# Patient Record
Sex: Male | Born: 1935
Health system: Southern US, Community
[De-identification: ages and names within clinical notes are randomized; demographics above are authoritative.]

## PROBLEM LIST (undated history)

## (undated) DIAGNOSIS — Z5189 Encounter for other specified aftercare: Secondary | ICD-10-CM

## (undated) DIAGNOSIS — K5790 Diverticulosis of intestine, part unspecified, without perforation or abscess without bleeding: Secondary | ICD-10-CM

## (undated) DIAGNOSIS — M1990 Unspecified osteoarthritis, unspecified site: Secondary | ICD-10-CM

## (undated) DIAGNOSIS — I10 Essential (primary) hypertension: Secondary | ICD-10-CM

## (undated) DIAGNOSIS — G20 Parkinson's disease: Secondary | ICD-10-CM

## (undated) DIAGNOSIS — K819 Cholecystitis, unspecified: Secondary | ICD-10-CM

## (undated) DIAGNOSIS — R600 Localized edema: Secondary | ICD-10-CM

## (undated) DIAGNOSIS — I2510 Atherosclerotic heart disease of native coronary artery without angina pectoris: Secondary | ICD-10-CM

## (undated) DIAGNOSIS — K319 Disease of stomach and duodenum, unspecified: Secondary | ICD-10-CM

## (undated) DIAGNOSIS — K219 Gastro-esophageal reflux disease without esophagitis: Secondary | ICD-10-CM

## (undated) DIAGNOSIS — R011 Cardiac murmur, unspecified: Secondary | ICD-10-CM

## (undated) DIAGNOSIS — T4145XA Adverse effect of unspecified anesthetic, initial encounter: Secondary | ICD-10-CM

## (undated) DIAGNOSIS — G709 Myoneural disorder, unspecified: Secondary | ICD-10-CM

## (undated) DIAGNOSIS — Z9861 Coronary angioplasty status: Secondary | ICD-10-CM

## (undated) DIAGNOSIS — R079 Chest pain, unspecified: Secondary | ICD-10-CM

## (undated) DIAGNOSIS — E785 Hyperlipidemia, unspecified: Secondary | ICD-10-CM

## (undated) DIAGNOSIS — K922 Gastrointestinal hemorrhage, unspecified: Secondary | ICD-10-CM

## (undated) DIAGNOSIS — K635 Polyp of colon: Secondary | ICD-10-CM

## (undated) DIAGNOSIS — Z9289 Personal history of other medical treatment: Secondary | ICD-10-CM

## (undated) DIAGNOSIS — D649 Anemia, unspecified: Secondary | ICD-10-CM

## (undated) DIAGNOSIS — IMO0001 Reserved for inherently not codable concepts without codable children: Secondary | ICD-10-CM

## (undated) DIAGNOSIS — N200 Calculus of kidney: Secondary | ICD-10-CM

## (undated) DIAGNOSIS — T8859XA Other complications of anesthesia, initial encounter: Secondary | ICD-10-CM

## (undated) DIAGNOSIS — R55 Syncope and collapse: Secondary | ICD-10-CM

## (undated) DIAGNOSIS — I219 Acute myocardial infarction, unspecified: Secondary | ICD-10-CM

## (undated) DIAGNOSIS — F419 Anxiety disorder, unspecified: Secondary | ICD-10-CM

## (undated) DIAGNOSIS — D126 Benign neoplasm of colon, unspecified: Secondary | ICD-10-CM

## (undated) DIAGNOSIS — G8929 Other chronic pain: Secondary | ICD-10-CM

## (undated) DIAGNOSIS — Z8619 Personal history of other infectious and parasitic diseases: Secondary | ICD-10-CM

## (undated) DIAGNOSIS — M549 Dorsalgia, unspecified: Secondary | ICD-10-CM

## (undated) DIAGNOSIS — R197 Diarrhea, unspecified: Secondary | ICD-10-CM

## (undated) DIAGNOSIS — G20C Parkinsonism, unspecified: Secondary | ICD-10-CM

## (undated) DIAGNOSIS — N189 Chronic kidney disease, unspecified: Secondary | ICD-10-CM

## (undated) HISTORY — DX: Cholecystitis, unspecified: K81.9

## (undated) HISTORY — DX: Unspecified osteoarthritis, unspecified site: M19.90

## (undated) HISTORY — DX: Acute myocardial infarction, unspecified: I21.9

## (undated) HISTORY — PX: COLON SURGERY: SHX602

## (undated) HISTORY — DX: Localized edema: R60.0

## (undated) HISTORY — DX: Personal history of other medical treatment: Z92.89

## (undated) HISTORY — DX: Hyperlipidemia, unspecified: E78.5

## (undated) HISTORY — DX: Parkinsonism, unspecified: G20.C

## (undated) HISTORY — DX: Benign neoplasm of colon, unspecified: D12.6

## (undated) HISTORY — DX: Parkinson's disease: G20

## (undated) HISTORY — DX: Personal history of other infectious and parasitic diseases: Z86.19

## (undated) HISTORY — DX: Diarrhea, unspecified: R19.7

## (undated) HISTORY — DX: Chest pain, unspecified: R07.9

## (undated) HISTORY — DX: Polyp of colon: K63.5

## (undated) HISTORY — DX: Chronic kidney disease, unspecified: N18.9

## (undated) HISTORY — DX: Calculus of kidney: N20.0

## (undated) HISTORY — DX: Disease of stomach and duodenum, unspecified: K31.9

## (undated) HISTORY — DX: Essential (primary) hypertension: I10

## (undated) HISTORY — DX: Diverticulosis of intestine, part unspecified, without perforation or abscess without bleeding: K57.90

---
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
## 1995-02-12 HISTORY — PX: BACK SURGERY: SHX140

## 1995-08-12 HISTORY — PX: POSTERIOR FUSION LUMBAR SPINE: SUR632

## 2000-05-13 DIAGNOSIS — C4492 Squamous cell carcinoma of skin, unspecified: Secondary | ICD-10-CM

## 2000-05-13 HISTORY — DX: Squamous cell carcinoma of skin, unspecified: C44.92

## 2002-09-20 ENCOUNTER — Inpatient Hospital Stay (HOSPITAL_COMMUNITY): Admission: EM | Admit: 2002-09-20 | Discharge: 2002-09-23 | Payer: Self-pay | Admitting: Internal Medicine

## 2002-09-20 ENCOUNTER — Encounter: Payer: Self-pay | Admitting: Internal Medicine

## 2002-10-15 ENCOUNTER — Encounter (INDEPENDENT_AMBULATORY_CARE_PROVIDER_SITE_OTHER): Payer: Self-pay | Admitting: *Deleted

## 2003-06-21 DIAGNOSIS — D229 Melanocytic nevi, unspecified: Secondary | ICD-10-CM

## 2003-06-21 HISTORY — DX: Melanocytic nevi, unspecified: D22.9

## 2003-09-05 ENCOUNTER — Ambulatory Visit (HOSPITAL_COMMUNITY): Admission: RE | Admit: 2003-09-05 | Discharge: 2003-09-05 | Payer: Self-pay | Admitting: Internal Medicine

## 2004-10-01 ENCOUNTER — Encounter: Payer: Self-pay | Admitting: Emergency Medicine

## 2004-10-02 ENCOUNTER — Encounter (INDEPENDENT_AMBULATORY_CARE_PROVIDER_SITE_OTHER): Payer: Self-pay | Admitting: Cardiology

## 2004-10-02 ENCOUNTER — Inpatient Hospital Stay (HOSPITAL_COMMUNITY): Admission: AD | Admit: 2004-10-02 | Discharge: 2004-10-03 | Payer: Self-pay | Admitting: *Deleted

## 2004-10-07 ENCOUNTER — Emergency Department (HOSPITAL_COMMUNITY): Admission: EM | Admit: 2004-10-07 | Discharge: 2004-10-07 | Payer: Self-pay | Admitting: Emergency Medicine

## 2004-11-14 ENCOUNTER — Inpatient Hospital Stay (HOSPITAL_COMMUNITY): Admission: EM | Admit: 2004-11-14 | Discharge: 2004-11-15 | Payer: Self-pay | Admitting: Emergency Medicine

## 2005-12-03 ENCOUNTER — Ambulatory Visit (HOSPITAL_COMMUNITY): Admission: RE | Admit: 2005-12-03 | Discharge: 2005-12-03 | Payer: Self-pay | Admitting: Ophthalmology

## 2005-12-05 ENCOUNTER — Inpatient Hospital Stay (HOSPITAL_COMMUNITY): Admission: EM | Admit: 2005-12-05 | Discharge: 2005-12-06 | Payer: Self-pay | Admitting: Emergency Medicine

## 2008-01-28 ENCOUNTER — Emergency Department (HOSPITAL_COMMUNITY): Admission: EM | Admit: 2008-01-28 | Discharge: 2008-01-28 | Payer: Self-pay | Admitting: Emergency Medicine

## 2008-10-31 ENCOUNTER — Ambulatory Visit: Payer: Self-pay | Admitting: Gastroenterology

## 2008-10-31 DIAGNOSIS — K5731 Diverticulosis of large intestine without perforation or abscess with bleeding: Secondary | ICD-10-CM

## 2008-10-31 DIAGNOSIS — Z860100 Personal history of colon polyps, unspecified: Secondary | ICD-10-CM | POA: Insufficient documentation

## 2008-10-31 DIAGNOSIS — K5730 Diverticulosis of large intestine without perforation or abscess without bleeding: Secondary | ICD-10-CM | POA: Insufficient documentation

## 2008-11-04 ENCOUNTER — Telehealth: Payer: Self-pay | Admitting: Gastroenterology

## 2008-11-04 ENCOUNTER — Encounter: Payer: Self-pay | Admitting: Gastroenterology

## 2008-11-04 ENCOUNTER — Ambulatory Visit: Payer: Self-pay | Admitting: Gastroenterology

## 2008-11-04 LAB — CONVERTED CEMR LAB: Fecal Occult Bld: NEGATIVE

## 2008-11-07 ENCOUNTER — Encounter: Payer: Self-pay | Admitting: Gastroenterology

## 2008-11-08 ENCOUNTER — Encounter: Payer: Self-pay | Admitting: Gastroenterology

## 2008-11-10 LAB — HM COLONOSCOPY

## 2009-01-05 ENCOUNTER — Inpatient Hospital Stay (HOSPITAL_COMMUNITY): Admission: EM | Admit: 2009-01-05 | Discharge: 2009-01-08 | Payer: Self-pay | Admitting: Emergency Medicine

## 2009-01-05 ENCOUNTER — Ambulatory Visit: Payer: Self-pay | Admitting: Internal Medicine

## 2009-01-10 ENCOUNTER — Ambulatory Visit: Payer: Self-pay | Admitting: Physician Assistant

## 2009-01-10 ENCOUNTER — Encounter: Payer: Self-pay | Admitting: Gastroenterology

## 2009-01-10 LAB — CONVERTED CEMR LAB
Basophils Relative: 0.9 % (ref 0.0–3.0)
Eosinophils Absolute: 0.3 10*3/uL (ref 0.0–0.7)
Hemoglobin: 9.2 g/dL — ABNORMAL LOW (ref 13.0–17.0)
Lymphocytes Relative: 13.1 % (ref 12.0–46.0)
MCHC: 33.9 g/dL (ref 30.0–36.0)
Monocytes Relative: 5.9 % (ref 3.0–12.0)
Neutro Abs: 7.6 10*3/uL (ref 1.4–7.7)
Neutrophils Relative %: 76.7 % (ref 43.0–77.0)
RBC: 2.98 M/uL — ABNORMAL LOW (ref 4.22–5.81)
WBC: 9.9 10*3/uL (ref 4.5–10.5)

## 2009-02-06 ENCOUNTER — Ambulatory Visit: Payer: Self-pay | Admitting: Gastroenterology

## 2009-02-07 LAB — CONVERTED CEMR LAB
Basophils Relative: 1 % (ref 0.0–3.0)
HCT: 36.7 % — ABNORMAL LOW (ref 39.0–52.0)
Hemoglobin: 12.3 g/dL — ABNORMAL LOW (ref 13.0–17.0)
Lymphocytes Relative: 11.9 % — ABNORMAL LOW (ref 12.0–46.0)
Lymphs Abs: 1.1 10*3/uL (ref 0.7–4.0)
MCHC: 33.6 g/dL (ref 30.0–36.0)
Monocytes Relative: 6.9 % (ref 3.0–12.0)
Neutro Abs: 7.4 10*3/uL (ref 1.4–7.7)
RBC: 4.08 M/uL — ABNORMAL LOW (ref 4.22–5.81)

## 2009-02-11 DIAGNOSIS — Z9861 Coronary angioplasty status: Secondary | ICD-10-CM

## 2009-02-11 HISTORY — DX: Coronary angioplasty status: Z98.61

## 2009-06-07 ENCOUNTER — Ambulatory Visit: Payer: Self-pay | Admitting: Internal Medicine

## 2009-06-07 ENCOUNTER — Inpatient Hospital Stay (HOSPITAL_COMMUNITY): Admission: AD | Admit: 2009-06-07 | Discharge: 2009-06-08 | Payer: Self-pay | Admitting: Gastroenterology

## 2009-06-07 ENCOUNTER — Telehealth: Payer: Self-pay | Admitting: Gastroenterology

## 2009-06-07 DIAGNOSIS — K625 Hemorrhage of anus and rectum: Secondary | ICD-10-CM | POA: Insufficient documentation

## 2009-06-07 DIAGNOSIS — I10 Essential (primary) hypertension: Secondary | ICD-10-CM | POA: Insufficient documentation

## 2009-07-06 ENCOUNTER — Ambulatory Visit: Payer: Self-pay | Admitting: Gastroenterology

## 2009-07-12 DIAGNOSIS — I219 Acute myocardial infarction, unspecified: Secondary | ICD-10-CM

## 2009-07-12 HISTORY — DX: Acute myocardial infarction, unspecified: I21.9

## 2009-08-09 ENCOUNTER — Inpatient Hospital Stay (HOSPITAL_COMMUNITY): Admission: EM | Admit: 2009-08-09 | Discharge: 2009-08-12 | Payer: Self-pay | Admitting: Emergency Medicine

## 2009-08-09 HISTORY — PX: CARDIAC CATHETERIZATION: SHX172

## 2009-08-19 ENCOUNTER — Ambulatory Visit: Payer: Self-pay | Admitting: Gastroenterology

## 2009-08-19 ENCOUNTER — Inpatient Hospital Stay (HOSPITAL_COMMUNITY): Admission: EM | Admit: 2009-08-19 | Discharge: 2009-08-20 | Payer: Self-pay | Admitting: Emergency Medicine

## 2009-08-21 ENCOUNTER — Inpatient Hospital Stay (HOSPITAL_COMMUNITY): Admission: EM | Admit: 2009-08-21 | Discharge: 2009-08-26 | Payer: Self-pay | Admitting: Emergency Medicine

## 2009-08-26 ENCOUNTER — Encounter (INDEPENDENT_AMBULATORY_CARE_PROVIDER_SITE_OTHER): Payer: Self-pay | Admitting: *Deleted

## 2009-08-28 ENCOUNTER — Encounter: Payer: Self-pay | Admitting: Gastroenterology

## 2009-08-28 ENCOUNTER — Telehealth: Payer: Self-pay | Admitting: Physician Assistant

## 2009-08-30 ENCOUNTER — Encounter: Admission: RE | Admit: 2009-08-30 | Discharge: 2009-08-30 | Payer: Self-pay | Admitting: Internal Medicine

## 2009-09-05 ENCOUNTER — Telehealth: Payer: Self-pay | Admitting: Gastroenterology

## 2009-09-14 ENCOUNTER — Encounter (HOSPITAL_COMMUNITY): Admission: RE | Admit: 2009-09-14 | Discharge: 2009-11-10 | Payer: Self-pay | Admitting: Cardiology

## 2009-10-26 ENCOUNTER — Ambulatory Visit: Payer: Self-pay | Admitting: Gastroenterology

## 2009-10-26 DIAGNOSIS — I2510 Atherosclerotic heart disease of native coronary artery without angina pectoris: Secondary | ICD-10-CM

## 2009-10-30 ENCOUNTER — Encounter: Payer: Self-pay | Admitting: Gastroenterology

## 2009-11-11 ENCOUNTER — Encounter (HOSPITAL_COMMUNITY)
Admission: RE | Admit: 2009-11-11 | Discharge: 2010-01-19 | Payer: Self-pay | Source: Home / Self Care | Attending: Cardiology | Admitting: Cardiology

## 2009-11-27 ENCOUNTER — Emergency Department (HOSPITAL_COMMUNITY): Admission: EM | Admit: 2009-11-27 | Discharge: 2009-11-27 | Payer: Self-pay | Admitting: Emergency Medicine

## 2009-12-04 ENCOUNTER — Inpatient Hospital Stay (HOSPITAL_COMMUNITY): Admission: EM | Admit: 2009-12-04 | Discharge: 2009-12-07 | Payer: Self-pay | Admitting: Emergency Medicine

## 2009-12-22 ENCOUNTER — Encounter: Payer: Self-pay | Admitting: Gastroenterology

## 2010-01-15 ENCOUNTER — Encounter: Payer: Self-pay | Admitting: Gastroenterology

## 2010-01-19 ENCOUNTER — Encounter
Admission: RE | Admit: 2010-01-19 | Discharge: 2010-01-19 | Payer: Self-pay | Source: Home / Self Care | Attending: General Surgery | Admitting: General Surgery

## 2010-01-24 ENCOUNTER — Inpatient Hospital Stay (HOSPITAL_COMMUNITY)
Admission: RE | Admit: 2010-01-24 | Discharge: 2010-01-31 | Payer: Self-pay | Source: Home / Self Care | Attending: General Surgery | Admitting: General Surgery

## 2010-01-24 ENCOUNTER — Encounter (INDEPENDENT_AMBULATORY_CARE_PROVIDER_SITE_OTHER): Payer: Self-pay | Admitting: General Surgery

## 2010-01-24 HISTORY — PX: SUBTOTAL COLECTOMY: SHX855

## 2010-02-09 ENCOUNTER — Encounter: Payer: Self-pay | Admitting: Gastroenterology

## 2010-03-09 ENCOUNTER — Encounter
Admission: RE | Admit: 2010-03-09 | Discharge: 2010-03-09 | Payer: Self-pay | Source: Home / Self Care | Attending: Cardiology | Admitting: Cardiology

## 2010-03-12 ENCOUNTER — Encounter: Payer: Self-pay | Admitting: Gastroenterology

## 2010-03-13 NOTE — Letter (Signed)
Summary: Out of Work  [facility]  [address] [place], [place] [address]   Phone: [phone]  Fax: [phone]    August 28, 2009   Employee:  RISHARD GOTTSCH Sp[employer]fore    To Whom It May Concern:   For Medical reasons, please excuse the above named employee from work for the following dates:  First admission to hospital: 08-19-09 and 08-20-09 Second admission to hospital: 08-21-09  thru and including 08-26-09.  Mr. Hovey may return to work on 09-04-2009.       If you need additional information, please feel free to contact our office.         Sincerely,       Dr. Herbie Baltimore Kaplan/ Amy Esterwood PA-C

## 2010-03-13 NOTE — Letter (Signed)
Summary: Gary [name]   Imported By: Bubba Hales 01/01/2010 08:32:45  _____________________________________________________________________  External Attachment:    Type:   Image     Comment:   External Document

## 2010-03-13 NOTE — Letter (Signed)
Summary: Results Letter  [facility]  [place], [place] [address]   Phone: [phone]  Fax: [phone]        November 07, 2008 MRN: [id number]    [place], [place]  [address]    Dear Mr. GRIFFO,  Your hemeoccult cards testing for microscopic bleeding were negative.  No further GI workup is required at this time.   Please continue with the recommendations that we previously discussed. Should you have any further questions or concerns, feel free to contact me.    Sincerely,  Sandy Salaam. Deatra Ina, M.D., [facility]         Sincerely,  Inda Castle MD  This letter has been electronically signed by your physician.  Appended Document: Results Letter letter mailed

## 2010-03-13 NOTE — Assessment & Plan Note (Signed)
Summary: hosp. f/u--ch.   History of Present Illness Visit Type: Follow-up Visit Primary GI MD: Erskine Emery MD [facility] Primary Provider: Levin Erp, MD Requesting Provider: n/a Chief Complaint: Hosp f/u. Pt denies any GI complaints History of Present Illness:   Mr. Diberardino has returned following hospitalization for limited lower GI bleed.  Although he has diffuse diverticulosis it was not certain whether he bled from a diverticulum since he appeared to have limited very distal rectal bleeding.  He currently is feeling well and is without GI complaints.  With daily Metamucil he is having regular bowel movements.   GI Review of Systems      Denies abdominal pain, acid reflux, belching, bloating, chest pain, dysphagia with liquids, dysphagia with solids, heartburn, loss of appetite, nausea, vomiting, vomiting blood, weight loss, and  weight gain.        Denies anal fissure, black tarry stools, change in bowel habit, constipation, diarrhea, diverticulosis, fecal incontinence, heme positive stool, hemorrhoids, irritable bowel syndrome, jaundice, light color stool, liver problems, rectal bleeding, and  rectal pain.    Current Medications (verified): 1)  Doxazosin Mesylate 8 Mg Tabs (Doxazosin Mesylate) .... Once Daily 2)  Fexofenadine Hcl 180 Mg Tabs (Fexofenadine Hcl) .... Once Daily 3)  Lisinopril-Hydrochlorothiazide 10-12.5 Mg Tabs (Lisinopril-Hydrochlorothiazide) .... Once Daily 4)  Flonase 50 Mcg/act Susp (Fluticasone Propionate) .... 2 Puffs Daily 5)  Gabapentin 400 Mg Caps (Gabapentin) .... Three Times A Day 6)  Tramadol-Acetaminophen 37.5-325 Mg Tabs (Tramadol-Acetaminophen) .... Take 2 Tablets Three Times A Day 7)  Tylenol Extra Strength 500 Mg Tabs (Acetaminophen) .... As Needed 8)  Androgel 25 Mg/2.5gm Gel (Testosterone) .... Once Daily 9)  Metamucil 30.9 % Powd (Psyllium) .... Two Times A Day  Allergies (verified): 1)  ! Naprosyn  Past History:  Past Medical  History: Reviewed history from 06/07/2009 and no changes required. Arthritis Adenomatous Colon Polyps-LAST COLON 9/10 Diverticulosis-DIFFUSE,WITH RECURRENT DIVERTICULAR BLEEDING Hypertension Kidney Stones Urinary Tract Infection  Past Surgical History: Reviewed history from 10/31/2008 and no changes required. Back Surgery Eye Surgery(corrective @ age 50)  Family History: Reviewed history from 10/31/2008 and no changes required. No FH of Colon Cancer: Family History of Pancreatic Cancer:Mother  Social History: Reviewed history from 06/07/2009 and no changes required. 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  Alcohol Use - no Daily Caffeine Use Illicit Drug Use - no  Review of Systems       The patient complains of allergy/sinus.  The patient denies anemia, anxiety-new, arthritis/joint pain, back pain, blood in urine, breast changes/lumps, change in vision, confusion, cough, coughing up blood, depression-new, fainting, fatigue, fever, headaches-new, hearing problems, heart murmur, heart rhythm changes, itching, muscle pains/cramps, night sweats, nosebleeds, shortness of breath, skin rash, sleeping problems, sore throat, swelling of feet/legs, swollen lymph glands, thirst - excessive, urination - excessive, urination changes/pain, urine leakage, vision changes, and voice change.    Vital Signs:  Patient profile:   77 year old male Height:      69 inches Weight:      205 pounds BMI:     30.38 BSA:     2.09 Pulse rate:   84 / minute Pulse rhythm:   regular BP sitting:   132 / 64  (left arm) Cuff size:   regular  Vitals Entered By: Hope Pigeon CMA (Jul 06, 2009 10:11 AM)   Impression & Recommendations:  Problem # 1:  RECTAL BLEEDING (ICD-569.3) He may have had a very limited diverticular bleed.  Alternatively, this  may have been  due to hemorrhoids.  He  does have a history of diverticular bleeding in November, 2010 and 2004.  Recommendations #1 continue  fiber supplementation #2 elective subtotal colectomy has been discussed as a possibility if he has documented recurrent diverticula bleeding.  This point therapy will be expectant  Patient Instructions: 1)  Copy sent to : Christean Grief Green,MD 2)  The medication list was reviewed and reconciled.  All changed / newly prescribed medications were explained.  A complete medication list was provided to the patient / caregiver.

## 2010-03-13 NOTE — Assessment & Plan Note (Signed)
Summary: F/U--CH.   History of Present Illness Visit Type: Follow-up Visit Primary GI MD: Erskine Emery MD [facility] Primary Provider: Levin Erp, MD Requesting Provider: n/a Chief Complaint: F/u for diverticular bleed.  Pt denies any GI complaints  History of Present Illness:   Mr. Blount has returned for followup of his lower GI bleed.  His last admission in July for acute diverticular bleedingbleeding  Iin July was complicated by a myocardial infarction.  He has severe diverticular disease in the sigmoid and descending colon.  Since discharge he has felt well and had no further GI events.  He is undergoing cardiac rehab.   He has no specific complaints.   GI Review of Systems      Denies abdominal pain, acid reflux, belching, bloating, chest pain, dysphagia with liquids, dysphagia with solids, heartburn, loss of appetite, nausea, vomiting, vomiting blood, weight loss, and  weight gain.      Reports diverticulosis.     Denies anal fissure, black tarry stools, change in bowel habit, constipation, diarrhea, fecal incontinence, heme positive stool, hemorrhoids, irritable bowel syndrome, jaundice, light color stool, liver problems, rectal bleeding, and  rectal pain.    Current Medications (verified): 1)  Doxazosin Mesylate 8 Mg Tabs (Doxazosin Mesylate) .... Once Daily 2)  Fexofenadine Hcl 180 Mg Tabs (Fexofenadine Hcl) .... Once Daily 3)  Lisinopril-Hydrochlorothiazide 10-12.5 Mg Tabs (Lisinopril-Hydrochlorothiazide) .... Once Daily 4)  Flonase 50 Mcg/act Susp (Fluticasone Propionate) .... 2 Puffs Daily 5)  Gabapentin 400 Mg Caps (Gabapentin) .... Three Times A Day 6)  Tramadol-Acetaminophen 37.5-325 Mg Tabs (Tramadol-Acetaminophen) .... Take 2 Tablets Three Times A Day 7)  Tylenol Extra Strength 500 Mg Tabs (Acetaminophen) .... As Needed 8)  Benefiber  Powd (Wheat Dextrin) .... Once Daily May Use Two Times A Day 9)  Ferrous Sulfate 325 (65 Fe) Mg Tabs (Ferrous Sulfate) .... One Tablet By  Mouth Once Daily 10)  Lorazepam 0.5 Mg Tabs (Lorazepam) .... Two Times A Day As Needed 11)  Hydrocodone-Acetaminophen 5-325 Mg Tabs (Hydrocodone-Acetaminophen) .... Every 6 Hours As Needed 12)  Simvastatin 40 Mg Tabs (Simvastatin) .... One Tablet By Mouth Once Daily 13)  Nitrostat 0.4 Mg Subl (Nitroglycerin) .... As Needed  Allergies (verified): 1)  ! Naprosyn 2)  ! Sulfa 3)  ! * Plavix  Past History:  Past Medical History: Arthritis Adenomatous Colon Polyps-LAST COLON 9/10 Diverticulosis-DIFFUSE,WITH RECURRENT DIVERTICULAR BLEEDING Hypertension Kidney Stones Urinary Tract Infection Myocardial Infarction--June 2011  Chronic Kidney Disease  Dyslipedemia   Past Surgical History: Reviewed history from 10/31/2008 and no changes required. Back Surgery Eye Surgery(corrective @ age 21)  Family History: Reviewed history from 10/31/2008 and no changes required. No FH of Colon Cancer: Family History of Pancreatic Cancer:Mother  Social History: Reviewed history from 06/07/2009 and no changes required. 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  Alcohol Use - no Daily Caffeine Use Illicit Drug Use - no  Review of Systems       The patient complains of fatigue.  The patient denies allergy/sinus, anemia, anxiety-new, arthritis/joint pain, back pain, blood in urine, breast changes/lumps, change in vision, confusion, cough, coughing up blood, depression-new, fainting, fever, headaches-new, hearing problems, heart murmur, heart rhythm changes, itching, muscle pains/cramps, night sweats, nosebleeds, shortness of breath, skin rash, sleeping problems, sore throat, swelling of feet/legs, swollen lymph glands, thirst - excessive, urination - excessive, urination changes/pain, urine leakage, vision changes, and voice change.         All other systems were reviewed and were negative   Vital Signs:  Patient profile:   77 year old male Height:      69 inches Weight:      206  pounds BMI:     30.53 BSA:     2.09 Pulse rate:   68 / minute Pulse rhythm:   regular BP sitting:   124 / 68  (left arm) Cuff size:   regular  Vitals Entered By: Hope Pigeon CMA (October 26, 2009 8:49 AM)   Impression & Recommendations:  Problem # 1:  Hx of DIVERTICULOSIS, COLON, WITH HEMORRHAGE (ICD-562.12)  He has had 3 diverticular bleeds in the last 6 months.  Elective left subtotal colectomy has been discussed, deferred because of acute MI in July, 2011.  Recommendations #1 referred to general surgery, Dr. Fanny Skates, for consideration of left segmental colectomy next 2-3 months  Orders: [facility] ([facility])  Problem # 2:  CAD (ICD-414.00) Status post MI in July, 2011.  He has had stable cardiac function since that time  Problem # 3:  PERSONAL HISTORY OF COLONIC POLYPS (ICD-V12.72) Last colonoscopy 2010 demonstrating an adenomatous polyp.  Then follow up colonoscopy in 2015.  Patient Instructions: 1)  Copy sent to : Levin Erp, MD, Fanny Skates, MD 2)  Your appointment with Dr Dalbert Batman is scheduled on 10/30/2009 at 2:30pm 3)  The medication list was reviewed and reconciled.  All changed / newly prescribed medications were explained.  A complete medication list was provided to the patient / caregiver.

## 2010-03-13 NOTE — Progress Notes (Signed)
Summary: Condition Update  Phone Note Call from Patient Call back at [phone]   Caller: Patient Call For: Dr. Deatra Ina Reason for Call: Talk to Nurse Summary of Call: pt. went to his PCP last week and his hemoglobin was 11.5.Marland KitchenMarland KitchenMarland Kitchenwould like to discuss Initial call taken by: Webb Laws,  September 05, 2009 10:48 AM  Follow-up for Phone Call        Pt. wants to update Dr.Kaplan on his most recent lab.  Pt. feels well and is back to work.  No c/o.  Pt. wants to know how often he should get his labs checked? Follow-up by: Vivia Ewing LPN,  July 26, 624THL [id number] AM  Additional Follow-up for Phone Call Additional follow up Details #1::        He does not need it routinely in the absence of overt bleeding Additional Follow-up by: Inda Castle MD,  September 05, 2009 1:55 PM    Additional Follow-up for Phone Call Additional follow up Details #2::    Above MD orders reviewed with patient. Pt. instructed to call back as needed.  Follow-up by: Vivia Ewing LPN,  July 26, 624THL [id number] PM

## 2010-03-13 NOTE — Consult Note (Signed)
Summary: [facility]   Imported By: Bubba Hales 11/08/2009 11:55:48  _____________________________________________________________________  External Attachment:    Type:   Image     Comment:   External Document

## 2010-03-13 NOTE — Progress Notes (Signed)
Summary: Note to return to work  Phone Note Call from Patient Call back at [employer] [phone]   Call For: Erin Fulling Summary of Call: Tells me that when Ainslee Sou discharged him from the hospital last week, she promised to leave a note for him saying he can go back to work. Hospital never gave to him and he is wondering how he can go about obtaining it? Initial call taken by: Irwin Brakeman [facility],  August 28, 2009 11:16 AM  Follow-up for Phone Call        Spoke to pt and apologized from [facility] PA that she did not get him his work note advising when he could return back to work.  I advised the letter will be at our front desk for him.  He said he would come in tomorrow the 19th. He said Airam Runions and Dr. Deatra Ina took wonderful care of him in the hospital.  Follow-up by: Sharol Roussel,  August 28, 2009 3:56 PM

## 2010-03-13 NOTE — Initial Assessments (Signed)
Summary: ? DIVERTICULITIS FLARE           (DR.KAPLAN PT.)    DEBORAH   History of Present Illness Visit Type: Follow-up Visit Primary GI MD: Erskine Emery MD [facility] Primary Provider: Levin Erp, MD Requesting Provider: n/a Chief Complaint: diverticular disease flare starting History of Present Illness:   PLEASANT 76 Y.O MALE KNOWN TO DR. KAPLAN WITH HX OF RECURRENT DIVERTICULAR BLEEDING AND DIFFUSE DIVERTICULOSIS,AS WELL AS ADENOMATOUS POLYPS. HE WAS LAST HOSPITALIZED IN DEC 2010 WITH A BLEED AND DID REQUIRE TRANSFUSIONS. COLONOSCOPY WAS DONE IN 9/10 WITH ONE SMALL POLYP , DIFFUSE DIVERTICULOSIS-MORE SEVERE IN THE LEFT COLON. PT COMES IN TODAY AS AN ACUTE ADD-ON C/O" FLARE".HE IS NOT HAVING ANY ABDOMINAL PAIN. HE STARTED AT 11 AM TODAY WITH A GROSSLY BLOODY STOOL. HE HAD A SECOND EPISODE ABOUT ONE PM.HE FEELS A LITTLE WEAK,AND UNSETTLED IN HIS ABDOMEN. NO ASA OR THINNERS.   GI Review of Systems      Denies abdominal pain, acid reflux, belching, bloating, chest pain, dysphagia with liquids, dysphagia with solids, heartburn, loss of appetite, nausea, vomiting, vomiting blood, and  weight loss.      Reports change in bowel habits, diverticulosis, and  rectal bleeding.     Denies anal fissure, black tarry stools, constipation, diarrhea, fecal incontinence, heme positive stool, hemorrhoids, irritable bowel syndrome, jaundice, light color stool, liver problems, and  rectal pain.    Current Medications (verified): 1)  Doxazosin Mesylate 8 Mg Tabs (Doxazosin Mesylate) .... Once Daily 2)  Fexofenadine Hcl 180 Mg Tabs (Fexofenadine Hcl) .... Once Daily 3)  Lisinopril-Hydrochlorothiazide 10-12.5 Mg Tabs (Lisinopril-Hydrochlorothiazide) .... Once Daily 4)  Flonase 50 Mcg/act Susp (Fluticasone Propionate) .... 2 Puffs Daily 5)  Gabapentin 400 Mg Caps (Gabapentin) .... Three Times A Day 6)  Tramadol-Acetaminophen 37.5-325 Mg Tabs (Tramadol-Acetaminophen) .... Take 2 Tablets Three Times A Day 7)   Tylenol Extra Strength 500 Mg Tabs (Acetaminophen) .... As Needed 8)  Androgel 25 Mg/2.5gm Gel (Testosterone) .... Once Daily  Allergies (verified): 1)  ! Naprosyn  Past History:  Past Medical History: Arthritis Adenomatous Colon Polyps-LAST COLON 9/10 Diverticulosis-DIFFUSE,WITH RECURRENT DIVERTICULAR BLEEDING Hypertension Kidney Stones Urinary Tract Infection  Past Surgical History: Reviewed history from 10/31/2008 and no changes required. Back Surgery Eye Surgery(corrective @ age 35)  Family History: Reviewed history from 10/31/2008 and no changes required. No FH of Colon Cancer: Family History of Pancreatic Cancer:Mother  Social History: Reviewed history from 10/31/2008 and no changes required. 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  Alcohol Use - no Daily Caffeine Use Illicit Drug Use - no  Review of Systems       The patient complains of arthritis/joint pain, back pain, and fatigue.  The patient denies allergy/sinus, anemia, anxiety-new, blood in urine, breast changes/lumps, change in vision, confusion, cough, coughing up blood, depression-new, fainting, fever, headaches-new, hearing problems, heart murmur, heart rhythm changes, itching, menstrual pain, muscle pains/cramps, night sweats, nosebleeds, pregnancy symptoms, shortness of breath, skin rash, sleeping problems, sore throat, swelling of feet/legs, swollen lymph glands, thirst - excessive , urination - excessive , urination changes/pain, urine leakage, vision changes, and voice change.         ROS OTHERWISE AS IN HPI  Vital Signs:  Patient profile:   77 year old male Height:      69 inches Weight:      205.50 pounds BMI:     30.46 Pulse rate:   80 / minute Pulse rhythm:   regular BP sitting:   126 /  64  (left arm) Cuff size:   regular  Vitals Entered By: June McMurray [place] Deborra Medina) (June 07, 2009 3:28 PM)  Physical Exam  General:  Well developed, well nourished, no acute  distress. Head:  Normocephalic and atraumatic. Eyes:  PERRLA, no icterus. Lungs:  Clear throughout to auscultation. Heart:  Regular rate and rhythm; no murmurs, rubs,  or bruits. Abdomen:  SOFT, NONTENDER, NO MASS OR HSM,BS +ACTIVE Rectal:  GROSS RED BLOOD IN RECTAL VAULT Extremities:  No clubbing, cyanosis, edema or deformities noted. Neurologic:  Alert and  oriented x4;  grossly normal neurologically. Psych:  Alert and cooperative. Normal mood and affect.   Impression & Recommendations:  Problem # 1:  RECTAL BLEEDING (ICD-569.3) Assessment Deteriorated 76 Y.O MALE WITH HX OF RECURRENT DIVERTICULAR BLEEDING WITH ONSET OF RECTAL BLEEDING TODAY-CONSISTENT WITH DIVERTICULAR BLEED,HEMODYNAMICALLY STABLE AT PRESENT. DIFFUSE DIVERTICULOSIS  ADMIT TO [place] FOR CAREFUL OBSERVATION,SERIAL CBC'C,TRANSFUSIONS AS INDICATED,BOWEL REST  MAY NEED TO CONSIDER SUBTOTAL COLECTOMY  Problem # 2:  HYPERTENSION (ICD-401.9) Assessment: Comment Only  Problem # 3:  PERSONAL HISTORY OF COLONIC POLYPS (ICD-V12.72) Assessment: Comment Only ADENOMATOUS

## 2010-03-13 NOTE — Progress Notes (Signed)
Summary: Diverticulitis flare  Phone Note Call from Patient Call back at Home Phone [phone]   Caller: Patient Summary of Call: Pt complains of rectal bleeding that started today, diarrhea today, and some light abdominal pain. He states that he can tell is going into a diverticulitis flare and would like to be seen or treated for his flare.  Initial call taken by: Bernita Buffy CMA Deborra Medina),  June 07, 2009 2:41 PM  Follow-up for Phone Call        Pt. advised to come directly to the office to see Nicoletta Ba [facility] at 3:30pm. Follow-up by: Vivia Ewing LPN,  April 27, 624THL 2:52 PM

## 2010-03-15 NOTE — Letter (Signed)
Summary: [facility] By: Rise Patience 01/24/2010 16:07:33  _____________________________________________________________________  External Attachment:    Type:   Image     Comment:   External Document

## 2010-03-15 NOTE — Letter (Signed)
Summary: [facility]   Imported By: Phillis Knack 01/29/2010 14:09:25  _____________________________________________________________________  External Attachment:    Type:   Image     Comment:   External Document

## 2010-03-15 NOTE — Letter (Signed)
Summary: Fanny Skates MD  Fanny Skates MD   Imported By: Bubba Hales 02/27/2010 07:30:07  _____________________________________________________________________  External Attachment:    Type:   Image     Comment:   External Document

## 2010-03-21 ENCOUNTER — Other Ambulatory Visit: Payer: Self-pay | Admitting: Dermatology

## 2010-04-04 NOTE — Letter (Signed)
Summary: [facility]   Imported By: Rise Patience 03/29/2010 10:52:35  _____________________________________________________________________  External Attachment:    Type:   Image     Comment:   External Document

## 2010-04-23 LAB — CROSSMATCH: Unit division: 0

## 2010-04-23 LAB — CBC
HCT: 29.9 % — ABNORMAL LOW (ref 39.0–52.0)
HCT: 33.5 % — ABNORMAL LOW (ref 39.0–52.0)
Hemoglobin: 13.4 g/dL (ref 13.0–17.0)
MCH: 28.9 pg (ref 26.0–34.0)
MCH: 29.9 pg (ref 26.0–34.0)
MCH: 29.4 pg (ref 26.0–34.0)
MCHC: 31.8 g/dL (ref 30.0–36.0)
MCHC: 33.4 g/dL (ref 30.0–36.0)
MCV: 89.3 fL (ref 78.0–100.0)
MCV: 90.7 fL (ref 78.0–100.0)
Platelets: 155 10*3/uL (ref 150–400)
Platelets: 164 10*3/uL (ref 150–400)
RBC: 4.56 MIL/uL (ref 4.22–5.81)
RDW: 13.3 % (ref 11.5–15.5)
RDW: 13.9 % (ref 11.5–15.5)
RDW: 13.9 % (ref 11.5–15.5)
RDW: 14.1 % (ref 11.5–15.5)
WBC: 10.2 10*3/uL (ref 4.0–10.5)
WBC: 7.3 10*3/uL (ref 4.0–10.5)

## 2010-04-23 LAB — COMPREHENSIVE METABOLIC PANEL
ALT: 15 U/L (ref 0–53)
AST: 23 U/L (ref 0–37)
Alkaline Phosphatase: 26 U/L — ABNORMAL LOW (ref 39–117)
CO2: 26 mEq/L (ref 19–32)
Chloride: 103 mEq/L (ref 96–112)
Creatinine, Ser: 1.49 mg/dL (ref 0.4–1.5)
GFR calc Af Amer: 56 mL/min — ABNORMAL LOW (ref >60)
GFR calc non Af Amer: 46 mL/min — ABNORMAL LOW (ref >60)
Sodium: 134 mEq/L — ABNORMAL LOW (ref 135–145)
Total Bilirubin: 0.8 mg/dL (ref 0.3–1.2)

## 2010-04-23 LAB — URINALYSIS, ROUTINE W REFLEX MICROSCOPIC
Bilirubin Urine: NEGATIVE
Glucose, UA: NEGATIVE mg/dL
Ketones, ur: NEGATIVE mg/dL
pH: 6.5 (ref 5.0–8.0)

## 2010-04-23 LAB — BASIC METABOLIC PANEL
BUN: 15 mg/dL (ref 6–23)
BUN: 8 mg/dL (ref 6–23)
Chloride: 103 mEq/L (ref 96–112)
Creatinine, Ser: 1.38 mg/dL (ref 0.4–1.5)
GFR calc Af Amer: >60 mL/min (ref >60)
GFR calc non Af Amer: >60 mL/min (ref >60)
Glucose, Bld: 159 mg/dL — ABNORMAL HIGH (ref 70–99)
Potassium: 4.4 mEq/L (ref 3.5–5.1)
Potassium: 4.4 mEq/L (ref 3.5–5.1)
Sodium: 131 mEq/L — ABNORMAL LOW (ref 135–145)

## 2010-04-23 LAB — DIFFERENTIAL
Basophils Absolute: 0 10*3/uL (ref 0.0–0.1)
Eosinophils Absolute: 0.2 10*3/uL (ref 0.0–0.7)
Eosinophils Relative: 3 % (ref 0–5)

## 2010-04-23 LAB — SURGICAL PCR SCREEN: Staphylococcus aureus: POSITIVE — AB

## 2010-04-23 LAB — PROTIME-INR: INR: 1.08 (ref 0.00–1.49)

## 2010-04-25 LAB — CBC
HCT: 27.3 % — ABNORMAL LOW (ref 39.0–52.0)
HCT: 28.2 % — ABNORMAL LOW (ref 39.0–52.0)
HCT: 30.6 % — ABNORMAL LOW (ref 39.0–52.0)
HCT: 32.1 % — ABNORMAL LOW (ref 39.0–52.0)
HCT: 32.6 % — ABNORMAL LOW (ref 39.0–52.0)
HCT: 40.2 % (ref 39.0–52.0)
HCT: 43.4 % (ref 39.0–52.0)
Hemoglobin: 10.5 g/dL — ABNORMAL LOW (ref 13.0–17.0)
Hemoglobin: 12.9 g/dL — ABNORMAL LOW (ref 13.0–17.0)
Hemoglobin: 14.3 g/dL (ref 13.0–17.0)
Hemoglobin: 9.2 g/dL — ABNORMAL LOW (ref 13.0–17.0)
Hemoglobin: 9.4 g/dL — ABNORMAL LOW (ref 13.0–17.0)
Hemoglobin: 9.9 g/dL — ABNORMAL LOW (ref 13.0–17.0)
Hemoglobin: 9.9 g/dL — ABNORMAL LOW (ref 13.0–17.0)
MCH: 27.7 pg (ref 26.0–34.0)
MCH: 28.4 pg (ref 26.0–34.0)
MCH: 28.8 pg (ref 26.0–34.0)
MCH: 28.9 pg (ref 26.0–34.0)
MCHC: 32.1 g/dL (ref 30.0–36.0)
MCHC: 32.7 g/dL (ref 30.0–36.0)
MCHC: 32.9 g/dL (ref 30.0–36.0)
MCHC: 33.4 g/dL (ref 30.0–36.0)
MCV: 86.4 fL (ref 78.0–100.0)
MCV: 86.5 fL (ref 78.0–100.0)
MCV: 87.7 fL (ref 78.0–100.0)
MCV: 87.9 fL (ref 78.0–100.0)
Platelets: 133 10*3/uL — ABNORMAL LOW (ref 150–400)
Platelets: 136 10*3/uL — ABNORMAL LOW (ref 150–400)
Platelets: 144 10*3/uL — ABNORMAL LOW (ref 150–400)
Platelets: 165 10*3/uL (ref 150–400)
Platelets: 174 10*3/uL (ref 150–400)
RBC: 3.16 MIL/uL — ABNORMAL LOW (ref 4.22–5.81)
RBC: 3.43 MIL/uL — ABNORMAL LOW (ref 4.22–5.81)
RBC: 3.49 MIL/uL — ABNORMAL LOW (ref 4.22–5.81)
RBC: 3.59 MIL/uL — ABNORMAL LOW (ref 4.22–5.81)
RDW: 13.8 % (ref 11.5–15.5)
RDW: 14 % (ref 11.5–15.5)
RDW: 14 % (ref 11.5–15.5)
RDW: 14.2 % (ref 11.5–15.5)
RDW: 14.2 % (ref 11.5–15.5)
WBC: 4.9 10*3/uL (ref 4.0–10.5)
WBC: 5.9 10*3/uL (ref 4.0–10.5)
WBC: 6.4 10*3/uL (ref 4.0–10.5)
WBC: 6.5 10*3/uL (ref 4.0–10.5)
WBC: 6.8 10*3/uL (ref 4.0–10.5)
WBC: 7 10*3/uL (ref 4.0–10.5)
WBC: 7.2 10*3/uL (ref 4.0–10.5)
WBC: 7.4 10*3/uL (ref 4.0–10.5)

## 2010-04-25 LAB — TYPE AND SCREEN: ABO/RH(D): A POS

## 2010-04-25 LAB — COMPREHENSIVE METABOLIC PANEL
ALT: 15 U/L (ref 0–53)
BUN: 26 mg/dL — ABNORMAL HIGH (ref 6–23)
CO2: 27 mEq/L (ref 19–32)
CO2: 32 mEq/L (ref 19–32)
Calcium: 9.5 mg/dL (ref 8.4–10.5)
Calcium: 9.6 mg/dL (ref 8.4–10.5)
Chloride: 102 mEq/L (ref 96–112)
Creatinine, Ser: 1.52 mg/dL — ABNORMAL HIGH (ref 0.4–1.5)
Creatinine, Ser: 1.62 mg/dL — ABNORMAL HIGH (ref 0.4–1.5)
GFR calc non Af Amer: 42 mL/min — ABNORMAL LOW (ref >60)
GFR calc non Af Amer: 45 mL/min — ABNORMAL LOW (ref >60)
Glucose, Bld: 130 mg/dL — ABNORMAL HIGH (ref 70–99)
Glucose, Bld: 99 mg/dL (ref 70–99)
Sodium: 139 mEq/L (ref 135–145)
Total Bilirubin: 0.6 mg/dL (ref 0.3–1.2)
Total Protein: 6.6 g/dL (ref 6.0–8.3)

## 2010-04-25 LAB — BASIC METABOLIC PANEL
BUN: 28 mg/dL — ABNORMAL HIGH (ref 6–23)
CO2: 27 mEq/L (ref 19–32)
Calcium: 8.9 mg/dL (ref 8.4–10.5)
Chloride: 105 mEq/L (ref 96–112)
Creatinine, Ser: 1.54 mg/dL — ABNORMAL HIGH (ref 0.4–1.5)
GFR calc Af Amer: 54 mL/min — ABNORMAL LOW (ref >60)
GFR calc non Af Amer: 47 mL/min — ABNORMAL LOW (ref >60)
Glucose, Bld: 111 mg/dL — ABNORMAL HIGH (ref 70–99)
Potassium: 4 mEq/L (ref 3.5–5.1)
Potassium: 4.3 mEq/L (ref 3.5–5.1)
Sodium: 139 mEq/L (ref 135–145)

## 2010-04-25 LAB — DIFFERENTIAL
Basophils Absolute: 0 10*3/uL (ref 0.0–0.1)
Basophils Absolute: 0 10*3/uL (ref 0.0–0.1)
Basophils Relative: 0 % (ref 0–1)
Eosinophils Absolute: 0.1 10*3/uL (ref 0.0–0.7)
Eosinophils Absolute: 0.2 10*3/uL (ref 0.0–0.7)
Eosinophils Relative: 4 % (ref 0–5)
Lymphocytes Relative: 19 % (ref 12–46)
Lymphs Abs: 1 10*3/uL (ref 0.7–4.0)
Lymphs Abs: 1 10*3/uL (ref 0.7–4.0)
Monocytes Relative: 8 % (ref 3–12)
Neutro Abs: 3.3 10*3/uL (ref 1.7–7.7)
Neutro Abs: 5.4 10*3/uL (ref 1.7–7.7)
Neutrophils Relative %: 77 % (ref 43–77)
Neutrophils Relative %: 78 % — ABNORMAL HIGH (ref 43–77)

## 2010-04-25 LAB — PROTIME-INR
INR: 1.02 (ref 0.00–1.49)
Prothrombin Time: 13.6 seconds (ref 11.6–15.2)

## 2010-04-25 LAB — POCT CARDIAC MARKERS: Troponin i, poc: <0.05 ng/mL (ref 0.00–0.09)

## 2010-04-25 LAB — LIPASE, BLOOD: Lipase: 44 U/L (ref 11–59)

## 2010-04-28 LAB — CROSSMATCH
ABO/RH(D): A POS
Antibody Screen: NEGATIVE

## 2010-04-28 LAB — HEMOGLOBIN AND HEMATOCRIT, BLOOD: HCT: 29.4 % — ABNORMAL LOW (ref 39.0–52.0)

## 2010-04-28 LAB — URINE CULTURE
Colony Count: NO GROWTH
Culture: NO GROWTH

## 2010-04-28 LAB — URINALYSIS, ROUTINE W REFLEX MICROSCOPIC
Bilirubin Urine: NEGATIVE
Glucose, UA: NEGATIVE mg/dL
Hgb urine dipstick: NEGATIVE
Ketones, ur: NEGATIVE mg/dL
pH: 6.5 (ref 5.0–8.0)

## 2010-04-28 LAB — CBC
HCT: 31.9 % — ABNORMAL LOW (ref 39.0–52.0)
MCHC: 33.5 g/dL (ref 30.0–36.0)
RDW: 15.3 % (ref 11.5–15.5)

## 2010-04-29 LAB — BASIC METABOLIC PANEL
BUN: 15 mg/dL (ref 6–23)
BUN: 16 mg/dL (ref 6–23)
BUN: 16 mg/dL (ref 6–23)
BUN: 22 mg/dL (ref 6–23)
BUN: 12 mg/dL (ref 6–23)
CO2: 26 mEq/L (ref 19–32)
CO2: 26 mEq/L (ref 19–32)
CO2: 26 mEq/L (ref 19–32)
CO2: 28 mEq/L (ref 19–32)
Calcium: 8.1 mg/dL — ABNORMAL LOW (ref 8.4–10.5)
Calcium: 8.2 mg/dL — ABNORMAL LOW (ref 8.4–10.5)
Calcium: 8.9 mg/dL (ref 8.4–10.5)
Chloride: 111 mEq/L (ref 96–112)
Chloride: 103 mEq/L (ref 96–112)
Chloride: 99 mEq/L (ref 96–112)
Chloride: 103 mEq/L (ref 96–112)
Creatinine, Ser: 1.3 mg/dL (ref 0.4–1.5)
Creatinine, Ser: 1.34 mg/dL (ref 0.4–1.5)
Creatinine, Ser: 1.58 mg/dL — ABNORMAL HIGH (ref 0.4–1.5)
Creatinine, Ser: 1.16 mg/dL (ref 0.4–1.5)
Creatinine, Ser: 1.44 mg/dL (ref 0.4–1.5)
GFR calc Af Amer: >60 mL/min (ref >60)
GFR calc Af Amer: >60 mL/min (ref >60)
GFR calc Af Amer: >60 mL/min (ref >60)
GFR calc Af Amer: 58 mL/min — ABNORMAL LOW (ref >60)
GFR calc non Af Amer: 52 mL/min — ABNORMAL LOW (ref >60)
GFR calc non Af Amer: 53 mL/min — ABNORMAL LOW (ref >60)
GFR calc non Af Amer: 54 mL/min — ABNORMAL LOW (ref >60)
GFR calc non Af Amer: 55 mL/min — ABNORMAL LOW (ref >60)
GFR calc non Af Amer: >60 mL/min (ref >60)
Glucose, Bld: 102 mg/dL — ABNORMAL HIGH (ref 70–99)
Glucose, Bld: 103 mg/dL — ABNORMAL HIGH (ref 70–99)
Glucose, Bld: 110 mg/dL — ABNORMAL HIGH (ref 70–99)
Glucose, Bld: 114 mg/dL — ABNORMAL HIGH (ref 70–99)
Glucose, Bld: 121 mg/dL — ABNORMAL HIGH (ref 70–99)
Glucose, Bld: 121 mg/dL — ABNORMAL HIGH (ref 70–99)
Glucose, Bld: 155 mg/dL — ABNORMAL HIGH (ref 70–99)
Potassium: 4 mEq/L (ref 3.5–5.1)
Potassium: 4.1 mEq/L (ref 3.5–5.1)
Potassium: 4.2 mEq/L (ref 3.5–5.1)
Potassium: 4.3 mEq/L (ref 3.5–5.1)
Potassium: 4.4 mEq/L (ref 3.5–5.1)
Potassium: 4.6 mEq/L (ref 3.5–5.1)
Potassium: 4.7 mEq/L (ref 3.5–5.1)
Sodium: 138 mEq/L (ref 135–145)
Sodium: 139 mEq/L (ref 135–145)
Sodium: 140 mEq/L (ref 135–145)
Sodium: 141 mEq/L (ref 135–145)
Sodium: 137 mEq/L (ref 135–145)
Sodium: 138 mEq/L (ref 135–145)

## 2010-04-29 LAB — CBC
HCT: 25.1 % — ABNORMAL LOW (ref 39.0–52.0)
HCT: 25.8 % — ABNORMAL LOW (ref 39.0–52.0)
HCT: 27.8 % — ABNORMAL LOW (ref 39.0–52.0)
HCT: 28.4 % — ABNORMAL LOW (ref 39.0–52.0)
HCT: 29.6 % — ABNORMAL LOW (ref 39.0–52.0)
HCT: 30.6 % — ABNORMAL LOW (ref 39.0–52.0)
HCT: 31.3 % — ABNORMAL LOW (ref 39.0–52.0)
HCT: 32.6 % — ABNORMAL LOW (ref 39.0–52.0)
HCT: 33.5 % — ABNORMAL LOW (ref 39.0–52.0)
HCT: 36.5 % — ABNORMAL LOW (ref 39.0–52.0)
HCT: 39.2 % (ref 39.0–52.0)
HCT: 39.6 % (ref 39.0–52.0)
HCT: 39.7 % (ref 39.0–52.0)
Hemoglobin: 10.1 g/dL — ABNORMAL LOW (ref 13.0–17.0)
Hemoglobin: 10.4 g/dL — ABNORMAL LOW (ref 13.0–17.0)
Hemoglobin: 10.6 g/dL — ABNORMAL LOW (ref 13.0–17.0)
Hemoglobin: 11.1 g/dL — ABNORMAL LOW (ref 13.0–17.0)
Hemoglobin: 11.3 g/dL — ABNORMAL LOW (ref 13.0–17.0)
Hemoglobin: 8.8 g/dL — ABNORMAL LOW (ref 13.0–17.0)
Hemoglobin: 9.5 g/dL — ABNORMAL LOW (ref 13.0–17.0)
Hemoglobin: 9.7 g/dL — ABNORMAL LOW (ref 13.0–17.0)
Hemoglobin: 12.3 g/dL — ABNORMAL LOW (ref 13.0–17.0)
Hemoglobin: 12.9 g/dL — ABNORMAL LOW (ref 13.0–17.0)
Hemoglobin: 13.3 g/dL (ref 13.0–17.0)
MCH: 29.3 pg (ref 26.0–34.0)
MCH: 29.4 pg (ref 26.0–34.0)
MCH: 29.4 pg (ref 26.0–34.0)
MCH: 29.6 pg (ref 26.0–34.0)
MCH: 29.6 pg (ref 26.0–34.0)
MCH: 28.6 pg (ref 26.0–34.0)
MCH: 29.2 pg (ref 26.0–34.0)
MCH: 29.3 pg (ref 26.0–34.0)
MCHC: 33.9 g/dL (ref 30.0–36.0)
MCHC: 34 g/dL (ref 30.0–36.0)
MCHC: 34 g/dL (ref 30.0–36.0)
MCHC: 34 g/dL (ref 30.0–36.0)
MCHC: 34 g/dL (ref 30.0–36.0)
MCHC: 32.9 g/dL (ref 30.0–36.0)
MCHC: 33.5 g/dL (ref 30.0–36.0)
MCHC: 33.7 g/dL (ref 30.0–36.0)
MCV: 86 fL (ref 78.0–100.0)
MCV: 86.5 fL (ref 78.0–100.0)
MCV: 86.9 fL (ref 78.0–100.0)
MCV: 86.9 fL (ref 78.0–100.0)
MCV: 87.2 fL (ref 78.0–100.0)
MCV: 87.2 fL (ref 78.0–100.0)
MCV: 87.2 fL (ref 78.0–100.0)
MCV: 87.3 fL (ref 78.0–100.0)
MCV: 87.4 fL (ref 78.0–100.0)
MCV: 88.1 fL (ref 78.0–100.0)
MCV: 87.1 fL (ref 78.0–100.0)
Platelets: 241 10*3/uL (ref 150–400)
Platelets: 266 10*3/uL (ref 150–400)
Platelets: 285 10*3/uL (ref 150–400)
Platelets: 341 10*3/uL (ref 150–400)
Platelets: 189 10*3/uL (ref 150–400)
Platelets: 247 10*3/uL (ref 150–400)
Platelets: 189 10*3/uL (ref 150–400)
Platelets: 200 10*3/uL (ref 150–400)
RBC: 2.97 MIL/uL — ABNORMAL LOW (ref 4.22–5.81)
RBC: 3.22 MIL/uL — ABNORMAL LOW (ref 4.22–5.81)
RBC: 3.41 MIL/uL — ABNORMAL LOW (ref 4.22–5.81)
RBC: 3.56 MIL/uL — ABNORMAL LOW (ref 4.22–5.81)
RBC: 3.59 MIL/uL — ABNORMAL LOW (ref 4.22–5.81)
RBC: 3.74 MIL/uL — ABNORMAL LOW (ref 4.22–5.81)
RBC: 3.87 MIL/uL — ABNORMAL LOW (ref 4.22–5.81)
RBC: 4.48 MIL/uL (ref 4.22–5.81)
RBC: 4.5 MIL/uL (ref 4.22–5.81)
RBC: 4.74 MIL/uL (ref 4.22–5.81)
RDW: 14.3 % (ref 11.5–15.5)
RDW: 14.5 % (ref 11.5–15.5)
RDW: 14.6 % (ref 11.5–15.5)
RDW: 14.7 % (ref 11.5–15.5)
RDW: 14.8 % (ref 11.5–15.5)
RDW: 14.5 % (ref 11.5–15.5)
RDW: 14.7 % (ref 11.5–15.5)
RDW: 15.1 % (ref 11.5–15.5)
RDW: 15.4 % (ref 11.5–15.5)
RDW: 15.3 % (ref 11.5–15.5)
RDW: 15.7 % — ABNORMAL HIGH (ref 11.5–15.5)
WBC: 10.4 10*3/uL (ref 4.0–10.5)
WBC: 10.6 10*3/uL — ABNORMAL HIGH (ref 4.0–10.5)
WBC: 11 10*3/uL — ABNORMAL HIGH (ref 4.0–10.5)
WBC: 11 10*3/uL — ABNORMAL HIGH (ref 4.0–10.5)
WBC: 11.4 10*3/uL — ABNORMAL HIGH (ref 4.0–10.5)
WBC: 11.8 10*3/uL — ABNORMAL HIGH (ref 4.0–10.5)
WBC: 12 10*3/uL — ABNORMAL HIGH (ref 4.0–10.5)
WBC: 16.9 10*3/uL — ABNORMAL HIGH (ref 4.0–10.5)
WBC: 11.4 10*3/uL — ABNORMAL HIGH (ref 4.0–10.5)
WBC: 12.8 10*3/uL — ABNORMAL HIGH (ref 4.0–10.5)
WBC: 11.4 10*3/uL — ABNORMAL HIGH (ref 4.0–10.5)

## 2010-04-29 LAB — PROTIME-INR: INR: 0.99 (ref 0.00–1.49)

## 2010-04-29 LAB — COMPREHENSIVE METABOLIC PANEL
ALT: 20 U/L (ref 0–53)
Alkaline Phosphatase: 26 U/L — ABNORMAL LOW (ref 39–117)
CO2: 26 mEq/L (ref 19–32)
GFR calc non Af Amer: 48 mL/min — ABNORMAL LOW (ref >60)
Glucose, Bld: 118 mg/dL — ABNORMAL HIGH (ref 70–99)
Potassium: 5.8 mEq/L — ABNORMAL HIGH (ref 3.5–5.1)
Sodium: 133 mEq/L — ABNORMAL LOW (ref 135–145)
Total Bilirubin: 1 mg/dL (ref 0.3–1.2)

## 2010-04-29 LAB — CARDIAC PANEL(CRET KIN+CKTOT+MB+TROPI)
CK, MB: 6.2 ng/mL (ref 0.3–4.0)
CK, MB: 107.4 ng/mL (ref 0.3–4.0)
CK, MB: 36.7 ng/mL (ref 0.3–4.0)
CK, MB: 67.2 ng/mL (ref 0.3–4.0)
CK, MB: 98 ng/mL (ref 0.3–4.0)
Relative Index: 6.2 — ABNORMAL HIGH (ref 0.0–2.5)
Relative Index: 5.4 — ABNORMAL HIGH (ref 0.0–2.5)
Relative Index: 9.3 — ABNORMAL HIGH (ref 0.0–2.5)
Total CK: 100 U/L (ref 7–232)
Total CK: 1059 U/L — ABNORMAL HIGH (ref 7–232)
Total CK: 1109 U/L — ABNORMAL HIGH (ref 7–232)
Total CK: 804 U/L — ABNORMAL HIGH (ref 7–232)
Total CK: 936 U/L — ABNORMAL HIGH (ref 7–232)
Troponin I: 0.39 ng/mL — ABNORMAL HIGH (ref 0.00–0.06)
Troponin I: 12.14 ng/mL (ref 0.00–0.06)
Troponin I: 12.51 ng/mL (ref 0.00–0.06)
Troponin I: 15.04 ng/mL (ref 0.00–0.06)

## 2010-04-29 LAB — MRSA PCR SCREENING
MRSA by PCR: NEGATIVE
MRSA by PCR: NEGATIVE

## 2010-04-29 LAB — DIFFERENTIAL
Basophils Absolute: 0.1 10*3/uL (ref 0.0–0.1)
Basophils Absolute: 0 10*3/uL (ref 0.0–0.1)
Basophils Relative: 1 % (ref 0–1)
Basophils Relative: 0 % (ref 0–1)
Eosinophils Absolute: 0.3 10*3/uL (ref 0.0–0.7)
Eosinophils Relative: 3 % (ref 0–5)
Eosinophils Relative: 3 % (ref 0–5)
Lymphocytes Relative: 8 % — ABNORMAL LOW (ref 12–46)
Monocytes Absolute: 0.6 10*3/uL (ref 0.1–1.0)
Monocytes Absolute: 0.9 10*3/uL (ref 0.1–1.0)
Monocytes Absolute: 0.5 10*3/uL (ref 0.1–1.0)
Monocytes Relative: 5 % (ref 3–12)
Monocytes Relative: 8 % (ref 3–12)
Neutro Abs: 9.7 10*3/uL — ABNORMAL HIGH (ref 1.7–7.7)
Neutro Abs: 8.2 10*3/uL — ABNORMAL HIGH (ref 1.7–7.7)

## 2010-04-29 LAB — CROSSMATCH: Antibody Screen: NEGATIVE

## 2010-04-29 LAB — POCT CARDIAC MARKERS
CKMB, poc: 2.8 ng/mL (ref 1.0–8.0)
Myoglobin, poc: 139 ng/mL (ref 12–200)
Troponin i, poc: 0.09 ng/mL (ref 0.00–0.09)

## 2010-04-29 LAB — PREPARE PLATELETS

## 2010-04-29 LAB — TYPE AND SCREEN: Antibody Screen: NEGATIVE

## 2010-04-29 LAB — RAPID URINE DRUG SCREEN, HOSP PERFORMED
Amphetamines: NOT DETECTED
Barbiturates: NOT DETECTED
Benzodiazepines: NOT DETECTED

## 2010-04-29 LAB — POCT I-STAT, CHEM 8
Chloride: 105 mEq/L (ref 96–112)
Creatinine, Ser: 1.8 mg/dL — ABNORMAL HIGH (ref 0.4–1.5)
Hemoglobin: 13.9 g/dL (ref 13.0–17.0)
Potassium: 5.2 mEq/L — ABNORMAL HIGH (ref 3.5–5.1)
Sodium: 135 mEq/L (ref 135–145)

## 2010-04-29 LAB — LIPID PANEL
Total CHOL/HDL Ratio: 4.6 RATIO
Triglycerides: 114 mg/dL (ref <150)
VLDL: 23 mg/dL (ref 0–40)

## 2010-04-29 LAB — TROPONIN I: Troponin I: 0.36 ng/mL — ABNORMAL HIGH (ref 0.00–0.06)

## 2010-04-29 LAB — HEMOGLOBIN AND HEMATOCRIT, BLOOD
HCT: 26.5 % — ABNORMAL LOW (ref 39.0–52.0)
HCT: 38.6 % — ABNORMAL LOW (ref 39.0–52.0)
Hemoglobin: 9.1 g/dL — ABNORMAL LOW (ref 13.0–17.0)

## 2010-04-29 LAB — URINALYSIS, ROUTINE W REFLEX MICROSCOPIC
Glucose, UA: NEGATIVE mg/dL
Hgb urine dipstick: NEGATIVE
Protein, ur: NEGATIVE mg/dL

## 2010-04-29 LAB — HEPARIN LEVEL (UNFRACTIONATED): Heparin Unfractionated: 0.3 IU/mL (ref 0.30–0.70)

## 2010-04-29 LAB — APTT
aPTT: 22 seconds — ABNORMAL LOW (ref 24–37)
aPTT: 25 seconds (ref 24–37)

## 2010-04-29 LAB — HEPATIC FUNCTION PANEL
AST: 61 U/L — ABNORMAL HIGH (ref 0–37)
Alkaline Phosphatase: 27 U/L — ABNORMAL LOW (ref 39–117)
Bilirubin, Direct: <0.1 mg/dL (ref 0.0–0.3)
Total Bilirubin: 0.5 mg/dL (ref 0.3–1.2)

## 2010-04-29 LAB — HEMOGLOBIN A1C: Mean Plasma Glucose: 131 mg/dL — ABNORMAL HIGH (ref <117)

## 2010-04-29 LAB — CK TOTAL AND CKMB (NOT AT ARMC)
CK, MB: 6.9 ng/mL (ref 0.3–4.0)
Total CK: 85 U/L (ref 7–232)
Total CK: 261 U/L — ABNORMAL HIGH (ref 7–232)

## 2010-04-29 LAB — MAGNESIUM
Magnesium: 2.2 mg/dL (ref 1.5–2.5)
Magnesium: 2 mg/dL (ref 1.5–2.5)

## 2010-04-29 LAB — HEMOCCULT GUIAC POC 1CARD (OFFICE): Fecal Occult Bld: POSITIVE

## 2010-05-01 LAB — HEMOGLOBIN AND HEMATOCRIT, BLOOD
HCT: 38.5 % — ABNORMAL LOW (ref 39.0–52.0)
HCT: 38.5 % — ABNORMAL LOW (ref 39.0–52.0)
Hemoglobin: 12.5 g/dL — ABNORMAL LOW (ref 13.0–17.0)
Hemoglobin: 12.5 g/dL — ABNORMAL LOW (ref 13.0–17.0)

## 2010-05-01 LAB — CROSSMATCH: Antibody Screen: NEGATIVE

## 2010-05-01 LAB — BASIC METABOLIC PANEL
BUN: 18 mg/dL (ref 6–23)
CO2: 26 mEq/L (ref 19–32)
Calcium: 8.5 mg/dL (ref 8.4–10.5)
GFR calc non Af Amer: 53 mL/min — ABNORMAL LOW (ref >60)
Glucose, Bld: 154 mg/dL — ABNORMAL HIGH (ref 70–99)

## 2010-05-01 LAB — DIFFERENTIAL
Basophils Relative: 0 % (ref 0–1)
Eosinophils Absolute: 0.2 10*3/uL (ref 0.0–0.7)
Eosinophils Relative: 2 % (ref 0–5)
Monocytes Relative: 6 % (ref 3–12)
Neutrophils Relative %: 79 % — ABNORMAL HIGH (ref 43–77)

## 2010-05-01 LAB — CBC
HCT: 37.9 % — ABNORMAL LOW (ref 39.0–52.0)
MCHC: 32.4 g/dL (ref 30.0–36.0)
MCV: 82.8 fL (ref 78.0–100.0)
Platelets: 179 10*3/uL (ref 150–400)
RBC: 4.58 MIL/uL (ref 4.22–5.81)

## 2010-05-16 LAB — HEMOGLOBIN AND HEMATOCRIT, BLOOD
HCT: 25.1 % — ABNORMAL LOW (ref 39.0–52.0)
HCT: 26.6 % — ABNORMAL LOW (ref 39.0–52.0)
HCT: 28.3 % — ABNORMAL LOW (ref 39.0–52.0)
HCT: 30 % — ABNORMAL LOW (ref 39.0–52.0)
HCT: 30 % — ABNORMAL LOW (ref 39.0–52.0)
HCT: 34.3 % — ABNORMAL LOW (ref 39.0–52.0)
Hemoglobin: 10.1 g/dL — ABNORMAL LOW (ref 13.0–17.0)
Hemoglobin: 10.4 g/dL — ABNORMAL LOW (ref 13.0–17.0)
Hemoglobin: 11.6 g/dL — ABNORMAL LOW (ref 13.0–17.0)
Hemoglobin: 9 g/dL — ABNORMAL LOW (ref 13.0–17.0)
Hemoglobin: 9.7 g/dL — ABNORMAL LOW (ref 13.0–17.0)

## 2010-05-16 LAB — CBC
HCT: 27.1 % — ABNORMAL LOW (ref 39.0–52.0)
Hemoglobin: 12.7 g/dL — ABNORMAL LOW (ref 13.0–17.0)
Hemoglobin: 9.1 g/dL — ABNORMAL LOW (ref 13.0–17.0)
MCHC: 34.1 g/dL (ref 30.0–36.0)
MCHC: 34.2 g/dL (ref 30.0–36.0)
MCV: 88.6 fL (ref 78.0–100.0)
MCV: 88.7 fL (ref 78.0–100.0)
Platelets: 224 10*3/uL (ref 150–400)
RBC: 4.22 MIL/uL (ref 4.22–5.81)
RDW: 15.1 % (ref 11.5–15.5)
WBC: 10.4 10*3/uL (ref 4.0–10.5)
WBC: 12.1 10*3/uL — ABNORMAL HIGH (ref 4.0–10.5)

## 2010-05-16 LAB — CROSSMATCH: ABO/RH(D): A POS

## 2010-05-16 LAB — BASIC METABOLIC PANEL
BUN: 19 mg/dL (ref 6–23)
CO2: 25 mEq/L (ref 19–32)
Calcium: 8.4 mg/dL (ref 8.4–10.5)
Calcium: 8.8 mg/dL (ref 8.4–10.5)
Chloride: 106 mEq/L (ref 96–112)
Chloride: 108 mEq/L (ref 96–112)
Creatinine, Ser: 1.1 mg/dL (ref 0.4–1.5)
Creatinine, Ser: 1.28 mg/dL (ref 0.4–1.5)
GFR calc Af Amer: >60 mL/min (ref >60)
GFR calc Af Amer: >60 mL/min (ref >60)
GFR calc non Af Amer: 55 mL/min — ABNORMAL LOW (ref >60)
GFR calc non Af Amer: 56 mL/min — ABNORMAL LOW (ref >60)
Glucose, Bld: 118 mg/dL — ABNORMAL HIGH (ref 70–99)
Glucose, Bld: 127 mg/dL — ABNORMAL HIGH (ref 70–99)
Potassium: 4.1 mEq/L (ref 3.5–5.1)
Sodium: 134 mEq/L — ABNORMAL LOW (ref 135–145)
Sodium: 139 mEq/L (ref 135–145)

## 2010-05-16 LAB — PROTIME-INR: Prothrombin Time: 14.3 seconds (ref 11.6–15.2)

## 2010-05-16 LAB — ABO/RH: ABO/RH(D): A POS

## 2010-06-13 ENCOUNTER — Telehealth: Payer: Self-pay | Admitting: Gastroenterology

## 2010-06-13 NOTE — Telephone Encounter (Signed)
Patient notified of Dr. Kelby Fam recommendation

## 2010-06-13 NOTE — Telephone Encounter (Signed)
Patient wants to know if he can take Ibuprofen since he had a subtotal colectomy in December. States he has arthritis and would like to take Ibuprofen. Please, advsie

## 2010-06-13 NOTE — Telephone Encounter (Signed)
Ok for NSAIDS

## 2010-06-18 ENCOUNTER — Encounter (INDEPENDENT_AMBULATORY_CARE_PROVIDER_SITE_OTHER): Payer: Self-pay | Admitting: General Surgery

## 2010-06-27 ENCOUNTER — Ambulatory Visit
Admission: RE | Admit: 2010-06-27 | Discharge: 2010-06-27 | Disposition: A | Discharge status: Home / Self Care | Payer: Medicare Other | Source: Ambulatory Visit | Attending: Cardiology | Admitting: Cardiology

## 2010-06-27 ENCOUNTER — Other Ambulatory Visit: Payer: Self-pay | Admitting: Cardiology

## 2010-06-27 DIAGNOSIS — R0602 Shortness of breath: Secondary | ICD-10-CM

## 2010-06-29 NOTE — Consult Note (Signed)
NAMEPETR, HOLTE NO.:  [id number]   MEDICAL RECORD NO.:  [id number]          PATIENT TYPE:  INP   LOCATION:  [id number]                         FACILITY:  [facility]   PHYSICIAN:  Freeman Caldron. Pauline Aus, M.D.   DATE OF BIRTH:  09-26-1932   DATE OF CONSULTATION:  DATE OF DISCHARGE:                                   CONSULTATION   REFERRING PHYSICIAN:  This is an inpatient of Dr. Reynold Bowen and Dr. Marye Round.  He  is an outpatient primary care patient of Dr. Zada Girt.   I appreciate the opportunity to participate in the care of the very nice 4-  year-old Gary [name], currently on the inpatient service of Dr. Marye Round,  whose primary physician is Dr. Zada Girt, by providing consultative services,  at Dr. Anette Guarneri request, in regard to the patient's chest pain.   He says he was watching television and had a feeling of possible tightness  in his left shoulder, but was more concerned because he had a series of  short sharp pains over his left pectoralis area.  This occurred off and on  for some period of time, and he eventually became concerned and came to the  emergency room.  These pains have since stopped.  He was not aware of any  fasciculation, diaphoresis, dyspnea, or palpitation.  He did not try rubbing  or pressing on the area where the discomfort was in order to see whether  that made a difference.  He has had short sharp pains over his left masseter  muscle for some time, not a problem which is associated with chewing or  other activity, and says that the discomfort over his left pectoralis area  was very similar in quality and duration.  That pain also is not related to  chewing or any other physical exercise.   It is of note that he works out regularly at [employer] in [place].  He  says he works out about a half hour three times a week.  He uses a bicycle  rather than the treadmill.  He has noticed it over the last few years.  He  develops pain in both hips when he walks  more than a few blocks and really  cannot walk much longer distances than that.  He had no difficulty with any  chest discomfort during his exercise sessions last week.   PAST MEDICAL HISTORY:  His past medical history includes long-term treatment  for hypertension and BPH.  He has not had prostate surgery.  He has had a  number of kidney stones, none of which has required surgery or lithotripsy.  He has had a lumbar fusion.  He had GI bleeding in 2004 and diverticulosis,  treated nonoperatively.  He also was treated with surgery for crossed eyes  at the age of 55.   CURRENT MEDICATIONS:  1.  Cardura 4 mg daily.  2.  Lisinopril 10 mg daily.  3.  Allegra 90 mg daily.   FAMILY HISTORY:  His father died at 31 of complications after a fall.  His  mother died  at age of cancer of the pancreas.  He has one son and one  daughter, and one granddaughter who is a Financial trader major at [employer].   SOCIAL HISTORY:  He and his wife have been married for 49 years.  He lives  at home with her.  He uses no alcohol, tobacco, or drugs.  He does drink  about two cups of coffee a day and occasionally tea.  He is a retired Database administrator.  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   REVIEW OF SYSTEMS:  CONSTITUTIONAL:  He denies fever, chills, or sweats.  He  wears glasses for reading.  He is mildly deaf is bothered by tinnitus.  He  is followed by Dr. Idelle Crouch at [facility] for ear problems.  CARDIOVASCULAR: :  See the history of present illness.  He denies dyspnea on  exertion in addition to the others.  RESPIRATORY:  No cough or wheezing.  He  had never smoked.  GI:  He has diverticulitis with __________ recently.  GU:  He takes Cardura regularly for urinary obstruction.  He says that, if he  misses his Cardura, he has considerable difficulty starting his urine  stream.  MUSCULOSKELETAL:  See the discussion above.  SKIN and BREAST:  No  rash or  nodule.  NEUROLOGIC:  No fainting or syncope.  PSYCHIATRIC:  No  depression or hallucination.  ENDOCRINE:  No known diabetes or thyroid  disease.  HEME and LYMPH:  No swelling in the neck, axilla, or groin.  ALLERGIC/LYMPHATIC:  He has an intolerance for Naprosyn.  He has no other  drug allergy.   PHYSICAL EXAMINATION:  VITAL SIGNS:  Blood pressure 130/70, pulse 70 and  regular, respirations 18 and unlabored, temperature afebrile.  GENERAL:  He is a well-developed, well-nourished man who looks fully his  stated age of 77 but in good health.  He is oriented to person, place, and  time.  His mood and affect are pleasant.  HEENT:  His conjunctivae reveal no xanthelasma, icterus, or arcus senilis.  He has many of his own teeth which are in fair repair.  The oral mucosa  reveals no pallor or cyanosis.  The tongue is well papillated.  NECK:  His neck is supple and symmetrical.  The trachea is midline and  mobile.  There is no palpable thyromegaly or cervical node.  No carotid  bruit or JVP.  RESPIRATORY:  His respiratory effort is normal at rest.  LUNGS:  His lungs are clear to auscultation and percussion.  BACK:  His back is straight and nontender.  There is no kyphosis or  scoliosis.  His gait is not tested but is discussed above.  Muscle strength  and tone are normal for his age.  CARDIOVASCULAR:  Bicardiac apical impulse is cryptic.  The left border of  cardiac dullness is within the left anterior axillary line.  His heart  rhythm is regular and the rate is normal.  There is no gallop, click, or  murmur.  His digits and nails reveal no clubbing or cyanosis.  SKIN:  Skin and subcutaneous tissue reveal no stasis dermatitis or ulcer.  ABDOMEN:  Mildly protuberant and nontender.  There is no palpable enlarged  liver or spleen.  The abdominal aorta is without bruit.  The femoral  arteries are without bruits.  EXTREMITIES:  The pedal pulses may be asymmetrical.  He has a 2+ left posterior  tibial pulse and  a barely palpable or difficult to locate.  Right  dorsalis pedis pulses are both palpable.  There is no ankle edema.   LABORATORY DATA:  Laboratory data includes an EKG done in the emergency room  at [facility].  This was within normal limits.  An echocardiogram done  today is briefly reviewed and demonstrates normal left ventricular size and  contractility, with no apparent area of focal wall motion abnormality, and  with normal wall thickness.  He has had three sets of CK with MB and  troponin I results, all of which are normal and has also had a myoglobin  test which was normal.  His total cholesterol was 180, HDL 34, LDL 111.   IMPRESSION:  This is a very nice 77 year old man who had a fairly brief  period of atypical chest pain, and who maintains a pretty good level of  exercise, has had no test results suggestive of myocardial ischemia.  I  think there is no need to keep him in the hospital for further evaluation.   As I have explained to him and his wife, given that he is 42 years old, well  nourished, and lives in [place], he has a nontrivial probability of having  some degree of coronary artery disease.  It is reasonable to do further  screening tests, and we will arrange for him to have a Persantine Cardiolite  test as an outpatient.  Given the history of difficulty walking described in  detail above, a treadmill test would probably not be adequate.   I am rather suspicious that the difficulty he describes with walking is due  to spinal stenosis, particularly given its symmetrical distribution.  This  may deserve further follow-up by his primary physician.   I appreciate the opportunity to participate in Gary [name] care.  I will  contact the hospital team this morning and suggest that he can be  discharged, and my office will contact him later for outpatient testing.           ______________________________  Freeman Caldron. Pauline Aus, M.D.     DDG/MEDQ  D:   10/03/2004  T:  10/03/2004  Job:  [id number]

## 2010-06-29 NOTE — H&P (Signed)
Gary [name], Gary [name]                         ACCOUNT NO.:  [id number]   MEDICAL RECORD NO.:  [id number]                   PATIENT TYPE:  INP   LOCATION:  [id number]                                 FACILITY:  [facility]   PHYSICIAN:  Lance Muss, M.D.                DATE OF BIRTH:  27-May-1932   DATE OF ADMISSION:  09/20/2002  DATE OF DISCHARGE:                                HISTORY & PHYSICAL   PROBLEM LIST:  1. Gastrointestinal bleeding.  The patient has a history of having had a     colonoscopy in April 2004 for the workup of some abdominal discomfort     which had completely resolved shortly after the colonoscopy.  A     hyperplastic polyp was found.  This was done in [place].  He had felt     weak and nauseated and saw some blood, first noting it on September 15, 2002.     It was mild.  It was not associated with nausea, vomiting, or pain, and     by September 16, 2002, he was somewhat better.  Two or three days prior to     this admission, however, he started to have maroon-colored stools with     clots.  Again, there has been no nausea, vomiting, or pain or fever.     Because of this, the patient was seen in the office on the day of     admission.  Abdominal exam was unremarkable, but rectal exam showed a     moderate amount of maroon-colored stools with clots, and he was admitted     to [facility].  2. High blood pressure.  3. Kidney stones.  4. Decreased hearing.  5. History of abnormal chest x-ray dating back to 1997.  He had a left upper     lobe nodule that had been unchanged.  This was re-looked at in February     1998.  Another recheck in May of 1999 showed no change, and as far as I     could tell, the patient has not had any further followup.  6. History of actinic keratosis.  7. Lumbar HNP.  The patient has had a three level lumbar fusion with plates     and screws in 1997.  8. Benign prostatic hypertrophy on Cardura.   PAST HISTORY:  Back surgery as noted  above.  Surgery to correct strabismus  OD in 1942.   FAMILY HISTORY:  Father is alive at age 39 and lives in assisted living in  [place].  Mother died at age 90 of cancer of the pancreas.   SOCIAL HISTORY:  Nonsmoker.  No alcohol use.  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   ALLERGIES:  None known.   OTHER PHYSICIANS:  1. Dr. Jonathon Bellows at [facility] for GI problems.  2. Dr. Idelle Crouch at [facility]  for hearing problems.  3. Dr. Amalia Hailey, urologist.  4. Dr. Damaris Hippo at [facility] in [place].  5. Dr. Myrtie Hawk.   MEDICATIONS:  1. Allegra 180 mg one-half tablet a day.  2. Flonase two puffs q.a.m.  3. Cardura 4 mg daily.  4. Dyazide one tablet daily.  5. Lisinopril (the patient had been out of this medicine when I first saw     him for his first history and physical on August 12, 2002).  6. Viagra 100 mg as needed.   PHYSICAL EXAMINATION:  HEENT:  Marked decreased vision OD (the patient said  it is only 10% of normal).  NECK:  Normal.  Carotids 2+ without bruit.  Thyroid was negative.  CHEST:  Clear to auscultation and percussion.  CARDIOVASCULAR:  Regular rhythm, normal, without murmur.  ABDOMEN:  Soft, flat, and nontender.  Bowel sounds are normal.  There is no  organomegaly or abnormal masses.  There are no abdominal bruits.  GU:  Negative.  RECTAL:  Revealed a moderate amount of maroon stool with blood and clots.  The prostate was 3+ enlarged with some asymmetry with the left lobe being  larger than the right lobe.  Both were smooth and free of nodules.  BACK:  A long surgical scar, but otherwise unremarkable.  SKIN AND JOINT SURVEY, NEUROLOGICAL, DISTAL CIRCULATION:  Unremarkable.   ASSESSMENT:  Lower gastrointestinal bleed.   PLAN:  See orders.  Admit for IV fluids, stabilizing, and ultimately  colonoscopy.                                               Lance Muss, M.D.    Romie Minus  D:  09/21/2002  T:  09/21/2002  Job:   [id number]   cc:   Sandy Salaam. Deatra Ina, M.D. [facility]

## 2010-06-29 NOTE — Op Note (Signed)
Gary [name], Gary [name]               ACCOUNT NO.:  [id number]   MEDICAL RECORD NO.:  [id number]          PATIENT TYPE:  AMB   LOCATION:  SDS                          FACILITY:  [facility]   PHYSICIAN:  Robert L. Katy Fitch, M.D.  DATE OF BIRTH:  1932/04/16   DATE OF PROCEDURE:  12/03/2005  DATE OF DISCHARGE:                                 OPERATIVE REPORT   INDICATIONS AND JUSTIFICATIONS FOR PROCEDURE:  [name] has been seen in  my office periodically since 2004 and has had moderate dermatochalasis with  some nuclear cataracts.  Most recently on November 27, 2005, he was  complaining that the dermatochalasis was worse and it caused particular  visual symptoms on the left.  Indeed, examination does show that the skin  droops over the lashes and comes almost to the pupil on the left and there  is significant dermatochalasis on the right also.  Visual field testing with  the skin taped compared to when it was not taped shows an enormous loss of  about 30 or 40 degrees in the left field.  There is some loss on the right  also, although this is more difficult to determine because the right eye is  somewhat amblyopic.  The patient is count fingers at 4 feet in the right eye  and 20/25 on the left and the pressure is 18 in the right and 17 in the  left.  The pupil's motility, conjunctiva, cornea, anterior chamber and  fundus exam unremarkable except for the presence of cataract.  He does have  some macular erosion.  He felt he wanted to have upper lid blepharoplasties  in order to improve his vision. This was discussed and he understands that  his eyebrow is also somewhat low, but it is felt that by removing some of  the skin he will have improved visual symptoms.  Medically he should be  stable for this.   JUSTIFICATION FOR PERFORMING THE PROCEDURE IN AN OUTPATIENT SETTING:  Routine.   JUSTIFICATION FOR OVERNIGHT STAY:  None.   PREOPERATIVE DIAGNOSIS:  Severe dermatochalasis with visual  impairment.   POSTOPERATIVE DIAGNOSIS:  Severe dermatochalasis with visual impairment.   OPERATION PERFORMED:  Upper eyelid blepharoplasty.   SURGEON:  Robert L. Katy Fitch, M.D.   ANESTHESIA:  1% Xylocaine with epinephrine.   PROCEDURE:  The patient arrived in the minor surgery room at [facility] and was prepped and draped in the routine fashion.  Xylocaine 1%  with epinephrine was given.  Skin to be excised was carefully demarcated and  then excised along with some underlying subcutaneous and fatty tissue.  Bleeding was controlled with pressure.  Each wound was closed with a running  6-0 nylon suture and cold compresses were applied.  The patient left the  operating room having done nicely.   FOLLOWUP CARE:  The patient will be seen in my office in 6 days to have his  sutures removed.  He is to keep his head elevated today and use cold  compresses.  He is to start warm compresses tomorrow.  He is to use  Polysporin ointment on each wound once or twice daily.           ______________________________  Jaymes Graff Katy Fitch, M.D.     RLG/MEDQ  D:  12/03/2005  T:  12/04/2005  Job:  [id number]

## 2010-06-29 NOTE — Discharge Summary (Signed)
NAMEPHILIP, [name] NO.:  [id number]   MEDICAL RECORD NO.:  [id number]          PATIENT TYPE:  INP   LOCATION:  5511                         FACILITY:  [facility]   PHYSICIAN:  Jacquelynn Cree, M.D.   DATE OF BIRTH:  02-Jan-1933   DATE OF ADMISSION:  12/04/2005  DATE OF DISCHARGE:  12/06/2005                                 DISCHARGE SUMMARY   PRIMARY CARE PHYSICIAN:  Dr. Nyoka Cowden.   DISCHARGE DIAGNOSES:  1. Acute diverticular bleed.  2. Mild acute blood loss anemia.  3. Hypertension.  4. Benign prostatic hypertrophy.  5. Allergic rhinitis.  6. Recent blepharoplasty with ongoing ecchymotic areas to the periorbital      areas.   DISCHARGE MEDICATIONS:  1. Cipro 500 mg b.i.d. x1 week.  2. Flagyl 500 mg t.i.d. x1 week.  3. Neurontin 600 mg t.i.d.  4. Ultram 50 mg b.i.d.  5. Cardura 4 mg b.i.d.  6. Allegra 180 mg daily.  7. Flonase 2 puffs daily.   Note, the patient was instructed to hold his lisinopril secondary to soft  blood pressures until he follows up with his primary care physician.   PROCEDURES AND DIAGNOSTIC STUDIES:  None.   DISCHARGE LABORATORY VALUES:  CBC was stable with a white blood cell count  of 5.4, a hemoglobin of 12, hematocrit 34.1, platelets 187,000.  Sodium was  138, potassium 4.2, chloride 106, bicarb 24, BUN 13, creatinine 1.2, glucose  139.   BRIEF ADMISSION HISTORY OF PRESENT ILLNESS:  The patient is a 77 year old  male with known diverticulosis who presented to the emergency department  with complaints of rectal bleeding.  He described his stools as being black  in color.  He did not have any associated abdominal pain, nausea, or  vomiting.  On initial evaluation, he was not found to have any elevation of  his white blood cell count or fever.  He was admitted for evaluation and  further observation of his hemodynamic status.   HOSPITAL COURSE:  Problem 1.  ACUTE DIVERTICULAR BLEED:  The patient had an  episode of melanotic stools  during this hospitalization, but these resolved.  His hemoglobin has remained stable with serial evaluation.  He has remained  hemodynamically stable.  The patient wishes to go home and was instructed  that he should return to the emergency department for any evidence of  dizziness, tachycardia, or further black or bloody stools.   Problem 2.  HYPERTENSION:  The patient's hypertension  was controlled.  His  lisinopril was held during this hospitalization and due to the fact that he  has had ongoing soft blood pressures, we will continue to hold this  medication at discharge.  He should follow up with his primary care  physician and have his blood pressure checked before resuming this  medication.   Problem 3.  BENIGN PROSTATIC HYPERTROPHY:  The patient did continue his  usual dose of Cardura.   DISPOSITION:  The patient is stable for discharge home.  He understands when  to return to the emergency department if needed.  He is instructed to follow  up with his primary care physician early next week.      Jacquelynn Cree, M.D.  Electronically Signed     CR/MEDQ  D:  12/06/2005  T:  12/09/2005  Job:  [id number]   cc:   Dr. Nyoka Cowden

## 2010-06-29 NOTE — H&P (Signed)
NAMETSERING, MARDIROSSIAN NO.:  [id number]   MEDICAL RECORD NO.:  [id number]          PATIENT TYPE:  INP   LOCATION:  5511                         FACILITY:  [facility]   PHYSICIAN:  Jana Hakim, M.D. DATE OF BIRTH:  05/23/1932   DATE OF ADMISSION:  12/05/2005  DATE OF DISCHARGE:                                HISTORY & PHYSICAL   CHIEF COMPLAINT:  Rectal bleeding.   HISTORY OF PRESENT ILLNESS:  This is a 77 year old male presenting to the  emergency department secondary to an episode of rectal bleeding.  He denies  having any abdominal pain, nausea or vomiting.  Denies having any diarrhea.  He does report having a history of diverticulosis.   PAST MEDICAL HISTORY:  1. Allergic rhinitis.  2. Hypertension.  3. Benign prostatic hypertrophy.  4. Postherpetic neuralgia with pain of the left shoulder and upper      extremities.   MEDICATIONS:  Include:  1. Allegra 180 mg one p.o. every day.  2. Cardura 4 mg one p.o. every day.  3. Lisinopril 20 mg one p.o. every day.  4. Neurontin; dose needs to be verified.  Patient reports 600 mg one p.o.      t.i.d.  5. Ultracet one p.o. t.i.d.   ALLERGIES:  PATIENT HAS ALLERGIES TO NAPROSYN WHICH HE REPORTS CAUSES  FLUSHING.   SOCIAL HISTORY:  Nonsmoker, nondrinker.  Patient is married.   FAMILY HISTORY:  Negative coronary artery disease, negative hypertension,  negative diabetes, and negative cancer history.   PAST SURGICAL HISTORY:  1. Status post left blepharoplasty bilaterally.  2. Status post lumbar fusion L3-L4.   PHYSICAL EXAMINATION FINDINGS:  GENERAL:   A 77 year old obese, well-  developed male in no discomfort or distress.  VITAL SIGNS:  Temperature 97.9, blood pressure 125/71, heart rate 69,  respirations 18, O2 saturation 96%.  HEENT:  Normocephalic, atraumatic.  Positive erythematous and edematous  upper eyelids bilaterally.  Pupils are equally round and reactive to light.  There is no scleral icterus  and no scleral erythema.  Oropharynx is clear.  Dentition fair.  NECK:  Supple.  Full range of motion.  No thyromegaly, adenopathy or jugular  venous distension.  CARDIOVASCULAR:  Regular rate and rhythm.  LUNGS:  Clear to auscultation bilaterally.  ABDOMEN:  Positive bowel sounds.  Soft, nontender, nondistended.  EXTREMITIES:  __________.  NEUROLOGICAL EXAMINATION:  Alert and oriented x3.  Cranial nerves are  intact.  There are no sensory or motor function deficits.   LABORATORY STUDIES:  White blood cell count 8.0, hemoglobin 12.5, hematocrit  36.6, platelets 188, MCV 89, PT 12.8, INR 1.0, PTT 27.   ASSESSMENT:  A 77 year old male with a history of diverticulosis presenting  with rectal bleeding.   ADMITTING DIAGNOSES:  1. Rectal bleeding.  2. Diverticulosis.  3. Anemia, normocytic, but probably has a mixed picture with an iron      deficiency anemia.  4. Hypertension.  5. Benign prostatic hypertrophy.  6. Postherpetic neuralgia with chronic pain.   PLAN:  Patient will be admitted, placed on a clear liquid diet.  IV fluids  will be ordered for hydration, patient will also be placed on IV antibiotic  therapy of Cipro and metronidazole.  Patient will be on GI prophylaxis;  however, DVT prophylaxis has been withheld at this time secondary to his  bleeding episode.      Jana Hakim, M.D.  Electronically Signed     HJ/MEDQ  D:  12/05/2005  T:  12/05/2005  Job:  [id number]   cc:   Lance Muss, M.D.

## 2010-06-29 NOTE — Discharge Summary (Signed)
Gary [name], Gary [name]                         ACCOUNT NO.:  [id number]   MEDICAL RECORD NO.:  [id number]                   PATIENT TYPE:  INP   LOCATION:  [id number]                                 FACILITY:  [facility]   PHYSICIAN:  Lance Muss, M.D.                DATE OF BIRTH:  06-12-1932   DATE OF ADMISSION:  09/20/2002  DATE OF DISCHARGE:  09/23/2002                                 DISCHARGE SUMMARY   PROBLEM:  Lower gastrointestinal bleed.  This 77 year old patient had dark  blood coming from his stool one day on September 14, 2002 and then had no other  symptoms until three days prior to admission when he developed achiness,  myalgias, felt weak, and saw dark red stools.  He had no nausea, vomiting,  fever, or abdominal pain.  He had had a colonoscopy at [place] [facility] as a work-up for abdominal pain in April of 2004 and  a small hyperplastic polyp was found, but no other abnormalities.   HOSPITAL COURSE:  The patient was admitted on September 20, 2002 with a history  of lower GI bleed.  Physical examination on admission showed maroon colored  stools, mostly just clots.  The abdomen was unremarkable and was nontender  otherwise.  On August 9 the patient had no new symptoms.  Hemoglobin was  13.9.  CMET was normal.  EKG was normal and the patient was followed  carefully.  On August 10 the patient had had continued small amount of blood  from the stool but it was much less.  Vital signs were stable.  Abdominal  examination was unremarkable.  Hemoglobin had taken a slight drop to 12.3  from 13.9.  The patient was seen by Sandy Salaam. Deatra Ina, M.D. [facility] in  consultation and it was felt that just careful observation was appropriate.  On August 11 the patient had no GI symptoms.  He had developed mild dysuria  and now relates that he had had it for four or five days.  He has had a  history of prostatitis in the past.  Hemoglobin was stable at 12.8 and the  patient was  started on Cipro.  On August 12 the patient had had a bowel  movement.  No new problems had developed and it was felt that the patient  was ready for discharge.  A lung nodule had been seen on chest x-ray but  this was unchanged from reports of his chest x-ray from [place] and no follow-  up was needed.   DISCHARGE DIAGNOSES:  1. Lower gastrointestinal bleed, probably diverticulosis with bleed.  2. Prostatitis.   CONDITION ON DISCHARGE:  Improved.   CONSULTS:  Sandy Salaam. Deatra Ina, M.D. [facility]   COMPLICATIONS:  None.   PROCEDURE:  None.   DISCHARGE MEDICATIONS:  1. Allegra 180 mg daily.  2. Flonase as needed.  3. Cardura as usual.  4. Zestoretic as usual.   PAIN MANAGEMENT:  Tylenol.   ACTIVITY:  Usual.   DIET:  Low fiber.   FOLLOWUP:  The patient was to be seen by Lance Muss, M.D. in two weeks  or call sooner if more bleeding is noted.  A CBC would be rechecked at that  time.  No follow-up with Sandy Salaam. Deatra Ina, M.D. [facility] was recommended.                                               Lance Muss, M.D.    Romie Minus  D:  10/15/2002  T:  10/15/2002  Job:  [id number]

## 2010-06-29 NOTE — Discharge Summary (Signed)
Gary [name], Gary [name] NO.:  [id number]   MEDICAL RECORD NO.:  [id number]          PATIENT TYPE:  INP   LOCATION:  [id number]                         FACILITY:  [facility]   PHYSICIAN:  Edythe Lynn, M.D.       DATE OF BIRTH:  [birth year]   DATE OF ADMISSION:  10/02/2004  DATE OF DISCHARGE:  10/03/2004                                 DISCHARGE SUMMARY   DISCHARGE DIAGNOSES:  1.  Chest pain, suspect musculoskeletal.  2.  Hypertension.  3.  Metabolic syndrome.  4.  Benign prostatic hypertrophy.  5.  Gastroesophageal reflux disease.  6.  History of nephrolithiasis.  7.  Diverticulosis with history of lower gastrointestinal bleed.  8.  History of lumbar fusion surgery.   DISCHARGE MEDICATIONS:  1.  Aspirin 81 mg by mouth daily.  2.  Cardura 4 mg by mouth daily.  3.  Lisinopril 20 mg by mouth daily.  4.  Prilosec OTC 20 mg by mouth daily.  5.  Flaxseed Oil 1 capsule by mouth daily.  6.  __________ 150 mg p.o. q.6 hours p.r.n. for pain.  7.  Allegra 90 mg by mouth daily.   CONDITION ON DISCHARGE:  Patient was discharged in good condition symptom  free.  He was instructed to followup with Dr. Concepcion Elk, from cardiology,  for outpatient Cardiolite stress test, and also to followup with Dr. Zada Girt, his primary care physician.   PROCEDURES:  No procedures were obtained.   CONSULTATIONS:  We have consulted Dr. Concepcion Elk from [facility].   BRIEF ADMITTING HISTORY AND PHYSICAL:  Gary [name] is a 77 year old man with  hypotension and BPH, history of nephrolithiasis, who came to the emergency  room for episodes of a couple of seconds sharp upper left chest pain.  No  diaphoresis, no shortness of breath.  He was found on admission to have  stable vital signs, normal cardiac enzymes and normal EKG.  He was admitted  for rule out MI and 23 hours obs.  For admitting H&P, please see the  dictated H&P done by Dr. Jerilynn Som on October 01, 2004.   HOSPITAL COURSE:   Chest pain.  Gary [name] was admitted to the telemetry  unit.  He was placed on 24 hour monitoring.  He was started on Nitropaste,  beta-blockers and his aspirin and Ace inhibitors were continued through the  hospitalization.  He was asymptomatic throughout the hospital stay without  any recurrent events.  Cardiac enzymes were negative x3.  A fasting lipid  panel was obtained which showed mild triglyceride elevation to 174 and LDL  of 101 and HDL of 34.  Dr. Pauline Aus saw Gary [name] in consultations and he  suggested an outpatient Cardiolite  procedure.  Gary [name] for that.  Also, at time of discharge Gary [name] was instructed to take a daily  aspirin, to report back to the emergency room if he has any chest pain and  to return to Dr. Zada Girt for followup.  ______________________________  Edythe Lynn, M.D.     SL/MEDQ  D:  10/03/2004  T:  10/03/2004  Job:  [id number]   cc:   Freeman Caldron. Pauline Aus, M.D.  [address] [place]  [place] [address]  Fax: [phone]   Lance Muss, M.D.  [address] [place]  [place] [address]  Fax: [phone]

## 2010-06-29 NOTE — H&P (Signed)
Gary [name], APPLEBY NO.:  [id number]   MEDICAL RECORD NO.:  [id number]           PATIENT TYPE:   LOCATION:                                 FACILITY:   PHYSICIAN:  Edythe Lynn, M.D.       DATE OF BIRTH:  [birth year]   DATE OF ADMISSION:  11/14/2004  DATE OF DISCHARGE:                                HISTORY & PHYSICAL   PRIMARY CARE PHYSICIAN:  Dr. Zada Girt.   CHIEF COMPLAINT:  Passing bright red blood per rectum.   HISTORY OF PRESENT ILLNESS:  Gary [name] is a 77 year old man with  hypertension, benign prostatic hyperplasia, diverticulosis, history of lower  GI bleed.  Had an episode of passing bright red blood per rectum tonight.  He came to the emergency room and then he passed another large bloody bowel  movement in the emergency room.  Patient denies any nausea, vomiting, or  hematemesis.   PAST MEDICAL HISTORY:  1.  Hypertension.  2.  Benign prostatic hyperplasia.  3.  Zoster herpes with postherpetic neuralgia in September 2006.  4.  Status post lumbar fusion.  5.  Seasonal allergies.  6.  History of nephrolithiasis.  7.  Diverticulosis.  Colonoscopies done at [facility].   SOCIAL HISTORY:  Patient is married.  He lives with his wife.  He does not  drink alcohol.  Does not use tobacco or does not use any drugs.  He has two  daughters.   FAMILY HISTORY:  Noncontributory.   ALLERGIES:  Patient is allergic to NAPROSYN and maybe TAPE.   REVIEW OF SYSTEMS:  As per HPI.  The 12 other systems are negative.   PHYSICAL EXAMINATION:  VITAL SIGNS:  Upon admission temperature 98, blood  pressure 153, pulse 89, respirations 20, saturating 94% on room air.  GENERAL:  Patient is mildly obese, in no acute distress, lying in bed and  sleeping.  HEENT:  Head is normocephalic, atraumatic.  Throat is clear.  Eyes have  pupils equal and round, react to light and accommodation.  Extraocular  movement was intact.  LUNGS:  Clear to auscultation bilaterally  without wheezes, rhonchi, or  crackles.  HEART:  Regular rate and rhythm without murmurs, rubs, or gallops.  NECK:  Without JVD.  SKIN:  Warm and moist.  There are no old appreciable rashes.  ABDOMEN:  Soft, nontender, nondistended.  Bowel sounds are present.  EXTREMITIES:  No edema.  NEUROLOGIC:  Nonfocal.  PSYCHIATRIC:  He has good memory, insight, affect, and orientation.   LABORATORIES:  Sodium 134, potassium 3.8, chloride 101, bicarb 26, BUN 21,  creatinine 1.1, glucose 165.  PT 13.4, INR 1, PTT 27.  White blood cell  count 8000, hemoglobin 10.9, hematocrit 32.5, platelet count 352.   ASSESSMENT/PLAN:  1.  Lower gastrointestinal bleed most likely secondary to diverticular      bleed.  Bring the patient to stepdown unit, closely monitor his CBC.      Transfuse 2 units of packed red blood cells because his baseline      hemoglobin is around 13.5, so he  already dropped 3 points.  If he      continues to bleed we will obtain a GI consultation for possible      colonoscopy.  2.  Postherpetic neuralgia.  Will continue with Neurontin.  3.  Hypertension.  We are going to hold the ACE inhibitor given the acute      bleeding and will continue Cardura which also helps with the benign      prostatic hyperplasia.  4.  Deep venous thrombosis prophylaxis will be done with PAS hose.  5.  Gastrointestinal prophylaxis with Protonix.      Edythe Lynn, M.D.  Electronically Signed     SL/MEDQ  D:  11/14/2004  T:  11/14/2004  Job:  [id number]   cc:   Lance Muss, M.D.  Fax: [phone]

## 2010-06-29 NOTE — Discharge Summary (Signed)
Gary [name], Gary [name] NO.:  [id number]   MEDICAL RECORD NO.:  [id number]          PATIENT TYPE:  INP   LOCATION:  2006                         FACILITY:  [facility]   PHYSICIAN:  Jacquelynn Cree, M.D.   DATE OF BIRTH:  1932-07-31   DATE OF ADMISSION:  11/13/2004  DATE OF DISCHARGE:  11/15/2004                                 DISCHARGE SUMMARY   PRIMARY CARE PHYSICIAN:  Lance Muss, M.D.   DISCHARGE DIAGNOSES:  1.  Diverticular gastrointestinal bleed.  2.  Anemia secondary to #1.  3.  Hypertension.  4.  Benign prostate hypertrophy.  5.  Herpes zoster with postherpetic neuralgia.  6.  Seasonal allergies.   DISCHARGE MEDICATIONS:  1.  Cardura 4 mg daily.  2.  Allegra 90 mg daily.  3.  Lisinopril 10 mg daily.  4.  Flonase daily.  5.  Neurontin 400 mg t.i.d.   CONSULTATIONS:  None.   PROCEDURES AND DIAGNOSTIC STUDIES:  None.   DISCHARGE LABORATORY VALUES:  Hemoglobin was 12.4, hematocrit 36.3, white  blood cell count 7.5, platelets 204.  Sodium was 138, potassium 4.0,  chloride 105, bicarb 26, BUN 11, creatinine 1.0, glucose 98.   BRIEF ADMISSION HISTORY OF PRESENT ILLNESS:  The patient is a 77 year old  male with a history of diverticulosis, who was admitted from the ER after  having an episode of bright red blood per rectum.  He was admitted for  further evaluation and observation.   HOSPITAL COURSE BY PROBLEM:  Problem 1.  LOWER GASTROINTESTINAL BLEED, LIKELY SECONDARY TO RECURRENT  DIVERTICULAR SOURCE:  The patient was admitted and remained Hemodynamically  stable over the course of his hospitalization.  His hemoglobin appropriately  rose to 12.6 after 2 units of packed red blood cells.  His hemoglobin was  stable for 24 hours and at the time of discharge is 12.4.  The patient  understands that he will return to the emergency department for any further  episodes of dizziness or bright red blood per rectum.   Problem 2.  HYPERTENSION:  The patient's  blood pressure remained well-  controlled throughout the course of his hospitalization.   Problem 3.  HERPES ZOSTER WITH POSTHERPETIC NEURALGIA:  The patient remained  stable on his normal home dose of Neurontin.  He has a follow-up appointment  with Dr. Nyoka Cowden in the morning.   Problem 4.  BENIGN PROSTATIC HYPERTROPHY:  The patient's urine output was  good during his course of hospitalization.   DISPOSITION:  The patient will be discharged home and have follow-up  appointment scheduled with Dr. Nyoka Cowden in the morning.  He has been  hemodynamically stable for 24 hours.  He understands that he should follow  up with his primary care physician and when to return to the hospital.           ______________________________  Jacquelynn Cree, M.D.     CR/MEDQ  D:  11/15/2004  T:  11/15/2004  Job:  [id number]

## 2010-06-29 NOTE — H&P (Signed)
NAMECOLEBY, BORGHESE NO.:  [id number]   MEDICAL RECORD NO.:  [id number]          PATIENT TYPE:  EMS   LOCATION:  ED                           FACILITY:  [facility]   PHYSICIAN:  Felicita Gage, MD     DATE OF BIRTH:  04-02-1932   DATE OF ADMISSION:  10/01/2004  DATE OF DISCHARGE:                                HISTORY & PHYSICAL   CHIEF COMPLAINT:  Chest pain x3-4 episodes today.   HISTORY OF PRESENT ILLNESS:  Mr. Freni is a very pleasant 77 year old  Caucasian gentleman looking younger than his stated age of 76, who was in  his usual state of health this evening after dinner when he suddenly felt  some sharp left chest pain in his left sternal border area, nonradiating.  This lasted a few minutes and resolved but he had 3-4 episodes and hence  came to the emergency room for evaluation.  The patient has not had any  history of coronary artery disease, however he does have a history of  hypertension and BPH.  The patient states that he has not had any cardiac or  noncardiac chest pain prior to this episode.  No shortness of breath,  diaphoresis associated with these symptoms.  No radiation, however the  patient does say that he has had a nagging pain at his left TMJ and he  thinks that in the last couple of days that pain has been worsened.  He also  has a pain in his left shoulder which he thinks is secondary to the way he  used his pillow, however he is not sure if this chest pain is related to the  shoulder pain as well.  The patient has no other complaints.  No nausea,  vomiting, diarrhea or any other systemic complaints at the present time.   PAST MEDICAL HISTORY:  1.  Hypertension.  2.  BPH.  3.  Nephrolithiasis.  4.  Lumbar fusion.  5.  Diverticulosis.   MEDICATIONS ON ADMISSION:  1.  Cardura 4 mg p.o. daily.  2.  Lisinopril 10 mg p.o. daily.  3.  Allegra 90 mg p.o. daily.   FAMILY HISTORY:  Noncontributory for coronary artery disease, diabetes  mellitus or hypertension.   SOCIAL HISTORY:  The patient lives with his wife.  No alcohol, tobacco or  drug use.   ALLERGIES:  The patient is allergic to Naprosyn.   REVIEW OF SYSTEMS:  Greater than 10 systems reviewed and other than HPI,  essentially negative.   PHYSICAL EXAMINATION:  Blood pressure 145/71, pulse 69, respirations 16,  temperature 98.4.  GENERAL:  Very pleasant, elderly Caucasian gentleman lying in bed in no  acute distress.  Alert and oriented x3.  Cooperative with the exam.  HEENT:  Normocephalic, atraumatic.  PERRL.  Sclerae anicteric.  Mucous  membranes moist.  Oropharynx nonerythematous.  NECK:  Supple.  No LAD, no JVD.  CARDIOVASCULAR:  S1 plus S2, regular rate and rhythm.  No murmurs, rubs or  gallops.  ABDOMEN:  Soft, positive bowel sounds. No tenderness, no masses.  LUNGS:  Clear to auscultation  bilaterally.  No wheezes, no rales.  EXTREMITIES:  No cyanosis, clubbing or edema.  NEUROLOGIC:  Nonfocal.   Chest x-ray:  Mild cardiomegaly.  Bibasilar atelectasis with elevation of  the right hemidiaphragm.  EKG:  Normal sinus rhythm.  No ischemic changes.   WBC 7.8, hemoglobin 13.2, hematocrit 37.6, platelets 253.  Sodium 134,  potassium 3.7, chloride 98, carbon dioxide 26, glucose 127, BUN 21,  creatinine 1.5.  Calcium 9.1.  Total protein 6.0, albumin 3.8, AST 24, ALT  18, alkaline phosphatase 31, total bilirubin 0.6.  Point of care cardiac  enzymes x2 negative.   ASSESSMENT/PLAN:  A 77 year old Caucasian gentleman presenting with chest  pain which could be unstable angina.  Rule out acute coronary syndrome.   Problem 1.  Chest pain.  We will get three sets of cardiac enzymes, start  patient on his home dose of lisinopril and add a low dose beta blocker.  We  will also add some Nitro paste.  At the time of exam, the patient is pain-  free and in light of his history of GI bleeding in 2004, we have held the  Lovenox for the present time, however he will be  given Lovenox at DVT  prophylaxis dose of 40 mg subcu daily.  We will check his lipid profile and  a TSH.  Patient may need a stress test in light of his history of  hypertension and his age at 46.  He states that he had a stress test done by  Dr. Nyoka Cowden last year.  We will obtain results of these tests and determine  whether he needs another stress test.   Problem 2. Hypertension.  We will resume his home dose of lisinopril, a low  dose of beta blocker has also been added.  His blood pressure on admission  was 145/71.   Problem 3.  Pain management.  We will have him on morphine p.r.n. chest pain  and Tylenol as well.   DISPOSITION:  After completion of workup, the patient will be discharged  home.      Felicita Gage, MD  Electronically Signed     GDK/MEDQ  D:  10/01/2004  T:  10/02/2004  Job:  [id number]   cc:   Lance Muss, M.D.  [address] [place]   [id number]  Fax: [phone]

## 2010-08-02 ENCOUNTER — Telehealth: Payer: Self-pay | Admitting: Gastroenterology

## 2010-08-02 NOTE — Telephone Encounter (Signed)
Pt c/o liquid stools, he has tried immodium but it did not help. Has also tried dietary changes. Pt requesting to be seen. Appt made for pt to see Tye Savoy NP 08/07/10@3pm . Pt aware of appt date and time.

## 2010-08-06 ENCOUNTER — Telehealth: Payer: Self-pay | Admitting: Nurse Practitioner

## 2010-08-06 NOTE — Telephone Encounter (Signed)
Pt has an appt to see Tye Savoy NP tomorrow. Pt is c/o abdominal pain and some loose stools. States the pain is above his belly button in the center and he rates the pain at a 7 or 8, he states it is constant. Pt requesting something for the pain. Dr. Deatra Ina please advise.

## 2010-08-06 NOTE — Telephone Encounter (Signed)
Ultram 50-100mg  q6h prn #15

## 2010-08-06 NOTE — Telephone Encounter (Signed)
Pt called back and states that his pain has stopped and to disregard his message, he will keep his appt with the NP tomorrow.

## 2010-08-07 ENCOUNTER — Ambulatory Visit (INDEPENDENT_AMBULATORY_CARE_PROVIDER_SITE_OTHER): Payer: Medicare Other | Admitting: Nurse Practitioner

## 2010-08-07 ENCOUNTER — Encounter: Payer: Self-pay | Admitting: Nurse Practitioner

## 2010-08-07 DIAGNOSIS — R1013 Epigastric pain: Secondary | ICD-10-CM

## 2010-08-07 DIAGNOSIS — R140 Abdominal distension (gaseous): Secondary | ICD-10-CM

## 2010-08-07 DIAGNOSIS — R141 Gas pain: Secondary | ICD-10-CM

## 2010-08-07 DIAGNOSIS — R195 Other fecal abnormalities: Secondary | ICD-10-CM

## 2010-08-07 MED ORDER — OMEPRAZOLE 20 MG PO CPDR: 20.0000 mg | DELAYED_RELEASE_CAPSULE | Freq: Every day | ORAL | Status: DC

## 2010-08-07 MED ORDER — METRONIDAZOLE 500 MG PO TABS: ORAL_TABLET | ORAL | Status: DC

## 2010-08-07 NOTE — Patient Instructions (Addendum)
We have given you samples of Prilosec OTC, :Take 1 capsule 30 min before breakfast. We sent a prescription for Flagyl ( Metronidazol). To [facility].  Make an appointment with Dr. Deatra Ina for 4 weeks out.

## 2010-08-07 NOTE — Assessment & Plan Note (Addendum)
Multiple upper gastrointestinal complaints including occasional epigastric pain (severe episode yesterday), intermittent am nausea, abdominal bloating, excessive flatus. His symptoms could be multi-factorial (functional bowel, gallbladder disease, small bowel bacterial overgrowth, gastritis, PUD,  Etc...). Recommended a trial of PPI,  trial of Xifaxan (or Flagyl depending on cost). Will obtain ultrasound of the gallbladder, basic laboratory studies. Patient will return to see Dr Deatra Ina for reassessment in approximately 4 weeks.

## 2010-08-07 NOTE — Progress Notes (Signed)
Gary [name] [id number] 1932/09/17   HISTORY OR PRESENT ILLNESS : Patient is a 77 year old male who is s/p subtotal colectomy with ileoproctostomy Dec. 201 for pancolonic diverticulosis and recurrent GI bleeding on Plavix and ASA. Gary [name] comes in today for evaluation of abdominal pain. After eating grapes yesterday patient developed acute non-radiating epigastric pain. The pain was constant for 6 hours, was 8/10 on pain scale. Patient took Tylenol, pain eventually subsided. He slept well last night, no pain today. No associated nausea or SOB. Patient has had similar pain in the past but certainly not to this degree. He has chronic back pain which seems to be be worse lately.Occasionally takes Ibuprofen. Patient also gives a one week history of loose stools which actually began to solidify today. He complains of frequent bloating, excessive flatus. He has occasional heartburn, maybe once a week. Tums help, diet coke does too.    Current Medications, Allergies, Past Medical History, Past Surgical History, Family History and Social History were reviewed in [employer] record.   PHYSICAL EXAMINATION : General: Well developed white male in no acute distress Head: Normocephalic and atraumatic Eyes:  sclerae anicteric,conjunctive pink. Ears: Normal auditory acuity Mouth: No deformity or lesions Neck: Supple, no masses.  Lungs: Clear throughout to auscultation Heart: Regular rate and rhythm; no murmurs heard Abdomen: Soft, nondistended, nontender. No masses or hepatomegaly noted. Normal bowel sounds Rectal: Not done Musculoskeletal: Symmetrical with no gross deformities  Skin: No lesions on visible extremities Extremities: No edema or deformities noted Neurological: Alert oriented x 4, grossly nonfocal Cervical Nodes:  No significant cervical adenopathy Psychological:  Alert and cooperative. Normal mood and affect  ASSESSMENT AND PLAN :

## 2010-08-08 ENCOUNTER — Encounter: Payer: Self-pay | Admitting: Nurse Practitioner

## 2010-08-09 NOTE — Progress Notes (Signed)
Agree with initial assessment and plan 

## 2010-08-13 ENCOUNTER — Other Ambulatory Visit (HOSPITAL_COMMUNITY): Payer: Medicare Other

## 2010-08-16 ENCOUNTER — Ambulatory Visit (HOSPITAL_COMMUNITY)
Admission: RE | Admit: 2010-08-16 | Discharge: 2010-08-16 | Disposition: A | Discharge status: Home / Self Care | Payer: Medicare Other | Source: Ambulatory Visit | Attending: Nurse Practitioner | Admitting: Nurse Practitioner

## 2010-08-16 ENCOUNTER — Telehealth: Payer: Self-pay | Admitting: *Deleted

## 2010-08-16 DIAGNOSIS — R142 Eructation: Secondary | ICD-10-CM | POA: Insufficient documentation

## 2010-08-16 DIAGNOSIS — R195 Other fecal abnormalities: Secondary | ICD-10-CM

## 2010-08-16 DIAGNOSIS — R1013 Epigastric pain: Secondary | ICD-10-CM | POA: Insufficient documentation

## 2010-08-16 DIAGNOSIS — R140 Abdominal distension (gaseous): Secondary | ICD-10-CM

## 2010-08-16 DIAGNOSIS — R141 Gas pain: Secondary | ICD-10-CM | POA: Insufficient documentation

## 2010-08-16 DIAGNOSIS — K838 Other specified diseases of biliary tract: Secondary | ICD-10-CM | POA: Insufficient documentation

## 2010-08-16 NOTE — Telephone Encounter (Signed)
Patient given results. He is doing better last few days. Scheduled patient to see Dr. Deatra Ina on 09/11/10 at 3:00 PM

## 2010-08-16 NOTE — Telephone Encounter (Signed)
Left a message for patient to call me. 

## 2010-08-16 NOTE — Telephone Encounter (Signed)
Message copied by Hulan Saas on Thu Aug 16, 2010 11:05 AM ------      Message from: Willia Craze      Created: Thu Aug 16, 2010 10:59 AM       Rollene Fare, please see how patient feels and let him know that ultrasound was okay except for biliary sludge which probably has nothing to do with his pain. He should already have an office follow up. Thanks

## 2010-09-11 ENCOUNTER — Encounter: Payer: Self-pay | Admitting: Gastroenterology

## 2010-09-11 ENCOUNTER — Ambulatory Visit (INDEPENDENT_AMBULATORY_CARE_PROVIDER_SITE_OTHER): Payer: Medicare Other | Admitting: Gastroenterology

## 2010-09-11 DIAGNOSIS — R195 Other fecal abnormalities: Secondary | ICD-10-CM

## 2010-09-11 DIAGNOSIS — R1013 Epigastric pain: Secondary | ICD-10-CM

## 2010-09-11 NOTE — Progress Notes (Signed)
History of Present Illness:  Gary [name] has returned for followup of his abdominal pain and diarrhea. Symptoms have subsided.  He has occasional loose stools that varies with his diet. He received a course of antibiotics including Flagyl.   Review of Systems: Pertinent positive and negative review of systems were noted in the above HPI section. All other review of systems were otherwise negative.    Current Medications, Allergies, Past Medical History, Past Surgical History, Family History and Social History were reviewed in [facility] record  Vital signs were reviewed in today's medical record. Physical Exam: General: Well developed , well nourished, no acute distress

## 2010-09-11 NOTE — Assessment & Plan Note (Signed)
No specific etiology although he may have had a mild gastritis. Symptoms have resolved.

## 2010-09-11 NOTE — Patient Instructions (Signed)
Follow up as needed

## 2010-09-11 NOTE — Assessment & Plan Note (Signed)
Status post subtotal colectomy. Patient was instructed to use Imodium as needed.

## 2010-09-26 ENCOUNTER — Other Ambulatory Visit: Payer: Self-pay | Admitting: Family Medicine

## 2010-09-26 DIAGNOSIS — M545 Low back pain: Secondary | ICD-10-CM

## 2010-10-05 ENCOUNTER — Other Ambulatory Visit: Payer: Medicare Other

## 2010-10-11 MED ORDER — DIAZEPAM 2 MG PO TABS: 5.0000 mg | ORAL_TABLET | Freq: Once | ORAL | Status: DC

## 2010-10-11 MED ORDER — DIAZEPAM 2 MG PO TABS
5.0000 mg | ORAL_TABLET | Freq: Once | ORAL | Status: AC
  Administered 2010-10-12: 5 mg via ORAL

## 2010-10-12 ENCOUNTER — Ambulatory Visit
Admission: RE | Admit: 2010-10-12 | Discharge: 2010-10-12 | Disposition: A | Discharge status: Home / Self Care | Payer: Medicare Other | Source: Ambulatory Visit | Attending: Family Medicine | Admitting: Family Medicine

## 2010-10-12 VITALS — BP 104/35 | HR 60

## 2010-10-12 DIAGNOSIS — M545 Low back pain: Secondary | ICD-10-CM

## 2010-10-12 MED ORDER — IOHEXOL 180 MG/ML  SOLN
17.0000 mL | Freq: Once | INTRAMUSCULAR | Status: AC | PRN
  Administered 2010-10-12: 17 mL via INTRAVENOUS

## 2010-10-12 NOTE — Progress Notes (Signed)
To CT.  Repositioned for comfort just before transport.  jkl

## 2010-11-16 LAB — COMPREHENSIVE METABOLIC PANEL
Albumin: 4.4 g/dL (ref 3.5–5.2)
Alkaline Phosphatase: 32 U/L — ABNORMAL LOW (ref 39–117)
BUN: 23 mg/dL (ref 6–23)
CO2: 26 mEq/L (ref 19–32)
Chloride: 99 mEq/L (ref 96–112)
Creatinine, Ser: 1.46 mg/dL (ref 0.4–1.5)
GFR calc non Af Amer: 47 mL/min — ABNORMAL LOW (ref >60)
Glucose, Bld: 110 mg/dL — ABNORMAL HIGH (ref 70–99)
Potassium: 4.4 mEq/L (ref 3.5–5.1)
Total Bilirubin: 0.8 mg/dL (ref 0.3–1.2)

## 2010-11-16 LAB — POCT CARDIAC MARKERS
Myoglobin, poc: 140 ng/mL (ref 12–200)
Troponin i, poc: <0.05 ng/mL (ref 0.00–0.09)

## 2010-11-16 LAB — CBC
HCT: 44.6 % (ref 39.0–52.0)
Hemoglobin: 14.8 g/dL (ref 13.0–17.0)
MCV: 90.2 fL (ref 78.0–100.0)
RBC: 4.94 MIL/uL (ref 4.22–5.81)
WBC: 7.8 10*3/uL (ref 4.0–10.5)

## 2010-11-16 LAB — DIFFERENTIAL
Basophils Absolute: 0 10*3/uL (ref 0.0–0.1)
Basophils Relative: 0 % (ref 0–1)
Monocytes Absolute: 0.5 10*3/uL (ref 0.1–1.0)
Neutro Abs: 6.3 10*3/uL (ref 1.7–7.7)
Neutrophils Relative %: 81 % — ABNORMAL HIGH (ref 43–77)

## 2010-11-16 LAB — URINALYSIS, ROUTINE W REFLEX MICROSCOPIC
Bilirubin Urine: NEGATIVE
Glucose, UA: NEGATIVE mg/dL
Ketones, ur: NEGATIVE mg/dL
pH: 6 (ref 5.0–8.0)

## 2010-11-16 LAB — B-NATRIURETIC PEPTIDE (CONVERTED LAB): Pro B Natriuretic peptide (BNP): <30 pg/mL (ref 0.0–100.0)

## 2010-11-16 LAB — POCT I-STAT, CHEM 8
Calcium, Ion: 1.01 mmol/L — ABNORMAL LOW (ref 1.12–1.32)
Chloride: 104 mEq/L (ref 96–112)
HCT: 46 % (ref 39.0–52.0)
Sodium: 135 mEq/L (ref 135–145)

## 2011-01-20 ENCOUNTER — Other Ambulatory Visit: Payer: Self-pay

## 2011-01-20 ENCOUNTER — Emergency Department (HOSPITAL_COMMUNITY): Payer: Medicare Other

## 2011-01-20 ENCOUNTER — Emergency Department (HOSPITAL_COMMUNITY)
Admission: EM | Admit: 2011-01-20 | Discharge: 2011-01-20 | Disposition: A | Discharge status: Home / Self Care | Payer: Medicare Other | Source: Home / Self Care | Attending: Emergency Medicine | Admitting: Emergency Medicine

## 2011-01-20 ENCOUNTER — Encounter (HOSPITAL_COMMUNITY): Payer: Self-pay | Admitting: Emergency Medicine

## 2011-01-20 DIAGNOSIS — I129 Hypertensive chronic kidney disease with stage 1 through stage 4 chronic kidney disease, or unspecified chronic kidney disease: Secondary | ICD-10-CM | POA: Insufficient documentation

## 2011-01-20 DIAGNOSIS — R509 Fever, unspecified: Secondary | ICD-10-CM | POA: Insufficient documentation

## 2011-01-20 DIAGNOSIS — N189 Chronic kidney disease, unspecified: Secondary | ICD-10-CM | POA: Insufficient documentation

## 2011-01-20 DIAGNOSIS — R197 Diarrhea, unspecified: Secondary | ICD-10-CM

## 2011-01-20 DIAGNOSIS — K567 Ileus, unspecified: Secondary | ICD-10-CM

## 2011-01-20 DIAGNOSIS — R109 Unspecified abdominal pain: Secondary | ICD-10-CM | POA: Insufficient documentation

## 2011-01-20 DIAGNOSIS — E785 Hyperlipidemia, unspecified: Secondary | ICD-10-CM | POA: Insufficient documentation

## 2011-01-20 DIAGNOSIS — R51 Headache: Secondary | ICD-10-CM | POA: Insufficient documentation

## 2011-01-20 DIAGNOSIS — I252 Old myocardial infarction: Secondary | ICD-10-CM | POA: Insufficient documentation

## 2011-01-20 DIAGNOSIS — K560 Paralytic ileus: Secondary | ICD-10-CM | POA: Insufficient documentation

## 2011-01-20 DIAGNOSIS — R112 Nausea with vomiting, unspecified: Secondary | ICD-10-CM | POA: Insufficient documentation

## 2011-01-20 DIAGNOSIS — Z87442 Personal history of urinary calculi: Secondary | ICD-10-CM | POA: Insufficient documentation

## 2011-01-20 LAB — COMPREHENSIVE METABOLIC PANEL
Albumin: 3.9 g/dL (ref 3.5–5.2)
BUN: 35 mg/dL — ABNORMAL HIGH (ref 6–23)
Creatinine, Ser: 1.48 mg/dL — ABNORMAL HIGH (ref 0.50–1.35)
Total Bilirubin: 0.5 mg/dL (ref 0.3–1.2)
Total Protein: 6.9 g/dL (ref 6.0–8.3)

## 2011-01-20 LAB — DIFFERENTIAL
Basophils Relative: 0 % (ref 0–1)
Eosinophils Absolute: 0 10*3/uL (ref 0.0–0.7)
Eosinophils Relative: 0 % (ref 0–5)
Monocytes Absolute: 0.7 10*3/uL (ref 0.1–1.0)
Monocytes Relative: 8 % (ref 3–12)

## 2011-01-20 LAB — CBC
HCT: 45.9 % (ref 39.0–52.0)
Hemoglobin: 15.5 g/dL (ref 13.0–17.0)
MCH: 30.5 pg (ref 26.0–34.0)
MCHC: 33.8 g/dL (ref 30.0–36.0)

## 2011-01-20 MED ORDER — ONDANSETRON HCL 4 MG/2ML IJ SOLN: 4.0000 mg | Freq: Once | INTRAMUSCULAR | Status: DC

## 2011-01-20 MED ORDER — MORPHINE SULFATE 2 MG/ML IJ SOLN
INTRAMUSCULAR | Status: AC
  Administered 2011-01-20: 4 mg via INTRAVENOUS
  Filled 2011-01-20: qty 2

## 2011-01-20 MED ORDER — SODIUM CHLORIDE 0.9 % IV BOLUS (SEPSIS)
1000.0000 mL | Freq: Once | INTRAVENOUS | Status: AC
  Administered 2011-01-20: 1000 mL via INTRAVENOUS

## 2011-01-20 MED ORDER — ONDANSETRON HCL 4 MG/2ML IJ SOLN
4.0000 mg | Freq: Once | INTRAMUSCULAR | Status: AC
  Administered 2011-01-20: 4 mg via INTRAVENOUS
  Filled 2011-01-20: qty 2

## 2011-01-20 MED ORDER — MORPHINE SULFATE 4 MG/ML IJ SOLN: 4.0000 mg | Freq: Once | INTRAMUSCULAR | Status: DC

## 2011-01-20 MED ORDER — ONDANSETRON 4 MG PO TBDP: 4.0000 mg | ORAL_TABLET | Freq: Three times a day (TID) | ORAL | Status: AC | PRN

## 2011-01-20 MED ORDER — IOHEXOL 300 MG/ML  SOLN
100.0000 mL | Freq: Once | INTRAMUSCULAR | Status: AC | PRN
  Administered 2011-01-20: 100 mL via INTRAVENOUS

## 2011-01-20 MED ORDER — ONDANSETRON HCL 4 MG/2ML IJ SOLN
INTRAMUSCULAR | Status: AC
  Administered 2011-01-20: 4 mg
  Filled 2011-01-20: qty 2

## 2011-01-20 NOTE — ED Provider Notes (Signed)
Medical screening examination/treatment/procedure(s) were performed by non-physician practitioner and as supervising physician I was immediately available for consultation/collaboration.   Trisha Mangle, MD 01/20/11 [phone]

## 2011-01-20 NOTE — ED Provider Notes (Signed)
History     CSN: [id number] Arrival date & time: 01/20/2011  1:29 AM   First MD Initiated Contact with Patient 01/20/11 0154      Chief Complaint  Patient presents with  . Nausea  . Emesis    Patient is a 77 y.o. male presenting with vomiting.  Emesis  Associated symptoms include abdominal pain, chills, diarrhea, a fever and headaches. Pertinent negatives include no cough.    History provided by the patient. Patient presents with complaints of acute onset of nausea vomiting and diarrhea began Friday night and all day Saturday. Patient reports not be able to keep down any food or fluids. Also complaining of sharp cramping abdominal pains. Patient has history of severe diverticulosis with colon resection in 2011. Symptoms are worse with eating or drinking.  Pt denies any alleviating factors.  Patient reports feeling some subjective fevers and chills. Patient denies any hematemesis, melena, rectal bleeding.  Pt does not report any known sick contacts.      Past Medical History  Diagnosis Date  . Heart attack 2011  . Previous back surgery 1997  . Hypertension   . Arthritis   . Hemorrhoids   . Chronic kidney disease   . Kidney stones   . Dyslipidemia   . Diverticulosis   . Colon polyps     Past Surgical History  Procedure Date  . Eye surgery 1942  . Back surgery 1997  . Colon surgery     COLONOSCOPY  . Subtotal colectomy     2011    Family History  Problem Relation Age of Onset  . Heart disease Father   . Colon cancer Neg Hx   . Pancreatic cancer Mother     History  Substance Use Topics  . Smoking status: Never Smoker   . Smokeless tobacco: Not on file  . Alcohol Use: No      Review of Systems  Constitutional: Positive for fever, chills, appetite change and fatigue.  HENT: Negative for congestion and rhinorrhea.   Respiratory: Negative for cough, chest tightness and shortness of breath.   Cardiovascular: Negative for chest pain.  Gastrointestinal:  Positive for nausea, vomiting, abdominal pain and diarrhea. Negative for constipation and blood in stool.  Neurological: Positive for light-headedness and headaches.  All other systems reviewed and are negative.    Allergies  Clopidogrel bisulfate; Sulfonamide derivatives; and Naproxen  Home Medications   Current Outpatient Rx  Name Route Sig Dispense Refill  . ACETAMINOPHEN 500 MG PO TABS Oral Take 500 mg by mouth every 6 (six) hours as needed.      . ASPIRIN 81 MG PO TABS Oral Take 81 mg by mouth daily.      Marland Kitchen CITALOPRAM HYDROBROMIDE 20 MG PO TABS Oral Take 20 mg by mouth daily.      Marland Kitchen DOXAZOSIN MESYLATE PO Oral Take 8 mg by mouth daily.      Marland Kitchen FEXOFENADINE HCL 180 MG PO TABS Oral Take 180 mg by mouth daily.      Marland Kitchen FLUTICASONE PROPIONATE 50 MCG/ACT NA SUSP Nasal 2 sprays by Nasal route daily.      Marland Kitchen GABAPENTIN 400 MG PO CAPS Oral Take 400 mg by mouth 3 (three) times daily.      Marland Kitchen HYDROCODONE-ACETAMINOPHEN 5-325 MG PO TABS Oral Take 1 tablet by mouth every 6 (six) hours as needed.      Marland Kitchen LISINOPRIL 10 MG PO TABS Oral Take 10 mg by mouth daily.      Marland Kitchen  NEBIVOLOL HCL 5 MG PO TABS Oral Take 5 mg by mouth daily.      Marland Kitchen OMEPRAZOLE 20 MG PO CPDR Oral Take 1 capsule (20 mg total) by mouth daily. 30 capsule 1    Take 1 capsule 30 min before breakfast. Lot# [id number] ...  . SIMVASTATIN 40 MG PO TABS Oral Take 40 mg by mouth at bedtime.      . TESTOSTERONE 50 MG/5GM TD GEL Transdermal Place 5 g onto the skin daily.      . TRAMADOL-ACETAMINOPHEN 37.5-325 MG PO TABS Oral Take 1 tablet by mouth every 6 (six) hours as needed.      Marland Kitchen NITROGLYCERIN 0.4 MG SL SUBL Sublingual Place 0.4 mg under the tongue every 5 (five) minutes as needed.        BP 137/67  Pulse 78  Temp(Src) 99.1 F (37.3 C) (Oral)  Resp 18  SpO2 95%  Physical Exam  Nursing note and vitals reviewed. Constitutional: He is oriented to person, place, and time. He appears well-developed and well-nourished. No distress.  HENT:  Head:  Normocephalic.  Mouth/Throat: Oropharynx is clear and moist.  Neck: Normal range of motion. Neck supple.  Cardiovascular: Normal rate, regular rhythm and normal heart sounds.   Abdominal: Soft. He exhibits distension. He exhibits no mass. There is tenderness. There is no rebound and no guarding.  Musculoskeletal: He exhibits no edema and no tenderness.  Neurological: He is alert and oriented to person, place, and time.  Skin: Skin is warm.  Psychiatric: He has a normal mood and affect. His behavior is normal.    ED Course  Procedures (including critical care time)  Labs Reviewed  DIFFERENTIAL - Abnormal; Notable for the following:    Neutrophils Relative 85 (*)    Neutro Abs 7.9 (*)    Lymphocytes Relative 7 (*)    Lymphs Abs 0.6 (*)    All other components within normal limits  COMPREHENSIVE METABOLIC PANEL - Abnormal; Notable for the following:    Glucose, Bld 143 (*)    BUN 35 (*)    Creatinine, Ser 1.48 (*)    Alkaline Phosphatase 31 (*)    GFR calc non Af Amer 44 (*)    GFR calc Af Amer 51 (*)    All other components within normal limits  CBC   Results for orders placed during the hospital encounter of 01/20/11  CBC      Component Value Range   WBC 9.3  4.0 - 10.5 (K/uL)   RBC 5.09  4.22 - 5.81 (MIL/uL)   Hemoglobin 15.5  13.0 - 17.0 (g/dL)   HCT 45.9  39.0 - 52.0 (%)   MCV 90.2  78.0 - 100.0 (fL)   MCH 30.5  26.0 - 34.0 (pg)   MCHC 33.8  30.0 - 36.0 (g/dL)   RDW 13.4  11.5 - 15.5 (%)   Platelets 163  150 - 400 (K/uL)  DIFFERENTIAL      Component Value Range   Neutrophils Relative 85 (*) 43 - 77 (%)   Neutro Abs 7.9 (*) 1.7 - 7.7 (K/uL)   Lymphocytes Relative 7 (*) 12 - 46 (%)   Lymphs Abs 0.6 (*) 0.7 - 4.0 (K/uL)   Monocytes Relative 8  3 - 12 (%)   Monocytes Absolute 0.7  0.1 - 1.0 (K/uL)   Eosinophils Relative 0  0 - 5 (%)   Eosinophils Absolute 0.0  0.0 - 0.7 (K/uL)   Basophils Relative 0  0 - 1 (%)  Basophils Absolute 0.0  0.0 - 0.1 (K/uL)    COMPREHENSIVE METABOLIC PANEL      Component Value Range   Sodium 138  135 - 145 (mEq/L)   Potassium 3.7  3.5 - 5.1 (mEq/L)   Chloride 98  96 - 112 (mEq/L)   CO2 29  19 - 32 (mEq/L)   Glucose, Bld 143 (*) 70 - 99 (mg/dL)   BUN 35 (*) 6 - 23 (mg/dL)   Creatinine, Ser 1.48 (*) 0.50 - 1.35 (mg/dL)   Calcium 9.1  8.4 - 10.5 (mg/dL)   Total Protein 6.9  6.0 - 8.3 (g/dL)   Albumin 3.9  3.5 - 5.2 (g/dL)   AST 30  0 - 37 (U/L)   ALT 19  0 - 53 (U/L)   Alkaline Phosphatase 31 (*) 39 - 117 (U/L)   Total Bilirubin 0.5  0.3 - 1.2 (mg/dL)   GFR calc non Af Amer 44 (*) >90 (mL/min)   GFR calc Af Amer 51 (*) >90 (mL/min)      Ct Abdomen Pelvis W Contrast  01/20/2011  *RADIOLOGY REPORT*  Clinical Data: Abdominal pain, nausea, vomiting, diarrhea.  History of colectomy.  CT ABDOMEN AND PELVIS WITH CONTRAST  Technique:  Multidetector CT imaging of the abdomen and pelvis was performed following the standard protocol during bolus administration of intravenous contrast.  Contrast: 158mL OMNIPAQUE IOHEXOL 300 MG/ML IV SOLN  Comparison: 01/19/2010  Findings: Infiltration or atelectasis in the right lung base with dependent atelectasis in the left lung base.  Calcified granulomas in the spleen.  The liver, gallbladder, pancreas, adrenal glands, kidneys, and retroperitoneal lymph nodes are unremarkable. Vascular calcifications in the aorta without aneurysm.  Mild thickening of the wall of the distal esophagus possibly due to under distension or inflammatory esophagitis.  No free fluid or free air in the abdomen.  Prominent visceral adipose tissue.  Postoperative changes consistent with some total colectomy with ileal sigmoid anastomoses.  The  distal small bowel are mildly distended and filled with fluid and gas.  There is no wall thickening.  The anastomosis appears patent.  Changes appear to extend to the rectum, suggesting an ileus rather than obstruction. No obstructing lesion is visualized.  Pelvis:   Calcifications in the prostate gland.  No bladder wall thickening.  No free or loculated pelvic fluid collections.  No pelvic lymphadenopathy.  Postoperative changes and degenerative changes in the lumbar spine.  IMPRESSION: Postoperative changes with some total colectomy and ileal sigmoid anastomoses.  Mild distension with fluid filled small bowel loops extending to the anus.  Although anal obstruction is not excluded, changes more likely represent ileus.  No significant wall thickening.  Original Report Authenticated By: Neale Burly, M.D.     1. Nausea vomiting and diarrhea   2. Ileus       MDM  2:00 AM patient seen and evaluated. Patient in no acute distress.   Pt discussed with Attending Physician.  Will give IV bolus for slight renal insufficiency.  Pt already having improvement of pain.  Plan to d/c home with clear liquid diet and bowel rest.  Pt to slowly increase diet and follow up with PCP on Monday.    Date: 01/20/2011  Rate: 75  Rhythm: normal sinus rhythm  QRS Axis: normal  Intervals: normal  ST/T Wave abnormalities: normal  Conduction Disutrbances:none  Narrative Interpretation:   Old EKG Reviewed: unchangedFrom 01/26/2010    Martie Lee, PA 01/20/11 1733

## 2011-01-20 NOTE — ED Notes (Addendum)
Per EMS: pt reports 24 hours of nausea vomiting and diarrhea; #18 in left AC and received 4mg  of Zofran IVP, 243ml of NS

## 2011-01-21 ENCOUNTER — Emergency Department (HOSPITAL_COMMUNITY)
Admission: EM | Admit: 2011-01-21 | Discharge: 2011-01-21 | Disposition: A | Discharge status: Home / Self Care | Payer: Medicare Other | Source: Home / Self Care | Attending: Emergency Medicine | Admitting: Emergency Medicine

## 2011-01-21 ENCOUNTER — Encounter (HOSPITAL_COMMUNITY): Payer: Self-pay

## 2011-01-21 DIAGNOSIS — R197 Diarrhea, unspecified: Secondary | ICD-10-CM

## 2011-01-21 MED ORDER — SODIUM CHLORIDE 0.9 % IV BOLUS (SEPSIS)
1000.0000 mL | Freq: Once | INTRAVENOUS | Status: AC
  Administered 2011-01-21: 1000 mL via INTRAVENOUS

## 2011-01-21 NOTE — ED Notes (Signed)
Pt had a bowel movement, diarrhea noted, pt cleaned up. Wife at bedside and vomited, Assisted wife cleaning up as well. Pt repositioned, second bolus bag hung. No change in status noted. Will continue to monitor.

## 2011-01-21 NOTE — ED Notes (Signed)
[id number] Expected date:01/21/11<BR> Expected time: 3:32 AM<BR> Means of arrival:Ambulance<BR> Comments:<BR> Male N/V

## 2011-01-21 NOTE — ED Notes (Addendum)
Pt had another bowel movement with diarrhea, pt cleaned again. MD at bedside reevaluating patient.

## 2011-01-21 NOTE — ED Notes (Signed)
Daughter number: [phone]

## 2011-01-21 NOTE — ED Notes (Signed)
Pt here yest for n/v/d.  Diarrhea remains and pt unable to control at home.  Pt c/o pain in upper abdomen.  Wife also here with same.

## 2011-01-21 NOTE — ED Provider Notes (Signed)
History     CSN: [id number] Arrival date & time: 01/21/2011  4:06 AM   First MD Initiated Contact with Patient 01/21/11 [phone]      Chief Complaint  Patient presents with  . GI Problem    (Consider location/radiation/quality/duration/timing/severity/associated sxs/prior treatment) HPI This is a 77 year old white male with a history of subtotal colectomy. He was seen here 3 evenings ago for nausea, vomiting and diarrhea. He also had abdominal cramping along with this. He had a workup then included a CT scan that showed a mild ileus but no other acute pathology. His anastomoses appeared intact. He was discharged home after hydration. He returns due to severe diarrhea. He states his been going hourly since returning home, passing large amounts of liquid stool. He no longer is having nausea or vomiting. He has some mild mid abdominal pain remaining. Past Medical History  Diagnosis Date  . Heart attack 2011  . Previous back surgery 1997  . Hypertension   . Arthritis   . Hemorrhoids   . Chronic kidney disease   . Kidney stones   . Dyslipidemia   . Diverticulosis   . Colon polyps     Past Surgical History  Procedure Date  . Eye surgery 1942  . Back surgery 1997  . Colon surgery     COLONOSCOPY  . Subtotal colectomy     2011    Family History  Problem Relation Age of Onset  . Heart disease Father   . Colon cancer Neg Hx   . Pancreatic cancer Mother     History  Substance Use Topics  . Smoking status: Never Smoker   . Smokeless tobacco: Not on file  . Alcohol Use: No      Review of Systems  All other systems reviewed and are negative.    Allergies  Clopidogrel bisulfate; Sulfonamide derivatives; and Naproxen  Home Medications   Current Outpatient Rx  Name Route Sig Dispense Refill  . ACETAMINOPHEN 500 MG PO TABS Oral Take 500 mg by mouth every 6 (six) hours as needed.      . ASPIRIN 81 MG PO TABS Oral Take 81 mg by mouth daily.      Marland Kitchen CITALOPRAM  HYDROBROMIDE 20 MG PO TABS Oral Take 20 mg by mouth daily.      Marland Kitchen DOXAZOSIN MESYLATE PO Oral Take 8 mg by mouth daily.      Marland Kitchen FEXOFENADINE HCL 180 MG PO TABS Oral Take 180 mg by mouth daily.      Marland Kitchen FLUTICASONE PROPIONATE 50 MCG/ACT NA SUSP Nasal 2 sprays by Nasal route daily.      Marland Kitchen GABAPENTIN 400 MG PO CAPS Oral Take 400 mg by mouth 3 (three) times daily.      Marland Kitchen HYDROCODONE-ACETAMINOPHEN 5-325 MG PO TABS Oral Take 1 tablet by mouth every 6 (six) hours as needed.      Marland Kitchen LISINOPRIL 10 MG PO TABS Oral Take 10 mg by mouth daily.      . NEBIVOLOL HCL 5 MG PO TABS Oral Take 5 mg by mouth daily.      Marland Kitchen OMEPRAZOLE 20 MG PO CPDR Oral Take 1 capsule (20 mg total) by mouth daily. 30 capsule 1    Take 1 capsule 30 min before breakfast. Lot# [id number] ...  . ONDANSETRON 4 MG PO TBDP Oral Take 1 tablet (4 mg total) by mouth every 8 (eight) hours as needed for nausea. 20 tablet 0  . SIMVASTATIN 40 MG PO TABS Oral Take 40  mg by mouth at bedtime.      . TESTOSTERONE 50 MG/5GM TD GEL Transdermal Place 5 g onto the skin daily.      . TRAMADOL-ACETAMINOPHEN 37.5-325 MG PO TABS Oral Take 1 tablet by mouth every 6 (six) hours as needed.      Marland Kitchen NITROGLYCERIN 0.4 MG SL SUBL Sublingual Place 0.4 mg under the tongue every 5 (five) minutes as needed.        BP 135/65  Pulse 79  Temp(Src) 98.2 F (36.8 C) (Oral)  Resp 18  SpO2 96%  Physical Exam General: Well-developed, well-nourished male in no acute distress; appearance consistent with age of record HENT: normocephalic, atraumatic Eyes: pupils equal round and reactive to light; extraocular muscles intact; arcus senilis bilaterally Neck: supple Heart: regular rate and rhythm Lungs: clear to auscultation bilaterally Abdomen: soft; mild epigastric tenderness; nondistended; bowel sounds present Extremities: No deformity; full range of motion; pulses normal Neurologic: Awake, alert and oriented; motor function intact in all extremities and symmetric; no facial  droop Skin: Warm and dry Psychiatric: Normal mood and affect    ED Course  Procedures (including critical care time)    MDM  7:54 AM IV fluid bolus infusing. Patient will be given a total of 2 L and then reassessed by Dr. Vanita Panda who will make disposition.         Wynetta Fines, MD 01/21/11 [phone]

## 2011-01-21 NOTE — ED Provider Notes (Signed)
9:37 AM Patient notes that he is feeling better, though he continues to have mild diarrhea.  He requests discharge and this will be accommodated.  Oswaldo Done, MD 01/21/11 [phone]

## 2011-01-22 ENCOUNTER — Emergency Department (HOSPITAL_COMMUNITY): Payer: Medicare Other

## 2011-01-22 ENCOUNTER — Encounter (HOSPITAL_COMMUNITY): Payer: Self-pay | Admitting: Emergency Medicine

## 2011-01-22 ENCOUNTER — Other Ambulatory Visit: Payer: Self-pay

## 2011-01-22 ENCOUNTER — Inpatient Hospital Stay (HOSPITAL_COMMUNITY)
Admission: EM | Admit: 2011-01-22 | Discharge: 2011-02-04 | DRG: 444 | Disposition: A | Discharge status: Home / Self Care | Payer: Medicare Other | Source: Ambulatory Visit | Attending: Internal Medicine | Admitting: Internal Medicine

## 2011-01-22 DIAGNOSIS — R141 Gas pain: Secondary | ICD-10-CM

## 2011-01-22 DIAGNOSIS — R140 Abdominal distension (gaseous): Secondary | ICD-10-CM

## 2011-01-22 DIAGNOSIS — I129 Hypertensive chronic kidney disease with stage 1 through stage 4 chronic kidney disease, or unspecified chronic kidney disease: Secondary | ICD-10-CM | POA: Diagnosis present

## 2011-01-22 DIAGNOSIS — Z6829 Body mass index (BMI) 29.0-29.9, adult: Secondary | ICD-10-CM

## 2011-01-22 DIAGNOSIS — K219 Gastro-esophageal reflux disease without esophagitis: Secondary | ICD-10-CM | POA: Diagnosis present

## 2011-01-22 DIAGNOSIS — I2510 Atherosclerotic heart disease of native coronary artery without angina pectoris: Secondary | ICD-10-CM | POA: Diagnosis present

## 2011-01-22 DIAGNOSIS — N400 Enlarged prostate without lower urinary tract symptoms: Secondary | ICD-10-CM | POA: Diagnosis present

## 2011-01-22 DIAGNOSIS — I480 Paroxysmal atrial fibrillation: Secondary | ICD-10-CM | POA: Diagnosis not present

## 2011-01-22 DIAGNOSIS — R195 Other fecal abnormalities: Secondary | ICD-10-CM

## 2011-01-22 DIAGNOSIS — F29 Unspecified psychosis not due to a substance or known physiological condition: Secondary | ICD-10-CM | POA: Diagnosis not present

## 2011-01-22 DIAGNOSIS — R5381 Other malaise: Secondary | ICD-10-CM | POA: Diagnosis not present

## 2011-01-22 DIAGNOSIS — I4729 Other ventricular tachycardia: Secondary | ICD-10-CM | POA: Diagnosis not present

## 2011-01-22 DIAGNOSIS — I10 Essential (primary) hypertension: Secondary | ICD-10-CM | POA: Diagnosis present

## 2011-01-22 DIAGNOSIS — K560 Paralytic ileus: Secondary | ICD-10-CM | POA: Diagnosis present

## 2011-01-22 DIAGNOSIS — I472 Ventricular tachycardia, unspecified: Secondary | ICD-10-CM | POA: Diagnosis present

## 2011-01-22 DIAGNOSIS — Z9861 Coronary angioplasty status: Secondary | ICD-10-CM

## 2011-01-22 DIAGNOSIS — R142 Eructation: Secondary | ICD-10-CM

## 2011-01-22 DIAGNOSIS — I252 Old myocardial infarction: Secondary | ICD-10-CM

## 2011-01-22 DIAGNOSIS — A419 Sepsis, unspecified organism: Secondary | ICD-10-CM | POA: Diagnosis present

## 2011-01-22 DIAGNOSIS — M549 Dorsalgia, unspecified: Secondary | ICD-10-CM | POA: Diagnosis present

## 2011-01-22 DIAGNOSIS — E876 Hypokalemia: Secondary | ICD-10-CM | POA: Diagnosis not present

## 2011-01-22 DIAGNOSIS — J9819 Other pulmonary collapse: Secondary | ICD-10-CM | POA: Diagnosis present

## 2011-01-22 DIAGNOSIS — J189 Pneumonia, unspecified organism: Secondary | ICD-10-CM | POA: Diagnosis present

## 2011-01-22 DIAGNOSIS — K810 Acute cholecystitis: Principal | ICD-10-CM | POA: Diagnosis present

## 2011-01-22 DIAGNOSIS — A4159 Other Gram-negative sepsis: Secondary | ICD-10-CM | POA: Diagnosis present

## 2011-01-22 DIAGNOSIS — Z87442 Personal history of urinary calculi: Secondary | ICD-10-CM

## 2011-01-22 DIAGNOSIS — N179 Acute kidney failure, unspecified: Secondary | ICD-10-CM | POA: Diagnosis not present

## 2011-01-22 DIAGNOSIS — K5730 Diverticulosis of large intestine without perforation or abscess without bleeding: Secondary | ICD-10-CM

## 2011-01-22 DIAGNOSIS — E871 Hypo-osmolality and hyponatremia: Secondary | ICD-10-CM | POA: Diagnosis not present

## 2011-01-22 DIAGNOSIS — K812 Acute cholecystitis with chronic cholecystitis: Secondary | ICD-10-CM | POA: Diagnosis present

## 2011-01-22 DIAGNOSIS — K625 Hemorrhage of anus and rectum: Secondary | ICD-10-CM

## 2011-01-22 DIAGNOSIS — R32 Unspecified urinary incontinence: Secondary | ICD-10-CM | POA: Diagnosis not present

## 2011-01-22 DIAGNOSIS — N189 Chronic kidney disease, unspecified: Secondary | ICD-10-CM | POA: Diagnosis present

## 2011-01-22 DIAGNOSIS — R109 Unspecified abdominal pain: Secondary | ICD-10-CM

## 2011-01-22 DIAGNOSIS — E785 Hyperlipidemia, unspecified: Secondary | ICD-10-CM | POA: Diagnosis present

## 2011-01-22 DIAGNOSIS — K56609 Unspecified intestinal obstruction, unspecified as to partial versus complete obstruction: Secondary | ICD-10-CM | POA: Diagnosis present

## 2011-01-22 DIAGNOSIS — Z860100 Personal history of colon polyps, unspecified: Secondary | ICD-10-CM

## 2011-01-22 DIAGNOSIS — I4891 Unspecified atrial fibrillation: Secondary | ICD-10-CM | POA: Diagnosis not present

## 2011-01-22 DIAGNOSIS — Z882 Allergy status to sulfonamides status: Secondary | ICD-10-CM

## 2011-01-22 DIAGNOSIS — Z79899 Other long term (current) drug therapy: Secondary | ICD-10-CM

## 2011-01-22 DIAGNOSIS — R197 Diarrhea, unspecified: Secondary | ICD-10-CM | POA: Diagnosis present

## 2011-01-22 DIAGNOSIS — I4892 Unspecified atrial flutter: Secondary | ICD-10-CM

## 2011-01-22 DIAGNOSIS — F411 Generalized anxiety disorder: Secondary | ICD-10-CM | POA: Diagnosis present

## 2011-01-22 DIAGNOSIS — Z7982 Long term (current) use of aspirin: Secondary | ICD-10-CM

## 2011-01-22 DIAGNOSIS — Z888 Allergy status to other drugs, medicaments and biological substances status: Secondary | ICD-10-CM

## 2011-01-22 DIAGNOSIS — G8929 Other chronic pain: Secondary | ICD-10-CM | POA: Diagnosis present

## 2011-01-22 DIAGNOSIS — M129 Arthropathy, unspecified: Secondary | ICD-10-CM | POA: Diagnosis present

## 2011-01-22 DIAGNOSIS — K5731 Diverticulosis of large intestine without perforation or abscess with bleeding: Secondary | ICD-10-CM

## 2011-01-22 DIAGNOSIS — R143 Flatulence: Secondary | ICD-10-CM

## 2011-01-22 DIAGNOSIS — D72829 Elevated white blood cell count, unspecified: Secondary | ICD-10-CM | POA: Diagnosis not present

## 2011-01-22 DIAGNOSIS — J9600 Acute respiratory failure, unspecified whether with hypoxia or hypercapnia: Secondary | ICD-10-CM | POA: Diagnosis not present

## 2011-01-22 HISTORY — DX: Gastro-esophageal reflux disease without esophagitis: K21.9

## 2011-01-22 HISTORY — DX: Encounter for other specified aftercare: Z51.89

## 2011-01-22 HISTORY — DX: Reserved for inherently not codable concepts without codable children: IMO0001

## 2011-01-22 HISTORY — DX: Other chronic pain: G89.29

## 2011-01-22 HISTORY — DX: Anemia, unspecified: D64.9

## 2011-01-22 HISTORY — DX: Dorsalgia, unspecified: M54.9

## 2011-01-22 HISTORY — DX: Anxiety disorder, unspecified: F41.9

## 2011-01-22 HISTORY — DX: Gastrointestinal hemorrhage, unspecified: K92.2

## 2011-01-22 LAB — CBC
HCT: 45 % (ref 39.0–52.0)
HCT: 44.6 % (ref 39.0–52.0)
Hemoglobin: 15.4 g/dL (ref 13.0–17.0)
Hemoglobin: 15.3 g/dL (ref 13.0–17.0)
MCH: 30.7 pg (ref 26.0–34.0)
MCHC: 34.3 g/dL (ref 30.0–36.0)
MCV: 90 fL (ref 78.0–100.0)
RBC: 4.98 MIL/uL (ref 4.22–5.81)
RDW: 13.2 % (ref 11.5–15.5)
WBC: 8.8 10*3/uL (ref 4.0–10.5)

## 2011-01-22 LAB — COMPREHENSIVE METABOLIC PANEL
ALT: 18 U/L (ref 0–53)
BUN: 38 mg/dL — ABNORMAL HIGH (ref 6–23)
CO2: 20 mEq/L (ref 19–32)
Calcium: 8.5 mg/dL (ref 8.4–10.5)
Creatinine, Ser: 1.17 mg/dL (ref 0.50–1.35)
GFR calc Af Amer: 68 mL/min — ABNORMAL LOW (ref >90)
GFR calc non Af Amer: 59 mL/min — ABNORMAL LOW (ref >90)
Glucose, Bld: 124 mg/dL — ABNORMAL HIGH (ref 70–99)
Sodium: 137 mEq/L (ref 135–145)

## 2011-01-22 LAB — DIFFERENTIAL
Basophils Absolute: 0 10*3/uL (ref 0.0–0.1)
Eosinophils Relative: 1 % (ref 0–5)
Lymphocytes Relative: 9 % — ABNORMAL LOW (ref 12–46)
Monocytes Absolute: 0.7 10*3/uL (ref 0.1–1.0)
Monocytes Relative: 7 % (ref 3–12)

## 2011-01-22 LAB — CREATININE, SERUM: GFR calc non Af Amer: 55 mL/min — ABNORMAL LOW (ref >90)

## 2011-01-22 MED ORDER — SIMVASTATIN 40 MG PO TABS
40.0000 mg | ORAL_TABLET | Freq: Every day | ORAL | Status: DC
  Administered 2011-01-22 – 2011-01-23 (×2): 40 mg via ORAL
  Filled 2011-01-22 (×3): qty 1

## 2011-01-22 MED ORDER — ALUM & MAG HYDROXIDE-SIMETH 200-200-20 MG/5ML PO SUSP: 30.0000 mL | Freq: Four times a day (QID) | ORAL | Status: DC | PRN

## 2011-01-22 MED ORDER — CITALOPRAM HYDROBROMIDE 20 MG PO TABS
20.0000 mg | ORAL_TABLET | Freq: Every day | ORAL | Status: DC
  Administered 2011-01-22 – 2011-01-24 (×3): 20 mg via ORAL
  Filled 2011-01-22 (×3): qty 1

## 2011-01-22 MED ORDER — HYDROMORPHONE HCL PF 1 MG/ML IJ SOLN
1.0000 mg | Freq: Once | INTRAMUSCULAR | Status: AC
  Administered 2011-01-22: 1 mg via INTRAVENOUS
  Filled 2011-01-22: qty 1

## 2011-01-22 MED ORDER — SODIUM CHLORIDE 0.9 % IV SOLN
Freq: Once | INTRAVENOUS | Status: AC
  Administered 2011-01-22: 10:00:00 via INTRAVENOUS

## 2011-01-22 MED ORDER — PROMETHAZINE HCL 12.5 MG PO TABS
12.5000 mg | ORAL_TABLET | Freq: Four times a day (QID) | ORAL | Status: DC | PRN
  Filled 2011-01-22: qty 1

## 2011-01-22 MED ORDER — PANTOPRAZOLE SODIUM 40 MG IV SOLR
40.0000 mg | INTRAVENOUS | Status: DC
  Administered 2011-01-22 – 2011-01-23 (×2): 40 mg via INTRAVENOUS
  Filled 2011-01-22 (×3): qty 40

## 2011-01-22 MED ORDER — MORPHINE SULFATE 4 MG/ML IJ SOLN
INTRAMUSCULAR | Status: AC
  Administered 2011-01-22: 4 mg via INTRAVENOUS
  Filled 2011-01-22: qty 1

## 2011-01-22 MED ORDER — FLUTICASONE PROPIONATE 50 MCG/ACT NA SUSP
2.0000 | Freq: Every day | NASAL | Status: DC
  Administered 2011-01-22 – 2011-02-04 (×14): 2 via NASAL
  Filled 2011-01-22 (×2): qty 16

## 2011-01-22 MED ORDER — ACETAMINOPHEN 325 MG PO TABS
650.0000 mg | ORAL_TABLET | ORAL | Status: DC | PRN
  Administered 2011-01-22 – 2011-01-24 (×4): 650 mg via ORAL
  Filled 2011-01-22 (×4): qty 2

## 2011-01-22 MED ORDER — GABAPENTIN 400 MG PO CAPS
400.0000 mg | ORAL_CAPSULE | Freq: Three times a day (TID) | ORAL | Status: DC
  Administered 2011-01-22 – 2011-01-24 (×6): 400 mg via ORAL
  Filled 2011-01-22 (×7): qty 1

## 2011-01-22 MED ORDER — ASPIRIN EC 81 MG PO TBEC
81.0000 mg | DELAYED_RELEASE_TABLET | Freq: Every day | ORAL | Status: DC
  Administered 2011-01-22 – 2011-01-24 (×3): 81 mg via ORAL
  Filled 2011-01-22 (×3): qty 1

## 2011-01-22 MED ORDER — MORPHINE SULFATE 4 MG/ML IJ SOLN
4.0000 mg | Freq: Once | INTRAMUSCULAR | Status: AC
  Administered 2011-01-22: 4 mg via INTRAVENOUS
  Filled 2011-01-22: qty 1

## 2011-01-22 MED ORDER — MORPHINE SULFATE 2 MG/ML IJ SOLN
2.0000 mg | INTRAMUSCULAR | Status: DC | PRN
  Administered 2011-01-22 – 2011-01-24 (×5): 2 mg via INTRAVENOUS
  Filled 2011-01-22 (×5): qty 1

## 2011-01-22 MED ORDER — NEBIVOLOL HCL 5 MG PO TABS
5.0000 mg | ORAL_TABLET | Freq: Every day | ORAL | Status: DC
  Administered 2011-01-22 – 2011-01-23 (×2): 5 mg via ORAL
  Filled 2011-01-22 (×3): qty 1

## 2011-01-22 MED ORDER — ZOLPIDEM TARTRATE 5 MG PO TABS
5.0000 mg | ORAL_TABLET | Freq: Every evening | ORAL | Status: DC | PRN
  Administered 2011-01-22: 5 mg via ORAL
  Filled 2011-01-22: qty 1

## 2011-01-22 MED ORDER — ALBUTEROL SULFATE (5 MG/ML) 0.5% IN NEBU
2.5000 mg | INHALATION_SOLUTION | RESPIRATORY_TRACT | Status: DC | PRN
  Administered 2011-01-24 – 2011-01-26 (×2): 2.5 mg via RESPIRATORY_TRACT
  Filled 2011-01-22 (×2): qty 0.5

## 2011-01-22 MED ORDER — ONDANSETRON HCL 4 MG/2ML IJ SOLN
4.0000 mg | Freq: Once | INTRAMUSCULAR | Status: AC
  Administered 2011-01-22: 4 mg via INTRAVENOUS
  Filled 2011-01-22: qty 2

## 2011-01-22 MED ORDER — HYDROCODONE-ACETAMINOPHEN 5-325 MG PO TABS
1.0000 | ORAL_TABLET | ORAL | Status: DC | PRN
  Administered 2011-01-23 (×3): 2 via ORAL
  Administered 2011-01-23: 1 via ORAL
  Filled 2011-01-22 (×3): qty 2
  Filled 2011-01-22: qty 1

## 2011-01-22 MED ORDER — LISINOPRIL 10 MG PO TABS
10.0000 mg | ORAL_TABLET | Freq: Every day | ORAL | Status: DC
  Administered 2011-01-22 – 2011-01-23 (×2): 10 mg via ORAL
  Filled 2011-01-22 (×3): qty 1

## 2011-01-22 MED ORDER — SODIUM CHLORIDE 0.9 % IV SOLN
INTRAVENOUS | Status: DC
  Administered 2011-01-22 – 2011-01-25 (×5): via INTRAVENOUS

## 2011-01-22 MED ORDER — ENOXAPARIN SODIUM 40 MG/0.4ML ~~LOC~~ SOLN
40.0000 mg | SUBCUTANEOUS | Status: DC
  Administered 2011-01-22 – 2011-02-03 (×13): 40 mg via SUBCUTANEOUS
  Filled 2011-01-22 (×16): qty 0.4

## 2011-01-22 MED ORDER — ASPIRIN 81 MG PO TABS: 81.0000 mg | ORAL_TABLET | Freq: Every day | ORAL | Status: DC

## 2011-01-22 MED ORDER — PROMETHAZINE HCL 25 MG/ML IJ SOLN
12.5000 mg | Freq: Four times a day (QID) | INTRAMUSCULAR | Status: DC | PRN
  Filled 2011-01-22: qty 1

## 2011-01-22 NOTE — ED Provider Notes (Addendum)
History     CSN: [id number] Arrival date & time: 01/22/2011  8:22 AM   First MD Initiated Contact with Patient 01/22/11 [phone]      Chief Complaint  Patient presents with  . Abdominal Pain    (Consider location/radiation/quality/duration/timing/severity/associated sxs/prior treatment) Patient is a 77 y.o. male presenting with abdominal pain. The history is provided by the patient (The patient states that his pain in his abdomen became very severe today. He was seen 3 days ago in the hospital and had a CT scan for nausea vomiting diarrhea and was discharged. Patient states that he still has the nausea and vomiting and loose stools bu).  Abdominal Pain The primary symptoms of the illness include abdominal pain, nausea, vomiting and diarrhea. The primary symptoms of the illness do not include fatigue, shortness of breath or dysuria. The current episode started more than 2 days ago. The onset of the illness was gradual.  The abdominal pain began more than 2 days ago. The abdominal pain has been unchanged since its onset. Pain Location: epigastric. The abdominal pain does not radiate. The severity of the abdominal pain is 7/10. The abdominal pain is relieved by vomiting. The abdominal pain is exacerbated by vomiting.  Associated with: nothing. Symptoms associated with the illness do not include chills, anorexia, constipation, hematuria, frequency or back pain. Significant associated medical issues do not include PUD, GERD, inflammatory bowel disease or diabetes.    Past Medical History  Diagnosis Date  . Heart attack 2011  . Previous back surgery 1997  . Hypertension   . Arthritis   . Hemorrhoids   . Chronic kidney disease   . Kidney stones   . Dyslipidemia   . Diverticulosis   . Colon polyps     Past Surgical History  Procedure Date  . Eye surgery 1942  . Back surgery 1997  . Colon surgery     COLONOSCOPY  . Subtotal colectomy     2011    Family History  Problem Relation Age  of Onset  . Heart disease Father   . Colon cancer Neg Hx   . Pancreatic cancer Mother     History  Substance Use Topics  . Smoking status: Never Smoker   . Smokeless tobacco: Not on file  . Alcohol Use: No      Review of Systems  Constitutional: Negative for chills and fatigue.  HENT: Negative for congestion, sinus pressure and ear discharge.   Eyes: Negative for discharge.  Respiratory: Negative for cough and shortness of breath.   Cardiovascular: Negative for chest pain.  Gastrointestinal: Positive for nausea, vomiting, abdominal pain and diarrhea. Negative for constipation and anorexia.  Genitourinary: Negative for dysuria, frequency and hematuria.  Musculoskeletal: Negative for back pain.  Skin: Negative for rash.  Neurological: Negative for seizures and headaches.  Hematological: Negative.   Psychiatric/Behavioral: Negative for hallucinations.    Allergies  Clopidogrel bisulfate; Sulfonamide derivatives; and Naproxen  Home Medications   Current Outpatient Rx  Name Route Sig Dispense Refill  . ACETAMINOPHEN 500 MG PO TABS Oral Take 500 mg by mouth every 6 (six) hours as needed.      . ASPIRIN 81 MG PO TABS Oral Take 81 mg by mouth daily.      Marland Kitchen CITALOPRAM HYDROBROMIDE 20 MG PO TABS Oral Take 20 mg by mouth daily.      Marland Kitchen DOXAZOSIN MESYLATE PO Oral Take 8 mg by mouth daily.      Marland Kitchen FEXOFENADINE HCL 180 MG PO TABS  Oral Take 180 mg by mouth daily.      Marland Kitchen FLUTICASONE PROPIONATE 50 MCG/ACT NA SUSP Nasal 2 sprays by Nasal route daily.      Marland Kitchen GABAPENTIN 400 MG PO CAPS Oral Take 400 mg by mouth 3 (three) times daily.      Marland Kitchen HYDROCODONE-ACETAMINOPHEN 5-325 MG PO TABS Oral Take 1 tablet by mouth every 6 (six) hours as needed. For pain    . LISINOPRIL 10 MG PO TABS Oral Take 10 mg by mouth daily.      . NEBIVOLOL HCL 5 MG PO TABS Oral Take 5 mg by mouth daily.      Marland Kitchen OMEPRAZOLE 20 MG PO CPDR Oral Take 1 capsule (20 mg total) by mouth daily. 30 capsule 1    Take 1 capsule 30 min  before breakfast. Lot# [id number] ...  . ONDANSETRON 4 MG PO TBDP Oral Take 1 tablet (4 mg total) by mouth every 8 (eight) hours as needed for nausea. 20 tablet 0  . SIMVASTATIN 40 MG PO TABS Oral Take 40 mg by mouth at bedtime.      . TESTOSTERONE 50 MG/5GM TD GEL Transdermal Place 5 g onto the skin daily.      . TRAMADOL-ACETAMINOPHEN 37.5-325 MG PO TABS Oral Take 1 tablet by mouth every 6 (six) hours as needed.      Marland Kitchen NITROGLYCERIN 0.4 MG SL SUBL Sublingual Place 0.4 mg under the tongue every 5 (five) minutes as needed.        BP 127/52  Pulse 64  Temp(Src) 98.4 F (36.9 C) (Oral)  Resp 14  SpO2 97%  Physical Exam  Constitutional: He is oriented to person, place, and time. He appears well-developed.  HENT:  Head: Normocephalic and atraumatic.  Eyes: Conjunctivae and EOM are normal. No scleral icterus.  Neck: Neck supple. No thyromegaly present.  Cardiovascular: Normal rate and regular rhythm.  Exam reveals no gallop and no friction rub.   No murmur heard. Pulmonary/Chest: No stridor. He has no wheezes. He has no rales. He exhibits no tenderness.  Abdominal: He exhibits no distension. There is tenderness. There is no rebound.       Tender midepigastric  Musculoskeletal: Normal range of motion. He exhibits no edema.  Lymphadenopathy:    He has no cervical adenopathy.  Neurological: He is oriented to person, place, and time. Coordination normal.  Skin: No rash noted. No erythema.  Psychiatric: He has a normal mood and affect. His behavior is normal.    ED Course  Procedures (including critical care time)  Labs Reviewed  COMPREHENSIVE METABOLIC PANEL - Abnormal; Notable for the following:    Glucose, Bld 124 (*)    BUN 38 (*)    Alkaline Phosphatase 33 (*)    GFR calc non Af Amer 59 (*)    GFR calc Af Amer 68 (*)    All other components within normal limits  DIFFERENTIAL - Abnormal; Notable for the following:    Neutrophils Relative 83 (*)    Lymphocytes Relative 9 (*)     All other components within normal limits  LIPASE, BLOOD  CBC   Dg Abd Acute W/chest  01/22/2011  *RADIOLOGY REPORT*  Clinical Data: Epigastric pain and distention.  Nausea vomiting and diarrhea.  Weakness.  Fever.  Chills.  ACUTE ABDOMEN SERIES (ABDOMEN 2 VIEW & CHEST 1 VIEW)  Comparison: 01/20/2011 CT abdomen.  Most recent plain film chest of 06/27/2010.  Findings: Frontal view of the chest demonstrates midline trachea. Mild  cardiomegaly, accentuated by low lung volumes.  Moderate right hemidiaphragm elevation is not significantly changed. No pleural effusion or pneumothorax.  A left upper lobe calcified granuloma. Mild vascular crowding and infrahilar atelectasis on the right.  Abominal films demonstrate no free intraperitoneal air on upright positioning.  Air fluid levels within the mid small bowel loops. Mottled lucency about the right abdomen is favored to be gas within nearly completely fluid filled bowel loops.  Small bowel dilatation at 4.0 cm in the right upper abdomen. Paucity of distal gas.  Lumbosacral spine fixation.  IMPRESSION: Findings suspicious for small bowel obstruction. Consider CT.  Original Report Authenticated By: Areta Haber, M.D.     1. Abdominal pain       MDM  abd pain possible sbo     Surgery saw pt and stated pt can be admitted by hospitalist   Maudry Diego, MD 01/22/11 [place], MD 01/22/11 1346

## 2011-01-22 NOTE — ED Notes (Signed)
Assumed care of pt.  No distress noted.  Pt reports having pain and requesting his pain medication.  Pt updated that pain medication is every 4 hours, given at 3pm.  Pt calm, cooperative.  Skin warm, dry and intact.  Neuro intact.

## 2011-01-22 NOTE — H&P (Signed)
PATIENT DETAILS Name: Gary [name] Age: 77 y.o. Sex: male Date of Birth: 1932/11/30 Admit Date: 01/22/2011 PW:[id number], Gary Bachelor, MD, MD   CHIEF COMPLAINT:  Abdominal pain since last Friday   HPI: Patient is a 77 year old white male with a past medical history of coronary artery disease status post PTCA, hypertension, dyslipidemia, prior history of a diverticular bleed requiring a colectomy comes in with abdominal pain since last Friday. The patient is mostly in the upper abdomen. There is no radiation of the pain. Pain has been associated with nausea but no vomiting. Patient has had numerous episodes of diarrhea as well. Patient presented to the emergency room a few days ago and was rehydrated, a CT scan of his abdomen was done and was sent home after supportive care. After being discharged from the emergency room patient continued to have worsening abdominal pain and diarrhea, hence she presented to [facility] for further evaluation. A abdominal x-ray done today shows suspicion of a small bowel obstruction. Surgery has evaluated the patient, given the patient's multiple medical issues the hospitalist service has not been asked to admit this patient for further evaluation and treatment. Dressing the patient has had no further episodes of diarrhea or nausea since 4 AM this morning. During my evaluation, his abdominal pain had significantly improved. He denied any chest pain or shortness of breath. He denied any blood in the stools.   ALLERGIES:   Allergies  Allergen Reactions  . Clopidogrel Bisulfate Other (See Comments)    H/O diverticulitis; prone to rectal bleeding.  Plavix complicated this.  jkl  . Sulfonamide Derivatives Hives  . Naproxen Rash    PAST MEDICAL HISTORY: Past Medical History  Diagnosis Date  . Previous back surgery 1997  . Hypertension   . Hemorrhoids   . Chronic kidney disease   . Kidney stones   . Dyslipidemia   . Diverticulosis   . Colon polyps   .  Angina   . Heart attack 07/2009  . Blood transfusion   . Anemia   . GI bleeding after 07/2009    "triggered by Plavix & ASA; from my diverticulosis"  . GERD (gastroesophageal reflux disease)   . Headache     occasionally  . Anxiety   . Arthritis     "hips and lower back"  . Chronic back pain greater than 3 months duration     PAST SURGICAL HISTORY: Past Surgical History  Procedure Date  . Eye surgery 1942  . Back surgery 1997  . Colon surgery     COLONOSCOPY  . Subtotal colectomy 01/24/2010    MEDICATIONS AT HOME: Prior to Admission medications   Medication Sig Start Date End Date Taking? Authorizing Provider  acetaminophen (TYLENOL) 500 MG tablet Take 500 mg by mouth every 6 (six) hours as needed.     Yes Historical Provider, MD  aspirin 81 MG tablet Take 81 mg by mouth daily.     Yes Historical Provider, MD  citalopram (CELEXA) 20 MG tablet Take 20 mg by mouth daily.     Yes Historical Provider, MD  DOXAZOSIN MESYLATE PO Take 8 mg by mouth daily.     Yes Historical Provider, MD  fexofenadine (ALLEGRA) 180 MG tablet Take 180 mg by mouth daily.     Yes Historical Provider, MD  fluticasone (FLONASE) 50 MCG/ACT nasal spray 2 sprays by Nasal route daily.     Yes Historical Provider, MD  gabapentin (NEURONTIN) 400 MG capsule Take 400 mg by mouth 3 (  three) times daily.     Yes Historical Provider, MD  HYDROcodone-acetaminophen (NORCO) 5-325 MG per tablet Take 1 tablet by mouth every 6 (six) hours as needed. For pain   Yes Historical Provider, MD  lisinopril (PRINIVIL,ZESTRIL) 10 MG tablet Take 10 mg by mouth daily.     Yes Historical Provider, MD  nebivolol (BYSTOLIC) 5 MG tablet Take 5 mg by mouth daily.     Yes Historical Provider, MD  omeprazole (PRILOSEC) 20 MG capsule Take 1 capsule (20 mg total) by mouth daily. 08/07/10 08/07/11 Yes Willia Craze, NP  ondansetron (ZOFRAN ODT) 4 MG disintegrating tablet Take 1 tablet (4 mg total) by mouth every 8 (eight) hours as needed for  nausea. 01/20/11 01/27/11 Yes Ruthell Rummage Dammen, PA  simvastatin (ZOCOR) 40 MG tablet Take 40 mg by mouth at bedtime.     Yes Historical Provider, MD  testosterone (ANDROGEL) 50 MG/5GM GEL Place 5 g onto the skin daily.     Yes Historical Provider, MD  traMADol-acetaminophen (ULTRACET) 37.5-325 MG per tablet Take 1 tablet by mouth every 6 (six) hours as needed.     Yes Historical Provider, MD  nitroGLYCERIN (NITROSTAT) 0.4 MG SL tablet Place 0.4 mg under the tongue every 5 (five) minutes as needed.      Historical Provider, MD    FAMILY HISTORY: Family History  Problem Relation Age of Onset  . Heart disease Father   . Colon cancer Neg Hx   . Pancreatic cancer Mother     SOCIAL HISTORY:  reports that he has never smoked. He has never used smokeless tobacco. He reports that he drinks alcohol. He reports that he does not use illicit drugs.  REVIEW OF SYSTEMS:  Constitutional:   No  weight loss, night sweats,  Fevers, chills, fatigue.  HEENT:    No headaches, Difficulty swallowing,Tooth/dental problems,Sore throat,  No sneezing, itching, ear ache, nasal congestion, post nasal drip,   Cardio-vascular: No chest pain,  Orthopnea, PND, swelling in lower extremities, anasarca, dizziness, palpitations  GI:  No heartburn, indigestion,, vomiting, , loss of appetite  Resp: No shortness of breath with exertion or at rest.  No excess mucus, no productive cough, No non-productive cough,  No coughing up of blood.No change in color of mucus.No wheezing.No chest wall deformity  Skin:  no rash or lesions.  GU:  no dysuria, change in color of urine, no urgency or frequency.  No flank pain.  Musculoskeletal: No joint pain or swelling.  No decreased range of motion.  No back pain.  Psych: No change in mood or affect. No depression or anxiety.  No memory loss.   PHYSICAL EXAM: Blood pressure 135/65, pulse 63, temperature 97.7 F (36.5 C), temperature source Oral, resp. rate 18, SpO2  100.00%.  General appearance :Awake, alert, not in any distress. Speech Clear. Not toxic Looking HEENT: Atraumatic and Normocephalic, pupils equally reactive to light and accomodation Neck: supple, no JVD. No cervical lymphadenopathy.  Chest:Good air entry bilaterally, no added sounds  CVS: S1 S2 regular, no murmurs.  Abdomen: Bowel sounds present, minimally tender in the upper abdominal area,  and mildly distended with no gaurding, rigidity or rebound. Extremities: B/L Lower Ext shows no edema, both legs are warm to touch, with  dorsalis pedis pulses palpable. Neurology: Awake alert, and oriented X 3, CN II-XII intact, Non focal, Deep Tendon Reflex-2+ all over, plantar's downgoing B/L, sensory exam is grossly intact.  Skin:No Rash Wounds:N/A  LABS ON ADMISSION:   Basename 01/22/11  0849 01/20/11 0240  NA 137 138  K 4.0 3.7  CL 105 98  CO2 20 29  GLUCOSE 124* 143*  BUN 38* 35*  CREATININE 1.17 1.48*  CALCIUM 8.5 9.1  MG -- --  PHOS -- --    Basename 01/22/11 0849 01/20/11 0240  AST 31 30  ALT 18 19  ALKPHOS 33* 31*  BILITOT 0.5 0.5  PROT 6.4 6.9  ALBUMIN 3.6 3.9    Basename 01/22/11 0849  LIPASE 25  AMYLASE --    Basename 01/22/11 1033 01/20/11 0240  WBC 8.8 9.3  NEUTROABS 7.3 7.9*  HGB 15.4 15.5  HCT 45.0 45.9  MCV 90.0 90.2  PLT 159 163   No results found for this basename: CKTOTAL:3,CKMB:3,CKMBINDEX:3,TROPONINI:3 in the last 72 hours No results found for this basename: DDIMER:2 in the last 72 hours No components found with this basename: POCBNP:3   RADIOLOGIC STUDIES ON ADMISSION: Ct Abdomen Pelvis W Contrast  01/20/2011  *RADIOLOGY REPORT*  Clinical Data: Abdominal pain, nausea, vomiting, diarrhea.  History of colectomy.  CT ABDOMEN AND PELVIS WITH CONTRAST  Technique:  Multidetector CT imaging of the abdomen and pelvis was performed following the standard protocol during bolus administration of intravenous contrast.  Contrast: 125mL OMNIPAQUE IOHEXOL 300  MG/ML IV SOLN  Comparison: 01/19/2010  Findings: Infiltration or atelectasis in the right lung base with dependent atelectasis in the left lung base.  Calcified granulomas in the spleen.  The liver, gallbladder, pancreas, adrenal glands, kidneys, and retroperitoneal lymph nodes are unremarkable. Vascular calcifications in the aorta without aneurysm.  Mild thickening of the wall of the distal esophagus possibly due to under distension or inflammatory esophagitis.  No free fluid or free air in the abdomen.  Prominent visceral adipose tissue.  Postoperative changes consistent with some total colectomy with ileal sigmoid anastomoses.  The  distal small bowel are mildly distended and filled with fluid and gas.  There is no wall thickening.  The anastomosis appears patent.  Changes appear to extend to the rectum, suggesting an ileus rather than obstruction. No obstructing lesion is visualized.  Pelvis:  Calcifications in the prostate gland.  No bladder wall thickening.  No free or loculated pelvic fluid collections.  No pelvic lymphadenopathy.  Postoperative changes and degenerative changes in the lumbar spine.  IMPRESSION: Postoperative changes with some total colectomy and ileal sigmoid anastomoses.  Mild distension with fluid filled small bowel loops extending to the anus.  Although anal obstruction is not excluded, changes more likely represent ileus.  No significant wall thickening.  Original Report Authenticated By: Neale Burly, M.D.   Dg Abd Acute W/chest  01/22/2011  *RADIOLOGY REPORT*  Clinical Data: Epigastric pain and distention.  Nausea vomiting and diarrhea.  Weakness.  Fever.  Chills.  ACUTE ABDOMEN SERIES (ABDOMEN 2 VIEW & CHEST 1 VIEW)  Comparison: 01/20/2011 CT abdomen.  Most recent plain film chest of 06/27/2010.  Findings: Frontal view of the chest demonstrates midline trachea. Mild cardiomegaly, accentuated by low lung volumes.  Moderate right hemidiaphragm elevation is not significantly  changed. No pleural effusion or pneumothorax.  A left upper lobe calcified granuloma. Mild vascular crowding and infrahilar atelectasis on the right.  Abominal films demonstrate no free intraperitoneal air on upright positioning.  Air fluid levels within the mid small bowel loops. Mottled lucency about the right abdomen is favored to be gas within nearly completely fluid filled bowel loops.  Small bowel dilatation at 4.0 cm in the right upper abdomen. Paucity of distal gas.  Lumbosacral spine fixation.  IMPRESSION: Findings suspicious for small bowel obstruction. Consider CT.  Original Report Authenticated By: Areta Haber, M.D.    ASSESSMENT AND PLAN: Present on Admission:  .Ileus vs gastroenteritis: -We'll admit the patient to a regular medical surgical unit. -We'll keep the patient n.p.o. and will be gently hydrated. -He is symptomatically better, we will not insert a NG tube for now. However if he were to have vomiting, NG tube was then reinserted. -We will reevaluate him in the next 24 hours to see if there is improvement with supportive care and bowel rest.  .CAD -Since there is no evidence of any hematochezia, we will continue him on aspirin given his history of coronary artery disease.   Marland KitchenHYPERTENSION -We'll continue him on his regular antihypertensive medications.   .Dyslipidemia -His statin will also be continued.  Further plan will depend as patient's clinical course evolves and further radiologic and laboratory data become available. Patient will be monitored closely.  DVT Prophylaxis: With subcutaneous Lovenox at prophylactic dosing.  Code Status: Full code  Total time spent for admission equals 45 minutes.  Oren Binet 01/22/2011, 5:31 PM

## 2011-01-22 NOTE — Consult Note (Signed)
Reason for Consult: Possible SBO Referring Physician: Dr. Eddie Dibbles  ER physician  Gary [name] is an 77 y.o. male.  HPI: Gary [name] is a 77 year old gentleman who developed nausea vomiting and diarrhea started on 01/18/2011. He had persistent nausea, vomiting, and diarrhea through 01/20/2011. He was seen at the ER [facility] 01/20/2011. Bedtime was dehydrated with mild renal insufficiency a creatinine of 1.48 BUN of 35. CT scan was consistent with total colectomy and ileosigmoid anastomosis and a probable ileus. He was hydrated and sent home. He reports ongoing nausea vomiting and diarrhea up through 4 AM this morning. He's also developed abdominal pain and some mild distention. Because his ongoing symptoms and abdominal discomfort he presented to the ER at [facility] today. Labs shows his electrolytes are normal BUN is elevated at 38 creatinine is 1.17 glucose is 124. White count is 8.8 hemoglobin is 15 hematocrit is 45, platelets 159,000. Film today shows no free air there is some fluid levels in the small bowel with some distention up to 4 cm. Patient continues to complain of abdominal discomfort, better with analgesic. He's had no nausea vomiting or diarrhea since 4 AM this morning. He denies any fever or chills at this time. We were asked to see in consultation for small bowel obstruction  Past Medical History  Diagnosis Date  . Heart attack 2011  . Previous back surgery 1997  . Hypertension   . Arthritis   . Hemorrhoids   . Chronic kidney disease   . Kidney stones   . Dyslipidemia   . Diverticulosis   . Colon polyps     Past Surgical History  Procedure Date  . Eye surgery 1942  . Back surgery 1997  . Colon surgery     COLONOSCOPY  . Subtotal colectomy     2011    Family History  Problem Relation Age of Onset  . Heart disease Father   . Colon cancer Neg Hx   . Pancreatic cancer Mother     Social History:  reports that he has never smoked. He does not have  any smokeless tobacco history on file. He reports that he does not drink alcohol or use illicit drugs.  Allergies:  Allergies  Allergen Reactions  . Clopidogrel Bisulfate Other (See Comments)    H/O diverticulitis; prone to rectal bleeding.  Plavix complicated this.  jkl  . Sulfonamide Derivatives Hives  . Naproxen Rash    Medications:  Prior to Admission:  (Not in a hospital admission) Scheduled:   . sodium chloride   Intravenous Once  .  HYDROmorphone (DILAUDID) injection  1 mg Intravenous Once  .  morphine injection  4 mg Intravenous Once  . morphine      . ondansetron  4 mg Intravenous Once   Continuous:   Results for orders placed during the hospital encounter of 01/22/11 (from the past 48 hour(s))  COMPREHENSIVE METABOLIC PANEL     Status: Abnormal   Collection Time   01/22/11  8:49 AM      Component Value Range Comment   Sodium 137  135 - 145 (mEq/L)    Potassium 4.0  3.5 - 5.1 (mEq/L)    Chloride 105  96 - 112 (mEq/L)    CO2 20  19 - 32 (mEq/L)    Glucose, Bld 124 (*) 70 - 99 (mg/dL)    BUN 38 (*) 6 - 23 (mg/dL)    Creatinine, Ser 1.17  0.50 - 1.35 (mg/dL)  Calcium 8.5  8.4 - 10.5 (mg/dL)    Total Protein 6.4  6.0 - 8.3 (g/dL)    Albumin 3.6  3.5 - 5.2 (g/dL)    AST 31  0 - 37 (U/L) HEMOLYSIS AT THIS LEVEL MAY AFFECT RESULT   ALT 18  0 - 53 (U/L)    Alkaline Phosphatase 33 (*) 39 - 117 (U/L)    Total Bilirubin 0.5  0.3 - 1.2 (mg/dL)    GFR calc non Af Amer 59 (*) >90 (mL/min)    GFR calc Af Amer 68 (*) >90 (mL/min)   LIPASE, BLOOD     Status: Normal   Collection Time   01/22/11  8:49 AM      Component Value Range Comment   Lipase 25  11 - 59 (U/L)   CBC     Status: Normal   Collection Time   01/22/11 10:33 AM      Component Value Range Comment   WBC 8.8  4.0 - 10.5 (K/uL)    RBC 5.00  4.22 - 5.81 (MIL/uL)    Hemoglobin 15.4  13.0 - 17.0 (g/dL)    HCT 45.0  39.0 - 52.0 (%)    MCV 90.0  78.0 - 100.0 (fL)    MCH 30.8  26.0 - 34.0 (pg)    MCHC 34.2   30.0 - 36.0 (g/dL)    RDW 13.2  11.5 - 15.5 (%)    Platelets 159  150 - 400 (K/uL)   DIFFERENTIAL     Status: Abnormal   Collection Time   01/22/11 10:33 AM      Component Value Range Comment   Neutrophils Relative 83 (*) 43 - 77 (%)    Neutro Abs 7.3  1.7 - 7.7 (K/uL)    Lymphocytes Relative 9 (*) 12 - 46 (%)    Lymphs Abs 0.8  0.7 - 4.0 (K/uL)    Monocytes Relative 7  3 - 12 (%)    Monocytes Absolute 0.7  0.1 - 1.0 (K/uL)    Eosinophils Relative 1  0 - 5 (%)    Eosinophils Absolute 0.0  0.0 - 0.7 (K/uL)    Basophils Relative 0  0 - 1 (%)    Basophils Absolute 0.0  0.0 - 0.1 (K/uL)     Dg Abd Acute W/chest  01/22/2011  *RADIOLOGY REPORT*  Clinical Data: Epigastric pain and distention.  Nausea vomiting and diarrhea.  Weakness.  Fever.  Chills.  ACUTE ABDOMEN SERIES (ABDOMEN 2 VIEW & CHEST 1 VIEW)  Comparison: 01/20/2011 CT abdomen.  Most recent plain film chest of 06/27/2010.  Findings: Frontal view of the chest demonstrates midline trachea. Mild cardiomegaly, accentuated by low lung volumes.  Moderate right hemidiaphragm elevation is not significantly changed. No pleural effusion or pneumothorax.  A left upper lobe calcified granuloma. Mild vascular crowding and infrahilar atelectasis on the right.  Abominal films demonstrate no free intraperitoneal air on upright positioning.  Air fluid levels within the mid small bowel loops. Mottled lucency about the right abdomen is favored to be gas within nearly completely fluid filled bowel loops.  Small bowel dilatation at 4.0 cm in the right upper abdomen. Paucity of distal gas.  Lumbosacral spine fixation.  IMPRESSION: Findings suspicious for small bowel obstruction. Consider CT.  Original Report Authenticated By: Areta Haber, M.D.    Review of Systems  Constitutional: Negative for fever, chills and weight loss.  HENT: Negative.   Eyes: Negative.   Respiratory: Negative.   Cardiovascular: Positive  for claudication (some hip pain with  ambulation). Negative for chest pain, palpitations and orthopnea.  Gastrointestinal: Positive for heartburn (occasional), nausea, vomiting, abdominal pain and diarrhea. Negative for constipation.  Genitourinary: Negative.   Musculoskeletal: Negative.   Skin: Negative.   Neurological: Negative.        Chronic L arm and shoulder pain from herpes.  Endo/Heme/Allergies: Negative.   Psychiatric/Behavioral: Negative.    Blood pressure 127/52, pulse 64, temperature 98.4 F (36.9 C), temperature source Oral, resp. rate 14, SpO2 97.00%. Physical Exam  Constitutional: He appears well-developed and well-nourished. No distress.  HENT:  Head: Normocephalic and atraumatic.  Eyes: Conjunctivae and EOM are normal. Pupils are equal, round, and reactive to light.  Neck: Normal range of motion. Neck supple. No JVD present. No tracheal deviation present. No thyromegaly present.  Cardiovascular: Normal rate, regular rhythm and intact distal pulses.  Exam reveals no gallop and no friction rub.   Murmur (I-II/VI SEM base) heard. Respiratory: Effort normal and breath sounds normal. No respiratory distress. He has no wheezes. He has no rales. He exhibits no tenderness.  GI: Soft. Bowel sounds are normal. He exhibits distension. He exhibits no mass. There is tenderness. There is no rebound and no guarding.  Musculoskeletal: Normal range of motion. He exhibits no edema and no tenderness.  Lymphadenopathy:    He has no cervical adenopathy.  Skin: Skin is warm and dry. He is not diaphoretic.  Psychiatric: He has a normal mood and affect. His behavior is normal. Judgment and thought content normal.    Assessment/Plan: 1. Gastroenteritis versus ileus. Ongoing diarrhea. 2. History of MI 6 2011. Dr. Aldona Bar, cardiologist. 3. 3. Recurrent lower GI bleed with pancolonic diverticulosis. Status post some total colectomy with ileoproctostomy 01/24/2010 Dr. Fanny Skates. 4. Hypertension 5. History of  dyslipidemia. 6. Postherpetic neuralgia with chronic pain on the left. Plan: Films and studies have been seen and examined by Dr. Donnie Mesa. His opinion this is not a small bowel obstruction, most likely a gastroenteritis. He recommends medical admission with hydration, pain control, and further workup as needed. [facility] physician assistant For Dr. Donnie Mesa. Gary [name],Gary [name] 01/22/2011, 12:59 PM   Abdomen - mildly distended, tender in epigastrium   If patient continues to have vomiting, would place an NG tube to intermittent low wall suction.  Repeat x-rays and labs in AM.  At this time of year, there are a large number of patients with GI viral illnesses that have films suspicious for PSBO that actually have no surgical problems.  Considering his surgical history, we will follow along with you, but this problem should resolve with bowel rest and IV hydration.     Imogene Burn. Georgette Dover, MD, [facility] Surgery  01/22/2011 1:25 PM

## 2011-01-22 NOTE — ED Notes (Signed)
Patients ekg done and given to dr. Roderic Palau

## 2011-01-22 NOTE — ED Notes (Signed)
Pt transport arrived for radiology. Pt refused at this time stating "I'm too sick". Patient transport asked to return in 10 minutes after pain medication takes effect.

## 2011-01-22 NOTE — ED Notes (Signed)
Spoke with pt's daughter Genevieve Vizzini  [phone]) on phone, updated on plan of care.  Pt requesting ice chips, given per admission orders.

## 2011-01-22 NOTE — ED Notes (Signed)
Per EMS, patient c/o n/v/d. Abd pain 4/10. Wife has been sick with similar c/o.  Pt was at [facility] ED yesterday and 4 days ago with similar c/o.

## 2011-01-23 ENCOUNTER — Inpatient Hospital Stay (HOSPITAL_COMMUNITY): Payer: Medicare Other

## 2011-01-23 LAB — URINALYSIS, ROUTINE W REFLEX MICROSCOPIC
Glucose, UA: 100 mg/dL — AB
Leukocytes, UA: NEGATIVE
Specific Gravity, Urine: 1.027 (ref 1.005–1.030)
Urobilinogen, UA: 0.2 mg/dL (ref 0.0–1.0)

## 2011-01-23 LAB — URINE MICROSCOPIC-ADD ON

## 2011-01-23 MED ORDER — POTASSIUM CHLORIDE CRYS ER 20 MEQ PO TBCR
40.0000 meq | EXTENDED_RELEASE_TABLET | Freq: Once | ORAL | Status: AC
  Administered 2011-01-23: 40 meq via ORAL

## 2011-01-23 NOTE — Progress Notes (Signed)
Subjective: Says he feels better, no nausea, vomiting, or diarrhea since yesterday early AM before admission. No SOB.  Objective: Vital signs in last 24 hours: Temp:  [97.7 F (36.5 C)-101.2 F (38.4 C)] 99.1 F (37.3 C) (12/12 0600) Pulse Rate:  [63-86] 80  (12/12 0600) Resp:  [14-18] 18  (12/12 0600) BP: (120-150)/(48-65) 125/61 mmHg (12/12 0600) SpO2:  [94 %-100 %] 94 % (12/12 0600) Last BM Date: 01/22/11  Intake/Output from previous day: 12/11 0701 - 12/12 0700 In: 988 [P.O.:120; I.V.:868] Out: 575 [Urine:575] Intake/Output this shift:    General appearance: alert, cooperative and no distress GI: Abd still distended, sl tender,but not having the ongoing pain he described to me in ER lyesterday. He has had some flatus, but not much.  Has not walked outside of room to BR. +BS.  Lab Results:   [facility] 01/22/11 1847 01/22/11 1033  WBC 11.9* 8.8  HGB 15.3 15.4  HCT 44.6 45.0  PLT 178 159    BMET  Basename 01/22/11 1847 01/22/11 0849  NA -- 137  K -- 4.0  CL -- 105  CO2 -- 20  GLUCOSE -- 124*  BUN -- 38*  CREATININE 1.24 1.17  CALCIUM -- 8.5   PT/INR No results found for this basename: LABPROT:2,INR:2 in the last 72 hours   Studies/Results: Dg Chest 2 View  01/23/2011  *RADIOLOGY REPORT*  Clinical Data: Fever and upper abdominal pain  CHEST - 2 VIEW  Comparison: 06/27/2010  Findings: Shallow inspiration.  Infiltration or atelectasis in both lung bases, greater on the right.  This is new since the previous study may represent pneumonia.  Borderline heart size and pulmonary vascularity.  Calcified granulomas in the upper lungs.  The upper abdomen demonstrates bowel interposition in front of the liver and under the right hemidiaphragm.  Suggestion of mild gaseous distension of small bowel loops with air-fluid levels.  Small bowel obstruction should be considered.  Abdominal films likely would be useful in further evaluation.  IMPRESSION: Infiltration or  atelectasis in both lung bases, greater on the right, suggesting pneumonia.  Right upper quadrant suggest possible small bowel dilatation and air-fluid levels.  Obstruction should be excluded.  Original Report Authenticated By: Neale Burly, M.D.   Dg Abd Acute W/chest  01/22/2011  *RADIOLOGY REPORT*  Clinical Data: Epigastric pain and distention.  Nausea vomiting and diarrhea.  Weakness.  Fever.  Chills.  ACUTE ABDOMEN SERIES (ABDOMEN 2 VIEW & CHEST 1 VIEW)  Comparison: 01/20/2011 CT abdomen.  Most recent plain film chest of 06/27/2010.  Findings: Frontal view of the chest demonstrates midline trachea. Mild cardiomegaly, accentuated by low lung volumes.  Moderate right hemidiaphragm elevation is not significantly changed. No pleural effusion or pneumothorax.  A left upper lobe calcified granuloma. Mild vascular crowding and infrahilar atelectasis on the right.  Abominal films demonstrate no free intraperitoneal air on upright positioning.  Air fluid levels within the mid small bowel loops. Mottled lucency about the right abdomen is favored to be gas within nearly completely fluid filled bowel loops.  Small bowel dilatation at 4.0 cm in the right upper abdomen. Paucity of distal gas.  Lumbosacral spine fixation.  IMPRESSION: Findings suspicious for small bowel obstruction. Consider CT.  Original Report Authenticated By: Areta Haber, M.D.    Anti-infectives: Anti-infectives    None     Current Facility-Administered Medications  Medication Dose Route Frequency Provider Last Rate Last Dose  . 0.9 %  sodium chloride infusion   Intravenous Once Elwyn Lade  Roderic Palau, MD 125 mL/hr at 01/22/11 [id number]    . 0.9 %  sodium chloride infusion   Intravenous Continuous Shanker Ghimire 75 mL/hr at 01/22/11 2331    . acetaminophen (TYLENOL) tablet 650 mg  650 mg Oral Q4H PRN Anastassia Doutova   650 mg at 01/22/11 2304  . albuterol (PROVENTIL) (5 MG/ML) 0.5% nebulizer solution 2.5 mg  2.5 mg Nebulization Q2H PRN  Shanker Ghimire      . alum & mag hydroxide-simeth (MAALOX/MYLANTA) 200-200-20 MG/5ML suspension 30 mL  30 mL Oral Q6H PRN Shanker Ghimire      . aspirin EC tablet 81 mg  81 mg Oral Daily Shanker Ghimire   81 mg at 01/22/11 2304  . citalopram (CELEXA) tablet 20 mg  20 mg Oral Daily Shanker Ghimire   20 mg at 01/22/11 2307  . enoxaparin (LOVENOX) injection 40 mg  40 mg Subcutaneous Q24H Shanker Ghimire   40 mg at 01/22/11 2309  . fluticasone (FLONASE) 50 MCG/ACT nasal spray 2 spray  2 spray Each Nare Daily Shanker Ghimire   2 spray at 01/22/11 2309  . gabapentin (NEURONTIN) capsule 400 mg  400 mg Oral TID Shanker Ghimire   400 mg at 01/22/11 2304  . HYDROcodone-acetaminophen (NORCO) 5-325 MG per tablet 1-2 tablet  1-2 tablet Oral Q4H PRN Shanker Ghimire   1 tablet at 01/23/11 0650  . HYDROmorphone (DILAUDID) injection 1 mg  1 mg Intravenous Once Maudry Diego, MD   1 mg at 01/22/11 1202  . HYDROmorphone (DILAUDID) injection 1 mg  1 mg Intravenous Once Maudry Diego, MD   1 mg at 01/22/11 1500  . lisinopril (PRINIVIL,ZESTRIL) tablet 10 mg  10 mg Oral Daily Shanker Ghimire   10 mg at 01/22/11 2308  . morphine 2 MG/ML injection 2 mg  2 mg Intravenous Q4H PRN Shanker Ghimire   2 mg at 01/22/11 2322  . morphine 4 MG/ML injection 4 mg  4 mg Intravenous Once Maudry Diego, MD   4 mg at 01/22/11 0939  . morphine 4 MG/ML injection        4 mg at 01/22/11 1106  . nebivolol (BYSTOLIC) tablet 5 mg  5 mg Oral Daily Shanker Ghimire   5 mg at 01/22/11 2307  . ondansetron (ZOFRAN) injection 4 mg  4 mg Intravenous Once Maudry Diego, MD   4 mg at 01/22/11 0939  . pantoprazole (PROTONIX) injection 40 mg  40 mg Intravenous Q24H Shanker Ghimire   40 mg at 01/22/11 2312  . promethazine (PHENERGAN) tablet 12.5 mg  12.5 mg Oral Q6H PRN Shanker Ghimire       Or  . promethazine (PHENERGAN) injection 12.5 mg  12.5 mg Intravenous Q6H PRN Shanker Ghimire      . simvastatin (ZOCOR) tablet 40 mg  40 mg Oral QHS Shanker  Ghimire   40 mg at 01/22/11 2305  . zolpidem (AMBIEN) tablet 5 mg  5 mg Oral QHS PRN Shanker Ghimire   5 mg at 01/22/11 2308  . DISCONTD: aspirin tablet 81 mg  81 mg Oral Daily Shanker Ghimire        Assessment/Plan Gastroenteritis/ileus, vs SBO; S/P total colectomy,ileosigmoid anastomosis 01-24-10  Increasing WBC, and fever.  @view  xray showing ongoing SBO distension, and Bilat atelectasis. Patient Active Problem List  Diagnoses  . HYPERTENSION  . CAD  . DIVERTICULOSIS OF COLON  . DIVERTICULOSIS, COLON, WITH HEMORRHAGE  . RECTAL BLEEDING  . PERSONAL HISTORY OF COLONIC POLYPS  . Abdominal bloating  .  Loose stools  . Ileus  . Dyslipidemia  Plan:  Continue to follow, if he does not improve soon we may need to repeat CT. LOS: 1 day    Lynlee Stratton 01/23/2011

## 2011-01-23 NOTE — Progress Notes (Signed)
Utilization review completed. Gary [name] 01/23/2011

## 2011-01-23 NOTE — Progress Notes (Signed)
Minimal tenderness. No nausea, vomiting, or diarrhea today. Minimal flatus.  Ileus vs. SBO Will continue to follow Continue bowel rest. Repeat films in AM  Imogene Burn. Georgette Dover, MD, [facility] Surgery  01/23/2011 2:49 PM

## 2011-01-23 NOTE — Progress Notes (Signed)
   CARE MANAGEMENT NOTE 01/23/2011  Patient:  Gary [name], Gary [name]   Account Number:  [id number]  Date Initiated:  01/23/2011  Documentation initiated by:  Tomi Bamberger  Subjective/Objective Assessment:   dx ileus s/p colectomy  admit- lives with spouse     Action/Plan:   surgery following   Anticipated DC Date:  01/27/2011   Anticipated DC Plan:  [place]  CM consult      Choice offered to / List presented to:             Status of service:  In process, will continue to follow Medicare Important Message given?   (If response is "NO", the following Medicare IM given date fields will be blank) Date Medicare IM given:   Date Additional Medicare IM given:    Discharge Disposition:    Per UR Regulation:    Comments:  01/23/11 14:23 Tomi Bamberger RN, BSN [phone] patient lives with spouse, s/p colectomy now with sbo, ileus, surgery is following.  NCM will continue to follow for dc needs.

## 2011-01-23 NOTE — Progress Notes (Signed)
DAILY PROGRESS NOTE                              [facility]  SUBJECTIVE: Denies nausea/vomiting/diarrhea. Bowel movements since yesterday still passes gas.  OBJECTIVE: BP 125/61  Pulse 80  Temp(Src) 99.1 F (37.3 C) (Oral)  Resp 18  SpO2 94%  Intake/Output Summary (Last 24 hours) at 01/23/11 1055 Last data filed at 01/23/11 0900  Gross per 24 hour  Intake    988 ml  Output    575 ml  Net    413 ml                      Weight change:  Physical Exam: General: Alert and awake oriented x3 not in any acute distress. HEENT: anicteric sclera, pupils equal reactive to light and accommodation CVS: S1-S2 heard, no murmur rubs or gallops Chest: clear to auscultation bilaterally, no wheezing rales or rhonchi Abdomen:  normal bowel sounds, soft, nontender, nondistended, no organomegaly Neuro: Cranial nerves II-XII intact, no focal neurological deficits Extremities: no cyanosis, no clubbing or edema noted bilaterally   Lab Results:  Basename 01/22/11 1847 01/22/11 0849  NA -- 137  K -- 4.0  CL -- 105  CO2 -- 20  GLUCOSE -- 124*  BUN -- 38*  CREATININE 1.24 1.17  CALCIUM -- 8.5  MG -- --  PHOS -- --    Basename 01/22/11 0849  AST 31  ALT 18  ALKPHOS 33*  BILITOT 0.5  PROT 6.4  ALBUMIN 3.6    Basename 01/22/11 0849  LIPASE 25  AMYLASE --    Basename 01/22/11 1847 01/22/11 1033  WBC 11.9* 8.8  NEUTROABS -- 7.3  HGB 15.3 15.4  HCT 44.6 45.0  MCV 89.6 90.0  PLT 178 159   Studies/Results: Dg Chest 2 View IMPRESSION: Infiltration or atelectasis in both lung bases, greater on the right, suggesting pneumonia.  Right upper quadrant suggest possible small bowel dilatation and air-fluid levels.  Obstruction should be excluded.    Ct Abdomen Pelvis W Contrast 01/20/2011  *RADIOLOGY REPORT*  IMPRESSION: Postoperative changes with some total colectomy and ileal sigmoid anastomoses.  Mild distension with fluid filled small bowel loops  extending to the anus.  Although anal obstruction is not excluded, changes more likely represent ileus.  No significant wall thickening.    Dg Abd Acute W/chest 01/22/2011  *RADIOLOGY REPORT*  IMPRESSION: Findings suspicious for small bowel obstruction. Consider CT.  Medications: Scheduled Meds:   . aspirin EC  81 mg Oral Daily  . citalopram  20 mg Oral Daily  . enoxaparin  40 mg Subcutaneous Q24H  . fluticasone  2 spray Each Nare Daily  . gabapentin  400 mg Oral TID  .  HYDROmorphone (DILAUDID) injection  1 mg Intravenous Once  .  HYDROmorphone (DILAUDID) injection  1 mg Intravenous Once  . lisinopril  10 mg Oral Daily  . morphine      . nebivolol  5 mg Oral Daily  . pantoprazole (PROTONIX) IV  40 mg Intravenous Q24H  . simvastatin  40 mg Oral QHS  . DISCONTD: aspirin  81 mg Oral Daily   Continuous Infusions:   . sodium chloride 75 mL/hr at 01/22/11 2331   PRN Meds:.acetaminophen, albuterol, alum & mag hydroxide-simeth, HYDROcodone-acetaminophen, morphine, promethazine, promethazine, zolpidem  ASSESSMENT & PLAN: Principal Problem:  *Ileus Active Problems:  HYPERTENSION  CAD  Dyslipidemia  1. N/V/D:  Ileus versus gastroenteritis. Patient of spouse had the same symptoms which might suggest it is gastroenteritis. But the CT scan findings suggesting ileus. Patient is status post subtotal colectomy and he is already have a loose stool for the past year. Patient denies any nausea vomiting or any bowel movements since yesterday he still passes gas. General surgery is following.  2. Hypertension: We will continue home medications. Patient is n.p.o. except for medications and ice chips.   3. Dyslipidemia: Stable no changes will be done to his medication.  4. Status post subtotal colectomy: Patient did have history of extensive diverticulosis, polyposis and GI bleed. No bleeding since the subtotal colectomy patient admitted for loose stools since then.   LOS: 1 day   Sindhu Nguyen  A 01/23/2011, 10:55 AM

## 2011-01-24 ENCOUNTER — Other Ambulatory Visit: Payer: Self-pay

## 2011-01-24 ENCOUNTER — Inpatient Hospital Stay (HOSPITAL_COMMUNITY): Payer: Medicare Other

## 2011-01-24 LAB — GLUCOSE, CAPILLARY: Glucose-Capillary: 105 mg/dL — ABNORMAL HIGH (ref 70–99)

## 2011-01-24 LAB — CBC
Hemoglobin: 13.5 g/dL (ref 13.0–17.0)
MCH: 30.1 pg (ref 26.0–34.0)
MCHC: 33.2 g/dL (ref 30.0–36.0)
RDW: 13.9 % (ref 11.5–15.5)

## 2011-01-24 LAB — URINE MICROSCOPIC-ADD ON

## 2011-01-24 LAB — BASIC METABOLIC PANEL
Calcium: 8.5 mg/dL (ref 8.4–10.5)
GFR calc Af Amer: 38 mL/min — ABNORMAL LOW (ref >90)
GFR calc non Af Amer: 33 mL/min — ABNORMAL LOW (ref >90)
Potassium: 3.9 mEq/L (ref 3.5–5.1)
Sodium: 141 mEq/L (ref 135–145)

## 2011-01-24 LAB — DIFFERENTIAL
Basophils Absolute: 0 10*3/uL (ref 0.0–0.1)
Basophils Relative: 0 % (ref 0–1)
Eosinophils Absolute: 0 10*3/uL (ref 0.0–0.7)
Monocytes Relative: 4 % (ref 3–12)
Neutro Abs: 13.6 10*3/uL — ABNORMAL HIGH (ref 1.7–7.7)
Neutrophils Relative %: 92 % — ABNORMAL HIGH (ref 43–77)

## 2011-01-24 LAB — URINE CULTURE: Culture: NO GROWTH

## 2011-01-24 LAB — URINALYSIS, ROUTINE W REFLEX MICROSCOPIC
Glucose, UA: NEGATIVE mg/dL
Leukocytes, UA: NEGATIVE
Specific Gravity, Urine: 1.027 (ref 1.005–1.030)
pH: 6 (ref 5.0–8.0)

## 2011-01-24 LAB — INFLUENZA PANEL BY PCR (TYPE A & B): Influenza A By PCR: NEGATIVE

## 2011-01-24 MED ORDER — HYDROMORPHONE HCL PF 1 MG/ML IJ SOLN
1.0000 mg | INTRAMUSCULAR | Status: DC | PRN
  Administered 2011-01-24 – 2011-01-26 (×9): 1 mg via INTRAVENOUS
  Filled 2011-01-24 (×9): qty 1

## 2011-01-24 MED ORDER — AZITHROMYCIN 500 MG IV SOLR
500.0000 mg | INTRAVENOUS | Status: DC
  Filled 2011-01-24: qty 500

## 2011-01-24 MED ORDER — HYDRALAZINE HCL 20 MG/ML IJ SOLN
5.0000 mg | Freq: Four times a day (QID) | INTRAMUSCULAR | Status: DC | PRN
  Filled 2011-01-24: qty 0.25

## 2011-01-24 MED ORDER — LORAZEPAM 2 MG/ML IJ SOLN
1.0000 mg | Freq: Four times a day (QID) | INTRAMUSCULAR | Status: DC | PRN
  Administered 2011-01-25: 1 mg via INTRAVENOUS
  Filled 2011-01-24: qty 1

## 2011-01-24 MED ORDER — WHITE PETROLATUM GEL
Status: AC
  Administered 2011-01-24: 15:00:00
  Filled 2011-01-24: qty 5

## 2011-01-24 MED ORDER — POTASSIUM CHLORIDE CRYS ER 20 MEQ PO TBCR
40.0000 meq | EXTENDED_RELEASE_TABLET | Freq: Once | ORAL | Status: AC
  Administered 2011-01-24: 40 meq via ORAL
  Filled 2011-01-24: qty 2

## 2011-01-24 MED ORDER — LORAZEPAM 1 MG PO TABS
1.0000 mg | ORAL_TABLET | Freq: Four times a day (QID) | ORAL | Status: DC | PRN
  Administered 2011-01-24: 1 mg via ORAL
  Filled 2011-01-24: qty 1

## 2011-01-24 MED ORDER — CEFTRIAXONE SODIUM 1 G IJ SOLR
1.0000 g | INTRAMUSCULAR | Status: DC
  Filled 2011-01-24: qty 10

## 2011-01-24 MED ORDER — PANTOPRAZOLE SODIUM 40 MG IV SOLR
40.0000 mg | Freq: Two times a day (BID) | INTRAVENOUS | Status: DC
  Administered 2011-01-24 – 2011-02-03 (×21): 40 mg via INTRAVENOUS
  Filled 2011-01-24 (×24): qty 40

## 2011-01-24 NOTE — Progress Notes (Signed)
Called to evaluate patient for rigors and low O2 sats. On arrival, patient was awake and responsive with upper extremity shaking. Patient c/o headache. Noted upper airway wheezing with a slight posterior wheeze. Rectal temp of 100.9 was noted. Patient was repositioned for comfort, given Tylenol and Morphine for pain. Patient symptoms resolved after about 10 minutes. Patient was also given a Albuterol HHN. No further interventions required at this time. Unit RN to notify on-call MD of situation.

## 2011-01-24 NOTE — Progress Notes (Signed)
   Patient has some worsening in his SBO, at least radiologically.  NGT placed by general surgery and ILWS suction started.  All PO medications discontinued.  Dusten Ellinwood A  01/24/2011, 5:33 PM

## 2011-01-24 NOTE — Progress Notes (Signed)
Flatus, but still feels quite distended.  X-ray appears to have worsening SB dilatation. Abd - distended, epigastric tenderness.  Will place NG tube - ILWS Repeat x-rays May require repeat CT vs exploratory laparotomy if no improvement.  Imogene Burn. Georgette Dover, MD, [facility] Surgery  01/24/2011 12:52 PM

## 2011-01-24 NOTE — Progress Notes (Signed)
Called by nurse secondary to incident. Pt asked for blanket then began to shiver. Was wheezing, so rapid response called. MSO4 2mg  and Tylenol given. Neb tx given. T 101.9.  Bld cxs from 12/11 are pending. Pt seen immediately after incident and was better. No CP, n/v/d, SOB, syncope. Lungs CTA. RRR. Orders: CXR, EKG, Flu a/b, BMP, CBC with diff, Ua C&S, prolactin. Vs changed to q2hrs. IVF increased to 100 cc/hr secondary to mild decreased BP. Pt remembers entire incident. T back down to 43.  Tylene Fantasia, NP [facility]

## 2011-01-24 NOTE — Progress Notes (Signed)
DAILY PROGRESS NOTE                              [facility]  SUBJECTIVE: No BMs. He still has passes some gass. Had low grade fever of 101.3. No evidence of infiltrate in the CXR. I will check Flu and UA. Now patient is sitting on the bed getting sponge bath, denies any complaints  OBJECTIVE: BP 95/54  Pulse 74  Temp(Src) 99.1 F (37.3 C) (Oral)  Resp 20  SpO2 96%  Intake/Output Summary (Last 24 hours) at 01/24/11 [id number] Last data filed at 01/24/11 0300  Gross per 24 hour  Intake     60 ml  Output    350 ml  Net   -290 ml                      Weight change:  Physical Exam: General: Alert and awake oriented x3 not in any acute distress. HEENT: anicteric sclera, pupils equal reactive to light and accommodation CVS: S1-S2 heard, no murmur rubs or gallops Chest: clear to auscultation bilaterally, no wheezing rales or rhonchi Abdomen:  normal bowel sounds, soft, nontender, nondistended, no organomegaly Neuro: Cranial nerves II-XII intact, no focal neurological deficits Extremities: no cyanosis, no clubbing or edema noted bilaterally   Lab Results:  Basename 01/24/11 0638 01/22/11 1847 01/22/11 0849  NA 141 -- 137  K 3.9 -- 4.0  CL 109 -- 105  CO2 21 -- 20  GLUCOSE 90 -- 124*  BUN 51* -- 38*  CREATININE 1.88* 1.24 --  CALCIUM 8.5 -- 8.5  MG -- -- --  PHOS -- -- --    Basename 01/22/11 0849  AST 31  ALT 18  ALKPHOS 33*  BILITOT 0.5  PROT 6.4  ALBUMIN 3.6    Basename 01/22/11 0849  LIPASE 25  AMYLASE --    Basename 01/24/11 0638 01/22/11 1847 01/22/11 1033  WBC 14.7* 11.9* --  NEUTROABS 13.6* -- 7.3  HGB 13.5 15.3 --  HCT 40.7 44.6 --  MCV 90.8 89.6 --  PLT 151 178 --   Studies/Results: Dg Chest 2 View IMPRESSION: Infiltration or atelectasis in both lung bases, greater on the right, suggesting pneumonia.  Right upper quadrant suggest possible small bowel dilatation and air-fluid levels.  Obstruction should be excluded.     Ct Abdomen Pelvis W Contrast 01/20/2011  *RADIOLOGY REPORT*  IMPRESSION: Postoperative changes with some total colectomy and ileal sigmoid anastomoses.  Mild distension with fluid filled small bowel loops extending to the anus.  Although anal obstruction is not excluded, changes more likely represent ileus.  No significant wall thickening.    Dg Abd Acute W/chest 01/22/2011  *RADIOLOGY REPORT*  IMPRESSION: Findings suspicious for small bowel obstruction. Consider CT.  Medications: Scheduled Meds:    . aspirin EC  81 mg Oral Daily  . azithromycin  500 mg Intravenous Q24H  . cefTRIAXone (ROCEPHIN)  IV  1 g Intravenous Q24H  . citalopram  20 mg Oral Daily  . enoxaparin  40 mg Subcutaneous Q24H  . fluticasone  2 spray Each Nare Daily  . gabapentin  400 mg Oral TID  . lisinopril  10 mg Oral Daily  . nebivolol  5 mg Oral Daily  . pantoprazole (PROTONIX) IV  40 mg Intravenous Q24H  . potassium chloride  40 mEq Oral Once  . simvastatin  40 mg Oral QHS   Continuous Infusions:    .  sodium chloride 100 mL/hr at 01/24/11 0500   PRN Meds:.acetaminophen, albuterol, alum & mag hydroxide-simeth, HYDROcodone-acetaminophen, morphine, promethazine, promethazine, zolpidem  ASSESSMENT & PLAN: Principal Problem:  *Ileus Active Problems:  HYPERTENSION  CAD  Dyslipidemia  1. N/V/D: Ileus in the abdominal imaging. General Surgery is following. Ambulate the patient, and keep potassium more than 4. Per Gen Surg note, might try clears if nausea resolved.  2. Hypertension: We will continue home medications. Patient is n.p.o. except for medications and ice chips.   3. Dyslipidemia: Stable no changes will be done to his medication.  4. Status post subtotal colectomy: Patient did have history of extensive diverticulosis, polyposis and GI bleed. No bleeding since the subtotal colectomy patient admitted for loose stools since then.  5. Low grade Fever: check his urine and and Flu PCR. Defer ABx use for  now.   LOS: 2 days   Gary [name] A 01/24/2011, 9:37 AM

## 2011-01-24 NOTE — Progress Notes (Signed)
Subjective: Had episode of acute SOB overnight and fever.  Better this am.  Has had increased flatus last night  And felt like he needed to have a BM.  Mild nausea and abdominal pain this am.  Objective: Vital signs in last 24 hours: Temp:  [98.1 F (36.7 C)-101.3 F (38.5 C)] 99.1 F (37.3 C) (12/13 0800) Pulse Rate:  [74-87] 74  (12/13 0800) Resp:  [18-24] 20  (12/13 0800) BP: (90-113)/(54-61) 95/54 mmHg (12/13 0800) SpO2:  [90 %-97 %] 96 % (12/13 0800) Last BM Date: 01/22/11  Intake/Output this shift:    Physical Exam: BP 95/54  Pulse 74  Temp(Src) 99.1 F (37.3 C) (Oral)  Resp 20  SpO2 96% Gen:  In bed, nad Abd:  Mild distention, mild diffuse tenderness.  BS++.  Labs: CBC  Basename 01/24/11 0638 01/22/11 1847  WBC 14.7* 11.9*  HGB 13.5 15.3  HCT 40.7 44.6  PLT 151 178   BMET  Basename 01/24/11 0638 01/22/11 1847 01/22/11 0849  NA 141 -- 137  K 3.9 -- 4.0  CL 109 -- 105  CO2 21 -- 20  GLUCOSE 90 -- 124*  BUN 51* -- 38*  CREATININE 1.88* 1.24 --  CALCIUM 8.5 -- 8.5   LFT  Basename 01/22/11 0849  PROT 6.4  ALBUMIN 3.6  AST 31  ALT 18  ALKPHOS 33*  BILITOT 0.5  BILIDIR --  IBILI --  LIPASE 25    Studies/Results: Dg Chest 2 View  01/23/2011  *RADIOLOGY REPORT*  Clinical Data: Fever and upper abdominal pain  CHEST - 2 VIEW  Comparison: 06/27/2010  Findings: Shallow inspiration.  Infiltration or atelectasis in both lung bases, greater on the right.  This is new since the previous study may represent pneumonia.  Borderline heart size and pulmonary vascularity.  Calcified granulomas in the upper lungs.  The upper abdomen demonstrates bowel interposition in front of the liver and under the right hemidiaphragm.  Suggestion of mild gaseous distension of small bowel loops with air-fluid levels.  Small bowel obstruction should be considered.  Abdominal films likely would be useful in further evaluation.  IMPRESSION: Infiltration or atelectasis in both lung  bases, greater on the right, suggesting pneumonia.  Right upper quadrant suggest possible small bowel dilatation and air-fluid levels.  Obstruction should be excluded.  Original Report Authenticated By: Neale Burly, M.D.   Dg Chest Port 1 View  01/24/2011  *RADIOLOGY REPORT*  Clinical Data: Shortness of breath and wheezing.  Chills.  PORTABLE CHEST - 1 VIEW  Comparison: 01/22/2011  Findings: Shallow inspiration with elevation of the right hemidiaphragm.  Infiltration or atelectasis again demonstrated in both lung bases, similar to previous study.  No blunting of costophrenic angles.  No pneumothorax.  Gas filled dilated bowel loops again demonstrated in the right upper quadrant.  Small bowel obstruction is not excluded.  IMPRESSION: Stable appearance of the chest since the previous study. Infiltration or atelectasis again demonstrated in the lung bases. Gaseous distension of small bowel loops in the right upper quadrant.  Original Report Authenticated By: Neale Burly, M.D.   Dg Abd Acute W/chest  01/22/2011  *RADIOLOGY REPORT*  Clinical Data: Epigastric pain and distention.  Nausea vomiting and diarrhea.  Weakness.  Fever.  Chills.  ACUTE ABDOMEN SERIES (ABDOMEN 2 VIEW & CHEST 1 VIEW)  Comparison: 01/20/2011 CT abdomen.  Most recent plain film chest of 06/27/2010.  Findings: Frontal view of the chest demonstrates midline trachea. Mild cardiomegaly, accentuated by low lung volumes.  Moderate right hemidiaphragm elevation is not significantly changed. No pleural effusion or pneumothorax.  A left upper lobe calcified granuloma. Mild vascular crowding and infrahilar atelectasis on the right.  Abominal films demonstrate no free intraperitoneal air on upright positioning.  Air fluid levels within the mid small bowel loops. Mottled lucency about the right abdomen is favored to be gas within nearly completely fluid filled bowel loops.  Small bowel dilatation at 4.0 cm in the right upper abdomen.  Paucity of distal gas.  Lumbosacral spine fixation.  IMPRESSION: Findings suspicious for small bowel obstruction. Consider CT.  Original Report Authenticated By: Areta Haber, M.D.    Assessment: Principal Problem:  *Ileus Active Problems:  HYPERTENSION  CAD  Dyslipidemia     Plan: -Febrile overnight and WBC increased, ?infiltrate vs. Atelectasis on x-ray, consider tx for CAP.  Do not think acute abdominal process contributing to fever/WBC.   -Still with mild nausea, will continue bowel rest for now.  Once nausea resolved, start clears.  LOS: 2 days    Gary [name] 01/24/2011

## 2011-01-24 NOTE — Progress Notes (Signed)
CNA called nurse to room.  When nurse arrived to room pt.  Was having rigors and increased shaking.  His neck area and face where redden.  I also could hear the patient wheezing.  VS- temp- 100.9 rectal, HR-120- BP- 164/114 while having rigors.  Pt. Was given Morphine 2mg  via IV and Tylenol 650mg .  Rapid Response team called.  02 at 4L per [place] applied.  Respiratory called and Albuterol breathing treatment given per respiratory.   02 sats 87-90 % on o2 @ 4L per [place].    After all interventions pt. Returned to resting comfortably.  BP-140/68  Spo2- 99 % on 02 at 3L per [place].  PA K. Baltazar Najjar was called and informed of above information.  She came to access pt. And new orders where given.  I will continue to follow progress of pt.  Dewaine Oats, RN

## 2011-01-25 ENCOUNTER — Inpatient Hospital Stay (HOSPITAL_COMMUNITY): Payer: Medicare Other

## 2011-01-25 DIAGNOSIS — K56609 Unspecified intestinal obstruction, unspecified as to partial versus complete obstruction: Secondary | ICD-10-CM | POA: Diagnosis present

## 2011-01-25 DIAGNOSIS — K810 Acute cholecystitis: Secondary | ICD-10-CM

## 2011-01-25 LAB — URINE CULTURE
Colony Count: NO GROWTH
Culture  Setup Time: 201212131611
Culture: NO GROWTH

## 2011-01-25 LAB — CBC
MCH: 30.1 pg (ref 26.0–34.0)
MCHC: 33 g/dL (ref 30.0–36.0)
MCV: 91.2 fL (ref 78.0–100.0)
Platelets: 183 10*3/uL (ref 150–400)

## 2011-01-25 LAB — HEPATIC FUNCTION PANEL
Alkaline Phosphatase: 61 U/L (ref 39–117)
Bilirubin, Direct: 0.6 mg/dL — ABNORMAL HIGH (ref 0.0–0.3)
Indirect Bilirubin: 0 mg/dL — ABNORMAL LOW (ref 0.3–0.9)
Total Protein: 6.2 g/dL (ref 6.0–8.3)

## 2011-01-25 LAB — BASIC METABOLIC PANEL
BUN: 51 mg/dL — ABNORMAL HIGH (ref 6–23)
Creatinine, Ser: 1.52 mg/dL — ABNORMAL HIGH (ref 0.50–1.35)
GFR calc Af Amer: 50 mL/min — ABNORMAL LOW (ref >90)
GFR calc non Af Amer: 43 mL/min — ABNORMAL LOW (ref >90)
Glucose, Bld: 143 mg/dL — ABNORMAL HIGH (ref 70–99)

## 2011-01-25 LAB — LIPASE, BLOOD: Lipase: 10 U/L — ABNORMAL LOW (ref 11–59)

## 2011-01-25 MED ORDER — IOHEXOL 300 MG/ML  SOLN
70.0000 mL | Freq: Once | INTRAMUSCULAR | Status: AC | PRN
  Administered 2011-01-25: 70 mL via INTRAVENOUS

## 2011-01-25 MED ORDER — IOHEXOL 300 MG/ML  SOLN: 20.0000 mL | INTRAMUSCULAR | Status: AC

## 2011-01-25 MED ORDER — SODIUM CHLORIDE 0.9 % IV SOLN
1.0000 g | INTRAVENOUS | Status: DC
  Administered 2011-01-25: 1 g via INTRAVENOUS
  Filled 2011-01-25 (×2): qty 1

## 2011-01-25 NOTE — Progress Notes (Signed)
DAILY PROGRESS NOTE                              [facility]  SUBJECTIVE: Patient spiking low-grade fevers of 100.4, he has negative UA chest x-ray blood culture still negative. CT scan to be repeated to rule out intra-abdominal infections. Patient was confused and incontinent this morning this is might be secondary to his fever/illness or secondary to medications.  OBJECTIVE: BP 128/73  Pulse 103  Temp(Src) 100.4 F (38 C) (Axillary)  Resp 30  Ht 5' 8.5" (1.74 m)  Wt 90.719 kg (200 lb)  BMI 29.97 kg/m2  SpO2 96%  Intake/Output Summary (Last 24 hours) at 01/25/11 1317 Last data filed at 01/25/11 1100  Gross per 24 hour  Intake    866 ml  Output    600 ml  Net    266 ml                      Weight change:  Physical Exam: General: Alert and awake oriented x3 not in any acute distress. HEENT: anicteric sclera, pupils equal reactive to light and accommodation CVS: S1-S2 heard, no murmur rubs or gallops Chest: clear to auscultation bilaterally, no wheezing rales or rhonchi Abdomen:  normal bowel sounds, soft, nontender, nondistended, no organomegaly Neuro: Cranial nerves II-XII intact, no focal neurological deficits Extremities: no cyanosis, no clubbing or edema noted bilaterally   Lab Results:  [facility] 01/25/11 0645 01/24/11 0638  NA 143 141  K 4.5 3.9  CL 111 109  CO2 17* 21  GLUCOSE 143* 90  BUN 51* 51*  CREATININE 1.52* 1.88*  CALCIUM 8.9 8.5  MG -- --  PHOS -- --    Basename 01/25/11 0645  AST 27  ALT 20  ALKPHOS 61  BILITOT 0.6  PROT 6.2  ALBUMIN 2.5*    Basename 01/25/11 0645  LIPASE 10*  AMYLASE --    Basename 01/25/11 0645 01/24/11 0638  WBC 5.9 14.7*  NEUTROABS -- 13.6*  HGB 15.1 13.5  HCT 45.8 40.7  MCV 91.2 90.8  PLT 183 151   Studies/Results: Dg Chest 2 View IMPRESSION: Infiltration or atelectasis in both lung bases, greater on the right, suggesting pneumonia.  Right upper quadrant suggest possible  small bowel dilatation and air-fluid levels.  Obstruction should be excluded.    Ct Abdomen Pelvis W Contrast 01/20/2011  *RADIOLOGY REPORT*  IMPRESSION: Postoperative changes with some total colectomy and ileal sigmoid anastomoses.  Mild distension with fluid filled small bowel loops extending to the anus.  Although anal obstruction is not excluded, changes more likely represent ileus.  No significant wall thickening.    Dg Abd Acute W/chest 01/22/2011  *RADIOLOGY REPORT*  IMPRESSION: Findings suspicious for small bowel obstruction. Consider CT.  Medications: Scheduled Meds:    . enoxaparin  40 mg Subcutaneous Q24H  . ertapenem  1 g Intravenous Q24H  . fluticasone  2 spray Each Nare Daily  . pantoprazole (PROTONIX) IV  40 mg Intravenous Q12H  . white petrolatum      . DISCONTD: aspirin EC  81 mg Oral Daily  . DISCONTD: citalopram  20 mg Oral Daily  . DISCONTD: gabapentin  400 mg Oral TID  . DISCONTD: lisinopril  10 mg Oral Daily  . DISCONTD: nebivolol  5 mg Oral Daily  . DISCONTD: pantoprazole (PROTONIX) IV  40 mg Intravenous Q24H  . DISCONTD: simvastatin  40 mg Oral  QHS   Continuous Infusions:    . sodium chloride 125 mL/hr at 01/25/11 0958   PRN Meds:.albuterol, hydrALAZINE, HYDROmorphone (DILAUDID) injection, LORazepam, promethazine, DISCONTD: acetaminophen, DISCONTD: alum & mag hydroxide-simeth, DISCONTD: LORazepam, DISCONTD: promethazine, DISCONTD: zolpidem  ASSESSMENT & PLAN: Principal Problem:  *Ileus Active Problems:  HYPERTENSION  CAD  Dyslipidemia  1. SBO versus ileus: General Surgery is following. Clinically patient is doing worse. He was confused and incontinent this morning he had a spike of low-grade fever. Surgery repeating CT scan to rule out intra-abdominal infections. Continue n.p.o.  2. Low grade Fever: His blood culture showed no growth to date. His influenza PCR, urinalysis and chest x-ray all negative. Patient started empirically on Invanz and CT of  abdomen and pelvis will be repeated. I will transfer him to telemetry bed for close monitoring  3. Hypertension: We will continue home medications. Patient is n.p.o. except for medications and ice chips.   4. Dyslipidemia: Stable no changes will be done to his medication.  5. Status post subtotal colectomy: Patient did have history of extensive diverticulosis, polyposis and GI bleed. No bleeding since the subtotal colectomy patient admitted for loose stools since then.   LOS: 3 days   Xcaret Morad A 01/25/2011, 1:17 PM

## 2011-01-25 NOTE — Progress Notes (Signed)
Subjective: He is febrile, confused, incontinent, and abdomen is still distended and tender.  He's pulled out the NG several times, there is 511ml in cannister now.      Objective: Vital signs in last 24 hours: Temp:  [98.2 F (36.8 C)-101.9 F (38.8 C)] 99.3 F (37.4 C) (12/14 0627) Pulse Rate:  [75-101] 101  (12/14 0627) Resp:  [18-42] 42  (12/14 0627) BP: (93-154)/(56-86) 154/86 mmHg (12/14 0627) SpO2:  [93 %-97 %] 94 % (12/14 0627) Weight:  [90.719 kg (200 lb)] 200 lb (90.719 kg) (12/13 1219) Last BM Date: 01/22/11  Intake/Output this shift:    Physical Exam: BP 154/86  Pulse 101  Temp(Src) 99.3 F (37.4 C) (Oral)  Resp 42  Ht 5' 8.5" (1.74 m)  Wt 90.719 kg (200 lb)  BMI 29.97 kg/m2  SpO2 94% Temp now:100.7 Gen: Confused , he needs instruction to hold the Thermometer in place. Abd: Still distended, tender, with hyperactive bowel sounds. Labs: CBC  Basename 01/24/11 0638 01/22/11 1847  WBC 14.7* 11.9*  HGB 13.5 15.3  HCT 40.7 44.6  PLT 151 178   BMET  Basename 01/25/11 0645 01/24/11 0638  NA 143 141  K 4.5 3.9  CL 111 109  CO2 17* 21  GLUCOSE 143* 90  BUN 51* 51*  CREATININE 1.52* 1.88*  CALCIUM 8.9 8.5   LFT No results found for this basename: PROT,ALBUMIN,AST,ALT,ALKPHOS,BILITOT,BILIDIR,IBILI,LIPASE in the last 72 hours  Studies/Results: Dg Chest Port 1 View  01/24/2011  *RADIOLOGY REPORT*  Clinical Data: Shortness of breath and wheezing.  Chills.  PORTABLE CHEST - 1 VIEW  Comparison: 01/22/2011  Findings: Shallow inspiration with elevation of the right hemidiaphragm.  Infiltration or atelectasis again demonstrated in both lung bases, similar to previous study.  No blunting of costophrenic angles.  No pneumothorax.  Gas filled dilated bowel loops again demonstrated in the right upper quadrant.  Small bowel obstruction is not excluded.  IMPRESSION: Stable appearance of the chest since the previous study. Infiltration or atelectasis again  demonstrated in the lung bases. Gaseous distension of small bowel loops in the right upper quadrant.  Original Report Authenticated By: Neale Burly, M.D.   Dg Abd Acute W/chest  01/25/2011  *RADIOLOGY REPORT*  Clinical Data: Abdominal distention.  Prior total colectomy.  ACUTE ABDOMEN SERIES (ABDOMEN 2 VIEW & CHEST 1 VIEW)  Comparison: 01/24/2011  Findings: The very low lung volumes are present bilaterally. Nasogastric tube tip projects over the stomach body.  There is likely atelectasis in both lower lobes.  The left side down lateral decubitus view of the abdomen demonstrates air fluid levels and scattered loops of dilated small bowel.  On supine imaging, the small bowel measures up to 6.2 cm in diameter, increased from previous.  No significant colonic stool or gas is observed.  IMPRESSION:  1.  Increasing distention of small bowel loops.  Although small bowel obstruction is favored based on the radiographic appearance, ileus is not totally excluded.  Original Report Authenticated By: Carron Curie, M.D.   Dg Abd Acute W/chest  01/24/2011  *RADIOLOGY REPORT*  Clinical Data: Abdominal pain and fever.  Follow up ileus versus small bowel obstruction.  ACUTE ABDOMEN SERIES (ABDOMEN 2 VIEW & CHEST 1 VIEW)  Comparison: 01/22/2011  Findings: Heart size appears moderately enlarged.  There is no pleural effusion or pulmonary edema.  Lung volumes are low.  Asymmetric elevation of the right hemidiaphragm noted.  No airspace consolidation.  Abnormal small bowel dilatation is again noted.  The small  bowel loops measure up to 5 cm.  This is increased from 4 cm previously.  Prior posterior hardware fixation is identified within the lower lumbar spine.  IMPRESSION:  1.  Progression of small bowel dilatation.  Original Report Authenticated By: Angelita Ingles, M.D.    Assessment: IMP:  1.Ileus vs obstruction 2.fever 3.Renal insuffiencey 4. Confusion 5.incontinent  Plan: I have ask nurse to cal  Medicine, I have ordered a CT/ and will talk with DR. Tsuei. Increase IV and add Invanz.                         LOS: 3 days    Jaymie Misch 01/25/2011

## 2011-01-25 NOTE — Progress Notes (Signed)
CT shows right lower lobe pneumonia as well as probable acute cholecystitis.  Will probably need cholecystectomy this admission.  On antibiotics.  Continue NG tube.    Imogene Burn. Georgette Dover, MD, [facility]  01/25/2011 5:14 PM

## 2011-01-26 ENCOUNTER — Inpatient Hospital Stay (HOSPITAL_COMMUNITY): Payer: Medicare Other

## 2011-01-26 ENCOUNTER — Other Ambulatory Visit: Payer: Self-pay

## 2011-01-26 ENCOUNTER — Encounter (HOSPITAL_COMMUNITY)
Admission: EM | Disposition: A | Discharge status: Home / Self Care | Payer: Self-pay | Source: Ambulatory Visit | Attending: Internal Medicine

## 2011-01-26 DIAGNOSIS — J9600 Acute respiratory failure, unspecified whether with hypoxia or hypercapnia: Secondary | ICD-10-CM

## 2011-01-26 DIAGNOSIS — K812 Acute cholecystitis with chronic cholecystitis: Secondary | ICD-10-CM | POA: Diagnosis present

## 2011-01-26 DIAGNOSIS — A419 Sepsis, unspecified organism: Secondary | ICD-10-CM

## 2011-01-26 DIAGNOSIS — K819 Cholecystitis, unspecified: Secondary | ICD-10-CM

## 2011-01-26 LAB — CARDIAC PANEL(CRET KIN+CKTOT+MB+TROPI)
CK, MB: 6.3 ng/mL (ref 0.3–4.0)
Relative Index: 1.4 (ref 0.0–2.5)
Relative Index: 1.4 (ref 0.0–2.5)
Total CK: 466 U/L — ABNORMAL HIGH (ref 7–232)
Troponin I: <0.3 ng/mL (ref <0.30)
Troponin I: <0.3 ng/mL (ref <0.30)
Troponin I: <0.3 ng/mL (ref <0.30)

## 2011-01-26 LAB — POCT I-STAT 3, ART BLOOD GAS (G3+)
O2 Saturation: 97 %
TCO2: 17 mmol/L (ref 0–100)
pCO2 arterial: 28.7 mmHg — ABNORMAL LOW (ref 35.0–45.0)
pO2, Arterial: 102 mmHg — ABNORMAL HIGH (ref 80.0–100.0)

## 2011-01-26 LAB — PROTIME-INR: INR: 1.45 (ref 0.00–1.49)

## 2011-01-26 LAB — BASIC METABOLIC PANEL
BUN: 50 mg/dL — ABNORMAL HIGH (ref 6–23)
Chloride: 118 mEq/L — ABNORMAL HIGH (ref 96–112)
Creatinine, Ser: 1.43 mg/dL — ABNORMAL HIGH (ref 0.50–1.35)
GFR calc non Af Amer: 46 mL/min — ABNORMAL LOW (ref >90)
Glucose, Bld: 160 mg/dL — ABNORMAL HIGH (ref 70–99)
Potassium: 3.7 mEq/L (ref 3.5–5.1)

## 2011-01-26 LAB — GLUCOSE, CAPILLARY: Glucose-Capillary: 151 mg/dL — ABNORMAL HIGH (ref 70–99)

## 2011-01-26 SURGERY — LAPAROSCOPIC CHOLECYSTECTOMY WITH INTRAOPERATIVE CHOLANGIOGRAM
Anesthesia: General

## 2011-01-26 MED ORDER — DILTIAZEM HCL 100 MG IV SOLR
5.0000 mg/h | INTRAVENOUS | Status: DC
  Filled 2011-01-26: qty 100

## 2011-01-26 MED ORDER — PIPERACILLIN-TAZOBACTAM 3.375 G IVPB
3.3750 g | Freq: Three times a day (TID) | INTRAVENOUS | Status: DC
  Administered 2011-01-26 – 2011-01-30 (×12): 3.375 g via INTRAVENOUS
  Filled 2011-01-26 (×18): qty 50

## 2011-01-26 MED ORDER — ACETAMINOPHEN 650 MG RE SUPP
650.0000 mg | Freq: Four times a day (QID) | RECTAL | Status: DC | PRN
  Administered 2011-01-26 – 2011-02-04 (×2): 650 mg via RECTAL
  Filled 2011-01-26 (×3): qty 1

## 2011-01-26 MED ORDER — SODIUM CHLORIDE 0.9 % IV SOLN
INTRAVENOUS | Status: DC
  Administered 2011-01-26: 12:00:00 via INTRAVENOUS

## 2011-01-26 MED ORDER — FLUCONAZOLE IN SODIUM CHLORIDE 400-0.9 MG/200ML-% IV SOLN
800.0000 mg | Freq: Once | INTRAVENOUS | Status: AC
  Administered 2011-01-26: 800 mg via INTRAVENOUS
  Filled 2011-01-26: qty 400

## 2011-01-26 MED ORDER — DILTIAZEM LOAD VIA INFUSION
20.0000 mg | Freq: Once | INTRAVENOUS | Status: DC
  Filled 2011-01-26: qty 20

## 2011-01-26 MED ORDER — AMIODARONE IV BOLUS ONLY 150 MG/100ML: 150.0000 mg | Freq: Once | INTRAVENOUS | Status: DC

## 2011-01-26 MED ORDER — HYDROMORPHONE HCL PF 1 MG/ML IJ SOLN
0.5000 mg | INTRAMUSCULAR | Status: DC | PRN
  Administered 2011-01-26 – 2011-01-29 (×11): 0.5 mg via INTRAVENOUS
  Filled 2011-01-26 (×11): qty 1

## 2011-01-26 MED ORDER — DEXTROSE 5 % IV SOLN
30.0000 mg/h | INTRAVENOUS | Status: AC
  Administered 2011-01-26 (×2): 30 mg/h via INTRAVENOUS
  Administered 2011-01-26 (×2): 60 mg/h via INTRAVENOUS
  Administered 2011-01-27: 30 mg/h via INTRAVENOUS
  Filled 2011-01-26 (×4): qty 9

## 2011-01-26 MED ORDER — METOPROLOL TARTRATE 1 MG/ML IV SOLN
5.0000 mg | Freq: Once | INTRAVENOUS | Status: AC
  Administered 2011-01-26: 5 mg via INTRAVENOUS
  Filled 2011-01-26: qty 5

## 2011-01-26 MED ORDER — FLUCONAZOLE IN SODIUM CHLORIDE 400-0.9 MG/200ML-% IV SOLN
400.0000 mg | INTRAVENOUS | Status: DC
  Administered 2011-01-27: 400 mg via INTRAVENOUS
  Filled 2011-01-26 (×2): qty 200

## 2011-01-26 MED ORDER — METOPROLOL TARTRATE 1 MG/ML IV SOLN
5.0000 mg | Freq: Once | INTRAVENOUS | Status: AC
  Administered 2011-01-26: 5 mg via INTRAVENOUS

## 2011-01-26 MED ORDER — SODIUM CHLORIDE 0.9 % IJ SOLN
INTRAMUSCULAR | Status: AC
  Administered 2011-01-26: 17:00:00
  Filled 2011-01-26: qty 10

## 2011-01-26 MED ORDER — PIPERACILLIN-TAZOBACTAM 4.5 G IVPB: 4.5000 g | Freq: Four times a day (QID) | INTRAVENOUS | Status: DC

## 2011-01-26 MED ORDER — VANCOMYCIN HCL 1000 MG IV SOLR
750.0000 mg | Freq: Two times a day (BID) | INTRAVENOUS | Status: DC
  Administered 2011-01-26 – 2011-01-28 (×4): 750 mg via INTRAVENOUS
  Filled 2011-01-26 (×6): qty 750

## 2011-01-26 MED ORDER — FENTANYL CITRATE 0.05 MG/ML IJ SOLN
INTRAMUSCULAR | Status: AC | PRN
  Administered 2011-01-26: 25 ug via INTRAVENOUS
  Administered 2011-01-26 (×2): 50 ug via INTRAVENOUS

## 2011-01-26 MED ORDER — DIPHENHYDRAMINE HCL 25 MG PO CAPS: 25.0000 mg | ORAL_CAPSULE | Freq: Once | ORAL | Status: DC

## 2011-01-26 MED ORDER — FLUCONAZOLE IN SODIUM CHLORIDE 400-0.9 MG/200ML-% IV SOLN: 800.0000 mg | INTRAVENOUS | Status: DC

## 2011-01-26 MED ORDER — DEXTROSE 5 % IV SOLN
60.0000 mg/h | INTRAVENOUS | Status: AC
  Administered 2011-01-26: 60 mg/h via INTRAVENOUS
  Filled 2011-01-26: qty 9

## 2011-01-26 MED ORDER — FENTANYL CITRATE 0.05 MG/ML IJ SOLN
INTRAMUSCULAR | Status: AC
  Filled 2011-01-26: qty 4

## 2011-01-26 MED ORDER — VANCOMYCIN HCL IN DEXTROSE 1-5 GM/200ML-% IV SOLN: 1000.0000 mg | Freq: Two times a day (BID) | INTRAVENOUS | Status: DC

## 2011-01-26 MED ORDER — DIPHENHYDRAMINE HCL 50 MG/ML IJ SOLN
25.0000 mg | Freq: Once | INTRAMUSCULAR | Status: AC
  Administered 2011-01-26: 25 mg via INTRAVENOUS
  Filled 2011-01-26: qty 1

## 2011-01-26 MED ORDER — METOPROLOL TARTRATE 1 MG/ML IV SOLN
INTRAVENOUS | Status: AC
  Filled 2011-01-26: qty 5

## 2011-01-26 MED ORDER — AMIODARONE LOAD VIA INFUSION
150.0000 mg | Freq: Once | INTRAVENOUS | Status: AC
  Administered 2011-01-26: 150 mg via INTRAVENOUS
  Filled 2011-01-26: qty 151.2

## 2011-01-26 MED ORDER — MIDAZOLAM HCL 5 MG/5ML IJ SOLN
INTRAMUSCULAR | Status: AC | PRN
  Administered 2011-01-26 (×2): 1 mg via INTRAVENOUS
  Administered 2011-01-26: 0.5 mg via INTRAVENOUS

## 2011-01-26 MED ORDER — VANCOMYCIN HCL 1000 MG IV SOLR
1500.0000 mg | Freq: Once | INTRAVENOUS | Status: AC
  Administered 2011-01-26: 1500 mg via INTRAVENOUS
  Filled 2011-01-26 (×2): qty 1500

## 2011-01-26 MED ORDER — MIDAZOLAM HCL 2 MG/2ML IJ SOLN
INTRAMUSCULAR | Status: AC
  Filled 2011-01-26: qty 4

## 2011-01-26 NOTE — H&P (View-Only) (Signed)
Subjective: Patient moved to step down with increasing SOB, tachy, increased abdominal pain  Objective: Vital signs in last 24 hours: Temp:  [97.5 F (36.4 C)-101.7 F (38.7 C)] 100 F (37.8 C) (12/15 0825) Pulse Rate:  [67-180] 107  (12/15 0800) Resp:  [25-32] 31  (12/15 0800) BP: (99-161)/(66-89) 145/89 mmHg (12/15 0800) SpO2:  [93 %-97 %] 97 % (12/15 0800) Weight:  [196 lb 3.4 oz (89 kg)-196 lb 6.9 oz (89.1 kg)] 196 lb 6.9 oz (89.1 kg) (12/15 0545) Last BM Date: 01/22/11  Intake/Output from previous day: 12/14 0701 - 12/15 0700 In: 1574.6 [I.V.:1399.6; NG/GT:175] Out: 1585 [Urine:835; Emesis/NG output:750] Intake/Output this shift:    General appearance: moderate distress GI: abnormal findings:  distended and guarding very tender in RUQ  Lab Results:   Basename 01/25/11 0645 01/24/11 0638  WBC 5.9 14.7*  HGB 15.1 13.5  HCT 45.8 40.7  PLT 183 151   BMET  Basename 01/25/11 0645 01/24/11 0638  NA 143 141  K 4.5 3.9  CL 111 109  CO2 17* 21  GLUCOSE 143* 90  BUN 51* 51*  CREATININE 1.52* 1.88*  CALCIUM 8.9 8.5   PT/INR No results found for this basename: LABPROT:2,INR:2 in the last 72 hours ABG No results found for this basename: PHART:2,PCO2:2,PO2:2,HCO3:2 in the last 72 hours  Studies/Results: Ct Abdomen Pelvis W Contrast  01/25/2011  *RADIOLOGY REPORT*  Clinical Data: Abdominal distention, history of prior total colectomy, evaluate for small bowel obstruction  CT ABDOMEN AND PELVIS WITH CONTRAST  Technique:  Multidetector CT imaging of the abdomen and pelvis was performed following the standard protocol during bolus administration of intravenous contrast.  Contrast: 55mL OMNIPAQUE IOHEXOL 300 MG/ML IV SOLN  Comparison: Abdomen films from 01/25/2011  Findings: There is atelectasis of the right lower lobe with probable small effusion.  This may represent pneumonia or central endobronchial lesion.  Coronary artery calcifications are noted as well and there is  cardiomegaly present.  There is a small amount of perihepatic fluid present.  The liver has a normal appearance with no focal abnormality.  However the gallbladder is abnormal being distended and very thick-walled. Although no definite gallstones are seen, acute cholecystitis is the primary consideration.  The pancreas appears fatty replaced. The adrenal glands and spleen are unremarkable.  The stomach is filled with oral contrast and is unremarkable.  The kidneys enhance with no calculus or mass and no hydronephrosis is seen.  The abdominal aorta is normal in caliber with moderate atheromatous change present.  As noted on plain film there is some gaseous distention of small bowel in this patient with a history of colectomy.  The small bowel appears slightly prominent to the point of anastomosis in the mid abdomen with the remaining segment of rectosigmoid colon.  This small bowel distention may reflect ileus versus partial small bowel obstruction with stricture at the anastomotic site.  There is fluid within the remaining segment of colon.  Hardware for fusion of the lower lumbar spine is noted.  IMPRESSION:  1.  Dilated gallbladder with thickened gallbladder wall.  No definite gallstones.  These findings are highly suspicious for acute cholecystitis.  Correlate clinically. 2.  Right lower lobe atelectasis and possible pneumonia with small right effusion. 3.  Perihepatic fluid. 4.  Small bowel distention may reflect ileus or partial small bowel obstruction to the point of anastomosis with the remaining segment of colon in the pelvis.  Original Report Authenticated By: Joretta Bachelor, M.D.   Dg Abd Acute  W/chest  01/25/2011  *RADIOLOGY REPORT*  Clinical Data: Abdominal distention.  Prior total colectomy.  ACUTE ABDOMEN SERIES (ABDOMEN 2 VIEW & CHEST 1 VIEW)  Comparison: 01/24/2011  Findings: The very low lung volumes are present bilaterally. Nasogastric tube tip projects over the stomach body.  There is likely  atelectasis in both lower lobes.  The left side down lateral decubitus view of the abdomen demonstrates air fluid levels and scattered loops of dilated small bowel.  On supine imaging, the small bowel measures up to 6.2 cm in diameter, increased from previous.  No significant colonic stool or gas is observed.  IMPRESSION:  1.  Increasing distention of small bowel loops.  Although small bowel obstruction is favored based on the radiographic appearance, ileus is not totally excluded.  Original Report Authenticated By: Carron Curie, M.D.    Anti-infectives: Anti-infectives     Start     Dose/Rate Route Frequency Ordered Stop   01/26/11 1200   piperacillin-tazobactam (ZOSYN) IVPB 4.5 g        4.5 g 200 mL/hr over 30 Minutes Intravenous 4 times per day 01/26/11 0850     01/26/11 0900   fluconazole (DIFLUCAN) IVPB 800 mg        800 mg 400 mL/hr over 60 Minutes Intravenous Every 24 hours 01/26/11 0850     01/26/11 0900   vancomycin (VANCOCIN) IVPB 1000 mg/200 mL premix     Comments: STAT      1,000 mg 200 mL/hr over 60 Minutes Intravenous Every 12 hours 01/26/11 0850     01/25/11 1030   ertapenem (INVANZ) 1 g in sodium chloride 0.9 % 50 mL IVPB  Status:  Discontinued        1 g 100 mL/hr over 30 Minutes Intravenous Every 24 hours 01/25/11 0952 01/26/11 0850   01/24/11 1000   azithromycin (ZITHROMAX) 500 mg in dextrose 5 % 250 mL IVPB  Status:  Discontinued        500 mg 250 mL/hr over 60 Minutes Intravenous Every 24 hours 01/24/11 0840 01/24/11 0945   01/24/11 0930   cefTRIAXone (ROCEPHIN) 1 g in dextrose 5 % 50 mL IVPB  Status:  Discontinued        1 g 100 mL/hr over 30 Minutes Intravenous Every 24 hours 01/24/11 0840 01/24/11 0945          Assessment/Plan:  Discussed with CCM.  Given condition, will have IR perform percutaneous cholecystostomy.  If fails, will need cholecystectomy.  LOS: 4 days    Lisel Siegrist A 01/26/2011

## 2011-01-26 NOTE — Procedures (Signed)
Central Venous Catheter Insertion Procedure Note HAYLEY DESHAIES [id number] Dec 26, 1932  Procedure: Insertion of Central Venous Catheter Indications: Drug and/or fluid administration  Procedure Details Consent: Risks of procedure as well as the alternatives and risks of each were explained to the (patient/caregiver).  Consent for procedure obtained. Time Out: Verified patient identification, verified procedure, site/side was marked, verified correct patient position, special equipment/implants available, medications/allergies/relevent history reviewed, required imaging and test results available.  Performed  Maximum sterile technique was used including antiseptics, cap, gloves, gown, hand hygiene, mask and sheet. Skin prep: Chlorhexidine; local anesthetic administered A antimicrobial bonded/coated triple lumen catheter was placed in the left internal jugular vein using the Seldinger technique. Ultrasound guidance used.yes Catheter placed to 20 cm. Blood aspirated via all 3 ports and then flushed x 3. Line sutured x 2 and dressing applied.  Evaluation Blood flow good Complications: No apparent complications Patient did tolerate procedure well. Chest X-ray ordered to verify placement.  CXR: pending.  Richardson Landry Fernado Brigante ACNP Maryanna Shape PCCM Pager [phone] till 3 pm If no answer page [phone] 01/26/2011, 9:51 AM

## 2011-01-26 NOTE — Interval H&P Note (Cosign Needed)
History and Physical Interval Note:  01/26/2011 11:40 AM  Gary [name]  Is scheduled for percutaneous cholecystostomy today with the diagnosis of Acute Cholecystitis  The various methods of treatment have been discussed with the patient and family. After consideration of risks, benefits and other options for treatment, the patient has consented to the above procedure.  .  The patients' history has been reviewed, patient examined, no change in status, stable for the above procedure..  I have reviewed the patients' chart and labs.  Questions were answered to the patient's satisfaction.   Past Medical History  Diagnosis Date  . Previous back surgery 1997  . Hypertension   . Hemorrhoids   . Chronic kidney disease   . Kidney stones   . Dyslipidemia   . Diverticulosis   . Colon polyps   . Angina   . Heart attack 07/2009  . Blood transfusion   . Anemia   . GI bleeding after 07/2009    "triggered by Plavix & ASA; from my diverticulosis"  . GERD (gastroesophageal reflux disease)   . Headache     occasionally  . Anxiety   . Arthritis     "hips and lower back"  . Chronic back pain greater than 3 months duration    Results for orders placed during the hospital encounter of 01/22/11  COMPREHENSIVE METABOLIC PANEL      Component Value Range   Sodium 137  135 - 145 (mEq/L)   Potassium 4.0  3.5 - 5.1 (mEq/L)   Chloride 105  96 - 112 (mEq/L)   CO2 20  19 - 32 (mEq/L)   Glucose, Bld 124 (*) 70 - 99 (mg/dL)   BUN 38 (*) 6 - 23 (mg/dL)   Creatinine, Ser 1.17  0.50 - 1.35 (mg/dL)   Calcium 8.5  8.4 - 10.5 (mg/dL)   Total Protein 6.4  6.0 - 8.3 (g/dL)   Albumin 3.6  3.5 - 5.2 (g/dL)   AST 31  0 - 37 (U/L)   ALT 18  0 - 53 (U/L)   Alkaline Phosphatase 33 (*) 39 - 117 (U/L)   Total Bilirubin 0.5  0.3 - 1.2 (mg/dL)   GFR calc non Af Amer 59 (*) >90 (mL/min)   GFR calc Af Amer 68 (*) >90 (mL/min)  LIPASE, BLOOD      Component Value Range   Lipase 25  11 - 59 (U/L)  CBC      Component  Value Range   WBC 8.8  4.0 - 10.5 (K/uL)   RBC 5.00  4.22 - 5.81 (MIL/uL)   Hemoglobin 15.4  13.0 - 17.0 (g/dL)   HCT 45.0  39.0 - 52.0 (%)   MCV 90.0  78.0 - 100.0 (fL)   MCH 30.8  26.0 - 34.0 (pg)   MCHC 34.2  30.0 - 36.0 (g/dL)   RDW 13.2  11.5 - 15.5 (%)   Platelets 159  150 - 400 (K/uL)  DIFFERENTIAL      Component Value Range   Neutrophils Relative 83 (*) 43 - 77 (%)   Neutro Abs 7.3  1.7 - 7.7 (K/uL)   Lymphocytes Relative 9 (*) 12 - 46 (%)   Lymphs Abs 0.8  0.7 - 4.0 (K/uL)   Monocytes Relative 7  3 - 12 (%)   Monocytes Absolute 0.7  0.1 - 1.0 (K/uL)   Eosinophils Relative 1  0 - 5 (%)   Eosinophils Absolute 0.0  0.0 - 0.7 (K/uL)   Basophils Relative 0  0 - 1 (%)   Basophils Absolute 0.0  0.0 - 0.1 (K/uL)  CBC      Component Value Range   WBC 11.9 (*) 4.0 - 10.5 (K/uL)   RBC 4.98  4.22 - 5.81 (MIL/uL)   Hemoglobin 15.3  13.0 - 17.0 (g/dL)   HCT 44.6  39.0 - 52.0 (%)   MCV 89.6  78.0 - 100.0 (fL)   MCH 30.7  26.0 - 34.0 (pg)   MCHC 34.3  30.0 - 36.0 (g/dL)   RDW 13.3  11.5 - 15.5 (%)   Platelets 178  150 - 400 (K/uL)  CREATININE, SERUM      Component Value Range   Creatinine, Ser 1.24  0.50 - 1.35 (mg/dL)   GFR calc non Af Amer 55 (*) >90 (mL/min)   GFR calc Af Amer 63 (*) >90 (mL/min)  URINALYSIS, ROUTINE W REFLEX MICROSCOPIC      Component Value Range   Color, Urine AMBER (*) YELLOW    APPearance CLEAR  CLEAR    Specific Gravity, Urine 1.027  1.005 - 1.030    pH 5.5  5.0 - 8.0    Glucose, UA 100 (*) NEGATIVE (mg/dL)   Hgb urine dipstick SMALL (*) NEGATIVE    Bilirubin Urine SMALL (*) NEGATIVE    Ketones, ur 40 (*) NEGATIVE (mg/dL)   Protein, ur 30 (*) NEGATIVE (mg/dL)   Urobilinogen, UA 0.2  0.0 - 1.0 (mg/dL)   Nitrite NEGATIVE  NEGATIVE    Leukocytes, UA NEGATIVE  NEGATIVE   URINE CULTURE      Component Value Range   Specimen Description URINE, RANDOM     Special Requests NONE     Setup Time [id number]     Colony Count NO GROWTH     Culture NO  GROWTH     Report Status 01/24/2011 FINAL    CULTURE, BLOOD (ROUTINE X 2)      Component Value Range   Specimen Description BLOOD RIGHT ARM     Special Requests BOTTLES DRAWN AEROBIC AND ANAEROBIC 10CC     Setup Time [id number]     Culture       Value:        BLOOD CULTURE RECEIVED NO GROWTH TO DATE CULTURE WILL BE HELD FOR 5 DAYS BEFORE ISSUING A FINAL NEGATIVE REPORT   Report Status PENDING    CULTURE, BLOOD (ROUTINE X 2)      Component Value Range   Specimen Description BLOOD RIGHT HAND     Special Requests BOTTLES DRAWN AEROBIC AND ANAEROBIC 10CC     Setup Time [id number]     Culture       Value:        BLOOD CULTURE RECEIVED NO GROWTH TO DATE CULTURE WILL BE HELD FOR 5 DAYS BEFORE ISSUING A FINAL NEGATIVE REPORT   Report Status PENDING    URINE MICROSCOPIC-ADD ON      Component Value Range   Squamous Epithelial / LPF RARE  RARE    WBC, UA 0-2  <3 (WBC/hpf)   RBC / HPF 0-2  <3 (RBC/hpf)   Bacteria, UA RARE  RARE    Casts GRANULAR CAST (*) NEGATIVE    Urine-Other MUCOUS PRESENT    BASIC METABOLIC PANEL      Component Value Range   Sodium 141  135 - 145 (mEq/L)   Potassium 3.9  3.5 - 5.1 (mEq/L)   Chloride 109  96 - 112 (mEq/L)   CO2 21  19 - 32 (  mEq/L)   Glucose, Bld 90  70 - 99 (mg/dL)   BUN 51 (*) 6 - 23 (mg/dL)   Creatinine, Ser 1.88 (*) 0.50 - 1.35 (mg/dL)   Calcium 8.5  8.4 - 10.5 (mg/dL)   GFR calc non Af Amer 33 (*) >90 (mL/min)   GFR calc Af Amer 38 (*) >90 (mL/min)  GLUCOSE, CAPILLARY      Component Value Range   Glucose-Capillary 105 (*) 70 - 99 (mg/dL)   Comment 1 Notify RN     Comment 2 Documented in Chart    CBC      Component Value Range   WBC 14.7 (*) 4.0 - 10.5 (K/uL)   RBC 4.48  4.22 - 5.81 (MIL/uL)   Hemoglobin 13.5  13.0 - 17.0 (g/dL)   HCT 40.7  39.0 - 52.0 (%)   MCV 90.8  78.0 - 100.0 (fL)   MCH 30.1  26.0 - 34.0 (pg)   MCHC 33.2  30.0 - 36.0 (g/dL)   RDW 13.9  11.5 - 15.5 (%)   Platelets 151  150 - 400 (K/uL)  DIFFERENTIAL       Component Value Range   Neutrophils Relative 92 (*) 43 - 77 (%)   Neutro Abs 13.6 (*) 1.7 - 7.7 (K/uL)   Lymphocytes Relative 3 (*) 12 - 46 (%)   Lymphs Abs 0.5 (*) 0.7 - 4.0 (K/uL)   Monocytes Relative 4  3 - 12 (%)   Monocytes Absolute 0.6  0.1 - 1.0 (K/uL)   Eosinophils Relative 0  0 - 5 (%)   Eosinophils Absolute 0.0  0.0 - 0.7 (K/uL)   Basophils Relative 0  0 - 1 (%)   Basophils Absolute 0.0  0.0 - 0.1 (K/uL)  URINE CULTURE      Component Value Range   Specimen Description URINE, CLEAN CATCH     Special Requests NONE     Setup Time [id number]     Colony Count NO GROWTH     Culture NO GROWTH     Report Status 01/25/2011 FINAL    PROLACTIN      Component Value Range   Prolactin 7.5  2.1 - 17.1 (ng/mL)  INFLUENZA PANEL BY PCR      Component Value Range   Influenza A By PCR NEGATIVE  NEGATIVE    Influenza B By PCR NEGATIVE  NEGATIVE    H1N1 flu by pcr NOT DETECTED  NOT DETECTED   URINALYSIS, ROUTINE W REFLEX MICROSCOPIC      Component Value Range   Color, Urine AMBER (*) YELLOW    APPearance CLEAR  CLEAR    Specific Gravity, Urine 1.027  1.005 - 1.030    pH 6.0  5.0 - 8.0    Glucose, UA NEGATIVE  NEGATIVE (mg/dL)   Hgb urine dipstick MODERATE (*) NEGATIVE    Bilirubin Urine MODERATE (*) NEGATIVE    Ketones, ur 40 (*) NEGATIVE (mg/dL)   Protein, ur 100 (*) NEGATIVE (mg/dL)   Urobilinogen, UA 2.0 (*) 0.0 - 1.0 (mg/dL)   Nitrite NEGATIVE  NEGATIVE    Leukocytes, UA NEGATIVE  NEGATIVE   URINE MICROSCOPIC-ADD ON      Component Value Range   Squamous Epithelial / LPF RARE  RARE    WBC, UA 0-2  <3 (WBC/hpf)   RBC / HPF 3-6  <3 (RBC/hpf)   Bacteria, UA RARE  RARE    Casts GRANULAR CAST (*) NEGATIVE    Urine-Other MUCOUS PRESENT    BASIC METABOLIC  PANEL      Component Value Range   Sodium 143  135 - 145 (mEq/L)   Potassium 4.5  3.5 - 5.1 (mEq/L)   Chloride 111  96 - 112 (mEq/L)   CO2 17 (*) 19 - 32 (mEq/L)   Glucose, Bld 143 (*) 70 - 99 (mg/dL)   BUN 51 (*) 6 - 23  (mg/dL)   Creatinine, Ser 1.52 (*) 0.50 - 1.35 (mg/dL)   Calcium 8.9  8.4 - 10.5 (mg/dL)   GFR calc non Af Amer 43 (*) >90 (mL/min)   GFR calc Af Amer 50 (*) >90 (mL/min)  GLUCOSE, CAPILLARY      Component Value Range   Glucose-Capillary 134 (*) 70 - 99 (mg/dL)  CBC      Component Value Range   WBC 5.9  4.0 - 10.5 (K/uL)   RBC 5.02  4.22 - 5.81 (MIL/uL)   Hemoglobin 15.1  13.0 - 17.0 (g/dL)   HCT 45.8  39.0 - 52.0 (%)   MCV 91.2  78.0 - 100.0 (fL)   MCH 30.1  26.0 - 34.0 (pg)   MCHC 33.0  30.0 - 36.0 (g/dL)   RDW 14.3  11.5 - 15.5 (%)   Platelets 183  150 - 400 (K/uL)  HEPATIC FUNCTION PANEL      Component Value Range   Total Protein 6.2  6.0 - 8.3 (g/dL)   Albumin 2.5 (*) 3.5 - 5.2 (g/dL)   AST 27  0 - 37 (U/L)   ALT 20  0 - 53 (U/L)   Alkaline Phosphatase 61  39 - 117 (U/L)   Total Bilirubin 0.6  0.3 - 1.2 (mg/dL)   Bilirubin, Direct 0.6 (*) 0.0 - 0.3 (mg/dL)   Indirect Bilirubin 0.0 (*) 0.3 - 0.9 (mg/dL)  LIPASE, BLOOD      Component Value Range   Lipase 10 (*) 11 - 59 (U/L)  CARDIAC PANEL(CRET KIN+CKTOT+MB+TROPI)      Component Value Range   Total CK 484 (*) 7 - 232 (U/L)   CK, MB PENDING  0.3 - 4.0 (ng/mL)   Troponin I <0.30  <0.30 (ng/mL)   Relative Index PENDING  0.0 - 2.5   MRSA PCR SCREENING      Component Value Range   MRSA by PCR NEGATIVE  NEGATIVE   GLUCOSE, CAPILLARY      Component Value Range   Glucose-Capillary 151 (*) 70 - 99 (mg/dL)  BASIC METABOLIC PANEL      Component Value Range   Sodium 147 (*) 135 - 145 (mEq/L)   Potassium 3.7  3.5 - 5.1 (mEq/L)   Chloride 118 (*) 96 - 112 (mEq/L)   CO2 19  19 - 32 (mEq/L)   Glucose, Bld 160 (*) 70 - 99 (mg/dL)   BUN 50 (*) 6 - 23 (mg/dL)   Creatinine, Ser 1.43 (*) 0.50 - 1.35 (mg/dL)   Calcium 9.0  8.4 - 10.5 (mg/dL)   GFR calc non Af Amer 46 (*) >90 (mL/min)   GFR calc Af Amer 53 (*) >90 (mL/min)  LACTIC ACID, PLASMA      Component Value Range   Lactic Acid, Venous 1.3  0.5 - 2.2 (mmol/L)  POCT I-STAT  3, BLOOD GAS (G3+)      Component Value Range   pH, Arterial 7.355  7.350 - 7.450    pCO2 arterial 28.7 (*) 35.0 - 45.0 (mmHg)   pO2, Arterial 102.0 (*) 80.0 - 100.0 (mmHg)   Bicarbonate 15.9 (*) 20.0 - 24.0 (  mEq/L)   TCO2 17  0 - 100 (mmol/L)   O2 Saturation 97.0     Acid-base deficit 8.0 (*) 0.0 - 2.0 (mmol/L)   Patient temperature 100.0 F     Collection site RADIAL, ALLEN'S TEST ACCEPTABLE     Sample type ARTERIAL    APTT      Component Value Range   aPTT 30  24 - 37 (seconds)  PROTIME-INR      Component Value Range   Prothrombin Time 17.9 (*) 11.6 - 15.2 (seconds)   INR 1.45  0.00 - 1.49   GLUCOSE, CAPILLARY      Component Value Range   Glucose-Capillary 134 (*) 70 - 99 (mg/dL)      ALLRED,D Lennette Bihari

## 2011-01-26 NOTE — Consult Note (Signed)
Name: JAMONE MEDDINGS MRN: [id number] DOB: Jun 22, 1932    LOS: 4  PCCM ADMISSION NOTE  History of Present Illness: 77 yo male who presented on 01/26/11 with CC: abd pain. Extensive pmh that includes cad, divticular bleed, colectomy. Ct abd with GB thickening and on 12/15 he became confused , tachycardic, and was Tx to sdu. PCCM asked to evaluate and assist inhis care. Mild lethargy but awake.  Remains with amio drip tachy afib rvr.  abdo pain increased. No vomiting  Lines / Drains: 12/15 lt i j cvl>> 12/15 planned GB drain STAT>>  Cultures: 12/13 uc>>neg 12/11 bc x 2>> 12/15 gallbaldder>>>  Antibiotics: 12/15 diflucan>> 12/15 vanc>> 12/15 zoysn>>  Tests / Events: 12/14 ct abd: . Dilated gallbladder with thickened gallbladder wall. No definite gallstones. These findings are highly suspicious for acute cholecystitis. Correlate clinically. 2. Right lower lobe atelectasis and possible pneumonia with small right effusion. 3. Perihepatic fluid. 4. Small bowel distention may reflect ileus or partial small bowel obstruction to the point of anastomosis with the remaining segment of colon in the pelvis.     Past Medical History  Diagnosis Date  . Previous back surgery 1997  . Hypertension   . Hemorrhoids   . Chronic kidney disease   . Kidney stones   . Dyslipidemia   . Diverticulosis   . Colon polyps   . Angina   . Heart attack 07/2009  . Blood transfusion   . Anemia   . GI bleeding after 07/2009    "triggered by Plavix & ASA; from my diverticulosis"  . GERD (gastroesophageal reflux disease)   . Headache     occasionally  . Anxiety   . Arthritis     "hips and lower back"  . Chronic back pain greater than 3 months duration    Past Surgical History  Procedure Date  . Eye surgery 1942  . Back surgery 1997  . Colon surgery     COLONOSCOPY  . Subtotal colectomy 01/24/2010   Prior to Admission medications   Medication Sig Start Date End Date Taking? Authorizing  Provider  acetaminophen (TYLENOL) 500 MG tablet Take 500 mg by mouth every 6 (six) hours as needed.     Yes Historical Provider, MD  aspirin 81 MG tablet Take 81 mg by mouth daily.     Yes Historical Provider, MD  citalopram (CELEXA) 20 MG tablet Take 20 mg by mouth daily.     Yes Historical Provider, MD  DOXAZOSIN MESYLATE PO Take 8 mg by mouth daily.     Yes Historical Provider, MD  fexofenadine (ALLEGRA) 180 MG tablet Take 180 mg by mouth daily.     Yes Historical Provider, MD  fluticasone (FLONASE) 50 MCG/ACT nasal spray 2 sprays by Nasal route daily.     Yes Historical Provider, MD  gabapentin (NEURONTIN) 400 MG capsule Take 400 mg by mouth 3 (three) times daily.     Yes Historical Provider, MD  HYDROcodone-acetaminophen (NORCO) 5-325 MG per tablet Take 1 tablet by mouth every 6 (six) hours as needed. For pain   Yes Historical Provider, MD  lisinopril (PRINIVIL,ZESTRIL) 10 MG tablet Take 10 mg by mouth daily.     Yes Historical Provider, MD  nebivolol (BYSTOLIC) 5 MG tablet Take 5 mg by mouth daily.     Yes Historical Provider, MD  omeprazole (PRILOSEC) 20 MG capsule Take 1 capsule (20 mg total) by mouth daily. 08/07/10 08/07/11 Yes Willia Craze, NP  ondansetron (ZOFRAN ODT) 4 MG disintegrating  tablet Take 1 tablet (4 mg total) by mouth every 8 (eight) hours as needed for nausea. 01/20/11 01/27/11 Yes Ruthell Rummage Dammen, PA  simvastatin (ZOCOR) 40 MG tablet Take 40 mg by mouth at bedtime.     Yes Historical Provider, MD  testosterone (ANDROGEL) 50 MG/5GM GEL Place 5 g onto the skin daily.     Yes Historical Provider, MD  traMADol-acetaminophen (ULTRACET) 37.5-325 MG per tablet Take 1 tablet by mouth every 6 (six) hours as needed.     Yes Historical Provider, MD  nitroGLYCERIN (NITROSTAT) 0.4 MG SL tablet Place 0.4 mg under the tongue every 5 (five) minutes as needed.      Historical Provider, MD   Allergies Allergies  Allergen Reactions  . Clopidogrel Bisulfate Other (See Comments)    H/O  diverticulitis; prone to rectal bleeding.  Plavix complicated this.  jkl  . Sulfonamide Derivatives Hives  . Naproxen Rash    Family History Family History  Problem Relation Age of Onset  . Heart disease Father   . Colon cancer Neg Hx   . Pancreatic cancer Mother     Social History  reports that he has never smoked. He has never used smokeless tobacco. He reports that he drinks alcohol. He reports that he does not use illicit drugs.  Review Of Systems  11 points review of systems is negative with an exception of listed in HPI.  Vital Signs: Temp:  [97.5 F (36.4 C)-101.7 F (38.7 C)] 100 F (37.8 C) (12/15 0825) Pulse Rate:  [67-180] 107  (12/15 0800) Resp:  [25-32] 31  (12/15 0800) BP: (99-161)/(66-89) 145/89 mmHg (12/15 0800) SpO2:  [93 %-97 %] 97 % (12/15 0800) Weight:  [196 lb 3.4 oz (89 kg)-196 lb 6.9 oz (89.1 kg)] 196 lb 6.9 oz (89.1 kg) (12/15 0545) I/O last 3 completed shifts: In: 1574.6 [I.V.:1399.6; NG/GT:175] Out: 2085 [Urine:1335; Emesis/NG output:750]  Physical Examination: General:  na Neuro:  Lethargic but orientated   HEENT:  No jvd Neck:  Lt i j cvl   Cardiovascular:  hsir ir Lungs:  cta Abdomen:  Tender diffuse, bs hypo , no r/g Musculoskeletal:  warm Skin:  Rash on trunck  Ventilator settings:  na Labs and Imaging:   CBC    Component Value Date/Time   WBC 5.9 01/25/2011 0645   RBC 5.02 01/25/2011 0645   HGB 15.1 01/25/2011 0645   HCT 45.8 01/25/2011 0645   PLT 183 01/25/2011 0645   MCV 91.2 01/25/2011 0645   MCH 30.1 01/25/2011 0645   MCHC 33.0 01/25/2011 0645   RDW 14.3 01/25/2011 0645   LYMPHSABS 0.5* 01/24/2011 0638   MONOABS 0.6 01/24/2011 0638   EOSABS 0.0 01/24/2011 0638   BASOSABS 0.0 01/24/2011 0638    BMET    Component Value Date/Time   NA 143 01/25/2011 0645   K 4.5 01/25/2011 0645   CL 111 01/25/2011 0645   CO2 17* 01/25/2011 0645   GLUCOSE 143* 01/25/2011 0645   BUN 51* 01/25/2011 0645   CREATININE 1.52*  01/25/2011 0645   CALCIUM 8.9 01/25/2011 0645   GFRNONAA 43* 01/25/2011 0645   GFRAA 50* 01/25/2011 0645      Assessment and Plan: Sever sepsis / afib rvr -amio as noted, bolus, give volume, assess cvp -place cvl for fluid management -rebolus amio 150 mg x 1 12/15 0930 Lactic acid STAT, if greater 4 will start EGDT regardless of BP May need addition cardizem for rate control  GB cholecystitis for perq drain 12/15  in IR. May need OTT/MVS -places drain STAT, surgery  has contacted See ID Npo lft ppi  HXof CAD: -tx to ICU See severe sepsis  Cholecystitis Add STAT diflucan ( thickened bgallbladder) Source control stat drain Appreciate surgery recs Add stat vanc, zosyn Dc invanze BC Drain fluid send  Mild resp failure Appears toxic, alert abg reviewed May require intubation Follow closely post drain for sirs response  Best practices / Disposition: -->ICU status under PCCM -->full code -->Heparin for DVT Px, if coags wnl -->Protonix for GI  Lavon Paganini. Titus Mould, MD, [place] Pgr: [facility]  I have fully examined this patient and agree with above findings.         Richardson Landry Minor ACNP Maryanna Shape PCCM Pager [phone] till 3 pm If no answer page [phone] 01/26/2011, 9:15 AM

## 2011-01-26 NOTE — Progress Notes (Signed)
Subjective: Patient moved to step down with increasing SOB, tachy, increased abdominal pain  Objective: Vital signs in last 24 hours: Temp:  [97.5 F (36.4 C)-101.7 F (38.7 C)] 100 F (37.8 C) (12/15 0825) Pulse Rate:  [67-180] 107  (12/15 0800) Resp:  [25-32] 31  (12/15 0800) BP: (99-161)/(66-89) 145/89 mmHg (12/15 0800) SpO2:  [93 %-97 %] 97 % (12/15 0800) Weight:  [196 lb 3.4 oz (89 kg)-196 lb 6.9 oz (89.1 kg)] 196 lb 6.9 oz (89.1 kg) (12/15 0545) Last BM Date: 01/22/11  Intake/Output from previous day: 12/14 0701 - 12/15 0700 In: 1574.6 [I.V.:1399.6; NG/GT:175] Out: 1585 [Urine:835; Emesis/NG output:750] Intake/Output this shift:    General appearance: moderate distress GI: abnormal findings:  distended and guarding very tender in RUQ  Lab Results:   Basename 01/25/11 0645 01/24/11 0638  WBC 5.9 14.7*  HGB 15.1 13.5  HCT 45.8 40.7  PLT 183 151   BMET  Basename 01/25/11 0645 01/24/11 0638  NA 143 141  K 4.5 3.9  CL 111 109  CO2 17* 21  GLUCOSE 143* 90  BUN 51* 51*  CREATININE 1.52* 1.88*  CALCIUM 8.9 8.5   PT/INR No results found for this basename: LABPROT:2,INR:2 in the last 72 hours ABG No results found for this basename: PHART:2,PCO2:2,PO2:2,HCO3:2 in the last 72 hours  Studies/Results: Ct Abdomen Pelvis W Contrast  01/25/2011  *RADIOLOGY REPORT*  Clinical Data: Abdominal distention, history of prior total colectomy, evaluate for small bowel obstruction  CT ABDOMEN AND PELVIS WITH CONTRAST  Technique:  Multidetector CT imaging of the abdomen and pelvis was performed following the standard protocol during bolus administration of intravenous contrast.  Contrast: 41mL OMNIPAQUE IOHEXOL 300 MG/ML IV SOLN  Comparison: Abdomen films from 01/25/2011  Findings: There is atelectasis of the right lower lobe with probable small effusion.  This may represent pneumonia or central endobronchial lesion.  Coronary artery calcifications are noted as well and there is  cardiomegaly present.  There is a small amount of perihepatic fluid present.  The liver has a normal appearance with no focal abnormality.  However the gallbladder is abnormal being distended and very thick-walled. Although no definite gallstones are seen, acute cholecystitis is the primary consideration.  The pancreas appears fatty replaced. The adrenal glands and spleen are unremarkable.  The stomach is filled with oral contrast and is unremarkable.  The kidneys enhance with no calculus or mass and no hydronephrosis is seen.  The abdominal aorta is normal in caliber with moderate atheromatous change present.  As noted on plain film there is some gaseous distention of small bowel in this patient with a history of colectomy.  The small bowel appears slightly prominent to the point of anastomosis in the mid abdomen with the remaining segment of rectosigmoid colon.  This small bowel distention may reflect ileus versus partial small bowel obstruction with stricture at the anastomotic site.  There is fluid within the remaining segment of colon.  Hardware for fusion of the lower lumbar spine is noted.  IMPRESSION:  1.  Dilated gallbladder with thickened gallbladder wall.  No definite gallstones.  These findings are highly suspicious for acute cholecystitis.  Correlate clinically. 2.  Right lower lobe atelectasis and possible pneumonia with small right effusion. 3.  Perihepatic fluid. 4.  Small bowel distention may reflect ileus or partial small bowel obstruction to the point of anastomosis with the remaining segment of colon in the pelvis.  Original Report Authenticated By: Joretta Bachelor, M.D.   Dg Abd Acute  W/chest  01/25/2011  *RADIOLOGY REPORT*  Clinical Data: Abdominal distention.  Prior total colectomy.  ACUTE ABDOMEN SERIES (ABDOMEN 2 VIEW & CHEST 1 VIEW)  Comparison: 01/24/2011  Findings: The very low lung volumes are present bilaterally. Nasogastric tube tip projects over the stomach body.  There is likely  atelectasis in both lower lobes.  The left side down lateral decubitus view of the abdomen demonstrates air fluid levels and scattered loops of dilated small bowel.  On supine imaging, the small bowel measures up to 6.2 cm in diameter, increased from previous.  No significant colonic stool or gas is observed.  IMPRESSION:  1.  Increasing distention of small bowel loops.  Although small bowel obstruction is favored based on the radiographic appearance, ileus is not totally excluded.  Original Report Authenticated By: Carron Curie, M.D.    Anti-infectives: Anti-infectives     Start     Dose/Rate Route Frequency Ordered Stop   01/26/11 1200   piperacillin-tazobactam (ZOSYN) IVPB 4.5 g        4.5 g 200 mL/hr over 30 Minutes Intravenous 4 times per day 01/26/11 0850     01/26/11 0900   fluconazole (DIFLUCAN) IVPB 800 mg        800 mg 400 mL/hr over 60 Minutes Intravenous Every 24 hours 01/26/11 0850     01/26/11 0900   vancomycin (VANCOCIN) IVPB 1000 mg/200 mL premix     Comments: STAT      1,000 mg 200 mL/hr over 60 Minutes Intravenous Every 12 hours 01/26/11 0850     01/25/11 1030   ertapenem (INVANZ) 1 g in sodium chloride 0.9 % 50 mL IVPB  Status:  Discontinued        1 g 100 mL/hr over 30 Minutes Intravenous Every 24 hours 01/25/11 0952 01/26/11 0850   01/24/11 1000   azithromycin (ZITHROMAX) 500 mg in dextrose 5 % 250 mL IVPB  Status:  Discontinued        500 mg 250 mL/hr over 60 Minutes Intravenous Every 24 hours 01/24/11 0840 01/24/11 0945   01/24/11 0930   cefTRIAXone (ROCEPHIN) 1 g in dextrose 5 % 50 mL IVPB  Status:  Discontinued        1 g 100 mL/hr over 30 Minutes Intravenous Every 24 hours 01/24/11 0840 01/24/11 0945          Assessment/Plan:  Discussed with CCM.  Given condition, will have IR perform percutaneous cholecystostomy.  If fails, will need cholecystectomy.  LOS: 4 days    Ejay Lashley A 01/26/2011

## 2011-01-26 NOTE — Progress Notes (Signed)
DAILY PROGRESS NOTE                              [facility]AL [facility]  SUBJECTIVE:  Patient transferred to step down overnight. Overall patient got much worse. Patient now is tachycardic with heart rate around 150, breathing in the 30s all the time. Blood pressure still stable. CT scan showing acute cholecystitis and right-sided atelectasis versus infiltrates  OBJECTIVE: BP 143/73  Pulse 67  Temp(Src) 98.4 F (36.9 C) (Oral)  Resp 27  Ht 5\' 8"  (1.727 m)  Wt 89.1 kg (196 lb 6.9 oz)  BMI 29.87 kg/m2  SpO2 97%  Intake/Output Summary (Last 24 hours) at 01/26/11 0803 Last data filed at 01/26/11 0700  Gross per 24 hour  Intake 1574.6 ml  Output   1585 ml  Net  -10.4 ml                      Weight change: -1.719 kg (-3 lb 12.7 oz) Physical Exam: General: Alert and awake oriented x3 mild to moderate distress. HEENT: anicteric sclera, pupils equal reactive to light and accommodation CVS: S1-S2 heard, no murmur rubs or gallops Chest: clear to auscultation bilaterally, no wheezing rales or rhonchi Abdomen:  Sluggish bowel sounds, distended abdomen and moderate to severe tenderness Neuro: Cranial nerves II-XII intact, no focal neurological deficits Extremities: no cyanosis, no clubbing or edema noted bilaterally   Lab Results:  [facility] 01/25/11 0645 01/24/11 0638  NA 143 141  K 4.5 3.9  CL 111 109  CO2 17* 21  GLUCOSE 143* 90  BUN 51* 51*  CREATININE 1.52* 1.88*  CALCIUM 8.9 8.5  MG -- --  PHOS -- --    Basename 01/25/11 0645  AST 27  ALT 20  ALKPHOS 61  BILITOT 0.6  PROT 6.2  ALBUMIN 2.5*    Basename 01/25/11 0645  LIPASE 10*  AMYLASE --    Basename 01/25/11 0645 01/24/11 0638  WBC 5.9 14.7*  NEUTROABS -- 13.6*  HGB 15.1 13.5  HCT 45.8 40.7  MCV 91.2 90.8  PLT 183 151   Studies/Results: Ct Abdomen Pelvis W Contrast 01/25/2011  *RADIOLOGY REPORT*   IMPRESSION:   1.  Dilated gallbladder with thickened gallbladder wall.  No  definite gallstones.  These findings are highly suspicious for acute cholecystitis.  Correlate clinically.  2.  Right lower lobe atelectasis and possible pneumonia with small right effusion.  3.  Perihepatic fluid.  4.  Small bowel distention may reflect ileus or partial small bowel obstruction to the point of anastomosis with the remaining segment of colon in the pelvis.    Dg Chest 2 View IMPRESSION: Infiltration or atelectasis in both lung bases, greater on the right, suggesting pneumonia.  Right upper quadrant suggest possible small bowel dilatation and air-fluid levels.  Obstruction should be excluded.    Ct Abdomen Pelvis W Contrast 01/20/2011  *RADIOLOGY REPORT*  IMPRESSION: Postoperative changes with some total colectomy and ileal sigmoid anastomoses.  Mild distension with fluid filled small bowel loops extending to the anus.  Although anal obstruction is not excluded, changes more likely represent ileus.  No significant wall thickening.    Dg Abd Acute W/chest 01/22/2011  *RADIOLOGY REPORT*  IMPRESSION: Findings suspicious for small bowel obstruction. Consider CT.  Medications: Scheduled Meds:    . amiodarone  150 mg Intravenous Once  . diphenhydrAMINE  25 mg Intravenous Once  . enoxaparin  40  mg Subcutaneous Q24H  . ertapenem  1 g Intravenous Q24H  . fluticasone  2 spray Each Nare Daily  . iohexol  20 mL Oral Q1 Hr x 2  . metoprolol      . metoprolol  5 mg Intravenous Once  . metoprolol  5 mg Intravenous Once  . pantoprazole (PROTONIX) IV  40 mg Intravenous Q12H  . DISCONTD: diltiazem  20 mg Intravenous Once  . DISCONTD: diphenhydrAMINE  25 mg Oral Once   Continuous Infusions:    . amiodarone (CORDARONE) infusion 60 mg/hr (01/26/11 0700)   And  . amiodarone (CORDARONE) infusion    . DISCONTD: sodium chloride 125 mL/hr at 01/25/11 1400  . DISCONTD: diltiazem (CARDIZEM) infusion     PRN Meds:.albuterol, hydrALAZINE, HYDROmorphone (DILAUDID) injection, iohexol,  promethazine, DISCONTD: LORazepam  ASSESSMENT & PLAN: Principal Problem:  *SBO (small bowel obstruction) Active Problems:  HYPERTENSION  CAD  Ileus  Dyslipidemia  1. Acute cholecystitis, likely a calculus: CT scan showing perihepatic fluid and thickening of the gallbladder consistent with cholecystitis. General surgery on board probably patient will need cholecystectomy at some point. Patient got in moderate distress, with tachycardia and increased work of breathing. Patient might probably getting septic. Patient on Colbert Ewing now, I called PCCM to take over.  2. SBO versus ileus: General Surgery is following. Clinically patient is doing worse. Has NG tube continue n.p.o. Gen. surgery also following.  3. Low grade Fever: Patient probably septic. He started on Invanz. No fever since yesterday. Patient in the ICU because of moderate distress and is getting worse. PCCM to take over, I spoke with [place].  3. Hypertension: Off of his medication. Because of NG tube no oral medicines. Blood pressure still holding up.  4. Dyslipidemia: Stable no changes will be done to his medication.     LOS: 4 days   Gary [name] A 01/26/2011, 8:03 AM

## 2011-01-26 NOTE — Progress Notes (Signed)
ANTIBIOTIC CONSULT NOTE - INITIAL  Pharmacy Consult for Zosyn, Vanc and Diflucan Indication: Empiric for fever & r/o intra-abdominal infection  Allergies  Allergen Reactions  . Clopidogrel Bisulfate Other (See Comments)    H/O diverticulitis; prone to rectal bleeding.  Plavix complicated this.  jkl  . Sulfonamide Derivatives Hives  . Naproxen Rash    Patient Measurements: Height: 5\' 8"  (172.7 cm) Weight: 196 lb 6.9 oz (89.1 kg) IBW/kg (Calculated) : 68.4   Vital Signs: Temp: 100 F (37.8 C) (12/15 0825) Temp src: Oral (12/15 0825) BP: 145/89 mmHg (12/15 0800) Pulse Rate: 107  (12/15 0800) Intake/Output from previous day: 12/14 0701 - 12/15 0700 In: 1574.6 [I.V.:1399.6; NG/GT:175] Out: 1585 [Urine:835; Emesis/NG output:750] Intake/Output from this shift:    Labs:  [facility] 01/25/11 0645 01/24/11 0638  WBC 5.9 14.7*  HGB 15.1 13.5  PLT 183 151  LABCREA -- --  CREATININE 1.52* 1.88*   Estimated Creatinine Clearance: 44.9 ml/min (by C-G formula based on Cr of 1.52). No results found for this basename: VANCOTROUGH:2,VANCOPEAK:2,VANCORANDOM:2,GENTTROUGH:2,GENTPEAK:2,GENTRANDOM:2,TOBRATROUGH:2,TOBRAPEAK:2,TOBRARND:2,AMIKACINPEAK:2,AMIKACINTROU:2,AMIKACIN:2, in the last 72 hours   Microbiology: Recent Results (from the past 720 hour(s))  CULTURE, BLOOD (ROUTINE X 2)     Status: Normal (Preliminary result)   Collection Time   01/22/11 10:07 PM      Component Value Range Status Comment   Specimen Description BLOOD RIGHT ARM   Final    Special Requests BOTTLES DRAWN AEROBIC AND ANAEROBIC 10CC   Final    Setup Time [id number]   Final    Culture     Final    Value:        BLOOD CULTURE RECEIVED NO GROWTH TO DATE CULTURE WILL BE HELD FOR 5 DAYS BEFORE ISSUING A FINAL NEGATIVE REPORT   Report Status PENDING   Incomplete   CULTURE, BLOOD (ROUTINE X 2)     Status: Normal (Preliminary result)   Collection Time   01/22/11 10:15 PM      Component Value Range Status Comment   Specimen Description BLOOD RIGHT HAND   Final    Special Requests BOTTLES DRAWN AEROBIC AND ANAEROBIC 10CC   Final    Setup Time [id number]   Final    Culture     Final    Value:        BLOOD CULTURE RECEIVED NO GROWTH TO DATE CULTURE WILL BE HELD FOR 5 DAYS BEFORE ISSUING A FINAL NEGATIVE REPORT   Report Status PENDING   Incomplete   URINE CULTURE     Status: Normal   Collection Time   01/23/11  7:00 AM      Component Value Range Status Comment   Specimen Description URINE, RANDOM   Final    Special Requests NONE   Final    Setup Time [id number]   Final    Colony Count NO GROWTH   Final    Culture NO GROWTH   Final    Report Status 01/24/2011 FINAL   Final   URINE CULTURE     Status: Normal   Collection Time   01/24/11  3:13 PM      Component Value Range Status Comment   Specimen Description URINE, CLEAN CATCH   Final    Special Requests NONE   Final    Setup Time [id number]   Final    Colony Count NO GROWTH   Final    Culture NO GROWTH   Final    Report Status 01/25/2011 FINAL   Final   MRSA PCR  SCREENING     Status: Normal   Collection Time   01/26/11  5:35 AM      Component Value Range Status Comment   MRSA by PCR NEGATIVE  NEGATIVE  Final     Medical History: Past Medical History  Diagnosis Date  . Previous back surgery 1997  . Hypertension   . Hemorrhoids   . Chronic kidney disease   . Kidney stones   . Dyslipidemia   . Diverticulosis   . Colon polyps   . Angina   . Heart attack 07/2009  . Blood transfusion   . Anemia   . GI bleeding after 07/2009    "triggered by Plavix & ASA; from my diverticulosis"  . GERD (gastroesophageal reflux disease)   . Headache     occasionally  . Anxiety   . Arthritis     "hips and lower back"  . Chronic back pain greater than 3 months duration     Medications:  Anti-infectives     Start     Dose/Rate Route Frequency Ordered Stop   01/26/11 1200  piperacillin-tazobactam (ZOSYN) IVPB 4.5 g       4.5 g 200 mL/hr  over 30 Minutes Intravenous 4 times per day 01/26/11 0850     01/26/11 0900   fluconazole (DIFLUCAN) IVPB 800 mg        800 mg 400 mL/hr over 60 Minutes Intravenous Every 24 hours 01/26/11 0850     01/26/11 0900   vancomycin (VANCOCIN) IVPB 1000 mg/200 mL premix     Comments: STAT      1,000 mg 200 mL/hr over 60 Minutes Intravenous Every 12 hours 01/26/11 0850     01/25/11 1030   ertapenem (INVANZ) 1 g in sodium chloride 0.9 % 50 mL IVPB  Status:  Discontinued        1 g 100 mL/hr over 30 Minutes Intravenous Every 24 hours 01/25/11 0952 01/26/11 0850   01/24/11 1000   azithromycin (ZITHROMAX) 500 mg in dextrose 5 % 250 mL IVPB  Status:  Discontinued        500 mg 250 mL/hr over 60 Minutes Intravenous Every 24 hours 01/24/11 0840 01/24/11 0945   01/24/11 0930   cefTRIAXone (ROCEPHIN) 1 g in dextrose 5 % 50 mL IVPB  Status:  Discontinued        1 g 100 mL/hr over 30 Minutes Intravenous Every 24 hours 01/24/11 0840 01/24/11 0945         Assessment: 77 year old male to start Vancomycin, Zosyn, and Diflucan per pharmacy for rule out intra-abdominal infection. WBC decreased at 5.9, Tm 101.7, MRSA PCR negative. Blood cultures are pending. CT Abd-suspicious for acute cholecystitis. No BMET today-estCrCl~34ml/min. UOP~0.4cc/kg/hr recorded.   Goal of Therapy:  Vancomycin trough level 15-20 mcg/ml  Plan:  1. Change Zosyn 3.375g IV q8h-start now. 2. Change Vancomycin 1500mg  IV x1, then Vancomycin 750mg  IV q12h.  3. Diflucan 800mg  IV x1, then 400mg  IV daily. 4. Will follow-up renal function closely and adjust antibiotics as needed. 5. Will follow-up culture results.   Brain Hilts 01/26/2011,9:14 AM

## 2011-01-26 NOTE — Procedures (Signed)
Successful CT guided placement of a 10.2 Fr cholecystomy tube.  No immediate post procedural complications.

## 2011-01-27 ENCOUNTER — Inpatient Hospital Stay (HOSPITAL_COMMUNITY): Payer: Medicare Other

## 2011-01-27 DIAGNOSIS — A419 Sepsis, unspecified organism: Secondary | ICD-10-CM

## 2011-01-27 DIAGNOSIS — J9600 Acute respiratory failure, unspecified whether with hypoxia or hypercapnia: Secondary | ICD-10-CM

## 2011-01-27 DIAGNOSIS — K819 Cholecystitis, unspecified: Secondary | ICD-10-CM

## 2011-01-27 LAB — COMPREHENSIVE METABOLIC PANEL
ALT: 19 U/L (ref 0–53)
ALT: 18 U/L (ref 0–53)
AST: 32 U/L (ref 0–37)
Alkaline Phosphatase: 52 U/L (ref 39–117)
Alkaline Phosphatase: 56 U/L (ref 39–117)
BUN: 51 mg/dL — ABNORMAL HIGH (ref 6–23)
CO2: 19 mEq/L (ref 19–32)
CO2: 22 mEq/L (ref 19–32)
Calcium: 8.7 mg/dL (ref 8.4–10.5)
Chloride: 122 mEq/L — ABNORMAL HIGH (ref 96–112)
GFR calc Af Amer: 50 mL/min — ABNORMAL LOW (ref >90)
GFR calc non Af Amer: 45 mL/min — ABNORMAL LOW (ref >90)
GFR calc non Af Amer: 43 mL/min — ABNORMAL LOW (ref >90)
Glucose, Bld: 138 mg/dL — ABNORMAL HIGH (ref 70–99)
Glucose, Bld: 121 mg/dL — ABNORMAL HIGH (ref 70–99)
Potassium: 3.5 mEq/L (ref 3.5–5.1)
Potassium: 3.4 mEq/L — ABNORMAL LOW (ref 3.5–5.1)
Sodium: 149 mEq/L — ABNORMAL HIGH (ref 135–145)
Sodium: 150 mEq/L — ABNORMAL HIGH (ref 135–145)
Total Bilirubin: 0.7 mg/dL (ref 0.3–1.2)
Total Protein: 5.4 g/dL — ABNORMAL LOW (ref 6.0–8.3)

## 2011-01-27 LAB — CBC
Hemoglobin: 13 g/dL (ref 13.0–17.0)
MCH: 30.1 pg (ref 26.0–34.0)
Platelets: 200 10*3/uL (ref 150–400)
RBC: 4.32 MIL/uL (ref 4.22–5.81)

## 2011-01-27 LAB — DIFFERENTIAL
Eosinophils Absolute: 0.1 10*3/uL (ref 0.0–0.7)
Eosinophils Relative: 1 % (ref 0–5)
Lymphocytes Relative: 4 % — ABNORMAL LOW (ref 12–46)
Lymphs Abs: 0.5 10*3/uL — ABNORMAL LOW (ref 0.7–4.0)
Monocytes Absolute: 0.7 10*3/uL (ref 0.1–1.0)

## 2011-01-27 MED ORDER — AMIODARONE HCL 200 MG PO TABS
200.0000 mg | ORAL_TABLET | Freq: Every day | ORAL | Status: DC
  Administered 2011-01-27: 200 mg via ORAL
  Filled 2011-01-27 (×2): qty 1

## 2011-01-27 MED ORDER — POTASSIUM CHLORIDE 10 MEQ/50ML IV SOLN
10.0000 meq | INTRAVENOUS | Status: AC
  Administered 2011-01-27 (×2): 10 meq via INTRAVENOUS
  Filled 2011-01-27 (×2): qty 50

## 2011-01-27 MED ORDER — SODIUM CHLORIDE 0.45 % IV SOLN
INTRAVENOUS | Status: DC
  Administered 2011-01-27 – 2011-01-28 (×2): via INTRAVENOUS

## 2011-01-27 MED ORDER — SODIUM CHLORIDE 0.9 % IJ SOLN
INTRAMUSCULAR | Status: AC
  Administered 2011-01-27: 5 mL
  Filled 2011-01-27: qty 10

## 2011-01-27 NOTE — Progress Notes (Signed)
Converting amiodarone to PO

## 2011-01-27 NOTE — Progress Notes (Signed)
Patient ID: Gary [name], male   DOB: 1932-03-04, 77 y.o.   MRN: [id number]    Subjective: Pt doing slightly better since transfer to ICU.  Perc chole drain placed.    Objective: Vital signs in last 24 hours: Temp:  [97.5 F (36.4 C)-101.7 F (38.7 C)] 99.5 F (37.5 C) (12/16 0800) Pulse Rate:  [70-141] 70  (12/16 0800) Resp:  [19-35] 22  (12/16 0800) BP: (108-170)/(55-98) 145/67 mmHg (12/16 0800) SpO2:  [93 %-98 %] 97 % (12/16 0800) Weight:  [198 lb 6.6 oz (90 kg)] 198 lb 6.6 oz (90 kg) (12/16 0500) Last BM Date: 01/26/11  Intake/Output from previous day: 12/15 0701 - 12/16 0700 In: 2086.4 [I.V.:808.9; NG/GT:90; IV Piggyback:1187.5] Out: 2565 [Urine:1735; Emesis/NG output:300; Drains:180; Stool:350] Intake/Output this shift: Total I/O In: 39.2 [I.V.:26.7; IV Piggyback:12.5] Out: 150 [Urine:150]  Exam: Alert, mild distress Lungs, clear to ausculation CV:  Reg rate and rhythm Abd:  Soft, mild tenderness in RUQ Drain non bilious at this time.    Lab Results:   [facility] 01/27/11 0455 01/25/11 0645  WBC 13.0* 5.9  HGB 13.0 15.1  HCT 38.1* 45.8  PLT 200 183   BMET  Basename 01/27/11 0455 01/26/11 0858  NA 149* 147*  K 3.5 3.7  CL 121* 118*  CO2 19 19  GLUCOSE 138* 160*  BUN 53* 50*  CREATININE 1.45* 1.43*  CALCIUM 8.7 9.0   PT/INR  Basename 01/27/11 0455 01/26/11 0858  LABPROT 18.9* 17.9*  INR 1.55* 1.45   ABG  Basename 01/26/11 0854  PHART 7.355  HCO3 15.9*    Studies/Results: Ct Guided Abscess Drain  01/26/2011  *RADIOLOGY REPORT*  Indication: Acute cholecystitis, poor surgical candidate  CT GUIDED CHOLECYSTOMY TUBE PLACEMENT  Comparison: CT of the abdomen and pelvis - 01/25/2011  Medications: Conscious sedation was achieved intravenous Fentanyl and Versed.  The patient is admitted to the hospital and currently on intravenous antibiotics.  Total Moderate Sedation time: 40 minutes  Contrast: None  Complications: None immediate  Technique / Findings:   Informed written consent was obtained from the patient's white after a discussion of the risks, benefits and alternatives to treatment.  Review of the preprocedural CT demonstrates multiple loops of interposed small bowel nearly surrounding the gallbladder fossa.  As such, the decision was made to perform the procedure with CT guidance.  The patient was placed supine, slightly left lateral decubitus on the CT gantry and a pre procedural CT was performed demonstrating a moderate amount of perihepatic fluid within distended, thick-walled gallbladder.  The procedure was planned.   A timeout was performed prior to the initiation of the procedure.  The right lateral upper abdomen prepped and draped in the usual sterile fashion.   The overlying soft tissues were anesthetized with 1% lidocaine with epinephrine. Appropriate transhepatic trajectory was planned with a 22 gauge spinal needle.  Ultimately, the gallbladder fossa was accessed with an Accustick needle with a combination of intermittent CT fluoroscopic guidance and ultimately intermittent CT images.  An 0.018 wire was coiled within the gallbladder fossa and appropriate positioning was confirmed with a limited CT.  The track was dilated with the Accustick set, ultimately allowing placement of a 10.2 [place] all-purpose drainage catheter into the gallbladder over a short Amplatz stiff wire.  A postprocedural scan was performed demonstrating appropriate positioning within the gallbladder and was negative for complication.  The catheter was connected to a drainage bag, secured in place with suture and a dressing was placed. Approximately 20 ml  of bile was aspirated and sent to the lab.  The patient tolerated the procedure well without immediate postprocedural complication.  Impression:  Successful CT guided placement of a 10.2 [place] all purpose drain into the gallbladder.  Samples were sent to the laboratory as requested by the ordering clinical team.  Original Report  Authenticated By: Rachel Moulds, M.D.   Dg Chest Port 1 View  01/27/2011  *RADIOLOGY REPORT*  Clinical Data: Productive cough, evaluate for pneumonia  PORTABLE CHEST - 1 VIEW  Comparison: 01/26/2011; 01/24/2011; 01/22/2011; 11/27/2009  Findings: Unchanged enlarged cardiac silhouette and mediastinal contours.  Stable position of support apparatus.  There is persistent elevation of the right hemidiaphragm with blunting of the right costophrenic angles and right basilar heterogeneous opacities.  Minimal left basilar heterogeneous opacities.  No pneumothorax.  Cholecystostomy tube overlies the right upper abdominal quadrant.  Unchanged bones.  IMPRESSION: 1.  Persistent elevation of the right hemidiaphragm with small right-sided effusion and right basilar opacities, possibly atelectasis though underlying infection is not excluded. 2. Minimal left basilar heterogeneous opacities, possibly atelectasis  Original Report Authenticated By: Rachel Moulds, M.D.    Anti-infectives: Anti-infectives     Start     Dose/Rate Route Frequency Ordered Stop   01/27/11 1200   fluconazole (DIFLUCAN) IVPB 400 mg        400 mg 200 mL/hr over 60 Minutes Intravenous Every 24 hours 01/26/11 0940     01/26/11 2200   vancomycin (VANCOCIN) 750 mg in sodium chloride 0.9 % 150 mL IVPB        750 mg 150 mL/hr over 60 Minutes Intravenous Every 12 hours 01/26/11 0934     01/26/11 1200   piperacillin-tazobactam (ZOSYN) IVPB 4.5 g  Status:  Discontinued        4.5 g 200 mL/hr over 30 Minutes Intravenous 4 times per day 01/26/11 0850 01/26/11 0929   01/26/11 1100   fluconazole (DIFLUCAN) IVPB 800 mg        800 mg 400 mL/hr over 60 Minutes Intravenous  Once 01/26/11 0938 01/26/11 1200   01/26/11 1000   vancomycin (VANCOCIN) 1,500 mg in sodium chloride 0.9 % 500 mL IVPB        1,500 mg 250 mL/hr over 120 Minutes Intravenous  Once 01/26/11 0933 01/26/11 1521   01/26/11 0930   piperacillin-tazobactam (ZOSYN) IVPB 3.375 g          3.375 g 12.5 mL/hr over 240 Minutes Intravenous 3 times per day 01/26/11 0929     01/26/11 0900   fluconazole (DIFLUCAN) IVPB 800 mg  Status:  Discontinued        800 mg 400 mL/hr over 60 Minutes Intravenous Every 24 hours 01/26/11 0850 01/26/11 0935   01/26/11 0900   vancomycin (VANCOCIN) IVPB 1000 mg/200 mL premix  Status:  Discontinued     Comments: STAT      1,000 mg 200 mL/hr over 60 Minutes Intravenous Every 12 hours 01/26/11 0850 01/26/11 0932   01/25/11 1030   ertapenem (INVANZ) 1 g in sodium chloride 0.9 % 50 mL IVPB  Status:  Discontinued        1 g 100 mL/hr over 30 Minutes Intravenous Every 24 hours 01/25/11 0952 01/26/11 0850   01/24/11 1000   azithromycin (ZITHROMAX) 500 mg in dextrose 5 % 250 mL IVPB  Status:  Discontinued        500 mg 250 mL/hr over 60 Minutes Intravenous Every 24 hours 01/24/11 0840 01/24/11 0945  01/24/11 0930   cefTRIAXone (ROCEPHIN) 1 g in dextrose 5 % 50 mL IVPB  Status:  Discontinued        1 g 100 mL/hr over 30 Minutes Intravenous Every 24 hours 01/24/11 0840 01/24/11 0945          Assessment/Plan:  Perc chole tube placed.  Doing a little better.     LOS: 5 days    [facility] 01/27/2011

## 2011-01-27 NOTE — Progress Notes (Signed)
Follow up - Critical Care Medicine Note  Patient Details:    Gary [name] is an 77 y.o. male with PMH of CAD, diveritcular bleed, and colectomy.  CT of abdomen with gallbladder thickening and on 12/15 he became confused, tachycardic, and was transfer to SDU.  PCCM aked to evaluate and assist in his care.   Lines, Airways, Drains: 12/15 Left IJ CVL >>> 12/15 Biliary Drainage Tube >>> 12/15 NG/OG tube >>> 12/15 Rectal pouch >>> 12/15 Foley Catheter >>>  Anti-infectives:  12/16 Fluconazole >>> 12/15 Vancomycin >>> 12/15 Zosyn >>>  Other Medications/IVF: 12/15 Amiodarone >>> 12/15 NS @20mL /hr >>>  Microbiology: 12/11 Blood Culture x2 >>> No growth 12/13 Urine Culture >>> Negative 12/15 MRSA Screening >>> Negative 12/15 Abscess Culture >>> Pending  Best Practice/Protocols:  PE/DVT: Lovenox GI:  Protonix  Events/Test: 12/14 CT abdomen with dilated gallbladder with thickened wall.  No definite gallstone.  Suggestive of acute cholecysitis  RLL atelectasis and possible pneumonia with small right effusion  Perihepatic fluid  Small bowel distention may reflect ileus or partial SBO to the point of anatomosis with remaining segment of colon  in pelvis 12/15 Biliary tube placement for drainage and acute cholecystitis 12/15 Transfer to PCCM for further care and Left IJ CVP placement, severe sepsis  Studies:  Dg Chest 2 View 01/23/2011  *RADIOLOGY REPORT*  Clinical Data: Fever and upper abdominal pain  CHEST - 2 VIEW  Comparison: 06/27/2010  Findings: Shallow inspiration.  Infiltration or atelectasis in both lung bases, greater on the right.  This is new since the previous study may represent pneumonia.  Borderline heart size and pulmonary vascularity.  Calcified granulomas in the upper lungs.  The upper abdomen demonstrates bowel interposition in front of the liver and under the right hemidiaphragm.  Suggestion of mild gaseous distension of small bowel loops with air-fluid levels.   Small bowel obstruction should be considered.  Abdominal films likely would be useful in further evaluation.  IMPRESSION: Infiltration or atelectasis in both lung bases, greater on the right, suggesting pneumonia.  Right upper quadrant suggest possible small bowel dilatation and air-fluid levels.  Obstruction should be excluded.  Original Report Authenticated By: Neale Burly, M.D.   Ct Guided Abscess Drain 01/26/2011  *RADIOLOGY REPORT*  Indication: Acute cholecystitis, poor surgical candidate  CT GUIDED CHOLECYSTOMY TUBE PLACEMENT  Comparison: CT of the abdomen and pelvis - 01/25/2011  Medications: Conscious sedation was achieved intravenous Fentanyl and Versed.  The patient is admitted to the hospital and currently on intravenous antibiotics.  Total Moderate Sedation time: 40 minutes  Contrast: None  Complications: None immediate  Technique / Findings:  Informed written consent was obtained from the patient's white after a discussion of the risks, benefits and alternatives to treatment.  Review of the preprocedural CT demonstrates multiple loops of interposed small bowel nearly surrounding the gallbladder fossa.  As such, the decision was made to perform the procedure with CT guidance.  The patient was placed supine, slightly left lateral decubitus on the CT gantry and a pre procedural CT was performed demonstrating a moderate amount of perihepatic fluid within distended, thick-walled gallbladder.  The procedure was planned.   A timeout was performed prior to the initiation of the procedure.  The right lateral upper abdomen prepped and draped in the usual sterile fashion.   The overlying soft tissues were anesthetized with 1% lidocaine with epinephrine. Appropriate transhepatic trajectory was planned with a 22 gauge spinal needle.  Ultimately, the gallbladder fossa was accessed with an  Accustick needle with a combination of intermittent CT fluoroscopic guidance and ultimately intermittent CT images.  An  0.018 wire was coiled within the gallbladder fossa and appropriate positioning was confirmed with a limited CT.  The track was dilated with the Accustick set, ultimately allowing placement of a 10.2 [place] all-purpose drainage catheter into the gallbladder over a short Amplatz stiff wire.  A postprocedural scan was performed demonstrating appropriate positioning within the gallbladder and was negative for complication.  The catheter was connected to a drainage bag, secured in place with suture and a dressing was placed. Approximately 20 ml of bile was aspirated and sent to the lab.  The patient tolerated the procedure well without immediate postprocedural complication.  Impression:  Successful CT guided placement of a 10.2 [place] all purpose drain into the gallbladder.  Samples were sent to the laboratory as requested by the ordering clinical team.  Original Report Authenticated By: Rachel Moulds, M.D.   Ct Abdomen Pelvis W Contrast 01/25/2011  *RADIOLOGY REPORT*  Clinical Data: Abdominal distention, history of prior total colectomy, evaluate for small bowel obstruction  CT ABDOMEN AND PELVIS WITH CONTRAST  Technique:  Multidetector CT imaging of the abdomen and pelvis was performed following the standard protocol during bolus administration of intravenous contrast.  Contrast: 68mL OMNIPAQUE IOHEXOL 300 MG/ML IV SOLN  Comparison: Abdomen films from 01/25/2011  Findings: There is atelectasis of the right lower lobe with probable small effusion.  This may represent pneumonia or central endobronchial lesion.  Coronary artery calcifications are noted as well and there is cardiomegaly present.  There is a small amount of perihepatic fluid present.  The liver has a normal appearance with no focal abnormality.  However the gallbladder is abnormal being distended and very thick-walled. Although no definite gallstones are seen, acute cholecystitis is the primary consideration.  The pancreas appears fatty replaced. The  adrenal glands and spleen are unremarkable.  The stomach is filled with oral contrast and is unremarkable.  The kidneys enhance with no calculus or mass and no hydronephrosis is seen.  The abdominal aorta is normal in caliber with moderate atheromatous change present.  As noted on plain film there is some gaseous distention of small bowel in this patient with a history of colectomy.  The small bowel appears slightly prominent to the point of anastomosis in the mid abdomen with the remaining segment of rectosigmoid colon.  This small bowel distention may reflect ileus versus partial small bowel obstruction with stricture at the anastomotic site.  There is fluid within the remaining segment of colon.  Hardware for fusion of the lower lumbar spine is noted.  IMPRESSION:  1.  Dilated gallbladder with thickened gallbladder wall.  No definite gallstones.  These findings are highly suspicious for acute cholecystitis.  Correlate clinically. 2.  Right lower lobe atelectasis and possible pneumonia with small right effusion. 3.  Perihepatic fluid. 4.  Small bowel distention may reflect ileus or partial small bowel obstruction to the point of anastomosis with the remaining segment of colon in the pelvis.  Original Report Authenticated By: Joretta Bachelor, M.D.   Ct Abdomen Pelvis W Contrast 01/20/2011  *RADIOLOGY REPORT*  Clinical Data: Abdominal pain, nausea, vomiting, diarrhea.  History of colectomy.  CT ABDOMEN AND PELVIS WITH CONTRAST  Technique:  Multidetector CT imaging of the abdomen and pelvis was performed following the standard protocol during bolus administration of intravenous contrast.  Contrast: 161mL OMNIPAQUE IOHEXOL 300 MG/ML IV SOLN  Comparison: 01/19/2010  Findings: Infiltration or  atelectasis in the right lung base with dependent atelectasis in the left lung base.  Calcified granulomas in the spleen.  The liver, gallbladder, pancreas, adrenal glands, kidneys, and retroperitoneal lymph nodes are unremarkable.  Vascular calcifications in the aorta without aneurysm.  Mild thickening of the wall of the distal esophagus possibly due to under distension or inflammatory esophagitis.  No free fluid or free air in the abdomen.  Prominent visceral adipose tissue.  Postoperative changes consistent with some total colectomy with ileal sigmoid anastomoses.  The  distal small bowel are mildly distended and filled with fluid and gas.  There is no wall thickening.  The anastomosis appears patent.  Changes appear to extend to the rectum, suggesting an ileus rather than obstruction. No obstructing lesion is visualized.  Pelvis:  Calcifications in the prostate gland.  No bladder wall thickening.  No free or loculated pelvic fluid collections.  No pelvic lymphadenopathy.  Postoperative changes and degenerative changes in the lumbar spine.  IMPRESSION: Postoperative changes with some total colectomy and ileal sigmoid anastomoses.  Mild distension with fluid filled small bowel loops extending to the anus.  Although anal obstruction is not excluded, changes more likely represent ileus.  No significant wall thickening.  Original Report Authenticated By: Neale Burly, M.D.   Dg Chest Port 1 View 01/27/2011  *RADIOLOGY REPORT*  Clinical Data: Productive cough, evaluate for pneumonia  PORTABLE CHEST - 1 VIEW  Comparison: 01/26/2011; 01/24/2011; 01/22/2011; 11/27/2009  Findings: Unchanged enlarged cardiac silhouette and mediastinal contours.  Stable position of support apparatus.  There is persistent elevation of the right hemidiaphragm with blunting of the right costophrenic angles and right basilar heterogeneous opacities.  Minimal left basilar heterogeneous opacities.  No pneumothorax.  Cholecystostomy tube overlies the right upper abdominal quadrant.  Unchanged bones.  IMPRESSION: 1.  Persistent elevation of the right hemidiaphragm with small right-sided effusion and right basilar opacities, possibly atelectasis though underlying  infection is not excluded. 2. Minimal left basilar heterogeneous opacities, possibly atelectasis  Original Report Authenticated By: Rachel Moulds, M.D.   Dg Chest Port 1 View 01/26/2011  *RADIOLOGY REPORT*  Clinical Data: Line placement.  PORTABLE CHEST - 1 VIEW  Comparison: 01/24/2011  Findings: Nasogastric tube is in place with tip overlying the level of the stomach.  Left IJ central line tip overlies the level of the superior vena cava.  The film is very shallow lung inflation.  There is elevation of the right hemidiaphragm as before.  There is no evidence for pneumothorax.  Heart is enlarged.  There is perihilar bronchitic change.  Pulmonary vascular congestion is present.  There is bibasilar atelectasis.  IMPRESSION:  1.  Interval placement of nasogastric tube and left IJ central line. 2.  No evidence for postprocedure pneumothorax.  Original Report Authenticated By: Glenice Bow, M.D.   Dg Chest Port 1 View 01/24/2011  *RADIOLOGY REPORT*  Clinical Data: Shortness of breath and wheezing.  Chills.  PORTABLE CHEST - 1 VIEW  Comparison: 01/22/2011  Findings: Shallow inspiration with elevation of the right hemidiaphragm.  Infiltration or atelectasis again demonstrated in both lung bases, similar to previous study.  No blunting of costophrenic angles.  No pneumothorax.  Gas filled dilated bowel loops again demonstrated in the right upper quadrant.  Small bowel obstruction is not excluded.  IMPRESSION: Stable appearance of the chest since the previous study. Infiltration or atelectasis again demonstrated in the lung bases. Gaseous distension of small bowel loops in the right upper quadrant.  Original Report Authenticated  By: Neale Burly, M.D.   Dg Abd Acute W/chest 01/25/2011  *RADIOLOGY REPORT*  Clinical Data: Abdominal distention.  Prior total colectomy.  ACUTE ABDOMEN SERIES (ABDOMEN 2 VIEW & CHEST 1 VIEW)  Comparison: 01/24/2011  Findings: The very low lung volumes are present bilaterally.  Nasogastric tube tip projects over the stomach body.  There is likely atelectasis in both lower lobes.  The left side down lateral decubitus view of the abdomen demonstrates air fluid levels and scattered loops of dilated small bowel.  On supine imaging, the small bowel measures up to 6.2 cm in diameter, increased from previous.  No significant colonic stool or gas is observed.  IMPRESSION:  1.  Increasing distention of small bowel loops.  Although small bowel obstruction is favored based on the radiographic appearance, ileus is not totally excluded.  Original Report Authenticated By: Carron Curie, M.D.   Dg Abd Acute W/chest 01/24/2011  *RADIOLOGY REPORT*  Clinical Data: Abdominal pain and fever.  Follow up ileus versus small bowel obstruction.  ACUTE ABDOMEN SERIES (ABDOMEN 2 VIEW & CHEST 1 VIEW)  Comparison: 01/22/2011  Findings: Heart size appears moderately enlarged.  There is no pleural effusion or pulmonary edema.  Lung volumes are low.  Asymmetric elevation of the right hemidiaphragm noted.  No airspace consolidation.  Abnormal small bowel dilatation is again noted.  The small bowel loops measure up to 5 cm.  This is increased from 4 cm previously.  Prior posterior hardware fixation is identified within the lower lumbar spine.  IMPRESSION:  1.  Progression of small bowel dilatation.  Original Report Authenticated By: Angelita Ingles, M.D.   Dg Abd Acute W/chest 01/22/2011  *RADIOLOGY REPORT*  Clinical Data: Epigastric pain and distention.  Nausea vomiting and diarrhea.  Weakness.  Fever.  Chills.  ACUTE ABDOMEN SERIES (ABDOMEN 2 VIEW & CHEST 1 VIEW)  Comparison: 01/20/2011 CT abdomen.  Most recent plain film chest of 06/27/2010.  Findings: Frontal view of the chest demonstrates midline trachea. Mild cardiomegaly, accentuated by low lung volumes.  Moderate right hemidiaphragm elevation is not significantly changed. No pleural effusion or pneumothorax.  A left upper lobe calcified granuloma.  Mild vascular crowding and infrahilar atelectasis on the right.  Abominal films demonstrate no free intraperitoneal air on upright positioning.  Air fluid levels within the mid small bowel loops. Mottled lucency about the right abdomen is favored to be gas within nearly completely fluid filled bowel loops.  Small bowel dilatation at 4.0 cm in the right upper abdomen. Paucity of distal gas.  Lumbosacral spine fixation.  IMPRESSION: Findings suspicious for small bowel obstruction. Consider CT.  Original Report Authenticated By: Areta Haber, M.D.    Subjective:     No acute event over night.  He states that his mouth his dry and want something to keep it moist and he has been eating ice chips.  He states that he want to pee but I advised him that a Foley is in place and he don't have to worry.  He states that he has some pain due to surgery.  He denies HA, SOB, CP, sweating.  Objective:  Vital signs for last 24 hours: Temp:  [99 F (37.2 C)-101.7 F (38.7 C)] 100.2 F (37.9 C) (12/16 1400) Pulse Rate:  [65-97] 66  (12/16 1400) Resp:  [19-32] 21  (12/16 1400) BP: (129-170)/(59-98) 151/79 mmHg (12/16 1400) SpO2:  [93 %-98 %] 97 % (12/16 1400) Weight:  [90 kg (198 lb 6.6 oz)] 198 lb 6.6  oz (90 kg) (12/16 0500)  Hemodynamic parameters for last 24 hours: CVP:  [5 mmHg-12 mmHg] 5 mmHg  Intake/Output from previous day: 12/15 0701 - 12/16 0700 In: 2086.4 [I.V.:808.9; NG/GT:90; IV Piggyback:1187.5] Out: 2565 [Urine:1735; Emesis/NG output:300; Drains:180; Stool:350]  Intake/Output this shift: Total I/O In: 584.4 [I.V.:186.9; IV Piggyback:397.5] Out: 1500 [Urine:600; Emesis/NG output:300; Stool:600]  Vent settings for last 24 hours: Patient is not on ventilator supports  Physical Exam:  General: Well developed male in pain lying in bed HEENT: NCAT, no JVD, no LAD, no discharge, EOMI, Lungs:  CTAB anteriorly, no adventitious sounds CVS:  RRR, s1 and s2 heard, no  murmur/rub/gallop Abdomen: Bowel sound not appreciated, NG tube suction heard   Distended stomach, tender to palpation, and guard Extremities: Intact, (+) pulses and equal, no cyanosis, no edema Neuro:  AAOx3, in mild distress due to pain   Assessment/Plan:  Severe Sepsis/Afib with RVR Resolve, now hemodynamically stable and no distress except for pain No longer on pressure support and slightly hypertensive at this point Lactic Acid 1.3, Procalcitonin 6.67 Continue current empiric abx and antifungal agents  Post Drainage Placement for Acute Cholecystitis Biliary tube drainage still have bloody discharge Nasal gastric tube still drain bile color liquid Fever spike with Tmax 101.7 Currently on Vancomycin, Zosyn, and Fluconazole for broad coverage Check CBC in AM to follow  Mild Respiratory Failure Last ABG is good, patients is on [place] and not oxygen dependent at baseline No improvement on lung opacity on CXR today and slight negative balance yesterday Rt base atx, bowel loops contribution Add IS Upright position pcxr  Hypernatremia and Hypokalemia Will replace Potassium  Check BMET and Magnesium level in AM to follow Change to 1/2 NS to avoid NON AG   consider transfer   LOS: 5 days   Additional comments:  Lavon Paganini. Titus Mould, MD, [place] Pgr: [place], DONG 01/27/2011

## 2011-01-27 NOTE — Progress Notes (Signed)
Aromatherapy employed for pt's C/O abdominal pain. After education done, verbal consent obtained from pt for aromatherapy. Five drops lavender essential oil added to pt's bath basin, as well as to his lotion. 0.5 mg Dilaudid given IV and heat pack applied to mid upper abdomen. Pt says that he is more relaxed, and is resting quietly. Will continue to monitor.

## 2011-01-27 NOTE — Progress Notes (Addendum)
Subjective: Patient doing fairly well; c/o some buttocks discomfort; no increased abd pain, NG in place  Objective: Vital signs in last 24 hours: Temp:  [99 F (37.2 C)-101.7 F (38.7 C)] 100 F (37.8 C) (12/16 1100) Pulse Rate:  [65-97] 65  (12/16 1100) Resp:  [19-35] 20  (12/16 1100) BP: (108-170)/(55-98) 136/63 mmHg (12/16 1100) SpO2:  [93 %-98 %] 98 % (12/16 1100) Weight:  [198 lb 6.6 oz (90 kg)] 198 lb 6.6 oz (90 kg) (12/16 0500) Last BM Date: 01/27/11  Intake/Output from previous day: 12/15 0701 - 12/16 0700 In: 2086.4 [I.V.:808.9; NG/GT:90; IV Piggyback:1187.5] Out: 2565 [Urine:1735; Emesis/NG output:300; Drains:180; Stool:350] Intake/Output this shift: Total I/O In: 265.1 [I.V.:80.1; IV Piggyback:185] Out: 275 [Urine:275]  GB drain intact , output about 180 cc's today of blood-tinged bile, cx's pend  Lab Results:   [facility] 01/27/11 0455 01/25/11 0645  WBC 13.0* 5.9  HGB 13.0 15.1  HCT 38.1* 45.8  PLT 200 183   BMET  Basename 01/27/11 0455 01/26/11 0858  NA 149* 147*  K 3.5 3.7  CL 121* 118*  CO2 19 19  GLUCOSE 138* 160*  BUN 53* 50*  CREATININE 1.45* 1.43*  CALCIUM 8.7 9.0   PT/INR  Basename 01/27/11 0455 01/26/11 0858  LABPROT 18.9* 17.9*  INR 1.55* 1.45   ABG  Basename 01/26/11 0854  PHART 7.355  HCO3 15.9*   Results for orders placed during the hospital encounter of 01/22/11  CULTURE, BLOOD (ROUTINE X 2)     Status: Normal (Preliminary result)   Collection Time   01/22/11 10:07 PM      Component Value Range Status Comment   Specimen Description BLOOD RIGHT ARM   Final    Special Requests BOTTLES DRAWN AEROBIC AND ANAEROBIC 10CC   Final    Setup Time [id number]   Final    Culture     Final    Value:        BLOOD CULTURE RECEIVED NO GROWTH TO DATE CULTURE WILL BE HELD FOR 5 DAYS BEFORE ISSUING A FINAL NEGATIVE REPORT   Report Status PENDING   Incomplete   CULTURE, BLOOD (ROUTINE X 2)     Status: Normal (Preliminary result)   Collection Time   01/22/11 10:15 PM      Component Value Range Status Comment   Specimen Description BLOOD RIGHT HAND   Final    Special Requests BOTTLES DRAWN AEROBIC AND ANAEROBIC 10CC   Final    Setup Time [id number]   Final    Culture     Final    Value:        BLOOD CULTURE RECEIVED NO GROWTH TO DATE CULTURE WILL BE HELD FOR 5 DAYS BEFORE ISSUING A FINAL NEGATIVE REPORT   Report Status PENDING   Incomplete   URINE CULTURE     Status: Normal   Collection Time   01/23/11  7:00 AM      Component Value Range Status Comment   Specimen Description URINE, RANDOM   Final    Special Requests NONE   Final    Setup Time [id number]   Final    Colony Count NO GROWTH   Final    Culture NO GROWTH   Final    Report Status 01/24/2011 FINAL   Final   URINE CULTURE     Status: Normal   Collection Time   01/24/11  3:13 PM      Component Value Range Status Comment   Specimen Description URINE, CLEAN  CATCH   Final    Special Requests NONE   Final    Setup Time [id number]   Final    Colony Count NO GROWTH   Final    Culture NO GROWTH   Final    Report Status 01/25/2011 FINAL   Final   MRSA PCR SCREENING     Status: Normal   Collection Time   01/26/11  5:35 AM      Component Value Range Status Comment   MRSA by PCR NEGATIVE  NEGATIVE  Final   CULTURE, BLOOD (ROUTINE X 2)     Status: Normal (Preliminary result)   Collection Time   01/26/11  9:00 AM      Component Value Range Status Comment   Specimen Description BLOOD   Final    Special Requests     Final    Value: BOTTLES DRAWN AEROBIC AND ANAEROBIC 5CC FROM TRIPLE LUMEN CATH LT IJ   Setup Time [id number]   Final    Culture     Final    Value:        BLOOD CULTURE RECEIVED NO GROWTH TO DATE CULTURE WILL BE HELD FOR 5 DAYS BEFORE ISSUING A FINAL NEGATIVE REPORT   Report Status PENDING   Incomplete   CULTURE, BLOOD (ROUTINE X 2)     Status: Normal (Preliminary result)   Collection Time   01/26/11 10:00 AM      Component Value Range  Status Comment   Specimen Description BLOOD LEFT ARM   Final    Special Requests BOTTLES DRAWN AEROBIC AND ANAEROBIC 5CC   Final    Setup Time [id number]   Final    Culture     Final    Value:        BLOOD CULTURE RECEIVED NO GROWTH TO DATE CULTURE WILL BE HELD FOR 5 DAYS BEFORE ISSUING A FINAL NEGATIVE REPORT   Report Status PENDING   Incomplete   CULTURE, ROUTINE-ABSCESS     Status: Normal (Preliminary result)   Collection Time   01/26/11  3:00 PM      Component Value Range Status Comment   Specimen Description ABSCESS GALL BLADDER   Final    Special Requests PATIENT ON FOLLOWING VANCOMYCIN ZOSYN   Final    Gram Stain PENDING   Incomplete    Culture Culture reincubated for better growth   Final    Report Status PENDING   Incomplete   CULTURE, ROUTINE-ABSCESS     Status: Normal (Preliminary result)   Collection Time   01/26/11  8:37 PM      Component Value Range Status Comment   Specimen Description ABSCESS GALL BLADDER FLUID   Final    Special Requests NONE   Final    Gram Stain PENDING   Incomplete    Culture Culture reincubated for better growth   Final    Report Status PENDING   Incomplete      Studies/Results: Ct Guided Abscess Drain  01/26/2011  *RADIOLOGY REPORT*  Indication: Acute cholecystitis, poor surgical candidate  CT GUIDED CHOLECYSTOMY TUBE PLACEMENT  Comparison: CT of the abdomen and pelvis - 01/25/2011  Medications: Conscious sedation was achieved intravenous Fentanyl and Versed.  The patient is admitted to the hospital and currently on intravenous antibiotics.  Total Moderate Sedation time: 40 minutes  Contrast: None  Complications: None immediate  Technique / Findings:  Informed written consent was obtained from the patient's white after a discussion of the risks, benefits and alternatives to treatment.  Review of the preprocedural CT demonstrates multiple loops of interposed small bowel nearly surrounding the gallbladder fossa.  As such, the decision was made to  perform the procedure with CT guidance.  The patient was placed supine, slightly left lateral decubitus on the CT gantry and a pre procedural CT was performed demonstrating a moderate amount of perihepatic fluid within distended, thick-walled gallbladder.  The procedure was planned.   A timeout was performed prior to the initiation of the procedure.  The right lateral upper abdomen prepped and draped in the usual sterile fashion.   The overlying soft tissues were anesthetized with 1% lidocaine with epinephrine. Appropriate transhepatic trajectory was planned with a 22 gauge spinal needle.  Ultimately, the gallbladder fossa was accessed with an Accustick needle with a combination of intermittent CT fluoroscopic guidance and ultimately intermittent CT images.  An 0.018 wire was coiled within the gallbladder fossa and appropriate positioning was confirmed with a limited CT.  The track was dilated with the Accustick set, ultimately allowing placement of a 10.2 [place] all-purpose drainage catheter into the gallbladder over a short Amplatz stiff wire.  A postprocedural scan was performed demonstrating appropriate positioning within the gallbladder and was negative for complication.  The catheter was connected to a drainage bag, secured in place with suture and a dressing was placed. Approximately 20 ml of bile was aspirated and sent to the lab.  The patient tolerated the procedure well without immediate postprocedural complication.  Impression:  Successful CT guided placement of a 10.2 [place] all purpose drain into the gallbladder.  Samples were sent to the laboratory as requested by the ordering clinical team.  Original Report Authenticated By: Rachel Moulds, M.D.   Ct Abdomen Pelvis W Contrast  01/25/2011  *RADIOLOGY REPORT*  Clinical Data: Abdominal distention, history of prior total colectomy, evaluate for small bowel obstruction  CT ABDOMEN AND PELVIS WITH CONTRAST  Technique:  Multidetector CT imaging of the  abdomen and pelvis was performed following the standard protocol during bolus administration of intravenous contrast.  Contrast: 39mL OMNIPAQUE IOHEXOL 300 MG/ML IV SOLN  Comparison: Abdomen films from 01/25/2011  Findings: There is atelectasis of the right lower lobe with probable small effusion.  This may represent pneumonia or central endobronchial lesion.  Coronary artery calcifications are noted as well and there is cardiomegaly present.  There is a small amount of perihepatic fluid present.  The liver has a normal appearance with no focal abnormality.  However the gallbladder is abnormal being distended and very thick-walled. Although no definite gallstones are seen, acute cholecystitis is the primary consideration.  The pancreas appears fatty replaced. The adrenal glands and spleen are unremarkable.  The stomach is filled with oral contrast and is unremarkable.  The kidneys enhance with no calculus or mass and no hydronephrosis is seen.  The abdominal aorta is normal in caliber with moderate atheromatous change present.  As noted on plain film there is some gaseous distention of small bowel in this patient with a history of colectomy.  The small bowel appears slightly prominent to the point of anastomosis in the mid abdomen with the remaining segment of rectosigmoid colon.  This small bowel distention may reflect ileus versus partial small bowel obstruction with stricture at the anastomotic site.  There is fluid within the remaining segment of colon.  Hardware for fusion of the lower lumbar spine is noted.  IMPRESSION:  1.  Dilated gallbladder with thickened gallbladder wall.  No definite gallstones.  These findings are highly  suspicious for acute cholecystitis.  Correlate clinically. 2.  Right lower lobe atelectasis and possible pneumonia with small right effusion. 3.  Perihepatic fluid. 4.  Small bowel distention may reflect ileus or partial small bowel obstruction to the point of anastomosis with the  remaining segment of colon in the pelvis.  Original Report Authenticated By: Joretta Bachelor, M.D.   Dg Chest Port 1 View  01/27/2011  *RADIOLOGY REPORT*  Clinical Data: Productive cough, evaluate for pneumonia  PORTABLE CHEST - 1 VIEW  Comparison: 01/26/2011; 01/24/2011; 01/22/2011; 11/27/2009  Findings: Unchanged enlarged cardiac silhouette and mediastinal contours.  Stable position of support apparatus.  There is persistent elevation of the right hemidiaphragm with blunting of the right costophrenic angles and right basilar heterogeneous opacities.  Minimal left basilar heterogeneous opacities.  No pneumothorax.  Cholecystostomy tube overlies the right upper abdominal quadrant.  Unchanged bones.  IMPRESSION: 1.  Persistent elevation of the right hemidiaphragm with small right-sided effusion and right basilar opacities, possibly atelectasis though underlying infection is not excluded. 2. Minimal left basilar heterogeneous opacities, possibly atelectasis  Original Report Authenticated By: Rachel Moulds, M.D.   Dg Chest Port 1 View  01/26/2011  *RADIOLOGY REPORT*  Clinical Data: Line placement.  PORTABLE CHEST - 1 VIEW  Comparison: 01/24/2011  Findings: Nasogastric tube is in place with tip overlying the level of the stomach.  Left IJ central line tip overlies the level of the superior vena cava.  The film is very shallow lung inflation.  There is elevation of the right hemidiaphragm as before.  There is no evidence for pneumothorax.  Heart is enlarged.  There is perihilar bronchitic change.  Pulmonary vascular congestion is present.  There is bibasilar atelectasis.  IMPRESSION:  1.  Interval placement of nasogastric tube and left IJ central line. 2.  No evidence for postprocedure pneumothorax.  Original Report Authenticated By: Glenice Bow, M.D.    Anti-infectives: Anti-infectives     Start     Dose/Rate Route Frequency Ordered Stop   01/27/11 1200   fluconazole (DIFLUCAN) IVPB 400 mg         400 mg 200 mL/hr over 60 Minutes Intravenous Every 24 hours 01/26/11 0940     01/26/11 2200   vancomycin (VANCOCIN) 750 mg in sodium chloride 0.9 % 150 mL IVPB        750 mg 150 mL/hr over 60 Minutes Intravenous Every 12 hours 01/26/11 0934     01/26/11 1200   piperacillin-tazobactam (ZOSYN) IVPB 4.5 g  Status:  Discontinued        4.5 g 200 mL/hr over 30 Minutes Intravenous 4 times per day 01/26/11 0850 01/26/11 0929   01/26/11 1100   fluconazole (DIFLUCAN) IVPB 800 mg        800 mg 400 mL/hr over 60 Minutes Intravenous  Once 01/26/11 0938 01/26/11 1200   01/26/11 1000   vancomycin (VANCOCIN) 1,500 mg in sodium chloride 0.9 % 500 mL IVPB        1,500 mg 250 mL/hr over 120 Minutes Intravenous  Once 01/26/11 0933 01/26/11 1521   01/26/11 0930  piperacillin-tazobactam (ZOSYN) IVPB 3.375 g       3.375 g 12.5 mL/hr over 240 Minutes Intravenous 3 times per day 01/26/11 0929     01/26/11 0900   fluconazole (DIFLUCAN) IVPB 800 mg  Status:  Discontinued        800 mg 400 mL/hr over 60 Minutes Intravenous Every 24 hours 01/26/11 0850 01/26/11 0935   01/26/11  0900   vancomycin (VANCOCIN) IVPB 1000 mg/200 mL premix  Status:  Discontinued     Comments: STAT      1,000 mg 200 mL/hr over 60 Minutes Intravenous Every 12 hours 01/26/11 0850 01/26/11 0932   01/25/11 1030   ertapenem (INVANZ) 1 g in sodium chloride 0.9 % 50 mL IVPB  Status:  Discontinued        1 g 100 mL/hr over 30 Minutes Intravenous Every 24 hours 01/25/11 0952 01/26/11 0850   01/24/11 1000   azithromycin (ZITHROMAX) 500 mg in dextrose 5 % 250 mL IVPB  Status:  Discontinued        500 mg 250 mL/hr over 60 Minutes Intravenous Every 24 hours 01/24/11 0840 01/24/11 0945   01/24/11 0930   cefTRIAXone (ROCEPHIN) 1 g in dextrose 5 % 50 mL IVPB  Status:  Discontinued        1 g 100 mL/hr over 30 Minutes Intravenous Every 24 hours 01/24/11 0840 01/24/11 0945          Assessment/Plan: S/p percutaneous cholecystostomy 12/16;  cont current treatment, check cx's , drain NS flushes; drain will need to remain in place at least 4-6 weeks unless cholecystectomy done in interim.    LOS: 5 days    ALLRED,D [facility] 01/27/2011

## 2011-01-28 ENCOUNTER — Inpatient Hospital Stay (HOSPITAL_COMMUNITY): Payer: Medicare Other

## 2011-01-28 LAB — COMPREHENSIVE METABOLIC PANEL
ALT: 19 U/L (ref 0–53)
Albumin: 1.9 g/dL — ABNORMAL LOW (ref 3.5–5.2)
Alkaline Phosphatase: 61 U/L (ref 39–117)
BUN: 55 mg/dL — ABNORMAL HIGH (ref 6–23)
Calcium: 8.5 mg/dL (ref 8.4–10.5)
Chloride: 120 mEq/L — ABNORMAL HIGH (ref 96–112)
Creatinine, Ser: 1.56 mg/dL — ABNORMAL HIGH (ref 0.50–1.35)
GFR calc Af Amer: 48 mL/min — ABNORMAL LOW (ref >90)
GFR calc non Af Amer: 41 mL/min — ABNORMAL LOW (ref >90)
Total Protein: 5.1 g/dL — ABNORMAL LOW (ref 6.0–8.3)

## 2011-01-28 LAB — DIFFERENTIAL
Basophils Absolute: 0.1 10*3/uL (ref 0.0–0.1)
Basophils Relative: 0 % (ref 0–1)
Eosinophils Absolute: 0.2 10*3/uL (ref 0.0–0.7)
Monocytes Absolute: 0.8 10*3/uL (ref 0.1–1.0)
Neutro Abs: 15 10*3/uL — ABNORMAL HIGH (ref 1.7–7.7)
Neutrophils Relative %: 90 % — ABNORMAL HIGH (ref 43–77)

## 2011-01-28 LAB — GLUCOSE, CAPILLARY
Glucose-Capillary: 109 mg/dL — ABNORMAL HIGH (ref 70–99)
Glucose-Capillary: 110 mg/dL — ABNORMAL HIGH (ref 70–99)

## 2011-01-28 LAB — CBC
HCT: 38.3 % — ABNORMAL LOW (ref 39.0–52.0)
MCH: 29.7 pg (ref 26.0–34.0)
MCHC: 33.4 g/dL (ref 30.0–36.0)
RDW: 14.8 % (ref 11.5–15.5)

## 2011-01-28 LAB — TSH: TSH: 0.958 u[IU]/mL (ref 0.350–4.500)

## 2011-01-28 MED ORDER — POTASSIUM CHLORIDE 10 MEQ/50ML IV SOLN
10.0000 meq | INTRAVENOUS | Status: AC
  Administered 2011-01-28 (×2): 10 meq via INTRAVENOUS
  Filled 2011-01-28: qty 100

## 2011-01-28 MED ORDER — DEXTROSE 5 % IV SOLN
INTRAVENOUS | Status: DC
  Administered 2011-01-28 – 2011-02-01 (×3): via INTRAVENOUS

## 2011-01-28 MED ORDER — FUROSEMIDE 10 MG/ML IJ SOLN
40.0000 mg | Freq: Two times a day (BID) | INTRAMUSCULAR | Status: DC
  Administered 2011-01-28 – 2011-01-29 (×3): 40 mg via INTRAVENOUS
  Filled 2011-01-28 (×4): qty 4

## 2011-01-28 NOTE — Progress Notes (Signed)
Subjective: No complaints.  Objective: Vital signs in last 24 hours: Temp:  [99.7 F (37.6 C)-100.6 F (38.1 C)] 99.9 F (37.7 C) (12/17 1000) Pulse Rate:  [48-75] 54  (12/17 1000) Resp:  [19-31] 21  (12/17 1000) BP: (135-162)/(53-95) 147/65 mmHg (12/17 1000) SpO2:  [95 %-99 %] 99 % (12/17 1000) Weight:  [195 lb 15.8 oz (88.9 kg)] 195 lb 15.8 oz (88.9 kg) (12/17 0500) Last BM Date: 01/28/11  Intake/Output from previous day: 12/16 0701 - 12/17 0700 In: 2130.6 [I.V.:1520.6; IV Piggyback:610] Out: 3000 [Urine:1750; Emesis/NG output:600; Drains:50; Stool:600] Intake/Output this shift: Total I/O In: 205 [I.V.:150; NG/GT:30; IV Piggyback:25] Out: 425 [Urine:200; Emesis/NG output:225]  GI: soft, nontender. drain putting out bilious fluid  Lab Results:   [facility] 01/28/11 0442 01/27/11 0455  WBC 16.6* 13.0*  HGB 12.8* 13.0  HCT 38.3* 38.1*  PLT 188 200   BMET  Basename 01/28/11 0442 01/27/11 1400  NA 149* 150*  K 3.5 3.4*  CL 120* 122*  CO2 21 22  GLUCOSE 117* 121*  BUN 55* 51*  CREATININE 1.56* 1.50*  CALCIUM 8.5 8.4   PT/INR  Basename 01/27/11 0455 01/26/11 0858  LABPROT 18.9* 17.9*  INR 1.55* 1.45   ABG  Basename 01/26/11 0854  PHART 7.355  HCO3 15.9*    Studies/Results: Ct Guided Abscess Drain  01/26/2011  *RADIOLOGY REPORT*  Indication: Acute cholecystitis, poor surgical candidate  CT GUIDED CHOLECYSTOMY TUBE PLACEMENT  Comparison: CT of the abdomen and pelvis - 01/25/2011  Medications: Conscious sedation was achieved intravenous Fentanyl and Versed.  The patient is admitted to the hospital and currently on intravenous antibiotics.  Total Moderate Sedation time: 40 minutes  Contrast: None  Complications: None immediate  Technique / Findings:  Informed written consent was obtained from the patient's white after a discussion of the risks, benefits and alternatives to treatment.  Review of the preprocedural CT demonstrates multiple loops of interposed  small bowel nearly surrounding the gallbladder fossa.  As such, the decision was made to perform the procedure with CT guidance.  The patient was placed supine, slightly left lateral decubitus on the CT gantry and a pre procedural CT was performed demonstrating a moderate amount of perihepatic fluid within distended, thick-walled gallbladder.  The procedure was planned.   A timeout was performed prior to the initiation of the procedure.  The right lateral upper abdomen prepped and draped in the usual sterile fashion.   The overlying soft tissues were anesthetized with 1% lidocaine with epinephrine. Appropriate transhepatic trajectory was planned with a 22 gauge spinal needle.  Ultimately, the gallbladder fossa was accessed with an Accustick needle with a combination of intermittent CT fluoroscopic guidance and ultimately intermittent CT images.  An 0.018 wire was coiled within the gallbladder fossa and appropriate positioning was confirmed with a limited CT.  The track was dilated with the Accustick set, ultimately allowing placement of a 10.2 [place] all-purpose drainage catheter into the gallbladder over a short Amplatz stiff wire.  A postprocedural scan was performed demonstrating appropriate positioning within the gallbladder and was negative for complication.  The catheter was connected to a drainage bag, secured in place with suture and a dressing was placed. Approximately 20 ml of bile was aspirated and sent to the lab.  The patient tolerated the procedure well without immediate postprocedural complication.  Impression:  Successful CT guided placement of a 10.2 [place] all purpose drain into the gallbladder.  Samples were sent to the laboratory as requested by the ordering clinical team.  Original Report Authenticated By: Rachel Moulds, M.D.   Dg Chest Port 1 View  01/28/2011  *RADIOLOGY REPORT*  Clinical Data: Check endotracheal tube position.  PORTABLE CHEST - 1 VIEW  Comparison: 01/27/2011  Findings:  Cardiomediastinal silhouette is stable.  There is worsening in aeration with mild congestion/edema.  Persistent small right pleural effusion with right basilar atelectasis or infiltrate.  Drainage catheter in the right upper quadrant of the abdomen is stable.  NG tube in place.  No endotracheal tube is identified.  Stable left IJ central line position  IMPRESSION:  There is worsening in aeration with mild congestion/edema. Persistent small right pleural effusion with right basilar atelectasis or infiltrate.  Drainage catheter in the right upper quadrant of the abdomen is stable.  NG tube in place.  No endotracheal tube is identified.  Original Report Authenticated By: Lahoma Crocker, M.D.   Dg Chest Port 1 View  01/27/2011  *RADIOLOGY REPORT*  Clinical Data: Productive cough, evaluate for pneumonia  PORTABLE CHEST - 1 VIEW  Comparison: 01/26/2011; 01/24/2011; 01/22/2011; 11/27/2009  Findings: Unchanged enlarged cardiac silhouette and mediastinal contours.  Stable position of support apparatus.  There is persistent elevation of the right hemidiaphragm with blunting of the right costophrenic angles and right basilar heterogeneous opacities.  Minimal left basilar heterogeneous opacities.  No pneumothorax.  Cholecystostomy tube overlies the right upper abdominal quadrant.  Unchanged bones.  IMPRESSION: 1.  Persistent elevation of the right hemidiaphragm with small right-sided effusion and right basilar opacities, possibly atelectasis though underlying infection is not excluded. 2. Minimal left basilar heterogeneous opacities, possibly atelectasis  Original Report Authenticated By: Rachel Moulds, M.D.    Anti-infectives: Anti-infectives     Start     Dose/Rate Route Frequency Ordered Stop   01/27/11 1200   fluconazole (DIFLUCAN) IVPB 400 mg        400 mg 200 mL/hr over 60 Minutes Intravenous Every 24 hours 01/26/11 0940     01/26/11 2200   vancomycin (VANCOCIN) 750 mg in sodium chloride 0.9 % 150 mL IVPB         750 mg 150 mL/hr over 60 Minutes Intravenous Every 12 hours 01/26/11 0934     01/26/11 1200   piperacillin-tazobactam (ZOSYN) IVPB 4.5 g  Status:  Discontinued        4.5 g 200 mL/hr over 30 Minutes Intravenous 4 times per day 01/26/11 0850 01/26/11 0929   01/26/11 1100   fluconazole (DIFLUCAN) IVPB 800 mg        800 mg 400 mL/hr over 60 Minutes Intravenous  Once 01/26/11 0938 01/26/11 1200   01/26/11 1000   vancomycin (VANCOCIN) 1,500 mg in sodium chloride 0.9 % 500 mL IVPB        1,500 mg 250 mL/hr over 120 Minutes Intravenous  Once 01/26/11 0933 01/26/11 1521   01/26/11 0930   piperacillin-tazobactam (ZOSYN) IVPB 3.375 g        3.375 g 12.5 mL/hr over 240 Minutes Intravenous 3 times per day 01/26/11 0929     01/26/11 0900   fluconazole (DIFLUCAN) IVPB 800 mg  Status:  Discontinued        800 mg 400 mL/hr over 60 Minutes Intravenous Every 24 hours 01/26/11 0850 01/26/11 0935   01/26/11 0900   vancomycin (VANCOCIN) IVPB 1000 mg/200 mL premix  Status:  Discontinued     Comments: STAT      1,000 mg 200 mL/hr over 60 Minutes Intravenous Every 12 hours 01/26/11 0850 01/26/11 0932  01/25/11 1030   ertapenem (INVANZ) 1 g in sodium chloride 0.9 % 50 mL IVPB  Status:  Discontinued        1 g 100 mL/hr over 30 Minutes Intravenous Every 24 hours 01/25/11 0952 01/26/11 0850   01/24/11 1000   azithromycin (ZITHROMAX) 500 mg in dextrose 5 % 250 mL IVPB  Status:  Discontinued        500 mg 250 mL/hr over 60 Minutes Intravenous Every 24 hours 01/24/11 0840 01/24/11 0945   01/24/11 0930   cefTRIAXone (ROCEPHIN) 1 g in dextrose 5 % 50 mL IVPB  Status:  Discontinued        1 g 100 mL/hr over 30 Minutes Intravenous Every 24 hours 01/24/11 0840 01/24/11 0945          Assessment/Plan: s/p Procedure(s): LAPAROSCOPIC CHOLECYSTECTOMY WITH INTRAOPERATIVE CHOLANGIOGRAM continue ng and bowel rest. Continue perc drain Continue abx  LOS: 6 days    TOTH III,Danyetta Gillham S 01/28/2011

## 2011-01-28 NOTE — Progress Notes (Signed)
Follow up - Critical Care Medicine Note  Patient Details:    Gary [name] is an 77 y.o. male with PMH of CAD, diveritcular bleed, and colectomy.  CT of abdomen with gallbladder thickening and on 12/15 he became confused, tachycardic, and was transfer to SDU.  PCCM aked to evaluate and assist in his care.   Lines, Airways, Drains: 12/15 Left IJ CVL >>> 12/15 Biliary Drainage Tube >>> 12/15 NG/OG tube >>> 12/15 Rectal pouch >>> 12/15 Foley Catheter >>>  Anti-infectives:  12/15 Fluconazole >>>12/17 12/15 Vancomycin >>>12/17 12/15 Zosyn >>>  Other Medications/IVF: 12/15 Amiodarone >>>12/17 brady  Microbiology: 12/11 Blood Culture x2 >>> No growth 12/13 Urine Culture >>> Negative 12/15 MRSA Screening >>> Negative 12/15 Abscess Culture >>> Pending  Best Practice/Protocols:  PE/DVT: Lovenox GI:  Protonix  Events/Test: 12/14 CT abdomen with dilated gallbladder with thickened wall.  No definite gallstone.  Suggestive of acute cholecysitis  RLL atelectasis and possible pneumonia with small right effusion  Perihepatic fluid  Small bowel distention may reflect ileus or partial SBO to the point of anatomosis with remaining segment of colon  in pelvis 12/15 Biliary tube placement for drainage and acute cholecystitis 12/15 Transfer to PCCM for further care and Left IJ CVP placement, severe sepsis  RADIOGRAPHIC DATA:  01/28/11 PORTABLE CHEST - 1 VIEW Comparison: 01/27/2011 Findings: Cardiomediastinal silhouette is stable. There is worsening in aeration with mild congestion/edema. Persistent small right pleural effusion with right basilar atelectasis or  infiltrate. Drainage catheter in the right upper quadrant of the abdomen is stable. NG tube in place. No endotracheal tube is  identified. Stable left IJ central line position IMPRESSION: There is worsening in aeration with mild congestion/edema. Persistent small right pleural effusion with right basilar atelectasis or infiltrate.  Drainage catheter in the right upper quadrant of the abdomen is stable. NG tube in place. No endotracheal tube is identified.  Dg Chest 2 View 01/23/2011  *RADIOLOGY REPORT*  Clinical Data: Fever and upper abdominal pain  CHEST - 2 VIEW  Comparison: 06/27/2010  Findings: Shallow inspiration.  Infiltration or atelectasis in both lung bases, greater on the right.  This is new since the previous study may represent pneumonia.  Borderline heart size and pulmonary vascularity.  Calcified granulomas in the upper lungs.  The upper abdomen demonstrates bowel interposition in front of the liver and under the right hemidiaphragm.  Suggestion of mild gaseous distension of small bowel loops with air-fluid levels.  Small bowel obstruction should be considered.  Abdominal films likely would be useful in further evaluation.  IMPRESSION: Infiltration or atelectasis in both lung bases, greater on the right, suggesting pneumonia.  Right upper quadrant suggest possible small bowel dilatation and air-fluid levels.  Obstruction should be excluded.  Original Report Authenticated By: Neale Burly, M.D.   Ct Guided Abscess Drain 01/26/2011  *RADIOLOGY REPORT*  Indication: Acute cholecystitis, poor surgical candidate  CT GUIDED CHOLECYSTOMY TUBE PLACEMENT  Comparison: CT of the abdomen and pelvis - 01/25/2011  Medications: Conscious sedation was achieved intravenous Fentanyl and Versed.  The patient is admitted to the hospital and currently on intravenous antibiotics.  Total Moderate Sedation time: 40 minutes  Contrast: None  Complications: None immediate  Technique / Findings:  Informed written consent was obtained from the patient's white after a discussion of the risks, benefits and alternatives to treatment.  Review of the preprocedural CT demonstrates multiple loops of interposed small bowel nearly surrounding the gallbladder fossa.  As such, the decision was made to perform the  procedure with CT guidance.  The patient was  placed supine, slightly left lateral decubitus on the CT gantry and a pre procedural CT was performed demonstrating a moderate amount of perihepatic fluid within distended, thick-walled gallbladder.  The procedure was planned.   A timeout was performed prior to the initiation of the procedure.  The right lateral upper abdomen prepped and draped in the usual sterile fashion.   The overlying soft tissues were anesthetized with 1% lidocaine with epinephrine. Appropriate transhepatic trajectory was planned with a 22 gauge spinal needle.  Ultimately, the gallbladder fossa was accessed with an Accustick needle with a combination of intermittent CT fluoroscopic guidance and ultimately intermittent CT images.  An 0.018 wire was coiled within the gallbladder fossa and appropriate positioning was confirmed with a limited CT.  The track was dilated with the Accustick set, ultimately allowing placement of a 10.2 [place] all-purpose drainage catheter into the gallbladder over a short Amplatz stiff wire.  A postprocedural scan was performed demonstrating appropriate positioning within the gallbladder and was negative for complication.  The catheter was connected to a drainage bag, secured in place with suture and a dressing was placed. Approximately 20 ml of bile was aspirated and sent to the lab.  The patient tolerated the procedure well without immediate postprocedural complication.  Impression:  Successful CT guided placement of a 10.2 [place] all purpose drain into the gallbladder.  Samples were sent to the laboratory as requested by the ordering clinical team.  Original Report Authenticated By: Rachel Moulds, M.D.   Ct Abdomen Pelvis W Contrast 01/25/2011  *RADIOLOGY REPORT*  Clinical Data: Abdominal distention, history of prior total colectomy, evaluate for small bowel obstruction  CT ABDOMEN AND PELVIS WITH CONTRAST  Technique:  Multidetector CT imaging of the abdomen and pelvis was performed following the standard  protocol during bolus administration of intravenous contrast.  Contrast: 35mL OMNIPAQUE IOHEXOL 300 MG/ML IV SOLN  Comparison: Abdomen films from 01/25/2011  Findings: There is atelectasis of the right lower lobe with probable small effusion.  This may represent pneumonia or central endobronchial lesion.  Coronary artery calcifications are noted as well and there is cardiomegaly present.  There is a small amount of perihepatic fluid present.  The liver has a normal appearance with no focal abnormality.  However the gallbladder is abnormal being distended and very thick-walled. Although no definite gallstones are seen, acute cholecystitis is the primary consideration.  The pancreas appears fatty replaced. The adrenal glands and spleen are unremarkable.  The stomach is filled with oral contrast and is unremarkable.  The kidneys enhance with no calculus or mass and no hydronephrosis is seen.  The abdominal aorta is normal in caliber with moderate atheromatous change present.  As noted on plain film there is some gaseous distention of small bowel in this patient with a history of colectomy.  The small bowel appears slightly prominent to the point of anastomosis in the mid abdomen with the remaining segment of rectosigmoid colon.  This small bowel distention may reflect ileus versus partial small bowel obstruction with stricture at the anastomotic site.  There is fluid within the remaining segment of colon.  Hardware for fusion of the lower lumbar spine is noted.  IMPRESSION:  1.  Dilated gallbladder with thickened gallbladder wall.  No definite gallstones.  These findings are highly suspicious for acute cholecystitis.  Correlate clinically. 2.  Right lower lobe atelectasis and possible pneumonia with small right effusion. 3.  Perihepatic fluid. 4.  Small bowel  distention may reflect ileus or partial small bowel obstruction to the point of anastomosis with the remaining segment of colon in the pelvis.  Original Report  Authenticated By: Joretta Bachelor, M.D.   Ct Abdomen Pelvis W Contrast 01/20/2011  *RADIOLOGY REPORT*  Clinical Data: Abdominal pain, nausea, vomiting, diarrhea.  History of colectomy.  CT ABDOMEN AND PELVIS WITH CONTRAST  Technique:  Multidetector CT imaging of the abdomen and pelvis was performed following the standard protocol during bolus administration of intravenous contrast.  Contrast: 159mL OMNIPAQUE IOHEXOL 300 MG/ML IV SOLN  Comparison: 01/19/2010  Findings: Infiltration or atelectasis in the right lung base with dependent atelectasis in the left lung base.  Calcified granulomas in the spleen.  The liver, gallbladder, pancreas, adrenal glands, kidneys, and retroperitoneal lymph nodes are unremarkable. Vascular calcifications in the aorta without aneurysm.  Mild thickening of the wall of the distal esophagus possibly due to under distension or inflammatory esophagitis.  No free fluid or free air in the abdomen.  Prominent visceral adipose tissue.  Postoperative changes consistent with some total colectomy with ileal sigmoid anastomoses.  The  distal small bowel are mildly distended and filled with fluid and gas.  There is no wall thickening.  The anastomosis appears patent.  Changes appear to extend to the rectum, suggesting an ileus rather than obstruction. No obstructing lesion is visualized.  Pelvis:  Calcifications in the prostate gland.  No bladder wall thickening.  No free or loculated pelvic fluid collections.  No pelvic lymphadenopathy.  Postoperative changes and degenerative changes in the lumbar spine.  IMPRESSION: Postoperative changes with some total colectomy and ileal sigmoid anastomoses.  Mild distension with fluid filled small bowel loops extending to the anus.  Although anal obstruction is not excluded, changes more likely represent ileus.  No significant wall thickening.  Original Report Authenticated By: Neale Burly, M.D.   Dg Chest Port 1 View 01/27/2011  *RADIOLOGY REPORT*   Clinical Data: Productive cough, evaluate for pneumonia  PORTABLE CHEST - 1 VIEW  Comparison: 01/26/2011; 01/24/2011; 01/22/2011; 11/27/2009  Findings: Unchanged enlarged cardiac silhouette and mediastinal contours.  Stable position of support apparatus.  There is persistent elevation of the right hemidiaphragm with blunting of the right costophrenic angles and right basilar heterogeneous opacities.  Minimal left basilar heterogeneous opacities.  No pneumothorax.  Cholecystostomy tube overlies the right upper abdominal quadrant.  Unchanged bones.  IMPRESSION: 1.  Persistent elevation of the right hemidiaphragm with small right-sided effusion and right basilar opacities, possibly atelectasis though underlying infection is not excluded. 2. Minimal left basilar heterogeneous opacities, possibly atelectasis  Original Report Authenticated By: Rachel Moulds, M.D.   Dg Chest Port 1 View 01/26/2011  *RADIOLOGY REPORT*  Clinical Data: Line placement.  PORTABLE CHEST - 1 VIEW  Comparison: 01/24/2011  Findings: Nasogastric tube is in place with tip overlying the level of the stomach.  Left IJ central line tip overlies the level of the superior vena cava.  The film is very shallow lung inflation.  There is elevation of the right hemidiaphragm as before.  There is no evidence for pneumothorax.  Heart is enlarged.  There is perihilar bronchitic change.  Pulmonary vascular congestion is present.  There is bibasilar atelectasis.  IMPRESSION:  1.  Interval placement of nasogastric tube and left IJ central line. 2.  No evidence for postprocedure pneumothorax.  Original Report Authenticated By: Glenice Bow, M.D.   Dg Chest Port 1 View 01/24/2011  *RADIOLOGY REPORT*  Clinical Data: Shortness of breath  and wheezing.  Chills.  PORTABLE CHEST - 1 VIEW  Comparison: 01/22/2011  Findings: Shallow inspiration with elevation of the right hemidiaphragm.  Infiltration or atelectasis again demonstrated in both lung bases, similar  to previous study.  No blunting of costophrenic angles.  No pneumothorax.  Gas filled dilated bowel loops again demonstrated in the right upper quadrant.  Small bowel obstruction is not excluded.  IMPRESSION: Stable appearance of the chest since the previous study. Infiltration or atelectasis again demonstrated in the lung bases. Gaseous distension of small bowel loops in the right upper quadrant.  Original Report Authenticated By: Neale Burly, M.D.   Dg Abd Acute W/chest 01/25/2011  *RADIOLOGY REPORT*  Clinical Data: Abdominal distention.  Prior total colectomy.  ACUTE ABDOMEN SERIES (ABDOMEN 2 VIEW & CHEST 1 VIEW)  Comparison: 01/24/2011  Findings: The very low lung volumes are present bilaterally. Nasogastric tube tip projects over the stomach body.  There is likely atelectasis in both lower lobes.  The left side down lateral decubitus view of the abdomen demonstrates air fluid levels and scattered loops of dilated small bowel.  On supine imaging, the small bowel measures up to 6.2 cm in diameter, increased from previous.  No significant colonic stool or gas is observed.  IMPRESSION:  1.  Increasing distention of small bowel loops.  Although small bowel obstruction is favored based on the radiographic appearance, ileus is not totally excluded.  Original Report Authenticated By: Carron Curie, M.D.   Dg Abd Acute W/chest 01/24/2011  *RADIOLOGY REPORT*  Clinical Data: Abdominal pain and fever.  Follow up ileus versus small bowel obstruction.  ACUTE ABDOMEN SERIES (ABDOMEN 2 VIEW & CHEST 1 VIEW)  Comparison: 01/22/2011  Findings: Heart size appears moderately enlarged.  There is no pleural effusion or pulmonary edema.  Lung volumes are low.  Asymmetric elevation of the right hemidiaphragm noted.  No airspace consolidation.  Abnormal small bowel dilatation is again noted.  The small bowel loops measure up to 5 cm.  This is increased from 4 cm previously.  Prior posterior hardware fixation is  identified within the lower lumbar spine.  IMPRESSION:  1.  Progression of small bowel dilatation.  Original Report Authenticated By: Angelita Ingles, M.D.   Dg Abd Acute W/chest 01/22/2011  *RADIOLOGY REPORT*  Clinical Data: Epigastric pain and distention.  Nausea vomiting and diarrhea.  Weakness.  Fever.  Chills.  ACUTE ABDOMEN SERIES (ABDOMEN 2 VIEW & CHEST 1 VIEW)  Comparison: 01/20/2011 CT abdomen.  Most recent plain film chest of 06/27/2010.  Findings: Frontal view of the chest demonstrates midline trachea. Mild cardiomegaly, accentuated by low lung volumes.  Moderate right hemidiaphragm elevation is not significantly changed. No pleural effusion or pneumothorax.  A left upper lobe calcified granuloma. Mild vascular crowding and infrahilar atelectasis on the right.  Abominal films demonstrate no free intraperitoneal air on upright positioning.  Air fluid levels within the mid small bowel loops. Mottled lucency about the right abdomen is favored to be gas within nearly completely fluid filled bowel loops.  Small bowel dilatation at 4.0 cm in the right upper abdomen. Paucity of distal gas.  Lumbosacral spine fixation.  IMPRESSION: Findings suspicious for small bowel obstruction. Consider CT.  Original Report Authenticated By: Areta Haber, M.D.    Subjective:     Per nursing, Amiodarone drips was discontinued because the patient HR drop into the 50s and order for PO was initiated for first dose today.  Currently, PO Amiodarone dose is due but his  HR is in the 40s to 50s so holding it until attending round on patient this AM.  We still waiting for a bed in step down to move him.  He complaints of persist pain and genital discomfort.  His NG and Biliary tube still drain billous color liquid.  Objective:  Vital signs for last 24 hours: Temp:  [99.7 F (37.6 C)-100.6 F (38.1 C)] 99.9 F (37.7 C) (12/17 0900) Pulse Rate:  [48-75] 52  (12/17 0900) Resp:  [19-31] 24  (12/17 0900) BP:  (135-162)/(53-95) 141/68 mmHg (12/17 0900) SpO2:  [95 %-99 %] 97 % (12/17 0900) Weight:  [88.9 kg (195 lb 15.8 oz)] 195 lb 15.8 oz (88.9 kg) (12/17 0500)  Hemodynamic parameters for last 24 hours: CVP:  [3 mmHg-10 mmHg] 6 mmHg  Intake/Output from previous day: 12/16 0701 - 12/17 0700 In: 2130.6 [I.V.:1520.6; IV Piggyback:610] Out: 3000 [Urine:1750; Emesis/NG output:600; Drains:50; Stool:600]  Intake/Output this shift: Total I/O In: 205 [I.V.:150; NG/GT:30; IV Piggyback:25] Out: 425 [Urine:200; Emesis/NG output:225]  Vent settings for last 24 hours: Patient is not on ventilator supports  Physical Exam:  General: Well developed male in mild pain lying in bed but not in any acute distress HEENT: NCAT, no JVD, no LAD, no discharge, EOMI, Lungs:  CTAB anteriorly, no adventitious sounds CVS:  RRR, s1 and s2 heard, no murmur/rub/gallop Abdomen: Bowel sound not appreciated   Distended stomach, tender to palpation, and guard - increase from yesterday Extremities: Intact, (+) pulses and equal, no cyanosis, no edema Neuro:  AAOx3, in mild discomfort due to pain   Assessment/Plan:  Severe Sepsis/Afib with RVR Resolve, BP now maintain and on no pressor support HR drops into the 40s and 50s with Amiodarone on board, Amiodarone drip change to PO now.discontinued Questionable Amiodarone needs at this point, patient has no prior history of A. Fib, this is transient due to sepsis? Echo, tsh Lactic Acid 1.3, Procalcitonin 6.67 Continue current empiric abx and antifungal agents This was related to sepsis and least less 30 hours  Post Drainage Placement for Acute Cholecystitis Biliary tube drainage now drain billous with mix blood discharge Nasal gastric tube still drain bile color liquid but has less residues Afebrile for the last 24 hours with Tmax 99.9 WBC at 16.6 today, jump from 13.0 yesterday  Currently on Vancomycin, Zosyn, and Fluconazole for broad coverage, will narrow to zosyn,  remains culture neg Will continue to follow trend and culture and sensitivity  Mild Respiratory Failure On [place] and NOT oxygen dependent at baseline Lung opacity worsen on CXR this AM with elevated WBC, new HCAP vs. A. Fib vs. Bowel Loops Maintain IS, upright position Check CXR to follow in AM Maintain neg balance  Hypernatremia and Hypokalemia Hypokalemia resolved and magnesium level is 2.2 (WNL) Will continue to monitor and replace as needed Maintain IVF with now d5w, goal again neg 500 cc with free ater and lasix Chem in am   TRANSFER ORDERS IN PLACE, AWAITING BED, change to tele Dc line if able   LOS: 6 days   Additional comments:  Lavon Paganini. Titus Mould, MD, [place] Pgr: [place], DONG 01/28/2011

## 2011-01-28 NOTE — Progress Notes (Signed)
  Echocardiogram 2D Echocardiogram has been performed.  Gary [name] [name] 01/28/2011, 3:46 PM

## 2011-01-28 NOTE — Progress Notes (Signed)
Subjective: Patient resting quietly  Objective: Vital signs in last 24 hours: Temp:  [99.7 F (37.6 C)-100.6 F (38.1 C)] 99.9 F (37.7 C) (12/17 1000) Pulse Rate:  [48-75] 54  (12/17 1000) Resp:  [19-31] 21  (12/17 1000) BP: (135-162)/(53-95) 147/65 mmHg (12/17 1000) SpO2:  [95 %-99 %] 99 % (12/17 1000) Weight:  [195 lb 15.8 oz (88.9 kg)] 195 lb 15.8 oz (88.9 kg) (12/17 0500) Last BM Date: 01/28/11  Intake/Output from previous day: 12/16 0701 - 12/17 0700 In: 2130.6 [I.V.:1520.6; IV Piggyback:610] Out: 3000 [Urine:1750; Emesis/NG output:600; Drains:50; Stool:600] Intake/Output this shift: Total I/O In: 430 [I.V.:225; NG/GT:30; IV Piggyback:175] Out: 525 [Urine:300; Emesis/NG output:225]  Biliary drain intact, about 75 cc's light brown bile in bag; cx's pend.  Lab Results:   [facility] 01/28/11 0442 01/27/11 0455  WBC 16.6* 13.0*  HGB 12.8* 13.0  HCT 38.3* 38.1*  PLT 188 200   BMET  Basename 01/28/11 0442 01/27/11 1400  NA 149* 150*  K 3.5 3.4*  CL 120* 122*  CO2 21 22  GLUCOSE 117* 121*  BUN 55* 51*  CREATININE 1.56* 1.50*  CALCIUM 8.5 8.4   PT/INR  Basename 01/27/11 0455 01/26/11 0858  LABPROT 18.9* 17.9*  INR 1.55* 1.45   ABG  Basename 01/26/11 0854  PHART 7.355  HCO3 15.9*   Results for orders placed during the hospital encounter of 01/22/11  CULTURE, BLOOD (ROUTINE X 2)     Status: Normal (Preliminary result)   Collection Time   01/22/11 10:07 PM      Component Value Range Status Comment   Specimen Description BLOOD RIGHT ARM   Final    Special Requests BOTTLES DRAWN AEROBIC AND ANAEROBIC 10CC   Final    Setup Time [id number]   Final    Culture     Final    Value:        BLOOD CULTURE RECEIVED NO GROWTH TO DATE CULTURE WILL BE HELD FOR 5 DAYS BEFORE ISSUING A FINAL NEGATIVE REPORT   Report Status PENDING   Incomplete   CULTURE, BLOOD (ROUTINE X 2)     Status: Normal (Preliminary result)   Collection Time   01/22/11 10:15 PM   Component Value Range Status Comment   Specimen Description BLOOD RIGHT HAND   Final    Special Requests BOTTLES DRAWN AEROBIC AND ANAEROBIC 10CC   Final    Setup Time [id number]   Final    Culture     Final    Value:        BLOOD CULTURE RECEIVED NO GROWTH TO DATE CULTURE WILL BE HELD FOR 5 DAYS BEFORE ISSUING A FINAL NEGATIVE REPORT   Report Status PENDING   Incomplete   URINE CULTURE     Status: Normal   Collection Time   01/23/11  7:00 AM      Component Value Range Status Comment   Specimen Description URINE, RANDOM   Final    Special Requests NONE   Final    Setup Time [id number]   Final    Colony Count NO GROWTH   Final    Culture NO GROWTH   Final    Report Status 01/24/2011 FINAL   Final   URINE CULTURE     Status: Normal   Collection Time   01/24/11  3:13 PM      Component Value Range Status Comment   Specimen Description URINE, CLEAN CATCH   Final    Special Requests NONE   Final  Setup Time [id number]   Final    Colony Count NO GROWTH   Final    Culture NO GROWTH   Final    Report Status 01/25/2011 FINAL   Final   MRSA PCR SCREENING     Status: Normal   Collection Time   01/26/11  5:35 AM      Component Value Range Status Comment   MRSA by PCR NEGATIVE  NEGATIVE  Final   CULTURE, BLOOD (ROUTINE X 2)     Status: Normal (Preliminary result)   Collection Time   01/26/11  9:00 AM      Component Value Range Status Comment   Specimen Description BLOOD   Final    Special Requests     Final    Value: BOTTLES DRAWN AEROBIC AND ANAEROBIC 5CC FROM TRIPLE LUMEN CATH LT IJ   Setup Time [id number]   Final    Culture     Final    Value:        BLOOD CULTURE RECEIVED NO GROWTH TO DATE CULTURE WILL BE HELD FOR 5 DAYS BEFORE ISSUING A FINAL NEGATIVE REPORT   Report Status PENDING   Incomplete   CULTURE, BLOOD (ROUTINE X 2)     Status: Normal (Preliminary result)   Collection Time   01/26/11 10:00 AM      Component Value Range Status Comment   Specimen Description  BLOOD LEFT ARM   Final    Special Requests BOTTLES DRAWN AEROBIC AND ANAEROBIC 5CC   Final    Setup Time [id number]   Final    Culture     Final    Value:        BLOOD CULTURE RECEIVED NO GROWTH TO DATE CULTURE WILL BE HELD FOR 5 DAYS BEFORE ISSUING A FINAL NEGATIVE REPORT   Report Status PENDING   Incomplete   CULTURE, ROUTINE-ABSCESS     Status: Normal (Preliminary result)   Collection Time   01/26/11  3:00 PM      Component Value Range Status Comment   Specimen Description ABSCESS GALL BLADDER   Final    Special Requests PATIENT ON FOLLOWING VANCOMYCIN ZOSYN   Final    Gram Stain PENDING   Incomplete    Culture Culture reincubated for better growth   Final    Report Status PENDING   Incomplete   CULTURE, ROUTINE-ABSCESS     Status: Normal (Preliminary result)   Collection Time   01/26/11  8:37 PM      Component Value Range Status Comment   Specimen Description ABSCESS GALL BLADDER FLUID   Final    Special Requests NONE   Final    Gram Stain     Final    Value: ABUNDANT WBC PRESENT, PREDOMINANTLY PMN     NO SQUAMOUS EPITHELIAL CELLS SEEN     ABUNDANT GRAM VARIABLE ROD   Culture Culture reincubated for better growth   Final    Report Status PENDING   Incomplete      Studies/Results: Ct Guided Abscess Drain  01/26/2011  *RADIOLOGY REPORT*  Indication: Acute cholecystitis, poor surgical candidate  CT GUIDED CHOLECYSTOMY TUBE PLACEMENT  Comparison: CT of the abdomen and pelvis - 01/25/2011  Medications: Conscious sedation was achieved intravenous Fentanyl and Versed.  The patient is admitted to the hospital and currently on intravenous antibiotics.  Total Moderate Sedation time: 40 minutes  Contrast: None  Complications: None immediate  Technique / Findings:  Informed written consent was obtained from the patient's white after  a discussion of the risks, benefits and alternatives to treatment.  Review of the preprocedural CT demonstrates multiple loops of interposed small bowel nearly  surrounding the gallbladder fossa.  As such, the decision was made to perform the procedure with CT guidance.  The patient was placed supine, slightly left lateral decubitus on the CT gantry and a pre procedural CT was performed demonstrating a moderate amount of perihepatic fluid within distended, thick-walled gallbladder.  The procedure was planned.   A timeout was performed prior to the initiation of the procedure.  The right lateral upper abdomen prepped and draped in the usual sterile fashion.   The overlying soft tissues were anesthetized with 1% lidocaine with epinephrine. Appropriate transhepatic trajectory was planned with a 22 gauge spinal needle.  Ultimately, the gallbladder fossa was accessed with an Accustick needle with a combination of intermittent CT fluoroscopic guidance and ultimately intermittent CT images.  An 0.018 wire was coiled within the gallbladder fossa and appropriate positioning was confirmed with a limited CT.  The track was dilated with the Accustick set, ultimately allowing placement of a 10.2 [place] all-purpose drainage catheter into the gallbladder over a short Amplatz stiff wire.  A postprocedural scan was performed demonstrating appropriate positioning within the gallbladder and was negative for complication.  The catheter was connected to a drainage bag, secured in place with suture and a dressing was placed. Approximately 20 ml of bile was aspirated and sent to the lab.  The patient tolerated the procedure well without immediate postprocedural complication.  Impression:  Successful CT guided placement of a 10.2 [place] all purpose drain into the gallbladder.  Samples were sent to the laboratory as requested by the ordering clinical team.  Original Report Authenticated By: Rachel Moulds, M.D.   Dg Chest Port 1 View  01/28/2011  *RADIOLOGY REPORT*  Clinical Data: Check endotracheal tube position.  PORTABLE CHEST - 1 VIEW  Comparison: 01/27/2011  Findings: Cardiomediastinal  silhouette is stable.  There is worsening in aeration with mild congestion/edema.  Persistent small right pleural effusion with right basilar atelectasis or infiltrate.  Drainage catheter in the right upper quadrant of the abdomen is stable.  NG tube in place.  No endotracheal tube is identified.  Stable left IJ central line position  IMPRESSION:  There is worsening in aeration with mild congestion/edema. Persistent small right pleural effusion with right basilar atelectasis or infiltrate.  Drainage catheter in the right upper quadrant of the abdomen is stable.  NG tube in place.  No endotracheal tube is identified.  Original Report Authenticated By: Lahoma Crocker, M.D.   Dg Chest Port 1 View  01/27/2011  *RADIOLOGY REPORT*  Clinical Data: Productive cough, evaluate for pneumonia  PORTABLE CHEST - 1 VIEW  Comparison: 01/26/2011; 01/24/2011; 01/22/2011; 11/27/2009  Findings: Unchanged enlarged cardiac silhouette and mediastinal contours.  Stable position of support apparatus.  There is persistent elevation of the right hemidiaphragm with blunting of the right costophrenic angles and right basilar heterogeneous opacities.  Minimal left basilar heterogeneous opacities.  No pneumothorax.  Cholecystostomy tube overlies the right upper abdominal quadrant.  Unchanged bones.  IMPRESSION: 1.  Persistent elevation of the right hemidiaphragm with small right-sided effusion and right basilar opacities, possibly atelectasis though underlying infection is not excluded. 2. Minimal left basilar heterogeneous opacities, possibly atelectasis  Original Report Authenticated By: Rachel Moulds, M.D.    Anti-infectives: Anti-infectives     Start     Dose/Rate Route Frequency Ordered Stop   01/27/11 1200  fluconazole (DIFLUCAN) IVPB 400 mg        400 mg 200 mL/hr over 60 Minutes Intravenous Every 24 hours 01/26/11 0940     01/26/11 2200   vancomycin (VANCOCIN) 750 mg in sodium chloride 0.9 % 150 mL IVPB        750 mg 150  mL/hr over 60 Minutes Intravenous Every 12 hours 01/26/11 0934     01/26/11 1200   piperacillin-tazobactam (ZOSYN) IVPB 4.5 g  Status:  Discontinued        4.5 g 200 mL/hr over 30 Minutes Intravenous 4 times per day 01/26/11 0850 01/26/11 0929   01/26/11 1100   fluconazole (DIFLUCAN) IVPB 800 mg        800 mg 400 mL/hr over 60 Minutes Intravenous  Once 01/26/11 0938 01/26/11 1200   01/26/11 1000   vancomycin (VANCOCIN) 1,500 mg in sodium chloride 0.9 % 500 mL IVPB        1,500 mg 250 mL/hr over 120 Minutes Intravenous  Once 01/26/11 0933 01/26/11 1521   01/26/11 0930   piperacillin-tazobactam (ZOSYN) IVPB 3.375 g        3.375 g 12.5 mL/hr over 240 Minutes Intravenous 3 times per day 01/26/11 0929     01/26/11 0900   fluconazole (DIFLUCAN) IVPB 800 mg  Status:  Discontinued        800 mg 400 mL/hr over 60 Minutes Intravenous Every 24 hours 01/26/11 0850 01/26/11 0935   01/26/11 0900   vancomycin (VANCOCIN) IVPB 1000 mg/200 mL premix  Status:  Discontinued     Comments: STAT      1,000 mg 200 mL/hr over 60 Minutes Intravenous Every 12 hours 01/26/11 0850 01/26/11 0932   01/25/11 1030   ertapenem (INVANZ) 1 g in sodium chloride 0.9 % 50 mL IVPB  Status:  Discontinued        1 g 100 mL/hr over 30 Minutes Intravenous Every 24 hours 01/25/11 0952 01/26/11 0850   01/24/11 1000   azithromycin (ZITHROMAX) 500 mg in dextrose 5 % 250 mL IVPB  Status:  Discontinued        500 mg 250 mL/hr over 60 Minutes Intravenous Every 24 hours 01/24/11 0840 01/24/11 0945   01/24/11 0930   cefTRIAXone (ROCEPHIN) 1 g in dextrose 5 % 50 mL IVPB  Status:  Discontinued        1 g 100 mL/hr over 30 Minutes Intravenous Every 24 hours 01/24/11 0840 01/24/11 0945          Assessment/Plan: S/p percutaneous cholecystostomy 12/15; check final cx's; cont current treatment.    LOS: 6 days    ALLRED,D [facility] 01/28/2011

## 2011-01-29 ENCOUNTER — Inpatient Hospital Stay (HOSPITAL_COMMUNITY): Payer: Medicare Other

## 2011-01-29 DIAGNOSIS — A419 Sepsis, unspecified organism: Secondary | ICD-10-CM

## 2011-01-29 DIAGNOSIS — K819 Cholecystitis, unspecified: Secondary | ICD-10-CM

## 2011-01-29 DIAGNOSIS — J9600 Acute respiratory failure, unspecified whether with hypoxia or hypercapnia: Secondary | ICD-10-CM

## 2011-01-29 LAB — CBC
MCH: 29.7 pg (ref 26.0–34.0)
MCHC: 33.6 g/dL (ref 30.0–36.0)
MCV: 88.4 fL (ref 78.0–100.0)
Platelets: 241 10*3/uL (ref 150–400)
RDW: 14.8 % (ref 11.5–15.5)

## 2011-01-29 LAB — COMPREHENSIVE METABOLIC PANEL
AST: 24 U/L (ref 0–37)
Albumin: 2.3 g/dL — ABNORMAL LOW (ref 3.5–5.2)
Alkaline Phosphatase: 66 U/L (ref 39–117)
Chloride: 108 mEq/L (ref 96–112)
Potassium: 3.2 mEq/L — ABNORMAL LOW (ref 3.5–5.1)
Total Bilirubin: 0.8 mg/dL (ref 0.3–1.2)

## 2011-01-29 LAB — CULTURE, BLOOD (ROUTINE X 2): Culture  Setup Time: 201212120404

## 2011-01-29 LAB — DIFFERENTIAL
Basophils Absolute: 0 10*3/uL (ref 0.0–0.1)
Eosinophils Absolute: 0.3 10*3/uL (ref 0.0–0.7)
Eosinophils Relative: 2 % (ref 0–5)

## 2011-01-29 MED ORDER — POTASSIUM CHLORIDE 10 MEQ/100ML IV SOLN
10.0000 meq | INTRAVENOUS | Status: AC
  Administered 2011-01-29 (×2): 10 meq via INTRAVENOUS
  Filled 2011-01-29 (×6): qty 100

## 2011-01-29 MED ORDER — METOPROLOL TARTRATE 1 MG/ML IV SOLN
5.0000 mg | INTRAVENOUS | Status: DC
  Administered 2011-01-29 – 2011-02-01 (×16): 5 mg via INTRAVENOUS
  Filled 2011-01-29 (×21): qty 5

## 2011-01-29 MED ORDER — METOPROLOL TARTRATE 1 MG/ML IV SOLN
5.0000 mg | Freq: Four times a day (QID) | INTRAVENOUS | Status: DC | PRN
  Administered 2011-01-29: 5 mg via INTRAVENOUS
  Filled 2011-01-29: qty 5

## 2011-01-29 MED ORDER — FENTANYL CITRATE 0.05 MG/ML IJ SOLN
25.0000 ug | INTRAMUSCULAR | Status: DC | PRN
  Administered 2011-01-29 – 2011-02-01 (×10): 25 ug via INTRAVENOUS
  Administered 2011-02-01: 50 ug via INTRAVENOUS
  Administered 2011-02-01 (×3): 25 ug via INTRAVENOUS
  Administered 2011-02-02 – 2011-02-04 (×4): 50 ug via INTRAVENOUS
  Filled 2011-01-29 (×21): qty 2

## 2011-01-29 MED ORDER — POTASSIUM CHLORIDE 10 MEQ/100ML IV SOLN
10.0000 meq | INTRAVENOUS | Status: AC
  Administered 2011-01-29 (×2): 10 meq via INTRAVENOUS
  Filled 2011-01-29 (×2): qty 100

## 2011-01-29 NOTE — Progress Notes (Signed)
Physical Therapy Evaluation Patient Details Name: Gary [name] MRN: [id number] DOB: [birth year] Today's Date: 01/29/2011  Problem List:  Patient Active Problem List  Diagnoses  . HYPERTENSION  . CAD  . DIVERTICULOSIS OF COLON  . DIVERTICULOSIS, COLON, WITH HEMORRHAGE  . RECTAL BLEEDING  . PERSONAL HISTORY OF COLONIC POLYPS  . Abdominal bloating  . Loose stools  . Ileus  . Dyslipidemia  . SBO (small bowel obstruction)  . Cholecystitis, acute    Past Medical History:  Past Medical History  Diagnosis Date  . Previous back surgery 1997  . Hypertension   . Hemorrhoids   . Chronic kidney disease   . Kidney stones   . Dyslipidemia   . Diverticulosis   . Colon polyps   . Angina   . Heart attack 07/2009  . Blood transfusion   . Anemia   . GI bleeding after 07/2009    "triggered by Plavix & ASA; from my diverticulosis"  . GERD (gastroesophageal reflux disease)   . Headache     occasionally  . Anxiety   . Arthritis     "hips and lower back"  . Chronic back pain greater than 3 months duration    Past Surgical History:  Past Surgical History  Procedure Date  . Eye surgery 1942  . Back surgery 1997  . Colon surgery     COLONOSCOPY  . Subtotal colectomy 01/24/2010    PT Assessment/Plan/Recommendation PT Assessment Clinical Impression Statement: Pt is a 77 y/o male admitted for small bowel obstruction.  Pt limited due to overall fatigue and strength.  Pt will benefit from acute PT services to improve overall mobility, endurance and strength to prepare for safe d/c to next venue. PT Recommendation/Assessment: Patient will need skilled PT in the acute care venue PT Problem List: Decreased strength;Decreased activity tolerance;Decreased balance;Decreased mobility;Decreased coordination;Decreased knowledge of use of DME PT Therapy Diagnosis : Difficulty walking;Generalized weakness PT Plan PT Frequency: Min 3X/week PT Treatment/Interventions: Gait training;DME  instruction;Stair training;Functional mobility training;Therapeutic activities;Therapeutic exercise;Balance training;Patient/family education PT Recommendation Recommendations for Other Services: OT consult Follow Up Recommendations: Inpatient Rehab Equipment Recommended: Rolling walker with 5" wheels;3 in 1 bedside comode PT Goals  Acute Rehab PT Goals PT Goal Formulation: With patient Time For Goal Achievement: 2 weeks Pt will go Supine/Side to Sit: with supervision PT Goal: Supine/Side to Sit - Progress: Progressing toward goal Pt will go Sit to Supine/Side: with supervision PT Goal: Sit to Supine/Side - Progress: Progressing toward goal Pt will go Sit to Stand: with supervision PT Goal: Sit to Stand - Progress: Progressing toward goal Pt will go Stand to Sit: with supervision PT Goal: Stand to Sit - Progress: Progressing toward goal Pt will Transfer Bed to Chair/Chair to Bed: with supervision PT Transfer Goal: Bed to Chair/Chair to Bed - Progress: Progressing toward goal Pt will Ambulate: >150 feet;with rolling walker;with supervision PT Goal: Ambulate - Progress: Progressing toward goal Pt will Go Up / Down Stairs: 1-2 stairs PT Goal: Up/Down Stairs - Progress: Progressing toward goal  PT Evaluation Precautions/Restrictions  Precautions Precautions: Fall Restrictions Weight Bearing Restrictions: No Prior Functioning  Home Living Lives With: Spouse Receives Help From: Family Type of Home: House Home Layout: One level Home Access: Stairs to enter Entrance Stairs-Rails: None Entrance Stairs-Number of Steps: 1 Bathroom Shower/Tub: Chiropodist: Standard Bathroom Accessibility: Yes How Accessible: Accessible via walker Home Adaptive Equipment: None Prior Function Level of Independence: Independent with gait;Independent with transfers;Independent with basic ADLs Able to Take Stairs?:  Yes Driving: Yes Vocation: Part time  employment Cognition Cognition Arousal/Alertness: Awake/alert Overall Cognitive Status: Appears within functional limits for tasks assessed Orientation Level: Oriented X4 Sensation/Coordination   Extremity Assessment RLE Assessment RLE Assessment: Within Functional Limits LLE Assessment LLE Assessment: Within Functional Limits Mobility (including Balance) Bed Mobility Bed Mobility: Yes Supine to Sit: 4: Min assist;HOB elevated (Comment degrees) (30 degrees) Supine to Sit Details (indicate cue type and reason): (A) with LE OOB and trunk OOB.  Cues for technique and safety. Transfers Transfers: Yes Sit to Stand: 4: Min assist;From elevated surface;With armrests;From bed Sit to Stand Details (indicate cue type and reason): (A) to initiate transfer with cues for hand and LE placement.   Stand to Sit: 4: Min assist;To chair/3-in-1;With armrests Stand to Sit Details: (A) to slowly descend to recliner with tactile cues for hand placement. Ambulation/Gait Ambulation/Gait: Yes Ambulation/Gait Assistance: 4: Min assist Ambulation/Gait Assistance Details (indicate cue type and reason): (A) to maintain balance.  Pt able to side step 5' with RW.  VCs for LE placement and manual (A) with RW. Ambulation Distance (Feet): 5 Feet Assistive device: Rolling walker Gait Pattern: Decreased step length - right;Decreased step length - left;Shuffle Stairs: No  Balance Balance Assessed: Yes Static Sitting Balance Static Sitting - Balance Support: No upper extremity supported Static Sitting - Level of Assistance: 5: Stand by assistance Static Sitting - Comment/# of Minutes: Supervision for safety to sit EOB for 5 minutes to prepare for transfers Exercise    End of Session PT - End of Session Equipment Utilized During Treatment: Gait belt Activity Tolerance: Patient limited by fatigue Patient left: in chair;with call bell in reach Nurse Communication: Mobility status for transfers General Behavior  During Session: [facility] for tasks performed Cognition: [facility] for tasks performed  Kharisma Glasner 01/29/2011, 1:35 PM [phone]

## 2011-01-29 NOTE — Progress Notes (Signed)
Pt may benefit from OT consult and rehab consult.  Will continue to follow.  Thanks!!! [place], [place] DPT  [phone]

## 2011-01-29 NOTE — Progress Notes (Signed)
Pt heart rate elevated in the 150's while working with PT to transfer from bed to the chair.  Pt heart rate returned to baseline once sitting.  Pt reports being able to tell his heart was beating faster.  MD has been made aware. Gae Gallop RN

## 2011-01-29 NOTE — Progress Notes (Signed)
Subjective: Chole drain placed 12/15 Draining well Pt feels some better  Objective: Vital signs in last 24 hours: Temp:  [97.6 F (36.4 C)-99.7 F (37.6 C)] 97.9 F (36.6 C) (12/18 0515) Pulse Rate:  [47-62] 58  (12/18 0515) Resp:  [18-26] 20  (12/18 0515) BP: (138-155)/(56-76) 146/75 mmHg (12/18 0515) SpO2:  [93 %-99 %] 95 % (12/18 0515) Weight:  [189 lb 6 oz (85.9 kg)-198 lb 3.1 oz (89.9 kg)] 189 lb 6 oz (85.9 kg) (12/18 0515) Last BM Date: 01/28/11  Intake/Output from previous day: 12/17 0701 - 12/18 0700 In: 1541.3 [I.V.:1078.3; NG/GT:30; IV Piggyback:433] Out: [id number] [Urine:4600; Emesis/NG output:575; Drains:100; Stool:350] Intake/Output this shift:    PE:  Drain intact Output 100 cc 12/17-Bile 30cc in bag now Site clean and dry; NT  WBC remains high at 17   Lab Results:   [facility] 01/29/11 0550 01/28/11 0442  WBC 17.0* 16.6*  HGB 14.6 12.8*  HCT 43.4 38.3*  PLT 241 188   BMET  Basename 01/29/11 0550 01/28/11 0442  NA 146* 149*  K 3.2* 3.5  CL 108 120*  CO2 23 21  GLUCOSE 137* 117*  BUN 54* 55*  CREATININE 1.72* 1.56*  CALCIUM 8.6 8.5   PT/INR  Basename 01/27/11 0455  LABPROT 18.9*  INR 1.55*   ABG No results found for this basename: PHART:2,PCO2:2,PO2:2,HCO3:2 in the last 72 hours  Studies/Results: Dg Chest Port 1 View  01/29/2011  *RADIOLOGY REPORT*  Clinical Data: History of weakness and hypertension.  History of removal of endotracheal tube.  PORTABLE CHEST - 1 VIEW  Comparison: 01/28/2011.  Findings: Tip of left internal jugular venous catheter terminates in the superior vena cava.  No pneumothorax is evident.  Tip of enteric tube terminates in the proximal stomach area.  There is stable moderate enlargement of the cardiac silhouette.  There is elevation of the right hemidiaphragmatic margin with minimal right basilar atelectasis.  There is interval decrease in the amount of atelectasis in the right base since the prior examination.  There  is improved aeration of the left lung.  There is interval decrease in the vascular congestion pattern.  No pleural effusion is evident.  IMPRESSION: Venous catheter and enteric tube are in place.  Stable moderate enlargement of the cardiac silhouette is seen.  Improved aeration in the right lung base with residual atelectasis.  Improved aeration in the left lung with decrease in vascular congestion pattern.  No pleural effusion or new lesion is evident.  Original Report Authenticated By: Delane Ginger, M.D.   Dg Chest Port 1 View  01/28/2011  *RADIOLOGY REPORT*  Clinical Data: Check endotracheal tube position.  PORTABLE CHEST - 1 VIEW  Comparison: 01/27/2011  Findings: Cardiomediastinal silhouette is stable.  There is worsening in aeration with mild congestion/edema.  Persistent small right pleural effusion with right basilar atelectasis or infiltrate.  Drainage catheter in the right upper quadrant of the abdomen is stable.  NG tube in place.  No endotracheal tube is identified.  Stable left IJ central line position  IMPRESSION:  There is worsening in aeration with mild congestion/edema. Persistent small right pleural effusion with right basilar atelectasis or infiltrate.  Drainage catheter in the right upper quadrant of the abdomen is stable.  NG tube in place.  No endotracheal tube is identified.  Original Report Authenticated By: Lahoma Crocker, M.D.    Anti-infectives: Anti-infectives     Start     Dose/Rate Route Frequency Ordered Stop   01/27/11 1200   fluconazole (DIFLUCAN)  IVPB 400 mg  Status:  Discontinued        400 mg 200 mL/hr over 60 Minutes Intravenous Every 24 hours 01/26/11 0940 01/28/11 1234   01/26/11 2200   vancomycin (VANCOCIN) 750 mg in sodium chloride 0.9 % 150 mL IVPB  Status:  Discontinued        750 mg 150 mL/hr over 60 Minutes Intravenous Every 12 hours 01/26/11 0934 01/28/11 1234   01/26/11 1200   piperacillin-tazobactam (ZOSYN) IVPB 4.5 g  Status:  Discontinued        4.5  g 200 mL/hr over 30 Minutes Intravenous 4 times per day 01/26/11 0850 01/26/11 0929   01/26/11 1100   fluconazole (DIFLUCAN) IVPB 800 mg        800 mg 400 mL/hr over 60 Minutes Intravenous  Once 01/26/11 0938 01/26/11 1200   01/26/11 1000   vancomycin (VANCOCIN) 1,500 mg in sodium chloride 0.9 % 500 mL IVPB        1,500 mg 250 mL/hr over 120 Minutes Intravenous  Once 01/26/11 0933 01/26/11 1521   01/26/11 0930   piperacillin-tazobactam (ZOSYN) IVPB 3.375 g        3.375 g 12.5 mL/hr over 240 Minutes Intravenous 3 times per day 01/26/11 0929     01/26/11 0900   fluconazole (DIFLUCAN) IVPB 800 mg  Status:  Discontinued        800 mg 400 mL/hr over 60 Minutes Intravenous Every 24 hours 01/26/11 0850 01/26/11 0935   01/26/11 0900   vancomycin (VANCOCIN) IVPB 1000 mg/200 mL premix  Status:  Discontinued     Comments: STAT      1,000 mg 200 mL/hr over 60 Minutes Intravenous Every 12 hours 01/26/11 0850 01/26/11 0932   01/25/11 1030   ertapenem (INVANZ) 1 g in sodium chloride 0.9 % 50 mL IVPB  Status:  Discontinued        1 g 100 mL/hr over 30 Minutes Intravenous Every 24 hours 01/25/11 0952 01/26/11 0850   01/24/11 1000   azithromycin (ZITHROMAX) 500 mg in dextrose 5 % 250 mL IVPB  Status:  Discontinued        500 mg 250 mL/hr over 60 Minutes Intravenous Every 24 hours 01/24/11 0840 01/24/11 0945   01/24/11 0930   cefTRIAXone (ROCEPHIN) 1 g in dextrose 5 % 50 mL IVPB  Status:  Discontinued        1 g 100 mL/hr over 30 Minutes Intravenous Every 24 hours 01/24/11 0840 01/24/11 0945          Assessment/Plan: s/p Procedure(s): LAPAROSCOPIC CHOLECYSTECTOMY WITH INTRAOPERATIVE CHOLANGIOGRAM  Chole drain placed 12/15 will follow Need to stay in for 6-8 weeks   Athena Baltz A 01/29/2011

## 2011-01-29 NOTE — Progress Notes (Signed)
Pt's HR 199 .  C/O feeling weak.  141/100, p154.  Dr. Earnest Conroy informed.  Instructed will notify Dr. Chauncey Cruel. Minor.  Will cont. To monitor.  Pt's primary nurse made aware.

## 2011-01-29 NOTE — Progress Notes (Signed)
Follow up - Critical Care Medicine Note  Patient Details:    Gary [name] is an 77 y.o. male with PMH of CAD, diveritcular bleed, and colectomy.  CT of abdomen with gallbladder thickening and on 12/15 he became confused, tachycardic, and was transfer to SDU.  Perc chole drain placed by IR. Course complicated by AF / RVR requiring amio - now stopped due to bradycardia  Lines, Airways, Drains: 12/15 Left IJ CVL >>> 12/15 Biliary Drainage Tube >>> 12/15 NG/OG tube >>>out 12/15 Rectal pouch >>> 12/15 Foley Catheter >>>out  Anti-infectives:  12/15 Fluconazole >>>12/17 12/15 Vancomycin >>>12/17 12/15 Zosyn >>>  Other Medications/IVF: 12/15 Amiodarone >>>12/17 brady  Microbiology: 12/11 Blood Culture x2 >>> No growth 12/13 Urine Culture >>> Negative 12/15 MRSA Screening >>> Negative 12/15 Abscess Culture >>> G neg rods >>  Best Practice/Protocols:  PE/DVT: Lovenox GI:  Protonix  Events/Test: 12/14 CT abdomen with dilated gallbladder with thickened wall.  No definite gallstone.  Suggestive of acute cholecysitis  RLL atelectasis and possible pneumonia with small right effusion Perihepatic fluid Small bowel distention may reflect ileus or partial SBO to the point of anatomosis with remaining segment of colon in pelvis 12/15 Biliary tube placement for drainage and acute cholecystitis 12/15 Transfer to PCCM for further care and Left IJ CVP placement, severe sepsis  RADIOGRAPHIC DATA: 12/18 c x r: IMPRESSION: Venous catheter and enteric tube are in place. Stable moderate enlargement of the cardiac silhouette is seen. Improved aeration in the right lung base with residual atelectasis. Improved aeration in the left lung with decrease in vascular congestion pattern. No pleural effusion or new lesion is evident.   HR jumps to 150s & sustained on transfer from bed to chair, PT note appreciated  Objective:  Vital signs for last 24 hours: Temp:  [97.6 F (36.4 C)-98.7 F (37.1 C)] 97.9  F (36.6 C) (12/18 0515) Pulse Rate:  [51-64] 64  (12/18 1158) Resp:  [18-26] 20  (12/18 0515) BP: (138-155)/(56-76) 140/60 mmHg (12/18 1158) SpO2:  [93 %-99 %] 95 % (12/18 0515) Weight:  [189 lb 6 oz (85.9 kg)-198 lb 3.1 oz (89.9 kg)] 189 lb 6 oz (85.9 kg) (12/18 0515)     Intake/Output from previous day: 12/17 0701 - 12/18 0700 In: 1541.3 [I.V.:1078.3; NG/GT:30; IV Piggyback:433] Out: [id number] [Urine:4600; Emesis/NG output:575; Drains:100; Stool:350]    Physical Exam:  General: Well developed male in mild pain lying in bed but not in any acute distress HEENT: NCAT, no JVD, no LAD, no discharge, EOMI, Lungs:  CTAB anteriorly, no adventitious sounds CVS:  RRR, s1 and s2 heard, no murmur/rub/gallop Abdomen: Bowel sound not appreciated   Distended stomach, tender to palpation, and guard - increase from yesterday Extremities: Intact, (+) pulses and equal, no cyanosis, no edema Neuro:  AAOx3, in mild discomfort due to pain   Assessment/Plan:  Severe Sepsis Resolved Lactic Acid 1.3, Procalcitonin 6.67 Continue current empiric zosyn >> follow G neg rods in bile cx, WC slow rise   Afib with RVR  Amiodarone discontinued, Use low dose lopressor prn Doubt Amiodarone needs at this point, patient has no prior history of A. Fib,  transient due to sepsis? Echo>>nml LV fn, mild increased RVSP, tsh nml  Post Drainage Placement for Acute Cholecystitis. Per ccs Biliary tube drainage now drain billous with mix blood discharge Nasal gastric tube to suction    Mild Respiratory Failure On [place] and NOT oxygen dependent at baseline bibasal atx Maintain IS, upright position  Hypernatremia and Hypokalemia BMET  Component Value Date/Time   NA 146* 01/29/2011 0550   K 3.2* 01/29/2011 0550   CL 108 01/29/2011 0550   CO2 23 01/29/2011 0550   GLUCOSE 137* 01/29/2011 0550   BUN 54* 01/29/2011 0550   CREATININE 1.72* 01/29/2011 0550   CALCIUM 8.6 01/29/2011 0550   GFRNONAA 37* 01/29/2011 0550    GFRAA 43* 01/29/2011 0550     Hypokalemia replete and magnesium level is 2.2 (WNL) Will continue to monitor and replace as needed Maintain IVF with now d5w, goal equal balance, dc lasix   Pain  - dc dilaudid, use fentanyl instead  12/18 plan to transfer to triad in am 12/19 , triad aware    LOS: 7 days   Richardson Landry Minor ACNP Maryanna Shape PCCM Pager [phone] till 3 pm If no answer page [phone]  [facility] V.  01/29/2011, 1:41 PM

## 2011-01-29 NOTE — Progress Notes (Signed)
Subjective: Feels ok. Pain decreased.  Objective: Vital signs in last 24 hours: Temp:  [97.6 F (36.4 C)-99.7 F (37.6 C)] 97.9 F (36.6 C) (12/18 0515) Pulse Rate:  [47-62] 58  (12/18 0515) Resp:  [18-26] 20  (12/18 0515) BP: (138-155)/(56-76) 146/75 mmHg (12/18 0515) SpO2:  [93 %-99 %] 95 % (12/18 0515) Weight:  [85.9 kg (189 lb 6 oz)-89.9 kg (198 lb 3.1 oz)] 189 lb 6 oz (85.9 kg) (12/18 0515) Last BM Date: 01/28/11  Intake/Output this shift:    Physical Exam: BP 146/75  Pulse 58  Temp(Src) 97.9 F (36.6 C) (Oral)  Resp 20  Ht 5\' 8"  (1.727 m)  Wt 85.9 kg (189 lb 6 oz)  BMI 28.79 kg/m2  SpO2 95% Abdomen: soft, ND, mildly tender RUQ, no mass Drain intact, bilious output  Labs: CBC  Basename 01/29/11 0550 01/28/11 0442  WBC 17.0* 16.6*  HGB 14.6 12.8*  HCT 43.4 38.3*  PLT 241 188   BMET  Basename 01/29/11 0550 01/28/11 0442  NA 146* 149*  K 3.2* 3.5  CL 108 120*  CO2 23 21  GLUCOSE 137* 117*  BUN 54* 55*  CREATININE 1.72* 1.56*  CALCIUM 8.6 8.5   LFT  Basename 01/29/11 0550  PROT 5.9*  ALBUMIN 2.3*  AST 24  ALT 20  ALKPHOS 66  BILITOT 0.8  BILIDIR --  IBILI --  LIPASE --   PT/INR  Basename 01/27/11 0455  LABPROT 18.9*  INR 1.55*   ABG No results found for this basename: PHART:2,PCO2:2,PO2:2,HCO3:2 in the last 72 hours  Studies/Results: Dg Chest Port 1 View  01/29/2011  *RADIOLOGY REPORT*  Clinical Data: History of weakness and hypertension.  History of removal of endotracheal tube.  PORTABLE CHEST - 1 VIEW  Comparison: 01/28/2011.  Findings: Tip of left internal jugular venous catheter terminates in the superior vena cava.  No pneumothorax is evident.  Tip of enteric tube terminates in the proximal stomach area.  There is stable moderate enlargement of the cardiac silhouette.  There is elevation of the right hemidiaphragmatic margin with minimal right basilar atelectasis.  There is interval decrease in the amount of atelectasis in the  right base since the prior examination.  There is improved aeration of the left lung.  There is interval decrease in the vascular congestion pattern.  No pleural effusion is evident.  IMPRESSION: Venous catheter and enteric tube are in place.  Stable moderate enlargement of the cardiac silhouette is seen.  Improved aeration in the right lung base with residual atelectasis.  Improved aeration in the left lung with decrease in vascular congestion pattern.  No pleural effusion or new lesion is evident.  Original Report Authenticated By: Delane Ginger, M.D.   Dg Chest Port 1 View  01/28/2011  *RADIOLOGY REPORT*  Clinical Data: Check endotracheal tube position.  PORTABLE CHEST - 1 VIEW  Comparison: 01/27/2011  Findings: Cardiomediastinal silhouette is stable.  There is worsening in aeration with mild congestion/edema.  Persistent small right pleural effusion with right basilar atelectasis or infiltrate.  Drainage catheter in the right upper quadrant of the abdomen is stable.  NG tube in place.  No endotracheal tube is identified.  Stable left IJ central line position  IMPRESSION:  There is worsening in aeration with mild congestion/edema. Persistent small right pleural effusion with right basilar atelectasis or infiltrate.  Drainage catheter in the right upper quadrant of the abdomen is stable.  NG tube in place.  No endotracheal tube is identified.  Original Report Authenticated  By: Julien Girt POP, M.D.    Assessment: Principal Problem:  *SBO (small bowel obstruction) Active Problems:  HYPERTENSION  CAD  Ileus  Dyslipidemia  Cholecystitis, acute   Plan: COntinue NGT, bowel rest Perc chole drain. Abx, follow WBC  LOS: 7 days    Gary [name] 01/29/2011

## 2011-01-29 NOTE — Progress Notes (Signed)
Pt heart rate sustaining in the 120-130's consistently, peaking into the 150-160's at times.  Pt reports being able to feel heart beating fast.  PRN order of Metoprolol given IV.  Pt heart rate currently in the 80-low 100's.  MD has been made aware. Gae Gallop RN

## 2011-01-30 ENCOUNTER — Inpatient Hospital Stay (HOSPITAL_COMMUNITY): Payer: Medicare Other

## 2011-01-30 DIAGNOSIS — R5381 Other malaise: Secondary | ICD-10-CM

## 2011-01-30 LAB — CULTURE, ROUTINE-ABSCESS

## 2011-01-30 LAB — BASIC METABOLIC PANEL
Calcium: 8.7 mg/dL (ref 8.4–10.5)
GFR calc non Af Amer: 40 mL/min — ABNORMAL LOW (ref >90)
Glucose, Bld: 133 mg/dL — ABNORMAL HIGH (ref 70–99)
Sodium: 144 mEq/L (ref 135–145)

## 2011-01-30 LAB — CBC
Hemoglobin: 15.5 g/dL (ref 13.0–17.0)
MCH: 30.5 pg (ref 26.0–34.0)
MCHC: 34.8 g/dL (ref 30.0–36.0)
Platelets: 265 10*3/uL (ref 150–400)

## 2011-01-30 LAB — PHOSPHORUS: Phosphorus: 3.4 mg/dL (ref 2.3–4.6)

## 2011-01-30 MED ORDER — DEXTROSE 5 % IV SOLN
1.0000 g | INTRAVENOUS | Status: DC
  Administered 2011-01-30 – 2011-02-03 (×5): 1 g via INTRAVENOUS
  Filled 2011-01-30 (×5): qty 10

## 2011-01-30 MED ORDER — POTASSIUM CHLORIDE 10 MEQ/50ML IV SOLN
10.0000 meq | INTRAVENOUS | Status: AC
  Administered 2011-01-30 (×3): 10 meq via INTRAVENOUS
  Filled 2011-01-30 (×3): qty 50

## 2011-01-30 NOTE — Progress Notes (Signed)
Follow up - Critical Care Medicine Note  Patient Details:    Gary [name] is an 77 y.o. male with PMH of CAD, diveritcular bleed, and colectomy.  CT of abdomen with gallbladder thickening and on 12/15 he became confused, tachycardic, and was transfer to SDU.  Perc chole drain placed by IR. Course complicated by AF / RVR requiring amio - now stopped due to bradycardia  Lines, Airways, Drains: 12/15 Left IJ CVL >>> 12/15 Biliary Drainage Tube >>> 12/15 NG/OG tube >>>out 12/15 Rectal pouch >>> 12/15 Foley Catheter >>>out  Anti-infectives:  12/15 Fluconazole >>>12/17 12/15 Vancomycin >>>12/17 12/15 Zosyn >>>12/19 12/ 19 ceftx >>  Other Medications/IVF: 12/15 Amiodarone >>>12/17 brady  Microbiology: 12/11 Blood Culture x2 >>> No growth 12/13 Urine Culture >>> Negative 12/15 MRSA Screening >>> Negative 12/15 Abscess Culture >>> G neg rods >>klebs  S to cipro/ ceftx/ cefazolin  Best Practice/Protocols:  PE/DVT: Lovenox GI:  Protonix  Events/Test: 12/14 CT abdomen with dilated gallbladder with thickened wall.  No definite gallstone.  Suggestive of acute cholecysitis  RLL atelectasis and possible pneumonia with small right effusion Perihepatic fluid Small bowel distention may reflect ileus or partial SBO to the point of anatomosis with remaining segment of colon in pelvis 12/15 Biliary tube placement for drainage and acute cholecystitis 12/15 Transfer to PCCM for further care and Left IJ CVP placement, severe sepsis 12/18 >>AF/ RVR requiring pushes of lopressor, breakthrough pain  RADIOGRAPHIC DATA: 12/18 c x r: IMPRESSION: Venous catheter and enteric tube are in place. Stable moderate enlargement of the cardiac silhouette is seen. Improved aeration in the right lung base with residual atelectasis. Improved aeration in the left lung with decrease in vascular congestion pattern. No pleural effusion or new lesion is evident.   HR jumps to 150s & sustained on transfer from bed to  chair, rehab note appreciated  Objective:  Vital signs for last 24 hours: Temp:  [97.4 F (36.3 C)-98.9 F (37.2 C)] 98 F (36.7 C) (12/19 0650) Pulse Rate:  [58-66] 58  (12/19 0650) Resp:  [18-20] 20  (12/19 0650) BP: (132-157)/(60-86) 157/86 mmHg (12/19 0650) SpO2:  [92 %-96 %] 92 % (12/19 0650) Weight:  [80.3 kg (177 lb 0.5 oz)] 177 lb 0.5 oz (80.3 kg) (12/19 0702)     Intake/Output from previous day: 12/18 0701 - 12/19 0700 In: 1400 [P.O.:960; I.V.:440] Out: 3475 [Urine:2800; Emesis/NG output:450; Drains:50; Stool:175]    Physical Exam:  General: Well developed male in mild pain lying in bed but not in any acute distress HEENT: NCAT, no JVD, no LAD, no discharge, EOMI, Lungs:  CTAB anteriorly, no adventitious sounds CVS:  RRR, s1 and s2 heard, no murmur/rub/gallop Abdomen: Bowel sound not appreciated   Distended stomach, tender to palpation, and guard - increase from yesterday Extremities: Intact, (+) pulses and equal, no cyanosis, no edema Neuro:  AAOx3, in mild discomfort due to pain   Assessment/Plan:  Severe Sepsis Resolved Lactic Acid 1.3, Procalcitonin 6.67 Dc  Zosyn, use ceftriaxone for klebsiella  Afib with RVR  Amiodarone discontinued, Use  lopressor 5mg  q 4h (with hold paramters) >> good response on tele Doubt Amiodarone needs at this point, patient has no prior history of A. Fib,  transient due to sepsis? Echo>>nml LV fn, mild increased RVSP, tsh nml  Post Drainage Placement for Acute Cholecystitis. Per ccs Biliary tube drainage now drain billous with mix blood discharge Nasal gastric tube to suction Resume diet & advance, if OK with surgery    Mild Respiratory  Failure On [place] and NOT oxygen dependent at baseline bibasal atx improved on CXR Maintain IS, upright position  Hypernatremia and Hypokalemia BMET    Component Value Date/Time   NA 144 01/30/2011 0535   K 3.1* 01/30/2011 0535   CL 107 01/30/2011 0535   CO2 25 01/30/2011 0535    GLUCOSE 133* 01/30/2011 0535   BUN 53* 01/30/2011 0535   CREATININE 1.60* 01/30/2011 0535   CALCIUM 8.7 01/30/2011 0535   GFRNONAA 40* 01/30/2011 0535   GFRAA 47* 01/30/2011 0535     Hypokalemia replete and magnesium level is 2.2 (WNL) Will continue to monitor and replace as needed Maintain IVF with now d5w, goal equal balance, dc lasix   Pain  - dc dilaudid, use fentanyl instead  12/18 plan to transfer to triad in am 12/19 , triad aware    LOS: 8 days   PCCM to sign off  Precious Segall V.  01/30/2011, 8:22 AM

## 2011-01-30 NOTE — Progress Notes (Signed)
Occupational Therapy Evaluation Patient Details Name: Gary [name] MRN: [id number] DOB: March 20, 1932 Today's Date: 01/30/2011  Problem List:  Patient Active Problem List  Diagnoses  . HYPERTENSION  . CAD  . DIVERTICULOSIS OF COLON  . DIVERTICULOSIS, COLON, WITH HEMORRHAGE  . RECTAL BLEEDING  . PERSONAL HISTORY OF COLONIC POLYPS  . Abdominal bloating  . Loose stools  . Ileus  . Dyslipidemia  . SBO (small bowel obstruction)  . Cholecystitis, acute    Past Medical History:  Past Medical History  Diagnosis Date  . Previous back surgery 1997  . Hypertension   . Hemorrhoids   . Chronic kidney disease   . Kidney stones   . Dyslipidemia   . Diverticulosis   . Colon polyps   . Angina   . Heart attack 07/2009  . Blood transfusion   . Anemia   . GI bleeding after 07/2009    "triggered by Plavix & ASA; from my diverticulosis"  . GERD (gastroesophageal reflux disease)   . Headache     occasionally  . Anxiety   . Arthritis     "hips and lower back"  . Chronic back pain greater than 3 months duration    Past Surgical History:  Past Surgical History  Procedure Date  . Eye surgery 1942  . Back surgery 1997  . Colon surgery     COLONOSCOPY  . Subtotal colectomy 01/24/2010    OT Assessment/Plan/Recommendation OT Assessment Clinical Impression Statement: Pt. will benefit from OT to increase functional independence with ADLs and get pt. to supervision level at D/C OT Recommendation/Assessment: Patient will need skilled OT in the acute care venue OT Problem List: Decreased strength;Decreased activity tolerance;Impaired balance (sitting and/or standing);Decreased safety awareness;Decreased knowledge of use of DME or AE;Decreased knowledge of precautions;Cardiopulmonary status limiting activity Barriers to Discharge: None OT Therapy Diagnosis : Generalized weakness OT Plan OT Frequency: Min 2X/week OT Treatment/Interventions: Self-care/ADL training;Energy  conservation;Therapeutic activities;Patient/family education;Balance training OT Recommendation Recommendations for Other Services: Rehab consult Follow Up Recommendations: Inpatient Rehab Equipment Recommended: Rolling walker with 5" wheels;3 in 1 bedside comode Individuals Consulted Consulted and Agree with Results and Recommendations: Patient;Family member/caregiver Family Member Consulted: Wife OT Goals Acute Rehab OT Goals OT Goal Formulation: With patient/family Time For Goal Achievement: 2 weeks ADL Goals Pt Will Perform Grooming: with set-up;with supervision;Standing at sink ADL Goal: Grooming - Progress: Progressing toward goals Pt Will Perform Lower Body Bathing: with set-up;Sit to stand from bed (with AE) ADL Goal: Lower Body Bathing - Progress: Not met Pt Will Perform Lower Body Dressing: with set-up;Unsupported;Sit to stand from bed;with adaptive equipment ADL Goal: Lower Body Dressing - Progress: Progressing toward goals Pt Will Transfer to Toilet: with supervision;3-in-1;Stand pivot transfer ADL Goal: Toilet Transfer - Progress: Progressing toward goals Pt Will Perform Toileting - Hygiene: with set-up;Leaning right and/or left on 3-in-1/toilet ADL Goal: Toileting - Hygiene - Progress: Progressing toward goals Additional ADL Goal #1: Pt. will recall 2 energy conservation techniques to use with ADLs ADL Goal: Additional Goal #1 - Progress: Progressing toward goals  OT Evaluation Precautions/Restrictions  Precautions Precautions: Fall Restrictions Weight Bearing Restrictions: No Prior Functioning Home Living Lives With: Spouse Receives Help From: Family Type of Home: House Home Layout: One level Home Access: Stairs to enter Entrance Stairs-Rails: None Entrance Stairs-Number of Steps: 1 Bathroom Shower/Tub: Chiropodist: Standard Bathroom Accessibility: Yes How Accessible: Accessible via walker Home Adaptive Equipment: None Prior  Function Level of Independence: Independent with gait;Independent with transfers;Independent with basic ADLs Able  to Take Stairs?: Yes Driving: Yes Vocation: Part time employment ADL ADL Eating/Feeding: Performed;Set up Where Assessed - Eating/Feeding: Chair Grooming: Performed;Wash/dry hands;Wash/dry face;Set up Where Assessed - Grooming: Unsupported;Sitting, bed Upper Body Bathing: Simulated;Chest;Right arm;Left arm;Abdomen;Minimal assistance Where Assessed - Upper Body Bathing: Sitting, bed Lower Body Bathing: Simulated;Maximal assistance Where Assessed - Lower Body Bathing: Sit to stand from bed Upper Body Dressing: Performed;Minimal assistance Upper Body Dressing Details (indicate cue type and reason): With donning gown Where Assessed - Upper Body Dressing: Sitting, bed Lower Body Dressing: Performed;+1 Total assistance Lower Body Dressing Details (indicate cue type and reason): With donning bilateral socks due to pt. unable to bend forward due to abdominal discomfort Where Assessed - Lower Body Dressing: Sit to stand from bed Toilet Transfer: Simulated;Moderate assistance Toilet Transfer Details (indicate cue type and reason): Mod verbal cues for hand placement on RW and bed to facilitate transfer Toilet Transfer Method: Stand pivot Toilet Transfer Equipment: Other (comment) (straight back chair) Toileting - Clothing Manipulation: Simulated;Moderate assistance Toileting - Clothing Manipulation Details (indicate cue type and reason): With moving gown Where Assessed - Toileting Clothing Manipulation: Standing Toileting - Hygiene: Performed;+1 Total assistance Toileting - Hygiene Details (indicate cue type and reason): Pt. incontinent of bowels and provided hygiene assist due to unable to twist to reach backside because of bilateral UE dependency on RW Where Assessed - Toileting Hygiene: Standing Tub/Shower Transfer: Not assessed Tub/Shower Transfer Method: Not assessed Equipment  Used: Rolling walker ADL Comments: Pt. with decreased activity tolerance, however very motivated for OOB activity. Pt. educated on D/C plan for increased rehab prior to d/c home. Pt. will benefit from further education on AE for LB ADLs due to pt. unable to reach LB. Pt. activity limited due to NG tube with wall suction and pt. with increased pain in abdomen. Vision/Perception  Vision - History Baseline Vision: Wears glasses all the time Patient Visual Report: No change from baseline Vision - Assessment Eye Alignment: Within Functional Limits Vision Assessment: Vision not tested Cognition Cognition Arousal/Alertness: Awake/alert Overall Cognitive Status: Appears within functional limits for tasks assessed Orientation Level: Oriented X4 Sensation/Coordination   Extremity Assessment RUE Assessment RUE Assessment: Within Functional Limits LUE Assessment LUE Assessment: Within Functional Limits Mobility  Bed Mobility Bed Mobility: Yes Supine to Sit: 4: Min assist;HOB elevated (Comment degrees) Supine to Sit Details (indicate cue type and reason): Mod verbal cues and assist for bilateral LE off EOB Transfers Transfers: Yes Sit to Stand: 3: Mod assist;With upper extremity assist;From bed (from regular surface bed) Sit to Stand Details (indicate cue type and reason): Mod verbal cues for hand placement and use of RW Exercises   End of Session OT - End of Session Equipment Utilized During Treatment: Gait belt Activity Tolerance: Patient tolerated treatment well Patient left: in chair;with call bell in reach;with family/visitor present Nurse Communication: Mobility status for transfers General Behavior During Session: [facility] for tasks performed Cognition: [facility] for tasks performed   Kingsley Herandez, OTR/L Pager [phone] 01/30/2011, 9:50 AM

## 2011-01-30 NOTE — Progress Notes (Signed)
WBC continueing to rise. Would check u/a, c diff

## 2011-01-30 NOTE — Consult Note (Signed)
Physical Medicine and Rehabilitation Consult Reason for Consult: Acute cholecystitis with deconditioning Referring Phsyician: Critical care medicine Gary [name] is an 77 y.o. male.   HPI: 77 year old right handed white male with history of coronary artery disease with PTCA, prior history of diverticular bleed requiring a colectomy. Admitted December 11 with increased abdominal pain x4 days with associated nausea but no vomiting and numerous episodes of diarrhea. Abdominal x-ray showed suspicion of small bowel obstruction. CT of the abdomen showed dilated gallbladder with thickened gallbladder wall no definite gallstones suspicious for acute cholecystitis. Underwent placement of percutaneous cholecystostomy drain December 15 by interventional radiology. Hospital course complicated by acute renal failure with creatinine 1.88 as well as atrial fibrillation. Placed on amiodarone since discontinued due to bradycardia and change to low-dose Lopressor as needed as patient had no documented history of atrial fibrillation in the past. Subcutaneous Lovenox was initiated for deep vein thrombosis prophylaxis. Patient remains on him. Zosyn for questionable sepsis. A nasogastric tube had remained in place for deep suction as biliary tube was now draining some bilious and mixed blood. Physical therapy evaluation completed on December 18 as patient was ambulating 5 feet with minimal assistance and a rolling walker with limited endurance. Physical medicine and rehabilitation was consulted at the request of physical therapy to consider inpatient rehabilitation services Review of Systems  Constitutional: Positive for malaise/fatigue. Negative for fever.  Eyes: Negative for blurred vision.  Respiratory: Negative for cough and shortness of breath.   Cardiovascular: Negative for chest pain.  Gastrointestinal: Positive for diarrhea. Negative for nausea and vomiting.       Rectal tube  Genitourinary: Negative for dysuria.    Musculoskeletal: Negative.   Skin: Negative.   Neurological: Negative for dizziness and headaches.  Psychiatric/Behavioral: Negative.    Past Medical History  Diagnosis Date  . Previous back surgery 1997  . Hypertension   . Hemorrhoids   . Chronic kidney disease   . Kidney stones   . Dyslipidemia   . Diverticulosis   . Colon polyps   . Angina   . Heart attack 07/2009  . Blood transfusion   . Anemia   . GI bleeding after 07/2009    "triggered by Plavix & ASA; from my diverticulosis"  . GERD (gastroesophageal reflux disease)   . Headache     occasionally  . Anxiety   . Arthritis     "hips and lower back"  . Chronic back pain greater than 3 months duration    Past Surgical History  Procedure Date  . Eye surgery 1942  . Back surgery 1997  . Colon surgery     COLONOSCOPY  . Subtotal colectomy 01/24/2010   Family History  Problem Relation Age of Onset  . Heart disease Father   . Colon cancer Neg Hx   . Pancreatic cancer Mother    Social History:  reports that he has never smoked. He has never used smokeless tobacco. He reports that he drinks alcohol. He reports that he does not use illicit drugs. Allergies:  Allergies  Allergen Reactions  . Clopidogrel Bisulfate Other (See Comments)    H/O diverticulitis; prone to rectal bleeding.  Plavix complicated this.  jkl  . Sulfonamide Derivatives Hives  . Naproxen Rash   Medications Prior to Admission  Medication Dose Route Frequency Provider Last Rate Last Dose  . 0.9 %  sodium chloride infusion   Intravenous Once Maudry Diego, MD 125 mL/hr at 01/22/11 [phone]    . acetaminophen (TYLENOL) suppository 650  mg  650 mg Rectal Q6H PRN Grace Bushy Minor, NP   650 mg at 01/26/11 1200  . albuterol (PROVENTIL) (5 MG/ML) 0.5% nebulizer solution 2.5 mg  2.5 mg Nebulization Q2H PRN Shanker Ghimire   2.5 mg at 01/26/11 1642  . amiodarone (CORDARONE) 1.8 mg/mL load via infusion 150 mg  150 mg Intravenous Once Mary A. Lynch 333 mL/hr at  01/26/11 0538 150 mg at 01/26/11 0538   And  . amiodarone (CORDARONE) 450 mg in dextrose 5 % 250 mL infusion  60 mg/hr Intravenous Continuous Mary A. Lynch 33.3 mL/hr at 01/26/11 0700 60 mg/hr at 01/26/11 0700   And  . amiodarone (CORDARONE) 450 mg in dextrose 5 % 250 mL infusion  30 mg/hr Intravenous Continuous Thomas J Bice 16.7 mL/hr at 01/27/11 1002 30 mg/hr at 01/27/11 1002  . dextrose 5 % solution   Intravenous Continuous Lavon Paganini. Feinstein 40 mL/hr at 01/30/11 0200    . diphenhydrAMINE (BENADRYL) injection 25 mg  25 mg Intravenous Once Mary A. Lynch   25 mg at 01/26/11 0508  . enoxaparin (LOVENOX) injection 40 mg  40 mg Subcutaneous Q24H Shanker Ghimire   40 mg at 01/29/11 2135  . fentaNYL (SUBLIMAZE) 0.05 MG/ML injection           . fentaNYL (SUBLIMAZE) injection 25-50 mcg  25-50 mcg Intravenous Q3H PRN Rakesh V. Elsworth Soho, MD   25 mcg at 01/30/11 0335  . fentaNYL (SUBLIMAZE) injection   Intravenous PRN Sandi Mariscal   25 mcg at 01/26/11 1515  . fluconazole (DIFLUCAN) IVPB 800 mg  800 mg Intravenous Once [employer], PHARMD   800 mg at 01/26/11 1100  . fluticasone (FLONASE) 50 MCG/ACT nasal spray 2 spray  2 spray Each Nare Daily Shanker Ghimire   2 spray at 01/29/11 1200  . hydrALAZINE (APRESOLINE) injection 5 mg  5 mg Intravenous Q6H PRN Mutaz A Elmahi      . HYDROmorphone (DILAUDID) injection 1 mg  1 mg Intravenous Once Maudry Diego, MD   1 mg at 01/22/11 1202  . HYDROmorphone (DILAUDID) injection 1 mg  1 mg Intravenous Once Maudry Diego, MD   1 mg at 01/22/11 1500  . iohexol (OMNIPAQUE) 300 MG/ML solution 20 mL  20 mL Oral Q1 Hr x 2 Medication Radiologist      . iohexol (OMNIPAQUE) 300 MG/ML solution 70 mL  70 mL Intravenous Once PRN Medication Radiologist   70 mL at 01/25/11 1413  . metoprolol (LOPRESSOR) 1 MG/ML injection           . metoprolol (LOPRESSOR) injection 5 mg  5 mg Intravenous Once Mary A. Lynch   5 mg at 01/26/11 0400  . metoprolol (LOPRESSOR) injection 5 mg  5  mg Intravenous Once Mary A. Lynch   5 mg at 01/26/11 0647  . metoprolol (LOPRESSOR) injection 5 mg  5 mg Intravenous Q4H Asencion Noble, MD   5 mg at 01/30/11 0148  . midazolam (VERSED) 2 MG/2ML injection           . midazolam (VERSED) 5 MG/5ML injection   Intravenous PRN John Watts   0.5 mg at 01/26/11 1515  . morphine 4 MG/ML injection 4 mg  4 mg Intravenous Once Maudry Diego, MD   4 mg at 01/22/11 0939  . morphine 4 MG/ML injection        4 mg at 01/22/11 1106  . ondansetron (ZOFRAN) injection 4 mg  4 mg Intravenous Once Maudry Diego,  MD   4 mg at 01/22/11 0939  . pantoprazole (PROTONIX) injection 40 mg  40 mg Intravenous Q12H Mutaz A Elmahi   40 mg at 01/29/11 2136  . piperacillin-tazobactam (ZOSYN) IVPB 3.375 g  3.375 g Intravenous Q8H Gary J. Feinstein   3.375 g at 01/29/11 2136  . potassium chloride 10 mEq in 100 mL IVPB  10 mEq Intravenous Q1 Hr x 4 William S Minor, NP   10 mEq at 01/29/11 1802  . potassium chloride 10 mEq in 100 mL IVPB  10 mEq Intravenous Q1 Hr x 2 Forrest Moron, NP   10 mEq at 01/29/11 2242  . potassium chloride 10 mEq in 50 mL *CENTRAL LINE* IVPB  10 mEq Intravenous Q1 Hr x 2 Lavon Paganini. Feinstein   10 mEq at 01/27/11 1802  . potassium chloride 10 mEq in 50 mL *CENTRAL LINE* IVPB  10 mEq Intravenous Q1 Hr x 2 Lavon Paganini. Feinstein   10 mEq at 01/28/11 1500  . potassium chloride SA (K-DUR,KLOR-CON) CR tablet 40 mEq  40 mEq Oral Once Mutaz A Elmahi   40 mEq at 01/23/11 1830  . potassium chloride SA (K-DUR,KLOR-CON) CR tablet 40 mEq  40 mEq Oral Once Mutaz A Elmahi   40 mEq at 01/24/11 1002  . promethazine (PHENERGAN) injection 12.5 mg  12.5 mg Intravenous Q6H PRN Shanker Ghimire      . sodium chloride 0.9 % bolus 1,000 mL  1,000 mL Intravenous Once Karen Chafe Molpus, MD   1,000 mL at 01/21/11 0615  . sodium chloride 0.9 % bolus 1,000 mL  1,000 mL Intravenous Once Karen Chafe Molpus, MD   1,000 mL at 01/21/11 0823  . sodium chloride 0.9 % injection           . sodium chloride  0.9 % injection        5 mL at 01/27/11 0600  . vancomycin (VANCOCIN) 1,500 mg in sodium chloride 0.9 % 500 mL IVPB  1,500 mg Intravenous Once [employer], PHARMD   1,500 mg at 01/26/11 1321  . white petrolatum (VASELINE) gel           . DISCONTD: 0.45 % sodium chloride infusion   Intravenous Continuous Lavon Paganini. Feinstein 75 mL/hr at 01/28/11 1031    . DISCONTD: 0.9 %  sodium chloride infusion   Intravenous Continuous Earnstine Regal, PA 125 mL/hr at 01/25/11 1400    . DISCONTD: 0.9 %  sodium chloride infusion   Intravenous Continuous Grace Bushy Minor, NP 20 mL/hr at 01/26/11 1830    . DISCONTD: acetaminophen (TYLENOL) tablet 650 mg  650 mg Oral Q4H PRN Anastassia Doutova   650 mg at 01/24/11 0821  . DISCONTD: alum & mag hydroxide-simeth (MAALOX/MYLANTA) 200-200-20 MG/5ML suspension 30 mL  30 mL Oral Q6H PRN Shanker Ghimire      . DISCONTD: amiodarone (CORDARONE,NEXTERONE) IV bolus only 150 mg/100 mL  150 mg Intravenous Once Grace Bushy Minor, NP      . DISCONTD: amiodarone (PACERONE) tablet 200 mg  200 mg Oral Daily Gwen Her Bice   200 mg at 01/27/11 2300  . DISCONTD: aspirin EC tablet 81 mg  81 mg Oral Daily Shanker Ghimire   81 mg at 01/24/11 1001  . DISCONTD: aspirin tablet 81 mg  81 mg Oral Daily Shanker Ghimire      . DISCONTD: azithromycin (ZITHROMAX) 500 mg in dextrose 5 % 250 mL IVPB  500 mg Intravenous Q24H Mutaz A Elmahi      .  DISCONTD: cefTRIAXone (ROCEPHIN) 1 g in dextrose 5 % 50 mL IVPB  1 g Intravenous Q24H Mutaz A Elmahi      . DISCONTD: citalopram (CELEXA) tablet 20 mg  20 mg Oral Daily Shanker Ghimire   20 mg at 01/24/11 1001  . DISCONTD: diltiazem (CARDIZEM) 1 mg/mL load via infusion 20 mg  20 mg Intravenous Once Mary A. Lynch      . DISCONTD: diltiazem (CARDIZEM) 100 mg in dextrose 5 % 100 mL infusion  5-15 mg/hr Intravenous Continuous Mary A. Lynch      . DISCONTD: diphenhydrAMINE (BENADRYL) capsule 25 mg  25 mg Oral Once Mary A. Lynch      . DISCONTD: ertapenem  (INVANZ) 1 g in sodium chloride 0.9 % 50 mL IVPB  1 g Intravenous Q24H Earnstine Regal, PA   1 g at 01/25/11 1042  . DISCONTD: fluconazole (DIFLUCAN) IVPB 400 mg  400 mg Intravenous Q24H Charmian Muff [place], PHARMD   400 mg at 01/27/11 1208  . DISCONTD: fluconazole (DIFLUCAN) IVPB 800 mg  800 mg Intravenous Q24H Lavon Paganini. Titus Mould      . DISCONTD: furosemide (LASIX) injection 40 mg  40 mg Intravenous Q12H Gary J. Feinstein   40 mg at 01/29/11 1200  . DISCONTD: gabapentin (NEURONTIN) capsule 400 mg  400 mg Oral TID Shanker Ghimire   400 mg at 01/24/11 1625  . DISCONTD: HYDROcodone-acetaminophen (NORCO) 5-325 MG per tablet 1-2 tablet  1-2 tablet Oral Q4H PRN Shanker Ghimire   2 tablet at 01/23/11 2307  . DISCONTD: HYDROmorphone (DILAUDID) injection 0.5 mg  0.5 mg Intravenous Q3H PRN Lavon Paganini. Feinstein   0.5 mg at 01/29/11 1315  . DISCONTD: HYDROmorphone (DILAUDID) injection 1 mg  1 mg Intravenous Q3H PRN Imogene Burn. Tsuei, MD   1 mg at 01/26/11 0249  . DISCONTD: lisinopril (PRINIVIL,ZESTRIL) tablet 10 mg  10 mg Oral Daily Shanker Ghimire   10 mg at 01/23/11 0943  . DISCONTD: LORazepam (ATIVAN) injection 1 mg  1 mg Intravenous Q6H PRN Mutaz A Elmahi   1 mg at 01/25/11 0452  . DISCONTD: LORazepam (ATIVAN) tablet 1-2 mg  1-2 mg Oral Q6H PRN Mutaz A Elmahi   1 mg at 01/24/11 1002  . DISCONTD: metoprolol (LOPRESSOR) injection 5 mg  5 mg Intravenous Q6H PRN Rakesh V. Elsworth Soho, MD   5 mg at 01/29/11 1647  . DISCONTD: morphine 2 MG/ML injection 2 mg  2 mg Intravenous Q4H PRN Shanker Ghimire   2 mg at 01/24/11 1219  . DISCONTD: nebivolol (BYSTOLIC) tablet 5 mg  5 mg Oral Daily Shanker Ghimire   5 mg at 01/23/11 0943  . DISCONTD: pantoprazole (PROTONIX) injection 40 mg  40 mg Intravenous Q24H Shanker Ghimire   40 mg at 01/23/11 2227  . DISCONTD: piperacillin-tazobactam (ZOSYN) IVPB 4.5 g  4.5 g Intravenous Q6H Gary J. Titus Mould      . DISCONTD: promethazine (PHENERGAN) tablet 12.5 mg  12.5 mg Oral Q6H PRN  Shanker Ghimire      . DISCONTD: simvastatin (ZOCOR) tablet 40 mg  40 mg Oral QHS Shanker Ghimire   40 mg at 01/23/11 2227  . DISCONTD: vancomycin (VANCOCIN) 750 mg in sodium chloride 0.9 % 150 mL IVPB  750 mg Intravenous Q12H Charmian Muff [place], PHARMD   750 mg at 01/28/11 1000  . DISCONTD: vancomycin (VANCOCIN) IVPB 1000 mg/200 mL premix  1,000 mg Intravenous Q12H Lavon Paganini. Titus Mould      . DISCONTD: zolpidem (AMBIEN) tablet 5 mg  5  mg Oral QHS PRN Shanker Ghimire   5 mg at 01/22/11 2308   Medications Prior to Admission  Medication Sig Dispense Refill  . acetaminophen (TYLENOL) 500 MG tablet Take 500 mg by mouth every 6 (six) hours as needed.        Marland Kitchen aspirin 81 MG tablet Take 81 mg by mouth daily.        . citalopram (CELEXA) 20 MG tablet Take 20 mg by mouth daily.        Marland Kitchen DOXAZOSIN MESYLATE PO Take 8 mg by mouth daily.        . fexofenadine (ALLEGRA) 180 MG tablet Take 180 mg by mouth daily.        . fluticasone (FLONASE) 50 MCG/ACT nasal spray 2 sprays by Nasal route daily.        Marland Kitchen gabapentin (NEURONTIN) 400 MG capsule Take 400 mg by mouth 3 (three) times daily.        Marland Kitchen HYDROcodone-acetaminophen (NORCO) 5-325 MG per tablet Take 1 tablet by mouth every 6 (six) hours as needed. For pain      . lisinopril (PRINIVIL,ZESTRIL) 10 MG tablet Take 10 mg by mouth daily.        . nebivolol (BYSTOLIC) 5 MG tablet Take 5 mg by mouth daily.        Marland Kitchen omeprazole (PRILOSEC) 20 MG capsule Take 1 capsule (20 mg total) by mouth daily.  30 capsule  1  . ondansetron (ZOFRAN ODT) 4 MG disintegrating tablet Take 1 tablet (4 mg total) by mouth every 8 (eight) hours as needed for nausea.  20 tablet  0  . simvastatin (ZOCOR) 40 MG tablet Take 40 mg by mouth at bedtime.        Marland Kitchen testosterone (ANDROGEL) 50 MG/5GM GEL Place 5 g onto the skin daily.        . traMADol-acetaminophen (ULTRACET) 37.5-325 MG per tablet Take 1 tablet by mouth every 6 (six) hours as needed.        . nitroGLYCERIN (NITROSTAT) 0.4 MG SL  tablet Place 0.4 mg under the tongue every 5 (five) minutes as needed.          Home: Home Living Lives With: Spouse Receives Help From: Family Type of Home: House Home Layout: One level Home Access: Stairs to enter Entrance Stairs-Rails: None Entrance Stairs-Number of Steps: 1 Bathroom Shower/Tub: Optometrist: Yes How Accessible: Accessible via walker Home Adaptive Equipment: None  Functional History: Prior Function Level of Independence: Independent with gait;Independent with transfers;Independent with basic ADLs Able to Take Stairs?: Yes Driving: Yes Vocation: Part time employment Functional Status:  Mobility: Bed Mobility Bed Mobility: Yes Supine to Sit: 4: Min assist;HOB elevated (Comment degrees) (30 degrees) Supine to Sit Details (indicate cue type and reason): (A) with LE OOB and trunk OOB.  Cues for technique and safety. Transfers Transfers: Yes Sit to Stand: 4: Min assist;From elevated surface;With armrests;From bed Sit to Stand Details (indicate cue type and reason): (A) to initiate transfer with cues for hand and LE placement.   Stand to Sit: 4: Min assist;To chair/3-in-1;With armrests Stand to Sit Details: (A) to slowly descend to recliner with tactile cues for hand placement. Ambulation/Gait Ambulation/Gait: Yes Ambulation/Gait Assistance: 4: Min assist Ambulation/Gait Assistance Details (indicate cue type and reason): (A) to maintain balance.  Pt able to side step 5' with RW.  VCs for LE placement and manual (A) with RW. Ambulation Distance (Feet): 5 Feet Assistive device: Rolling walker Gait Pattern:  Decreased step length - right;Decreased step length - left;Shuffle Stairs: No    ADL:    Cognition: Cognition Arousal/Alertness: Awake/alert Orientation Level: Oriented X4 Cognition Arousal/Alertness: Awake/alert Overall Cognitive Status: Appears within functional limits for tasks  assessed Orientation Level: Oriented X4  Blood pressure 143/84, pulse 66, temperature 97.4 F (36.3 C), temperature source Oral, resp. rate 18, height 5\' 8"  (1.727 m), weight 85.9 kg (189 lb 6 oz), SpO2 94.00%. Physical Exam  Constitutional: He is oriented to person, place, and time. He appears well-developed.  HENT:  Head: Normocephalic.  Neck: Normal range of motion. Neck supple. No thyromegaly present.  Cardiovascular: Regular rhythm.   Pulmonary/Chest: Effort normal. He has no wheezes.  Abdominal: He exhibits distension. There is no tenderness. There is no guarding.  Musculoskeletal: He exhibits no edema.  Neurological: He is alert and oriented to person, place, and time.  Skin: Skin is warm and dry.  Psychiatric: He has a normal mood and affect.  NG tube draining broth-pt is having his first meal with suction on and RN at bedside Motor 4/5 in BUE and BLE Sensation normal to lt touch in BUE and BLE GU-condom cath Rectal tube draining watery stool  Results for orders placed during the hospital encounter of 01/22/11 (from the past 24 hour(s))  COMPREHENSIVE METABOLIC PANEL     Status: Abnormal   Collection Time   01/29/11  5:50 AM      Component Value Range   Sodium 146 (*) 135 - 145 (mEq/L)   Potassium 3.2 (*) 3.5 - 5.1 (mEq/L)   Chloride 108  96 - 112 (mEq/L)   CO2 23  19 - 32 (mEq/L)   Glucose, Bld 137 (*) 70 - 99 (mg/dL)   BUN 54 (*) 6 - 23 (mg/dL)   Creatinine, Ser 1.72 (*) 0.50 - 1.35 (mg/dL)   Calcium 8.6  8.4 - 10.5 (mg/dL)   Total Protein 5.9 (*) 6.0 - 8.3 (g/dL)   Albumin 2.3 (*) 3.5 - 5.2 (g/dL)   AST 24  0 - 37 (U/L)   ALT 20  0 - 53 (U/L)   Alkaline Phosphatase 66  39 - 117 (U/L)   Total Bilirubin 0.8  0.3 - 1.2 (mg/dL)   GFR calc non Af Amer 37 (*) >90 (mL/min)   GFR calc Af Amer 43 (*) >90 (mL/min)  CBC     Status: Abnormal   Collection Time   01/29/11  5:50 AM      Component Value Range   WBC 17.0 (*) 4.0 - 10.5 (K/uL)   RBC 4.91  4.22 - 5.81  (MIL/uL)   Hemoglobin 14.6  13.0 - 17.0 (g/dL)   HCT 43.4  39.0 - 52.0 (%)   MCV 88.4  78.0 - 100.0 (fL)   MCH 29.7  26.0 - 34.0 (pg)   MCHC 33.6  30.0 - 36.0 (g/dL)   RDW 14.8  11.5 - 15.5 (%)   Platelets 241  150 - 400 (K/uL)  DIFFERENTIAL     Status: Abnormal   Collection Time   01/29/11  5:50 AM      Component Value Range   Neutrophils Relative 89 (*) 43 - 77 (%)   Neutro Abs 15.1 (*) 1.7 - 7.7 (K/uL)   Lymphocytes Relative 5 (*) 12 - 46 (%)   Lymphs Abs 0.9  0.7 - 4.0 (K/uL)   Monocytes Relative 4  3 - 12 (%)   Monocytes Absolute 0.7  0.1 - 1.0 (K/uL)   Eosinophils Relative 2  0 - 5 (%)   Eosinophils Absolute 0.3  0.0 - 0.7 (K/uL)   Basophils Relative 0  0 - 1 (%)   Basophils Absolute 0.0  0.0 - 0.1 (K/uL)  GLUCOSE, CAPILLARY     Status: Abnormal   Collection Time   01/29/11  6:27 AM      Component Value Range   Glucose-Capillary 138 (*) 70 - 99 (mg/dL)   Dg Chest Port 1 View  01/29/2011  *RADIOLOGY REPORT*  Clinical Data: History of weakness and hypertension.  History of removal of endotracheal tube.  PORTABLE CHEST - 1 VIEW  Comparison: 01/28/2011.  Findings: Tip of left internal jugular venous catheter terminates in the superior vena cava.  No pneumothorax is evident.  Tip of enteric tube terminates in the proximal stomach area.  There is stable moderate enlargement of the cardiac silhouette.  There is elevation of the right hemidiaphragmatic margin with minimal right basilar atelectasis.  There is interval decrease in the amount of atelectasis in the right base since the prior examination.  There is improved aeration of the left lung.  There is interval decrease in the vascular congestion pattern.  No pleural effusion is evident.  IMPRESSION: Venous catheter and enteric tube are in place.  Stable moderate enlargement of the cardiac silhouette is seen.  Improved aeration in the right lung base with residual atelectasis.  Improved aeration in the left lung with decrease in  vascular congestion pattern.  No pleural effusion or new lesion is evident.  Original Report Authenticated By: Delane Ginger, M.D.   Dg Chest Port 1 View  01/28/2011  *RADIOLOGY REPORT*  Clinical Data: Check endotracheal tube position.  PORTABLE CHEST - 1 VIEW  Comparison: 01/27/2011  Findings: Cardiomediastinal silhouette is stable.  There is worsening in aeration with mild congestion/edema.  Persistent small right pleural effusion with right basilar atelectasis or infiltrate.  Drainage catheter in the right upper quadrant of the abdomen is stable.  NG tube in place.  No endotracheal tube is identified.  Stable left IJ central line position  IMPRESSION:  There is worsening in aeration with mild congestion/edema. Persistent small right pleural effusion with right basilar atelectasis or infiltrate.  Drainage catheter in the right upper quadrant of the abdomen is stable.  NG tube in place.  No endotracheal tube is identified.  Original Report Authenticated By: Lahoma Crocker, M.D.    Assessment/Plan: Diagnosis: Deconditioning due to cholelithiasis and partial small bowel obstruction 1. Does the need for close, 24 hr/day medical supervision in concert with the patient's rehab needs make it unreasonable for this patient to be served in a less intensive setting? Yes 2. Co-Morbidities requiring supervision/potential complications: New onset atrial fibrillation, acute renal failure, urinary and bowel incontinence 3. Due to bladder management, bowel management, safety, skin/wound care, disease management and medication administration, does the patient require 24 hr/day rehab nursing? Yes 4. Does the patient require coordinated care of a physician, rehab nurse, PT (1-2 hrs/day, 5 days/week) and OT (1-2 hrs/day, 5 days/week) to address physical and functional deficits in the context of the above medical diagnosis(es)? Yes Addressing deficits in the following areas: balance, bathing, dressing, endurance, grooming,  locomotion, strength, toileting and transferring 5. Can the patient actively participate in an intensive therapy program of at least 3 hrs of therapy per day at least 5 days per week? Yes 6. The potential for patient to make measurable gains while on inpatient rehab is excellent 7. Anticipated functional outcomes upon discharge from inpatients are modified independent  mobility PT, modified independent ADLs OT, not applicable SLP 8. Estimated rehab length of stay to reach the above functional goals is: 1-2 weeks 9. Does the patient have adequate social supports to accommodate these discharge functional goals? Yes 10. Anticipated D/C setting: Home 11. Anticipated post D/C treatments: [place] therapy 12. Overall Rehab/Functional Prognosis: excellent  RECOMMENDATIONS: This patient's condition is appropriate for continued rehabilitative care in the following setting: CIR Patient has agreed to participate in recommended program. Yes Note that insurance prior authorization may be required for reimbursement for recommended care.  Comment: Needs to have nasogastric tube discontinued prior to CIR admission   Gary [name],Gary J. 01/30/2011

## 2011-01-30 NOTE — Progress Notes (Signed)
PCCM transfer  Gary [name] is an 77 y.o. male with PMH of CAD, hypertension, dyslipidemia diveritcular bleed, and colectomy was admitted on 01/22/2011 for abdominal pain. Initially he was assessed as having gastroenteritis versus ileus versus small bowel obstruction. Surgery is following. CT of abdomen with gallbladder thickening and on 12/15 he became confused, tachycardic, and was transfer to SDU. Perc chole drain placed by IR. Course complicated by AF / RVR requiring amio - now stopped due to bradycardia currently on scheduled IV metoprolol for this.      Subjective: Chart reviewed. Patient got some IV fentanyl and hour and a half ago and this somnolent but arousable. He says is fine and denies complaints. He has some chronic low back pain.  Objective: Blood pressure 145/89, pulse 58, temperature 98 F (36.7 C), temperature source Oral, resp. rate 20, height 5\' 8"  (1.727 m), weight 80.3 kg (177 lb 0.5 oz), SpO2 92.00%.  Intake/Output Summary (Last 24 hours) at 01/30/11 1200 Last data filed at 01/30/11 0836  Gross per 24 hour  Intake   1400 ml  Output   3475 ml  Net  -2075 ml   General exam: Lying supine in bed and in no obvious distress. Has an NG tube and condom catheter. Respiratory system: Clear anteriorly and slightly decreased breath sounds in the bases. Rest of the lung fields appear clear. No increased work of breathing. Cardiovascular system: Telemetry shows mostly sinus rhythm with PACs and 80. He had one short run of nonsustained supraventricular tachycardia. First and second heart sounds heard, regular. No JVD or murmurs. Abdomen nondistended, minimal diffuse tenderness but no rigidity guarding or rebound. Bowel sounds are normally heard. Central system: Somnolent but arousable and oriented to person and place. No focal deficits.  Lab Results: Basic Metabolic Panel:  Basename 01/30/11 0535 01/29/11 0550  NA 144 146*  K 3.1* 3.2*  CL 107 108  CO2 25 23  GLUCOSE  133* 137*  BUN 53* 54*  CREATININE 1.60* 1.72*  CALCIUM 8.7 8.6  MG 2.2 --  PHOS 3.4 --   Liver Function Tests:  Basename 01/29/11 0550 01/28/11 0442  AST 24 27  ALT 20 19  ALKPHOS 66 61  BILITOT 0.8 0.8  PROT 5.9* 5.1*  ALBUMIN 2.3* 1.9*   No results found for this basename: LIPASE:2,AMYLASE:2 in the last 72 hours No results found for this basename: AMMONIA:2 in the last 72 hours CBC:  Basename 01/30/11 0535 01/29/11 0550 01/28/11 0442  WBC 18.7* 17.0* --  NEUTROABS -- 15.1* 15.0*  HGB 15.5 14.6 --  HCT 44.5 43.4 --  MCV 87.4 88.4 --  PLT 265 241 --   CBG:  Basename 01/30/11 0637 01/29/11 0627 01/28/11 1748 01/28/11 1231 01/28/11 0845 01/27/11 2033  GLUCAP 140* 138* 110* 109* 119* 120*   Thyroid Function Tests:  Basename 01/28/11 1300  TSH 0.958  T4TOTAL --  FREET4 --  T3FREE --  THYROIDAB --   Urine Drug Screen: Drugs of Abuse     Component Value Date/Time   LABOPIA NONE DETECTED 08/09/2009 0521   COCAINSCRNUR NONE DETECTED 08/09/2009 0521   LABBENZ NONE DETECTED 08/09/2009 0521   AMPHETMU NONE DETECTED 08/09/2009 0521   THCU NONE DETECTED 08/09/2009 0521   LABBARB  Value: NONE DETECTED        DRUG SCREEN FOR MEDICAL PURPOSES ONLY.  IF CONFIRMATION IS NEEDED FOR ANY PURPOSE, NOTIFY LAB WITHIN 5 DAYS.        LOWEST DETECTABLE LIMITS FOR URINE DRUG SCREEN  Drug Class       Cutoff (ng/mL) Amphetamine      1000 Barbiturate      200 Benzodiazepine   [id number] Tricyclics       [id number] Opiates          300 Cocaine          300 THC              50 08/09/2009 0521     Micro Results: Recent Results (from the past 240 hour(s))  CULTURE, BLOOD (ROUTINE X 2)     Status: Normal   Collection Time   01/22/11 10:07 PM      Component Value Range Status Comment   Specimen Description BLOOD RIGHT ARM   Final    Special Requests BOTTLES DRAWN AEROBIC AND ANAEROBIC 10CC   Final    Setup Time [id number]   Final    Culture NO GROWTH 5 DAYS   Final    Report Status 01/29/2011 FINAL   Final    CULTURE, BLOOD (ROUTINE X 2)     Status: Normal   Collection Time   01/22/11 10:15 PM      Component Value Range Status Comment   Specimen Description BLOOD RIGHT HAND   Final    Special Requests BOTTLES DRAWN AEROBIC AND ANAEROBIC 10CC   Final    Setup Time [id number]   Final    Culture NO GROWTH 5 DAYS   Final    Report Status 01/29/2011 FINAL   Final   URINE CULTURE     Status: Normal   Collection Time   01/23/11  7:00 AM      Component Value Range Status Comment   Specimen Description URINE, RANDOM   Final    Special Requests NONE   Final    Setup Time [id number]   Final    Colony Count NO GROWTH   Final    Culture NO GROWTH   Final    Report Status 01/24/2011 FINAL   Final   URINE CULTURE     Status: Normal   Collection Time   01/24/11  3:13 PM      Component Value Range Status Comment   Specimen Description URINE, CLEAN CATCH   Final    Special Requests NONE   Final    Setup Time [id number]   Final    Colony Count NO GROWTH   Final    Culture NO GROWTH   Final    Report Status 01/25/2011 FINAL   Final   MRSA PCR SCREENING     Status: Normal   Collection Time   01/26/11  5:35 AM      Component Value Range Status Comment   MRSA by PCR NEGATIVE  NEGATIVE  Final   CULTURE, BLOOD (ROUTINE X 2)     Status: Normal (Preliminary result)   Collection Time   01/26/11  9:00 AM      Component Value Range Status Comment   Specimen Description BLOOD   Final    Special Requests     Final    Value: BOTTLES DRAWN AEROBIC AND ANAEROBIC 5CC FROM TRIPLE LUMEN CATH LT IJ   Setup Time [id number]   Final    Culture     Final    Value:        BLOOD CULTURE RECEIVED NO GROWTH TO DATE CULTURE WILL BE HELD FOR 5 DAYS BEFORE ISSUING A FINAL NEGATIVE REPORT   Report Status PENDING   Incomplete  CULTURE, BLOOD (ROUTINE X 2)     Status: Normal (Preliminary result)   Collection Time   01/26/11 10:00 AM      Component Value Range Status Comment   Specimen Description BLOOD LEFT ARM    Final    Special Requests BOTTLES DRAWN AEROBIC AND ANAEROBIC 5CC   Final    Setup Time [id number]   Final    Culture     Final    Value:        BLOOD CULTURE RECEIVED NO GROWTH TO DATE CULTURE WILL BE HELD FOR 5 DAYS BEFORE ISSUING A FINAL NEGATIVE REPORT   Report Status PENDING   Incomplete   CULTURE, ROUTINE-ABSCESS     Status: Normal (Preliminary result)   Collection Time   01/26/11  3:00 PM      Component Value Range Status Comment   Specimen Description ABSCESS GALL BLADDER   Final    Special Requests PATIENT ON FOLLOWING VANCOMYCIN ZOSYN   Final    Gram Stain     Final    Value: NO WBC SEEN     NO SQUAMOUS EPITHELIAL CELLS SEEN     FEW GRAM NEGATIVE RODS   Culture MULTIPLE ORGANISMS PRESENT, NONE PREDOMINANT   Final    Report Status PENDING   Incomplete   CULTURE, ROUTINE-ABSCESS     Status: Normal   Collection Time   01/26/11  8:37 PM      Component Value Range Status Comment   Specimen Description ABSCESS GALL BLADDER FLUID   Final    Special Requests NONE   Final    Gram Stain     Final    Value: ABUNDANT WBC PRESENT, PREDOMINANTLY PMN     NO SQUAMOUS EPITHELIAL CELLS SEEN     ABUNDANT GRAM VARIABLE ROD   Culture ABUNDANT KLEBSIELLA SPECIES   Final    Report Status 01/30/2011 FINAL   Final    Organism ID, Bacteria KLEBSIELLA SPECIES   Final     Studies/Results: Dg Chest 2 View  01/23/2011  *RADIOLOGY REPORT*  Clinical Data: Fever and upper abdominal pain  CHEST - 2 VIEW  Comparison: 06/27/2010  Findings: Shallow inspiration.  Infiltration or atelectasis in both lung bases, greater on the right.  This is new since the previous study may represent pneumonia.  Borderline heart size and pulmonary vascularity.  Calcified granulomas in the upper lungs.  The upper abdomen demonstrates bowel interposition in front of the liver and under the right hemidiaphragm.  Suggestion of mild gaseous distension of small bowel loops with air-fluid levels.  Small bowel obstruction should be  considered.  Abdominal films likely would be useful in further evaluation.  IMPRESSION: Infiltration or atelectasis in both lung bases, greater on the right, suggesting pneumonia.  Right upper quadrant suggest possible small bowel dilatation and air-fluid levels.  Obstruction should be excluded.  Original Report Authenticated By: Neale Burly, M.D.   Ct Guided Abscess Drain  01/26/2011  *RADIOLOGY REPORT*  Indication: Acute cholecystitis, poor surgical candidate  CT GUIDED CHOLECYSTOMY TUBE PLACEMENT  Comparison: CT of the abdomen and pelvis - 01/25/2011  Medications: Conscious sedation was achieved intravenous Fentanyl and Versed.  The patient is admitted to the hospital and currently on intravenous antibiotics.  Total Moderate Sedation time: 40 minutes  Contrast: None  Complications: None immediate  Technique / Findings:  Informed written consent was obtained from the patient's white after a discussion of the risks, benefits and alternatives to treatment.  Review of the preprocedural  CT demonstrates multiple loops of interposed small bowel nearly surrounding the gallbladder fossa.  As such, the decision was made to perform the procedure with CT guidance.  The patient was placed supine, slightly left lateral decubitus on the CT gantry and a pre procedural CT was performed demonstrating a moderate amount of perihepatic fluid within distended, thick-walled gallbladder.  The procedure was planned.   A timeout was performed prior to the initiation of the procedure.  The right lateral upper abdomen prepped and draped in the usual sterile fashion.   The overlying soft tissues were anesthetized with 1% lidocaine with epinephrine. Appropriate transhepatic trajectory was planned with a 22 gauge spinal needle.  Ultimately, the gallbladder fossa was accessed with an Accustick needle with a combination of intermittent CT fluoroscopic guidance and ultimately intermittent CT images.  An 0.018 wire was coiled within the  gallbladder fossa and appropriate positioning was confirmed with a limited CT.  The track was dilated with the Accustick set, ultimately allowing placement of a 10.2 [place] all-purpose drainage catheter into the gallbladder over a short Amplatz stiff wire.  A postprocedural scan was performed demonstrating appropriate positioning within the gallbladder and was negative for complication.  The catheter was connected to a drainage bag, secured in place with suture and a dressing was placed. Approximately 20 ml of bile was aspirated and sent to the lab.  The patient tolerated the procedure well without immediate postprocedural complication.  Impression:  Successful CT guided placement of a 10.2 [place] all purpose drain into the gallbladder.  Samples were sent to the laboratory as requested by the ordering clinical team.  Original Report Authenticated By: Rachel Moulds, M.D.   Ct Abdomen Pelvis W Contrast  01/25/2011  *RADIOLOGY REPORT*  Clinical Data: Abdominal distention, history of prior total colectomy, evaluate for small bowel obstruction  CT ABDOMEN AND PELVIS WITH CONTRAST  Technique:  Multidetector CT imaging of the abdomen and pelvis was performed following the standard protocol during bolus administration of intravenous contrast.  Contrast: 41mL OMNIPAQUE IOHEXOL 300 MG/ML IV SOLN  Comparison: Abdomen films from 01/25/2011  Findings: There is atelectasis of the right lower lobe with probable small effusion.  This may represent pneumonia or central endobronchial lesion.  Coronary artery calcifications are noted as well and there is cardiomegaly present.  There is a small amount of perihepatic fluid present.  The liver has a normal appearance with no focal abnormality.  However the gallbladder is abnormal being distended and very thick-walled. Although no definite gallstones are seen, acute cholecystitis is the primary consideration.  The pancreas appears fatty replaced. The adrenal glands and spleen are  unremarkable.  The stomach is filled with oral contrast and is unremarkable.  The kidneys enhance with no calculus or mass and no hydronephrosis is seen.  The abdominal aorta is normal in caliber with moderate atheromatous change present.  As noted on plain film there is some gaseous distention of small bowel in this patient with a history of colectomy.  The small bowel appears slightly prominent to the point of anastomosis in the mid abdomen with the remaining segment of rectosigmoid colon.  This small bowel distention may reflect ileus versus partial small bowel obstruction with stricture at the anastomotic site.  There is fluid within the remaining segment of colon.  Hardware for fusion of the lower lumbar spine is noted.  IMPRESSION:  1.  Dilated gallbladder with thickened gallbladder wall.  No definite gallstones.  These findings are highly suspicious for acute cholecystitis.  Correlate clinically. 2.  Right lower lobe atelectasis and possible pneumonia with small right effusion. 3.  Perihepatic fluid. 4.  Small bowel distention may reflect ileus or partial small bowel obstruction to the point of anastomosis with the remaining segment of colon in the pelvis.  Original Report Authenticated By: Joretta Bachelor, M.D.   Ct Abdomen Pelvis W Contrast  01/20/2011  *RADIOLOGY REPORT*  Clinical Data: Abdominal pain, nausea, vomiting, diarrhea.  History of colectomy.  CT ABDOMEN AND PELVIS WITH CONTRAST  Technique:  Multidetector CT imaging of the abdomen and pelvis was performed following the standard protocol during bolus administration of intravenous contrast.  Contrast: 14mL OMNIPAQUE IOHEXOL 300 MG/ML IV SOLN  Comparison: 01/19/2010  Findings: Infiltration or atelectasis in the right lung base with dependent atelectasis in the left lung base.  Calcified granulomas in the spleen.  The liver, gallbladder, pancreas, adrenal glands, kidneys, and retroperitoneal lymph nodes are unremarkable. Vascular calcifications in  the aorta without aneurysm.  Mild thickening of the wall of the distal esophagus possibly due to under distension or inflammatory esophagitis.  No free fluid or free air in the abdomen.  Prominent visceral adipose tissue.  Postoperative changes consistent with some total colectomy with ileal sigmoid anastomoses.  The  distal small bowel are mildly distended and filled with fluid and gas.  There is no wall thickening.  The anastomosis appears patent.  Changes appear to extend to the rectum, suggesting an ileus rather than obstruction. No obstructing lesion is visualized.  Pelvis:  Calcifications in the prostate gland.  No bladder wall thickening.  No free or loculated pelvic fluid collections.  No pelvic lymphadenopathy.  Postoperative changes and degenerative changes in the lumbar spine.  IMPRESSION: Postoperative changes with some total colectomy and ileal sigmoid anastomoses.  Mild distension with fluid filled small bowel loops extending to the anus.  Although anal obstruction is not excluded, changes more likely represent ileus.  No significant wall thickening.  Original Report Authenticated By: Neale Burly, M.D.   Dg Chest Port 1 View  01/29/2011  *RADIOLOGY REPORT*  Clinical Data: History of weakness and hypertension.  History of removal of endotracheal tube.  PORTABLE CHEST - 1 VIEW  Comparison: 01/28/2011.  Findings: Tip of left internal jugular venous catheter terminates in the superior vena cava.  No pneumothorax is evident.  Tip of enteric tube terminates in the proximal stomach area.  There is stable moderate enlargement of the cardiac silhouette.  There is elevation of the right hemidiaphragmatic margin with minimal right basilar atelectasis.  There is interval decrease in the amount of atelectasis in the right base since the prior examination.  There is improved aeration of the left lung.  There is interval decrease in the vascular congestion pattern.  No pleural effusion is evident.   IMPRESSION: Venous catheter and enteric tube are in place.  Stable moderate enlargement of the cardiac silhouette is seen.  Improved aeration in the right lung base with residual atelectasis.  Improved aeration in the left lung with decrease in vascular congestion pattern.  No pleural effusion or new lesion is evident.  Original Report Authenticated By: Delane Ginger, M.D.   Dg Chest Port 1 View  01/28/2011  *RADIOLOGY REPORT*  Clinical Data: Check endotracheal tube position.  PORTABLE CHEST - 1 VIEW  Comparison: 01/27/2011  Findings: Cardiomediastinal silhouette is stable.  There is worsening in aeration with mild congestion/edema.  Persistent small right pleural effusion with right basilar atelectasis or infiltrate.  Drainage catheter in the right  upper quadrant of the abdomen is stable.  NG tube in place.  No endotracheal tube is identified.  Stable left IJ central line position  IMPRESSION:  There is worsening in aeration with mild congestion/edema. Persistent small right pleural effusion with right basilar atelectasis or infiltrate.  Drainage catheter in the right upper quadrant of the abdomen is stable.  NG tube in place.  No endotracheal tube is identified.  Original Report Authenticated By: Lahoma Crocker, M.D.   Dg Chest Port 1 View  01/27/2011  *RADIOLOGY REPORT*  Clinical Data: Productive cough, evaluate for pneumonia  PORTABLE CHEST - 1 VIEW  Comparison: 01/26/2011; 01/24/2011; 01/22/2011; 11/27/2009  Findings: Unchanged enlarged cardiac silhouette and mediastinal contours.  Stable position of support apparatus.  There is persistent elevation of the right hemidiaphragm with blunting of the right costophrenic angles and right basilar heterogeneous opacities.  Minimal left basilar heterogeneous opacities.  No pneumothorax.  Cholecystostomy tube overlies the right upper abdominal quadrant.  Unchanged bones.  IMPRESSION: 1.  Persistent elevation of the right hemidiaphragm with small right-sided effusion and  right basilar opacities, possibly atelectasis though underlying infection is not excluded. 2. Minimal left basilar heterogeneous opacities, possibly atelectasis  Original Report Authenticated By: Rachel Moulds, M.D.   Dg Chest Port 1 View  01/26/2011  *RADIOLOGY REPORT*  Clinical Data: Line placement.  PORTABLE CHEST - 1 VIEW  Comparison: 01/24/2011  Findings: Nasogastric tube is in place with tip overlying the level of the stomach.  Left IJ central line tip overlies the level of the superior vena cava.  The film is very shallow lung inflation.  There is elevation of the right hemidiaphragm as before.  There is no evidence for pneumothorax.  Heart is enlarged.  There is perihilar bronchitic change.  Pulmonary vascular congestion is present.  There is bibasilar atelectasis.  IMPRESSION:  1.  Interval placement of nasogastric tube and left IJ central line. 2.  No evidence for postprocedure pneumothorax.  Original Report Authenticated By: Glenice Bow, M.D.   Dg Chest Port 1 View  01/24/2011  *RADIOLOGY REPORT*  Clinical Data: Shortness of breath and wheezing.  Chills.  PORTABLE CHEST - 1 VIEW  Comparison: 01/22/2011  Findings: Shallow inspiration with elevation of the right hemidiaphragm.  Infiltration or atelectasis again demonstrated in both lung bases, similar to previous study.  No blunting of costophrenic angles.  No pneumothorax.  Gas filled dilated bowel loops again demonstrated in the right upper quadrant.  Small bowel obstruction is not excluded.  IMPRESSION: Stable appearance of the chest since the previous study. Infiltration or atelectasis again demonstrated in the lung bases. Gaseous distension of small bowel loops in the right upper quadrant.  Original Report Authenticated By: Neale Burly, M.D.   Dg Abd Acute W/chest  01/25/2011  *RADIOLOGY REPORT*  Clinical Data: Abdominal distention.  Prior total colectomy.  ACUTE ABDOMEN SERIES (ABDOMEN 2 VIEW & CHEST 1 VIEW)  Comparison:  01/24/2011  Findings: The very low lung volumes are present bilaterally. Nasogastric tube tip projects over the stomach body.  There is likely atelectasis in both lower lobes.  The left side down lateral decubitus view of the abdomen demonstrates air fluid levels and scattered loops of dilated small bowel.  On supine imaging, the small bowel measures up to 6.2 cm in diameter, increased from previous.  No significant colonic stool or gas is observed.  IMPRESSION:  1.  Increasing distention of small bowel loops.  Although small bowel obstruction is favored based on the  radiographic appearance, ileus is not totally excluded.  Original Report Authenticated By: Carron Curie, M.D.   Dg Abd Acute W/chest  01/24/2011  *RADIOLOGY REPORT*  Clinical Data: Abdominal pain and fever.  Follow up ileus versus small bowel obstruction.  ACUTE ABDOMEN SERIES (ABDOMEN 2 VIEW & CHEST 1 VIEW)  Comparison: 01/22/2011  Findings: Heart size appears moderately enlarged.  There is no pleural effusion or pulmonary edema.  Lung volumes are low.  Asymmetric elevation of the right hemidiaphragm noted.  No airspace consolidation.  Abnormal small bowel dilatation is again noted.  The small bowel loops measure up to 5 cm.  This is increased from 4 cm previously.  Prior posterior hardware fixation is identified within the lower lumbar spine.  IMPRESSION:  1.  Progression of small bowel dilatation.  Original Report Authenticated By: Angelita Ingles, M.D.   Dg Abd Acute W/chest  01/22/2011  *RADIOLOGY REPORT*  Clinical Data: Epigastric pain and distention.  Nausea vomiting and diarrhea.  Weakness.  Fever.  Chills.  ACUTE ABDOMEN SERIES (ABDOMEN 2 VIEW & CHEST 1 VIEW)  Comparison: 01/20/2011 CT abdomen.  Most recent plain film chest of 06/27/2010.  Findings: Frontal view of the chest demonstrates midline trachea. Mild cardiomegaly, accentuated by low lung volumes.  Moderate right hemidiaphragm elevation is not significantly changed. No  pleural effusion or pneumothorax.  A left upper lobe calcified granuloma. Mild vascular crowding and infrahilar atelectasis on the right.  Abominal films demonstrate no free intraperitoneal air on upright positioning.  Air fluid levels within the mid small bowel loops. Mottled lucency about the right abdomen is favored to be gas within nearly completely fluid filled bowel loops.  Small bowel dilatation at 4.0 cm in the right upper abdomen. Paucity of distal gas.  Lumbosacral spine fixation.  IMPRESSION: Findings suspicious for small bowel obstruction. Consider CT.  Original Report Authenticated By: Areta Haber, M.D.    Medications: Scheduled Meds:   . cefTRIAXone (ROCEPHIN)  IV  1 g Intravenous Q24H  . enoxaparin  40 mg Subcutaneous Q24H  . fluticasone  2 spray Each Nare Daily  . metoprolol  5 mg Intravenous Q4H  . pantoprazole (PROTONIX) IV  40 mg Intravenous Q12H  . potassium chloride  10 mEq Intravenous Q1 Hr x 4  . potassium chloride  10 mEq Intravenous Q1 Hr x 2  . potassium chloride  10 mEq Intravenous Q1 Hr x 3  . DISCONTD: furosemide  40 mg Intravenous Q12H  . DISCONTD: piperacillin-tazobactam (ZOSYN)  IV  3.375 g Intravenous Q8H   Continuous Infusions:   . dextrose 40 mL/hr at 01/30/11 0600   PRN Meds:.acetaminophen, albuterol, fentaNYL, hydrALAZINE, promethazine, DISCONTD:  HYDROmorphone (DILAUDID) injection, DISCONTD: metoprolol  Assessment/Plan: 1. Severe sepsis/Klebsiella be a sepsis: Status post biliary drain. Continue ceftriaxone. 2. Small bowel obstruction versus ileus: Management per surgery. 3. Hypokalemia: Replete and follow BMP 4. A. fib with rapid ventricular rate: Scheduled metoprolol. Possibly transient from sepsis. Echo showed normal left ventricle function. 5. Mild respiratory failure: Per pulmonary M.D. 6. Acute on chronic kidney disease. Baseline creatinine probably in the range of 1.3-1.4. Monitor daily BMPs.    HONGALGI,ANAND 01/30/2011, 12:00 PM

## 2011-01-30 NOTE — Progress Notes (Signed)
  Subjective: Pt feels ok. Trying to drink tray of clear liquids despite bilious output coming from NG. Some BMs through flexiseal. Pain control ok.  Objective: Vital signs in last 24 hours: Temp:  [97.4 F (36.3 C)-98.9 F (37.2 C)] 98 F (36.7 C) (12/19 0650) Pulse Rate:  [58-66] 58  (12/19 0650) Resp:  [18-20] 20  (12/19 0650) BP: (132-157)/(60-89) 145/89 mmHg (12/19 0950) SpO2:  [92 %-96 %] 92 % (12/19 0650) Weight:  [80.3 kg (177 lb 0.5 oz)] 177 lb 0.5 oz (80.3 kg) (12/19 0702) Last BM Date: 01/29/11  Intake/Output this shift: Total I/O In: 240 [P.O.:240] Out: -   Physical Exam: BP 145/89  Pulse 58  Temp(Src) 98 F (36.7 C) (Oral)  Resp 20  Ht 5\' 8"  (1.727 m)  Wt 80.3 kg (177 lb 0.5 oz)  BMI 26.92 kg/m2  SpO2 92% Abdomen: soft, mildly tender low abdomen. RUQ soft, NT Drain intact, bilious output.  Labs: CBC  Basename 01/30/11 0535 01/29/11 0550  WBC 18.7* 17.0*  HGB 15.5 14.6  HCT 44.5 43.4  PLT 265 241   BMET  Basename 01/30/11 0535 01/29/11 0550  NA 144 146*  K 3.1* 3.2*  CL 107 108  CO2 25 23  GLUCOSE 133* 137*  BUN 53* 54*  CREATININE 1.60* 1.72*  CALCIUM 8.7 8.6   LFT  Basename 01/29/11 0550  PROT 5.9*  ALBUMIN 2.3*  AST 24  ALT 20  ALKPHOS 66  BILITOT 0.8  BILIDIR --  IBILI --  LIPASE --   PT/INR No results found for this basename: LABPROT:2,INR:2 in the last 72 hours ABG No results found for this basename: PHART:2,PCO2:2,PO2:2,HCO3:2 in the last 72 hours  Studies/Results: Dg Chest Port 1 View  01/29/2011  *RADIOLOGY REPORT*  Clinical Data: History of weakness and hypertension.  History of removal of endotracheal tube.  PORTABLE CHEST - 1 VIEW  Comparison: 01/28/2011.  Findings: Tip of left internal jugular venous catheter terminates in the superior vena cava.  No pneumothorax is evident.  Tip of enteric tube terminates in the proximal stomach area.  There is stable moderate enlargement of the cardiac silhouette.  There is  elevation of the right hemidiaphragmatic margin with minimal right basilar atelectasis.  There is interval decrease in the amount of atelectasis in the right base since the prior examination.  There is improved aeration of the left lung.  There is interval decrease in the vascular congestion pattern.  No pleural effusion is evident.  IMPRESSION: Venous catheter and enteric tube are in place.  Stable moderate enlargement of the cardiac silhouette is seen.  Improved aeration in the right lung base with residual atelectasis.  Improved aeration in the left lung with decrease in vascular congestion pattern.  No pleural effusion or new lesion is evident.  Original Report Authenticated By: Delane Ginger, M.D.    Assessment: Principal Problem:  *SBO (small bowel obstruction) Active Problems:  HYPERTENSION  CAD  Ileus  Dyslipidemia  Cholecystitis, acute s/p perc chole drain placement  Plan: Concern for residual ileus/PSBO given continued bilious output from NG. He is having bowel movement through flexiseal though. Will check abd x-ray series and trial clamp NG Back diet down to sips only.  LOS: 8 days    Federico Flake 01/30/2011

## 2011-01-31 ENCOUNTER — Other Ambulatory Visit: Payer: Self-pay

## 2011-01-31 ENCOUNTER — Inpatient Hospital Stay (HOSPITAL_COMMUNITY): Payer: Medicare Other

## 2011-01-31 DIAGNOSIS — I472 Ventricular tachycardia: Secondary | ICD-10-CM | POA: Diagnosis not present

## 2011-01-31 DIAGNOSIS — I4729 Other ventricular tachycardia: Secondary | ICD-10-CM | POA: Diagnosis not present

## 2011-01-31 DIAGNOSIS — I480 Paroxysmal atrial fibrillation: Secondary | ICD-10-CM | POA: Diagnosis not present

## 2011-01-31 LAB — URINE MICROSCOPIC-ADD ON

## 2011-01-31 LAB — DIFFERENTIAL
Basophils Absolute: 0 10*3/uL (ref 0.0–0.1)
Basophils Relative: 0 % (ref 0–1)
Lymphocytes Relative: 6 % — ABNORMAL LOW (ref 12–46)
Monocytes Relative: 7 % (ref 3–12)
Neutro Abs: 14.1 10*3/uL — ABNORMAL HIGH (ref 1.7–7.7)
Neutrophils Relative %: 85 % — ABNORMAL HIGH (ref 43–77)

## 2011-01-31 LAB — CBC
HCT: 44.8 % (ref 39.0–52.0)
Hemoglobin: 15.8 g/dL (ref 13.0–17.0)
RBC: 5.1 MIL/uL (ref 4.22–5.81)
WBC: 16.6 10*3/uL — ABNORMAL HIGH (ref 4.0–10.5)

## 2011-01-31 LAB — URINALYSIS, ROUTINE W REFLEX MICROSCOPIC
Glucose, UA: NEGATIVE mg/dL
Ketones, ur: NEGATIVE mg/dL
Leukocytes, UA: NEGATIVE
Specific Gravity, Urine: 1.027 (ref 1.005–1.030)
pH: 5.5 (ref 5.0–8.0)

## 2011-01-31 LAB — CULTURE, ROUTINE-ABSCESS

## 2011-01-31 LAB — TROPONIN I: Troponin I: <0.3 ng/mL (ref <0.30)

## 2011-01-31 LAB — GLUCOSE, CAPILLARY

## 2011-01-31 LAB — BASIC METABOLIC PANEL
Chloride: 106 mEq/L (ref 96–112)
GFR calc Af Amer: 63 mL/min — ABNORMAL LOW (ref >90)
GFR calc non Af Amer: 55 mL/min — ABNORMAL LOW (ref >90)
Potassium: 3.7 mEq/L (ref 3.5–5.1)
Sodium: 139 mEq/L (ref 135–145)

## 2011-01-31 LAB — CLOSTRIDIUM DIFFICILE BY PCR: Toxigenic C. Difficile by PCR: NEGATIVE

## 2011-01-31 MED ORDER — LORAZEPAM 0.5 MG PO TABS
0.5000 mg | ORAL_TABLET | Freq: Every evening | ORAL | Status: DC | PRN
  Administered 2011-01-31 – 2011-02-03 (×3): 0.5 mg via ORAL
  Filled 2011-01-31 (×3): qty 1

## 2011-01-31 MED ORDER — POTASSIUM CHLORIDE 10 MEQ/100ML IV SOLN
10.0000 meq | INTRAVENOUS | Status: AC
  Administered 2011-01-31 (×2): 10 meq via INTRAVENOUS
  Filled 2011-01-31 (×2): qty 100

## 2011-01-31 NOTE — Progress Notes (Addendum)
PCCM transfer  Gary [name] is an 77 y.o. male with PMH of CAD, hypertension, dyslipidemia diveritcular bleed, and colectomy was admitted on 01/22/2011 for abdominal pain. Initially he was assessed as having gastroenteritis versus ileus versus small bowel obstruction. Surgery is following. CT of abdomen with gallbladder thickening and on 12/15 he became confused, tachycardic, and was transfer to SDU. Perc chole drain placed by IR. Course complicated by AF / RVR requiring amio - now stopped due to bradycardia currently on scheduled IV metoprolol for this.      Subjective: Complains of NG tube discomfort and would like it out and wishes that he could drink some water.  Objective: Blood pressure 173/90, pulse 59, temperature 97.8 F (36.6 C), temperature source Oral, resp. rate 20, height 5\' 8"  (1.727 m), weight 80.287 kg (177 lb), SpO2 98.00%.  Intake/Output Summary (Last 24 hours) at 01/31/11 1008 Last data filed at 01/31/11 0956  Gross per 24 hour  Intake    480 ml  Output    850 ml  Net   -370 ml   General exam: Lying supine in bed and in no obvious distress. Has an NG tube and condom catheter. Respiratory system: Clear . No increased work of breathing. Cardiovascular system: Telemetry shows mostly sinus bradycardia in the high 50s to sinus rhythm in the 70s. First and second heart sounds heard, regular. No JVD or murmurs. Abdomen nondistended, nontender, soft and normal bowel sounds heard.  Central system: Alert and oriented. No focal deficits.  Lab Results: Basic Metabolic Panel:  Basename 01/31/11 0804 01/30/11 0535  NA 139 144  K 3.7 3.1*  CL 106 107  CO2 21 25  GLUCOSE 146* 133*  BUN 47* 53*  CREATININE 1.24 1.60*  CALCIUM 8.3* 8.7  MG -- 2.2  PHOS -- 3.4   Liver Function Tests:  Basename 01/29/11 0550  AST 24  ALT 20  ALKPHOS 66  BILITOT 0.8  PROT 5.9*  ALBUMIN 2.3*   No results found for this basename: LIPASE:2,AMYLASE:2 in the last 72 hours No results  found for this basename: AMMONIA:2 in the last 72 hours CBC:  Basename 01/31/11 0804 01/30/11 0535 01/29/11 0550  WBC 16.6* 18.7* --  NEUTROABS 14.1* -- 15.1*  HGB 15.8 15.5 --  HCT 44.8 44.5 --  MCV 87.8 87.4 --  PLT 261 265 --   CBG:  Basename 01/31/11 0556 01/30/11 0637 01/29/11 0627 01/28/11 1748 01/28/11 1231  GLUCAP 161* 140* 138* 110* 109*   Thyroid Function Tests:  Basename 01/28/11 1300  TSH 0.958  T4TOTAL --  FREET4 --  T3FREE --  THYROIDAB --   Urine Drug Screen: Drugs of Abuse     Component Value Date/Time   LABOPIA NONE DETECTED 08/09/2009 0521   COCAINSCRNUR NONE DETECTED 08/09/2009 0521   LABBENZ NONE DETECTED 08/09/2009 0521   AMPHETMU NONE DETECTED 08/09/2009 0521   THCU NONE DETECTED 08/09/2009 0521   LABBARB  Value: NONE DETECTED        DRUG SCREEN FOR MEDICAL PURPOSES ONLY.  IF CONFIRMATION IS NEEDED FOR ANY PURPOSE, NOTIFY LAB WITHIN 5 DAYS.        LOWEST DETECTABLE LIMITS FOR URINE DRUG SCREEN Drug Class       Cutoff (ng/mL) Amphetamine      1000 Barbiturate      200 Benzodiazepine   [id number] Tricyclics       [id number] Opiates          300 Cocaine  300 THC              50 08/09/2009 0521     Micro Results: Recent Results (from the past 240 hour(s))  CULTURE, BLOOD (ROUTINE X 2)     Status: Normal   Collection Time   01/22/11 10:07 PM      Component Value Range Status Comment   Specimen Description BLOOD RIGHT ARM   Final    Special Requests BOTTLES DRAWN AEROBIC AND ANAEROBIC 10CC   Final    Setup Time [id number]   Final    Culture NO GROWTH 5 DAYS   Final    Report Status 01/29/2011 FINAL   Final   CULTURE, BLOOD (ROUTINE X 2)     Status: Normal   Collection Time   01/22/11 10:15 PM      Component Value Range Status Comment   Specimen Description BLOOD RIGHT HAND   Final    Special Requests BOTTLES DRAWN AEROBIC AND ANAEROBIC 10CC   Final    Setup Time [id number]   Final    Culture NO GROWTH 5 DAYS   Final    Report Status 01/29/2011 FINAL    Final   URINE CULTURE     Status: Normal   Collection Time   01/23/11  7:00 AM      Component Value Range Status Comment   Specimen Description URINE, RANDOM   Final    Special Requests NONE   Final    Setup Time [id number]   Final    Colony Count NO GROWTH   Final    Culture NO GROWTH   Final    Report Status 01/24/2011 FINAL   Final   URINE CULTURE     Status: Normal   Collection Time   01/24/11  3:13 PM      Component Value Range Status Comment   Specimen Description URINE, CLEAN CATCH   Final    Special Requests NONE   Final    Setup Time [id number]   Final    Colony Count NO GROWTH   Final    Culture NO GROWTH   Final    Report Status 01/25/2011 FINAL   Final   MRSA PCR SCREENING     Status: Normal   Collection Time   01/26/11  5:35 AM      Component Value Range Status Comment   MRSA by PCR NEGATIVE  NEGATIVE  Final   CULTURE, BLOOD (ROUTINE X 2)     Status: Normal (Preliminary result)   Collection Time   01/26/11  9:00 AM      Component Value Range Status Comment   Specimen Description BLOOD   Final    Special Requests     Final    Value: BOTTLES DRAWN AEROBIC AND ANAEROBIC 5CC FROM TRIPLE LUMEN CATH LT IJ   Setup Time [id number]   Final    Culture     Final    Value:        BLOOD CULTURE RECEIVED NO GROWTH TO DATE CULTURE WILL BE HELD FOR 5 DAYS BEFORE ISSUING A FINAL NEGATIVE REPORT   Report Status PENDING   Incomplete   CULTURE, BLOOD (ROUTINE X 2)     Status: Normal (Preliminary result)   Collection Time   01/26/11 10:00 AM      Component Value Range Status Comment   Specimen Description BLOOD LEFT ARM   Final    Special Requests BOTTLES DRAWN AEROBIC AND ANAEROBIC 5CC   Final  Setup Time [id number]   Final    Culture     Final    Value:        BLOOD CULTURE RECEIVED NO GROWTH TO DATE CULTURE WILL BE HELD FOR 5 DAYS BEFORE ISSUING A FINAL NEGATIVE REPORT   Report Status PENDING   Incomplete   CULTURE, ROUTINE-ABSCESS     Status: Normal (Preliminary  result)   Collection Time   01/26/11  3:00 PM      Component Value Range Status Comment   Specimen Description ABSCESS GALL BLADDER   Final    Special Requests PATIENT ON FOLLOWING VANCOMYCIN ZOSYN   Final    Gram Stain     Final    Value: NO WBC SEEN     NO SQUAMOUS EPITHELIAL CELLS SEEN     FEW GRAM NEGATIVE RODS   Culture MULTIPLE ORGANISMS PRESENT, NONE PREDOMINANT   Final    Report Status PENDING   Incomplete   CULTURE, ROUTINE-ABSCESS     Status: Normal   Collection Time   01/26/11  8:37 PM      Component Value Range Status Comment   Specimen Description ABSCESS GALL BLADDER FLUID   Final    Special Requests NONE   Final    Gram Stain     Final    Value: ABUNDANT WBC PRESENT, PREDOMINANTLY PMN     NO SQUAMOUS EPITHELIAL CELLS SEEN     ABUNDANT GRAM VARIABLE ROD   Culture ABUNDANT KLEBSIELLA SPECIES   Final    Report Status 01/30/2011 FINAL   Final    Organism ID, Bacteria KLEBSIELLA SPECIES   Final   CLOSTRIDIUM DIFFICILE BY PCR     Status: Normal   Collection Time   01/31/11  5:36 AM      Component Value Range Status Comment   C difficile by pcr NEGATIVE  NEGATIVE  Final       Medications: Scheduled Meds:    . cefTRIAXone (ROCEPHIN)  IV  1 g Intravenous Q24H  . enoxaparin  40 mg Subcutaneous Q24H  . fluticasone  2 spray Each Nare Daily  . metoprolol  5 mg Intravenous Q4H  . pantoprazole (PROTONIX) IV  40 mg Intravenous Q12H  . potassium chloride  10 mEq Intravenous Q1 Hr x 3   Continuous Infusions:    . dextrose 40 mL/hr at 01/31/11 0800   PRN Meds:.acetaminophen, albuterol, fentaNYL, hydrALAZINE, promethazine  Assessment/Plan: 1. Severe sepsis/Klebsiella biliary sepsis: Status post biliary drain. Continue ceftriaxone. White cell count is better. Repeat urinalysis not suggestive of urinary tract infection and C. difficile PCR negative 2. Small bowel obstruction versus ileus: Management per surgery. They're planning to followup repeat abdominal x-ray and  see if the NG tube can come out today. Patient has a flexiseal 3. Hypokalemia: Repleted 4. A. fib with rapid ventricular rate: Scheduled metoprolol. Possibly transient from sepsis. Echo showed normal left ventricle function. Stable. Currently in sinus rhythm 5. Mild respiratory failure: Per pulmonary M.D. resolved 6. Acute on chronic kidney disease. Baseline creatinine probably in the range of 1.3-1.4. Improving.   Addendum: Patient had a run of 25 beat NSVT @ approx 245 pm which was asymtomatic and vitals were stable. Resolved spontaneously. Mg 2.2, K 3.7, recent echo OK. [place] consulted. Maintain K >4. EKG shows no acute findings. QTC 467 msecs.   Penelope Fittro 01/31/2011, 10:08 AM

## 2011-01-31 NOTE — Progress Notes (Signed)
  Subjective: Pt ok. Denies much pain. Pt changed rooms and was never re-hooked to LIWS, but tolerating ok without N/V Continues to have loose stool via Flexiseal C-Diff negative  Objective: Vital signs in last 24 hours: Temp:  [97.8 F (36.6 C)-98.6 F (37 C)] 97.8 F (36.6 C) (12/20 0533) Pulse Rate:  [59-76] 59  (12/20 0533) Resp:  [20] 20  (12/20 0533) BP: (145-173)/(76-90) 173/90 mmHg (12/20 0533) SpO2:  [98 %] 98 % (12/20 0533) Weight:  [80.287 kg (177 lb)] 177 lb (80.287 kg) (12/20 0533) Last BM Date: 01/30/11  Intake/Output this shift:    Physical Exam: BP 173/90  Pulse 59  Temp(Src) 97.8 F (36.6 C) (Oral)  Resp 20  Ht 5\' 8"  (1.727 m)  Wt 80.287 kg (177 lb)  BMI 26.91 kg/m2  SpO2 98% Abdomen: soft, NT, ND Few BS  Labs: CBC  Basename 01/31/11 0804 01/30/11 0535  WBC 16.6* 18.7*  HGB 15.8 15.5  HCT 44.8 44.5  PLT 261 265   BMET  Basename 01/30/11 0535 01/29/11 0550  NA 144 146*  K 3.1* 3.2*  CL 107 108  CO2 25 23  GLUCOSE 133* 137*  BUN 53* 54*  CREATININE 1.60* 1.72*  CALCIUM 8.7 8.6   LFT  Basename 01/29/11 0550  PROT 5.9*  ALBUMIN 2.3*  AST 24  ALT 20  ALKPHOS 66  BILITOT 0.8  BILIDIR --  IBILI --  LIPASE --   PT/INR No results found for this basename: LABPROT:2,INR:2 in the last 72 hours ABG No results found for this basename: PHART:2,PCO2:2,PO2:2,HCO3:2 in the last 72 hours  Studies/Results: Dg Abd Acute W/chest  01/30/2011  *RADIOLOGY REPORT*  Clinical Data: Leukocytosis and ileus. Previous colon resection.  ACUTE ABDOMEN SERIES (ABDOMEN 2 VIEW & CHEST 1 VIEW)  Comparison: CT 01/25/2011 and abdominal series 01/25/2011  Findings: Chest radiograph demonstrates a left jugular central venous catheter tip in the upper SVC region.  Elevation of the right hemidiaphragm.  Nasogastric tube in the stomach fundus region.  There is a cholecystostomy tube in the right upper quadrant of the abdomen.  Decubitus image demonstrates small bowel  gas throughout the abdomen.  No evidence for free air. The degree of small bowel gaseous distention has decreased since 01/25/2011. Postsurgical changes in the lower lumbar spine.  Question a small nodule in the lateral left lung that roughly measures 5 mm.  This most likely represents overlying shadows.  IMPRESSION: Dilated loops of small bowel throughout the abdomen. The degree of distention has decreased since 01/25/2011.  Low lung volumes.  Question a nodular density in the left lung probably representing overlying shadows.  Recommend attention this area on followup imaging.  Original Report Authenticated By: Markus Daft, M.D.    Assessment: Principal Problem:  *SBO (small bowel obstruction) Active Problems:  HYPERTENSION  CAD  Ileus  Dyslipidemia  Cholecystitis, acute   Procedure(s): LAPAROSCOPIC CHOLECYSTECTOMY WITH INTRAOPERATIVE CHOLANGIOGRAM  Plan: WBC down. X-ray yesterday suggested ileus pattern, still some bilious output from NG Rehooked to suction and 100cc bilious output immediately removed, then nothing. Will check KUB today and if not much more comes from NG and x-ray looks better, then can remove NG.  LOS: 9 days    Federico Flake 01/31/2011

## 2011-01-31 NOTE — Consults (Signed)
Reason for Consult: NSVT  Requesting Physician: Dr Algis Liming  HPI: This is a 77 y.o. male with a past medical history significant for coronary disease. The patient had a circumflex MI treated medically June 2011. He was put on aspirin and Plavix but had recurrent diverticular bleeding. Ultimately it was decided to wait 6 months and then perform a elective colectomy. This was done in December of 2011. Patient tolerated this procedure well. He has done well from a cardiac standpoint, he saw Dr. little one month ago. He was admitted December 9th 2012 with abdominal pain. Ultimately he required a biliary drain for biliary sepsis. He has been n.p.o. In the postop period he developed paroxysmal atrial fibrillation which was treated with amiodarone, he has converted to sinus rhythm. He is still n.p.o. but getting IV beta blockers Q4 hours. He was transferred to telemetry. Today on telemetry he had a run of 25 beats of nonsustained ventricular tachycardia. He was asymptomatic with this. We were asked to see him in consult. He denies any anginal symptoms or unusual dyspnea. Echocardiogram done this admission showed normal LV function with grade 1 diastolic dysfunction. His K+, Mg++, QTc are all within normal limits.  PMHx:  Past Medical History  Diagnosis Date  . Previous back surgery 1997  . Hypertension   . Hemorrhoids   . Chronic kidney disease   . Kidney stones   . Dyslipidemia   . Diverticulosis   . Colon polyps   . Angina   . Heart attack 07/2009  . Blood transfusion   . Anemia   . GI bleeding after 07/2009    "triggered by Plavix & ASA; from my diverticulosis"  . GERD (gastroesophageal reflux disease)   . Headache     occasionally  . Anxiety   . Arthritis     "hips and lower back"  . Chronic back pain greater than 3 months duration    Past Surgical History  Procedure Date  . Eye surgery 1942  . Back surgery 1997  . Colon surgery     COLONOSCOPY  . Subtotal colectomy 01/24/2010     FAMHx: Family History  Problem Relation Age of Onset  . Heart disease Father   . Colon cancer Neg Hx   . Pancreatic cancer Mother     SOCHx:  reports that he has never smoked. He has never used smokeless tobacco. He reports that he drinks alcohol. He reports that he does not use illicit drugs.  ALLERGIES: Allergies  Allergen Reactions  . Clopidogrel Bisulfate Other (See Comments)    H/O diverticulitis; prone to rectal bleeding.  Plavix complicated this.  jkl  . Sulfonamide Derivatives Hives  . Naproxen Rash    ROS: Pertinent items are noted in HPI.  HOME MEDICATIONS: Prescriptions prior to admission  Medication Sig Dispense Refill  . acetaminophen (TYLENOL) 500 MG tablet Take 500 mg by mouth every 6 (six) hours as needed.        Marland Kitchen aspirin 81 MG tablet Take 81 mg by mouth daily.        . citalopram (CELEXA) 20 MG tablet Take 20 mg by mouth daily.        Marland Kitchen DOXAZOSIN MESYLATE PO Take 8 mg by mouth daily.        . fexofenadine (ALLEGRA) 180 MG tablet Take 180 mg by mouth daily.        . fluticasone (FLONASE) 50 MCG/ACT nasal spray 2 sprays by Nasal route daily.        Marland Kitchen  gabapentin (NEURONTIN) 400 MG capsule Take 400 mg by mouth 3 (three) times daily.        Marland Kitchen HYDROcodone-acetaminophen (NORCO) 5-325 MG per tablet Take 1 tablet by mouth every 6 (six) hours as needed. For pain      . lisinopril (PRINIVIL,ZESTRIL) 10 MG tablet Take 10 mg by mouth daily.        . nebivolol (BYSTOLIC) 5 MG tablet Take 5 mg by mouth daily.        Marland Kitchen omeprazole (PRILOSEC) 20 MG capsule Take 1 capsule (20 mg total) by mouth daily.  30 capsule  1  . ondansetron (ZOFRAN ODT) 4 MG disintegrating tablet Take 1 tablet (4 mg total) by mouth every 8 (eight) hours as needed for nausea.  20 tablet  0  . simvastatin (ZOCOR) 40 MG tablet Take 40 mg by mouth at bedtime.        Marland Kitchen testosterone (ANDROGEL) 50 MG/5GM GEL Place 5 g onto the skin daily.        . traMADol-acetaminophen (ULTRACET) 37.5-325 MG per tablet  Take 1 tablet by mouth every 6 (six) hours as needed.        . nitroGLYCERIN (NITROSTAT) 0.4 MG SL tablet Place 0.4 mg under the tongue every 5 (five) minutes as needed.          HOSPITAL MEDICATIONS: I have reviewed the patient's current medications.  VITALS: Blood pressure 155/88, pulse 54, temperature 97.8 F (36.6 C), temperature source Oral, resp. rate 20, height 5\' 8"  (1.727 m), weight 80.287 kg (177 lb), SpO2 97.00%.  PHYSICAL EXAM: General appearance: alert, cooperative and no distress Neck: no adenopathy, no carotid bruit, no JVD, supple, symmetrical, trachea midline and thyroid not enlarged, symmetric, no tenderness/mass/nodules Lungs: clear to auscultation bilaterally Heart: regular rate and rhythm Abdomen: not distended, BS diminnished Extremities: no edema Neurologic: Grossly normal  LABS: Results for orders placed during the hospital encounter of 01/22/11 (from the past 48 hour(s))  CBC     Status: Abnormal   Collection Time   01/30/11  5:35 AM      Component Value Range Comment   WBC 18.7 (*) 4.0 - 10.5 (K/uL)    RBC 5.09  4.22 - 5.81 (MIL/uL)    Hemoglobin 15.5  13.0 - 17.0 (g/dL)    HCT 44.5  39.0 - 52.0 (%)    MCV 87.4  78.0 - 100.0 (fL)    MCH 30.5  26.0 - 34.0 (pg)    MCHC 34.8  30.0 - 36.0 (g/dL)    RDW 14.4  11.5 - 15.5 (%)    Platelets 265  150 - 400 (K/uL)   BASIC METABOLIC PANEL     Status: Abnormal   Collection Time   01/30/11  5:35 AM      Component Value Range Comment   Sodium 144  135 - 145 (mEq/L)    Potassium 3.1 (*) 3.5 - 5.1 (mEq/L)    Chloride 107  96 - 112 (mEq/L)    CO2 25  19 - 32 (mEq/L)    Glucose, Bld 133 (*) 70 - 99 (mg/dL)    BUN 53 (*) 6 - 23 (mg/dL)    Creatinine, Ser 1.60 (*) 0.50 - 1.35 (mg/dL)    Calcium 8.7  8.4 - 10.5 (mg/dL)    GFR calc non Af Amer 40 (*) >90 (mL/min)    GFR calc Af Amer 47 (*) >90 (mL/min)   MAGNESIUM     Status: Normal   Collection Time  01/30/11  5:35 AM      Component Value Range Comment    Magnesium 2.2  1.5 - 2.5 (mg/dL)   PHOSPHORUS     Status: Normal   Collection Time   01/30/11  5:35 AM      Component Value Range Comment   Phosphorus 3.4  2.3 - 4.6 (mg/dL)   GLUCOSE, CAPILLARY     Status: Abnormal   Collection Time   01/30/11  6:37 AM      Component Value Range Comment   Glucose-Capillary 140 (*) 70 - 99 (mg/dL)    Comment 1 Notify RN     CLOSTRIDIUM DIFFICILE BY PCR     Status: Normal   Collection Time   01/31/11  5:36 AM      Component Value Range Comment   C difficile by pcr NEGATIVE  NEGATIVE    URINALYSIS, ROUTINE W REFLEX MICROSCOPIC     Status: Abnormal   Collection Time   01/31/11  5:39 AM      Component Value Range Comment   Color, Urine YELLOW  YELLOW     APPearance CLEAR  CLEAR     Specific Gravity, Urine 1.027  1.005 - 1.030     pH 5.5  5.0 - 8.0     Glucose, UA NEGATIVE  NEGATIVE (mg/dL)    Hgb urine dipstick MODERATE (*) NEGATIVE     Bilirubin Urine NEGATIVE  NEGATIVE     Ketones, ur NEGATIVE  NEGATIVE (mg/dL)    Protein, ur NEGATIVE  NEGATIVE (mg/dL)    Urobilinogen, UA 0.2  0.0 - 1.0 (mg/dL)    Nitrite NEGATIVE  NEGATIVE     Leukocytes, UA NEGATIVE  NEGATIVE    URINE MICROSCOPIC-ADD ON     Status: Abnormal   Collection Time   01/31/11  5:39 AM      Component Value Range Comment   WBC, UA 0-2  <3 (WBC/hpf)    RBC / HPF 3-6  <3 (RBC/hpf)    Bacteria, UA RARE  RARE     Crystals URIC ACID CRYSTALS (*) NEGATIVE     Urine-Other MUCOUS PRESENT     GLUCOSE, CAPILLARY     Status: Abnormal   Collection Time   01/31/11  5:56 AM      Component Value Range Comment   Glucose-Capillary 161 (*) 70 - 99 (mg/dL)    Comment 1 Documented in Chart      Comment 2 Notify RN     BASIC METABOLIC PANEL     Status: Abnormal   Collection Time   01/31/11  8:04 AM      Component Value Range Comment   Sodium 139  135 - 145 (mEq/L)    Potassium 3.7  3.5 - 5.1 (mEq/L)    Chloride 106  96 - 112 (mEq/L)    CO2 21  19 - 32 (mEq/L)    Glucose, Bld 146 (*) 70 -  99 (mg/dL)    BUN 47 (*) 6 - 23 (mg/dL)    Creatinine, Ser 1.24  0.50 - 1.35 (mg/dL)    Calcium 8.3 (*) 8.4 - 10.5 (mg/dL)    GFR calc non Af Amer 55 (*) >90 (mL/min)    GFR calc Af Amer 63 (*) >90 (mL/min)   CBC     Status: Abnormal   Collection Time   01/31/11  8:04 AM      Component Value Range Comment   WBC 16.6 (*) 4.0 - 10.5 (K/uL)    RBC  5.10  4.22 - 5.81 (MIL/uL)    Hemoglobin 15.8  13.0 - 17.0 (g/dL)    HCT 44.8  39.0 - 52.0 (%)    MCV 87.8  78.0 - 100.0 (fL)    MCH 31.0  26.0 - 34.0 (pg)    MCHC 35.3  30.0 - 36.0 (g/dL)    RDW 14.0  11.5 - 15.5 (%)    Platelets 261  150 - 400 (K/uL)   DIFFERENTIAL     Status: Abnormal   Collection Time   01/31/11  8:04 AM      Component Value Range Comment   Neutrophils Relative 85 (*) 43 - 77 (%)    Lymphocytes Relative 6 (*) 12 - 46 (%)    Monocytes Relative 7  3 - 12 (%)    Eosinophils Relative 2  0 - 5 (%)    Basophils Relative 0  0 - 1 (%)    Neutro Abs 14.1 (*) 1.7 - 7.7 (K/uL)    Lymphs Abs 1.0  0.7 - 4.0 (K/uL)    Monocytes Absolute 1.2 (*) 0.1 - 1.0 (K/uL)    Eosinophils Absolute 0.3  0.0 - 0.7 (K/uL)    Basophils Absolute 0.0  0.0 - 0.1 (K/uL)    RBC Morphology POLYCHROMASIA PRESENT      WBC Morphology        Value: MODERATE LEFT SHIFT (>5% METAS AND MYELOS,OCC PRO NOTED)   Smear Review LARGE PLATELETS PRESENT      Echo 01/28/11: LV cavity size was normal. Systolic function - vigorous.EF 65% to 70%. Wall motion was normal; grade 1 diastolic dysfunction). - Aortic valve: Trivial regurgitation. - Mitral valve: Calcified annulus. Mild regurgitation. - Left atrium: mildly to moderately dilated. - Right atrium:  mildly dilated. Estimated Pulmonary Systolic pressure - moderatelyincreased.  IMPRESSION:  Principal Problem:  *Cholecystitis, acute, s/p biliary drain this adm  Active Problems:  NSVT (nonsustained ventricular tachycardia), asymptomatic  CAD, CFX MI Rx'd medically 6/11 - LCx occluded with R-L collaterals, Med  Rx.  SBO (small bowel obstruction), post op, still NPO  PAF (paroxysmal atrial fibrillation), in setting of sepsis this adm Rx'd with Amio, NSR now  HYPERTENSION  DIVERTICULOSIS, COLON, WITH HEMORRHAGE, surg 12/11   Dyslipidemia   RECOMMENDATION: Continue IV Lopressor 5mg  Q 4hrs, keep K+ close to 4.0. MD to see. Check 12 lead and cycle cardiac enzymes.  Time Spent Directly with Patient: 35 minutes  KILROY,LUKE K 01/31/2011, 3:57 PM    I have seen and examined the patient along with Kerin Ransom, PA.  I have reviewed the chart, notes and new data.  I agree with Luke's note.  Key new complaints: No further complaints from cardiac standpoint. Did not feel any dizziness lightheadedness wooziness. He has been feeling generally weak with sitting up but has not felt palpitations or      syncope/presyncope.     Prior to his hospitalization denies any dizziness, lightheadedness, wooziness, syncope/presyncope. No palpitations. Key examination changes: Heart is regular rate with a normal S1-S2 did not hear murmurs or gallops. Key new findings / data: Creatinine actually seems to be improving 1.24. Potassium is 3.7, magnesium yesterday was 2.2.  PLAN: I agree with continued IV Lopressor. The patient is asymptomatic with a normal EF. No evidence of a large scar. Next  He does have ischemic coronary disease with occluded circumflex fed by right to left collaterals. I agree with cycle cardiac enzymes are resulted abnormal. Continue to monitor on telemetry. We'll continue to continue  supportive therapy throughout this current biliary sepsis episode. May considered outpatient stress testing to evaluate for further ischemia once his current medical issues are completely resolved.  Agree with keeping get a K+ greater than 4.0 and the Mg greater than 2.0.  We'll continue to follow please notify us of any additional abnormalities.  Leonie Man, M.D., M.S. [facility] [address] [place], [place]  [address]  [phone]  01/31/2011 4:33 PM

## 2011-01-31 NOTE — Progress Notes (Signed)
Physical Therapy Treatment Patient Details Name: Gary [name] MRN: [id number] DOB: Aug 05, 1932 Today's Date: 01/31/2011  PT Assessment/Plan  PT - Assessment/Plan Comments on Treatment Session: Pt able to ambulate at total of 15 feet this treatment session.  However continues to be limited due to fatigue.  Pt stated " I never been this fatigue in my life."  Pt willing work but needs extra time to complete task.  Continue to recommend inpatient rehab for pt to improve overall strength, endurance and overall mobility. PT Plan: Discharge plan remains appropriate PT Frequency: Min 3X/week Follow Up Recommendations: Inpatient Rehab Equipment Recommended: Rolling walker with 5" wheels;3 in 1 bedside comode PT Goals  Acute Rehab PT Goals PT Goal Formulation: With patient Time For Goal Achievement: 2 weeks Pt will go Supine/Side to Sit: with supervision PT Goal: Supine/Side to Sit - Progress: Progressing toward goal Pt will go Sit to Supine/Side: with supervision PT Goal: Sit to Supine/Side - Progress: Progressing toward goal Pt will go Sit to Stand: with supervision PT Goal: Sit to Stand - Progress: Progressing toward goal Pt will go Stand to Sit: with supervision PT Goal: Stand to Sit - Progress: Progressing toward goal Pt will Transfer Bed to Chair/Chair to Bed: with supervision PT Transfer Goal: Bed to Chair/Chair to Bed - Progress: Progressing toward goal Pt will Ambulate: >150 feet;with rolling walker;with supervision PT Goal: Ambulate - Progress: Progressing toward goal  PT Treatment Precautions/Restrictions  Precautions Precautions: Fall Restrictions Weight Bearing Restrictions: No Mobility (including Balance) Bed Mobility Bed Mobility: Yes Rolling Right: 4: Min assist;With rail Rolling Right Details (indicate cue type and reason): (A) to initiate transfer with cues for hand placement Rolling Left: 4: Min assist Rolling Left Details (indicate cue type and reason): (A) to  complete roll and cues for hand placement.  Pt needed total (A) to clean after flexiseal leak. Supine to Sit: 4: Min assist;HOB elevated (Comment degrees) Supine to Sit Details (indicate cue type and reason): (A) to elevate trunk OOB and max VCs for technique Transfers Transfers: Yes Sit to Stand: 3: Mod assist;With upper extremity assist;From bed Sit to Stand Details (indicate cue type and reason): (A) to initiate transfer with cues for hand and LE placement. Stand to Sit: 4: Min assist;To chair/3-in-1;With armrests Stand to Sit Details: (A) to slowly descend to recliner Ambulation/Gait Ambulation/Gait: Yes Ambulation/Gait Assistance: 4: Min assist Ambulation/Gait Assistance Details (indicate cue type and reason): (A) with RW management and maintain balance Ambulation Distance (Feet): 15 Feet (5 feet then seated rest break then able to ambulate 10 feet ) Assistive device: Rolling walker Gait Pattern: Decreased step length - right;Decreased step length - left;Shuffle  Balance Balance Assessed: Yes Static Sitting Balance Static Sitting - Balance Support: No upper extremity supported Static Sitting - Level of Assistance: 5: Stand by assistance Exercise    End of Session PT - End of Session Equipment Utilized During Treatment: Gait belt Activity Tolerance: Patient limited by fatigue Patient left: in chair;with call bell in reach Nurse Communication: Mobility status for transfers General Behavior During Session: [facility] for tasks performed Cognition: Impaired Cognitive Impairment: Pt slow to process at times and difficulty with problem solving  Elenor Wildes 01/31/2011, 2:34 PM [phone]

## 2011-01-31 NOTE — Progress Notes (Signed)
I met with patient and his wife at bedside. I await progress with removal of NGT and advancement of diet. Will follow daily to assist in determining if an inpatient acute rehabilitaton stay is needed prior to discharge home. Please call me with any questions. Pager [phone]

## 2011-01-31 NOTE — Progress Notes (Signed)
Subjective: Chole drain placed 12/15 Feels some better  Objective: Vital signs in last 24 hours: Temp:  [97.8 F (36.6 C)-98.6 F (37 C)] 97.8 F (36.6 C) (12/20 0533) Pulse Rate:  [59-76] 59  (12/20 0533) Resp:  [20] 20  (12/20 0533) BP: (157-173)/(76-90) 173/90 mmHg (12/20 0533) SpO2:  [98 %] 98 % (12/20 0533) Weight:  [177 lb (80.287 kg)] 177 lb (80.287 kg) (12/20 0533) Last BM Date: 01/30/11  Intake/Output from previous day: 12/19 0701 - 12/20 0700 In: 720 [P.O.:720] Out: 925 [Urine:525; Emesis/NG output:100; Drains:100; Stool:200] Intake/Output this shift: Total I/O In: -  Out: 125 [Urine:125]  PE:  Site of drain clean and dry; NT Drain intact Output good; bile with pus Wbc 16.6  Lab Results:   Basename 01/31/11 0804 01/30/11 0535  WBC 16.6* 18.7*  HGB 15.8 15.5  HCT 44.8 44.5  PLT 261 265   BMET  Basename 01/31/11 0804 01/30/11 0535  NA 139 144  K 3.7 3.1*  CL 106 107  CO2 21 25  GLUCOSE 146* 133*  BUN 47* 53*  CREATININE 1.24 1.60*  CALCIUM 8.3* 8.7   PT/INR No results found for this basename: LABPROT:2,INR:2 in the last 72 hours ABG No results found for this basename: PHART:2,PCO2:2,PO2:2,HCO3:2 in the last 72 hours  Studies/Results: Dg Abd Acute W/chest  01/30/2011  *RADIOLOGY REPORT*  Clinical Data: Leukocytosis and ileus. Previous colon resection.  ACUTE ABDOMEN SERIES (ABDOMEN 2 VIEW & CHEST 1 VIEW)  Comparison: CT 01/25/2011 and abdominal series 01/25/2011  Findings: Chest radiograph demonstrates a left jugular central venous catheter tip in the upper SVC region.  Elevation of the right hemidiaphragm.  Nasogastric tube in the stomach fundus region.  There is a cholecystostomy tube in the right upper quadrant of the abdomen.  Decubitus image demonstrates small bowel gas throughout the abdomen.  No evidence for free air. The degree of small bowel gaseous distention has decreased since 01/25/2011. Postsurgical changes in the lower lumbar spine.   Question a small nodule in the lateral left lung that roughly measures 5 mm.  This most likely represents overlying shadows.  IMPRESSION: Dilated loops of small bowel throughout the abdomen. The degree of distention has decreased since 01/25/2011.  Low lung volumes.  Question a nodular density in the left lung probably representing overlying shadows.  Recommend attention this area on followup imaging.  Original Report Authenticated By: Markus Daft, M.D.    Anti-infectives: Anti-infectives     Start     Dose/Rate Route Frequency Ordered Stop   01/30/11 0900   cefTRIAXone (ROCEPHIN) 1 g in dextrose 5 % 50 mL IVPB        1 g 100 mL/hr over 30 Minutes Intravenous Every 24 hours 01/30/11 0828     01/27/11 1200   fluconazole (DIFLUCAN) IVPB 400 mg  Status:  Discontinued        400 mg 200 mL/hr over 60 Minutes Intravenous Every 24 hours 01/26/11 0940 01/28/11 1234   01/26/11 2200   vancomycin (VANCOCIN) 750 mg in sodium chloride 0.9 % 150 mL IVPB  Status:  Discontinued        750 mg 150 mL/hr over 60 Minutes Intravenous Every 12 hours 01/26/11 0934 01/28/11 1234   01/26/11 1200   piperacillin-tazobactam (ZOSYN) IVPB 4.5 g  Status:  Discontinued        4.5 g 200 mL/hr over 30 Minutes Intravenous 4 times per day 01/26/11 0850 01/26/11 0929   01/26/11 1100   fluconazole (DIFLUCAN) IVPB 800  mg        800 mg 400 mL/hr over 60 Minutes Intravenous  Once 01/26/11 0938 01/26/11 1200   01/26/11 1000   vancomycin (VANCOCIN) 1,500 mg in sodium chloride 0.9 % 500 mL IVPB        1,500 mg 250 mL/hr over 120 Minutes Intravenous  Once 01/26/11 0933 01/26/11 1521   01/26/11 0930   piperacillin-tazobactam (ZOSYN) IVPB 3.375 g  Status:  Discontinued        3.375 g 12.5 mL/hr over 240 Minutes Intravenous 3 times per day 01/26/11 0929 01/30/11 0828   01/26/11 0900   fluconazole (DIFLUCAN) IVPB 800 mg  Status:  Discontinued        800 mg 400 mL/hr over 60 Minutes Intravenous Every 24 hours 01/26/11 0850  01/26/11 0935   01/26/11 0900   vancomycin (VANCOCIN) IVPB 1000 mg/200 mL premix  Status:  Discontinued     Comments: STAT      1,000 mg 200 mL/hr over 60 Minutes Intravenous Every 12 hours 01/26/11 0850 01/26/11 0932   01/25/11 1030   ertapenem (INVANZ) 1 g in sodium chloride 0.9 % 50 mL IVPB  Status:  Discontinued        1 g 100 mL/hr over 30 Minutes Intravenous Every 24 hours 01/25/11 0952 01/26/11 0850   01/24/11 1000   azithromycin (ZITHROMAX) 500 mg in dextrose 5 % 250 mL IVPB  Status:  Discontinued        500 mg 250 mL/hr over 60 Minutes Intravenous Every 24 hours 01/24/11 0840 01/24/11 0945   01/24/11 0930   cefTRIAXone (ROCEPHIN) 1 g in dextrose 5 % 50 mL IVPB  Status:  Discontinued        1 g 100 mL/hr over 30 Minutes Intravenous Every 24 hours 01/24/11 0840 01/24/11 0945          Assessment/Plan: s/p Procedure(s): LAPAROSCOPIC CHOLECYSTECTOMY WITH INTRAOPERATIVE CHOLANGIOGRAM  Cholecystostomy tube drain placed 12/15 Pt better Wbc still high; afeb Output is bile with pus Drain needs to stay in for 6-8 weeks unless goes to surgery Call us if need [place]   Breyson Kelm A 01/31/2011

## 2011-02-01 DIAGNOSIS — N400 Enlarged prostate without lower urinary tract symptoms: Secondary | ICD-10-CM | POA: Diagnosis present

## 2011-02-01 DIAGNOSIS — E876 Hypokalemia: Secondary | ICD-10-CM | POA: Diagnosis not present

## 2011-02-01 LAB — BASIC METABOLIC PANEL
CO2: 22 mEq/L (ref 19–32)
Chloride: 105 mEq/L (ref 96–112)
Creatinine, Ser: 1.15 mg/dL (ref 0.50–1.35)
GFR calc Af Amer: 69 mL/min — ABNORMAL LOW (ref >90)
Potassium: 3.3 mEq/L — ABNORMAL LOW (ref 3.5–5.1)

## 2011-02-01 LAB — CBC
MCV: 87.4 fL (ref 78.0–100.0)
Platelets: 261 10*3/uL (ref 150–400)
RBC: 5.22 MIL/uL (ref 4.22–5.81)
RDW: 13.9 % (ref 11.5–15.5)
WBC: 18.3 10*3/uL — ABNORMAL HIGH (ref 4.0–10.5)

## 2011-02-01 LAB — DIFFERENTIAL
Basophils Relative: 0 % (ref 0–1)
Eosinophils Relative: 2 % (ref 0–5)
Lymphocytes Relative: 6 % — ABNORMAL LOW (ref 12–46)
Monocytes Relative: 5 % (ref 3–12)
Neutrophils Relative %: 87 % — ABNORMAL HIGH (ref 43–77)

## 2011-02-01 LAB — CULTURE, BLOOD (ROUTINE X 2)
Culture  Setup Time: 201212151345
Culture  Setup Time: 201212151345
Culture: NO GROWTH
Culture: NO GROWTH

## 2011-02-01 LAB — TROPONIN I: Troponin I: <0.3 ng/mL (ref <0.30)

## 2011-02-01 MED ORDER — POTASSIUM CHLORIDE 10 MEQ/100ML IV SOLN
10.0000 meq | INTRAVENOUS | Status: DC
Start: 2011-02-01 — End: 2011-02-01
  Administered 2011-02-01 (×2): 10 meq via INTRAVENOUS
  Filled 2011-02-01 (×3): qty 100

## 2011-02-01 MED ORDER — POTASSIUM CHLORIDE 10 MEQ/100ML IV SOLN: 10.0000 meq | INTRAVENOUS | Status: DC

## 2011-02-01 MED ORDER — POTASSIUM CHLORIDE 10 MEQ/100ML IV SOLN
10.0000 meq | INTRAVENOUS | Status: AC
  Administered 2011-02-01 – 2011-02-02 (×2): 10 meq via INTRAVENOUS
  Filled 2011-02-01: qty 100

## 2011-02-01 MED ORDER — POTASSIUM CHLORIDE 10 MEQ/100ML IV SOLN
10.0000 meq | INTRAVENOUS | Status: AC
  Administered 2011-02-01: 10 meq via INTRAVENOUS
  Filled 2011-02-01: qty 100

## 2011-02-01 MED ORDER — TAMSULOSIN HCL 0.4 MG PO CAPS
0.4000 mg | ORAL_CAPSULE | Freq: Every day | ORAL | Status: DC
  Administered 2011-02-01 – 2011-02-03 (×4): 0.4 mg via ORAL
  Filled 2011-02-01 (×4): qty 1

## 2011-02-01 MED ORDER — METRONIDAZOLE IN NACL 5-0.79 MG/ML-% IV SOLN
500.0000 mg | Freq: Three times a day (TID) | INTRAVENOUS | Status: DC
  Administered 2011-02-01 – 2011-02-03 (×6): 500 mg via INTRAVENOUS
  Filled 2011-02-01 (×9): qty 100

## 2011-02-01 MED ORDER — OXYCODONE HCL 5 MG PO TABS
5.0000 mg | ORAL_TABLET | ORAL | Status: DC | PRN
Start: 2011-02-01 — End: 2011-02-04
  Administered 2011-02-02 – 2011-02-04 (×4): 5 mg via ORAL
  Filled 2011-02-01 (×5): qty 1

## 2011-02-01 MED ORDER — DOXAZOSIN MESYLATE 1 MG PO TABS
1.0000 mg | ORAL_TABLET | Freq: Every day | ORAL | Status: DC
  Administered 2011-02-01 – 2011-02-04 (×4): 1 mg via ORAL
  Filled 2011-02-01 (×4): qty 1

## 2011-02-01 MED ORDER — POTASSIUM CHLORIDE 10 MEQ/100ML IV SOLN
10.0000 meq | INTRAVENOUS | Status: AC
  Administered 2011-02-01 (×3): 10 meq via INTRAVENOUS
  Filled 2011-02-01 (×3): qty 100

## 2011-02-01 NOTE — Progress Notes (Signed)
Subjective:  No chest pain or SOB. Some difficulty voiding.  Objective:  Vital Signs in the last 24 hours: Temp:  [97.5 F (36.4 C)-97.9 F (36.6 C)] 97.5 F (36.4 C) (12/21 0354) Pulse Rate:  [53-62] 56  (12/21 0354) Resp:  [20] 20  (12/21 0354) BP: (138-155)/(74-88) 138/74 mmHg (12/21 0354) SpO2:  [96 %-97 %] 96 % (12/21 0354) Weight:  [79.833 kg (176 lb)] 176 lb (79.833 kg) (12/21 0354)  Intake/Output from previous day:  Intake/Output Summary (Last 24 hours) at 02/01/11 0756 Last data filed at 02/01/11 0700  Gross per 24 hour  Intake   1640 ml  Output   1175 ml  Net    465 ml    Physical Exam: General appearance: alert, cooperative and no distress Lungs: clear to auscultation bilaterally Heart: regular rate and rhythm Abdomen: not distended, positive bowel sounds   Rate: 80  Rhythm: normal sinus rhythm  Lab Results:  Basename 02/01/11 0109 01/31/11 0804  WBC 18.3* 16.6*  HGB 16.0 15.8  PLT 261 261    Basename 02/01/11 0109 01/31/11 0804  NA 137 139  K 3.3* 3.7  CL 105 106  CO2 22 21  GLUCOSE 113* 146*  BUN 38* 47*  CREATININE 1.15 1.24    Basename 02/01/11 0109 01/31/11 1639  TROPONINI <0.30 <0.30   Hepatic Function Panel No results found for this basename: PROT,ALBUMIN,AST,ALT,ALKPHOS,BILITOT,BILIDIR,IBILI in the last 72 hours No results found for this basename: CHOL in the last 72 hours No results found for this basename: INR in the last 72 hours  Imaging:   Cardiac Studies:  Assessment/Plan:   Principal Problem:  *Cholecystitis, acute, s/p biliary drain this adm  Active Problems:  NSVT (nonsustained ventricular tachycardia), 25bts  CAD, CFX MI Rx'd medically 6/11  SBO (small bowel obstruction), post op  PAF (paroxysmal atrial fibrillation), in setting of sepsis this adm Rx'd with Amio  Hypokalemia, 3.3  HYPERTENSION  DIVERTICULOSIS, COLON, WITH HEMORRHAGE, surg 12/11   Dyslipidemia  BPH (benign prostatic  hyperplasia)  Plan-Troponin negative x2. Replace K+, consider resuming Cardura for HTN and BPH when he can take po's. He might also beneffit from Flomax or Proscar. Will follow.    Kerin Ransom PA-C 02/01/2011, 7:56 AM

## 2011-02-01 NOTE — Progress Notes (Signed)
Pt. Seen and examined. Agree with the NP/PA-C note as written.  Cardiac enzymes negative. No chest pain or dyspnea. Issues revolve around infection. Agree with aggressive K and Mg supplementation - goal to keep K > 4.5 and Mg > 2.0.  Agree with b-blocker. Would be appropriate for outpatient myoview once infection has resolved in our office.   Will sign-off. Available as needed.  Pixie Casino, MD Attending Cardiologist [facility]

## 2011-02-01 NOTE — Progress Notes (Signed)
Patient ID: Gary [name], male   DOB: 11/29/1932, 77 y.o.   MRN: [id number]     Subjective: Patient seen.Complain about poor stream of urine.Denies any dysuria.  Objective: Blood pressure 138/74, pulse 56, temperature 97.5 F (36.4 C), temperature source Oral, resp. rate 20, height 5\' 8"  (1.727 m), weight 79.833 kg (176 lb), SpO2 96.00%.  Intake/Output Summary (Last 24 hours) at 02/01/11 1137 Last data filed at 02/01/11 0936  Gross per 24 hour  Intake   1880 ml  Output   1325 ml  Net    555 ml   General exam: chronically ill looking,no distress,pallor Neck-no jvd Chest-clear cvs-s1 and s2 Abd-soft,in-dwelling biliary drain and flexseal,slight generalised tenderness,bowel sounds hypoactive Ext-no pedal edema Neuro-non focal Skin-no ecchymoses   Lab Results: Basic Metabolic Panel:  Basename 02/01/11 0109 01/31/11 0804 01/30/11 0535  NA 137 139 --  K 3.3* 3.7 --  CL 105 106 --  CO2 22 21 --  GLUCOSE 113* 146* --  BUN 38* 47* --  CREATININE 1.15 1.24 --  CALCIUM 8.2* 8.3* --  MG -- -- 2.2  PHOS -- -- 3.4   Liver Function Tests: No results found for this basename: AST:2,ALT:2,ALKPHOS:2,BILITOT:2,PROT:2,ALBUMIN:2 in the last 72 hours No results found for this basename: LIPASE:2,AMYLASE:2 in the last 72 hours No results found for this basename: AMMONIA:2 in the last 72 hours CBC:  Basename 02/01/11 0109 01/31/11 0804  WBC 18.3* 16.6*  NEUTROABS 15.9* 14.1*  HGB 16.0 15.8  HCT 45.6 44.8  MCV 87.4 87.8  PLT 261 261   CBG:  Basename 02/01/11 0632 01/31/11 0556 01/30/11 0637  GLUCAP 84 161* 140*   Thyroid Function Tests: No results found for this basename: TSH,T4TOTAL,FREET4,T3FREE,THYROIDAB in the last 72 hours Urine Drug Screen: Drugs of Abuse     Component Value Date/Time   LABOPIA NONE DETECTED 08/09/2009 0521   COCAINSCRNUR NONE DETECTED 08/09/2009 0521   LABBENZ NONE DETECTED 08/09/2009 0521   AMPHETMU NONE DETECTED 08/09/2009 0521   THCU NONE DETECTED  08/09/2009 0521   LABBARB  Value: NONE DETECTED        DRUG SCREEN FOR MEDICAL PURPOSES ONLY.  IF CONFIRMATION IS NEEDED FOR ANY PURPOSE, NOTIFY LAB WITHIN 5 DAYS.        LOWEST DETECTABLE LIMITS FOR URINE DRUG SCREEN Drug Class       Cutoff (ng/mL) Amphetamine      1000 Barbiturate      200 Benzodiazepine   [id number] Tricyclics       [id number] Opiates          300 Cocaine          300 THC              50 08/09/2009 0521     Micro Results: Recent Results (from the past 240 hour(s))  CULTURE, BLOOD (ROUTINE X 2)     Status: Normal   Collection Time   01/22/11 10:07 PM      Component Value Range Status Comment   Specimen Description BLOOD RIGHT ARM   Final    Special Requests BOTTLES DRAWN AEROBIC AND ANAEROBIC 10CC   Final    Setup Time [id number]   Final    Culture NO GROWTH 5 DAYS   Final    Report Status 01/29/2011 FINAL   Final   CULTURE, BLOOD (ROUTINE X 2)     Status: Normal   Collection Time   01/22/11 10:15 PM      Component Value Range Status Comment  Specimen Description BLOOD RIGHT HAND   Final    Special Requests BOTTLES DRAWN AEROBIC AND ANAEROBIC 10CC   Final    Setup Time [id number]   Final    Culture NO GROWTH 5 DAYS   Final    Report Status 01/29/2011 FINAL   Final   URINE CULTURE     Status: Normal   Collection Time   01/23/11  7:00 AM      Component Value Range Status Comment   Specimen Description URINE, RANDOM   Final    Special Requests NONE   Final    Setup Time [id number]   Final    Colony Count NO GROWTH   Final    Culture NO GROWTH   Final    Report Status 01/24/2011 FINAL   Final   URINE CULTURE     Status: Normal   Collection Time   01/24/11  3:13 PM      Component Value Range Status Comment   Specimen Description URINE, CLEAN CATCH   Final    Special Requests NONE   Final    Setup Time [id number]   Final    Colony Count NO GROWTH   Final    Culture NO GROWTH   Final    Report Status 01/25/2011 FINAL   Final   MRSA PCR SCREENING     Status: Normal    Collection Time   01/26/11  5:35 AM      Component Value Range Status Comment   MRSA by PCR NEGATIVE  NEGATIVE  Final   CULTURE, BLOOD (ROUTINE X 2)     Status: Normal   Collection Time   01/26/11  9:00 AM      Component Value Range Status Comment   Specimen Description BLOOD   Final    Special Requests     Final    Value: BOTTLES DRAWN AEROBIC AND ANAEROBIC 5CC FROM TRIPLE LUMEN CATH LT IJ   Setup Time [id number]   Final    Culture NO GROWTH 5 DAYS   Final    Report Status 02/01/2011 FINAL   Final   CULTURE, BLOOD (ROUTINE X 2)     Status: Normal   Collection Time   01/26/11 10:00 AM      Component Value Range Status Comment   Specimen Description BLOOD LEFT ARM   Final    Special Requests BOTTLES DRAWN AEROBIC AND ANAEROBIC 5CC   Final    Setup Time [id number]   Final    Culture NO GROWTH 5 DAYS   Final    Report Status 02/01/2011 FINAL   Final   CULTURE, ROUTINE-ABSCESS     Status: Normal   Collection Time   01/26/11  3:00 PM      Component Value Range Status Comment   Specimen Description ABSCESS GALL BLADDER   Final    Special Requests PATIENT ON FOLLOWING VANCOMYCIN ZOSYN   Final    Gram Stain     Final    Value: NO WBC SEEN     NO SQUAMOUS EPITHELIAL CELLS SEEN     FEW GRAM NEGATIVE RODS   Culture FEW CLOSTRIDIUM PERFRINGENS   Final    Report Status 01/31/2011 FINAL   Final   CULTURE, ROUTINE-ABSCESS     Status: Normal   Collection Time   01/26/11  8:37 PM      Component Value Range Status Comment   Specimen Description ABSCESS GALL BLADDER FLUID   Final    Special  Requests NONE   Final    Gram Stain     Final    Value: ABUNDANT WBC PRESENT, PREDOMINANTLY PMN     NO SQUAMOUS EPITHELIAL CELLS SEEN     ABUNDANT GRAM VARIABLE ROD   Culture ABUNDANT KLEBSIELLA SPECIES   Final    Report Status 01/30/2011 FINAL   Final    Organism ID, Bacteria KLEBSIELLA SPECIES   Final   CLOSTRIDIUM DIFFICILE BY PCR     Status: Normal   Collection Time   01/31/11  5:36 AM       Component Value Range Status Comment   C difficile by pcr NEGATIVE  NEGATIVE  Final       Medications: Scheduled Meds:    . cefTRIAXone (ROCEPHIN)  IV  1 g Intravenous Q24H  . enoxaparin  40 mg Subcutaneous Q24H  . fluticasone  2 spray Each Nare Daily  . metoprolol  5 mg Intravenous Q4H  . pantoprazole (PROTONIX) IV  40 mg Intravenous Q12H  . potassium chloride  10 mEq Intravenous Q1 Hr x 2  . potassium chloride  10 mEq Intravenous Q1 Hr x 3   Continuous Infusions:    . dextrose 40 mL/hr at 01/31/11 2041   PRN Meds:.acetaminophen, albuterol, fentaNYL, hydrALAZINE, LORazepam, promethazine  Assessment/Plan: 1. Severe sepsis/Klebsiella biliary sepsis: Status post biliary drain. Continue ceftriaxone.Will add flagyl to regimen 2. Small bowel obstruction versus ileus: Management per surgery.  Patient has a flexiseal in situ and started on clear liquids 3. Hypokalemia: k-low.will give k-ryder 4. A. fib with rapid ventricular rate:Rate now low.Mainy metabolic 5. Mild respiratory Distress-resolved 6. Acute on chronic kidney disease. Bun/cr trending down 7. BPH-will start flomax and cardura 8. Deconditioning-physical therapy.   Jolanda Mccann 02/01/2011, 11:37 AM

## 2011-02-01 NOTE — Progress Notes (Signed)
WBC rising. Not sure rocephin is best choice. Would consider switching to something like Cipro/Flagyl that would cover GI bugs better

## 2011-02-01 NOTE — Progress Notes (Signed)
Occupational Therapy Treatment Patient Details Name: QUENTON SHINALL MRN: [id number] DOB: 1932-06-26 Today's Date: 02/01/2011  OT Assessment/Plan OT Assessment/Plan Comments on Treatment Session: Pt. very motivated for OOB ADLs. Pt. with increased activity tolerance, however pt. not up to baseline level of endurance. OT Plan: Discharge plan remains appropriate OT Frequency: Min 2X/week Follow Up Recommendations: Inpatient Rehab Equipment Recommended: Rolling walker with 5" wheels;3 in 1 bedside comode OT Goals Acute Rehab OT Goals OT Goal Formulation: With patient/family Time For Goal Achievement: 2 weeks ADL Goals Pt Will Perform Grooming: with set-up;with supervision;Standing at sink ADL Goal: Grooming - Progress: Progressing toward goals Pt Will Perform Lower Body Bathing: with set-up;Sit to stand from bed ADL Goal: Lower Body Bathing - Progress: Not addressed Pt Will Perform Lower Body Dressing: with set-up;Unsupported;Sit to stand from bed;with adaptive equipment ADL Goal: Lower Body Dressing - Progress: Not addressed Pt Will Transfer to Toilet: with supervision;3-in-1;Stand pivot transfer ADL Goal: Toilet Transfer - Progress: Progressing toward goals Pt Will Perform Toileting - Hygiene: with set-up;Leaning right and/or left on 3-in-1/toilet ADL Goal: Toileting - Hygiene - Progress: Progressing toward goals Additional ADL Goal #1: Pt. will recall 2 energy conservation techniques to use with ADLs ADL Goal: Additional Goal #1 - Progress: Progressing toward goals  OT Treatment Precautions/Restrictions  Precautions Precautions: Fall Restrictions Weight Bearing Restrictions: No   ADL ADL Toilet Transfer: Simulated;Moderate assistance Toilet Transfer Details (indicate cue type and reason): Mod verbal cues for hand placement on RW and bed to facilitate transfer Toilet Transfer Method: Stand pivot Toilet Transfer Equipment: Other (comment) (recliner) Toileting - Clothing  Manipulation: Performed;Maximal assistance Toileting - Clothing Manipulation Details (indicate cue type and reason): With moving gown Where Assessed - Toileting Clothing Manipulation: Standing Toileting - Hygiene: Performed;Maximal assistance Toileting - Hygiene Details (indicate cue type and reason): Pt. incontinent of bowels and provided hygiene assist due to unable to twist to reach backside because of bilateral UE dependency on RW Where Assessed - Toileting Hygiene: Standing Tub/Shower Transfer: Not assessed Tub/Shower Transfer Method: Not assessed Equipment Used: Rolling walker ADL Comments: Pt. very motivated for oob activity today. Pt. with need for use of urinal and completed standing EOB for ~72mins while MD then present talking to pt. Pt. then completed ~4' to chair with RW and mod verbal cues for use of RW. Pt. will continue to benefit from energy conservation techniques and LB ADL education. Pt's wife present and pt. educated on need for increased rehab prior to D/C home to increase activity tolerance.  Mobility  Bed Mobility Bed Mobility: Yes Rolling Right: 4: Min assist;With rail Rolling Right Details (indicate cue type and reason): Min verbal cues to initiate hand placement for roll Supine to Sit: 4: Min assist;HOB elevated (Comment degrees) Transfers Transfers: Yes Sit to Stand: 4: Min assist;From elevated surface;From bed;With upper extremity assist Sit to Stand Details (indicate cue type and reason): Manual facilitation to initiate anterior weightshift to complete. Exercises    End of Session OT - End of Session Equipment Utilized During Treatment: Gait belt Activity Tolerance: Patient tolerated treatment well Patient left: in chair;with call bell in reach;with family/visitor present Nurse Communication: Mobility status for transfers General Behavior During Session: [facility] for tasks performed Cognition: [facility] for tasks performed Cognitive Impairment: Pt slow to process at  times and difficulty with problem solving  Karel Mowers,OTR/L Pager [phone]  02/01/2011, 2:40 PM

## 2011-02-01 NOTE — Progress Notes (Signed)
Patient to begin full liquid diet today. I will follow up Monday to consider him for inpatient acute rehab pending his progress with therapy if it is needed. Please call me on pager [phone] with questions.

## 2011-02-01 NOTE — Progress Notes (Signed)
Subjective: Pt feeling better without NG. No N/V. Continued output from flexiseal. Pt hungry, wants to try to eat.  Objective: Vital signs in last 24 hours: Temp:  [97.5 F (36.4 C)-97.9 F (36.6 C)] 97.5 F (36.4 C) (12/21 0354) Pulse Rate:  [53-62] 56  (12/21 0354) Resp:  [20] 20  (12/21 0354) BP: (138-155)/(74-88) 138/74 mmHg (12/21 0354) SpO2:  [96 %-97 %] 96 % (12/21 0354) Weight:  [79.833 kg (176 lb)] 176 lb (79.833 kg) (12/21 0354) Last BM Date: 02/01/11  Intake/Output this shift: Total I/O In: 240 [P.O.:240] Out: 75 [Drains:75]  Physical Exam: BP 138/74  Pulse 56  Temp(Src) 97.5 F (36.4 C) (Oral)  Resp 20  Ht 5\' 8"  (1.727 m)  Wt 79.833 kg (176 lb)  BMI 26.76 kg/m2  SpO2 96% Abdomen: soft, ND, NT Few BS. Chole drain intact with strandy biliuos output.  Labs: CBC  Basename 02/01/11 0109 01/31/11 0804  WBC 18.3* 16.6*  HGB 16.0 15.8  HCT 45.6 44.8  PLT 261 261   BMET  Basename 02/01/11 0109 01/31/11 0804  NA 137 139  K 3.3* 3.7  CL 105 106  CO2 22 21  GLUCOSE 113* 146*  BUN 38* 47*  CREATININE 1.15 1.24  CALCIUM 8.2* 8.3*   LFT No results found for this basename: PROT,ALBUMIN,AST,ALT,ALKPHOS,BILITOT,BILIDIR,IBILI,LIPASE in the last 72 hours PT/INR No results found for this basename: LABPROT:2,INR:2 in the last 72 hours ABG No results found for this basename: PHART:2,PCO2:2,PO2:2,HCO3:2 in the last 72 hours  Studies/Results: Dg Abd 1 View  01/31/2011  *RADIOLOGY REPORT*  Clinical Data: 77 year old male with possible ileus.  NG tube.  ABDOMEN - 1 VIEW  Comparison: 01/30/2011 and earlier.  Findings: Lower lumbar and sacral spine hardware re-identified and stable.  Mildly decreased bowel gas.  No dilated loops.  Bowel wall thickening in the left upper quadrant and right abdomen.  Enteric tube not identified on this image. Stable visualized osseous structures.  IMPRESSION: Bowel wall thickening in the right abdomen and left upper quadrant. No  dilated bowel loops are evident.  Original Report Authenticated By: Randall An, M.D.   Dg Abd Acute W/chest  01/30/2011  *RADIOLOGY REPORT*  Clinical Data: Leukocytosis and ileus. Previous colon resection.  ACUTE ABDOMEN SERIES (ABDOMEN 2 VIEW & CHEST 1 VIEW)  Comparison: CT 01/25/2011 and abdominal series 01/25/2011  Findings: Chest radiograph demonstrates a left jugular central venous catheter tip in the upper SVC region.  Elevation of the right hemidiaphragm.  Nasogastric tube in the stomach fundus region.  There is a cholecystostomy tube in the right upper quadrant of the abdomen.  Decubitus image demonstrates small bowel gas throughout the abdomen.  No evidence for free air. The degree of small bowel gaseous distention has decreased since 01/25/2011. Postsurgical changes in the lower lumbar spine.  Question a small nodule in the lateral left lung that roughly measures 5 mm.  This most likely represents overlying shadows.  IMPRESSION: Dilated loops of small bowel throughout the abdomen. The degree of distention has decreased since 01/25/2011.  Low lung volumes.  Question a nodular density in the left lung probably representing overlying shadows.  Recommend attention this area on followup imaging.  Original Report Authenticated By: Markus Daft, M.D.    Assessment: Principal Problem:  *Cholecystitis, acute, s/p biliary drain this adm Active Problems:  HYPERTENSION  CAD, CFX MI Rx'd medically 6/11  DIVERTICULOSIS, COLON, WITH HEMORRHAGE, surg 12/11   Dyslipidemia  SBO (small bowel obstruction), post op  NSVT (nonsustained ventricular tachycardia)  PAF (paroxysmal atrial fibrillation), in setting of sepsis this adm Rx'd with Amio  BPH (benign prostatic hyperplasia)  Hypokalemia   Procedure(s): LAPAROSCOPIC CHOLECYSTECTOMY WITH INTRAOPERATIVE CHOLANGIOGRAM  Plan: WIll increase diet some. Hopefully as tolerated to regular.  LOS: 10 days    Federico Flake 02/01/2011

## 2011-02-02 DIAGNOSIS — E871 Hypo-osmolality and hyponatremia: Secondary | ICD-10-CM | POA: Diagnosis not present

## 2011-02-02 DIAGNOSIS — D72829 Elevated white blood cell count, unspecified: Secondary | ICD-10-CM | POA: Diagnosis not present

## 2011-02-02 LAB — GLUCOSE, CAPILLARY: Glucose-Capillary: 150 mg/dL — ABNORMAL HIGH (ref 70–99)

## 2011-02-02 LAB — COMPREHENSIVE METABOLIC PANEL
AST: 24 U/L (ref 0–37)
Albumin: 2.2 g/dL — ABNORMAL LOW (ref 3.5–5.2)
Calcium: 8.2 mg/dL — ABNORMAL LOW (ref 8.4–10.5)
Creatinine, Ser: 1.16 mg/dL (ref 0.50–1.35)

## 2011-02-02 LAB — CBC
MCH: 29.7 pg (ref 26.0–34.0)
MCV: 87.2 fL (ref 78.0–100.0)
Platelets: 261 10*3/uL (ref 150–400)
RDW: 13.8 % (ref 11.5–15.5)
WBC: 16.3 10*3/uL — ABNORMAL HIGH (ref 4.0–10.5)

## 2011-02-02 LAB — MAGNESIUM: Magnesium: 2.1 mg/dL (ref 1.5–2.5)

## 2011-02-02 MED ORDER — POTASSIUM CHLORIDE CRYS ER 20 MEQ PO TBCR
40.0000 meq | EXTENDED_RELEASE_TABLET | Freq: Two times a day (BID) | ORAL | Status: AC
  Administered 2011-02-02 (×2): 40 meq via ORAL
  Filled 2011-02-02: qty 2

## 2011-02-02 MED ORDER — SODIUM CHLORIDE 0.9 % IV SOLN
INTRAVENOUS | Status: DC
  Administered 2011-02-02 – 2011-02-03 (×2): via INTRAVENOUS

## 2011-02-02 MED ORDER — NEBIVOLOL HCL 2.5 MG PO TABS
2.5000 mg | ORAL_TABLET | Freq: Every day | ORAL | Status: DC
  Administered 2011-02-02 – 2011-02-04 (×3): 2.5 mg via ORAL
  Filled 2011-02-02 (×3): qty 1

## 2011-02-02 NOTE — Significant Event (Signed)
Pt c/o severe pain to rectum Dr Aileen Fass called order recived to d/c rectal tube. Rectal tube d/c as per ordered pt stated feels immediate relief from rectal pain.

## 2011-02-02 NOTE — Progress Notes (Signed)
  Subjective: Pt feeling better, flexiseal out. Tol fulls diet.  Objective: Vital signs in last 24 hours: Temp:  [97.9 F (36.6 C)-98.8 F (37.1 C)] 97.9 F (36.6 C) (12/22 0457) Pulse Rate:  [64-70] 70  (12/22 0457) Resp:  [18] 18  (12/22 0457) BP: (134-194)/(47-76) 134/66 mmHg (12/22 0457) SpO2:  [94 %-96 %] 94 % (12/22 0457) Weight:  [182 lb 15.7 oz (83 kg)] 182 lb 15.7 oz (83 kg) (12/22 0457) Last BM Date: 02/01/11  Intake/Output this shift: Total I/O In: 360 [P.O.:360] Out: -   Physical Exam: BP 134/66  Pulse 70  Temp(Src) 97.9 F (36.6 C) (Oral)  Resp 18  Ht 5\' 8"  (1.727 m)  Wt 182 lb 15.7 oz (83 kg)  BMI 27.82 kg/m2  SpO2 94% ABdomen: soft, ND, NT  Labs: CBC  Basename 02/02/11 0600 02/01/11 0109  WBC 16.3* 18.3*  HGB 14.8 16.0  HCT 43.5 45.6  PLT 261 261   BMET  Basename 02/02/11 0600 02/01/11 0109  NA 133* 137  K 3.8 3.3*  CL 103 105  CO2 20 22  GLUCOSE 167* 113*  BUN 25* 38*  CREATININE 1.16 1.15  CALCIUM 8.2* 8.2*   LFT  Basename 02/02/11 0600  PROT 5.6*  ALBUMIN 2.2*  AST 24  ALT 22  ALKPHOS 69  BILITOT 0.5  BILIDIR --  IBILI --  LIPASE --   PT/INR No results found for this basename: LABPROT:2,INR:2 in the last 72 hours ABG No results found for this basename: PHART:2,PCO2:2,PO2:2,HCO3:2 in the last 72 hours  Studies/Results: No results found.  Assessment: Principal Problem:  *Cholecystitis, acute, s/p biliary drain this adm Active Problems:  HYPERTENSION  CAD, CFX MI Rx'd medically 6/11  DIVERTICULOSIS, COLON, WITH HEMORRHAGE, surg 12/11   Dyslipidemia  SBO (small bowel obstruction), post op  NSVT (nonsustained ventricular tachycardia)  PAF (paroxysmal atrial fibrillation), in setting of sepsis this adm Rx'd with Amio  BPH (benign prostatic hyperplasia)  Hypokalemia   Procedure(s): LAPAROSCOPIC CHOLECYSTECTOMY WITH INTRAOPERATIVE CHOLANGIOGRAM  Plan: Advance to reg diet. WBC down some Reminder of pt hx of  subtotal colectomy, expect him to continue to have more frequent and loose stools. COntinue perc chole drain, will need to see Dr. Ninfa Linden after DC  LOS: 11 days    Dollye Glasser J 02/02/2011

## 2011-02-02 NOTE — Progress Notes (Signed)
Subjective: Patient relates feeling much better has no new complaints at this time. He still has some soreness at the site where they put the drain. Objective: Filed Vitals:   02/01/11 1304 02/01/11 2147 02/02/11 0303 02/02/11 0457  BP: 141/76 194/47  134/66  Pulse: 64 66  70  Temp: 98.8 F (37.1 C) 98.4 F (36.9 C)  97.9 F (36.6 C)  TempSrc: Oral     Resp: 18 18  18   Height:      Weight:   83 kg (182 lb 15.7 oz) 83 kg (182 lb 15.7 oz)  SpO2: 95% 96%  94%   Weight change: 3.167 kg (6 lb 15.7 oz)  Intake/Output Summary (Last 24 hours) at 02/02/11 1042 Last data filed at 02/02/11 0851  Gross per 24 hour  Intake   1620 ml  Output   1500 ml  Net    120 ml    General: Alert, awake, oriented x3, in no acute distress.  HEENT: No bruits, no goiter.  Heart: Regular rate and rhythm, without murmurs, rubs, gallops.  Lungs: Good air movement and clear to auscultation.  Abdomen: Positive bowel sounds soft mild right upper quadrant tenderness no rebound or guarding drain in place no erythema or drainage. Neuro: Grossly intact, nonfocal.   Lab Results:  Basename 02/02/11 0600 02/01/11 0109  NA 133* 137  K 3.8 3.3*  CL 103 105  CO2 20 22  GLUCOSE 167* 113*  BUN 25* 38*  CREATININE 1.16 1.15  CALCIUM 8.2* 8.2*  MG -- --  PHOS -- --    Basename 02/02/11 0600  AST 24  ALT 22  ALKPHOS 69  BILITOT 0.5  PROT 5.6*  ALBUMIN 2.2*    Basename 02/02/11 0600 02/01/11 0109 01/31/11 0804  WBC 16.3* 18.3* --  NEUTROABS -- 15.9* 14.1*  HGB 14.8 16.0 --  HCT 43.5 45.6 --  MCV 87.2 87.4 --  PLT 261 261 --    Basename 02/01/11 0109 01/31/11 1639  CKTOTAL -- --  CKMB -- --  CKMBINDEX -- --  TROPONINI <0.30 <0.30    Micro Results: Recent Results (from the past 240 hour(s))  URINE CULTURE     Status: Normal   Collection Time   01/24/11  3:13 PM      Component Value Range Status Comment   Specimen Description URINE, CLEAN CATCH   Final    Special Requests NONE   Final    Setup Time [id number]   Final    Colony Count NO GROWTH   Final    Culture NO GROWTH   Final    Report Status 01/25/2011 FINAL   Final   MRSA PCR SCREENING     Status: Normal   Collection Time   01/26/11  5:35 AM      Component Value Range Status Comment   MRSA by PCR NEGATIVE  NEGATIVE  Final   CULTURE, BLOOD (ROUTINE X 2)     Status: Normal   Collection Time   01/26/11  9:00 AM      Component Value Range Status Comment   Specimen Description BLOOD   Final    Special Requests     Final    Value: BOTTLES DRAWN AEROBIC AND ANAEROBIC 5CC FROM TRIPLE LUMEN CATH LT IJ   Setup Time [id number]   Final    Culture NO GROWTH 5 DAYS   Final    Report Status 02/01/2011 FINAL   Final   CULTURE, BLOOD (ROUTINE X 2)  Status: Normal   Collection Time   01/26/11 10:00 AM      Component Value Range Status Comment   Specimen Description BLOOD LEFT ARM   Final    Special Requests BOTTLES DRAWN AEROBIC AND ANAEROBIC 5CC   Final    Setup Time [id number]   Final    Culture NO GROWTH 5 DAYS   Final    Report Status 02/01/2011 FINAL   Final   CULTURE, ROUTINE-ABSCESS     Status: Normal   Collection Time   01/26/11  3:00 PM      Component Value Range Status Comment   Specimen Description ABSCESS GALL BLADDER   Final    Special Requests PATIENT ON FOLLOWING VANCOMYCIN ZOSYN   Final    Gram Stain     Final    Value: NO WBC SEEN     NO SQUAMOUS EPITHELIAL CELLS SEEN     FEW GRAM NEGATIVE RODS   Culture FEW CLOSTRIDIUM PERFRINGENS   Final    Report Status 01/31/2011 FINAL   Final   CULTURE, ROUTINE-ABSCESS     Status: Normal   Collection Time   01/26/11  8:37 PM      Component Value Range Status Comment   Specimen Description ABSCESS GALL BLADDER FLUID   Final    Special Requests NONE   Final    Gram Stain     Final    Value: ABUNDANT WBC PRESENT, PREDOMINANTLY PMN     NO SQUAMOUS EPITHELIAL CELLS SEEN     ABUNDANT GRAM VARIABLE ROD   Culture ABUNDANT KLEBSIELLA SPECIES   Final     Report Status 01/30/2011 FINAL   Final    Organism ID, Bacteria KLEBSIELLA SPECIES   Final   CLOSTRIDIUM DIFFICILE BY PCR     Status: Normal   Collection Time   01/31/11  5:36 AM      Component Value Range Status Comment   C difficile by pcr NEGATIVE  NEGATIVE  Final     Studies/Results: No results found.  Medications: I have reviewed the patient's current medications.   Principal Problem:  *Cholecystitis, acute, s/p biliary drain this adm Active Problems:  HYPERTENSION  CAD, CFX MI Rx'd medically 6/11  DIVERTICULOSIS, COLON, WITH HEMORRHAGE, surg 12/11   Dyslipidemia  SBO (small bowel obstruction), post op  NSVT (nonsustained ventricular tachycardia)  PAF (paroxysmal atrial fibrillation), in setting of sepsis this adm Rx'd with Amio  BPH (benign prostatic hyperplasia)  Hypokalemia    Assessment and plan: Gary [name] is an 77 y.o. male with PMH of CAD, hypertension, dyslipidemia diveritcular bleed, and colectomy was admitted on 01/22/2011 for abdominal pain. Initially he was assessed as having gastroenteritis versus ileus versus small bowel obstruction. Surgery is following. CT of abdomen with gallbladder thickening and on 12/15 he became confused, tachycardic, and was transfer to SDU. Perc chole drain placed by IR. Fluid Cultures grew Klebsiella & CLOSTRIDIUM PERFRINGENS  and  Course complicated by AF / RVR requiring amio - now stopped due to bradycardia currently on scheduled IV metoprolol for this.  -Continue Rocephin and Flagyl, DC Foley continue to monitor in telemetry vitals have been stable. Advance diet as tolerated. Question to surgery and can restart aspirin. We'll continue start nibivolol and his heart rate is trending up. Agree with advancing diet.  -Patient has a total colectomy he has 5-6 bowel movements a day. I removed the flexiseal.    -Small bowel obstruction resolved, patient having as he would describe normal output  of the stool.  -Acute on chronic renal  failure resolved, creatinine at baseline.  LOS: 11 days   Charlynne Cousins M.D. Pager: [phone] [facility] 02/02/2011, 10:42 AM

## 2011-02-03 LAB — BASIC METABOLIC PANEL
BUN: 24 mg/dL — ABNORMAL HIGH (ref 6–23)
CO2: 19 mEq/L (ref 19–32)
Calcium: 8.9 mg/dL (ref 8.4–10.5)
Creatinine, Ser: 1.42 mg/dL — ABNORMAL HIGH (ref 0.50–1.35)
Glucose, Bld: 129 mg/dL — ABNORMAL HIGH (ref 70–99)
Sodium: 133 mEq/L — ABNORMAL LOW (ref 135–145)

## 2011-02-03 LAB — GLUCOSE, CAPILLARY: Glucose-Capillary: 147 mg/dL — ABNORMAL HIGH (ref 70–99)

## 2011-02-03 MED ORDER — PANTOPRAZOLE SODIUM 40 MG PO TBEC
40.0000 mg | DELAYED_RELEASE_TABLET | Freq: Two times a day (BID) | ORAL | Status: DC
  Administered 2011-02-04: 40 mg via ORAL
  Filled 2011-02-03: qty 1

## 2011-02-03 MED ORDER — METRONIDAZOLE 500 MG PO TABS
500.0000 mg | ORAL_TABLET | Freq: Three times a day (TID) | ORAL | Status: DC
  Administered 2011-02-03 – 2011-02-04 (×3): 500 mg via ORAL
  Filled 2011-02-03 (×6): qty 1

## 2011-02-03 MED ORDER — CIPROFLOXACIN HCL 500 MG PO TABS
500.0000 mg | ORAL_TABLET | Freq: Two times a day (BID) | ORAL | Status: DC
  Administered 2011-02-03 – 2011-02-04 (×3): 500 mg via ORAL
  Filled 2011-02-03 (×6): qty 1

## 2011-02-03 NOTE — Procedures (Signed)
Tolerated well [place]

## 2011-02-03 NOTE — Progress Notes (Signed)
  Subjective: Looks better, feels better, still having loose BMs, but is now eating solids diet. Denies much pain  Objective: Vital signs in last 24 hours: Temp:  [97.4 F (36.3 C)-98.4 F (36.9 C)] 98.3 F (36.8 C) (12/23 0630) Pulse Rate:  [72-85] 85  (12/23 0630) Resp:  [18-92] 20  (12/23 0630) BP: (140-144)/(62-88) 140/67 mmHg (12/23 0630) SpO2:  [95 %-98 %] 98 % (12/23 0630) Weight:  [180 lb 9.6 oz (81.92 kg)] 180 lb 9.6 oz (81.92 kg) (12/23 0641) Last BM Date: 02/03/11  Intake/Output this shift:    Physical Exam: BP 140/67  Pulse 85  Temp(Src) 98.3 F (36.8 C) (Oral)  Resp 20  Ht 5\' 8"  (1.727 m)  Wt 180 lb 9.6 oz (81.92 kg)  BMI 27.46 kg/m2  SpO2 98% Abdomen: no distention, chole drain intact, mix of bile and purulence within. Otherwise soft, NT  Labs: CBC  Basename 02/02/11 0600 02/01/11 0109  WBC 16.3* 18.3*  HGB 14.8 16.0  HCT 43.5 45.6  PLT 261 261   BMET  Basename 02/03/11 0646 02/02/11 0600  NA 133* 133*  K 4.5 3.8  CL 103 103  CO2 19 20  GLUCOSE 129* 167*  BUN 24* 25*  CREATININE 1.42* 1.16  CALCIUM 8.9 8.2*   LFT  Basename 02/02/11 0600  PROT 5.6*  ALBUMIN 2.2*  AST 24  ALT 22  ALKPHOS 69  BILITOT 0.5  BILIDIR --  IBILI --  LIPASE --   PT/INR No results found for this basename: LABPROT:2,INR:2 in the last 72 hours ABG No results found for this basename: PHART:2,PCO2:2,PO2:2,HCO3:2 in the last 72 hours  Studies/Results: No results found.  Assessment: Principal Problem:  *Cholecystitis, acute, s/p biliary drain this adm Active Problems:  HYPERTENSION  CAD, CFX MI Rx'd medically 6/11  Dyslipidemia  BPH (benign prostatic hyperplasia)  Hyponatremia   Plan: Continue encourage po intake. Bedside commode as BMs coming short notice. Needs to follow up with Dr. Dalbert Batman for gb as that is who operated on him in the past.  LOS: 12 days    Gary [name] J 02/03/2011

## 2011-02-03 NOTE — Plan of Care (Signed)
Problem: Phase II Progression Outcomes Goal: Progress activity as tolerated unless otherwise ordered Outcome: Progressing Patient oob to chair with assist. Tolerated fair.

## 2011-02-03 NOTE — Progress Notes (Signed)
Subjective: Patient relates feeling much better has no new complaints. Tolerating diet  Objective: Filed Vitals:   02/02/11 1400 02/02/11 2132 02/03/11 0630 02/03/11 0641  BP: 144/88 141/62 140/67   Pulse: 72 81 85   Temp: 97.4 F (36.3 C) 98.4 F (36.9 C) 98.3 F (36.8 C)   TempSrc: Oral Oral Oral   Resp: 92 18 20   Height:      Weight:    81.92 kg (180 lb 9.6 oz)  SpO2: 95% 97% 98%    Weight change: -1.08 kg (-2 lb 6.1 oz)  Intake/Output Summary (Last 24 hours) at 02/03/11 1117 Last data filed at 02/03/11 [id number]  Gross per 24 hour  Intake 4522.33 ml  Output   1127 ml  Net 3395.33 ml    General: Alert, awake, oriented x3, in no acute distress.  HEENT: No bruits, no goiter.  Heart: Regular rate and rhythm, without murmurs, rubs, gallops.  Lungs: Good air movement and clear to auscultation.  Abdomen: Positive bowel sounds soft mild right upper quadrant tenderness no rebound or guarding drain in place no erythema or drainage. Neuro: Grossly intact, nonfocal.   Lab Results:  Basename 02/03/11 0646 02/02/11 1213 02/02/11 0600  NA 133* -- 133*  K 4.5 -- 3.8  CL 103 -- 103  CO2 19 -- 20  GLUCOSE 129* -- 167*  BUN 24* -- 25*  CREATININE 1.42* -- 1.16  CALCIUM 8.9 -- 8.2*  MG -- 2.1 --  PHOS -- -- --    Basename 02/02/11 0600  AST 24  ALT 22  ALKPHOS 69  BILITOT 0.5  PROT 5.6*  ALBUMIN 2.2*    Basename 02/02/11 0600 02/01/11 0109  WBC 16.3* 18.3*  NEUTROABS -- 15.9*  HGB 14.8 16.0  HCT 43.5 45.6  MCV 87.2 87.4  PLT 261 261    Basename 02/01/11 0109 01/31/11 1639  CKTOTAL -- --  CKMB -- --  CKMBINDEX -- --  TROPONINI <0.30 <0.30    Micro Results: Recent Results (from the past 240 hour(s))  URINE CULTURE     Status: Normal   Collection Time   01/24/11  3:13 PM      Component Value Range Status Comment   Specimen Description URINE, CLEAN CATCH   Final    Special Requests NONE   Final    Setup Time [id number]   Final    Colony Count NO GROWTH    Final    Culture NO GROWTH   Final    Report Status 01/25/2011 FINAL   Final   MRSA PCR SCREENING     Status: Normal   Collection Time   01/26/11  5:35 AM      Component Value Range Status Comment   MRSA by PCR NEGATIVE  NEGATIVE  Final   CULTURE, BLOOD (ROUTINE X 2)     Status: Normal   Collection Time   01/26/11  9:00 AM      Component Value Range Status Comment   Specimen Description BLOOD   Final    Special Requests     Final    Value: BOTTLES DRAWN AEROBIC AND ANAEROBIC 5CC FROM TRIPLE LUMEN CATH LT IJ   Setup Time [id number]   Final    Culture NO GROWTH 5 DAYS   Final    Report Status 02/01/2011 FINAL   Final   CULTURE, BLOOD (ROUTINE X 2)     Status: Normal   Collection Time   01/26/11 10:00 AM  Component Value Range Status Comment   Specimen Description BLOOD LEFT ARM   Final    Special Requests BOTTLES DRAWN AEROBIC AND ANAEROBIC 5CC   Final    Setup Time [id number]   Final    Culture NO GROWTH 5 DAYS   Final    Report Status 02/01/2011 FINAL   Final   CULTURE, ROUTINE-ABSCESS     Status: Normal   Collection Time   01/26/11  3:00 PM      Component Value Range Status Comment   Specimen Description ABSCESS GALL BLADDER   Final    Special Requests PATIENT ON FOLLOWING VANCOMYCIN ZOSYN   Final    Gram Stain     Final    Value: NO WBC SEEN     NO SQUAMOUS EPITHELIAL CELLS SEEN     FEW GRAM NEGATIVE RODS   Culture FEW CLOSTRIDIUM PERFRINGENS   Final    Report Status 01/31/2011 FINAL   Final   CULTURE, ROUTINE-ABSCESS     Status: Normal   Collection Time   01/26/11  8:37 PM      Component Value Range Status Comment   Specimen Description ABSCESS GALL BLADDER FLUID   Final    Special Requests NONE   Final    Gram Stain     Final    Value: ABUNDANT WBC PRESENT, PREDOMINANTLY PMN     NO SQUAMOUS EPITHELIAL CELLS SEEN     ABUNDANT GRAM VARIABLE ROD   Culture ABUNDANT KLEBSIELLA SPECIES   Final    Report Status 01/30/2011 FINAL   Final    Organism ID, Bacteria  KLEBSIELLA SPECIES   Final   CLOSTRIDIUM DIFFICILE BY PCR     Status: Normal   Collection Time   01/31/11  5:36 AM      Component Value Range Status Comment   C difficile by pcr NEGATIVE  NEGATIVE  Final     Studies/Results: No results found.  Medications: I have reviewed the patient's current medications.   Principal Problem:  *Cholecystitis, acute, s/p biliary drain this adm Active Problems:  HYPERTENSION  CAD, CFX MI Rx'd medically 6/11  Dyslipidemia  BPH (benign prostatic hyperplasia)  Hyponatremia    Assessment and plan: Gary [name] is an 77 y.o. male with PMH of CAD, hypertension, dyslipidemia diveritcular bleed, and colectomy was admitted on 01/22/2011 for abdominal pain. Initially he was assessed as having gastroenteritis versus ileus versus small bowel obstruction. Surgery is following. CT of abdomen with gallbladder thickening and on 12/15 he became confused, tachycardic, and was transfer to SDU. Perc chole drain placed by IR. Fluid Cultures grew Klebsiella & CLOSTRIDIUM PERFRINGENS  and  Course complicated by AF / RVR requiring amio - now stopped due to bradycardia currently on scheduled IV metoprolol for this.  -Change antibiotics to Cipro and Flagyl   -Question to surgery and can restart aspirin? Heart rate controlled.  -Patient has a total colectomy he has 5-6 bowel movements a day. Hyponatremia probably secondary to diarrhea which is chronic secondary to total colectomy.  -Small bowel obstruction resolved.  -Acute on chronic renal failure resolved, creatinine at baseline.   LOS: 12 days   Charlynne Cousins M.D. Pager: [phone] [facility] 02/03/2011, 11:17 AM

## 2011-02-04 MED ORDER — OXYCODONE HCL 5 MG PO TABS: 5.0000 mg | ORAL_TABLET | ORAL | Status: AC | PRN

## 2011-02-04 MED ORDER — METRONIDAZOLE 500 MG PO TABS: 500.0000 mg | ORAL_TABLET | Freq: Three times a day (TID) | ORAL | Status: AC

## 2011-02-04 MED ORDER — TAMSULOSIN HCL 0.4 MG PO CAPS: 0.4000 mg | ORAL_CAPSULE | Freq: Every day | ORAL | Status: DC

## 2011-02-04 MED ORDER — CIPROFLOXACIN HCL 500 MG PO TABS: 500.0000 mg | ORAL_TABLET | Freq: Two times a day (BID) | ORAL | Status: AC

## 2011-02-04 MED ORDER — OMEPRAZOLE 20 MG PO CPDR: 40.0000 mg | DELAYED_RELEASE_CAPSULE | Freq: Every day | ORAL | Status: DC

## 2011-02-04 MED ORDER — ASPIRIN 81 MG PO TBEC: 81.0000 mg | DELAYED_RELEASE_TABLET | Freq: Every day | ORAL | Status: DC

## 2011-02-04 NOTE — Progress Notes (Signed)
Pt's tele d/c, pt's IV d/c. Patient and family verbalizes understanding of discharge information and medications. Pt is going home with home health.____________________________________________________________D. Owens Shark RN

## 2011-02-04 NOTE — Progress Notes (Signed)
02/04/2011 [facility], [place] Case Management Note [id number]    CARE MANAGEMENT NOTE 02/04/2011  Patient:  Gary [name], Gary [name]   Account Number:  [id number]  Date Initiated:  01/23/2011  Documentation initiated by:  Tomi Bamberger  Subjective/Objective Assessment:   dx ileus s/p colectomy  admit- lives with spouse     Action/Plan:   surgery following   Anticipated DC Date:  02/01/2011   Anticipated DC Plan:  [place]  CM consult      [facility] Choice  HOME HEALTH   Choice offered to / List presented to:  C-1 Patient        [place] arranged  HH-1 RN  [place].   Status of service:  Completed, signed off Medicare Important Message given?   (If response is "NO", the following Medicare IM given date fields will be blank) Date Medicare IM given:   Date Additional Medicare IM given:    Discharge Disposition:  Gary [name]  Per UR Regulation:  Reviewed for med. necessity/level of care/duration of stay  Comments:  02/04/11- 1130-J.Triston Skare,RN,BSN [id number]      Patient discharged today before RN NCM could speak to patient regarding choice of agencies and home health services. Phoned patient and spoke with patient and spouse regarding choice of agencies. [facility]. Lelan Pons, RN with [facility] notified of choice for services. Patient called back and notified of [place] set up and phone number. No further needs identified.  01-28-11 12:35pmSarah Brown,RNBSN - 54 [id number] UR Compelted - post op AFib. ?? sepsis  01/23/11 14:23 Tomi Bamberger RN, BSN [phone] patient lives with spouse, s/p colectomy now with sbo, ileus, surgery is following.  NCM will continue to follow for dc needs.

## 2011-02-04 NOTE — Progress Notes (Signed)
02/04/2011 [place] [place] Case Management Note [place]   Agencies that are Medicare-Certified and are affiliated with The [place]  Telephone Number Address  [place] has ownership interest in this company; however, you are under no obligation to use this agency. [phone] or  [place], [place] [address]   Agencies that are Medicare-Certified and are not affiliated with [facility] [place] Telephone Number Address  [facility] [phone] Fax [phone] [address], [place], [place]  [address]  [facility] [phone] or [phone] Fax [phone] [id number] [place], [place] [address]  [facility] [phone] Fax [phone] [place], [place] [address]  [place] [phone] Fax [phone] [place], [place]  [address]  [facility] [phone] Fax [phone] [address], [place], [place] [address]  [facility] [place], [place] [address]  [facility] [phone]  [address], [place] [address]  [facility] [phone] or [phone] Fax [phone] [place] [address], [place] [address] [place], [place]  [address]  [facility] [phone] Fax [phone] [place], [place]  [address]  [place] [facility]  [phone] Fax [phone] [address] [place], [place] [address]               Agencies that are not Medicare-Certified and are not affiliated with The [place] Telephone Number Address  [place] or [phone] Fax [phone] [address] [place]  [address]  [facility] [phone] Fax [phone] [address] [place], [place]  [address]  [facility]  [phone] Fax [phone] [address] [place], [place] [address]  [facility] [phone] Fax [phone] [address] [place], [place] [address]  [facility] [phone] or [phone] Fax [phone] [address], [place], [place]  [address]  [facility] [place] [phone] or [phone] Fax [phone] [address] [place], [place]  [address]  [facility] [phone] Fax [phone] [id number] [place], [place]  [address]  [facility] [phone] [address] [place], [place]  [address]  [place] [phone] Fax [phone] [place], [place]  [address]  [place]. [phone] Fax [phone] [address] [place], [place]  [address]  [facility] [phone] Fax [phone] [address] [place], [place] [address]  [facility] [phone] Fax [phone] [address] [place], [place]  [address]   Patient discharged to home before home health services could be  arranged. Called patient and spouse and spoke with them concerning choice of agencies. [facility]. Lelan Pons, RN will be notified.

## 2011-02-04 NOTE — Progress Notes (Signed)
Patient ID: Gary [name], male   DOB: 07-03-1932, 77 y.o.   MRN: [id number]    Subjective: No complaints.  Tolerating po.  Remains afebrile  Objective: Vital signs in last 24 hours: Temp:  [97.5 F (36.4 C)-98.1 F (36.7 C)] 97.8 F (36.6 C) (12/24 0337) Pulse Rate:  [67-78] 67  (12/24 0337) Resp:  [18-20] 18  (12/24 0337) BP: (116-140)/(56-68) 118/59 mmHg (12/24 0337) SpO2:  [96 %-99 %] 96 % (12/24 0337) Weight:  [181 lb (82.101 kg)] 181 lb (82.101 kg) (12/24 0441) Last BM Date: 02/04/11  Intake/Output this shift:    Physical Exam: BP 118/59  Pulse 67  Temp(Src) 97.8 F (36.6 C) (Oral)  Resp 18  Ht 5\' 8"  (1.727 m)  Wt 181 lb (82.101 kg)  BMI 27.52 kg/m2  SpO2 96% Drain bilous Abdomen non tender  Labs: CBC  Basename 02/02/11 0600  WBC 16.3*  HGB 14.8  HCT 43.5  PLT 261   BMET  Basename 02/03/11 0646 02/02/11 0600  NA 133* 133*  K 4.5 3.8  CL 103 103  CO2 19 20  GLUCOSE 129* 167*  BUN 24* 25*  CREATININE 1.42* 1.16  CALCIUM 8.9 8.2*   LFT  Basename 02/02/11 0600  PROT 5.6*  ALBUMIN 2.2*  AST 24  ALT 22  ALKPHOS 69  BILITOT 0.5  BILIDIR --  IBILI --  LIPASE --   PT/INR No results found for this basename: LABPROT:2,INR:2 in the last 72 hours ABG No results found for this basename: PHART:2,PCO2:2,PO2:2,HCO3:2 in the last 72 hours  Studies/Results: No results found.  Assessment: Principal Problem:  *Cholecystitis, acute, s/p biliary drain this adm Active Problems:  HYPERTENSION  CAD, CFX MI Rx'd medically 6/11  Dyslipidemia  BPH (benign prostatic hyperplasia)  Hyponatremia   Procedure(s): LAPAROSCOPIC CHOLECYSTECTOMY WITH INTRAOPERATIVE CHOLANGIOGRAM  Plan: Ok for d/c from surgical standpoint  LOS: 13 days    Anaiah Mcmannis A 02/04/2011

## 2011-02-04 NOTE — Progress Notes (Signed)
I met with patient and his wife at bedside. They report him ambulating frequently with supervision to the bathroom and plans to discharge home with therapy at home today. Please call me with any questions. Pager [phone].

## 2011-02-04 NOTE — Progress Notes (Signed)
Physical Therapy Cancellation  Patient Details Name: Gary [name] MRN: [id number] DOB: 23-Oct-1932 Today's Date: 02/04/2011   Pt refused PT services today secondary just returned to bed and d/c home today.  Spoke with family and have no further questions for PT.  Discussed getting RW for home however pt and family agreed to wait for HHPT to decide if RW needed at home due to pt now walking in room without RW.  RN stated pt has been up in moving in room with supervision.  As for stairs family stated pt's son will assist pt into home.  Again asked if pt would like to get up with PT one last time to make sure RW is not needed at d/c.  Pt politely refused.  If pt does not d/c today will continue to follow.  Thanks!!!  Shaylie Eklund 02/04/2011, 10:22 AM [id number]

## 2011-02-04 NOTE — Discharge Summary (Signed)
Admit date: 01/22/2011 Discharge date: 02/04/2011  Primary Care Physician:  [name], MD, MD   Discharge Diagnoses:   Active Hospital Problems  Diagnoses Date Noted   . Cholecystitis, acute, s/p biliary drain this adm 01/26/2011   . Hyponatremia 02/02/2011   . BPH (benign prostatic hyperplasia) 02/01/2011   . Dyslipidemia 01/22/2011   . CAD, CFX MI Rx'd medically 6/11 10/26/2009   . HYPERTENSION 06/07/2009     Resolved Hospital Problems  Diagnoses Date Noted Date Resolved  . Leukocytosis 02/02/2011 02/02/2011  . Hypokalemia 02/01/2011 02/02/2011  . NSVT (nonsustained ventricular tachycardia) 01/31/2011 02/02/2011  . PAF (paroxysmal atrial fibrillation), in setting of sepsis this adm Rx'd with Amio 01/31/2011 02/02/2011  . SBO (small bowel obstruction), post op 01/25/2011 02/02/2011     DISCHARGE MEDICATION: Current Discharge Medication List    START taking these medications   Details  ciprofloxacin (CIPRO) 500 MG tablet Take 1 tablet (500 mg total) by mouth 2 (two) times daily. Qty: 20 tablet, Refills: 0    metroNIDAZOLE (FLAGYL) 500 MG tablet Take 1 tablet (500 mg total) by mouth every 8 (eight) hours. Qty: 30 tablet, Refills: 0    oxyCODONE (OXY IR/ROXICODONE) 5 MG immediate release tablet Take 1 tablet (5 mg total) by mouth every 4 (four) hours as needed. Qty: 30 tablet, Refills: 0   Associated Diagnoses: Abdominal pain    Tamsulosin HCl (FLOMAX) 0.4 MG CAPS Take 1 capsule (0.4 mg total) by mouth daily after supper. Qty: 30 capsule, Refills: 0      CONTINUE these medications which have CHANGED   Details  omeprazole (PRILOSEC) 20 MG capsule Take 2 capsules (40 mg total) by mouth daily. Qty: 30 capsule, Refills: 1      CONTINUE these medications which have NOT CHANGED   Details  acetaminophen (TYLENOL) 500 MG tablet Take 500 mg by mouth every 6 (six) hours as needed.      citalopram (CELEXA) 20 MG tablet Take 20 mg by mouth daily.      DOXAZOSIN  MESYLATE PO Take 8 mg by mouth daily.      fexofenadine (ALLEGRA) 180 MG tablet Take 180 mg by mouth daily.      fluticasone (FLONASE) 50 MCG/ACT nasal spray 2 sprays by Nasal route daily.      gabapentin (NEURONTIN) 400 MG capsule Take 400 mg by mouth 3 (three) times daily.      HYDROcodone-acetaminophen (NORCO) 5-325 MG per tablet Take 1 tablet by mouth every 6 (six) hours as needed. For pain    lisinopril (PRINIVIL,ZESTRIL) 10 MG tablet Take 10 mg by mouth daily.      nebivolol (BYSTOLIC) 5 MG tablet Take 5 mg by mouth daily.      simvastatin (ZOCOR) 40 MG tablet Take 40 mg by mouth at bedtime.      testosterone (ANDROGEL) 50 MG/5GM GEL Place 5 g onto the skin daily.      traMADol-acetaminophen (ULTRACET) 37.5-325 MG per tablet Take 1 tablet by mouth every 6 (six) hours as needed.      nitroGLYCERIN (NITROSTAT) 0.4 MG SL tablet Place 0.4 mg under the tongue every 5 (five) minutes as needed.        STOP taking these medications     ondansetron (ZOFRAN ODT) 4 MG disintegrating tablet      aspirin 81 MG tablet            Consults: Treatment Team:  [facility]   SIGNIFICANT DIAGNOSTIC STUDIES:  Dg Chest 2 View  01/23/2011  *RADIOLOGY REPORT*  Clinical Data: Fever and upper abdominal pain  CHEST - 2 VIEW  Comparison: 06/27/2010  Findings: Shallow inspiration.  Infiltration or atelectasis in both lung bases, greater on the right.  This is new since the previous study may represent pneumonia.  Borderline heart size and pulmonary vascularity.  Calcified granulomas in the upper lungs.  The upper abdomen demonstrates bowel interposition in front of the liver and under the right hemidiaphragm.  Suggestion of mild gaseous distension of small bowel loops with air-fluid levels.  Small bowel obstruction should be considered.  Abdominal films likely would be useful in further evaluation.  IMPRESSION: Infiltration or atelectasis in both lung bases, greater on the right, suggesting pneumonia.   Right upper quadrant suggest possible small bowel dilatation and air-fluid levels.  Obstruction should be excluded.  Original Report Authenticated By: Neale Burly, M.D.   Dg Abd 1 View  01/31/2011  *RADIOLOGY REPORT*  Clinical Data: 77 year old male with possible ileus.  NG tube.  ABDOMEN - 1 VIEW  Comparison: 01/30/2011 and earlier.  Findings: Lower lumbar and sacral spine hardware re-identified and stable.  Mildly decreased bowel gas.  No dilated loops.  Bowel wall thickening in the left upper quadrant and right abdomen.  Enteric tube not identified on this image. Stable visualized osseous structures.  IMPRESSION: Bowel wall thickening in the right abdomen and left upper quadrant. No dilated bowel loops are evident.  Original Report Authenticated By: Randall An, M.D.   Ct Guided Abscess Drain  01/26/2011  *RADIOLOGY REPORT*  Indication: Acute cholecystitis, poor surgical candidate  CT GUIDED CHOLECYSTOMY TUBE PLACEMENT  Comparison: CT of the abdomen and pelvis - 01/25/2011  Medications: Conscious sedation was achieved intravenous Fentanyl and Versed.  The patient is admitted to the hospital and currently on intravenous antibiotics.  Total Moderate Sedation time: 40 minutes  Contrast: None  Complications: None immediate  Technique / Findings:  Informed written consent was obtained from the patient's white after a discussion of the risks, benefits and alternatives to treatment.  Review of the preprocedural CT demonstrates multiple loops of interposed small bowel nearly surrounding the gallbladder fossa.  As such, the decision was made to perform the procedure with CT guidance.  The patient was placed supine, slightly left lateral decubitus on the CT gantry and a pre procedural CT was performed demonstrating a moderate amount of perihepatic fluid within distended, thick-walled gallbladder.  The procedure was planned.   A timeout was performed prior to the initiation of the procedure.  The right  lateral upper abdomen prepped and draped in the usual sterile fashion.   The overlying soft tissues were anesthetized with 1% lidocaine with epinephrine. Appropriate transhepatic trajectory was planned with a 22 gauge spinal needle.  Ultimately, the gallbladder fossa was accessed with an Accustick needle with a combination of intermittent CT fluoroscopic guidance and ultimately intermittent CT images.  An 0.018 wire was coiled within the gallbladder fossa and appropriate positioning was confirmed with a limited CT.  The track was dilated with the Accustick set, ultimately allowing placement of a 10.2 [place] all-purpose drainage catheter into the gallbladder over a short Amplatz stiff wire.  A postprocedural scan was performed demonstrating appropriate positioning within the gallbladder and was negative for complication.  The catheter was connected to a drainage bag, secured in place with suture and a dressing was placed. Approximately 20 ml of bile was aspirated and sent to the lab.  The patient tolerated the procedure well without immediate postprocedural  complication.  Impression:  Successful CT guided placement of a 10.2 [place] all purpose drain into the gallbladder.  Samples were sent to the laboratory as requested by the ordering clinical team.  Original Report Authenticated By: Rachel Moulds, M.D.   Ct Abdomen Pelvis W Contrast  01/25/2011  *RADIOLOGY REPORT*  Clinical Data: Abdominal distention, history of prior total colectomy, evaluate for small bowel obstruction  CT ABDOMEN AND PELVIS WITH CONTRAST  Technique:  Multidetector CT imaging of the abdomen and pelvis was performed following the standard protocol during bolus administration of intravenous contrast.  Contrast: 53mL OMNIPAQUE IOHEXOL 300 MG/ML IV SOLN  Comparison: Abdomen films from 01/25/2011  Findings: There is atelectasis of the right lower lobe with probable small effusion.  This may represent pneumonia or central endobronchial lesion.   Coronary artery calcifications are noted as well and there is cardiomegaly present.  There is a small amount of perihepatic fluid present.  The liver has a normal appearance with no focal abnormality.  However the gallbladder is abnormal being distended and very thick-walled. Although no definite gallstones are seen, acute cholecystitis is the primary consideration.  The pancreas appears fatty replaced. The adrenal glands and spleen are unremarkable.  The stomach is filled with oral contrast and is unremarkable.  The kidneys enhance with no calculus or mass and no hydronephrosis is seen.  The abdominal aorta is normal in caliber with moderate atheromatous change present.  As noted on plain film there is some gaseous distention of small bowel in this patient with a history of colectomy.  The small bowel appears slightly prominent to the point of anastomosis in the mid abdomen with the remaining segment of rectosigmoid colon.  This small bowel distention may reflect ileus versus partial small bowel obstruction with stricture at the anastomotic site.  There is fluid within the remaining segment of colon.  Hardware for fusion of the lower lumbar spine is noted.  IMPRESSION:  1.  Dilated gallbladder with thickened gallbladder wall.  No definite gallstones.  These findings are highly suspicious for acute cholecystitis.  Correlate clinically. 2.  Right lower lobe atelectasis and possible pneumonia with small right effusion. 3.  Perihepatic fluid. 4.  Small bowel distention may reflect ileus or partial small bowel obstruction to the point of anastomosis with the remaining segment of colon in the pelvis.  Original Report Authenticated By: Joretta Bachelor, M.D.   Ct Abdomen Pelvis W Contrast  01/20/2011  *RADIOLOGY REPORT*  Clinical Data: Abdominal pain, nausea, vomiting, diarrhea.  History of colectomy.  CT ABDOMEN AND PELVIS WITH CONTRAST  Technique:  Multidetector CT imaging of the abdomen and pelvis was performed  following the standard protocol during bolus administration of intravenous contrast.  Contrast: 150mL OMNIPAQUE IOHEXOL 300 MG/ML IV SOLN  Comparison: 01/19/2010  Findings: Infiltration or atelectasis in the right lung base with dependent atelectasis in the left lung base.  Calcified granulomas in the spleen.  The liver, gallbladder, pancreas, adrenal glands, kidneys, and retroperitoneal lymph nodes are unremarkable. Vascular calcifications in the aorta without aneurysm.  Mild thickening of the wall of the distal esophagus possibly due to under distension or inflammatory esophagitis.  No free fluid or free air in the abdomen.  Prominent visceral adipose tissue.  Postoperative changes consistent with some total colectomy with ileal sigmoid anastomoses.  The  distal small bowel are mildly distended and filled with fluid and gas.  There is no wall thickening.  The anastomosis appears patent.  Changes appear to extend to  the rectum, suggesting an ileus rather than obstruction. No obstructing lesion is visualized.  Pelvis:  Calcifications in the prostate gland.  No bladder wall thickening.  No free or loculated pelvic fluid collections.  No pelvic lymphadenopathy.  Postoperative changes and degenerative changes in the lumbar spine.  IMPRESSION: Postoperative changes with some total colectomy and ileal sigmoid anastomoses.  Mild distension with fluid filled small bowel loops extending to the anus.  Although anal obstruction is not excluded, changes more likely represent ileus.  No significant wall thickening.  Original Report Authenticated By: Neale Burly, M.D.   Dg Chest Port 1 View  01/29/2011  *RADIOLOGY REPORT*  Clinical Data: History of weakness and hypertension.  History of removal of endotracheal tube.  PORTABLE CHEST - 1 VIEW  Comparison: 01/28/2011.  Findings: Tip of left internal jugular venous catheter terminates in the superior vena cava.  No pneumothorax is evident.  Tip of enteric tube terminates  in the proximal stomach area.  There is stable moderate enlargement of the cardiac silhouette.  There is elevation of the right hemidiaphragmatic margin with minimal right basilar atelectasis.  There is interval decrease in the amount of atelectasis in the right base since the prior examination.  There is improved aeration of the left lung.  There is interval decrease in the vascular congestion pattern.  No pleural effusion is evident.  IMPRESSION: Venous catheter and enteric tube are in place.  Stable moderate enlargement of the cardiac silhouette is seen.  Improved aeration in the right lung base with residual atelectasis.  Improved aeration in the left lung with decrease in vascular congestion pattern.  No pleural effusion or new lesion is evident.  Original Report Authenticated By: Delane Ginger, M.D.   Dg Chest Port 1 View  01/28/2011  *RADIOLOGY REPORT*  Clinical Data: Check endotracheal tube position.  PORTABLE CHEST - 1 VIEW  Comparison: 01/27/2011  Findings: Cardiomediastinal silhouette is stable.  There is worsening in aeration with mild congestion/edema.  Persistent small right pleural effusion with right basilar atelectasis or infiltrate.  Drainage catheter in the right upper quadrant of the abdomen is stable.  NG tube in place.  No endotracheal tube is identified.  Stable left IJ central line position  IMPRESSION:  There is worsening in aeration with mild congestion/edema. Persistent small right pleural effusion with right basilar atelectasis or infiltrate.  Drainage catheter in the right upper quadrant of the abdomen is stable.  NG tube in place.  No endotracheal tube is identified.  Original Report Authenticated By: Lahoma Crocker, M.D.   Dg Chest Port 1 View  01/27/2011  *RADIOLOGY REPORT*  Clinical Data: Productive cough, evaluate for pneumonia  PORTABLE CHEST - 1 VIEW  Comparison: 01/26/2011; 01/24/2011; 01/22/2011; 11/27/2009  Findings: Unchanged enlarged cardiac silhouette and mediastinal  contours.  Stable position of support apparatus.  There is persistent elevation of the right hemidiaphragm with blunting of the right costophrenic angles and right basilar heterogeneous opacities.  Minimal left basilar heterogeneous opacities.  No pneumothorax.  Cholecystostomy tube overlies the right upper abdominal quadrant.  Unchanged bones.  IMPRESSION: 1.  Persistent elevation of the right hemidiaphragm with small right-sided effusion and right basilar opacities, possibly atelectasis though underlying infection is not excluded. 2. Minimal left basilar heterogeneous opacities, possibly atelectasis  Original Report Authenticated By: Rachel Moulds, M.D.   Dg Chest Port 1 View  01/26/2011  *RADIOLOGY REPORT*  Clinical Data: Line placement.  PORTABLE CHEST - 1 VIEW  Comparison: 01/24/2011  Findings: Nasogastric tube is  in place with tip overlying the level of the stomach.  Left IJ central line tip overlies the level of the superior vena cava.  The film is very shallow lung inflation.  There is elevation of the right hemidiaphragm as before.  There is no evidence for pneumothorax.  Heart is enlarged.  There is perihilar bronchitic change.  Pulmonary vascular congestion is present.  There is bibasilar atelectasis.  IMPRESSION:  1.  Interval placement of nasogastric tube and left IJ central line. 2.  No evidence for postprocedure pneumothorax.  Original Report Authenticated By: Glenice Bow, M.D.   Dg Chest Port 1 View  01/24/2011  *RADIOLOGY REPORT*  Clinical Data: Shortness of breath and wheezing.  Chills.  PORTABLE CHEST - 1 VIEW  Comparison: 01/22/2011  Findings: Shallow inspiration with elevation of the right hemidiaphragm.  Infiltration or atelectasis again demonstrated in both lung bases, similar to previous study.  No blunting of costophrenic angles.  No pneumothorax.  Gas filled dilated bowel loops again demonstrated in the right upper quadrant.  Small bowel obstruction is not excluded.   IMPRESSION: Stable appearance of the chest since the previous study. Infiltration or atelectasis again demonstrated in the lung bases. Gaseous distension of small bowel loops in the right upper quadrant.  Original Report Authenticated By: Neale Burly, M.D.   Dg Abd Acute W/chest  01/30/2011  *RADIOLOGY REPORT*  Clinical Data: Leukocytosis and ileus. Previous colon resection.  ACUTE ABDOMEN SERIES (ABDOMEN 2 VIEW & CHEST 1 VIEW)  Comparison: CT 01/25/2011 and abdominal series 01/25/2011  Findings: Chest radiograph demonstrates a left jugular central venous catheter tip in the upper SVC region.  Elevation of the right hemidiaphragm.  Nasogastric tube in the stomach fundus region.  There is a cholecystostomy tube in the right upper quadrant of the abdomen.  Decubitus image demonstrates small bowel gas throughout the abdomen.  No evidence for free air. The degree of small bowel gaseous distention has decreased since 01/25/2011. Postsurgical changes in the lower lumbar spine.  Question a small nodule in the lateral left lung that roughly measures 5 mm.  This most likely represents overlying shadows.  IMPRESSION: Dilated loops of small bowel throughout the abdomen. The degree of distention has decreased since 01/25/2011.  Low lung volumes.  Question a nodular density in the left lung probably representing overlying shadows.  Recommend attention this area on followup imaging.  Original Report Authenticated By: Markus Daft, M.D.   Dg Abd Acute W/chest  01/25/2011  *RADIOLOGY REPORT*  Clinical Data: Abdominal distention.  Prior total colectomy.  ACUTE ABDOMEN SERIES (ABDOMEN 2 VIEW & CHEST 1 VIEW)  Comparison: 01/24/2011  Findings: The very low lung volumes are present bilaterally. Nasogastric tube tip projects over the stomach body.  There is likely atelectasis in both lower lobes.  The left side down lateral decubitus view of the abdomen demonstrates air fluid levels and scattered loops of dilated small bowel.   On supine imaging, the small bowel measures up to 6.2 cm in diameter, increased from previous.  No significant colonic stool or gas is observed.  IMPRESSION:  1.  Increasing distention of small bowel loops.  Although small bowel obstruction is favored based on the radiographic appearance, ileus is not totally excluded.  Original Report Authenticated By: Carron Curie, M.D.   Dg Abd Acute W/chest  01/24/2011  *RADIOLOGY REPORT*  Clinical Data: Abdominal pain and fever.  Follow up ileus versus small bowel obstruction.  ACUTE ABDOMEN SERIES (ABDOMEN 2 VIEW & CHEST 1  VIEW)  Comparison: 01/22/2011  Findings: Heart size appears moderately enlarged.  There is no pleural effusion or pulmonary edema.  Lung volumes are low.  Asymmetric elevation of the right hemidiaphragm noted.  No airspace consolidation.  Abnormal small bowel dilatation is again noted.  The small bowel loops measure up to 5 cm.  This is increased from 4 cm previously.  Prior posterior hardware fixation is identified within the lower lumbar spine.  IMPRESSION:  1.  Progression of small bowel dilatation.  Original Report Authenticated By: Angelita Ingles, M.D.   Dg Abd Acute W/chest  01/22/2011  *RADIOLOGY REPORT*  Clinical Data: Epigastric pain and distention.  Nausea vomiting and diarrhea.  Weakness.  Fever.  Chills.  ACUTE ABDOMEN SERIES (ABDOMEN 2 VIEW & CHEST 1 VIEW)  Comparison: 01/20/2011 CT abdomen.  Most recent plain film chest of 06/27/2010.  Findings: Frontal view of the chest demonstrates midline trachea. Mild cardiomegaly, accentuated by low lung volumes.  Moderate right hemidiaphragm elevation is not significantly changed. No pleural effusion or pneumothorax.  A left upper lobe calcified granuloma. Mild vascular crowding and infrahilar atelectasis on the right.  Abominal films demonstrate no free intraperitoneal air on upright positioning.  Air fluid levels within the mid small bowel loops. Mottled lucency about the right  abdomen is favored to be gas within nearly completely fluid filled bowel loops.  Small bowel dilatation at 4.0 cm in the right upper abdomen. Paucity of distal gas.  Lumbosacral spine fixation.  IMPRESSION: Findings suspicious for small bowel obstruction. Consider CT.  Original Report Authenticated By: Areta Haber, M.D.    Recent Results (from the past 240 hour(s))  MRSA PCR SCREENING     Status: Normal   Collection Time   01/26/11  5:35 AM      Component Value Range Status Comment   MRSA by PCR NEGATIVE  NEGATIVE  Final   CULTURE, BLOOD (ROUTINE X 2)     Status: Normal   Collection Time   01/26/11  9:00 AM      Component Value Range Status Comment   Specimen Description BLOOD   Final    Special Requests     Final    Value: BOTTLES DRAWN AEROBIC AND ANAEROBIC 5CC FROM TRIPLE LUMEN CATH LT IJ   Setup Time [id number]   Final    Culture NO GROWTH 5 DAYS   Final    Report Status 02/01/2011 FINAL   Final   CULTURE, BLOOD (ROUTINE X 2)     Status: Normal   Collection Time   01/26/11 10:00 AM      Component Value Range Status Comment   Specimen Description BLOOD LEFT ARM   Final    Special Requests BOTTLES DRAWN AEROBIC AND ANAEROBIC 5CC   Final    Setup Time [id number]   Final    Culture NO GROWTH 5 DAYS   Final    Report Status 02/01/2011 FINAL   Final   CULTURE, ROUTINE-ABSCESS     Status: Normal   Collection Time   01/26/11  3:00 PM      Component Value Range Status Comment   Specimen Description ABSCESS GALL BLADDER   Final    Special Requests PATIENT ON FOLLOWING VANCOMYCIN ZOSYN   Final    Gram Stain     Final    Value: NO WBC SEEN     NO SQUAMOUS EPITHELIAL CELLS SEEN     FEW GRAM NEGATIVE RODS   Culture FEW CLOSTRIDIUM PERFRINGENS  Final    Report Status 01/31/2011 FINAL   Final   CULTURE, ROUTINE-ABSCESS     Status: Normal   Collection Time   01/26/11  8:37 PM      Component Value Range Status Comment   Specimen Description ABSCESS GALL BLADDER FLUID   Final     Special Requests NONE   Final    Gram Stain     Final    Value: ABUNDANT WBC PRESENT, PREDOMINANTLY PMN     NO SQUAMOUS EPITHELIAL CELLS SEEN     ABUNDANT GRAM VARIABLE ROD   Culture ABUNDANT KLEBSIELLA SPECIES   Final    Report Status 01/30/2011 FINAL   Final    Organism ID, Bacteria KLEBSIELLA SPECIES   Final   CLOSTRIDIUM DIFFICILE BY PCR     Status: Normal   Collection Time   01/31/11  5:36 AM      Component Value Range Status Comment   C difficile by pcr NEGATIVE  NEGATIVE  Final     BRIEF ADMITTING H & P: Patient is a 77 year old white male with a past medical history of coronary artery disease status post PTCA, hypertension, dyslipidemia, prior history of a diverticular bleed requiring a colectomy comes in with abdominal pain since last Friday. The patient is mostly in the upper abdomen. There is no radiation of the pain. Pain has been associated with nausea but no vomiting. Patient has had numerous episodes of diarrhea as well. Patient presented to the emergency room a few days ago and was rehydrated, a CT scan of his abdomen was done and was sent home after supportive care. After being discharged from the emergency room patient continued to have worsening abdominal pain and diarrhea, hence she presented to [facility] for further evaluation. A abdominal x-ray done today shows suspicion of a small bowel obstruction. Surgery has evaluated the patient, given the patient's multiple medical issues the hospitalist service has not been asked to admit this patient for further evaluation and treatment. Dressing the patient has had no further episodes of diarrhea or nausea since 4 AM this morning. During my evaluation, his abdominal pain had significantly improved. He denied any chest pain or shortness of breath. He denied any blood in the stools.   Active Hospital Problems  Diagnoses Date Noted   . Cholecystitis, acute, s/p biliary drain this adm: He was admitted initially thinking it  was gastroenteritis. A CT scan of the abdomen was done that showed gallbladder thickening. Due to to his cardiac instability he was transferred to the step down unit. Surgery was consulted they recommended to put a drain between his multiple comorbidities, cultures were done and this will cut CL and posterior perfringens. His antibiotics were changed from Rocephin and Flagyl to Cipro and Flagyl as Klebsiella is sensitive too. He tolerated this well. His diet was advanced. Nursing will follow as an outpatient for drain management. He will follow up with Dr. Dalbert Batman as an outpatient . 01/26/2011   . Hyponatremia: This probably secondary to his ongoing diarrhea. As he has a total colectomy. This has remained stable.  02/02/2011   . BPH (benign prostatic hyperplasia), Due to borderline low pressure this oxygen was stopped. He was started and Flomax he tolerated this well.  02/01/2011   . Dyslipidemia No changes were made  01/22/2011   . CAD, CFX MI Rx'd medically 6/11 . He would continue aspirin. No changes were made.  10/26/2009   . HYPERTENSION: Very well controlled this resolved.  06/07/2009  Resolved Hospital Problems  Diagnoses Date Noted Date Resolved  . Leukocytosis: This probably secondary to acute cholecystitis and resolved.  02/02/2011 02/02/2011  . NSVT (nonsustained ventricular tachycardia): Cardiology was consulted, he was started on IV, this got controlled and he was changed to nevibolol. And as tolerated this he will discharge and as her for home.  01/31/2011 02/02/2011  . SBO (small bowel obstruction), post op: He was given bowel rest this resolved the  01/25/2011 02/02/2011     Disposition and Follow-up:   Discharge Orders    Future Orders Please Complete By Expires   Diet - low sodium heart healthy      Increase activity slowly        Follow-up Information    Follow up with Adin Hector, MD. Make an appointment in 4 weeks.   Contact information:   [employer], [place], [place] [phone]       Follow up with GREEN, Keenan Bachelor, MD in 1 week. (As needed)    Contact information:   [address], [place] [phone]           DISCHARGE EXAM:  General: Alert, awake, oriented x3, in no acute distress.  HEENT: No bruits, no goiter.  Heart: Regular rate and rhythm, without murmurs, rubs, gallops.  Lungs: Good air movement and clear to auscultation.  Abdomen: Positive bowel sounds soft mild right upper quadrant tenderness no rebound or guarding drain in place no erythema or drainage.  Neuro: Grossly intact, nonfocal.   Blood pressure 118/59, pulse 67, temperature 97.8 F (36.6 C), temperature source Oral, resp. rate 18, height 5\' 8"  (1.727 m), weight 82.101 kg (181 lb), SpO2 96.00%.   Basename 02/03/11 0646 02/02/11 1213 02/02/11 0600  NA 133* -- 133*  K 4.5 -- 3.8  CL 103 -- 103  CO2 19 -- 20  GLUCOSE 129* -- 167*  BUN 24* -- 25*  CREATININE 1.42* -- 1.16  CALCIUM 8.9 -- 8.2*  MG -- 2.1 --  PHOS -- -- --    Basename 02/02/11 0600  AST 24  ALT 22  ALKPHOS 69  BILITOT 0.5  PROT 5.6*  ALBUMIN 2.2*     Basename 02/02/11 0600  WBC 16.3*  NEUTROABS --  HGB 14.8  HCT 43.5  MCV 87.2  PLT 261    Signed: Charlynne Cousins M.D. 02/04/2011, 9:00 AM

## 2011-02-13 ENCOUNTER — Encounter (INDEPENDENT_AMBULATORY_CARE_PROVIDER_SITE_OTHER): Payer: Medicare Other | Admitting: General Surgery

## 2011-02-20 ENCOUNTER — Telehealth (INDEPENDENT_AMBULATORY_CARE_PROVIDER_SITE_OTHER): Payer: Self-pay

## 2011-02-20 NOTE — Telephone Encounter (Signed)
[name] at [facility] called to let MD know they will send continuation order for weekly drain care. Pt has upcoming appt with Dr Dalbert Batman.

## 2011-03-04 ENCOUNTER — Ambulatory Visit (INDEPENDENT_AMBULATORY_CARE_PROVIDER_SITE_OTHER): Payer: Medicare Other | Admitting: General Surgery

## 2011-03-04 ENCOUNTER — Encounter (INDEPENDENT_AMBULATORY_CARE_PROVIDER_SITE_OTHER): Payer: Self-pay | Admitting: General Surgery

## 2011-03-04 ENCOUNTER — Encounter (INDEPENDENT_AMBULATORY_CARE_PROVIDER_SITE_OTHER): Payer: Self-pay

## 2011-03-04 VITALS — BP 124/56 | HR 58 | Temp 98.8°F | Ht 68.5 in | Wt 188.4 lb

## 2011-03-04 DIAGNOSIS — K819 Cholecystitis, unspecified: Secondary | ICD-10-CM

## 2011-03-04 DIAGNOSIS — I2510 Atherosclerotic heart disease of native coronary artery without angina pectoris: Secondary | ICD-10-CM

## 2011-03-04 DIAGNOSIS — I252 Old myocardial infarction: Secondary | ICD-10-CM

## 2011-03-04 NOTE — Patient Instructions (Signed)
You had a severe case of acute cholecystitis. Fortunately you are doing better now. We are going to leave the gallbladder drain in for the next week or 2.  We will schedule you for an x-ray study of the drain next week.  We are going to draw blood work today  I advise you to see Dr. Aldona Bar in the next week to 10 days for a preop cardiac clearance.  You will return to see me sometime in early February. Hopefully we will find that we can go ahead with a cholecystectomy sometime in mid to late February.

## 2011-03-04 NOTE — Progress Notes (Signed)
Patient ID: Gary [name], male   DOB: 07/22/1932, 78 y.o.   MRN: [id number]  Chief Complaint  Patient presents with  . Pre-op Exam    eval gallbladder    HPI Gary [name] is a 78 y.o. male. Gary [name] was hospitalized on January 22, 2011 through February 04, 2011. He had nausea, vomiting, diarrhea and abdominal pain. The diagnosis was not immediately clear but ultimately a CT scan showed severe acute cholecystitis. Because of cardiovascular instability a decision was made to proceed with percutaneous cholecystostomy. He did recover. He was seen by Dr. Georgette Dover., Dr. Rise Patience, Dr. Marlou Starks, and Dr. Ninfa Linden during this hospitalization.  He has a significant past surgical history of a subtotal colectomy by me on January 24, 2010 because of persistent lower GI bleeding from diverticulosis. He has recovered from that surgery and he keeps his diarrhea under control by watching what he eats.  Since his discharge from the hospital recently he is feeling better. He has no more nausea or vomiting. He has no more pain. No more fever. He has completed his course of antibiotics. The only anticoagulant he takes is aspirin.  His primary care physician is Dr. Conni Slipper and  his outpatient cardiologist is Dr. Aldona Bar.  He still has a drain going in through his right posterior flank. It is draining thin greenish watery fluid.  He says it drains drain between 25 and 50 cc per day.HPI  Past Medical History  Diagnosis Date  . Previous back surgery 1997  . Hypertension   . Hemorrhoids   . Chronic kidney disease   . Kidney stones   . Dyslipidemia   . Diverticulosis   . Colon polyps   . Angina   . Heart attack 07/2009  . Blood transfusion   . Anemia   . GI bleeding after 07/2009    "triggered by Plavix & ASA; from my diverticulosis"  . GERD (gastroesophageal reflux disease)   . Headache     occasionally  . Anxiety   . Arthritis     "hips and lower back"  . Chronic back pain greater than 3 months  duration   . Heart attack   . Diarrhea   . Arthritis pain     Past Surgical History  Procedure Date  . Eye surgery 1942  . Back surgery 1997  . Colon surgery     COLONOSCOPY  . Subtotal colectomy 01/24/2010    Family History  Problem Relation Age of Onset  . Heart disease Father   . Colon cancer Neg Hx   . Pancreatic cancer Mother   . Cancer Mother     pancreatic    Social History History  Substance Use Topics  . Smoking status: Never Smoker   . Smokeless tobacco: Never Used  . Alcohol Use: No    Allergies  Allergen Reactions  . Clopidogrel Bisulfate Other (See Comments)    H/O diverticulitis; prone to rectal bleeding.  Plavix complicated this.  jkl  . Sulfonamide Derivatives Hives  . Naproxen Rash    Current Outpatient Prescriptions  Medication Sig Dispense Refill  . acetaminophen (TYLENOL) 500 MG tablet Take 500 mg by mouth every 6 (six) hours as needed.        Marland Kitchen aspirin (ASPIRIN EC) 81 MG EC tablet Take 1 tablet (81 mg total) by mouth daily. Swallow whole.  30 tablet  12  . citalopram (CELEXA) 20 MG tablet Take 20 mg by mouth daily.        Marland Kitchen  DOXAZOSIN MESYLATE PO Take 8 mg by mouth daily.        . fexofenadine (ALLEGRA) 180 MG tablet Take 180 mg by mouth daily.        . fluticasone (FLONASE) 50 MCG/ACT nasal spray 2 sprays by Nasal route daily.        Marland Kitchen gabapentin (NEURONTIN) 400 MG capsule Take 400 mg by mouth 3 (three) times daily.        Marland Kitchen lisinopril (PRINIVIL,ZESTRIL) 10 MG tablet Take 10 mg by mouth daily.        . nebivolol (BYSTOLIC) 5 MG tablet Take 5 mg by mouth daily.        . nitroGLYCERIN (NITROSTAT) 0.4 MG SL tablet Place 0.4 mg under the tongue every 5 (five) minutes as needed.        Marland Kitchen omeprazole (PRILOSEC) 20 MG capsule Take 2 capsules (40 mg total) by mouth daily.  30 capsule  1  . simvastatin (ZOCOR) 40 MG tablet Take 40 mg by mouth at bedtime.        . Tamsulosin HCl (FLOMAX) 0.4 MG CAPS Take 1 capsule (0.4 mg total) by mouth daily after  supper.  30 capsule  0  . testosterone (ANDROGEL) 50 MG/5GM GEL Place 5 g onto the skin daily.        . traMADol-acetaminophen (ULTRACET) 37.5-325 MG per tablet Take 1 tablet by mouth every 6 (six) hours as needed.          Review of Systems Review of Systems  Constitutional: Negative for fever, chills and unexpected weight change.  HENT: Negative for hearing loss, congestion, sore throat, trouble swallowing and voice change.   Eyes: Negative for visual disturbance.  Respiratory: Negative for cough and wheezing.   Cardiovascular: Negative for chest pain, palpitations and leg swelling.  Gastrointestinal: Positive for diarrhea. Negative for nausea, vomiting, abdominal pain, constipation, blood in stool, abdominal distention, anal bleeding and rectal pain.  Genitourinary: Negative for hematuria and difficulty urinating.  Musculoskeletal: Negative for arthralgias.  Skin: Negative for rash and wound.  Neurological: Negative for seizures, syncope, weakness and headaches.  Hematological: Negative for adenopathy. Does not bruise/bleed easily.  Psychiatric/Behavioral: Negative for confusion.    Blood pressure 124/56, pulse 58, temperature 98.8 F (37.1 C), temperature source Temporal, height 5' 8.5" (1.74 m), weight 188 lb 6.4 oz (85.458 kg), SpO2 97.00%.  Physical Exam Physical Exam  Constitutional: He is oriented to person, place, and time. He appears well-developed and well-nourished. No distress.  HENT:  Head: Normocephalic.  Nose: Nose normal.  Mouth/Throat: No oropharyngeal exudate.  Eyes: Conjunctivae and EOM are normal. Pupils are equal, round, and reactive to light. Right eye exhibits no discharge. Left eye exhibits no discharge. No scleral icterus.  Neck: Normal range of motion. Neck supple. No JVD present. No tracheal deviation present. No thyromegaly present.  Cardiovascular: Normal rate, regular rhythm, normal heart sounds and intact distal pulses.   No murmur  heard. Pulmonary/Chest: Effort normal and breath sounds normal. No stridor. No respiratory distress. He has no wheezes. He has no rales. He exhibits no tenderness.  Abdominal: Soft. Bowel sounds are normal. He exhibits no distension and no mass. There is no tenderness. There is no rebound and no guarding.    Musculoskeletal: Normal range of motion. He exhibits no edema and no tenderness.  Lymphadenopathy:    He has no cervical adenopathy.  Neurological: He is alert and oriented to person, place, and time. He has normal reflexes. Coordination normal.  Skin: Skin  is warm and dry. No rash noted. He is not diaphoretic. No erythema. No pallor.  Psychiatric: He has a normal mood and affect. His behavior is normal. Judgment and thought content normal.    Data Reviewed I reviewed all of his recent hospital records. I revealed all of my old office notes.  Assessment    Severe acute cholecystitis, recently hospitalized and managed with percutaneous cholecystostomy tube.  He has recovered from this and is questioning how long he will have to have the drain and is questioning when he will need to have a cholecystectomy.  Status post subtotal colectomy December, 2011 for lower GI bleed from diverticulosis  Coronary disease, status post MI in June 2011, treated medically  Hypertension  Benign prostatic hypertrophy  History of neuralgia requiring narcotic and gabapentin, postherpetic  Status post lumbar fusion    Plan    We will leave the drain in for now, since that it is less than a month since he was hospitalized.  We will draw CBC and complete metabolic panel today.  I'll refer him back to Dr.Al. Little for cardiac clearance for cholecystectomy, which may have to be done open  We will schedule him for a study of the cholecystostomy drain to see if the gallbladder and cystic duct are patent.  Return to see me in about 3 weeks to see how he is doing. Hopefully we will find that he is  stable enough to undergo cholecystectomy in mid to late bed where it.  We might be able to do this laparoscopically, gout, but certainly he is at high risk for conversion to open procedure.   Edsel Petrin. Dalbert Batman, M.D., [facility] General and Minimally invasive Surgery Breast and Colorectal Surgery Office:   [phone] Pager:   [phone]      03/04/2011, 3:23 PM

## 2011-03-05 LAB — COMPREHENSIVE METABOLIC PANEL
AST: 18 U/L (ref 0–37)
Albumin: 4.2 g/dL (ref 3.5–5.2)
BUN: 21 mg/dL (ref 6–23)
CO2: 28 mEq/L (ref 19–32)
Calcium: 8.9 mg/dL (ref 8.4–10.5)
Chloride: 103 mEq/L (ref 96–112)
Creat: 1.63 mg/dL — ABNORMAL HIGH (ref 0.50–1.35)
Glucose, Bld: 98 mg/dL (ref 70–99)
Potassium: 5.1 mEq/L (ref 3.5–5.3)

## 2011-03-05 LAB — CBC WITH DIFFERENTIAL/PLATELET
Basophils Absolute: 0 10*3/uL (ref 0.0–0.1)
Eosinophils Relative: 2 % (ref 0–5)
HCT: 37.5 % — ABNORMAL LOW (ref 39.0–52.0)
Hemoglobin: 12 g/dL — ABNORMAL LOW (ref 13.0–17.0)
Lymphocytes Relative: 14 % (ref 12–46)
MCV: 91 fL (ref 78.0–100.0)
Monocytes Absolute: 0.5 10*3/uL (ref 0.1–1.0)
Monocytes Relative: 8 % (ref 3–12)
Neutro Abs: 5 10*3/uL (ref 1.7–7.7)
RDW: 15.4 % (ref 11.5–15.5)
WBC: 6.7 10*3/uL (ref 4.0–10.5)

## 2011-03-07 ENCOUNTER — Ambulatory Visit (HOSPITAL_COMMUNITY)
Admission: RE | Admit: 2011-03-07 | Discharge: 2011-03-07 | Disposition: A | Discharge status: Home / Self Care | Payer: Medicare Other | Source: Ambulatory Visit | Attending: General Surgery | Admitting: General Surgery

## 2011-03-07 ENCOUNTER — Telehealth (INDEPENDENT_AMBULATORY_CARE_PROVIDER_SITE_OTHER): Payer: Self-pay

## 2011-03-07 DIAGNOSIS — Z434 Encounter for attention to other artificial openings of digestive tract: Secondary | ICD-10-CM | POA: Insufficient documentation

## 2011-03-07 DIAGNOSIS — K820 Obstruction of gallbladder: Secondary | ICD-10-CM | POA: Insufficient documentation

## 2011-03-07 DIAGNOSIS — K819 Cholecystitis, unspecified: Secondary | ICD-10-CM

## 2011-03-07 MED ORDER — IOHEXOL 300 MG/ML  SOLN: 10.0000 mL | Freq: Once | INTRAMUSCULAR | Status: AC | PRN

## 2011-03-07 NOTE — Procedures (Signed)
Tube is well positioned in gallbladder.  No drainage out of the gallbladder.  The cystic duct is obstructed.

## 2011-03-07 NOTE — Telephone Encounter (Signed)
Per Dr Darrel Hoover request pt advised that drain will need to stay in place until surgery. Pt states he understands.

## 2011-03-16 ENCOUNTER — Telehealth (INDEPENDENT_AMBULATORY_CARE_PROVIDER_SITE_OTHER): Payer: Self-pay | Admitting: General Surgery

## 2011-03-16 NOTE — Telephone Encounter (Signed)
Called about small amount of drainage around cholecystostomy tube.  Feels okay, and no abdominal pain, no fevers.  I recommended coming in to ER for evaluation if he is concerned.  Although from his description, it sounds as things are okay.

## 2011-03-18 ENCOUNTER — Ambulatory Visit (INDEPENDENT_AMBULATORY_CARE_PROVIDER_SITE_OTHER): Payer: Medicare Other | Admitting: General Surgery

## 2011-03-18 VITALS — BP 112/70 | HR 59 | Temp 98.6°F | Resp 16 | Ht 68.5 in | Wt 190.8 lb

## 2011-03-18 DIAGNOSIS — Z8719 Personal history of other diseases of the digestive system: Secondary | ICD-10-CM

## 2011-03-18 NOTE — Progress Notes (Signed)
Subjective:     Patient ID: Gary [name], male   DOB: 1932/05/23, 78 y.o.   MRN: [id number]  HPI This patient presents today for evaluation of his percutaneous cholecystostomy tube which was placed a few weeks ago while admitted for severe episode of cholecystitis. He has cardiac issues in and is awaiting cardiac clearance later today from his cardiologist. He was having decreased drainage from the tube and was concerned about some tenderness from the tube insertion site. He denies abdominal pain or fevers or chills and has been tolerating diet.  Review of Systems     Objective:   Physical Exam No acute distress and nontoxic-appearing  His abdomen is soft nontender on exam. He is drain site is erythematous and irritated but the sutures are placed in the tube does not appear to have withdrawn. There is no evidence of cellulitis or infection I think that the local erythema is reaction to the tube. He has a slight amount of thin green fluid in the drain.   Assessment:     History of acute cholecystitis I think his cholecystostomy tube looks appropriate. He does not have any abdominal discomfort or fevers. I think that if this tube was having a problem I think he would be more symptomatic. I did not really feel the need to do another to check today because he's clinically doing well. He is scheduled to see his cardiologist today and he is scheduled to followup with Dr. Dalbert Batman on Thursday to discuss definitive cholecystectomy.I think that the tenderness at the insertion site is local skin reaction from the sutures and the tube again I do not see any palpitation.    Plan:     Followup Thursday with Dr. Dalbert Batman at his regular scheduled appointment

## 2011-03-19 ENCOUNTER — Encounter (INDEPENDENT_AMBULATORY_CARE_PROVIDER_SITE_OTHER): Payer: Medicare Other | Admitting: General Surgery

## 2011-03-21 ENCOUNTER — Encounter (INDEPENDENT_AMBULATORY_CARE_PROVIDER_SITE_OTHER): Payer: Self-pay | Admitting: General Surgery

## 2011-03-21 ENCOUNTER — Ambulatory Visit (INDEPENDENT_AMBULATORY_CARE_PROVIDER_SITE_OTHER): Payer: Medicare Other | Admitting: General Surgery

## 2011-03-21 ENCOUNTER — Telehealth (INDEPENDENT_AMBULATORY_CARE_PROVIDER_SITE_OTHER): Payer: Self-pay

## 2011-03-21 VITALS — BP 112/66 | HR 62 | Temp 97.8°F | Resp 18 | Ht 68.5 in | Wt 191.0 lb

## 2011-03-21 DIAGNOSIS — K8010 Calculus of gallbladder with chronic cholecystitis without obstruction: Secondary | ICD-10-CM

## 2011-03-21 NOTE — Progress Notes (Signed)
Patient ID: [name], male   DOB: [birth year], 78 y.o.   MRN: [id number]  Chief Complaint  Patient presents with  . New Evaluation    Cholecystitis    HPI Gary [name] is a 78 y.o. male.  HPI  Gary [name] is a 78 y.o. male. Gary [name] was hospitalized on January 22, 2011 through February 04, 2011. He had nausea, vomiting, diarrhea and abdominal pain. The diagnosis was not immediately clear but ultimately a CT scan showed severe acute cholecystitis. Because of cardiovascular instability a decision was made to proceed with percutaneous cholecystostomy. He did recover. He was seen by Dr. Georgette Dover., Dr. Rise Patience, Dr. Marlou Starks, and Dr. Ninfa Linden during this hospitalization.   He has a significant past surgical history of a subtotal colectomy by me on January 24, 2010 because of persistent lower GI bleeding from diverticulosis. He has recovered from that surgery and he keeps his diarrhea under control by watching what he eats.   Since his discharge from the hospital recently he is feeling better. He has no more nausea or vomiting. He has no more pain. No more fever. He has completed his course of antibiotics. The only anticoagulant he takes is aspirin.   His primary care physician is Dr. Conni Slipper.  He has seen Dr. Aldona Bar recently, and has been cleared for surgery to remove his gallbladder with moderate cardiovascular risk.  He still has a drain going in through his right posterior flank. The drain was recently studied by interventional radiology. The drain is well positioned but the cystic duct is obstructed. The drainage has gone down to almost nothing now but he has no other symptoms and there is no drainage around the drain to the skin. He continues to eat reasonably well. He states he has gained about 6 pounds since his hospitalization, although he lost 20 pounds as a result of his illness in December.     He is ready to proceed with cholecystectomy.  HPI  Past Medical History    Diagnosis Date  . Previous back surgery 1997  . Hypertension   . Hemorrhoids   . Chronic kidney disease   . Kidney stones   . Dyslipidemia   . Diverticulosis   . Colon polyps   . Angina   . Heart attack 07/2009  . Blood transfusion   . Anemia   . GI bleeding after 07/2009    "triggered by Plavix & ASA; from my diverticulosis"  . GERD (gastroesophageal reflux disease)   . Headache     occasionally  . Anxiety   . Arthritis     "hips and lower back"  . Chronic back pain greater than 3 months duration   . Heart attack   . Diarrhea   . Arthritis pain   . Cholecystitis     Past Surgical History  Procedure Date  . Eye surgery 1942  . Back surgery 1997  . Colon surgery     COLONOSCOPY  . Subtotal colectomy 01/24/2010    Family History  Problem Relation Age of Onset  . Heart disease Father   . Colon cancer Neg Hx   . Pancreatic cancer Mother   . Cancer Mother     pancreatic    Social History History  Substance Use Topics  . Smoking status: Never Smoker   . Smokeless tobacco: Never Used  . Alcohol Use: No    Allergies  Allergen Reactions  . Clopidogrel Bisulfate Other (See Comments)    H/O  diverticulitis; prone to rectal bleeding.  Plavix complicated this.  jkl  . Sulfonamide Derivatives Hives  . Naproxen Rash    Current Outpatient Prescriptions  Medication Sig Dispense Refill  . furosemide (LASIX) 20 MG tablet Take 20 mg by mouth daily.      Marland Kitchen acetaminophen (TYLENOL) 500 MG tablet Take 500 mg by mouth every 6 (six) hours as needed.        Marland Kitchen aspirin (ASPIRIN EC) 81 MG EC tablet Take 1 tablet (81 mg total) by mouth daily. Swallow whole.  30 tablet  12  . citalopram (CELEXA) 20 MG tablet Take 20 mg by mouth daily.        Marland Kitchen DOXAZOSIN MESYLATE PO Take 8 mg by mouth daily.        . fexofenadine (ALLEGRA) 180 MG tablet Take 180 mg by mouth daily.        . fluticasone (FLONASE) 50 MCG/ACT nasal spray 2 sprays by Nasal route daily.        Marland Kitchen gabapentin (NEURONTIN)  400 MG capsule Take 400 mg by mouth 3 (three) times daily.        Marland Kitchen lisinopril (PRINIVIL,ZESTRIL) 10 MG tablet Take 10 mg by mouth daily.        . nebivolol (BYSTOLIC) 5 MG tablet Take 5 mg by mouth daily.        . nitroGLYCERIN (NITROSTAT) 0.4 MG SL tablet Place 0.4 mg under the tongue every 5 (five) minutes as needed.        Marland Kitchen omeprazole (PRILOSEC) 20 MG capsule Take 2 capsules (40 mg total) by mouth daily.  30 capsule  1  . simvastatin (ZOCOR) 40 MG tablet Take 40 mg by mouth at bedtime.        . Tamsulosin HCl (FLOMAX) 0.4 MG CAPS Take 1 capsule (0.4 mg total) by mouth daily after supper.  30 capsule  0  . testosterone (ANDROGEL) 50 MG/5GM GEL Place 5 g onto the skin daily.        . traMADol-acetaminophen (ULTRACET) 37.5-325 MG per tablet Take 1 tablet by mouth every 6 (six) hours as needed.          Review of Systems Review of Systems  Constitutional: Negative for fever, chills and unexpected weight change.  HENT: Negative for hearing loss, congestion, sore throat, trouble swallowing and voice change.   Eyes: Negative for visual disturbance.  Respiratory: Negative for cough and wheezing.   Cardiovascular: Negative for chest pain, palpitations and leg swelling.  Gastrointestinal: Negative for nausea, vomiting, abdominal pain, diarrhea, constipation, blood in stool, abdominal distention, anal bleeding and rectal pain.  Genitourinary: Negative for hematuria and difficulty urinating.  Musculoskeletal: Negative for arthralgias.  Skin: Negative for rash and wound.  Neurological: Negative for seizures, syncope, weakness and headaches.  Hematological: Negative for adenopathy. Does not bruise/bleed easily.  Psychiatric/Behavioral: Negative for confusion.    Blood pressure 112/66, pulse 62, temperature 97.8 F (36.6 C), temperature source Temporal, resp. rate 18, height 5' 8.5" (1.74 m), weight 191 lb (86.637 kg).  Physical Exam Physical Exam  Constitutional: He is oriented to person,  place, and time. He appears well-developed and well-nourished. No distress.  HENT:  Head: Normocephalic.  Nose: Nose normal.  Mouth/Throat: No oropharyngeal exudate.  Eyes: Conjunctivae and EOM are normal. Pupils are equal, round, and reactive to light. Right eye exhibits no discharge. Left eye exhibits no discharge. No scleral icterus.  Neck: Normal range of motion. Neck supple. No JVD present. No tracheal deviation present. No  thyromegaly present.  Cardiovascular: Normal rate, regular rhythm, normal heart sounds and intact distal pulses.   No murmur heard. Pulmonary/Chest: Effort normal and breath sounds normal. No stridor. No respiratory distress. He has no wheezes. He has no rales. He exhibits no tenderness.  Abdominal: Soft. Bowel sounds are normal. He exhibits no distension and no mass. There is no tenderness. There is no rebound and no guarding.       Abdomen is soft and nontender. Well-healed midline scar starting several centimeters below the xiphoid and going down to the suprapubic area. Drain site right flank is clean and dry and there is minimal drainage in the bag.  Musculoskeletal: Normal range of motion. He exhibits no edema and no tenderness.  Lymphadenopathy:    He has no cervical adenopathy.  Neurological: He is alert and oriented to person, place, and time. He has normal reflexes. Coordination normal.  Skin: Skin is warm and dry. No rash noted. He is not diaphoretic. No erythema. No pallor.  Psychiatric: He has a normal mood and affect. His behavior is normal. Judgment and thought content normal.    Data Reviewed I have reviewed his cholecystostomy tube x-ray study. I reviewed Dr. Dorris Fetch cardiac clearance. I reviewed the records from his hospitalization 2 months ago.  Assessment    Acute and chronic cholecystitis, status post percutaneous cholecystostomy tube 01/26/2011.  History subtotal colectomy for ongoing lower GI bleeding from diverticulitis, 01/24/2010.    no  apparent postop complications. Nutritional status good.  Coronary disease, status post myocardial infarction, status post cardiac catheter, stable without symptoms  Chronic kidney disease  Hypertension.   Plan    Scheduled for laparoscopic cholecystectomy with cholangiogram, possible open.  Discontinue aspirin 3 days preop  I told him that there was increased with that we would convert him to an open operation because of adhesions. I told him that we would remove his right flank drain at the time of surgery but he may or may not require a subhepatic drain at the time of surgery.  I discussed the risks and indications and details of surgery with him. Complications have been outlined, including but not limited to bleeding, infection, conversion to open laparotomy, bile leak, injury to adjacent organs such as the main bile duct or intestine with major  reconstructive surgery, wound healing problems, cardiac pulmonary and thromboembolic problems. He understands his issues. His questions were answered. He agrees with the plan.       Edsel Petrin. Dalbert Batman, M.D., [facility] General and Minimally invasive Surgery Breast and Colorectal Surgery Office:   [phone] Pager:   [phone]  03/21/2011, 11:30 AM

## 2011-03-21 NOTE — Telephone Encounter (Signed)
Clearance note here and to MR to scan.

## 2011-03-21 NOTE — Patient Instructions (Signed)
You  will be  scheduled for surgery. We will schedule you for a laparoscopic cholecystectomy with cholangiogram. There is an increased risk that we may have to convert this to an open operation because of your previous surgery and adhesions.  Laparoscopic Cholecystectomy Laparoscopic cholecystectomy is surgery to remove the gallbladder. The gallbladder is located slightly to the right of center in the abdomen, behind the liver. It is a concentrating and storage sac for the bile produced in the liver. Bile aids in the digestion and absorption of fats. Gallbladder disease (cholecystitis) is an inflammation of your gallbladder. This condition is usually caused by a buildup of gallstones (cholelithiasis) in your gallbladder. Gallstones can block the flow of bile, resulting in inflammation and pain. In severe cases, emergency surgery may be required. When emergency surgery is not required, you will have time to prepare for the procedure. Laparoscopic surgery is an alternative to open surgery. Laparoscopic surgery usually has a shorter recovery time. Your common bile duct may also need to be examined and explored. Your caregiver will discuss this with you if he or she feels this should be done. If stones are found in the common bile duct, they may be removed. LET YOUR CAREGIVER KNOW ABOUT:  Allergies to food or medicine.   Medicines taken, including vitamins, herbs, eyedrops, over-the-counter medicines, and creams.   Use of steroids (by mouth or creams).   Previous problems with anesthetics or numbing medicines.   History of bleeding problems or blood clots.   Previous surgery.   Other health problems, including diabetes and kidney problems.   Possibility of pregnancy, if this applies.  RISKS AND COMPLICATIONS All surgery is associated with risks. Some problems that may occur following this procedure include:  Infection.   Damage to the common bile duct, nerves, arteries, veins, or other  internal organs such as the stomach or intestines.   Bleeding.   A stone may remain in the common bile duct.  BEFORE THE PROCEDURE  Do not take aspirin for 3 days prior to surgery or blood thinners for 1 week prior to surgery.   Do not eat or drink anything after midnight the night before surgery.   Let your caregiver know if you develop a cold or other infectious problem prior to surgery.   You should be present 60 minutes before the procedure or as directed.  PROCEDURE  You will be given medicine that makes you sleep (general anesthetic). When you are asleep, your surgeon will make several small cuts (incisions) in your abdomen. One of these incisions is used to insert a small, lighted scope (laparoscope) into the abdomen. The laparoscope helps the surgeon see into your abdomen. Carbon dioxide gas will be pumped into your abdomen. The gas allows more room for the surgeon to perform your surgery. Other operating instruments are inserted through the other incisions. Laparoscopic procedures may not be appropriate when:  There is major scarring from previous surgery.   The gallbladder is extremely inflamed.   There are bleeding disorders or unexpected cirrhosis of the liver.   A pregnancy is near term.   Other conditions make the laparoscopic procedure impossible.  If your surgeon feels it is not safe to continue with a laparoscopic procedure, he or she will perform an open abdominal procedure. In this case, the surgeon will make an incision to open the abdomen. This gives the surgeon a larger view and field to work within. This may allow the surgeon to perform procedures that sometimes cannot be  performed with a laparoscope alone. Open surgery has a longer recovery time. AFTER THE PROCEDURE  You will be taken to the recovery area where a nurse will watch and check your progress.   You may be allowed to go home the same day.   Do not resume physical activities until directed by your  caregiver.   You may resume a normal diet and activities as directed.  Document Released: 01/28/2005 Document Revised: 10/10/2010 Document Reviewed: 07/13/2010 [facility] Patient Information 2012 [place].

## 2011-03-28 ENCOUNTER — Other Ambulatory Visit: Payer: Self-pay

## 2011-03-28 ENCOUNTER — Inpatient Hospital Stay (HOSPITAL_COMMUNITY)
Admission: EM | Admit: 2011-03-28 | Discharge: 2011-03-30 | DRG: 445 | Disposition: A | Discharge status: Home / Self Care | Payer: Medicare Other | Source: Ambulatory Visit | Attending: Internal Medicine | Admitting: Internal Medicine

## 2011-03-28 ENCOUNTER — Emergency Department (HOSPITAL_COMMUNITY): Payer: Medicare Other

## 2011-03-28 ENCOUNTER — Encounter (HOSPITAL_COMMUNITY): Payer: Self-pay

## 2011-03-28 DIAGNOSIS — N183 Chronic kidney disease, stage 3 unspecified: Secondary | ICD-10-CM | POA: Diagnosis present

## 2011-03-28 DIAGNOSIS — D72829 Elevated white blood cell count, unspecified: Secondary | ICD-10-CM | POA: Diagnosis present

## 2011-03-28 DIAGNOSIS — I2089 Other forms of angina pectoris: Secondary | ICD-10-CM | POA: Diagnosis present

## 2011-03-28 DIAGNOSIS — I129 Hypertensive chronic kidney disease with stage 1 through stage 4 chronic kidney disease, or unspecified chronic kidney disease: Secondary | ICD-10-CM | POA: Diagnosis present

## 2011-03-28 DIAGNOSIS — I208 Other forms of angina pectoris: Secondary | ICD-10-CM | POA: Diagnosis present

## 2011-03-28 DIAGNOSIS — Z860100 Personal history of colon polyps, unspecified: Secondary | ICD-10-CM

## 2011-03-28 DIAGNOSIS — G8929 Other chronic pain: Secondary | ICD-10-CM | POA: Diagnosis present

## 2011-03-28 DIAGNOSIS — K810 Acute cholecystitis: Principal | ICD-10-CM | POA: Diagnosis present

## 2011-03-28 DIAGNOSIS — R079 Chest pain, unspecified: Secondary | ICD-10-CM | POA: Diagnosis present

## 2011-03-28 DIAGNOSIS — I959 Hypotension, unspecified: Secondary | ICD-10-CM | POA: Diagnosis present

## 2011-03-28 DIAGNOSIS — I2510 Atherosclerotic heart disease of native coronary artery without angina pectoris: Secondary | ICD-10-CM | POA: Diagnosis present

## 2011-03-28 DIAGNOSIS — E860 Dehydration: Secondary | ICD-10-CM | POA: Diagnosis present

## 2011-03-28 DIAGNOSIS — M129 Arthropathy, unspecified: Secondary | ICD-10-CM | POA: Diagnosis present

## 2011-03-28 DIAGNOSIS — Z6825 Body mass index (BMI) 25.0-25.9, adult: Secondary | ICD-10-CM

## 2011-03-28 DIAGNOSIS — N400 Enlarged prostate without lower urinary tract symptoms: Secondary | ICD-10-CM | POA: Diagnosis present

## 2011-03-28 DIAGNOSIS — K812 Acute cholecystitis with chronic cholecystitis: Secondary | ICD-10-CM | POA: Diagnosis present

## 2011-03-28 DIAGNOSIS — R0789 Other chest pain: Secondary | ICD-10-CM | POA: Diagnosis present

## 2011-03-28 DIAGNOSIS — Z888 Allergy status to other drugs, medicaments and biological substances status: Secondary | ICD-10-CM

## 2011-03-28 DIAGNOSIS — R509 Fever, unspecified: Secondary | ICD-10-CM | POA: Diagnosis present

## 2011-03-28 DIAGNOSIS — E785 Hyperlipidemia, unspecified: Secondary | ICD-10-CM | POA: Diagnosis present

## 2011-03-28 DIAGNOSIS — I252 Old myocardial infarction: Secondary | ICD-10-CM

## 2011-03-28 DIAGNOSIS — I10 Essential (primary) hypertension: Secondary | ICD-10-CM | POA: Diagnosis present

## 2011-03-28 DIAGNOSIS — K219 Gastro-esophageal reflux disease without esophagitis: Secondary | ICD-10-CM | POA: Diagnosis present

## 2011-03-28 DIAGNOSIS — Z7982 Long term (current) use of aspirin: Secondary | ICD-10-CM

## 2011-03-28 DIAGNOSIS — K8309 Other cholangitis: Secondary | ICD-10-CM | POA: Diagnosis present

## 2011-03-28 DIAGNOSIS — Z79899 Other long term (current) drug therapy: Secondary | ICD-10-CM

## 2011-03-28 DIAGNOSIS — F411 Generalized anxiety disorder: Secondary | ICD-10-CM | POA: Diagnosis present

## 2011-03-28 DIAGNOSIS — Z87442 Personal history of urinary calculi: Secondary | ICD-10-CM

## 2011-03-28 LAB — URINALYSIS, ROUTINE W REFLEX MICROSCOPIC
Bilirubin Urine: NEGATIVE
Glucose, UA: NEGATIVE mg/dL
Ketones, ur: NEGATIVE mg/dL
pH: 5 (ref 5.0–8.0)

## 2011-03-28 LAB — POCT I-STAT TROPONIN I: Troponin i, poc: 0.01 ng/mL (ref 0.00–0.08)

## 2011-03-28 LAB — CBC
HCT: 34.6 % — ABNORMAL LOW (ref 39.0–52.0)
Hemoglobin: 11.3 g/dL — ABNORMAL LOW (ref 13.0–17.0)
MCV: 91.1 fL (ref 78.0–100.0)
RBC: 3.8 MIL/uL — ABNORMAL LOW (ref 4.22–5.81)
RDW: 14.6 % (ref 11.5–15.5)
WBC: 11.6 10*3/uL — ABNORMAL HIGH (ref 4.0–10.5)

## 2011-03-28 LAB — COMPREHENSIVE METABOLIC PANEL
Albumin: 3.8 g/dL (ref 3.5–5.2)
Alkaline Phosphatase: 32 U/L — ABNORMAL LOW (ref 39–117)
BUN: 24 mg/dL — ABNORMAL HIGH (ref 6–23)
CO2: 27 mEq/L (ref 19–32)
Chloride: 102 mEq/L (ref 96–112)
GFR calc non Af Amer: 36 mL/min — ABNORMAL LOW (ref >90)
Potassium: 4.9 mEq/L (ref 3.5–5.1)
Total Bilirubin: 0.4 mg/dL (ref 0.3–1.2)

## 2011-03-28 LAB — LIPASE, BLOOD: Lipase: 31 U/L (ref 11–59)

## 2011-03-28 MED ORDER — ONDANSETRON HCL 4 MG/2ML IJ SOLN
4.0000 mg | Freq: Once | INTRAMUSCULAR | Status: AC
  Administered 2011-03-28: 4 mg via INTRAVENOUS
  Filled 2011-03-28: qty 2

## 2011-03-28 MED ORDER — DOXAZOSIN MESYLATE 8 MG PO TABS
8.0000 mg | ORAL_TABLET | Freq: Every day | ORAL | Status: DC
  Filled 2011-03-28: qty 1

## 2011-03-28 MED ORDER — LISINOPRIL 10 MG PO TABS
10.0000 mg | ORAL_TABLET | Freq: Every day | ORAL | Status: DC
  Filled 2011-03-28: qty 1

## 2011-03-28 MED ORDER — GABAPENTIN 400 MG PO CAPS
400.0000 mg | ORAL_CAPSULE | Freq: Three times a day (TID) | ORAL | Status: DC
  Administered 2011-03-29 – 2011-03-30 (×4): 400 mg via ORAL
  Filled 2011-03-28 (×6): qty 1

## 2011-03-28 MED ORDER — ACETAMINOPHEN 325 MG PO TABS
ORAL_TABLET | ORAL | Status: AC
  Filled 2011-03-28: qty 3

## 2011-03-28 MED ORDER — SIMVASTATIN 40 MG PO TABS
40.0000 mg | ORAL_TABLET | Freq: Every day | ORAL | Status: DC
  Administered 2011-03-29: 40 mg via ORAL
  Filled 2011-03-28 (×2): qty 1

## 2011-03-28 MED ORDER — PIPERACILLIN-TAZOBACTAM 3.375 G IVPB 30 MIN
3.3750 g | Freq: Three times a day (TID) | INTRAVENOUS | Status: DC
  Administered 2011-03-29: 3.375 g via INTRAVENOUS
  Filled 2011-03-28 (×4): qty 50

## 2011-03-28 MED ORDER — ACETAMINOPHEN 500 MG PO TABS
500.0000 mg | ORAL_TABLET | Freq: Four times a day (QID) | ORAL | Status: DC | PRN
  Administered 2011-03-30: 500 mg via ORAL
  Filled 2011-03-28: qty 1

## 2011-03-28 MED ORDER — FLUTICASONE PROPIONATE 50 MCG/ACT NA SUSP
2.0000 | Freq: Every day | NASAL | Status: DC
  Administered 2011-03-29 – 2011-03-30 (×2): 2 via NASAL
  Filled 2011-03-28: qty 16

## 2011-03-28 MED ORDER — NEBIVOLOL HCL 5 MG PO TABS
5.0000 mg | ORAL_TABLET | Freq: Every day | ORAL | Status: DC
  Administered 2011-03-29: 5 mg via ORAL
  Filled 2011-03-28: qty 1

## 2011-03-28 MED ORDER — ENOXAPARIN SODIUM 30 MG/0.3ML ~~LOC~~ SOLN
30.0000 mg | SUBCUTANEOUS | Status: DC
  Administered 2011-03-29: 30 mg via SUBCUTANEOUS
  Filled 2011-03-28: qty 0.3

## 2011-03-28 MED ORDER — CITALOPRAM HYDROBROMIDE 20 MG PO TABS
20.0000 mg | ORAL_TABLET | Freq: Every day | ORAL | Status: DC
  Administered 2011-03-29 – 2011-03-30 (×2): 20 mg via ORAL
  Filled 2011-03-28 (×2): qty 1

## 2011-03-28 MED ORDER — PIPERACILLIN-TAZOBACTAM 3.375 G IVPB
3.3750 g | Freq: Once | INTRAVENOUS | Status: AC
  Administered 2011-03-29: 3.375 g via INTRAVENOUS

## 2011-03-28 MED ORDER — MORPHINE SULFATE 2 MG/ML IJ SOLN: 2.0000 mg | INTRAMUSCULAR | Status: DC | PRN

## 2011-03-28 MED ORDER — SODIUM CHLORIDE 0.9 % IV BOLUS (SEPSIS)
500.0000 mL | Freq: Once | INTRAVENOUS | Status: AC
  Administered 2011-03-28: 500 mL via INTRAVENOUS

## 2011-03-28 MED ORDER — SODIUM CHLORIDE 0.9 % IJ SOLN: 3.0000 mL | Freq: Two times a day (BID) | INTRAMUSCULAR | Status: DC

## 2011-03-28 MED ORDER — ASPIRIN 81 MG PO TBEC
81.0000 mg | DELAYED_RELEASE_TABLET | Freq: Every day | ORAL | Status: DC
  Administered 2011-03-29 – 2011-03-30 (×2): 81 mg via ORAL
  Filled 2011-03-28 (×2): qty 1

## 2011-03-28 MED ORDER — MORPHINE SULFATE 4 MG/ML IJ SOLN
4.0000 mg | Freq: Once | INTRAMUSCULAR | Status: AC
  Administered 2011-03-28: 4 mg via INTRAVENOUS
  Filled 2011-03-28: qty 1

## 2011-03-28 MED ORDER — SODIUM CHLORIDE 0.9 % IV BOLUS (SEPSIS)
1000.0000 mL | Freq: Once | INTRAVENOUS | Status: AC
  Administered 2011-03-28: 1000 mL via INTRAVENOUS

## 2011-03-28 MED ORDER — SODIUM CHLORIDE 0.9 % IV SOLN
250.0000 mL | INTRAVENOUS | Status: DC | PRN
  Administered 2011-03-29: 250 mL via INTRAVENOUS

## 2011-03-28 MED ORDER — LORATADINE 10 MG PO TABS
10.0000 mg | ORAL_TABLET | Freq: Every day | ORAL | Status: DC
  Administered 2011-03-29 – 2011-03-30 (×2): 10 mg via ORAL
  Filled 2011-03-28 (×2): qty 1

## 2011-03-28 MED ORDER — VANCOMYCIN HCL IN DEXTROSE 1-5 GM/200ML-% IV SOLN
1000.0000 mg | INTRAVENOUS | Status: DC
  Filled 2011-03-28: qty 200

## 2011-03-28 MED ORDER — NITROGLYCERIN 0.4 MG SL SUBL
0.4000 mg | SUBLINGUAL_TABLET | SUBLINGUAL | Status: AC | PRN
  Administered 2011-03-28 (×3): 0.4 mg via SUBLINGUAL
  Filled 2011-03-28: qty 75

## 2011-03-28 MED ORDER — PANTOPRAZOLE SODIUM 40 MG PO TBEC
40.0000 mg | DELAYED_RELEASE_TABLET | Freq: Every day | ORAL | Status: DC
  Administered 2011-03-29: 40 mg via ORAL
  Filled 2011-03-28: qty 1

## 2011-03-28 MED ORDER — SODIUM CHLORIDE 0.9 % IV SOLN
1500.0000 mg | INTRAVENOUS | Status: AC
  Administered 2011-03-28: 1500 mg via INTRAVENOUS
  Filled 2011-03-28: qty 1500

## 2011-03-28 MED ORDER — TAMSULOSIN HCL 0.4 MG PO CAPS
0.4000 mg | ORAL_CAPSULE | Freq: Every day | ORAL | Status: DC
  Administered 2011-03-29: 0.4 mg via ORAL
  Filled 2011-03-28 (×2): qty 1

## 2011-03-28 MED ORDER — ACETAMINOPHEN 500 MG PO TABS
1000.0000 mg | ORAL_TABLET | Freq: Four times a day (QID) | ORAL | Status: DC | PRN
  Administered 2011-03-28: 975 mg via ORAL
  Filled 2011-03-28: qty 2

## 2011-03-28 MED ORDER — SODIUM CHLORIDE 0.9 % IJ SOLN
3.0000 mL | Freq: Two times a day (BID) | INTRAMUSCULAR | Status: DC
  Administered 2011-03-29 (×2): 3 mL via INTRAVENOUS

## 2011-03-28 MED ORDER — FUROSEMIDE 20 MG PO TABS
20.0000 mg | ORAL_TABLET | Freq: Every day | ORAL | Status: DC
  Administered 2011-03-29: 20 mg via ORAL
  Filled 2011-03-28: qty 1

## 2011-03-28 MED ORDER — SODIUM CHLORIDE 0.9 % IJ SOLN: 3.0000 mL | INTRAMUSCULAR | Status: DC | PRN

## 2011-03-28 NOTE — Consult Note (Signed)
ANTIBIOTIC CONSULT NOTE - INITIAL  Pharmacy Consult for Vancomycin/Zosyn Indication: Fevers  Allergies  Allergen Reactions  . Clopidogrel Bisulfate Other (See Comments)    H/O diverticulitis; prone to rectal bleeding.  Plavix complicated this.  jkl  . Sulfonamide Derivatives Hives  . Naproxen Rash    Patient Measurements: Height: 5\' 8"  (172.7 cm) Weight: 170 lb (77.111 kg) IBW/kg (Calculated) : 68.4   Vital Signs: Temp: 101.9 F (38.8 C) (02/14 2247) Temp src: Oral (02/14 2247) BP: 109/50 mmHg (02/14 2247) Pulse Rate: 84  (02/14 2247)  Labs:  [facility] 03/28/11 1839  WBC 11.6*  HGB 11.3*  PLT 168  LABCREA --  CREATININE 1.75*   Estimated Creatinine Clearance: 34.7 ml/min (by C-G formula based on Cr of 1.75).  Microbiology: No results found for this or any previous visit (from the past 720 hour(s)).  Medical History: Past Medical History  Diagnosis Date  . Previous back surgery 1997  . Hypertension   . Hemorrhoids   . Chronic kidney disease   . Kidney stones   . Dyslipidemia   . Diverticulosis   . Colon polyps   . Angina   . Heart attack 07/2009  . Blood transfusion   . Anemia   . GI bleeding after 07/2009    "triggered by Plavix & ASA; from my diverticulosis"  . GERD (gastroesophageal reflux disease)   . Headache     occasionally  . Anxiety   . Arthritis     "hips and lower back"  . Chronic back pain greater than 3 months duration   . Heart attack   . Diarrhea   . Arthritis pain   . Cholecystitis    Assessment: 78yom to begin empiric Vancomycin/Zosyn for fevers. Renal insufficiency noted.  Goal of Therapy:  Vancomycin trough level 15-20 mcg/ml  Plan:  1) Vancomycin 1500mg  x 1 then 1000mg  q24 2) Zosyn 3.375g q8 3) Follow renal function, cultures, levels as necessary  Deboraha Sprang 03/28/2011,10:56 PM

## 2011-03-28 NOTE — ED Notes (Signed)
PCXR done

## 2011-03-28 NOTE — ED Notes (Signed)
Up to restroom without event; denies any c/o cp at this time; however, reports feels "gassy"

## 2011-03-28 NOTE — ED Notes (Signed)
MD aware of continued cp - 7/10 - despite NTG x3 SL; orders given

## 2011-03-28 NOTE — ED Provider Notes (Signed)
History     CSN: [id number]  Arrival date & time 03/28/11  1738   First MD Initiated Contact with Patient 03/28/11 1806      Chief Complaint  Patient presents with  . Chest Pain    substernal cp - constant - nonrad. - no associated symptoms - onset at approx 1445 today; received ASA 324 mg and NTG x5 SL PTA - pt reports some relief with meds     (Consider location/radiation/quality/duration/timing/severity/associated sxs/prior treatment) HPI Hx provided by the pt.  78 y.o. M with h/o CAD presenting with CP.  Pain started gradually at ~2:30pm and is centrally located without radiation; described as a tightness.  Mildly worse with movement and min improved after 2 of his nitro SL and 3 given by EMS.  Severity 10/10 initially but now 7/10.  No associated nausea, SOB, or diaphoresis; no fever or cough.  Pt with biliary drain but no known complications, abd pain, or N/V.  Sx's mildly similar to prior.  Pt has not been seen recently for these complaints.  Pt is a pt of [employer].   Past Medical History  Diagnosis Date  . Previous back surgery 1997  . Hypertension   . Hemorrhoids   . Chronic kidney disease   . Kidney stones   . Dyslipidemia   . Diverticulosis   . Colon polyps   . Angina   . Heart attack 07/2009  . Blood transfusion   . Anemia   . GI bleeding after 07/2009    "triggered by Plavix & ASA; from my diverticulosis"  . GERD (gastroesophageal reflux disease)   . Headache     occasionally  . Anxiety   . Arthritis     "hips and lower back"  . Chronic back pain greater than 3 months duration   . Heart attack   . Diarrhea   . Arthritis pain   . Cholecystitis     Past Surgical History  Procedure Date  . Eye surgery 1942  . Back surgery 1997  . Colon surgery     COLONOSCOPY  . Subtotal colectomy 01/24/2010    Family History  Problem Relation Age of Onset  . Heart disease Father   . Colon cancer Neg Hx   . Pancreatic cancer Mother   . Cancer Mother      pancreatic    History  Substance Use Topics  . Smoking status: Never Smoker   . Smokeless tobacco: Never Used  . Alcohol Use: No      Review of Systems  Constitutional: Negative for fever and chills.  HENT: Negative for nosebleeds, congestion and sore throat.   Eyes: Negative for pain and visual disturbance.  Respiratory: Negative for cough and shortness of breath.   Cardiovascular: Positive for chest pain. Negative for palpitations.  Gastrointestinal: Negative for nausea, vomiting, abdominal pain, diarrhea and constipation.  Genitourinary: Negative for dysuria and hematuria.  Musculoskeletal: Negative for back pain and gait problem.  Skin: Negative for rash and wound.  Neurological: Negative for dizziness and headaches.  Psychiatric/Behavioral: Negative for confusion and agitation.  All other systems reviewed and are negative.    Allergies  Clopidogrel bisulfate; Sulfonamide derivatives; and Naproxen  Home Medications   Current Outpatient Rx  Name Route Sig Dispense Refill  . ACETAMINOPHEN 500 MG PO TABS Oral Take 500 mg by mouth every 6 (six) hours as needed. For pain.    . ASPIRIN 81 MG PO TBEC Oral Take 81 mg by mouth daily. Swallow whole.    Marland Kitchen  CITALOPRAM HYDROBROMIDE 20 MG PO TABS Oral Take 20 mg by mouth daily.      Marland Kitchen DOXAZOSIN MESYLATE 8 MG PO TABS Oral Take 8 mg by mouth at bedtime.    Marland Kitchen FEXOFENADINE HCL 180 MG PO TABS Oral Take 180 mg by mouth daily.      Marland Kitchen FLUTICASONE PROPIONATE 50 MCG/ACT NA SUSP Nasal 2 sprays by Nasal route daily.      . FUROSEMIDE 20 MG PO TABS Oral Take 20 mg by mouth daily.    Marland Kitchen GABAPENTIN 400 MG PO CAPS Oral Take 400 mg by mouth 3 (three) times daily.      Marland Kitchen LISINOPRIL 10 MG PO TABS Oral Take 10 mg by mouth daily.      . NEBIVOLOL HCL 5 MG PO TABS Oral Take 5 mg by mouth daily.      Marland Kitchen NITROGLYCERIN 0.4 MG SL SUBL Sublingual Place 0.4 mg under the tongue every 5 (five) minutes as needed. For chest pain.    Marland Kitchen OMEPRAZOLE 20 MG PO CPDR  Oral Take 40 mg by mouth daily.    Marland Kitchen SIMVASTATIN 40 MG PO TABS Oral Take 40 mg by mouth at bedtime.      . TAMSULOSIN HCL 0.4 MG PO CAPS Oral Take 0.4 mg by mouth daily after supper.    . TESTOSTERONE 50 MG/5GM TD GEL Transdermal Place 5 g onto the skin daily.     . TRAMADOL-ACETAMINOPHEN 37.5-325 MG PO TABS Oral Take 1 tablet by mouth every 6 (six) hours as needed. For pain.      BP 116/53  Pulse 76  Temp(Src) 99.1 F (37.3 C) (Oral)  Resp 20  Ht 5\' 8"  (1.727 m)  Wt 170 lb (77.111 kg)  BMI 25.85 kg/m2  SpO2 100%  Physical Exam  Nursing note and vitals reviewed. Constitutional: He is oriented to person, place, and time. No distress.       Mildly obese, wife at bedside  HENT:  Head: Normocephalic and atraumatic.  Right Ear: External ear normal.  Left Ear: External ear normal.  Nose: Nose normal.  Mouth/Throat: Oropharynx is clear and moist.  Eyes: Conjunctivae and EOM are normal. Pupils are equal, round, and reactive to light.  Neck: Normal range of motion. Neck supple.  Cardiovascular: Normal rate, regular rhythm and intact distal pulses.   Murmur (faint systolic heard around the PMI/laterally) heard. Pulmonary/Chest: Effort normal and breath sounds normal. No respiratory distress. He has no wheezes.  Abdominal: Soft. Bowel sounds are normal. There is no tenderness.       Biliary drain in Rt superior flank without local Sn's of infection/cellulitis/drainage.  Musculoskeletal: Normal range of motion. He exhibits edema (trace).  Neurological: He is alert and oriented to person, place, and time.  Skin: Skin is warm and dry. He is not diaphoretic.  Psychiatric: He has a normal mood and affect. Judgment normal.    ED Course  Procedures (including critical care time)  Date: 03/28/2011  Rate: 74  Rhythm: normal sinus rhythm  QRS Axis: normal  Intervals: normal  ST/T Wave abnormalities: normal  Conduction Disutrbances: none  Narrative Interpretation:   Old EKG Reviewed:  No  significant changes noted     Labs Reviewed  CBC - Abnormal; Notable for the following:    WBC 11.6 (*)    RBC 3.80 (*)    Hemoglobin 11.3 (*)    HCT 34.6 (*)    All other components within normal limits  COMPREHENSIVE METABOLIC PANEL - Abnormal;  Notable for the following:    Glucose, Bld 133 (*)    BUN 24 (*)    Creatinine, Ser 1.75 (*)    Alkaline Phosphatase 32 (*)    GFR calc non Af Amer 36 (*)    GFR calc Af Amer 42 (*)    All other components within normal limits  LIPASE, BLOOD  TROPONIN I  POCT I-STAT TROPONIN I  POCT I-STAT TROPONIN I  URINE CULTURE  URINALYSIS, ROUTINE W REFLEX MICROSCOPIC  CULTURE, BLOOD (ROUTINE X 2)  CULTURE, BLOOD (ROUTINE X 2)   Dg Chest Portable 1 View  03/28/2011  *RADIOLOGY REPORT*  Clinical Data: Substernal chest pain, history MI, hypertension  PORTABLE CHEST - 1 VIEW  Comparison: Portable exam 1840 hours compared to 01/29/2011  Findings: Low lung volumes. Enlargement of cardiac silhouette. Mediastinal contours and pulmonary vascularity normal. Bibasilar atelectasis. Lungs otherwise clear. No pleural effusion or pneumothorax. Bowel interposition between liver and diaphragm. Pigtail drainage catheter right upper quadrant, by prior CT a percutaneous cholecystostomy drain.  IMPRESSION: Low lung volumes with bibasilar atelectasis. Enlargement of cardiac silhouette.  Original Report Authenticated By: Burnetta Sabin, M.D.     1. Chest pain       MDM  78 y.o. M with h/o CAD on ASA presenting with central CP min improved after nitro.  C/f ACS/UA.  Doubt PNA, PE, dissection, or PTX.  EKG without acute ischemia and CXR without acute path.  Labs with min elevated creat from prior but NL LFTs and lipase.  Pain min changed after nitro SL but improved after morphine; zofran given 2/2 resultant nausea.    Trop neg.  [place] hospitalist to admit for [facility] cards.  10:31 PM:  Pt now noted to have a fever to 101.4.  Admitting team notified.  No Sn of  PNA on CXR.  UA, urine Cx, blood Cx, bolus, vanc, and zosyn ordered.  GB a possible source; d/w admitting attending; will not get abd imaging at this time.   Pt admitted without status change.       Chana Bode, MD 03/29/11 [phone]

## 2011-03-28 NOTE — H&P (Signed)
Gary [name] is an 78 y.o. male.   Chief Complaint: Chest Pain  HPI: 78 yo with history of CAD S/P Cath in 6/11 with no stenting. On medical management only. Patient here with chest pain for the past one week on and off. It got worse today at 2 pm. He has had up to 7 NTG sl before getting some relief. Associated diaphoresis and radiation down his left arm. No Nausea or vomiting. Pain was 6/10 at its height and occurred at rest. Now chest pain free. No EKG changes and enzymes negative. While in the ED, Patient developed fever of 101.6. Has a biliary drain in place since December after having acute Cholecystitis. Being followed by [facility]. Chest X ray is negative.Has Leucocytosis also.  Past Medical History  Diagnosis Date  . Previous back surgery 1997  . Hypertension   . Hemorrhoids   . Chronic kidney disease   . Kidney stones   . Dyslipidemia   . Diverticulosis   . Colon polyps   . Angina   . Heart attack 07/2009  . Blood transfusion   . Anemia   . GI bleeding after 07/2009    "triggered by Plavix & ASA; from my diverticulosis"  . GERD (gastroesophageal reflux disease)   . Headache     occasionally  . Anxiety   . Arthritis     "hips and lower back"  . Chronic back pain greater than 3 months duration   . Heart attack   . Diarrhea   . Arthritis pain   . Cholecystitis     Past Surgical History  Procedure Date  . Eye surgery 1942  . Back surgery 1997  . Colon surgery     COLONOSCOPY  . Subtotal colectomy 01/24/2010    Family History  Problem Relation Age of Onset  . Heart disease Father   . Colon cancer Neg Hx   . Pancreatic cancer Mother   . Cancer Mother     pancreatic   Social History:  reports that he has never smoked. He has never used smokeless tobacco. He reports that he does not drink alcohol or use illicit drugs.  Allergies:  Allergies  Allergen Reactions  . Clopidogrel Bisulfate Other (See Comments)    H/O diverticulitis; prone to rectal bleeding.  Plavix  complicated this.  jkl  . Sulfonamide Derivatives Hives  . Naproxen Rash    Medications Prior to Admission  Medication Dose Route Frequency Provider Last Rate Last Dose  . morphine 4 MG/ML injection 4 mg  4 mg Intravenous Once Chana Bode, MD   4 mg at 03/28/11 1859  . morphine 4 MG/ML injection 4 mg  4 mg Intravenous Once Chana Bode, MD   4 mg at 03/28/11 1922  . nitroGLYCERIN (NITROSTAT) SL tablet 0.4 mg  0.4 mg Sublingual Q5 min PRN Chana Bode, MD   0.4 mg at 03/28/11 1851  . ondansetron (ZOFRAN) injection 4 mg  4 mg Intravenous Once Chana Bode, MD   4 mg at 03/28/11 2124  . piperacillin-tazobactam (ZOSYN) IVPB 3.375 g  3.375 g Intravenous Once Amy Coker, MD      . sodium chloride 0.9 % bolus 1,000 mL  1,000 mL Intravenous Once Chana Bode, MD   1,000 mL at 03/28/11 2247  . sodium chloride 0.9 % bolus 500 mL  500 mL Intravenous Once Chana Bode, MD   500 mL at 03/28/11 1859  . vancomycin (VANCOCIN) 1,500 mg in sodium chloride 0.9 % 500 mL IVPB  1,500 mg Intravenous To Minor Chana Bode, MD       Medications Prior to Admission  Medication Sig Dispense Refill  . acetaminophen (TYLENOL) 500 MG tablet Take 500 mg by mouth every 6 (six) hours as needed. For pain.      Marland Kitchen aspirin 81 MG EC tablet Take 81 mg by mouth daily. Swallow whole.      . citalopram (CELEXA) 20 MG tablet Take 20 mg by mouth daily.        . fexofenadine (ALLEGRA) 180 MG tablet Take 180 mg by mouth daily.        . fluticasone (FLONASE) 50 MCG/ACT nasal spray 2 sprays by Nasal route daily.        . furosemide (LASIX) 20 MG tablet Take 20 mg by mouth daily.      Marland Kitchen gabapentin (NEURONTIN) 400 MG capsule Take 400 mg by mouth 3 (three) times daily.        Marland Kitchen lisinopril (PRINIVIL,ZESTRIL) 10 MG tablet Take 10 mg by mouth daily.        . nebivolol (BYSTOLIC) 5 MG tablet Take 5 mg by mouth daily.        . nitroGLYCERIN (NITROSTAT) 0.4 MG SL tablet Place 0.4 mg under the tongue every 5 (five) minutes as needed. For chest pain.      Marland Kitchen omeprazole  (PRILOSEC) 20 MG capsule Take 40 mg by mouth daily.      . simvastatin (ZOCOR) 40 MG tablet Take 40 mg by mouth at bedtime.        . Tamsulosin HCl (FLOMAX) 0.4 MG CAPS Take 0.4 mg by mouth daily after supper.      . testosterone (ANDROGEL) 50 MG/5GM GEL Place 5 g onto the skin daily.       . traMADol-acetaminophen (ULTRACET) 37.5-325 MG per tablet Take 1 tablet by mouth every 6 (six) hours as needed. For pain.        Results for orders placed during the hospital encounter of 03/28/11 (from the past 48 hour(s))  CBC     Status: Abnormal   Collection Time   03/28/11  6:39 PM      Component Value Range Comment   WBC 11.6 (*) 4.0 - 10.5 (K/uL)    RBC 3.80 (*) 4.22 - 5.81 (MIL/uL)    Hemoglobin 11.3 (*) 13.0 - 17.0 (g/dL)    HCT 34.6 (*) 39.0 - 52.0 (%)    MCV 91.1  78.0 - 100.0 (fL)    MCH 29.7  26.0 - 34.0 (pg)    MCHC 32.7  30.0 - 36.0 (g/dL)    RDW 14.6  11.5 - 15.5 (%)    Platelets 168  150 - 400 (K/uL)   COMPREHENSIVE METABOLIC PANEL     Status: Abnormal   Collection Time   03/28/11  6:39 PM      Component Value Range Comment   Sodium 138  135 - 145 (mEq/L)    Potassium 4.9  3.5 - 5.1 (mEq/L)    Chloride 102  96 - 112 (mEq/L)    CO2 27  19 - 32 (mEq/L)    Glucose, Bld 133 (*) 70 - 99 (mg/dL)    BUN 24 (*) 6 - 23 (mg/dL)    Creatinine, Ser 1.75 (*) 0.50 - 1.35 (mg/dL)    Calcium 9.5  8.4 - 10.5 (mg/dL)    Total Protein 6.4  6.0 - 8.3 (g/dL)    Albumin 3.8  3.5 - 5.2 (g/dL)    AST  15  0 - 37 (U/L)    ALT 10  0 - 53 (U/L)    Alkaline Phosphatase 32 (*) 39 - 117 (U/L)    Total Bilirubin 0.4  0.3 - 1.2 (mg/dL)    GFR calc non Af Amer 36 (*) >90 (mL/min)    GFR calc Af Amer 42 (*) >90 (mL/min)   LIPASE, BLOOD     Status: Normal   Collection Time   03/28/11  6:39 PM      Component Value Range Comment   Lipase 31  11 - 59 (U/L)   TROPONIN I     Status: Normal   Collection Time   03/28/11  8:13 PM      Component Value Range Comment   Troponin I <0.30  <0.30 (ng/mL)   POCT  I-STAT TROPONIN I     Status: Normal   Collection Time   03/28/11  8:24 PM      Component Value Range Comment   Troponin i, poc 0.02  0.00 - 0.08 (ng/mL)    Comment 3            POCT I-STAT TROPONIN I     Status: Normal   Collection Time   03/28/11  9:44 PM      Component Value Range Comment   Troponin i, poc 0.01  0.00 - 0.08 (ng/mL)    Comment 3             Dg Chest Portable 1 View  03/28/2011  *RADIOLOGY REPORT*  Clinical Data: Substernal chest pain, history MI, hypertension  PORTABLE CHEST - 1 VIEW  Comparison: Portable exam 1840 hours compared to 01/29/2011  Findings: Low lung volumes. Enlargement of cardiac silhouette. Mediastinal contours and pulmonary vascularity normal. Bibasilar atelectasis. Lungs otherwise clear. No pleural effusion or pneumothorax. Bowel interposition between liver and diaphragm. Pigtail drainage catheter right upper quadrant, by prior CT a percutaneous cholecystostomy drain.  IMPRESSION: Low lung volumes with bibasilar atelectasis. Enlargement of cardiac silhouette.  Original Report Authenticated By: Burnetta Sabin, M.D.    Review of Systems  Constitutional: Positive for fever and malaise/fatigue.  Respiratory: Positive for shortness of breath.   Cardiovascular: Positive for chest pain. Negative for palpitations, leg swelling and PND.  All other systems reviewed and are negative.    Blood pressure 109/50, pulse 84, temperature 101.9 F (38.8 C), temperature source Oral, resp. rate 20, height 5\' 8"  (1.727 m), weight 77.111 kg (170 lb), SpO2 97.00%. Physical Exam  Constitutional: He is oriented to person, place, and time. He appears well-developed and well-nourished.  HENT:  Head: Normocephalic and atraumatic.  Right Ear: External ear normal.  Left Ear: External ear normal.  Mouth/Throat: Oropharynx is clear and moist.  Eyes: Conjunctivae and EOM are normal. Pupils are equal, round, and reactive to light.  Neck: Normal range of motion. Neck supple.    Cardiovascular: Normal rate, regular rhythm, normal heart sounds and intact distal pulses.   Respiratory: Effort normal and breath sounds normal.  GI: Soft. Bowel sounds are normal.  Musculoskeletal: Normal range of motion.  Neurological: He is alert and oriented to person, place, and time. He has normal reflexes.  Skin: Skin is warm and dry.  Psychiatric: He has a normal mood and affect. His behavior is normal. Judgment and thought content normal.     Assessment/Plan 1. Angina: patient has known CAD with last Cath in 2011. On medical management only. His symptoms are consistent with stable angina. Will admit to R/O MI,  Consult [facility] in AM for possible repeat Cath or a Lexa scan. Aspirin, NTG, Lovenox and Morphine. 2. Fever, Leucocytosis: CXR is negative. Will check urinalysis, Blood culture and culture the biliary drain. More than likely source is his biliary drain. If so, will get surgical consult for reevaluation. Empiric Vancomycin and zosyn. 3. CKDIII: Follow renal  Function 4. HTN: Blood pressure controlled.  Gary [name],LAWAL 03/28/2011, 10:48 PM

## 2011-03-28 NOTE — ED Notes (Signed)
[id number] Ready

## 2011-03-28 NOTE — ED Notes (Signed)
Pt now reports abd pain as well as continued chest pain; states abd pain similar to his usual GB pain; states only minimal relief of chest pain with NTG and Morphine; MD aware of dame - orders given

## 2011-03-28 NOTE — ED Notes (Signed)
States pain now 5/10 - resting comfortably on stretcher with family at bedside

## 2011-03-28 NOTE — ED Notes (Signed)
Pt appears anxious - states this is secondary to his pain

## 2011-03-28 NOTE — ED Provider Notes (Signed)
I saw and evaluated the patient, reviewed the resident's note and I agree with the findings and plan. 78 year old male, with known coronary artery disease.  He presents to the emergency department with central chest pressure, and shortness of breath.  Today.  He denies nausea, vomiting, fevers, chills, cough, lower extremity swelling.  His pain is worse.  If he takes a deep inspiration.  We will perform cardiac enzymes, and a chest x-ray, for further evaluation and then discuss with his cardiologist, for further management.  His physical examination is normal.  Gary Picker, MD 03/28/11 2009

## 2011-03-29 ENCOUNTER — Encounter (HOSPITAL_COMMUNITY): Payer: Self-pay | Admitting: General Practice

## 2011-03-29 DIAGNOSIS — R079 Chest pain, unspecified: Secondary | ICD-10-CM

## 2011-03-29 DIAGNOSIS — I517 Cardiomegaly: Secondary | ICD-10-CM

## 2011-03-29 HISTORY — DX: Chest pain, unspecified: R07.9

## 2011-03-29 LAB — URINE CULTURE
Colony Count: NO GROWTH
Culture  Setup Time: 201302150112

## 2011-03-29 LAB — CARDIAC PANEL(CRET KIN+CKTOT+MB+TROPI)
CK, MB: 2.6 ng/mL (ref 0.3–4.0)
CK, MB: 4.9 ng/mL — ABNORMAL HIGH (ref 0.3–4.0)
Relative Index: 1.5 (ref 0.0–2.5)
Relative Index: INVALID (ref 0.0–2.5)
Total CK: 354 U/L — ABNORMAL HIGH (ref 7–232)
Total CK: 81 U/L (ref 7–232)
Troponin I: <0.3 ng/mL (ref <0.30)
Troponin I: <0.3 ng/mL (ref <0.30)

## 2011-03-29 LAB — CBC
MCH: 28.9 pg (ref 26.0–34.0)
MCHC: 32.2 g/dL (ref 30.0–36.0)
Platelets: 144 10*3/uL — ABNORMAL LOW (ref 150–400)
RDW: 14.9 % (ref 11.5–15.5)

## 2011-03-29 LAB — PROCALCITONIN: Procalcitonin: 0.52 ng/mL

## 2011-03-29 LAB — CREATININE, SERUM
Creatinine, Ser: 1.75 mg/dL — ABNORMAL HIGH (ref 0.50–1.35)
GFR calc non Af Amer: 36 mL/min — ABNORMAL LOW (ref >90)

## 2011-03-29 MED ORDER — ENOXAPARIN SODIUM 40 MG/0.4ML ~~LOC~~ SOLN
40.0000 mg | SUBCUTANEOUS | Status: DC
  Administered 2011-03-30: 40 mg via SUBCUTANEOUS
  Filled 2011-03-29: qty 0.4

## 2011-03-29 MED ORDER — SODIUM CHLORIDE 0.9 % IV BOLUS (SEPSIS)
1000.0000 mL | Freq: Once | INTRAVENOUS | Status: AC
  Administered 2011-03-29: 1000 mL via INTRAVENOUS

## 2011-03-29 MED ORDER — SODIUM CHLORIDE 0.9 % IV BOLUS (SEPSIS): 1000.0000 mL | Freq: Once | INTRAVENOUS | Status: AC

## 2011-03-29 MED ORDER — PANTOPRAZOLE SODIUM 40 MG PO TBEC
40.0000 mg | DELAYED_RELEASE_TABLET | Freq: Two times a day (BID) | ORAL | Status: DC
  Administered 2011-03-29 – 2011-03-30 (×2): 40 mg via ORAL
  Filled 2011-03-29 (×2): qty 1

## 2011-03-29 MED ORDER — NEBIVOLOL HCL 5 MG PO TABS
5.0000 mg | ORAL_TABLET | Freq: Every day | ORAL | Status: DC
  Administered 2011-03-30: 5 mg via ORAL
  Filled 2011-03-29: qty 1

## 2011-03-29 MED ORDER — MORPHINE SULFATE 2 MG/ML IJ SOLN: 1.0000 mg | INTRAMUSCULAR | Status: DC | PRN

## 2011-03-29 MED ORDER — SODIUM CHLORIDE 0.9 % IV SOLN
INTRAVENOUS | Status: DC
  Administered 2011-03-29: 16:00:00 via INTRAVENOUS

## 2011-03-29 MED ORDER — SODIUM CHLORIDE 0.9 % IV SOLN
3.0000 g | Freq: Four times a day (QID) | INTRAVENOUS | Status: DC
  Administered 2011-03-29 – 2011-03-30 (×4): 3 g via INTRAVENOUS
  Filled 2011-03-29 (×6): qty 3

## 2011-03-29 MED ORDER — MORPHINE SULFATE 2 MG/ML IJ SOLN
2.0000 mg | INTRAMUSCULAR | Status: DC | PRN
  Administered 2011-03-29: 2 mg via INTRAVENOUS
  Filled 2011-03-29: qty 1

## 2011-03-29 NOTE — Progress Notes (Signed)
ANTIBIOTIC CONSULT NOTE - INITIAL  Pharmacy Consult for Unasyn Indication: empiric coverage  Allergies  Allergen Reactions  . Clopidogrel Bisulfate Other (See Comments)    H/O diverticulitis; prone to rectal bleeding.  Plavix complicated this.  jkl  . Sulfonamide Derivatives Hives  . Naproxen Rash    Patient Measurements: Height: 5\' 8"  (172.7 cm) Weight: 190 lb 1.6 oz (86.229 kg) IBW/kg (Calculated) : 68.4   Vital Signs: Temp: 98.6 F (37 C) (02/15 0500) Temp src: Oral (02/15 0500) BP: 84/46 mmHg (02/15 0500) Pulse Rate: 68  (02/15 0500) Intake/Output from previous day:   Intake/Output from this shift: Total I/O In: 360 [P.O.:360] Out: -   Labs:  Basename 03/28/11 2353 03/28/11 1839  WBC 13.3* 11.6*  HGB 10.8* 11.3*  PLT 144* 168  LABCREA -- --  CREATININE 1.75* 1.75*   Estimated Creatinine Clearance: 38.3 ml/min (by C-G formula based on Cr of 1.75).   Microbiology: No results found for this or any previous visit (from the past 720 hour(s)).  Urine cx and blood cx have been ordered and are pending.  Medical History: Past Medical History  Diagnosis Date  . Previous back surgery 1997  . Hypertension   . Hemorrhoids   . Chronic kidney disease   . Kidney stones   . Dyslipidemia   . Diverticulosis   . Colon polyps   . Angina   . Heart attack 07/2009  . Blood transfusion   . Anemia   . GI bleeding after 07/2009    "triggered by Plavix & ASA; from my diverticulosis"  . GERD (gastroesophageal reflux disease)   . Headache     occasionally  . Anxiety   . Arthritis     "hips and lower back"  . Chronic back pain greater than 3 months duration   . Heart attack   . Diarrhea   . Arthritis pain   . Cholecystitis     Medications:  Scheduled:    . acetaminophen      . aspirin  81 mg Oral Daily  . citalopram  20 mg Oral Daily  . doxazosin  8 mg Oral QHS  . enoxaparin  30 mg Subcutaneous Q24H  . fluticasone  2 spray Each Nare Daily  . furosemide  20 mg  Oral Daily  . gabapentin  400 mg Oral TID  . lisinopril  10 mg Oral Daily  . loratadine  10 mg Oral Daily  .  morphine injection  4 mg Intravenous Once  .  morphine injection  4 mg Intravenous Once  . nebivolol  5 mg Oral Daily  . ondansetron (ZOFRAN) IV  4 mg Intravenous Once  . pantoprazole  40 mg Oral BID AC  . piperacillin-tazobactam (ZOSYN)  IV  3.375 g Intravenous Once  . simvastatin  40 mg Oral QHS  . sodium chloride  1,000 mL Intravenous Once  . sodium chloride  500 mL Intravenous Once  . sodium chloride  3 mL Intravenous Q12H  . Tamsulosin HCl  0.4 mg Oral QPC supper  . vancomycin  1,500 mg Intravenous To Minor  . DISCONTD: pantoprazole  40 mg Oral Q1200  . DISCONTD: piperacillin-tazobactam  3.375 g Intravenous Q8H  . DISCONTD: sodium chloride  3 mL Intravenous Q12H  . DISCONTD: vancomycin  1,000 mg Intravenous Q24H   Assessment: 78 yo M with hx of cholecystitis s/p perc drain placement Dec 2012 presents to the ED with fever, leukocytosis, and chest pain.  Originally started on Vancomycin and Zosyn, now  to narrow coverage to Unasyn alone.  Goal of Therapy:  Renal adjustment of medications.  Plan:  Begin Unasyn 3gm IV q6h.  Pt also on Lovenox for VTE prophylaxis.  With CrCl > 30, will increase dose to 40mg  SQ Q24h.  [employer], Pharm.D., BCPS Clinical Pharmacist Pager [phone]  03/29/2011,11:31 AM

## 2011-03-29 NOTE — Progress Notes (Signed)
Patient's manual BP 82/46 with HR 70 in NSR. Patient is afebrile, asymptomatic, and states he "just feels sleepy". Hospitalist on-call paged. No new orders given, will continue to monitor.  Estelle Grumbles, RN 03/29/11

## 2011-03-29 NOTE — Consult Note (Signed)
ADDENDUM to CONSULT NOTE Pt was out of room with initial consult note, this is completion of note.  [id number] recent colds or fevers Skin:no rashes or ulcers HEENT:wears glasses, no blurred vision CV:see HPI PUL:No significant SOB GI:No diarrhea, melena GU:no hematuria MS:no joint pain Neuro:no syncope, no lightheadedness Endo:no diabetes  PHYSICAL EXAM: General:A&O wm, NAD, pleasant affect. Skin:W&D, brisk capillary refill HEENT:normocephalic, glasses in place, sclera clear Neck:supple, no JVD, no carotid bruits Heart:[id number] , no murmur, gallup or rub Lungs:clear without rales, rhonchi or wheezes Abd:+BS, Drain in place Ext:no edema Neuro:alert and oriented X 3, MAE, follows commands.

## 2011-03-29 NOTE — Progress Notes (Signed)
   CARE MANAGEMENT NOTE 03/29/2011  Patient:  Gary [name], Gary [name]   Account Number:  [id number]  Date Initiated:  03/29/2011  Documentation initiated by:  Lars Pinks  Subjective/Objective Assessment:   PT WAS ADMITTED WITH CP, FEVER     Action/Plan:   PROGRESSION OF CARE AND DISCHARGE PLANNING   Anticipated DC Date:  04/03/2011   Anticipated DC Plan:  [place]  CM consult      Choice offered to / List presented to:             Status of service:  In process, will continue to follow Medicare Important Message given?   (If response is "NO", the following Medicare IM given date fields will be blank) Date Medicare IM given:   Date Additional Medicare IM given:    Discharge Disposition:    Per UR Regulation:  Reviewed for med. necessity/level of care/duration of stay  Comments:  UR COMPLETED.  Lars Pinks, RN, BSN 1556 03/29/11 PT WAS ADMITTED WITH CP AND FEVER.  WILL F/U ON DC NEEDS.

## 2011-03-29 NOTE — ED Provider Notes (Signed)
I saw and evaluated the patient, reviewed the resident's note and I agree with the findings and plan.  Elmer Picker, MD 03/29/11 [phone]

## 2011-03-29 NOTE — Consult Note (Signed)
Reason for Consult: Chest pain, assistance with medications and BP parameters, and surgical clearance.   Referring Physician: [facility]   Gary [name] is an 78 y.o. male.    Chief Complaint:  Chest pain for 1 week.  HPI: 78yo with history of CAD S/P Cath in 6/11 with no stenting. On medical management only. Patient here with chest pain for the past one week on and off. It got worse today at 2 pm. He has had up to 7 NTG sl before getting some relief. Associated diaphoresis and radiation down his left arm. No Nausea or vomiting. Pain was 6/10 at its height and occurred at rest. Now chest pain free. No EKG changes and enzymes negative. While in the ED, Patient developed fever of 101.6. Has a biliary drain in place since December after having acute Cholecystitis. Being followed by [facility]. Chest X ray is negative.Has Leucocytosis also.  Cardiac enzymes are negative with CK 81-271 and MB is 2.6-4.9.  Troponin I less than 0.30.   Cardiac history:  In June of 2011 he had non-ST elevation MI and on cardiac catheterization he had a totally occluded circumflex with collaterals to the circumflex vessel and normal LV systolic function. He also had distal LAD disease of 60% stenosis and his second diagonal had 60% stenosis. Right coronary artery was patent.  He was recently seen every 4013 by Dr. Rockne Menghini for surgical clearance for cholecystectomy.  Prior to this admission he had not had any recurrent angina. He had been cleared for general anesthesia, he was felt to be low to moderate risk.  Last cardiac catheterization was September of 2001 and by Dr. Eddie Dibbles note.  I currently cannot find that in the computer.  Currently  Blood pressure 84 systolic.     Past Medical History  Diagnosis Date  . Previous back surgery 1997  . Hypertension   . Hemorrhoids   . Chronic kidney disease   . Kidney stones   . Dyslipidemia   . Diverticulosis   . Colon polyps   . Angina   . Heart attack 07/2009  .  Blood transfusion   . Anemia   . GI bleeding after 07/2009    "triggered by Plavix & ASA; from my diverticulosis"  . GERD (gastroesophageal reflux disease)   . Headache     occasionally  . Anxiety   . Arthritis     "hips and lower back"  . Chronic back pain greater than 3 months duration   . Heart attack   . Diarrhea   . Arthritis pain   . Cholecystitis     Past Surgical History  Procedure Date  . Eye surgery 1942  . Back surgery 1997  . Colon surgery     COLONOSCOPY  . Subtotal colectomy 01/24/2010    Family History  Problem Relation Age of Onset  . Heart disease Father   . Colon cancer Neg Hx   . Pancreatic cancer Mother   . Cancer Mother     pancreatic   Social History:  reports that he has never smoked. He has never used smokeless tobacco. He reports that he does not drink alcohol or use illicit drugs. married 2 children one grandchild does not use tobacco.  Allergies:  Allergies  Allergen Reactions  . Clopidogrel Bisulfate Other (See Comments)    H/O diverticulitis; prone to rectal bleeding.  Plavix complicated this.  jkl  . Sulfonamide Derivatives Hives  . Naproxen Rash    Medications Prior to Admission  Medication Dose Route Frequency Provider Last Rate Last Dose  . acetaminophen (TYLENOL) 325 MG tablet           . acetaminophen (TYLENOL) tablet 1,000 mg  1,000 mg Oral Q6H PRN Chana Bode, MD   975 mg at 03/28/11 2321  . acetaminophen (TYLENOL) tablet 500 mg  500 mg Oral Q6H PRN Barbette Merino, MD      . aspirin EC tablet 81 mg  81 mg Oral Daily Barbette Merino, MD   81 mg at 03/29/11 0958  . citalopram (CELEXA) tablet 20 mg  20 mg Oral Daily Barbette Merino, MD   20 mg at 03/29/11 0958  . doxazosin (CARDURA) tablet 8 mg  8 mg Oral QHS Barbette Merino, MD      . enoxaparin (LOVENOX) injection 30 mg  30 mg Subcutaneous Q24H Barbette Merino, MD   30 mg at 03/29/11 0959  . fluticasone (FLONASE) 50 MCG/ACT nasal spray 2 spray  2 spray Each Nare Daily Barbette Merino, MD   2 spray at  03/29/11 1000  . furosemide (LASIX) tablet 20 mg  20 mg Oral Daily Barbette Merino, MD   20 mg at 03/29/11 0958  . gabapentin (NEURONTIN) capsule 400 mg  400 mg Oral TID Barbette Merino, MD   400 mg at 03/29/11 0958  . lisinopril (PRINIVIL,ZESTRIL) tablet 10 mg  10 mg Oral Daily Barbette Merino, MD      . loratadine (CLARITIN) tablet 10 mg  10 mg Oral Daily Barbette Merino, MD   10 mg at 03/29/11 0958  . morphine 2 MG/ML injection 2 mg  2 mg Intravenous Q4H PRN Barton Dubois, MD      . morphine 4 MG/ML injection 4 mg  4 mg Intravenous Once Chana Bode, MD   4 mg at 03/28/11 1859  . morphine 4 MG/ML injection 4 mg  4 mg Intravenous Once Chana Bode, MD   4 mg at 03/28/11 1922  . nebivolol (BYSTOLIC) tablet 5 mg  5 mg Oral Daily Barbette Merino, MD   5 mg at 03/29/11 0959  . nitroGLYCERIN (NITROSTAT) SL tablet 0.4 mg  0.4 mg Sublingual Q5 min PRN Chana Bode, MD   0.4 mg at 03/28/11 1851  . ondansetron (ZOFRAN) injection 4 mg  4 mg Intravenous Once Chana Bode, MD   4 mg at 03/28/11 2124  . pantoprazole (PROTONIX) EC tablet 40 mg  40 mg Oral BID AC Barton Dubois, MD      . piperacillin-tazobactam (ZOSYN) IVPB 3.375 g  3.375 g Intravenous Once Chana Bode, MD   3.375 g at 03/29/11 0136  . simvastatin (ZOCOR) tablet 40 mg  40 mg Oral QHS Barbette Merino, MD      . sodium chloride 0.9 % bolus 1,000 mL  1,000 mL Intravenous Once Chana Bode, MD   1,000 mL at 03/28/11 2247  . sodium chloride 0.9 % bolus 500 mL  500 mL Intravenous Once Chana Bode, MD   500 mL at 03/28/11 1859  . sodium chloride 0.9 % injection 3 mL  3 mL Intravenous Q12H Barbette Merino, MD   3 mL at 03/29/11 0958  . Tamsulosin HCl (FLOMAX) capsule 0.4 mg  0.4 mg Oral QPC supper Barbette Merino, MD      . vancomycin (VANCOCIN) 1,500 mg in sodium chloride 0.9 % 500 mL IVPB  1,500 mg Intravenous To Minor Amy Coker, MD   1,500 mg at 03/28/11 2256  . DISCONTD: 0.9 %  sodium chloride infusion  250 mL Intravenous PRN  Barbette Merino, MD 10 mL/hr at 03/29/11 0135 250 mL at 03/29/11 0135  .  DISCONTD: morphine 2 MG/ML injection 2 mg  2 mg Intravenous Q3H PRN Barbette Merino, MD      . DISCONTD: pantoprazole (PROTONIX) EC tablet 40 mg  40 mg Oral Q1200 Barbette Merino, MD   40 mg at 03/29/11 0143  . DISCONTD: piperacillin-tazobactam (ZOSYN) IVPB 3.375 g  3.375 g Intravenous Q8H Elmer Picker, MD   3.375 g at 03/29/11 0744  . DISCONTD: sodium chloride 0.9 % injection 3 mL  3 mL Intravenous Q12H Barbette Merino, MD      . DISCONTD: sodium chloride 0.9 % injection 3 mL  3 mL Intravenous PRN Barbette Merino, MD      . DISCONTD: vancomycin (VANCOCIN) IVPB 1000 mg/200 mL premix  1,000 mg Intravenous Q24H Elmer Picker, MD       Medications Prior to Admission  Medication Sig Dispense Refill  . acetaminophen (TYLENOL) 500 MG tablet Take 500 mg by mouth every 6 (six) hours as needed. For pain.      Marland Kitchen aspirin 81 MG EC tablet Take 81 mg by mouth daily. Swallow whole.      . citalopram (CELEXA) 20 MG tablet Take 20 mg by mouth daily.        . fexofenadine (ALLEGRA) 180 MG tablet Take 180 mg by mouth daily.        . fluticasone (FLONASE) 50 MCG/ACT nasal spray 2 sprays by Nasal route daily.        . furosemide (LASIX) 20 MG tablet Take 20 mg by mouth daily.      Marland Kitchen gabapentin (NEURONTIN) 400 MG capsule Take 400 mg by mouth 3 (three) times daily.        Marland Kitchen lisinopril (PRINIVIL,ZESTRIL) 10 MG tablet Take 10 mg by mouth daily.        . nebivolol (BYSTOLIC) 5 MG tablet Take 5 mg by mouth daily.        . nitroGLYCERIN (NITROSTAT) 0.4 MG SL tablet Place 0.4 mg under the tongue every 5 (five) minutes as needed. For chest pain.      Marland Kitchen omeprazole (PRILOSEC) 20 MG capsule Take 40 mg by mouth daily.      . simvastatin (ZOCOR) 40 MG tablet Take 40 mg by mouth at bedtime.        . Tamsulosin HCl (FLOMAX) 0.4 MG CAPS Take 0.4 mg by mouth daily after supper.      . testosterone (ANDROGEL) 50 MG/5GM GEL Place 5 g onto the skin daily.       . traMADol-acetaminophen (ULTRACET) 37.5-325 MG per tablet Take 1 tablet by  mouth every 6 (six) hours as needed. For pain.        Results for orders placed during the hospital encounter of 03/28/11 (from the past 48 hour(s))  CBC     Status: Abnormal   Collection Time   03/28/11  6:39 PM      Component Value Range Comment   WBC 11.6 (*) 4.0 - 10.5 (K/uL)    RBC 3.80 (*) 4.22 - 5.81 (MIL/uL)    Hemoglobin 11.3 (*) 13.0 - 17.0 (g/dL)    HCT 34.6 (*) 39.0 - 52.0 (%)    MCV 91.1  78.0 - 100.0 (fL)    MCH 29.7  26.0 - 34.0 (pg)    MCHC 32.7  30.0 - 36.0 (g/dL)    RDW 14.6  11.5 - 15.5 (%)    Platelets 168  150 -  400 (K/uL)   COMPREHENSIVE METABOLIC PANEL     Status: Abnormal   Collection Time   03/28/11  6:39 PM      Component Value Range Comment   Sodium 138  135 - 145 (mEq/L)    Potassium 4.9  3.5 - 5.1 (mEq/L)    Chloride 102  96 - 112 (mEq/L)    CO2 27  19 - 32 (mEq/L)    Glucose, Bld 133 (*) 70 - 99 (mg/dL)    BUN 24 (*) 6 - 23 (mg/dL)    Creatinine, Ser 1.75 (*) 0.50 - 1.35 (mg/dL)    Calcium 9.5  8.4 - 10.5 (mg/dL)    Total Protein 6.4  6.0 - 8.3 (g/dL)    Albumin 3.8  3.5 - 5.2 (g/dL)    AST 15  0 - 37 (U/L)    ALT 10  0 - 53 (U/L)    Alkaline Phosphatase 32 (*) 39 - 117 (U/L)    Total Bilirubin 0.4  0.3 - 1.2 (mg/dL)    GFR calc non Af Amer 36 (*) >90 (mL/min)    GFR calc Af Amer 42 (*) >90 (mL/min)   LIPASE, BLOOD     Status: Normal   Collection Time   03/28/11  6:39 PM      Component Value Range Comment   Lipase 31  11 - 59 (U/L)   TROPONIN I     Status: Normal   Collection Time   03/28/11  8:13 PM      Component Value Range Comment   Troponin I <0.30  <0.30 (ng/mL)   POCT I-STAT TROPONIN I     Status: Normal   Collection Time   03/28/11  8:24 PM      Component Value Range Comment   Troponin i, poc 0.02  0.00 - 0.08 (ng/mL)    Comment 3            POCT I-STAT TROPONIN I     Status: Normal   Collection Time   03/28/11  9:44 PM      Component Value Range Comment   Troponin i, poc 0.01  0.00 - 0.08 (ng/mL)    Comment 3              URINALYSIS, ROUTINE W REFLEX MICROSCOPIC     Status: Normal   Collection Time   03/28/11 10:55 PM      Component Value Range Comment   Color, Urine YELLOW  YELLOW     APPearance CLEAR  CLEAR     Specific Gravity, Urine 1.014  1.005 - 1.030     pH 5.0  5.0 - 8.0     Glucose, UA NEGATIVE  NEGATIVE (mg/dL)    Hgb urine dipstick NEGATIVE  NEGATIVE     Bilirubin Urine NEGATIVE  NEGATIVE     Ketones, ur NEGATIVE  NEGATIVE (mg/dL)    Protein, ur NEGATIVE  NEGATIVE (mg/dL)    Urobilinogen, UA 0.2  0.0 - 1.0 (mg/dL)    Nitrite NEGATIVE  NEGATIVE     Leukocytes, UA NEGATIVE  NEGATIVE  MICROSCOPIC NOT DONE ON URINES WITH NEGATIVE PROTEIN, BLOOD, LEUKOCYTES, NITRITE, OR GLUCOSE <1000 mg/dL.  CARDIAC PANEL(CRET KIN+CKTOT+MB+TROPI)     Status: Normal   Collection Time   03/28/11 11:49 PM      Component Value Range Comment   Total CK 81  7 - 232 (U/L)    CK, MB 2.6  0.3 - 4.0 (ng/mL)    Troponin I <  0.30  <0.30 (ng/mL)    Relative Index RELATIVE INDEX IS INVALID  0.0 - 2.5    CBC     Status: Abnormal   Collection Time   03/28/11 11:53 PM      Component Value Range Comment   WBC 13.3 (*) 4.0 - 10.5 (K/uL)    RBC 3.74 (*) 4.22 - 5.81 (MIL/uL)    Hemoglobin 10.8 (*) 13.0 - 17.0 (g/dL)    HCT 33.5 (*) 39.0 - 52.0 (%)    MCV 89.6  78.0 - 100.0 (fL)    MCH 28.9  26.0 - 34.0 (pg)    MCHC 32.2  30.0 - 36.0 (g/dL)    RDW 14.9  11.5 - 15.5 (%)    Platelets 144 (*) 150 - 400 (K/uL)   CREATININE, SERUM     Status: Abnormal   Collection Time   03/28/11 11:53 PM      Component Value Range Comment   Creatinine, Ser 1.75 (*) 0.50 - 1.35 (mg/dL)    GFR calc non Af Amer 36 (*) >90 (mL/min)    GFR calc Af Amer 42 (*) >90 (mL/min)   CARDIAC PANEL(CRET KIN+CKTOT+MB+TROPI)     Status: Abnormal   Collection Time   03/29/11  8:45 AM      Component Value Range Comment   Total CK 271 (*) 7 - 232 (U/L)    CK, MB 4.9 (*) 0.3 - 4.0 (ng/mL)    Troponin I <0.30  <0.30 (ng/mL)    Relative Index 1.8  0.0 - 2.5      Dg Chest Portable 1 View  03/28/2011  *RADIOLOGY REPORT*  Clinical Data: Substernal chest pain, history MI, hypertension  PORTABLE CHEST - 1 VIEW  Comparison: Portable exam 1840 hours compared to 01/29/2011  Findings: Low lung volumes. Enlargement of cardiac silhouette. Mediastinal contours and pulmonary vascularity normal. Bibasilar atelectasis. Lungs otherwise clear. No pleural effusion or pneumothorax. Bowel interposition between liver and diaphragm. Pigtail drainage catheter right upper quadrant, by prior CT a percutaneous cholecystostomy drain.  IMPRESSION: Low lung volumes with bibasilar atelectasis. Enlargement of cardiac silhouette.  Original Report Authenticated By: Burnetta Sabin, M.D.    ROS: General: Skin: HEENT: CV: PUL: GI: GU: MS: Neuro: Endo:   Blood pressure 84/46, pulse 68, temperature 98.6 F (37 C), temperature source Oral, resp. rate 18, height 5\' 8"  (1.727 m), weight 190 lb 1.6 oz (86.229 kg), SpO2 98.00%. PE: General: Skin: HEENT: Neck: Heart: Lungs: Abd: Ext: Neuro:   EKG:  Sinus rhythm without acute changes from EKG 01/31/2011  Assessment/Plan Patient Active Problem List  Diagnoses  . HYPERTENSION  . CAD, CFX MI Rx'd medically 6/11  . DIVERTICULOSIS OF COLON  . DIVERTICULOSIS, COLON, WITH HEMORRHAGE, surg 12/11   . RECTAL BLEEDING  . PERSONAL HISTORY OF COLONIC POLYPS  . Abdominal bloating  . Loose stools  . Dyslipidemia  . Cholecystitis, with biliary drain  . BPH (benign prostatic hyperplasia)  . Hyponatremia  . Coronary artery disease with history of myocardial infarction without history of CABG  . Angina at rest  . Fever  . Leucocytosis  . CKD (chronic kidney disease), stage III   PLAN: patient has negative cardiac enzymes currently, chest pain could have been related to acute cholecystitis.  Patient is hypotensive would hold lisinopril and hold or decrease Cardura. Would recommend continuing beta blocker if possible. MD to evaluate   For further recommendations and surgical clearance if possible.  Atlantis Delong R 03/29/2011, 11:11 AM

## 2011-03-29 NOTE — Progress Notes (Signed)
Subjective: Feeling slightly better; no active CP or SOB at this moment. Denies abdominal pain, N/V. Patient reports some granular findings on his drain bag couple of days ago and reports approx 1-2 weeks of no bile PTA.  Objective: Vital signs in last 24 hours: Temp:  [98.4 F (36.9 C)-101.9 F (38.8 C)] 98.4 F (36.9 C) (02/15 1300) Pulse Rate:  [64-84] 64  (02/15 1300) Resp:  [18-20] 18  (02/15 1300) BP: (84-126)/(40-65) 96/59 mmHg (02/15 1300) SpO2:  [95 %-100 %] 96 % (02/15 1300) Weight:  [86.229 kg (190 lb 1.6 oz)] 86.229 kg (190 lb 1.6 oz) (02/14 2340) Weight change:  Last BM Date: 03/28/11  Intake/Output from previous day:   Total I/O In: 600 [P.O.:600] Out: 300 [Urine:300]   Physical Exam: General: Alert, awake, oriented x3, in no acute distress. Non toxic appereance. HEENT: No bruits, no goiter. Heart: Regular rate and rhythm, without murmurs, rubs, gallops. Lungs: Clear to auscultation bilaterally. Abdomen: Soft, nontender, nondistended, positive bowel sounds; right biliary drain in place. Extremities: No clubbing cyanosis or edema with positive pedal pulses. Neuro: Grossly intact, nonfocal.   Lab Results: Basic Metabolic Panel:  Basename 03/28/11 2353 03/28/11 1839  NA -- 138  K -- 4.9  CL -- 102  CO2 -- 27  GLUCOSE -- 133*  BUN -- 24*  CREATININE 1.75* 1.75*  CALCIUM -- 9.5  MG -- --  PHOS -- --   Liver Function Tests:  [facility] 03/28/11 1839  AST 15  ALT 10  ALKPHOS 32*  BILITOT 0.4  PROT 6.4  ALBUMIN 3.8    Basename 03/28/11 1839  LIPASE 31  AMYLASE --   CBC:  Basename 03/28/11 2353 03/28/11 1839  WBC 13.3* 11.6*  NEUTROABS -- --  HGB 10.8* 11.3*  HCT 33.5* 34.6*  MCV 89.6 91.1  PLT 144* 168   Cardiac Enzymes:  Basename 03/29/11 1528 03/29/11 0845 03/28/11 2349  CKTOTAL 354* 271* 81  CKMB 5.3* 4.9* 2.6  CKMBINDEX -- -- --  TROPONINI <0.30 <0.30 <0.30   Thyroid Function Tests:  Basename 03/28/11 2353  TSH 0.825    T4TOTAL --  FREET4 --  T3FREE --  THYROIDAB --   Urine Drug Screen: Drugs of Abuse     Component Value Date/Time   LABOPIA NONE DETECTED 08/09/2009 0521   COCAINSCRNUR NONE DETECTED 08/09/2009 0521   LABBENZ NONE DETECTED 08/09/2009 0521   AMPHETMU NONE DETECTED 08/09/2009 0521   THCU NONE DETECTED 08/09/2009 0521   LABBARB  Value: NONE DETECTED        DRUG SCREEN FOR MEDICAL PURPOSES ONLY.  IF CONFIRMATION IS NEEDED FOR ANY PURPOSE, NOTIFY LAB WITHIN 5 DAYS.        LOWEST DETECTABLE LIMITS FOR URINE DRUG SCREEN Drug Class       Cutoff (ng/mL) Amphetamine      1000 Barbiturate      200 Benzodiazepine   [id number] Tricyclics       [id number] Opiates          300 Cocaine          300 THC              50 08/09/2009 0521    Urinalysis:  Basename 03/28/11 2255  COLORURINE YELLOW  LABSPEC 1.014  PHURINE 5.0  GLUCOSEU NEGATIVE  HGBUR NEGATIVE  BILIRUBINUR NEGATIVE  KETONESUR NEGATIVE  PROTEINUR NEGATIVE  UROBILINOGEN 0.2  NITRITE NEGATIVE  LEUKOCYTESUR NEGATIVE    Studies/Results: Dg Chest Portable 1 View  03/28/2011  *RADIOLOGY REPORT*  Clinical Data: Substernal chest pain, history MI, hypertension  PORTABLE CHEST - 1 VIEW  Comparison: Portable exam 1840 hours compared to 01/29/2011  Findings: Low lung volumes. Enlargement of cardiac silhouette. Mediastinal contours and pulmonary vascularity normal. Bibasilar atelectasis. Lungs otherwise clear. No pleural effusion or pneumothorax. Bowel interposition between liver and diaphragm. Pigtail drainage catheter right upper quadrant, by prior CT a percutaneous cholecystostomy drain.  IMPRESSION: Low lung volumes with bibasilar atelectasis. Enlargement of cardiac silhouette.  Original Report Authenticated By: Burnetta Sabin, M.D.    Medications: Scheduled Meds:   . acetaminophen      . ampicillin-sulbactam (UNASYN) IV  3 g Intravenous Q6H  . aspirin  81 mg Oral Daily  . citalopram  20 mg Oral Daily  . enoxaparin  40 mg Subcutaneous Q24H  . fluticasone  2  spray Each Nare Daily  . gabapentin  400 mg Oral TID  . loratadine  10 mg Oral Daily  .  morphine injection  4 mg Intravenous Once  .  morphine injection  4 mg Intravenous Once  . nebivolol  5 mg Oral Daily  . ondansetron (ZOFRAN) IV  4 mg Intravenous Once  . pantoprazole  40 mg Oral BID AC  . piperacillin-tazobactam (ZOSYN)  IV  3.375 g Intravenous Once  . simvastatin  40 mg Oral QHS  . sodium chloride  1,000 mL Intravenous Once  . sodium chloride  1,000 mL Intravenous Once  . sodium chloride  1,000 mL Intravenous Once  . sodium chloride  1,000 mL Intravenous Once  . sodium chloride  500 mL Intravenous Once  . sodium chloride  3 mL Intravenous Q12H  . Tamsulosin HCl  0.4 mg Oral QPC supper  . vancomycin  1,500 mg Intravenous To Minor  . DISCONTD: doxazosin  8 mg Oral QHS  . DISCONTD: enoxaparin  30 mg Subcutaneous Q24H  . DISCONTD: furosemide  20 mg Oral Daily  . DISCONTD: lisinopril  10 mg Oral Daily  . DISCONTD: nebivolol  5 mg Oral Daily  . DISCONTD: pantoprazole  40 mg Oral Q1200  . DISCONTD: piperacillin-tazobactam  3.375 g Intravenous Q8H  . DISCONTD: sodium chloride  3 mL Intravenous Q12H  . DISCONTD: vancomycin  1,000 mg Intravenous Q24H   Continuous Infusions:   . sodium chloride 50 mL/hr at 03/29/11 1555   PRN Meds:.acetaminophen, acetaminophen, morphine, nitroGLYCERIN, DISCONTD: sodium chloride, DISCONTD: morphine, DISCONTD: morphine, DISCONTD: sodium chloride  Assessment/Plan: 1-Angina at rest: with some atypical components. Troponins negative x #; but with mild elevation on CKMB; no abnormalities on tele; EKG w/o acute changes; 2-D echo w/o wall motion abnormalities and normal EF; patient currently CP free. Cardiology on board will follow recommendations.  2-HYPERTENSION with hypotensive episode: will hold lisinopril for now. Provide fluid resuscitation and follow BP level.  3-Cholecystitis, with biliary drain: some bile seen on drain bag; but per patient almost 2  weeks w/o drainage. Will follow surgery recommendations; continue patient on Unasyn and follow WBC and temp trend.  4-Coronary artery disease with history of myocardial infarction without history of CABG: per cardiology. Continue ASA, statins and B-blockers.  5-Fever and Leucocytosis: borderline SIRS; will continue antibiotics; only identified source for infection is biliary system. Will follow surgery recommendations.  6-CKD (chronic kidney disease), stage III: stable. Continue monitoring Cr.  7-Depression: continue celexa  8-HLD: continue statins  9-DVT:lovenox.    LOS: 1 day   Hadlei Stitt [facility] [phone]  03/29/2011, 6:23 PM

## 2011-03-29 NOTE — Progress Notes (Signed)
Patient ID: Gary [name], male   DOB: [birth year], 78 y.o.   MRN: [id number]    Subjective: Pt presented to the [facility] with c/o tightness and pain in his chest.  It states it hurts worse when he takes a deep breath.  He denies any abdominal pain, nausea, or vomiting.  He saw Dr. Dalbert Batman in the office last week and is set up for the first part of March for a lap possible open chole.  Objective: Vital signs in last 24 hours: Temp:  [98.6 F (37 C)-101.9 F (38.8 C)] 98.6 F (37 C) (02/15 0500) Pulse Rate:  [68-84] 68  (02/15 0500) Resp:  [18-20] 18  (02/15 0500) BP: (84-126)/(40-65) 84/46 mmHg (02/15 0500) SpO2:  [95 %-100 %] 98 % (02/15 0500) Weight:  [170 lb (77.111 kg)-190 lb 1.6 oz (86.229 kg)] 190 lb 1.6 oz (86.229 kg) (02/14 2340) Last BM Date: 03/28/11  Intake/Output from previous day:   Intake/Output this shift:    PE: Abd: soft, NT, ND, +BS, perc chole drain with some bilious output.  Lab Results:   [facility] 03/28/11 2353 03/28/11 1839  WBC 13.3* 11.6*  HGB 10.8* 11.3*  HCT 33.5* 34.6*  PLT 144* 168   BMET  Basename 03/28/11 2353 03/28/11 1839  NA -- 138  K -- 4.9  CL -- 102  CO2 -- 27  GLUCOSE -- 133*  BUN -- 24*  CREATININE 1.75* 1.75*  CALCIUM -- 9.5   PT/INR No results found for this basename: LABPROT:2,INR:2 in the last 72 hours   Studies/Results: Dg Chest Portable 1 View  03/28/2011  *RADIOLOGY REPORT*  Clinical Data: Substernal chest pain, history MI, hypertension  PORTABLE CHEST - 1 VIEW  Comparison: Portable exam 1840 hours compared to 01/29/2011  Findings: Low lung volumes. Enlargement of cardiac silhouette. Mediastinal contours and pulmonary vascularity normal. Bibasilar atelectasis. Lungs otherwise clear. No pleural effusion or pneumothorax. Bowel interposition between liver and diaphragm. Pigtail drainage catheter right upper quadrant, by prior CT a percutaneous cholecystostomy drain.  IMPRESSION: Low lung volumes with bibasilar atelectasis.  Enlargement of cardiac silhouette.  Original Report Authenticated By: Burnetta Sabin, M.D.    Anti-infectives: Anti-infectives     Start     Dose/Rate Route Frequency Ordered Stop   03/29/11 2300   vancomycin (VANCOCIN) IVPB 1000 mg/200 mL premix        1,000 mg 200 mL/hr over 60 Minutes Intravenous Every 24 hours 03/28/11 2301     03/29/11 0700  piperacillin-tazobactam (ZOSYN) IVPB 3.375 g       3.375 g 100 mL/hr over 30 Minutes Intravenous 3 times per day 03/28/11 2301     03/28/11 2245   vancomycin (VANCOCIN) 1,500 mg in sodium chloride 0.9 % 500 mL IVPB        1,500 mg 250 mL/hr over 120 Minutes Intravenous To Minor Emergency Dept 03/28/11 2228 03/29/11 0056   03/28/11 2230  piperacillin-tazobactam (ZOSYN) IVPB 3.375 g       3.375 g 12.5 mL/hr over 240 Minutes Intravenous  Once 03/28/11 2228 03/29/11 0536           Assessment/Plan  1. Chest pain 2. S/p percutaneous cholecystostomy drain placement in 12/12  Plan: 1. The patient's abd appears stable.  He is not having any abdominal pain or problems with his drain.  He does have a slight leukocytosis.  Doubt this is from his gb as his gb is appropriately managed with a drain, but will follow and continue to evaluate.  If  his WBCs continue to increase and no other source is found, he may imaging to make sure everything still appears ok. 2. We will follow with you.   LOS: 1 day    Lexxus Underhill E 03/29/2011

## 2011-03-29 NOTE — Consult Note (Signed)
Pixie Casino, MD, [facility] Attending Cardiologist [facility] [place]

## 2011-03-29 NOTE — Consult Note (Signed)
Pt. Seen and examined. Agree with the NP/PA-C note as written. I agree with Laura's note.  He presented with chest pain which is worse with deep inspiration. This is different from angina in the past, but I cannot exclude it. If he is going to undergo an elective cholecystectomy, I would recommend a stress test first.  However, my main concern now is persistent hypotension. BP meds have been held today, pressures have been in the 99991111 systolic. He had fever to 101.58F overnight in the ER and shaking chills, now has leukocytosis. I'm concerned about biliary sepsis. He did have blood cultures in the ER which are pending as well as drain cultures. He is currently on vancomycin and zosyn empirically.  I would recommend giving a 1L IV fluid bolus now, re-check pressure, and consider additional fluids. Send procalcitonin and venous lactate levels. If bp does not respond to fluids, consider transfer to stepdown and PCCM consult.  We will follow and give more pre-operative recommendations as necessary.   Pixie Casino, MD, [facility] Attending Cardiologist [facility] [place]

## 2011-03-29 NOTE — Progress Notes (Signed)
Abd is benign and there is bile in the drain so this appears to be working fine.  We can follow

## 2011-03-30 DIAGNOSIS — R079 Chest pain, unspecified: Secondary | ICD-10-CM | POA: Diagnosis present

## 2011-03-30 LAB — CBC
MCH: 30.2 pg (ref 26.0–34.0)
Platelets: 190 10*3/uL (ref 150–400)
RBC: 3.41 MIL/uL — ABNORMAL LOW (ref 4.22–5.81)
RDW: 15.7 % — ABNORMAL HIGH (ref 11.5–15.5)

## 2011-03-30 LAB — URINE CULTURE: Culture  Setup Time: 201302150749

## 2011-03-30 MED ORDER — FUROSEMIDE 20 MG PO TABS: 20.0000 mg | ORAL_TABLET | Freq: Every day | ORAL | Status: DC

## 2011-03-30 MED ORDER — LISINOPRIL 10 MG PO TABS: 5.0000 mg | ORAL_TABLET | Freq: Every day | ORAL | Status: DC

## 2011-03-30 MED ORDER — METRONIDAZOLE 500 MG PO TABS: 500.0000 mg | ORAL_TABLET | Freq: Three times a day (TID) | ORAL | Status: DC

## 2011-03-30 MED ORDER — CIPROFLOXACIN HCL 500 MG PO TABS: 500.0000 mg | ORAL_TABLET | Freq: Two times a day (BID) | ORAL | Status: DC

## 2011-03-30 MED ORDER — SODIUM CHLORIDE 0.9 % IV SOLN
3.0000 g | Freq: Three times a day (TID) | INTRAVENOUS | Status: DC
  Administered 2011-03-30: 3 g via INTRAVENOUS
  Filled 2011-03-30 (×3): qty 3

## 2011-03-30 NOTE — Progress Notes (Signed)
Subjective:  No chest pain this am.  Objective:  Vital Signs in the last 24 hours: Temp:  [98.3 F (36.8 C)-99 F (37.2 C)] 98.4 F (36.9 C) (02/16 0800) Pulse Rate:  [64-70] 68  (02/16 0800) Resp:  [18] 18  (02/16 0800) BP: (96-120)/(50-64) 120/62 mmHg (02/16 0800) SpO2:  [91 %-96 %] 92 % (02/16 0800)  Intake/Output from previous day:  Intake/Output Summary (Last 24 hours) at 03/30/11 0939 Last data filed at 03/30/11 [id number]  Gross per 24 hour  Intake    240 ml  Output   2555 ml  Net  -2315 ml    Physical Exam: General appearance: alert, cooperative and no distress Lungs: clear to auscultation bilaterally Heart: regular rate and rhythm   Rate: 68  Rhythm: normal sinus rhythm  Lab Results:  Basename 03/30/11 0630 03/28/11 2353  WBC 12.8* 13.3*  HGB 10.3* 10.8*  PLT 190 144*    Basename 03/28/11 2353 03/28/11 1839  NA -- 138  K -- 4.9  CL -- 102  CO2 -- 27  GLUCOSE -- 133*  BUN -- 24*  CREATININE 1.75* 1.75*    Basename 03/29/11 1528 03/29/11 0845  TROPONINI <0.30 <0.30   Hepatic Function Panel  Basename 03/28/11 1839  PROT 6.4  ALBUMIN 3.8  AST 15  ALT 10  ALKPHOS 32*  BILITOT 0.4  BILIDIR --  IBILI --   No results found for this basename: CHOL in the last 72 hours No results found for this basename: INR in the last 72 hours  Imaging: Imaging results have been reviewed  Cardiac Studies:  Assessment/Plan:   Principal Problem:  *Angina at rest, MI r/o  Active Problems:  Cholecystitis, with biliary drain  CAD, CFX MI Rx'd medically 6/11  HYPERTENSION  Dyslipidemia  Fever  Leucocytosis  CKD (chronic kidney disease), stage III  Plan-MD to see. ? PE- seems unlikely with 2D showing NL RV/RA, no calf pain or swelling, Troponin negative.    Kerin Ransom PA-C 03/30/2011, 9:39 AM

## 2011-03-30 NOTE — Discharge Summary (Signed)
Physician Discharge Summary  Patient ID: Gary [name] MRN: [id number] DOB/AGE: 18-Apr-1932 78 y.o.  Admit date: 03/28/2011 Discharge date: 03/30/2011  Primary Care Physician:  [name], MD, MD   Discharge Diagnoses:   1-Chest discomfort (non- cardiac in etiology) 2-Fever and leukocytosis 3-Hx of Cholecystitis, S/P Biliary drain placement 4-HYPERTENSION with transient hypotension 5-CKD (chronic kidney disease), stage III 6-CAD, CFX MI Rx'd medically 6/11 7-Dyslipidemia 8-BPH (benign prostatic hyperplasia) 9-GERD  Medication List  As of 03/30/2011  1:10 PM   TAKE these medications         aspirin 81 MG EC tablet   Take 81 mg by mouth daily. Swallow whole.      ciprofloxacin 500 MG tablet   Commonly known as: CIPRO   Take 1 tablet (500 mg total) by mouth 2 (two) times daily.   Start taking on: 03/31/2011      citalopram 20 MG tablet   Commonly known as: CELEXA   Take 20 mg by mouth daily.      doxazosin 8 MG tablet   Commonly known as: CARDURA   Take 8 mg by mouth at bedtime.      fexofenadine 180 MG tablet   Commonly known as: ALLEGRA   Take 180 mg by mouth daily.      FLONASE 50 MCG/ACT nasal spray   Generic drug: fluticasone   2 sprays by Nasal route daily.      furosemide 20 MG tablet   Commonly known as: LASIX   Take 1 tablet (20 mg total) by mouth daily. -HOLD FOR 2 DAYS AND THEN RESTART SAME DOSE ON DAILY BASIS.      gabapentin 400 MG capsule   Commonly known as: NEURONTIN   Take 400 mg by mouth 3 (three) times daily.      lisinopril 10 MG tablet   Commonly known as: PRINIVIL,ZESTRIL   Take 0.5 tablets (5 mg total) by mouth daily. -TAKE HALF OF A TABLET BY MOUTH EVERY DAY STARTING ON 03/31/11 UNTIL FOLLOW UP WITH PCP.   Start taking on: 03/31/2011      metroNIDAZOLE 500 MG tablet   Commonly known as: FLAGYL   Take 1 tablet (500 mg total) by mouth 3 (three) times daily.   Start taking on: 03/31/2011      nebivolol 5 MG tablet   Commonly known  as: BYSTOLIC   Take 5 mg by mouth daily.      nitroGLYCERIN 0.4 MG SL tablet   Commonly known as: NITROSTAT   Place 0.4 mg under the tongue every 5 (five) minutes as needed. For chest pain.      omeprazole 20 MG capsule   Commonly known as: PRILOSEC   Take 40 mg by mouth daily.      simvastatin 40 MG tablet   Commonly known as: ZOCOR   Take 40 mg by mouth at bedtime.      Tamsulosin HCl 0.4 MG Caps   Commonly known as: FLOMAX   Take 0.4 mg by mouth daily after supper.      testosterone 50 MG/5GM Gel   Commonly known as: ANDROGEL   Place 5 g onto the skin daily.      traMADol-acetaminophen 37.5-325 MG per tablet   Commonly known as: ULTRACET   Take 1 tablet by mouth every 6 (six) hours as needed. For pain.      TYLENOL 500 MG tablet   Generic drug: acetaminophen   Take 500 mg by mouth every 6 (six) hours  as needed. For pain.             Disposition and Follow-up:  Patient in stable condition; currently w/o any further fevers; good BP and w/o abdominal pain, N/V or CP. Will d/c on cipro and flagyl to complete 10 days total of antibiotics for possible biliary tract infection. He will follow with cardiology for outpatient stress test; and with Dr. Dalbert Batman for elective outpatient cholecystectomy on March. Patient will follow with PCP in 10 days for follow up on chronic problems; readjust antihypertensive drugs if needed and to assure resolution of his symptoms.   Consults:   Cardiology Surgery    Significant Diagnostic Studies:  Dg Chest Portable 1 View  03/28/2011  *RADIOLOGY REPORT*  Clinical Data: Substernal chest pain, history MI, hypertension  PORTABLE CHEST - 1 VIEW  Comparison: Portable exam 1840 hours compared to 01/29/2011  Findings: Low lung volumes. Enlargement of cardiac silhouette. Mediastinal contours and pulmonary vascularity normal. Bibasilar atelectasis. Lungs otherwise clear. No pleural effusion or pneumothorax. Bowel interposition between liver and  diaphragm. Pigtail drainage catheter right upper quadrant, by prior CT a percutaneous cholecystostomy drain.  IMPRESSION: Low lung volumes with bibasilar atelectasis. Enlargement of cardiac silhouette.  Original Report Authenticated By: Burnetta Sabin, M.D.    Brief H and P: 78 y/o male with PMH significant for CAD, HTN, BPH and CKD (stage III); admitted secondary to atypical/typical chest discomfort, fever and leukocytosis. S/P Cath in 6/11 with no stenting. On medical management only. Patient here with chest pain for almost a week PTA on and off. Had up to 7 NTG sl before getting some relief. Associated diaphoresis, hypotension and radiation down his left arm. No Nausea or vomiting. Pain was 6/10 at its height and occurred at rest. While in the ED, Patient developed fever of 101.6. Has a biliary drain in place since December after having acute Cholecystitis. Being followed by [facility]. Chest X ray and UA were negative.   Hospital Course:  1- chest discomfort (non- cardiac in etiology): negative cardiac enzymes, essentially normal 2-D echo and no ischemic changes on EKG or telemetry. Most likely cause for his CP was biliary spasm; but due to risk factors and need for future cholecystectomy, he will follow with cardiology as an outpatient for NST.  2-Fever and leukocytosis: due to biliary infection; will treat with 10 days of antibiotics. Now he will complete 8 more days of cipro and flagyl by mouth and will follow with [facility] and PCP.Marland Kitchen  3-Hx of Cholecystitis, S/P Biliary drain placement: for cholecystectomy in March; meanwhile will follow with Dr. Dalbert Batman as an outpatient.  4-HYPERTENSION with transient hypotension: lisinopril dose cut in half; also lasix stop during hospitalization. Resolved with IVF;s. He will follow with PCP to readjust his antihypertensive drugs.  5-CKD (chronic kidney disease), stage III: stable. Continue outpatient close monitorization.  6-CAD, CFX MI Rx'd medically 6/11:continue ASA,  statins and B-blockers. NST by cardiology as an outpatient.  7-Dyslipidemia:continue statins.  8-BPH (benign prostatic hyperplasia): continue flomax  9-GERD: continue omeprazole.   Time spent on Discharge: 40 minutes  Signed: Pate Aylward 03/30/2011, 1:10 PM

## 2011-03-30 NOTE — Discharge Instructions (Signed)
Chest Pain (Nonspecific) It is often hard to give a specific diagnosis for the cause of chest pain. There is always a chance that your pain could be related to something serious, such as a heart attack or a blood clot in the lungs. You need to follow up with your caregiver for further evaluation. CAUSES   Heartburn.   Pneumonia or bronchitis.   Anxiety and stress.   Inflammation around your heart (pericarditis) or lung (pleuritis or pleurisy).   A blood clot in the lung.   A collapsed lung (pneumothorax). It can develop suddenly on its own (spontaneous pneumothorax) or from injury (trauma) to the chest.  The chest wall is composed of bones, muscles, and cartilage. Any of these can be the source of the pain.  The bones can be bruised by injury.   The muscles or cartilage can be strained by coughing or overwork.   The cartilage can be affected by inflammation and become sore (costochondritis).  DIAGNOSIS  Lab tests or other studies, such as X-rays, an EKG, stress testing, or cardiac imaging, may be needed to find the cause of your pain.  TREATMENT   Treatment depends on what may be causing your chest pain. Treatment may include:   Acid blockers for heartburn.   Anti-inflammatory medicine.   Pain medicine for inflammatory conditions.   Antibiotics if an infection is present.   You may be advised to change lifestyle habits. This includes stopping smoking and avoiding caffeine and chocolate.   You may be advised to keep your head raised (elevated) when sleeping. This reduces the chance of acid going backward from your stomach into your esophagus.   Most of the time, nonspecific chest pain will improve within 2 to 3 days with rest and mild pain medicine.  HOME CARE INSTRUCTIONS   If antibiotics were prescribed, take the full amount even if you start to feel better.   For the next few days, avoid physical activities that bring on chest pain. Continue physical activities as  directed.   Do not smoke cigarettes or drink alcohol until your symptoms are gone.   Only take over-the-counter or prescription medicine for pain, discomfort, or fever as directed by your caregiver.   Follow your caregiver's suggestions for further testing if your chest pain does not go away.   Keep any follow-up appointments you made. If you do not go to an appointment, you could develop lasting (chronic) problems with pain. If there is any problem keeping an appointment, you must call to reschedule.  SEEK MEDICAL CARE IF:   You think you are having problems from the medicine you are taking. Read your medicine instructions carefully.   Your chest pain does not go away, even after treatment.   You develop a rash with blisters on your chest.  SEEK IMMEDIATE MEDICAL CARE IF:   You have increased chest pain or pain that spreads to your arm, neck, jaw, back, or belly (abdomen).   You develop shortness of breath, an increasing cough, or you are coughing up blood.   You have severe back or abdominal pain, feel sick to your stomach (nauseous) or throw up (vomit).   You develop severe weakness, fainting, or chills.   You have an oral temperature above 102 F (38.9 C), not controlled by medicine.  THIS IS AN EMERGENCY. Do not wait to see if the pain will go away. Get medical help at once. Call your local emergency services (911 in [place] Do not drive yourself to   the hospital. MAKE SURE YOU:   Understand these instructions.   Will watch your condition.   Will get help right away if you are not doing well or get worse.  Document Released: 11/07/2004 Document Revised: 10/10/2010 Document Reviewed: 09/03/2007 Exit[employer] Patient Information 2012 [employer]. 

## 2011-03-30 NOTE — Progress Notes (Signed)
Pt. Seen and examined. Agree with the NP/PA-C note as written.  Blood pressure improved today, but off of bp medications .Marland Kitchen WBC count is trending down. Labs do not suggest sepsis .Marland Kitchen This may be dehydration secondary to fever or infection.  Chest pain, which was pleuritic yesterday, has resolved. No evidence to support acute coronary syndrome. Elevated CK likely of skeletal muscle. Given his cardiac history, would recommend an outpatient NST in our office. I would slowly reintroduce lisinopril at a lower dose if bp tolerates. Will sign off. Call with questions.  Pixie Casino, MD, [facility] Attending Cardiologist [facility] [place]

## 2011-03-30 NOTE — Progress Notes (Signed)
ANTIBIOTIC CONSULT NOTE - INITIAL  Pharmacy Consult for Unasyn Indication: empiric coverage  Allergies  Allergen Reactions  . Naproxen Rash  . Clopidogrel Bisulfate Other (See Comments)    H/O diverticulitis; prone to rectal bleeding.  Plavix complicated this.  jkl  . Sulfonamide Derivatives Hives    Patient Measurements: Height: 5\' 8"  (172.7 cm) Weight: 190 lb 1.6 oz (86.229 kg) IBW/kg (Calculated) : 68.4   Vital Signs: Temp: 98.4 F (36.9 C) (02/16 0800) Temp src: Oral (02/16 0800) BP: 120/62 mmHg (02/16 0800) Pulse Rate: 68  (02/16 0800) Intake/Output from previous day: 02/15 0701 - 02/16 0700 In: 600 [P.O.:600] Out: 1775 [Urine:1775] Intake/Output from this shift: Total I/O In: -  Out: 780 [Urine:780]  Labs:  [facility] 03/30/11 0630 03/28/11 2353 03/28/11 1839  WBC 12.8* 13.3* 11.6*  HGB 10.3* 10.8* 11.3*  PLT 190 144* 168  LABCREA -- -- --  CREATININE -- 1.75* 1.75*   Estimated Creatinine Clearance: 38.3 ml/min (by C-G formula based on Cr of 1.75).   Microbiology: Recent Results (from the past 720 hour(s))  URINE CULTURE     Status: Normal   Collection Time   03/28/11 10:55 PM      Component Value Range Status Comment   Specimen Description URINE, CLEAN CATCH   Final    Special Requests NONE   Final    Culture  Setup Time [id number]   Final    Colony Count NO GROWTH   Final    Culture NO GROWTH   Final    Report Status 03/29/2011 FINAL   Final   URINE CULTURE     Status: Normal   Collection Time   03/29/11  6:50 AM      Component Value Range Status Comment   Specimen Description URINE, RANDOM   Final    Special Requests NONE   Final    Culture  Setup Time [id number]   Final    Colony Count NO GROWTH   Final    Culture NO GROWTH   Final    Report Status 03/30/2011 FINAL   Final   BODY FLUID CULTURE     Status: Normal (Preliminary result)   Collection Time   03/29/11  6:50 AM      Component Value Range Status Comment   Specimen Description  FLUID   Final    Special Requests FROM BILIARY DRAIN [place]   Final    Gram Stain     Final    Value: NO WBC SEEN     NO ORGANISMS SEEN   Culture PENDING   Incomplete    Report Status PENDING   Incomplete     Urine cx and blood cx have been ordered and are pending.  Medical History: Past Medical History  Diagnosis Date  . Hypertension   . Hemorrhoids   . Chronic kidney disease   . Kidney stones   . Dyslipidemia   . Diverticulosis   . Colon polyps   . Angina   . Blood transfusion   . Anemia   . GI bleeding after 07/2009    "triggered by Plavix & ASA; from my diverticulosis"  . GERD (gastroesophageal reflux disease)   . Headache     occasionally  . Anxiety   . Arthritis     "hips and lower back"  . Chronic back pain greater than 3 months duration   . Diarrhea   . Arthritis pain   . Cholecystitis   . Heart attack 07/2009  Medications:  Scheduled:     . acetaminophen      . ampicillin-sulbactam (UNASYN) IV  3 g Intravenous Q6H  . aspirin  81 mg Oral Daily  . citalopram  20 mg Oral Daily  . enoxaparin  40 mg Subcutaneous Q24H  . fluticasone  2 spray Each Nare Daily  . gabapentin  400 mg Oral TID  . loratadine  10 mg Oral Daily  . nebivolol  5 mg Oral Daily  . pantoprazole  40 mg Oral BID AC  . simvastatin  40 mg Oral QHS  . sodium chloride  1,000 mL Intravenous Once  . sodium chloride  1,000 mL Intravenous Once  . sodium chloride  1,000 mL Intravenous Once  . sodium chloride  3 mL Intravenous Q12H  . Tamsulosin HCl  0.4 mg Oral QPC supper  . DISCONTD: doxazosin  8 mg Oral QHS  . DISCONTD: enoxaparin  30 mg Subcutaneous Q24H  . DISCONTD: furosemide  20 mg Oral Daily  . DISCONTD: lisinopril  10 mg Oral Daily  . DISCONTD: nebivolol  5 mg Oral Daily  . DISCONTD: pantoprazole  40 mg Oral Q1200  . DISCONTD: piperacillin-tazobactam  3.375 g Intravenous Q8H  . DISCONTD: vancomycin  1,000 mg Intravenous Q24H   Assessment: 78 yo M with hx of cholecystitis s/p perc  drain placement Dec 2012 presents to the ED with fever, leukocytosis, and chest pain.  Originally started on Vancomycin and Zosyn, now to narrow coverage to Unasyn alone.  Goal of Therapy:  Renal adjustment of medications.  Plan:  Change Unasyn to 3g IV q8

## 2011-03-30 NOTE — Plan of Care (Signed)
Problem: Discharge Progression Outcomes Goal: Complications resolved/controlled Outcome: Progressing R/O chest pain as MI, pt to have med changes for blood pressure meds will f/u with Dr. Dalbert Batman for surgical intervention on 3/1

## 2011-03-30 NOTE — Progress Notes (Signed)
Subjective: No abdominal pain  Objective: Vital signs in last 24 hours: Temp:  [98.3 F (36.8 C)-99 F (37.2 C)] 98.4 F (36.9 C) (02/16 0800) Pulse Rate:  [64-70] 68  (02/16 0800) Resp:  [18] 18  (02/16 0800) BP: (96-120)/(51-64) 120/62 mmHg (02/16 0800) SpO2:  [91 %-96 %] 92 % (02/16 0800) Last BM Date: 03/28/11  Intake/Output from previous day: 02/15 0701 - 02/16 0700 In: 600 [P.O.:600] Out: 1775 [Urine:1775] Intake/Output this shift: Total I/O In: 240 [P.O.:240] Out: 780 [Urine:780]  Abdomen soft non tender.  drain site clean.  Lab Results:   Basename 03/30/11 0630 03/28/11 2353  WBC 12.8* 13.3*  HGB 10.3* 10.8*  HCT 30.9* 33.5*  PLT 190 144*   BMET  Basename 03/28/11 2353 03/28/11 1839  NA -- 138  K -- 4.9  CL -- 102  CO2 -- 27  GLUCOSE -- 133*  BUN -- 24*  CREATININE 1.75* 1.75*  CALCIUM -- 9.5   PT/INR No results found for this basename: LABPROT:2,INR:2 in the last 72 hours ABG No results found for this basename: PHART:2,PCO2:2,PO2:2,HCO3:2 in the last 72 hours  Studies/Results: Dg Chest Portable 1 View  03/28/2011  *RADIOLOGY REPORT*  Clinical Data: Substernal chest pain, history MI, hypertension  PORTABLE CHEST - 1 VIEW  Comparison: Portable exam 1840 hours compared to 01/29/2011  Findings: Low lung volumes. Enlargement of cardiac silhouette. Mediastinal contours and pulmonary vascularity normal. Bibasilar atelectasis. Lungs otherwise clear. No pleural effusion or pneumothorax. Bowel interposition between liver and diaphragm. Pigtail drainage catheter right upper quadrant, by prior CT a percutaneous cholecystostomy drain.  IMPRESSION: Low lung volumes with bibasilar atelectasis. Enlargement of cardiac silhouette.  Original Report Authenticated By: Burnetta Sabin, M.D.    Anti-infectives: Anti-infectives     Start     Dose/Rate Route Frequency Ordered Stop   03/30/11 1400   Ampicillin-Sulbactam (UNASYN) 3 g in sodium chloride 0.9 % 100 mL IVPB          3 g 100 mL/hr over 60 Minutes Intravenous Every 8 hours 03/30/11 1003     03/29/11 2300   vancomycin (VANCOCIN) IVPB 1000 mg/200 mL premix  Status:  Discontinued        1,000 mg 200 mL/hr over 60 Minutes Intravenous Every 24 hours 03/28/11 2301 03/29/11 1042   03/29/11 1200   Ampicillin-Sulbactam (UNASYN) 3 g in sodium chloride 0.9 % 100 mL IVPB  Status:  Discontinued        3 g 100 mL/hr over 60 Minutes Intravenous Every 6 hours 03/29/11 1139 03/30/11 1003   03/29/11 0700   piperacillin-tazobactam (ZOSYN) IVPB 3.375 g  Status:  Discontinued        3.375 g 100 mL/hr over 30 Minutes Intravenous 3 times per day 03/28/11 2301 03/29/11 1042   03/28/11 2245   vancomycin (VANCOCIN) 1,500 mg in sodium chloride 0.9 % 500 mL IVPB        1,500 mg 250 mL/hr over 120 Minutes Intravenous To [facility] Dept 03/28/11 2228 03/29/11 0056   03/28/11 2230   piperacillin-tazobactam (ZOSYN) IVPB 3.375 g        3.375 g 12.5 mL/hr over 240 Minutes Intravenous  Once 03/28/11 2228 03/29/11 0536          Assessment/Plan: s/p * No surgery found * On schedule for LGB by Dr Dalbert Batman on march 1.  Tube working fine and exam benign. He can F/U with Dalbert Batman as outpatient but can be D/C  From our standpoint. Patient Active Problem List  Diagnoses  . HYPERTENSION  . CAD, CFX MI Rx'd medically 6/11  . DIVERTICULOSIS OF COLON  . DIVERTICULOSIS, COLON, WITH HEMORRHAGE, surg 12/11   . RECTAL BLEEDING  . PERSONAL HISTORY OF COLONIC POLYPS  . Abdominal bloating  . Loose stools  . Dyslipidemia  . Cholecystitis, with biliary drain  . BPH (benign prostatic hyperplasia)  . Hyponatremia  . Angina at rest  . Fever  . Leucocytosis  . CKD (chronic kidney disease), stage III    Gary Bennett A. 03/30/2011

## 2011-04-01 ENCOUNTER — Encounter (HOSPITAL_COMMUNITY): Payer: Self-pay | Admitting: Pharmacy Technician

## 2011-04-02 LAB — BODY FLUID CULTURE: Gram Stain: NONE SEEN

## 2011-04-04 DIAGNOSIS — Z9289 Personal history of other medical treatment: Secondary | ICD-10-CM

## 2011-04-04 HISTORY — DX: Personal history of other medical treatment: Z92.89

## 2011-04-04 LAB — CULTURE, BLOOD (ROUTINE X 2)
Culture  Setup Time: 201302150346
Culture: NO GROWTH

## 2011-04-05 ENCOUNTER — Other Ambulatory Visit (HOSPITAL_COMMUNITY): Payer: Medicare Other

## 2011-04-09 ENCOUNTER — Encounter (HOSPITAL_COMMUNITY)
Admission: RE | Admit: 2011-04-09 | Discharge: 2011-04-09 | Disposition: A | Discharge status: Home / Self Care | Payer: Medicare Other | Source: Ambulatory Visit | Attending: Anesthesiology | Admitting: Anesthesiology

## 2011-04-09 ENCOUNTER — Encounter (HOSPITAL_COMMUNITY)
Admission: RE | Admit: 2011-04-09 | Discharge: 2011-04-09 | Disposition: A | Discharge status: Home / Self Care | Payer: Medicare Other | Source: Ambulatory Visit | Attending: General Surgery | Admitting: General Surgery

## 2011-04-09 LAB — CBC
MCH: 29.6 pg (ref 26.0–34.0)
Platelets: 300 10*3/uL (ref 150–400)
RBC: 3.71 MIL/uL — ABNORMAL LOW (ref 4.22–5.81)

## 2011-04-09 LAB — COMPREHENSIVE METABOLIC PANEL
ALT: 9 U/L (ref 0–53)
AST: 13 U/L (ref 0–37)
Albumin: 3.4 g/dL — ABNORMAL LOW (ref 3.5–5.2)
CO2: 25 mEq/L (ref 19–32)
Calcium: 9.6 mg/dL (ref 8.4–10.5)
GFR calc non Af Amer: 41 mL/min — ABNORMAL LOW (ref >90)
Sodium: 137 mEq/L (ref 135–145)
Total Protein: 6.9 g/dL (ref 6.0–8.3)

## 2011-04-09 LAB — URINALYSIS, ROUTINE W REFLEX MICROSCOPIC
Bilirubin Urine: NEGATIVE
Glucose, UA: NEGATIVE mg/dL
Ketones, ur: NEGATIVE mg/dL
pH: 5 (ref 5.0–8.0)

## 2011-04-09 LAB — DIFFERENTIAL
Eosinophils Relative: 3 % (ref 0–5)
Lymphocytes Relative: 8 % — ABNORMAL LOW (ref 12–46)
Lymphs Abs: 0.7 10*3/uL (ref 0.7–4.0)
Monocytes Absolute: 0.6 10*3/uL (ref 0.1–1.0)
Monocytes Relative: 7 % (ref 3–12)

## 2011-04-09 LAB — SURGICAL PCR SCREEN: MRSA, PCR: NEGATIVE

## 2011-04-09 MED ORDER — CHLORHEXIDINE GLUCONATE 4 % EX LIQD
1.0000 "application " | Freq: Once | CUTANEOUS | Status: DC
  Filled 2011-04-09: qty 15

## 2011-04-09 NOTE — Pre-Procedure Instructions (Signed)
[place]  04/09/2011   Your procedure is scheduled on:  March 1 (Friday)  Report to [place] at 5:30 AM.  Call this number if you have problems the morning of surgery: [phone]   Remember:   Do not eat food:After Midnight.  May have clear liquids: up to 4 Hours before arrival.  Clear liquids include soda, tea, black coffee, apple or grape juice, broth.  Take these medicines the morning of surgery with A SIP OF WATER: citalopram, cardura, allegra, bystolic, prilosec, flomax, ultracet   Do not wear jewelry, make-up or nail polish.  Do not wear lotions, powders, or perfumes. You may wear deodorant.  Do not shave 48 hours prior to surgery.  Do not bring valuables to the hospital.  Contacts, dentures or bridgework may not be worn into surgery.  Leave suitcase in the car. After surgery it may be brought to your room.  For patients admitted to the hospital, checkout time is 11:00 AM the day of discharge.   Patients discharged the day of surgery will not be allowed to drive home.  Name and phone number of your driver: NA  Special Instructions: CHG Shower Use Special Wash: 1/2 bottle night before surgery and 1/2 bottle morning of surgery.   Please read over the following fact sheets that you were given: Pain Booklet, MRSA Information and Surgical Site Infection Prevention

## 2011-04-11 MED ORDER — ERTAPENEM SODIUM 1 G IJ SOLR
1.0000 g | INTRAMUSCULAR | Status: AC
  Administered 2011-04-12: 1 g via INTRAVENOUS
  Filled 2011-04-11: qty 1

## 2011-04-11 MED ORDER — HEPARIN SODIUM (PORCINE) 5000 UNIT/ML IJ SOLN
5000.0000 [IU] | Freq: Once | INTRAMUSCULAR | Status: AC
  Administered 2011-04-12: 5000 [IU] via SUBCUTANEOUS
  Filled 2011-04-11: qty 1

## 2011-04-11 NOTE — H&P (Signed)
Gary [name]    MRN: [id number]   Description: 78 year old male  Provider: Adin Hector, MD  Department: [facility]     Diagnoses     Chronic cholecystitis with calculus   - Primary    574.10   Vitals     BP Pulse Temp(Src) Resp Ht Wt    112/66  62  97.8 F (36.6 C) (Temporal)  18  5' 8.5" (1.74 m)  191 lb (86.637 kg)     BMI - 28.62 kg/m2                 History and Physical   Adin Hector, MD Patient ID: Gary [name], male   DOB: 1933/01/07, 78 y.o.   MRN: [id number]       HPI   Gary [name] is a 78 y.o. male. Gary [name] was hospitalized on January 22, 2011 through February 04, 2011. He had nausea, vomiting, diarrhea and abdominal pain. The diagnosis was not immediately clear but ultimately a CT scan showed severe acute cholecystitis. Because of cardiovascular instability a decision was made to proceed with percutaneous cholecystostomy. He did recover. He was seen by Dr. Georgette Dover., Dr. Rise Patience, Dr. Marlou Starks, and Dr. Ninfa Linden during this hospitalization.   He has a significant past surgical history of a subtotal colectomy by me on January 24, 2010 because of persistent lower GI bleeding from diverticulosis. He has recovered from that surgery and he keeps his diarrhea under control by watching what he eats.   Since his discharge from the hospital recently he is feeling better. He has no more nausea or vomiting. He has no more pain. No more fever. He has completed his course of antibiotics. The only anticoagulant he takes is aspirin.   His primary care physician is Dr. Conni Slipper.  He has seen Dr. Aldona Bar recently, and has been cleared for surgery to remove his gallbladder with moderate cardiovascular risk.  He still has a drain going in through his right posterior flank. The drain was recently studied by interventional radiology. The drain is well positioned but the cystic duct is obstructed. The drainage has gone down to almost nothing now but he has  no other symptoms and there is no drainage around the drain to the skin. He continues to eat reasonably well. He states he has gained about 6 pounds since his hospitalization, although he lost 20 pounds as a result of his illness in December.     He is ready to proceed with cholecystectomy.       Past Medical History   Diagnosis  Date   .  Previous back surgery  1997   .  Hypertension     .  Hemorrhoids     .  Chronic kidney disease     .  Kidney stones     .  Dyslipidemia     .  Diverticulosis     .  Colon polyps     .  Angina     .  Heart attack  07/2009   .  Blood transfusion     .  Anemia     .  GI bleeding  after 07/2009       "triggered by Plavix & ASA; from my diverticulosis"   .  GERD (gastroesophageal reflux disease)     .  Headache         occasionally   .  Anxiety     .  Arthritis         "  hips and lower back"   .  Chronic back pain greater than 3 months duration     .  Heart attack     .  Diarrhea     .  Arthritis pain     .  Cholecystitis         Past Surgical History   Procedure  Date   .  Eye surgery  1942   .  Back surgery  1997   .  Colon surgery         COLONOSCOPY   .  Subtotal colectomy  01/24/2010       Family History   Problem  Relation  Age of Onset   .  Heart disease  Father     .  Colon cancer  Neg Hx     .  Pancreatic cancer  Mother     .  Cancer  Mother         pancreatic      Social History History   Substance Use Topics   .  Smoking status:  Never Smoker    .  Smokeless tobacco:  Never Used   .  Alcohol Use:  No       Allergies   Allergen  Reactions   .  Clopidogrel Bisulfate  Other (See Comments)       H/O diverticulitis; prone to rectal bleeding.  Plavix complicated this.  jkl   .  Sulfonamide Derivatives  Hives   .  Naproxen  Rash       Current Outpatient Prescriptions   Medication  Sig  Dispense  Refill   .  furosemide (LASIX) 20 MG tablet  Take 20 mg by mouth daily.         Marland Kitchen  acetaminophen (TYLENOL) 500 MG  tablet  Take 500 mg by mouth every 6 (six) hours as needed.           Marland Kitchen  aspirin (ASPIRIN EC) 81 MG EC tablet  Take 1 tablet (81 mg total) by mouth daily. Swallow whole.   30 tablet   12   .  citalopram (CELEXA) 20 MG tablet  Take 20 mg by mouth daily.           Marland Kitchen  DOXAZOSIN MESYLATE PO  Take 8 mg by mouth daily.           .  fexofenadine (ALLEGRA) 180 MG tablet  Take 180 mg by mouth daily.           .  fluticasone (FLONASE) 50 MCG/ACT nasal spray  2 sprays by Nasal route daily.           Marland Kitchen  gabapentin (NEURONTIN) 400 MG capsule  Take 400 mg by mouth 3 (three) times daily.           Marland Kitchen  lisinopril (PRINIVIL,ZESTRIL) 10 MG tablet  Take 10 mg by mouth daily.           .  nebivolol (BYSTOLIC) 5 MG tablet  Take 5 mg by mouth daily.           .  nitroGLYCERIN (NITROSTAT) 0.4 MG SL tablet  Place 0.4 mg under the tongue every 5 (five) minutes as needed.           Marland Kitchen  omeprazole (PRILOSEC) 20 MG capsule  Take 2 capsules (40 mg total) by mouth daily.   30 capsule   1   .  simvastatin (ZOCOR) 40 MG tablet  Take 40 mg  by mouth at bedtime.           .  Tamsulosin HCl (FLOMAX) 0.4 MG CAPS  Take 1 capsule (0.4 mg total) by mouth daily after supper.   30 capsule   0   .  testosterone (ANDROGEL) 50 MG/5GM GEL  Place 5 g onto the skin daily.           .  traMADol-acetaminophen (ULTRACET) 37.5-325 MG per tablet  Take 1 tablet by mouth every 6 (six) hours as needed.              Review  of Systems  Constitutional: Negative for fever, chills and unexpected weight change.  HENT: Negative for hearing loss, congestion, sore throat, trouble swallowing and voice change.   Eyes: Negative for visual disturbance.  Respiratory: Negative for cough and wheezing.   Cardiovascular: Negative for chest pain, palpitations and leg swelling.  Gastrointestinal: Negative for nausea, vomiting, abdominal pain, diarrhea, constipation, blood in stool, abdominal distention, anal bleeding and rectal pain.  Genitourinary: Negative for  hematuria and difficulty urinating.  Musculoskeletal: Negative for arthralgias.  Skin: Negative for rash and wound.  Neurological: Negative for seizures, syncope, weakness and headaches.  Hematological: Negative for adenopathy. Does not bruise/bleed easily.  Psychiatric/Behavioral: Negative for confusion.    Blood pressure 112/66, pulse 62, temperature 97.8 F (36.6 C), temperature source Temporal, resp. rate 18, height 5' 8.5" (1.74 m), weight 191 lb (86.637 kg).   Physical Exam  Constitutional: He is oriented to person, place, and time. He appears well-developed and well-nourished. No distress.  HENT:   Head: Normocephalic.   Nose: Nose normal.   Mouth/Throat: No oropharyngeal exudate.  Eyes: Conjunctivae and EOM are normal. Pupils are equal, round, and reactive to light. Right eye exhibits no discharge. Left eye exhibits no discharge. No scleral icterus.  Neck: Normal range of motion. Neck supple. No JVD present. No tracheal deviation present. No thyromegaly present.  Cardiovascular: Normal rate, regular rhythm, normal heart sounds and intact distal pulses.    No murmur heard. Pulmonary/Chest: Effort normal and breath sounds normal. No stridor. No respiratory distress. He has no wheezes. He has no rales. He exhibits no tenderness.  Abdominal: Soft. Bowel sounds are normal. He exhibits no distension and no mass. There is no tenderness. There is no rebound and no guarding.       Abdomen is soft and nontender. Well-healed midline scar starting several centimeters below the xiphoid and going down to the suprapubic area. Drain site right flank is clean and dry and there is minimal drainage in the bag.  Musculoskeletal: Normal range of motion. He exhibits no edema and no tenderness.  Lymphadenopathy:    He has no cervical adenopathy.  Neurological: He is alert and oriented to person, place, and time. He has normal reflexes. Coordination normal.  Skin: Skin is warm and dry. No rash noted.  He is not diaphoretic. No erythema. No pallor.  Psychiatric: He has a normal mood and affect. His behavior is normal. Judgment and thought content normal.    Data Reviewed I have reviewed his cholecystostomy tube x-ray study. I reviewed Dr. Dorris Fetch cardiac clearance. I reviewed the records from his hospitalization 2 months ago.   Assessment   Acute and chronic cholecystitis, status post percutaneous cholecystostomy tube 01/26/2011.  History subtotal colectomy for ongoing lower GI bleeding from diverticulitis, 01/24/2010.    no apparent postop complications. Nutritional status good.  Coronary disease, status post myocardial infarction, status post cardiac catheter, stable without  symptoms  Chronic kidney disease  Hypertension.    Plan Scheduled for laparoscopic cholecystectomy with cholangiogram, possible open.  Discontinue aspirin 3 days preop  I told him that there was increased with that we would convert him to an open operation because of adhesions. I told him that we would remove his right flank drain at the time of surgery but he may or may not require a subhepatic drain at the time of surgery.  I discussed the risks and indications and details of surgery with him. Complications have been outlined, including but not limited to bleeding, infection, conversion to open laparotomy, bile leak, injury to adjacent organs such as the main bile duct or intestine with major  reconstructive surgery, wound healing problems, cardiac pulmonary and thromboembolic problems. He understands his issues. His questions were answered. He agrees with the plan.       Edsel Petrin. Dalbert Batman, M.D., [facility] General and Minimally invasive Surgery Breast and Colorectal Surgery Office:   [phone] Pager:   [phone]

## 2011-04-12 ENCOUNTER — Encounter (HOSPITAL_COMMUNITY): Payer: Self-pay | Admitting: Anesthesiology

## 2011-04-12 ENCOUNTER — Encounter (HOSPITAL_COMMUNITY)
Admission: RE | Disposition: A | Discharge status: Home / Self Care | Payer: Self-pay | Source: Ambulatory Visit | Attending: General Surgery

## 2011-04-12 ENCOUNTER — Encounter (HOSPITAL_COMMUNITY): Payer: Self-pay | Admitting: *Deleted

## 2011-04-12 ENCOUNTER — Inpatient Hospital Stay (HOSPITAL_COMMUNITY)
Admission: RE | Admit: 2011-04-12 | Discharge: 2011-04-18 | DRG: 415 | Disposition: A | Discharge status: Home / Self Care | Payer: Medicare Other | Source: Ambulatory Visit | Attending: General Surgery | Admitting: General Surgery

## 2011-04-12 ENCOUNTER — Ambulatory Visit (HOSPITAL_COMMUNITY): Payer: Medicare Other | Admitting: Anesthesiology

## 2011-04-12 ENCOUNTER — Other Ambulatory Visit: Payer: Self-pay

## 2011-04-12 ENCOUNTER — Ambulatory Visit (HOSPITAL_COMMUNITY): Payer: Medicare Other

## 2011-04-12 DIAGNOSIS — K219 Gastro-esophageal reflux disease without esophagitis: Secondary | ICD-10-CM | POA: Diagnosis present

## 2011-04-12 DIAGNOSIS — I252 Old myocardial infarction: Secondary | ICD-10-CM

## 2011-04-12 DIAGNOSIS — I2510 Atherosclerotic heart disease of native coronary artery without angina pectoris: Secondary | ICD-10-CM | POA: Diagnosis present

## 2011-04-12 DIAGNOSIS — K660 Peritoneal adhesions (postprocedural) (postinfection): Secondary | ICD-10-CM

## 2011-04-12 DIAGNOSIS — K560 Paralytic ileus: Secondary | ICD-10-CM | POA: Diagnosis not present

## 2011-04-12 DIAGNOSIS — K812 Acute cholecystitis with chronic cholecystitis: Secondary | ICD-10-CM | POA: Diagnosis present

## 2011-04-12 DIAGNOSIS — K8000 Calculus of gallbladder with acute cholecystitis without obstruction: Principal | ICD-10-CM | POA: Diagnosis present

## 2011-04-12 DIAGNOSIS — K929 Disease of digestive system, unspecified: Secondary | ICD-10-CM | POA: Diagnosis not present

## 2011-04-12 DIAGNOSIS — K8010 Calculus of gallbladder with chronic cholecystitis without obstruction: Secondary | ICD-10-CM

## 2011-04-12 DIAGNOSIS — Z882 Allergy status to sulfonamides status: Secondary | ICD-10-CM

## 2011-04-12 DIAGNOSIS — Z8719 Personal history of other diseases of the digestive system: Secondary | ICD-10-CM

## 2011-04-12 DIAGNOSIS — Z800 Family history of malignant neoplasm of digestive organs: Secondary | ICD-10-CM

## 2011-04-12 DIAGNOSIS — Z860100 Personal history of colon polyps, unspecified: Secondary | ICD-10-CM

## 2011-04-12 DIAGNOSIS — N189 Chronic kidney disease, unspecified: Secondary | ICD-10-CM | POA: Diagnosis present

## 2011-04-12 DIAGNOSIS — I129 Hypertensive chronic kidney disease with stage 1 through stage 4 chronic kidney disease, or unspecified chronic kidney disease: Secondary | ICD-10-CM | POA: Diagnosis present

## 2011-04-12 DIAGNOSIS — Z888 Allergy status to other drugs, medicaments and biological substances status: Secondary | ICD-10-CM

## 2011-04-12 DIAGNOSIS — Z01812 Encounter for preprocedural laboratory examination: Secondary | ICD-10-CM

## 2011-04-12 DIAGNOSIS — F411 Generalized anxiety disorder: Secondary | ICD-10-CM | POA: Diagnosis present

## 2011-04-12 DIAGNOSIS — Z8249 Family history of ischemic heart disease and other diseases of the circulatory system: Secondary | ICD-10-CM

## 2011-04-12 HISTORY — DX: Atherosclerotic heart disease of native coronary artery without angina pectoris: I25.10

## 2011-04-12 HISTORY — PX: CHOLECYSTECTOMY: SHX55

## 2011-04-12 LAB — CBC
HCT: 28.8 % — ABNORMAL LOW (ref 39.0–52.0)
Hemoglobin: 9.2 g/dL — ABNORMAL LOW (ref 13.0–17.0)
MCH: 28.7 pg (ref 26.0–34.0)
MCHC: 31.9 g/dL (ref 30.0–36.0)
MCV: 89.7 fL (ref 78.0–100.0)
Platelets: 317 10*3/uL (ref 150–400)
RBC: 3.21 MIL/uL — ABNORMAL LOW (ref 4.22–5.81)
RDW: 15.1 % (ref 11.5–15.5)
WBC: 13.7 10*3/uL — ABNORMAL HIGH (ref 4.0–10.5)

## 2011-04-12 LAB — CREATININE, SERUM
Creatinine, Ser: 1.49 mg/dL — ABNORMAL HIGH (ref 0.50–1.35)
GFR calc Af Amer: 51 mL/min — ABNORMAL LOW (ref >90)
GFR calc non Af Amer: 44 mL/min — ABNORMAL LOW (ref >90)

## 2011-04-12 SURGERY — CHOLECYSTECTOMY
Anesthesia: General | Site: Abdomen | Wound class: Clean Contaminated

## 2011-04-12 MED ORDER — HEPARIN SODIUM (PORCINE) 5000 UNIT/ML IJ SOLN
5000.0000 [IU] | Freq: Three times a day (TID) | INTRAMUSCULAR | Status: DC
  Administered 2011-04-13 – 2011-04-18 (×15): 5000 [IU] via SUBCUTANEOUS
  Filled 2011-04-12 (×18): qty 1

## 2011-04-12 MED ORDER — SODIUM CHLORIDE 0.9 % IR SOLN
Status: DC | PRN
  Administered 2011-04-12: 1000 mL

## 2011-04-12 MED ORDER — FUROSEMIDE 10 MG/ML IJ SOLN
20.0000 mg | Freq: Once | INTRAMUSCULAR | Status: AC
  Administered 2011-04-12: 20 mg via INTRAVENOUS

## 2011-04-12 MED ORDER — BUPIVACAINE LIPOSOME 1.3 % IJ SUSP
20.0000 mL | Freq: Once | INTRAMUSCULAR | Status: DC
  Filled 2011-04-12: qty 20

## 2011-04-12 MED ORDER — FLUTICASONE PROPIONATE 50 MCG/ACT NA SUSP
2.0000 | Freq: Every day | NASAL | Status: DC
  Administered 2011-04-13 – 2011-04-18 (×6): 2 via NASAL
  Filled 2011-04-12: qty 16

## 2011-04-12 MED ORDER — NALOXONE HCL 0.4 MG/ML IJ SOLN
INTRAMUSCULAR | Status: DC | PRN
  Administered 2011-04-12: 80 ug via INTRAVENOUS

## 2011-04-12 MED ORDER — ONDANSETRON HCL 4 MG/2ML IJ SOLN
4.0000 mg | Freq: Four times a day (QID) | INTRAMUSCULAR | Status: DC | PRN
  Administered 2011-04-13: 4 mg via INTRAVENOUS
  Filled 2011-04-12: qty 2

## 2011-04-12 MED ORDER — ONDANSETRON HCL 4 MG/2ML IJ SOLN
INTRAMUSCULAR | Status: DC | PRN
  Administered 2011-04-12: 4 mg via INTRAVENOUS

## 2011-04-12 MED ORDER — HEMOSTATIC AGENTS (NO CHARGE) OPTIME
TOPICAL | Status: DC | PRN
  Administered 2011-04-12: 1

## 2011-04-12 MED ORDER — ALBUMIN HUMAN 5 % IV SOLN
INTRAVENOUS | Status: DC | PRN
  Administered 2011-04-12: 13:00:00 via INTRAVENOUS

## 2011-04-12 MED ORDER — EPHEDRINE SULFATE 50 MG/ML IJ SOLN
INTRAMUSCULAR | Status: DC | PRN
  Administered 2011-04-12 (×2): 5 mg via INTRAVENOUS

## 2011-04-12 MED ORDER — SODIUM CHLORIDE 0.9 % IV SOLN
1.0000 g | INTRAVENOUS | Status: DC
  Administered 2011-04-13 – 2011-04-17 (×5): 1 g via INTRAVENOUS
  Filled 2011-04-12 (×6): qty 1

## 2011-04-12 MED ORDER — LACTATED RINGERS IV SOLN: INTRAVENOUS | Status: DC

## 2011-04-12 MED ORDER — HYDROMORPHONE HCL PF 1 MG/ML IJ SOLN
2.0000 mg | INTRAMUSCULAR | Status: DC | PRN
  Administered 2011-04-12 – 2011-04-14 (×7): 2 mg via INTRAVENOUS
  Administered 2011-04-14: 1 mg via INTRAVENOUS
  Administered 2011-04-14 – 2011-04-16 (×7): 2 mg via INTRAVENOUS
  Filled 2011-04-12 (×6): qty 2
  Filled 2011-04-12: qty 1
  Filled 2011-04-12 (×8): qty 2

## 2011-04-12 MED ORDER — BIOTENE DRY MOUTH MT LIQD
15.0000 mL | Freq: Two times a day (BID) | OROMUCOSAL | Status: DC
  Administered 2011-04-12 – 2011-04-17 (×10): 15 mL via OROMUCOSAL

## 2011-04-12 MED ORDER — NEOSTIGMINE METHYLSULFATE 1 MG/ML IJ SOLN
INTRAMUSCULAR | Status: DC | PRN
  Administered 2011-04-12: 2 mg via INTRAVENOUS
  Administered 2011-04-12: 5 mg via INTRAVENOUS

## 2011-04-12 MED ORDER — NITROGLYCERIN 0.4 MG SL SUBL: 0.4000 mg | SUBLINGUAL_TABLET | SUBLINGUAL | Status: DC | PRN

## 2011-04-12 MED ORDER — ASPIRIN EC 81 MG PO TBEC
81.0000 mg | DELAYED_RELEASE_TABLET | Freq: Every day | ORAL | Status: DC
  Administered 2011-04-12 – 2011-04-14 (×3): 81 mg via ORAL
  Filled 2011-04-12 (×3): qty 1

## 2011-04-12 MED ORDER — PANTOPRAZOLE SODIUM 40 MG PO TBEC
40.0000 mg | DELAYED_RELEASE_TABLET | Freq: Every day | ORAL | Status: DC
  Administered 2011-04-12 – 2011-04-13 (×2): 40 mg via ORAL
  Filled 2011-04-12 (×2): qty 1

## 2011-04-12 MED ORDER — HYDROMORPHONE HCL PF 1 MG/ML IJ SOLN
INTRAMUSCULAR | Status: AC
  Filled 2011-04-12: qty 1

## 2011-04-12 MED ORDER — TAMSULOSIN HCL 0.4 MG PO CAPS
0.4000 mg | ORAL_CAPSULE | Freq: Every day | ORAL | Status: DC
  Administered 2011-04-12: 0.4 mg via ORAL
  Filled 2011-04-12 (×2): qty 1

## 2011-04-12 MED ORDER — ONDANSETRON HCL 4 MG PO TABS: 4.0000 mg | ORAL_TABLET | Freq: Four times a day (QID) | ORAL | Status: DC | PRN

## 2011-04-12 MED ORDER — HYDROMORPHONE HCL PF 1 MG/ML IJ SOLN
0.2500 mg | INTRAMUSCULAR | Status: DC | PRN
  Administered 2011-04-12 (×4): 0.25 mg via INTRAVENOUS

## 2011-04-12 MED ORDER — LACTATED RINGERS IV SOLN
INTRAVENOUS | Status: DC | PRN
  Administered 2011-04-12 (×3): via INTRAVENOUS

## 2011-04-12 MED ORDER — NEBIVOLOL HCL 5 MG PO TABS
5.0000 mg | ORAL_TABLET | Freq: Every day | ORAL | Status: DC
  Administered 2011-04-13 – 2011-04-18 (×6): 5 mg via ORAL
  Filled 2011-04-12 (×7): qty 1

## 2011-04-12 MED ORDER — HETASTARCH-ELECTROLYTES 6 % IV SOLN
INTRAVENOUS | Status: DC | PRN
  Administered 2011-04-12: 11:00:00 via INTRAVENOUS

## 2011-04-12 MED ORDER — DOXAZOSIN MESYLATE 8 MG PO TABS
8.0000 mg | ORAL_TABLET | Freq: Every day | ORAL | Status: DC
  Administered 2011-04-12 – 2011-04-17 (×6): 8 mg via ORAL
  Filled 2011-04-12 (×7): qty 1

## 2011-04-12 MED ORDER — TESTOSTERONE 50 MG/5GM (1%) TD GEL
5.0000 g | Freq: Every day | TRANSDERMAL | Status: DC
  Administered 2011-04-13 – 2011-04-17 (×5): 5 g via TRANSDERMAL
  Filled 2011-04-12 (×7): qty 5

## 2011-04-12 MED ORDER — PHENYLEPHRINE HCL 10 MG/ML IJ SOLN
10.0000 mg | INTRAVENOUS | Status: DC | PRN
  Administered 2011-04-12: 10 ug/min via INTRAVENOUS

## 2011-04-12 MED ORDER — ACETAMINOPHEN 10 MG/ML IV SOLN
INTRAVENOUS | Status: DC | PRN
  Administered 2011-04-12: 1000 mg via INTRAVENOUS

## 2011-04-12 MED ORDER — BUPIVACAINE-EPINEPHRINE 0.25% -1:200000 IJ SOLN
INTRAMUSCULAR | Status: DC | PRN
  Administered 2011-04-12: 1 mL

## 2011-04-12 MED ORDER — MIDAZOLAM HCL 5 MG/5ML IJ SOLN
INTRAMUSCULAR | Status: DC | PRN
  Administered 2011-04-12: 1 mg via INTRAVENOUS

## 2011-04-12 MED ORDER — FENTANYL CITRATE 0.05 MG/ML IJ SOLN
INTRAMUSCULAR | Status: DC | PRN
  Administered 2011-04-12: 50 ug via INTRAVENOUS
  Administered 2011-04-12: 100 ug via INTRAVENOUS
  Administered 2011-04-12: 50 ug via INTRAVENOUS
  Administered 2011-04-12: 100 ug via INTRAVENOUS

## 2011-04-12 MED ORDER — PROPOFOL 10 MG/ML IV EMUL
INTRAVENOUS | Status: DC | PRN
  Administered 2011-04-12: 100 mg via INTRAVENOUS

## 2011-04-12 MED ORDER — GLYCOPYRROLATE 0.2 MG/ML IJ SOLN
INTRAMUSCULAR | Status: DC | PRN
  Administered 2011-04-12: .2 mg via INTRAVENOUS
  Administered 2011-04-12: .8 mg via INTRAVENOUS

## 2011-04-12 MED ORDER — LIDOCAINE HCL (CARDIAC) 20 MG/ML IV SOLN
INTRAVENOUS | Status: DC | PRN
  Administered 2011-04-12: 40 mg via INTRAVENOUS

## 2011-04-12 MED ORDER — ASPIRIN 81 MG PO TBEC: 81.0000 mg | DELAYED_RELEASE_TABLET | Freq: Every day | ORAL | Status: DC

## 2011-04-12 MED ORDER — 0.9 % SODIUM CHLORIDE (POUR BTL) OPTIME
TOPICAL | Status: DC | PRN
  Administered 2011-04-12: 1000 mL

## 2011-04-12 MED ORDER — BUPIVACAINE LIPOSOME 1.3 % IJ SUSP
INTRAMUSCULAR | Status: DC | PRN
  Administered 2011-04-12: 27 mL

## 2011-04-12 MED ORDER — ROCURONIUM BROMIDE 100 MG/10ML IV SOLN
INTRAVENOUS | Status: DC | PRN
  Administered 2011-04-12: 50 mg via INTRAVENOUS
  Administered 2011-04-12 (×2): 20 mg via INTRAVENOUS
  Administered 2011-04-12: 10 mg via INTRAVENOUS

## 2011-04-12 MED ORDER — POTASSIUM CHLORIDE IN NACL 20-0.9 MEQ/L-% IV SOLN
INTRAVENOUS | Status: DC
  Administered 2011-04-12: 1000 mL via INTRAVENOUS
  Administered 2011-04-13 (×2): via INTRAVENOUS
  Filled 2011-04-12 (×4): qty 1000

## 2011-04-12 MED ORDER — GABAPENTIN 400 MG PO CAPS
400.0000 mg | ORAL_CAPSULE | Freq: Three times a day (TID) | ORAL | Status: DC
  Administered 2011-04-12 – 2011-04-14 (×5): 400 mg via ORAL
  Filled 2011-04-12 (×7): qty 1

## 2011-04-12 MED ORDER — IOHEXOL 300 MG/ML  SOLN
INTRAMUSCULAR | Status: DC | PRN
  Administered 2011-04-12: 1 mL via INTRAVENOUS

## 2011-04-12 MED ORDER — PHENYLEPHRINE HCL 10 MG/ML IJ SOLN
INTRAMUSCULAR | Status: DC | PRN
  Administered 2011-04-12: 160 ug via INTRAVENOUS
  Administered 2011-04-12 (×3): 80 ug via INTRAVENOUS
  Administered 2011-04-12: 160 ug via INTRAVENOUS

## 2011-04-12 MED ORDER — CITALOPRAM HYDROBROMIDE 20 MG PO TABS
20.0000 mg | ORAL_TABLET | Freq: Every day | ORAL | Status: DC
  Administered 2011-04-12 – 2011-04-14 (×3): 20 mg via ORAL
  Filled 2011-04-12 (×3): qty 1

## 2011-04-12 MED ORDER — FUROSEMIDE 20 MG PO TABS
20.0000 mg | ORAL_TABLET | Freq: Every day | ORAL | Status: DC
  Administered 2011-04-13 – 2011-04-14 (×2): 20 mg via ORAL
  Filled 2011-04-12 (×3): qty 1

## 2011-04-12 MED ORDER — ACETAMINOPHEN 10 MG/ML IV SOLN
INTRAVENOUS | Status: AC
  Filled 2011-04-12: qty 100

## 2011-04-12 MED ORDER — LISINOPRIL 5 MG PO TABS
5.0000 mg | ORAL_TABLET | Freq: Every day | ORAL | Status: DC
  Administered 2011-04-12 – 2011-04-13 (×2): 5 mg via ORAL
  Filled 2011-04-12 (×2): qty 1

## 2011-04-12 SURGICAL SUPPLY — 75 items
APPLIER CLIP ROT 10 11.4 M/L (STAPLE) ×3
BLADE SURG 10 STRL SS (BLADE) ×3 IMPLANT
BLADE SURG ROTATE 9660 (MISCELLANEOUS) ×3 IMPLANT
CANISTER SUCTION 2500CC (MISCELLANEOUS) ×3 IMPLANT
CHLORAPREP W/TINT 26ML (MISCELLANEOUS) ×3 IMPLANT
CLIP APPLIE ROT 10 11.4 M/L (STAPLE) ×2 IMPLANT
CLOTH BEACON ORANGE TIMEOUT ST (SAFETY) ×3 IMPLANT
COVER MAYO STAND STRL (DRAPES) ×3 IMPLANT
COVER SURGICAL LIGHT HANDLE (MISCELLANEOUS) ×3 IMPLANT
DECANTER SPIKE VIAL GLASS SM (MISCELLANEOUS) ×6 IMPLANT
DERMABOND ADVANCED (GAUZE/BANDAGES/DRESSINGS)
DERMABOND ADVANCED .7 DNX12 (GAUZE/BANDAGES/DRESSINGS) IMPLANT
DRAIN CHANNEL 19F RND (DRAIN) ×3 IMPLANT
DRAPE C-ARM 42X72 X-RAY (DRAPES) ×3 IMPLANT
DRAPE LAPAROSCOPIC ABDOMINAL (DRAPES) ×3 IMPLANT
DRAPE UTILITY 15X26 W/TAPE STR (DRAPE) IMPLANT
DRAPE WARM FLUID 44X44 (DRAPE) IMPLANT
ELECT BLADE 4.0 EZ CLEAN MEGAD (MISCELLANEOUS) ×3
ELECT BLADE 6.5 EXT (BLADE) ×3 IMPLANT
ELECT CAUTERY BLADE 6.4 (BLADE) ×6 IMPLANT
ELECT REM PT RETURN 9FT ADLT (ELECTROSURGICAL) ×3
ELECTRODE BLDE 4.0 EZ CLN MEGD (MISCELLANEOUS) ×2 IMPLANT
ELECTRODE REM PT RTRN 9FT ADLT (ELECTROSURGICAL) ×2 IMPLANT
EVACUATOR SILICONE 100CC (DRAIN) ×3 IMPLANT
GAUZE SPONGE 4X4 12PLY STRL LF (GAUZE/BANDAGES/DRESSINGS) ×3 IMPLANT
GLOVE BIO SURGEON STRL SZ7.5 (GLOVE) ×3 IMPLANT
GLOVE BIOGEL PI IND STRL 7.0 (GLOVE) ×8 IMPLANT
GLOVE BIOGEL PI INDICATOR 7.0 (GLOVE) ×4
GLOVE ECLIPSE 6.5 STRL STRAW (GLOVE) ×3 IMPLANT
GLOVE EUDERMIC 7 POWDERFREE (GLOVE) ×3 IMPLANT
GLOVE SURG ORTHO 8.0 STRL STRW (GLOVE) ×3 IMPLANT
GLOVE SURG SS PI 6.5 STRL IVOR (GLOVE) ×6 IMPLANT
GOWN PREVENTION PLUS XLARGE (GOWN DISPOSABLE) ×3 IMPLANT
GOWN STRL NON-REIN LRG LVL3 (GOWN DISPOSABLE) ×12 IMPLANT
HEMOSTAT SNOW SURGICEL 2X4 (HEMOSTASIS) ×3 IMPLANT
KIT BASIN OR (CUSTOM PROCEDURE TRAY) ×3 IMPLANT
KIT ROOM TURNOVER OR (KITS) ×3 IMPLANT
NEEDLE HYPO 25GX1X1/2 BEV (NEEDLE) ×3 IMPLANT
NS IRRIG 1000ML POUR BTL (IV SOLUTION) ×6 IMPLANT
PACK GENERAL/GYN (CUSTOM PROCEDURE TRAY) ×3 IMPLANT
PAD ARMBOARD 7.5X6 YLW CONV (MISCELLANEOUS) ×3 IMPLANT
PENCIL BUTTON HOLSTER BLD 10FT (ELECTRODE) ×3 IMPLANT
POUCH SPECIMEN RETRIEVAL 10MM (ENDOMECHANICALS) ×3 IMPLANT
SCALPEL HARMONIC ACE (MISCELLANEOUS) ×3 IMPLANT
SCISSORS LAP 5X35 DISP (ENDOMECHANICALS) ×3 IMPLANT
SET CHOLANGIOGRAPH 5 50 .035 (SET/KITS/TRAYS/PACK) ×3 IMPLANT
SET IRRIG TUBING LAPAROSCOPIC (IRRIGATION / IRRIGATOR) ×3 IMPLANT
SLEEVE ENDOPATH XCEL 5M (ENDOMECHANICALS) ×6 IMPLANT
SPECIMEN JAR SMALL (MISCELLANEOUS) ×3 IMPLANT
SPONGE GAUZE 4X4 12PLY (GAUZE/BANDAGES/DRESSINGS) ×3 IMPLANT
SPONGE LAP 18X18 X RAY DECT (DISPOSABLE) ×6 IMPLANT
STAPLER VISISTAT 35W (STAPLE) ×3 IMPLANT
SUCTION POOLE TIP (SUCTIONS) IMPLANT
SUT ETHILON 3 0 FSL (SUTURE) ×3 IMPLANT
SUT MNCRL AB 4-0 PS2 18 (SUTURE) ×3 IMPLANT
SUT NOVA 1 T20/GS 25DT (SUTURE) IMPLANT
SUT PDS AB 1 CTX 36 (SUTURE) ×6 IMPLANT
SUT SILK 2 0 SH CR/8 (SUTURE) ×3 IMPLANT
SUT SILK 2 0 TIES 10X30 (SUTURE) IMPLANT
SUT SILK 3 0 SH CR/8 (SUTURE) ×3 IMPLANT
SUT SILK 3 0 TIES 10X30 (SUTURE) IMPLANT
SUT VIC AB 2-0 SH 27 (SUTURE) ×1
SUT VIC AB 2-0 SH 27X BRD (SUTURE) ×2 IMPLANT
SUT VIC AB 3-0 SH 18 (SUTURE) IMPLANT
SUT VICRYL AB 2 0 TIES (SUTURE) ×3 IMPLANT
SYR CONTROL 10ML LL (SYRINGE) ×3 IMPLANT
TAPE CLOTH SURG 6X10 WHT LF (GAUZE/BANDAGES/DRESSINGS) ×3 IMPLANT
TOWEL OR 17X24 6PK STRL BLUE (TOWEL DISPOSABLE) ×3 IMPLANT
TOWEL OR 17X26 10 PK STRL BLUE (TOWEL DISPOSABLE) ×3 IMPLANT
TRAY LAPAROSCOPIC (CUSTOM PROCEDURE TRAY) ×3 IMPLANT
TROCAR XCEL BLUNT TIP 100MML (ENDOMECHANICALS) ×3 IMPLANT
TROCAR XCEL NON-BLD 11X100MML (ENDOMECHANICALS) ×6 IMPLANT
TROCAR XCEL NON-BLD 5MMX100MML (ENDOMECHANICALS) ×3 IMPLANT
TUBE CONNECTING 12X1/4 (SUCTIONS) ×3 IMPLANT
YANKAUER SUCT BULB TIP NO VENT (SUCTIONS) ×3 IMPLANT

## 2011-04-12 NOTE — Progress Notes (Signed)
Dr Glennon Mac at bedside and aware of pcxr results.  States to leave pt on FM and encourage pt cough and deep breath.

## 2011-04-12 NOTE — Op Note (Signed)
Patient Name:           Gary [name]   Date of Surgery:        04/12/2011  Pre op Diagnosis:      Acute and chronic cholecystitis with cholelithiasis, status post percutaneous cholecystostomy drainage.  Post op Diagnosis:    same  Procedure:                 Laparoscopic lysis of adhesions, open cholecystectomy  Surgeon:                     Edsel Petrin. Dalbert Batman, M.D., FACS  Assistant:                      Eddie Dibbles, M.D., [facility]  Operative Indications:   This is a 78 year old man with moderately severe cardiovascular disease and chronic renal insufficiency.  He has a history of a subtotal colectomy in December of 2011 because of persistent lower GI bleeding from  diverticulosis. He was hospitalized in December, 2012 with nausea vomiting diarrhea abdominal pain. He had severe acute cholecystitis. He had cardiovascular instability and a decision was made to proceed with a percutaneous cholecystostomy tube and he did recover. He was referred back to me. Injection study of his drain showed obstruction of the cystic duct. LFT's are normal.He has undergone cardiac evaluation and cardiac clearance by Dr. Aldona Bar. He is brought to the operating room for cholecystectomy.  Operative Findings:       There were dense adhesions of multiple loops of small bowel up along the posterior dome of the liver just above the insertion site of the drain. We did not identify any injury to the small bowel, however. The gallbladder was chronically inflamed, thick walled, very firm and fibrotic, and partially intrahepatic. There were lots of adhesions of omentum and small bowel to the anterior abdominal wall. The cystic duct was occluded and a cholangiogram was not possible.  Procedure in Detail:          Following the induction of general endotracheal anesthesia intravenous antibiotics were given. Surgical time out was performed. The abdomen and right chest wall were prepped and draped in a sterile fashion. He was prepped so as to  expose the right flank drain. 0.5% Marcaine with epinephrine was used as a local infiltration anesthetic at the beginning of the case, and at the end of the case injected about 20 cc of exparel all of which was mixed one to one with sterile saline for injection.  A 5 mm optical trocar was placed in the left subcostal region. There were some thin adhesions in this area but we could see around the midline adhesions and the falciform ligament into the right upper quadrant. We placed an 11 mm trocar in the subxiphoid region and Two 5 mm trocars in the right upper quadrant laterally. With a variety of cameras and trocar sites we took  all of the adhesions from the anterior abdominal wall.  We could see several loops of small bowel tethered up to the right hemidiaphragm in the dome of the liver. With sharp scissors we took a lot of these adhesions down but it became very dense posteriorly over the liver and we felt uncomfortable and could not see well enough to safely continue the dissection. To avoid any injury to the small bowel we elected to convert to an open operation.  The pneumoperitoneum was released and the trocars were removed. A right subcostal incision  was made incorporating all of the trocar sites   in the right upper quadrant. The fascia was incised transversely with electrocautery and the abdomen entered. Self-retaining retractor was placed. With good exposure and visualization I did take down all of the adhesions of the small bowel from the right hemidiaphragm and the posterior dome of the liver. Once I brought these loops of small bowel down they were carefully inspected and both Dr. Marlou Starks and I felt there had been no injury  of the small bowel. We could then feel the drain coming in to the liver. We pulled the drain out the right flank. There was no bleeding from the liver. I further mobilized the liver off the diaphragm and placed packs behind the liver.  With the self-retaining retractor holding  the stomach and small bowel out of the way we identified the gallbladder. With sharp dissection, blunt dissection and  electrocautery I slowly dissected the very hard fibrotic gallbladder out of its bed. I did this from the fundus downward. Cystic artery was isolated and secured with metal clips and divided. We opened the gallbladder bed, but could not pass a small hemostat through the cystic duct. The cystic duct appeared to be completely occluded. We placed a clamp on the cystic duct, amputated the gallbladder and passed it off the field to pathology. The cystic duct was suture ligated with a 2-0 Vicryl suture ligature and a 2-0 Vicryl ties.  Hemostasis was good in the bed of the liver. The case had been somewhat bloody and we lost over 300 cc of blood but at the end of the case it was completely dry. We irrigated out the subhepatic and subphrenic spaces quite extensively. We placed a 60 [place] Blake drain in subhepatic space and brought it out through the right flank, sutured it  to the skin with a nylon suture and connected to a suction bulb. We irrigated out one more time. We ran the small bowel again and saw  no evidence of any injury or leak. Everything was returned to its anatomic position. The posterior rectus sheath and internal oblique and transversus abdominis were closed with running suture of #1 PDS. The anterior rectus sheath and the external oblique were closed with a running suture of #1 PDS. I closed the small fascia trocar site above the umbilicus with a 0 Vicryl suture. After irrigating the incisions all the skin incisions were closed loosely with staples.  I placed Telfa wicks in between. Bandages were placed and the patient taken to recovery in stable condition. EBL 350 cc. Complications none. Counts correct.     Edsel Petrin. Dalbert Batman, M.D., FACS General and Minimally Invasive Surgery Breast and Colorectal Surgery  04/12/2011 12:45 PM

## 2011-04-12 NOTE — Transfer of Care (Signed)
Immediate Anesthesia Transfer of Care Note  Patient: Gary [name]  Procedure(s) Performed: Procedure(s) (LRB): CHOLECYSTECTOMY (N/A) LAPAROSCOPIC CHOLECYSTECTOMY (N/A)  Patient Location: PACU  Anesthesia Type: General  Level of Consciousness: awake, sedated, patient cooperative and responds to stimulation, moved all extemities , denies  Any pain  Airway & Oxygen Therapy: Patient Spontanous Breathing and Patient connected to face mask oxygen  Post-op Assessment: Report given to PACU RN and Post -op Vital signs reviewed and stable  Post vital signs: stable  Complications: No apparent anesthesia complications

## 2011-04-12 NOTE — Progress Notes (Signed)
Dr Dalbert Batman and Dr Glennon Mac at bedside and aware pt has difficulty breathing. Orders received.

## 2011-04-12 NOTE — Progress Notes (Signed)
Dr Dalbert Batman at bedside.  He is aware of pcxr results. He will consult with Dr Glennon Mac

## 2011-04-12 NOTE — Progress Notes (Signed)
Pt transferred at 1730hrs from PACU.  Alert and oriented on arrival, given dilaudid by PACU RN at transfer.  Pt drsg to abdomen with old drainage marked, but no further bleeding noted.  JP intact and charged.  Pt arrived with foley draining clear yellow urine, placed in PACU.  Pt oriented to unit and plan of care for shift.  Wife updated at bedside.

## 2011-04-12 NOTE — Interval H&P Note (Signed)
History and Physical Interval Note:  04/12/2011 9:48 AM  Gary [name]  has presented today for surgery, with the diagnosis of GALLSTONES   The goals of treatment and the  various methods of treatment have been discussed with the patient and family. After consideration of risks, benefits and other options for treatment, the patient has consented to  Procedure(s) (LRB): LAPAROSCOPIC CHOLECYSTECTOMY WITH INTRAOPERATIVE CHOLANGIOGRAM (N/A), POSSIBLE OPEN  CHOLECYSTECTOMY (N/A) as a surgical intervention .  The patients' history has been reviewed, patient examined, no change in status, stable for surgery.  I have reviewed the patients' chart and labs.  Questions were answered to the patient's satisfaction.     Adin Hector

## 2011-04-12 NOTE — Anesthesia Preprocedure Evaluation (Addendum)
Anesthesia Evaluation  Patient identified by MRN, date of birth, ID band Patient awake    Reviewed: Allergy & Precautions, H&P , NPO status , Patient's Chart, lab work & pertinent test results, reviewed documented beta blocker date and time   History of Anesthesia Complications Negative for: history of anesthetic complications  Airway Mallampati: III TM Distance: >3 FB Neck ROM: Full    Dental No notable dental hx. (+) Teeth Intact, Chipped and Dental Advisory Given   Pulmonary neg pulmonary ROS,  clear to auscultation        Cardiovascular hypertension, Pt. on medications and Pt. on home beta blockers + angina with exertion + CAD (cath 6/11: normal LVF, EF 60%, LAD 60%, Cx 100%, but fills with collateral flow) Regular Normal Stress test 2 weeks ago (OK as per patient), EF 67%, low risk, no ischemia   Neuro/Psych Anxiety Negative Neurological ROS     GI/Hepatic Neg liver ROS, GERD-  Medicated and Controlled,  Endo/Other  Negative Endocrine ROS  Renal/GU negative Renal ROS     Musculoskeletal   Abdominal (+) obese,   Peds  Hematology negative hematology ROS (+)   Anesthesia Other Findings   Reproductive/Obstetrics                          Anesthesia Physical Anesthesia Plan  ASA: III  Anesthesia Plan: General   Post-op Pain Management:    Induction: Intravenous  Airway Management Planned: Oral ETT  Additional Equipment:   Intra-op Plan:   Post-operative Plan: Extubation in OR  Informed Consent: I have reviewed the patients History and Physical, chart, labs and discussed the procedure including the risks, benefits and alternatives for the proposed anesthesia with the patient or authorized representative who has indicated his/her understanding and acceptance.   Dental advisory given  Plan Discussed with: CRNA and Surgeon  Anesthesia Plan Comments: (Plan routine monitors, GETA)         Anesthesia Quick Evaluation

## 2011-04-12 NOTE — Anesthesia Postprocedure Evaluation (Signed)
Anesthesia Post Note  Patient: Gary [name]  Procedure(s) Performed: Procedure(s) (LRB): CHOLECYSTECTOMY (N/A) LAPAROSCOPIC CHOLECYSTECTOMY (N/A)  Anesthesia type: general  Patient location: PACU  Post pain: Pain level controlled  Post assessment: Patient's Cardiovascular Status Stable  Last Vitals:  Filed Vitals:   04/12/11 1620  BP:   Pulse: 62  Temp:   Resp: 17    Post vital signs: Reviewed and stable  Level of consciousness: sedated  Complications: No apparent anesthesia complications

## 2011-04-13 ENCOUNTER — Inpatient Hospital Stay (HOSPITAL_COMMUNITY): Payer: Medicare Other

## 2011-04-13 LAB — COMPREHENSIVE METABOLIC PANEL
AST: 33 U/L (ref 0–37)
AST: 25 U/L (ref 0–37)
Albumin: 2.9 g/dL — ABNORMAL LOW (ref 3.5–5.2)
BUN: 31 mg/dL — ABNORMAL HIGH (ref 6–23)
CO2: 24 mEq/L (ref 19–32)
Calcium: 8.8 mg/dL (ref 8.4–10.5)
Chloride: 107 mEq/L (ref 96–112)
Chloride: 103 mEq/L (ref 96–112)
Creatinine, Ser: 1.7 mg/dL — ABNORMAL HIGH (ref 0.50–1.35)
Creatinine, Ser: 1.78 mg/dL — ABNORMAL HIGH (ref 0.50–1.35)
GFR calc non Af Amer: 37 mL/min — ABNORMAL LOW (ref >90)
Glucose, Bld: 119 mg/dL — ABNORMAL HIGH (ref 70–99)
Total Bilirubin: 0.5 mg/dL (ref 0.3–1.2)

## 2011-04-13 LAB — POTASSIUM: Potassium: 6 mEq/L — ABNORMAL HIGH (ref 3.5–5.1)

## 2011-04-13 LAB — BASIC METABOLIC PANEL
CO2: 22 mEq/L (ref 19–32)
Chloride: 108 mEq/L (ref 96–112)
GFR calc Af Amer: 42 mL/min — ABNORMAL LOW (ref >90)
Potassium: 5.6 mEq/L — ABNORMAL HIGH (ref 3.5–5.1)

## 2011-04-13 LAB — CBC
MCH: 28.8 pg (ref 26.0–34.0)
Platelets: 330 10*3/uL (ref 150–400)
RBC: 3.06 MIL/uL — ABNORMAL LOW (ref 4.22–5.81)

## 2011-04-13 MED ORDER — ALBUTEROL SULFATE (5 MG/ML) 0.5% IN NEBU
2.5000 mg | INHALATION_SOLUTION | Freq: Once | RESPIRATORY_TRACT | Status: AC
  Administered 2011-04-13: 2.5 mg via RESPIRATORY_TRACT
  Filled 2011-04-13: qty 0.5

## 2011-04-13 MED ORDER — SODIUM CHLORIDE 0.9 % IV BOLUS (SEPSIS)
1000.0000 mL | Freq: Once | INTRAVENOUS | Status: AC
  Administered 2011-04-13: 1000 mL via INTRAVENOUS

## 2011-04-13 MED ORDER — SODIUM CHLORIDE 0.9 % IV SOLN
INTRAVENOUS | Status: DC
  Administered 2011-04-13: 125 mL/h via INTRAVENOUS
  Administered 2011-04-13: 08:00:00 via INTRAVENOUS

## 2011-04-13 MED ORDER — SODIUM POLYSTYRENE SULFONATE 15 GM/60ML PO SUSP
30.0000 g | Freq: Once | ORAL | Status: AC
  Administered 2011-04-13: 30 g via ORAL
  Filled 2011-04-13: qty 120

## 2011-04-13 NOTE — Progress Notes (Signed)
eLink Physician-Brief Progress Note Patient Name: BHAVIK HAUPTMANN DOB: Sep 22, 1932 MRN: [id number]  Date of Service  04/13/2011   HPI/Events of Note   Lab 04/13/11 1413 04/13/11 0500 04/09/11 0935  K 6.0* 6.1* 4.2    Lab 04/13/11 0500 04/12/11 2059 04/09/11 0935  CREATININE 1.70* 1.49* 1.58*    estimated creatinine clearance is 40.3 ml/min (by C-G formula based on Cr of 1.7).  Per RN he is taking clears but has vomitted x 2 K contatining fluids was stopped several hours ago but K still hight Creat trending up Monitor does not show qtc prolongation  eICU Interventions  1. Will attempt kayexalate 2. Check lactate and cmp   Intervention Category Intermediate Interventions: Electrolyte abnormality - evaluation and management  Jaysten Essner 04/13/2011, 4:14 PM

## 2011-04-13 NOTE — Progress Notes (Signed)
Patient ID: Gary [name], male   DOB: [birth year], 78 y.o.   MRN: [id number]  General Surgery - [facility] - Progress Note  POD# 1   Subjective: Patient without complaints.  "sore"  Taking po clear liquids this AM.  Wife at bedside.  Objective: Vital signs in last 24 hours: Temp:  [97.2 F (36.2 C)-98.5 F (36.9 C)] 97.9 F (36.6 C) (03/02 0826) Pulse Rate:  [62-98] 92  (03/02 0826) Resp:  [13-26] 20  (03/02 0826) BP: (89-131)/(40-74) 110/54 mmHg (03/02 0931) SpO2:  [95 %-100 %] 96 % (03/02 0826) Weight:  [193 lb 5.5 oz (87.7 kg)-194 lb 3.6 oz (88.1 kg)] 194 lb 3.6 oz (88.1 kg) (03/02 0100) Last BM Date: 04/12/11  Intake/Output from previous day: 03/01 0701 - 03/02 0700 In: 5055 [P.O.:630; I.V.:3800; IV Piggyback:625] Out: 2207 [Urine:1650; Drains:132; Blood:425]  Exam: HEENT - clear, not icteric Neck - soft Chest - coarse bilaterally Cor - RRR, no murmur Abd - mild distension; dressings with stable serosanguinous staining; no BS present; drain with thin yellowish output Ext - no significant edema Neuro - grossly intact, no focal deficits  Lab Results:   Basename 04/13/11 0500 04/12/11 2059  WBC 13.6* 13.7*  HGB 8.8* 9.2*  HCT 27.5* 28.8*  PLT 330 317     Basename 04/13/11 0500 04/12/11 2059  NA 140 --  K 6.1* --  CL 107 --  CO2 24 --  GLUCOSE 119* --  BUN 31* --  CREATININE 1.70* 1.49*  CALCIUM 8.4 --    Studies/Results: Dg Chest Port 1 View  04/12/2011  *RADIOLOGY REPORT*  Clinical Data: Postop from abdominal surgery. Dyspnea.  Decreased breath sounds on exam.  PORTABLE CHEST - 1 VIEW  Comparison: 04/09/2011  Findings: There is been interval development of left upper and lower lobe atelectasis since previous study, and left pleural effusion cannot be excluded on this portable exam.  Low lung volumes are seen bilaterally, with mild atelectasis noted at the right lung base.  Heart size remains stable in size.  IMPRESSION: Interval  development of left upper and lower lobe atelectasis. Left pleural effusion cannot be excluded on this portable exam.  Original Report Authenticated By: Marlaine Hind, M.D.    Assessment: Status post open cholecystectomy, lysis of adhesions CAD Respiratory compromise after general anesthesia  Plan: Continue to monitor cardiac and pulmonary status in stepdown unit today Clear liquid diet OOB to chair today Monitor drain output Pain control  Earnstine Regal, MD, [facility] Office: [phone]  04/13/2011

## 2011-04-13 NOTE — Progress Notes (Signed)
eLink Physician-Brief Progress Note Patient Name: Gary [name] DOB: 22-Dec-1932 MRN: [id number]  Date of Service  04/13/2011   HPI/Events of Note    Lab 04/13/11 1640 04/13/11 1413 04/13/11 0500 04/09/11 0935  K 5.8* 6.0* 6.1* 4.2    Lab 04/13/11 1640 04/13/11 0500 04/12/11 2059 04/09/11 0935  CREATININE 1.78* 1.70* 1.49* 1.58*    Normal echo 2012 December Has significant NG output Noted to be oon ACe inhitibor  eICU Interventions  Fluid bolus Dc ace inhibitor (can cause renal failure and hyperkalemia) DC floxmax (has foley) Recheck bmet   Intervention Category Intermediate Interventions: Electrolyte abnormality - evaluation and management  Dayanara Sherrill 04/13/2011, 6:15 PM

## 2011-04-14 LAB — CBC
HCT: 26.8 % — ABNORMAL LOW (ref 39.0–52.0)
Hemoglobin: 8.6 g/dL — ABNORMAL LOW (ref 13.0–17.0)
MCV: 90.5 fL (ref 78.0–100.0)
RBC: 2.96 MIL/uL — ABNORMAL LOW (ref 4.22–5.81)
WBC: 11.2 10*3/uL — ABNORMAL HIGH (ref 4.0–10.5)

## 2011-04-14 LAB — BASIC METABOLIC PANEL
BUN: 39 mg/dL — ABNORMAL HIGH (ref 6–23)
CO2: 20 mEq/L (ref 19–32)
Chloride: 109 mEq/L (ref 96–112)
Creatinine, Ser: 1.61 mg/dL — ABNORMAL HIGH (ref 0.50–1.35)
GFR calc Af Amer: 46 mL/min — ABNORMAL LOW (ref >90)
Glucose, Bld: 128 mg/dL — ABNORMAL HIGH (ref 70–99)
Potassium: 5.2 mEq/L — ABNORMAL HIGH (ref 3.5–5.1)

## 2011-04-14 MED ORDER — DEXTROSE-NACL 5-0.45 % IV SOLN
INTRAVENOUS | Status: DC
  Administered 2011-04-14: 20:00:00 via INTRAVENOUS
  Administered 2011-04-14: 100 mL/h via INTRAVENOUS
  Administered 2011-04-15 – 2011-04-16 (×2): via INTRAVENOUS

## 2011-04-14 MED ORDER — ONDANSETRON HCL 4 MG PO TABS: 4.0000 mg | ORAL_TABLET | Freq: Four times a day (QID) | ORAL | Status: DC | PRN

## 2011-04-14 MED ORDER — ONDANSETRON HCL 4 MG/2ML IJ SOLN
4.0000 mg | INTRAMUSCULAR | Status: DC | PRN
  Administered 2011-04-15: 4 mg via INTRAVENOUS
  Filled 2011-04-14: qty 2

## 2011-04-14 MED ORDER — GABAPENTIN 250 MG/5ML PO SOLN
400.0000 mg | Freq: Three times a day (TID) | ORAL | Status: DC
  Administered 2011-04-14 – 2011-04-15 (×4): 400 mg
  Filled 2011-04-14 (×7): qty 8

## 2011-04-14 MED ORDER — ASPIRIN 81 MG PO CHEW
81.0000 mg | CHEWABLE_TABLET | Freq: Every day | ORAL | Status: DC
  Administered 2011-04-15 – 2011-04-18 (×4): 81 mg
  Filled 2011-04-14 (×4): qty 1

## 2011-04-14 MED ORDER — FUROSEMIDE 8 MG/ML PO SOLN
20.0000 mg | Freq: Every day | ORAL | Status: DC
  Administered 2011-04-15: 20 mg
  Filled 2011-04-14: qty 5

## 2011-04-14 MED ORDER — CITALOPRAM HYDROBROMIDE 10 MG/5ML PO SOLN
20.0000 mg | Freq: Every day | ORAL | Status: DC
  Administered 2011-04-15: 20 mg
  Filled 2011-04-14: qty 10

## 2011-04-14 MED ORDER — PANTOPRAZOLE SODIUM 40 MG PO PACK
40.0000 mg | PACK | Freq: Every day | ORAL | Status: DC
  Administered 2011-04-14 – 2011-04-15 (×2): 40 mg
  Filled 2011-04-14 (×2): qty 20

## 2011-04-14 NOTE — Progress Notes (Signed)
Patient ID: Gary [name], male   DOB: 1932-12-11, 78 y.o.   MRN: [id number]  General Surgery - [facility] - Progress Note  POD# 2  Subjective: Patient in stepdown unit.  Awake and alert.  Family at bedside.  Had nausea and emesis with clear liquids yesterday.  NG tube placed.  Moderate pain.  Has not been OOB yet.  Objective: Vital signs in last 24 hours: Temp:  [97.4 F (36.3 C)-99.1 F (37.3 C)] 99.1 F (37.3 C) (03/03 0500) Pulse Rate:  [88-100] 99  (03/03 0500) Resp:  [13-35] 18  (03/03 0500) BP: (96-128)/(45-72) 104/53 mmHg (03/03 0500) SpO2:  [94 %-100 %] 96 % (03/03 0500) Weight:  [205 lb 0.4 oz (93 kg)] 205 lb 0.4 oz (93 kg) (03/03 0500) Last BM Date: 04/12/11  Intake/Output from previous day: 03/02 0701 - 03/03 0700 In: 2154 [P.O.:480; I.V.:1624; IV Piggyback:50] Out: 1365 [Urine:1200; Emesis/NG output:100; Drains:65]  Exam: HEENT - clear, not icteric; NG right nares Neck - soft Chest - coarse bilaterally Cor - RRR, no murmur Abd - mild distension; BS present; dressing dry and intact; JP with thin serosanguinous Ext - no significant edema Neuro - grossly intact, no focal deficits  Lab Results:   Basename 04/14/11 0451 04/13/11 0500  WBC 11.2* 13.6*  HGB 8.6* 8.8*  HCT 26.8* 27.5*  PLT 299 330     Basename 04/14/11 0451 04/13/11 2025  NA 140 139  K 5.2* 5.6*  CL 109 108  CO2 20 22  GLUCOSE 128* 143*  BUN 39* 38*  CREATININE 1.61* 1.75*  CALCIUM 8.7 8.5    Studies/Results: Dg Chest Port 1 View  04/13/2011  *RADIOLOGY REPORT*  Clinical Data: Atelectasis postop.  PORTABLE CHEST - 1 VIEW  Comparison: 04/12/2011  Findings: Film is very shallow inflation.  Heart is enlarged. There is patchy density throughout the left lung, slightly increased since prior study.  Findings are consistent with atelectasis or developing infiltrate.  There is right basilar atelectasis but no definite consolidation.  IMPRESSION:  1.  Shallow inflation. 2.   Increasing density on the left consistent with atelectasis or infiltrate.  Original Report Authenticated By: Glenice Bow, M.D.   Dg Chest Port 1 View  04/12/2011  *RADIOLOGY REPORT*  Clinical Data: Postop from abdominal surgery. Dyspnea.  Decreased breath sounds on exam.  PORTABLE CHEST - 1 VIEW  Comparison: 04/09/2011  Findings: There is been interval development of left upper and lower lobe atelectasis since previous study, and left pleural effusion cannot be excluded on this portable exam.  Low lung volumes are seen bilaterally, with mild atelectasis noted at the right lung base.  Heart size remains stable in size.  IMPRESSION: Interval development of left upper and lower lobe atelectasis. Left pleural effusion cannot be excluded on this portable exam.  Original Report Authenticated By: Marlaine Hind, M.D.    Assessment: Status post open cholecystectomy, lysis of adhesions  CAD  Respiratory compromise after general anesthesia  Plan: Appreciate fluid / lyte management by Elink NG to suction / NPO except ice chips OOB to chair today if tolerated Drain to bulb suction until eating Dr. Dalbert Batman out of town - Dr. Marlou Starks will follow patient this week Leave in stepdown unit today   Earnstine Regal, MD, [facility] Office: [phone]  04/14/2011

## 2011-04-14 NOTE — Progress Notes (Signed)
Dr. Gershon Crane made aware of pt c/o nausea and dry heaving when OG clamped to give PO meds. Order received to premedicate with zofran 4mg . [place] Cox-Ingram,RN

## 2011-04-15 LAB — CBC
HCT: 24.1 % — ABNORMAL LOW (ref 39.0–52.0)
Hemoglobin: 7.6 g/dL — ABNORMAL LOW (ref 13.0–17.0)
MCH: 28.6 pg (ref 26.0–34.0)
MCHC: 31.5 g/dL (ref 30.0–36.0)
MCV: 90.6 fL (ref 78.0–100.0)

## 2011-04-15 LAB — COMPREHENSIVE METABOLIC PANEL
Alkaline Phosphatase: 64 U/L (ref 39–117)
BUN: 36 mg/dL — ABNORMAL HIGH (ref 6–23)
Creatinine, Ser: 1.52 mg/dL — ABNORMAL HIGH (ref 0.50–1.35)
GFR calc Af Amer: 50 mL/min — ABNORMAL LOW (ref >90)
Glucose, Bld: 143 mg/dL — ABNORMAL HIGH (ref 70–99)
Potassium: 3.7 mEq/L (ref 3.5–5.1)
Total Protein: 5.1 g/dL — ABNORMAL LOW (ref 6.0–8.3)

## 2011-04-15 MED ORDER — CITALOPRAM HYDROBROMIDE 20 MG PO TABS
20.0000 mg | ORAL_TABLET | Freq: Every day | ORAL | Status: DC
  Administered 2011-04-16 – 2011-04-18 (×3): 20 mg via ORAL
  Filled 2011-04-15 (×3): qty 1

## 2011-04-15 MED ORDER — PANTOPRAZOLE SODIUM 40 MG PO TBEC
40.0000 mg | DELAYED_RELEASE_TABLET | Freq: Every day | ORAL | Status: DC
  Administered 2011-04-16 – 2011-04-17 (×2): 40 mg via ORAL
  Filled 2011-04-15 (×2): qty 1

## 2011-04-15 MED ORDER — GABAPENTIN 800 MG PO TABS
400.0000 mg | ORAL_TABLET | Freq: Three times a day (TID) | ORAL | Status: DC
  Filled 2011-04-15 (×2): qty 1

## 2011-04-15 MED ORDER — GABAPENTIN 400 MG PO CAPS
400.0000 mg | ORAL_CAPSULE | Freq: Three times a day (TID) | ORAL | Status: DC
  Administered 2011-04-15 – 2011-04-18 (×9): 400 mg via ORAL
  Filled 2011-04-15 (×11): qty 1

## 2011-04-15 MED ORDER — FUROSEMIDE 20 MG PO TABS
20.0000 mg | ORAL_TABLET | Freq: Every day | ORAL | Status: DC
  Administered 2011-04-16 – 2011-04-18 (×3): 20 mg via ORAL
  Filled 2011-04-15 (×3): qty 1

## 2011-04-15 NOTE — Progress Notes (Signed)
3 Days Post-Op  Subjective: Feels better. Having bm'[name] now  Objective: Vital signs in last 24 hours: Temp:  [98.2 F (36.8 C)-99.8 F (37.7 C)] 98.2 F (36.8 C) (03/04 0744) Pulse Rate:  [73-96] 77  (03/04 0744) Resp:  [15-24] 24  (03/04 0744) BP: (97-116)/(42-66) 107/52 mmHg (03/04 0744) SpO2:  [95 %-100 %] 100 % (03/04 0744) Weight:  [202 lb 13.2 oz (92 kg)] 202 lb 13.2 oz (92 kg) (03/04 0500) Last BM Date: 04/15/11  Intake/Output from previous day: 03/03 0701 - 03/04 0700 In: 1561.7 [I.V.:1471.7; NG/GT:40; IV Piggyback:50] Out: 2215 [Urine:1550; Emesis/NG output:600; Drains:65] Intake/Output this shift: Total I/O In: -  Out: 100 [Urine:100]  GI: soft, mild tenderness. good bs. drain output serosang  Lab Results:   Basename 04/15/11 0510 04/14/11 0451  WBC 8.2 11.2*  HGB 7.6* 8.6*  HCT 24.1* 26.8*  PLT 243 299   BMET  Basename 04/15/11 0510 04/14/11 0451  NA 142 140  K 3.7 5.2*  CL 110 109  CO2 25 20  GLUCOSE 143* 128*  BUN 36* 39*  CREATININE 1.52* 1.61*  CALCIUM 8.4 8.7   PT/INR No results found for this basename: LABPROT:2,INR:2 in the last 72 hours ABG No results found for this basename: PHART:2,PCO2:2,PO2:2,HCO3:2 in the last 72 hours  Studies/Results: No results found.  Anti-infectives: Anti-infectives     Start     Dose/Rate Route Frequency Ordered Stop   04/13/11 1000   ertapenem (INVANZ) 1 g in sodium chloride 0.9 % 50 mL IVPB        1 g 100 mL/hr over 30 Minutes Intravenous Every 24 hours 04/12/11 1734     04/11/11 1400   ertapenem (INVANZ) 1 g in sodium chloride 0.9 % 50 mL IVPB        1 g 100 mL/hr over 30 Minutes Intravenous 60 min pre-op 04/11/11 1348 04/12/11 1005          Assessment/Plan: [name]/p Procedure([name]) (LRB): CHOLECYSTECTOMY (N/A) LAPAROSCOPIC CHOLECYSTECTOMY (N/A) D/C NG Start sips of clears  LOS: 3 days    TOTH III,Gary [name] 04/15/2011

## 2011-04-16 MED ORDER — OXYCODONE-ACETAMINOPHEN 5-325 MG PO TABS
1.0000 | ORAL_TABLET | ORAL | Status: DC | PRN
  Administered 2011-04-17: 2 via ORAL
  Administered 2011-04-17 – 2011-04-18 (×2): 1 via ORAL
  Administered 2011-04-18: 2 via ORAL
  Filled 2011-04-16 (×2): qty 1
  Filled 2011-04-16: qty 2
  Filled 2011-04-16: qty 1
  Filled 2011-04-16: qty 2
  Filled 2011-04-16: qty 1

## 2011-04-16 NOTE — Progress Notes (Signed)
4 Days Post-Op  Subjective: No complaints. Good BM's yesterday  Objective: Vital signs in last 24 hours: Temp:  [97.2 F (36.2 C)-98.7 F (37.1 C)] 97.2 F (36.2 C) (03/05 0515) Pulse Rate:  [56-75] 72  (03/05 0515) Resp:  [15-25] 22  (03/05 0515) BP: (92-117)/(38-50) 99/42 mmHg (03/05 0515) SpO2:  [97 %-100 %] 100 % (03/05 0515) Last BM Date: 04/16/11  Intake/Output from previous day: 03/04 0701 - 03/05 0700 In: 1228.3 [I.V.:1228.3] Out: [id number] [Urine:975; Drains:130; Stool:650] Intake/Output this shift:    GI: soft, mild tenderness. drain output serosang  Lab Results:   Basename 04/15/11 0510 04/14/11 0451  WBC 8.2 11.2*  HGB 7.6* 8.6*  HCT 24.1* 26.8*  PLT 243 299   BMET  Basename 04/15/11 0510 04/14/11 0451  NA 142 140  K 3.7 5.2*  CL 110 109  CO2 25 20  GLUCOSE 143* 128*  BUN 36* 39*  CREATININE 1.52* 1.61*  CALCIUM 8.4 8.7   PT/INR No results found for this basename: LABPROT:2,INR:2 in the last 72 hours ABG No results found for this basename: PHART:2,PCO2:2,PO2:2,HCO3:2 in the last 72 hours  Studies/Results: No results found.  Anti-infectives: Anti-infectives     Start     Dose/Rate Route Frequency Ordered Stop   04/13/11 1000   ertapenem (INVANZ) 1 g in sodium chloride 0.9 % 50 mL IVPB        1 g 100 mL/hr over 30 Minutes Intravenous Every 24 hours 04/12/11 1734     04/11/11 1400   ertapenem (INVANZ) 1 g in sodium chloride 0.9 % 50 mL IVPB        1 g 100 mL/hr over 30 Minutes Intravenous 60 min pre-op 04/11/11 1348 04/12/11 1005          Assessment/Plan: s/p Procedure(s) (LRB): CHOLECYSTECTOMY (N/A) LAPAROSCOPIC CHOLECYSTECTOMY (N/A) Advance diet PT Consult Transfer to 5100  LOS: 4 days    TOTH III,Gary [name] S 04/16/2011

## 2011-04-16 NOTE — Progress Notes (Signed)
UR of chart complete.  

## 2011-04-17 ENCOUNTER — Encounter (HOSPITAL_COMMUNITY): Payer: Self-pay | Admitting: General Surgery

## 2011-04-17 NOTE — Evaluation (Signed)
Physical Therapy Evaluation Patient Details Name: Gary [name] MRN: [id number] DOB: 24-Sep-1932 Today's Date: 04/17/2011  Problem List:  Patient Active Problem List  Diagnoses  . HYPERTENSION  . CAD, CFX MI Rx'd medically 6/11  . DIVERTICULOSIS OF COLON  . DIVERTICULOSIS, COLON, WITH HEMORRHAGE, surg 12/11   . RECTAL BLEEDING  . PERSONAL HISTORY OF COLONIC POLYPS  . Abdominal bloating  . Loose stools  . Dyslipidemia  . Cholecystitis chronic, acute  . BPH (benign prostatic hyperplasia)  . Hyponatremia  . Fever  . Leucocytosis  . CKD (chronic kidney disease), stage III  . Chest pain    Past Medical History:  Past Medical History  Diagnosis Date  . Hypertension   . Hemorrhoids   . Chronic kidney disease   . Kidney stones   . Dyslipidemia   . Diverticulosis   . Colon polyps   . Angina   . Blood transfusion   . Anemia   . GI bleeding after 07/2009    "triggered by Plavix & ASA; from my diverticulosis"  . GERD (gastroesophageal reflux disease)   . Headache     occasionally  . Anxiety   . Arthritis     "hips and lower back"  . Chronic back pain greater than 3 months duration   . Diarrhea   . Arthritis pain   . Cholecystitis   . Heart attack 07/2009  . Coronary artery disease    Past Surgical History:  Past Surgical History  Procedure Date  . Colon surgery     COLONOSCOPY  . Subtotal colectomy 01/24/2010  . Eye surgery 1942    "right eye was crossed; they  corrected it"  . Posterior fusion lumbar spine 08/1995    "bottom 4 vertebra"  . Back surgery 1997  . Cardiac catheterization 07/2009    PT Assessment/Plan/Recommendation PT Assessment Clinical Impression Statement: Pt. is a 78 y/o male admitted s/p cholecystectomy.  Pt. with decreased mobility and will benefit from PT to maximize functional mobility and independence. PT Recommendation/Assessment: Patient will need skilled PT in the acute care venue PT Problem List: Decreased activity tolerance;Decreased  mobility;Decreased knowledge of use of DME;Decreased knowledge of precautions Barriers to Discharge: None PT Therapy Diagnosis : Difficulty walking PT Plan PT Frequency: Min 3X/week PT Treatment/Interventions: DME instruction;Gait training;Functional mobility training;Therapeutic activities;Therapeutic exercise;Balance training;Patient/family education PT Recommendation Follow Up Recommendations: Home health PT;Supervision - Intermittent Equipment Recommended: Rolling walker with 5" wheels PT Goals  Acute Rehab PT Goals PT Goal Formulation: With patient Time For Goal Achievement: 7 days Pt will go Supine/Side to Sit: Independently;with HOB 0 degrees PT Goal: Supine/Side to Sit - Progress: Goal set today Pt will go Sit to Supine/Side: Independently;with HOB 0 degrees PT Goal: Sit to Supine/Side - Progress: Goal set today Pt will go Sit to Stand: with modified independence;with upper extremity assist PT Goal: Sit to Stand - Progress: Goal set today Pt will go Stand to Sit: with modified independence;with upper extremity assist PT Goal: Stand to Sit - Progress: Goal set today Pt will Ambulate: >150 feet;with modified independence;with rolling walker PT Goal: Ambulate - Progress: Goal set today Pt will Go Up / Down Stairs: 1-2 stairs;with supervision;with rolling walker PT Goal: Up/Down Stairs - Progress: Goal set today  PT Evaluation Precautions/Restrictions  Precautions Precautions: Fall Restrictions Weight Bearing Restrictions: No Prior Functioning  Home Living Lives With: Spouse Receives Help From: Family (wife works part time 11-5 4 days per week) Type of Home: House Home Layout: One level Home  Access: Stairs to enter Entrance Stairs-Rails: None (holds onto door frame) Entrance Stairs-Number of Steps: 1 Bathroom Shower/Tub: Sports coach: Yes How Accessible: Accessible via walker Home Adaptive Equipment:  None Prior Function Level of Independence: Independent with basic ADLs;Independent with homemaking with ambulation;Independent with gait;Independent with transfers Able to Take Stairs?: Yes Driving: Yes Vocation: Retired Leisure: Hobbies-no Cognition Cognition Arousal/Alertness: Awake/alert Overall Cognitive Status: Appears within functional limits for tasks assessed Orientation Level: Oriented X4 Sensation/Coordination Sensation Light Touch: Appears Intact Stereognosis: Not tested Hot/Cold: Not tested Proprioception: Not tested Coordination Gross Motor Movements are Fluid and Coordinated: Yes Fine Motor Movements are Fluid and Coordinated: Yes Extremity Assessment RUE Assessment RUE Assessment: Within Functional Limits LUE Assessment LUE Assessment: Within Functional Limits RLE Assessment RLE Assessment: Within Functional Limits LLE Assessment LLE Assessment: Within Functional Limits Mobility (including Balance) Bed Mobility Bed Mobility: No Transfers Transfers: Yes Sit to Stand: 4: Min assist;From chair/3-in-1;With upper extremity assist (min guard A) Sit to Stand Details (indicate cue type and reason): VCs for safe technique Stand to Sit: 5: Supervision;With upper extremity assist;To chair/3-in-1 Stand to Sit Details: VCs for safe hand placement Ambulation/Gait Ambulation/Gait: Yes Ambulation/Gait Assistance: 4: Min assist Ambulation/Gait Assistance Details (indicate cue type and reason): decreased step length bil, short shuffling steps Ambulation Distance (Feet): 125 Feet Assistive device: Rolling walker Gait Pattern: Decreased step length - right;Decreased step length - left;Shuffle Stairs: No Architect: No  Posture/Postural Control Posture/Postural Control: No significant limitations Balance Balance Assessed: No Exercise    End of Session PT - End of Session Equipment Utilized During Treatment: Gait belt Activity Tolerance:  Patient tolerated treatment well;Patient limited by fatigue Patient left: in chair;with call bell in reach Nurse Communication: Mobility status for ambulation General Behavior During Session: [facility] for tasks performed Cognition: [facility] for tasks performed  Feltis, Jackqulyn Livings 04/17/2011, 10:07 AM  Jackqulyn Livings. Feltis, PT, DPT [phone]

## 2011-04-17 NOTE — Clinical Documentation Improvement (Signed)
GENERIC DOCUMENTATION CLARIFICATION QUERY  THIS DOCUMENT IS NOT A PERMANENT PART OF THE MEDICAL RECORD  TO RESPOND TO THE THIS QUERY, FOLLOW THE INSTRUCTIONS BELOW:  1. If needed, update documentation for the patient's encounter via the notes activity.  2. Access this query again and click edit on the In [employer].  3. After updating, or not, click F2 to complete all highlighted (required) fields concerning your review. Select "additional documentation in the medical record" OR "no additional documentation provided".  4. Click Sign note button.  5. The deficiency will fall out of your In Basket *Please let us know if you are not able to complete this workflow by phone or e-mail (listed below).  Please update your documentation within the medical record to reflect your response to this query.                                                                                         04/17/11   Dear Dr. Sammuel Hines. [facility],  In a better effort to capture your patient's severity of illness, reflect appropriate length of stay and utilization of resources, a review of the patient medical record has revealed the following indicators.    Based on your clinical judgment, please clarify and document in a progress note and/or discharge summary the clinical condition associated with the following supporting information:  In responding to this query please exercise your independent judgment.  The fact that a query is asked, does not imply that any particular answer is desired or expected.  Pt is receiving Invanz 1gm IVPB daily s/p Open Cholecystectomy (post op day 5).  If possible, please document in Progress Notes what is being treated.    You may use possible, probable, or suspect with inpatient documentation. possible, probable, suspected diagnoses MUST be documented at the time of discharge  Reviewed:  no additional documentation provided  Thank You,    Margarite Gouge RN,  BSN Clinical Documentation Specialist Pager:  [phone] [email or web address]  [place]

## 2011-04-17 NOTE — Progress Notes (Addendum)
5 Days Post-Op  Subjective: No complaints.  Objective: Vital signs in last 24 hours: Temp:  [98 F (36.7 C)-98.6 F (37 C)] 98 F (36.7 C) (03/06 0600) Pulse Rate:  [64-77] 76  (03/06 0600) Resp:  [16-22] 16  (03/06 0600) BP: (90-121)/(51-65) 113/52 mmHg (03/06 0600) SpO2:  [94 %-100 %] 94 % (03/06 0600) Last BM Date: 04/16/11  Intake/Output from previous day: 03/05 0701 - 03/06 0700 In: 960 [P.O.:960] Out: 335 [Urine:250; Drains:85] Intake/Output this shift:    GI: soft, nontender. drain output serosang. good bm's  Lab Results:   Basename 04/15/11 0510  WBC 8.2  HGB 7.6*  HCT 24.1*  PLT 243   BMET  Basename 04/15/11 0510  NA 142  K 3.7  CL 110  CO2 25  GLUCOSE 143*  BUN 36*  CREATININE 1.52*  CALCIUM 8.4   PT/INR No results found for this basename: LABPROT:2,INR:2 in the last 72 hours ABG No results found for this basename: PHART:2,PCO2:2,PO2:2,HCO3:2 in the last 72 hours  Studies/Results: No results found.  Anti-infectives: Anti-infectives     Start     Dose/Rate Route Frequency Ordered Stop   04/13/11 1000   ertapenem (INVANZ) 1 g in sodium chloride 0.9 % 50 mL IVPB        1 g 100 mL/hr over 30 Minutes Intravenous Every 24 hours 04/12/11 1734     04/11/11 1400   ertapenem (INVANZ) 1 g in sodium chloride 0.9 % 50 mL IVPB        1 g 100 mL/hr over 30 Minutes Intravenous 60 min pre-op 04/11/11 1348 04/12/11 1005          Assessment/Plan: s/p Procedure(s) (LRB): CHOLECYSTECTOMY (N/A) LAPAROSCOPIC CHOLECYSTECTOMY (N/A) Advance diet PT eval Ambulate in hall Hopefully home soon  LOS: 5 days    [facility],Kemani Demarais S 04/17/2011

## 2011-04-18 MED ORDER — HYDROCODONE-ACETAMINOPHEN 5-325 MG PO TABS
1.0000 | ORAL_TABLET | ORAL | Status: AC | PRN
Start: 2011-04-18 — End: 2011-04-28

## 2011-04-18 NOTE — Discharge Summary (Signed)
Physician Discharge Summary  Patient ID: Gary [name] MRN: [id number] DOB/AGE: 1932-06-12 78 y.o.  Admit date: 04/12/2011 Discharge date: 04/18/2011  Admission Diagnoses:  Discharge Diagnoses:  Active Problems:  Cholecystitis chronic, acute   Discharged Condition: good  Hospital Course: the patient underwent open cholecystectomy. His postop course was complicated by ileus. This has now resolved and he is ready for d/c. We will remove drain and wicks prior to d/c  Consults: None  Significant Diagnostic Studies: none  Treatments: surgery: open cholecystectomy  Discharge Exam: Blood pressure 123/70, pulse 73, temperature 98 F (36.7 C), temperature source Oral, resp. rate 16, height 5' 8.5" (1.74 m), weight 202 lb 13.2 oz (92 kg), SpO2 96.00%. GI: soft, mild tenderness. incision ok  Disposition: 01-Home or Self Care   Medication List  As of 04/18/2011  9:15 AM   ASK your doctor about these medications         aspirin 81 MG EC tablet   Take 81 mg by mouth daily. Swallow whole.      ciprofloxacin 500 MG tablet   Commonly known as: CIPRO   Take 500 mg by mouth 2 (two) times daily.      citalopram 20 MG tablet   Commonly known as: CELEXA   Take 20 mg by mouth daily.      doxazosin 8 MG tablet   Commonly known as: CARDURA   Take 8 mg by mouth at bedtime.      fexofenadine 180 MG tablet   Commonly known as: ALLEGRA   Take 180 mg by mouth daily.      FLONASE 50 MCG/ACT nasal spray   Generic drug: fluticasone   2 sprays by Nasal route daily.      furosemide 20 MG tablet   Commonly known as: LASIX   Take 20 mg by mouth daily.      gabapentin 400 MG capsule   Commonly known as: NEURONTIN   Take 400 mg by mouth 3 (three) times daily.      lisinopril 10 MG tablet   Commonly known as: PRINIVIL,ZESTRIL   Take 5 mg by mouth daily.      loperamide 2 MG capsule   Commonly known as: IMODIUM   Take 2 mg by mouth 4 (four) times daily as needed. For diarrhea     metroNIDAZOLE 500 MG tablet   Commonly known as: FLAGYL   Take 500 mg by mouth 3 (three) times daily.      nebivolol 5 MG tablet   Commonly known as: BYSTOLIC   Take 5 mg by mouth daily.      nitroGLYCERIN 0.4 MG SL tablet   Commonly known as: NITROSTAT   Place 0.4 mg under the tongue every 5 (five) minutes as needed. For chest pain.      omeprazole 20 MG capsule   Commonly known as: PRILOSEC   Take 40 mg by mouth daily.      simvastatin 40 MG tablet   Commonly known as: ZOCOR   Take 40 mg by mouth at bedtime.      Tamsulosin HCl 0.4 MG Caps   Commonly known as: FLOMAX   Take 0.4 mg by mouth daily after supper.      testosterone 50 MG/5GM Gel   Commonly known as: ANDROGEL   Place 5 g onto the skin daily.      traMADol-acetaminophen 37.5-325 MG per tablet   Commonly known as: ULTRACET   Take 1 tablet by mouth every 6 (six) hours  as needed. For pain.      TYLENOL 500 MG tablet   Generic drug: acetaminophen   Take 500 mg by mouth every 6 (six) hours as needed. For pain.           Follow-up Information    Follow up with Adin Hector, MD in 1 week.   Contact information:   [employer], [place], [place] [phone]          Signed: [name] 04/18/2011, 9:15 AM

## 2011-04-18 NOTE — Progress Notes (Signed)
6 Days Post-Op  Subjective: No complaints  Objective: Vital signs in last 24 hours: Temp:  [98 F (36.7 C)-98.7 F (37.1 C)] 98 F (36.7 C) (03/07 0555) Pulse Rate:  [67-73] 73  (03/07 0555) Resp:  [16-20] 16  (03/07 0555) BP: (113-129)/(59-70) 123/70 mmHg (03/07 0555) SpO2:  [96 %-100 %] 96 % (03/07 0555) Last BM Date: 04/18/11  Intake/Output from previous day: 03/06 0701 - 03/07 0700 In: -  Out: 655 [Urine:600; Drains:55] Intake/Output this shift:    GI: soft, mild tenderness. incision ok. jp output serous  Lab Results:  No results found for this basename: WBC:2,HGB:2,HCT:2,PLT:2 in the last 72 hours BMET No results found for this basename: NA:2,K:2,CL:2,CO2:2,GLUCOSE:2,BUN:2,CREATININE:2,CALCIUM:2 in the last 72 hours PT/INR No results found for this basename: LABPROT:2,INR:2 in the last 72 hours ABG No results found for this basename: PHART:2,PCO2:2,PO2:2,HCO3:2 in the last 72 hours  Studies/Results: No results found.  Anti-infectives: Anti-infectives     Start     Dose/Rate Route Frequency Ordered Stop   04/13/11 1000   ertapenem (INVANZ) 1 g in sodium chloride 0.9 % 50 mL IVPB  Status:  Discontinued        1 g 100 mL/hr over 30 Minutes Intravenous Every 24 hours 04/12/11 1734 04/17/11 1214   04/11/11 1400   ertapenem (INVANZ) 1 g in sodium chloride 0.9 % 50 mL IVPB        1 g 100 mL/hr over 30 Minutes Intravenous 60 min pre-op 04/11/11 1348 04/12/11 1005          Assessment/Plan: s/p Procedure(s) (LRB): CHOLECYSTECTOMY (N/A) LAPAROSCOPIC CHOLECYSTECTOMY (N/A) Discharge  LOS: 6 days    [facility] III,Karryn Kosinski S 04/18/2011

## 2011-04-18 NOTE — Progress Notes (Signed)
Spoke with pt and wife about need for HHPT and HHRN.  Both were coming prior to his admission from [facility] and they both feel the need to continue services. Verified they wanted to stay with [facility] and sent orders to MD for signature to resume services as well as added a rolling walker for safe ambulation per PT recommendations. Face to face form also sent to MD for signature. Address and phone remain the same from pt's previous services.

## 2011-04-22 ENCOUNTER — Ambulatory Visit (INDEPENDENT_AMBULATORY_CARE_PROVIDER_SITE_OTHER): Payer: Medicare Other | Admitting: General Surgery

## 2011-04-22 ENCOUNTER — Encounter (INDEPENDENT_AMBULATORY_CARE_PROVIDER_SITE_OTHER): Payer: Self-pay | Admitting: General Surgery

## 2011-04-22 DIAGNOSIS — Z9889 Other specified postprocedural states: Secondary | ICD-10-CM

## 2011-04-22 NOTE — Progress Notes (Signed)
Subjective:     Patient ID: Gary [name], male   DOB: 11/02/1932, 78 y.o.   MRN: [id number]  HPI This patient underwent open cholecystectomy on April 12, 2011. This was complicated by the presence of a cholecystostomy drain. Also complicated by moderately severe adhesions of multiple loops of small bowel to the right hemidiaphragm and the dome of the liver. The surgery was done as an open procedure.  Postoperatively he had significant atelectasis of the left lung immediately postop. The reason for this was unclear. One theoritical consideration is that the endotracheal tube was too far down in the right mainstem bronchus. He recovered from a pulmonary standpoint. He was discharged home on the fourth or fifth postoperative day. His drains were removed prior to discharge.  He has done fairly well. He is a little bit weak. He has not driven his car. His pain is under control. His appetite is good. His bowel function has returned to his normal state, remembering that he has had a subtotal colectomy in the past for diverticulosis. No fever or chills. Denies chest pain. Denies shortness of breath. Denies cough or sputum production.  Review of Systems     Objective:   Physical Exam Constitutional: Patient looks well. A little bit deconditioned. In no distress. Alert. Abdomen: Soft. Nontender. Incision is healing well. Staples removed. Steri-Strips applied. No sign of infection. Neurologic: Ambulating independently.    Assessment:     Acute and chronic cholecystitis. Recovering uneventfully following open cholecystectomy. History percutaneous cholecystostomy tube for acute cholecystitis, December 2012. Status post subtotal colectomy for lower GI bleed and diverticulosis. Remote. Coronary Artery disease, status post myocardial infarction. Hypertension    Plan:     Diet and activities discussed. Return to see me in 6 weeks for wound check.    Edsel Petrin. Dalbert Batman, M.D., [facility] General and Minimally invasive Surgery Breast and Colorectal Surgery Office:   [phone] Pager:   [phone]

## 2011-04-22 NOTE — Patient Instructions (Signed)
You  seem to be recovering without any major complication from your recent gallbladder surgery. I encourage you to take a walk out of doors daily. I encourage you to stay hydrated. No sports or heavy lifting for 6 weeks from the date of surgery. Return to see Dr. Dalbert Batman in 6 weeks for a wound healing check.

## 2011-04-26 ENCOUNTER — Telehealth (INDEPENDENT_AMBULATORY_CARE_PROVIDER_SITE_OTHER): Payer: Self-pay

## 2011-04-26 NOTE — Telephone Encounter (Signed)
Pt called stating he has HHN that comes to check him but every afternoon he states he seems to be chilled. I asked pt of he has any fever. Pt states he has not taken his temp and the nurse has not mentioned any fever. I have advised pt he needs to take his temperature at least twice a day and when he feels chilled. Pt advised if he has a fever he needs to call and let us know asap so we can alert his doctor. Pt states he is eating well,voiding well and BMs are normal. Pt states other than afternoon being chilled he is doing well. Pt advised we have MD on call on weekends if he does develop fever or other symptoms.

## 2011-05-08 ENCOUNTER — Other Ambulatory Visit: Payer: Self-pay | Admitting: Dermatology

## 2011-05-13 ENCOUNTER — Telehealth (INDEPENDENT_AMBULATORY_CARE_PROVIDER_SITE_OTHER): Payer: Self-pay | Admitting: General Surgery

## 2011-05-16 ENCOUNTER — Ambulatory Visit (INDEPENDENT_AMBULATORY_CARE_PROVIDER_SITE_OTHER): Payer: Medicare Other | Admitting: General Surgery

## 2011-05-16 ENCOUNTER — Encounter (INDEPENDENT_AMBULATORY_CARE_PROVIDER_SITE_OTHER): Payer: Self-pay | Admitting: General Surgery

## 2011-05-16 ENCOUNTER — Encounter (INDEPENDENT_AMBULATORY_CARE_PROVIDER_SITE_OTHER): Payer: Medicare Other

## 2011-05-16 VITALS — BP 132/77 | HR 86 | Temp 98.4°F | Ht 68.5 in | Wt 182.6 lb

## 2011-05-16 DIAGNOSIS — Z9889 Other specified postprocedural states: Secondary | ICD-10-CM

## 2011-05-16 NOTE — Progress Notes (Signed)
Subjective:     Patient ID: Gary [name], male   DOB: 1932/08/07, 78 y.o.   MRN: [id number]  HPI This patient underwent open cholecystectomy on April 12, 2011. This was complicated by the presence of a cholecystostomy drain. Also complicated by moderately severe adhesions of multiple loops of small bowel to the right hemidiaphragm and the dome of the liver. The surgery was done as an open procedure.  He is now doing well. He wants to return to the gym. Appetite normal. Stool volume is about the same as always, considering his past history of subtotal colectomy.  He has a small stitch protruding the lateral aspect of the wound.  Review of Systems     Objective:   Physical Exam Patient looks well. He is in good spirits.  Abdomen soft and nontender. Right subcostal incision well-healed. Small PDS suture protruding from the lateral aspect of the incision. This was pulled up and cut and removed. No infection.   Assessment:     Status post open cholecystectomy with lysis of adhesions and removal of cholecystostomy drain. Doing well.   Plan:     Okay to resume all normal physical activities without restriction.  Return to see me p.r.n.  Edsel Petrin. Dalbert Batman, M.D., [facility] General and Minimally invasive Surgery Breast and Colorectal Surgery Office:   [phone] Pager:   [phone]

## 2011-05-16 NOTE — Patient Instructions (Signed)
You have recovered from your gallbladder surgery without any obvious complication. I removed a small suture from your wound today. You may resume all normal activities, including exercise in the gym, starting now. Stay on your usual diet. Return to see Dr. Dalbert Batman if any further problems arise.

## 2011-05-21 ENCOUNTER — Emergency Department (HOSPITAL_COMMUNITY)
Admission: EM | Admit: 2011-05-21 | Discharge: 2011-05-22 | Disposition: A | Discharge status: Home / Self Care | Payer: Medicare Other | Attending: Emergency Medicine | Admitting: Emergency Medicine

## 2011-05-21 ENCOUNTER — Emergency Department (HOSPITAL_COMMUNITY): Payer: Medicare Other

## 2011-05-21 ENCOUNTER — Encounter (HOSPITAL_COMMUNITY): Payer: Self-pay | Admitting: *Deleted

## 2011-05-21 DIAGNOSIS — H18419 Arcus senilis, unspecified eye: Secondary | ICD-10-CM | POA: Insufficient documentation

## 2011-05-21 DIAGNOSIS — R221 Localized swelling, mass and lump, neck: Secondary | ICD-10-CM | POA: Insufficient documentation

## 2011-05-21 DIAGNOSIS — Y9355 Activity, bike riding: Secondary | ICD-10-CM | POA: Insufficient documentation

## 2011-05-21 DIAGNOSIS — S0180XA Unspecified open wound of other part of head, initial encounter: Secondary | ICD-10-CM | POA: Insufficient documentation

## 2011-05-21 DIAGNOSIS — M2550 Pain in unspecified joint: Secondary | ICD-10-CM | POA: Insufficient documentation

## 2011-05-21 DIAGNOSIS — S01112A Laceration without foreign body of left eyelid and periocular area, initial encounter: Secondary | ICD-10-CM

## 2011-05-21 DIAGNOSIS — S0990XA Unspecified injury of head, initial encounter: Secondary | ICD-10-CM

## 2011-05-21 DIAGNOSIS — N189 Chronic kidney disease, unspecified: Secondary | ICD-10-CM | POA: Insufficient documentation

## 2011-05-21 DIAGNOSIS — F411 Generalized anxiety disorder: Secondary | ICD-10-CM | POA: Insufficient documentation

## 2011-05-21 DIAGNOSIS — I2510 Atherosclerotic heart disease of native coronary artery without angina pectoris: Secondary | ICD-10-CM | POA: Insufficient documentation

## 2011-05-21 DIAGNOSIS — S139XXA Sprain of joints and ligaments of unspecified parts of neck, initial encounter: Secondary | ICD-10-CM | POA: Insufficient documentation

## 2011-05-21 DIAGNOSIS — S161XXA Strain of muscle, fascia and tendon at neck level, initial encounter: Secondary | ICD-10-CM

## 2011-05-21 DIAGNOSIS — Z79899 Other long term (current) drug therapy: Secondary | ICD-10-CM | POA: Insufficient documentation

## 2011-05-21 DIAGNOSIS — E785 Hyperlipidemia, unspecified: Secondary | ICD-10-CM | POA: Insufficient documentation

## 2011-05-21 DIAGNOSIS — I129 Hypertensive chronic kidney disease with stage 1 through stage 4 chronic kidney disease, or unspecified chronic kidney disease: Secondary | ICD-10-CM | POA: Insufficient documentation

## 2011-05-21 DIAGNOSIS — IMO0001 Reserved for inherently not codable concepts without codable children: Secondary | ICD-10-CM | POA: Insufficient documentation

## 2011-05-21 DIAGNOSIS — Z7982 Long term (current) use of aspirin: Secondary | ICD-10-CM | POA: Insufficient documentation

## 2011-05-21 DIAGNOSIS — M542 Cervicalgia: Secondary | ICD-10-CM | POA: Insufficient documentation

## 2011-05-21 DIAGNOSIS — IMO0002 Reserved for concepts with insufficient information to code with codable children: Secondary | ICD-10-CM | POA: Insufficient documentation

## 2011-05-21 DIAGNOSIS — M129 Arthropathy, unspecified: Secondary | ICD-10-CM | POA: Insufficient documentation

## 2011-05-21 DIAGNOSIS — K219 Gastro-esophageal reflux disease without esophagitis: Secondary | ICD-10-CM | POA: Insufficient documentation

## 2011-05-21 DIAGNOSIS — Z87442 Personal history of urinary calculi: Secondary | ICD-10-CM | POA: Insufficient documentation

## 2011-05-21 DIAGNOSIS — R220 Localized swelling, mass and lump, head: Secondary | ICD-10-CM | POA: Insufficient documentation

## 2011-05-21 MED ORDER — HYDROCODONE-ACETAMINOPHEN 5-325 MG PO TABS
1.0000 | ORAL_TABLET | Freq: Once | ORAL | Status: AC
  Administered 2011-05-22: 1 via ORAL
  Filled 2011-05-21: qty 1
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
## 2011-05-21 NOTE — ED Notes (Signed)
Pt back from Ct
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
## 2011-05-22 MED ORDER — HYDROCODONE-ACETAMINOPHEN 5-325 MG PO TABS: 1.0000 | ORAL_TABLET | ORAL | Status: AC | PRN

## 2011-05-22 MED ORDER — CYCLOBENZAPRINE HCL 10 MG PO TABS: 10.0000 mg | ORAL_TABLET | Freq: Two times a day (BID) | ORAL | Status: AC | PRN

## 2011-05-22 NOTE — ED Provider Notes (Signed)
Medical screening examination/treatment/procedure(s) were conducted as a shared visit with non-physician practitioner(s) and myself.  I personally evaluated the patient during the encounter  Abrasion to the left forehead PERRL No hemotympanums MMM Neck without crepitance or step offs RRR CTAB NABS no tenderness Intact L5/s1 FROM x 4 extremities  Irmgard Rampersaud K Endora Teresi-Rasch, MD 05/22/11 [id number]
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
## 2011-05-22 NOTE — ED Notes (Signed)
Pt states pain much improved.  Continues to be ao x 4 and deny photophobia, headache, nausea.

## 2011-05-22 NOTE — Discharge Instructions (Signed)
Cervical Sprain A cervical sprain is an injury in the neck in which the ligaments are stretched or torn. The ligaments are the tissues that hold the bones of the neck (vertebrae) in place.Cervical sprains can range from very mild to very severe. Most cervical sprains get better in 1 to 3 weeks, but it depends on the cause and extent of the injury. Severe cervical sprains can cause the neck vertebrae to be unstable. This can lead to damage of the spinal cord and can result in serious nervous system problems. Your caregiver will determine whether your cervical sprain is mild or severe. CAUSES  Severe cervical sprains may be caused by:  Contact sport injuries (football, rugby, wrestling, hockey, auto racing, gymnastics, diving, martial arts, boxing).   Motor vehicle collisions.   Whiplash injuries. This means the neck is forcefully whipped backward and forward.   Falls.  Mild cervical sprains may be caused by:   Awkward positions, such as cradling a telephone between your ear and shoulder.   Sitting in a chair that does not offer proper support.   Working at a poorly Landscape architect station.   Activities that require looking up or down for long periods of time.  SYMPTOMS   Pain, soreness, stiffness, or a burning sensation in the front, back, or sides of the neck. This discomfort may develop immediately after injury or it may develop slowly and not begin for 24 hours or more after an injury.   Pain or tenderness directly in the middle of the back of the neck.   Shoulder or upper back pain.   Limited ability to move the neck.   Headache.   Dizziness.   Weakness, numbness, or tingling in the hands or arms.   Muscle spasms.   Difficulty swallowing or chewing.   Tenderness and swelling of the neck.  DIAGNOSIS  Most of the time, your caregiver can diagnose this problem by taking your history and doing a physical exam. Your caregiver will ask about any known problems, such as  arthritis in the neck or a previous neck injury. X-rays may be taken to find out if there are any other problems, such as problems with the bones of the neck. However, an X-ray often does not reveal the full extent of a cervical sprain. Other tests such as a computed tomography (CT) scan or magnetic resonance imaging (MRI) may be needed. TREATMENT  Treatment depends on the severity of the cervical sprain. Mild sprains can be treated with rest, keeping the neck in place (immobilization), and pain medicines. Severe cervical sprains need immediate immobilization and an appointment with an orthopedist or neurosurgeon. Several treatment options are available to help with pain, muscle spasms, and other symptoms. Your caregiver may prescribe:  Medicines, such as pain relievers, numbing medicines, or muscle relaxants.   Physical therapy. This can include stretching exercises, strengthening exercises, and posture training. Exercises and improved posture can help stabilize the neck, strengthen muscles, and help stop symptoms from returning.   A neck collar to be worn for short periods of time. Often, these collars are worn for comfort. However, certain collars may be worn to protect the neck and prevent further worsening of a serious cervical sprain.  HOME CARE INSTRUCTIONS   Put ice on the injured area.   Put ice in a plastic bag.   Place a towel between your skin and the bag.   Leave the ice on for 15 to 20 minutes, 3 to 4 times a day.  Only take over-the-counter or prescription medicines for pain, discomfort, or fever as directed by your caregiver.   Keep all follow-up appointments as directed by your caregiver.   Keep all physical therapy appointments as directed by your caregiver.   If a neck collar is prescribed, wear it as directed by your caregiver.   Do not drive while wearing a neck collar.   Make any needed adjustments to your work station to promote good posture.   Avoid positions  and activities that make your symptoms worse.   Warm up and stretch before being active to help prevent problems.  SEEK MEDICAL CARE IF:   Your pain is not controlled with medicine.   You are unable to decrease your pain medicine over time as planned.   Your activity level is not improving as expected.  SEEK IMMEDIATE MEDICAL CARE IF:   You develop any bleeding, stomach upset, or signs of an allergic reaction to your medicine.   Your symptoms get worse.   You develop new, unexplained symptoms.   You have numbness, tingling, weakness, or paralysis in any part of your body.  MAKE SURE YOU:   Understand these instructions.   Will watch your condition.   Will get help right away if you are not doing well or get worse.  Document Released: 11/25/2006 Document Revised: 01/17/2011 Document Reviewed: 10/31/2010 [facility] Patient Information 2012 [place].Concussion and Brain Injury A blow or jolt to the head can disrupt the normal function of the brain. This type of brain injury is often called a "concussion" or a "closed head injury." Concussions are usually not life-threatening. Even so, the effects of a concussion can be serious.  CAUSES  A concussion is caused by a blunt blow to the head. The blow might be direct or indirect as described below.  Direct blow (running into another player during a soccer game, being hit in a fight, or hitting your head on a hard surface).   Indirect blow (when your head moves rapidly and violently back and forth like in a car crash).  SYMPTOMS  The brain is very complex. Every head injury is different. Some symptoms may appear right away. Other symptoms may not show up for days or weeks after the concussion. The signs of concussion can be hard to notice. Early on, problems may be missed by patients, family members, and caregivers. You may look fine even though you are acting or feeling differently.  These symptoms are usually temporary, but may  last for days, weeks, or even longer. Symptoms include:  Mild headaches that will not go away.   Having more trouble than usual with:   Remembering things.   Paying attention or concentrating.   Organizing daily tasks.   Making decisions and solving problems.   Slowness in thinking, acting, speaking, or reading.   Getting lost or easily confused.   Feeling tired all the time or lacking energy (fatigue).   Feeling drowsy.   Sleep disturbances.   Sleeping more than usual.   Sleeping less than usual.   Trouble falling asleep.   Trouble sleeping (insomnia).   Loss of balance or feeling lightheaded or dizzy.   Nausea or vomiting.   Numbness or tingling.   Increased sensitivity to:   Sounds.   Lights.   Distractions.  Other symptoms might include:  Vision problems or eyes that tire easily.   Diminished sense of taste or smell.   Ringing in the ears.   Mood changes such as feeling  sad, anxious, or listless.   Becoming easily irritated or angry for little or no reason.   Lack of motivation.  DIAGNOSIS  Your caregiver can usually diagnose a concussion or mild brain injury based on your description of your injury and your symptoms.  Your evaluation might include:  A brain scan to look for signs of injury to the brain. Even if the test shows no injury, you may still have a concussion.   Blood tests to be sure other problems are not present.  TREATMENT   People with a concussion need to be examined and evaluated. Most people with concussions are treated in an emergency department, urgent care, or clinic. Some people must stay in the hospital overnight for further treatment.   Your caregiver will send you home with important instructions to follow. Be sure to carefully follow them.   Tell your caregiver if you are already taking any medicines (prescription, over-the-counter, or natural remedies), or if you are drinking alcohol or taking illegal drugs. Also,  talk with your caregiver if you are taking blood thinners (anticoagulants) or aspirin. These drugs may increase your chances of complications. All of this is important information that may affect treatment.   Only take over-the-counter or prescription medicines for pain, discomfort, or fever as directed by your caregiver.  PROGNOSIS  How fast people recover from brain injury varies from person to person. Although most people have a good recovery, how quickly they improve depends on many factors. These factors include how severe their concussion was, what part of the brain was injured, their age, and how healthy they were before the concussion.  Because all head injuries are different, so is recovery. Most people with mild injuries recover fully. Recovery can take time. In general, recovery is slower in older persons. Also, persons who have had a concussion in the past or have other medical problems may find that it takes longer to recover from their current injury. Anxiety and depression may also make it harder to adjust to the symptoms of brain injury. HOME CARE INSTRUCTIONS  Return to your normal activities slowly, not all at once. You must give your body and brain enough time for recovery.  Get plenty of sleep at night, and rest during the day. Rest helps the brain to heal.   Avoid staying up late at night.   Keep the same bedtime hours on weekends and weekdays.   Take daytime naps or rest breaks when you feel tired.   Limit activities that require a lot of thought or concentration (brain or cognitive rest). This includes:   Homework or job-related work.   Watching TV.   Computer work.   Avoid activities that could lead to a second brain injury, such as contact or recreational sports, until your caregiver says it is okay. Even after your brain injury has healed, you should protect yourself from having another concussion.   Ask your caregiver when you can return to your normal activities  such as driving, bicycling, or operating heavy equipment. Your ability to react may be slower after a brain injury.   Talk with your caregiver about when you can return to work or school.   Inform your teachers, school nurse, school counselor, coach, Product/process development scientist, or work Freight forwarder about your injury, symptoms, and restrictions. They should be instructed to report:   Increased problems with attention or concentration.   Increased problems remembering or learning new information.   Increased time needed to complete tasks or assignments.  Increased irritability or decreased ability to cope with stress.   Increased symptoms.   Take only those medicines that your caregiver has approved.   Do not drink alcohol until your caregiver says you are well enough to do so. Alcohol and certain other drugs may slow your recovery and can put you at risk of further injury.   If it is harder than usual to remember things, write them down.   If you are easily distracted, try to do one thing at a time. For example, do not try to watch TV while fixing dinner.   Talk with family members or close friends when making important decisions.   Keep all follow-up appointments. Repeated evaluation of your symptoms is recommended for your recovery.  PREVENTION  Protect your head from future injury. It is very important to avoid another head or brain injury before you have recovered. In rare cases, another injury has lead to permanent brain damage, brain swelling, or death. Avoid injuries by using:  Seatbelts when riding in a car.   Alcohol only in moderation.   A helmet when biking, skiing, skateboarding, skating, or doing similar activities.   Safety measures in your home.   Remove clutter and tripping hazards from floors and stairways.   Use grab bars in bathrooms and handrails by stairs.   Place non-slip mats on floors and in bathtubs.   Improve lighting in dim areas.  SEEK MEDICAL CARE IF:  A  head injury can cause lingering symptoms. You should seek medical care if you have any of the following symptoms for more than 3 weeks after your injury or are planning to return to sports:  Chronic headaches.   Dizziness or balance problems.   Nausea.   Vision problems.   Increased sensitivity to noise or light.   Depression or mood swings.   Anxiety or irritability.   Memory problems.   Difficulty concentrating or paying attention.   Sleep problems.   Feeling tired all the time.  SEEK IMMEDIATE MEDICAL CARE IF:  You have had a blow or jolt to the head and you (or your family or friends) notice:  Severe or worsening headaches.   Weakness (even if only in one hand or one leg or one part of the face), numbness, or decreased coordination.   Repeated vomiting.   Increased sleepiness or passing out.   One black center of the eye (pupil) is larger than the other.   Convulsions (seizures).   Slurred speech.   Increasing confusion, restlessness, agitation, or irritability.   Lack of ability to recognize people or places.   Neck pain.   Difficulty being awakened.   Unusual behavior changes.   Loss of consciousness.  Older adults with a brain injury may have a higher risk of serious complications such as a blood clot on the brain. Headaches that get worse or an increase in confusion are signs of this complication. If these signs occur, see a caregiver right away. MAKE SURE YOU:   Understand these instructions.   Will watch your condition.   Will get help right away if you are not doing well or get worse.  FOR MORE INFORMATION  Several groups help people with brain injury and their families. They provide information and put people in touch with local resources. These include support groups, rehabilitation services, and a variety of health care professionals. Among these groups, the Brain Injury Association (BIA, www.biausa.org) has a Producer, television/film/video that gathers  scientific and educational information and  works on a national level to help people with brain injury.  Document Released: 04/20/2003 Document Revised: 01/17/2011 Document Reviewed: 09/16/2007 [facility] Patient Information 2012 [place].Facial Laceration A facial laceration is a cut on the face. Lacerations usually heal quickly, but they need special care to reduce scarring. It will take 1 to 2 years for the scar to lose its redness and to heal completely. TREATMENT  Some facial lacerations may not require closure. Some lacerations may not be able to be closed due to an increased risk of infection. It is important to see your caregiver as soon as possible after an injury to minimize the risk of infection and to maximize the opportunity for successful closure. If closure is appropriate, pain medicines may be given, if needed. The wound will be cleaned to help prevent infection. Your caregiver will use stitches (sutures), staples, wound glue (adhesive), or skin adhesive strips to repair the laceration. These tools bring the skin edges together to allow for faster healing and a better cosmetic outcome. However, all wounds will heal with a scar.  Once the wound has healed, scarring can be minimized by covering the wound with sunscreen during the day for 1 full year. Use a sunscreen with an SPF of at least 30. Sunscreen helps to reduce the pigment that will form in the scar. When applying sunscreen to a completely healed wound, massage the scar for a few minutes to help reduce the appearance of the scar. Use circular motions with your fingertips, on and around the scar. Do not massage a healing wound. HOME CARE INSTRUCTIONS For sutures:  Keep the wound clean and dry.   If you were given a bandage (dressing), you should change it at least once a day. Also change the dressing if it becomes wet or dirty, or as directed by your caregiver.   Wash the wound with soap and water 2 times a day. Rinse the  wound off with water to remove all soap. Pat the wound dry with a clean towel.   After cleaning, apply a thin layer of the antibiotic ointment recommended by your caregiver. This will help prevent infection and keep the dressing from sticking.   You may shower as usual after the first 24 hours. Do not soak the wound in water until the sutures are removed.   Only take over-the-counter or prescription medicines for pain, discomfort, or fever as directed by your caregiver.   Get your sutures removed as directed by your caregiver. With facial lacerations, sutures should usually be taken out after 4 to 5 days to avoid stitch marks.   Wait a few days after your sutures are removed before applying makeup.  For skin adhesive strips:  Keep the wound clean and dry.   Do not get the skin adhesive strips wet. You may bathe carefully, using caution to keep the wound dry.   If the wound gets wet, pat it dry with a clean towel.   Skin adhesive strips will fall off on their own. You may trim the strips as the wound heals. Do not remove skin adhesive strips that are still stuck to the wound. They will fall off in time.  For wound adhesive:  You may briefly wet your wound in the shower or bath. Do not soak or scrub the wound. Do not swim. Avoid periods of heavy perspiration until the skin adhesive has fallen off on its own. After showering or bathing, gently pat the wound dry with a clean towel.  Do not apply liquid medicine, cream medicine, ointment medicine, or makeup to your wound while the skin adhesive is in place. This may loosen the film before your wound is healed.   If a dressing is placed over the wound, be careful not to apply tape directly over the skin adhesive. This may cause the adhesive to be pulled off before the wound is healed.   Avoid prolonged exposure to sunlight or tanning lamps while the skin adhesive is in place. Exposure to ultraviolet light in the first year will darken the  scar.   The skin adhesive will usually remain in place for 5 to 10 days, then naturally fall off the skin. Do not pick at the adhesive film.  You may need a tetanus shot if:  You cannot remember when you had your last tetanus shot.   You have never had a tetanus shot.  If you get a tetanus shot, your arm may swell, get red, and feel warm to the touch. This is common and not a problem. If you need a tetanus shot and you choose not to have one, there is a rare chance of getting tetanus. Sickness from tetanus can be serious. SEEK IMMEDIATE MEDICAL CARE IF:  You develop redness, pain, or swelling around the wound.   There is yellowish-white fluid (pus) coming from the wound.   You develop chills or a fever.  MAKE SURE YOU:  Understand these instructions.   Will watch your condition.   Will get help right away if you are not doing well or get worse.  Document Released: 03/07/2004 Document Revised: 01/17/2011 Document Reviewed: 07/23/2010 [facility] Patient Information 2012 [place].

## 2011-06-06 ENCOUNTER — Encounter (INDEPENDENT_AMBULATORY_CARE_PROVIDER_SITE_OTHER): Payer: Self-pay | Admitting: General Surgery

## 2011-06-06 ENCOUNTER — Encounter (INDEPENDENT_AMBULATORY_CARE_PROVIDER_SITE_OTHER): Payer: Medicare Other | Admitting: General Surgery

## 2011-06-06 ENCOUNTER — Other Ambulatory Visit: Payer: Self-pay | Admitting: Dermatology

## 2011-07-01 ENCOUNTER — Other Ambulatory Visit: Payer: Self-pay | Admitting: Internal Medicine

## 2011-07-01 DIAGNOSIS — N289 Disorder of kidney and ureter, unspecified: Secondary | ICD-10-CM

## 2011-07-02 ENCOUNTER — Other Ambulatory Visit: Payer: Self-pay | Admitting: Internal Medicine

## 2011-07-02 ENCOUNTER — Ambulatory Visit
Admission: RE | Admit: 2011-07-02 | Discharge: 2011-07-02 | Disposition: A | Discharge status: Home / Self Care | Payer: Medicare Other | Source: Ambulatory Visit | Attending: Internal Medicine | Admitting: Internal Medicine

## 2011-07-02 DIAGNOSIS — N289 Disorder of kidney and ureter, unspecified: Secondary | ICD-10-CM

## 2011-07-03 ENCOUNTER — Ambulatory Visit
Admission: RE | Admit: 2011-07-03 | Discharge: 2011-07-03 | Disposition: A | Discharge status: Home / Self Care | Payer: Medicare Other | Source: Ambulatory Visit | Attending: Internal Medicine | Admitting: Internal Medicine

## 2011-07-03 DIAGNOSIS — N289 Disorder of kidney and ureter, unspecified: Secondary | ICD-10-CM

## 2012-07-02 ENCOUNTER — Telehealth: Payer: Self-pay | Admitting: Internal Medicine

## 2012-07-02 ENCOUNTER — Other Ambulatory Visit: Payer: Self-pay | Admitting: Orthopedic Surgery

## 2012-07-02 NOTE — Telephone Encounter (Signed)
Returned call.  Left message that form received and in process of being completed.  Call back with questions.

## 2012-07-02 NOTE — Telephone Encounter (Signed)
Wants to know if you recived fax for surgical clarence-faxed it on 06-26-12!

## 2012-07-03 ENCOUNTER — Telehealth: Payer: Self-pay | Admitting: Internal Medicine

## 2012-07-03 NOTE — Telephone Encounter (Signed)
Scheduled for back surgery on June 11th-Should he see Dr Debara Pickett before surgery?

## 2012-07-03 NOTE — Telephone Encounter (Signed)
Returned call.  Pt informed form faxed to surgeon with instructions to hold ASA for 7 days prior and restart afterwards.  Pt requested someone be available in case something happens during his procedure in the hospital.  Pt informed one of the providers is on call every day and that it may not be Dr. Debara Pickett.  Pt verbalized understanding and agreed w/ plan.

## 2012-07-13 ENCOUNTER — Encounter (HOSPITAL_COMMUNITY): Payer: Self-pay | Admitting: Pharmacy Technician

## 2012-07-15 ENCOUNTER — Encounter (HOSPITAL_COMMUNITY)
Admission: RE | Admit: 2012-07-15 | Discharge: 2012-07-15 | Disposition: A | Discharge status: Home / Self Care | Payer: Medicare Other | Source: Ambulatory Visit | Attending: Orthopedic Surgery | Admitting: Orthopedic Surgery

## 2012-07-15 ENCOUNTER — Ambulatory Visit (HOSPITAL_COMMUNITY)
Admission: RE | Admit: 2012-07-15 | Discharge: 2012-07-15 | Disposition: A | Discharge status: Home / Self Care | Payer: Medicare Other | Source: Ambulatory Visit | Attending: Orthopedic Surgery | Admitting: Orthopedic Surgery

## 2012-07-15 ENCOUNTER — Encounter (HOSPITAL_COMMUNITY): Payer: Self-pay

## 2012-07-15 DIAGNOSIS — Z01818 Encounter for other preprocedural examination: Secondary | ICD-10-CM | POA: Insufficient documentation

## 2012-07-15 DIAGNOSIS — J984 Other disorders of lung: Secondary | ICD-10-CM | POA: Insufficient documentation

## 2012-07-15 DIAGNOSIS — Z01812 Encounter for preprocedural laboratory examination: Secondary | ICD-10-CM | POA: Insufficient documentation

## 2012-07-15 DIAGNOSIS — M519 Unspecified thoracic, thoracolumbar and lumbosacral intervertebral disc disorder: Secondary | ICD-10-CM | POA: Insufficient documentation

## 2012-07-15 HISTORY — DX: Cardiac murmur, unspecified: R01.1

## 2012-07-15 HISTORY — DX: Myoneural disorder, unspecified: G70.9

## 2012-07-15 HISTORY — DX: Personal history of other infectious and parasitic diseases: Z86.19

## 2012-07-15 LAB — CBC WITH DIFFERENTIAL/PLATELET
Eosinophils Relative: 2 % (ref 0–5)
HCT: 41.3 % (ref 39.0–52.0)
Lymphocytes Relative: 16 % (ref 12–46)
Lymphs Abs: 1.2 10*3/uL (ref 0.7–4.0)
MCV: 90.4 fL (ref 78.0–100.0)
Monocytes Absolute: 0.6 10*3/uL (ref 0.1–1.0)
Monocytes Relative: 8 % (ref 3–12)
RBC: 4.57 MIL/uL (ref 4.22–5.81)
WBC: 7.4 10*3/uL (ref 4.0–10.5)

## 2012-07-15 LAB — COMPREHENSIVE METABOLIC PANEL
ALT: 14 U/L (ref 0–53)
BUN: 23 mg/dL (ref 6–23)
CO2: 29 mEq/L (ref 19–32)
Calcium: 9.6 mg/dL (ref 8.4–10.5)
Creatinine, Ser: 1.55 mg/dL — ABNORMAL HIGH (ref 0.50–1.35)
GFR calc Af Amer: 48 mL/min — ABNORMAL LOW (ref >90)
GFR calc non Af Amer: 41 mL/min — ABNORMAL LOW (ref >90)
Glucose, Bld: 114 mg/dL — ABNORMAL HIGH (ref 70–99)
Sodium: 139 mEq/L (ref 135–145)

## 2012-07-15 LAB — URINALYSIS, ROUTINE W REFLEX MICROSCOPIC
Glucose, UA: NEGATIVE mg/dL
Hgb urine dipstick: NEGATIVE
Protein, ur: NEGATIVE mg/dL

## 2012-07-15 LAB — TYPE AND SCREEN: ABO/RH(D): A POS

## 2012-07-15 LAB — SURGICAL PCR SCREEN: MRSA, PCR: NEGATIVE

## 2012-07-15 NOTE — Progress Notes (Signed)
Primary Physician - Dr. Lance Muss Cardiologist - Dr. Debara Pickett ekg within last 6 months at dr hilty's office - will request records

## 2012-07-15 NOTE — Pre-Procedure Instructions (Signed)
Gary [name]  07/15/2012   Your procedure is scheduled on:  Wednesday, June 11th  Report to [facility] at [place] to main entrance "A" and go to [place] elevators up to 3rd floor. Check in at short stay desk.  Call this number if you have problems the morning of surgery: [phone]   Remember:   Do not eat food or drink liquids after midnight.   Take these medicines the morning of surgery with A SIP OF WATER: Celexa, Flonase, Ativan, Neurontin, Vicodin if needed   Do not wear jewelry.  Do not wear lotions, powders, or perfume,deodorant.  Do not shave 48 hours prior to surgery. Men may shave face and neck.  Do not bring valuables to the hospital.  [facility] is not responsible for any belongings or valuables.  Contacts, dentures or bridgework may not be worn into surgery.  Leave suitcase in the car. After surgery it may be brought to your room.  For patients admitted to the hospital, checkout time is 11:00 AM the day of discharge.   Patients discharged the day of surgery will not be allowed to drive home.    Special Instructions: Shower using CHG 2 nights before surgery and the night before surgery.  If you shower the day of surgery use CHG.  Use special wash - you have one bottle of CHG for all showers.  You should use approximately 1/3 of the bottle for each shower.   Please read over the following fact sheets that you were given: Pain Booklet, Coughing and Deep Breathing, Blood Transfusion Information, MRSA Information and Surgical Site Infection Prevention

## 2012-07-21 MED ORDER — CEFAZOLIN SODIUM-DEXTROSE 2-3 GM-% IV SOLR
2.0000 g | INTRAVENOUS | Status: AC
  Administered 2012-07-22: 2 g via INTRAVENOUS
  Filled 2012-07-21: qty 50

## 2012-07-22 ENCOUNTER — Inpatient Hospital Stay (HOSPITAL_COMMUNITY): Payer: Medicare Other

## 2012-07-22 ENCOUNTER — Encounter (HOSPITAL_COMMUNITY)
Admission: RE | Disposition: A | Discharge status: Home Health | Payer: Self-pay | Source: Ambulatory Visit | Attending: Orthopedic Surgery

## 2012-07-22 ENCOUNTER — Encounter (HOSPITAL_COMMUNITY): Payer: Self-pay | Admitting: Anesthesiology

## 2012-07-22 ENCOUNTER — Encounter (HOSPITAL_COMMUNITY): Payer: Self-pay | Admitting: *Deleted

## 2012-07-22 ENCOUNTER — Inpatient Hospital Stay (HOSPITAL_COMMUNITY): Payer: Medicare Other | Admitting: Anesthesiology

## 2012-07-22 ENCOUNTER — Inpatient Hospital Stay (HOSPITAL_COMMUNITY)
Admission: RE | Admit: 2012-07-22 | Discharge: 2012-07-27 | DRG: 460 | Disposition: A | Discharge status: Home Health | Payer: Medicare Other | Source: Ambulatory Visit | Attending: Orthopedic Surgery | Admitting: Orthopedic Surgery

## 2012-07-22 DIAGNOSIS — Z860100 Personal history of colon polyps, unspecified: Secondary | ICD-10-CM

## 2012-07-22 DIAGNOSIS — K567 Ileus, unspecified: Secondary | ICD-10-CM

## 2012-07-22 DIAGNOSIS — R509 Fever, unspecified: Secondary | ICD-10-CM

## 2012-07-22 DIAGNOSIS — N400 Enlarged prostate without lower urinary tract symptoms: Secondary | ICD-10-CM

## 2012-07-22 DIAGNOSIS — Z981 Arthrodesis status: Secondary | ICD-10-CM

## 2012-07-22 DIAGNOSIS — Z800 Family history of malignant neoplasm of digestive organs: Secondary | ICD-10-CM

## 2012-07-22 DIAGNOSIS — K219 Gastro-esophageal reflux disease without esophagitis: Secondary | ICD-10-CM | POA: Diagnosis present

## 2012-07-22 DIAGNOSIS — D72829 Elevated white blood cell count, unspecified: Secondary | ICD-10-CM

## 2012-07-22 DIAGNOSIS — K560 Paralytic ileus: Secondary | ICD-10-CM | POA: Diagnosis not present

## 2012-07-22 DIAGNOSIS — Y831 Surgical operation with implant of artificial internal device as the cause of abnormal reaction of the patient, or of later complication, without mention of misadventure at the time of the procedure: Secondary | ICD-10-CM | POA: Diagnosis present

## 2012-07-22 DIAGNOSIS — R140 Abdominal distension (gaseous): Secondary | ICD-10-CM

## 2012-07-22 DIAGNOSIS — K929 Disease of digestive system, unspecified: Secondary | ICD-10-CM | POA: Diagnosis not present

## 2012-07-22 DIAGNOSIS — R195 Other fecal abnormalities: Secondary | ICD-10-CM

## 2012-07-22 DIAGNOSIS — K812 Acute cholecystitis with chronic cholecystitis: Secondary | ICD-10-CM

## 2012-07-22 DIAGNOSIS — K5730 Diverticulosis of large intestine without perforation or abscess without bleeding: Secondary | ICD-10-CM

## 2012-07-22 DIAGNOSIS — R4182 Altered mental status, unspecified: Secondary | ICD-10-CM | POA: Diagnosis not present

## 2012-07-22 DIAGNOSIS — A0472 Enterocolitis due to Clostridium difficile, not specified as recurrent: Secondary | ICD-10-CM

## 2012-07-22 DIAGNOSIS — I129 Hypertensive chronic kidney disease with stage 1 through stage 4 chronic kidney disease, or unspecified chronic kidney disease: Secondary | ICD-10-CM | POA: Diagnosis present

## 2012-07-22 DIAGNOSIS — G8929 Other chronic pain: Secondary | ICD-10-CM | POA: Diagnosis present

## 2012-07-22 DIAGNOSIS — N183 Chronic kidney disease, stage 3 unspecified: Secondary | ICD-10-CM

## 2012-07-22 DIAGNOSIS — K5731 Diverticulosis of large intestine without perforation or abscess with bleeding: Secondary | ICD-10-CM

## 2012-07-22 DIAGNOSIS — K9189 Other postprocedural complications and disorders of digestive system: Secondary | ICD-10-CM | POA: Diagnosis not present

## 2012-07-22 DIAGNOSIS — R400 Somnolence: Secondary | ICD-10-CM

## 2012-07-22 DIAGNOSIS — Y9210 Unspecified residential institution as the place of occurrence of the external cause: Secondary | ICD-10-CM | POA: Diagnosis present

## 2012-07-22 DIAGNOSIS — M48062 Spinal stenosis, lumbar region with neurogenic claudication: Principal | ICD-10-CM | POA: Diagnosis present

## 2012-07-22 DIAGNOSIS — E871 Hypo-osmolality and hyponatremia: Secondary | ICD-10-CM

## 2012-07-22 DIAGNOSIS — R112 Nausea with vomiting, unspecified: Secondary | ICD-10-CM | POA: Diagnosis not present

## 2012-07-22 DIAGNOSIS — E785 Hyperlipidemia, unspecified: Secondary | ICD-10-CM

## 2012-07-22 DIAGNOSIS — I252 Old myocardial infarction: Secondary | ICD-10-CM

## 2012-07-22 DIAGNOSIS — I10 Essential (primary) hypertension: Secondary | ICD-10-CM

## 2012-07-22 DIAGNOSIS — Z888 Allergy status to other drugs, medicaments and biological substances status: Secondary | ICD-10-CM

## 2012-07-22 DIAGNOSIS — K625 Hemorrhage of anus and rectum: Secondary | ICD-10-CM

## 2012-07-22 DIAGNOSIS — I2510 Atherosclerotic heart disease of native coronary artery without angina pectoris: Secondary | ICD-10-CM

## 2012-07-22 DIAGNOSIS — Z8249 Family history of ischemic heart disease and other diseases of the circulatory system: Secondary | ICD-10-CM

## 2012-07-22 DIAGNOSIS — F411 Generalized anxiety disorder: Secondary | ICD-10-CM

## 2012-07-22 DIAGNOSIS — Z882 Allergy status to sulfonamides status: Secondary | ICD-10-CM

## 2012-07-22 DIAGNOSIS — Z9089 Acquired absence of other organs: Secondary | ICD-10-CM

## 2012-07-22 HISTORY — PX: LUMBAR FUSION: SHX111

## 2012-07-22 SURGERY — POSTERIOR LUMBAR FUSION 1 LEVEL
Anesthesia: General | Site: Spine Lumbar | Wound class: Clean

## 2012-07-22 MED ORDER — GABAPENTIN 400 MG PO CAPS
400.0000 mg | ORAL_CAPSULE | Freq: Three times a day (TID) | ORAL | Status: DC
  Administered 2012-07-22 – 2012-07-24 (×6): 400 mg via ORAL
  Filled 2012-07-22 (×8): qty 1

## 2012-07-22 MED ORDER — LIDOCAINE HCL 4 % MT SOLN
OROMUCOSAL | Status: DC | PRN
  Administered 2012-07-22: 4 mL via TOPICAL

## 2012-07-22 MED ORDER — ONDANSETRON HCL 4 MG/2ML IJ SOLN
INTRAMUSCULAR | Status: DC | PRN
  Administered 2012-07-22: 4 mg via INTRAVENOUS

## 2012-07-22 MED ORDER — HYDROMORPHONE HCL PF 1 MG/ML IJ SOLN
INTRAMUSCULAR | Status: AC
  Filled 2012-07-22: qty 1

## 2012-07-22 MED ORDER — DIAZEPAM 5 MG PO TABS
5.0000 mg | ORAL_TABLET | Freq: Four times a day (QID) | ORAL | Status: DC | PRN
  Administered 2012-07-22 – 2012-07-23 (×2): 5 mg via ORAL
  Filled 2012-07-22 (×2): qty 1

## 2012-07-22 MED ORDER — SODIUM CHLORIDE 0.9 % IV SOLN
INTRAVENOUS | Status: DC
  Administered 2012-07-22 – 2012-07-25 (×6): via INTRAVENOUS

## 2012-07-22 MED ORDER — DOXAZOSIN MESYLATE 8 MG PO TABS
8.0000 mg | ORAL_TABLET | Freq: Every day | ORAL | Status: DC
Start: 2012-07-22 — End: 2012-07-27
  Administered 2012-07-22 – 2012-07-26 (×5): 8 mg via ORAL
  Filled 2012-07-22 (×6): qty 1

## 2012-07-22 MED ORDER — LIDOCAINE HCL (CARDIAC) 20 MG/ML IV SOLN
INTRAVENOUS | Status: DC | PRN
  Administered 2012-07-22: 100 mg via INTRAVENOUS

## 2012-07-22 MED ORDER — BUPIVACAINE-EPINEPHRINE 0.25% -1:200000 IJ SOLN
INTRAMUSCULAR | Status: DC | PRN
  Administered 2012-07-22: 6 mL

## 2012-07-22 MED ORDER — LACTATED RINGERS IV SOLN
INTRAVENOUS | Status: DC | PRN
  Administered 2012-07-22 (×2): via INTRAVENOUS

## 2012-07-22 MED ORDER — LORATADINE 10 MG PO TABS
10.0000 mg | ORAL_TABLET | Freq: Every day | ORAL | Status: DC
  Administered 2012-07-23 – 2012-07-24 (×2): 10 mg via ORAL
  Filled 2012-07-22 (×2): qty 1

## 2012-07-22 MED ORDER — TESTOSTERONE 50 MG/5GM (1%) TD GEL
5.0000 g | Freq: Every day | TRANSDERMAL | Status: DC
  Administered 2012-07-23 – 2012-07-27 (×5): 5 g via TRANSDERMAL
  Filled 2012-07-22 (×5): qty 5

## 2012-07-22 MED ORDER — SODIUM CHLORIDE 0.9 % IV SOLN: 250.0000 mL | INTRAVENOUS | Status: DC

## 2012-07-22 MED ORDER — TAMSULOSIN HCL 0.4 MG PO CAPS
0.4000 mg | ORAL_CAPSULE | Freq: Every day | ORAL | Status: DC
  Administered 2012-07-22 – 2012-07-26 (×5): 0.4 mg via ORAL
  Filled 2012-07-22 (×6): qty 1

## 2012-07-22 MED ORDER — THROMBIN 20000 UNITS EX SOLR
CUTANEOUS | Status: AC
  Filled 2012-07-22: qty 20000

## 2012-07-22 MED ORDER — POVIDONE-IODINE 7.5 % EX SOLN
Freq: Once | CUTANEOUS | Status: DC
  Filled 2012-07-22: qty 118

## 2012-07-22 MED ORDER — SENNOSIDES-DOCUSATE SODIUM 8.6-50 MG PO TABS: 1.0000 | ORAL_TABLET | Freq: Every evening | ORAL | Status: DC | PRN

## 2012-07-22 MED ORDER — SODIUM CHLORIDE 0.9 % IJ SOLN
3.0000 mL | Freq: Two times a day (BID) | INTRAMUSCULAR | Status: DC
  Administered 2012-07-23 – 2012-07-27 (×3): 3 mL via INTRAVENOUS

## 2012-07-22 MED ORDER — FUROSEMIDE 20 MG PO TABS
20.0000 mg | ORAL_TABLET | Freq: Every day | ORAL | Status: DC
  Administered 2012-07-22 – 2012-07-24 (×3): 20 mg via ORAL
  Filled 2012-07-22 (×3): qty 1

## 2012-07-22 MED ORDER — SUCCINYLCHOLINE CHLORIDE 20 MG/ML IJ SOLN
INTRAMUSCULAR | Status: DC | PRN
  Administered 2012-07-22: 100 mg via INTRAVENOUS

## 2012-07-22 MED ORDER — THROMBIN 20000 UNITS EX SOLR
CUTANEOUS | Status: DC | PRN
  Administered 2012-07-22: 10:00:00 via TOPICAL

## 2012-07-22 MED ORDER — NITROGLYCERIN 0.4 MG SL SUBL: 0.4000 mg | SUBLINGUAL_TABLET | SUBLINGUAL | Status: DC | PRN

## 2012-07-22 MED ORDER — FLUTICASONE PROPIONATE 50 MCG/ACT NA SUSP
2.0000 | Freq: Every day | NASAL | Status: DC
  Administered 2012-07-23 – 2012-07-27 (×5): 2 via NASAL
  Filled 2012-07-22: qty 16

## 2012-07-22 MED ORDER — ZOLPIDEM TARTRATE 5 MG PO TABS: 5.0000 mg | ORAL_TABLET | Freq: Every evening | ORAL | Status: DC | PRN

## 2012-07-22 MED ORDER — DOCUSATE SODIUM 100 MG PO CAPS
100.0000 mg | ORAL_CAPSULE | Freq: Two times a day (BID) | ORAL | Status: DC
  Administered 2012-07-22 – 2012-07-23 (×3): 100 mg via ORAL
  Filled 2012-07-22 (×5): qty 1

## 2012-07-22 MED ORDER — SIMVASTATIN 40 MG PO TABS
40.0000 mg | ORAL_TABLET | Freq: Every day | ORAL | Status: DC
  Administered 2012-07-22 – 2012-07-23 (×2): 40 mg via ORAL
  Filled 2012-07-22 (×3): qty 1

## 2012-07-22 MED ORDER — PHENYLEPHRINE HCL 10 MG/ML IJ SOLN
10.0000 mg | INTRAVENOUS | Status: DC | PRN
  Administered 2012-07-22: 20 ug/min via INTRAVENOUS

## 2012-07-22 MED ORDER — ACETAMINOPHEN 325 MG PO TABS
650.0000 mg | ORAL_TABLET | ORAL | Status: DC | PRN
  Administered 2012-07-25 – 2012-07-27 (×4): 650 mg via ORAL
  Filled 2012-07-22 (×4): qty 2

## 2012-07-22 MED ORDER — LORAZEPAM 0.5 MG PO TABS
0.5000 mg | ORAL_TABLET | Freq: Three times a day (TID) | ORAL | Status: DC
  Administered 2012-07-22 – 2012-07-24 (×4): 0.5 mg via ORAL
  Filled 2012-07-22 (×5): qty 1

## 2012-07-22 MED ORDER — MIDAZOLAM HCL 5 MG/5ML IJ SOLN
INTRAMUSCULAR | Status: DC | PRN
  Administered 2012-07-22: 2 mg via INTRAVENOUS

## 2012-07-22 MED ORDER — DIAZEPAM 5 MG PO TABS
ORAL_TABLET | ORAL | Status: AC
  Administered 2012-07-22: 5 mg via ORAL
  Filled 2012-07-22: qty 1

## 2012-07-22 MED ORDER — 0.9 % SODIUM CHLORIDE (POUR BTL) OPTIME
TOPICAL | Status: DC | PRN
  Administered 2012-07-22: 1000 mL

## 2012-07-22 MED ORDER — ALUM & MAG HYDROXIDE-SIMETH 200-200-20 MG/5ML PO SUSP
30.0000 mL | Freq: Four times a day (QID) | ORAL | Status: DC | PRN
  Administered 2012-07-22 – 2012-07-23 (×2): 30 mL via ORAL
  Filled 2012-07-22 (×2): qty 30

## 2012-07-22 MED ORDER — SODIUM CHLORIDE 0.9 % IJ SOLN: 3.0000 mL | INTRAMUSCULAR | Status: DC | PRN

## 2012-07-22 MED ORDER — CITALOPRAM HYDROBROMIDE 40 MG PO TABS
40.0000 mg | ORAL_TABLET | Freq: Every day | ORAL | Status: DC
  Administered 2012-07-23 – 2012-07-24 (×2): 40 mg via ORAL
  Filled 2012-07-22 (×3): qty 1

## 2012-07-22 MED ORDER — ONDANSETRON HCL 4 MG/2ML IJ SOLN
4.0000 mg | INTRAMUSCULAR | Status: DC | PRN
  Administered 2012-07-23 – 2012-07-25 (×6): 4 mg via INTRAVENOUS
  Filled 2012-07-22 (×7): qty 2

## 2012-07-22 MED ORDER — BUPIVACAINE-EPINEPHRINE PF 0.25-1:200000 % IJ SOLN
INTRAMUSCULAR | Status: AC
  Filled 2012-07-22: qty 30

## 2012-07-22 MED ORDER — EPHEDRINE SULFATE 50 MG/ML IJ SOLN
INTRAMUSCULAR | Status: DC | PRN
  Administered 2012-07-22: 10 mg via INTRAVENOUS

## 2012-07-22 MED ORDER — OXYCODONE-ACETAMINOPHEN 5-325 MG PO TABS
1.0000 | ORAL_TABLET | ORAL | Status: DC | PRN
  Administered 2012-07-22 (×2): 2 via ORAL
  Administered 2012-07-23: 1 via ORAL
  Filled 2012-07-22: qty 1
  Filled 2012-07-22 (×2): qty 2
  Filled 2012-07-22: qty 1

## 2012-07-22 MED ORDER — PHENOL 1.4 % MT LIQD: 1.0000 | OROMUCOSAL | Status: DC | PRN

## 2012-07-22 MED ORDER — HYDROMORPHONE HCL PF 1 MG/ML IJ SOLN: 0.2500 mg | INTRAMUSCULAR | Status: DC | PRN

## 2012-07-22 MED ORDER — SUFENTANIL CITRATE 50 MCG/ML IV SOLN
INTRAVENOUS | Status: DC | PRN
  Administered 2012-07-22: 10 ug via INTRAVENOUS
  Administered 2012-07-22 (×2): 5 ug via INTRAVENOUS
  Administered 2012-07-22: 20 ug via INTRAVENOUS
  Administered 2012-07-22: 10 ug via INTRAVENOUS

## 2012-07-22 MED ORDER — CEFAZOLIN SODIUM 1-5 GM-% IV SOLN
1.0000 g | Freq: Three times a day (TID) | INTRAVENOUS | Status: AC
  Administered 2012-07-22 – 2012-07-23 (×2): 1 g via INTRAVENOUS
  Filled 2012-07-22 (×2): qty 50

## 2012-07-22 MED ORDER — FLEET ENEMA 7-19 GM/118ML RE ENEM: 1.0000 | ENEMA | Freq: Once | RECTAL | Status: AC | PRN

## 2012-07-22 MED ORDER — ACETAMINOPHEN 650 MG RE SUPP: 650.0000 mg | RECTAL | Status: DC | PRN

## 2012-07-22 MED ORDER — PROPOFOL INFUSION 10 MG/ML OPTIME
INTRAVENOUS | Status: DC | PRN
  Administered 2012-07-22: 25 ug/kg/min via INTRAVENOUS

## 2012-07-22 MED ORDER — MENTHOL 3 MG MT LOZG: 1.0000 | LOZENGE | OROMUCOSAL | Status: DC | PRN

## 2012-07-22 MED ORDER — BISACODYL 5 MG PO TBEC: 5.0000 mg | DELAYED_RELEASE_TABLET | Freq: Every day | ORAL | Status: DC | PRN

## 2012-07-22 MED ORDER — MORPHINE SULFATE 2 MG/ML IJ SOLN
1.0000 mg | INTRAMUSCULAR | Status: DC | PRN
  Administered 2012-07-22 – 2012-07-23 (×4): 2 mg via INTRAVENOUS
  Filled 2012-07-22 (×5): qty 1

## 2012-07-22 MED ORDER — PROPOFOL 10 MG/ML IV BOLUS
INTRAVENOUS | Status: DC | PRN
  Administered 2012-07-22: 180 mg via INTRAVENOUS

## 2012-07-22 MED ORDER — GLYCOPYRROLATE 0.2 MG/ML IJ SOLN
INTRAMUSCULAR | Status: DC | PRN
  Administered 2012-07-22: 0.2 mg via INTRAVENOUS
  Administered 2012-07-22: .2 mg via INTRAVENOUS

## 2012-07-22 MED ORDER — HYDROMORPHONE HCL PF 1 MG/ML IJ SOLN
0.2500 mg | INTRAMUSCULAR | Status: DC | PRN
  Administered 2012-07-22 (×4): 0.5 mg via INTRAVENOUS

## 2012-07-22 SURGICAL SUPPLY — 82 items
BENZOIN TINCTURE PRP APPL 2/3 (GAUZE/BANDAGES/DRESSINGS) ×2 IMPLANT
BLADE SURG ROTATE 9660 (MISCELLANEOUS) ×2 IMPLANT
BOLT DECADE 5.5X40 (Bolt) ×2 IMPLANT
BOLT SPNL LRG 45X5.5XPLAT NS (Screw) ×1 IMPLANT
BUR ROUND PRECISION 4.0 (BURR) ×2 IMPLANT
CARTRIDGE OIL MAESTRO DRILL (MISCELLANEOUS) IMPLANT
CHERRY SPONGES ×2 IMPLANT
CLOTH BEACON ORANGE TIMEOUT ST (SAFETY) ×2 IMPLANT
CLSR STERI-STRIP ANTIMIC 1/2X4 (GAUZE/BANDAGES/DRESSINGS) ×2 IMPLANT
CONT SPEC STER OR (MISCELLANEOUS) ×2 IMPLANT
CORDS BIPOLAR (ELECTRODE) ×2 IMPLANT
COROENT XL-W 8X22X50 (Orthopedic Implant) ×2 IMPLANT
COVER SURGICAL LIGHT HANDLE (MISCELLANEOUS) ×2 IMPLANT
DIFFUSER DRILL AIR PNEUMATIC (MISCELLANEOUS) ×2 IMPLANT
DRAIN CHANNEL 15F RND FF W/TCR (WOUND CARE) IMPLANT
DRAPE C-ARM 42X72 X-RAY (DRAPES) ×2 IMPLANT
DRAPE INCISE IOBAN 66X45 STRL (DRAPES) ×2 IMPLANT
DRAPE ORTHO SPLIT 77X108 STRL (DRAPES) ×1
DRAPE POUCH INSTRU U-SHP 10X18 (DRAPES) ×2 IMPLANT
DRAPE SURG 17X23 STRL (DRAPES) ×6 IMPLANT
DRAPE SURG ORHT 6 SPLT 77X108 (DRAPES) ×1 IMPLANT
DURAPREP 26ML APPLICATOR (WOUND CARE) ×2 IMPLANT
ELECT BLADE 4.0 EZ CLEAN MEGAD (MISCELLANEOUS) ×2
ELECT CAUTERY BLADE 6.4 (BLADE) ×2 IMPLANT
ELECT REM PT RETURN 9FT ADLT (ELECTROSURGICAL) ×2
ELECTRODE BLDE 4.0 EZ CLN MEGD (MISCELLANEOUS) ×1 IMPLANT
ELECTRODE REM PT RTRN 9FT ADLT (ELECTROSURGICAL) ×1 IMPLANT
EVACUATOR SILICONE 100CC (DRAIN) IMPLANT
GAUZE SPONGE 4X4 16PLY XRAY LF (GAUZE/BANDAGES/DRESSINGS) ×2 IMPLANT
GLOVE BIO SURGEON STRL SZ7 (GLOVE) ×4 IMPLANT
GLOVE BIO SURGEON STRL SZ8 (GLOVE) ×2 IMPLANT
GLOVE BIOGEL PI IND STRL 7.5 (GLOVE) ×1 IMPLANT
GLOVE BIOGEL PI IND STRL 8 (GLOVE) ×1 IMPLANT
GLOVE BIOGEL PI INDICATOR 7.5 (GLOVE) ×1
GLOVE BIOGEL PI INDICATOR 8 (GLOVE) ×1
GLOVE SURG SS PI 8.0 STRL IVOR (GLOVE) ×4 IMPLANT
GOWN STRL NON-REIN LRG LVL3 (GOWN DISPOSABLE) ×4 IMPLANT
GOWN STRL REIN XL XLG (GOWN DISPOSABLE) ×4 IMPLANT
IV CATH 14GX2 1/4 (CATHETERS) ×2 IMPLANT
KIT BASIN OR (CUSTOM PROCEDURE TRAY) ×2 IMPLANT
KIT DILATOR XLIF 5 (KITS) ×1 IMPLANT
KIT MAXCESS (KITS) ×2 IMPLANT
KIT NEEDLE NVM5 EMG ELECT (KITS) ×1 IMPLANT
KIT NEEDLE NVM5 EMG ELECTRODE (KITS) ×1
KIT POSITION SURG JACKSON T1 (MISCELLANEOUS) IMPLANT
KIT ROOM TURNOVER OR (KITS) ×2 IMPLANT
KIT XLIF (KITS) ×1
MARKER SKIN DUAL TIP RULER LAB (MISCELLANEOUS) IMPLANT
NEEDLE BONE MARROW 8GX6 FENEST (NEEDLE) IMPLANT
NEEDLE HYPO 25GX1X1/2 BEV (NEEDLE) ×2 IMPLANT
NEEDLE SPNL 18GX3.5 QUINCKE PK (NEEDLE) ×4 IMPLANT
NS IRRIG 1000ML POUR BTL (IV SOLUTION) ×2 IMPLANT
OIL CARTRIDGE MAESTRO DRILL (MISCELLANEOUS)
PACK LAMINECTOMY ORTHO (CUSTOM PROCEDURE TRAY) ×2 IMPLANT
PACK UNIVERSAL I (CUSTOM PROCEDURE TRAY) ×2 IMPLANT
PAD ARMBOARD 7.5X6 YLW CONV (MISCELLANEOUS) ×4 IMPLANT
PATTIES SURGICAL .5 X1 (DISPOSABLE) ×2 IMPLANT
PATTIES SURGICAL .5X1.5 (GAUZE/BANDAGES/DRESSINGS) IMPLANT
PLATE 2H DECADE 8MM (Plate) ×2 IMPLANT
PUTTY BONE DBX 5CC MIX (Putty) ×2 IMPLANT
SCREW 45MM (Screw) ×1 IMPLANT
SPONGE GAUZE 4X4 12PLY (GAUZE/BANDAGES/DRESSINGS) ×2 IMPLANT
SPONGE INTESTINAL PEANUT (DISPOSABLE) ×2 IMPLANT
SPONGE SURGIFOAM ABS GEL 100 (HEMOSTASIS) ×2 IMPLANT
STAPLER VISISTAT 35W (STAPLE) ×2 IMPLANT
STRIP CLOSURE SKIN 1/2X4 (GAUZE/BANDAGES/DRESSINGS) ×4 IMPLANT
SURGIFLO TRUKIT (HEMOSTASIS) IMPLANT
SUT MNCRL AB 4-0 PS2 18 (SUTURE) ×4 IMPLANT
SUT VIC AB 0 CT1 18XCR BRD 8 (SUTURE) ×1 IMPLANT
SUT VIC AB 0 CT1 8-18 (SUTURE) ×1
SUT VIC AB 1 CT1 18XCR BRD 8 (SUTURE) ×2 IMPLANT
SUT VIC AB 1 CT1 8-18 (SUTURE) ×2
SUT VIC AB 2-0 CT2 18 VCP726D (SUTURE) ×2 IMPLANT
SYR 20CC LL (SYRINGE) ×2 IMPLANT
SYR BULB IRRIGATION 50ML (SYRINGE) ×2 IMPLANT
SYR CONTROL 10ML LL (SYRINGE) ×2 IMPLANT
TAPE CLOTH SURG 4X10 WHT LF (GAUZE/BANDAGES/DRESSINGS) ×2 IMPLANT
TOWEL OR 17X24 6PK STRL BLUE (TOWEL DISPOSABLE) ×2 IMPLANT
TOWEL OR 17X26 10 PK STRL BLUE (TOWEL DISPOSABLE) ×2 IMPLANT
TRAY FOLEY CATH 14FR (SET/KITS/TRAYS/PACK) ×2 IMPLANT
WATER STERILE IRR 1000ML POUR (IV SOLUTION) ×4 IMPLANT
YANKAUER SUCT BULB TIP NO VENT (SUCTIONS) ×2 IMPLANT

## 2012-07-22 NOTE — H&P (Signed)
PREOPERATIVE H&P  Chief Complaint: bilateral leg pain  HPI: Gary [name] is a 79 y.o. male who presents with bilateral leg pain secondary to ASD at the L2/3 level  Past Medical History  Diagnosis Date  . Hypertension   . Hemorrhoids   . Chronic kidney disease   . Kidney stones   . Dyslipidemia   . Diverticulosis   . Colon polyps   . Blood transfusion   . Anemia   . GI bleeding after 07/2009    "triggered by Plavix & ASA; from my diverticulosis"  . GERD (gastroesophageal reflux disease)   . Headache(784.0)     occasionally  . Anxiety   . Arthritis     "hips and lower back"  . Chronic back pain greater than 3 months duration   . Diarrhea   . Arthritis pain   . Cholecystitis   . Heart attack 07/2009  . Coronary artery disease   . History of shingles   . Heart murmur   . Neuromuscular disorder    Past Surgical History  Procedure Laterality Date  . Colon surgery      COLONOSCOPY  . Subtotal colectomy  01/24/2010  . Eye surgery  1942    "right eye was crossed; they  corrected it"  . Posterior fusion lumbar spine  08/1995    "bottom 4 vertebra"  . Back surgery  1997  . Cardiac catheterization  07/2009  . Cholecystectomy  04/12/2011    Procedure: CHOLECYSTECTOMY;  Surgeon: Adin Hector, MD;  Location: [place];  Service: General;  Laterality: N/A;  . Cholecystectomy  04/12/2011    Procedure: LAPAROSCOPIC CHOLECYSTECTOMY;  Surgeon: Adin Hector, MD;  Location: [place];  Service: General;  Laterality: N/A;  converted to open   History   Social History  . Marital Status: Married    Spouse Name: N/A    Number of Children: N/A  . Years of Education: N/A   Social History Main Topics  . Smoking status: Never Smoker   . Smokeless tobacco: Never Used  . Alcohol Use: No  . Drug Use: No  . Sexually Active: Not Currently   Other Topics Concern  . None   Social History Narrative   Daily caffeine use.   Family History  Problem Relation Age of Onset  . Heart disease  Father   . Colon cancer Neg Hx   . Pancreatic cancer Mother   . Cancer Mother     pancreatic   Allergies  Allergen Reactions  . Naproxen Rash  . Clopidogrel Bisulfate Other (See Comments)    H/O diverticulitis; prone to rectal bleeding.  Plavix complicated this.  jkl  . Sulfonamide Derivatives Hives  . Tape Itching and Rash    Adhesive tape   Prior to Admission medications   Medication Sig Start Date End Date Taking? Authorizing Provider  aspirin EC 81 MG tablet Take 81 mg by mouth daily.   Yes Historical Provider, MD  citalopram (CELEXA) 40 MG tablet Take 40 mg by mouth daily.   Yes Historical Provider, MD  doxazosin (CARDURA) 8 MG tablet Take 8 mg by mouth at bedtime.   Yes Historical Provider, MD  fexofenadine (ALLEGRA) 180 MG tablet Take 180 mg by mouth daily.    Yes Historical Provider, MD  fluticasone (FLONASE) 50 MCG/ACT nasal spray 2 sprays by Nasal route daily.     Yes Historical Provider, MD  furosemide (LASIX) 20 MG tablet Take 20 mg by mouth daily. 03/30/11  Yes  Barton Dubois, MD  gabapentin (NEURONTIN) 400 MG capsule Take 400 mg by mouth 3 (three) times daily.    Yes Historical Provider, MD  HYDROcodone-acetaminophen (NORCO/VICODIN) 5-325 MG per tablet Take 1 tablet by mouth every 6 (six) hours as needed for pain.   Yes Historical Provider, MD  LORazepam (ATIVAN) 0.5 MG tablet Take 0.5 mg by mouth every 8 (eight) hours.   Yes Historical Provider, MD  nitroGLYCERIN (NITROSTAT) 0.4 MG SL tablet Place 0.4 mg under the tongue every 5 (five) minutes as needed for chest pain. For chest pain.   Yes Historical Provider, MD  simvastatin (ZOCOR) 40 MG tablet Take 40 mg by mouth at bedtime.    Yes Historical Provider, MD  Tamsulosin HCl (FLOMAX) 0.4 MG CAPS Take 0.4 mg by mouth daily after supper. 02/04/11  Yes Charlynne Cousins, MD  testosterone (ANDROGEL) 50 MG/5GM GEL Place 5 g onto the skin daily.    Yes Historical Provider, MD  traMADol-acetaminophen (ULTRACET) 37.5-325 MG per  tablet Take 2 tablets by mouth every 8 (eight) hours as needed for pain. May take every 6 hours is needed for pain.   Yes Historical Provider, MD     All other systems have been reviewed and were otherwise negative with the exception of those mentioned in the HPI and as above.  Physical Exam: Filed Vitals:   07/22/12 0626  BP: 138/75  Pulse: 72  Temp: 97.7 F (36.5 C)  Resp: 20    General: Alert, no acute distress Cardiovascular: No pedal edema Respiratory: No cyanosis, no use of accessory musculature Skin: No lesions in the area of chief complaint Neurologic: Sensation intact distally Psychiatric: Patient is competent for consent with normal mood and affect Lymphatic: No axillary or cervical lymphadenopathy   Assessment/Plan: Bilateral leg pain Plan for Procedure(s): XLIF 1 LEVEL, L2/3   Sinclair Ship, MD 07/22/2012 8:26 AM

## 2012-07-22 NOTE — Anesthesia Preprocedure Evaluation (Signed)
Anesthesia Evaluation  Patient identified by MRN, date of birth, ID band Patient awake    Reviewed: Allergy & Precautions, H&P , NPO status , Patient's Chart, lab work & pertinent test results  Airway Mallampati: II      Dental   Pulmonary neg pulmonary ROS,  breath sounds clear to auscultation        Cardiovascular hypertension, + CAD and + Past MI Rhythm:Regular Rate:Normal     Neuro/Psych    GI/Hepatic Neg liver ROS, GERD-  ,  Endo/Other  negative endocrine ROS  Renal/GU Renal disease     Musculoskeletal   Abdominal   Peds  Hematology   Anesthesia Other Findings   Reproductive/Obstetrics                           Anesthesia Physical Anesthesia Plan  ASA: III  Anesthesia Plan: General   Post-op Pain Management:    Induction: Intravenous  Airway Management Planned: Oral ETT  Additional Equipment:   Intra-op Plan:   Post-operative Plan: Extubation in OR  Informed Consent: I have reviewed the patients History and Physical, chart, labs and discussed the procedure including the risks, benefits and alternatives for the proposed anesthesia with the patient or authorized representative who has indicated his/her understanding and acceptance.   Dental advisory given  Plan Discussed with: CRNA, Anesthesiologist and Surgeon  Anesthesia Plan Comments:         Anesthesia Quick Evaluation

## 2012-07-22 NOTE — Anesthesia Postprocedure Evaluation (Signed)
  Anesthesia Post-op Note  Patient: Gary [name]  Procedure(s) Performed: Procedure(s) with comments: POSTERIOR LATERAL  LUMBAR FUSION 1 LEVEL (N/A) - Lumbar 2-3 lateral interbody fusion with instrumentation and allograft  Patient Location: PACU  Anesthesia Type:General  Level of Consciousness: awake  Airway and Oxygen Therapy: Patient Spontanous Breathing  Post-op Pain: mild  Post-op Assessment: Post-op Vital signs reviewed  Post-op Vital Signs: Reviewed  Complications: No apparent anesthesia complications

## 2012-07-22 NOTE — Anesthesia Procedure Notes (Signed)
Procedure Name: Intubation Date/Time: 07/22/2012 8:36 AM Performed by: Izora Gala Pre-anesthesia Checklist: Patient identified, Emergency Drugs available, Suction available and Patient being monitored Patient Re-evaluated:Patient Re-evaluated prior to inductionOxygen Delivery Method: Circle system utilized Preoxygenation: Pre-oxygenation with 100% oxygen Intubation Type: IV induction Laryngoscope Size: Miller and 3 Grade View: Grade II Tube type: Oral Tube size: 8.5 mm Airway Equipment and Method: Stylet and LTA kit utilized Placement Confirmation: ETT inserted through vocal cords under direct vision,  positive ETCO2 and breath sounds checked- equal and bilateral Secured at: 22 cm Tube secured with: Tape Dental Injury: Teeth and Oropharynx as per pre-operative assessment

## 2012-07-22 NOTE — Progress Notes (Signed)
UR COMPLETED  

## 2012-07-22 NOTE — Transfer of Care (Signed)
Immediate Anesthesia Transfer of Care Note  Patient: Gary [name]  Procedure(s) Performed: Procedure(s) with comments: POSTERIOR LATERAL  LUMBAR FUSION 1 LEVEL (N/A) - Lumbar 2-3 lateral interbody fusion with instrumentation and allograft  Patient Location: PACU  Anesthesia Type:General  Level of Consciousness: awake, sedated and patient cooperative  Airway & Oxygen Therapy: Patient Spontanous Breathing and Patient connected to face mask oxygen  Post-op Assessment: Report given to PACU RN and Post -op Vital signs reviewed and stable  Post vital signs: Reviewed and stable  Complications: No apparent anesthesia complications

## 2012-07-23 ENCOUNTER — Other Ambulatory Visit: Payer: Self-pay | Admitting: Internal Medicine

## 2012-07-23 MED ORDER — BISACODYL 10 MG RE SUPP
10.0000 mg | Freq: Every day | RECTAL | Status: DC | PRN
  Administered 2012-07-23: 10 mg via RECTAL
  Filled 2012-07-23: qty 1

## 2012-07-23 MED ORDER — WHITE PETROLATUM GEL
Status: AC
  Filled 2012-07-23: qty 5

## 2012-07-23 NOTE — Op Note (Signed)
NAMEBRONC, BUCKBEE NO.:  [id number]  MEDICAL RECORD NO.:  [id number]  LOCATION:  [id number]                        FACILITY:  [facility]  PHYSICIAN:  Phylliss Bob, MD      DATE OF BIRTH:  02-Sep-1932  DATE OF PROCEDURE:  07/22/2012                              OPERATIVE REPORT   PREOPERATIVE DIAGNOSIS: 1. Adjacent segment disease involving the L2-3 level, the level     adjacent to the patient's previous fusion. 2. Neurogenic claudication. 3. L2-3 spinal stenosis.  POSTOPERATIVE DIAGNOSIS: 1. Adjacent segment disease involving the L2-3 level, the level     adjacent to the patient's previous fusion. 2. Neurogenic claudication. 3. L2-3 spinal stenosis.  PROCEDURES: 1. L2-3 interbody fusion via left-sided lateral approach. 2. Placement of anterior instrumentation, L2-3. 3. Insertion of interbody device x1 (8 x 22 mm parallel Nu[employer]     interbody cage). 4. Use of morselized allograft. 5. Intraoperative use of fluoroscopy.  SURGEON:  Phylliss Bob, MD  ASSISTANTS:  Pricilla Holm, PA-C  ANESTHESIA:  General endotracheal anesthesia.  COMPLICATIONS:  None.  DISPOSITION:  Stable.  ESTIMATED BLOOD LOSS:  Minimal.  INDICATIONS FOR PROCEDURE:  Briefly, Mr. Magnone is a very pleasant 79- year-old male, who did present to me with signs and symptoms associated with neurogenic claudication.  I did review a CAT scan of the patient's lumbar spine, which was notable for obvious adjacent segment disease, the level above the patient's previous fusion from L3-S1.  The patient did have a central disk protrusion at the L1-2 level, however, I did feel that the L2-3 level was the symptomatic level.  The patient failed multiple forms of conservative care and we did therefore have a discussion regarding going forward with a lateral interbody fusion, and the procedure as reflected above.  The patient fully understood the risks and limitations of the procedure as outlined in my  preoperative note.  OPERATIVE DETAILS:  On July 22, 2012, the patient was brought to surgery and general endotracheal anesthesia was administered.  The patient was placed into the lateral decubitus position with the left side up.  All bony prominences were meticulously padded.  I did use neurologic monitoring throughout the procedure.  The monitoring leads were placed by me.  I did flex the bed, so as to move the ribs out of the way, and to help optimize the exposure of the L2-3 interspace.  Time-out procedure was performed.  I then made a transverse incision in line with the L2-3 interspace.  The external and internal oblique musculature was bluntly dissected.  The transversalis fascia was identified and entered. I did use a series of peanuts to sweep the retroperitoneal space anteriorly.  The stylus was readily noted.  I then proceeded with advancing the initial dilator overlie over the L2-3 interspace.  I did obtain lateral and AP fluoroscopic views to confirm appropriate positioning of the initial dilator.  The guidewire was then advanced across the disk space.  I then subsequently dilated and did use neurologic monitoring while dilating and there was no triggered EMG response below 15 milliamps noted.  I then secured a self-retaining retractor to the bed.  The retractor was gently opened and  I did use a monopolar EMG probe and there was no neurologic structures identified in the field of the exposure.  I then used a #15-blade knife to perform an annulotomy.  I then went forward with a thorough diskectomy using a series of Kerrison punches in addition to curettes, rasps as well as pituitary rongeurs.  I was very pleased with the diskectomy that I was able to accomplished.  I then placed a series of trials and I did dilate up to an 8 mm x 22 mm trial.  I did note excellent correction when advancing the trial.  Of note, the contralateral anulus was entered, so as to help optimize the  restoration of the patient's coronal alignment. I then packed an 8 x 22 parallel interbody graft with DBX mix.  This was tamped into position in the usual fashion.  I was very pleased with the appearance of the implant on both AP and lateral radiographs.  I then placed a 2 hole lateral plate spanning the [id number] interspace.  I placed a 40 mm screw into the L2 vertebral body and a 45 mm screw into the L3 vertebral body.  I did secure the locking mechanism for the screws.  I then flattened out of bed, so as to restore the coronal alignment.  I then locked the plate into this position.  I was extremely pleased with the height restoration and AP and lateral images.  I then copiously irrigated the wound.  The fascia was then closed using #1 Vicryl.  The subcutaneous layer was closed using 2-0 Vicryl.  The skin was closed using 3-0 Monocryl.  Benzoin and Steri-Strips were applied followed by sterile dressing.  All instrument counts were correct at the termination of the procedure.  Of note, there is no abnormal EMG activity noted throughout the entirety of the procedure.  Of note, Pricilla Holm was my assistant throughout the entirety of the procedure, and did aided in essential retraction and suctioning needed throughout the surgery.  She did also aided in placement of the implant and the anterior hardware.  The patient was then awoken from general endotracheal anesthesia and transferred to recovery in stable condition.     Phylliss Bob, MD     MD/MEDQ  D:  07/22/2012  T:  07/23/2012  Job:  [id number]  cc:   Dr. Nyoka Cowden

## 2012-07-23 NOTE — Evaluation (Signed)
Occupational Therapy Evaluation Patient Details Name: Gary [name] MRN: [id number] DOB: Jun 28, 1932 Today's Date: 07/23/2012 Time: [id number] OT Time Calculation (min): 42 min  OT Assessment / Plan / Recommendation Clinical Impression   This 79 y.o. Male amitted for XLIF L2-3.  Pt. Groggy this pm, likely due to meds and moving slowly.  He and wife were initially leaning toward SNF, but now both state they prefer to discharge home with [facility]. Wife states she can provide adequate level of assist at discharge.  Pt will benefit from OT for the below listed deficits,  to maximize safety and independence with BADLs to allow him to return home with wife at supervision to min a level.  He will likely need AE for LB ADLs    OT Assessment  Patient needs continued OT Services    Follow Up Recommendations  Home health OT;Supervision/Assistance - 24 hour    Barriers to Discharge None    Equipment Recommendations  3 in 1 bedside comode    Recommendations for Other Services    Frequency  Min 2X/week    Precautions / Restrictions Precautions Precautions: Back Precaution Booklet Issued: Yes (comment) Precaution Comments: educated pt. and wife on back precautions and log rolling technique Required Braces or Orthoses: Spinal Brace Spinal Brace: Thoracolumbosacral orthotic Restrictions Weight Bearing Restrictions: No Other Position/Activity Restrictions: back precautions       ADL  Eating/Feeding: Independent Where Assessed - Eating/Feeding: Chair Grooming: Wash/dry hands;Wash/dry face;Teeth care;Set up Where Assessed - Grooming: Supported sitting Upper Body Bathing: Minimal assistance Where Assessed - Upper Body Bathing: Supported sitting Lower Body Bathing: Moderate assistance Where Assessed - Lower Body Bathing: Supported sit to stand Upper Body Dressing: Moderate assistance Where Assessed - Upper Body Dressing: Unsupported sitting Lower Body Dressing: +1 Total assistance Where Assessed -  Lower Body Dressing: Supported sit to stand Toilet Transfer: Minimal assistance Toilet Transfer Method: Sit to stand;Stand pivot Science writer: Bedside commode Toileting - Clothing Manipulation and Hygiene: +1 Total assistance Where Assessed - Camera operator Manipulation and Hygiene: Standing Equipment Used: Back brace;Rolling walker Transfers/Ambulation Related to ADLs: min a  ADL Comments: Pt able to recall 2/3 back precautions.  Pt and wife instructed in back precautions and TLSO.  Pt unable to access feet for LB ADLs - will likely need AE    OT Diagnosis: Generalized weakness;Acute pain  OT Problem List: Decreased strength;Decreased activity tolerance;Impaired balance (sitting and/or standing);Decreased knowledge of use of DME or AE;Decreased knowledge of precautions;Pain OT Treatment Interventions: Self-care/ADL training;DME and/or AE instruction;Therapeutic activities;Patient/family education   OT Goals Acute Rehab OT Goals OT Goal Formulation: With patient/family Time For Goal Achievement: 07/30/12 Potential to Achieve Goals: Good ADL Goals Pt Will Perform Grooming: with supervision;Standing at sink ADL Goal: Grooming - Progress: Goal set today Pt Will Perform Upper Body Bathing: with supervision;Sitting, chair;Sitting, edge of bed ADL Goal: Upper Body Bathing - Progress: Goal set today Pt Will Perform Lower Body Bathing: with supervision;Sit to stand from chair;Sit to stand from bed ADL Goal: Lower Body Bathing - Progress: Goal set today Pt Will Perform Lower Body Dressing: with supervision;Sit to stand from chair;Sit to stand from bed;with adaptive equipment ADL Goal: Lower Body Dressing - Progress: Goal set today Pt Will Transfer to Toilet: with supervision;Ambulation;with DME ADL Goal: Toilet Transfer - Progress: Goal set today Pt Will Perform Toileting - Clothing Manipulation: with supervision;Sitting on 3-in-1 or toilet;with adaptive equipment;Standing ADL  Goal: Toileting - Clothing Manipulation - Progress: Goal set today Pt Will Perform Toileting -  Hygiene: with supervision;Sit to stand from 3-in-1/toilet;with adaptive equipment ADL Goal: Toileting - Hygiene - Progress: Goal set today Pt Will Perform Tub/Shower Transfer: with min assist;Ambulation;with DME ADL Goal: Tub/Shower Transfer - Progress: Goal set today Additional ADL Goal #1: Wife will be independent with assisting pt with brace and ADLs as needed ADL Goal: Additional Goal #1 - Progress: Goal set today  Visit Information  Last OT Received On: 07/23/12 Assistance Needed: +1    Subjective Data  Subjective: "I'm groggy.  I think it's the medication" Patient Stated Goal: To get better   Prior Functioning     Home Living Lives With: Spouse Available Help at Discharge: Family;Available 24 hours/day (wife reports she cannot manage him right now) Type of Home: House Home Access: Stairs to enter [employer] of Steps: 1 Entrance Stairs-Rails: None Home Layout: One level Bathroom Shower/Tub: Chiropodist: Standard Home Adaptive Equipment: Bedside commode/3-in-1;Walker - rolling;Tub transfer bench;Straight cane Prior Function Level of Independence: Independent with assistive device(s) (used cane as needed PTA) Able to Take Stairs?: Yes Driving: Yes Vocation: Retired Corporate investment banker: No difficulties         Vision/Perception     Solicitor Arousal/Alertness: Awake/alert Behavior During Therapy: Flat affect Overall Cognitive Status: Within Functional Limits for tasks assessed    Extremity/Trunk Assessment Right Upper Extremity Assessment RUE ROM/Strength/Tone: WFL for tasks assessed RUE Coordination: WFL - gross/fine motor Left Upper Extremity Assessment LUE ROM/Strength/Tone: WFL for tasks assessed LUE Coordination: WFL - gross/fine motor Right Lower Extremity Assessment RLE ROM/Strength/Tone: WFL for tasks  assessed RLE Sensation:  (denies numbness) Left Lower Extremity Assessment LLE ROM/Strength/Tone: WFL for tasks assessed LLE Sensation:  (denies numbness) Trunk Assessment Trunk Assessment: Normal     Mobility Bed Mobility Bed Mobility: Sit to Sidelying Right Rolling Right: 3: Mod assist;With rail Right Sidelying to Sit: 1: +2 Total assist;HOB flat Right Sidelying to Sit: Patient Percentage: 40% Sitting - Scoot to Edge of Bed: 3: Mod assist Sit to Sidelying Right: 2: Max assist;HOB flat Details for Bed Mobility Assistance: Cues and assist for proper technique.  Pt fatigued Transfers Transfers: Sit to Stand;Stand to Sit Sit to Stand: 4: Min assist;With upper extremity assist;From chair/3-in-1 Sit to Stand: Patient Percentage: 60% Stand to Sit: 4: Min assist;With upper extremity assist;To chair/3-in-1 Stand to Sit: Patient Percentage: 60% Details for Transfer Assistance: vc's for hand placement and for technique     Exercise     Balance Balance Balance Assessed: Yes Static Sitting Balance Static Sitting - Balance Support: Bilateral upper extremity supported;Feet supported Static Sitting - Level of Assistance: 4: Min assist   End of Session OT - End of Session Equipment Utilized During Treatment: Back brace Activity Tolerance: Patient tolerated treatment well Patient left: in bed;with call bell/phone within reach;with family/visitor present Nurse Communication: Mobility status  GO     Joaopedro Eschbach M 07/23/2012, 2:46 PM

## 2012-07-23 NOTE — Evaluation (Signed)
Physical Therapy Evaluation Patient Details Name: Gary [name] MRN: [id number] DOB: 1932-11-27 Today's Date: 07/23/2012 Time: [id number] PT Time Calculation (min): 20 min  PT Assessment / Plan / Recommendation Clinical Impression  Pt. underwent I level XLIF at L2-3 for back and leg pain. Pt. denies leg pain during this session.  He has limitations in functional mobility and gait and needs acute PT to address.   His wife is concerned that she will not be able to care for him upon DC due to her own health issues.  Introduced the possible need for ST SNF for rehab.    PT Assessment  Patient needs continued PT services    Follow Up Recommendations  SNF;Supervision/Assistance - 24 hour;Supervision for mobility/OOB    Does the patient have the potential to tolerate intense rehabilitation      Barriers to Discharge Decreased caregiver support      Equipment Recommendations  None recommended by PT    Recommendations for Other Services     Frequency Min 6X/week    Precautions / Restrictions Precautions Precautions: Back Precaution Booklet Issued: Yes (comment) Precaution Comments: educated pt. and wife on back precautions and log rolling technique Required Braces or Orthoses: Spinal Brace Spinal Brace: Thoracolumbosacral orthotic Restrictions Weight Bearing Restrictions: No Other Position/Activity Restrictions: back precautions   Pertinent Vitals/Pain See vitals tab       Mobility  Bed Mobility Bed Mobility: Rolling Right;Right Sidelying to Sit;Sitting - Scoot to [employer] of Bed Rolling Right: 3: Mod assist;With rail Right Sidelying to Sit: 1: +2 Total assist;HOB flat Right Sidelying to Sit: Patient Percentage: 40% Sitting - Scoot to Edge of Bed: 3: Mod assist Details for Bed Mobility Assistance: cues for technique and precautions Transfers Transfers: Sit to Stand;Stand to Sit Sit to Stand: 1: +2 Total assist;With upper extremity assist;From bed Sit to Stand: Patient  Percentage: 60% Stand to Sit: 1: +2 Total assist;With upper extremity assist;To chair/3-in-1;With armrests Stand to Sit: Patient Percentage: 60% Details for Transfer Assistance: needed cues for technique and physical assist to rise to stand Ambulation/Gait Ambulation/Gait Assistance: 1: +2 Total assist Ambulation/Gait: Patient Percentage: 70% Ambulation Distance (Feet): 2 Feet Assistive device: Rolling walker Ambulation/Gait Assistance Details: Pt. able to take 2 turning steps to 3n1 with 2 assist and cues for technique Gait Pattern: Step-to pattern;Trunk flexed;Decreased step length - right;Decreased step length - left    Exercises     PT Diagnosis: Difficulty walking;Acute pain;Abnormality of gait  PT Problem List: Decreased activity tolerance;Decreased balance;Decreased mobility;Decreased knowledge of use of DME;Decreased knowledge of precautions;Pain PT Treatment Interventions: DME instruction;Gait training;Functional mobility training;Therapeutic activities;Balance training;Patient/family education   PT Goals Acute Rehab PT Goals PT Goal Formulation: With patient Time For Goal Achievement: 07/30/12 Potential to Achieve Goals: Good Pt will go Supine/Side to Sit: with supervision PT Goal: Supine/Side to Sit - Progress: Goal set today Pt will go Sit to Supine/Side: with supervision PT Goal: Sit to Supine/Side - Progress: Goal set today Pt will go Sit to Stand: with supervision PT Goal: Sit to Stand - Progress: Goal set today Pt will go Stand to Sit: with supervision PT Goal: Stand to Sit - Progress: Goal set today Pt will Ambulate: 51 - 150 feet;with supervision;with rolling walker PT Goal: Ambulate - Progress: Goal set today Additional Goals Additional Goal #1: pt. will state and adhere to 3/3 back precautions and log rolling technique PT Goal: Additional Goal #1 - Progress: Goal set today  Visit Information  Last PT Received On: 07/23/12 Assistance Needed: +  2     Subjective Data  Subjective: She just took my catheter out Patient Stated Goal: walk without pain   Prior Functioning  Home Living Lives With: Spouse Available Help at Discharge: Family;Available 24 hours/day (wife reports she cannot manage him right now) Type of Home: House Home Access: Stairs to enter [employer] of Steps: 1 Entrance Stairs-Rails: None Home Layout: One level Bathroom Shower/Tub: Chiropodist: Standard Home Adaptive Equipment: Bedside commode/3-in-1;Walker - rolling;Tub transfer bench;Straight cane Prior Function Level of Independence: Independent with assistive device(s) (used cane as needed PTA) Vocation: Retired Corporate investment banker: No difficulties    Solicitor Arousal/Alertness: Suspect due to medications (somewhat lethargic but responds to all commands and question) Behavior During Therapy: Flat affect Overall Cognitive Status: Within Functional Limits for tasks assessed    Extremity/Trunk Assessment Right Upper Extremity Assessment RUE ROM/Strength/Tone: WFL for tasks assessed Left Upper Extremity Assessment LUE ROM/Strength/Tone: WFL for tasks assessed Right Lower Extremity Assessment RLE ROM/Strength/Tone: WFL for tasks assessed RLE Sensation:  (denies numbness) Left Lower Extremity Assessment LLE ROM/Strength/Tone: WFL for tasks assessed LLE Sensation:  (denies numbness) Trunk Assessment Trunk Assessment: Normal   Balance Balance Balance Assessed: Yes Static Sitting Balance Static Sitting - Balance Support: Bilateral upper extremity supported;Feet supported Static Sitting - Level of Assistance: 4: Min assist  End of Session PT - End of Session Equipment Utilized During Treatment: Gait belt;Back brace Activity Tolerance: Patient tolerated treatment well Patient left: Other (comment) (on 3n1 with wife in room and nurse Beth notified) Nurse Communication: Mobility status;Patient requests pain  meds;Precautions  GP     Ladona Ridgel 07/23/2012, 12:35 PM Gerlean Ren PT Acute Rehab Services [place] [phone]

## 2012-07-23 NOTE — Progress Notes (Signed)
Patient doing well. + expected LBP.  BP 125/59  Pulse 79  Temp(Src) 97.8 F (36.6 C) (Oral)  Resp 16  SpO2 97%  NVI Dressing CDI  POD #1 after L2/3 XLIF doing well  - up with PT/OT today - d/c PCA, start oral meds - SCDs - will likely d/c home Friday vs. Saturday

## 2012-07-23 NOTE — Clinical Social Work Psychosocial (Signed)
Clinical Social Work Department  BRIEF PSYCHOSOCIAL ASSESSMENT  Patient: Gary [name] Account Number: [id number]   Admit date: 07/22/12 Clinical Social Worker Rhea Pink, MSW Date/Time: 07/23/2012 3:10 PM Referred by: Physician Date Referred:  Referred for   SNF Placement   Other Referral:  Interview type: Patient was asleep. SW spoke with patient's wife Other interview type: PSYCHOSOCIAL DATA  Living Status: Wife Admitted from facility:  Level of care:  Primary support name: Gary [name] Primary support relationship to patient: Wife Degree of support available:  Strong and vested  CURRENT CONCERNS  Current Concerns   Post-Acute Placement   Other Concerns:  SOCIAL WORK ASSESSMENT / PLAN  CSW met with pt but patient was sleeping. SW spoke with patient's wife re: PT recommendation for SNF.   Pt lives with his wife  CSW explained placement process and answered questions.   Pt's family would like the patient to return to home with [facility]. Patient's family were agreeable to being faxed out for SNF if patient needs extra assistance  CSW completed FL2 and initiated SNF search.     Assessment/plan status: Information/Referral to [employer]  Other assessment/ plan:  Information/referral to community resources:  SNF   PTAR  PATIENT'S/FAMILY'S RESPONSE TO PLAN OF CARE:  Pt's family reports that they would like to take the patient home with home health. SW did fax out patient's clinicals in case family changes their mind. Pt's family  verbalized  appreciation for CSW assist. SW will continue to follow  Rhea Pink, MSW [phone]

## 2012-07-24 ENCOUNTER — Inpatient Hospital Stay (HOSPITAL_COMMUNITY): Payer: Medicare Other

## 2012-07-24 DIAGNOSIS — R404 Transient alteration of awareness: Secondary | ICD-10-CM

## 2012-07-24 DIAGNOSIS — K929 Disease of digestive system, unspecified: Secondary | ICD-10-CM

## 2012-07-24 DIAGNOSIS — R195 Other fecal abnormalities: Secondary | ICD-10-CM

## 2012-07-24 DIAGNOSIS — F411 Generalized anxiety disorder: Secondary | ICD-10-CM

## 2012-07-24 DIAGNOSIS — K560 Paralytic ileus: Secondary | ICD-10-CM

## 2012-07-24 DIAGNOSIS — R400 Somnolence: Secondary | ICD-10-CM | POA: Diagnosis not present

## 2012-07-24 DIAGNOSIS — K567 Ileus, unspecified: Secondary | ICD-10-CM | POA: Diagnosis not present

## 2012-07-24 LAB — LACTIC ACID, PLASMA: Lactic Acid, Venous: 1.8 mmol/L (ref 0.5–2.2)

## 2012-07-24 LAB — CBC
MCV: 89.5 fL (ref 78.0–100.0)
Platelets: 152 10*3/uL (ref 150–400)
RBC: 4.78 MIL/uL (ref 4.22–5.81)
WBC: 11.7 10*3/uL — ABNORMAL HIGH (ref 4.0–10.5)

## 2012-07-24 LAB — COMPREHENSIVE METABOLIC PANEL
ALT: 9 U/L (ref 0–53)
AST: 25 U/L (ref 0–37)
Alkaline Phosphatase: 34 U/L — ABNORMAL LOW (ref 39–117)
CO2: 26 mEq/L (ref 19–32)
Chloride: 100 mEq/L (ref 96–112)
Creatinine, Ser: 1.44 mg/dL — ABNORMAL HIGH (ref 0.50–1.35)
GFR calc non Af Amer: 45 mL/min — ABNORMAL LOW (ref >90)
Total Bilirubin: 0.7 mg/dL (ref 0.3–1.2)

## 2012-07-24 LAB — GLUCOSE, CAPILLARY: Glucose-Capillary: 134 mg/dL — ABNORMAL HIGH (ref 70–99)

## 2012-07-24 MED ORDER — ALBUTEROL SULFATE (5 MG/ML) 0.5% IN NEBU: 2.5000 mg | INHALATION_SOLUTION | RESPIRATORY_TRACT | Status: DC | PRN

## 2012-07-24 MED ORDER — PROMETHAZINE HCL 25 MG/ML IJ SOLN
12.5000 mg | Freq: Four times a day (QID) | INTRAMUSCULAR | Status: DC | PRN
  Administered 2012-07-24: 12.5 mg via INTRAVENOUS
  Filled 2012-07-24: qty 1

## 2012-07-24 MED ORDER — VANCOMYCIN HCL IN DEXTROSE 750-5 MG/150ML-% IV SOLN
750.0000 mg | Freq: Two times a day (BID) | INTRAVENOUS | Status: DC
  Administered 2012-07-25 (×2): 750 mg via INTRAVENOUS
  Filled 2012-07-24 (×3): qty 150

## 2012-07-24 MED ORDER — LORAZEPAM 0.5 MG PO TABS
0.5000 mg | ORAL_TABLET | Freq: Three times a day (TID) | ORAL | Status: DC | PRN
  Administered 2012-07-27: 0.5 mg via ORAL
  Filled 2012-07-24: qty 1

## 2012-07-24 MED ORDER — HYDROCODONE-ACETAMINOPHEN 7.5-325 MG PO TABS: 1.0000 | ORAL_TABLET | ORAL | Status: DC | PRN

## 2012-07-24 MED ORDER — PROMETHAZINE HCL 25 MG RE SUPP: 12.5000 mg | Freq: Four times a day (QID) | RECTAL | Status: DC | PRN

## 2012-07-24 MED ORDER — DEXTROSE 5 % IV SOLN: 1.0000 g | Freq: Three times a day (TID) | INTRAVENOUS | Status: DC

## 2012-07-24 MED ORDER — CEFEPIME HCL 2 G IJ SOLR
2.0000 g | Freq: Every day | INTRAMUSCULAR | Status: DC
  Administered 2012-07-25: 2 g via INTRAVENOUS
  Filled 2012-07-24 (×2): qty 2

## 2012-07-24 MED ORDER — PROMETHAZINE HCL 12.5 MG PO TABS
12.5000 mg | ORAL_TABLET | Freq: Four times a day (QID) | ORAL | Status: DC | PRN
  Filled 2012-07-24: qty 1

## 2012-07-24 MED FILL — Heparin Sodium (Porcine) Inj 1000 Unit/ML: INTRAMUSCULAR | Qty: 30 | Status: AC

## 2012-07-24 MED FILL — Sodium Chloride IV Soln 0.9%: INTRAVENOUS | Qty: 1000 | Status: AC

## 2012-07-24 MED FILL — Sodium Chloride Irrigation Soln 0.9%: Qty: 1000 | Status: AC

## 2012-07-24 NOTE — Telephone Encounter (Signed)
Pharmacist calling to check on refill-been sent over twice-needs refill on Citalopram 40mg #30-[phone]-Please call today!

## 2012-07-24 NOTE — Consult Note (Signed)
[facility] CONSULT NOTE  Gary [name] [id number] DOB: July 31, 1932 DOA: 07/22/2012 PCP: [name], MD  Impression/recommendation    Ileus, postoperative: NPO, hold most oral meds. Increase IVF. Also need to r/o c diff or uti as cause of vomiting. Ordered both. Results pending. Lactate ok. If vomiting continues, will need NGT.  Dehydration    HYPERTENSION    CAD, CFX MI Rx'd medically 6/11    DIVERTICULOSIS, COLON, WITH HEMORRHAGE, surg 12/11     Loose stools, chronic, but r/o c diff    Dyslipidemia    BPH (benign prostatic hyperplasia)    CKD (chronic kidney disease), stage III    Somnolence after phenergan. Will d/c phenergan and change ativan to as needed. D/c ambien    Anxiety state, unspecified  Thanks for the consult.  We will follow  HPI/Subjective: 79 y/o male pod 2 lumbar fusion. Had been doing well postop until after supper last night. Multiple bouts of emesis.  Has chronic loose stool after cholecystectomy and partial collectomy for recurrent diverticular bleed.  Got zofran last night. After phenergan this morning, has been somnolent all day. No reports of hematemesis or melena. C/o upper abd pain. Had some brief dysuria after foley removed.  [facility] asked to consult for n/v.  Objective: Filed Vitals:   07/23/12 1500 07/23/12 2203 07/23/12 2204 07/24/12 0620  BP: 125/57 140/52 140/52 145/59  Pulse: 69  84 83  Temp: 98.7 F (37.1 C)  99.8 F (37.7 C) 98.9 F (37.2 C)  TempSrc:      Resp: 18  18 18   SpO2: 96%  93% 95%    Intake/Output Summary (Last 24 hours) at 07/24/12 1722 Last data filed at 07/24/12 0540  Gross per 24 hour  Intake    123 ml  Output    256 ml  Net   -133 ml   There were no vitals filed for this visit.  Exam:  BP 158/66  Pulse 83  Temp(Src) 99.9 F (37.7 C) (Oral)  Resp 18  SpO2 99%  General Appearance:   somnolent. Arousable, but falls quickly back asleep  Head:    Normocephalic, without obvious  abnormality, atraumatic  Eyes:    PERRL, conjunctiva/corneas clear, EOM's intact, fundi    benign, both eyes          Nose:   Nares normal, septum midline, mucosa normal, no drainage   or sinus tenderness  Throat:   Dry mucous membranes  Neck:   Supple, symmetrical, trachea midline, no adenopathy;       thyroid:  No enlargement/tenderness/nodules; no carotid   bruit or JVD     Lungs:     Clear to auscultation bilaterally, respirations unlabored  Chest wall:    No tenderness or deformity  Heart:    Regular rate and rhythm, S1 and S2 normal, no murmur, rub   or gallop  Abdomen:     High pitched bowel sounds. Distended. Nontender. tympanetic  Genitalia:  deferred  Rectal:   deferred  Extremities:   Extremities normal, atraumatic, no cyanosis or edema  Pulses:   2+ and symmetric all extremities  Skin:   Skin color, texture, turgor normal, no rashes or lesions  Lymph nodes:   Cervical, supraclavicular, and axillary nodes normal  Neurologic:   CNII-XII intact. Normal strength, sensation and reflexes      throughout    Data Reviewed: BMET    Component Value Date/Time   NA 137 07/24/2012 1731   K 3.7  07/24/2012 1731   CL 100 07/24/2012 1731   CO2 26 07/24/2012 1731   GLUCOSE 145* 07/24/2012 1731   BUN 28* 07/24/2012 1731   CREATININE 1.44* 07/24/2012 1731   CREATININE 1.63* 03/04/2011 1537   CALCIUM 8.9 07/24/2012 1731   GFRNONAA 45* 07/24/2012 1731   GFRAA 52* 07/24/2012 1731   CBC    Component Value Date/Time   WBC 11.7* 07/24/2012 1717   RBC 4.78 07/24/2012 1717   HGB 14.3 07/24/2012 1717   HCT 42.8 07/24/2012 1717   PLT 152 07/24/2012 1717   MCV 89.5 07/24/2012 1717   MCH 29.9 07/24/2012 1717   MCHC 33.4 07/24/2012 1717   RDW 13.4 07/24/2012 1717   LYMPHSABS 1.2 07/15/2012 1443   MONOABS 0.6 07/15/2012 1443   EOSABS 0.2 07/15/2012 1443   BASOSABS 0.0 07/15/2012 1443   Lactate 1.8  Recent Labs Lab 07/24/12 1519  GLUCAP 129*    Recent Results (from the past 240 hour(s))  SURGICAL  PCR SCREEN     Status: Abnormal   Collection Time    07/15/12  2:41 PM      Result Value Range Status   MRSA, PCR NEGATIVE  NEGATIVE Final   Staphylococcus aureus POSITIVE (*) NEGATIVE Final   Comment:            The Xpert SA Assay (FDA     approved for NASAL specimens     in patients over 62 years of age),     is one component of     a comprehensive surveillance     program.  Test performance has     been validated by [employer] for patients greater     than or equal to 39 year old.     It is not intended     to diagnose infection nor to     guide or monitor treatment.    Studies: Xray shows ileus  Scheduled Meds: . citalopram  40 mg Oral Daily  . doxazosin  8 mg Oral QHS  . fluticasone  2 spray Each Nare Daily  . sodium chloride  3 mL Intravenous Q12H  . tamsulosin  0.4 mg Oral QPC supper  . testosterone  5 g Transdermal Daily   Continuous Infusions: . sodium chloride    . sodium chloride 75 mL/hr at 07/23/12 2257   Time spent: 60 minutes  [facility] Pager [phone]. If 7PM-7AM, please contact night-coverage at [email or web address], password [facility] 07/24/2012, 5:22 PM  LOS: 2 days

## 2012-07-24 NOTE — Progress Notes (Signed)
Occupational Therapy Treatment Patient Details Name: Gary [name] MRN: [id number] DOB: [birth year] Today's Date: 07/24/2012 Time: [id number] OT Time Calculation (min): 33 min  OT Assessment / Plan / Recommendation Comments on Treatment Session  Pt difficult to arouse during session. Pt unsafe to d/c home at this time. Pt having difficulty following commands and requires increased time to follow through. Pt incontinent of bowel upon standing from bed. Wife practiced donning brace.     Follow Up Recommendations  SNF;Supervision/Assistance - 24 hour    Barriers to Discharge       Equipment Recommendations  3 in 1 bedside comode    Recommendations for Other Services    Frequency Min 2X/week   Plan Discharge plan needs to be updated    Precautions / Restrictions Precautions Precautions: Back Precaution Booklet Issued: No Precaution Comments: pt able to state 3/3 back precautions Required Braces or Orthoses: Spinal Brace Spinal Brace: Thoracolumbosacral orthotic Restrictions Weight Bearing Restrictions: No Other Position/Activity Restrictions: back precautions   Pertinent Vitals/Pain Pt reports pain, but did not rate. Repositioned.     ADL  Eating/Feeding: Supervision/safety (pt very lethargic/ eating cracker when OT arrived) Where Assessed - Eating/Feeding: Bed level Grooming: Performed;Wash/dry face;Set up;Supervision/safety Where Assessed - Grooming: Unsupported sitting Toilet Transfer: Minimal assistance Toilet Transfer Method: Sit to stand Toilet Transfer Equipment: Other (comment) (from bed to recliner chair) Toileting - Clothing Manipulation and Hygiene: +1 Total assistance Where Assessed - Toileting Clothing Manipulation and Hygiene: Standing Equipment Used: Back brace;Rolling walker;Sock aid;Reacher;Long-handled shoe horn;Long-handled sponge Transfers/Ambulation Related to ADLs: Mod A for ambulation. Min A for transfers.  ADL Comments: Had wife practice donning back  brace and wife requiring Mod A. Pt very lethargic during session. Pt transferred from bed and walked to chair. Pt incontinent of stool. Pt unable to fully reach behind to perform hygiene requiring +1 total A. Introduced AE to wife.     OT Diagnosis:    OT Problem List:   OT Treatment Interventions:     OT Goals Acute Rehab OT Goals OT Goal Formulation: With patient/family Time For Goal Achievement: 07/30/12 Potential to Achieve Goals: Good ADL Goals Pt Will Perform Grooming: with supervision;Standing at sink ADL Goal: Grooming - Progress: Progressing toward goals Pt Will Perform Upper Body Bathing: with supervision;Sitting, chair;Sitting, edge of bed Pt Will Perform Lower Body Bathing: with supervision;Sit to stand from chair;Sit to stand from bed Pt Will Perform Lower Body Dressing: with supervision;Sit to stand from chair;Sit to stand from bed;with adaptive equipment Pt Will Transfer to Toilet: with supervision;Ambulation;with DME ADL Goal: Toilet Transfer - Progress: Progressing toward goals Pt Will Perform Toileting - Clothing Manipulation: with supervision;Sitting on 3-in-1 or toilet;with adaptive equipment;Standing Pt Will Perform Toileting - Hygiene: with supervision;Sit to stand from 3-in-1/toilet;with adaptive equipment ADL Goal: Toileting - Hygiene - Progress: Progressing toward goals Pt Will Perform Tub/Shower Transfer: with min assist;Ambulation;with DME Additional ADL Goal #1: Wife will be independent with assisting pt with brace and ADLs as needed ADL Goal: Additional Goal #1 - Progress: Progressing toward goals  Visit Information  Last OT Received On: 07/24/12 Assistance Needed: +2 PT/OT Co-Evaluation/Treatment: Yes    Subjective Data      Prior Functioning       Cognition  Cognition Arousal/Alertness: Lethargic Behavior During Therapy: Flat affect Overall Cognitive Status: Difficult to assess Difficult to assess due to: Level of arousal (pt responding slowly  to commands)    Mobility  Bed Mobility Bed Mobility: Rolling Right;Right Sidelying to Sit;Sitting - Scoot to  Edge of Bed Rolling Right: 3: Mod assist Right Sidelying to Sit: 3: Mod assist;HOB flat Sitting - Scoot to Edge of Bed: 4: Min assist Details for Bed Mobility Assistance: Mod A for bed mobility. Pt responding slowly to commands.  Cues and assitance for log rolling technique. Transfers Transfers: Sit to Stand;Stand to Sit Sit to Stand: 4: Min assist;With upper extremity assist;From bed Stand to Sit: 4: Min assist;With upper extremity assist;To chair/3-in-1 Details for Transfer Assistance: Cues for hand placement and to maintain back precautions.    Exercises      Balance     End of Session OT - End of Session Equipment Utilized During Treatment: Back brace Activity Tolerance: Patient limited by fatigue Patient left: in chair;with call bell/phone within reach;with family/visitor present  GO     Benito Mccreedy OTR/L [id number] 07/24/2012, 3:28 PM

## 2012-07-24 NOTE — Progress Notes (Addendum)
Pt has large amount of green emesis. Notified Kayla PA for Dr. Lynann Bologna. Pt has not eaten today due to being Lethargic after receiving phenergan this am. Vitals remain stable and pt is on a continuous pulse Ox. EKG showed NSR. CBG 129 at this time. Pt given ginger ale and chicken broth to encourage PO intake. Will continue to monitor pt.

## 2012-07-24 NOTE — Progress Notes (Signed)
Patient c/o substantial nausea through night, several episodes of emesis, has had multiple BM since surgery w/o difficulty. Doing well from standpoint of surgery, expected LBP well controlled on medicine. No other pain complaints   BP 145/59  Pulse 83  Temp(Src) 98.9 F (37.2 C) (Oral)  Resp 18  SpO2 95%  NVI  Dressing CDI   POD #2 after L2/3 XLIF doing well  - Cont PT/OT today  -Will require HH upon d/c - Cont oral pain meds, trial switching percocet to Hydrocodone for improvement of nausea - Nausea, add phenergan  - SCDs  - d/c home today vs. Saturday pending nausea control and PT/OT clearance

## 2012-07-24 NOTE — Progress Notes (Addendum)
ANTIBIOTIC CONSULT NOTE - INITIAL  Pharmacy Consult for Vancomycin and Cefepime Indication: suspected PNA  Allergies  Allergen Reactions  . Naproxen Rash  . Clopidogrel Bisulfate Other (See Comments)    H/O diverticulitis; prone to rectal bleeding.  Plavix complicated this.  jkl  . Sulfonamide Derivatives Hives  . Tape Itching and Rash    Adhesive tape    Patient Measurements: Height: 5\' 8"  (172.7 cm) Weight: 205 lb (92.987 kg) IBW/kg (Calculated) : 68.4  Vital Signs: Temp: 97.5 F (36.4 C) (06/13 2255) BP: 150/64 mmHg (06/13 2255) Pulse Rate: 82 (06/13 2255) Intake/Output from previous day: 06/12 0701 - 06/13 0700 In: 603 [P.O.:600; I.V.:3] Out: 406 [Urine:400; Emesis/NG output:5; Stool:1] Intake/Output from this shift:    Labs:  Recent Labs  07/24/12 1717 07/24/12 1731  WBC 11.7*  --   HGB 14.3  --   PLT 152  --   CREATININE  --  1.44*   Estimated Creatinine Clearance: 46.8 ml/min (by C-G formula based on Cr of 1.44). No results found for this basename: VANCOTROUGH, Corlis Leak, VANCORANDOM, GENTTROUGH, GENTPEAK, GENTRANDOM, TOBRATROUGH, TOBRAPEAK, TOBRARND, AMIKACINPEAK, AMIKACINTROU, AMIKACIN,  in the last 72 hours   Microbiology: Recent Results (from the past 720 hour(s))  SURGICAL PCR SCREEN     Status: Abnormal   Collection Time    07/15/12  2:41 PM      Result Value Range Status   MRSA, PCR NEGATIVE  NEGATIVE Final   Staphylococcus aureus POSITIVE (*) NEGATIVE Final   Comment:            The Xpert SA Assay (FDA     approved for NASAL specimens     in patients over 76 years of age),     is one component of     a comprehensive surveillance     program.  Test performance has     been validated by [employer] for patients greater     than or equal to 51 year old.     It is not intended     to diagnose infection nor to     guide or monitor treatment.    Medical History: Past Medical History  Diagnosis Date  . Hypertension   . Hemorrhoids    . Chronic kidney disease   . Kidney stones   . Dyslipidemia   . Diverticulosis   . Colon polyps   . Blood transfusion   . Anemia   . GI bleeding after 07/2009    "triggered by Plavix & ASA; from my diverticulosis"  . GERD (gastroesophageal reflux disease)   . Headache(784.0)     occasionally  . Anxiety   . Arthritis     "hips and lower back"  . Chronic back pain greater than 3 months duration   . Diarrhea   . Arthritis pain   . Cholecystitis   . Heart attack 07/2009  . Coronary artery disease   . History of shingles   . Heart murmur   . Neuromuscular disorder     Medications:  Prescriptions prior to admission  Medication Sig Dispense Refill  . aspirin EC 81 MG tablet Take 81 mg by mouth daily.      . citalopram (CELEXA) 40 MG tablet Take 40 mg by mouth daily.      Marland Kitchen doxazosin (CARDURA) 8 MG tablet Take 8 mg by mouth at bedtime.      . fexofenadine (ALLEGRA) 180 MG tablet Take 180 mg by mouth daily.       Marland Kitchen  fluticasone (FLONASE) 50 MCG/ACT nasal spray 2 sprays by Nasal route daily.        . furosemide (LASIX) 20 MG tablet Take 20 mg by mouth daily.      Marland Kitchen gabapentin (NEURONTIN) 400 MG capsule Take 400 mg by mouth 3 (three) times daily.       Marland Kitchen LORazepam (ATIVAN) 0.5 MG tablet Take 0.5 mg by mouth every 8 (eight) hours.      . nitroGLYCERIN (NITROSTAT) 0.4 MG SL tablet Place 0.4 mg under the tongue every 5 (five) minutes as needed for chest pain. For chest pain.      . simvastatin (ZOCOR) 40 MG tablet Take 40 mg by mouth at bedtime.       . Tamsulosin HCl (FLOMAX) 0.4 MG CAPS Take 0.4 mg by mouth daily after supper.      . testosterone (ANDROGEL) 50 MG/5GM GEL Place 5 g onto the skin daily.       . [DISCONTINUED] HYDROcodone-acetaminophen (NORCO/VICODIN) 5-325 MG per tablet Take 1 tablet by mouth every 6 (six) hours as needed for pain.      . [DISCONTINUED] traMADol-acetaminophen (ULTRACET) 37.5-325 MG per tablet Take 2 tablets by mouth every 8 (eight) hours as needed for  pain. May take every 6 hours is needed for pain.       Assessment: 79 y.o. male admitted 6/11 for L2-3 fusion (done 6/12). Today pt experienced AMS following incidence of vomiting - AMS now resolved. Noted pt may have aspirated during last vomiting episode. Now to begin broad spectrum antibiotics (Vancomycin and Cefepime) for suspected PNA.  Goal of Therapy:  Vancomycin trough level 15-20 mcg/ml  Plan:  1. Change Cefepime to 2gm IV q24h. 2. Vancomycin 750mg  IV q12h. 3. Will f/u microbiological data, renal function, and trough when appropriate  Sherlon Handing, PharmD, BCPS Clinical pharmacist, pager [phone] 07/24/2012,11:15 PM

## 2012-07-24 NOTE — Progress Notes (Signed)
Physical Therapy Treatment Patient Details Name: Gary [name] MRN: [id number] DOB: June 01, 1932 Today's Date: 07/24/2012 Time: [id number] PT Time Calculation (min): 31 min  PT Assessment / Plan / Recommendation Comments on Treatment Session  Patient continues to be difficult to fully arouse. At this time patient is unsafe to DC home. Patient having difficulty following commands and requires increased time to follow through. Upon standing patient incontinent of bowel. Continue current POC at this time    Follow Up Recommendations  SNF;Supervision/Assistance - 24 hour;Supervision for mobility/OOB     Does the patient have the potential to tolerate intense rehabilitation     Barriers to Discharge        Equipment Recommendations  None recommended by PT    Recommendations for Other Services    Frequency Min 6X/week   Plan Discharge plan remains appropriate;Frequency remains appropriate    Precautions / Restrictions Precautions Precautions: Back Precaution Booklet Issued: No Precaution Comments: pt able to state 3/3 back precautions Required Braces or Orthoses: Spinal Brace Spinal Brace: Thoracolumbosacral orthotic Restrictions Weight Bearing Restrictions: No Other Position/Activity Restrictions: back precautions   Pertinent Vitals/Pain     Mobility  Bed Mobility Bed Mobility: Rolling Right;Right Sidelying to Sit Rolling Right: 3: Mod assist Right Sidelying to Sit: 3: Mod assist;HOB flat Sitting - Scoot to Edge of Bed: 4: Min assist Details for Bed Mobility Assistance: Mod A for bed mobility. Pt responding slowly to commands. Cued for log roll technique Transfers Sit to Stand: 4: Min assist;With upper extremity assist;From bed Stand to Sit: 4: Min assist;With upper extremity assist;To chair/3-in-1 Details for Transfer Assistance: Cues for hand placement and to maintain back precautions. A for safety and balance Ambulation/Gait Ambulation/Gait Assistance: 3: Mod  assist Ambulation Distance (Feet): 10 Feet Assistive device: Rolling walker Ambulation/Gait Assistance Details: Max cues for RW postioning and posture Gait Pattern: Step-to pattern;Trunk flexed;Decreased step length - right;Decreased step length - left Gait velocity: decreased    Exercises     PT Diagnosis:    PT Problem List:   PT Treatment Interventions:     PT Goals Acute Rehab PT Goals PT Goal: Supine/Side to Sit - Progress: Progressing toward goal PT Goal: Sit to Stand - Progress: Progressing toward goal PT Goal: Stand to Sit - Progress: Progressing toward goal PT Goal: Ambulate - Progress: Progressing toward goal Additional Goals PT Goal: Additional Goal #1 - Progress: Progressing toward goal  Visit Information  Last PT Received On: 07/24/12 Assistance Needed: +2    Subjective Data      Cognition  Cognition Arousal/Alertness: Awake/alert Behavior During Therapy: Flat affect Overall Cognitive Status: Difficult to assess Difficult to assess due to: Level of arousal    Balance     End of Session PT - End of Session Equipment Utilized During Treatment: Gait belt;Back brace Activity Tolerance: Patient tolerated treatment well Nurse Communication: Mobility status;Precautions   GP     Jacqualyn Posey 07/24/2012, 1:53 PM 07/24/2012 Jacqualyn Posey PTA [phone] pager [phone] office

## 2012-07-24 NOTE — Progress Notes (Signed)
[facility] hospital[facility] progress note. Chief complaint. Altered mental status. History of present illness. This 79 year old male in hospital for L2-3 fusion. Patient has suffered with what sounds like postoperative nausea and vomiting. Patient had an incident of nausea and vomiting while laying flat in bed this evening. After the incident nursing noted that the patient seemed confused compared to his normal baseline. I came up to see the patient at bedside and I believe rapid response was also called earlier. Nursing informs me that just after vomiting the patient was oriented to self but not to location. No reports of rapid response found no neurologic deficits. I found the patient alert in no distress. He currently appears oriented x4 and his distant memory recall is intact. He is unable to perform serial sevens. Vital signs temperature 99.9, pulse 83, respiration 18, blood pressure 158/86. O2 sats 99%. General appearance. Well-developed elderly male who is alert, cooperative, and in no distress. Cardiac. Rate and rhythm regular. Lungs. Diminished sounds right base. Some rhonchi heard in the left base. No distress and O2 sats are stable on low-flow nasal cannula oxygen. Abdomen. Soft with positive bowel sounds. No pain. Neurologic. Cranial nerves 2-12 grossly intact. No unilateral or focal defects identified. Speech is clear and the patient currently appears oriented x4. Impression/plan. Problem #1 transient altered mental status. Etiology of this is not entirely clear. Possibly the patient became hypoxic briefly following an incidence of vomiting. Patient's mental status now appears at baseline and there is no neurologic deficits identified per physical exam. Nursing will continue to monitor and update me of any further changes. I think we can defer a CT scan at this point. Problem #2 vomiting with possible aspiration. Nursing indicates that the patient may have had difficulty clearing his airway during  this latest vomiting episode. Rhonchi noted in the left base and decreased sounds in the right base. I will obtain a stat portable chest x-ray to rule out any developing pneumonia with nursing requested to call me results. We'll follow for this and add antibiotics if this appears appropriate.

## 2012-07-24 NOTE — Progress Notes (Signed)
Been in discussion with floor nurse, pt experiencing recurrence of nausea after eating chicken broth and ginger ale, has had second episode of emesis, first was green/bile, second continues to be bile with brown discoloration now as well. Pt continues to be lethargic. Pt has had several loose bowel movements today but is S/P partial colon resection and cholecystectomy and states soft/loose stools is his baseline but increased frequency today. Concern for possible partial bowel obstruction, [facility] hospitalist was consulted. Spoke with Dr Conley Canal and she will be ordering labs and seeing the pt today.

## 2012-07-25 DIAGNOSIS — N400 Enlarged prostate without lower urinary tract symptoms: Secondary | ICD-10-CM

## 2012-07-25 LAB — URINALYSIS, ROUTINE W REFLEX MICROSCOPIC
Bilirubin Urine: NEGATIVE
Glucose, UA: 100 mg/dL — AB
Ketones, ur: 15 mg/dL — AB
Protein, ur: 100 mg/dL — AB

## 2012-07-25 LAB — LEGIONELLA ANTIGEN, URINE

## 2012-07-25 LAB — URINE MICROSCOPIC-ADD ON

## 2012-07-25 LAB — STREP PNEUMONIAE URINARY ANTIGEN: Strep Pneumo Urinary Antigen: NEGATIVE

## 2012-07-25 MED ORDER — VANCOMYCIN 50 MG/ML ORAL SOLUTION
125.0000 mg | Freq: Four times a day (QID) | ORAL | Status: DC
  Administered 2012-07-25 – 2012-07-27 (×9): 125 mg via ORAL
  Filled 2012-07-25 (×12): qty 2.5

## 2012-07-25 MED ORDER — MORPHINE SULFATE 2 MG/ML IJ SOLN: 1.0000 mg | INTRAMUSCULAR | Status: DC | PRN

## 2012-07-25 MED ORDER — METRONIDAZOLE IN NACL 5-0.79 MG/ML-% IV SOLN
500.0000 mg | Freq: Three times a day (TID) | INTRAVENOUS | Status: DC
  Administered 2012-07-25 – 2012-07-26 (×4): 500 mg via INTRAVENOUS
  Filled 2012-07-25 (×5): qty 100

## 2012-07-25 NOTE — Progress Notes (Addendum)
Events of last night noted.  [facility]   [name] [id number] DOB: 1932/04/23 DOA: 07/22/2012  PCP: [name], MD   Impression/recommendation   Ileus, postoperative: Abdomen much less distended today, though no bowel sounds. Still having abdominal pain. No vomiting since last night at shift change. Would continue ice chips today, then consider starting clears tomorrow if no vomiting and if patient continues to improve. C. difficile pending. UA shows no evidence of infection. If vomiting continues, will need NGT. Try to limit opiates.  will give empiric IV Flagyl pending C. difficile results.  Abnormal chest x-ray: Patient has no cough. Could be atelectasis versus pneumonitis. Will stop vancomycin and cefepime especially since C diff pcr is still pending. Needs pulmonary toilet.  Dehydration   HYPERTENSION   CAD, CFX MI Rx'd medically 6/11   DIVERTICULOSIS, COLON, WITH HEMORRHAGE, surg 12/11   Loose stools, chronic, but r/o c diff   Dyslipidemia   BPH (benign prostatic hyperplasia): Foley placed last night, as patient was not voiding despite over 600 cc in the bladder. We'll continue for now.  CKD (chronic kidney disease), stage III   Somnolence. Slightly more interactive today, but remains quite sleepy  Anxiety state, unspecified   Updated wife at bedside.  HPI/Subjective:  Complains of epigastric discomfort. No nausea currently. No shortness of breath. No cough.  Objective:  Filed Vitals:    07/23/12 1500  07/23/12 2203  07/23/12 2204  07/24/12 0620   BP:  125/57  140/52  140/52  145/59   Pulse:  69   84  83   Temp:  98.7 F (37.1 C)   99.8 F (37.7 C)  98.9 F (37.2 C)   TempSrc:       Resp:  18   18  18    SpO2:  96%   93%  95%     Intake/Output Summary (Last 24 hours) at 07/24/12 1722 Last data filed at 07/24/12 0540   Gross per 24 hour   Intake  123 ml   Output  256 ml   Net  -133 ml    Filed Vitals:   07/24/12 2255 07/24/12 2300  07/25/12 0718 07/25/12 0925  BP: 150/64  145/65 149/68  Pulse: 82  84 81  Temp: 97.5 F (36.4 C)  97.1 F (36.2 C) 99.3 F (37.4 C)  TempSrc:    Oral  Resp: 18  18 16   Height:  5\' 8"  (1.727 m)    Weight:  92.987 kg (205 lb)    SpO2: 99%  99% 98%    Exam:   General: Asleep. Arousable. Appropriate. Comfortable. Breathing nonlabored. HEENT: Sclera nonicteric, some scleral edema. Mucous membranes remain dry. Lungs clear to auscultation bilaterally without wheeze rhonchi or rales Cardiovascular regular rate rhythm without murmurs gas rubs Abdomen positive bowel sounds. Much less distended. No tenderness. Extremities sequential compression devices in place.   Studies:  Xray shows ileus   Chest x-ray shows atelectasis versus infiltrate  Scheduled Meds:  .  citalopram  40 mg  Oral  Daily   .  doxazosin  8 mg  Oral  QHS   .  fluticasone  2 spray  Each Nare  Daily   .  sodium chloride  3 mL  Intravenous  Q12H   .  tamsulosin  0.4 mg  Oral  QPC supper   .  testosterone  5 g  Transdermal  Daily    Continuous Infusions:  .  sodium chloride    .  sodium chloride  75 mL/hr at 07/23/12 2257    Time spent: 35 minutes  [facility]  Pager [phone]. If 7PM-7AM, please contact night-coverage at [email or web address], password [facility]  07/24/2012, 5:22 PM LOS: 2 days

## 2012-07-25 NOTE — Progress Notes (Signed)
Physical Therapy Treatment Patient Details Name: Gary [name] MRN: [id number] DOB: 1932-12-26 Today's Date: 07/25/2012 Time: [id number] PT Time Calculation (min): 14 min  PT Assessment / Plan / Recommendation Comments on Treatment Session  Patient more alert today.  Improved mobility and gait.    Follow Up Recommendations  SNF     Does the patient have the potential to tolerate intense rehabilitation     Barriers to Discharge        Equipment Recommendations  None recommended by PT    Recommendations for Other Services    Frequency Min 6X/week   Plan Discharge plan remains appropriate;Frequency remains appropriate    Precautions / Restrictions Precautions Precautions: Back Precaution Comments: pt able to state 3/3 back precautions Required Braces or Orthoses: Spinal Brace Spinal Brace: Thoracolumbosacral orthotic Restrictions Weight Bearing Restrictions: No   Pertinent Vitals/Pain     Mobility  Bed Mobility Bed Mobility: Not assessed Transfers Transfers: Sit to Stand;Stand to Sit Sit to Stand: 4: Min assist;With upper extremity assist;With armrests;From chair/3-in-1 Stand to Sit: 4: Min assist;With upper extremity assist;With armrests;To chair/3-in-1 Details for Transfer Assistance: Cues for hand placement and to maintain back precautions. Ambulation/Gait Ambulation/Gait Assistance: 4: Min assist (+1 for safety) Ambulation Distance (Feet): 52 Feet Assistive device: Rolling walker Ambulation/Gait Assistance Details: Verbal cues to remain upright and look forward during gait.  Physical assist with RW during turns.   Gait Pattern: Step-through pattern;Decreased step length - right;Decreased step length - left;Decreased stride length;Trunk flexed Gait velocity: decreased      PT Goals Acute Rehab PT Goals Pt will go Sit to Stand: with supervision PT Goal: Sit to Stand - Progress: Progressing toward goal Pt will go Stand to Sit: with supervision PT Goal: Stand to  Sit - Progress: Progressing toward goal Pt will Ambulate: 51 - 150 feet;with supervision;with rolling walker PT Goal: Ambulate - Progress: Progressing toward goal Additional Goals Additional Goal #1: pt. will state and adhere to 3/3 back precautions and log rolling technique PT Goal: Additional Goal #1 - Progress: Progressing toward goal  Visit Information  Last PT Received On: 07/25/12 Assistance Needed: +2    Subjective Data  Subjective: "I got up with the nurse"   Cognition  Cognition Arousal/Alertness: Awake/alert Behavior During Therapy: Flat affect Overall Cognitive Status: Within Functional Limits for tasks assessed (More alert today)    Balance     End of Session PT - End of Session Equipment Utilized During Treatment: Gait belt;Back brace Activity Tolerance: Patient limited by fatigue Patient left: in chair;with call bell/phone within reach Nurse Communication: Mobility status   GP     Despina Pole 07/25/2012, 2:44 PM Carita Pian. Sanjuana Kava, [place] Pager [phone]

## 2012-07-25 NOTE — Progress Notes (Addendum)
During shift change, family of patient told us that the patient had vomited again. As the nurse tech and myself was cleaning him up, the  patient seemed confused. Could not tell me where he was, the correct year or the correct president. Patient acknowledge his wife at this time. Rapid response was called and found no neuro deficits. Vital signs were temperature 99.9, BP 158/66, pulse 83, respirations 18, 99% on 2 L O2. Contacted on call MD, Harless Litten, NP came and assessed patient. Patient was alert and oriented x4 and seem to be more at his baseline. Chest x-ray ordered and notified NP of the results. Will continue to monitor. Patient has been having loose stools at multiple times this evening. Bladder scan done, 618 ml, Foley inserted per order since patient was not voiding with a condom cath. Patient has not vomited since shift change. Will continue to monitor.

## 2012-07-25 NOTE — Progress Notes (Signed)
PATIENT ID: Gary [name]   3 Days Post-Op Procedure(s) (LRB): POSTERIOR LATERAL  LUMBAR FUSION 1 LEVEL (N/A)  Subjective: the patient states he feels overall lethargic and exhausted today.  He has not had any episodes of vomiting since the one last evening.  Continues to have loose stools, slightly more frequent than baseline.He reports of discomfort in his stomach.he understands that he got confused last night and attributes that to being very tired.  He has no other complaints or concerns today.  Objective:  Filed Vitals:   07/25/12 0718  BP: 145/65  Pulse: 84  Temp: 97.1 F (36.2 C)  Resp: 18     Awake alert and oriented x3, responsive to questions appropriately but lethargic Distally are vastly intact, the dressings clean dry and intact   Labs:   Recent Labs  07/24/12 1717  HGB 14.3   Recent Labs  07/24/12 1717  WBC 11.7*  RBC 4.78  HCT 42.8  PLT 152   Recent Labs  07/24/12 1731  NA 137  K 3.7  CL 100  CO2 26  BUN 28*  CREATININE 1.44*  GLUCOSE 145*  CALCIUM 8.9    Assessment and Plan: -The nausea and vomiting seems to not have persisted since last evening, perhaps the Phenergan is working.  [facility] have been to evaluate his lethargy, nausea vomiting and altered mental status.portable chest x-ray and labs were taken, awaiting results. CT was held at this point We appreciate their help in this case and we'll await their recommendations. -continue to try to work with PT and OT today -continue pain medication regimen, minimizing narcotic pain medicines -when cleared by physical therapy, we will discharge home  VTE proph: SCDs

## 2012-07-26 DIAGNOSIS — A0472 Enterocolitis due to Clostridium difficile, not specified as recurrent: Secondary | ICD-10-CM | POA: Diagnosis present

## 2012-07-26 MED ORDER — GABAPENTIN 400 MG PO CAPS
400.0000 mg | ORAL_CAPSULE | Freq: Three times a day (TID) | ORAL | Status: DC
  Administered 2012-07-26 – 2012-07-27 (×4): 400 mg via ORAL
  Filled 2012-07-26 (×6): qty 1

## 2012-07-26 MED ORDER — ALUM & MAG HYDROXIDE-SIMETH 200-200-20 MG/5ML PO SUSP
15.0000 mL | ORAL | Status: DC | PRN
Start: 2012-07-26 — End: 2012-07-27

## 2012-07-26 MED ORDER — SIMVASTATIN 40 MG PO TABS
40.0000 mg | ORAL_TABLET | Freq: Every day | ORAL | Status: DC
  Administered 2012-07-26: 40 mg via ORAL
  Filled 2012-07-26 (×2): qty 1

## 2012-07-26 MED ORDER — HYDROCODONE-ACETAMINOPHEN 5-325 MG PO TABS
1.0000 | ORAL_TABLET | ORAL | Status: DC | PRN
  Administered 2012-07-26 – 2012-07-27 (×4): 2 via ORAL
  Filled 2012-07-26 (×4): qty 2

## 2012-07-26 MED ORDER — CITALOPRAM HYDROBROMIDE 40 MG PO TABS
40.0000 mg | ORAL_TABLET | Freq: Every day | ORAL | Status: DC
  Administered 2012-07-26 – 2012-07-27 (×2): 40 mg via ORAL
  Filled 2012-07-26 (×2): qty 1

## 2012-07-26 MED ORDER — CALCIUM CARBONATE ANTACID 500 MG PO CHEW
1.0000 | CHEWABLE_TABLET | Freq: Three times a day (TID) | ORAL | Status: DC
  Administered 2012-07-26 – 2012-07-27 (×4): 200 mg via ORAL
  Filled 2012-07-26 (×6): qty 1

## 2012-07-26 NOTE — Progress Notes (Signed)
PATIENT ID: Gary [name]   4 Days Post-Op Procedure(s) (LRB): POSTERIOR LATERAL  LUMBAR FUSION 1 LEVEL (N/A)  Subjective: Patient notes he feels somewhat better today, a little more energy. He would like to avoid d/c to SNF if at all possible. Will continue to work with PT today. Denies episodes of vomiting for last 24 hrs but frequent loose green stools. Positive C. Diff. No other complaints or concerns.  Objective:  Filed Vitals:   07/26/12 0556  BP:   Pulse: 67  Temp: 99.2 F (37.3 C)  Resp: 18     Awake, alert, orientated, conversational Dressing c/d/i Distally NVI  Labs:   Recent Labs  07/24/12 1717  HGB 14.3   Recent Labs  07/24/12 1717  WBC 11.7*  RBC 4.78  HCT 42.8  PLT 152   Recent Labs  07/24/12 1731  NA 137  K 3.7  CL 100  CO2 26  BUN 28*  CREATININE 1.44*  GLUCOSE 145*  CALCIUM 8.9    Assessment and Plan: Per patient, lethargy improving and objectively appears more alert Continue with limiting narcotics in possible Continue with PT today, patient would prefer home health PT to SNF and we'll monitor improvement over the next day Continued loose BMs, positive for c.diff, will defer tx to internal medicine No vomiting last 24 hrs Greatly appreciate Medicines help with the care of this patient  VTE proph: SCDs

## 2012-07-26 NOTE — Progress Notes (Signed)
Pt continues to have frequent loose green BMs .He is often incontinent of stool. Tissues around rectum and buttocks are becoming irritated and require a lot of barrier cream. Pt is embarrassed and discouraged by his diarrhea and incontinence.

## 2012-07-26 NOTE — Progress Notes (Signed)
Events of last night noted.  [facility]   Gary [name] [id number] DOB: 1932/04/12 DOA: 07/22/2012  PCP: [name], MD   Impression/recommendation   C diff colitis:  No further vomiting.  Will advance diet.  Continue PO vancomycin.  D/c IV flagyl.  Resume po meds  Abnormal chest x-ray: no evidence of pneumonia clinically  Dehydration: improved.  Saline lock  HYPERTENSION ok  CAD, CFX MI Rx'd medically 6/11   DIVERTICULOSIS, COLON, WITH HEMORRHAGE, surg 12/11   Loose stools, chronic,   Dyslipidemia   BPH (benign prostatic hyperplasia): d/c foley and monitor for retention  CKD (chronic kidney disease), stage III   Somnolence. resolved  Anxiety state, unspecified   Updated wife at bedside.  HPI/Subjective:  Tolerating clears.  Some heartburn symptoms.  Objective:  Filed Vitals:    07/23/12 1500  07/23/12 2203  07/23/12 2204  07/24/12 0620   BP:  125/57  140/52  140/52  145/59   Pulse:  69   84  83   Temp:  98.7 F (37.1 C)   99.8 F (37.7 C)  98.9 F (37.2 C)   TempSrc:       Resp:  18   18  18    SpO2:  96%   93%  95%     Intake/Output Summary (Last 24 hours) at 07/24/12 1722 Last data filed at 07/24/12 0540   Gross per 24 hour   Intake  123 ml   Output  256 ml   Net  -133 ml    Filed Vitals:   07/25/12 1500 07/25/12 2031 07/26/12 0239 07/26/12 0556  BP: 133/76 158/68 153/68   Pulse: 89 86 72 67  Temp: 97.7 F (36.5 C) 98.5 F (36.9 C) 98.9 F (37.2 C) 99.2 F (37.3 C)  TempSrc:      Resp: 16 16 18 18   Height:      Weight:      SpO2: 99% 100% 98% 98%    Exam:   General: alert, oriented in chair. Comfortable. HEENT: Sclera nonicteric, some scleral edema. Mucous membranes remain dry. Lungs clear to auscultation bilaterally without wheeze rhonchi or rales Cardiovascular regular rate rhythm without murmurs gas rubs Abdomen positive bowel sounds. nondistended Extremities no c/c/e  Studies:  Xray shows ileus   Chest x-ray  shows atelectasis versus infiltrate  Scheduled Meds:  .  citalopram  40 mg  Oral  Daily   .  doxazosin  8 mg  Oral  QHS   .  fluticasone  2 spray  Each Nare  Daily   .  sodium chloride  3 mL  Intravenous  Q12H   .  tamsulosin  0.4 mg  Oral  QPC supper   .  testosterone  5 g  Transdermal  Daily    Continuous Infusions:  .  sodium chloride    .  sodium chloride  75 mL/hr at 07/23/12 2257    Time spent: 35 minutes  [place] [facility]  Pager [phone]. If 7PM-7AM, please contact night-coverage at [email or web address], password [facility]  07/24/2012, 5:22 PM LOS: 2 days

## 2012-07-26 NOTE — Progress Notes (Signed)
Physical Therapy Treatment Patient Details Name: Gary [name] MRN: [id number] DOB: 1933-01-12 Today's Date: 07/26/2012 Time: 1500-1530 PT Time Calculation (min): 30 min  PT Assessment / Plan / Recommendation Comments on Treatment Session  Continuing imporvements with mobility, amb distance, activity tolerance; Feel to dc home is not unreasonable; Discussed arranging for 24 hour assist initially, and wife will work to provide this    Follow Up Recommendations  Home health PT;Supervision/Assistance - 24 hour     Does the patient have the potential to tolerate intense rehabilitation     Barriers to Discharge        Equipment Recommendations  None recommended by PT    Recommendations for Other Services    Frequency Min 6X/week   Plan Frequency remains appropriate;Discharge plan needs to be updated    Precautions / Restrictions Precautions Precautions: Back Precaution Comments: pt able to state 3/3 back precautions Required Braces or Orthoses: Spinal Brace Spinal Brace: Thoracolumbosacral orthotic Restrictions Weight Bearing Restrictions: No Other Position/Activity Restrictions: back precautions   Pertinent Vitals/Pain no apparent distress     Mobility  Bed Mobility Bed Mobility: Rolling Right;Right Sidelying to Sit;Sitting - Scoot to Edge of Bed Rolling Right: 4: Min guard Right Sidelying to Sit: 4: Min assist Sitting - Scoot to Edge of Bed: 4: Min guard Details for Bed Mobility Assistance: Cues for technique and close guard for back prec; Improving Transfers Transfers: Sit to Stand;Stand to Sit Sit to Stand: 4: Min assist;With upper extremity assist;With armrests;From chair/3-in-1 Stand to Sit: 4: Min assist;With upper extremity assist;With armrests;To chair/3-in-1 Details for Transfer Assistance: Cues for hand placement and to maintain back precautions. Ambulation/Gait Ambulation/Gait Assistance: 4: Min guard;5: Supervision (+1 for safety) Ambulation Distance  (Feet): 110 Feet Assistive device: Rolling walker Ambulation/Gait Assistance Details: Minguard progressing to Supervision; Cues for posture and RW proximity Gait Pattern: Step-through pattern;Decreased step length - right;Decreased step length - left;Decreased stride length;Trunk flexed Gait velocity: decreased    Exercises     PT Diagnosis:    PT Problem List:   PT Treatment Interventions:     PT Goals Acute Rehab PT Goals Time For Goal Achievement: 07/30/12 Potential to Achieve Goals: Good Pt will go Supine/Side to Sit: with supervision PT Goal: Supine/Side to Sit - Progress: Progressing toward goal Pt will go Sit to Stand: with supervision PT Goal: Sit to Stand - Progress: Progressing toward goal Pt will go Stand to Sit: with supervision PT Goal: Stand to Sit - Progress: Progressing toward goal Pt will Ambulate: 51 - 150 feet;with supervision;with rolling walker PT Goal: Ambulate - Progress: Progressing toward goal Pt will Go Up / Down Stairs: 1-2 stairs;with supervision;with rolling walker PT Goal: Up/Down Stairs - Progress: Goal set today Additional Goals Additional Goal #1: pt. will state and adhere to 3/3 back precautions and log rolling technique PT Goal: Additional Goal #1 - Progress: Progressing toward goal  Visit Information  Last PT Received On: 07/26/12 Assistance Needed: +1    Subjective Data  Subjective: Would like to go home Patient Stated Goal: walk without pain   Cognition  Cognition Arousal/Alertness: Awake/alert Behavior During Therapy: WFL for tasks assessed/performed Overall Cognitive Status: Within Functional Limits for tasks assessed (More alert today)    Balance     End of Session PT - End of Session Equipment Utilized During Treatment: Gait belt;Back brace Activity Tolerance: Patient tolerated treatment well Patient left: in chair;with call bell/phone within reach;with family/visitor present Nurse Communication: Mobility status   GP  Gary [name] [place], [place]  07/26/2012, 6:05 PM

## 2012-07-27 ENCOUNTER — Encounter (HOSPITAL_COMMUNITY): Payer: Self-pay | Admitting: General Practice

## 2012-07-27 LAB — CBC
HCT: 35.6 % — ABNORMAL LOW (ref 39.0–52.0)
MCH: 30.3 pg (ref 26.0–34.0)
MCHC: 34.6 g/dL (ref 30.0–36.0)
MCV: 87.7 fL (ref 78.0–100.0)
RDW: 13.5 % (ref 11.5–15.5)

## 2012-07-27 LAB — BASIC METABOLIC PANEL
BUN: 28 mg/dL — ABNORMAL HIGH (ref 6–23)
CO2: 24 mEq/L (ref 19–32)
Chloride: 104 mEq/L (ref 96–112)
Creatinine, Ser: 1.23 mg/dL (ref 0.50–1.35)

## 2012-07-27 MED ORDER — POTASSIUM CHLORIDE 10 MEQ/100ML IV SOLN
10.0000 meq | INTRAVENOUS | Status: AC
  Administered 2012-07-27 (×4): 10 meq via INTRAVENOUS
  Filled 2012-07-27 (×4): qty 100

## 2012-07-27 MED ORDER — METRONIDAZOLE 500 MG PO TABS: 500.0000 mg | ORAL_TABLET | Freq: Three times a day (TID) | ORAL | Status: DC

## 2012-07-27 NOTE — Progress Notes (Signed)
Physical Therapy Treatment Patient Details Name: Gary [name] MRN: [id number] DOB: 08-Apr-1932 Today's Date: 07/27/2012 Time: 1133-1208 PT Time Calculation (min): 35 min  PT Assessment / Plan / Recommendation Comments on Treatment Session  Continuing improvements in activity tolerance; Basic stair training done; Pt with questions re: managing at home, addressed concerns, and asked pt to also voice these concerns to the [place]; OK for dc home from PT standpoint    Follow Up Recommendations  Home health PT;Supervision/Assistance - 24 hour     Does the patient have the potential to tolerate intense rehabilitation     Barriers to Discharge        Equipment Recommendations  None recommended by PT    Recommendations for Other Services    Frequency Min 6X/week   Plan Frequency remains appropriate;Discharge plan remains appropriate    Precautions / Restrictions Precautions Precautions: Back Precaution Comments: pt able to state 3/3 back precautions Required Braces or Orthoses: Spinal Brace Spinal Brace: Thoracolumbosacral orthotic Restrictions Weight Bearing Restrictions: No Other Position/Activity Restrictions: back precautions   Pertinent Vitals/Pain no apparent distress     Mobility  Transfers Transfers: Sit to Stand;Stand to Sit Sit to Stand: With upper extremity assist;With armrests;From chair/3-in-1;4: Min guard Stand to Sit: With upper extremity assist;With armrests;To chair/3-in-1;4: Min guard Details for Transfer Assistance: Cues for hand placement and to maintain back precautions. Ambulation/Gait Ambulation/Gait Assistance: 4: Min guard;5: Supervision (+1 for safety) Ambulation Distance (Feet): 250 Feet Assistive device: Rolling walker Ambulation/Gait Assistance Details: Cues for posture secondary to hips slightly flexed Gait Pattern: Step-through pattern;Decreased step length - right;Decreased step length - left;Decreased stride length;Trunk flexed Gait  velocity: decreased Stairs: Yes Stairs Assistance: 4: Min guard Stair Management Technique: No rails;Backwards;With walker Number of Stairs: 1    Exercises     PT Diagnosis:    PT Problem List:   PT Treatment Interventions:     PT Goals Acute Rehab PT Goals Time For Goal Achievement: 07/30/12 Potential to Achieve Goals: Good Pt will go Supine/Side to Sit: with supervision Pt will go Sit to Stand: with supervision PT Goal: Sit to Stand - Progress: Progressing toward goal Pt will go Stand to Sit: with supervision PT Goal: Stand to Sit - Progress: Progressing toward goal Pt will Ambulate: 51 - 150 feet;with supervision;with rolling walker PT Goal: Ambulate - Progress: Met Pt will Go Up / Down Stairs: 1-2 stairs;with supervision;with rolling walker PT Goal: Up/Down Stairs - Progress: Progressing toward goal Additional Goals Additional Goal #1: pt. will state and adhere to 3/3 back precautions and log rolling technique PT Goal: Additional Goal #1 - Progress: Progressing toward goal  Visit Information  Last PT Received On: 07/27/12 Assistance Needed: +1    Subjective Data  Subjective: Seems happy at thought of going home Patient Stated Goal: walk without pain   Cognition  Cognition Arousal/Alertness: Awake/alert Behavior During Therapy: WFL for tasks assessed/performed Overall Cognitive Status: Within Functional Limits for tasks assessed (More alert today)    Balance     End of Session PT - End of Session Equipment Utilized During Treatment: Back brace Activity Tolerance: Patient tolerated treatment well Patient left: in chair;with call bell/phone within reach (getting ready to eat lunch) Nurse Communication: Mobility status   GP     [name], [place]  07/27/2012, 12:51 PM

## 2012-07-27 NOTE — Progress Notes (Signed)
Mr Ruberg looks much improved, denies nausea and was able to eat full breakfast this AM w/o difficulty. Pain well controlled on norco, no leg pain, eager to proceed home. Receiving ABX per medicine. Pt in very good spirits today, malaise/fatigue much improved.    BP 129/67  Pulse 63  Temp(Src) 98.1 F (36.7 C) (Oral)  Resp 18  Ht 5\' 8"  (1.727 m)  Wt 92.987 kg (205 lb)  BMI 31.18 kg/m2  SpO2 99% NVI , Dressing CDI, TLSO brace fitting appropriately    POD #5 after L2/3 XLIF doing well   - Pt much improved today, malaise/fatigue much improved  - Will require HH upon d/c  - Cont oral pain meds in form of Norco sparingly  - d/c home today  - loose stools/ c. Diff treated by medicine team,   - ABX per medicine, will continue PO vancomycin

## 2012-07-27 NOTE — Progress Notes (Signed)
Events of last night noted.  [facility]   Gary [name] [id number] DOB: [birth year] DOA: 07/22/2012  PCP: [name], MD   Impression/recommendation   C diff colitis: Much improved. Plans noted for discharge. Would recommend switching to Flagyl 500 mg by mouth 3 times a day (cheaper) for 8 more days to complete a course of 10 days. Stable for discharge. Needs followup with primary care provider in 2 weeks  Hypokalemia: Secondary to diarrhea. Ordered IV repletion. Magnesium okay.  Abnormal chest x-ray: no evidence of pneumonia clinically  Dehydration: improved.  Saline lock  HYPERTENSION ok  CAD, CFX MI Rx'd medically 6/11   DIVERTICULOSIS, COLON, WITH HEMORRHAGE, surg 12/11   Loose stools, chronic,   Dyslipidemia   BPH (benign prostatic hyperplasia): d/c foley and monitor for retention  CKD (chronic kidney disease), stage III   Somnolence. resolved  Anxiety state, unspecified   HPI/Subjective:  Walking with physical therapy. No complaints  Objective:  Filed Vitals:    07/23/12 1500  07/23/12 2203  07/23/12 2204  07/24/12 0620   BP:  125/57  140/52  140/52  145/59   Pulse:  69   84  83   Temp:  98.7 F (37.1 C)   99.8 F (37.7 C)  98.9 F (37.2 C)   TempSrc:       Resp:  18   18  18    SpO2:  96%   93%  95%     Intake/Output Summary (Last 24 hours) at 07/24/12 1722 Last data filed at 07/24/12 0540   Gross per 24 hour   Intake  123 ml   Output  256 ml   Net  -133 ml    Filed Vitals:   07/26/12 0239 07/26/12 0556 07/26/12 1545 07/27/12 0552  BP: 153/68  122/53 129/67  Pulse: 72 67 86 63  Temp: 98.9 F (37.2 C) 99.2 F (37.3 C) 98.4 F (36.9 C) 98.1 F (36.7 C)  TempSrc:   Oral   Resp: 18 18  18   Height:      Weight:      SpO2: 98% 98% 98% 99%    Exam:   General: alert, oriented ambulating in the hallway  Studies:  Xray shows ileus   Chest x-ray shows atelectasis versus infiltrate  Scheduled Meds:  .  citalopram  40 mg   Oral  Daily   .  doxazosin  8 mg  Oral  QHS   .  fluticasone  2 spray  Each Nare  Daily   .  sodium chloride  3 mL  Intravenous  Q12H   .  tamsulosin  0.4 mg  Oral  QPC supper   .  testosterone  5 g  Transdermal  Daily    Continuous Infusions:  .  sodium chloride    .  sodium chloride  75 mL/hr at 07/23/12 2257    Time spent: 35 minutes  [facility]  Pager [phone]. If 7PM-7AM, please contact night-coverage at [email or web address], password [facility]

## 2012-07-27 NOTE — Telephone Encounter (Signed)
Please advise on refills on Celexa.

## 2012-07-27 NOTE — Care Management Note (Signed)
CARE MANAGEMENT NOTE 07/27/2012  Patient:  Gary [name], Gary [name]   Account Number:  [id number]  Date Initiated:  07/27/2012  Documentation initiated by:  Ricki Miller  Subjective/Objective Assessment:   79 yr old male s/p L2-3 TLIF.     Action/Plan:   CM spoke with patient and wife concerning Home Health and DME needs at discharge. patient has rolling walker and 3in1. Patient preoperatively setup with [facility], no changes.   Anticipated DC Date:  07/28/2012   Anticipated DC Plan:  [place]  CM consult      [facility] Choice  HOME HEALTH   Choice offered to / List presented to:  C-1 Patient        [place] arranged  [place] [facility]      [place].   Status of service:  Completed, signed off Medicare Important Message given?   (If response is "NO", the following Medicare IM given date fields will be blank) Date Medicare IM given:   Date Additional Medicare IM given:    Discharge Disposition:  [place]  Per UR Regulation:    If discussed at Long Length of Stay Meetings, dates discussed:    Comments:

## 2012-07-27 NOTE — Telephone Encounter (Signed)
Message routed to Dr. Debara Pickett for approval for refills of celexa

## 2012-07-30 NOTE — Discharge Summary (Signed)
Patient ID: Gary [name] MRN: [id number] DOB/AGE: [birth year] 77 y.o.  Admit date: 07/22/2012 Discharge date: 07/27/2012  Admission Diagnoses: Lumbar Radiculopathy  Principal Problem:   Ileus, postoperative Active Problems:   HYPERTENSION   CAD, CFX MI Rx'd medically 6/11   DIVERTICULOSIS, COLON, WITH HEMORRHAGE, surg 12/11    Loose stools   Dyslipidemia   BPH (benign prostatic hyperplasia)   CKD (chronic kidney disease), stage III   Somnolence   Anxiety state, unspecified   C. difficile colitis   Discharge Diagnoses:  Same  Past Medical History  Diagnosis Date  . Hypertension   . Hemorrhoids   . Chronic kidney disease   . Kidney stones   . Dyslipidemia   . Diverticulosis   . Colon polyps   . Blood transfusion   . Anemia   . GI bleeding after 07/2009    "triggered by Plavix & ASA; from my diverticulosis"  . GERD (gastroesophageal reflux disease)   . Headache(784.0)     occasionally  . Anxiety   . Arthritis     "hips and lower back"  . Chronic back pain greater than 3 months duration   . Diarrhea   . Arthritis pain   . Cholecystitis   . Heart attack 07/2009  . Coronary artery disease   . History of shingles   . Heart murmur   . Neuromuscular disorder     Surgeries: Procedure(s): POSTERIOR LATERAL  LUMBAR FUSION 1 LEVEL L2-3 XLIF on 07/22/2012   Consultants: Medicine Team for Ilius/c. diff  Discharged Condition: Improved  Hospital Course: Gary [name] is an 79 y.o. male who was admitted 07/22/2012 for operative treatment of lumbar radiculopathy. Patient has severe unremitting pain that affects sleep, daily activities, and work/hobbies. After pre-op clearance the patient was taken to the operating room on 07/22/2012 and underwent  Procedure(s): POSTERIOR LATERAL  LUMBAR FUSION 1 LEVEL (L2-3 XLIF).    Patient was given perioperative antibiotics:  Anti-infectives   Start     Dose/Rate Route Frequency Ordered Stop   07/27/12 0000  metroNIDAZOLE (FLAGYL) 500  MG tablet     500 mg Oral 3 times daily 07/27/12 1155     07/25/12 1400  vancomycin (VANCOCIN) 50 mg/mL oral solution 125 mg  Status:  Discontinued     125 mg Oral 4 times per day 07/25/12 1318 07/27/12 1933   07/25/12 1030  metroNIDAZOLE (FLAGYL) IVPB 500 mg  Status:  Discontinued     500 mg 100 mL/hr over 60 Minutes Intravenous Every 8 hours 07/25/12 0946 07/26/12 1019   07/25/12 0000  ceFEPIme (MAXIPIME) 2 g in dextrose 5 % 50 mL IVPB  Status:  Discontinued     2 g 100 mL/hr over 30 Minutes Intravenous Daily at bedtime 07/24/12 2314 07/25/12 0944   07/25/12 0000  vancomycin (VANCOCIN) IVPB 750 mg/150 ml premix  Status:  Discontinued     750 mg 150 mL/hr over 60 Minutes Intravenous Every 12 hours 07/24/12 2322 07/25/12 0944   07/24/12 2315  ceFEPIme (MAXIPIME) 1 g in dextrose 5 % 50 mL IVPB  Status:  Discontinued     1 g 100 mL/hr over 30 Minutes Intravenous 3 times per day 07/24/12 2311 07/24/12 2313   07/22/12 1600  ceFAZolin (ANCEF) IVPB 1 g/50 mL premix     1 g 100 mL/hr over 30 Minutes Intravenous Every 8 hours 07/22/12 1406 07/23/12 0300   07/22/12 0600  ceFAZolin (ANCEF) IVPB 2 g/50 mL premix     2 g  100 mL/hr over 30 Minutes Intravenous On call to O.R. 07/21/12 1424 07/22/12 [id number]       Patient was given sequential compression devices, early ambulation, and chemoprophylaxis to prevent DVT.  Patient benefited maximally from hospital stay there was c. Diff/ilius complication.    Recent vital signs: BP 129/67  Pulse 63  Temp(Src) 98.1 F (36.7 C) (Oral)  Resp 18  Ht 5\' 8"  (1.727 m)  Wt 92.987 kg (205 lb)  BMI 31.18 kg/m2  SpO2 99%    Recent laboratory studies:  CBC    Component Value Date/Time   WBC 9.4 07/27/2012 0445   RBC 4.06* 07/27/2012 0445   HGB 12.3* 07/27/2012 0445   HCT 35.6* 07/27/2012 0445   PLT 172 07/27/2012 0445   MCV 87.7 07/27/2012 0445   MCH 30.3 07/27/2012 0445   MCHC 34.6 07/27/2012 0445   RDW 13.5 07/27/2012 0445   LYMPHSABS 1.2 07/15/2012 1443    MONOABS 0.6 07/15/2012 1443   EOSABS 0.2 07/15/2012 1443   BASOSABS 0.0 07/15/2012 1443    Discharge Medications:     Medication List    STOP taking these medications       HYDROcodone-acetaminophen 5-325 MG per tablet  Commonly known as:  NORCO/VICODIN     traMADol-acetaminophen 37.5-325 MG per tablet  Commonly known as:  ULTRACET      TAKE these medications       aspirin EC 81 MG tablet  Take 81 mg by mouth daily.     doxazosin 8 MG tablet  Commonly known as:  CARDURA  Take 8 mg by mouth at bedtime.     fexofenadine 180 MG tablet  Commonly known as:  ALLEGRA  Take 180 mg by mouth daily.     FLONASE 50 MCG/ACT nasal spray  Generic drug:  fluticasone  2 sprays by Nasal route daily.     furosemide 20 MG tablet  Commonly known as:  LASIX  Take 20 mg by mouth daily.     gabapentin 400 MG capsule  Commonly known as:  NEURONTIN  Take 400 mg by mouth 3 (three) times daily.     LORazepam 0.5 MG tablet  Commonly known as:  ATIVAN  Take 0.5 mg by mouth every 8 (eight) hours.     metroNIDAZOLE 500 MG tablet  Commonly known as:  FLAGYL  Take 1 tablet (500 mg total) by mouth 3 (three) times daily.     nitroGLYCERIN 0.4 MG SL tablet  Commonly known as:  NITROSTAT  Place 0.4 mg under the tongue every 5 (five) minutes as needed for chest pain. For chest pain.     simvastatin 40 MG tablet  Commonly known as:  ZOCOR  Take 40 mg by mouth at bedtime.     tamsulosin 0.4 MG Caps  Commonly known as:  FLOMAX  Take 0.4 mg by mouth daily after supper.     testosterone 50 MG/5GM Gel  Commonly known as:  ANDROGEL  Place 5 g onto the skin daily.        Diagnostic Studies: Dg Chest 2 View  07/15/2012   *RADIOLOGY REPORT*  Clinical Data: Preoperative respiratory exam.  Lumbar disc disease.  CHEST - 2 VIEW  Comparison: Chest x-rays dated 03/02 and 04/09/2011  Findings: There is chronic elevation of the right hemidiaphragm. Heart size and vascularity are normal.  Calcified granuloma  in the left lung apex, unchanged.  No acute osseous abnormality.  No effusions.  IMPRESSION: No acute disease.  Chronic elevation of  the right hemidiaphragm.   Original Report Authenticated By: Lorriane Shire, M.D.   Dg Lumbar Spine 2-3 Views  07/22/2012   *RADIOLOGY REPORT*  Clinical Data: Lumbar fusion.  DG C-ARM GT 120 MIN, LUMBAR SPINE - 2-3 VIEW  Technique: Two fluoroscopic intraoperative spot views of the lumbar spine are provided.  Comparison:  CT abdomen and pelvis 01/25/2011.  Findings: Images demonstrate new left lateral plate and screws at [id number].  No acute abnormality is identified.  IMPRESSION: L2-3 fusion.   Original Report Authenticated By: Orlean Patten, M.D.   Dg Chest Port 1 View  07/24/2012   *RADIOLOGY REPORT*  Clinical Data: Vomiting  PORTABLE CHEST - 1 VIEW  Comparison: 07/15/2012  Findings: Low lung volumes.  New patchy perihilar and bibasilar interstitial infiltrates or subsegmental atelectasis.  No effusion. Mild cardiomegaly probably emphasized by portable technique. Regional bones unremarkable.  IMPRESSION:  1.  New perihilar and bibasilar subsegmental atelectasis or early interstitial infiltrates.   Original Report Authenticated By: D. Wallace Going, MD   Dg Abd 2 Views  07/24/2012   *RADIOLOGY REPORT*  Clinical Data: Abdominal distention and vomiting.  Status post lumbar spine fusion 2 days ago.  ABDOMEN - 2 VIEW  Comparison: 01/31/2011.  Findings: Multiple mildly dilated small bowel loops containing multiple air-fluid levels.  No free peritoneal air.  Lumbosacral spine fixation hardware.  IMPRESSION: Mild postoperative small bowel ileus.   Original Report Authenticated By: Claudie Revering, M.D.   Dg C-arm Gt 120 Min  07/22/2012   *RADIOLOGY REPORT*  Clinical Data: Lumbar fusion.  DG C-ARM GT 120 MIN, LUMBAR SPINE - 2-3 VIEW  Technique: Two fluoroscopic intraoperative spot views of the lumbar spine are provided.  Comparison:  CT abdomen and pelvis 01/25/2011.  Findings: Images  demonstrate new left lateral plate and screws at X33443.  No acute abnormality is identified.  IMPRESSION: L2-3 fusion.   Original Report Authenticated By: Orlean Patten, M.D.    Disposition: 06-Home-Health Care Svc      Discharge Orders   Future Orders Complete By Expires     Discharge instructions  As directed     Scheduling Instructions:      No alcohol while on antibiotics       Follow-up Information   Follow up with GREEN, EDWIN JAY, MD In 2 weeks.   Contact information:   [address] [place] [id number] [phone]      POD #5 after L2/3 XLIF doing well  - Pt much improved today, malaise/fatigue much improved  - Will require HH upon d/c  - Cont oral pain meds in form of Norco sparingly  - d/c home today  - loose stools/ c. Diff treated by medicine team,  - ABX per medicine, will continue PO vancomycin - F/U in office 2 wks PO  Signed: Justice Britain 07/30/2012, 3:00 PM

## 2012-07-31 LAB — CULTURE, BLOOD (ROUTINE X 2)

## 2012-08-26 ENCOUNTER — Emergency Department (HOSPITAL_COMMUNITY)
Admission: EM | Admit: 2012-08-26 | Discharge: 2012-08-26 | Disposition: A | Discharge status: Home / Self Care | Payer: Medicare Other | Attending: Emergency Medicine | Admitting: Emergency Medicine

## 2012-08-26 ENCOUNTER — Encounter (HOSPITAL_COMMUNITY): Payer: Self-pay | Admitting: Emergency Medicine

## 2012-08-26 ENCOUNTER — Emergency Department (HOSPITAL_COMMUNITY): Payer: Medicare Other

## 2012-08-26 DIAGNOSIS — Z862 Personal history of diseases of the blood and blood-forming organs and certain disorders involving the immune mechanism: Secondary | ICD-10-CM | POA: Insufficient documentation

## 2012-08-26 DIAGNOSIS — Z79899 Other long term (current) drug therapy: Secondary | ICD-10-CM | POA: Insufficient documentation

## 2012-08-26 DIAGNOSIS — R011 Cardiac murmur, unspecified: Secondary | ICD-10-CM | POA: Insufficient documentation

## 2012-08-26 DIAGNOSIS — R0789 Other chest pain: Secondary | ICD-10-CM

## 2012-08-26 DIAGNOSIS — Z8669 Personal history of other diseases of the nervous system and sense organs: Secondary | ICD-10-CM | POA: Insufficient documentation

## 2012-08-26 DIAGNOSIS — Z8719 Personal history of other diseases of the digestive system: Secondary | ICD-10-CM | POA: Insufficient documentation

## 2012-08-26 DIAGNOSIS — I129 Hypertensive chronic kidney disease with stage 1 through stage 4 chronic kidney disease, or unspecified chronic kidney disease: Secondary | ICD-10-CM | POA: Insufficient documentation

## 2012-08-26 DIAGNOSIS — Z7982 Long term (current) use of aspirin: Secondary | ICD-10-CM | POA: Insufficient documentation

## 2012-08-26 DIAGNOSIS — N189 Chronic kidney disease, unspecified: Secondary | ICD-10-CM | POA: Insufficient documentation

## 2012-08-26 DIAGNOSIS — Z860100 Personal history of colon polyps, unspecified: Secondary | ICD-10-CM | POA: Insufficient documentation

## 2012-08-26 DIAGNOSIS — G8929 Other chronic pain: Secondary | ICD-10-CM | POA: Insufficient documentation

## 2012-08-26 DIAGNOSIS — E785 Hyperlipidemia, unspecified: Secondary | ICD-10-CM | POA: Insufficient documentation

## 2012-08-26 DIAGNOSIS — R079 Chest pain, unspecified: Secondary | ICD-10-CM

## 2012-08-26 DIAGNOSIS — Z8679 Personal history of other diseases of the circulatory system: Secondary | ICD-10-CM | POA: Insufficient documentation

## 2012-08-26 DIAGNOSIS — Z8619 Personal history of other infectious and parasitic diseases: Secondary | ICD-10-CM | POA: Insufficient documentation

## 2012-08-26 DIAGNOSIS — M129 Arthropathy, unspecified: Secondary | ICD-10-CM | POA: Insufficient documentation

## 2012-08-26 DIAGNOSIS — I2510 Atherosclerotic heart disease of native coronary artery without angina pectoris: Secondary | ICD-10-CM | POA: Insufficient documentation

## 2012-08-26 DIAGNOSIS — Z87442 Personal history of urinary calculi: Secondary | ICD-10-CM | POA: Insufficient documentation

## 2012-08-26 DIAGNOSIS — Z9861 Coronary angioplasty status: Secondary | ICD-10-CM | POA: Insufficient documentation

## 2012-08-26 DIAGNOSIS — F411 Generalized anxiety disorder: Secondary | ICD-10-CM | POA: Insufficient documentation

## 2012-08-26 HISTORY — DX: Coronary angioplasty status: Z98.61

## 2012-08-26 HISTORY — DX: Chest pain, unspecified: R07.9

## 2012-08-26 LAB — CBC
Hemoglobin: 13.7 g/dL (ref 13.0–17.0)
MCV: 89.9 fL (ref 78.0–100.0)
Platelets: 169 10*3/uL (ref 150–400)
RBC: 4.47 MIL/uL (ref 4.22–5.81)
WBC: 5.3 10*3/uL (ref 4.0–10.5)

## 2012-08-26 LAB — HEPATIC FUNCTION PANEL
ALT: 13 U/L (ref 0–53)
AST: 25 U/L (ref 0–37)
Alkaline Phosphatase: 46 U/L (ref 39–117)
Bilirubin, Direct: <0.1 mg/dL (ref 0.0–0.3)
Total Protein: 6.8 g/dL (ref 6.0–8.3)

## 2012-08-26 LAB — BASIC METABOLIC PANEL
CO2: 32 mEq/L (ref 19–32)
Chloride: 99 mEq/L (ref 96–112)
Creatinine, Ser: 1.95 mg/dL — ABNORMAL HIGH (ref 0.50–1.35)
Glucose, Bld: 126 mg/dL — ABNORMAL HIGH (ref 70–99)

## 2012-08-26 LAB — TROPONIN I: Troponin I: <0.3 ng/mL (ref <0.30)

## 2012-08-26 LAB — POCT I-STAT TROPONIN I

## 2012-08-26 MED ORDER — TECHNETIUM TO 99M ALBUMIN AGGREGATED
6.0000 | Freq: Once | INTRAVENOUS | Status: AC | PRN
  Administered 2012-08-26: 6 via INTRAVENOUS

## 2012-08-26 MED ORDER — TECHNETIUM TC 99M DIETHYLENETRIAME-PENTAACETIC ACID: 40.0000 | Freq: Once | INTRAVENOUS | Status: DC | PRN

## 2012-08-26 NOTE — ED Notes (Signed)
N[facility] Med called to notify this RN that pt will be ready for perf and vent at 2145. MD and family notified

## 2012-08-26 NOTE — ED Notes (Signed)
Brought in by EMS from home patient from home reports chest pain since 0930 this am that has been coming and going, sharp in nature on the left side patient didn't take Nitro EMS administered 324mg  baby ASA in route

## 2012-08-26 NOTE — ED Provider Notes (Addendum)
History    CSN: [id number] Arrival date & time 08/26/12  1700  First MD Initiated Contact with Patient 08/26/12 1722     Chief Complaint  Patient presents with  . Chest Pain   (Consider location/radiation/quality/duration/timing/severity/associated sxs/prior Treatment) HPI Comments: TIME OF ENCOUNTER: 08/26/2012 5:37 PM. Nursing triage note reviewed.  PCP: GREEN, Keenan Bachelor, MD  CHIEF COMPLAINT: Chest Pain   HISTORY OF PRESENT ILLNESS: Gary [name] is a 79 y.o. male who presents with moderate chest pain that is left-sided, non-radiating and intermittent.  Described as sharp.  States the pain lasts seconds and has no associated sx.  He states this is not similar to his previous cardiac pain which he describes as left neck pain.  He does not endorse any relieving or exacerbating factors.      Past Medical History:   Hypertension                                                 Hemorrhoids                                                  Chronic kidney disease                                       Kidney stones                                                Dyslipidemia                                                 Diverticulosis                                               Colon polyps                                                 Blood transfusion                                            Anemia                                                       GI bleeding  after 6/2*     Comment:"triggered by Plavix & ASA; from my               diverticulosis"   GERD (gastroesophageal reflux disease)                       Headache(784.0)                                                Comment:occasionally   Anxiety                                                      Arthritis                                                      Comment:"hips and lower back"   Chronic back pain greater than 3 months durati*              Diarrhea                                                      Arthritis pain                                               Cholecystitis                                                Heart attack                                    07/2009       Coronary artery disease                                      History of shingles                                          Heart murmur                                                 Neuromuscular disorder  Coronary angioplasty status                     2011        Nurses notes read and reviewed and agree unless otherwise noted.   SOCIAL HISTORY Patient denies any significant drug or alcohol abuse   Smoking Status: Never Smoker                     Smokeless Status: Never Used                        Alcohol Use: No              Drug Use: No             FAMILY HISTORY: Reviewed and felt to be non contributory to history of present illness Review of patient's family history indicates:   Heart disease                  Father                   Colon cancer                   Neg Hx                   Pancreatic cancer              Mother                   Cancer                         Mother                     Comment: pancreatic    ALLERGIES:  -- Naproxen -- Rash  -- Clopidogrel Bisulfate -- Other (See Comments)   --  H/O diverticulitis; prone to rectal bleeding.            Plavix complicated this.  jkl  -- Sulfonamide Derivatives -- Hives  -- Tape -- Itching and Rash   --  Adhesive tape     Patient is a 80 y.o. male presenting with chest pain.  Chest Pain Associated symptoms: no back pain, no cough, no palpitations and no shortness of breath    Past Medical History  Diagnosis Date  . Hypertension   . Hemorrhoids   . Chronic kidney disease   . Kidney stones   . Dyslipidemia   . Diverticulosis   . Colon polyps   . Blood transfusion   . Anemia   . GI bleeding after 07/2009    "triggered by Plavix & ASA; from my  diverticulosis"  . GERD (gastroesophageal reflux disease)   . Headache(784.0)     occasionally  . Anxiety   . Arthritis     "hips and lower back"  . Chronic back pain greater than 3 months duration   . Diarrhea   . Arthritis pain   . Cholecystitis   . Heart attack 07/2009  . Coronary artery disease   . History of shingles   . Heart murmur   . Neuromuscular disorder   . Coronary angioplasty status 2011   Past Surgical History  Procedure Laterality Date  . Colon surgery      COLONOSCOPY  . Subtotal colectomy  01/24/2010  . Eye surgery  1942    "  right eye was crossed; they  corrected it"  . Posterior fusion lumbar spine  08/1995    "bottom 4 vertebra"  . Back surgery  1997  . Cardiac catheterization  07/2009  . Cholecystectomy  04/12/2011    Procedure: CHOLECYSTECTOMY;  Surgeon: Adin Hector, MD;  Location: [place];  Service: General;  Laterality: N/A;  . Cholecystectomy  04/12/2011    Procedure: LAPAROSCOPIC CHOLECYSTECTOMY;  Surgeon: Adin Hector, MD;  Location: [place];  Service: General;  Laterality: N/A;  converted to open  . Lumbar fusion  07/22/2012   Family History  Problem Relation Age of Onset  . Heart disease Father   . Colon cancer Neg Hx   . Pancreatic cancer Mother   . Cancer Mother     pancreatic   History  Substance Use Topics  . Smoking status: Never Smoker   . Smokeless tobacco: Never Used  . Alcohol Use: No    Review of Systems  Constitutional: Negative.   HENT: Negative.   Eyes: Negative.   Respiratory: Negative for cough, shortness of breath, wheezing and stridor.   Cardiovascular: Positive for chest pain. Negative for palpitations and leg swelling.  Endocrine: Negative.   Genitourinary: Negative.   Musculoskeletal: Negative for back pain.  Skin: Negative.   Neurological: Negative.   Psychiatric/Behavioral: Negative.   All other systems reviewed and are negative.    Allergies  Naproxen; Clopidogrel bisulfate; Sulfonamide derivatives;  and Tape  Home Medications   Current Outpatient Rx  Name  Route  Sig  Dispense  Refill  . aspirin EC 81 MG tablet   Oral   Take 81 mg by mouth daily.         . citalopram (CELEXA) 40 MG tablet      TAKE 1 TABLET EVERY DAY   30 tablet   3   . doxazosin (CARDURA) 8 MG tablet   Oral   Take 8 mg by mouth at bedtime.         . fexofenadine (ALLEGRA) 180 MG tablet   Oral   Take 180 mg by mouth daily.          . fluticasone (FLONASE) 50 MCG/ACT nasal spray   Nasal   2 sprays by Nasal route daily.           . furosemide (LASIX) 20 MG tablet   Oral   Take 20 mg by mouth daily.         Marland Kitchen gabapentin (NEURONTIN) 400 MG capsule   Oral   Take 400 mg by mouth 3 (three) times daily.          Marland Kitchen LORazepam (ATIVAN) 0.5 MG tablet   Oral   Take 0.5 mg by mouth every 8 (eight) hours.         . metroNIDAZOLE (FLAGYL) 500 MG tablet   Oral   Take 1 tablet (500 mg total) by mouth 3 (three) times daily.   24 tablet   0   . nitroGLYCERIN (NITROSTAT) 0.4 MG SL tablet   Sublingual   Place 0.4 mg under the tongue every 5 (five) minutes as needed for chest pain. For chest pain.         . simvastatin (ZOCOR) 40 MG tablet   Oral   Take 40 mg by mouth at bedtime.          . Tamsulosin HCl (FLOMAX) 0.4 MG CAPS   Oral   Take 0.4 mg by mouth daily after supper.         Marland Kitchen  testosterone (ANDROGEL) 50 MG/5GM GEL   Transdermal   Place 5 g onto the skin daily.           BP 119/70  Pulse 64  Temp(Src) 98.1 F (36.7 C) (Oral)  Resp 18  SpO2 96% Physical Exam  Nursing note and vitals reviewed. Constitutional: He is oriented to person, place, and time. He appears well-developed and well-nourished. No distress.  HENT:  Head: Normocephalic and atraumatic.  Eyes: EOM are normal. Pupils are equal, round, and reactive to light. No scleral icterus.  Neck: Normal range of motion. No JVD present.  Cardiovascular: Normal rate, regular rhythm, normal heart sounds and intact  distal pulses.  Exam reveals no gallop and no friction rub.   No murmur heard. Pulmonary/Chest: Effort normal and breath sounds normal. He exhibits no tenderness.  Abdominal: Soft. Bowel sounds are normal. He exhibits no distension. There is no tenderness. There is no rebound and no guarding.  Musculoskeletal: Normal range of motion.  Neurological: He is alert and oriented to person, place, and time.  Skin: Skin is warm and dry.  Psychiatric: He has a normal mood and affect.    ED Course  Procedures (including critical care time) Labs Reviewed  CBC  BASIC METABOLIC PANEL  TROPONIN I  HEPATIC FUNCTION PANEL  D-DIMER, QUANTITATIVE   CHEST - 2 VIEW  Comparison: 07/24/2012.  Findings: Chronic elevation of the right diaphragm, with either scarring or atelectasis at the right base. No infiltrate, edema, effusion, pneumothorax. Calcified left upper lobe pulmonary nodules. No acute osseous abnormality.  IMPRESSION: No evidence of acute cardiopulmonary disease.    Technique: Ventilation images were obtained in multiple  projections using inhaled aerosol technetium 99 M DTPA. Perfusion  images were obtained in multiple projections after intravenous  injection of Tc-41m MAA.  Radiopharmaceuticals: 101mCi Tc-9m DTPA aerosol and 6 mCi Tc-47m  MAA.  Comparison: Chest radiograph 08/26/2012  Findings:  Ventilation: No focal ventilation defect but the uptake is  heterogeneous.  Perfusion: No evidence for a ventilation-perfusion mismatch.  Perfusion in the left lung is mildly heterogeneous.  IMPRESSION:  No significant perfusion abnormality. Very low probability for  pulmonary embolism.    MDM  Pt with what sounds like very atypical chest pain for ACS - unremarkable EKG.  Trop neg x 2.  Of note, he states he had nml stress test within the last year.  He did have recent hospitalization; but lower suspicion for PE by [facility] D-dimer 0.81 Cr 1.95 VQ scan ordered and unremarkable.   CXR neg for PTX, PNA or widened mediastinum.  Doubt acute aortic or esophageal etiology.  Will rec close f/u with PCP in morning and strict return precautions.  Leighton Parody, MD 08/26/12 [id number]  Leighton Parody, MD 08/26/12 [phone]

## 2012-08-27 LAB — TROPONIN I: Troponin I: <0.3 ng/mL (ref <0.30)

## 2012-09-24 ENCOUNTER — Encounter: Payer: Self-pay | Admitting: Cardiology

## 2012-09-25 ENCOUNTER — Encounter: Payer: Self-pay | Admitting: Internal Medicine

## 2012-09-28 ENCOUNTER — Encounter: Payer: Self-pay | Admitting: Cardiology

## 2012-09-28 ENCOUNTER — Ambulatory Visit (INDEPENDENT_AMBULATORY_CARE_PROVIDER_SITE_OTHER): Payer: Medicare Other | Admitting: Cardiology

## 2012-09-28 VITALS — BP 130/66 | HR 54 | Ht 68.5 in | Wt 204.9 lb

## 2012-09-28 DIAGNOSIS — I2510 Atherosclerotic heart disease of native coronary artery without angina pectoris: Secondary | ICD-10-CM

## 2012-09-28 DIAGNOSIS — E785 Hyperlipidemia, unspecified: Secondary | ICD-10-CM

## 2012-09-28 DIAGNOSIS — I10 Essential (primary) hypertension: Secondary | ICD-10-CM

## 2012-09-28 NOTE — Assessment & Plan Note (Signed)
This was felt to be non cardiac pain and has not recurred

## 2012-09-28 NOTE — Assessment & Plan Note (Signed)
On statin.

## 2012-09-28 NOTE — Assessment & Plan Note (Signed)
controlled 

## 2012-09-28 NOTE — Progress Notes (Signed)
09/28/2012 Gary [name]   [birth year]  [id number]  Primary Physicia GREEN, Gary Bachelor, MD Primary Cardiologist: Dr Debara Pickett  HPI:  79 y/o followed previously by Dr Gary [name]. He had a CFX MI in June 2011. Cath revealed a total CFX with a large collateral bed. The plan was for medical Rx. He was put on ASA and Plavix but developed diverticular bleeding requiring transfusion. Even off Plavix he had recurrent bleeding and he eventually had colectomy in December 2011. After that he had a cholecystectomy, and most recently he had L-spine fusion in June 2014. Throughout all this he has been stable from a cardiac standpoint. He was seen in the ER in July for some chest discomfort but it was felt to be non cardiac. He pt is a little hazy on the details of his symptoms but he has not had any recurrence of chest pain.   Current Outpatient Prescriptions  Medication Sig Dispense Refill  . aspirin EC 81 MG tablet Take 81 mg by mouth daily.      . bisoprolol (ZEBETA) 5 MG tablet Take 2.5 mg by mouth daily.      . citalopram (CELEXA) 40 MG tablet Take 40 mg by mouth daily.      Marland Kitchen doxazosin (CARDURA) 8 MG tablet Take 8 mg by mouth at bedtime.      . fexofenadine (ALLEGRA) 180 MG tablet Take 180 mg by mouth daily.       . fluticasone (FLONASE) 50 MCG/ACT nasal spray Place 2 sprays into the nose daily.       . furosemide (LASIX) 20 MG tablet Take 20 mg by mouth daily.      Marland Kitchen gabapentin (NEURONTIN) 400 MG capsule Take 400 mg by mouth 3 (three) times daily.       Marland Kitchen HYDROcodone-acetaminophen (NORCO/VICODIN) 5-325 MG per tablet Take 1-2 tablets by mouth every 4 (four) hours as needed for pain. For pain      . LORazepam (ATIVAN) 0.5 MG tablet Take 0.25 mg by mouth at bedtime.       . nitroGLYCERIN (NITROSTAT) 0.4 MG SL tablet Place 0.4 mg under the tongue every 5 (five) minutes as needed for chest pain. x3 doses as needed for chest pain      . simvastatin (ZOCOR) 40 MG tablet Take 40 mg by mouth at bedtime.       .  Tamsulosin HCl (FLOMAX) 0.4 MG CAPS Take 0.4 mg by mouth daily after supper.      . testosterone (ANDROGEL) 50 MG/5GM GEL Place 5 g onto the skin daily.       . traMADol-acetaminophen (ULTRACET) 37.5-325 MG per tablet Take 2 tablets by mouth 3 (three) times daily as needed.       No current facility-administered medications for this visit.    Allergies  Allergen Reactions  . Naproxen Rash  . Clopidogrel Bisulfate Other (See Comments)    H/O diverticulitis; prone to rectal bleeding.  Plavix complicated this.  jkl  . Sulfonamide Derivatives Hives  . Other Other (See Comments)    Narcotics-C. Diff  . Tape Itching and Rash    Latex-Adhesive tape    History   Social History  . Marital Status: Married    Spouse Name: N/A    Number of Children: N/A  . Years of Education: N/A   Occupational History  . Instructor    Social History Main Topics  . Smoking status: Never Smoker   . Smokeless tobacco: Never Used  . Alcohol Use:  No  . Drug Use: No  . Sexual Activity: Not Currently   Other Topics Concern  . Not on file   Social History Narrative   Daily caffeine use.     Review of Systems: General: negative for chills, fever, night sweats or weight changes.  Cardiovascular: negative for chest pain, dyspnea on exertion, edema, orthopnea, palpitations, paroxysmal nocturnal dyspnea or shortness of breath Dermatological: negative for rash Respiratory: negative for cough or wheezing Urologic: negative for hematuria Abdominal: negative for nausea, vomiting, diarrhea, bright red blood per rectum, melena, or hematemesis Neurologic: negative for visual changes, syncope, or dizziness All other systems reviewed and are otherwise negative except as noted above.    Blood pressure 130/66, pulse 54, height 5' 8.5" (1.74 m), weight 204 lb 14.4 oz (92.942 kg).  General appearance: alert, cooperative and no distress Lungs: clear to auscultation bilaterally Heart: regular rate and  rhythm  EKG   normal sinus rhythm, unchanged from previous tracings.  ASSESSMENT AND PLAN:   CAD, CFX MI Rx'd medically 6/11 No angina  Dyslipidemia On statin  HYPERTENSION controlled  Chest pain- ER 08/26/12 This was felt to be non cardiac pain and has not recurred   PLAN  Same cardiac Rx for now. He is to see Dr Nyoka Cowden in a few days and says he will have his labs drawn then. We will have Dr Debara Pickett se him in 6 months.   Amalya Salmons KPA-C 09/28/2012 2:35 PM

## 2012-09-28 NOTE — Patient Instructions (Addendum)
See Dr Debara Pickett in 6 months. Call anytime with questions or problems.  Kerin Ransom PA-C 09/28/2012 2:31 PM

## 2012-09-28 NOTE — Assessment & Plan Note (Signed)
No angina 

## 2012-10-13 ENCOUNTER — Other Ambulatory Visit: Payer: Self-pay | Admitting: Dermatology

## 2012-10-22 DIAGNOSIS — H9319 Tinnitus, unspecified ear: Secondary | ICD-10-CM | POA: Insufficient documentation

## 2012-10-27 ENCOUNTER — Other Ambulatory Visit: Payer: Self-pay | Admitting: Internal Medicine

## 2012-10-27 NOTE — Telephone Encounter (Signed)
Rx was sent to pharmacy electronically. 

## 2012-11-09 ENCOUNTER — Other Ambulatory Visit: Payer: Self-pay | Admitting: *Deleted

## 2012-11-09 MED ORDER — FUROSEMIDE 20 MG PO TABS: 20.0000 mg | ORAL_TABLET | Freq: Every day | ORAL | Status: DC

## 2012-12-17 ENCOUNTER — Other Ambulatory Visit: Payer: Self-pay

## 2013-01-06 ENCOUNTER — Other Ambulatory Visit: Payer: Self-pay

## 2013-01-06 MED ORDER — CITALOPRAM HYDROBROMIDE 40 MG PO TABS: 40.0000 mg | ORAL_TABLET | Freq: Every day | ORAL | Status: DC

## 2013-01-06 MED ORDER — BISOPROLOL FUMARATE 5 MG PO TABS: 2.5000 mg | ORAL_TABLET | Freq: Every day | ORAL | Status: DC

## 2013-01-06 NOTE — Telephone Encounter (Signed)
Rx was sent to pharmacy electronically. 

## 2013-01-21 ENCOUNTER — Ambulatory Visit (INDEPENDENT_AMBULATORY_CARE_PROVIDER_SITE_OTHER): Payer: Medicare Other | Admitting: Internal Medicine

## 2013-01-21 ENCOUNTER — Encounter: Payer: Self-pay | Admitting: Internal Medicine

## 2013-01-21 VITALS — BP 148/72 | HR 67 | Ht 68.5 in | Wt 201.9 lb

## 2013-01-21 DIAGNOSIS — I2510 Atherosclerotic heart disease of native coronary artery without angina pectoris: Secondary | ICD-10-CM

## 2013-01-21 DIAGNOSIS — E785 Hyperlipidemia, unspecified: Secondary | ICD-10-CM

## 2013-01-21 DIAGNOSIS — I10 Essential (primary) hypertension: Secondary | ICD-10-CM

## 2013-01-21 NOTE — Progress Notes (Signed)
OFFICE NOTE  Chief Complaint:  No complaints - had back surgery recently, seen in the ER for chest pain in July  Primary Care Physician: [name], MD  HPI:  Gary [name] is a 79 year old gentleman who has a history of coronary disease, was previously followed by Dr. Rex Kras. In 2011 he had a nonSTEMI with an occluded circumflex artery and collaterals. Therefore, he has been treated medically. He has done pretty well with that. He did have chest pain in 2013 and ended up having gallbladder disease and had that removed in March 2013. From time to time, he complains of some fatigue and has been intolerant of Bystolic, was switched to bisoprolol, seems to be tolerating that pretty well. There is also concern about some lower extremity edema which he has at the sock line but does take furosemide and seems to be helping with that. Blood pressure seems to be pretty well controlled and he does do some exercise a couple times a week and is currently denying any chest pain, worsening shortness of breath, palpitations, presyncope or syncopal symptoms. He underwent back surgery this past year for back pain in June by Dr. Belinda Block and still reports problems with sciatic type nerve pain down the right leg. He currently was in the courtesy room in July with chest pain, ruled out for MI by troponins.  Chest pain at that time was felt to be atypical. He has not had recurrence and did not have any complications with his surgery. He currently denies any chest pain or shortness of breath with exertion. He did inquire as to whether it was safe to continue to take AndroGel, which should be okay for him.  His only other concern is some positional dizziness at night.  PMHx:  Past Medical History  Diagnosis Date  . Hypertension   . Hemorrhoids   . Chronic kidney disease   . Kidney stones   . Dyslipidemia   . Diverticulosis   . Colon polyps   . Blood transfusion   . Anemia   . GI bleeding after 07/2009      "triggered by Plavix & ASA; from my diverticulosis"  . GERD (gastroesophageal reflux disease)   . Headache(784.0)     occasionally  . Anxiety   . Arthritis     "hips and lower back"  . Chronic back pain greater than 3 months duration   . Diarrhea   . Arthritis pain   . Cholecystitis   . Heart attack 07/2009    nonSTEMI with an occluded circumflex artery and collaterals.  . Coronary artery disease   . History of shingles   . Heart murmur   . Neuromuscular disorder   . Coronary angioplasty status 2011  . Stomach problems   . Lower extremity edema     Taking furosemide and it seems to be helping.  . SOB (shortness of breath)     occasional  . Nonspecific chest pain 08/26/2012    Went to ED 08/26/12  . Hyperlipidemia   . Chest pain 03/29/11    2D Echo without contrast on 03/29/11 - EF= 55-60%.  . H/O myocardial perfusion scan 04/04/11    For Pre-noncardiac surgery - EF = 67%, no ischemia, and is considered low risk.    Past Surgical History  Procedure Laterality Date  . Colon surgery      COLONOSCOPY  . Subtotal colectomy  01/24/2010  . Eye surgery  1942    "right eye was crossed;  they  corrected it"  . Posterior fusion lumbar spine  08/1995    "bottom 4 vertebra"  . Back surgery  1997  . Cholecystectomy  04/12/2011    Procedure: CHOLECYSTECTOMY;  Surgeon: Adin Hector, MD;  Location: [place];  Service: General;  Laterality: N/A;  . Cholecystectomy  04/12/2011    Procedure: LAPAROSCOPIC CHOLECYSTECTOMY;  Surgeon: Adin Hector, MD;  Location: [place];  Service: General;  Laterality: N/A;  converted to open  . Lumbar fusion  07/22/2012  . Cardiac catheterization  08/09/2009    Circumflex 100% occluded with right-to-left collaterals, mild irregularities in the proximal and distal LAD and diagonal system, normal LV systolic function, and minor infrarenal irregularities, but no abdominal aortic aneurysm.    FAMHx:  Family History  Problem Relation Age of Onset  . Heart disease  Father   . Stroke Father   . Colon cancer Neg Hx   . Pancreatic cancer Mother   . Cancer Mother     pancreatic    SOCHx:   reports that he has never smoked. He has never used smokeless tobacco. He reports that he does not drink alcohol or use illicit drugs.  ALLERGIES:  Allergies  Allergen Reactions  . Naproxen Rash  . Clopidogrel Bisulfate Other (See Comments)    H/O diverticulitis; prone to rectal bleeding.  Plavix complicated this.  jkl  . Sulfonamide Derivatives Hives  . Other Other (See Comments)    Narcotics-C. Diff  . Tape Itching and Rash    Latex-Adhesive tape    ROS: A comprehensive review of systems was negative except for: Cardiovascular: positive for lower extremity edema Musculoskeletal: positive for back pain Neurological: positive for dizziness  HOME MEDS: Current Outpatient Prescriptions  Medication Sig Dispense Refill  . aspirin EC 81 MG tablet Take 81 mg by mouth daily.      . bisoprolol (ZEBETA) 5 MG tablet Take 0.5 tablets (2.5 mg total) by mouth daily.  45 tablet  2  . citalopram (CELEXA) 40 MG tablet Take 1 tablet (40 mg total) by mouth daily.  90 tablet  2  . doxazosin (CARDURA) 8 MG tablet Take 8 mg by mouth at bedtime.      . fexofenadine (ALLEGRA) 180 MG tablet Take 180 mg by mouth daily.       . fluticasone (FLONASE) 50 MCG/ACT nasal spray Place 2 sprays into the nose daily.       . furosemide (LASIX) 20 MG tablet Take 1 tablet (20 mg total) by mouth daily.  90 tablet  3  . gabapentin (NEURONTIN) 400 MG capsule Take 400 mg by mouth 3 (three) times daily.       Marland Kitchen HYDROcodone-acetaminophen (NORCO/VICODIN) 5-325 MG per tablet Take 1-2 tablets by mouth every 4 (four) hours as needed for pain. For pain      . LORazepam (ATIVAN) 0.5 MG tablet Take 0.25 mg by mouth at bedtime.       . nitroGLYCERIN (NITROSTAT) 0.4 MG SL tablet Place 0.4 mg under the tongue every 5 (five) minutes as needed for chest pain. x3 doses as needed for chest pain      .  simvastatin (ZOCOR) 40 MG tablet TAKE 1 TABLET BY MOUTH AT BEDTIME  90 tablet  3  . Tamsulosin HCl (FLOMAX) 0.4 MG CAPS Take 0.4 mg by mouth daily after supper.      . testosterone (ANDROGEL) 50 MG/5GM GEL Place 5 g onto the skin daily.       Marland Kitchen  traMADol-acetaminophen (ULTRACET) 37.5-325 MG per tablet Take 2 tablets by mouth 3 (three) times daily as needed.       No current facility-administered medications for this visit.    LABS/IMAGING: No results found for this or any previous visit (from the past 48 hour(s)). No results found.  VITALS: BP 148/72  Pulse 67  Ht 5' 8.5" (1.74 m)  Wt 201 lb 14.4 oz (91.581 kg)  BMI 30.25 kg/m2  EXAM: General appearance: alert and no distress Neck: no carotid bruit and no JVD Lungs: clear to auscultation bilaterally Heart: regular rate and rhythm, S1, S2 normal, no murmur, click, rub or gallop Abdomen: soft, non-tender; bowel sounds normal; no masses,  no organomegaly Extremities: extremities normal, atraumatic, no cyanosis or edema Pulses: 2+ and symmetric Skin: Skin color, texture, turgor normal. No rashes or lesions Neurologic: Mental status: Alert, oriented, thought content appropriate, strength 4/5 RLE, 5/5 LLE Psych: Mood, affect normal  EKG: Normal sinus rhythm at 67  ASSESSMENT: 1. Coronary artery disease status post occluded circumflex in 2011 with collaterals 2. Hypertension-controlled 3. Dyslipidemia-at goal 4. Legs/back pain 5. Positional dizziness at night  PLAN: 1.   Gary [name] is doing well from a cardiac standpoint. He was seen in the emergency room for atypical chest pain and ruled out for MI. He underwent surgery this year without complications. His cholesterol is controlled and blood pressure is okay. He does report some positional dizziness at night which may be related to his Cardura. I recommended he consider taking that in the morning. He should continue to work on exercise and weight loss. I understand he is going back  to see his orthopedist as he continues to have leg and back pain despite surgery.  Pixie Casino, MD, [facility] Attending Cardiologist [facility]  HILTY,Kenneth C 01/21/2013, 11:37 AM

## 2013-01-21 NOTE — Patient Instructions (Signed)
Your physician wants you to follow-up in: 1 year. You will receive a reminder letter in the mail two months in advance. If you don't receive a letter, please call our office to schedule the follow-up appointment.  

## 2013-03-17 ENCOUNTER — Other Ambulatory Visit: Payer: Self-pay | Admitting: *Deleted

## 2013-03-17 NOTE — Telephone Encounter (Signed)
Walk-In  Pt requesting a 90-day Rx for bisoprolol.  RN reviewed chart and refill sent on 11.24.15 for #45 w/ 2 refills.  Pt takes 1/2 tab daily.  Pt informed 90-day refill was sent and RN called pharmacy to confirm.  Informed Rx is there and they will fill now.  Pt informed and verbalized understanding.

## 2013-03-23 ENCOUNTER — Telehealth: Payer: Self-pay | Admitting: *Deleted

## 2013-03-23 NOTE — Telephone Encounter (Signed)
Returned call and pt verified x 2.  Pt informed message received and that [facility] usually faxes the MD the clearance form to be completed and it is faxed back to them.  Pt stated he has an appt on Monday w/ them and will ask for it to be sent over.  Pt agreed w/ plan.

## 2013-03-23 NOTE — Telephone Encounter (Signed)
Pt was calling in regards to having hip surgery and needs to know if we can fax over a clearance to Dr. Rhona Raider at [facility].  [place]

## 2013-03-24 ENCOUNTER — Other Ambulatory Visit: Payer: Self-pay | Admitting: Dermatology

## 2013-04-05 ENCOUNTER — Encounter: Payer: Self-pay | Admitting: Gastroenterology

## 2013-04-15 IMAGING — CR DG CHEST 2V
2 series · 2 of 2 positions shown · non-contrast
Comparison: 06/27/2010

CLINICAL DATA: Fever and upper abdominal pain

CHEST - 2 VIEW

[w chest pa]
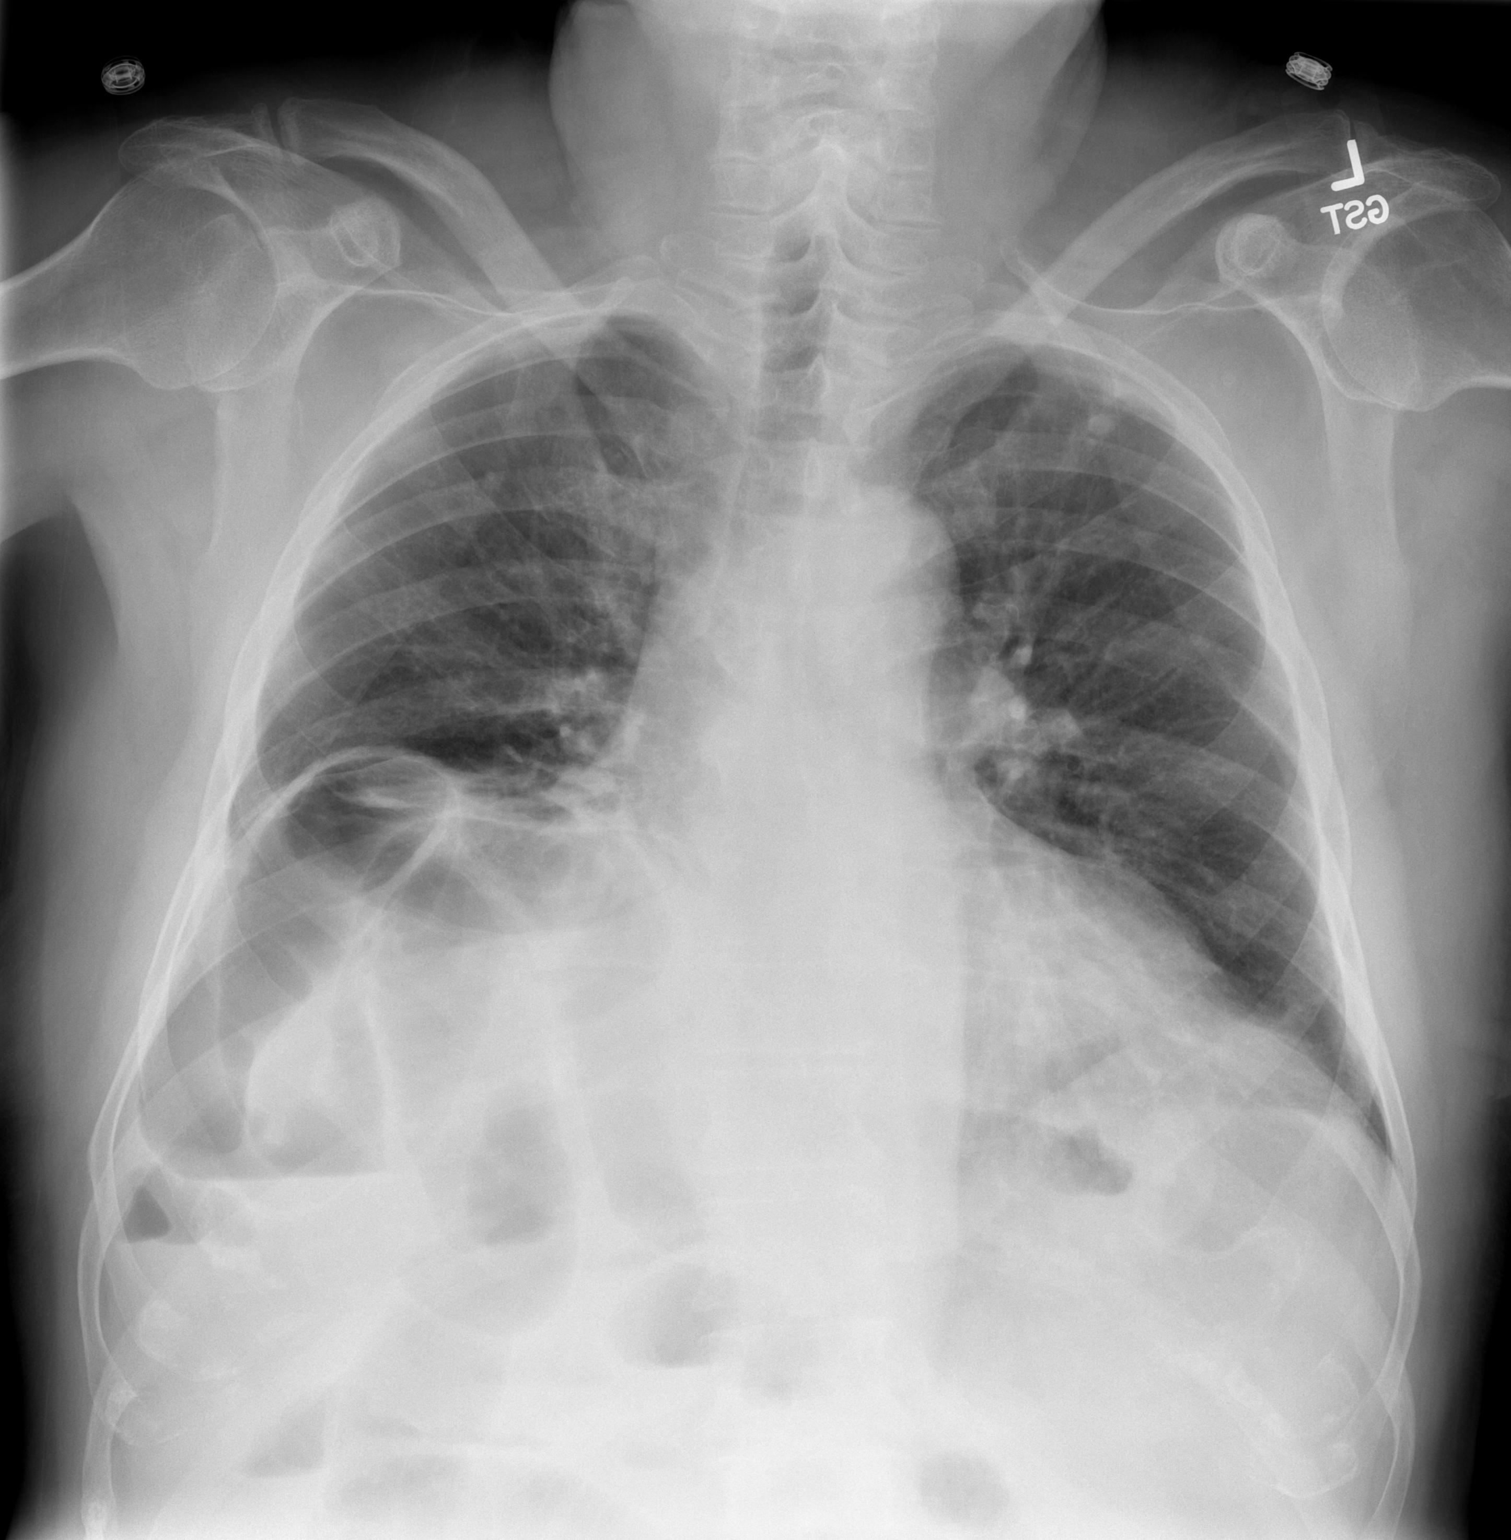

[w chest lat]
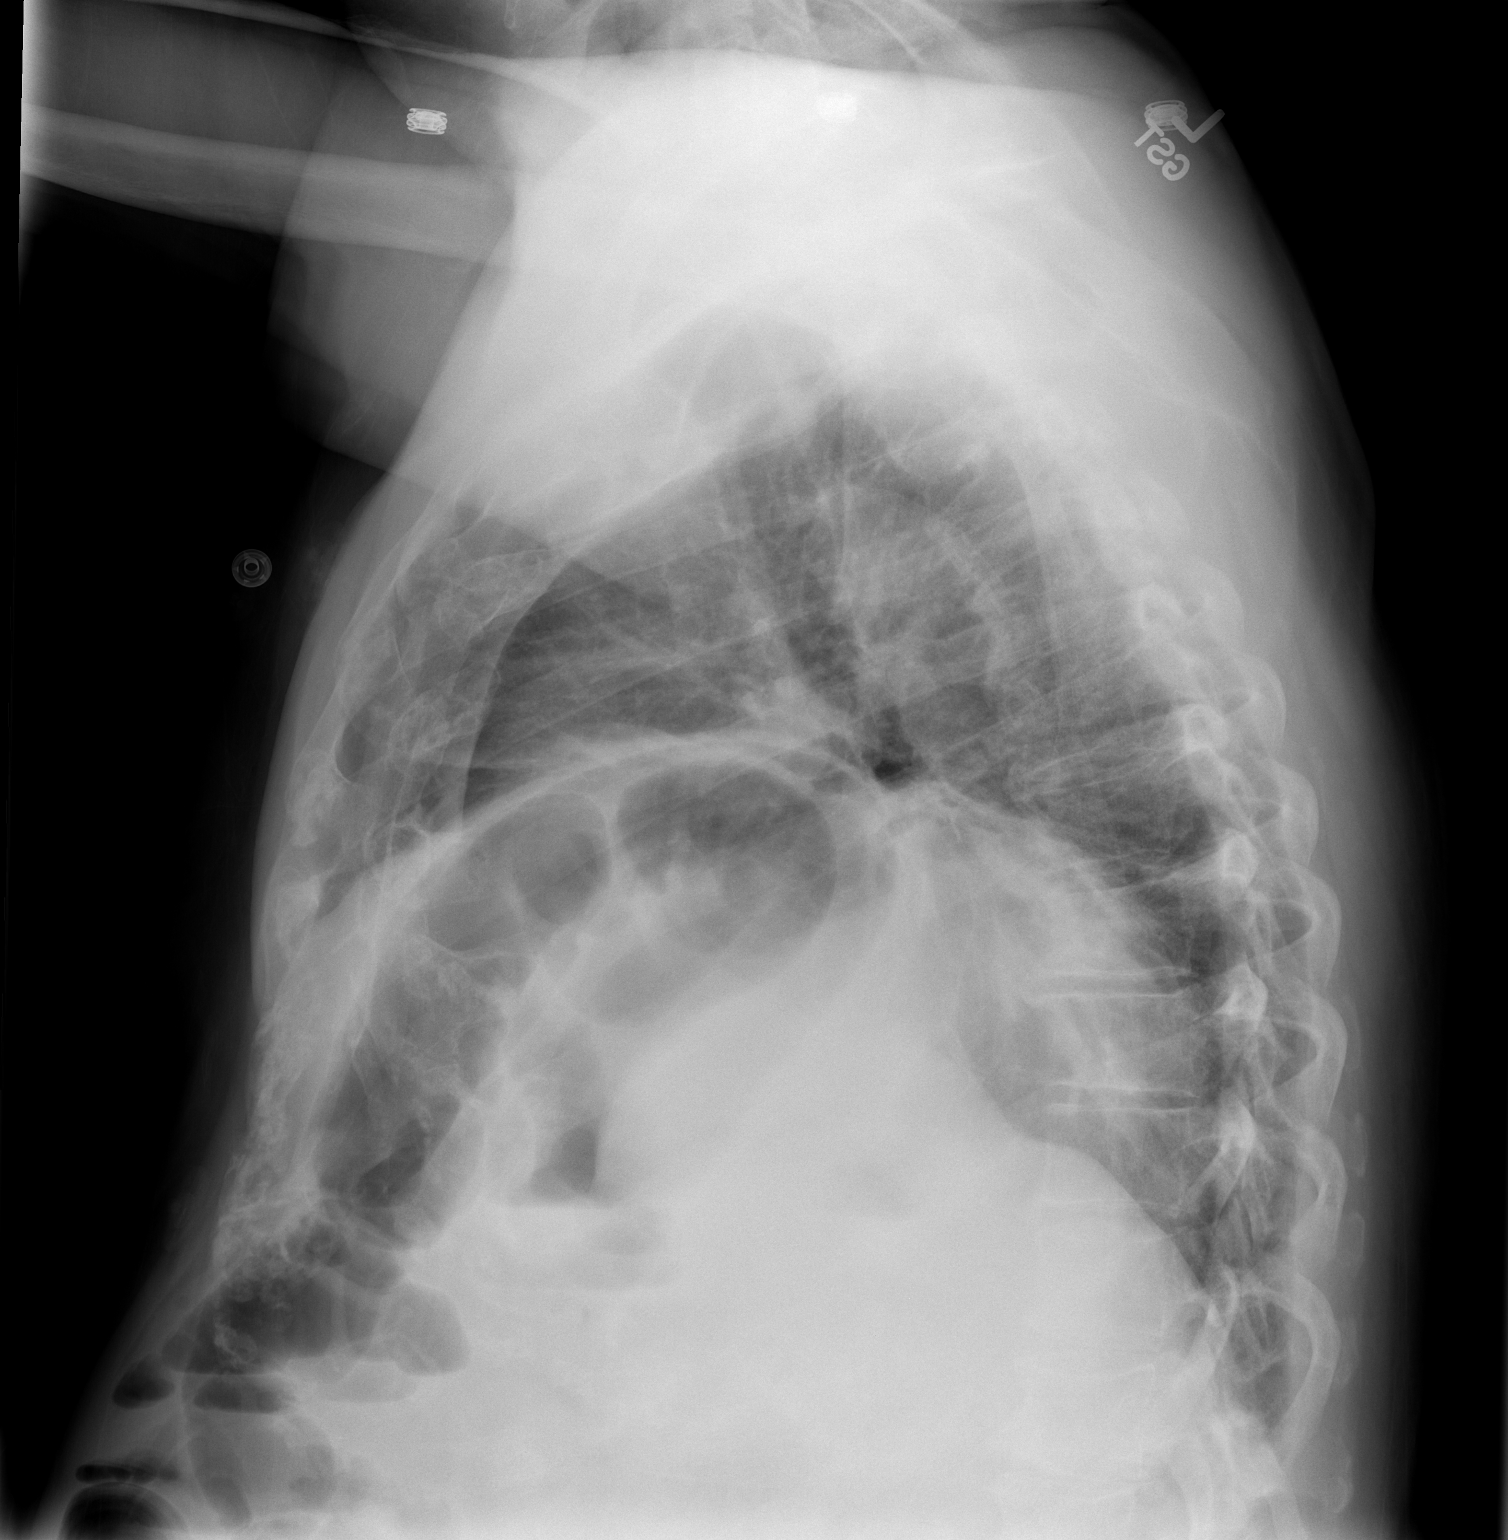

[2 of 2 positions shown; findings below may reference images not displayed]

FINDINGS: Shallow inspiration.  Infiltration or atelectasis in both
lung bases, greater on the right.  This is new since the previous
study may represent pneumonia.  Borderline heart size and pulmonary
vascularity.  Calcified granulomas in the upper lungs.  The upper
abdomen demonstrates bowel interposition in front of the liver and
under the right hemidiaphragm.  Suggestion of mild gaseous
distension of small bowel loops with air-fluid levels.  Small bowel
obstruction should be considered.  Abdominal films likely would be
useful in further evaluation.
IMPRESSION: Infiltration or atelectasis in both lung bases, greater on the
right, suggesting pneumonia.  Right upper quadrant suggest possible
small bowel dilatation and air-fluid levels.  Obstruction should be
excluded.

## 2013-05-12 ENCOUNTER — Ambulatory Visit (INDEPENDENT_AMBULATORY_CARE_PROVIDER_SITE_OTHER): Payer: Medicare Other | Admitting: Gastroenterology

## 2013-05-12 ENCOUNTER — Encounter: Payer: Self-pay | Admitting: Gastroenterology

## 2013-05-12 VITALS — BP 110/60 | HR 84 | Ht 65.0 in | Wt 208.0 lb

## 2013-05-12 DIAGNOSIS — Z860100 Personal history of colon polyps, unspecified: Secondary | ICD-10-CM

## 2013-05-12 DIAGNOSIS — R141 Gas pain: Secondary | ICD-10-CM

## 2013-05-12 DIAGNOSIS — K648 Other hemorrhoids: Secondary | ICD-10-CM

## 2013-05-12 DIAGNOSIS — R143 Flatulence: Secondary | ICD-10-CM

## 2013-05-12 DIAGNOSIS — R142 Eructation: Secondary | ICD-10-CM

## 2013-05-12 DIAGNOSIS — R140 Abdominal distension (gaseous): Secondary | ICD-10-CM

## 2013-05-12 NOTE — Patient Instructions (Signed)
Your first hemorrhoidal banding is scheduled on 06/25/2013 at 9:15am  Take FloraQ daily with antibiotics  You have been scheduled for a flexible sigmoidoscopy. Please follow the written instructions given to you at your visit today. If you use inhalers (even only as needed), please bring them with you on the day of your procedure.

## 2013-05-12 NOTE — Assessment & Plan Note (Signed)
He should has had recurrence of members colitis.  I recommended that he use flora q. concurrently if he has to take antibiotics

## 2013-05-12 NOTE — Assessment & Plan Note (Signed)
Plan followup sigmoidoscopy

## 2013-05-12 NOTE — Assessment & Plan Note (Signed)
Plan band ligation for grade 2 internal hemorrhoids

## 2013-05-12 NOTE — Assessment & Plan Note (Signed)
Excess flatus is probably reflective of mild digestion of certain foodstuffs.  This appears to be a minor problem.

## 2013-05-12 NOTE — Progress Notes (Signed)
_                                                                                                                History of Present Illness: Pleasant 80 year old white male with history of colon polyps, coronary artery disease, pseudomembranous colitis referred for evaluation of excess gas and occasional loose stools.  He complains of recurrent hemorrhoidal problems characterized by difficulty with hygiene.  He status post subtotal colectomy in 2011 for recurrent bleeding diverticulosis.  He's had 2 episodes of submembranous colitis.  He has noticed some bulging in his abdomen both in the left upper quadrant in the right groin.  He is without abdominal pain.  He has excessive gas frequently, especially at night.  In 2010 an adenomatous polyp was removed at colonoscopy.    Past Medical History  Diagnosis Date  . Hypertension   . Hemorrhoids   . Chronic kidney disease   . Kidney stones   . Dyslipidemia   . Diverticulosis   . Colon polyps   . Blood transfusion   . Anemia   . GI bleeding after 07/2009    "triggered by Plavix & ASA; from my diverticulosis"  . GERD (gastroesophageal reflux disease)   . Headache(784.0)     occasionally  . Anxiety   . Arthritis     "hips and lower back"  . Chronic back pain greater than 3 months duration   . Diarrhea   . Arthritis pain   . Cholecystitis   . Heart attack 07/2009    nonSTEMI with an occluded circumflex artery and collaterals.  . Coronary artery disease   . History of shingles   . Heart murmur   . Neuromuscular disorder   . Coronary angioplasty status 2011  . Stomach problems   . Lower extremity edema     Taking furosemide and it seems to be helping.  . SOB (shortness of breath)     occasional  . Nonspecific chest pain 08/26/2012    Went to ED 08/26/12  . Hyperlipidemia   . Chest pain 03/29/11    2D Echo without contrast on 03/29/11 - EF= 55-60%.  . H/O myocardial perfusion scan 04/04/11    For Pre-noncardiac surgery  - EF = 67%, no ischemia, and is considered low risk.  . H/O Clostridium difficile infection    Past Surgical History  Procedure Laterality Date  . Colon surgery      COLONOSCOPY  . Subtotal colectomy  01/24/2010  . Eye surgery  1942    "right eye was crossed; they  corrected it"  . Posterior fusion lumbar spine  08/1995    "bottom 4 vertebra"  . Back surgery  1997  . Cholecystectomy  04/12/2011    Procedure: CHOLECYSTECTOMY;  Surgeon: Adin Hector, MD;  Location: [place] ;  Service: General;  Laterality: N/A;  . Cholecystectomy  04/12/2011    Procedure: LAPAROSCOPIC CHOLECYSTECTOMY;  Surgeon: Adin Hector, MD;  Location: [place];  Service: General;  Laterality: N/A;  converted to  open  . Lumbar fusion  07/22/2012  . Cardiac catheterization  08/09/2009    Circumflex 100% occluded with right-to-left collaterals, mild irregularities in the proximal and distal LAD and diagonal system, normal LV systolic function, and minor infrarenal irregularities, but no abdominal aortic aneurysm.   family history includes Diverticulosis in his sister; Heart disease in his father; Pancreatic cancer in his mother; Stroke in his father. There is no history of Colon cancer. Current Outpatient Prescriptions  Medication Sig Dispense Refill  . aspirin EC 81 MG tablet Take 81 mg by mouth daily.      . bisoprolol (ZEBETA) 5 MG tablet Take 0.5 tablets (2.5 mg total) by mouth daily.  45 tablet  2  . citalopram (CELEXA) 40 MG tablet Take 1 tablet (40 mg total) by mouth daily.  90 tablet  2  . doxazosin (CARDURA) 8 MG tablet Take 8 mg by mouth at bedtime.      . fexofenadine (ALLEGRA) 180 MG tablet Take 180 mg by mouth daily.       . fluticasone (FLONASE) 50 MCG/ACT nasal spray Place 2 sprays into the nose daily.       . furosemide (LASIX) 20 MG tablet Take 1 tablet (20 mg total) by mouth daily.  90 tablet  3  . gabapentin (NEURONTIN) 400 MG capsule Take 400 mg by mouth 3 (three) times daily.       Marland Kitchen  HYDROcodone-acetaminophen (NORCO/VICODIN) 5-325 MG per tablet Take 1-2 tablets by mouth every 4 (four) hours as needed for pain. For pain      . LORazepam (ATIVAN) 0.5 MG tablet Take 0.25 mg by mouth at bedtime.       . nitroGLYCERIN (NITROSTAT) 0.4 MG SL tablet Place 0.4 mg under the tongue every 5 (five) minutes as needed for chest pain. x3 doses as needed for chest pain      . simvastatin (ZOCOR) 40 MG tablet TAKE 1 TABLET BY MOUTH AT BEDTIME  90 tablet  3  . Tamsulosin HCl (FLOMAX) 0.4 MG CAPS Take 0.4 mg by mouth daily after supper.      . testosterone (ANDROGEL) 50 MG/5GM GEL Place 5 g onto the skin daily.       . traMADol-acetaminophen (ULTRACET) 37.5-325 MG per tablet Take 2 tablets by mouth 3 (three) times daily as needed.       No current facility-administered medications for this visit.   Allergies as of 05/12/2013 - Review Complete 05/12/2013  Allergen Reaction Noted  . Naproxen Rash   . Clopidogrel bisulfate Other (See Comments)   . Sulfonamide derivatives Hives   . Other Other (See Comments) 08/26/2012  . Plavix [clopidogrel bisulfate]  05/12/2013  . Tape Itching and Rash 07/22/2012    reports that he has never smoked. He has never used smokeless tobacco. He reports that he does not drink alcohol or use illicit drugs.     Review of Systems: He has chronic hip pain and is anticipating a hip replacement Pertinent positive and negative review of systems were noted in the above HPI section. All other review of systems were otherwise negative.  Vital signs were reviewed in today's medical record Physical Exam: General: Well developed , well nourished, no acute distress Skin: anicteric Head: Normocephalic and atraumatic Eyes:  sclerae anicteric, EOMI Ears: Normal auditory acuity Mouth: No deformity or lesions Neck: Supple, no masses or thyromegaly Lungs: Clear throughout to auscultation Heart: Regular rate and rhythm; no  rubs or bruits.  There is a 1/6 early  systolic  murmur at the left sternal border Abdomen: Soft, non tender and non distended. No masses, hepatosplenomegaly or hernias noted. Normal Bowel sounds.  There are no obvious hernias Rectal: No external abnormalities Musculoskeletal: Symmetrical with no gross deformities  Skin: No lesions on visible extremities Pulses:  Normal pulses noted Extremities: No clubbing, cyanosis, edema or deformities noted Neurological: Alert oriented x 4, grossly nonfocal Cervical Nodes:  No significant cervical adenopathy Inguinal Nodes: No significant inguinal adenopathy Psychological:  Alert and cooperative. Normal mood and affect  See Assessment and Plan under Problem List

## 2013-05-17 ENCOUNTER — Encounter: Payer: Self-pay | Admitting: Gastroenterology

## 2013-05-20 ENCOUNTER — Other Ambulatory Visit: Payer: Self-pay | Admitting: Dermatology

## 2013-05-24 ENCOUNTER — Telehealth: Payer: Self-pay | Admitting: Gastroenterology

## 2013-05-24 NOTE — Telephone Encounter (Signed)
Patient states that he is in a lot of pain due to increased hip issues.  Patient states that his pain today is #9.   States that he will need to cancel his procedure for tomorrow and reschedule for after his hip replacement.  He asked about a charge, and I told him that there probably won't be a charge due to his circumstances.

## 2013-05-25 ENCOUNTER — Other Ambulatory Visit: Payer: Medicare Other | Admitting: Gastroenterology

## 2013-06-02 ENCOUNTER — Telehealth: Payer: Self-pay | Admitting: Gastroenterology

## 2013-06-02 NOTE — Telephone Encounter (Signed)
Rescheduled pt for tomorrow at 10:15

## 2013-06-03 ENCOUNTER — Encounter: Payer: Self-pay | Admitting: Gastroenterology

## 2013-06-03 ENCOUNTER — Ambulatory Visit (INDEPENDENT_AMBULATORY_CARE_PROVIDER_SITE_OTHER): Payer: Medicare Other | Admitting: Gastroenterology

## 2013-06-03 VITALS — BP 122/68 | HR 66 | Ht 66.0 in | Wt 208.0 lb

## 2013-06-03 DIAGNOSIS — K648 Other hemorrhoids: Secondary | ICD-10-CM

## 2013-06-03 NOTE — Progress Notes (Signed)
PROCEDURE NOTE: The patient presents with symptomatic grade *2**  hemorrhoids, requesting rubber band ligation of his/her hemorrhoidal disease.  All risks, benefits and alternative forms of therapy were described and informed consent was obtained.   The anorectum was pre-medicated with lubricant and nitroglycerine ointment The decision was made to band the *right anterior** internal hemorrhoid, and the [place] was used to perform band ligation without complication.  Digital anorectal examination was then performed to assure proper positioning of the band, and to adjust the banded tissue as required.  The patient was discharged home without pain or other issues.  Dietary and behavioral recommendations were given and along with follow-up instructions.    The patient will return in **3* for  follow-up and possible additional banding as required. No complications were encountered and the patient tolerated the procedure well.

## 2013-06-03 NOTE — Patient Instructions (Signed)
HEMORRHOID BANDING PROCEDURE    FOLLOW-UP CARE   1. The procedure you have had should have been relatively painless since the banding of the area involved does not have nerve endings and there is no pain sensation.  The rubber band cuts off the blood supply to the hemorrhoid and the band may fall off as soon as 48 hours after the banding (the band may occasionally be seen in the toilet bowl following a bowel movement). You may notice a temporary feeling of fullness in the rectum which should respond adequately to plain Tylenol or Motrin.  2. Following the banding, avoid strenuous exercise that evening and resume full activity the next day.  A sitz bath (soaking in a warm tub) or bidet is soothing, and can be useful for cleansing the area after bowel movements.     3. To avoid constipation, take two tablespoons of natural wheat bran, natural oat bran, flax, Benefiber or any over the counter fiber supplement and increase your water intake to 7-8 glasses daily.    4. Unless you have been prescribed anorectal medication, do not put anything inside your rectum for two weeks: No suppositories, enemas, fingers, etc.  5. Occasionally, you may have more bleeding than usual after the banding procedure.  This is often from the untreated hemorrhoids rather than the treated one.  Don't be concerned if there is a tablespoon or so of blood.  If there is more blood than this, lie flat with your bottom higher than your head and apply an ice pack to the area. If the bleeding does not stop within a half an hour or if you feel faint, call our office at [phone] or go to the emergency room.  6. Problems are not common; however, if there is a substantial amount of bleeding, severe pain, chills, fever or difficulty passing urine (very rare) or other problems, you should call us at [phone] or report to the nearest emergency room.  7. Do not stay seated continuously for more than 2-3 hours for a day or two  after the procedure.  Tighten your buttock muscles 10-15 times every two hours and take 10-15 deep breaths every 1-2 hours.  Do not spend more than a few minutes on the toilet if you cannot empty your bowel; instead re-visit the toilet at a later time.   YOUR 2ND BANDING IS SCHEDULED ON 06/25/2013 AT 9:15AM

## 2013-06-11 ENCOUNTER — Telehealth: Payer: Self-pay | Admitting: *Deleted

## 2013-06-11 DIAGNOSIS — Z01810 Encounter for preprocedural cardiovascular examination: Secondary | ICD-10-CM

## 2013-06-11 DIAGNOSIS — I2510 Atherosclerotic heart disease of native coronary artery without angina pectoris: Secondary | ICD-10-CM

## 2013-06-11 NOTE — Telephone Encounter (Signed)
Patient was notified that he will need a stress test prior to Dr. Debara Pickett being able to clear him for hip surgery. Lexiscan Myoview ordered and scheduler notified.

## 2013-06-14 ENCOUNTER — Telehealth (HOSPITAL_COMMUNITY): Payer: Self-pay | Admitting: *Deleted

## 2013-06-18 ENCOUNTER — Telehealth (HOSPITAL_COMMUNITY): Payer: Self-pay

## 2013-06-19 IMAGING — CR DG CHEST 1V PORT
1 series · 1 of 1 positions shown · non-contrast
Comparison: Portable exam 4279 hours compared to 01/29/2011

CLINICAL DATA: Substernal chest pain, history MI, hypertension

PORTABLE CHEST - 1 VIEW

[view not recorded]
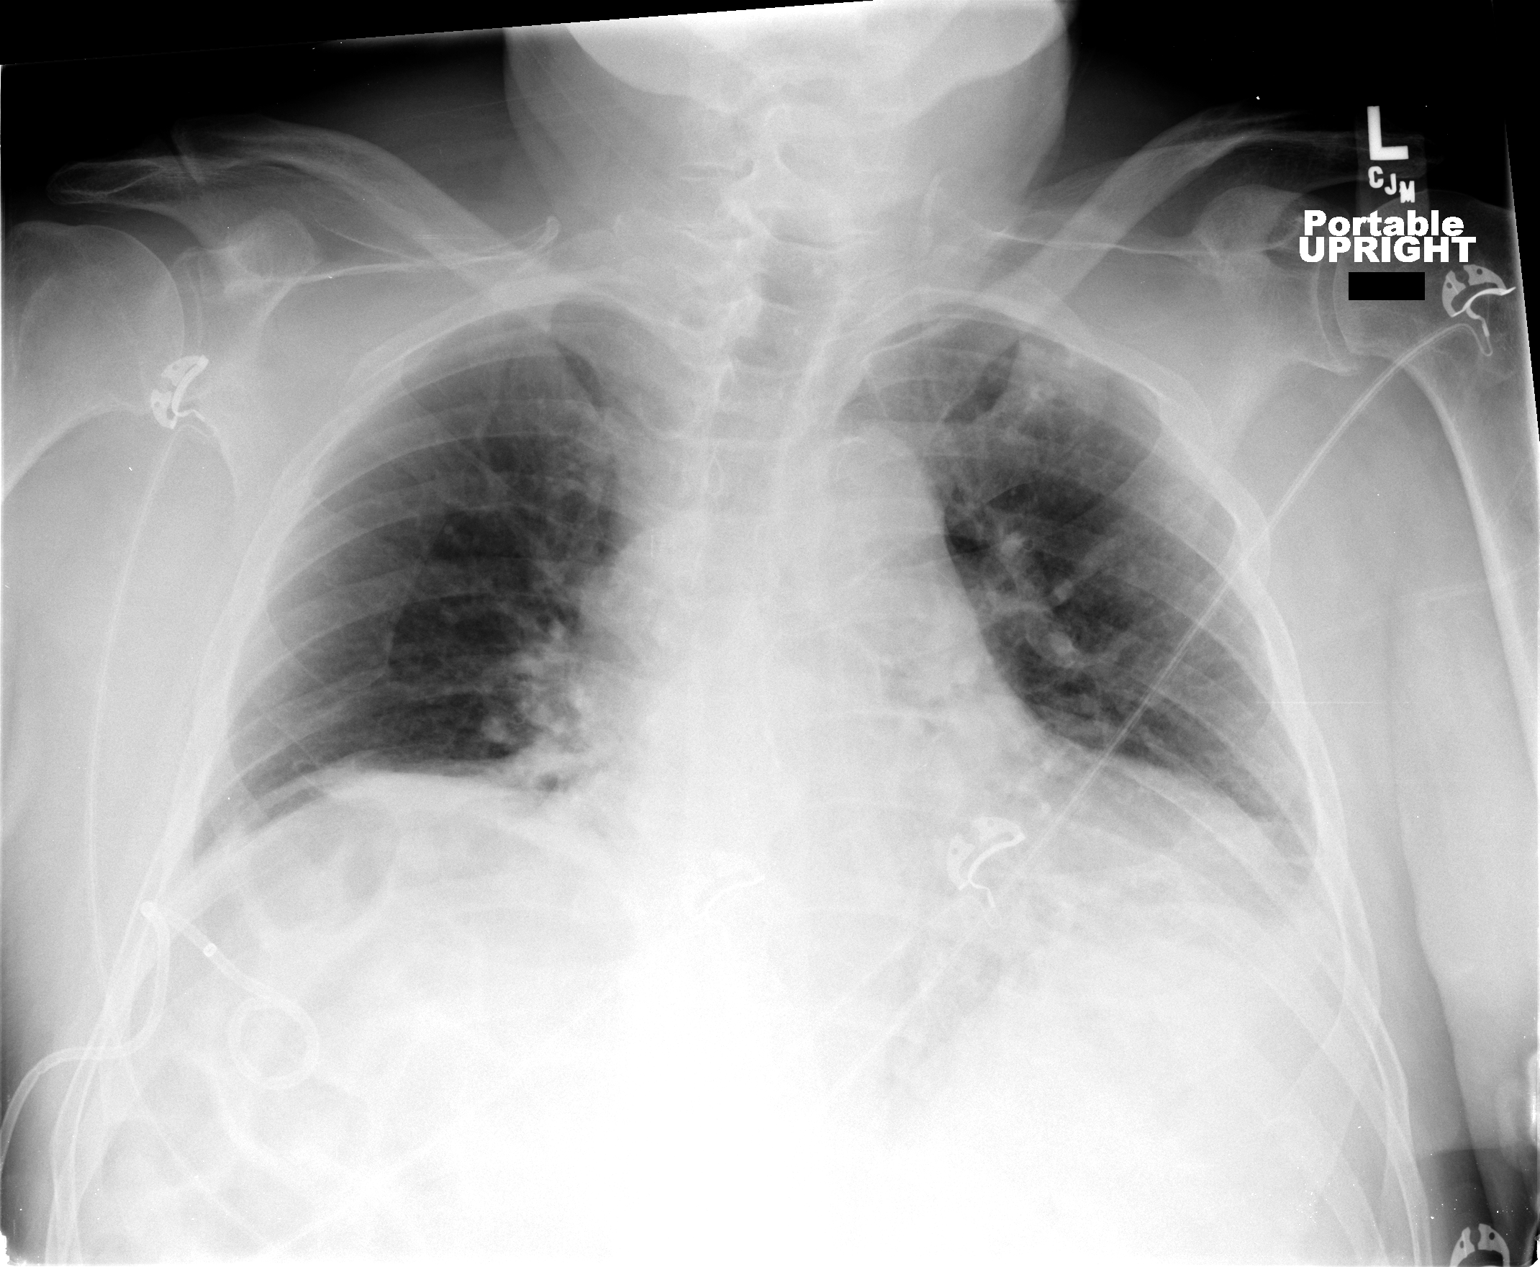

[1 of 1 positions shown; findings below may reference images not displayed]

FINDINGS: Low lung volumes.
Enlargement of cardiac silhouette.
Mediastinal contours and pulmonary vascularity normal.
Bibasilar atelectasis.
Lungs otherwise clear.
No pleural effusion or pneumothorax.
Bowel interposition between liver and diaphragm.
Pigtail drainage catheter right upper quadrant, by prior CT a
percutaneous cholecystostomy drain.
IMPRESSION: Low lung volumes with bibasilar atelectasis.
Enlargement of cardiac silhouette.

## 2013-06-23 ENCOUNTER — Ambulatory Visit (HOSPITAL_COMMUNITY)
Admission: RE | Admit: 2013-06-23 | Discharge: 2013-06-23 | Disposition: A | Discharge status: Home / Self Care | Payer: Medicare Other | Source: Ambulatory Visit | Attending: Cardiovascular Disease | Admitting: Cardiovascular Disease

## 2013-06-23 DIAGNOSIS — Z01810 Encounter for preprocedural cardiovascular examination: Secondary | ICD-10-CM

## 2013-06-23 DIAGNOSIS — I2510 Atherosclerotic heart disease of native coronary artery without angina pectoris: Secondary | ICD-10-CM | POA: Insufficient documentation

## 2013-06-23 MED ORDER — REGADENOSON 0.4 MG/5ML IV SOLN
0.4000 mg | Freq: Once | INTRAVENOUS | Status: AC
  Administered 2013-06-23: 0.4 mg via INTRAVENOUS

## 2013-06-23 MED ORDER — TECHNETIUM TC 99M SESTAMIBI GENERIC - CARDIOLITE
30.0000 | Freq: Once | INTRAVENOUS | Status: AC | PRN
  Administered 2013-06-23: 30 via INTRAVENOUS

## 2013-06-23 MED ORDER — TECHNETIUM TC 99M SESTAMIBI GENERIC - CARDIOLITE
10.0000 | Freq: Once | INTRAVENOUS | Status: AC | PRN
  Administered 2013-06-23: 10 via INTRAVENOUS

## 2013-06-23 NOTE — Procedures (Addendum)
[place] [address] [place] [address] [id number]  Cardiology Nuclear Med Study  Gary [name] is a 80 y.o. male     MRN : [id number]     DOB: [birth year]  Procedure Date: 06/23/2013  Nuclear Med Background Indication for Stress Test:  Surgical Clearance History:  CAD;MI-07/2009;cardiac murmur;Last NUC MPI on 04/04/2011-nonischemic;EF=67% Cardiac Risk Factors: Family History - CAD, Hypertension, Lipids and Obesity  Symptoms:  Dizziness, Palpitations and SOB   Nuclear Pre-Procedure Caffeine/Decaff Intake:  7:00pm NPO After: 5:00am   IV Site: R Forearm  IV 0.9% NS with Angio Cath:  22g  Chest Size (in):  42"  IV Started by: Rolene Course, RN  Height: 5\' 9"  (1.753 m)  Cup Size: n/a  BMI:  Body mass index is 31.15 kg/(m^2). Weight:  211 lb (95.709 kg)   Tech Comments:  n/a    Nuclear Med Study 1 or 2 day study: 1 day  Stress Test Type:  [place] Provider:  Lyman Bishop, MD   Resting Radionuclide: Technetium 59m Sestamibi  Resting Radionuclide Dose: 10.4 mCi   Stress Radionuclide:  Technetium 95m Sestamibi  Stress Radionuclide Dose: 30.8 mCi           Stress Protocol Rest HR:57 Stress HR: 73  Rest BP: 148/80 Stress BP: 148/80  Exercise Time (min): n/a METS: n/a          Dose of Adenosine (mg):  n/a Dose of Lexiscan: 0.4 mg  Dose of Atropine (mg): n/a Dose of Dobutamine: n/a mcg/kg/min (at max HR)  Stress Test Technologist: Mellody Memos, CCT Nuclear Technologist: Otho Perl, CNMT   Rest Procedure:  Myocardial perfusion imaging was performed at rest 45 minutes following the intravenous administration of Technetium 98m Sestamibi. Stress Procedure:  The patient received IV Lexiscan 0.4 mg over 15-seconds.  Technetium 89m Sestamibi injected IV at 30-seconds.  There were no significant changes with Lexiscan.  Quantitative spect images were obtained after a 45 minute delay.  Transient  Ischemic Dilatation (Normal <1.22):  1.04 Lung/Heart Ratio (Normal <0.45):  0.42 QGS EDV:  93 ml QGS ESV:  44 ml LV Ejection Fraction: 52%  .    Rest ECG: NSR - Normal EKG  Stress ECG: No significant change from baseline ECG  QPS Raw Data Images:  Normal; no motion artifact; normal heart/lung ratio. Stress Images:  There is decreased uptake in the lateral wall. Rest Images:  Comparison with the stress images reveals mild improvement at the periphery of the lateral wall defect. Subtraction (SDS):  These findings are consistent with infarction and mild periinfarct ischemia. LV Wall Motion:  Mildly reduce overall LV systolic function and mild lateral wall hypokinesis.  Impression Exercise Capacity:  Lexiscan with no exercise. BP Response:  Normal blood pressure response. Clinical Symptoms:  No significant symptoms noted. ECG Impression:  No significant ST segment change suggestive of ischemia. Comparison with Prior Nuclear Study: Direct comparison with the study from 2010 shows a similar distribution of the perfusion defect, but there appears to be a mostly fixed defect on the current study, more reversibility on the previous one.   Overall Impression:  Low risk stress nuclear study with a mild to moderate lateral wall infarction and mild periinfarct ischemia. 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

## 2013-06-24 ENCOUNTER — Telehealth: Payer: Self-pay | Admitting: *Deleted

## 2013-06-24 ENCOUNTER — Encounter: Payer: Self-pay | Admitting: *Deleted

## 2013-06-24 NOTE — Telephone Encounter (Signed)
Faxed clearance (low to intermediate risk) to Dr. Melrose Nakayama at [facility]

## 2013-06-25 ENCOUNTER — Encounter: Payer: Self-pay | Admitting: Gastroenterology

## 2013-06-25 ENCOUNTER — Encounter: Payer: Medicare Other | Admitting: Gastroenterology

## 2013-06-25 ENCOUNTER — Ambulatory Visit (INDEPENDENT_AMBULATORY_CARE_PROVIDER_SITE_OTHER): Payer: Medicare Other | Admitting: Gastroenterology

## 2013-06-25 VITALS — BP 110/60 | HR 80 | Ht 65.0 in | Wt 207.1 lb

## 2013-06-25 DIAGNOSIS — K648 Other hemorrhoids: Secondary | ICD-10-CM

## 2013-06-25 NOTE — Progress Notes (Signed)
PROCEDURE NOTE: The patient presents with symptomatic grade *2**  hemorrhoids, requesting rubber band ligation of his/her hemorrhoidal disease.  All risks, benefits and alternative forms of therapy were described and informed consent was obtained.   The anorectum was pre-medicated with lubricant and nitroglycerine ointment The decision was made to band the *right posterior** internal hemorrhoid, and the [place] was used to perform band ligation without complication.  Digital anorectal examination was then performed to assure proper positioning of the band, and to adjust the banded tissue as required.  The patient was discharged home without pain or other issues.  Dietary and behavioral recommendations were given and along with follow-up instructions.    The patient will return in **2* for  follow-up and possible additional banding as required. No complications were encountered and the patient tolerated the procedure well.

## 2013-06-25 NOTE — Patient Instructions (Signed)
HEMORRHOID BANDING PROCEDURE    FOLLOW-UP CARE   1. The procedure you have had should have been relatively painless since the banding of the area involved does not have nerve endings and there is no pain sensation.  The rubber band cuts off the blood supply to the hemorrhoid and the band may fall off as soon as 48 hours after the banding (the band may occasionally be seen in the toilet bowl following a bowel movement). You may notice a temporary feeling of fullness in the rectum which should respond adequately to plain Tylenol or Motrin.  2. Following the banding, avoid strenuous exercise that evening and resume full activity the next day.  A sitz bath (soaking in a warm tub) or bidet is soothing, and can be useful for cleansing the area after bowel movements.     3. To avoid constipation, take two tablespoons of natural wheat bran, natural oat bran, flax, Benefiber or any over the counter fiber supplement and increase your water intake to 7-8 glasses daily.    4. Unless you have been prescribed anorectal medication, do not put anything inside your rectum for two weeks: No suppositories, enemas, fingers, etc.  5. Occasionally, you may have more bleeding than usual after the banding procedure.  This is often from the untreated hemorrhoids rather than the treated one.  Don't be concerned if there is a tablespoon or so of blood.  If there is more blood than this, lie flat with your bottom higher than your head and apply an ice pack to the area. If the bleeding does not stop within a half an hour or if you feel faint, call our office at [phone] or go to the emergency room.  6. Problems are not common; however, if there is a substantial amount of bleeding, severe pain, chills, fever or difficulty passing urine (very rare) or other problems, you should call us at [phone] or report to the nearest emergency room.  7. Do not stay seated continuously for more than 2-3 hours for a day or two  after the procedure.  Tighten your buttock muscles 10-15 times every two hours and take 10-15 deep breaths every 1-2 hours.  Do not spend more than a few minutes on the toilet if you cannot empty your bowel; instead re-visit the toilet at a later time.   Your 3rd banding is scheduled on 08/30/2013 at 8:45am We will call you if a sooner appointment becomes available

## 2013-06-28 ENCOUNTER — Telehealth: Payer: Self-pay | Admitting: Gastroenterology

## 2013-06-29 ENCOUNTER — Telehealth: Payer: Self-pay | Admitting: Internal Medicine

## 2013-06-29 NOTE — Telephone Encounter (Signed)
Dr Deatra Ina, Patient has an upcoming appointment for his hip surgery, needed his last banding ASAP I put him on for in the morning you had a cancellation

## 2013-06-29 NOTE — Telephone Encounter (Signed)
Patient called and stated that he has had some worsening depression and wants to know if Dr. Debara Pickett can increase is citalopram  (Celexa) 40 mg dailly to 60 mg daily.

## 2013-06-29 NOTE — Telephone Encounter (Signed)
Max dose is 40 mg/day. May need to speak with PCP about trying a different type of anti-depressant.  Dr. Debara Pickett

## 2013-06-29 NOTE — Telephone Encounter (Signed)
Will send to Dr. Debara Pickett for authorization.

## 2013-06-30 ENCOUNTER — Ambulatory Visit (INDEPENDENT_AMBULATORY_CARE_PROVIDER_SITE_OTHER): Payer: Medicare Other | Admitting: Gastroenterology

## 2013-06-30 ENCOUNTER — Encounter: Payer: Self-pay | Admitting: Gastroenterology

## 2013-06-30 VITALS — BP 138/70 | HR 68 | Ht 68.5 in | Wt 206.2 lb

## 2013-06-30 DIAGNOSIS — K648 Other hemorrhoids: Secondary | ICD-10-CM

## 2013-06-30 NOTE — Telephone Encounter (Signed)
LM with wife to have patient call back this afternoon or tomorrow morning when he is available.

## 2013-06-30 NOTE — Telephone Encounter (Signed)
RN spoke to patient. Information, given to patient of what Dr Debara Pickett had stated. RN informed patient that Dr Debara Pickett fills cardiac related medication. Patient stated he an upcoming appointment with PCP-Dr Nyoka Cowden on Friday Patient verbalized understanding

## 2013-06-30 NOTE — Patient Instructions (Signed)
HEMORRHOID BANDING PROCEDURE    FOLLOW-UP CARE   1. The procedure you have had should have been relatively painless since the banding of the area involved does not have nerve endings and there is no pain sensation.  The rubber band cuts off the blood supply to the hemorrhoid and the band may fall off as soon as 48 hours after the banding (the band may occasionally be seen in the toilet bowl following a bowel movement). You may notice a temporary feeling of fullness in the rectum which should respond adequately to plain Tylenol or Motrin.  2. Following the banding, avoid strenuous exercise that evening and resume full activity the next day.  A sitz bath (soaking in a warm tub) or bidet is soothing, and can be useful for cleansing the area after bowel movements.     3. To avoid constipation, take two tablespoons of natural wheat bran, natural oat bran, flax, Benefiber or any over the counter fiber supplement and increase your water intake to 7-8 glasses daily.    4. Unless you have been prescribed anorectal medication, do not put anything inside your rectum for two weeks: No suppositories, enemas, fingers, etc.  5. Occasionally, you may have more bleeding than usual after the banding procedure.  This is often from the untreated hemorrhoids rather than the treated one.  Don't be concerned if there is a tablespoon or so of blood.  If there is more blood than this, lie flat with your bottom higher than your head and apply an ice pack to the area. If the bleeding does not stop within a half an hour or if you feel faint, call our office at [phone] or go to the emergency room.  6. Problems are not common; however, if there is a substantial amount of bleeding, severe pain, chills, fever or difficulty passing urine (very rare) or other problems, you should call us at [phone] or report to the nearest emergency room.  7. Do not stay seated continuously for more than 2-3 hours for a day or two  after the procedure.  Tighten your buttock muscles 10-15 times every two hours and take 10-15 deep breaths every 1-2 hours.  Do not spend more than a few minutes on the toilet if you cannot empty your bowel; instead re-visit the toilet at a later time.  Call us after your hip surgery to schedule a follow up with Dr Deatra Ina

## 2013-06-30 NOTE — Assessment & Plan Note (Signed)
Patient requested to move up the appointment for his third banding because of anticipated hip replacement.

## 2013-06-30 NOTE — Progress Notes (Signed)
PROCEDURE NOTE: The patient presents with symptomatic grade *2**  hemorrhoids, requesting rubber band ligation of his/her hemorrhoidal disease.  All risks, benefits and alternative forms of therapy were described and informed consent was obtained.   The anorectum was pre-medicated with lubricant and nitroglycerine ointment The decision was made to band the **left lateral* internal hemorrhoid, and the [place] was used to perform band ligation without complication.  Digital anorectal examination was then performed to assure proper positioning of the band, and to adjust the banded tissue as required.  The patient was discharged home without pain or other issues.  Dietary and behavioral recommendations were given and along with follow-up instructions.    The patient will return in *4** for  follow-up and possible additional banding as required. No complications were encountered and the patient tolerated the procedure well.

## 2013-06-30 NOTE — Telephone Encounter (Signed)
Routed to triage pool should patient return call

## 2013-06-30 NOTE — Telephone Encounter (Signed)
Returning your call. °

## 2013-07-07 ENCOUNTER — Other Ambulatory Visit: Payer: Self-pay | Admitting: Orthopaedic Surgery

## 2013-07-22 ENCOUNTER — Encounter (HOSPITAL_COMMUNITY): Payer: Self-pay | Admitting: Pharmacy Technician

## 2013-07-26 ENCOUNTER — Encounter (HOSPITAL_COMMUNITY)
Admission: RE | Admit: 2013-07-26 | Discharge: 2013-07-26 | Disposition: A | Discharge status: Home / Self Care | Payer: Medicare Other | Source: Ambulatory Visit | Attending: Orthopaedic Surgery | Admitting: Orthopaedic Surgery

## 2013-07-26 ENCOUNTER — Encounter (HOSPITAL_COMMUNITY): Payer: Self-pay

## 2013-07-26 DIAGNOSIS — Z01812 Encounter for preprocedural laboratory examination: Secondary | ICD-10-CM | POA: Insufficient documentation

## 2013-07-26 HISTORY — DX: Adverse effect of unspecified anesthetic, initial encounter: T41.45XA

## 2013-07-26 HISTORY — DX: Other complications of anesthesia, initial encounter: T88.59XA

## 2013-07-26 LAB — CBC WITH DIFFERENTIAL/PLATELET
BASOS ABS: 0 10*3/uL (ref 0.0–0.1)
Basophils Relative: 0 % (ref 0–1)
Eosinophils Absolute: 0.2 10*3/uL (ref 0.0–0.7)
Eosinophils Relative: 2 % (ref 0–5)
HCT: 45.3 % (ref 39.0–52.0)
Hemoglobin: 15.1 g/dL (ref 13.0–17.0)
LYMPHS ABS: 1 10*3/uL (ref 0.7–4.0)
Lymphocytes Relative: 14 % (ref 12–46)
MCH: 30.3 pg (ref 26.0–34.0)
MCHC: 33.3 g/dL (ref 30.0–36.0)
MCV: 91 fL (ref 78.0–100.0)
Monocytes Absolute: 0.4 10*3/uL (ref 0.1–1.0)
Monocytes Relative: 5 % (ref 3–12)
NEUTROS ABS: 5.7 10*3/uL (ref 1.7–7.7)
NEUTROS PCT: 79 % — AB (ref 43–77)
PLATELETS: 143 10*3/uL — AB (ref 150–400)
RBC: 4.98 MIL/uL (ref 4.22–5.81)
RDW: 13.2 % (ref 11.5–15.5)
WBC: 7.2 10*3/uL (ref 4.0–10.5)

## 2013-07-26 LAB — URINALYSIS, ROUTINE W REFLEX MICROSCOPIC
Bilirubin Urine: NEGATIVE
Glucose, UA: NEGATIVE mg/dL
Hgb urine dipstick: NEGATIVE
KETONES UR: 15 mg/dL — AB
NITRITE: NEGATIVE
Protein, ur: 30 mg/dL — AB
Specific Gravity, Urine: 1.028 (ref 1.005–1.030)
UROBILINOGEN UA: 1 mg/dL (ref 0.0–1.0)
pH: 6 (ref 5.0–8.0)

## 2013-07-26 LAB — URINE MICROSCOPIC-ADD ON

## 2013-07-26 LAB — BASIC METABOLIC PANEL
BUN: 19 mg/dL (ref 6–23)
CO2: 29 mEq/L (ref 19–32)
Calcium: 9.6 mg/dL (ref 8.4–10.5)
Chloride: 101 mEq/L (ref 96–112)
Creatinine, Ser: 1.45 mg/dL — ABNORMAL HIGH (ref 0.50–1.35)
GFR calc non Af Amer: 44 mL/min — ABNORMAL LOW (ref >90)
GFR, EST AFRICAN AMERICAN: 51 mL/min — AB (ref >90)
Glucose, Bld: 141 mg/dL — ABNORMAL HIGH (ref 70–99)
Potassium: 3.9 mEq/L (ref 3.7–5.3)
Sodium: 141 mEq/L (ref 137–147)

## 2013-07-26 LAB — PROTIME-INR
INR: 1.01 (ref 0.00–1.49)
Prothrombin Time: 13.1 seconds (ref 11.6–15.2)

## 2013-07-26 LAB — TYPE AND SCREEN
ABO/RH(D): A POS
ANTIBODY SCREEN: NEGATIVE

## 2013-07-26 LAB — SURGICAL PCR SCREEN
MRSA, PCR: NEGATIVE
STAPHYLOCOCCUS AUREUS: POSITIVE — AB

## 2013-07-26 LAB — APTT: APTT: 26 s (ref 24–37)

## 2013-07-26 NOTE — Progress Notes (Signed)
I called a prescription to [facility], [place] , [place]; for Mupirocin ointment.

## 2013-07-26 NOTE — Pre-Procedure Instructions (Signed)
DAMEL CERTO  07/26/2013   Your procedure is scheduled on:  08-03-2013   Tuesday   Report to [facility] Admitting at 8:20 AM.   Call this number if you have problems the morning of surgery: [phone]   Remember:   Do not eat food or drink liquids after midnight.   Take these medicines the morning of surgery with A SIP OF WATER: bisprolol(Zebeta),citalopram(Celexa),fexofenadine(Allegra),Flonase nasal spray,gabapentin(Neurontin),pain medication as needed,   Do not wear jewelry  Do not wear lotions, powders, or perfumes. .  Do not shave 48 hours prior to surgery. Men may shave face and neck.  Do not bring valuables to the hospital.  [facility] is not responsible for any belongings or valuables.               Contacts, dentures or bridgework may not be worn into surgery.   Leave suitcase in the car. After surgery it may be brought to your room.  For patients admitted to the hospital, discharge time is determined by your treatment team.                   Special Instructions: See attached sheet for instructions on CHG shower/bath   Please read over the following fact sheets that you were given: Pain Booklet, Coughing and Deep Breathing, Blood Transfusion Information and Surgical Site Infection Prevention

## 2013-07-26 NOTE — Progress Notes (Signed)
Requested copy of Cardiac Clearance from [facility].

## 2013-07-30 NOTE — H&P (Signed)
TOTAL HIP ADMISSION H&P  Patient is admitted for right total hip arthroplasty.  Subjective:  Chief Complaint: right hip pain  HPI: Gary [name], 80 y.o. male, has a history of pain and functional disability in the right hip(s) due to arthritis and patient has failed non-surgical conservative treatments for greater than 12 weeks to include NSAID's and/or analgesics, corticosteriod injections, flexibility and strengthening excercises, weight reduction as appropriate and activity modification.  Onset of symptoms was gradual starting 5 years ago with gradually worsening course since that time.The patient noted no past surgery on the right hip(s).  Patient currently rates pain in the right hip at 10 out of 10 with activity. Patient has night pain, worsening of pain with activity and weight bearing and pain that interfers with activities of daily living. Patient has evidence of subchondral cysts, subchondral sclerosis, periarticular osteophytes and joint space narrowing by imaging studies. This condition presents safety issues increasing the risk of falls. There is no current active infection.  Patient Active Problem List   Diagnosis Date Noted  . Internal hemorrhoids with other complication [id number]  . C. difficile colitis 07/26/2012  . Ileus, postoperative 07/24/2012  . Somnolence 07/24/2012  . Anxiety state, unspecified 07/24/2012  . Chest pain- ER 08/26/12 03/30/2011  . Fever 03/28/2011  . Leucocytosis 03/28/2011  . CKD (chronic kidney disease), stage III 03/28/2011  . Hyponatremia 02/02/2011  . BPH (benign prostatic hyperplasia) 02/01/2011  . Cholecystitis chronic, acute 01/26/2011  . Dyslipidemia 01/22/2011  . Abdominal bloating 08/07/2010  . Loose stools 08/07/2010  . CAD, CFX MI Rx'd medically 6/11 10/26/2009  . HYPERTENSION 06/07/2009  . DIVERTICULOSIS OF COLON 10/31/2008  . DIVERTICULOSIS, COLON, WITH HEMORRHAGE, surg 12/11  10/31/2008  . Personal history of colonic polyps  10/31/2008   Past Medical History  Diagnosis Date  . Hypertension   . Hemorrhoids   . Chronic kidney disease   . Kidney stones   . Dyslipidemia   . Diverticulosis   . Colon polyps   . Blood transfusion   . Anemia   . GI bleeding after 07/2009    "triggered by Plavix & ASA; from my diverticulosis"  . GERD (gastroesophageal reflux disease)   . Anxiety   . Arthritis     "hips and lower back"  . Chronic back pain greater than 3 months duration   . Diarrhea   . Arthritis pain   . Cholecystitis   . Heart attack 07/2009    nonSTEMI with an occluded circumflex artery and collaterals.  . Coronary artery disease   . History of shingles   . Heart murmur   . Neuromuscular disorder   . Coronary angioplasty status 2011  . Stomach problems   . Lower extremity edema     Taking furosemide and it seems to be helping.  . Nonspecific chest pain 08/26/2012    Went to ED 08/26/12  . Hyperlipidemia   . Chest pain 03/29/11    2D Echo without contrast on 03/29/11 - EF= 55-60%.  . H/O myocardial perfusion scan 04/04/11    For Pre-noncardiac surgery - EF = 67%, no ischemia, and is considered low risk.  . H/O Clostridium difficile infection   . Complication of anesthesia     once slow to wake up    Past Surgical History  Procedure Laterality Date  . Colon surgery      COLONOSCOPY  . Subtotal colectomy  01/24/2010  . Eye surgery  1942    "right eye was crossed; they  corrected it"  . Posterior fusion lumbar spine  08/1995    "bottom 4 vertebra"  . Back surgery  1997  . Cholecystectomy  04/12/2011    Procedure: CHOLECYSTECTOMY;  Surgeon: Adin Hector, MD;  Location: [place];  Service: General;  Laterality: N/A;  . Cholecystectomy  04/12/2011    Procedure: LAPAROSCOPIC CHOLECYSTECTOMY;  Surgeon: Adin Hector, MD;  Location: [place];  Service: General;  Laterality: N/A;  converted to open  . Lumbar fusion  07/22/2012  . Cardiac catheterization  08/09/2009    Circumflex 100% occluded with  right-to-left collaterals, mild irregularities in the proximal and distal LAD and diagonal system, normal LV systolic function, and minor infrarenal irregularities, but no abdominal aortic aneurysm.    No prescriptions prior to admission   Allergies  Allergen Reactions  . Naproxen Rash  . Clopidogrel Bisulfate Other (See Comments)    H/O diverticulitis; prone to rectal bleeding.  Plavix complicated this.  jkl  . Sulfonamide Derivatives Hives  . Other Other (See Comments)    Narcotics-C. Diff  . Plavix [Clopidogrel Bisulfate]   . Latex Rash  . Tape Itching and Rash    Latex-Adhesive tape    History  Substance Use Topics  . Smoking status: Never Smoker   . Smokeless tobacco: Never Used  . Alcohol Use: No    Family History  Problem Relation Age of Onset  . Heart disease Father   . Stroke Father   . Colon cancer Neg Hx   . Pancreatic cancer Mother   . Diverticulosis Sister     x4     Review of Systems  Musculoskeletal: Positive for joint pain.       Right hip  All other systems reviewed and are negative.   Objective:  Physical Exam  Constitutional: He appears well-developed and well-nourished.  HENT:  Head: Normocephalic and atraumatic.  Eyes: Conjunctivae and EOM are normal. Pupils are equal, round, and reactive to light.  Neck: Normal range of motion.  Cardiovascular: Normal rate and regular rhythm.   Respiratory: Effort normal.  GI: Soft.  Musculoskeletal:  Right hip motion is maybe 30 total rotation.  He has terrible pain on internal rotation and the pain is in the thigh and knee.  He has no pain over the lesser trochanter today.  He walks with an altered gait using a cane.  There is no effusion about his knee where there is full motion and no pain to palpation.  Sensation and motor function are intact in his feet with palpable pulses on both sides.  There is no palpable lymphadenopathy behind either knee or at the groin.    Vital signs in last 24 hours:     Labs:   Estimated body mass index is 30.89 kg/(m^2) as calculated from the following:   Height as of 06/30/13: 5' 8.5" (1.74 m).   Weight as of 06/30/13: 93.532 kg (206 lb 3.2 oz).   Imaging Review Plain radiographs demonstrate severe degenerative joint disease of the right hip(s). The bone quality appears to be good for age and reported activity level.  Assessment/Plan:  End stage arthritis, right hip(s)  The patient history, physical examination, clinical judgement of the provider and imaging studies are consistent with end stage degenerative joint disease of the right hip(s) and total hip arthroplasty is deemed medically necessary. The treatment options including medical management, injection therapy, arthroscopy and arthroplasty were discussed at length. The risks and benefits of total hip arthroplasty were presented and reviewed. The  risks due to aseptic loosening, infection, stiffness, dislocation/subluxation,  thromboembolic complications and other imponderables were discussed.  The patient acknowledged the explanation, agreed to proceed with the plan and consent was signed. Patient is being admitted for inpatient treatment for surgery, pain control, PT, OT, prophylactic antibiotics, VTE prophylaxis, progressive ambulation and ADL's and discharge planning.The patient is planning to be discharged home with home health services

## 2013-08-02 MED ORDER — CEFAZOLIN SODIUM-DEXTROSE 2-3 GM-% IV SOLR
2.0000 g | INTRAVENOUS | Status: AC
  Administered 2013-08-03: 2 g via INTRAVENOUS
  Filled 2013-08-02: qty 50

## 2013-08-02 MED ORDER — CHLORHEXIDINE GLUCONATE 4 % EX LIQD
60.0000 mL | Freq: Once | CUTANEOUS | Status: DC
  Filled 2013-08-02: qty 60

## 2013-08-02 MED ORDER — LACTATED RINGERS IV SOLN
INTRAVENOUS | Status: DC
  Administered 2013-08-03: 08:00:00 via INTRAVENOUS

## 2013-08-03 ENCOUNTER — Encounter (HOSPITAL_COMMUNITY): Payer: Medicare Other | Admitting: Anesthesiology

## 2013-08-03 ENCOUNTER — Inpatient Hospital Stay (HOSPITAL_COMMUNITY): Payer: Medicare Other | Admitting: Anesthesiology

## 2013-08-03 ENCOUNTER — Inpatient Hospital Stay (HOSPITAL_COMMUNITY)
Admission: RE | Admit: 2013-08-03 | Discharge: 2013-08-18 | DRG: 469 | Disposition: A | Discharge status: Skilled Nursing Facility | Payer: Medicare Other | Source: Ambulatory Visit | Attending: Internal Medicine | Admitting: Internal Medicine

## 2013-08-03 ENCOUNTER — Encounter (HOSPITAL_COMMUNITY)
Admission: RE | Disposition: A | Discharge status: Skilled Nursing Facility | Payer: Self-pay | Source: Ambulatory Visit | Attending: Orthopaedic Surgery

## 2013-08-03 ENCOUNTER — Inpatient Hospital Stay (HOSPITAL_COMMUNITY): Payer: Medicare Other

## 2013-08-03 ENCOUNTER — Encounter (HOSPITAL_COMMUNITY): Payer: Self-pay | Admitting: Anesthesiology

## 2013-08-03 DIAGNOSIS — N401 Enlarged prostate with lower urinary tract symptoms: Secondary | ICD-10-CM

## 2013-08-03 DIAGNOSIS — D631 Anemia in chronic kidney disease: Secondary | ICD-10-CM | POA: Diagnosis present

## 2013-08-03 DIAGNOSIS — I272 Pulmonary hypertension, unspecified: Secondary | ICD-10-CM

## 2013-08-03 DIAGNOSIS — I2510 Atherosclerotic heart disease of native coronary artery without angina pectoris: Secondary | ICD-10-CM | POA: Diagnosis present

## 2013-08-03 DIAGNOSIS — Z9861 Coronary angioplasty status: Secondary | ICD-10-CM

## 2013-08-03 DIAGNOSIS — A419 Sepsis, unspecified organism: Secondary | ICD-10-CM | POA: Diagnosis present

## 2013-08-03 DIAGNOSIS — A0471 Enterocolitis due to Clostridium difficile, recurrent: Secondary | ICD-10-CM

## 2013-08-03 DIAGNOSIS — Z8711 Personal history of peptic ulcer disease: Secondary | ICD-10-CM

## 2013-08-03 DIAGNOSIS — N281 Cyst of kidney, acquired: Secondary | ICD-10-CM

## 2013-08-03 DIAGNOSIS — N1832 Chronic kidney disease, stage 3b: Secondary | ICD-10-CM | POA: Diagnosis present

## 2013-08-03 DIAGNOSIS — G9340 Encephalopathy, unspecified: Secondary | ICD-10-CM | POA: Diagnosis not present

## 2013-08-03 DIAGNOSIS — R4182 Altered mental status, unspecified: Secondary | ICD-10-CM

## 2013-08-03 DIAGNOSIS — N179 Acute kidney failure, unspecified: Secondary | ICD-10-CM | POA: Diagnosis not present

## 2013-08-03 DIAGNOSIS — R6520 Severe sepsis without septic shock: Secondary | ICD-10-CM | POA: Diagnosis present

## 2013-08-03 DIAGNOSIS — N400 Enlarged prostate without lower urinary tract symptoms: Secondary | ICD-10-CM | POA: Diagnosis present

## 2013-08-03 DIAGNOSIS — R5381 Other malaise: Secondary | ICD-10-CM | POA: Diagnosis present

## 2013-08-03 DIAGNOSIS — F411 Generalized anxiety disorder: Secondary | ICD-10-CM | POA: Diagnosis present

## 2013-08-03 DIAGNOSIS — A0472 Enterocolitis due to Clostridium difficile, not specified as recurrent: Secondary | ICD-10-CM | POA: Diagnosis present

## 2013-08-03 DIAGNOSIS — Z6833 Body mass index (BMI) 33.0-33.9, adult: Secondary | ICD-10-CM

## 2013-08-03 DIAGNOSIS — K2100 Gastro-esophageal reflux disease with esophagitis, without bleeding: Secondary | ICD-10-CM | POA: Diagnosis present

## 2013-08-03 DIAGNOSIS — N189 Chronic kidney disease, unspecified: Secondary | ICD-10-CM

## 2013-08-03 DIAGNOSIS — E869 Volume depletion, unspecified: Secondary | ICD-10-CM

## 2013-08-03 DIAGNOSIS — E872 Acidosis, unspecified: Secondary | ICD-10-CM | POA: Diagnosis not present

## 2013-08-03 DIAGNOSIS — E876 Hypokalemia: Secondary | ICD-10-CM | POA: Diagnosis present

## 2013-08-03 DIAGNOSIS — E785 Hyperlipidemia, unspecified: Secondary | ICD-10-CM | POA: Diagnosis present

## 2013-08-03 DIAGNOSIS — M169 Osteoarthritis of hip, unspecified: Secondary | ICD-10-CM

## 2013-08-03 DIAGNOSIS — R509 Fever, unspecified: Secondary | ICD-10-CM

## 2013-08-03 DIAGNOSIS — K209 Esophagitis, unspecified without bleeding: Secondary | ICD-10-CM | POA: Diagnosis present

## 2013-08-03 DIAGNOSIS — N183 Chronic kidney disease, stage 3 unspecified: Secondary | ICD-10-CM

## 2013-08-03 DIAGNOSIS — Q619 Cystic kidney disease, unspecified: Secondary | ICD-10-CM

## 2013-08-03 DIAGNOSIS — I252 Old myocardial infarction: Secondary | ICD-10-CM

## 2013-08-03 DIAGNOSIS — K560 Paralytic ileus: Secondary | ICD-10-CM | POA: Diagnosis not present

## 2013-08-03 DIAGNOSIS — R339 Retention of urine, unspecified: Secondary | ICD-10-CM | POA: Diagnosis not present

## 2013-08-03 DIAGNOSIS — M1610 Unilateral primary osteoarthritis, unspecified hip: Secondary | ICD-10-CM | POA: Diagnosis not present

## 2013-08-03 DIAGNOSIS — I480 Paroxysmal atrial fibrillation: Secondary | ICD-10-CM | POA: Diagnosis not present

## 2013-08-03 DIAGNOSIS — I2789 Other specified pulmonary heart diseases: Secondary | ICD-10-CM | POA: Diagnosis present

## 2013-08-03 DIAGNOSIS — M1611 Unilateral primary osteoarthritis, right hip: Secondary | ICD-10-CM

## 2013-08-03 DIAGNOSIS — E870 Hyperosmolality and hypernatremia: Secondary | ICD-10-CM | POA: Diagnosis not present

## 2013-08-03 DIAGNOSIS — N2889 Other specified disorders of kidney and ureter: Secondary | ICD-10-CM | POA: Diagnosis present

## 2013-08-03 DIAGNOSIS — Z981 Arthrodesis status: Secondary | ICD-10-CM

## 2013-08-03 DIAGNOSIS — R140 Abdominal distension (gaseous): Secondary | ICD-10-CM

## 2013-08-03 DIAGNOSIS — K567 Ileus, unspecified: Secondary | ICD-10-CM | POA: Diagnosis present

## 2013-08-03 DIAGNOSIS — I4891 Unspecified atrial fibrillation: Secondary | ICD-10-CM | POA: Diagnosis not present

## 2013-08-03 DIAGNOSIS — K449 Diaphragmatic hernia without obstruction or gangrene: Secondary | ICD-10-CM | POA: Diagnosis present

## 2013-08-03 DIAGNOSIS — R195 Other fecal abnormalities: Secondary | ICD-10-CM

## 2013-08-03 DIAGNOSIS — D72829 Elevated white blood cell count, unspecified: Secondary | ICD-10-CM

## 2013-08-03 DIAGNOSIS — E669 Obesity, unspecified: Secondary | ICD-10-CM | POA: Diagnosis present

## 2013-08-03 DIAGNOSIS — I10 Essential (primary) hypertension: Secondary | ICD-10-CM

## 2013-08-03 DIAGNOSIS — I129 Hypertensive chronic kidney disease with stage 1 through stage 4 chronic kidney disease, or unspecified chronic kidney disease: Secondary | ICD-10-CM | POA: Diagnosis present

## 2013-08-03 DIAGNOSIS — K9189 Other postprocedural complications and disorders of digestive system: Secondary | ICD-10-CM

## 2013-08-03 DIAGNOSIS — D62 Acute posthemorrhagic anemia: Secondary | ICD-10-CM | POA: Diagnosis not present

## 2013-08-03 DIAGNOSIS — N138 Other obstructive and reflux uropathy: Secondary | ICD-10-CM | POA: Diagnosis present

## 2013-08-03 DIAGNOSIS — R338 Other retention of urine: Secondary | ICD-10-CM

## 2013-08-03 DIAGNOSIS — R7309 Other abnormal glucose: Secondary | ICD-10-CM | POA: Diagnosis present

## 2013-08-03 DIAGNOSIS — I959 Hypotension, unspecified: Secondary | ICD-10-CM | POA: Diagnosis present

## 2013-08-03 DIAGNOSIS — Z96649 Presence of unspecified artificial hip joint: Secondary | ICD-10-CM

## 2013-08-03 DIAGNOSIS — K922 Gastrointestinal hemorrhage, unspecified: Secondary | ICD-10-CM | POA: Diagnosis present

## 2013-08-03 HISTORY — PX: TOTAL HIP ARTHROPLASTY: SHX124

## 2013-08-03 SURGERY — ARTHROPLASTY, HIP, TOTAL, ANTERIOR APPROACH
Anesthesia: General | Site: Hip | Laterality: Right

## 2013-08-03 MED ORDER — ONDANSETRON HCL 4 MG/2ML IJ SOLN: 4.0000 mg | Freq: Once | INTRAMUSCULAR | Status: DC | PRN

## 2013-08-03 MED ORDER — MEPERIDINE HCL 25 MG/ML IJ SOLN: 6.2500 mg | INTRAMUSCULAR | Status: DC | PRN

## 2013-08-03 MED ORDER — PROPOFOL 10 MG/ML IV BOLUS
INTRAVENOUS | Status: AC
  Filled 2013-08-03: qty 20

## 2013-08-03 MED ORDER — TESTOSTERONE 50 MG/5GM (1%) TD GEL
5.0000 g | Freq: Every day | TRANSDERMAL | Status: DC
Start: 2013-08-03 — End: 2013-08-03

## 2013-08-03 MED ORDER — PHENOL 1.4 % MT LIQD: 1.0000 | OROMUCOSAL | Status: DC | PRN

## 2013-08-03 MED ORDER — GLYCOPYRROLATE 0.2 MG/ML IJ SOLN
INTRAMUSCULAR | Status: DC | PRN
  Administered 2013-08-03: 0.2 mg via INTRAVENOUS
  Administered 2013-08-03: 0.4 mg via INTRAVENOUS
  Administered 2013-08-03: 0.2 mg via INTRAVENOUS

## 2013-08-03 MED ORDER — PHENYLEPHRINE HCL 10 MG/ML IJ SOLN
INTRAMUSCULAR | Status: DC | PRN
  Administered 2013-08-03 (×5): 80 ug via INTRAVENOUS
  Administered 2013-08-03: 120 ug via INTRAVENOUS
  Administered 2013-08-03 (×5): 80 ug via INTRAVENOUS

## 2013-08-03 MED ORDER — PROPOFOL 10 MG/ML IV BOLUS
INTRAVENOUS | Status: DC | PRN
  Administered 2013-08-03: 100 mg via INTRAVENOUS

## 2013-08-03 MED ORDER — VANCOMYCIN HCL IN DEXTROSE 1-5 GM/200ML-% IV SOLN: 1000.0000 mg | Freq: Two times a day (BID) | INTRAVENOUS | Status: DC

## 2013-08-03 MED ORDER — CEFAZOLIN SODIUM-DEXTROSE 2-3 GM-% IV SOLR
2.0000 g | Freq: Once | INTRAVENOUS | Status: AC
  Administered 2013-08-03: 2 g via INTRAVENOUS
  Filled 2013-08-03: qty 50

## 2013-08-03 MED ORDER — CITALOPRAM HYDROBROMIDE 40 MG PO TABS
40.0000 mg | ORAL_TABLET | Freq: Every day | ORAL | Status: DC
  Administered 2013-08-04 – 2013-08-08 (×5): 40 mg via ORAL
  Filled 2013-08-03 (×5): qty 1

## 2013-08-03 MED ORDER — GLYCOPYRROLATE 0.2 MG/ML IJ SOLN
INTRAMUSCULAR | Status: AC
  Filled 2013-08-03: qty 1

## 2013-08-03 MED ORDER — HYDROMORPHONE HCL PF 1 MG/ML IJ SOLN
0.2500 mg | INTRAMUSCULAR | Status: DC | PRN
  Administered 2013-08-03: 0.25 mg via INTRAVENOUS
  Administered 2013-08-03: 0.5 mg via INTRAVENOUS
  Administered 2013-08-03: 0.25 mg via INTRAVENOUS
  Administered 2013-08-03 (×2): 0.5 mg via INTRAVENOUS

## 2013-08-03 MED ORDER — ROCURONIUM BROMIDE 100 MG/10ML IV SOLN
INTRAVENOUS | Status: DC | PRN
  Administered 2013-08-03: 50 mg via INTRAVENOUS

## 2013-08-03 MED ORDER — ONDANSETRON HCL 4 MG PO TABS: 4.0000 mg | ORAL_TABLET | Freq: Four times a day (QID) | ORAL | Status: DC | PRN

## 2013-08-03 MED ORDER — MORPHINE SULFATE 2 MG/ML IJ SOLN
2.0000 mg | INTRAMUSCULAR | Status: DC | PRN
  Administered 2013-08-03 – 2013-08-04 (×3): 2 mg via INTRAVENOUS
  Filled 2013-08-03 (×2): qty 1

## 2013-08-03 MED ORDER — CEFAZOLIN SODIUM-DEXTROSE 2-3 GM-% IV SOLR
2.0000 g | Freq: Four times a day (QID) | INTRAVENOUS | Status: DC
  Filled 2013-08-03 (×2): qty 50

## 2013-08-03 MED ORDER — NEOSTIGMINE METHYLSULFATE 10 MG/10ML IV SOLN
INTRAVENOUS | Status: AC
  Filled 2013-08-03: qty 1

## 2013-08-03 MED ORDER — EPHEDRINE SULFATE 50 MG/ML IJ SOLN
INTRAMUSCULAR | Status: AC
  Filled 2013-08-03: qty 1

## 2013-08-03 MED ORDER — LORAZEPAM 0.5 MG PO TABS
0.2500 mg | ORAL_TABLET | Freq: Every day | ORAL | Status: DC
  Administered 2013-08-03 – 2013-08-04 (×2): 0.25 mg via ORAL
  Filled 2013-08-03 (×2): qty 1

## 2013-08-03 MED ORDER — HYDROMORPHONE HCL PF 1 MG/ML IJ SOLN
INTRAMUSCULAR | Status: AC
  Filled 2013-08-03: qty 1

## 2013-08-03 MED ORDER — ONDANSETRON HCL 4 MG/2ML IJ SOLN
INTRAMUSCULAR | Status: DC | PRN
  Administered 2013-08-03: 4 mg via INTRAVENOUS

## 2013-08-03 MED ORDER — PHENYLEPHRINE 40 MCG/ML (10ML) SYRINGE FOR IV PUSH (FOR BLOOD PRESSURE SUPPORT)
PREFILLED_SYRINGE | INTRAVENOUS | Status: AC
  Filled 2013-08-03: qty 10

## 2013-08-03 MED ORDER — DOXAZOSIN MESYLATE 8 MG PO TABS
8.0000 mg | ORAL_TABLET | Freq: Every day | ORAL | Status: DC
  Administered 2013-08-03 – 2013-08-04 (×2): 8 mg via ORAL
  Filled 2013-08-03 (×3): qty 1

## 2013-08-03 MED ORDER — OXYCODONE HCL 5 MG PO TABS: 5.0000 mg | ORAL_TABLET | Freq: Once | ORAL | Status: DC | PRN

## 2013-08-03 MED ORDER — LACTATED RINGERS IV SOLN
INTRAVENOUS | Status: DC | PRN
  Administered 2013-08-03 (×2): via INTRAVENOUS

## 2013-08-03 MED ORDER — OXYCODONE HCL 5 MG PO TABS
ORAL_TABLET | ORAL | Status: AC
  Filled 2013-08-03: qty 2

## 2013-08-03 MED ORDER — FUROSEMIDE 20 MG PO TABS
20.0000 mg | ORAL_TABLET | Freq: Every day | ORAL | Status: DC
  Administered 2013-08-04 – 2013-08-05 (×2): 20 mg via ORAL
  Filled 2013-08-03 (×4): qty 1

## 2013-08-03 MED ORDER — ACETAMINOPHEN 325 MG PO TABS
650.0000 mg | ORAL_TABLET | Freq: Four times a day (QID) | ORAL | Status: DC | PRN
  Administered 2013-08-04 – 2013-08-18 (×22): 650 mg via ORAL
  Filled 2013-08-03 (×22): qty 2

## 2013-08-03 MED ORDER — HYDROMORPHONE HCL PF 1 MG/ML IJ SOLN: 0.2500 mg | INTRAMUSCULAR | Status: DC | PRN

## 2013-08-03 MED ORDER — BISOPROLOL FUMARATE 5 MG PO TABS
2.5000 mg | ORAL_TABLET | Freq: Every day | ORAL | Status: DC
  Administered 2013-08-04 – 2013-08-07 (×4): 2.5 mg via ORAL
  Filled 2013-08-03 (×5): qty 0.5

## 2013-08-03 MED ORDER — DIPHENHYDRAMINE HCL 12.5 MG/5ML PO ELIX
12.5000 mg | ORAL_SOLUTION | ORAL | Status: DC | PRN
  Filled 2013-08-03: qty 10

## 2013-08-03 MED ORDER — TRANEXAMIC ACID 100 MG/ML IV SOLN
1000.0000 mg | INTRAVENOUS | Status: DC
  Filled 2013-08-03: qty 10

## 2013-08-03 MED ORDER — ONDANSETRON HCL 4 MG/2ML IJ SOLN
4.0000 mg | Freq: Four times a day (QID) | INTRAMUSCULAR | Status: DC | PRN
  Administered 2013-08-05 – 2013-08-13 (×8): 4 mg via INTRAVENOUS
  Filled 2013-08-03 (×8): qty 2

## 2013-08-03 MED ORDER — VANCOMYCIN HCL IN DEXTROSE 1-5 GM/200ML-% IV SOLN
1000.0000 mg | INTRAVENOUS | Status: AC
  Administered 2013-08-03: 1000 mg via INTRAVENOUS
  Filled 2013-08-03: qty 200

## 2013-08-03 MED ORDER — SIMVASTATIN 10 MG PO TABS: 10.0000 mg | ORAL_TABLET | Freq: Every day | ORAL | Status: DC

## 2013-08-03 MED ORDER — 0.9 % SODIUM CHLORIDE (POUR BTL) OPTIME
TOPICAL | Status: DC | PRN
  Administered 2013-08-03: 1000 mL

## 2013-08-03 MED ORDER — FENTANYL CITRATE 0.05 MG/ML IJ SOLN
INTRAMUSCULAR | Status: AC
  Filled 2013-08-03: qty 5

## 2013-08-03 MED ORDER — SIMVASTATIN 40 MG PO TABS
40.0000 mg | ORAL_TABLET | Freq: Every day | ORAL | Status: DC
  Administered 2013-08-04 – 2013-08-07 (×5): 40 mg via ORAL
  Filled 2013-08-03 (×8): qty 1

## 2013-08-03 MED ORDER — DOCUSATE SODIUM 100 MG PO CAPS
100.0000 mg | ORAL_CAPSULE | Freq: Two times a day (BID) | ORAL | Status: DC
  Administered 2013-08-03 – 2013-08-07 (×4): 100 mg via ORAL
  Filled 2013-08-03 (×9): qty 1

## 2013-08-03 MED ORDER — DEXTROSE IN LACTATED RINGERS 5 % IV SOLN
75.0000 mL/h | INTRAVENOUS | Status: DC
  Administered 2013-08-03 – 2013-08-06 (×4): 75 mL/h via INTRAVENOUS
  Filled 2013-08-03: qty 1000

## 2013-08-03 MED ORDER — LIDOCAINE HCL (CARDIAC) 20 MG/ML IV SOLN
INTRAVENOUS | Status: DC | PRN
  Administered 2013-08-03: 100 mg via INTRAVENOUS

## 2013-08-03 MED ORDER — ASPIRIN EC 325 MG PO TBEC
325.0000 mg | DELAYED_RELEASE_TABLET | Freq: Two times a day (BID) | ORAL | Status: DC
  Administered 2013-08-03 – 2013-08-04 (×3): 325 mg via ORAL
  Filled 2013-08-03 (×7): qty 1

## 2013-08-03 MED ORDER — ACETAMINOPHEN 650 MG RE SUPP: 650.0000 mg | Freq: Four times a day (QID) | RECTAL | Status: DC | PRN

## 2013-08-03 MED ORDER — OXYCODONE HCL 5 MG/5ML PO SOLN: 5.0000 mg | Freq: Once | ORAL | Status: DC | PRN

## 2013-08-03 MED ORDER — ONDANSETRON HCL 4 MG/2ML IJ SOLN
INTRAMUSCULAR | Status: AC
  Filled 2013-08-03: qty 2

## 2013-08-03 MED ORDER — NEOSTIGMINE METHYLSULFATE 10 MG/10ML IV SOLN
INTRAVENOUS | Status: DC | PRN
  Administered 2013-08-03: 3 mg via INTRAVENOUS

## 2013-08-03 MED ORDER — ROCURONIUM BROMIDE 50 MG/5ML IV SOLN
INTRAVENOUS | Status: AC
  Filled 2013-08-03: qty 1

## 2013-08-03 MED ORDER — METOCLOPRAMIDE HCL 5 MG/ML IJ SOLN
5.0000 mg | Freq: Three times a day (TID) | INTRAMUSCULAR | Status: DC | PRN
  Administered 2013-08-05: 10 mg via INTRAVENOUS
  Filled 2013-08-03: qty 2

## 2013-08-03 MED ORDER — OXYCODONE HCL 5 MG PO TABS
5.0000 mg | ORAL_TABLET | ORAL | Status: DC | PRN
  Administered 2013-08-03 – 2013-08-04 (×6): 10 mg via ORAL
  Filled 2013-08-03 (×6): qty 2

## 2013-08-03 MED ORDER — FERROUS SULFATE 325 (65 FE) MG PO TABS
325.0000 mg | ORAL_TABLET | Freq: Three times a day (TID) | ORAL | Status: DC
  Administered 2013-08-03 – 2013-08-07 (×9): 325 mg via ORAL
  Filled 2013-08-03 (×15): qty 1

## 2013-08-03 MED ORDER — FLUTICASONE PROPIONATE 50 MCG/ACT NA SUSP
2.0000 | Freq: Every day | NASAL | Status: DC
  Administered 2013-08-04 – 2013-08-18 (×15): 2 via NASAL
  Filled 2013-08-03 (×3): qty 16

## 2013-08-03 MED ORDER — METOCLOPRAMIDE HCL 10 MG PO TABS: 5.0000 mg | ORAL_TABLET | Freq: Three times a day (TID) | ORAL | Status: DC | PRN

## 2013-08-03 MED ORDER — LIDOCAINE HCL (CARDIAC) 20 MG/ML IV SOLN
INTRAVENOUS | Status: AC
  Filled 2013-08-03: qty 5

## 2013-08-03 MED ORDER — GLYCOPYRROLATE 0.2 MG/ML IJ SOLN
INTRAMUSCULAR | Status: AC
  Filled 2013-08-03: qty 2

## 2013-08-03 MED ORDER — MENTHOL 3 MG MT LOZG: 1.0000 | LOZENGE | OROMUCOSAL | Status: DC | PRN

## 2013-08-03 MED ORDER — ALUM & MAG HYDROXIDE-SIMETH 200-200-20 MG/5ML PO SUSP
30.0000 mL | ORAL | Status: DC | PRN
  Administered 2013-08-04: 30 mL via ORAL
  Filled 2013-08-03: qty 30

## 2013-08-03 MED ORDER — LORATADINE 10 MG PO TABS
10.0000 mg | ORAL_TABLET | Freq: Every day | ORAL | Status: DC
  Administered 2013-08-04 – 2013-08-07 (×3): 10 mg via ORAL
  Filled 2013-08-03 (×4): qty 1

## 2013-08-03 MED ORDER — TAMSULOSIN HCL 0.4 MG PO CAPS
0.4000 mg | ORAL_CAPSULE | Freq: Every day | ORAL | Status: DC
  Administered 2013-08-03 – 2013-08-06 (×2): 0.4 mg via ORAL
  Filled 2013-08-03 (×5): qty 1

## 2013-08-03 MED ORDER — FENTANYL CITRATE 0.05 MG/ML IJ SOLN
INTRAMUSCULAR | Status: DC | PRN
  Administered 2013-08-03: 50 ug via INTRAVENOUS
  Administered 2013-08-03: 100 ug via INTRAVENOUS
  Administered 2013-08-03 (×2): 50 ug via INTRAVENOUS

## 2013-08-03 MED ORDER — GABAPENTIN 400 MG PO CAPS
400.0000 mg | ORAL_CAPSULE | Freq: Three times a day (TID) | ORAL | Status: DC
  Administered 2013-08-03 – 2013-08-07 (×10): 400 mg via ORAL
  Filled 2013-08-03 (×14): qty 1

## 2013-08-03 MED ORDER — NITROGLYCERIN 0.4 MG SL SUBL: 0.4000 mg | SUBLINGUAL_TABLET | SUBLINGUAL | Status: DC | PRN

## 2013-08-03 MED ORDER — EPHEDRINE SULFATE 50 MG/ML IJ SOLN
INTRAMUSCULAR | Status: DC | PRN
  Administered 2013-08-03: 20 mg via INTRAVENOUS
  Administered 2013-08-03 (×2): 5 mg via INTRAVENOUS
  Administered 2013-08-03: 10 mg via INTRAVENOUS

## 2013-08-03 MED ORDER — TRAMADOL-ACETAMINOPHEN 37.5-325 MG PO TABS
2.0000 | ORAL_TABLET | Freq: Three times a day (TID) | ORAL | Status: DC | PRN
  Administered 2013-08-04: 2 via ORAL
  Filled 2013-08-03 (×2): qty 1

## 2013-08-03 SURGICAL SUPPLY — 48 items
BLADE SAW SGTL 18X1.27X75 (BLADE) ×2 IMPLANT
BLADE SAW SGTL 18X1.27X75MM (BLADE) ×1
BLADE SURG ROTATE 9660 (MISCELLANEOUS) IMPLANT
CAPT HIP PF MOP ×3 IMPLANT
CELLS DAT CNTRL 66122 CELL SVR (MISCELLANEOUS) ×1 IMPLANT
COVER PERINEAL POST (MISCELLANEOUS) ×3 IMPLANT
COVER SURGICAL LIGHT HANDLE (MISCELLANEOUS) ×3 IMPLANT
DRAPE C-ARM 42X72 X-RAY (DRAPES) ×3 IMPLANT
DRAPE STERI IOBAN 125X83 (DRAPES) ×3 IMPLANT
DRAPE U-SHAPE 47X51 STRL (DRAPES) ×9 IMPLANT
DRSG AQUACEL AG ADV 3.5X10 (GAUZE/BANDAGES/DRESSINGS) ×3 IMPLANT
DURAPREP 26ML APPLICATOR (WOUND CARE) ×3 IMPLANT
ELECT BLADE 4.0 EZ CLEAN MEGAD (MISCELLANEOUS) ×3
ELECT CAUTERY BLADE 6.4 (BLADE) ×3 IMPLANT
ELECT REM PT RETURN 9FT ADLT (ELECTROSURGICAL) ×3
ELECTRODE BLDE 4.0 EZ CLN MEGD (MISCELLANEOUS) ×1 IMPLANT
ELECTRODE REM PT RTRN 9FT ADLT (ELECTROSURGICAL) ×1 IMPLANT
FACESHIELD WRAPAROUND (MASK) ×9 IMPLANT
GLOVE BIO SURGEON STRL SZ8 (GLOVE) ×9 IMPLANT
GLOVE BIOGEL PI IND STRL 8 (GLOVE) ×1 IMPLANT
GLOVE BIOGEL PI INDICATOR 8 (GLOVE) ×2
GLOVE SS BIOGEL STRL SZ 8 (GLOVE) ×2 IMPLANT
GLOVE SUPERSENSE BIOGEL SZ 8 (GLOVE) ×4
GOWN STRL REUS W/ TWL LRG LVL3 (GOWN DISPOSABLE) ×2 IMPLANT
GOWN STRL REUS W/ TWL XL LVL3 (GOWN DISPOSABLE) ×1 IMPLANT
GOWN STRL REUS W/TWL LRG LVL3 (GOWN DISPOSABLE) ×4
GOWN STRL REUS W/TWL XL LVL3 (GOWN DISPOSABLE) ×2
KIT BASIN OR (CUSTOM PROCEDURE TRAY) ×3 IMPLANT
KIT ROOM TURNOVER OR (KITS) ×3 IMPLANT
LINER BOOT UNIVERSAL DISP (MISCELLANEOUS) ×3 IMPLANT
MANIFOLD NEPTUNE II (INSTRUMENTS) ×3 IMPLANT
NS IRRIG 1000ML POUR BTL (IV SOLUTION) ×3 IMPLANT
PACK TOTAL JOINT (CUSTOM PROCEDURE TRAY) ×3 IMPLANT
PAD ARMBOARD 7.5X6 YLW CONV (MISCELLANEOUS) ×6 IMPLANT
RTRCTR WOUND ALEXIS 18CM MED (MISCELLANEOUS) ×3
STAPLER VISISTAT 35W (STAPLE) ×3 IMPLANT
SUT ETHIBOND NAB CT1 #1 30IN (SUTURE) ×15 IMPLANT
SUT VIC AB 0 CT1 27 (SUTURE)
SUT VIC AB 0 CT1 27XBRD ANBCTR (SUTURE) IMPLANT
SUT VIC AB 1 CT1 27 (SUTURE) ×2
SUT VIC AB 1 CT1 27XBRD ANBCTR (SUTURE) ×1 IMPLANT
SUT VIC AB 2-0 CT1 27 (SUTURE) ×2
SUT VIC AB 2-0 CT1 TAPERPNT 27 (SUTURE) ×1 IMPLANT
SUT VLOC 180 0 24IN GS25 (SUTURE) ×3 IMPLANT
TOWEL OR 17X24 6PK STRL BLUE (TOWEL DISPOSABLE) ×3 IMPLANT
TOWEL OR 17X26 10 PK STRL BLUE (TOWEL DISPOSABLE) ×6 IMPLANT
TRAY FOLEY CATH 14FR (SET/KITS/TRAYS/PACK) IMPLANT
WATER STERILE IRR 1000ML POUR (IV SOLUTION) IMPLANT

## 2013-08-03 NOTE — Progress Notes (Signed)
Utilization review completed.  

## 2013-08-03 NOTE — Interval H&P Note (Signed)
History and Physical Interval Note:  08/03/2013 10:38 AM  Gary [name]  has presented today for surgery, with the diagnosis of RIGHT HIP DEGENERATIVE JOINT DISEASE  The various methods of treatment have been discussed with the patient and family. After consideration of risks, benefits and other options for treatment, the patient has consented to  Procedure(s): TOTAL HIP ARTHROPLASTY ANTERIOR APPROACH (Right) as a surgical intervention .  The patient's history has been reviewed, patient examined, no change in status, stable for surgery.  I have reviewed the patient's chart and labs.  Questions were answered to the patient's satisfaction.     DALLDORF,PETER G

## 2013-08-03 NOTE — Transfer of Care (Signed)
Immediate Anesthesia Transfer of Care Note  Patient: Gary [name]  Procedure(s) Performed: Procedure(s): TOTAL HIP ARTHROPLASTY ANTERIOR APPROACH (Right)  Patient Location: PACU  Anesthesia Type:General  Level of Consciousness: awake and alert   Airway & Oxygen Therapy: Patient Spontanous Breathing and Patient connected to nasal cannula oxygen  Post-op Assessment: Report given to PACU RN and Post -op Vital signs reviewed and stable  Post vital signs: Reviewed and stable  Complications: No apparent anesthesia complications

## 2013-08-03 NOTE — Anesthesia Postprocedure Evaluation (Signed)
Anesthesia Post Note  Patient: Gary [name]  Procedure(s) Performed: Procedure(s) (LRB): TOTAL HIP ARTHROPLASTY ANTERIOR APPROACH (Right)  Anesthesia type: general  Patient location: PACU  Post pain: Pain level controlled  Post assessment: Patient's Cardiovascular Status Stable  Last Vitals:  Filed Vitals:   08/03/13 1521  BP: 108/49  Pulse: 70  Temp: 36.4 C  Resp: 14    Post vital signs: Reviewed and stable  Level of consciousness: sedated  Complications: No apparent anesthesia complications

## 2013-08-03 NOTE — Anesthesia Preprocedure Evaluation (Addendum)
Anesthesia Evaluation  Patient identified by MRN, date of birth, ID band Patient awake    Reviewed: Allergy & Precautions, H&P , NPO status , Patient's Chart, lab work & pertinent test results  Airway Mallampati: I TM Distance: >3 FB Neck ROM: Full    Dental   Pulmonary          Cardiovascular hypertension, Pt. on medications + CAD and + Past MI     Neuro/Psych Anxiety  Neuromuscular disease    GI/Hepatic GERD-  Medicated and Controlled,  Endo/Other    Renal/GU CRF and Renal InsufficiencyRenal disease     Musculoskeletal   Abdominal   Peds  Hematology  (+) anemia ,   Anesthesia Other Findings   Reproductive/Obstetrics                          Anesthesia Physical Anesthesia Plan  ASA: III  Anesthesia Plan: General   Post-op Pain Management:    Induction: Intravenous  Airway Management Planned: Oral ETT  Additional Equipment:   Intra-op Plan:   Post-operative Plan: Extubation in OR  Informed Consent: I have reviewed the patients History and Physical, chart, labs and discussed the procedure including the risks, benefits and alternatives for the proposed anesthesia with the patient or authorized representative who has indicated his/her understanding and acceptance.     Plan Discussed with: CRNA and Surgeon  Anesthesia Plan Comments:        Anesthesia Quick Evaluation

## 2013-08-03 NOTE — Op Note (Signed)
PRE-OP DIAGNOSIS:  RIGHT HIP DEGENERATIVE JOINT DISEASE POST-OP DIAGNOSIS:  same PROCEDURE: RIGHT TOTAL HIP ARTHROPLASTY ANTERIOR APPROACH ANESTHESIA:  General SURGEON:  Melrose Nakayama MD ASSISTANT:  Cleotis Nipper PA-C   INDICATIONS FOR PROCEDURE:  The patient is a 80 y.o. male with a long history of a painful hip.  This has persisted despite multiple conservative measures.  The patient has persisted with pain and dysfunction making rest and activity difficult.  A total hip replacement is offered as surgical treatment.  Informed operative consent was obtained after discussion of possible complications including reaction to anesthesia, infection, neurovascular injury, dislocation, DVT, PE, and death.  The importance of the postoperative rehab program to optimize result was stressed with the patient.  SUMMARY OF FINDINGS AND PROCEDURE:  Under general anesthesia through a anterior approach an the Hana table a right THR was performed.  The patient had severe degenerative change and excellent bone quality.  We used DePuy components to replace the hip and these were size KA 13 Corail femur capped with a 32  -2  Stainless steel hip ball.  On the acetabular side we used a size 52 Gription shell with a  plus 4 neutral polyethylene liner.  We did use a hole eliminator.  Loni Dolly PA-C assisted throughout and was invaluable to the completion of the case in that he helped position and retract while I performed the procedure.  He also closed simultaneously to help minimize OR time.  I used fluoroscopy throughout the case to check position of implants and leg lengths and read all of these views myself.  DESCRIPTION OF PROCEDURE:  The patient was taken to the OR suite where general anesthetic was applied.  The patient was then positioned on the Hana table supine.  All bony prominences were appropriately padded.  Prep and drape was then performed in normal sterile fashion.  The patient was given Kefzol and  vancomycin preoperative antibiotic and an appropriate time out was performed.  We then took an anterior approach to the right hip.  Dissection was taken through adipose to the tensor fascia lata fascia.  This structure was incised longitudinally and we dissected in the intermuscular interval just medial to this muscle.  Cobra retractors were placed superior and inferior to the femoral neck superficial to the capsule.  A capsular incision was then made and the retractors were placed along the femoral neck.  Xray was brought in to get a good level for the femoral neck cut which was made with an oscillating saw and osteotome.  The femoral head was removed with a corkscrew.  The acetabulum was exposed and some labral tissues were excised. Reaming was taken to the inside wall of the pelvis and sequentially up to 1 mm smaller than the actual component.  A trial of components was done and then the aforementioned acetabular shell was placed in appropriate tilt and anteversion confirmed by fluoroscopy. The liner was placed along with the hole eliminator and attention was turned to the femur.  The leg was brought down and over into adduction and the elevator bar was used to raise the femur up gently in the wound.  The piriformis was released with care taken to preserve the obturator internus attachment and all of the posterior capsule. The femur was reamed and then broached to the appropriate size.  A trial reduction was done and the aforementioned head and neck assembly gave [place] the best stability in extension with external rotation.  Leg lengths were felt to be  about equal by fluoroscopic exam.  The trial components were removed and the wound irrigated.  We then placed the femoral component in appropriate anteversion.  The head was applied to a dry stem neck and the hip again reduced.  It was again stable in the aforementioned position.  The would was irrigated again followed by re-approximation of anterior capsule with  ethibond suture. Tensor fascia was repaired with V-loc suture  followed by subcutaneous closure with #O and #2 undyed vicryl.  Skin was closed with staples followed by a sterile dressing.  EBL and IOF can be obtained from anesthesia records.  DISPOSITION:  The patient was extubated in the OR and taken to PACU in stable condition to be admitted to the Orthopedic Surgery for appropriate post-op care to include perioperative antibiotics and DVT prophylaxis.

## 2013-08-04 DIAGNOSIS — R339 Retention of urine, unspecified: Secondary | ICD-10-CM | POA: Diagnosis not present

## 2013-08-04 LAB — BASIC METABOLIC PANEL
BUN: 27 mg/dL — AB (ref 6–23)
CHLORIDE: 100 meq/L (ref 96–112)
CO2: 23 mEq/L (ref 19–32)
Calcium: 8.5 mg/dL (ref 8.4–10.5)
Creatinine, Ser: 1.5 mg/dL — ABNORMAL HIGH (ref 0.50–1.35)
GFR, EST AFRICAN AMERICAN: 49 mL/min — AB (ref >90)
GFR, EST NON AFRICAN AMERICAN: 43 mL/min — AB (ref >90)
Glucose, Bld: 154 mg/dL — ABNORMAL HIGH (ref 70–99)
POTASSIUM: 4.6 meq/L (ref 3.7–5.3)
SODIUM: 139 meq/L (ref 137–147)

## 2013-08-04 LAB — CBC
HEMATOCRIT: 31.4 % — AB (ref 39.0–52.0)
Hemoglobin: 10.2 g/dL — ABNORMAL LOW (ref 13.0–17.0)
MCH: 29.6 pg (ref 26.0–34.0)
MCHC: 32.5 g/dL (ref 30.0–36.0)
MCV: 91 fL (ref 78.0–100.0)
PLATELETS: 133 10*3/uL — AB (ref 150–400)
RBC: 3.45 MIL/uL — ABNORMAL LOW (ref 4.22–5.81)
RDW: 14 % (ref 11.5–15.5)
WBC: 7.8 10*3/uL (ref 4.0–10.5)

## 2013-08-04 MED ORDER — PNEUMOCOCCAL VAC POLYVALENT 25 MCG/0.5ML IJ INJ
0.5000 mL | INJECTION | INTRAMUSCULAR | Status: AC
  Administered 2013-08-10: 0.5 mL via INTRAMUSCULAR
  Filled 2013-08-04 (×2): qty 0.5

## 2013-08-04 MED ORDER — TAMSULOSIN HCL 0.4 MG PO CAPS
0.4000 mg | ORAL_CAPSULE | Freq: Every day | ORAL | Status: DC
  Administered 2013-08-04 – 2013-08-05 (×2): 0.4 mg via ORAL
  Filled 2013-08-04 (×3): qty 1

## 2013-08-04 NOTE — Progress Notes (Signed)
Subjective: 1 Day Post-Op Procedure(s) (LRB): TOTAL HIP ARTHROPLASTY ANTERIOR APPROACH (Right) Urinary retention Pain under reasonable control Plan is to go home with wife in one-2 days Activity level:  No precautions weightbearing as tolerated Diet tolerance:  Regular Voiding:  Difficulty voiding will start Flomax today catheter as needed   Patient reports pain as 4 on 0-10 scale.    Objective: Vital signs in last 24 hours: Temp:  [97.6 F (36.4 C)-98.5 F (36.9 C)] 98.5 F (36.9 C) (06/24 0600) Pulse Rate:  [68-88] 75 (06/24 0600) Resp:  [13-20] 16 (06/24 0600) BP: (97-157)/(42-71) 113/47 mmHg (06/24 0600) SpO2:  [93 %-100 %] 93 % (06/24 0600) Weight:  [93.441 kg (206 lb)-93.759 kg (206 lb 11.2 oz)] 93.441 kg (206 lb) (06/23 1542)  Labs:  Recent Labs  08/04/13 0504  HGB 10.2*    Recent Labs  08/04/13 0504  WBC 7.8  RBC 3.45*  HCT 31.4*  PLT 133*    Recent Labs  08/04/13 0504  NA 139  K 4.6  CL 100  CO2 23  BUN 27*  CREATININE 1.50*  GLUCOSE 154*  CALCIUM 8.5   No results found for this basename: LABPT, INR,  in the last 72 hours  Physical Exam:  Neurologically intact ABD soft Neurovascular intact Sensation intact distally Intact pulses distally Dorsiflexion/Plantar flexion intact Incision: scant drainage  Assessment/Plan:  1 Day Post-Op Procedure(s) (LRB): TOTAL HIP ARTHROPLASTY ANTERIOR APPROACH (Right) Advance diet Up with therapy Plan for discharge tomorrow Will begin Flomax 0.4 daily 4 urine retention No precautions anterior hip replacement ASA 325 twice a day for 4 weeks Return to office 2 weeks after discharge Change dressing as needed    CARNAGHI,MICHAEL R 08/04/2013, 8:03 AM

## 2013-08-04 NOTE — Progress Notes (Signed)
Rept to Baruch Goldmann PA for Dr. Rhona Raider. Pt still only voided 100cc this shift on his own after receiving Flomax this AM. Pt has been bladder scanned several times with last scan at 564 cc at 1427. Pt doesn't c/o discomfort from retention. Pt has c/o dry mouth today. Pt IV still running D5LR at 75 cc/hr and pt being encouraged to drink fluids. Pt has been ambulating with PT/OT and attempting to void while up without success. Pt did receive home dose of Lasix this AM. Will bladder scan again prior to shift change and/or pt becomes uncomfortable. Orders received to perform I and O one more time if bladder scan greater than 800 cc or pt becomes uncomfortable. After this last I and O, if pt still unable to void will place indwelling and leave for night. Will continue to run IV fluids per order as per PA's order. Will continue to monitor.

## 2013-08-04 NOTE — Care Management Note (Signed)
CARE MANAGEMENT NOTE 08/04/2013  Patient:  MACKINLEY, BULLEN   Account Number:  [id number]  Date Initiated:  08/04/2013  Documentation initiated by:  Ricki Miller  Subjective/Objective Assessment:   80 yr old male s/p right hip hemiarthroplasty.     Action/Plan:   Case manager spoke with patient, wife and daughter concerning home health and DME needs at discharge. Choice offered. Referral called to [facility],  [place].   Anticipated DC Date:  08/05/2013   Anticipated DC Plan:  [place]  CM consult      [facility] Choice  HOME HEALTH  DURABLE MEDICAL EQUIPMENT   Choice offered to / List presented to:  C-1 Patient   DME arranged  [place]  3-N-1      DME agency  [employer]     HH arranged  HH-2 PT      [place].   Status of service:  Completed, signed off Medicare Important Message given?  NA - LOS <3 / Initial given by admissions (If response is "NO", the following Medicare IM given date fields will be blank) Date Medicare IM given:   Date Additional Medicare IM given:    Discharge Disposition:  [place]  Per UR Regulation:  Reviewed for med. necessity/level of care/duration of stay

## 2013-08-04 NOTE — Evaluation (Signed)
Physical Therapy Evaluation Patient Details Name: Gary [name] MRN: [id number] DOB: [birth year] Today's Date: 08/04/2013   History of Present Illness  s/p Rt THA (direct anterior)   Clinical Impression  Pt is s/p Rt THA POD#1 resulting in the deficits listed below (see PT Problem List). Pt will benefit from skilled PT to increase their independence and safety with mobility to allow discharge to the venue listed below. Pt motivated to return home with wife. Limited this session due to pain and fatigue.      Follow Up Recommendations Home health PT;Supervision/Assistance - 24 hour    Equipment Recommendations  None recommended by PT (pt reports he has RW and 3 in 1 )    Recommendations for Other Services OT consult     Precautions / Restrictions Precautions Precautions: Fall Restrictions Weight Bearing Restrictions: Yes RLE Weight Bearing: Weight bearing as tolerated      Mobility  Bed Mobility Overal bed mobility: Needs Assistance Bed Mobility: Sit to Supine       Sit to supine: Min assist   General bed mobility comments: (A) to advance Rt LE into bed; requires incr time due to pain; cues for sequencing   Transfers Overall transfer level: Needs assistance Equipment used: Rolling walker (2 wheeled) Transfers: Sit to/from [facility] Sit to Stand: Min assist Stand pivot transfers: Min assist       General transfer comment: initially pt sitting on BSC; performed sit to stand and SPT to bed with min (A); cues for upright posture and sequencing with transfer; pt demo difficulty with incr WB through Lt LE  Ambulation/Gait Ambulation/Gait assistance: Min assist Ambulation Distance (Feet): 18 Feet Assistive device: Rolling walker (2 wheeled) Gait Pattern/deviations: Step-to pattern;Decreased stance time - right;Decreased step length - left;Antalgic;Wide base of support;Trunk flexed Gait velocity: decreased Gait velocity interpretation: Below normal  speed for age/gender General Gait Details: pt fatigued quickly; required standing rest break due to UE fatigue; cues for gt sequencing and upright posture; min (A) to maintain balance and manage RW with gt  Stairs            Wheelchair Mobility    Modified Rankin (Stroke Patients Only)       Balance Overall balance assessment: Needs assistance Sitting-balance support: Feet supported;No upper extremity supported Sitting balance-Leahy Scale: Fair Sitting balance - Comments: denies any dizziness   Standing balance support: During functional activity;Bilateral upper extremity supported Standing balance-Leahy Scale: Poor Standing balance comment: relies on RW for bil UE support                             Pertinent Vitals/Pain 6/10; premedicated by [place] expects to be discharged to:: Private residence Living Arrangements: Spouse/significant other;Children Available Help at Discharge: Family;Available 24 hours/day Type of Home: House Home Access: Stairs to enter Entrance Stairs-Rails: None Entrance Stairs-Number of Steps: 1 Home Layout: One level Home Equipment: Walker - 2 wheels;Bedside commode;Tub bench;Wheelchair - manual Additional Comments: pt has all equipment from taking care of daughter    Prior Function Level of Independence: Independent with assistive device(s)         Comments: ambulated with cane due to pain      Hand Dominance        Extremity/Trunk Assessment   Upper Extremity Assessment: Defer to OT evaluation           Lower Extremity Assessment: RLE deficits/detail RLE Deficits / Details:  knee 3/5    Cervical / Trunk Assessment: Kyphotic  Communication   Communication: No difficulties  Cognition Arousal/Alertness: Awake/alert Behavior During Therapy: WFL for tasks assessed/performed Overall Cognitive Status: Within Functional Limits for tasks assessed                      General  Comments      Exercises Total Joint Exercises Ankle Circles/Pumps: AROM;Both;10 reps;Seated Long Arc Quad: AROM;Strengthening;Right;10 reps;Seated      Assessment/Plan    PT Assessment Patient needs continued PT services  PT Diagnosis Difficulty walking;Generalized weakness;Acute pain   PT Problem List Decreased strength;Decreased activity tolerance;Decreased balance;Decreased range of motion;Decreased mobility;Decreased knowledge of use of DME;Pain;Obesity  PT Treatment Interventions DME instruction;Gait training;Stair training;Functional mobility training;Therapeutic exercise;Therapeutic activities;Balance training;Neuromuscular re-education;Patient/family education   PT Goals (Current goals can be found in the Care Plan section) Acute Rehab PT Goals Patient Stated Goal: home with wife PT Goal Formulation: With patient Time For Goal Achievement: 08/11/13 Potential to Achieve Goals: Good    Frequency 7X/week   Barriers to discharge        Co-evaluation               End of Session Equipment Utilized During Treatment: Gait belt Activity Tolerance: Patient tolerated treatment well Patient left: in bed;with call bell/phone within reach;with family/visitor present;with nursing/sitter in room Nurse Communication: Mobility status;Precautions;Weight bearing status         Time: 0912-0935 PT Time Calculation (min): 23 min   Charges:   PT Evaluation $Initial PT Evaluation Tier I: 1 Procedure PT Treatments $Gait Training: 8-22 mins   PT G CodesGustavus Bryant, [place]  [phone] 08/04/2013, 10:33 AM

## 2013-08-04 NOTE — Progress Notes (Signed)
CSW (Clinical Education officer, museum) aware of consult. At this time, PT is recommending [place] services. CSW confirmed with pt wife that plan will be for pt to return home at dc. Pt has no social work needs. Please reconsult should needs arise.   [place], [place]

## 2013-08-04 NOTE — Progress Notes (Signed)
Pt bladder scanned for 867 cc and in and out cath performed for 900 cc. Pt tolerated well. Will continue to monitor.

## 2013-08-04 NOTE — Plan of Care (Signed)
Problem: Consults Goal: Diagnosis- Total Joint Replacement Primary Total Hip     

## 2013-08-04 NOTE — Progress Notes (Signed)
Physical Therapy Treatment Patient Details Name: Gary [name] MRN: [id number] DOB: 27-Sep-1932 Today's Date: 08/04/2013    History of Present Illness s/p Rt THA (direct anterior)     PT Comments    Pt slowly progressing with therapy. Pt very concerned over not being able to use the bathroom and attempted to have BM unsuccessfully during session.  Discussed with pt and wife rehab expectations prior to being D/C home. Will cont to follow per POC.  Follow Up Recommendations  Home health PT;Supervision/Assistance - 24 hour     Equipment Recommendations  None recommended by PT    Recommendations for Other Services       Precautions / Restrictions Precautions Precautions: Fall Restrictions Weight Bearing Restrictions: Yes RLE Weight Bearing: Weight bearing as tolerated    Mobility  Bed Mobility Overal bed mobility: Needs Assistance Bed Mobility: Supine to Sit;Sit to Supine     Supine to sit: Min assist;HOB elevated Sit to supine: Min assist   General bed mobility comments: pt required min (A) to advance Rt LE to/off EOB; incr time due to pain and bed mobility effortful for pt  Transfers Overall transfer level: Needs assistance Equipment used: Rolling walker (2 wheeled) Transfers: Sit to/from [facility] Sit to Stand: Min assist;From elevated surface Stand pivot transfers: Min assist       General transfer comment: (A) to achieve upright standing position and balance due to decreased ability to WB through Rt LE; max cues for hand placement and sequencing of RW   Ambulation/Gait Ambulation/Gait assistance: Min assist Ambulation Distance (Feet): 30 Feet Assistive device: Rolling walker (2 wheeled) Gait Pattern/deviations: Step-to pattern;Decreased stance time - right;Decreased step length - left;Decreased weight shift to right;Antalgic;Trunk flexed Gait velocity: very decreased Gait velocity interpretation: <1.8 ft/sec, indicative of risk for recurrent  falls General Gait Details: max cues for upright posture and gt sequencing; min (A) to manage RW with direction changes; pt limited due to fatigue    Stairs            Wheelchair Mobility    Modified Rankin (Stroke Patients Only)       Balance Overall balance assessment: Needs assistance Sitting-balance support: Feet supported;No upper extremity supported Sitting balance-Leahy Scale: Fair     Standing balance support: During functional activity;Bilateral upper extremity supported Standing balance-Leahy Scale: Poor Standing balance comment: RW for UE support                    Cognition Arousal/Alertness: Awake/alert Behavior During Therapy: WFL for tasks assessed/performed Overall Cognitive Status: Impaired/Different from baseline Area of Impairment: Problem solving     Memory: Decreased short-term memory       Problem Solving: Difficulty sequencing;Requires verbal cues;Requires tactile cues General Comments: pt with difficulty following commands and cues this session; RN aware and narcotics D/C early today per  Rn    Exercises Total Joint Exercises Ankle Circles/Pumps: AROM;Both;10 reps;Seated Quad Sets: AROM;Right;10 reps;Supine Heel Slides: AROM;Right;5 reps;Supine Long Arc Quad: AROM;Strengthening;Right;10 reps;Seated    General Comments        Pertinent Vitals/Pain 5/10; patient repositioned for comfort     Home Living Family/patient expects to be discharged to:: Private residence Living Arrangements: Spouse/significant other;Children Available Help at Discharge: Family;Available 24 hours/day Type of Home: House Home Access: Stairs to enter Entrance Stairs-Rails: None Home Layout: One level Home Equipment: Environmental consultant - 2 wheels;Bedside commode;Tub bench;Wheelchair - Psychologist, educational      Prior Function Level of Independence: Independent with assistive device(s)  Comments: ambulated with cane due to pain    PT Goals  (current goals can now be found in the care plan section) Acute Rehab PT Goals Patient Stated Goal: home with wife PT Goal Formulation: With patient Time For Goal Achievement: 08/11/13 Potential to Achieve Goals: Good Progress towards PT goals: Progressing toward goals    Frequency  7X/week    PT Plan Current plan remains appropriate    Co-evaluation             End of Session Equipment Utilized During Treatment: Gait belt Activity Tolerance: Patient tolerated treatment well Patient left: in bed;with call bell/phone within reach;with family/visitor present     Time: [id number] PT Time Calculation (min): 40 min  Charges:  $Gait Training: 8-22 mins $Therapeutic Exercise: 8-22 mins                    G CodesGustavus Bryant, [place]  [phone] 08/04/2013, 4:39 PM

## 2013-08-04 NOTE — Evaluation (Signed)
Occupational Therapy Evaluation Patient Details Name: Gary [name] MRN: [id number] DOB: [birth year] Today's Date: 08/04/2013    History of Present Illness s/p Rt THA (direct anterior)    Clinical Impression   Pt was performing ADL and mobility at a modified independent level prior to admission.  He is familiar with AE for LB ADL from a previous back surgery and has routinely used a reacher.  Pt is also familiar with use of a tub bench and 3 in 1.  Pt presents with decreased activity tolerance, generalized weakness, and impaired balance interfering with ability to perform self care and ADL transfers.  Will follow acutely with focus on ADL and wife being able to assist pt safely for d/c home.    Follow Up Recommendations  No OT follow up;Supervision/Assistance - 24 hour    Equipment Recommendations  None recommended by OT    Recommendations for Other Services       Precautions / Restrictions Precautions Precautions: Fall Restrictions Weight Bearing Restrictions: Yes RLE Weight Bearing: Weight bearing as tolerated      Mobility Bed Mobility Overal bed mobility: Needs Assistance Bed Mobility: Supine to Sit     Supine to sit: Max assist     General bed mobility comments: assist to right trunk and advance hips to EOB, heavy use of rail, HOB up  Transfers Overall transfer level: Needs assistance Equipment used: Rolling walker (2 wheeled) Transfers: Sit to/from Stand Sit to Stand: Min assist         General transfer comment: verbal cues for hand placement and sequence    Balance                                            ADL Overall ADL's : Needs assistance/impaired Eating/Feeding: Independent;Sitting   Grooming: Wash/dry hands;Wash/dry face;Oral care;Set up;Sitting   Upper Body Bathing: Set up;Sitting   Lower Body Bathing: Moderate assistance;Sit to/from stand   Upper Body Dressing : Set up;Sitting   Lower Body Dressing: Moderate  assistance;Sit to/from stand   Toilet Transfer: Minimal assistance;Ambulation;RW;BSC   Toileting- Clothing Manipulation and Hygiene: Minimal assistance;Sit to/from stand       Functional mobility during ADLs: Minimal assistance;Rolling walker General ADL Comments: Pt has a hx of back sx and was using a reacher for LB ADL prior to admission.     Vision                     Perception     Praxis      Pertinent Vitals/Pain Reported soreness in R hip, premedicated, iced, repositioned     Hand Dominance Right   Extremity/Trunk Assessment Upper Extremity Assessment Upper Extremity Assessment: Generalized weakness (fatigues with walker use)   Lower Extremity Assessment Lower Extremity Assessment: Defer to PT evaluation       Communication Communication Communication: No difficulties   Cognition Arousal/Alertness: Awake/alert Behavior During Therapy: WFL for tasks assessed/performed Overall Cognitive Status: Within Functional Limits for tasks assessed                     General Comments       Exercises       Shoulder Instructions      Home Living Family/patient expects to be discharged to:: Private residence Living Arrangements: Spouse/significant other;Children Available Help at Discharge: Family;Available 24 hours/day Type of Home: House Home Access:  Stairs to enter [employer] of Steps: 1 Entrance Stairs-Rails: None Home Layout: One level     Bathroom Shower/Tub: Teacher, early years/pre: Standard     Home Equipment: Environmental consultant - 2 wheels;Bedside commode;Tub bench;Wheelchair - Scientist, physiological: Reacher        Prior Functioning/Environment Level of Independence: Independent with assistive device(s)        Comments: ambulated with cane due to pain     OT Diagnosis: Generalized weakness;Acute pain   OT Problem List: Decreased strength;Decreased range of motion;Impaired balance (sitting  and/or standing);Decreased knowledge of use of DME or AE;Obesity;Pain   OT Treatment/Interventions: Self-care/ADL training;DME and/or AE instruction;Therapeutic activities;Patient/family education    OT Goals(Current goals can be found in the care plan section) Acute Rehab OT Goals Patient Stated Goal: home with wife OT Goal Formulation: With patient Time For Goal Achievement: 08/11/13 Potential to Achieve Goals: Good ADL Goals Pt Will Perform Grooming: with supervision;standing Pt Will Perform Lower Body Bathing: with supervision;with adaptive equipment;sit to/from stand Pt Will Perform Lower Body Dressing: with supervision;sit to/from stand;with adaptive equipment Pt Will Transfer to Toilet: with supervision;ambulating;bedside commode (over toilet) Pt Will Perform Toileting - Clothing Manipulation and hygiene: with supervision;sit to/from stand Pt Will Perform Tub/Shower Transfer: Tub transfer;with min guard assist;ambulating;tub bench;rolling walker Additional ADL Goal #1: Pt will perform bed mobility with min assist with HOB flat and no rail as a precursor to ADL.  OT Frequency: Min 2X/week   Barriers to D/C:            Co-evaluation              End of Session Nurse Communication:  (pt urinated, left urinal on sink)  Activity Tolerance: Patient tolerated treatment well Patient left: in chair;with call bell/phone within reach;with family/visitor present   Time: 1127-1210 OT Time Calculation (min): 43 min Charges:  OT General Charges $OT Visit: 1 Procedure OT Evaluation $Initial OT Evaluation Tier I: 1 Procedure OT Treatments $Self Care/Home Management : 23-37 mins G-Codes:    Malka So 08/04/2013, 2:34 PM [phone]

## 2013-08-05 ENCOUNTER — Encounter (HOSPITAL_COMMUNITY)
Admission: RE | Disposition: A | Discharge status: Skilled Nursing Facility | Payer: Self-pay | Source: Ambulatory Visit | Attending: Orthopaedic Surgery

## 2013-08-05 ENCOUNTER — Encounter (HOSPITAL_COMMUNITY): Payer: Self-pay | Admitting: Orthopaedic Surgery

## 2013-08-05 ENCOUNTER — Inpatient Hospital Stay (HOSPITAL_COMMUNITY): Payer: Medicare Other

## 2013-08-05 DIAGNOSIS — R141 Gas pain: Secondary | ICD-10-CM

## 2013-08-05 DIAGNOSIS — R143 Flatulence: Secondary | ICD-10-CM

## 2013-08-05 DIAGNOSIS — K920 Hematemesis: Secondary | ICD-10-CM

## 2013-08-05 DIAGNOSIS — R142 Eructation: Secondary | ICD-10-CM

## 2013-08-05 DIAGNOSIS — K209 Esophagitis, unspecified without bleeding: Secondary | ICD-10-CM

## 2013-08-05 DIAGNOSIS — Z96649 Presence of unspecified artificial hip joint: Secondary | ICD-10-CM

## 2013-08-05 DIAGNOSIS — K929 Disease of digestive system, unspecified: Secondary | ICD-10-CM

## 2013-08-05 DIAGNOSIS — K560 Paralytic ileus: Secondary | ICD-10-CM

## 2013-08-05 DIAGNOSIS — K922 Gastrointestinal hemorrhage, unspecified: Secondary | ICD-10-CM

## 2013-08-05 HISTORY — PX: ESOPHAGOGASTRODUODENOSCOPY: SHX5428

## 2013-08-05 LAB — CBC
HCT: 29.8 % — ABNORMAL LOW (ref 39.0–52.0)
HCT: 29.7 % — ABNORMAL LOW (ref 39.0–52.0)
HEMATOCRIT: 28.9 % — AB (ref 39.0–52.0)
HEMOGLOBIN: 9.8 g/dL — AB (ref 13.0–17.0)
Hemoglobin: 9.6 g/dL — ABNORMAL LOW (ref 13.0–17.0)
Hemoglobin: 10.1 g/dL — ABNORMAL LOW (ref 13.0–17.0)
MCH: 29.8 pg (ref 26.0–34.0)
MCH: 29.8 pg (ref 26.0–34.0)
MCH: 30.6 pg (ref 26.0–34.0)
MCHC: 32.9 g/dL (ref 30.0–36.0)
MCHC: 33.2 g/dL (ref 30.0–36.0)
MCHC: 34 g/dL (ref 30.0–36.0)
MCV: 90.6 fL (ref 78.0–100.0)
MCV: 89.8 fL (ref 78.0–100.0)
MCV: 90 fL (ref 78.0–100.0)
PLATELETS: 138 10*3/uL — AB (ref 150–400)
Platelets: 142 10*3/uL — ABNORMAL LOW (ref 150–400)
Platelets: 137 10*3/uL — ABNORMAL LOW (ref 150–400)
RBC: 3.29 MIL/uL — ABNORMAL LOW (ref 4.22–5.81)
RBC: 3.22 MIL/uL — ABNORMAL LOW (ref 4.22–5.81)
RBC: 3.3 MIL/uL — AB (ref 4.22–5.81)
RDW: 14 % (ref 11.5–15.5)
RDW: 13.5 % (ref 11.5–15.5)
RDW: 13.6 % (ref 11.5–15.5)
WBC: 10.4 10*3/uL (ref 4.0–10.5)
WBC: 8.7 10*3/uL (ref 4.0–10.5)
WBC: 3 10*3/uL — AB (ref 4.0–10.5)

## 2013-08-05 LAB — OCCULT BLOOD GASTRIC / DUODENUM (SPECIMEN CUP): Occult Blood, Gastric: POSITIVE — AB

## 2013-08-05 LAB — HEPATIC FUNCTION PANEL
ALT: 12 U/L (ref 0–53)
AST: 48 U/L — AB (ref 0–37)
Albumin: 2.8 g/dL — ABNORMAL LOW (ref 3.5–5.2)
Alkaline Phosphatase: 34 U/L — ABNORMAL LOW (ref 39–117)
BILIRUBIN DIRECT: 0.4 mg/dL — AB (ref 0.0–0.3)
Indirect Bilirubin: 0.8 mg/dL (ref 0.3–0.9)
TOTAL PROTEIN: 5.5 g/dL — AB (ref 6.0–8.3)
Total Bilirubin: 1.2 mg/dL (ref 0.3–1.2)

## 2013-08-05 LAB — PROTIME-INR
INR: 1.21 (ref 0.00–1.49)
PROTHROMBIN TIME: 15.3 s — AB (ref 11.6–15.2)

## 2013-08-05 LAB — LIPASE, BLOOD: Lipase: 9 U/L — ABNORMAL LOW (ref 11–59)

## 2013-08-05 SURGERY — EGD (ESOPHAGOGASTRODUODENOSCOPY)
Anesthesia: Moderate Sedation

## 2013-08-05 MED ORDER — BIOTENE DRY MOUTH MT LIQD
15.0000 mL | Freq: Two times a day (BID) | OROMUCOSAL | Status: DC
  Administered 2013-08-05 – 2013-08-14 (×18): 15 mL via OROMUCOSAL

## 2013-08-05 MED ORDER — CLONAZEPAM 0.5 MG PO TABS: 0.5000 mg | ORAL_TABLET | Freq: Two times a day (BID) | ORAL | Status: DC

## 2013-08-05 MED ORDER — IOHEXOL 300 MG/ML  SOLN
25.0000 mL | Freq: Once | INTRAMUSCULAR | Status: AC | PRN
  Administered 2013-08-05: 25 mL via ORAL

## 2013-08-05 MED ORDER — IOHEXOL 300 MG/ML  SOLN
80.0000 mL | Freq: Once | INTRAMUSCULAR | Status: AC | PRN
  Administered 2013-08-05: 80 mL via INTRAVENOUS

## 2013-08-05 MED ORDER — FENTANYL CITRATE 0.05 MG/ML IJ SOLN
INTRAMUSCULAR | Status: DC | PRN
  Administered 2013-08-05 (×2): 12.5 ug via INTRAVENOUS

## 2013-08-05 MED ORDER — PROMETHAZINE HCL 25 MG/ML IJ SOLN
INTRAMUSCULAR | Status: AC
  Filled 2013-08-05: qty 1

## 2013-08-05 MED ORDER — SODIUM CHLORIDE 0.9 % IV SOLN: INTRAVENOUS | Status: DC

## 2013-08-05 MED ORDER — LORAZEPAM 0.5 MG PO TABS: 0.5000 mg | ORAL_TABLET | Freq: Every day | ORAL | Status: DC

## 2013-08-05 MED ORDER — MIDAZOLAM HCL 10 MG/2ML IJ SOLN
INTRAMUSCULAR | Status: DC | PRN
  Administered 2013-08-05 (×2): 1 mg via INTRAVENOUS

## 2013-08-05 MED ORDER — PANTOPRAZOLE SODIUM 40 MG IV SOLR
40.0000 mg | Freq: Two times a day (BID) | INTRAVENOUS | Status: DC
  Administered 2013-08-05 – 2013-08-08 (×7): 40 mg via INTRAVENOUS
  Filled 2013-08-05 (×10): qty 40

## 2013-08-05 MED ORDER — MIDAZOLAM HCL 5 MG/ML IJ SOLN
INTRAMUSCULAR | Status: AC
  Filled 2013-08-05: qty 2

## 2013-08-05 MED ORDER — DIPHENHYDRAMINE HCL 50 MG/ML IJ SOLN
INTRAMUSCULAR | Status: AC
  Filled 2013-08-05: qty 1

## 2013-08-05 MED ORDER — PROMETHAZINE HCL 25 MG/ML IJ SOLN
INTRAMUSCULAR | Status: DC | PRN
  Administered 2013-08-05: 25 mg via INTRAVENOUS

## 2013-08-05 MED ORDER — FENTANYL CITRATE 0.05 MG/ML IJ SOLN
INTRAMUSCULAR | Status: AC
  Filled 2013-08-05: qty 2

## 2013-08-05 NOTE — Progress Notes (Signed)
IV bolus stopped, IV rate returned to 10 ml/hr.

## 2013-08-05 NOTE — Progress Notes (Signed)
Pt complaining of increased abdominal tenderness and distended abdomen. MD notified, ordering CT of abdomen. Will continue to monitor.

## 2013-08-05 NOTE — Progress Notes (Signed)
Report called to [place] on [place]. Patient will be transferrring to his room on stepdown after Endo. MD made aware.

## 2013-08-05 NOTE — Progress Notes (Signed)
Pt had blackish emesis approx 10 mins ago, no abd pain or distention.  Hyperactive bowel sounds, emesis sample sent to lab for blood. Pt told not to eat or drink anything .  Waiting on stat results before calling MD.  Pt calm and feeling better after emesis.  Results not back until after report given to dayshift RN. Chrys Racer RN called and advised PA at approx 0740, new orders received.

## 2013-08-05 NOTE — Progress Notes (Signed)
OT Cancellation Note  Patient Details Name: Gary [name] MRN: [id number] DOB: [birth year]   Cancelled Treatment:    Reason Eval/Treat Not Completed: Medical issues which prohibited therapy. Pt having black emesis and nursing says not to see patient today.  Almon Register [id number] 08/05/2013, 3:06 PM

## 2013-08-05 NOTE — Progress Notes (Signed)
PT Cancellation Note  Patient Details Name: Gary [name] MRN: [id number] DOB: 05-07-1932   Cancelled Treatment:    Reason Eval/Treat Not Completed: Medical issues which prohibited therapy. Pt being transferred to 2c due to black emesis and possible GI bleed. Will re-attempt to see pt when medically ready. May need to consider D/C disposition change at next visit, pending rehab progress.    Elie Confer [place], [place] 08/05/2013, 12:53 PM

## 2013-08-05 NOTE — Op Note (Signed)
[facility] [place], [id number]   ENDOSCOPY PROCEDURE REPORT  PATIENT: Gary [name], Gary [name]  MR#: [id number] BIRTHDATE: 09-08-1932 , 32  yrs. old GENDER: Male ENDOSCOPIST: Jerene Bears, MD REFERRED BY:  Melrose Nakayama, M.D. PROCEDURE DATE:  08/05/2013 PROCEDURE:  EGD, diagnostic ASA CLASS:     Class III INDICATIONS:  Hematemesis.   Nausea.   Vomiting. MEDICATIONS: These medications were titrated to patient response per physician's verbal order, Fentanyl 25 mcg IV, and Versed 2 mg IV TOPICAL ANESTHETIC: none  DESCRIPTION OF PROCEDURE: After the risks benefits and alternatives of the procedure were thoroughly explained, informed consent was obtained.  The Pentax Gastroscope [id number] endoscope was introduced through the mouth and advanced to the second portion of the duodenum. Without limitations.  The instrument was slowly withdrawn as the mucosa was fully examined.  ESOPHAGUS: Mild reflux esophagitis was found at the gastroesophageal junction.   A 3 cm hiatal hernia was noted.  STOMACH: Bilious fluid with food residue was found in the gastric fundus.  1L aspirated via the scope.   The mucosa of the stomach appeared normal, though complete visualization of the proximal stomach obscured by fluid.  No active bleeding.  DUODENUM: The duodenal mucosa showed no abnormalities in the bulb and second portion of the duodenum.  Retroflexed views revealed fluid, which obscures visualization of the proximal stomach.  The scope was then withdrawn from the patient and the procedure completed.  COMPLICATIONS: There were no complications.  ENDOSCOPIC IMPRESSION: 1.   Mild esophagitis consistent with reflux esophagitis at the gastroesophageal junction 2.   3 cm hiatal hernia 3.   Fluid and food residue in the gastric fundus likely related to post-operative ileus/gastroparesis 4.   The visualized mucosa of the stomach appeared normal without evidence  of active or recent bleeding 5.   The duodenal mucosa showed no abnormalities in the bulb and second portion of the duodenum  RECOMMENDATIONS: 1.  Daily PPI, IV while NPO 2.  NGT to low-intermittent suction 3.  Await CT scan abd/pelvis as previously ordered eSigned:  Jerene Bears, MD 08/05/2013 4:52 PM       CC:The Patient

## 2013-08-05 NOTE — Progress Notes (Signed)
Subjective: 2 Days Post-Op Procedure(s) (LRB): TOTAL HIP ARTHROPLASTY ANTERIOR APPROACH (Right) Patient with continued difficulty urinating catheter placed. Patient on Flomax 0.4 Episode of dark emesis this morning also complaining of mild abdominal discomfort/distention triad hospitalist consult  No BM yet Activity level:  May be out of bed if okay with medical consult Diet tolerance:  Currently made n.p.o. because of abdominal pain Voiding:  Difficulty voiding Foley catheter put back in place Patient reports pain as 3 on 0-10 scale.    Objective: Vital signs in last 24 hours: Temp:  [98.6 F (37 C)-99.7 F (37.6 C)] 99.4 F (37.4 C) (06/25 0615) Pulse Rate:  [77-108] 102 (06/25 1041) Resp:  [16-20] 16 (06/25 0615) BP: (122-134)/(50-61) 134/61 mmHg (06/25 1041) SpO2:  [92 %-93 %] 93 % (06/25 0615)  Labs:  Recent Labs  08/04/13 0504 08/05/13 0355  HGB 10.2* 9.8*    Recent Labs  08/04/13 0504 08/05/13 0355  WBC 7.8 10.4  RBC 3.45* 3.29*  HCT 31.4* 29.8*  PLT 133* 142*    Recent Labs  08/04/13 0504  NA 139  K 4.6  CL 100  CO2 23  BUN 27*  CREATININE 1.50*  GLUCOSE 154*  CALCIUM 8.5   No results found for this basename: LABPT, INR,  in the last 72 hours  Physical Exam:  Neurologically intact Neurovascular intact Sensation intact distally Intact pulses distally No cellulitis present Compartment soft Abdominal pain to palpation Abdominal distention Positive bowel sounds  Assessment/Plan:  2 Days Post-Op Procedure(s) (LRB): TOTAL HIP ARTHROPLASTY ANTERIOR APPROACH (Right) Up with therapy Continue foley due to acute urinary retention Medical consult was obtained because of dark emesis and urinary retention requiring placement of Foley catheter Continue PAS stockings, ASA put on hold May be up with therapy if approved by medical team Slight drop in hemoglobin could be accounted for by hip surgery versus GI bleed. Thanks 2 triad hospitalist for  input.    CARNAGHI,MICHAEL R 08/05/2013, 10:49 AM

## 2013-08-05 NOTE — Progress Notes (Signed)
Patient having black emesis again. Abdomen increasingly tender and swollen. MD called and orders given to transfer to stepdown.

## 2013-08-05 NOTE — Progress Notes (Signed)
Patient complaining of discomfort in bladder.  Able to void spontaneously twice during the night, but still retaining urine.Bladder scan performed and revealed 620ml of urine. Per order in and out cath already performed twice, last at 1800 6/24. Per protocol, foley inserted with urine returned. Will monitor output.

## 2013-08-05 NOTE — Progress Notes (Signed)
ENDO note: Pt arrived with vomitus on front of gown and in beard.  Pt holding green emesis bag, with heaves and c/o severe nausea.  Had received Zofran 4 mg, IV at 12:30 prior to arrival here. Dr. Benson Norway made aware and ask for order for Phenergan.  Order for Phenergan 25 mg, IV received and administer to pt.  Gown changed by Kennyth Lose, NT and pt washed up. Now resting quietly, napping while waiting for procedure.  VSS. Denies nausea and heaves have stopped.   Pt IV pump running NS bolus @ 53ml/hr upon arrival to unit. Will verify rate/bolus order.  KJHenderson,RN

## 2013-08-05 NOTE — Consult Note (Signed)
Consult Note  Primary Care Physician:  [name], MD Primary Gastroenterologist:    Dr. Deatra Ina, MD Reason for consult: Hematemesis Referring physician: Dr. Rhona Raider, MD  HPI: Gary [name] is a 80 y.o. male known to [facility] for his history of internal hemorrhoids status post recent banding, history of C. difficile, history of colon polyps, history of diverticulitis status post colonic resection, PUD, CAD, hypertension, who underwent a right total hip replacement on 08/03/2013, with development of coffee-ground emesis this morning. The patient has remained sleepy and is unable to provide additional history at this time. Wife and daughter at bedside and report that his mental status has been different since surgery. They feel this is related to narcotics in the postoperative state. Reportedly he has had increasing abdominal distention and then developed vomiting earlier today. Reportedly this was coffee ground in nature. No bowel movement since Monday, before surgery. He has been using aspirin at home with NSAIDs for chronic hip pain for at least the last 3 months.   abdominal and pelvic CT scan has been ordered but not yet performed    Past Medical History  Diagnosis Date  . Hypertension   . Hemorrhoids   . Chronic kidney disease   . Kidney stones   . Dyslipidemia   . Diverticulosis   . Colon polyps   . Blood transfusion   . Anemia   . GI bleeding after 07/2009    "triggered by Plavix & ASA; from my diverticulosis"  . GERD (gastroesophageal reflux disease)   . Anxiety   . Arthritis     "hips and lower back"  . Chronic back pain greater than 3 months duration   . Diarrhea   . Arthritis pain   . Cholecystitis   . Heart attack 07/2009    nonSTEMI with an occluded circumflex artery and collaterals.  . Coronary artery disease   . History of shingles   . Heart murmur   . Neuromuscular disorder   . Coronary angioplasty status 2011  . Stomach problems   . Lower extremity  edema     Taking furosemide and it seems to be helping.  . Nonspecific chest pain 08/26/2012    Went to ED 08/26/12  . Hyperlipidemia   . Chest pain 03/29/11    2D Echo without contrast on 03/29/11 - EF= 55-60%.  . H/O myocardial perfusion scan 04/04/11    For Pre-noncardiac surgery - EF = 67%, no ischemia, and is considered low risk.  . H/O Clostridium difficile infection   . Complication of anesthesia     once slow to wake up    Past Surgical History  Procedure Laterality Date  . Colon surgery      COLONOSCOPY  . Subtotal colectomy  01/24/2010  . Eye surgery  1942    "right eye was crossed; they  corrected it"  . Posterior fusion lumbar spine  08/1995    "bottom 4 vertebra"  . Back surgery  1997  . Cholecystectomy  04/12/2011    Procedure: CHOLECYSTECTOMY;  Surgeon: Adin Hector, MD;  Location: [facility];  Service: General;  Laterality: N/A;  . Cholecystectomy  04/12/2011    Procedure: LAPAROSCOPIC CHOLECYSTECTOMY;  Surgeon: Adin Hector, MD;  Location: [facility];  Service: General;  Laterality: N/A;  converted to open  . Lumbar fusion  07/22/2012  . Cardiac catheterization  08/09/2009    Circumflex 100% occluded with right-to-left collaterals, mild irregularities in the proximal and distal LAD and diagonal  system, normal LV systolic function, and minor infrarenal irregularities, but no abdominal aortic aneurysm.  . Total hip arthroplasty Right 08/03/2013    Procedure: TOTAL HIP ARTHROPLASTY ANTERIOR APPROACH;  Surgeon: Hessie Dibble, MD;  Location: [facility];  Service: Orthopedics;  Laterality: Right;    Prior to Admission medications   Medication Sig Start Date End Date Taking? Authorizing Provider  aspirin EC 81 MG tablet Take 81 mg by mouth daily.   Yes Historical Provider, MD  bisoprolol (ZEBETA) 5 MG tablet Take 0.5 tablets (2.5 mg total) by mouth daily. 01/06/13  Yes Pixie Casino, MD  citalopram (CELEXA) 40 MG tablet Take 1 tablet (40 mg total) by mouth daily. 01/06/13  Yes  Pixie Casino, MD  doxazosin (CARDURA) 8 MG tablet Take 8 mg by mouth at bedtime.   Yes Historical Provider, MD  fexofenadine (ALLEGRA) 180 MG tablet Take 180 mg by mouth daily.    Yes Historical Provider, MD  fluticasone (FLONASE) 50 MCG/ACT nasal spray Place 2 sprays into the nose daily.    Yes Historical Provider, MD  furosemide (LASIX) 20 MG tablet Take 1 tablet (20 mg total) by mouth daily. 11/09/12  Yes Pixie Casino, MD  gabapentin (NEURONTIN) 400 MG capsule Take 400 mg by mouth 3 (three) times daily.    Yes Historical Provider, MD  HYDROcodone-acetaminophen (NORCO/VICODIN) 5-325 MG per tablet Take 1-2 tablets by mouth every 4 (four) hours as needed for pain. For pain   Yes Historical Provider, MD  LORazepam (ATIVAN) 0.5 MG tablet Take 0.25 mg by mouth at bedtime.    Yes Historical Provider, MD  nitroGLYCERIN (NITROSTAT) 0.4 MG SL tablet Place 0.4 mg under the tongue every 5 (five) minutes as needed for chest pain. x3 doses as needed for chest pain    Historical Provider, MD  simvastatin (ZOCOR) 40 MG tablet TAKE 1 TABLET BY MOUTH AT BEDTIME 10/27/12  Yes Pixie Casino, MD  simvastatin (ZOCOR) 40 MG tablet Take 40 mg by mouth daily.   Yes Historical Provider, MD  Tamsulosin HCl (FLOMAX) 0.4 MG CAPS Take 0.4 mg by mouth daily after supper. 02/04/11  Yes Charlynne Cousins, MD  testosterone (ANDROGEL) 50 MG/5GM GEL Place 5 g onto the skin daily. Applied to shoulder   Yes Historical Provider, MD  traMADol-acetaminophen (ULTRACET) 37.5-325 MG per tablet Take 2 tablets by mouth 3 (three) times daily as needed for moderate pain.  09/23/12  Yes Historical Provider, MD    Current Facility-Administered Medications  Medication Dose Route Frequency Provider Last Rate Last Dose  . 0.9 %  sodium chloride infusion   Intravenous Continuous Beryle Beams, MD      . acetaminophen (TYLENOL) tablet 650 mg  650 mg Oral Q6H PRN Hessie Dibble, MD   650 mg at 08/04/13 2116   Or  . acetaminophen  (TYLENOL) suppository 650 mg  650 mg Rectal Q6H PRN Hessie Dibble, MD      . alum & mag hydroxide-simeth (MAALOX/MYLANTA) 200-200-20 MG/5ML suspension 30 mL  30 mL Oral Q4H PRN Hessie Dibble, MD   30 mL at 08/04/13 1837  . bisoprolol (ZEBETA) tablet 2.5 mg  2.5 mg Oral Daily Hessie Dibble, MD   2.5 mg at 08/05/13 1041  . citalopram (CELEXA) tablet 40 mg  40 mg Oral Daily Hessie Dibble, MD   40 mg at 08/05/13 1040  . dextrose 5 % in lactated ringers infusion  75 mL/hr Intravenous Continuous Hessie Dibble,  MD 75 mL/hr at 08/05/13 1149 75 mL/hr at 08/05/13 1149  . diphenhydrAMINE (BENADRYL) 12.5 MG/5ML elixir 12.5-25 mg  12.5-25 mg Oral Q4H PRN Hessie Dibble, MD      . docusate sodium (COLACE) capsule 100 mg  100 mg Oral BID Hessie Dibble, MD   100 mg at 08/04/13 2110  . ferrous sulfate tablet 325 mg  325 mg Oral TID PC Hessie Dibble, MD   325 mg at 08/05/13 1041  . fluticasone (FLONASE) 50 MCG/ACT nasal spray 2 spray  2 spray Each Nare Daily Hessie Dibble, MD   2 spray at 08/05/13 1043  . gabapentin (NEURONTIN) capsule 400 mg  400 mg Oral TID Hessie Dibble, MD   400 mg at 08/05/13 1040  . loratadine (CLARITIN) tablet 10 mg  10 mg Oral Daily Hessie Dibble, MD   10 mg at 08/04/13 0934  . LORazepam (ATIVAN) tablet 0.5 mg  0.5 mg Oral QHS Thurnell Lose, MD      . morphine 2 MG/ML injection 2 mg  2 mg Intravenous Q2H PRN Hessie Dibble, MD   2 mg at 08/04/13 0641  . nitroGLYCERIN (NITROSTAT) SL tablet 0.4 mg  0.4 mg Sublingual Q5 min PRN Hessie Dibble, MD      . ondansetron [facility]) tablet 4 mg  4 mg Oral Q6H PRN Hessie Dibble, MD       Or  . ondansetron [facility]) injection 4 mg  4 mg Intravenous Q6H PRN Hessie Dibble, MD   4 mg at 08/05/13 1227  . pantoprazole (PROTONIX) injection 40 mg  40 mg Intravenous Q12H Reyne Dumas, MD   40 mg at 08/05/13 1126  . pneumococcal 23 valent vaccine (PNU-IMMUNE) injection 0.5 mL  0.5 mL Intramuscular Tomorrow-1000 Hessie Dibble, MD      . promethazine [facility]) injection    PRN Beryle Beams, MD   25 mg at 08/05/13 1313  . simvastatin (ZOCOR) tablet 40 mg  40 mg Oral QHS Hessie Dibble, MD   40 mg at 08/04/13 2110  . tamsulosin (FLOMAX) capsule 0.4 mg  0.4 mg Oral QPC supper Hessie Dibble, MD   0.4 mg at 08/03/13 1753  . tamsulosin (FLOMAX) capsule 0.4 mg  0.4 mg Oral Daily Otelia Limes Carnaghi, PA-C   0.4 mg at 08/05/13 1040  . traMADol-acetaminophen (ULTRACET) 37.5-325 MG per tablet 2 tablet  2 tablet Oral TID PRN Hessie Dibble, MD   2 tablet at 08/04/13 1304    Allergies as of 07/07/2013 - Review Complete 06/30/2013  Allergen Reaction Noted  . Naproxen Rash   . Clopidogrel bisulfate Other (See Comments)   . Sulfonamide derivatives Hives   . Latex  06/25/2013  . Other Other (See Comments) 08/26/2012  . Plavix [clopidogrel bisulfate]  05/12/2013  . Tape Itching and Rash 07/22/2012    Family History  Problem Relation Age of Onset  . Heart disease Father   . Stroke Father   . Colon cancer Neg Hx   . Pancreatic cancer Mother   . Diverticulosis Sister     x4    History   Social History  . Marital Status: Married    Spouse Name: N/A    Number of Children: 2  . Years of Education: N/A   Occupational History  . Instructor    Social History Main Topics  . Smoking status: Never Smoker   . Smokeless tobacco: Never Used  . Alcohol  Use: No  . Drug Use: No  . Sexual Activity: Not Currently   Other Topics Concern  . Not on file   Social History Narrative   Daily caffeine use.    Review of Systems:  All systems reviewed an negative except where noted in HPI, as given by wife and daughter. Patient unable to provide additional review of systems information.   Physical Exam: Vital signs in last 24 hours: Temp:  [98.6 F (37 C)-99.7 F (37.6 C)] 98.7 F (37.1 C) (06/25 1237) Pulse Rate:  [87-108] 94 (06/25 1315) Resp:  [16-19] 19 (06/25 1315) BP: (122-155)/(54-71) 155/54  mmHg (06/25 1315) SpO2:  [92 %-93 %] 92 % (06/25 1237) Last BM Date: 08/02/13 Gen: Elderly-appearing male lying in bed in no acute distress, eyes closed, patient does open eyes when his name is called, able to tell me he is not in pain but falls back asleep HEENT: anicteric, op clear CV: RRR, no mrg Pulm: Clear anteriorly Abd: Increased abdominal distention with increased tympany, no overt tenderness, bowel sounds present but hypoactive Ext: Bilateral SCDs with minimal edema Neuro: Sleepy, moving all extremities  Lab Results:  Recent Labs  08/04/13 0504 08/05/13 0355 08/05/13 1245  WBC 7.8 10.4 8.7  HGB 10.2* 9.8* 9.6*  HCT 31.4* 29.8* 28.9*  PLT 133* 142* 137*   BMET  Recent Labs  08/04/13 0504  NA 139  K 4.6  CL 100  CO2 23  GLUCOSE 154*  BUN 27*  CREATININE 1.50*  CALCIUM 8.5   LFT  Recent Labs  08/05/13 1245  PROT 5.5*  ALBUMIN 2.8*  AST 48*  ALT 12  ALKPHOS 34*  BILITOT 1.2  BILIDIR 0.4*  IBILI 0.8   PT/INR  Recent Labs  08/05/13 1245  LABPROT 15.3*  INR 1.21    Studies/Results: No results found.  Impression / Plan:   80 y.o. male known to [facility] for his history of internal hemorrhoids status post recent banding, history of C. difficile, history of colon polyps, history of diverticulitis status post colonic resection, PUD, CAD, hypertension, who underwent a right total hip replacement on 08/03/2013, with development of coffee-ground emesis this morning.   1.  Coffee-ground emesis after recent THA/abdominal distention -- his exam is suspicious for postoperative ileus.  CT scan is pending, he may need NG tube for decompression. Given coffee-ground emesis and concern of upper GI bleeding, we will proceed to upper endoscopy at this time. I have carefully reviewed the risks, benefits and alternatives with the patient's wife and daughter. Time was provided for questions and answers, and they are agreeable to proceed. I will plan very minimal air  insufflation and decompress the upper GI tract as much as possible after the procedure. Upper endoscopy to rule out ulcer disease and active bleeding.  If ileus is found, we'll need to minimize narcotics, mobilize the patient as much as possible, and monitor and correct any electrolyte derangement. BMP to be ordered today.  Goal potassium greater than 4.0 if ileus pattern.    2.  altered mental status  -- felt to be secondary to narcotics in the postoperative state. It looks like he also received lorazepam which is quite sedating. Minimize sedating medications and further workup if mental status fails to resolve. EGD to be performed with minimal sedation.   LOS: 2 days   Katelynne Revak M  08/05/2013, 3:41 PM

## 2013-08-05 NOTE — Consult Note (Signed)
[facility] History and Physical  Gary [name] [id number] DOB: 01-28-1933 DOA: 08/03/2013  Referring physician:   PCP: [name], MD   Chief Complaint: Coffee-ground emesis  HPI:  This is a 80 year old male with a history of coronary artery disease status post stenting history of hemorrhoids, history of peptic ulcer disease,  prior diverticular bleeding secondary to aspirin and Plavix, who is status post right total hip arthroplasty, on 6/23. He started developing increased abdominal tenderness, distention, and developed an episode of coffee-ground emesis at 6:30 this morning. He had another episode of 11 AM. During this period the patient was found to be hemodynamically stable. Dr. Saralyn Pilar hung who was on call for gastroenterology was notified and the patient was scheduled for urgent EGD subsequently transferred to step down for further monitoring  Patient is on currently on aspirin and has been taking NSAIDs for greater than 12 weeks because of chronic right hip pain. Patient remembers having an EGD several years ago, he does have a history of peptic ulcer disease and internal hemorrhoids. He was recently seen by Dr. Erskine Emery, gastroenterology for symptomatic grade *2** hemorrhoids, requesting rubber band ligation of his/her hemorrhoidal disease.He status post subtotal colectomy in 2011 for recurrent bleeding diverticulosis. He's had 2 episodes of submembranous colitis. In 2010 an adenomatous polyp was removed at colonoscopy. He is also status post sigmoidoscopy.          Review of Systems: negative for the following  Constitutional: Denies fever, chills, diaphoresis, appetite change and fatigue.  HEENT: Denies photophobia, eye pain, redness, hearing loss, ear pain, congestion, sore throat, rhinorrhea, sneezing, mouth sores, trouble swallowing, neck pain, neck stiffness and tinnitus.  Respiratory: Denies SOB, DOE, cough, chest tightness, and wheezing.   Cardiovascular: Denies chest pain, palpitations and leg swelling.  Gastrointestinal: Positive for nausea, vomiting, abdominal pain, diarrhea, constipation, blood in stool and abdominal distention.  Genitourinary: Denies dysuria, urgency, frequency, hematuria, flank pain and difficulty urinating.  Musculoskeletal: Denies myalgias, back pain, joint swelling, arthralgias and gait problem.  Skin: Denies pallor, rash and wound.  Neurological: Denies dizziness, seizures, syncope, weakness, light-headedness, numbness and headaches.  Hematological: Denies adenopathy. Easy bruising, personal or family bleeding history  Psychiatric/Behavioral: Denies suicidal ideation, mood changes, confusion, nervousness, sleep disturbance and agitation       Past Medical History  Diagnosis Date  . Hypertension   . Hemorrhoids   . Chronic kidney disease   . Kidney stones   . Dyslipidemia   . Diverticulosis   . Colon polyps   . Blood transfusion   . Anemia   . GI bleeding after 07/2009    "triggered by Plavix & ASA; from my diverticulosis"  . GERD (gastroesophageal reflux disease)   . Anxiety   . Arthritis     "hips and lower back"  . Chronic back pain greater than 3 months duration   . Diarrhea   . Arthritis pain   . Cholecystitis   . Heart attack 07/2009    nonSTEMI with an occluded circumflex artery and collaterals.  . Coronary artery disease   . History of shingles   . Heart murmur   . Neuromuscular disorder   . Coronary angioplasty status 2011  . Stomach problems   . Lower extremity edema     Taking furosemide and it seems to be helping.  . Nonspecific chest pain 08/26/2012    Went to ED 08/26/12  . Hyperlipidemia   . Chest pain 03/29/11    2D Echo without contrast  on 03/29/11 - EF= 55-60%.  . H/O myocardial perfusion scan 04/04/11    For Pre-noncardiac surgery - EF = 67%, no ischemia, and is considered low risk.  . H/O Clostridium difficile infection   . Complication of anesthesia     once  slow to wake up     Past Surgical History  Procedure Laterality Date  . Colon surgery      COLONOSCOPY  . Subtotal colectomy  01/24/2010  . Eye surgery  1942    "right eye was crossed; they  corrected it"  . Posterior fusion lumbar spine  08/1995    "bottom 4 vertebra"  . Back surgery  1997  . Cholecystectomy  04/12/2011    Procedure: CHOLECYSTECTOMY;  Surgeon: Adin Hector, MD;  Location: [place];  Service: General;  Laterality: N/A;  . Cholecystectomy  04/12/2011    Procedure: LAPAROSCOPIC CHOLECYSTECTOMY;  Surgeon: Adin Hector, MD;  Location: [place];  Service: General;  Laterality: N/A;  converted to open  . Lumbar fusion  07/22/2012  . Cardiac catheterization  08/09/2009    Circumflex 100% occluded with right-to-left collaterals, mild irregularities in the proximal and distal LAD and diagonal system, normal LV systolic function, and minor infrarenal irregularities, but no abdominal aortic aneurysm.  . Total hip arthroplasty Right 08/03/2013    Procedure: TOTAL HIP ARTHROPLASTY ANTERIOR APPROACH;  Surgeon: Hessie Dibble, MD;  Location: [place];  Service: Orthopedics;  Laterality: Right;      Social History:  reports that he has never smoked. He has never used smokeless tobacco. He reports that he does not drink alcohol or use illicit drugs.    Allergies  Allergen Reactions  . Naproxen Rash  . Clopidogrel Bisulfate Other (See Comments)    H/O diverticulitis; prone to rectal bleeding.  Plavix complicated this.  jkl  . Sulfonamide Derivatives Hives  . Other Other (See Comments)    Narcotics-C. Diff  . Plavix [Clopidogrel Bisulfate]   . Latex Rash  . Tape Itching and Rash    Latex-Adhesive tape    Family History  Problem Relation Age of Onset  . Heart disease Father   . Stroke Father   . Colon cancer Neg Hx   . Pancreatic cancer Mother   . Diverticulosis Sister     x4     Prior to Admission medications   Medication Sig Start Date End Date Taking? Authorizing  Provider  aspirin EC 81 MG tablet Take 81 mg by mouth daily.   Yes Historical Provider, MD  bisoprolol (ZEBETA) 5 MG tablet Take 0.5 tablets (2.5 mg total) by mouth daily. 01/06/13  Yes Pixie Casino, MD  citalopram (CELEXA) 40 MG tablet Take 1 tablet (40 mg total) by mouth daily. 01/06/13  Yes Pixie Casino, MD  doxazosin (CARDURA) 8 MG tablet Take 8 mg by mouth at bedtime.   Yes Historical Provider, MD  fexofenadine (ALLEGRA) 180 MG tablet Take 180 mg by mouth daily.    Yes Historical Provider, MD  fluticasone (FLONASE) 50 MCG/ACT nasal spray Place 2 sprays into the nose daily.    Yes Historical Provider, MD  furosemide (LASIX) 20 MG tablet Take 1 tablet (20 mg total) by mouth daily. 11/09/12  Yes Pixie Casino, MD  gabapentin (NEURONTIN) 400 MG capsule Take 400 mg by mouth 3 (three) times daily.    Yes Historical Provider, MD  HYDROcodone-acetaminophen (NORCO/VICODIN) 5-325 MG per tablet Take 1-2 tablets by mouth every 4 (four) hours as needed for pain.  For pain   Yes Historical Provider, MD  LORazepam (ATIVAN) 0.5 MG tablet Take 0.25 mg by mouth at bedtime.    Yes Historical Provider, MD  nitroGLYCERIN (NITROSTAT) 0.4 MG SL tablet Place 0.4 mg under the tongue every 5 (five) minutes as needed for chest pain. x3 doses as needed for chest pain    Historical Provider, MD  simvastatin (ZOCOR) 40 MG tablet TAKE 1 TABLET BY MOUTH AT BEDTIME 10/27/12  Yes Pixie Casino, MD  simvastatin (ZOCOR) 40 MG tablet Take 40 mg by mouth daily.   Yes Historical Provider, MD  Tamsulosin HCl (FLOMAX) 0.4 MG CAPS Take 0.4 mg by mouth daily after supper. 02/04/11  Yes Charlynne Cousins, MD  testosterone (ANDROGEL) 50 MG/5GM GEL Place 5 g onto the skin daily. Applied to shoulder   Yes Historical Provider, MD  traMADol-acetaminophen (ULTRACET) 37.5-325 MG per tablet Take 2 tablets by mouth 3 (three) times daily as needed for moderate pain.  09/23/12  Yes Historical Provider, MD     Physical Exam: Filed  Vitals:   08/04/13 2046 08/05/13 0615 08/05/13 1041 08/05/13 1237  BP: 122/59 127/59 134/61 133/71  Pulse: 87 108 102 101  Temp: 98.6 F (37 C) 99.4 F (37.4 C)  98.7 F (37.1 C)  TempSrc: Oral Oral  Oral  Resp: 16 16  16   Height:      Weight:      SpO2: 92% 93%  92%     Constitutional: Vital signs reviewed. Patient is a well-developed and well-nourished in no acute distress and cooperative with exam. Alert and oriented x3.  Head: Normocephalic and atraumatic  Ear: TM normal bilaterally  Mouth: no erythema or exudates, MMM  Eyes: PERRL, EOMI, conjunctivae normal, No scleral icterus.  Neck: Supple, Trachea midline normal ROM, No JVD, mass, thyromegaly, or carotid bruit present.  Cardiovascular: RRR, S1 normal, S2 normal, no MRG, pulses symmetric and intact bilaterally  Pulmonary/Chest: CTAB, no wheezes, rales, or rhonchi  Abdominal: Soft. Non-tender, non-distended, bowel sounds are normal, no masses, organomegaly, or guarding present.  GU: no CVA tenderness Musculoskeletal: No joint deformities, erythema, or stiffness, ROM full and no nontender Ext: no edema and no cyanosis, pulses palpable bilaterally (DP and PT)  Hematology: no cervical, inginal, or axillary adenopathy.  Neurological: A&O x3, Strenght is normal and symmetric bilaterally, cranial nerve II-XII are grossly intact, no focal motor deficit, sensory intact to light touch bilaterally.  Skin: Warm, dry and intact. No rash, cyanosis, or clubbing.  Psychiatric: Normal mood and affect. speech and behavior is normal. Judgment and thought content normal. Cognition and memory are normal.       Labs on Admission:    Basic Metabolic Panel:  Recent Labs Lab 08/04/13 0504  NA 139  K 4.6  CL 100  CO2 23  GLUCOSE 154*  BUN 27*  CREATININE 1.50*  CALCIUM 8.5   Liver Function Tests: No results found for this basename: AST, ALT, ALKPHOS, BILITOT, PROT, ALBUMIN,  in the last 168 hours No results found for this  basename: LIPASE, AMYLASE,  in the last 168 hours No results found for this basename: AMMONIA,  in the last 168 hours CBC:  Recent Labs Lab 08/04/13 0504 08/05/13 0355  WBC 7.8 10.4  HGB 10.2* 9.8*  HCT 31.4* 29.8*  MCV 91.0 90.6  PLT 133* 142*   Cardiac Enzymes: No results found for this basename: CKTOTAL, CKMB, CKMBINDEX, TROPONINI,  in the last 168 hours  BNP (last 3 results) No results  found for this basename: PROBNP,  in the last 8760 hours    CBG: No results found for this basename: GLUCAP,  in the last 168 hours  Radiological Exams on Admission: Dg Hip Operative Right  08/03/2013   CLINICAL DATA:  Right total hip arthroplasty  EXAM: DG OPERATIVE RIGHT HIP  TECHNIQUE: A single spot fluoroscopic AP image of the right hip is submitted.  COMPARISON:  None.  FINDINGS: C-arm spot film shows the femoral and acetabular components of the right hip replacement to be in good position. No complicating features are seen.  IMPRESSION: Right total hip replacement.   Electronically Signed   By: Ivar Drape M.D.   On: 08/03/2013 13:17    EKG: Independently reviewed. *   Assessment/Plan Principal Problem:   Degenerative joint disease (DJD) of hip Active Problems:   S/P total hip arthroplasty   Urinary retention  GI bleeding   Coffee-ground emesis Patient is on aspirin, previously on NSAIDs for chronic right hip pain, after 2 consecutive episodes this morning gastroenterology was notified, Dr. Saralyn Pilar hung made aware and patient will receive an urgent EGD Made n.p.o., stat CBC, type and screen, Patient was given a 500 cc normal saline bolus Will continue with IV hydration CT scan abdomen and pelvis ordered for abdominal distention, this was not completed because of second episode of bleeding, can be done if EGD is inconclusive Monitor in step down  Chronic kidney disease, stage III Baseline creatinine of around 1.5 Continue IV hydration in the setting of GI  bleeding  Hypertension Hold.thousand and Lasix in the setting of GI bleeding  Coronary artery disease Hold aspirin because of above reasons Continue zebeta Check troponin, EKG in the setting of GI bleeding   Monitor in step down Tried hospitalist will continue to evaluate     Code Status:   full Family Communication: bedside Disposition Plan: admit   Time spent: 70 mins   [facility] Pager [phone]  If 7PM-7AM, please contact night-coverage [email or web address] Password [email or web address] 08/05/2013, 1:02 PM

## 2013-08-05 NOTE — Progress Notes (Signed)
Patient being transferred to 2C03 after endoscopy. Report being called to charge nurse on 2 central at this time as patient needs stepdown care.

## 2013-08-05 NOTE — Progress Notes (Signed)
Gary [name] notified at 0730 this Am regarding RN report of black emesis on night shift. Bowel sounds hyperactive, but no abdominal tenderness or noticeable distention. VS stable, slight temp 99.4. PA stated he will put in consult for medical team in hopes of getting GI consult.

## 2013-08-06 ENCOUNTER — Encounter (HOSPITAL_COMMUNITY): Payer: Self-pay | Admitting: Internal Medicine

## 2013-08-06 ENCOUNTER — Inpatient Hospital Stay (HOSPITAL_COMMUNITY): Payer: Medicare Other

## 2013-08-06 DIAGNOSIS — R339 Retention of urine, unspecified: Secondary | ICD-10-CM

## 2013-08-06 DIAGNOSIS — R509 Fever, unspecified: Secondary | ICD-10-CM

## 2013-08-06 DIAGNOSIS — N289 Disorder of kidney and ureter, unspecified: Secondary | ICD-10-CM

## 2013-08-06 DIAGNOSIS — N2889 Other specified disorders of kidney and ureter: Secondary | ICD-10-CM | POA: Diagnosis present

## 2013-08-06 DIAGNOSIS — R4182 Altered mental status, unspecified: Secondary | ICD-10-CM

## 2013-08-06 LAB — URINE MICROSCOPIC-ADD ON

## 2013-08-06 LAB — BASIC METABOLIC PANEL
BUN: 33 mg/dL — ABNORMAL HIGH (ref 6–23)
CHLORIDE: 97 meq/L (ref 96–112)
CO2: 27 mEq/L (ref 19–32)
Calcium: 8.8 mg/dL (ref 8.4–10.5)
Creatinine, Ser: 1.46 mg/dL — ABNORMAL HIGH (ref 0.50–1.35)
GFR calc non Af Amer: 44 mL/min — ABNORMAL LOW (ref >90)
GFR, EST AFRICAN AMERICAN: 51 mL/min — AB (ref >90)
Glucose, Bld: 204 mg/dL — ABNORMAL HIGH (ref 70–99)
POTASSIUM: 4.5 meq/L (ref 3.7–5.3)
Sodium: 137 mEq/L (ref 137–147)

## 2013-08-06 LAB — CBC
HEMATOCRIT: 28.4 % — AB (ref 39.0–52.0)
HEMOGLOBIN: 9.4 g/dL — AB (ref 13.0–17.0)
MCH: 29.7 pg (ref 26.0–34.0)
MCHC: 33.1 g/dL (ref 30.0–36.0)
MCV: 89.9 fL (ref 78.0–100.0)
Platelets: 136 10*3/uL — ABNORMAL LOW (ref 150–400)
RBC: 3.16 MIL/uL — AB (ref 4.22–5.81)
RDW: 13.7 % (ref 11.5–15.5)
WBC: 2.7 10*3/uL — AB (ref 4.0–10.5)

## 2013-08-06 LAB — URINALYSIS, ROUTINE W REFLEX MICROSCOPIC
Bilirubin Urine: NEGATIVE
Glucose, UA: 100 mg/dL — AB
Ketones, ur: 15 mg/dL — AB
Nitrite: NEGATIVE
Protein, ur: 100 mg/dL — AB
SPECIFIC GRAVITY, URINE: 1.01 (ref 1.005–1.030)
UROBILINOGEN UA: 1 mg/dL (ref 0.0–1.0)
pH: 6 (ref 5.0–8.0)

## 2013-08-06 MED ORDER — CHLORHEXIDINE GLUCONATE 0.12 % MT SOLN
15.0000 mL | Freq: Two times a day (BID) | OROMUCOSAL | Status: DC
  Administered 2013-08-06 – 2013-08-11 (×11): 15 mL via OROMUCOSAL
  Filled 2013-08-06 (×14): qty 15

## 2013-08-06 MED ORDER — HALOPERIDOL LACTATE 5 MG/ML IJ SOLN
1.0000 mg | Freq: Four times a day (QID) | INTRAMUSCULAR | Status: DC | PRN
  Administered 2013-08-06: 1 mg via INTRAVENOUS
  Administered 2013-08-07: 2 mg via INTRAVENOUS
  Administered 2013-08-09 – 2013-08-12 (×2): 1 mg via INTRAVENOUS
  Administered 2013-08-15: 2 mg via INTRAVENOUS
  Filled 2013-08-06 (×5): qty 1

## 2013-08-06 NOTE — Progress Notes (Signed)
Physical Therapy Treatment Patient Details Name: Gary [name] MRN: [id number] DOB: 1932/12/30 Today's Date: 08/06/2013    History of Present Illness 80 yo male s/p Rt THA ( direct anterior). Pt developed post-op ileus  and underwent EDG 08/05/13. Pt currently with NG tube suction.    PT Comments    Treatment session limited by increased heart rate at rest. RN states she does not want HR increasing past 140bpm. With minimal activity HR increased to 137bpm. At maximum HR increased to 147bpm while pt trying to tell therapist a story. If mobility does not improve may want to consider SNF as he has had few opportunities to work on and improve mobility thus far. Will continue to follow.   Follow Up Recommendations  Home health PT;Supervision/Assistance - 24 hour     Equipment Recommendations  None recommended by PT    Recommendations for Other Services       Precautions / Restrictions Precautions Precautions: Fall Restrictions Weight Bearing Restrictions: Yes RLE Weight Bearing: Weight bearing as tolerated    Mobility  Bed Mobility Overal bed mobility: Needs Assistance Bed Mobility: Supine to Sit;Sit to Supine     Supine to sit: Mod assist;HOB elevated Sit to supine: Max assist   General bed mobility comments: Pt assisted with scoot to HOB (+2 assist required). HR increased to 137 bpm and RN did not want HR above 140. Further mobility deferred.   Transfers Overall transfer level: Needs assistance   Transfers: Sit to/from Stand;Stand Pivot Transfers Sit to Stand: Mod assist;From elevated surface Stand pivot transfers: +2 physical assistance;Min assist       General transfer comment: pt needed cues and decr ability to advance Rt LE compared to Lt  Ambulation/Gait                 Stairs            Wheelchair Mobility    Modified Rankin (Stroke Patients Only)       Balance Overall balance assessment: Needs assistance Sitting-balance support:  Bilateral upper extremity supported;Feet supported Sitting balance-Leahy Scale: Poor   Postural control: Posterior lean Standing balance support: Bilateral upper extremity supported;During functional activity Standing balance-Leahy Scale: Poor                      Cognition Arousal/Alertness: Lethargic;Suspect due to medications (RN reports pt received Haldol earlier this afternoon) Behavior During Therapy: WFL for tasks assessed/performed Overall Cognitive Status: Impaired/Different from baseline Area of Impairment: Orientation;Attention;Memory;Following commands;Safety/judgement;Awareness;Problem solving Orientation Level: Disoriented to;Place;Time;Situation Current Attention Level: Focused Memory: Decreased short-term memory Following Commands: Follows one step commands inconsistently;Follows one step commands with increased time Safety/Judgement: Decreased awareness of safety;Decreased awareness of deficits Awareness: Intellectual Problem Solving: Slow processing;Decreased initiation;Difficulty sequencing General Comments: Pt reports wifes name and number of years married correct. 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 pt shown an IV pole and reports it would be seen at a hospital. But pt is unable to problem solve he is at a hospital with the same visual cue. Pt oriented x3 during session to place time and reason for admission. pt unable to sustain information longer than 30 seconds    Exercises Total Joint Exercises Ankle Circles/Pumps: 10 reps Quad Sets: 5 reps Hip ABduction/ADduction: 5 reps Knee Flexion: 5 reps    General Comments        Pertinent Vitals/Pain See PT Comments for HR.    Home Living  Prior Function            PT Goals (current goals can now be found in the care plan section) Acute Rehab PT Goals Patient Stated Goal: none stated this session PT Goal Formulation: With patient Time For Goal Achievement:  08/11/13 Potential to Achieve Goals: Good Progress towards PT goals: Not progressing toward goals - comment (Limited by increased HR)    Frequency  7X/week    PT Plan Current plan remains appropriate    Co-evaluation             End of Session Equipment Utilized During Treatment: Gait belt Activity Tolerance: Patient limited by lethargy;Other (comment) (Limited by increased HR) Patient left: in bed;with call bell/phone within reach;with family/visitor present     Time: [id number] PT Time Calculation (min): 33 min  Charges:  $Therapeutic Exercise: 8-22 mins $Therapeutic Activity: 8-22 mins                    G Codes:      Jolyn Lent 08/17/13, 4:30 PM  Jolyn Lent, PT, DPT Acute Rehabilitation Services Pager: [phone]

## 2013-08-06 NOTE — Progress Notes (Signed)
Progress Note   Subjective  feels a little rough   Objective   Vital signs in last 24 hours: Temp:  [98.7 F (37.1 C)-101.3 F (38.5 C)] 99.6 F (37.6 C) (06/26 0806) Pulse Rate:  [93-133] 133 (06/26 0806) Resp:  [16-28] 28 (06/26 0806) BP: (87-173)/(33-80) 141/62 mmHg (06/26 0806) SpO2:  [91 %-100 %] 98 % (06/26 0806) Last BM Date: 08/02/13 General:    white male in NAD Abdomen:  Soft, nontender, protuberant. A few bowel sounds (with NGT pinched off).  Extremities:  Without edema. Neurologic:  Alert and oriented,  grossly normal neurologically. Psych:  Cooperative. Normal mood and affect.  Intake/Output from previous day: 06/25 0701 - 06/26 0700 In: 2962.5 [P.O.:475; I.V.:1892.5; NG/GT:595] Out: 2450 [Urine:1450; Emesis/NG output:1000] Intake/Output this shift: Total I/O In: -  Out: 100 [Urine:100]  Lab Results:  Recent Labs  08/05/13 1245 08/05/13 2020 08/06/13 0308  WBC 8.7 3.0* 2.7*  HGB 9.6* 10.1* 9.4*  HCT 28.9* 29.7* 28.4*  PLT 137* 138* 136*   BMET  Recent Labs  08/04/13 0504 08/06/13 0308  NA 139 137  K 4.6 4.5  CL 100 97  CO2 23 27  GLUCOSE 154* 204*  BUN 27* 33*  CREATININE 1.50* 1.46*  CALCIUM 8.5 8.8   LFT  Recent Labs  08/05/13 1245  PROT 5.5*  ALBUMIN 2.8*  AST 48*  ALT 12  ALKPHOS 34*  BILITOT 1.2  BILIDIR 0.4*  IBILI 0.8   PT/INR  Recent Labs  08/05/13 1245  LABPROT 15.3*  INR 1.21    Studies/Results: Ct Abdomen Pelvis W Contrast  08/05/2013   CLINICAL DATA:  Abdominal distention. Vomiting. Coffee ground emesis.  EXAM: CT ABDOMEN AND PELVIS WITH CONTRAST  TECHNIQUE: Multidetector CT imaging of the abdomen and pelvis was performed using the standard protocol following bolus administration of intravenous contrast.  CONTRAST:  34mL OMNIPAQUE IOHEXOL 300 MG/ML  SOLN  COMPARISON:  Multiple exams, including 01/25/2011  FINDINGS: Trace bilateral pleural effusions with mild atelectasis in the posterior basal segments of  the lower lobes.  Calcification of the mitral valve and coronary arteries.  Old granulomatous disease of the spleen noted. Otherwise, the liver, spleen, pancreas, and adrenal glands appear unremarkable. Gallbladder surgically absent. No biliary dilatation.  2.2 x 1.8 cm complex left mid kidney lesion. Tiny right kidney upper pole cyst. Bilateral mild renal atrophy. No hydronephrosis.  Small retroperitoneal lymph nodes are not pathologically enlarged by size criteria. No pathologic pelvic adenopathy is observed. Mildly dilated loops of proximal small bowel measuring up to 3.5 cm noted. No transition point or nondilated small bowel. Subtotal colectomy with anastomosis appearing patent. Frothy material within the residual colon and rectal segment.  Urinary bladder empty.  Foley catheter in place.  Wound noted along the right hip with right hip implant in place. Minimal residual gas in the adjacent soft tissues, as expected postoperatively.  IMPRESSION: 1. Subtotal colectomy. All of the small bowel and the small residual amount of colon appears dilated, favoring ileus. 2. There is an abnormal complex 2.2 cm lesion in the left mid kidney. In the non acute setting, renal protocol MRI with and without contrast is recommended to further characterize this lesion, which could be neoplastic or could be a complex cyst. 3. Trace bilateral pleural effusions with mild bibasilar atelectasis. 4. Atherosclerosis.   Electronically Signed   By: Sherryl Barters M.D.   On: 08/05/2013 23:33   Dg Abd Portable 1v  08/06/2013   CLINICAL DATA:  Ileus.  EXAM: PORTABLE ABDOMEN - 1 VIEW  COMPARISON:  CT 08/05/2013.  Abdominal series 613 2015.  FINDINGS: Persistent distention of small and large bowel noted suggesting adynamic ileus. No interim resolution from prior CT study 08/05/2013. A developing bowel obstruction cannot be excluded and continued follow-up abdominal series suggested to demonstrate clearing. Calcifications within the pelvis  consistent with phleboliths. Severe degenerative changes lumbar spine. Postsurgical changes lumbar spine and right hip.  IMPRESSION: Persistent prominent distention small large bowel most consistent adynamic ileus. Change followup abdominal series suggested to demonstrate resolution and to exclude bowel obstruction.   Electronically Signed   By: Marcello Moores  Register   On: 08/06/2013 08:48     Assessment / Plan:   65. 80 year old male with abdominal pain and coffee ground emesis. Only mild esophagitis on EGD yesterday. He did have some retained food but probably from a post-op ileus and /or delayed gastric emptying from narcotics. He had a 1000cc NGT output yesterday, only a small amount of output in cannister this am. Output brown, not coffee grounds today. Will give ice chips as he is requesting. Continue IV PPI. CTscan yesterday without acute GI findings.   2. Renal lesion on CTscan. Workup per primary team.     LOS: 3 days   Tye Savoy  08/06/2013, 9:53 AM

## 2013-08-06 NOTE — Progress Notes (Addendum)
Spoke with patient and wife. Per wife he has not been taking NSAIDs regularly at home-he will occasionally take an Advil. He does take baby ASA daily. Has has Ultracet and Hydrocodone for pain. She notes he does get confused after Vicodin use. He aslo takes Ativan every night and is confused in his sleep - prior to starting the Ativan, he was not. He has been confused since Wed which is likely from narcotics use on 6/23- received mostly dilaudid. Narcotics were stopped on 6/24.   Tachycardic-sinus tach-related to fever?-  fluid balance is positive-excellent urine output- cont B blocker.  Full note to follow today.   Saima Rizwan.

## 2013-08-06 NOTE — Clinical Documentation Improvement (Signed)
Query 1 of 2   Clinical Indicators:  Hgb down 5.7 grams/dL this admission  S/P THR 08/03/13  Hematemesis requiring EGD  Serial CBCs since surgery   Possible Clinical Conditions:   - Acute Blood Loss Anemia 2/2 ...................   - Other Condition   - Unable to Clinically Determine   Query 2 of 2  Clinical Indicators:  Documented as being confused, family reports worsening confusion after narcotics, family reports worsening confusion with Ativan, also being provided narcotics for postop pain.  Narcotics were stopped 08/04/13   Possible Clinical Conditions:   - Acute Encephalopathy, including type, if known   - Acute Delirium 2/2 ...................   - Other Condition   - Unable to Clinically Determine    Thank You,  Erling Conte ,RN BSN CCDS Certified Clinical Documentation Specialist:  [phone] [place] [employer]

## 2013-08-06 NOTE — Progress Notes (Signed)
Subjective: 1 Day Post-Op Procedure(s) (LRB): ESOPHAGOGASTRODUODENOSCOPY (EGD) (N/A) Status post anterior hip replacement right side.  Continues in stepdown unit.  Some mild confusion Activity level:  Okay to be out of bed, weightbearing as tolerated.  When okay with medical team.  No hip restrictions Diet tolerance:  Currently n.p.o. Voiding foley Patient reports pain as 3 on 0-10 scale.    Objective: Vital signs in last 24 hours: Temp:  [99.5 F (37.5 C)-101.3 F (38.5 C)] 99.5 F (37.5 C) (06/26 1205) Pulse Rate:  [93-138] 138 (06/26 1205) Resp:  [18-29] 29 (06/26 1205) BP: (87-173)/(33-80) 97/67 mmHg (06/26 1205) SpO2:  [91 %-100 %] 93 % (06/26 1205)  Labs:  Recent Labs  08/04/13 0504 08/05/13 0355 08/05/13 1245 08/05/13 2020 08/06/13 0308  HGB 10.2* 9.8* 9.6* 10.1* 9.4*    Recent Labs  08/05/13 2020 08/06/13 0308  WBC 3.0* 2.7*  RBC 3.30* 3.16*  HCT 29.7* 28.4*  PLT 138* 136*    Recent Labs  08/04/13 0504 08/06/13 0308  NA 139 137  K 4.6 4.5  CL 100 97  CO2 23 27  BUN 27* 33*  CREATININE 1.50* 1.46*  GLUCOSE 154* 204*  CALCIUM 8.5 8.8    Recent Labs  08/05/13 1245  INR 1.21    Physical Exam:  Neurologically intact Neurovascular intact Sensation intact distally Intact pulses distally Dorsiflexion/Plantar flexion intact Incision: dressing C/D/I No cellulitis present  Assessment/Plan:  1 Day Post-Op Procedure(s) (LRB): ESOPHAGOGASTRODUODENOSCOPY (EGD) (N/A) Up with therapywhen approved by medical team. AP weightbearing as tolerated.  There are no hip restrictions. Change dressing as needed. Anti-coagulation currently on hold because of GI issues. Continue PAS. As patient improves consideration to SNF placement versus home with therapy. Dr. Lynann Bologna and Pricilla Holm, PA-C, to follow this weekend.    CARNAGHI,MICHAEL R 08/06/2013, 1:58 PM

## 2013-08-06 NOTE — Progress Notes (Signed)
Progress Note   Subjective  NGT still in place Pt more awake this am, knows where he is and the time/year Denies abd pain, some "not bad" right leg pain Fever overnight Had CT scan  Had very large dark BM late last night   Objective  Vital signs in last 24 hours: Temp:  [98.7 F (37.1 C)-101.3 F (38.5 C)] 99.6 F (37.6 C) (06/26 0806) Pulse Rate:  [93-133] 133 (06/26 0806) Resp:  [16-28] 28 (06/26 0806) BP: (87-173)/(33-80) 141/62 mmHg (06/26 0806) SpO2:  [91 %-100 %] 98 % (06/26 0806) Last BM Date: 08/02/13 Gen: awake, more alert, NAD HEENT: anicteric, op clear, NGT in place draining scant amount of dark green emesis  CV: tachy, regular Pulm: clear anteriorly Abd: more soft this am, improved but present distention (mild), NT, improved BS Ext: scds in place Neuro: moving all four extremities, awake, more alert   Intake/Output from previous day: 06/25 0701 - 06/26 0700 In: 2962.5 [P.O.:475; I.V.:1892.5; NG/GT:595] Out: 2450 [Urine:1450; Emesis/NG output:1000] Intake/Output this shift: Total I/O In: -  Out: 100 [Urine:100]  Lab Results:  Recent Labs  08/05/13 1245 08/05/13 2020 08/06/13 0308  WBC 8.7 3.0* 2.7*  HGB 9.6* 10.1* 9.4*  HCT 28.9* 29.7* 28.4*  PLT 137* 138* 136*   BMET  Recent Labs  08/04/13 0504 08/06/13 0308  NA 139 137  K 4.6 4.5  CL 100 97  CO2 23 27  GLUCOSE 154* 204*  BUN 27* 33*  CREATININE 1.50* 1.46*  CALCIUM 8.5 8.8   LFT  Recent Labs  08/05/13 1245  PROT 5.5*  ALBUMIN 2.8*  AST 48*  ALT 12  ALKPHOS 34*  BILITOT 1.2  BILIDIR 0.4*  IBILI 0.8   PT/INR  Recent Labs  08/05/13 1245  LABPROT 15.3*  INR 1.21   Hepatitis Panel No results found for this basename: HEPBSAG, HCVAB, HEPAIGM, HEPBIGM,  in the last 72 hours  Studies/Results: Ct Abdomen Pelvis W Contrast  08/05/2013   CLINICAL DATA:  Abdominal distention. Vomiting. Coffee ground emesis.  EXAM: CT ABDOMEN AND PELVIS WITH CONTRAST  TECHNIQUE:  Multidetector CT imaging of the abdomen and pelvis was performed using the standard protocol following bolus administration of intravenous contrast.  CONTRAST:  68mL OMNIPAQUE IOHEXOL 300 MG/ML  SOLN  COMPARISON:  Multiple exams, including 01/25/2011  FINDINGS: Trace bilateral pleural effusions with mild atelectasis in the posterior basal segments of the lower lobes.  Calcification of the mitral valve and coronary arteries.  Old granulomatous disease of the spleen noted. Otherwise, the liver, spleen, pancreas, and adrenal glands appear unremarkable. Gallbladder surgically absent. No biliary dilatation.  2.2 x 1.8 cm complex left mid kidney lesion. Tiny right kidney upper pole cyst. Bilateral mild renal atrophy. No hydronephrosis.  Small retroperitoneal lymph nodes are not pathologically enlarged by size criteria. No pathologic pelvic adenopathy is observed. Mildly dilated loops of proximal small bowel measuring up to 3.5 cm noted. No transition point or nondilated small bowel. Subtotal colectomy with anastomosis appearing patent. Frothy material within the residual colon and rectal segment.  Urinary bladder empty.  Foley catheter in place.  Wound noted along the right hip with right hip implant in place. Minimal residual gas in the adjacent soft tissues, as expected postoperatively.  IMPRESSION: 1. Subtotal colectomy. All of the small bowel and the small residual amount of colon appears dilated, favoring ileus. 2. There is an abnormal complex 2.2 cm lesion in the left mid kidney. In the non acute setting, renal protocol  MRI with and without contrast is recommended to further characterize this lesion, which could be neoplastic or could be a complex cyst. 3. Trace bilateral pleural effusions with mild bibasilar atelectasis. 4. Atherosclerosis.   Electronically Signed   By: Sherryl Barters M.D.   On: 08/05/2013 23:33   Dg Abd Portable 1v  08/06/2013   CLINICAL DATA:  Ileus.  EXAM: PORTABLE ABDOMEN - 1 VIEW   COMPARISON:  CT 08/05/2013.  Abdominal series 613 2015.  FINDINGS: Persistent distention of small and large bowel noted suggesting adynamic ileus. No interim resolution from prior CT study 08/05/2013. A developing bowel obstruction cannot be excluded and continued follow-up abdominal series suggested to demonstrate clearing. Calcifications within the pelvis consistent with phleboliths. Severe degenerative changes lumbar spine. Postsurgical changes lumbar spine and right hip.  IMPRESSION: Persistent prominent distention small large bowel most consistent adynamic ileus. Change followup abdominal series suggested to demonstrate resolution and to exclude bowel obstruction.   Electronically Signed   By: Marcello Moores  Register   On: 08/06/2013 08:48      Assessment & Plan  80 y.o. male known to [facility] for his history of internal hemorrhoids status post recent banding, history of C. difficile, history of colon polyps, history of diverticulitis status post colonic resection, PUD, CAD, hypertension, who underwent a right total hip replacement on 08/03/2013, with development of post-operative ileus and vomiting of coffee-ground emesis (EGD with esophagitis without acute bleeding)  1. Post-operative ileus -- CT confirms clinical suspicion of ileus.  Abdomen is less distended this morning, and did have a bowel movement last night. Monitor NG tube output, can clamp if minimal output and bowel movements continue.  Electrolytes normal, continue to closely monitor. Minimize narcotics, mobilized patient is much as possible.  2. Esophagitis -- probably the reason for coffee ground appearance of emesis initially. NG output now is dark green and more bilious as is bowel movement.  He has a very short colon and I would expect 4-6 BM's/day as per his norm at home.  Continue BID PPI for now  3.  Fever -- r/o aspiration, further management per primary team  4.  AMS -- improving, avoiding sedative medications as much as  possible.  5.  Tachycardia -- sinus, perhaps fever related.  On tele.  Per primary team.  6.  Renal lesion -- seen by CT.  Not acute issue.  MRI kidney after d/c.  Principal Problem:   Degenerative joint disease (DJD) of hip Active Problems:   S/P total hip arthroplasty   Urinary retention   Upper GI bleeding   Acute esophagitis   Renal mass, left     LOS: 3 days   PYRTLE, JAY M  08/06/2013, 9:08 AM

## 2013-08-06 NOTE — Progress Notes (Signed)
Occupational Therapy Treatment Patient Details Name: Gary [name] MRN: [id number] DOB: 1932-05-08 Today's Date: 08/06/2013    History of present illness 80 yo male s/p Rt THA ( direct anterior). Pt developed post-op ileus  and underwent EDG 08/05/13   OT comments  Pt currently with elevated HR with all mobility. Pt max HR reached 152 with return to supine and BP decr with transfer. Pt required Ted hose and SCD while voiding on BSC to keep BP stable. Pt decr responses but does not alert staff when BP decr. Pt with void of bowel and large volume of output into the NG tube canister during session.  Follow Up Recommendations  SNF    Equipment Recommendations  Other (comment) (defer SNF)    Recommendations for Other Services      Precautions / Restrictions Precautions Precautions: Fall Restrictions Weight Bearing Restrictions: Yes RLE Weight Bearing: Weight bearing as tolerated       Mobility Bed Mobility Overal bed mobility: Needs Assistance Bed Mobility: Supine to Sit;Sit to Supine     Supine to sit: Mod assist;HOB elevated Sit to supine: Max assist   General bed mobility comments: Pt with strong posterior lean initially with supine <>Sit and then again with stand <>sit on eob. Pt with elevated HR throughout session  Transfers Overall transfer level: Needs assistance   Transfers: Sit to/from Stand;Stand Pivot Transfers Sit to Stand: Mod assist;From elevated surface Stand pivot transfers: +2 physical assistance;Min assist       General transfer comment: pt needed cues and decr ability to advance Rt LE compared to Lt    Balance Overall balance assessment: Needs assistance Sitting-balance support: Bilateral upper extremity supported;Feet supported Sitting balance-Leahy Scale: Poor   Postural control: Posterior lean Standing balance support: Bilateral upper extremity supported;During functional activity Standing balance-Leahy Scale: Poor                      ADL Overall ADL's : Needs assistance/impaired                         Toilet Transfer: Moderate assistance;BSC   Toileting- Clothing Manipulation and Hygiene: Total assistance;+2 for physical assistance;Sit to/from stand Toileting - Clothing Manipulation Details (indicate cue type and reason): therapist helping with static standing while tech performs peri hygiene       General ADL Comments: pt with elevated HR on arrival. pt very talkive but without purpose. pt agreeable to sit EOB> Pt upon sitting with large amount of drainage from NG tube into canister. RN notified at end fo session that canister almost completely full now. Pt transfered to [facility] and voided large loose dark stool. Wife reports this is x3 for today. Wife very tearful during session because pt had called her by coworkers name and unable to recall granddaughter [place] birthday.       Vision                     Perception     Praxis      Cognition   Behavior During Therapy: [facility] for tasks assessed/performed Overall Cognitive Status: Impaired/Different from baseline Area of Impairment: Orientation;Attention;Memory;Following commands;Safety/judgement;Awareness;Problem solving Orientation Level: Disoriented to;Place;Time;Situation Current Attention Level: Focused Memory: Decreased short-term memory  Following Commands: Follows one step commands inconsistently;Follows one step commands with increased time Safety/Judgement: Decreased awareness of safety;Decreased awareness of deficits Awareness: Intellectual Problem Solving: Slow processing;Decreased initiation;Difficulty sequencing General Comments: Pt reports wifes name and number of years  married correct. 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 pt shown an IV pole and reports it would be seen at a hospital. But pt is unable to problem solve he is at a hospital with the same visual cue. Pt oriented x3 during session to place time and reason for  admission. pt unable to sustain information longer than 30 seconds    Extremity/Trunk Assessment               Exercises     Shoulder Instructions       General Comments      Pertinent Vitals/ Pain       After mobility and void pt must more restful in a supine position. Pt returned to supine due to HR and cognitive deficits  Home Living                                          Prior Functioning/Environment              Frequency Min 2X/week     Progress Toward Goals  OT Goals(current goals can now be found in the care plan section)  Progress towards OT goals: Not progressing toward goals - comment (due to delirium and current transfer to ICU)  Acute Rehab OT Goals Patient Stated Goal: none stated this session OT Goal Formulation: With patient Time For Goal Achievement: 08/11/13 Potential to Achieve Goals: Good ADL Goals Pt Will Perform Grooming: with supervision;standing Pt Will Perform Lower Body Bathing: with supervision;with adaptive equipment;sit to/from stand Pt Will Perform Lower Body Dressing: with supervision;sit to/from stand;with adaptive equipment Pt Will Transfer to Toilet: with supervision;ambulating;bedside commode Pt Will Perform Toileting - Clothing Manipulation and hygiene: with supervision;sit to/from stand Pt Will Perform Tub/Shower Transfer: Tub transfer;with min guard assist;ambulating;tub bench;rolling walker Additional ADL Goal #1: Pt will perform bed mobility with min assist with HOB flat and no rail as a precursor to ADL.  Plan Discharge plan remains appropriate    Co-evaluation                 End of Session Equipment Utilized During Treatment: Gait belt   Activity Tolerance Patient tolerated treatment well   Patient Left in bed;with call bell/phone within reach;with bed alarm set;with family/visitor present   Nurse Communication Mobility status;Precautions        Time: [id number] OT Time  Calculation (min): 36 min  Charges: OT General Charges $OT Visit: 1 Procedure OT Treatments $Self Care/Home Management : 23-37 mins  Peri Maris 08/06/2013, 2:48 PM Pager: [phone]

## 2013-08-06 NOTE — Progress Notes (Signed)
Patient seen, examined, and I agree with the above documentation, including the assessment and plan.  

## 2013-08-07 ENCOUNTER — Inpatient Hospital Stay (HOSPITAL_COMMUNITY): Payer: Medicare Other

## 2013-08-07 DIAGNOSIS — A0472 Enterocolitis due to Clostridium difficile, not specified as recurrent: Secondary | ICD-10-CM

## 2013-08-07 DIAGNOSIS — I959 Hypotension, unspecified: Secondary | ICD-10-CM | POA: Diagnosis present

## 2013-08-07 DIAGNOSIS — N281 Cyst of kidney, acquired: Secondary | ICD-10-CM

## 2013-08-07 DIAGNOSIS — E869 Volume depletion, unspecified: Secondary | ICD-10-CM

## 2013-08-07 LAB — COMPREHENSIVE METABOLIC PANEL
ALT: 16 U/L (ref 0–53)
AST: 32 U/L (ref 0–37)
Albumin: 2.2 g/dL — ABNORMAL LOW (ref 3.5–5.2)
Alkaline Phosphatase: 28 U/L — ABNORMAL LOW (ref 39–117)
BUN: 80 mg/dL — ABNORMAL HIGH (ref 6–23)
CO2: 17 meq/L — AB (ref 19–32)
Calcium: 8.1 mg/dL — ABNORMAL LOW (ref 8.4–10.5)
Chloride: 96 mEq/L (ref 96–112)
Creatinine, Ser: 3.47 mg/dL — ABNORMAL HIGH (ref 0.50–1.35)
GFR calc Af Amer: 18 mL/min — ABNORMAL LOW (ref >90)
GFR, EST NON AFRICAN AMERICAN: 15 mL/min — AB (ref >90)
Glucose, Bld: 164 mg/dL — ABNORMAL HIGH (ref 70–99)
POTASSIUM: 3.8 meq/L (ref 3.7–5.3)
Sodium: 137 mEq/L (ref 137–147)
Total Bilirubin: 0.8 mg/dL (ref 0.3–1.2)
Total Protein: 5.8 g/dL — ABNORMAL LOW (ref 6.0–8.3)

## 2013-08-07 LAB — CBC WITH DIFFERENTIAL/PLATELET
BASOS PCT: 1 % (ref 0–1)
Basophils Absolute: 0 10*3/uL (ref 0.0–0.1)
EOS PCT: 1 % (ref 0–5)
Eosinophils Absolute: 0 10*3/uL (ref 0.0–0.7)
HCT: 28.7 % — ABNORMAL LOW (ref 39.0–52.0)
HEMOGLOBIN: 9.6 g/dL — AB (ref 13.0–17.0)
Lymphocytes Relative: 16 % (ref 12–46)
Lymphs Abs: 0.5 10*3/uL — ABNORMAL LOW (ref 0.7–4.0)
MCH: 30.1 pg (ref 26.0–34.0)
MCHC: 33.4 g/dL (ref 30.0–36.0)
MCV: 90 fL (ref 78.0–100.0)
MONOS PCT: 11 % (ref 3–12)
Monocytes Absolute: 0.4 10*3/uL (ref 0.1–1.0)
NEUTROS PCT: 71 % (ref 43–77)
Neutro Abs: 2.3 10*3/uL (ref 1.7–7.7)
Platelets: 179 10*3/uL (ref 150–400)
RBC: 3.19 MIL/uL — AB (ref 4.22–5.81)
RDW: 14.1 % (ref 11.5–15.5)
WBC: 3.2 10*3/uL — AB (ref 4.0–10.5)

## 2013-08-07 LAB — CLOSTRIDIUM DIFFICILE BY PCR: Toxigenic C. Difficile by PCR: POSITIVE — AB

## 2013-08-07 MED ORDER — SODIUM CHLORIDE 0.9 % IV BOLUS (SEPSIS)
500.0000 mL | Freq: Once | INTRAVENOUS | Status: AC
  Administered 2013-08-07: 500 mL via INTRAVENOUS

## 2013-08-07 MED ORDER — DEXTROSE IN LACTATED RINGERS 5 % IV SOLN
INTRAVENOUS | Status: DC
  Administered 2013-08-07: 1000 mL via INTRAVENOUS

## 2013-08-07 MED ORDER — VANCOMYCIN 50 MG/ML ORAL SOLUTION
500.0000 mg | Freq: Four times a day (QID) | ORAL | Status: DC
  Administered 2013-08-07 – 2013-08-08 (×3): 500 mg
  Filled 2013-08-07 (×7): qty 10

## 2013-08-07 MED ORDER — GABAPENTIN 400 MG PO CAPS
400.0000 mg | ORAL_CAPSULE | Freq: Every day | ORAL | Status: DC
Start: 2013-08-08 — End: 2013-08-08
  Filled 2013-08-07: qty 1

## 2013-08-07 MED ORDER — METRONIDAZOLE IN NACL 5-0.79 MG/ML-% IV SOLN
500.0000 mg | Freq: Three times a day (TID) | INTRAVENOUS | Status: DC
  Administered 2013-08-07 – 2013-08-12 (×15): 500 mg via INTRAVENOUS
  Filled 2013-08-07 (×16): qty 100

## 2013-08-07 MED ORDER — LABETALOL HCL 5 MG/ML IV SOLN
5.0000 mg | Freq: Once | INTRAVENOUS | Status: AC
  Administered 2013-08-07: 5 mg via INTRAVENOUS
  Filled 2013-08-07: qty 4

## 2013-08-07 NOTE — Progress Notes (Signed)
Chart reviewed.  [facility] Progress Note  Gary [name] [id number] DOB: [birth year] DOA: 08/03/2013 PCP: [name], MD  Brief narrative: Gary [name] is a 80 y.o. male has undergone a right total hip arthoplasty began to have coffee ground emesis.  GI was called by the orthopedic team and medicine was consulted.  EGD shows esophagitis and ileus v. Gastroparesis. CT abdomen shows ileus.   Assessment/Plan: Principal Problem:   Degenerative joint disease (DJD) of hip/S/P total hip arthroplasty.  Active Problems: Hypotension:  Bolus saline. Hold bisoprolol and flomax until blood pressure stabilizes. Has h/o c diff last year with similar presentation. Will check for c diff, CXR r/o pneumonia.  No labs from today. Will check CBC with diff and CMET.      Urinary retention - cont foley for now- hold voiding trial until blood pressure stabilizes.  Acute encephalopathy Likely medication related.  R/o toxic encephalopathy.  UA negative for infection.  Check c diff and CXR  Ileus Having stools, but also high output from NGT and bowels quiet. Repeat xray.    Upper GI bleeding/ Acute esophagitis - EGD showing esophagitis on PPI.  Complex renal cyst, left  - MRI with and without contrast when more stable   CKD 3 Check labs   Antibiotics: Anti-infectives   Start     Dose/Rate Route Frequency Ordered Stop   08/03/13 1900  ceFAZolin (ANCEF) IVPB 2 g/50 mL premix     2 g 100 mL/hr over 30 Minutes Intravenous  Once 08/03/13 1556 08/03/13 1826   08/03/13 1700  ceFAZolin (ANCEF) IVPB 2 g/50 mL premix  Status:  Discontinued     2 g 100 mL/hr over 30 Minutes Intravenous Every 6 hours 08/03/13 1525 08/03/13 1556   08/03/13 1530  vancomycin (VANCOCIN) IVPB 1000 mg/200 mL premix  Status:  Discontinued     1,000 mg 200 mL/hr over 60 Minutes Intravenous Every 12 hours 08/03/13 1525 08/03/13 1555   08/03/13 1115  vancomycin (VANCOCIN) IVPB 1000 mg/200 mL premix     1,000  mg 200 mL/hr over 60 Minutes Intravenous To Surgery 08/03/13 1109 08/03/13 1121   08/03/13 0600  ceFAZolin (ANCEF) IVPB 2 g/50 mL premix     2 g 100 mL/hr over 30 Minutes Intravenous On call to O.R. 08/02/13 1243 08/03/13 1053    Subjective: Asleep.  Per son, sleepy after working with PT.  RN reports 1000cc from NGT over night. 2 large loose stools  Objective: Filed Weights   08/03/13 0807 08/03/13 1542  Weight: 93.759 kg (206 lb 11.2 oz) 93.441 kg (206 lb)    Vitals Filed Vitals:   08/07/13 0724 08/07/13 1118 08/07/13 1122 08/07/13 1130  BP: 117/77 112/63 76/41 77/45   Pulse: 127 115 102 107  Temp: 98 F (36.7 C)  97.7 F (36.5 C)   TempSrc: Oral  Oral   Resp: 23  30 27   Height:      Weight:      SpO2: 97% 95% 95% 95%     Intake/Output Summary (Last 24 hours) at 08/07/13 1156 Last data filed at 08/07/13 1100  Gross per 24 hour  Intake   1830 ml  Output   3225 ml  Net  -1395 ml     Exam: General: sleepy arousable Lungs: Clear to auscultation bilaterally without wheezes or crackles Cardiovascular: Regular rate and rhythm without murmur gallop or rub normal S1 and S2 Abdomen: Nontender, nondistended, soft, bowel sounds negative, no rebound, no ascites, no appreciable mass  Extremities: No significant cyanosis, clubbing, or edema  Data Reviewed: Basic Metabolic Panel:  Recent Labs Lab 08/04/13 0504 08/06/13 0308  NA 139 137  K 4.6 4.5  CL 100 97  CO2 23 27  GLUCOSE 154* 204*  BUN 27* 33*  CREATININE 1.50* 1.46*  CALCIUM 8.5 8.8   Liver Function Tests:  Recent Labs Lab 08/05/13 1245  AST 48*  ALT 12  ALKPHOS 34*  BILITOT 1.2  PROT 5.5*  ALBUMIN 2.8*    Recent Labs Lab 08/05/13 1245  LIPASE 9*   No results found for this basename: AMMONIA,  in the last 168 hours CBC:  Recent Labs Lab 08/04/13 0504 08/05/13 0355 08/05/13 1245 08/05/13 2020 08/06/13 0308  WBC 7.8 10.4 8.7 3.0* 2.7*  HGB 10.2* 9.8* 9.6* 10.1* 9.4*  HCT 31.4* 29.8*  28.9* 29.7* 28.4*  MCV 91.0 90.6 89.8 90.0 89.9  PLT 133* 142* 137* 138* 136*   Cardiac Enzymes: No results found for this basename: CKTOTAL, CKMB, CKMBINDEX, TROPONINI,  in the last 168 hours BNP (last 3 results) No results found for this basename: PROBNP,  in the last 8760 hours CBG: No results found for this basename: GLUCAP,  in the last 168 hours  No results found for this or any previous visit (from the past 240 hour(s)).   Studies:  Recent x-ray studies have been reviewed in detail by the Attending Physician  Scheduled Meds:  Scheduled Meds: . antiseptic oral rinse  15 mL Mouth Rinse BID  . chlorhexidine  15 mL Mouth Rinse BID  . citalopram  40 mg Oral Daily  . docusate sodium  100 mg Oral BID  . fluticasone  2 spray Each Nare Daily  . gabapentin  400 mg Oral TID  . loratadine  10 mg Oral Daily  . pantoprazole (PROTONIX) IV  40 mg Intravenous Q12H  . pneumococcal 23 valent vaccine  0.5 mL Intramuscular Tomorrow-1000  . simvastatin  40 mg Oral QHS  . sodium chloride  500 mL Intravenous Once   Continuous Infusions: . sodium chloride    . dextrose 5% lactated ringers 75 mL/hr (08/06/13 0800)    Critical care time: 35 min   SULLIVAN,CORINNA L, MD 08/07/2013, 11:56 AM  LOS: 4 days   [facility] [phone]  If 7PM-7AM, please contact night-coverage [email or web address]

## 2013-08-07 NOTE — Progress Notes (Addendum)
[facility] Progress Note   [name] [id number] DOB: May 23, 1932 DOA: 08/03/2013 PCP: [name], MD  Brief narrative: Gary [name] is a 80 y.o. male has undergone a right total hip arthoplasty began to have coffee ground emesis.  GI was called by the orthopedic team and medicine was consulted.    Subjective: Confused. Wife at bedside. Per my earlier note, she notes he gets confused with narcotics and Ativan at home. No h/o dementia.   Assessment/Plan: Principal Problem:   Degenerative joint disease (DJD) of hip/S/P total hip arthroplasty - per primary team- he is OK to get OOB and start PT from my point of view  Active Problems:     Urinary retention - cont foley for now- give voiding train in AM  Acute encephalopathy - suspect due to meds- have ordered low dose Haldol PRN for agitation- if imporvement does not occur soon, would suggest ensuring no underlying infection - currently does not appear to have a UTI or pneumonia  Ileus - xray that I ordered today along with physical exam, reveals continued ileus therefore cont NPO ans IV hydration-     Upper GI bleeding/ Acute esophagitis - EGD showing esophagitis- per GI     Renal mass, left  - MRI suggested when patient stable enough to tolerate it   CKD 3 - creatinine stable when compared to past   Antibiotics: Anti-infectives   Start     Dose/Rate Route Frequency Ordered Stop   08/03/13 1900  ceFAZolin (ANCEF) IVPB 2 g/50 mL premix     2 g 100 mL/hr over 30 Minutes Intravenous  Once 08/03/13 1556 08/03/13 1826   08/03/13 1700  ceFAZolin (ANCEF) IVPB 2 g/50 mL premix  Status:  Discontinued     2 g 100 mL/hr over 30 Minutes Intravenous Every 6 hours 08/03/13 1525 08/03/13 1556   08/03/13 1530  vancomycin (VANCOCIN) IVPB 1000 mg/200 mL premix  Status:  Discontinued     1,000 mg 200 mL/hr over 60 Minutes Intravenous Every 12 hours 08/03/13 1525 08/03/13 1555   08/03/13 1115  vancomycin (VANCOCIN)  IVPB 1000 mg/200 mL premix     1,000 mg 200 mL/hr over 60 Minutes Intravenous To Surgery 08/03/13 1109 08/03/13 1121   08/03/13 0600  ceFAZolin (ANCEF) IVPB 2 g/50 mL premix     2 g 100 mL/hr over 30 Minutes Intravenous On call to O.R. 08/02/13 1243 08/03/13 1053      Objective: Filed Weights   08/03/13 0807 08/03/13 1542  Weight: 93.759 kg (206 lb 11.2 oz) 93.441 kg (206 lb)    Vitals Filed Vitals:   08/06/13 2200 08/06/13 2300 08/06/13 2345 08/07/13 0000  BP: 96/76 95/68  111/52  Pulse: 120 115 123 118  Temp:   98.3 F (36.8 C)   TempSrc:   Oral   Resp: 30 33 33 28  Height:      Weight:      SpO2: 96% 90% 97% 94%     Intake/Output Summary (Last 24 hours) at 08/07/13 0241 Last data filed at 08/06/13 1900  Gross per 24 hour  Intake   1725 ml  Output   2325 ml  Net   -600 ml     Exam: General: No acute respiratory distress[ confused to place and situation Lungs: Clear to auscultation bilaterally without wheezes or crackles Cardiovascular: Regular rate and rhythm without murmur gallop or rub normal S1 and S2 Abdomen: Nontender, nondistended, soft, bowel sounds negative, no rebound, no ascites,  no appreciable mass Extremities: No significant cyanosis, clubbing, or edema  Data Reviewed: Basic Metabolic Panel:  Recent Labs Lab 08/04/13 0504 08/06/13 0308  NA 139 137  K 4.6 4.5  CL 100 97  CO2 23 27  GLUCOSE 154* 204*  BUN 27* 33*  CREATININE 1.50* 1.46*  CALCIUM 8.5 8.8   Liver Function Tests:  Recent Labs Lab 08/05/13 1245  AST 48*  ALT 12  ALKPHOS 34*  BILITOT 1.2  PROT 5.5*  ALBUMIN 2.8*    Recent Labs Lab 08/05/13 1245  LIPASE 9*   No results found for this basename: AMMONIA,  in the last 168 hours CBC:  Recent Labs Lab 08/04/13 0504 08/05/13 0355 08/05/13 1245 08/05/13 2020 08/06/13 0308  WBC 7.8 10.4 8.7 3.0* 2.7*  HGB 10.2* 9.8* 9.6* 10.1* 9.4*  HCT 31.4* 29.8* 28.9* 29.7* 28.4*  MCV 91.0 90.6 89.8 90.0 89.9  PLT 133*  142* 137* 138* 136*   Cardiac Enzymes: No results found for this basename: CKTOTAL, CKMB, CKMBINDEX, TROPONINI,  in the last 168 hours BNP (last 3 results) No results found for this basename: PROBNP,  in the last 8760 hours CBG: No results found for this basename: GLUCAP,  in the last 168 hours  No results found for this or any previous visit (from the past 240 hour(s)).   Studies:  Recent x-ray studies have been reviewed in detail by the Attending Physician  Scheduled Meds:  Scheduled Meds: . antiseptic oral rinse  15 mL Mouth Rinse BID  . bisoprolol  2.5 mg Oral Daily  . chlorhexidine  15 mL Mouth Rinse BID  . citalopram  40 mg Oral Daily  . docusate sodium  100 mg Oral BID  . ferrous sulfate  325 mg Oral TID PC  . fluticasone  2 spray Each Nare Daily  . gabapentin  400 mg Oral TID  . loratadine  10 mg Oral Daily  . pantoprazole (PROTONIX) IV  40 mg Intravenous Q12H  . pneumococcal 23 valent vaccine  0.5 mL Intramuscular Tomorrow-1000  . simvastatin  40 mg Oral QHS  . tamsulosin  0.4 mg Oral QPC supper   Continuous Infusions: . sodium chloride    . dextrose 5% lactated ringers 75 mL/hr (08/06/13 0800)    Time spent on care of this patient: 35 min   [place], MD 08/07/2013, 2:41 AM  LOS: 4 days   [facility] Office  [phone] Pager - Text Page per Shea Evans   If 7PM-7AM, please contact night-coverage [email or web address]

## 2013-08-07 NOTE — Progress Notes (Signed)
    Patient doing well, seen this am. Reports no pain about right hip replacement. NG tube still in place. Mobility limited secondary to medical issues. A&O x3, not sedated this am. Loose BM have started. Medical team working up possible C. Diff.  Intake/Output from previous day:  06/26 0701 - 06/27 0700  In: 1725 [I.V.:1725]  Out: 2325 [Urine:600; Emesis/NG output:1725]  Intake/Output this shift:  Total I/O  In: 1230 [I.V.:1200; NG/GT:30]  Out: 1000 [Emesis/NG output:1000]  Lab Results:   Recent Labs   08/05/13 1245  08/05/13 2020  08/06/13 0308   WBC  8.7  3.0*  2.7*   HGB  9.6*  10.1*  9.4*   HCT  28.9*  29.7*  28.4*   PLT  137*  138*  136*    BMET   Recent Labs   08/06/13 0308   NA  137   K  4.5   CL  97   CO2  27   GLUCOSE  204*   BUN  33*   CREATININE  1.46*   CALCIUM  8.8    LFT   Recent Labs   08/05/13 1245   PROT  5.5*   ALBUMIN  2.8*   AST  48*   ALT  12   ALKPHOS  34*   BILITOT  1.2   BILIDIR  0.4*   IBILI  0.8    PT/INR   Recent Labs   08/05/13 1245   LABPROT  15.3*   INR  1.21    Physical Exam: BP 105/53  Pulse 117  Temp(Src) 98.2 F (36.8 C) (Oral)  Resp 33  Ht 5\' 6"  (1.676 m)  Wt 93.441 kg (206 lb)  BMI 33.27 kg/m2  SpO2 94%  Pt laying in bed, A&Ox3,  Dressing in place R hip CDI, distal compartments soft, 2+ DPP = BIL, NG tube in place, NVI.   POD #4 S/P R Ant Hip replacement per Dr Rhona Raider  -Up with PT, WBAT, No hip restrictions  -Dressing changes PRN -Pharmacy anticoag on hold per GI issues  -SCD's as tolerated - [facility] pending progress per medical team - Medicine following  -C Diff,  ABX therapy initiated  -Post-op ilius, NG tube in place

## 2013-08-07 NOTE — Progress Notes (Signed)
    Progress Note   Subjective  nauseated this am.    Objective   Vital signs in last 24 hours: Temp:  [97.3 F (36.3 C)-100.3 F (37.9 C)] 97.7 F (36.5 C) (06/27 1122) Pulse Rate:  [102-131] 107 (06/27 1130) Resp:  [23-37] 27 (06/27 1130) BP: (76-127)/(37-80) 77/45 mmHg (06/27 1130) SpO2:  [90 %-97 %] 95 % (06/27 1130) Last BM Date: 08/06/13 General:    white male in NAD Heart:  tachycardic Abdomen:  Soft, protuberant but really distended. Hypoactive bowel sounds. Extremities:  Without edema. Neurologic:  drowsy.  Intake/Output from previous day: 06/26 0701 - 06/27 0700 In: 1725 [I.V.:1725] Out: 2325 [Urine:600; Emesis/NG output:1725] Intake/Output this shift: Total I/O In: 1230 [I.V.:1200; NG/GT:30] Out: 1000 [Emesis/NG output:1000]  Lab Results:  Recent Labs  08/05/13 1245 08/05/13 2020 08/06/13 0308  WBC 8.7 3.0* 2.7*  HGB 9.6* 10.1* 9.4*  HCT 28.9* 29.7* 28.4*  PLT 137* 138* 136*   BMET  Recent Labs  08/06/13 0308  NA 137  K 4.5  CL 97  CO2 27  GLUCOSE 204*  BUN 33*  CREATININE 1.46*  CALCIUM 8.8   LFT  Recent Labs  08/05/13 1245  PROT 5.5*  ALBUMIN 2.8*  AST 48*  ALT 12  ALKPHOS 34*  BILITOT 1.2  BILIDIR 0.4*  IBILI 0.8   PT/INR  Recent Labs  08/05/13 1245  LABPROT 15.3*  INR 1.21     Assessment / Plan:   16. 80 year old male coffee ground emesis. Only mild esophagitis on EGD . Continue BID IV PPI.  NGT output brown but not coffee ground appearing.    2. Post-op ileus. NGT output remains high, he vomited this am but that was during episode of hypotension while going for physical therapy. Taking very little ice chips. Primary team ordered repeat abdominal films. Will await results but suspect persistent ileus. No longer taking narcotics which is in our favor. Primary team also ordered C-diff study as patient had loose stool this am.    3. Drowsy. He is less alert today, had hypotensive episode. Possibly getting volume  depleted secondary to NGT suction, NPO status and diarrhea this am. Primary team ordered fluid bolus      LOS: 4 days   Tye Savoy  08/07/2013, 12:41 PM

## 2013-08-07 NOTE — Progress Notes (Signed)
Physical Therapy Treatment Patient Details Name: Gary [name] MRN: [id number] DOB: 12/20/1932 Today's Date: 08/07/2013    History of Present Illness 80 yo male s/p Rt THA ( direct anterior). Pt developed post-op ileus  and underwent EDG 08/05/13. Pt currently with NG tube suction.    PT Comments    Pt lethargic on arrival but willing to work. Pt with drop in BP from 112/63 (75) to 95/50 (61) with transfer to sitting. With continued sitting EOB 3 min continued decline in pressure to 68/40 (45) with HR from 112-127. Pt returned to supine where he vomited bile. BP rose to 102/87 with return to supine and RN notified of pt status with NGT resumed and pt changed and cleaned. Pt and son encouraged to perform HEP as able with mobility progression at this time pending pt medical status. Will decreased frequency and discharge recommendation at this time based on pt current status.   Follow Up Recommendations  SNF;Supervision/Assistance - 24 hour     Equipment Recommendations       Recommendations for Other Services       Precautions / Restrictions Precautions Precautions: Fall Precaution Comments: NGT Restrictions RLE Weight Bearing: Weight bearing as tolerated    Mobility  Bed Mobility Overal bed mobility: Needs Assistance Bed Mobility: Supine to Sit;Sit to Supine     Supine to sit: Min assist Sit to supine: +2 for physical assistance;Mod assist   General bed mobility comments: pt with min assist to pivot to EOB, elevate trunk and scoot to EOB with dizziness reported in sitting with continued decline and return to supine +2 for safety and lines. With return to supine to scoot pt up pt with large amount of bile vomited,. Pt disconnected from NGT on arrival with NGT reconnected and returned to intermittent suction with large output and RN Estill Bamberg notified and present in room  Transfers Overall transfer level:  (unable to progress with transfers today due to hypotension)                   Ambulation/Gait                 Stairs            Wheelchair Mobility    Modified Rankin (Stroke Patients Only)       Balance Overall balance assessment: Needs assistance   Sitting balance-Leahy Scale: Poor                              Cognition Arousal/Alertness: Lethargic Behavior During Therapy: Flat affect Overall Cognitive Status: Impaired/Different from baseline Area of Impairment: Memory;Safety/judgement;Problem solving   Current Attention Level: Sustained Memory: Decreased short-term memory   Safety/Judgement: Decreased awareness of safety;Decreased awareness of deficits   Problem Solving: Slow processing;Decreased initiation;Difficulty sequencing General Comments: Pt oriented today but lethargic on arrival, with appropriate conversation however end of session more lethargic trying to converse but not making sense     Exercises General Exercises - Lower Extremity Long Arc Quad: AROM;Seated;Both;15 reps    General Comments        Pertinent Vitals/Pain No pain     Home Living                      Prior Function            PT Goals (current goals can now be found in the care plan section) Progress towards PT goals: Not  progressing toward goals - comment (due to medical complications)    Frequency  Min 5X/week    PT Plan Discharge plan needs to be updated;Frequency needs to be updated    Co-evaluation             End of Session   Activity Tolerance: Treatment limited secondary to medical complications (Comment) Patient left: in bed;with call bell/phone within reach;with family/visitor present     Time: [id number] PT Time Calculation (min): 35 min  Charges:  $Therapeutic Activity: 23-37 mins                    G Codes:      Melford Aase 2013/08/16, 11:28 AM Elwyn Reach, [place]

## 2013-08-07 NOTE — Progress Notes (Signed)
Patient seen, examined, and I agree with the above documentation, including the assessment and plan. C. difficile PCR is positive -- initiate IV Flagyl and oral vancomycin per NG tube, changed to by mouth vancomycin when possible Stooling now more frequently with diarrhea, will place rectal tube NG tube is still considerable output so cannot discontinue yet Continue to monitor electrolytes He is likely volume depleted in the setting of NG tube output and now diarrhea; he received 500 cc fluid bolus and I will increase maintenance rate 125 mL per hour at this time DC Foley when possible Avoiding narcotics Abdominal exam reveals soft her abdomen than yesterday with improved bowel sounds , though they remain hypoactive. Expect improvement with C. difficile treatment

## 2013-08-08 DIAGNOSIS — R6520 Severe sepsis without septic shock: Secondary | ICD-10-CM

## 2013-08-08 DIAGNOSIS — N179 Acute kidney failure, unspecified: Secondary | ICD-10-CM

## 2013-08-08 DIAGNOSIS — A419 Sepsis, unspecified organism: Secondary | ICD-10-CM | POA: Diagnosis present

## 2013-08-08 LAB — BASIC METABOLIC PANEL
BUN: 111 mg/dL — ABNORMAL HIGH (ref 6–23)
BUN: 123 mg/dL — ABNORMAL HIGH (ref 6–23)
CALCIUM: 7.8 mg/dL — AB (ref 8.4–10.5)
CO2: 17 meq/L — AB (ref 19–32)
CO2: 18 meq/L — AB (ref 19–32)
CREATININE: 4.91 mg/dL — AB (ref 0.50–1.35)
CREATININE: 5.4 mg/dL — AB (ref 0.50–1.35)
Calcium: 7.6 mg/dL — ABNORMAL LOW (ref 8.4–10.5)
Chloride: 99 mEq/L (ref 96–112)
Chloride: 99 mEq/L (ref 96–112)
GFR calc Af Amer: 12 mL/min — ABNORMAL LOW (ref >90)
GFR calc Af Amer: 10 mL/min — ABNORMAL LOW (ref >90)
GFR calc non Af Amer: 9 mL/min — ABNORMAL LOW (ref >90)
GFR, EST NON AFRICAN AMERICAN: 10 mL/min — AB (ref >90)
Glucose, Bld: 176 mg/dL — ABNORMAL HIGH (ref 70–99)
Glucose, Bld: 147 mg/dL — ABNORMAL HIGH (ref 70–99)
Potassium: 3.5 mEq/L — ABNORMAL LOW (ref 3.7–5.3)
Potassium: 3.4 mEq/L — ABNORMAL LOW (ref 3.7–5.3)
Sodium: 140 mEq/L (ref 137–147)
Sodium: 139 mEq/L (ref 137–147)

## 2013-08-08 MED ORDER — SODIUM CHLORIDE 0.9 % IV BOLUS (SEPSIS)
1000.0000 mL | Freq: Once | INTRAVENOUS | Status: AC
  Administered 2013-08-08: 1000 mL via INTRAVENOUS

## 2013-08-08 MED ORDER — PANTOPRAZOLE SODIUM 40 MG IV SOLR
40.0000 mg | INTRAVENOUS | Status: DC
  Administered 2013-08-09: 40 mg via INTRAVENOUS
  Filled 2013-08-08: qty 40

## 2013-08-08 MED ORDER — SODIUM CHLORIDE 0.9 % IR SOLN
500.0000 mg | Freq: Four times a day (QID) | Status: DC
  Administered 2013-08-08 – 2013-08-10 (×8): 500 mg via RECTAL
  Filled 2013-08-08 (×18): qty 500

## 2013-08-08 MED ORDER — SODIUM CHLORIDE 0.9 % IV BOLUS (SEPSIS): 1000.0000 mL | INTRAVENOUS | Status: DC | PRN

## 2013-08-08 MED ORDER — DEXTROSE-NACL 5-0.9 % IV SOLN
INTRAVENOUS | Status: DC
  Administered 2013-08-08 (×2): via INTRAVENOUS

## 2013-08-08 MED ORDER — SODIUM BICARBONATE 8.4 % IV SOLN
INTRAVENOUS | Status: DC
  Filled 2013-08-08 (×2): qty 100

## 2013-08-08 MED ORDER — SORBITOL 70 % SOLN
960.0000 mL | TOPICAL_OIL | Freq: Once | ORAL | Status: DC
  Filled 2013-08-08: qty 240

## 2013-08-08 MED ORDER — SODIUM BICARBONATE 8.4 % IV SOLN
INTRAVENOUS | Status: DC
  Administered 2013-08-08 – 2013-08-10 (×4): via INTRAVENOUS
  Filled 2013-08-08 (×8): qty 1000

## 2013-08-08 MED ORDER — ACETAMINOPHEN 10 MG/ML IV SOLN
1000.0000 mg | Freq: Four times a day (QID) | INTRAVENOUS | Status: AC | PRN
  Administered 2013-08-08: 1000 mg via INTRAVENOUS
  Filled 2013-08-08 (×2): qty 100

## 2013-08-08 MED ORDER — VANCOMYCIN 50 MG/ML ORAL SOLUTION
250.0000 mg | Freq: Four times a day (QID) | ORAL | Status: DC
  Filled 2013-08-08 (×3): qty 5

## 2013-08-08 NOTE — Progress Notes (Signed)
SCHEDULED IV ACETAMINOPHEN:  CONVERSION TO ORAL ROUTE to complete the ordered doses.  The Pharmacy and Therapeutics Committee has restricted administration of IV acetaminophen (with a 24 hr maximum duration) to patients who meet both of the following criteria:  Unable to tolerate oral or enteral medication  Contraindication to NSAIDs  Spoke with Dr. Wynelle Cleveland and patient is unable to tolerate oral medications and has a contraindication to NSAIDs or narcotics.  Will change order and use IV Acetaminophen as needed for fever and pain.    Plan:  Will f/u ability to tolerate oral meds and change to this when appropriate.   Rober Minion, PharmD., MS Clinical Pharmacist Pager:  [phone] Thank you for allowing pharmacy to be part of this patients care team. 08/08/2013 4:56 PM

## 2013-08-08 NOTE — Progress Notes (Addendum)
Chart reviewed.  [facility] Progress Note  Gary [name] [id number] DOB: [birth year] DOA: 08/03/2013 PCP: [name], MD  Brief narrative: Gary [name] is a 80 y.o. male has undergone a right total hip arthoplasty began to have coffee ground emesis.  GI was called by the orthopedic team and medicine was consulted.  EGD shows esophagitis and ileus v. Gastroparesis. CT abdomen shows ileus.  Subjective: Alert and oriented today- mouth is dry. No significant abd pain- is incontinent of stool  Assessment/Plan: Principal Problem: Severe C diff colitis with ileus/ Sepsis with hypotension tachycardia leukopenia and fevers (temp 101.3 on 6/25) - started having loose stools yesterday and noted to be C diff positive  -currently with rectal tube- 1000 cc emptied this AM - Per Dr Kelby Fam note from 05/12/13, this would be his 3rd episode - switch oral vanc to vanc enemas as NG tube to suction due to ileus - Cont IV Flagyl - cont to follow in SDU  Hypotension:/ ARF on CKD - 1 L Bolus NS  Now- changed to D5 NS from D5 ringers - cont to follow closely- very tachycardic  - Hold bisoprolol and flomax until blood pressure stabilizes.   Ileus - due to C diff - Having stools, but also high output from NGT and bowels quiet.  -cont IVF to  Match Output from urine stool and NG tube  ARF on CKD 3 - likely due to sepsis, pre-renal - in negative balance by 1 L - change D5 Ringers lactate to D5NS- bolus 1 L  NS now      Urinary retention - cont foley for now- hold voiding trial until blood pressure stabilizes.    Degenerative joint disease (DJD) of hip/S/P total hip arthroplasty.  Acute encephalopathy - signification confusion suspected to be related to Narcotics and Ativan -  Completely resolved today     Upper GI bleeding/ Acute esophagitis - EGD showing esophagitis on PPI.  Complex renal cyst, left  - MRI with and without contrast when more  stable    Antibiotics: Anti-infectives   Start     Dose/Rate Route Frequency Ordered Stop   08/08/13 1200  vancomycin (VANCOCIN) 500 mg in sodium chloride irrigation 0.9 % 100 mL ENEMA     500 mg Rectal 4 times per day 08/08/13 0858     08/07/13 1815  vancomycin (VANCOCIN) 50 mg/mL oral solution 500 mg  Status:  Discontinued     500 mg Per Tube 4 times per day 08/07/13 1809 08/08/13 0858   08/07/13 1815  metroNIDAZOLE (FLAGYL) IVPB 500 mg     500 mg 100 mL/hr over 60 Minutes Intravenous Every 8 hours 08/07/13 1809 08/21/13 1814   08/03/13 1900  ceFAZolin (ANCEF) IVPB 2 g/50 mL premix     2 g 100 mL/hr over 30 Minutes Intravenous  Once 08/03/13 1556 08/03/13 1826   08/03/13 1700  ceFAZolin (ANCEF) IVPB 2 g/50 mL premix  Status:  Discontinued     2 g 100 mL/hr over 30 Minutes Intravenous Every 6 hours 08/03/13 1525 08/03/13 1556   08/03/13 1530  vancomycin (VANCOCIN) IVPB 1000 mg/200 mL premix  Status:  Discontinued     1,000 mg 200 mL/hr over 60 Minutes Intravenous Every 12 hours 08/03/13 1525 08/03/13 1555   08/03/13 1115  vancomycin (VANCOCIN) IVPB 1000 mg/200 mL premix     1,000 mg 200 mL/hr over 60 Minutes Intravenous To Surgery 08/03/13 1109 08/03/13 1121   08/03/13 0600  ceFAZolin (ANCEF) IVPB 2  g/50 mL premix     2 g 100 mL/hr over 30 Minutes Intravenous On call to O.R. 08/02/13 1243 08/03/13 1053     Objective: Filed Weights   08/03/13 0807 08/03/13 1542 08/08/13 0438  Weight: 93.759 kg (206 lb 11.2 oz) 93.441 kg (206 lb) 90.6 kg (199 lb 11.8 oz)    Vitals Filed Vitals:   08/07/13 2025 08/07/13 2353 08/08/13 0438 08/08/13 0800  BP: 105/53 107/47 84/43 115/54  Pulse: 117 112 127 115  Temp: 98.2 F (36.8 C) 98.8 F (37.1 C) 98 F (36.7 C)   TempSrc: Oral Axillary Oral   Resp: 33 29 28 31   Height:      Weight:   90.6 kg (199 lb 11.8 oz)   SpO2: 94% 96% 98% 95%     Intake/Output Summary (Last 24 hours) at 08/08/13 0858 Last data filed at 08/08/13 0700  Gross  per 24 hour  Intake   1355 ml  Output   3350 ml  Net  -1995 ml     Exam: General: alert and oriented, no distress Lungs: Clear to auscultation bilaterally without wheezes or crackles Cardiovascular: Regular rate and rhythm without murmur gallop or rub normal S1 and S2 Abdomen: Nontender, nondistended, soft, bowel sounds negative, no rebound, no ascites, no appreciable mass- rectal tube with liquid stool Extremities: No significant cyanosis, clubbing, or edema  Data Reviewed: Basic Metabolic Panel:  Recent Labs Lab 08/04/13 0504 08/06/13 0308 08/07/13 1406  NA 139 137 137  K 4.6 4.5 3.8  CL 100 97 96  CO2 23 27 17*  GLUCOSE 154* 204* 164*  BUN 27* 33* 80*  CREATININE 1.50* 1.46* 3.47*  CALCIUM 8.5 8.8 8.1*   Liver Function Tests:  Recent Labs Lab 08/05/13 1245 08/07/13 1406  AST 48* 32  ALT 12 16  ALKPHOS 34* 28*  BILITOT 1.2 0.8  PROT 5.5* 5.8*  ALBUMIN 2.8* 2.2*    Recent Labs Lab 08/05/13 1245  LIPASE 9*   No results found for this basename: AMMONIA,  in the last 168 hours CBC:  Recent Labs Lab 08/05/13 0355 08/05/13 1245 08/05/13 2020 08/06/13 0308 08/07/13 1406  WBC 10.4 8.7 3.0* 2.7* 3.2*  NEUTROABS  --   --   --   --  2.3  HGB 9.8* 9.6* 10.1* 9.4* 9.6*  HCT 29.8* 28.9* 29.7* 28.4* 28.7*  MCV 90.6 89.8 90.0 89.9 90.0  PLT 142* 137* 138* 136* 179   Cardiac Enzymes: No results found for this basename: CKTOTAL, CKMB, CKMBINDEX, TROPONINI,  in the last 168 hours BNP (last 3 results) No results found for this basename: PROBNP,  in the last 8760 hours CBG: No results found for this basename: GLUCAP,  in the last 168 hours  Recent Results (from the past 240 hour(s))  CLOSTRIDIUM DIFFICILE BY PCR     Status: Abnormal   Collection Time    08/07/13  2:45 PM      Result Value Ref Range Status   C difficile by pcr POSITIVE (*) NEGATIVE Final   Comment: CRITICAL RESULT CALLED TO, READ BACK BY AND VERIFIED WITH:     Novella Rob RN AT 1804  08/07/13 BY WOOLLENK     Studies:  Recent x-ray studies have been reviewed in detail by the Attending Physician  Scheduled Meds:  Scheduled Meds: . antiseptic oral rinse  15 mL Mouth Rinse BID  . chlorhexidine  15 mL Mouth Rinse BID  . citalopram  40 mg Oral Daily  . fluticasone  2 spray Each Nare Daily  . gabapentin  400 mg Oral QHS  . metronidazole  500 mg Intravenous Q8H  . pantoprazole (PROTONIX) IV  40 mg Intravenous Q12H  . pneumococcal 23 valent vaccine  0.5 mL Intramuscular Tomorrow-1000  . simvastatin  40 mg Oral QHS  . sodium chloride  1,000 mL Intravenous Once  . vancomycin (VANCOCIN) rectal ENEMA  500 mg Rectal 4 times per day   Continuous Infusions: . sodium chloride    . dextrose 5 % and 0.9% NaCl 125 mL/hr at 08/08/13 0848    Critical care time: 62 min   [place], MD 08/08/2013, 8:58 AM  LOS: 5 days   [email or web address]

## 2013-08-08 NOTE — Progress Notes (Signed)
Progress Note   Subjective  no specific complaints   Objective   Vital signs in last 24 hours: Temp:  [97.7 F (36.5 C)-98.8 F (37.1 C)] 97.9 F (36.6 C) (06/28 0800) Pulse Rate:  [102-127] 115 (06/28 0800) Resp:  [25-34] 31 (06/28 0800) BP: (76-116)/(41-63) 115/54 mmHg (06/28 0800) SpO2:  [93 %-98 %] 95 % (06/28 0800) Weight:  [199 lb 11.8 oz (90.6 kg)] 199 lb 11.8 oz (90.6 kg) (06/28 0438) Last BM Date: 08/08/13 General:    white male in NAD. Family at bedside. Heart:  Tachycardic Abdomen:  Soft, obese,mild diffuse tenderness. Decrease bowel sounds, even on LIS.  Extremities:  Without edema. Neurologic:  Keeps eyes closed but answers questions.  Psych:  Cooperative.   Lab Results:  Recent Labs  08/05/13 2020 08/06/13 0308 08/07/13 1406  WBC 3.0* 2.7* 3.2*  HGB 10.1* 9.4* 9.6*  HCT 29.7* 28.4* 28.7*  PLT 138* 136* 179   BMET  Recent Labs  08/06/13 0308 08/07/13 1406  NA 137 137  K 4.5 3.8  CL 97 96  CO2 27 17*  GLUCOSE 204* 164*  BUN 33* 80*  CREATININE 1.46* 3.47*  CALCIUM 8.8 8.1*   LFT  Recent Labs  08/05/13 1245 08/07/13 1406  PROT 5.5* 5.8*  ALBUMIN 2.8* 2.2*  AST 48* 32  ALT 12 16  ALKPHOS 34* 28*  BILITOT 1.2 0.8  BILIDIR 0.4*  --   IBILI 0.8  --    PT/INR  Recent Labs  08/05/13 1245  LABPROT 15.3*  INR 1.21    Studies/Results: Dg Abd 1 View  08/07/2013   CLINICAL DATA:  Ileus and diarrhea.  EXAM: ABDOMEN - 1 VIEW  COMPARISON:  08/06/2013  FINDINGS: Single supine view of the abdomen and pelvis. Nasogastric terminating at the distal stomach. Extensive lumbosacral spine hardware. Right hip arthroplasty.  Similar moderate small dilatation at 4.1 cm. No pneumatosis or free intraperitoneal air. Relative paucity of colonic gas.  IMPRESSION: Similar small bowel dilatation. Adynamic ileus versus partial small bowel obstruction. No free intraperitoneal air or other acute complication, given limitation of portable supine radiograph.    Electronically Signed   By: Abigail Miyamoto M.D.   On: 08/07/2013 13:33   Dg Chest Port 1 View  08/07/2013   CLINICAL DATA:  Confusion hypotension.  Rule out pneumonia.  EXAM: PORTABLE CHEST - 1 VIEW  COMPARISON:  08/26/2012  FINDINGS: Numerous leads and wires project over the chest. Midline trachea. Nasogastric tube terminates the body of the stomach. Cardiomegaly accentuated by AP portable technique. Mild right hemidiaphragm elevation. No pleural effusion or pneumothorax. Bibasilar volume loss with atelectasis. No congestive failure.  IMPRESSION: No evidence of pneumonia.  Cardiomegaly with low lung volumes and bibasilar volume loss.   Electronically Signed   By: Abigail Miyamoto M.D.   On: 08/07/2013 13:45     Assessment / Plan:    1. Esophagitis. No further bleeding so will decrease PPI to QD, especially given + c-diff.    2. Post-op ileus. No significant improvement on abdominal film yesterday.    3. Hypotensive episode yesterday, progressive renal insufficiency (creat 1.45 >>>3.47). Likely volume depleted with diarrhea. NGT suction, NPO. BP better, still tachycardic today. Got another bolus of IVF this am. Maintenance dose. Increased to 162ml yesterday. Repeat BMET pending.   4. C-diff, recurrent and probably contributing to ileus. He has had a subtotal colectomy. Currently getting IV flagyl and vancomycin. Vanco was changed from oral to enema. If cannot retain enema  when flexiseal clamped we may need to add back oral vancomycin though absorption may be unpredictable with ileus. Hopefully with treatment of c-diff patient will start to improve.  Currently averaging about 100cc/hr NGT output which is a little better. His abdominal exam is not overly concerning at this point but  leukopenia, thrombocytopenia, and hemodynamic parameters are all concerning.        LOS: 5 days   Gary [name]  08/08/2013, 9:05 AM

## 2013-08-08 NOTE — Clinical Documentation Improvement (Addendum)
  Clinical Indicators:  Hypotension treated with IV fluids, Acute Renal Failure, developed Sepsis, 2.01 increase in creatinine from 08/06/13 to 08/07/13, repeat creatinine on 07/2813 - 4.91   Possible Conditions:   - Acute Tubular Necrosis 2/2 ....................   - Other Condition   - Unable to Clinically Determine   Thank You,  Erling Conte ,RN BSN CCDS Certified Clinical Documentation Specialist:  [phone] [facility] [employer]

## 2013-08-08 NOTE — Progress Notes (Signed)
Patient seen, examined, and I agree with the above documentation, including the assessment and plan. Continue IV flagyl for C diff, liquid vanco PR and oral when possible Worsening renal function; likely prerenal state, per primary team Will follow

## 2013-08-08 NOTE — Progress Notes (Signed)
   Patient doing well from orthopaedic standpoint. Reports no pain about right hip replacement. Feeling some better and more alert/energy today in comparison to yesterday from medical standpoint. Has PO ilius, sepsis, ARF and C.diff,  He has received fluids and ABX. NG tube still in place, rectal tube placed yesterday. Mobility limited secondary to medical issues.   Physical Exam:  BP 90/48  Pulse 118  Temp(Src) 98 F (36.7 C) (Oral)  Resp 33  Ht 5\' 6"  (1.676 m)  Wt 90.6 kg (199 lb 11.8 oz)  BMI 32.25 kg/m2  SpO2 97%  Pt laying in bed, A&Ox3, Dressing in place R hip CDI, distal compartments soft, 2+ DPP = BIL, NG tube in place, rectal tube in place. NVI. Family at bedside.  Lab Results:   Recent Labs   08/05/13 2020  08/06/13 0308  08/07/13 1406   WBC  3.0*  2.7*  3.2*   HGB  10.1*  9.4*  9.6*   HCT  29.7*  28.4*  28.7*   PLT  138*  136*  179    BMET   Recent Labs   08/06/13 0308  08/07/13 1406   NA  137  137   K  4.5  3.8   CL  97  96   CO2  27  17*   GLUCOSE  204*  164*   BUN  33*  80*   CREATININE  1.46*  3.47*   CALCIUM  8.8  8.1*    LFT   Recent Labs   08/05/13 1245  08/07/13 1406   PROT  5.5*  5.8*   ALBUMIN  2.8*  2.2*   AST  48*  32   ALT  12  16   ALKPHOS  34*  28*   BILITOT  1.2  0.8   BILIDIR  0.4*  --   IBILI  0.8  --     POD #5 S/P R Ant Hip replacement per Dr Rhona Raider   -Up with PT as tolerated per Med Team   -WBAT, No hip restrictions   -Dressing changes PRN   -Pharmacy anticoag on hold per GI issues    -SCD's as tolerated   - D/C home with [facility] vs SNF pending progress/mobility per medical team   - Medicine following   -Sepsis, on ABX  -C Diff, ABX therapy initiated, rectal tube placed  -Post-op ilius, NG tube in place  -ARF, pre-renal, receiving fluids   -Hypotension, receiving fluids

## 2013-08-09 ENCOUNTER — Encounter: Payer: Self-pay | Admitting: *Deleted

## 2013-08-09 ENCOUNTER — Inpatient Hospital Stay (HOSPITAL_COMMUNITY): Payer: Medicare Other

## 2013-08-09 DIAGNOSIS — G9340 Encephalopathy, unspecified: Secondary | ICD-10-CM | POA: Diagnosis not present

## 2013-08-09 DIAGNOSIS — R195 Other fecal abnormalities: Secondary | ICD-10-CM

## 2013-08-09 DIAGNOSIS — Q619 Cystic kidney disease, unspecified: Secondary | ICD-10-CM

## 2013-08-09 DIAGNOSIS — D62 Acute posthemorrhagic anemia: Secondary | ICD-10-CM | POA: Diagnosis not present

## 2013-08-09 LAB — BASIC METABOLIC PANEL
BUN: 129 mg/dL — ABNORMAL HIGH (ref 6–23)
CALCIUM: 7.8 mg/dL — AB (ref 8.4–10.5)
CO2: 19 meq/L (ref 19–32)
Chloride: 101 mEq/L (ref 96–112)
Creatinine, Ser: 5.39 mg/dL — ABNORMAL HIGH (ref 0.50–1.35)
GFR calc Af Amer: 10 mL/min — ABNORMAL LOW (ref >90)
GFR calc non Af Amer: 9 mL/min — ABNORMAL LOW (ref >90)
Glucose, Bld: 178 mg/dL — ABNORMAL HIGH (ref 70–99)
Potassium: 3.7 mEq/L (ref 3.7–5.3)
Sodium: 140 mEq/L (ref 137–147)

## 2013-08-09 LAB — CBC
HCT: 27.2 % — ABNORMAL LOW (ref 39.0–52.0)
Hemoglobin: 9.4 g/dL — ABNORMAL LOW (ref 13.0–17.0)
MCH: 29.7 pg (ref 26.0–34.0)
MCHC: 34.6 g/dL (ref 30.0–36.0)
MCV: 85.8 fL (ref 78.0–100.0)
PLATELETS: 216 10*3/uL (ref 150–400)
RBC: 3.17 MIL/uL — ABNORMAL LOW (ref 4.22–5.81)
RDW: 14.4 % (ref 11.5–15.5)
WBC: 7.3 10*3/uL (ref 4.0–10.5)

## 2013-08-09 MED ORDER — SODIUM CHLORIDE 0.9 % IV BOLUS (SEPSIS)
1000.0000 mL | Freq: Once | INTRAVENOUS | Status: AC
  Administered 2013-08-09: 1000 mL via INTRAVENOUS

## 2013-08-09 NOTE — Progress Notes (Signed)
Occupational Therapy Treatment Patient Details Name: Gary [name] MRN: [id number] DOB: May 28, 1932 Today's Date: 08/09/2013    History of present illness 80 yo male s/p Rt THA ( direct anterior). Pt developed post-op ileus  and underwent EDG 08/05/13. Pt currently with NG tube suction.   OT comments  Pt very limited by orthostatic BP. Pt very willing to participate and only reports symptoms when asked. Pt noted to have large volume of output into flexiseal and incr drainage to NG tube with supine<>Sit. Pt limited arousal due to orthostatic state.   Follow Up Recommendations  SNF    Equipment Recommendations  Other (comment)    Recommendations for Other Services      Precautions / Restrictions Precautions Precautions: Fall Precaution Comments: foley / flexiseal Restrictions Weight Bearing Restrictions: Yes RLE Weight Bearing: Weight bearing as tolerated       Mobility Bed Mobility Overal bed mobility: Needs Assistance Bed Mobility: Supine to Sit     Supine to sit: +2 for physical assistance;Mod assist     General bed mobility comments: pt advancing LT LE to EOB. pt requires total (A) with RT LE. pt needs total +2 to sequence core to upright posture   Transfers Overall transfer level: Needs assistance Equipment used: 2 person hand held assist Transfers: Sit to/from Stand;Stand Pivot Transfers Sit to Stand: +2 physical assistance;Min assist Stand pivot transfers: +2 physical assistance;Min assist       General transfer comment: pt with orthostatic BP     Balance Overall balance assessment: Needs assistance Sitting-balance support: Bilateral upper extremity supported;Feet supported Sitting balance-Leahy Scale: Poor Sitting balance - Comments: needs v/c to report dizziness Postural control: Posterior lean                         ADL Overall ADL's : Needs assistance/impaired Eating/Feeding: Set up;Sitting Eating/Feeding Details (indicate cue type and  reason): pt requires extra time and effort to spoon ice from cup. pt with tremor present                                   General ADL Comments: Pt noted to not have ted hose present in room. RN notified. OT leaving scd machine attached with supine<>sit eob. pt allowed time to sit EOB . SCD tubing disconnected for transfer. pt noted to have large drop in BP see below in note. Pt required incr time to rebound. RN called to room. Ted hose ordered . Due to BP drop (orthostatic event) session very limited.      Vision                     Perception     Praxis      Cognition   Behavior During Therapy: Flat affect Overall Cognitive Status: Impaired/Different from baseline Area of Impairment: Attention;Safety/judgement;Problem solving   Current Attention Level: Sustained        Awareness: Emergent Problem Solving: Slow processing;Decreased initiation General Comments: Pt was able to verbalize location, reason for admission and Rt hip operation    Extremity/Trunk Assessment               Exercises Total Joint Exercises Ankle Circles/Pumps: AAROM;20 reps;Seated   Shoulder Instructions       General Comments      Pertinent Vitals/ Pain       Supine 115/67  Sitting s/p transfer 72/41 (49)          Supine in chair scd on 103/49 (58)       Greater than 5 minutes supine in chair with SCD on 103/54 (66)       RN stephanie called to room to inform of BP status  Home Living                                          Prior Functioning/Environment              Frequency Min 2X/week     Progress Toward Goals  OT Goals(current goals can now be found in the care plan section)  Progress towards OT goals: Not progressing toward goals - comment (orthostatic BP)  Acute Rehab OT Goals Patient Stated Goal: none stated this session OT Goal Formulation: With patient Time For Goal Achievement: 08/23/13 Potential to Achieve  Goals: Good ADL Goals Pt Will Perform Grooming: with supervision;standing Pt Will Perform Lower Body Bathing: with supervision;with adaptive equipment;sit to/from stand Pt Will Perform Lower Body Dressing: with supervision;sit to/from stand;with adaptive equipment Pt Will Transfer to Toilet: with supervision;ambulating;bedside commode Pt Will Perform Toileting - Clothing Manipulation and hygiene: with supervision;sit to/from stand Pt Will Perform Tub/Shower Transfer: Tub transfer;with min guard assist;ambulating;tub bench;rolling walker Additional ADL Goal #1: Pt will perform bed mobility with min assist with HOB flat and no rail as a precursor to ADL.  Plan Discharge plan remains appropriate    Co-evaluation                 End of Session     Activity Tolerance Treatment limited secondary to medical complications (Comment) (orthostatic)   Patient Left in chair;with call bell/phone within reach;with family/visitor present   Nurse Communication Mobility status;Precautions        Time: [id number] OT Time Calculation (min): 32 min  Charges: OT General Charges $OT Visit: 1 Procedure OT Treatments $Self Care/Home Management : 23-37 mins  Peri Maris 08/09/2013, 1:18 PM Pager: [phone]

## 2013-08-09 NOTE — Progress Notes (Signed)
[facility] Team 1 Consultant Note  Gary [name] [id number] DOB: 1932-08-17 DOA: 08/03/2013 PCP: [name], MD  Brief narrative: DOVBER SHALL is a 80 y.o. male who had undergone a right total hip arthoplasty began to have coffee ground emesis.  GI was called by the orthopedic team and medicine was consulted.  EGD showed esophagitis and ileus vs.gastroparesis. CT abdomen showed ileus.   Later developed severe diarrhea which was positive for C Diff. Became very dehydrated and developed non-oliguric ARF.  Subjective: Endorsing generalized malaise  Assessment/Plan: Principal Problem: Sepsis with hypotension -sepsis physiology improving-less tachycardic and BP stable but still soft -etiology C Diff -cont supportive care  Severe C diff colitis with ileus - started having loose stools 6/27 and noted to be C diff positive  -currently with rectal tube-decreased output past 24 hours - Per Dr Kelby Fam note from 05/12/13, this would be his 3rd episode but has had periods of quiescence between episodes (ie resolution) so no indication for Difcid at this time - switched oral vanc to vanc enemas as NG tube to suction due to ileus 6/28 but now XR markedly improved so will clamp NGT and if tolerates switch Vanco to PT and PR - Cont IV Flagyl   ARF on CKD - responding to aggressive IVFs-etiology likely 2/2 severe diarrhea - suspect further worsened by recent hypotension - cont to follow closely- improved tachycardia  - Hold bisoprolol and flomax until blood pressure stabilizes.    Acute Urinary retention - cont foley for now- hold voiding trial until blood pressure stabilizes.   Degenerative joint disease (DJD) of hip/S/P total hip arthroplasty -per primary team  Acute encephalopathy - resolving and likely due to improperly metabolized narcs and BZDs in setting of ARF as well as significant azotemia    Upper GI bleeding/ Acute esophagitis - EGD showing esophagitis  -  Hold PPI. Until patient's C. difficile resolved  Complex renal cyst, left  - MRI with and without contrast when more stable    Antibiotics: Anti-infectives   Start     Dose/Rate Route Frequency Ordered Stop   08/08/13 1500  vancomycin (VANCOCIN) 50 mg/mL oral solution 250 mg  Status:  Discontinued     250 mg Per Tube 4 times per day 08/08/13 1350 08/08/13 1919   08/08/13 1200  vancomycin (VANCOCIN) 500 mg in sodium chloride irrigation 0.9 % 100 mL ENEMA     500 mg Rectal 4 times per day 08/08/13 0858     08/07/13 1815  vancomycin (VANCOCIN) 50 mg/mL oral solution 500 mg  Status:  Discontinued     500 mg Per Tube 4 times per day 08/07/13 1809 08/08/13 0858   08/07/13 1815  metroNIDAZOLE (FLAGYL) IVPB 500 mg     500 mg 100 mL/hr over 60 Minutes Intravenous Every 8 hours 08/07/13 1809 08/21/13 1814   08/03/13 1900  ceFAZolin (ANCEF) IVPB 2 g/50 mL premix     2 g 100 mL/hr over 30 Minutes Intravenous  Once 08/03/13 1556 08/03/13 1826   08/03/13 1700  ceFAZolin (ANCEF) IVPB 2 g/50 mL premix  Status:  Discontinued     2 g 100 mL/hr over 30 Minutes Intravenous Every 6 hours 08/03/13 1525 08/03/13 1556   08/03/13 1530  vancomycin (VANCOCIN) IVPB 1000 mg/200 mL premix  Status:  Discontinued     1,000 mg 200 mL/hr over 60 Minutes Intravenous Every 12 hours 08/03/13 1525 08/03/13 1555   08/03/13 1115  vancomycin (VANCOCIN) IVPB 1000 mg/200 mL  premix     1,000 mg 200 mL/hr over 60 Minutes Intravenous To Surgery 08/03/13 1109 08/03/13 1121   08/03/13 0600  ceFAZolin (ANCEF) IVPB 2 g/50 mL premix     2 g 100 mL/hr over 30 Minutes Intravenous On call to O.R. 08/02/13 1243 08/03/13 1053     Objective: Filed Weights   08/03/13 1542 08/08/13 0438 08/09/13 0300  Weight: 206 lb (93.441 kg) 199 lb 11.8 oz (90.6 kg) 198 lb 6.6 oz (90 kg)    Vitals Filed Vitals:   08/08/13 2006 08/08/13 2345 08/09/13 0300 08/09/13 0400  BP: 110/57 99/55  102/54  Pulse: 102 104  99  Temp: 98.1 F (36.7 C)  98.2 F (36.8 C)  98.4 F (36.9 C)  TempSrc: Oral Oral  Oral  Resp: 25 27  23   Height:      Weight:   198 lb 6.6 oz (90 kg)   SpO2: 95% 96%  96%     Intake/Output Summary (Last 24 hours) at 08/09/13 1026 Last data filed at 08/09/13 1000  Gross per 24 hour  Intake 4484.59 ml  Output   3675 ml  Net 809.59 ml     Exam: General: awake and oriented, no distress Lungs: Clear to auscultation bilaterally without wheezes or crackles Cardiovascular: Regular rate and rhythm without murmur gallop or rub normal S1 and S2 Abdomen: Nontender, nondistended, soft, bowel sounds hypoactive, no rebound, no ascites, no appreciable mass- rectal tube with liquid stool-NGT with coffee ground returns diluted with water from ice chips Extremities: No significant cyanosis, clubbing, or edema  Data Reviewed: Basic Metabolic Panel:  Recent Labs Lab 08/06/13 0308 08/07/13 1406 08/08/13 0940 08/08/13 1819 08/09/13 0255  NA 137 137 140 139 140  K 4.5 3.8 3.5* 3.4* 3.7  CL 97 96 99 99 101  CO2 27 17* 17* 18* 19  GLUCOSE 204* 164* 176* 147* 178*  BUN 33* 80* 111* 123* 129*  CREATININE 1.46* 3.47* 4.91* 5.40* 5.39*  CALCIUM 8.8 8.1* 7.8* 7.6* 7.8*   Liver Function Tests:  Recent Labs Lab 08/05/13 1245 08/07/13 1406  AST 48* 32  ALT 12 16  ALKPHOS 34* 28*  BILITOT 1.2 0.8  PROT 5.5* 5.8*  ALBUMIN 2.8* 2.2*    Recent Labs Lab 08/05/13 1245  LIPASE 9*   No results found for this basename: AMMONIA,  in the last 168 hours CBC:  Recent Labs Lab 08/05/13 1245 08/05/13 2020 08/06/13 0308 08/07/13 1406 08/09/13 0255  WBC 8.7 3.0* 2.7* 3.2* 7.3  NEUTROABS  --   --   --  2.3  --   HGB 9.6* 10.1* 9.4* 9.6* 9.4*  HCT 28.9* 29.7* 28.4* 28.7* 27.2*  MCV 89.8 90.0 89.9 90.0 85.8  PLT 137* 138* 136* 179 216   Cardiac Enzymes: No results found for this basename: CKTOTAL, CKMB, CKMBINDEX, TROPONINI,  in the last 168 hours BNP (last 3 results) No results found for this basename: PROBNP,   in the last 8760 hours CBG: No results found for this basename: GLUCAP,  in the last 168 hours  Recent Results (from the past 240 hour(s))  CLOSTRIDIUM DIFFICILE BY PCR     Status: Abnormal   Collection Time    08/07/13  2:45 PM      Result Value Ref Range Status   C difficile by pcr POSITIVE (*) NEGATIVE Final   Comment: CRITICAL RESULT CALLED TO, READ BACK BY AND VERIFIED WITH:     Novella Rob RN AT [id number] 08/07/13 BY  [id number]     Studies:  Recent x-ray studies have been reviewed in detail by the Attending Physician  Scheduled Meds:  Scheduled Meds: . antiseptic oral rinse  15 mL Mouth Rinse BID  . chlorhexidine  15 mL Mouth Rinse BID  . fluticasone  2 spray Each Nare Daily  . metronidazole  500 mg Intravenous Q8H  . pantoprazole (PROTONIX) IV  40 mg Intravenous Q24H  . pneumococcal 23 valent vaccine  0.5 mL Intramuscular Tomorrow-1000  . vancomycin (VANCOCIN) rectal ENEMA  500 mg Rectal 4 times per day   Continuous Infusions: . sodium chloride    . dextrose 5 % 1,000 mL with potassium chloride 40 mEq, sodium bicarbonate 100 mEq infusion 125 mL/hr at 08/09/13 0800    Critical care time: 45 min   ELLIS,ALLISON L., ANP 08/09/2013, 10:26 AM  LOS: 6 days  Examined patient with ANP Ebony Hail discussed assessment and plan, agree with above plan. Answered all questions to patient and family concerning his plan of care. Patient with complex multiple medical problems greater than 45 minutes in direct patient care [email or web address]

## 2013-08-09 NOTE — Progress Notes (Signed)
Physical Therapy Treatment Patient Details Name: Gary [name] MRN: [id number] DOB: [birth year] Today's Date: 08/09/2013    History of Present Illness 80 yo male s/p Rt THA ( direct anterior). Pt developed post-op ileus  and underwent EDG 08/05/13. Pt currently with NG tube suction. PMHx- arthritis, anxiety, CAD (s/p stents), back surgery x 3, colectomy    PT Comments    Pt very willing to participate as much as he could with process of returning to bed, however hypotension made standing impossible. Pt groggy/lethargic throughout, however following commands to assist with pad placement for Maximove back to bed.  Follow Up Recommendations  SNF;Supervision/Assistance - 24 hour     Equipment Recommendations  None recommended by PT    Recommendations for Other Services       Precautions / Restrictions Precautions Precautions: Fall Precaution Comments: foley / flexiseal Restrictions RLE Weight Bearing: Weight bearing as tolerated    Mobility  Bed Mobility Overal bed mobility: Needs Assistance;+2 for physical assistance Bed Mobility: Rolling Rolling: Mod assist;+2 for physical assistance        General bed mobility comments: after maximove back to bed, assisted rolling both Lt and Rt with max cuing (verbal and tactile) with facilitation due to weakness/hypotension  Transfers Overall transfer level: Needs assistance Equipment used:  (maximove)           General transfer comment: On arrival, pt in recliner with feet elevated and RN in room. She reported twice putting pt's feet down to floor to initiate transfer back to bed and both times he became very hypotensive (SBP 60s). Nurse requested assist with back to bed and educated her on need for maximove. On return with lift, RN left the room to get supplies to give pt a saline bolus, while PT/tech educated pt in process and allowed pt to assist with positioning pad under pt (pulling forward to unsupported sitting with legs  elevated, rolling slightly to each side, and flexing each leg--all AAROM)  Ambulation/Gait                 Stairs            Wheelchair Mobility    Modified Rankin (Stroke Patients Only)          Cognition Arousal/Alertness: Lethargic Behavior During Therapy: Flat affect Overall Cognitive Status: Impaired/Different from baseline Area of Impairment: Attention;Problem solving   Current Attention Level: Sustained       Awareness: Intellectual Problem Solving: Slow processing;Decreased initiation;Requires tactile cues General Comments: Pt very hypotensive on arrival, certainly impacting his level of arousal and cognition    Exercises     General Comments General comments (skin integrity, edema, etc.):  once supine, BP improved, however pt remained very lethargic, cool and clammy and exercises deferred      Pertinent Vitals/Pain Per RN, SBP 60s with upright sitting with feet on floor (x2 prior to PT arrival). After return to supine via maximove, 113/70s. Pt denied pain throughout.    Home Living                      Prior Function            PT Goals (current goals can now be found in the care plan section) Acute Rehab PT Goals Patient Stated Goal: none stated this session Progress towards PT goals: Not progressing toward goals - comment (hypotensive, lethargic)    Frequency  Min 5X/week    PT Plan Current plan remains appropriate  Co-evaluation             End of Session   Activity Tolerance: Treatment limited secondary to medical complications (Comment) (hypotensive) Patient left: in bed;with call bell/phone within reach;with nursing/sitter in room;with family/visitor present     Time: 1402-1430 PT Time Calculation (min): 28 min  Charges:  $Therapeutic Activity: 23-37 mins                    G Codes:      SASSER,LYNN 08/18/2013, 2:50 PM Pager [phone]

## 2013-08-09 NOTE — Progress Notes (Signed)
    Progress Note   Subjective  discomfort when voiding (but has a cath)   Objective   Vital signs in last 24 hours: Temp:  [96.7 F (35.9 C)-98.4 F (36.9 C)] 98.4 F (36.9 C) (06/29 0400) Pulse Rate:  [99-118] 99 (06/29 0400) Resp:  [23-33] 23 (06/29 0400) BP: (90-110)/(48-57) 102/54 mmHg (06/29 0400) SpO2:  [94 %-97 %] 96 % (06/29 0400) Weight:  [198 lb 6.6 oz (90 kg)] 198 lb 6.6 oz (90 kg) (06/29 0300) Last BM Date: 08/08/13 General:    white male in NAD Heart:  Regular rate and rhythm Abdomen:  Soft, nontender and nondistended. A few bowel sounds (after pinching NGT off from suction) Extremities:  Without edema. Neurologic:  Alert and oriented,  grossly normal neurologically. Psych:  Cooperative.   Lab Results:  Recent Labs  08/07/13 1406 08/09/13 0255  WBC 3.2* 7.3  HGB 9.6* 9.4*  HCT 28.7* 27.2*  PLT 179 216   BMET  Recent Labs  08/08/13 0940 08/08/13 1819 08/09/13 0255  NA 140 139 140  K 3.5* 3.4* 3.7  CL 99 99 101  CO2 17* 18* 19  GLUCOSE 176* 147* 178*  BUN 111* 123* 129*  CREATININE 4.91* 5.40* 5.39*  CALCIUM 7.8* 7.6* 7.8*   LFT  Recent Labs  08/07/13 1406  PROT 5.8*  ALBUMIN 2.2*  AST 32  ALT 16  ALKPHOS 28*  BILITOT 0.8   Dg Abd Portable 1v  08/09/2013   CLINICAL DATA:  Ileus  EXAM: PORTABLE ABDOMEN - 1 VIEW  COMPARISON:  08/07/2013  FINDINGS: Scattered large and small bowel gas is noted. Postsurgical changes are again seen and stable. A nasogastric catheter remains coiled within the stomach. The degree of small bowel dilatation has improved in the interval.  IMPRESSION: Improving small bowel dilatation.  No acute abnormality is noted.   Electronically Signed   By: Inez Catalina M.D.   On: 08/09/2013 07:42     Assessment / Plan:    1. Ileus, probably post-op but then exacerbated by c-diff infection. He is improved radiographically and clinically. Taking ice chips. Will try to clamp NGT and see how he does.   2. Acute on chronic  renal failure, creatinine hovering around 5.39.   3. Esophagitis. Continue daily IV PPI, change to oral when taking PO  4. C-diff. Abdominal exam not overly concerning. Currently getting Vancomycin enemas and IV flagyl. Will change to oral when taking PO. WBC normal. He has definitely improved overnight.  Will need total of 14 days of antibiotics.      LOS: 6 days   Tye Savoy  08/09/2013, 9:00 AM   GI ATTENDING  Interval history and data reviewed. Case reviewed at morning report with GI colleagues. Patient personally seen and examined..Daughter in room. Agree with H&P as above. Patient with postop ileus and superimposed C. difficile. Has made some positive progress overnight. Abdomen mildly distended but not tender. Continue medical therapy without changes. We will follow.  Docia Chuck. Geri Seminole., M.D. [facility] of G[facility]enterology

## 2013-08-09 NOTE — Progress Notes (Signed)
Subjective: 4 Days Post-Op Procedure(s) (LRB): ESOPHAGOGASTRODUODENOSCOPY (EGD) (N/A) Status post right anterior THR Doing well from an orthopaedic standpoint. He is alert and oriented this morning. He is communicating clearly. He has a post op ileus, sepsis, ARF, and C-Diff. HE is currently receiving abx through medial team. NG tube and rectal tube in place.   Activity level:  wbat Voiding:  NGT adn rectal tube in place Patient reports pain as mild.    Objective: Vital signs in last 24 hours: Temp:  [96.7 F (35.9 C)-98.4 F (36.9 C)] 98.4 F (36.9 C) (06/29 0400) Pulse Rate:  [99-118] 99 (06/29 0400) Resp:  [23-33] 23 (06/29 0400) BP: (90-110)/(48-57) 102/54 mmHg (06/29 0400) SpO2:  [94 %-97 %] 96 % (06/29 0400) Weight:  [90 kg (198 lb 6.6 oz)] 90 kg (198 lb 6.6 oz) (06/29 0300)  Labs:  Recent Labs  08/07/13 1406 08/09/13 0255  HGB 9.6* 9.4*    Recent Labs  08/07/13 1406 08/09/13 0255  WBC 3.2* 7.3  RBC 3.19* 3.17*  HCT 28.7* 27.2*  PLT 179 216    Recent Labs  08/08/13 1819 08/09/13 0255  NA 139 140  K 3.4* 3.7  CL 99 101  CO2 18* 19  BUN 123* 129*  CREATININE 5.40* 5.39*  GLUCOSE 147* 178*  CALCIUM 7.6* 7.8*   No results found for this basename: LABPT, INR,  in the last 72 hours  Physical Exam:  Neurologically intact Neurovascular intact Sensation intact distally Intact pulses distally Dorsiflexion/Plantar flexion intact Incision: dressing C/D/I No cellulitis present Compartment soft  Assessment/Plan:  4 Days Post-Op Procedure(s) (LRB): ESOPHAGOGASTRODUODENOSCOPY (EGD) (N/A) Up with therapy as tolerated and cleared by medical team SCD's for DVT prophylaxis with no anticoagulation due to GI issues. D/C home with H/H vs SNF pending progress/mobility per medical team Continue current pain regimen.  Medicine managing sepsis, C-Diff with ABX and rectal tube.Post-op ilius with NG tube in place. ARF, pre-renal, currently receiving fluids.  Hypotension, currently receiving fluids.       NIDA, Larwance Sachs 08/09/2013, 8:57 AM

## 2013-08-10 DIAGNOSIS — A419 Sepsis, unspecified organism: Secondary | ICD-10-CM

## 2013-08-10 DIAGNOSIS — R338 Other retention of urine: Secondary | ICD-10-CM

## 2013-08-10 DIAGNOSIS — N189 Chronic kidney disease, unspecified: Secondary | ICD-10-CM

## 2013-08-10 DIAGNOSIS — M1610 Unilateral primary osteoarthritis, unspecified hip: Secondary | ICD-10-CM

## 2013-08-10 DIAGNOSIS — M169 Osteoarthritis of hip, unspecified: Secondary | ICD-10-CM

## 2013-08-10 LAB — BASIC METABOLIC PANEL
BUN: 121 mg/dL — AB (ref 6–23)
CO2: 26 mEq/L (ref 19–32)
Calcium: 7.9 mg/dL — ABNORMAL LOW (ref 8.4–10.5)
Chloride: 103 mEq/L (ref 96–112)
Creatinine, Ser: 4.44 mg/dL — ABNORMAL HIGH (ref 0.50–1.35)
GFR, EST AFRICAN AMERICAN: 13 mL/min — AB (ref >90)
GFR, EST NON AFRICAN AMERICAN: 11 mL/min — AB (ref >90)
Glucose, Bld: 178 mg/dL — ABNORMAL HIGH (ref 70–99)
Potassium: 3.7 mEq/L (ref 3.7–5.3)
SODIUM: 145 meq/L (ref 137–147)

## 2013-08-10 LAB — URINALYSIS, ROUTINE W REFLEX MICROSCOPIC
Bilirubin Urine: NEGATIVE
GLUCOSE, UA: NEGATIVE mg/dL
KETONES UR: NEGATIVE mg/dL
Leukocytes, UA: NEGATIVE
Nitrite: NEGATIVE
Protein, ur: 30 mg/dL — AB
Specific Gravity, Urine: 1.021 (ref 1.005–1.030)
Urobilinogen, UA: 0.2 mg/dL (ref 0.0–1.0)
pH: 5 (ref 5.0–8.0)

## 2013-08-10 LAB — CBC
HCT: 31 % — ABNORMAL LOW (ref 39.0–52.0)
Hemoglobin: 10.4 g/dL — ABNORMAL LOW (ref 13.0–17.0)
MCH: 29.5 pg (ref 26.0–34.0)
MCHC: 33.5 g/dL (ref 30.0–36.0)
MCV: 87.8 fL (ref 78.0–100.0)
Platelets: 241 10*3/uL (ref 150–400)
RBC: 3.53 MIL/uL — ABNORMAL LOW (ref 4.22–5.81)
RDW: 14.7 % (ref 11.5–15.5)
WBC: 14 10*3/uL — ABNORMAL HIGH (ref 4.0–10.5)

## 2013-08-10 LAB — URINE MICROSCOPIC-ADD ON

## 2013-08-10 LAB — SODIUM, URINE, RANDOM: Sodium, Ur: 25 mEq/L

## 2013-08-10 MED ORDER — DEXTROSE-NACL 5-0.45 % IV SOLN
INTRAVENOUS | Status: AC
  Administered 2013-08-10: 09:00:00 via INTRAVENOUS

## 2013-08-10 MED ORDER — FAMOTIDINE IN NACL 20-0.9 MG/50ML-% IV SOLN
20.0000 mg | Freq: Every day | INTRAVENOUS | Status: DC
  Administered 2013-08-10 – 2013-08-11 (×2): 20 mg via INTRAVENOUS
  Filled 2013-08-10 (×3): qty 50

## 2013-08-10 MED ORDER — VANCOMYCIN 50 MG/ML ORAL SOLUTION
125.0000 mg | Freq: Four times a day (QID) | ORAL | Status: DC
  Administered 2013-08-10 – 2013-08-13 (×13): 125 mg via ORAL
  Filled 2013-08-10 (×16): qty 2.5

## 2013-08-10 MED ORDER — MORPHINE SULFATE 2 MG/ML IJ SOLN
1.0000 mg | INTRAMUSCULAR | Status: DC | PRN
  Administered 2013-08-10: 1 mg via INTRAVENOUS
  Filled 2013-08-10 (×2): qty 1

## 2013-08-10 NOTE — Progress Notes (Signed)
[facility] Team 1 Consultant Note  Gary [name] [id number] DOB: [birth year] DOA: 08/03/2013 PCP: [name], MD  Brief narrative: Gary [name] is a 80 y.o. male who had undergone a right total hip arthoplasty began to have coffee ground emesis.  GI was called by the orthopedic team and medicine was consulted.  EGD showed esophagitis and ileus vs.gastroparesis. CT abdomen showed ileus.   Later developed severe diarrhea which was positive for C Diff. Became very dehydrated and developed non-oliguric ARF.  Subjective: Endorsing thirst, very mild nausea- denies abdominal pain or bloating  Assessment/Plan: Principal Problem: Sepsis with hypotension -sepsis physiology improving-less tachycardic and BP stable but still soft -WBC have increased from low to now elevated (appropriate inflammatory response) -etiology C Diff -cont supportive care  Severe C diff colitis with ileus - started having loose stools 6/27 and noted to be C diff positive  -rectal tube with watery output- baseline has frequent mushy stool due to prior sub total colectomy - Per Dr Kelby Fam note from 05/12/13, this would be his 3rd episode but has had periods of quiescence between episodes (ie resolution) so no indication for Difcid at this time - switched oral vanc to vanc enemas as NG tube to suction due to ileus 6/28 but abd XR 6/29 demonstrated marked improvement so NGT was clamped on 6/29 and he tolerated x 24 hrs so will switch Vanco to PO and dc PR Vanco - Cont IV Flagyl -allow sips clears   ARF on CKD - responding to aggressive IVFs - etiology likely 2/2 severe diarrhea - suspect further worsened by recent hypotension - cont to follow closely- improved tachycardia  - Hold bisoprolol and flomax until blood pressure stabilizes.  - dc bicarbonate drip but cont dextrose containing IVFs since hyperNa+   Acute Urinary retention - cont foley for now- hold voiding trial until blood pressure  stabilizes.   Degenerative joint disease (DJD) of hip/S/P total hip arthroplasty -per primary team  Acute encephalopathy - resolving and likely due to improperly metabolized narcs and BZDs in setting of ARF as well as significant azotemia    Upper GI bleeding/ Acute esophagitis - EGD showing esophagitis  - Hold PPI. Until patient's C. difficile resolved  Complex renal cyst, left  - MRI with and without contrast when more stable    Antibiotics: Anti-infectives   Start     Dose/Rate Route Frequency Ordered Stop   08/10/13 1200  vancomycin (VANCOCIN) 50 mg/mL oral solution 125 mg     125 mg Oral 4 times per day 08/10/13 0919     08/08/13 1500  vancomycin (VANCOCIN) 50 mg/mL oral solution 250 mg  Status:  Discontinued     250 mg Per Tube 4 times per day 08/08/13 1350 08/08/13 1919   08/08/13 1200  vancomycin (VANCOCIN) 500 mg in sodium chloride irrigation 0.9 % 100 mL ENEMA  Status:  Discontinued     500 mg Rectal 4 times per day 08/08/13 0858 08/10/13 0919   08/07/13 1815  vancomycin (VANCOCIN) 50 mg/mL oral solution 500 mg  Status:  Discontinued     500 mg Per Tube 4 times per day 08/07/13 1809 08/08/13 0858   08/07/13 1815  metroNIDAZOLE (FLAGYL) IVPB 500 mg     500 mg 100 mL/hr over 60 Minutes Intravenous Every 8 hours 08/07/13 1809 08/21/13 1814   08/03/13 1900  ceFAZolin (ANCEF) IVPB 2 g/50 mL premix     2 g 100 mL/hr over 30 Minutes Intravenous  Once  08/03/13 1556 08/03/13 1826   08/03/13 1700  ceFAZolin (ANCEF) IVPB 2 g/50 mL premix  Status:  Discontinued     2 g 100 mL/hr over 30 Minutes Intravenous Every 6 hours 08/03/13 1525 08/03/13 1556   08/03/13 1530  vancomycin (VANCOCIN) IVPB 1000 mg/200 mL premix  Status:  Discontinued     1,000 mg 200 mL/hr over 60 Minutes Intravenous Every 12 hours 08/03/13 1525 08/03/13 1555   08/03/13 1115  vancomycin (VANCOCIN) IVPB 1000 mg/200 mL premix     1,000 mg 200 mL/hr over 60 Minutes Intravenous To Surgery 08/03/13 1109 08/03/13  1121   08/03/13 0600  ceFAZolin (ANCEF) IVPB 2 g/50 mL premix     2 g 100 mL/hr over 30 Minutes Intravenous On call to O.R. 08/02/13 1243 08/03/13 1053     Objective: Filed Weights   08/03/13 1542 08/08/13 0438 08/09/13 0300  Weight: 206 lb (93.441 kg) 199 lb 11.8 oz (90.6 kg) 198 lb 6.6 oz (90 kg)    Vitals Filed Vitals:   08/09/13 2000 08/10/13 0000 08/10/13 0405 08/10/13 0728  BP: 114/58 109/56 117/61 105/49  Pulse: 99 104 105 107  Temp:  98.3 F (36.8 C) 98.5 F (36.9 C) 97.4 F (36.3 C)  TempSrc:  Oral Oral Oral  Resp: 25 25 20 21   Height:      Weight:      SpO2: 98% 95% 97% 96%     Intake/Output Summary (Last 24 hours) at 08/10/13 1016 Last data filed at 08/10/13 0406  Gross per 24 hour  Intake   1550 ml  Output   3575 ml  Net  -2025 ml     Exam: General: awake and oriented, no distress Lungs: Clear to auscultation bilaterally without wheezes or crackles Cardiovascular: Regular rate and rhythm without murmur gallop or rub normal S1 and S2 Abdomen: Nontender, nondistended, soft, bowel sounds hypoactive, no rebound, no ascites, no appreciable mass- rectal tube with liquid stool-NGT clamped Extremities: No significant cyanosis, clubbing, or edema  Data Reviewed: Basic Metabolic Panel:  Recent Labs Lab 08/07/13 1406 08/08/13 0940 08/08/13 1819 08/09/13 0255 08/10/13 0426  NA 137 140 139 140 145  K 3.8 3.5* 3.4* 3.7 3.7  CL 96 99 99 101 103  CO2 17* 17* 18* 19 26  GLUCOSE 164* 176* 147* 178* 178*  BUN 80* 111* 123* 129* 121*  CREATININE 3.47* 4.91* 5.40* 5.39* 4.44*  CALCIUM 8.1* 7.8* 7.6* 7.8* 7.9*   Liver Function Tests:  Recent Labs Lab 08/05/13 1245 08/07/13 1406  AST 48* 32  ALT 12 16  ALKPHOS 34* 28*  BILITOT 1.2 0.8  PROT 5.5* 5.8*  ALBUMIN 2.8* 2.2*    Recent Labs Lab 08/05/13 1245  LIPASE 9*   No results found for this basename: AMMONIA,  in the last 168 hours CBC:  Recent Labs Lab 08/05/13 2020 08/06/13 0308  08/07/13 1406 08/09/13 0255 08/10/13 0426  WBC 3.0* 2.7* 3.2* 7.3 14.0*  NEUTROABS  --   --  2.3  --   --   HGB 10.1* 9.4* 9.6* 9.4* 10.4*  HCT 29.7* 28.4* 28.7* 27.2* 31.0*  MCV 90.0 89.9 90.0 85.8 87.8  PLT 138* 136* 179 216 241   Cardiac Enzymes: No results found for this basename: CKTOTAL, CKMB, CKMBINDEX, TROPONINI,  in the last 168 hours BNP (last 3 results) No results found for this basename: PROBNP,  in the last 8760 hours CBG: No results found for this basename: GLUCAP,  in the last 168 hours  Recent Results (from the past 240 hour(s))  CLOSTRIDIUM DIFFICILE BY PCR     Status: Abnormal   Collection Time    08/07/13  2:45 PM      Result Value Ref Range Status   C difficile by pcr POSITIVE (*) NEGATIVE Final   Comment: CRITICAL RESULT CALLED TO, READ BACK BY AND VERIFIED WITH:     Novella Rob RN AT 1804 08/07/13 BY WOOLLENK     Studies:  Recent x-ray studies have been reviewed in detail by the Attending Physician  Scheduled Meds:  Scheduled Meds: . antiseptic oral rinse  15 mL Mouth Rinse BID  . chlorhexidine  15 mL Mouth Rinse BID  . famotidine (PEPCID) IV  20 mg Intravenous Daily  . fluticasone  2 spray Each Nare Daily  . metronidazole  500 mg Intravenous Q8H  . vancomycin  125 mg Oral 4 times per day   Continuous Infusions: . dextrose 5 % and 0.45% NaCl 125 mL/hr at 08/10/13 0921    Critical care time: 45 min   ELLIS,ALLISON L., ANP 08/10/2013, 10:16 AM  LOS: 7 days   [email or web address]  Examining patient and discussed assessment and plan with ANP Ebony Hail and agree with the above plan. Patient with complex multiple medical problems, which required greater than 45 minutes in direct medical care. Answered all patient's and family's questions concerning plan of care

## 2013-08-10 NOTE — Progress Notes (Signed)
Physical Therapy Treatment Patient Details Name: Gary [name] MRN: [id number] DOB: 04-25-1932 Today's Date: 08/10/2013    History of Present Illness 80 yo male s/p Rt THA ( direct anterior). Pt developed post-op ileus  and underwent EDG 08/05/13. Pt currently with NG tube suction. PMHx- arthritis, anxiety, CAD (s/p stents), back surgery x 3, colectomy    PT Comments    Progressing with sitting tolerance with BP rising after seated several minutes, however continued c/o symptoms so returned to supine.  Patient and wife hopeful with improvements today.  Will continue per plan till stable for d/c to SNF.  Follow Up Recommendations  SNF;Supervision/Assistance - 24 hour     Equipment Recommendations  None recommended by PT    Recommendations for Other Services       Precautions / Restrictions Precautions Precautions: Fall Precaution Comments: foley / flexiseal Restrictions RLE Weight Bearing: Weight bearing as tolerated    Mobility  Bed Mobility Overal bed mobility: +2 for physical assistance;Needs Assistance Bed Mobility: Supine to Sit;Sit to Supine     Supine to sit: +2 for physical assistance;Max assist Sit to supine: Mod assist;+2 for physical assistance   General bed mobility comments: assist to scoot hips with cues; assist for legs off bed and to lift trunk upright; to supine assist for legs in bed  Transfers                 General transfer comment: NT due to BP low seated EOB  Ambulation/Gait                 Stairs            Wheelchair Mobility    Modified Rankin (Stroke Patients Only)       Balance     Sitting balance-Leahy Scale: Fair Sitting balance - Comments: sat edge of bed x about 6 minutes participated in sitting balance activities leaning and recovery without UE assist and reaching                            Cognition Arousal/Alertness: Awake/alert Behavior During Therapy: WFL for tasks  assessed/performed Overall Cognitive Status: Within Functional Limits for tasks assessed                      Exercises Total Joint Exercises Ankle Circles/Pumps: AROM;10 reps;Supine;Both;Seated;15 reps Short Arc Quad: AROM;Right;10 reps;Supine Heel Slides: AAROM;AROM;Both;10 reps;Supine Hip ABduction/ADduction: AAROM;AROM;10 reps;Both;Supine Long Arc Quad: AROM;Both;Seated;10 reps    General Comments        Pertinent Vitals/Pain BP 90/41 seated, 110/53 seated 3 minutes after LE therex HR max 123    Home Living                      Prior Function            PT Goals (current goals can now be found in the care plan section) Progress towards PT goals: Progressing toward goals    Frequency  Min 5X/week    PT Plan Current plan remains appropriate    Co-evaluation             End of Session   Activity Tolerance: Treatment limited secondary to medical complications (Comment) (OOB not attempted due to low BP and c/o lightheaded) Patient left: in bed;with call bell/phone within reach;with family/visitor present     Time: 1533-1600 PT Time Calculation (min): 27 min  Charges:  $Therapeutic Exercise: 8-22  mins $Therapeutic Activity: 8-22 mins                    G Codes:      WYNN,CYNDI 08/24/13, 4:34 PM Magda Kiel, [place] 08-24-2013

## 2013-08-10 NOTE — Progress Notes (Addendum)
Clinical Social Work Department CLINICAL SOCIAL WORK PLACEMENT NOTE 08/10/2013  Patient:  Gary [name], Gary [name]  Account Number:  [id number] Admit date:  08/03/2013  Clinical Social Worker:  Ky Barban, Latanya Presser  Date/time:  08/10/2013 10:55 AM  Clinical Social Work is seeking post-discharge placement for this patient at the following level of care:   [place]   (*CSW will update this form in Epic as items are completed)   N/A-CSW made referrals to all SNFs in [facility] and will provide wife with condensed list  Patient/family provided with [facility] Department of Clinical Social Work's list of facilities offering this level of care within the geographic area requested by the patient (or if unable, by the patient's family).  08/10/2013  Patient/family informed of their freedom to choose among providers that offer the needed level of care, that participate in Medicare, Medicaid or managed care program needed by the patient, have an available bed and are willing to accept the patient.  N/A-not sent to this facility  Patient/family informed of [facility]' ownership interest in [facility], as well as of the fact that they are under no obligation to receive care at this facility.  PASARR submitted to EDS on EXISTING PASARR number received on EXISTING  FL2 transmitted to all facilities in geographic area requested by pt/family on  08/10/2013 FL2 transmitted to all facilities within larger geographic area on   Patient informed that his/her managed care company has contracts with or will negotiate with  certain facilities, including the following:     Patient/family informed of bed offers received:  08/11/13 Patient chooses bed at [facility] recommends and patient chooses bed at    Patient to be transferred to [facility] on   Patient to be transferred to facility by  Patient and family notified of transfer on  Name of family member notified:    The  following physician request were entered in Epic:   Additional Comments: CSW provided list of bed offers to pt and wife in room. Wife calling pt's sister and their neighbor, who are both nurses, for recommendation concerning SNF placement for short-term rehab. CSW following and will facilitate discharge once family makes bed choice and pt is deemed medically ready for discharge.   Ky Barban, MSW, [facility] Clinical Social Worker [phone]

## 2013-08-10 NOTE — Progress Notes (Signed)
Daily Rounding Note  08/10/2013, 8:54 AM  LOS: 7 days   SUBJECTIVE:        BP still soft and mildly tachy. Stool output: 2300 ml yesterday. NGT clamped yesterday AM.  A small amount of nausea.  Urine output 1275 ml yesterday.    OBJECTIVE:         Vital signs in last 24 hours:    Temp:  [97.4 F (36.3 C)-98.5 F (36.9 C)] 97.4 F (36.3 C) (06/30 0728) Pulse Rate:  [99-107] 107 (06/30 0728) Resp:  [20-26] 21 (06/30 0728) BP: (72-117)/(41-62) 105/49 mmHg (06/30 0728) SpO2:  [95 %-98 %] 96 % (06/30 0728) Last BM Date: 08/09/13 General: Pale, looks unwell.    Heart: RRR Chest: clear bil.  No cough. No labored breathing. Abdomen: soft, NT, not distended.  BS hypoactive.  Extremities: no CCE Neuro/Psych:  Oriented x 3.  Alert and engaged.   Intake/Output from previous day: 06/29 0701 - 06/30 0700 In: 2050 [I.V.:1750; IV Piggyback:300] Out: 3575 [Urine:1275; Stool:2300]  Intake/Output this shift:    Lab Results:  Recent Labs  08/07/13 1406 08/09/13 0255 08/10/13 0426  WBC 3.2* 7.3 14.0*  HGB 9.6* 9.4* 10.4*  HCT 28.7* 27.2* 31.0*  PLT 179 216 241   BMET  Recent Labs  08/08/13 1819 08/09/13 0255 08/10/13 0426  NA 139 140 145  K 3.4* 3.7 3.7  CL 99 101 103  CO2 18* 19 26  GLUCOSE 147* 178* 178*  BUN 123* 129* 121*  CREATININE 5.40* 5.39* 4.44*  CALCIUM 7.6* 7.8* 7.9*   LFT  Recent Labs  08/07/13 1406  PROT 5.8*  ALBUMIN 2.2*  AST 32  ALT 16  ALKPHOS 28*  BILITOT 0.8   Studies/Results: Dg Abd Portable 1v 08/09/2013   COMPARISON:  08/07/2013  FINDINGS: Scattered large and small bowel gas is noted. Postsurgical changes are again seen and stable. A nasogastric catheter remains coiled within the stomach. The degree of small bowel dilatation has improved in the interval.  IMPRESSION: Improving small bowel dilatation.  No acute abnormality is noted.   Electronically Signed   By: Inez Catalina  M.D.   On: 08/09/2013 07:42   Scheduled Meds: . antiseptic oral rinse  15 mL Mouth Rinse BID  . chlorhexidine  15 mL Mouth Rinse BID  . fluticasone  2 spray Each Nare Daily  . metronidazole  500 mg Intravenous Q8H  . pneumococcal 23 valent vaccine  0.5 mL Intramuscular Tomorrow-1000  . vancomycin (VANCOCIN) rectal ENEMA  500 mg Rectal 4 times per day   Continuous Infusions: . sodium chloride    . dextrose 5 % 1,000 mL with potassium chloride 40 mEq, sodium bicarbonate 100 mEq infusion 125 mL/hr at 08/10/13 0519   PRN Meds:.acetaminophen, acetaminophen, haloperidol lactate, nitroGLYCERIN, ondansetron (ZOFRAN) IV, ondansetron, sodium chloride   ASSESMENT:   *  Post op Ileus with superimposed C diff Treatment includes Vanc enema, IV Flagyl.  Previous partial colon resection for diverticulitis and previous hemorrhoidal banding.  WBCs rising. No fever.  BP still soft and mildly tachy.  hospitalist is pulling NGT.  Leave the flexseal in place.   *  Esophagitis.  EGD 6/25. No PPI PTA.  IV Protonix discontinued on 6/29  *  S/p right THR 08/03/13.  *  ARF.  Improving.   *  Hyperglycemia, no previous hx DM.       PLAN   *  Add IV Pepcid for now vs  Protonix (PPI a/w increased incidence of C diff)  *  Go to po Vanc, pull NGT, start clears as ordered.      Gary [name]  08/10/2013, 8:54 AM Pager: [phone]  GI ATTENDING  Interval history data reviewed. Patient personally seen and examined. Family in room. Agree with H&P as above. Continues with significant clinical improvement from GI standpoint. NG tube out. Tolerated liquids. No abdominal pain. Abdominal exam benign. Continue to increase activity. He will need by mouth vancomycin for 2 weeks minimum. We'll follow.  Docia Chuck. Geri Seminole., M.D. [facility] Division of Gastroenterology

## 2013-08-10 NOTE — Progress Notes (Signed)
Clinical Social Work Department BRIEF PSYCHOSOCIAL ASSESSMENT 08/10/2013  Patient:  Gary [name], Gary [name]     Account Number:  [id number]     Admit date:  08/03/2013  Clinical Social Worker:  Gary [name]  Date/Time:  08/10/2013 10:51 AM  Referred by:  Physician  Date Referred:  08/10/2013 Referred for  SNF Placement   Other Referral:   Interview type:  Family Other interview type:    PSYCHOSOCIAL DATA Living Status:  FAMILY Admitted from facility:   Level of care:   Primary support name:  Gary [name]" [place] [phone]) Primary support relationship to patient:  SPOUSE Degree of support available:   Good--pt lives with spouse and has support from daughter.    CURRENT CONCERNS Current Concerns  Post-Acute Placement   Other Concerns:    SOCIAL WORK ASSESSMENT / PLAN CSW called pt's wife and explained recommendation for short-term rehab. CSW answered wife's questions and received permission to make referrals to all SNFs in [facility]. CSW will follow up with wife once facilities respond to request.   Assessment/plan status:  Psychosocial Support/Ongoing Assessment of Needs Other assessment/ plan:   Information/referral to community resources:   SNF    PATIENT'S/FAMILY'S RESPONSE TO PLAN OF CARE: Good--wife participated in conversation and thanked CSW for assistance.       Gary [name], MSW, [facility] Clinical Social Worker [phone]

## 2013-08-10 NOTE — Progress Notes (Signed)
I spoke with [facility] onsite reviewer and they will not approve pt for inpt rehab admission nor do therapy recommend pt for inpt rehab. SNF is recommended. We will hold on formal consult. [id number] I have alerted SW. Of our recommendation.

## 2013-08-10 NOTE — Progress Notes (Signed)
Subjective: 5 Days Post-Op Procedure(s) (LRB): ESOPHAGOGASTRODUODENOSCOPY (EGD) (N/A)  Activity level:  bedrest Diet tolerance:  Clears now Voiding:  Well urine, rectal tube in place Patient reports pain as mild.    Objective: Vital signs in last 24 hours: Temp:  [94.8 F (34.9 C)-98.5 F (36.9 C)] 94.8 F (34.9 C) (06/30 1132) Pulse Rate:  [97-107] 97 (06/30 1132) Resp:  [20-26] 23 (06/30 1132) BP: (104-117)/(49-62) 116/58 mmHg (06/30 1132) SpO2:  [95 %-98 %] 96 % (06/30 1132)  Labs:  Recent Labs  08/07/13 1406 08/09/13 0255 08/10/13 0426  HGB 9.6* 9.4* 10.4*    Recent Labs  08/09/13 0255 08/10/13 0426  WBC 7.3 14.0*  RBC 3.17* 3.53*  HCT 27.2* 31.0*  PLT 216 241    Recent Labs  08/09/13 0255 08/10/13 0426  NA 140 145  K 3.7 3.7  CL 101 103  CO2 19 26  BUN 129* 121*  CREATININE 5.39* 4.44*  GLUCOSE 178* 178*  CALCIUM 7.8* 7.9*   No results found for this basename: LABPT, INR,  in the last 72 hours  Physical Exam:  Neurologically intact ABD soft Sensation intact distally Intact pulses distally Dorsiflexion/Plantar flexion intact Compartment soft  Assessment/Plan:  5 Days Post-Op Procedure(s) (LRB): ESOPHAGOGASTRODUODENOSCOPY (EGD) (N/A) Continue antibiotics per enema for C diff  Hopefully to floor and rehab soon Appreciate medical help    DALLDORF,PETER G 08/10/2013, 12:34 PM

## 2013-08-11 DIAGNOSIS — D62 Acute posthemorrhagic anemia: Secondary | ICD-10-CM

## 2013-08-11 LAB — CBC
HEMATOCRIT: 30 % — AB (ref 39.0–52.0)
Hemoglobin: 10.2 g/dL — ABNORMAL LOW (ref 13.0–17.0)
MCH: 29.7 pg (ref 26.0–34.0)
MCHC: 34 g/dL (ref 30.0–36.0)
MCV: 87.5 fL (ref 78.0–100.0)
Platelets: 283 10*3/uL (ref 150–400)
RBC: 3.43 MIL/uL — ABNORMAL LOW (ref 4.22–5.81)
RDW: 14.9 % (ref 11.5–15.5)
WBC: 19.5 10*3/uL — ABNORMAL HIGH (ref 4.0–10.5)

## 2013-08-11 LAB — DIFFERENTIAL
Basophils Absolute: 0.2 10*3/uL — ABNORMAL HIGH (ref 0.0–0.1)
Basophils Relative: 1 % (ref 0–1)
EOS PCT: 1 % (ref 0–5)
Eosinophils Absolute: 0.3 10*3/uL (ref 0.0–0.7)
LYMPHS ABS: 1.1 10*3/uL (ref 0.7–4.0)
Lymphocytes Relative: 6 % — ABNORMAL LOW (ref 12–46)
Monocytes Absolute: 0.3 10*3/uL (ref 0.1–1.0)
Monocytes Relative: 1 % — ABNORMAL LOW (ref 3–12)
NEUTROS PCT: 91 % — AB (ref 43–77)
Neutro Abs: 19.4 10*3/uL — ABNORMAL HIGH (ref 1.7–7.7)

## 2013-08-11 LAB — BASIC METABOLIC PANEL
Anion gap: 17 — ABNORMAL HIGH (ref 5–15)
BUN: 100 mg/dL — ABNORMAL HIGH (ref 6–23)
CO2: 23 mEq/L (ref 19–32)
CREATININE: 3.17 mg/dL — AB (ref 0.50–1.35)
Calcium: 7.5 mg/dL — ABNORMAL LOW (ref 8.4–10.5)
Chloride: 98 mEq/L (ref 96–112)
GFR calc Af Amer: 20 mL/min — ABNORMAL LOW (ref >90)
GFR, EST NON AFRICAN AMERICAN: 17 mL/min — AB (ref >90)
GLUCOSE: 186 mg/dL — AB (ref 70–99)
POTASSIUM: 3.1 meq/L — AB (ref 3.7–5.3)
Sodium: 138 mEq/L (ref 137–147)

## 2013-08-11 MED ORDER — BOOST / RESOURCE BREEZE PO LIQD
1.0000 | Freq: Three times a day (TID) | ORAL | Status: DC
  Administered 2013-08-11 – 2013-08-17 (×18): 1 via ORAL

## 2013-08-11 NOTE — Progress Notes (Signed)
Subjective: 6 Days Post-Op Procedure(s) (LRB): ESOPHAGOGASTRODUODENOSCOPY (EGD) (N/A) S/P right anterior THR Doing well for orthopaedic standpoint. He is alert and oriented. Currently being treated for C-diff with vanc enemas and flagyl.     Activity level:  Bed rest Diet tolerance:  clears Voiding:  Rectal tube in place Patient reports pain as mild.    Objective: Vital signs in last 24 hours: Temp:  [94.8 F (34.9 C)-98.3 F (36.8 C)] 98.3 F (36.8 C) (07/01 0738) Pulse Rate:  [90-104] 97 (07/01 0738) Resp:  [18-26] 26 (07/01 0738) BP: (106-124)/(55-59) 106/55 mmHg (07/01 0738) SpO2:  [93 %-97 %] 93 % (07/01 0738) Weight:  [89.3 kg (196 lb 13.9 oz)] 89.3 kg (196 lb 13.9 oz) (07/01 0400)  Labs:  Recent Labs  08/09/13 0255 08/10/13 0426 08/11/13 0457  HGB 9.4* 10.4* 10.2*    Recent Labs  08/10/13 0426 08/11/13 0457  WBC 14.0* 19.5*  RBC 3.53* 3.43*  HCT 31.0* 30.0*  PLT 241 283    Recent Labs  08/09/13 0255 08/10/13 0426  NA 140 145  K 3.7 3.7  CL 101 103  CO2 19 26  BUN 129* 121*  CREATININE 5.39* 4.44*  GLUCOSE 178* 178*  CALCIUM 7.8* 7.9*   No results found for this basename: LABPT, INR,  in the last 72 hours  Physical Exam:  Neurologically intact Neurovascular intact Sensation intact distally Intact pulses distally Dorsiflexion/Plantar flexion intact Incision: dressing C/D/I No cellulitis present Compartment soft  Assessment/Plan:  6 Days Post-Op Procedure(s) (LRB): ESOPHAGOGASTRODUODENOSCOPY (EGD) (N/A) Up with therapy Discharge to SNF when cleared by medical team.  Currently being treated for C-diff with vanc enema and flagyl.  SCD's for DVT prophylaxis with no anticoagulation due to GI issues Continue current pain regimen. Medicine managing sepsis, C-Diff with ABX and rectal tube.Post-op ilius imporoving. ARF, pre-renal, currently receiving fluids. Hypotension, currently receiving fluids. Appreciate medical input.      Jacori Mulrooney,  Larwance Sachs 08/11/2013, 8:38 AM

## 2013-08-11 NOTE — Progress Notes (Signed)
Daily Rounding Note  08/11/2013, 9:12 AM  LOS: 8 days   SUBJECTIVE:       Feels better.  No nausea.  Tolerating clears.  Stools brown and liquid in the flexiseal. Got 1 mg IV Morphin yesterday for hip pain, in addition to extra strength tylenol.   OBJECTIVE:         Vital signs in last 24 hours:    Temp:  [94.8 F (34.9 C)-98.3 F (36.8 C)] 98.3 F (36.8 C) (07/01 0738) Pulse Rate:  [90-104] 97 (07/01 0738) Resp:  [18-26] 26 (07/01 0738) BP: (106-124)/(55-59) 106/55 mmHg (07/01 0738) SpO2:  [93 %-97 %] 93 % (07/01 0738) Weight:  [89.3 kg (196 lb 13.9 oz)] 89.3 kg (196 lb 13.9 oz) (07/01 0400) Last BM Date: 08/10/13 General: looks stronger, less diaphoretic   Heart: RRR Chest: clear bil.  Abdomen: softer, less protuberant, not tender.  BS hypoactive.   Extremities: no CCE.  Some superficial skin breakdown in small area at the right hip scar, staple still intact. Neuro/Psych:  Oriented x 3, engaged, relaxed.    Intake/Output from previous day: 06/30 0701 - 07/01 0700 In: 3135 [P.O.:360; I.V.:1625; IV Piggyback:350] Out: 2550 [Urine:750; Stool:1800]  Intake/Output this shift:    Lab Results:  Recent Labs  08/09/13 0255 08/10/13 0426 08/11/13 0457  WBC 7.3 14.0* 19.5*  HGB 9.4* 10.4* 10.2*  HCT 27.2* 31.0* 30.0*  PLT 216 241 283   BMET  Recent Labs  08/08/13 1819 08/09/13 0255 08/10/13 0426  NA 139 140 145  K 3.4* 3.7 3.7  CL 99 101 103  CO2 18* 19 26  GLUCOSE 147* 178* 178*  BUN 123* 129* 121*  CREATININE 5.40* 5.39* 4.44*  CALCIUM 7.6* 7.8* 7.9*    Scheduled Meds: . antiseptic oral rinse  15 mL Mouth Rinse BID  . chlorhexidine  15 mL Mouth Rinse BID  . famotidine (PEPCID) IV  20 mg Intravenous Daily  . feeding supplement (RESOURCE BREEZE)  1 Container Oral TID BM  . fluticasone  2 spray Each Nare Daily  . metronidazole  500 mg Intravenous Q8H  . vancomycin  125 mg Oral 4 times per day    Continuous Infusions:  PRN Meds:.acetaminophen, acetaminophen, haloperidol lactate, morphine injection, nitroGLYCERIN, ondansetron (ZOFRAN) IV, ondansetron, sodium chloride   ASSESMENT:   * Post op Ileus with superimposed C diff  Treatment includes Vanc enema converted to po 6/30, IV Flagyl.  Previous partial colon resection for diverticulitis ( baseline 5 loose BMs daily) and previous hemorrhoidal banding.  WBCs continue rising but no fever. BP still soft and mildly tachy.  NGT pulled 6/30, Flexiseal still in place.  * Esophagitis. EGD 6/25. No PPI PTA. IV Protonix discontinued on 6/29  *  Leukocytosis.  U/A neg 6/30, cxr 6/27 with low lung volu * S/p right THR 08/03/13.  * ARF. Improving. No BMET yet today but is ordered.  * Hyperglycemia, no previous hx DM.      PLAN   *  Needs at least 2 weeks total oral vancomycin. Consider today day 2.   Will d/w Dr Henrene Pastor how long to continue the IV flagyl *  Agree with pulling flexiseal rectal tube and advancing to low residue diet.  *  May want to add incentive spirometer.     Azucena Freed  08/11/2013, 9:12 AM Pager: [phone]  GI ATTENDING  Interval history and data reviewed. Patient seen and examined. Agree with H&P as above.  All parameters improving. RECOMMENDATIONS: 1. Oral vancomycin for 2 weeks 2. Discontinue flexiseal 3. Advance diet as tolerated 4. Continue with physical therapy and increased activity 5. Discontinue IV Flagyl tomorrow GI available if needed. Will sign off  Lea Walbert N. Geri [place]in[place]., M.D. [facility] Division of Gastroenterology

## 2013-08-11 NOTE — Progress Notes (Signed)
Physical Therapy Treatment Patient Details Name: Gary [name] MRN: [id number] DOB: 07/15/1932 Today's Date: 08/11/2013    History of Present Illness 80 yo male s/p Rt THA ( direct anterior). Pt developed post-op ileus  and underwent EDG 08/05/13. PMHx- arthritis, anxiety, CAD (s/p stents), back surgery x 3, colectomy    PT Comments    Pt improved from the weekend when this therapist saw him. Pt remains limited by orthostasis but tolerating sitting well today unable to stand or ambulate. Pt encouraged to continue HEP. Wife present throughout and pt alert but confused, did receive morphine yesterday which wife reports will cause confusion. Will continue to follow.   Follow Up Recommendations  SNF;Supervision/Assistance - 24 hour     Equipment Recommendations       Recommendations for Other Services       Precautions / Restrictions Precautions Precautions: Fall Precaution Comments: flexiseal, direct anterior no precautions Restrictions RLE Weight Bearing: Weight bearing as tolerated    Mobility  Bed Mobility               General bed mobility comments: pt in recliner on arrival  Transfers Overall transfer level: Needs assistance     Sit to Stand: Min assist;+2 safety/equipment         General transfer comment: pt with cues for hand placement and sequence with assist for anterior translation, only able to stand grossly 30sec due to hypotension  Ambulation/Gait Ambulation/Gait assistance:  (unable to attempt due to hypotension)               Stairs            Wheelchair Mobility    Modified Rankin (Stroke Patients Only)       Balance                                    Cognition Arousal/Alertness: Awake/alert Behavior During Therapy: WFL for tasks assessed/performed Overall Cognitive Status: Impaired/Different from baseline   Orientation Level: Disoriented to;Time Current Attention Level: Sustained Memory: Decreased  short-term memory Following Commands: Follows one step commands consistently     Problem Solving: Slow processing;Requires tactile cues;Decreased initiation      Exercises Total Joint Exercises Ankle Circles/Pumps: AROM;Seated;Both;15 reps Hip ABduction/ADduction: AROM;AAROM;Seated;15 reps;Both (AAROM RLE) Long Arc Quad: AROM;Seated;Both;15 reps Knee Flexion: AROM;Both;15 reps;Seated;AAROM (AAROM RLE)    General Comments        Pertinent Vitals/Pain No pain sats 99% RA, HR 105-130 BP 102/58 seated 61/41 in standing 118/57 with return to sitting    Home Living                      Prior Function            PT Goals (current goals can now be found in the care plan section) Progress towards PT goals: Progressing toward goals (very slowly)    Frequency  Min 5X/week    PT Plan Current plan remains appropriate    Co-evaluation             End of Session Equipment Utilized During Treatment: Gait belt Activity Tolerance: Treatment limited secondary to medical complications (Comment) Patient left: in chair;with call bell/phone within reach;with family/visitor present     Time: 1120-1146 PT Time Calculation (min): 26 min  Charges:  $Therapeutic Exercise: 8-22 mins $Therapeutic Activity: 8-22 mins  G CodesLanetta Inch Beth 08-19-2013, 12:07 PM Elwyn Reach, [place]

## 2013-08-11 NOTE — Progress Notes (Signed)
[facility] Team 1 Consultant Note  Gary [name] [id number] DOB: [birth year] DOA: 08/03/2013 PCP: [name], MD  Brief narrative: Gary [name] is a 80 y.o. male who had undergone a right total hip arthoplasty began to have coffee ground emesis.  GI was called by the orthopedic team and medicine was consulted.  EGD showed esophagitis and ileus vs.gastroparesis. CT abdomen showed ileus.   Later developed severe diarrhea which was positive for C Diff. Became very dehydrated and developed non-oliguric ARF.  Subjective: Endorsing peri rectal pain- no abd pain or hip pain  Assessment/Plan: Principal Problem: Sepsis with hypotension -sepsis physiology resolved -WBCs climbing-plan dc foley and follow -etiology C Diff -cont supportive care  **OK FROM MEDICAL STANDPOINT TO TRANSFER TO FLOOR**  Severe C diff colitis with ileus - started having loose stools 6/27 and noted to be C diff positive  -rectal tube with watery output- baseline has frequent mushy stool due to prior sub total colectomy - Per Dr Kelby Fam note from 05/12/13, this would be his 3rd episode but has had periods of quiescence between episodes (ie resolution) so no indication for Difcid at this time - XR 6/29 demonstrated marked improvement so NGT was clamped on 6/29 and he tolerated x 24 hrs so was switched Vanco to PO and dc PR Vanco 6/30 - Cont IV Flagyl -per GI: dc rectal tube and advance to low residue/high fiber diet   ARF on CKD - responding to aggressive IVFs-today's BMET still pending - marked azotemia slowly resolving - etiology likely 2/2 severe diarrhea - suspect further worsened by recent hypotension - cont to follow closely- resolved tachycardia  - Holding bisoprolol and flomax until blood pressure stabilizes.    Acute Urinary retention - dc foley today (7/1)- RN made aware foley had been placed due to urinary retention and to monitor closely for recurrence of retention sx's   Degenerative joint disease (DJD) of hip/S/P total hip arthroplasty -per primary team -Due to increasing WBCs I removed the dressing and noticed an open area c/w superficial skin necrosis draining clear -no purulence noted  Acute encephalopathy - resolved and likely due to improperly metabolized narcs and BZDs in setting of ARF as well as significant azotemia    Upper GI bleeding/ Acute esophagitis - EGD showing esophagitis  - Hold PPI. Until patient's C. difficile resolved  Complex renal cyst, left  - MRI with and without contrast when more stable    Antibiotics: Anti-infectives   Start     Dose/Rate Route Frequency Ordered Stop   08/10/13 1200  vancomycin (VANCOCIN) 50 mg/mL oral solution 125 mg     125 mg Oral 4 times per day 08/10/13 0919     08/08/13 1500  vancomycin (VANCOCIN) 50 mg/mL oral solution 250 mg  Status:  Discontinued     250 mg Per Tube 4 times per day 08/08/13 1350 08/08/13 1919   08/08/13 1200  vancomycin (VANCOCIN) 500 mg in sodium chloride irrigation 0.9 % 100 mL ENEMA  Status:  Discontinued     500 mg Rectal 4 times per day 08/08/13 0858 08/10/13 0919   08/07/13 1815  vancomycin (VANCOCIN) 50 mg/mL oral solution 500 mg  Status:  Discontinued     500 mg Per Tube 4 times per day 08/07/13 1809 08/08/13 0858   08/07/13 1815  metroNIDAZOLE (FLAGYL) IVPB 500 mg     500 mg 100 mL/hr over 60 Minutes Intravenous Every 8 hours 08/07/13 1809 08/21/13 1814   08/03/13 1900  ceFAZolin (ANCEF) IVPB 2 g/50 mL premix     2 g 100 mL/hr over 30 Minutes Intravenous  Once 08/03/13 1556 08/03/13 1826   08/03/13 1700  ceFAZolin (ANCEF) IVPB 2 g/50 mL premix  Status:  Discontinued     2 g 100 mL/hr over 30 Minutes Intravenous Every 6 hours 08/03/13 1525 08/03/13 1556   08/03/13 1530  vancomycin (VANCOCIN) IVPB 1000 mg/200 mL premix  Status:  Discontinued     1,000 mg 200 mL/hr over 60 Minutes Intravenous Every 12 hours 08/03/13 1525 08/03/13 1555   08/03/13 1115  vancomycin  (VANCOCIN) IVPB 1000 mg/200 mL premix     1,000 mg 200 mL/hr over 60 Minutes Intravenous To Surgery 08/03/13 1109 08/03/13 1121   08/03/13 0600  ceFAZolin (ANCEF) IVPB 2 g/50 mL premix     2 g 100 mL/hr over 30 Minutes Intravenous On call to O.R. 08/02/13 1243 08/03/13 1053     Objective: Filed Weights   08/08/13 0438 08/09/13 0300 08/11/13 0400  Weight: 199 lb 11.8 oz (90.6 kg) 198 lb 6.6 oz (90 kg) 196 lb 13.9 oz (89.3 kg)    Vitals Filed Vitals:   08/10/13 2019 08/11/13 0004 08/11/13 0400 08/11/13 0738  BP: 111/55 124/59 109/55 106/55  Pulse: 102 96 90 97  Temp: 98.1 F (36.7 C) 98 F (36.7 C) 98.2 F (36.8 C) 98.3 F (36.8 C)  TempSrc: Oral Oral Oral Oral  Resp: 25 24 18 26   Height:      Weight:   196 lb 13.9 oz (89.3 kg)   SpO2: 97% 95% 93% 93%     Intake/Output Summary (Last 24 hours) at 08/11/13 0929 Last data filed at 08/11/13 0414  Gross per 24 hour  Intake   2810 ml  Output   2550 ml  Net    260 ml     Exam: General: awake and oriented, no distress Lungs: Clear to auscultation bilaterally without wheezes or crackles Cardiovascular: Regular rate and rhythm without murmur gallop or rub normal S1 and S2 Abdomen: Nontender, nondistended, soft, bowel sounds hypoactive, no rebound, no ascites, no appreciable mass- rectal tube with less liquid and more mushy type stool Extremities: No significant cyanosis, clubbing, or edema  Data Reviewed: Basic Metabolic Panel:  Recent Labs Lab 08/07/13 1406 08/08/13 0940 08/08/13 1819 08/09/13 0255 08/10/13 0426  NA 137 140 139 140 145  K 3.8 3.5* 3.4* 3.7 3.7  CL 96 99 99 101 103  CO2 17* 17* 18* 19 26  GLUCOSE 164* 176* 147* 178* 178*  BUN 80* 111* 123* 129* 121*  CREATININE 3.47* 4.91* 5.40* 5.39* 4.44*  CALCIUM 8.1* 7.8* 7.6* 7.8* 7.9*   Liver Function Tests:  Recent Labs Lab 08/05/13 1245 08/07/13 1406  AST 48* 32  ALT 12 16  ALKPHOS 34* 28*  BILITOT 1.2 0.8  PROT 5.5* 5.8*  ALBUMIN 2.8* 2.2*     Recent Labs Lab 08/05/13 1245  LIPASE 9*   No results found for this basename: AMMONIA,  in the last 168 hours CBC:  Recent Labs Lab 08/06/13 0308 08/07/13 1406 08/09/13 0255 08/10/13 0426 08/11/13 0457  WBC 2.7* 3.2* 7.3 14.0* 19.5*  NEUTROABS  --  2.3  --   --   --   HGB 9.4* 9.6* 9.4* 10.4* 10.2*  HCT 28.4* 28.7* 27.2* 31.0* 30.0*  MCV 89.9 90.0 85.8 87.8 87.5  PLT 136* 179 216 241 283   Cardiac Enzymes: No results found for this basename: CKTOTAL, CKMB, CKMBINDEX, TROPONINI,  in the last 168 hours BNP (last 3 results) No results found for this basename: PROBNP,  in the last 8760 hours CBG: No results found for this basename: GLUCAP,  in the last 168 hours  Recent Results (from the past 240 hour(s))  CLOSTRIDIUM DIFFICILE BY PCR     Status: Abnormal   Collection Time    08/07/13  2:45 PM      Result Value Ref Range Status   C difficile by pcr POSITIVE (*) NEGATIVE Final   Comment: CRITICAL RESULT CALLED TO, READ BACK BY AND VERIFIED WITH:     Novella Rob RN AT 1804 08/07/13 BY WOOLLENK     Studies:  Recent x-ray studies have been reviewed in detail by the Attending Physician  Scheduled Meds:  Scheduled Meds: . antiseptic oral rinse  15 mL Mouth Rinse BID  . chlorhexidine  15 mL Mouth Rinse BID  . famotidine (PEPCID) IV  20 mg Intravenous Daily  . feeding supplement (RESOURCE BREEZE)  1 Container Oral TID BM  . fluticasone  2 spray Each Nare Daily  . metronidazole  500 mg Intravenous Q8H  . vancomycin  125 mg Oral 4 times per day   Continuous Infusions:    Critical care time: 45 min   ELLIS,ALLISON L., ANP 08/11/2013, 9:29 AM  LOS: 8 days   [email or web address]  Examined patient with ANP Ebony Hail discussed assessment and plan and agree with plan. Patient was given opportunity to ask questions; all questions were answered appropriately. Patient with multiple complex medical problems greater than 40 minutes used in direct patient care

## 2013-08-11 NOTE — Progress Notes (Signed)
Patient has been sitting on the recliner for almost 3 hours and tolerated it well.  Transferred back to bed using a walker with 2 assists.

## 2013-08-11 NOTE — Progress Notes (Signed)
Occupational Therapy Treatment Patient Details Name: Gary [name] MRN: [id number] DOB: Sep 22, 1932 Today's Date: 08/11/2013    History of present illness 80 yo male s/p Rt THA ( direct anterior). Pt developed post-op ileus  and underwent EDG 08/05/13. PMHx- arthritis, anxiety, CAD (s/p stents), back surgery x 3, colectomy   OT comments  Pt remains orthostatic during session. Pt becoming more symptomatic with mobility. Pt positioned in recliner and session ended due to incr dizziness. RN aware. Ot to continue to follow acutely   Follow Up Recommendations  SNF    Equipment Recommendations  Other (comment)    Recommendations for Other Services      Precautions / Restrictions Precautions Precautions: Fall Precaution Comments: flexiseal, direct anterior no precautions,ted hose Restrictions RLE Weight Bearing: Weight bearing as tolerated       Mobility Bed Mobility               General bed mobility comments: EOB on arrival  Transfers Overall transfer level: Needs assistance   Transfers: Sit to/from Stand Sit to Stand: +2 physical assistance;Min assist         General transfer comment: pt reporting incr dizziness    Balance Overall balance assessment: Needs assistance Sitting-balance support: Bilateral upper extremity supported;Feet supported Sitting balance-Leahy Scale: Poor Sitting balance - Comments: posterior lean and reports I need to lay down                           ADL Overall ADL's : Needs assistance/impaired Eating/Feeding: Set up;Sitting   Grooming: Wash/dry face;Min guard;Sitting       Lower Body Bathing: Maximal assistance;Sit to/from stand           Toilet Transfer: +2 for physical assistance;Minimal assistance Toilet Transfer Details (indicate cue type and reason): PT orthostatic           General ADL Comments: Pt sitting eob on arrival with RN staff. Pt with trending down BP. pt orthostatic with transfer. Pt with ted  hose don and beginning to become symptomatic. pt reclined and SCD resumed      Vision                     Perception     Praxis      Cognition   Behavior During Therapy: [facility] for tasks assessed/performed Overall Cognitive Status: Impaired/Different from baseline Area of Impairment: Awareness;Safety/judgement;Memory Orientation Level: Disoriented to;Time Current Attention Level: Sustained Memory: Decreased short-term memory  Following Commands: Follows one step commands with increased time Safety/Judgement: Decreased awareness of deficits   Problem Solving: Slow processing General Comments: Pt very slow processing to all questions    Extremity/Trunk Assessment               Exercises Total Joint Exercises Ankle Circles/Pumps: AROM;Seated;Both;15 reps Hip ABduction/ADduction: AROM;AAROM;Seated;15 reps;Both (AAROM RLE) Long Arc Quad: AROM;Seated;Both;15 reps Knee Flexion: AROM;Both;15 reps;Seated;AAROM (AAROM RLE)   Shoulder Instructions       General Comments      Pertinent Vitals/ Pain       Orthostatic BP  Home Living                                          Prior Functioning/Environment              Frequency Min 2X/week     Progress Toward  Goals  OT Goals(current goals can now be found in the care plan section)  Progress towards OT goals: Progressing toward goals  Acute Rehab OT Goals Patient Stated Goal: none stated this session OT Goal Formulation: With patient Time For Goal Achievement: 08/23/13 Potential to Achieve Goals: Good ADL Goals Pt Will Perform Grooming: with supervision;standing Pt Will Perform Lower Body Bathing: with supervision;with adaptive equipment;sit to/from stand Pt Will Perform Lower Body Dressing: with supervision;sit to/from stand;with adaptive equipment Pt Will Transfer to Toilet: with supervision;ambulating;bedside commode Pt Will Perform Toileting - Clothing Manipulation and hygiene:  with supervision;sit to/from stand Pt Will Perform Tub/Shower Transfer: Tub transfer;with min guard assist;ambulating;tub bench;rolling walker Additional ADL Goal #1: Pt will perform bed mobility with min assist with HOB flat and no rail as a precursor to ADL.  Plan Discharge plan remains appropriate    Co-evaluation                 End of Session     Activity Tolerance Patient tolerated treatment well   Patient Left in chair;with call bell/phone within reach;with family/visitor present   Nurse Communication Mobility status;Precautions        Time: 1014-1030 OT Time Calculation (min): 16 min  Charges: OT General Charges $OT Visit: 1 Procedure OT Treatments $Therapeutic Activity: 8-22 mins  Parke Poisson B 08/11/2013, 2:41 PM Pager: [phone]

## 2013-08-11 NOTE — Progress Notes (Signed)
Patient still has loose watery stools.  Foley cath discontinued as ordered.  Wife at bedside.

## 2013-08-12 ENCOUNTER — Inpatient Hospital Stay (HOSPITAL_COMMUNITY): Payer: Medicare Other

## 2013-08-12 DIAGNOSIS — I2789 Other specified pulmonary heart diseases: Secondary | ICD-10-CM

## 2013-08-12 DIAGNOSIS — D72829 Elevated white blood cell count, unspecified: Secondary | ICD-10-CM

## 2013-08-12 DIAGNOSIS — E876 Hypokalemia: Secondary | ICD-10-CM | POA: Diagnosis present

## 2013-08-12 LAB — BASIC METABOLIC PANEL
Anion gap: 16 — ABNORMAL HIGH (ref 5–15)
BUN: 94 mg/dL — AB (ref 6–23)
CHLORIDE: 98 meq/L (ref 96–112)
CO2: 22 mEq/L (ref 19–32)
Calcium: 7.9 mg/dL — ABNORMAL LOW (ref 8.4–10.5)
Creatinine, Ser: 2.96 mg/dL — ABNORMAL HIGH (ref 0.50–1.35)
GFR calc Af Amer: 22 mL/min — ABNORMAL LOW (ref >90)
GFR calc non Af Amer: 19 mL/min — ABNORMAL LOW (ref >90)
Glucose, Bld: 169 mg/dL — ABNORMAL HIGH (ref 70–99)
POTASSIUM: 3 meq/L — AB (ref 3.7–5.3)
Sodium: 136 mEq/L — ABNORMAL LOW (ref 137–147)

## 2013-08-12 LAB — CBC
HCT: 31.4 % — ABNORMAL LOW (ref 39.0–52.0)
HEMOGLOBIN: 10.6 g/dL — AB (ref 13.0–17.0)
MCH: 29.9 pg (ref 26.0–34.0)
MCHC: 33.8 g/dL (ref 30.0–36.0)
MCV: 88.5 fL (ref 78.0–100.0)
Platelets: 323 10*3/uL (ref 150–400)
RBC: 3.55 MIL/uL — AB (ref 4.22–5.81)
RDW: 14.9 % (ref 11.5–15.5)
WBC: 21.1 10*3/uL — ABNORMAL HIGH (ref 4.0–10.5)

## 2013-08-12 LAB — URINE CULTURE
COLONY COUNT: NO GROWTH
CULTURE: NO GROWTH

## 2013-08-12 MED ORDER — POTASSIUM CHLORIDE 10 MEQ/100ML IV SOLN
10.0000 meq | INTRAVENOUS | Status: AC
  Administered 2013-08-12 – 2013-08-13 (×4): 10 meq via INTRAVENOUS
  Filled 2013-08-12 (×4): qty 100

## 2013-08-12 MED ORDER — FAMOTIDINE 20 MG PO TABS
20.0000 mg | ORAL_TABLET | Freq: Every day | ORAL | Status: DC
  Administered 2013-08-12 – 2013-08-18 (×7): 20 mg via ORAL
  Filled 2013-08-12 (×7): qty 1

## 2013-08-12 MED ORDER — METRONIDAZOLE 500 MG PO TABS
500.0000 mg | ORAL_TABLET | Freq: Three times a day (TID) | ORAL | Status: DC
  Administered 2013-08-12 – 2013-08-13 (×4): 500 mg via ORAL
  Filled 2013-08-12 (×6): qty 1

## 2013-08-12 MED ORDER — SODIUM CHLORIDE 0.9 % IV SOLN
INTRAVENOUS | Status: DC
  Administered 2013-08-12 – 2013-08-14 (×4): via INTRAVENOUS

## 2013-08-12 NOTE — Progress Notes (Signed)
Physical Therapy Treatment Patient Details Name: Gary [name] MRN: [id number] DOB: 1932-10-20 Today's Date: 08/12/2013    History of Present Illness 80 yo male s/p Rt THA ( direct anterior). Pt developed post-op ileus  and underwent EDG 08/05/13. PMHx- arthritis, anxiety, CAD (s/p stents), back surgery x 3, colectomy    PT Comments    Pt progressing towards physical therapy goals. Orthostatic measurements unable to be taken during standing due to rectal pouch leaking and need to return pt to supine. Upon return to supine pt states his dizziness has improved. Pt able to tolerate standing ~15 seconds. Showing slow progress with ability to initiate LE movement on the right during transfers. Will continue to follow.   Follow Up Recommendations  SNF;Supervision/Assistance - 24 hour     Equipment Recommendations  None recommended by PT    Recommendations for Other Services OT consult     Precautions / Restrictions Precautions Precautions: Fall Precaution Comments: flexiseal, direct anterior no precautions,ted hose Restrictions Weight Bearing Restrictions: Yes RLE Weight Bearing: Weight bearing as tolerated    Mobility  Bed Mobility Overal bed mobility: +2 for physical assistance;Needs Assistance Bed Mobility: Supine to Sit;Sit to Supine     Supine to sit: +2 for physical assistance;Mod assist Sit to supine: Mod assist;+2 for physical assistance   General bed mobility comments: Assist for LE movement as well as for trunk elevation to full sitting position. Was careful to move bed pad with pt as rectal pouch was leaking.   Transfers Overall transfer level: Needs assistance Equipment used: 2 person hand held assist Transfers: Sit to/from Stand Sit to Stand: +2 physical assistance;Min assist         General transfer comment: Pt reporting increased dizziness and after ~15 seconds asking to sit back down. During this time rectal pouch began to leak more and pt was returned to  supine to be cleaned up.   Ambulation/Gait                 Stairs            Wheelchair Mobility    Modified Rankin (Stroke Patients Only)       Balance Overall balance assessment: Needs assistance Sitting-balance support: Feet supported;Bilateral upper extremity supported Sitting balance-Leahy Scale: Poor     Standing balance support: Bilateral upper extremity supported Standing balance-Leahy Scale: Poor                      Cognition Arousal/Alertness: Awake/alert Behavior During Therapy: WFL for tasks assessed/performed Overall Cognitive Status: Within Functional Limits for tasks assessed                      Exercises General Exercises - Lower Extremity Ankle Circles/Pumps: 10 reps    General Comments        Pertinent Vitals/Pain Orthostatics unable to be taken in standing. Other vitals stable throughout session on RA.     Home Living                      Prior Function            PT Goals (current goals can now be found in the care plan section) Acute Rehab PT Goals Patient Stated Goal: none stated this session PT Goal Formulation: With patient Time For Goal Achievement: 08/18/13 Potential to Achieve Goals: Good Progress towards PT goals: Progressing toward goals    Frequency  Min 5X/week  PT Plan Current plan remains appropriate    Co-evaluation             End of Session Equipment Utilized During Treatment: Gait belt Activity Tolerance: Treatment limited secondary to medical complications (Comment) (Dizziness upon standing) Patient left: in bed;with call bell/phone within reach;with family/visitor present     Time: 1458-1530 PT Time Calculation (min): 32 min  Charges:  $Therapeutic Activity: 23-37 mins                    G Codes:      Jolyn Lent Aug 13, 2013, 4:30 PM  Jolyn Lent, PT, DPT Acute Rehabilitation Services Pager: [phone]

## 2013-08-12 NOTE — Progress Notes (Signed)
Assisted patient back to with 2 person assisted using a walker.  DAughter, Artis Delay, is made aware of patient's transfer to [facility] 20.  Patient will be transferred \\per  hospital bed.  Report given to Earl, Therapist, sports.

## 2013-08-12 NOTE — Progress Notes (Signed)
Subjective: 7 Days Post-Op Procedure(s) (LRB): ESOPHAGOGASTRODUODENOSCOPY (EGD) (N/A) S/P Right anterior THR  Activity level:  wbat in reguards to hip Diet tolerance:  Progressing from clears  Voiding:  Rectal tube removed and foley still in place  Patient reports pain as mild.    Objective: Vital signs in last 24 hours: Temp:  [97.6 F (36.4 C)-98.5 F (36.9 C)] 97.8 F (36.6 C) (07/02 1123) Pulse Rate:  [83-102] 93 (07/02 1123) Resp:  [19-26] 20 (07/02 1123) BP: (114-140)/(57-72) 124/57 mmHg (07/02 1123) SpO2:  [94 %-100 %] 97 % (07/02 1123)  Labs:  Recent Labs  08/10/13 0426 08/11/13 0457 08/12/13 0305  HGB 10.4* 10.2* 10.6*    Recent Labs  08/11/13 0457 08/12/13 0305  WBC 19.5* 21.1*  RBC 3.43* 3.55*  HCT 30.0* 31.4*  PLT 283 323    Recent Labs  08/11/13 0955 08/12/13 0300  NA 138 136*  K 3.1* 3.0*  CL 98 98  CO2 23 22  BUN 100* 94*  CREATININE 3.17* 2.96*  GLUCOSE 186* 169*  CALCIUM 7.5* 7.9*   No results found for this basename: LABPT, INR,  in the last 72 hours  Physical Exam:  Neurologically intact Neurovascular intact Sensation intact distally Intact pulses distally Dorsiflexion/Plantar flexion intact Incision: dressing C/D/I No cellulitis present Compartment soft  Assessment/Plan:  7 Days Post-Op Procedure(s) (LRB): ESOPHAGOGASTRODUODENOSCOPY (EGD) (N/A) Up with therapy as tolerated Discharge to SNF per medical team when patient is stable.  Currently being treated for C-diff.  SCD's for DVT prophylaxis with no anticoagulation due to GI issues  Continue current pain regimen.  Medicine managing sepsis, C-Diff with abx, Post-op ilius imporoving. ARF, pre-renal, currently receiving fluids. Hypotension, currently receiving fluids.  Appreciate medical input.     Dajha Urquilla, Larwance Sachs 08/12/2013, 12:48 PM

## 2013-08-12 NOTE — Progress Notes (Signed)
PHARMACIST - PHYSICIAN COMMUNICATION DR:   Rhona Raider CONCERNING: Antibiotic IV to Oral Route Change Policy  RECOMMENDATION: This patient is receiving metronidazole by the intravenous route.  Based on criteria approved by the Pharmacy and Therapeutics Committee, the antibiotic(s) is/are being converted to the equivalent oral dose form(s).   DESCRIPTION: These criteria include:  Patient being treated for a respiratory tract infection, urinary tract infection, cellulitis or clostridium difficile associated diarrhea if on metronidazole  The patient is not neutropenic and does not exhibit a GI malabsorption state  The patient is eating (either orally or via tube) and/or has been taking other orally administered medications for a least 24 hours  The patient is improving clinically and has a Tmax < 100.5  If you have questions about this conversion, please contact the Pharmacy Department  []   [phone] )  [facility] Na [x]   [phone] )  [facility] Pontes  []   [phone] )  [facility] []   [phone] )  [place], PharmD, BCPS Clinical Pharmacist Pager [phone]

## 2013-08-12 NOTE — Progress Notes (Signed)
[facility] Team 1 Consultant Note  Gary [name] [id number] DOB: 09-06-1932 DOA: 08/03/2013 PCP: [name], MD  Brief narrative: Gary [name] is a 80 y.o. WM  who had undergone a right total hip arthoplasty began to have coffee ground emesis.  GI was called by the orthopedic team and medicine was consulted.  EGD showed esophagitis and ileus vs.gastroparesis. CT abdomen showed ileus.   Later developed severe diarrhea which was positive for C Diff. Became very dehydrated and developed non-oliguric ARF.  Subjective: Up in chair- poor appetite but no abd pain or nausea with eating- no pain in right hip  Assessment/Plan: Principal Problem: Sepsis with hypotension -sepsis physiology resolved -WBCs climbing- dc'd foley 7/1 -etiology C Diff -cont supportive care  **OK FROM MEDICAL STANDPOINT TO TRANSFER TO FLOOR**  Severe C diff colitis with ileus - started having loose stools 6/27 and noted to be C diff positive  -rectal tube with watery output- baseline has frequent mushy stool due to prior sub total colectomy - Per Dr Kelby Fam note from 05/12/13, this would be his 3rd episode but has had periods of quiescence between episodes (ie resolution) so no indication for Difcid at this time - XR 6/29 demonstrated marked improvement so NGT was clamped on 6/29 and he tolerated x 24 hrs so was switched Vanco to PO and dc PR Vanco 6/30 - Change to PO Flagyl (7/2) -per GI: dc rectal tube and advance to low residue/high fiber diet-too much output so rectal tube not dc'd -Patient continues to have diarrhea although beginning to form. This is resulting in situational hypotension -Daily weight -Strict in and out   Persistent Leukocytosis -as above: FU CXR 7/2 without PNA -UA 6/30 negative -right hip surgical wound unremarkable and CT femur without evidence of underlying infection but does show possible bone avulsion at prosthesis-defer to ortho -LE's without edema so doubt  DVT -likely leukemoid rxn  Hypotension -Secondary to continued diarrhea from C. difficile colitis, normal saline 174ml/hr -Continue to hold all BP medications  Pulmonary hypertension -Currently compensated.   ARF on CKD - responding to aggressive IVFs-marked improvement in labs - etiology likely 2/2 severe diarrhea and suspect further worsened by recent hypotension - cont to follow closely- resolved tachycardia  - Holding bisoprolol and flomax until blood pressure stabilizes.    Acute Urinary retention - dc'd foley (7/1)- RN made aware foley had been placed due to urinary retention and to monitor closely for recurrence of retention sx's -7/2: voiding with condom cath in place   Degenerative joint disease (DJD) of hip/S/P total hip arthroplasty -per primary team -Due to increasing WBCs I removed the dressing and noticed an open area c/w superficial skin necrosis draining clear -no purulence noted -7/2: WBC climbing so will ck CT right hip/femur for completeness  Acute encephalopathy - resolved and likely due to improperly metabolized narcs and BZDs in setting of ARF as well as significant azotemia    Upper GI bleeding/ Acute esophagitis - EGD showing esophagitis  - Hold PPI. Until patient's C. difficile resolved  Complex renal cyst, left  - MRI with and without contrast when more stable  Hypokalemia -Replete 10 mEq x4 runs -Check magnesium, and phosphorus secondary to continued diarrhea    Antibiotics: Anti-infectives   Start     Dose/Rate Route Frequency Ordered Stop   08/10/13 1200  vancomycin (VANCOCIN) 50 mg/mL oral solution 125 mg     125 mg Oral 4 times per day 08/10/13 0919  08/08/13 1500  vancomycin (VANCOCIN) 50 mg/mL oral solution 250 mg  Status:  Discontinued     250 mg Per Tube 4 times per day 08/08/13 1350 08/08/13 1919   08/08/13 1200  vancomycin (VANCOCIN) 500 mg in sodium chloride irrigation 0.9 % 100 mL ENEMA  Status:  Discontinued     500 mg Rectal  4 times per day 08/08/13 0858 08/10/13 0919   08/07/13 1815  vancomycin (VANCOCIN) 50 mg/mL oral solution 500 mg  Status:  Discontinued     500 mg Per Tube 4 times per day 08/07/13 1809 08/08/13 0858   08/07/13 1815  metroNIDAZOLE (FLAGYL) IVPB 500 mg     500 mg 100 mL/hr over 60 Minutes Intravenous Every 8 hours 08/07/13 1809 08/21/13 1814   08/03/13 1900  ceFAZolin (ANCEF) IVPB 2 g/50 mL premix     2 g 100 mL/hr over 30 Minutes Intravenous  Once 08/03/13 1556 08/03/13 1826   08/03/13 1700  ceFAZolin (ANCEF) IVPB 2 g/50 mL premix  Status:  Discontinued     2 g 100 mL/hr over 30 Minutes Intravenous Every 6 hours 08/03/13 1525 08/03/13 1556   08/03/13 1530  vancomycin (VANCOCIN) IVPB 1000 mg/200 mL premix  Status:  Discontinued     1,000 mg 200 mL/hr over 60 Minutes Intravenous Every 12 hours 08/03/13 1525 08/03/13 1555   08/03/13 1115  vancomycin (VANCOCIN) IVPB 1000 mg/200 mL premix     1,000 mg 200 mL/hr over 60 Minutes Intravenous To Surgery 08/03/13 1109 08/03/13 1121   08/03/13 0600  ceFAZolin (ANCEF) IVPB 2 g/50 mL premix     2 g 100 mL/hr over 30 Minutes Intravenous On call to O.R. 08/02/13 1243 08/03/13 1053     Objective: Filed Weights   08/08/13 0438 08/09/13 0300 08/11/13 0400  Weight: 199 lb 11.8 oz (90.6 kg) 198 lb 6.6 oz (90 kg) 196 lb 13.9 oz (89.3 kg)    Vitals Filed Vitals:   08/11/13 2015 08/12/13 0004 08/12/13 0418 08/12/13 0731  BP: 114/72 134/68 140/58 123/66  Pulse: 95 93 102 91  Temp: 98.5 F (36.9 C) 98.5 F (36.9 C) 98.2 F (36.8 C) 97.6 F (36.4 C)  TempSrc: Oral Oral Oral Oral  Resp: 22 26 23 19   Height:      Weight:      SpO2: 100% 97% 98% 97%     Intake/Output Summary (Last 24 hours) at 08/12/13 1024 Last data filed at 08/12/13 0600  Gross per 24 hour  Intake    460 ml  Output   1900 ml  Net  -1440 ml     Exam: General: A./O. x4, complaining of some nausea with sitting or standing, Lungs: Clear to auscultation bilaterally without  wheezes or crackles Cardiovascular: Regular rate and rhythm without murmur gallop or rub normal S1 and S2 Abdomen: Nontender, nondistended, soft, bowel sounds hypoactive, no rebound, no ascites, no appreciable mass- rectal tube with less liquid and more mushy type stool Extremities: No significant cyanosis, clubbing, or edema  Data Reviewed: Basic Metabolic Panel:  Recent Labs Lab 08/08/13 1819 08/09/13 0255 08/10/13 0426 08/11/13 0955 08/12/13 0300  NA 139 140 145 138 136*  K 3.4* 3.7 3.7 3.1* 3.0*  CL 99 101 103 98 98  CO2 18* 19 26 23 22   GLUCOSE 147* 178* 178* 186* 169*  BUN 123* 129* 121* 100* 94*  CREATININE 5.40* 5.39* 4.44* 3.17* 2.96*  CALCIUM 7.6* 7.8* 7.9* 7.5* 7.9*   Liver Function Tests:  Recent Labs  Lab 08/05/13 1245 08/07/13 1406  AST 48* 32  ALT 12 16  ALKPHOS 34* 28*  BILITOT 1.2 0.8  PROT 5.5* 5.8*  ALBUMIN 2.8* 2.2*    Recent Labs Lab 08/05/13 1245  LIPASE 9*   No results found for this basename: AMMONIA,  in the last 168 hours CBC:  Recent Labs Lab 08/07/13 1406 08/09/13 0255 08/10/13 0426 08/11/13 0457 08/11/13 0955 08/12/13 0305  WBC 3.2* 7.3 14.0* 19.5*  --  21.1*  NEUTROABS 2.3  --   --   --  19.4*  --   HGB 9.6* 9.4* 10.4* 10.2*  --  10.6*  HCT 28.7* 27.2* 31.0* 30.0*  --  31.4*  MCV 90.0 85.8 87.8 87.5  --  88.5  PLT 179 216 241 283  --  323   Cardiac Enzymes: No results found for this basename: CKTOTAL, CKMB, CKMBINDEX, TROPONINI,  in the last 168 hours BNP (last 3 results) No results found for this basename: PROBNP,  in the last 8760 hours CBG: No results found for this basename: GLUCAP,  in the last 168 hours  Recent Results (from the past 240 hour(s))  CLOSTRIDIUM DIFFICILE BY PCR     Status: Abnormal   Collection Time    08/07/13  2:45 PM      Result Value Ref Range Status   C difficile by pcr POSITIVE (*) NEGATIVE Final   Comment: CRITICAL RESULT CALLED TO, READ BACK BY AND VERIFIED WITH:     Novella Rob RN AT  [id number] 08/07/13 BY [place]     Studies: 03/29/2011 echocardiogram; mild LVH. LVEF= 55% to 60%. - Left atrium: mildly dilated. - Pulmonary arteries:  PA peak pressure: 80mm Hg (S).    Scheduled Meds:  Scheduled Meds: . antiseptic oral rinse  15 mL Mouth Rinse BID  . famotidine  20 mg Oral Daily  . feeding supplement (RESOURCE BREEZE)  1 Container Oral TID BM  . fluticasone  2 spray Each Nare Daily  . metronidazole  500 mg Intravenous Q8H  . vancomycin  125 mg Oral 4 times per day   Continuous Infusions:    Critical care time: 45 min   ELLIS,ALLISON L., ANP 08/12/2013, 10:24 AM  LOS: 9 days   [email or web address]  Examined patient and discussed assessment and plan with ANP Ebony Hail, agree with above plan. Answered all patient's questions. Patient with multiple complex medical problems direct patient care> 40 minutes

## 2013-08-13 ENCOUNTER — Inpatient Hospital Stay (HOSPITAL_COMMUNITY): Payer: Medicare Other

## 2013-08-13 DIAGNOSIS — N189 Chronic kidney disease, unspecified: Secondary | ICD-10-CM

## 2013-08-13 DIAGNOSIS — N179 Acute kidney failure, unspecified: Secondary | ICD-10-CM | POA: Diagnosis present

## 2013-08-13 DIAGNOSIS — D631 Anemia in chronic kidney disease: Secondary | ICD-10-CM | POA: Diagnosis present

## 2013-08-13 DIAGNOSIS — N1832 Chronic kidney disease, stage 3b: Secondary | ICD-10-CM | POA: Diagnosis present

## 2013-08-13 DIAGNOSIS — N183 Chronic kidney disease, stage 3 (moderate): Secondary | ICD-10-CM

## 2013-08-13 LAB — CBC WITH DIFFERENTIAL/PLATELET
Basophils Absolute: 0.2 10*3/uL — ABNORMAL HIGH (ref 0.0–0.1)
Basophils Relative: 1 % (ref 0–1)
EOS PCT: 1 % (ref 0–5)
Eosinophils Absolute: 0.2 10*3/uL (ref 0.0–0.7)
HCT: 30.9 % — ABNORMAL LOW (ref 39.0–52.0)
Hemoglobin: 10.6 g/dL — ABNORMAL LOW (ref 13.0–17.0)
Lymphocytes Relative: 4 % — ABNORMAL LOW (ref 12–46)
Lymphs Abs: 1 10*3/uL (ref 0.7–4.0)
MCH: 29.8 pg (ref 26.0–34.0)
MCHC: 34.3 g/dL (ref 30.0–36.0)
MCV: 86.8 fL (ref 78.0–100.0)
MONO ABS: 1 10*3/uL (ref 0.1–1.0)
Monocytes Relative: 4 % (ref 3–12)
NEUTROS PCT: 90 % — AB (ref 43–77)
Neutro Abs: 21.8 10*3/uL — ABNORMAL HIGH (ref 1.7–7.7)
PLATELETS: 330 10*3/uL (ref 150–400)
RBC: 3.56 MIL/uL — AB (ref 4.22–5.81)
RDW: 14.8 % (ref 11.5–15.5)
WBC: 24.2 10*3/uL — ABNORMAL HIGH (ref 4.0–10.5)

## 2013-08-13 LAB — URINALYSIS, ROUTINE W REFLEX MICROSCOPIC
Bilirubin Urine: NEGATIVE
Glucose, UA: NEGATIVE mg/dL
KETONES UR: NEGATIVE mg/dL
LEUKOCYTES UA: NEGATIVE
NITRITE: NEGATIVE
PROTEIN: NEGATIVE mg/dL
Specific Gravity, Urine: 1.019 (ref 1.005–1.030)
UROBILINOGEN UA: 0.2 mg/dL (ref 0.0–1.0)
pH: 6.5 (ref 5.0–8.0)

## 2013-08-13 LAB — CBC
HCT: 37.7 % — ABNORMAL LOW (ref 39.0–52.0)
Hemoglobin: 12.7 g/dL — ABNORMAL LOW (ref 13.0–17.0)
MCH: 29.7 pg (ref 26.0–34.0)
MCHC: 33.7 g/dL (ref 30.0–36.0)
MCV: 88.3 fL (ref 78.0–100.0)
PLATELETS: 397 10*3/uL (ref 150–400)
RBC: 4.27 MIL/uL (ref 4.22–5.81)
RDW: 15.1 % (ref 11.5–15.5)
WBC: 23.4 10*3/uL — ABNORMAL HIGH (ref 4.0–10.5)

## 2013-08-13 LAB — COMPREHENSIVE METABOLIC PANEL
ALT: 12 U/L (ref 0–53)
ANION GAP: 16 — AB (ref 5–15)
AST: 19 U/L (ref 0–37)
Albumin: 2.3 g/dL — ABNORMAL LOW (ref 3.5–5.2)
Alkaline Phosphatase: 52 U/L (ref 39–117)
BUN: 76 mg/dL — ABNORMAL HIGH (ref 6–23)
CO2: 19 mEq/L (ref 19–32)
CREATININE: 2.31 mg/dL — AB (ref 0.50–1.35)
Calcium: 7.8 mg/dL — ABNORMAL LOW (ref 8.4–10.5)
Chloride: 100 mEq/L (ref 96–112)
GFR calc Af Amer: 29 mL/min — ABNORMAL LOW (ref >90)
GFR calc non Af Amer: 25 mL/min — ABNORMAL LOW (ref >90)
Glucose, Bld: 166 mg/dL — ABNORMAL HIGH (ref 70–99)
Potassium: 3.4 mEq/L — ABNORMAL LOW (ref 3.7–5.3)
Sodium: 135 mEq/L — ABNORMAL LOW (ref 137–147)
TOTAL PROTEIN: 5.4 g/dL — AB (ref 6.0–8.3)
Total Bilirubin: 0.6 mg/dL (ref 0.3–1.2)

## 2013-08-13 LAB — URINE MICROSCOPIC-ADD ON

## 2013-08-13 LAB — PHOSPHORUS: Phosphorus: 3.8 mg/dL (ref 2.3–4.6)

## 2013-08-13 LAB — MAGNESIUM: Magnesium: 2.4 mg/dL (ref 1.5–2.5)

## 2013-08-13 MED ORDER — VANCOMYCIN 50 MG/ML ORAL SOLUTION: 125.0000 mg | Freq: Every day | ORAL | Status: DC

## 2013-08-13 MED ORDER — VANCOMYCIN 50 MG/ML ORAL SOLUTION
125.0000 mg | ORAL | Status: DC
Start: 2013-09-01 — End: 2013-08-18

## 2013-08-13 MED ORDER — VANCOMYCIN 50 MG/ML ORAL SOLUTION: 125.0000 mg | ORAL | Status: DC

## 2013-08-13 MED ORDER — ENSURE COMPLETE PO LIQD
237.0000 mL | Freq: Three times a day (TID) | ORAL | Status: DC
  Administered 2013-08-13 – 2013-08-17 (×7): 237 mL via ORAL

## 2013-08-13 MED ORDER — VANCOMYCIN 50 MG/ML ORAL SOLUTION
125.0000 mg | Freq: Two times a day (BID) | ORAL | Status: DC
  Administered 2013-08-17 – 2013-08-18 (×2): 125 mg via ORAL
  Filled 2013-08-13 (×3): qty 2.5

## 2013-08-13 MED ORDER — PROMETHAZINE HCL 25 MG/ML IJ SOLN
12.5000 mg | Freq: Four times a day (QID) | INTRAMUSCULAR | Status: DC | PRN
  Administered 2013-08-13: 12.5 mg via INTRAVENOUS
  Filled 2013-08-13: qty 1

## 2013-08-13 MED ORDER — DEXTROSE 5 % IV SOLN
1.0000 g | INTRAVENOUS | Status: DC
  Administered 2013-08-13: 1 g via INTRAVENOUS
  Filled 2013-08-13 (×2): qty 10

## 2013-08-13 MED ORDER — VANCOMYCIN 50 MG/ML ORAL SOLUTION
125.0000 mg | Freq: Four times a day (QID) | ORAL | Status: AC
  Administered 2013-08-13 – 2013-08-17 (×15): 125 mg via ORAL
  Filled 2013-08-13 (×17): qty 2.5

## 2013-08-13 NOTE — Progress Notes (Signed)
Patient continues to have loose stools. Rectal pouch keeps leaking. Re-evaluate for rectal tube placement as the patient is getting raw in that area. Thank you, nursing

## 2013-08-13 NOTE — Progress Notes (Signed)
Pt arrived via bed accompanied by 2 NTs. Pt belongings with pt and placed in closet. Pt placed on monitor. Pt is lethargic. IV leaking. IV was Removed and new IV placed. Pt given call bell and instructed on how to use it. Family at bedside. Will continue to monitor.

## 2013-08-13 NOTE — Discharge Instructions (Signed)
May be weightbearing as tolerated Ice elevation May change dressing as needed ASA 325 twice a day x4 weeks Return to office 2 weeks Home therapy  [facility] Stay Proper nutrition can help your body recover from illness and injury.   Foods and beverages high in protein, vitamins, and minerals help rebuild muscle loss, promote healing, & reduce fall risk.   In addition to eating healthy foods, a nutrition shake is an easy, delicious way to get the nutrition you need during and after your hospital stay  It is recommended that you continue to drink 2 bottles per day of:       Ensure Plus or Ensure Complete for at least 1 month (30 days) after your hospital stay   Tips for adding a nutrition shake into your routine: As allowed, drink one with vitamins or medications instead of water or juice Enjoy one as a tasty mid-morning or afternoon snack Drink cold or make a milkshake out of it Drink one instead of milk with cereal or snacks Use as a coffee creamer   Available at the following grocery stores and pharmacies:           * [place] [phone]            For COUPONS visit: [email or web address]/join or [email or web address]   Suggested Substitutions Ensure Plus = Boost Plus = Carnation Breakfast Essentials = Boost Bompact Ensure Active Clear = Boost Breeze Glucerna Shake = Boost Glucose Control = Carnation Breakfast Essentials SUGAR FREE

## 2013-08-13 NOTE — Progress Notes (Signed)
    Patient s/p hip replacement, doing well from ortho standpoint. Continues to have various medical issues and reflected in chart   Physical Exam: Filed Vitals:   08/13/13 0707  BP: 121/65  Pulse: 91  Temp: 97.5 F (36.4 C)  Resp: 16    Incision intact with very small opening at top pf incision and dried bloody drainage NVI  S/p right THA on 6/23 with various ongoing medical issues, managed by the medical team  - C-diff, ilieus, ARF management per medical team - can transfer to SNF when medically appropriate (per medical team's discretion) - WBAT - wound benign, maintain dressing

## 2013-08-13 NOTE — Progress Notes (Signed)
Patient transferred from [facility] to [facility].  Written handoff provided to Eduard Clos, [facility] for review and follow up.  CSW spoke to [place], Admissions at [facility]. She met with patient's wife and daughter yesterday to complete admission paperwork in anticipation of d/c to facility when medically stable. This CSW will sign off.  Lorie Phenix. Pauline Good, [place]

## 2013-08-13 NOTE — Progress Notes (Signed)
Patient has small open area above the incision. Please evaluate the need some type of covering or stitch. Thank you, nursing

## 2013-08-13 NOTE — Progress Notes (Signed)
Physical Therapy Treatment Patient Details Name: ABDULBASIT MCCOURY MRN: [id number] DOB: [birth year] Today's Date: 08/13/2013    History of Present Illness 80 yo male s/p Rt THA ( direct anterior). Pt developed post-op ileus  and underwent EDG 08/05/13. PMHx- arthritis, anxiety, CAD (s/p stents), back surgery x 3, colectomy    PT Comments    Patient able to walk very small amount this morning however continues to be very limited by fatigue. Patient making small progress. Will continue to work on increasing ambulation and activity tolerance. Continue to recommend SNF for ongoing Physical Therapy.     Follow Up Recommendations  SNF;Supervision/Assistance - 24 hour     Equipment Recommendations  None recommended by PT    Recommendations for Other Services       Precautions / Restrictions Precautions Precautions: Fall Precaution Comments: flexiseal, direct anterior no precautions,ted hose Restrictions RLE Weight Bearing: Weight bearing as tolerated    Mobility  Bed Mobility Overal bed mobility: Needs Assistance Bed Mobility: Supine to Sit Rolling: Mod assist         General bed mobility comments: Assist for LE movement as well as for trunk elevation to full sitting position. Utilize rails and HOB elevated  Transfers Overall transfer level: Needs assistance Equipment used: Rolling walker (2 wheeled) Transfers: Sit to/from Stand Sit to Stand: Mod assist         General transfer comment: Cues for safe hand placement and technique. A to power up into standing and ensure balance  Ambulation/Gait Ambulation/Gait assistance: Min assist;+2 safety/equipment Ambulation Distance (Feet): 6 Feet Assistive device: Rolling walker (2 wheeled) Gait Pattern/deviations: Step-to pattern;Decreased step length - right;Decreased step length - left     General Gait Details: Cues for gait sequence and posture. A for RW   Stairs            Wheelchair Mobility    Modified Rankin  (Stroke Patients Only)       Balance                                    Cognition Arousal/Alertness: Awake/alert Behavior During Therapy: WFL for tasks assessed/performed Overall Cognitive Status: Within Functional Limits for tasks assessed                      Exercises Total Joint Exercises Heel Slides: AAROM;AROM;Both;10 reps;Supine Hip ABduction/ADduction: AROM;AAROM;Seated;15 reps;Both Long Arc Quad: AROM;Seated;Both;15 reps    General Comments        Pertinent Vitals/Pain no apparent distress Complain of weakness and fatigue.     Home Living                      Prior Function            PT Goals (current goals can now be found in the care plan section) Progress towards PT goals: Progressing toward goals    Frequency  Min 5X/week    PT Plan Current plan remains appropriate    Co-evaluation             End of Session Equipment Utilized During Treatment: Gait belt Activity Tolerance: Patient limited by fatigue Patient left: in chair;with call bell/phone within reach;with family/visitor present     Time: [id number] PT Time Calculation (min): 19 min  Charges:  $Gait Training: 8-22 mins  G Codes:      Jacqualyn Posey 08/13/2013, 10:41 AM 08/13/2013 Jacqualyn Posey PTA [phone] pager [phone] office

## 2013-08-13 NOTE — Progress Notes (Signed)
Occupational Therapy Treatment Patient Details Name: Gary [name] MRN: [id number] DOB: January 31, 1933 Today's Date: 08/13/2013    History of present illness 80 yo male s/p Rt THA ( direct anterior). Pt developed post-op ileus  and underwent EDG 08/05/13. PMHx- arthritis, anxiety, CAD (s/p stents), back surgery x 3, colectomy. 08/05/13 EGD + gastritis and CT suspicious for ilieus.   OT comments  This 80 yo initially admitted for direct anterior RTHA and then developed above medical issues presents to acute OT making slow progress with intent on going home v. SNF. WIll continue to follow for family ed.  Follow Up Recommendations  SNF (however wife and pt want to go home)          Precautions / Restrictions Precautions Precautions: Fall Precaution Comments: flexiseal, direct anterior no precautions,ted hose Restrictions Weight Bearing Restrictions: Yes RLE Weight Bearing: Weight bearing as tolerated              ADL                                         General ADL Comments: Pt in bed upon arrival stating that he feels uncomforable in his RLE (repostioned foot of bed and called RN for pain meds). Gave pt the option of getting up to see if this would help--he politely declined. Wife in room and I asked her from a BADL standpoint what was she the most concerned about--her answer was when therapy would start at home. She reports that he neighbor is an Therapist, sports and can A her whenever she calls, also that daughter can A as well. Family is quite familiar with tub bench due to their daughter had to use it when she broke both of her ankles in the past. I did go over th easiet dressing sequnce when assiting pt at home.                Cognition   Behavior During Therapy: WFL for tasks assessed/performed Overall Cognitive Status: Within Functional Limits for tasks assessed                                    Pertinent Vitals/ Pain       States his RLE is  uncomfortable/achy'; repositioned foot of bed and made RN aware pt requesting pain meds.         Frequency Min 2X/week     Progress Toward Goals  OT Goals(current goals can now be found in the care plan section)  Progress towards OT goals: Progressing toward goals (education)     Plan Discharge plan remains appropriate (however with and patient would rather go home)             Patient Left in bed   Nurse Communication Patient requests pain meds        Time: [id number] OT Time Calculation (min): 12 min  Charges: OT General Charges $OT Visit: 1 Procedure OT Treatments $Self Care/Home Management : 8-22 mins  Almon Register [id number] 08/13/2013, 3:31 PM

## 2013-08-13 NOTE — Progress Notes (Addendum)
INITIAL NUTRITION ASSESSMENT  DOCUMENTATION CODES Per approved criteria  -Obesity Unspecified   INTERVENTION: Ensure Complete po TID, each supplement provides 350 kcal and 13 grams of protein  Resource Breeze po TID, each supplement provides 250 kcal and 9 grams of protein  High calorie/high protein diet education provided to pt's wife. Handout provided, teach back used.   NUTRITION DIAGNOSIS: Inadequate oral intake related to c. Diff positive diarrhea as evidenced by meal completion </= 50%.   Goal: Pt to meet >/= 90% of their estimated nutrition needs   Monitor:  PO intake, supplement acceptance, I&O, skin   Reason for Assessment: MD consult  80 y.o. male  Admitting Dx: Degenerative joint disease (DJD) of hip  ASSESSMENT: Pt s/p right THA 6/23. Pt developed severe diarrhea which was positive for c. Diff (his third episode).   Nutrition-focused physical exam WNL. Per wife pt has not been eating much. His weight is 195-200 lb per wife.   Per wife pt plan to d/c home tomorrow.   Height: Ht Readings from Last 1 Encounters:  08/03/13 5\' 6"  (1.676 m)    Weight: Wt Readings from Last 1 Encounters:  08/13/13 197 lb 5 oz (89.5 kg)    Ideal Body Weight: 64.5 kg   % Ideal Body Weight: 139%  Wt Readings from Last 10 Encounters:  08/13/13 197 lb 5 oz (89.5 kg)  08/13/13 197 lb 5 oz (89.5 kg)  08/13/13 197 lb 5 oz (89.5 kg)  07/26/13 206 lb 11.2 oz (93.759 kg)  06/30/13 206 lb 3.2 oz (93.532 kg)  06/25/13 207 lb 2 oz (93.951 kg)  06/23/13 211 lb (95.709 kg)  06/03/13 208 lb (94.348 kg)  05/12/13 208 lb (94.348 kg)  01/21/13 201 lb 14.4 oz (91.581 kg)    Usual Body Weight: 206 lb   % Usual Body Weight: 96%  BMI:  Body mass index is 31.86 kg/(m^2).  Estimated Nutritional Needs: Kcal: 2100-2300 Protein: 110-125 grams Fluid: > 2 L/day  Skin: hip incision   Diet Order: Criss Rosales Meal Completion: </= 50%  EDUCATION NEEDS: -No education needs identified at  this time   Intake/Output Summary (Last 24 hours) at 08/13/13 1351 Last data filed at 08/13/13 1100  Gross per 24 hour  Intake 1202.08 ml  Output   2400 ml  Net -1197.92 ml    Last BM:  C.diff positive  Rectal pouch  Labs:   Recent Labs Lab 08/11/13 0955 08/12/13 0300 08/13/13 0605  NA 138 136* 135*  K 3.1* 3.0* 3.4*  CL 98 98 100  CO2 23 22 19   BUN 100* 94* 76*  CREATININE 3.17* 2.96* 2.31*  CALCIUM 7.5* 7.9* 7.8*  MG  --   --  2.4  PHOS  --   --  3.8  GLUCOSE 186* 169* 166*    CBG (last 3)  No results found for this basename: GLUCAP,  in the last 72 hours  Scheduled Meds: . antiseptic oral rinse  15 mL Mouth Rinse BID  . famotidine  20 mg Oral Daily  . feeding supplement (RESOURCE BREEZE)  1 Container Oral TID BM  . fluticasone  2 spray Each Nare Daily  . metroNIDAZOLE  500 mg Oral 3 times per day  . vancomycin  125 mg Oral 4 times per day    Continuous Infusions: . sodium chloride 125 mL/hr at 08/12/13 2312    Past Medical History  Diagnosis Date  . Hypertension   . Hemorrhoids   . Chronic kidney disease   .  Kidney stones   . Dyslipidemia   . Diverticulosis   . Colon polyps   . Blood transfusion   . Anemia   . GI bleeding after 07/2009    "triggered by Plavix & ASA; from my diverticulosis"  . GERD (gastroesophageal reflux disease)   . Anxiety   . Arthritis     "hips and lower back"  . Chronic back pain greater than 3 months duration   . Diarrhea   . Arthritis pain   . Cholecystitis   . Heart attack 07/2009    nonSTEMI with an occluded circumflex artery and collaterals.  . Coronary artery disease   . History of shingles   . Heart murmur   . Neuromuscular disorder   . Coronary angioplasty status 2011  . Stomach problems   . Lower extremity edema     Taking furosemide and it seems to be helping.  . Nonspecific chest pain 08/26/2012    Went to ED 08/26/12  . Hyperlipidemia   . Chest pain 03/29/11    2D Echo without contrast on 03/29/11 -  EF= 55-60%.  . H/O myocardial perfusion scan 04/04/11    For Pre-noncardiac surgery - EF = 67%, no ischemia, and is considered low risk.  . H/O Clostridium difficile infection   . Complication of anesthesia     once slow to wake up  . Adenomatous colon polyp     2010     Past Surgical History  Procedure Laterality Date  . Colon surgery      COLONOSCOPY  . Subtotal colectomy  01/24/2010  . Eye surgery  1942    "right eye was crossed; they  corrected it"  . Posterior fusion lumbar spine  08/1995    "bottom 4 vertebra"  . Back surgery  1997  . Cholecystectomy  04/12/2011    Procedure: CHOLECYSTECTOMY;  Surgeon: Adin Hector, MD;  Location: [facility];  Service: General;  Laterality: N/A;  . Cholecystectomy  04/12/2011    Procedure: LAPAROSCOPIC CHOLECYSTECTOMY;  Surgeon: Adin Hector, MD;  Location: [facility];  Service: General;  Laterality: N/A;  converted to open  . Lumbar fusion  07/22/2012  . Cardiac catheterization  08/09/2009    Circumflex 100% occluded with right-to-left collaterals, mild irregularities in the proximal and distal LAD and diagonal system, normal LV systolic function, and minor infrarenal irregularities, but no abdominal aortic aneurysm.  . Total hip arthroplasty Right 08/03/2013    Procedure: TOTAL HIP ARTHROPLASTY ANTERIOR APPROACH;  Surgeon: Hessie Dibble, MD;  Location: [facility];  Service: Orthopedics;  Laterality: Right;  . Esophagogastroduodenoscopy N/A 08/05/2013    Procedure: ESOPHAGOGASTRODUODENOSCOPY (EGD);  Surgeon: Jerene Bears, MD;  Location: [facility];  Service: Gastroenterology;  Laterality: N/A;    Maylon Peppers RD, [place], [place] Pager [phone] After Hours Pager

## 2013-08-13 NOTE — Care Management Note (Signed)
CARE MANAGEMENT NOTE 08/13/2013  Patient:  Gary [name], Gary [name]   Account Number:  [id number]  Date Initiated:  08/04/2013  Documentation initiated by:  Ricki Miller  Subjective/Objective Assessment:   80 yr old male s/p right hip hemiarthroplasty.     Action/Plan:   Case manager spoke with patient, wife and daughter concerning home health and DME needs at discharge. Choice offered. Referral called to [facility],  [place].   Anticipated DC Date:  08/14/2013   Anticipated DC Plan:  [place]  In-house referral  Clinical Social Worker      DC Planning Services  CM consult      [facility] Choice  HOME HEALTH  DURABLE MEDICAL EQUIPMENT   Choice offered to / List presented to:  C-1 Patient   DME arranged  [facility]  3-N-1      DME agency  [employer]     HH arranged  HH-2 PT      [place].   Status of service:   Medicare Important Message given?  YES (If response is "NO", the following Medicare IM given date fields will be blank) Date Medicare IM given:  08/06/2013 Medicare IM given by:  ANDERSON,JULIE Date Additional Medicare IM given:  08/09/2013 Additional Medicare IM given by:  [facility] MAYO  Discharge Disposition:    Per UR Regulation:  Reviewed for med. necessity/level of care/duration of stay  If discussed at [place] of Stay Meetings, dates discussed:    Comments:   08/13/13  [place], RN BSN Case Manager Spoke with Gary [name] concerning discharge plan. She states she wants her husband to go home, not SNF. States he is familiar with his surroundings and will improve better at home. Gary [name] states her sister-in-law is an Therapist, sports and her neighbor and they will be assisting her. Patient was established with [facility] at beginning od admission. Case manager notified Pearletha [place], [place] of discharge plan. Patient will require ambulance transport home. CM will notify social worker

## 2013-08-13 NOTE — Clinical Social Work Note (Signed)
Patient transferred to unit 5N and to this CSW today- patient is awaiting medical clearance for transfer to SNF bed at [facility]- updated SNF and will await further word from MD.   Eduard Clos, MSW, [place]

## 2013-08-13 NOTE — Progress Notes (Addendum)
[facility] PROGRESS NOTE  Gary [name] [id number] DOB: 1932/11/21 DOA: 08/03/2013 PCP: [name], MD  Interim summary:  80 year old white male past medical history of recurrent C. difficile colitis, stage III CKD and CAD admitted to the orthopedic service on 6/23 for a planned right hip arthroplasty secondary to DJD that day. On 6/25, patient started complaining of increased abdominal tenderness and distention plus episode of coffee-ground emesis. Patient was transferred to step down unit, [facility] hospitalists and gastroenterology were consulted.  Endoscopy noted signs of a mild ileus-confirmed by CT abdomen, otherwise no signs of bleeding.  Emesis felt to be secondary to esophagitis.  Hospitalists assumed care as primary attending. Patient's ileus was felt to be postop. He was managed conservatively with NG tube placement.  He also developed urinary retention-Foley catheter placed as well as some encephalopathy. In addition, by 6/26, patient's creatinine had started to rise we'll. He started developing diarrhea positive for C. Difficile and treatment was started 6/27 evening.  Over the next 5 days, patient's various medical issues improved overall. He was transferred from step down to the floor on the evening of 7/2.  Assessment/Plan: 1. Sepsis: Patient meets criteria with hypotension, tachycardia and marked  leukocytosis in the context of C. difficile colitis. Patient still looks chronically ill with worsening leukocytosis. Surgical wound looks unremarkable. CT of femur done looking for other infection, no evidence of underlying infection, but does show possible bony avulsion. Repeating blood cultures and have started IV Rocephin given Foley catheter in for over one week-this will allow for gram-negative coverage  2. Urinary retention: Resolved. Patient had Foley catheter placed on 6/26 it was kept there until patient's blood pressures were more stable. He was discontinued on 7/1 and  patient has continued to have good voiding.  3. Encephalopathy: Resolved. Patient's confusion was felt to be in part medication related-from narcotics as well as infection from C. Difficile and mild uremia from urinary retention. With limiting narcotics, treatment of infection and improvement in renal function, his mentation has since improved multifactorial,  4. Ileus: Resolved. This is in the setting of a previous partial colon resection for diverticulitis and hemorrhoidal bleeding. Initially felt to be postop, made worse by C. difficile colitis. NG tube was placed. IV Flagyl and rectal vancomycin.  Patient continued to improve and by 6/30, NG tube removed  5. Esophagitis: Improving. PPI changed to Pepcid in setting of C. difficile. No further episodes of hematemesis since.  6. Anemia: Stable. Aside from the acute episode of hematemesis, patient likely has anemia of chronic renal disease  7. Acute renal failure in the setting of Stage III chronic kidney disease: Improving. Baseline creatinine reportedly around 1.5.   Patient's creatinine peaked to as high as 5.4 on 6/28.  Given markedly elevated BUN, this is likely consistent with pre-renal azotemia from C. difficile diarrhea volume loss plus urinary retention. With IV fluids and removal of Foley catheter, creatinine continues to improve on a daily basis with most recent creatinine today at 2.31   8. Left renal cystic mass: Incidentally seen during CT of the abdomen scan. Recommendation is following resolution of illness, renal protocol MRI with and without contrast to identify neoplasm versus complex cyst  9. C. difficile colitis: Patient's third episode overall. PPI held. Has completed approximately 6 days of vancomycin and Flagyl. Flexiseal in place.  Note that his stools are always close to liquid given previous colonic resection.  Given that this is his third recurrence and that is able to take  by mouth, will change vancomycin to extend taper.    10. CAD: Stable medical issue during this hospitalization.  11. Hypertension: Patient oral antihypertensives have continued to be held secondary to hypovolemia/hypotension  12. Obesity: Patient meets criteria with BMI of 33  13. Deconditioning: Patient has been evaluated by physical and occupational therapy who recommended skilled nursing after discharge  14.  hypokalemia: Secondary to volume losses from diarrhea. Replaced.  15. Status post hip arthroplasty: Overall stable. CT of right femur done looking for other source of infection which was negative for that. Was noted to have a small bone fragment from the proximal femur adjacent to the lesser trochanter is favored to represent periprosthetic fracture associated with recent hip arthroplasty this could also represent avulsion associated with the adductor insertion on the proximal femur.  Will discuss with orthopedic surgery to make sure this is nothing of concern.  Code Status: Full code  Family Communication: Spoke with wife at the bedside.  Disposition Plan:   still acutely ill. Continue inpatient. Need to see improvement in white blood cell count and renal function before discharge  Addendum: Pt c/o increased SOB.  UA & CXR unremarkable.  More tachy.  Will move to stepdown.   Consultants:  [facility]  Orthopedic surgery  Procedures:  Endoscopy done 6/25: Mild esophagitis noted. Food residue consistent with postop ileus noted. Normal duodenum. No signs of bleeding.  Antibiotics:   Flagyl 6/27, initially IV, changed to oral, stopped on 7/3    vancomycin 6/28 initially rectal, changed to by mouth on 6/30-continue for 6 weeks  IV Rocephin 7/3-present   HPI/Subjective: Patient not feeling well. Very weak. Complains of chills and rigors  Objective: Filed Vitals:   08/13/13 1344  BP: 122/55  Pulse: 95  Temp:   Resp:     Intake/Output Summary (Last 24 hours) at 08/13/13 1552 Last data filed at  08/13/13 1100  Gross per 24 hour  Intake 1202.08 ml  Output   2400 ml  Net -1197.92 ml   Filed Weights   08/11/13 0400 08/12/13 2321 08/13/13 0500  Weight: 89.3 kg (196 lb 13.9 oz) 87.9 kg (193 lb 12.6 oz) 89.5 kg (197 lb 5 oz)    Exam:   General:  Mild distress, rigors  Cardiovascular: Regular rhythm, borderline tachycardia  Respiratory: Clear to auscultation bilaterally  Abdomen: Soft, mild distention, hypoactive bowel sounds, nontender  Musculoskeletal: No clubbing or cyanosis, trace pitting edema   Data Reviewed: Basic Metabolic Panel:  Recent Labs Lab 08/09/13 0255 08/10/13 0426 08/11/13 0955 08/12/13 0300 08/13/13 0605  NA 140 145 138 136* 135*  K 3.7 3.7 3.1* 3.0* 3.4*  CL 101 103 98 98 100  CO2 19 26 23 22 19   GLUCOSE 178* 178* 186* 169* 166*  BUN 129* 121* 100* 94* 76*  CREATININE 5.39* 4.44* 3.17* 2.96* 2.31*  CALCIUM 7.8* 7.9* 7.5* 7.9* 7.8*  MG  --   --   --   --  2.4  PHOS  --   --   --   --  3.8   Liver Function Tests:  Recent Labs Lab 08/07/13 1406 08/13/13 0605  AST 32 19  ALT 16 12  ALKPHOS 28* 52  BILITOT 0.8 0.6  PROT 5.8* 5.4*  ALBUMIN 2.2* 2.3*   No results found for this basename: LIPASE, AMYLASE,  in the last 168 hours No results found for this basename: AMMONIA,  in the last 168 hours CBC:  Recent Labs Lab 08/07/13 1406 08/09/13 0255  08/10/13 0426 08/11/13 0457 08/11/13 0955 08/12/13 0305 08/13/13 0605  WBC 3.2* 7.3 14.0* 19.5*  --  21.1* 24.2*  NEUTROABS 2.3  --   --   --  19.4*  --  21.8*  HGB 9.6* 9.4* 10.4* 10.2*  --  10.6* 10.6*  HCT 28.7* 27.2* 31.0* 30.0*  --  31.4* 30.9*  MCV 90.0 85.8 87.8 87.5  --  88.5 86.8  PLT 179 216 241 283  --  323 330   Cardiac Enzymes: No results found for this basename: CKTOTAL, CKMB, CKMBINDEX, TROPONINI,  in the last 168 hours BNP (last 3 results) No results found for this basename: PROBNP,  in the last 8760 hours CBG: No results found for this basename: GLUCAP,  in the  last 168 hours  Recent Results (from the past 240 hour(s))  CLOSTRIDIUM DIFFICILE BY PCR     Status: Abnormal   Collection Time    08/07/13  2:45 PM      Result Value Ref Range Status   C difficile by pcr POSITIVE (*) NEGATIVE Final   Comment: CRITICAL RESULT CALLED TO, READ BACK BY AND VERIFIED WITH:     Novella Rob RN AT [id number] 08/07/13 BY [place]  URINE CULTURE     Status: None   Collection Time    08/11/13  8:51 AM      Result Value Ref Range Status   Specimen Description URINE, CATHETERIZED   Final   Special Requests NONE   Final   Culture  Setup Time     Final   Value: 08/11/2013 12:55     Performed at [facility]     Final   Value: NO GROWTH     Performed at [facility]   Culture     Final   Value: NO GROWTH     Performed at [facility]   Report Status 08/12/2013 FINAL   Final     Studies: Dg Chest 2 View  08/12/2013    IMPRESSION: No acute abnormality noted. No significant change from the prior study is seen.   Electronically Signed   By: Inez Catalina M.D.   On: 08/12/2013 08:19   Ct Femur Right Wo Contrast  08/12/2013     IMPRESSION: 1. No cause for leukocytosis identified in CT of the RIGHT thigh. 2. Small bone fragment from the proximal femur adjacent to the lesser trochanter is favored to represent periprosthetic fracture associated with recent hip arthroplasty this could also represent avulsion associated with the adductor insertion on the proximal femur; underlying osteolytic lesion cannot be excluded however is considered unlikely. If the patient has a history of malignancy, whole-body bone scan could be considered in follow-up to assess for other foci of osseous disease.   Electronically Signed   By: Dereck Ligas M.D.   On: 08/12/2013 14:32   Dg Chest Port 1 View  08/13/2013     IMPRESSION: Stable cardiac enlargement and central vascular congestion.  Low lung volumes with vascular crowding and bibasilar atelectasis.    Electronically Signed   By: Kalman Jewels M.D.   On: 08/13/2013 14:44    Scheduled Meds: . antiseptic oral rinse  15 mL Mouth Rinse BID  . famotidine  20 mg Oral Daily  . feeding supplement (ENSURE COMPLETE)  237 mL Oral TID BM  . feeding supplement (RESOURCE BREEZE)  1 Container Oral TID BM  . fluticasone  2 spray Each Nare Daily  . metroNIDAZOLE  500 mg Oral 3 times per day  . vancomycin  125 mg Oral 4 times per day   Continuous Infusions: . sodium chloride 100 mL/hr at 08/13/13 1551    Principal Problem:   Degenerative joint disease (DJD) of hip Active Problems:   BPH (benign prostatic hyperplasia)   Ileus, postoperative   S/P total hip arthroplasty   Urinary retention   Upper GI bleeding   Acute esophagitis   Renal mass, left   Hypotension, unspecified   Complex renal cyst   Severe sepsis   Acute blood loss anemia   Encephalopathy   Hypokalemia    Time spent: 45 minutes    [facility] Pager [phone]. If 7PM-7AM, please contact night-coverage at [email or web address], password [facility] 08/13/2013, 3:52 PM  LOS: 10 days

## 2013-08-14 ENCOUNTER — Inpatient Hospital Stay (HOSPITAL_COMMUNITY): Payer: Medicare Other

## 2013-08-14 DIAGNOSIS — E872 Acidosis, unspecified: Secondary | ICD-10-CM

## 2013-08-14 DIAGNOSIS — I480 Paroxysmal atrial fibrillation: Secondary | ICD-10-CM | POA: Diagnosis not present

## 2013-08-14 LAB — CBC WITH DIFFERENTIAL/PLATELET
Basophils Absolute: 0 10*3/uL (ref 0.0–0.1)
Basophils Relative: 0 % (ref 0–1)
EOS PCT: 1 % (ref 0–5)
Eosinophils Absolute: 0.2 10*3/uL (ref 0.0–0.7)
HCT: 29.7 % — ABNORMAL LOW (ref 39.0–52.0)
Hemoglobin: 10.1 g/dL — ABNORMAL LOW (ref 13.0–17.0)
LYMPHS ABS: 1 10*3/uL (ref 0.7–4.0)
Lymphocytes Relative: 4 % — ABNORMAL LOW (ref 12–46)
MCH: 29.6 pg (ref 26.0–34.0)
MCHC: 34 g/dL (ref 30.0–36.0)
MCV: 87.1 fL (ref 78.0–100.0)
MONOS PCT: 5 % (ref 3–12)
Monocytes Absolute: 1.2 10*3/uL — ABNORMAL HIGH (ref 0.1–1.0)
NEUTROS ABS: 21.5 10*3/uL — AB (ref 1.7–7.7)
Neutrophils Relative %: 90 % — ABNORMAL HIGH (ref 43–77)
PLATELETS: 422 10*3/uL — AB (ref 150–400)
RBC: 3.41 MIL/uL — AB (ref 4.22–5.81)
RDW: 15 % (ref 11.5–15.5)
WBC: 23.9 10*3/uL — ABNORMAL HIGH (ref 4.0–10.5)

## 2013-08-14 LAB — COMPREHENSIVE METABOLIC PANEL
ALK PHOS: 52 U/L (ref 39–117)
ALT: 10 U/L (ref 0–53)
AST: 15 U/L (ref 0–37)
Albumin: 2.2 g/dL — ABNORMAL LOW (ref 3.5–5.2)
Anion gap: 21 — ABNORMAL HIGH (ref 5–15)
BUN: 66 mg/dL — ABNORMAL HIGH (ref 6–23)
CO2: 14 meq/L — AB (ref 19–32)
Calcium: 7.8 mg/dL — ABNORMAL LOW (ref 8.4–10.5)
Chloride: 102 mEq/L (ref 96–112)
Creatinine, Ser: 2.23 mg/dL — ABNORMAL HIGH (ref 0.50–1.35)
GFR, EST AFRICAN AMERICAN: 31 mL/min — AB (ref >90)
GFR, EST NON AFRICAN AMERICAN: 26 mL/min — AB (ref >90)
GLUCOSE: 171 mg/dL — AB (ref 70–99)
Potassium: 3 mEq/L — ABNORMAL LOW (ref 3.7–5.3)
SODIUM: 137 meq/L (ref 137–147)
Total Bilirubin: 0.4 mg/dL (ref 0.3–1.2)
Total Protein: 5.3 g/dL — ABNORMAL LOW (ref 6.0–8.3)

## 2013-08-14 LAB — MAGNESIUM: Magnesium: 2.2 mg/dL (ref 1.5–2.5)

## 2013-08-14 LAB — PHOSPHORUS: Phosphorus: 4.1 mg/dL (ref 2.3–4.6)

## 2013-08-14 LAB — PRO B NATRIURETIC PEPTIDE: Pro B Natriuretic peptide (BNP): 751.1 pg/mL — ABNORMAL HIGH (ref 0–450)

## 2013-08-14 LAB — TROPONIN I

## 2013-08-14 LAB — LACTIC ACID, PLASMA: LACTIC ACID, VENOUS: 2.6 mmol/L — AB (ref 0.5–2.2)

## 2013-08-14 MED ORDER — SODIUM BICARBONATE 8.4 % IV SOLN
INTRAVENOUS | Status: AC
  Administered 2013-08-14 (×2): via INTRAVENOUS
  Filled 2013-08-14 (×6): qty 100

## 2013-08-14 MED ORDER — POTASSIUM CHLORIDE CRYS ER 20 MEQ PO TBCR
40.0000 meq | EXTENDED_RELEASE_TABLET | Freq: Once | ORAL | Status: AC
  Administered 2013-08-14: 40 meq via ORAL
  Filled 2013-08-14: qty 2

## 2013-08-14 MED ORDER — MAGNESIUM CITRATE PO SOLN
1.0000 | Freq: Once | ORAL | Status: AC
  Administered 2013-08-14: 1 via ORAL
  Filled 2013-08-14: qty 296

## 2013-08-14 MED ORDER — FUROSEMIDE 10 MG/ML IJ SOLN: 20.0000 mg | Freq: Once | INTRAMUSCULAR | Status: DC

## 2013-08-14 NOTE — Progress Notes (Signed)
[facility] PROGRESS NOTE  Gary [name] [id number] DOB: [birth year] DOA: 08/03/2013 PCP: [name], MD  Interim summary:  80 year old white male past medical history of recurrent C. difficile colitis, stage III CKD and CAD admitted to the orthopedic service on 6/23 for a planned right hip arthroplasty secondary to DJD that day. On 6/25, patient started complaining of increased abdominal tenderness and distention plus episode of coffee-ground emesis. Patient was transferred to step down unit, [facility] and gastroenterology were consulted.  Endoscopy noted signs of a mild ileus-confirmed by CT abdomen, otherwise no signs of bleeding.  Emesis felt to be secondary to esophagitis.  Hospitalists assumed care as primary attending. Patient's ileus was felt to be postop. He was managed conservatively with NG tube placement.  He also developed urinary retention-Foley catheter placed as well as some encephalopathy. In addition, by 6/26, patient's creatinine had started to rise we'll. He started developing diarrhea positive for C. Difficile and treatment was started 6/27 evening.  Over the next 5 days, patient's various medical issues improved overall. He was transferred from step down to the floor on the evening of 7/2.  Pt's WBC has remained persistently elevated & with concerns that his sepsis was not yet resolving, he was transferred back to stepdown on 7/3.    Assessment/Plan: 1. Sepsis: Patient meets criteria with hypotension, tachycardia and marked  leukocytosis in the context of C. difficile colitis. Patient still looks chronically ill with worsening leukocytosis. Surgical wound looks unremarkable. CT of femur done looking for other infection, no evidence of underlying infection, but does show possible bony avulsion. Repeating blood cultures and gave IV Rocephin x 1 dose.  Patient's white blood cell count remains elevated as of 7/4 and now with lactic acidosis. He is breathing  comfortably and normal UA. I do not think that this is infectious other than C. difficile which is being adequately treated. See below  2. Lactic acidosis/metabolic acidosis: Anion gap on 7/4 increased to 21. White blood cell count clinically unchanged.  Troponin normal. Lactic acid level slightly elevated at 2.6. Abdominal x-ray noted mild persistent ileus. Patient clinically better. Discussed with critical care. This may be from bicarbonate losses from stool which have not been replaced. Started bicarbonate drip.  3. Atrial fibrillation? She has history of paroxysmal atrial fibrillation which last occurred during a septic episode. This morning he was tachycardic and looked briefly be in atrial fibrillation. By the time EKG was checked, patient looked to be back in normal sinus rhythm with PACs which is what the EKG showed. Monitor closely.  4. Urinary retention: Resolved. Patient had Foley catheter placed on 6/26 it was kept there until patient's blood pressures were more stable. He was discontinued on 7/1 and patient has continued to have good voiding.  5. Encephalopathy: Resolved. Patient's confusion was felt to be multifactorial, in part medication related-from narcotics as well as infection from C. Difficile and mild uremia from urinary retention. With limiting narcotics, treatment of infection and improvement in renal function, his mentation has since improved.  6. Ileus: Resolved. This is in the setting of a previous partial colon resection for diverticulitis and hemorrhoidal bleeding. Initially felt to be postop, made worse by C. difficile colitis. NG tube was placed. IV Flagyl and rectal vancomycin.  Patient continued to improve and by 6/30, NG tube removed.  Given lactic acidosis seen 7/4, axr done & noted persistent ileus.  Pt himself is asymptomatic of this, so we are continuing him on   7. Esophagitis:  Improving. PPI changed to Pepcid in setting of C. difficile. No further episodes of  hematemesis since.  8. Anemia: Stable. Aside from the acute episode of hematemesis, patient likely has anemia of chronic renal disease  9. Acute renal failure in the setting of Stage III chronic kidney disease: Improving. Baseline creatinine reportedly around 1.5.   Patient's creatinine peaked to as high as 5.4 on 6/28.  Given markedly elevated BUN, this is likely consistent with pre-renal azotemia from C. difficile diarrhea volume loss plus urinary retention. With IV fluids and removal of Foley catheter, creatinine continues to improve on a daily basis with most recent creatinine today at 2.27  10. Left renal cystic mass: Incidentally seen during CT of the abdomen scan. Recommendation is following resolution of illness, renal protocol MRI with and without contrast to identify neoplasm versus complex cyst  11. C. difficile colitis: Patient's third episode overall. PPI held. Has completed approximately 6 days of vancomycin and Flagyl. Flexiseal in place.  Note that his stools are always close to liquid given previous colonic resection.  Given that this is his third recurrence and that is able to take by mouth, will change vancomycin to extend taper.   12. CAD: Stable medical issue during this hospitalization.  13. Hypertension: Patient oral antihypertensives have continued to be held secondary to hypovolemia/hypotension  14. Obesity: Patient meets criteria with BMI of 33  15. Deconditioning: Patient has been evaluated by physical and occupational therapy who recommended skilled nursing after discharge  16.  hypokalemia: Secondary to volume losses from diarrhea. Continuing to replace.  17. Status post hip arthroplasty: Overall stable. CT of right femur done looking for other source of infection which was negative for that. Was noted to have a small bone fragment from the proximal femur adjacent to the lesser trochanter is favored to represent periprosthetic fracture associated with recent hip  arthroplasty this could also represent avulsion associated with the adductor insertion on the proximal femur.  Will discuss with orthopedic surgery to make sure this is nothing of concern.   Code Status: Full code  Family Communication: Spoke with wife yesterday  Disposition Plan:   still acutely ill. Continue inpatient. Need to see improvement in white blood cell count and improvement in acidosis before transfer to floor and renal function before discharge     Consultants:  [facility]  Orthopedic surgery  Procedures:  Endoscopy done 6/25: Mild esophagitis noted. Food residue consistent with postop ileus noted. Normal duodenum. No signs of bleeding.  Antibiotics:   Flagyl 6/27, initially IV, changed to oral, stopped on 7/3    vancomycin 6/28 initially rectal, changed to by mouth on 6/30-continue for 6 weeks  IV Rocephin 7/3-present   HPI/Subjective: Patient feeling better. No abdominal pain. Breathing comfortably on room air. Continuing to have large amount of stool output.  Objective: Filed Vitals:   08/14/13 1143  BP: 148/66  Pulse: 101  Temp: 97.7 F (36.5 C)  Resp: 19    Intake/Output Summary (Last 24 hours) at 08/14/13 1223 Last data filed at 08/14/13 1144  Gross per 24 hour  Intake    360 ml  Output   3225 ml  Net  -2865 ml   Filed Weights   08/12/13 2321 08/13/13 0500 08/14/13 0438  Weight: 87.9 kg (193 lb 12.6 oz) 89.5 kg (197 lb 5 oz) 92.2 kg (203 lb 4.2 oz)    Exam:   General:  Alert and oriented x3, no acute distress  Cardiovascular: Regular rhythm, borderline tachycardia  Respiratory: Clear to auscultation bilaterally  Abdomen: Soft, mild distention, hypoactive bowel sounds, nontender  Musculoskeletal: No clubbing or cyanosis, trace pitting edema   Data Reviewed: Basic Metabolic Panel:  Recent Labs Lab 08/10/13 0426 08/11/13 0955 08/12/13 0300 08/13/13 0605 08/14/13 0244  NA 145 138 136* 135* 137  K 3.7 3.1* 3.0*  3.4* 3.0*  CL 103 98 98 100 102  CO2 26 23 22 19  14*  GLUCOSE 178* 186* 169* 166* 171*  BUN 121* 100* 94* 76* 66*  CREATININE 4.44* 3.17* 2.96* 2.31* 2.23*  CALCIUM 7.9* 7.5* 7.9* 7.8* 7.8*  MG  --   --   --  2.4 2.2  PHOS  --   --   --  3.8 4.1   Liver Function Tests:  Recent Labs Lab 08/07/13 1406 08/13/13 0605 08/14/13 0244  AST 32 19 15  ALT 16 12 10   ALKPHOS 28* 52 52  BILITOT 0.8 0.6 0.4  PROT 5.8* 5.4* 5.3*  ALBUMIN 2.2* 2.3* 2.2*   No results found for this basename: LIPASE, AMYLASE,  in the last 168 hours No results found for this basename: AMMONIA,  in the last 168 hours CBC:  Recent Labs Lab 08/07/13 1406  08/11/13 0457 08/11/13 0955 08/12/13 0305 08/13/13 0605 08/13/13 1908 08/14/13 0244  WBC 3.2*  < > 19.5*  --  21.1* 24.2* 23.4* 23.9*  NEUTROABS 2.3  --   --  19.4*  --  21.8*  --  21.5*  HGB 9.6*  < > 10.2*  --  10.6* 10.6* 12.7* 10.1*  HCT 28.7*  < > 30.0*  --  31.4* 30.9* 37.7* 29.7*  MCV 90.0  < > 87.5  --  88.5 86.8 88.3 87.1  PLT 179  < > 283  --  323 330 397 422*  < > = values in this interval not displayed. Cardiac Enzymes:  Recent Labs Lab 08/14/13 0955  TROPONINI <0.30   BNP (last 3 results)  Recent Labs  08/14/13 0955  PROBNP 751.1*   CBG: No results found for this basename: GLUCAP,  in the last 168 hours  Recent Results (from the past 240 hour(s))  CLOSTRIDIUM DIFFICILE BY PCR     Status: Abnormal   Collection Time    08/07/13  2:45 PM      Result Value Ref Range Status   C difficile by pcr POSITIVE (*) NEGATIVE Final   Comment: CRITICAL RESULT CALLED TO, READ BACK BY AND VERIFIED WITH:     Novella Rob RN AT [id number] 08/07/13 BY [place]  URINE CULTURE     Status: None   Collection Time    08/11/13  8:51 AM      Result Value Ref Range Status   Specimen Description URINE, CATHETERIZED   Final   Special Requests NONE   Final   Culture  Setup Time     Final   Value: 08/11/2013 12:55     Performed at [place]     Final   Value: NO GROWTH     Performed at [facility]   Culture     Final   Value: NO GROWTH     Performed at [facility]   Report Status 08/12/2013 FINAL   Final     Studies: Dg Chest 2 View  08/12/2013    IMPRESSION: No acute abnormality noted. No significant change from the prior study is seen.   Electronically Signed   By: Elta Guadeloupe  Lukens M.D.   On: 08/12/2013 08:19   Ct Femur Right Wo Contrast  08/12/2013     IMPRESSION: 1. No cause for leukocytosis identified in CT of the RIGHT thigh. 2. Small bone fragment from the proximal femur adjacent to the lesser trochanter is favored to represent periprosthetic fracture associated with recent hip arthroplasty this could also represent avulsion associated with the adductor insertion on the proximal femur; underlying osteolytic lesion cannot be excluded however is considered unlikely. If the patient has a history of malignancy, whole-body bone scan could be considered in follow-up to assess for other foci of osseous disease.   Electronically Signed   By: Dereck Ligas M.D.   On: 08/12/2013 14:32   Dg Chest Port 1 View  08/13/2013     IMPRESSION: Stable cardiac enlargement and central vascular congestion.  Low lung volumes with vascular crowding and bibasilar atelectasis.   Electronically Signed   By: Kalman Jewels M.D.   On: 08/13/2013 14:44    Scheduled Meds: . antiseptic oral rinse  15 mL Mouth Rinse BID  . famotidine  20 mg Oral Daily  . feeding supplement (ENSURE COMPLETE)  237 mL Oral TID BM  . feeding supplement (RESOURCE BREEZE)  1 Container Oral TID BM  . fluticasone  2 spray Each Nare Daily  . vancomycin  125 mg Oral QID   Followed by  . [START ON 08/17/2013] vancomycin  125 mg Oral BID   Followed by  . [START ON 08/25/2013] vancomycin  125 mg Oral Daily   Followed by  . [START ON 09/01/2013] vancomycin  125 mg Oral QODAY   Followed by  . [START ON 09/09/2013] vancomycin  125 mg Oral Q3 days    Continuous Infusions: .  sodium bicarbonate  infusion 1000 mL      Principal Problem:   Severe sepsis Active Problems:   BPH (benign prostatic hyperplasia)   Ileus, postoperative   C. difficile colitis   S/P total hip arthroplasty   Degenerative joint disease (DJD) of hip   Urinary retention   Upper GI bleeding   Acute esophagitis   Renal mass, left   Hypotension, unspecified   Complex renal cyst   Acute blood loss anemia   Encephalopathy   Hypokalemia   Acute renal failure   Chronic kidney disease, stage 3   Anemia in chronic kidney disease   PAF (paroxysmal atrial fibrillation)   Metabolic acidosis    Time spent: 35 minutes    [facility] Pager [phone]. If 7PM-7AM, please contact night-coverage at [email or web address], password [facility] 08/14/2013, 12:23 PM  LOS: 11 days

## 2013-08-14 NOTE — Progress Notes (Signed)
Patient s/p hip replacement, doing well from ortho standpoint.  Continues to have various medical issues as reflected in chart   Physical Exam:   BP 95/57  Pulse 82  Temp(Src) 97.7 F (36.5 C) (Oral)  Resp 23  Ht 5\' 6"  (1.676 m)  Wt 203 lb 4.2 oz (92.2 kg)  BMI 32.82 kg/m2  SpO2 89%  Wbc: 23.9 Hg: 10.1  Incision intact with very small opening at top pf incision and dried bloody drainage  NVI   S/p right THA on 6/23 with various ongoing medical issues, managed by the medical team. Patient was recently transferred to stepdown for ongoing management of his medical issues, including sepsis, hypotension, C-diff, anemia, ARF.  - C-diff, ilieus, ARF, etc. management per medical team  - can transfer to SNF when medically appropriate (per medical team's discretion). It appears that this will not happen any time soon, as patient continues to be worked up and managed for multiple issues - WBAT  - wound benign, maintain dressing

## 2013-08-14 NOTE — Progress Notes (Addendum)
Physical Therapy Treatment Patient Details Name: Gary [name] MRN: [id number] DOB: 1932/03/08 Today's Date: 08/14/2013    History of Present Illness 80 yo male s/p Rt THA ( direct anterior). Pt developed post-op ileus  and underwent EDG 08/05/13. PMHx- arthritis, anxiety, CAD (s/p stents), back surgery x 3, colectomy. 08/05/13 EGD + gastritis and CT suspicious for ilieus.    PT Comments    Patient limited by SOB and weakness today.  Able to transfer only.  Follow Up Recommendations  SNF;Supervision/Assistance - 24 hour     Equipment Recommendations  None recommended by PT    Recommendations for Other Services       Precautions / Restrictions Precautions Precautions: Fall Precaution Comments: Direct anterior THA with no precautions; TED hose Restrictions Weight Bearing Restrictions: Yes RLE Weight Bearing: Weight bearing as tolerated    Mobility  Bed Mobility Overal bed mobility: Needs Assistance Bed Mobility: Supine to Sit     Supine to sit: Mod assist;+2 for physical assistance     General bed mobility comments: Verbal cues for technique.  Assist to bring RLE off of bed and raise trunk to sitting.  Used bed pads to assist patient.  Transfers Overall transfer level: Needs assistance Equipment used: Rolling walker (2 wheeled) Transfers: Sit to/from [facility] Sit to Stand: Mod assist;+2 safety/equipment Stand pivot transfers: Min assist;+2 safety/equipment       General transfer comment: Verbal cues for hand placement and technique.  Assist to rise to standing and for balance.  In standing, patient reports feeling SOB and weak.  Unable to ambulate.  Able to take steps to pivot to chair.  Ambulation/Gait                 Stairs            Wheelchair Mobility    Modified Rankin (Stroke Patients Only)       Balance                                    Cognition Arousal/Alertness: Awake/alert Behavior During  Therapy: WFL for tasks assessed/performed Overall Cognitive Status: Within Functional Limits for tasks assessed                      Exercises      General Comments        Pertinent Vitals/Pain During transfer/mobility, patient with DOE and HR increased to 140.  Once in chair, returned to 103.    Home Living                      Prior Function            PT Goals (current goals can now be found in the care plan section) Progress towards PT goals: Progressing toward goals    Frequency  Min 5X/week    PT Plan Current plan remains appropriate    Co-evaluation             End of Session Equipment Utilized During Treatment: Gait belt Activity Tolerance: Patient limited by fatigue Patient left: in chair;with call bell/phone within reach     Time: 1324-1340 PT Time Calculation (min): 16 min  Charges:  $Therapeutic Activity: 8-22 mins                    G Codes:      Despina Pole  08/14/2013, 1:49 PM Carita Pian. Sanjuana Kava, [place] Pager [phone]

## 2013-08-15 DIAGNOSIS — N039 Chronic nephritic syndrome with unspecified morphologic changes: Secondary | ICD-10-CM

## 2013-08-15 DIAGNOSIS — D631 Anemia in chronic kidney disease: Secondary | ICD-10-CM

## 2013-08-15 LAB — COMPREHENSIVE METABOLIC PANEL
ALBUMIN: 2.2 g/dL — AB (ref 3.5–5.2)
ALK PHOS: 60 U/L (ref 39–117)
ALT: 9 U/L (ref 0–53)
AST: 17 U/L (ref 0–37)
Anion gap: 16 — ABNORMAL HIGH (ref 5–15)
BILIRUBIN TOTAL: 0.5 mg/dL (ref 0.3–1.2)
BUN: 50 mg/dL — ABNORMAL HIGH (ref 6–23)
CHLORIDE: 100 meq/L (ref 96–112)
CO2: 22 mEq/L (ref 19–32)
Calcium: 7.8 mg/dL — ABNORMAL LOW (ref 8.4–10.5)
Creatinine, Ser: 2 mg/dL — ABNORMAL HIGH (ref 0.50–1.35)
GFR calc Af Amer: 35 mL/min — ABNORMAL LOW (ref >90)
GFR, EST NON AFRICAN AMERICAN: 30 mL/min — AB (ref >90)
Glucose, Bld: 158 mg/dL — ABNORMAL HIGH (ref 70–99)
POTASSIUM: 3 meq/L — AB (ref 3.7–5.3)
Sodium: 138 mEq/L (ref 137–147)
Total Protein: 5.3 g/dL — ABNORMAL LOW (ref 6.0–8.3)

## 2013-08-15 LAB — CBC WITH DIFFERENTIAL/PLATELET
BASOS ABS: 0 10*3/uL (ref 0.0–0.1)
Basophils Relative: 0 % (ref 0–1)
EOS ABS: 0.2 10*3/uL (ref 0.0–0.7)
Eosinophils Relative: 1 % (ref 0–5)
HEMATOCRIT: 27.3 % — AB (ref 39.0–52.0)
HEMOGLOBIN: 9.4 g/dL — AB (ref 13.0–17.0)
LYMPHS ABS: 1 10*3/uL (ref 0.7–4.0)
Lymphocytes Relative: 5 % — ABNORMAL LOW (ref 12–46)
MCH: 29.7 pg (ref 26.0–34.0)
MCHC: 34.4 g/dL (ref 30.0–36.0)
MCV: 86.1 fL (ref 78.0–100.0)
Monocytes Absolute: 0.8 10*3/uL (ref 0.1–1.0)
Monocytes Relative: 4 % (ref 3–12)
NEUTROS ABS: 18.4 10*3/uL — AB (ref 1.7–7.7)
Neutrophils Relative %: 90 % — ABNORMAL HIGH (ref 43–77)
Platelets: 426 10*3/uL — ABNORMAL HIGH (ref 150–400)
RBC: 3.17 MIL/uL — ABNORMAL LOW (ref 4.22–5.81)
RDW: 15 % (ref 11.5–15.5)
WBC: 20.4 10*3/uL — ABNORMAL HIGH (ref 4.0–10.5)

## 2013-08-15 LAB — PHOSPHORUS: Phosphorus: 3.4 mg/dL (ref 2.3–4.6)

## 2013-08-15 LAB — MAGNESIUM: Magnesium: 1.9 mg/dL (ref 1.5–2.5)

## 2013-08-15 MED ORDER — SODIUM CHLORIDE 0.9 % IV SOLN
INTRAVENOUS | Status: DC
  Administered 2013-08-15 – 2013-08-17 (×2): via INTRAVENOUS

## 2013-08-15 MED ORDER — WHITE PETROLATUM GEL
Status: AC
  Administered 2013-08-15: 0.2
  Filled 2013-08-15: qty 5

## 2013-08-15 MED ORDER — POTASSIUM CHLORIDE CRYS ER 20 MEQ PO TBCR
40.0000 meq | EXTENDED_RELEASE_TABLET | Freq: Once | ORAL | Status: AC
  Administered 2013-08-15: 40 meq via ORAL
  Filled 2013-08-15: qty 2

## 2013-08-15 NOTE — Progress Notes (Signed)
Physical Therapy Treatment Patient Details Name: Gary [name] MRN: [id number] DOB: [birth year] Today's Date: 08-23-13    History of Present Illness 80 yo male s/p Rt THA ( direct anterior). Pt developed post-op ileus  and underwent EDG 08/05/13. PMHx- arthritis, anxiety, CAD (s/p stents), back surgery x 3, colectomy. 08/05/13 EGD + gastritis and CT suspicious for ilieus.    PT Comments    Patient performing exercises only today.  Follow Up Recommendations  SNF;Supervision/Assistance - 24 hour (Wife reports she wants to take patient home.)     Equipment Recommendations  None recommended by PT    Recommendations for Other Services       Precautions / Restrictions Precautions Precautions: Fall Precaution Comments: Direct anterior THA with no precautions; TED hose Restrictions Weight Bearing Restrictions: Yes RLE Weight Bearing: Weight bearing as tolerated    Mobility  Bed Mobility               General bed mobility comments: Patient on side with rectal tube - leaking.  Declined mobility.  Agreed to exercises  Transfers                    Ambulation/Gait                 Stairs            Wheelchair Mobility    Modified Rankin (Stroke Patients Only)       Balance                                    Cognition Arousal/Alertness: Lethargic Behavior During Therapy: WFL for tasks assessed/performed Overall Cognitive Status: Within Functional Limits for tasks assessed                      Exercises Total Joint Exercises Ankle Circles/Pumps: AROM;Both;10 reps;Supine Quad Sets: AROM;Both;10 reps;Supine Short Arc Quad: AROM;Both;10 reps;Supine Heel Slides: AROM;Both;10 reps;Supine Hip ABduction/ADduction: AAROM;Both;10 reps;Supine    General Comments        Pertinent Vitals/Pain     Home Living                      Prior Function            PT Goals (current goals can now be found in the care  plan section) Progress towards PT goals: Not progressing toward goals - comment (Medical issues limiting patient)    Frequency  Min 5X/week    PT Plan Current plan remains appropriate    Co-evaluation             End of Session   Activity Tolerance: Patient limited by fatigue Patient left: in bed;with call bell/phone within reach;with family/visitor present     Time: [id number] PT Time Calculation (min): 21 min  Charges:  $Therapeutic Exercise: 8-22 mins                    G Codes:      Despina Pole 08-23-2013, 7:41 PM Carita Pian. Sanjuana Kava, [place] Pager [phone]

## 2013-08-15 NOTE — Progress Notes (Signed)
Admitted patient from stepdown with flexi-seal,with some leakage from insertion site,cleaned,moisture associated skin damage observed from peri-anal,bilateral inguinal region and the entire scrotal area.Nystatin powder applied with tick epc cream.otherwise no skin breakdown noted.Patient educated to be on most his side,to prevent leakage from the site as well as to protect his skin on his buttocks.Patient agreed on skin breakdown prevention protocol.

## 2013-08-15 NOTE — Progress Notes (Signed)
Patient comfortable, still being managed for various medical issues Not much has changed from an orthopedic standpoint  BP 120/55  Pulse 96  Temp(Src) 98.6 F (37 C) (Oral)  Resp 23  Ht 5\' 6"  (1.676 m)  Wt 192 lb 7.4 oz (87.3 kg)  BMI 31.08 kg/m2  SpO2 96%  Incision continues to appear benign, with a small opening at very top of wound which appears stable NVI  S/p right THA, doing well from that standpoint, with ongoing medical issues being appropriately managed by the medical team  - WBAT  - wound benign, maintain dressing, will continue to monitor wound - from orthopedic standpoint, can transfer to SNF when medically appropriate (per medical team's discretion). This is unlikely to happen soon, given patient's ongoing medical issues

## 2013-08-15 NOTE — Progress Notes (Signed)
[facility] PROGRESS NOTE  Gary [name] [id number] DOB: [birth year] DOA: 08/03/2013 PCP: [name], MD  Interim summary:  80 year old white male past medical history of recurrent C. difficile colitis, stage III CKD and CAD admitted to the orthopedic service on 6/23 for a planned right hip arthroplasty secondary to DJD that day. On 6/25, patient started complaining of increased abdominal tenderness and distention plus episode of coffee-ground emesis. Patient was transferred to step down unit, [facility] and gastroenterology were consulted.  Endoscopy noted signs of a mild ileus-confirmed by CT abdomen, otherwise no signs of bleeding.  Emesis felt to be secondary to esophagitis.  Hospitalists assumed care as primary attending. Patient's ileus was felt to be postop. He was managed conservatively with NG tube placement.  He also developed urinary retention-Foley catheter placed as well as some encephalopathy. In addition, by 6/26, patient's creatinine had started to rise we'll. He started developing diarrhea positive for C. Difficile and treatment was started 6/27 evening.  Over the next 5 days, patient's various medical issues improved overall. He was transferred from step down to the floor on the evening of 7/2.  Pt's WBC  remained persistently elevated & with concerns that his sepsis was not yet resolving, he was transferred back to stepdown on 7/3.    Since transferred to step down, patient has improved. I replacing volume and bicarbonate, patient clinically better. Renal function continues to improve. Transferred out of the unit 7/5.  Assessment/Plan:  1. lSepsis: Patient meets criteria with hypotension, tachycardia and marked  leukocytosis in the context of C. difficile colitis.   However, by treating bicarbonate losses with drip, white blood cell count has noticeably declined as of 7/5. We treat this more stressed margination and not failure of infection. In addition,  metabolic acidosis resolved. 2. Lactic acidosis/metabolic acidosis: Anion gap on 7/4 increased to 21. Mild lactic acidosis.  Discussed with critical care and felt that this was likely secondary to bicarbonate losses from continued stool output. Significant improvement after treating with bicarbonate drip  3. Atrial fibrillation? She has history of paroxysmal atrial fibrillation which last occurred during a septic episode. This morning he was tachycardic and looked briefly be in atrial fibrillation. By the time EKG was checked, patient looked to be back in normal sinus rhythm with PACs which is what the EKG showed. Monitor closely.  Remains borderline tachycardic, likely from volume depletion  4. Urinary retention: Resolved. Patient had Foley catheter placed on 6/26 it was kept there until patient's blood pressures were more stable. He was discontinued on 7/1 and patient has continued to have good voiding.  5. Encephalopathy: Resolved. Patient's confusion was felt to be multifactorial, in part medication related-from narcotics as well as infection from C. Difficile and mild uremia from urinary retention. With limiting narcotics, treatment of infection and improvement in renal function, his mentation has since improved.  6. Ileus: Resolved. This is in the setting of a previous partial colon resection for diverticulitis and hemorrhoidal bleeding. Initially felt to be postop, made worse by C. difficile colitis. NG tube was placed. IV Flagyl and rectal vancomycin.  Patient continued to improve and by 6/30, NG tube removed.  Given lactic acidosis seen 7/4, axr done & noted persistent ileus.  Pt himself is asymptomatic of this, so we are continuing him on soft diet  7. Esophagitis: Improving. PPI changed to Pepcid in setting of C. difficile. No further episodes of hematemesis since.  8. Anemia: Stable. Aside from the acute episode of hematemesis,  patient likely has anemia of chronic renal disease.  Some mild  decline, to be expected as his creatinine comes down  9. Acute renal failure in the setting of Stage III chronic kidney disease: Improving. Baseline creatinine reportedly around 1.5.   Patient's creatinine peaked to as high as 5.4 on 6/28.  Given markedly elevated BUN, this is likely consistent with pre-renal azotemia from C. difficile diarrhea volume loss plus urinary retention. With IV fluids and removal of Foley catheter, creatinine continues to improve on a daily basis with most recent creatinine today at 2.0  10. Left renal cystic mass: Incidentally seen during CT of the abdomen scan. Recommendation is following resolution of illness, renal protocol MRI with and without contrast to identify neoplasm versus complex cyst  C. difficile colitis: Patient's third episode overall. PPI held. Has completed approximately 6 days of vancomycin and Flagyl. Flexiseal in place.  Note that his stools are always close to liquid given previous colonic resection.  Given that this is his third recurrence and that is able to take by mouth, will change vancomycin to extend taper. Stool output continues, have encouraged her outside food to improve his appetite and intake    11. CAD: Stable medical issue during this hospitalization.  12. Hypertension: Patient oral antihypertensives have continued to be held secondary to hypovolemia/hypotension  13. Obesity: Patient meets criteria with BMI of 33  14. Deconditioning: Patient has been evaluated by physical and occupational therapy who recommended skilled nursing after discharge  15.  hypokalemia: Secondary to volume losses from diarrhea. Continuing to replace on a daily basis  16. Status post hip arthroplasty: Overall stable. CT of right femur done looking for other source of infection which was negative for that. Was noted to have a small bone fragment from the proximal femur adjacent to the lesser trochanter is favored to represent periprosthetic fracture associated  with recent hip arthroplasty this could also represent avulsion associated with the adductor insertion on the proximal femur.  Will discuss with orthopedic surgery to make sure this is nothing of concern.   Code Status: Full code  Family Communication: Wife at the bedside today  Disposition Plan:   Continues to improve. Transfer to floor. Discharged to [facility] 1 renal function normalized-almost clear, white blood cell count near normal-starting to improve, and stool output manageable    Consultants:  [facility]  Orthopedic surgery  Procedures:  Endoscopy done 6/25: Mild esophagitis noted. Food residue consistent with postop ileus noted. Normal duodenum. No signs of bleeding.  Antibiotics:   Flagyl 6/27, initially IV, changed to oral, stopped on 7/3    vancomycin 6/28 initially rectal, changed to by mouth on 6/30-continue for 6 weeks  IV Rocephin 7/3 x1 dose  HPI/Subjective: Patient continues to feel better. No, pain. No shortness of breath. Continuing stool output. Appetite improving..  Objective: Filed Vitals:   08/15/13 0740  BP: 120/55  Pulse: 96  Temp: 98.6 F (37 C)  Resp: 23    Intake/Output Summary (Last 24 hours) at 08/15/13 0951 Last data filed at 08/15/13 0700  Gross per 24 hour  Intake 2690.83 ml  Output   2300 ml  Net 390.83 ml   Filed Weights   08/13/13 0500 08/14/13 0438 08/15/13 0423  Weight: 89.5 kg (197 lb 5 oz) 92.2 kg (203 lb 4.2 oz) 87.3 kg (192 lb 7.4 oz)    Exam:   General:  Alert and oriented x3, no acute distress  Cardiovascular: Regular rhythm, borderline tachycardia, occasional ectopic beat  Respiratory: Clear to auscultation bilaterally  Abdomen: Soft, mild distention, hypoactive bowel sounds, nontender  Musculoskeletal: No clubbing or cyanosis, trace pitting edema   Data Reviewed: Basic Metabolic Panel:  Recent Labs Lab 08/11/13 0955 08/12/13 0300 08/13/13 0605 08/14/13 0244 08/15/13 0256  NA 138 136*  135* 137 138  K 3.1* 3.0* 3.4* 3.0* 3.0*  CL 98 98 100 102 100  CO2 23 22 19  14* 22  GLUCOSE 186* 169* 166* 171* 158*  BUN 100* 94* 76* 66* 50*  CREATININE 3.17* 2.96* 2.31* 2.23* 2.00*  CALCIUM 7.5* 7.9* 7.8* 7.8* 7.8*  MG  --   --  2.4 2.2 1.9  PHOS  --   --  3.8 4.1 3.4   Liver Function Tests:  Recent Labs Lab 08/13/13 0605 08/14/13 0244 08/15/13 0256  AST 19 15 17   ALT 12 10 9   ALKPHOS 52 52 60  BILITOT 0.6 0.4 0.5  PROT 5.4* 5.3* 5.3*  ALBUMIN 2.3* 2.2* 2.2*   No results found for this basename: LIPASE, AMYLASE,  in the last 168 hours No results found for this basename: AMMONIA,  in the last 168 hours CBC:  Recent Labs Lab 08/11/13 0955 08/12/13 0305 08/13/13 0605 08/13/13 1908 08/14/13 0244 08/15/13 0256  WBC  --  21.1* 24.2* 23.4* 23.9* 20.4*  NEUTROABS 19.4*  --  21.8*  --  21.5* 18.4*  HGB  --  10.6* 10.6* 12.7* 10.1* 9.4*  HCT  --  31.4* 30.9* 37.7* 29.7* 27.3*  MCV  --  88.5 86.8 88.3 87.1 86.1  PLT  --  323 330 397 422* 426*   Cardiac Enzymes:  Recent Labs Lab 08/14/13 0955  TROPONINI <0.30   BNP (last 3 results)  Recent Labs  08/14/13 0955  PROBNP 751.1*   CBG: No results found for this basename: GLUCAP,  in the last 168 hours  Recent Results (from the past 240 hour(s))  CLOSTRIDIUM DIFFICILE BY PCR     Status: Abnormal   Collection Time    08/07/13  2:45 PM      Result Value Ref Range Status   C difficile by pcr POSITIVE (*) NEGATIVE Final   Comment: CRITICAL RESULT CALLED TO, READ BACK BY AND VERIFIED WITH:     Novella Rob RN AT [id number] 08/07/13 BY [place]  URINE CULTURE     Status: None   Collection Time    08/11/13  8:51 AM      Result Value Ref Range Status   Specimen Description URINE, CATHETERIZED   Final   Special Requests NONE   Final   Culture  Setup Time     Final   Value: 08/11/2013 12:55     Performed at [facility]     Final   Value: NO GROWTH     Performed at [facility]    Culture     Final   Value: NO GROWTH     Performed at [facility]   Report Status 08/12/2013 FINAL   Final     Studies: Dg Chest 2 View  08/12/2013    IMPRESSION: No acute abnormality noted. No significant change from the prior study is seen.   Electronically Signed   By: Inez Catalina M.D.   On: 08/12/2013 08:19   Ct Femur Right Wo Contrast  08/12/2013     IMPRESSION: 1. No cause for leukocytosis identified in CT of the RIGHT thigh. 2. Small bone fragment from the proximal femur  adjacent to the lesser trochanter is favored to represent periprosthetic fracture associated with recent hip arthroplasty this could also represent avulsion associated with the adductor insertion on the proximal femur; underlying osteolytic lesion cannot be excluded however is considered unlikely. If the patient has a history of malignancy, whole-body bone scan could be considered in follow-up to assess for other foci of osseous disease.   Electronically Signed   By: Dereck Ligas M.D.   On: 08/12/2013 14:32   Dg Chest Port 1 View  08/13/2013     IMPRESSION: Stable cardiac enlargement and central vascular congestion.  Low lung volumes with vascular crowding and bibasilar atelectasis.   Electronically Signed   By: Kalman Jewels M.D.   On: 08/13/2013 14:44    Scheduled Meds: . famotidine  20 mg Oral Daily  . feeding supplement (ENSURE COMPLETE)  237 mL Oral TID BM  . feeding supplement (RESOURCE BREEZE)  1 Container Oral TID BM  . fluticasone  2 spray Each Nare Daily  . vancomycin  125 mg Oral QID   Followed by  . [START ON 08/17/2013] vancomycin  125 mg Oral BID   Followed by  . [START ON 08/25/2013] vancomycin  125 mg Oral Daily   Followed by  . [START ON 09/01/2013] vancomycin  125 mg Oral QODAY   Followed by  . [START ON 09/09/2013] vancomycin  125 mg Oral Q3 days   Continuous Infusions: . sodium chloride    .  sodium bicarbonate  infusion 1000 mL 125 mL/hr at 08/15/13 0400    Principal Problem:    Severe sepsis Active Problems:   BPH (benign prostatic hyperplasia)   Ileus, postoperative   C. difficile colitis   S/P total hip arthroplasty   Degenerative joint disease (DJD) of hip   Urinary retention   Upper GI bleeding   Acute esophagitis   Renal mass, left   Hypotension, unspecified   Complex renal cyst   Acute blood loss anemia   Encephalopathy   Hypokalemia   Acute renal failure   Chronic kidney disease, stage 3   Anemia in chronic kidney disease   PAF (paroxysmal atrial fibrillation)   Metabolic acidosis    Time spent: 25 minutes    [facility] Pager [phone]. If 7PM-7AM, please contact night-coverage at [email or web address], password [facility] 08/15/2013, 9:51 AM  LOS: 12 days

## 2013-08-16 DIAGNOSIS — N183 Chronic kidney disease, stage 3 unspecified: Secondary | ICD-10-CM

## 2013-08-16 LAB — PATHOLOGIST SMEAR REVIEW

## 2013-08-16 LAB — CBC WITH DIFFERENTIAL/PLATELET
BASOS PCT: 0 % (ref 0–1)
Basophils Absolute: 0 10*3/uL (ref 0.0–0.1)
EOS ABS: 0.2 10*3/uL (ref 0.0–0.7)
Eosinophils Relative: 1 % (ref 0–5)
HEMATOCRIT: 27.6 % — AB (ref 39.0–52.0)
Hemoglobin: 9.3 g/dL — ABNORMAL LOW (ref 13.0–17.0)
Lymphocytes Relative: 4 % — ABNORMAL LOW (ref 12–46)
Lymphs Abs: 0.8 10*3/uL (ref 0.7–4.0)
MCH: 29.2 pg (ref 26.0–34.0)
MCHC: 33.7 g/dL (ref 30.0–36.0)
MCV: 86.8 fL (ref 78.0–100.0)
MONO ABS: 0.9 10*3/uL (ref 0.1–1.0)
MONOS PCT: 5 % (ref 3–12)
Neutro Abs: 17 10*3/uL — ABNORMAL HIGH (ref 1.7–7.7)
Neutrophils Relative %: 90 % — ABNORMAL HIGH (ref 43–77)
Platelets: 399 10*3/uL (ref 150–400)
RBC: 3.18 MIL/uL — ABNORMAL LOW (ref 4.22–5.81)
RDW: 15 % (ref 11.5–15.5)
WBC: 19 10*3/uL — ABNORMAL HIGH (ref 4.0–10.5)

## 2013-08-16 LAB — COMPREHENSIVE METABOLIC PANEL
ALK PHOS: 60 U/L (ref 39–117)
ALT: 10 U/L (ref 0–53)
AST: 28 U/L (ref 0–37)
Albumin: 2.1 g/dL — ABNORMAL LOW (ref 3.5–5.2)
Anion gap: 13 (ref 5–15)
BILIRUBIN TOTAL: 0.6 mg/dL (ref 0.3–1.2)
BUN: 31 mg/dL — ABNORMAL HIGH (ref 6–23)
CO2: 23 meq/L (ref 19–32)
CREATININE: 1.63 mg/dL — AB (ref 0.50–1.35)
Calcium: 7.7 mg/dL — ABNORMAL LOW (ref 8.4–10.5)
Chloride: 100 mEq/L (ref 96–112)
GFR calc Af Amer: 45 mL/min — ABNORMAL LOW (ref >90)
GFR, EST NON AFRICAN AMERICAN: 38 mL/min — AB (ref >90)
Glucose, Bld: 139 mg/dL — ABNORMAL HIGH (ref 70–99)
POTASSIUM: 3.6 meq/L — AB (ref 3.7–5.3)
Sodium: 136 mEq/L — ABNORMAL LOW (ref 137–147)
Total Protein: 5.3 g/dL — ABNORMAL LOW (ref 6.0–8.3)

## 2013-08-16 LAB — MAGNESIUM: MAGNESIUM: 1.7 mg/dL (ref 1.5–2.5)

## 2013-08-16 LAB — PHOSPHORUS: Phosphorus: 3.3 mg/dL (ref 2.3–4.6)

## 2013-08-16 MED ORDER — TRAMADOL HCL 50 MG PO TABS
50.0000 mg | ORAL_TABLET | Freq: Four times a day (QID) | ORAL | Status: DC | PRN
  Administered 2013-08-16 – 2013-08-17 (×6): 50 mg via ORAL
  Filled 2013-08-16 (×6): qty 1

## 2013-08-16 NOTE — Progress Notes (Signed)
[facility] PROGRESS NOTE  Gary [name] [id number] DOB: 1933/01/10 DOA: 08/03/2013 PCP: [name], MD  Interim summary:  80 year old white male past medical history of recurrent C. difficile colitis, stage III CKD and CAD admitted to the orthopedic service on 6/23 for a planned right hip arthroplasty secondary to DJD that day. On 6/25, patient started complaining of increased abdominal tenderness and distention plus episode of coffee-ground emesis. Patient was transferred to step down unit, [facility] and gastroenterology were consulted.  Endoscopy noted signs of a mild ileus-confirmed by CT abdomen, otherwise no signs of bleeding.  Emesis felt to be secondary to esophagitis.  Hospitalists assumed care as primary attending. Patient's ileus was felt to be postop. He was managed conservatively with NG tube placement.  He also developed urinary retention-Foley catheter placed as well as some encephalopathy. In addition, by 6/26, patient's creatinine had started to rise we'll. He started developing diarrhea positive for C. Difficile and treatment was started 6/27 evening.  Over the next 5 days, patient's various medical issues improved overall. He was transferred from step down to the floor on the evening of 7/2.  Pt's WBC  remained persistently elevated & with concerns that his sepsis was not yet resolving, he was transferred back to stepdown on 7/3.    Since transferred to step down, patient has improved. I replacing volume and bicarbonate, patient clinically better. Renal function continues to improve. Transferred out of the unit 7/5.  Assessment/Plan:  1. lSepsis-resolved: Patient net criteria with hypotension, tachycardia and marked  leukocytosis in the context of C. difficile colitis.   However, by treating bicarbonate losses with drip, white blood cell count has noticeably declined as of 7/5. This may be more stressed margination and not failure of infection. In addition,  metabolic acidosis resolved.  2. Lactic acidosis/metabolic acidosis: Anion gap on 7/4 increased to 21. Mild lactic acidosis.  Discussed with critical care and felt that this was likely secondary to bicarbonate losses from continued stool output. Significant improvement after treating with bicarbonate drip  3. Atrial fibrillation? She has history of paroxysmal atrial fibrillation which last occurred during a septic episode. This morning he was tachycardic and looked briefly be in atrial fibrillation. By the time EKG was checked, patient looked to be back in normal sinus rhythm with PACs which is what the EKG showed. Monitor closely.  Remains borderline tachycardic, likely from volume depletion  4. Urinary retention: Resolved. Patient had Foley catheter placed on 6/26 it was kept there until patient's blood pressures were more stable. He was discontinued on 7/1 and patient has continued to have good voiding.  5. Encephalopathy: Resolved. Patient's confusion was felt to be multifactorial, in part medication related-from narcotics as well as infection from C. Difficile and mild uremia from urinary retention. With limiting narcotics, treatment of infection and improvement in renal function, his mentation has since improved.  6. Ileus: Resolved. This is in the setting of a previous partial colon resection for diverticulitis and hemorrhoidal bleeding. Initially felt to be postop, made worse by C. difficile colitis. NG tube was placed. IV Flagyl and rectal vancomycin.  Patient continued to improve and by 6/30, NG tube removed.  Given lactic acidosis seen 7/4, axr done & noted persistent ileus.  Pt himself is asymptomatic of this, so we are continuing him on soft diet  7. Esophagitis: Improving. PPI changed to Pepcid in setting of C. difficile. No further episodes of hematemesis since.  8. Anemia: Stable. Aside from the acute episode of  hematemesis, patient likely has anemia of chronic renal disease.   Hemoglobin has been stable at 9.3 since 7/5.  9. Acute renal failure in the setting of Stage III chronic kidney disease: Improving. Baseline creatinine reportedly around 1.5.   Patient's creatinine peaked to as high as 5.4 on 6/28.  Given markedly elevated BUN, this is likely consistent with pre-renal azotemia from C. difficile diarrhea volume loss plus urinary retention. With IV fluids and removal of Foley catheter, creatinine continues to improve on a daily basis with most recent creatinine today at 1.6 with a BUN of 31, the lowest since admission  10. Left renal cystic mass: Incidentally seen during CT of the abdomen scan. Recommendation is following resolution of illness, renal protocol MRI with and without contrast to identify neoplasm versus complex cyst  11. C. difficile colitis: Patient's third episode overall. PPI held. Has completed approximately 10 days of vancomycin and Flagyl. Flexiseal in place.  Note that his stools are always close to liquid given previous colonic resection.  Given that this is his third recurrence and that is able to take by mouth, will change vancomycin to extend taper. Stool output continues, have encouraged him outside food to improve his appetite and intake    12. CAD: Stable medical issue during this hospitalization.  13. Hypertension: Patient oral antihypertensives have continued to be held secondary to hypovolemia/hypotension  14. Obesity: Patient meets criteria with BMI of 33  15. Deconditioning: Patient has been evaluated by physical and occupational therapy who recommended skilled nursing after discharge  16.  hypokalemia: Secondary to volume losses from diarrhea. Continuing to replace on a daily basis  17. Status post hip arthroplasty: Overall stable. CT of right femur done looking for other source of infection which was negative for that. Was noted to have a small bone fragment from the proximal femur adjacent to the lesser trochanter is favored to  represent periprosthetic fracture associated with recent hip arthroplasty this could also represent avulsion associated with the adductor insertion on the proximal femur.     Code Status: Full code  Family Communication: spoke with daughter and son at the bedside. Spoke with wife by phone  Disposition Plan:   Continues to improve. Renal function at baseline. White blood cell count continues to come down and stool output slowly improving. In addition he should be ready for skilled nursing on 7/8   Consultants:  [facility]  Orthopedic surgery  Procedures:  Endoscopy done 6/25: Mild esophagitis noted. Food residue consistent with postop ileus noted. Normal duodenum. No signs of bleeding.  Antibiotics:   Flagyl 6/27, initially IV, changed to oral, stopped on 7/3    vancomycin 6/28 initially rectal, changed to by mouth on 6/30-continue for 6 weeks  IV Rocephin 7/3 x1 dose  HPI/Subjective: Patient doing okay. Feeling better. Still some stool output, but feeling stronger. Worked well with physical therapy  Objective: Filed Vitals:   08/16/13 1000  BP: 151/76  Pulse: 99  Temp: 97.9 F (36.6 C)  Resp: 18    Intake/Output Summary (Last 24 hours) at 08/16/13 1349 Last data filed at 08/16/13 0900  Gross per 24 hour  Intake   1665 ml  Output    850 ml  Net    815 ml   Filed Weights   08/14/13 0438 08/15/13 0423 08/16/13 0700  Weight: 92.2 kg (203 lb 4.2 oz) 87.3 kg (192 lb 7.4 oz) 90.1 kg (198 lb 10.2 oz)    Exam:   General:  Alert and  oriented x3, no acute distress  Cardiovascular: Regular rhythm, borderline tachycardia, occasional ectopic beat  Respiratory: Clear to auscultation bilaterally  Abdomen: Soft, mild distention, hypoactive bowel sounds, nontender  Musculoskeletal: No clubbing or cyanosis, no pitting edema  Data Reviewed: Basic Metabolic Panel:  Recent Labs Lab 08/12/13 0300 08/13/13 0605 08/14/13 0244 08/15/13 0256 08/16/13 0408   NA 136* 135* 137 138 136*  K 3.0* 3.4* 3.0* 3.0* 3.6*  CL 98 100 102 100 100  CO2 22 19 14* 22 23  GLUCOSE 169* 166* 171* 158* 139*  BUN 94* 76* 66* 50* 31*  CREATININE 2.96* 2.31* 2.23* 2.00* 1.63*  CALCIUM 7.9* 7.8* 7.8* 7.8* 7.7*  MG  --  2.4 2.2 1.9 1.7  PHOS  --  3.8 4.1 3.4 3.3   Liver Function Tests:  Recent Labs Lab 08/13/13 0605 08/14/13 0244 08/15/13 0256 08/16/13 0408  AST 19 15 17 28   ALT 12 10 9 10   ALKPHOS 52 52 60 60  BILITOT 0.6 0.4 0.5 0.6  PROT 5.4* 5.3* 5.3* 5.3*  ALBUMIN 2.3* 2.2* 2.2* 2.1*   No results found for this basename: LIPASE, AMYLASE,  in the last 168 hours No results found for this basename: AMMONIA,  in the last 168 hours CBC:  Recent Labs Lab 08/11/13 0955  08/13/13 0605 08/13/13 1908 08/14/13 0244 08/15/13 0256 08/16/13 0408  WBC  --   < > 24.2* 23.4* 23.9* 20.4* 19.0*  NEUTROABS 19.4*  --  21.8*  --  21.5* 18.4* 17.0*  HGB  --   < > 10.6* 12.7* 10.1* 9.4* 9.3*  HCT  --   < > 30.9* 37.7* 29.7* 27.3* 27.6*  MCV  --   < > 86.8 88.3 87.1 86.1 86.8  PLT  --   < > 330 397 422* 426* 399  < > = values in this interval not displayed. Cardiac Enzymes:  Recent Labs Lab 08/14/13 0955  TROPONINI <0.30   BNP (last 3 results)  Recent Labs  08/14/13 0955  PROBNP 751.1*   CBG: No results found for this basename: GLUCAP,  in the last 168 hours  Recent Results (from the past 240 hour(s))  CLOSTRIDIUM DIFFICILE BY PCR     Status: Abnormal   Collection Time    08/07/13  2:45 PM      Result Value Ref Range Status   C difficile by pcr POSITIVE (*) NEGATIVE Final   Comment: CRITICAL RESULT CALLED TO, READ BACK BY AND VERIFIED WITH:     Novella Rob RN AT [id number] 08/07/13 BY [place]  URINE CULTURE     Status: None   Collection Time    08/11/13  8:51 AM      Result Value Ref Range Status   Specimen Description URINE, CATHETERIZED   Final   Special Requests NONE   Final   Culture  Setup Time     Final   Value: 08/11/2013 12:55      Performed at [place]     Final   Value: NO GROWTH     Performed at [facility]   Culture     Final   Value: NO GROWTH     Performed at [facility]   Report Status 08/12/2013 FINAL   Final  CULTURE, BLOOD (ROUTINE X 2)     Status: None   Collection Time    08/13/13  5:10 PM      Result Value Ref Range Status  Specimen Description BLOOD RIGHT ARM   Final   Special Requests BOTTLES DRAWN AEROBIC ONLY 3CC   Final   Culture  Setup Time     Final   Value: 08/14/2013 02:19     Performed at [facility]   Culture     Final   Value:        BLOOD CULTURE RECEIVED NO GROWTH TO DATE CULTURE WILL BE HELD FOR 5 DAYS BEFORE ISSUING A FINAL NEGATIVE REPORT     Performed at [facility]   Report Status PENDING   Incomplete  CULTURE, BLOOD (ROUTINE X 2)     Status: None   Collection Time    08/13/13  7:08 PM      Result Value Ref Range Status   Specimen Description BLOOD RIGHT HAND   Final   Special Requests BOTTLES DRAWN AEROBIC AND ANAEROBIC 5CC   Final   Culture  Setup Time     Final   Value: 08/14/2013 02:19     Performed at [facility]   Culture     Final   Value:        BLOOD CULTURE RECEIVED NO GROWTH TO DATE CULTURE WILL BE HELD FOR 5 DAYS BEFORE ISSUING A FINAL NEGATIVE REPORT     Performed at [facility]   Report Status PENDING   Incomplete     Studies: Dg Chest 2 View  08/12/2013    IMPRESSION: No acute abnormality noted. No significant change from the prior study is seen.   Electronically Signed   By: Inez Catalina M.D.   On: 08/12/2013 08:19   Ct Femur Right Wo Contrast  08/12/2013     IMPRESSION: 1. No cause for leukocytosis identified in CT of the RIGHT thigh. 2. Small bone fragment from the proximal femur adjacent to the lesser trochanter is favored to represent periprosthetic fracture associated with recent hip arthroplasty this could also represent avulsion associated with the adductor insertion on  the proximal femur; underlying osteolytic lesion cannot be excluded however is considered unlikely. If the patient has a history of malignancy, whole-body bone scan could be considered in follow-up to assess for other foci of osseous disease.   Electronically Signed   By: Dereck Ligas M.D.   On: 08/12/2013 14:32   Dg Chest Port 1 View  08/13/2013     IMPRESSION: Stable cardiac enlargement and central vascular congestion.  Low lung volumes with vascular crowding and bibasilar atelectasis.   Electronically Signed   By: Kalman Jewels M.D.   On: 08/13/2013 14:44    Scheduled Meds: . famotidine  20 mg Oral Daily  . feeding supplement (ENSURE COMPLETE)  237 mL Oral TID BM  . feeding supplement (RESOURCE BREEZE)  1 Container Oral TID BM  . fluticasone  2 spray Each Nare Daily  . vancomycin  125 mg Oral QID   Followed by  . [START ON 08/17/2013] vancomycin  125 mg Oral BID   Followed by  . [START ON 08/25/2013] vancomycin  125 mg Oral Daily   Followed by  . [START ON 09/01/2013] vancomycin  125 mg Oral QODAY   Followed by  . [START ON 09/09/2013] vancomycin  125 mg Oral Q3 days   Continuous Infusions: . sodium chloride 50 mL/hr at 08/16/13 1313    Principal Problem:   Severe sepsis Active Problems:   BPH (benign prostatic hyperplasia)   Ileus, postoperative   C. difficile colitis   S/P total hip arthroplasty  Degenerative joint disease (DJD) of hip   Urinary retention   Upper GI bleeding   Acute esophagitis   Renal mass, left   Hypotension, unspecified   Complex renal cyst   Acute blood loss anemia   Encephalopathy   Hypokalemia   Acute renal failure   Chronic kidney disease, stage 3   Anemia in chronic kidney disease   PAF (paroxysmal atrial fibrillation)   Metabolic acidosis    Time spent: 25 minutes    [facility] Pager [phone]. If 7PM-7AM, please contact night-coverage at [email or web address], password [facility] 08/16/2013, 1:49 PM  LOS: 13 days

## 2013-08-16 NOTE — Progress Notes (Signed)
Pt transferred to [place]. This CSW provided handoff to [place] covering [place]. This CSW signing off.   Ky Barban, MSW, [facility] Clinical Social Worker [phone]

## 2013-08-16 NOTE — Progress Notes (Signed)
Physical Therapy Treatment Patient Details Name: Gary [name] MRN: [id number] DOB: Jun 28, 1932 Today's Date: 08/16/2013    History of Present Illness 80 yo male s/p Rt THA ( direct anterior). Pt developed post-op ileus  and underwent EDG 08/05/13. PMHx- arthritis, anxiety, CAD (s/p stents), back surgery x 3, colectomy. 08/05/13 EGD + gastritis and CT suspicious for ilieus. C. Diff +    PT Comments    Pt agreeable to therex at this time, and abl eto perform most of exercises without assist; Encouraged pt to perform therex independently throughout the day; Willplan to return for mobility, transfer training, and hopefully amb later this morning   Follow Up Recommendations  SNF;Supervision/Assistance - 24 hour (pt wants to go home)     Equipment Recommendations  None recommended by PT    Recommendations for Other Services       Precautions / Restrictions Precautions Precautions: Fall Precaution Comments: Direct anterior THA with no precautions; TED hose Restrictions RLE Weight Bearing: Weight bearing as tolerated    Mobility  Bed Mobility                  Transfers                    Ambulation/Gait                 Stairs            Wheelchair Mobility    Modified Rankin (Stroke Patients Only)       Balance                                    Cognition Arousal/Alertness: Awake/alert Behavior During Therapy: WFL for tasks assessed/performed Overall Cognitive Status: Within Functional Limits for tasks assessed                      Exercises Total Joint Exercises Ankle Circles/Pumps: AROM;Both;10 reps;Supine Quad Sets: AROM;Both;10 reps;Supine Gluteal Sets: AROM;Both;10 reps;Supine Towel Squeeze: AROM;Both;10 reps;Supine Heel Slides: AROM;Both;10 reps;Supine Hip ABduction/ADduction: AAROM;Both;10 reps;Supine (with manual tracking resistence)    General Comments        Pertinent Vitals/Pain Reports minimal  pain; wanting to have pain meds before getting up     Home Living                      Prior Function            PT Goals (current goals can now be found in the care plan section) Acute Rehab PT Goals Patient Stated Goal: States he wants to go home Time For Goal Achievement:  (plan to reassess goals next session) Progress towards PT goals: Progressing toward goals    Frequency  Min 5X/week    PT Plan Current plan remains appropriate    Co-evaluation             End of Session   Activity Tolerance: Patient tolerated treatment well Patient left: in bed;with call bell/phone within reach     Time: 0906-0916 PT Time Calculation (min): 10 min  Charges:  $Therapeutic Exercise: 8-22 mins                    G Codes:      Quin Hoop 08/16/2013, 9:21 AM  [name], [place] Pager [phone] Office [phone]

## 2013-08-16 NOTE — Progress Notes (Signed)
Subjective: 11 Days Post-Op Procedure(s) (LRB): ESOPHAGOGASTRODUODENOSCOPY (EGD) (N/A)  Status post right anterior THR  Activity level:  wbat Diet tolerance:  Progressing from clears Voiding:  rectal tube and foley in place Patient reports pain as mild.    Objective: Vital signs in last 24 hours: Temp:  [97.9 F (36.6 C)-98.1 F (36.7 C)] 97.9 F (36.6 C) (07/06 1000) Pulse Rate:  [97-103] 99 (07/06 1000) Resp:  [18-20] 18 (07/06 1000) BP: (132-151)/(61-80) 151/76 mmHg (07/06 1000) SpO2:  [95 %-99 %] 96 % (07/06 1000) Weight:  [90.1 kg (198 lb 10.2 oz)] 90.1 kg (198 lb 10.2 oz) (07/06 0700)  Labs:  Recent Labs  08/13/13 1908 08/14/13 0244 08/15/13 0256 08/16/13 0408  HGB 12.7* 10.1* 9.4* 9.3*    Recent Labs  08/15/13 0256 08/16/13 0408  WBC 20.4* 19.0*  RBC 3.17* 3.18*  HCT 27.3* 27.6*  PLT 426* 399    Recent Labs  08/15/13 0256 08/16/13 0408  NA 138 136*  K 3.0* 3.6*  CL 100 100  CO2 22 23  BUN 50* 31*  CREATININE 2.00* 1.63*  GLUCOSE 158* 139*  CALCIUM 7.8* 7.7*   No results found for this basename: LABPT, INR,  in the last 72 hours  Physical Exam:  Neurologically intact Neurovascular intact Sensation intact distally Intact pulses distally Dorsiflexion/Plantar flexion intact Incision: dressing C/D/I No cellulitis present Compartment soft  Assessment/Plan:  11 Days Post-Op Procedure(s) (LRB): ESOPHAGOGASTRODUODENOSCOPY (EGD) (N/A) Up with therapy He is cleared from an orthopaedic standpoint to be discharged to SNF. It appears like medicine believes that he will be able to go on 08/18/13. Currently being treated for C-diff.  SCD's for DVT prophylaxis with no anticoagulation due to GI issues  Continue current pain regimen.  Medicine managing sepsis, C-Diff with abx, Post-op ilius imporoving. ARF, pre-renal, currently receiving fluids. Hypotension, currently receiving fluids.  Appreciate medical input.   Rusty Villella, Larwance Sachs 08/16/2013, 3:07  PM

## 2013-08-17 DIAGNOSIS — I10 Essential (primary) hypertension: Secondary | ICD-10-CM

## 2013-08-17 DIAGNOSIS — I4891 Unspecified atrial fibrillation: Secondary | ICD-10-CM

## 2013-08-17 LAB — CBC WITH DIFFERENTIAL/PLATELET
BASOS ABS: 0 10*3/uL (ref 0.0–0.1)
Basophils Relative: 0 % (ref 0–1)
EOS PCT: 1 % (ref 0–5)
Eosinophils Absolute: 0.2 10*3/uL (ref 0.0–0.7)
HCT: 27.3 % — ABNORMAL LOW (ref 39.0–52.0)
Hemoglobin: 9.3 g/dL — ABNORMAL LOW (ref 13.0–17.0)
LYMPHS ABS: 0.6 10*3/uL — AB (ref 0.7–4.0)
Lymphocytes Relative: 4 % — ABNORMAL LOW (ref 12–46)
MCH: 30 pg (ref 26.0–34.0)
MCHC: 34.1 g/dL (ref 30.0–36.0)
MCV: 88.1 fL (ref 78.0–100.0)
Monocytes Absolute: 1 10*3/uL (ref 0.1–1.0)
Monocytes Relative: 6 % (ref 3–12)
NEUTROS PCT: 89 % — AB (ref 43–77)
Neutro Abs: 14.2 10*3/uL — ABNORMAL HIGH (ref 1.7–7.7)
PLATELETS: 440 10*3/uL — AB (ref 150–400)
RBC: 3.1 MIL/uL — AB (ref 4.22–5.81)
RDW: 15.3 % (ref 11.5–15.5)
Smear Review: INCREASED
WBC: 16 10*3/uL — ABNORMAL HIGH (ref 4.0–10.5)

## 2013-08-17 LAB — COMPREHENSIVE METABOLIC PANEL
ALT: 14 U/L (ref 0–53)
ANION GAP: 15 (ref 5–15)
AST: 22 U/L (ref 0–37)
Albumin: 2.2 g/dL — ABNORMAL LOW (ref 3.5–5.2)
Alkaline Phosphatase: 70 U/L (ref 39–117)
BUN: 24 mg/dL — ABNORMAL HIGH (ref 6–23)
CO2: 22 meq/L (ref 19–32)
Calcium: 7.9 mg/dL — ABNORMAL LOW (ref 8.4–10.5)
Chloride: 101 mEq/L (ref 96–112)
Creatinine, Ser: 1.63 mg/dL — ABNORMAL HIGH (ref 0.50–1.35)
GFR, EST AFRICAN AMERICAN: 45 mL/min — AB (ref >90)
GFR, EST NON AFRICAN AMERICAN: 38 mL/min — AB (ref >90)
GLUCOSE: 137 mg/dL — AB (ref 70–99)
Potassium: 3 mEq/L — ABNORMAL LOW (ref 3.7–5.3)
Sodium: 138 mEq/L (ref 137–147)
Total Bilirubin: 0.5 mg/dL (ref 0.3–1.2)
Total Protein: 5.6 g/dL — ABNORMAL LOW (ref 6.0–8.3)

## 2013-08-17 LAB — PHOSPHORUS: Phosphorus: 3.6 mg/dL (ref 2.3–4.6)

## 2013-08-17 LAB — MAGNESIUM: Magnesium: 1.6 mg/dL (ref 1.5–2.5)

## 2013-08-17 MED ORDER — ONDANSETRON HCL 4 MG PO TABS: 4.0000 mg | ORAL_TABLET | Freq: Four times a day (QID) | ORAL | Status: DC | PRN

## 2013-08-17 MED ORDER — SACCHAROMYCES BOULARDII 250 MG PO CAPS
250.0000 mg | ORAL_CAPSULE | Freq: Two times a day (BID) | ORAL | Status: DC
  Administered 2013-08-17 – 2013-08-18 (×3): 250 mg via ORAL
  Filled 2013-08-17 (×6): qty 1

## 2013-08-17 MED ORDER — SACCHAROMYCES BOULARDII 250 MG PO CAPS: 250.0000 mg | ORAL_CAPSULE | Freq: Two times a day (BID) | ORAL | Status: DC

## 2013-08-17 MED ORDER — BISOPROLOL FUMARATE 5 MG PO TABS
2.5000 mg | ORAL_TABLET | Freq: Every day | ORAL | Status: DC
  Administered 2013-08-17 – 2013-08-18 (×2): 2.5 mg via ORAL
  Filled 2013-08-17 (×3): qty 0.5

## 2013-08-17 MED ORDER — CITALOPRAM HYDROBROMIDE 40 MG PO TABS
40.0000 mg | ORAL_TABLET | Freq: Every day | ORAL | Status: DC
  Administered 2013-08-17 – 2013-08-18 (×2): 40 mg via ORAL
  Filled 2013-08-17 (×3): qty 1

## 2013-08-17 MED ORDER — GABAPENTIN 600 MG PO TABS: 300.0000 mg | ORAL_TABLET | Freq: Three times a day (TID) | ORAL | Status: DC

## 2013-08-17 MED ORDER — HYDROCODONE-ACETAMINOPHEN 5-325 MG PO TABS: 1.0000 | ORAL_TABLET | ORAL | Status: DC | PRN

## 2013-08-17 MED ORDER — VANCOMYCIN 50 MG/ML ORAL SOLUTION: ORAL | Status: DC

## 2013-08-17 MED ORDER — POTASSIUM CHLORIDE CRYS ER 20 MEQ PO TBCR
40.0000 meq | EXTENDED_RELEASE_TABLET | Freq: Once | ORAL | Status: AC
  Administered 2013-08-17: 40 meq via ORAL
  Filled 2013-08-17: qty 2

## 2013-08-17 MED ORDER — DOXAZOSIN MESYLATE 4 MG PO TABS
4.0000 mg | ORAL_TABLET | Freq: Every day | ORAL | Status: DC
  Administered 2013-08-17: 4 mg via ORAL
  Filled 2013-08-17 (×3): qty 1

## 2013-08-17 MED ORDER — TAMSULOSIN HCL 0.4 MG PO CAPS
0.4000 mg | ORAL_CAPSULE | Freq: Every day | ORAL | Status: DC
  Administered 2013-08-17: 0.4 mg via ORAL
  Filled 2013-08-17 (×2): qty 1

## 2013-08-17 MED ORDER — GABAPENTIN 300 MG PO CAPS
300.0000 mg | ORAL_CAPSULE | Freq: Three times a day (TID) | ORAL | Status: DC
  Administered 2013-08-17 – 2013-08-18 (×3): 300 mg via ORAL
  Filled 2013-08-17 (×7): qty 1

## 2013-08-17 NOTE — Discharge Summary (Signed)
Physician Discharge Summary  Gary [name] [id number] DOB: [birth year] DOA: 08/03/2013  PCP: Gary Peaches, MD  Admit date: 08/03/2013 Anticipated Discharge date: 08/18/2013  Time spent: 35 minutes  Recommendations for Outpatient Follow-up:  1. New Medication: Oral vancomycin taper for the next months 2. Pt being discharged to short term SNF at [facility] 3. Medication adjustment: Pt will stop her lasix for 1 month 4. Followup: pt will need a renal protocol MRI with and without contrast to identify neoplasm versus complex cyst in a month (after   Discharge Diagnoses:  Principal Problem:   Severe sepsis Active Problems:   BPH (benign prostatic hyperplasia)   Ileus, postoperative   C. difficile colitis   S/P total hip arthroplasty   Degenerative joint disease (DJD) of hip   Urinary retention   Upper GI bleeding   Acute esophagitis   Renal mass, left   Hypotension, unspecified   Complex renal cyst   Acute blood loss anemia   Encephalopathy   Hypokalemia   Acute renal failure   Chronic kidney disease, stage 3   Anemia in chronic kidney disease   PAF (paroxysmal atrial fibrillation)   Metabolic acidosis   Discharge Condition: Improved, being discharged to SNF  Diet recommendation: Heart healthy  Filed Weights   08/15/13 0423 08/16/13 0700 08/16/13 2034  Weight: 87.3 kg (192 lb 7.4 oz) 90.1 kg (198 lb 10.2 oz) 87.091 kg (192 lb)    History of present illness:  80 year old white male past medical history of recurrent C. difficile colitis, stage III CKD and CAD admitted to the orthopedic service on 6/23 for a planned right hip arthroplasty secondary to DJD that day. On 6/25, patient started complaining of increased abdominal tenderness and distention plus episode of coffee-ground emesis. Patient was transferred to step down unit, [facility] and gastroenterology were consulted.   Hospital Course:  Hospitalists assumed care as primary attending. Patient's ileus  was felt to be postop. He was managed conservatively with NG tube placement. He also developed urinary retention-Foley catheter placed as well as some encephalopathy. In addition, by 6/26, patient's creatinine had started to rise we'll. He started developing diarrhea positive for C. Difficile and treatment was started 6/27 evening. Over the next 5 days, patient's various medical issues improved overall. He was transferred from step down to the floor on the evening of 7/2. Pt's WBC remained persistently elevated & with concerns that his sepsis was not yet resolving and was transferred back to stepdown on 7/3.  Since transferred to step down, patient improved, replacing volume and bicarbonate. Clinically better as well. Transferred out of the unit 7/5.  1. Sepsis-resolved: Patient net criteria with hypotension, tachycardia and marked leukocytosis in the context of C. difficile colitis. However, by treating bicarbonate losses with drip, white blood cell count has noticeably declined as of 7/5. This may be more stressed margination and not failure of infection. In addition, metabolic acidosis resolved.  2. Lactic acidosis/metabolic acidosis: Anion gap on 7/4 increased to 21. Mild lactic acidosis. Discussed with critical care and felt that this was likely secondary to bicarbonate losses from continued stool output. Significant improvement after treating with bicarbonate drip  3. Atrial fibrillation? He has history of paroxysmal atrial fibrillation which last occurred during a septic episode. On 7/4 he was tachycardic and looked briefly be in atrial fibrillation. By the time EKG was checked, patient looked to be back in normal sinus rhythm with PACs which is what the EKG showed. Monitor closely. Remains borderline tachycardic, likely  from volume depletion.  BBlocker   4. Urinary retention: Resolved. Patient had Foley catheter placed on 6/26 it was kept there until patient's blood pressures were more stable. He  was discontinued on 7/1 and patient has continued to have good voiding.  Prostate medications restarted.  5. Encephalopathy: Resolved. Patient's confusion was felt to be multifactorial, in part medication related-from narcotics as well as infection from C. Difficile and mild uremia from urinary retention. With limiting narcotics, treatment of infection and improvement in renal function, his mentation has since improved.  6. Ileus: Resolved. This is in the setting of a previous partial colon resection for diverticulitis and hemorrhoidal bleeding. Initially felt to be postop, made worse by C. difficile colitis. NG tube was placed. IV Flagyl and rectal vancomycin. Patient continued to improve and by 6/30, NG tube removed. Given lactic acidosis seen 7/4, axr done & noted persistent ileus. Pt himself is asymptomatic of this, so we are continuing him on soft diet  7. Esophagitis: Improving. PPI changed to Pepcid in setting of C. difficile. No further episodes of hematemesis since.  8. Anemia: Stable. Aside from the acute episode of hematemesis, patient likely has anemia of chronic renal disease. Hemoglobin has been stable at 9.3 since 7/5.  9. Acute renal failure in the setting of Stage III chronic kidney disease: Patient's creatinine peaked to as high as 5.4 on 6/28. Given markedly elevated BUN, this is likely consistent with pre-renal azotemia from C. difficile diarrhea volume loss plus urinary retention. With IV fluids and removal of Foley catheter, creatinine continues to improve on a daily basis, back to baseline at 1.677/6.  10. Left renal cystic mass: Incidentally seen during CT of the abdomen scan. Recommendation is following resolution of illness, renal protocol MRI with and without contrast to identify neoplasm versus complex cyst  11. C. difficile colitis: Patient's third episode overall. PPI held. Has completed approximately 10 days of vancomycin and Flagyl of 6 week course. Initially had a  Flexiseal in place. Note that his stools are always close to liquid given previous colonic resection. Stool output continues, have encouraged him outside food to improve his appetite and intake   12. CAD: Stable medical issue during this hospitalization.  13. Hypertension: Patient oral antihypertensives initially held secondary to hypovolemia/hypotension.  Restarted on 7/7 in anticipation for DC on 7/8  14. Obesity: Patient meets criteria with BMI of 33  15. Deconditioning: Patient has been evaluated by physical and occupational therapy who recommended skilled nursing after discharge  16. hypokalemia: Secondary to volume losses from diarrhea. Continuing to replace on a daily basis  17. Status post hip arthroplasty: Overall stable. CT of right femur done looking for other source of infection which was negative for that. Was noted to have a small bone fragment from the proximal femur adjacent to the lesser trochanter is favored to represent periprosthetic fracture associated with recent hip arthroplasty.  Outpt followup with Ortho in 2 weeks  Procedures: Endoscopy done 6/25: Mild esophagitis noted. Food residue consistent with postop ileus noted. Normal duodenum. No signs of bleeding.   Consultations:  [facility]  Orthopedic surgery   Discharge Exam: Filed Vitals:   08/17/13 1000  BP: 136/77  Pulse: 101  Temp: 98.4 F (36.9 C)  Resp: 20    General: Alert and oriented x3, no acute distress Cardiovascular: Regular rate and rhythm, S1-S2, borderline tachycardia Respiratory: Clear to auscultation bilaterally  Discharge Instructions You were cared for by a hospitalist during your hospital stay. If you have  any questions about your discharge medications or the care you received while you were in the hospital after you are discharged, you can call the unit and asked to speak with the hospitalist on call if the hospitalist that took care of you is not available. Once you  are discharged, your primary care physician will handle any further medical issues. Please note that NO REFILLS for any discharge medications will be authorized once you are discharged, as it is imperative that you return to your primary care physician (or establish a relationship with a primary care physician if you do not have one) for your aftercare needs so that they can reassess your need for medications and monitor your lab values.     Medication List    STOP taking these medications       furosemide 20 MG tablet  Commonly known as:  LASIX      TAKE these medications       aspirin EC 81 MG tablet  Take 81 mg by mouth daily.     bisoprolol 5 MG tablet  Commonly known as:  ZEBETA  Take 0.5 tablets (2.5 mg total) by mouth daily.     citalopram 40 MG tablet  Commonly known as:  CELEXA  Take 1 tablet (40 mg total) by mouth daily.     doxazosin 8 MG tablet  Commonly known as:  CARDURA  Take 8 mg by mouth at bedtime.     fexofenadine 180 MG tablet  Commonly known as:  ALLEGRA  Take 180 mg by mouth daily.     FLONASE 50 MCG/ACT nasal spray  Generic drug:  fluticasone  Place 2 sprays into the nose daily.     gabapentin 400 MG capsule  Commonly known as:  NEURONTIN  Take 400 mg by mouth 3 (three) times daily.     HYDROcodone-acetaminophen 5-325 MG per tablet  Commonly known as:  NORCO/VICODIN  Take 1 tablet by mouth every 4 (four) hours as needed. For pain     LORazepam 0.5 MG tablet  Commonly known as:  ATIVAN  Take 0.25 mg by mouth at bedtime.     nitroGLYCERIN 0.4 MG SL tablet  Commonly known as:  NITROSTAT  Place 0.4 mg under the tongue every 5 (five) minutes as needed for chest pain. x3 doses as needed for chest pain     ondansetron 4 MG tablet  Commonly known as:  ZOFRAN  Take 1 tablet (4 mg total) by mouth every 6 (six) hours as needed for nausea.     saccharomyces boulardii 250 MG capsule  Commonly known as:  FLORASTOR  Take 1 capsule (250 mg total) by  mouth 2 (two) times daily.     simvastatin 40 MG tablet  Commonly known as:  ZOCOR  Take 40 mg by mouth daily.     tamsulosin 0.4 MG Caps capsule  Commonly known as:  FLOMAX  Take 0.4 mg by mouth daily after supper.     testosterone 50 MG/5GM (1%) Gel  Commonly known as:  ANDROGEL  Place 5 g onto the skin daily. Applied to shoulder     traMADol-acetaminophen 37.5-325 MG per tablet  Commonly known as:  ULTRACET  Take 2 tablets by mouth 3 (three) times daily as needed for moderate pain.     vancomycin 50 mg/mL oral solution  Commonly known as:  VANCOCIN  125 mg po bid x 7 days, then daily x 7 days, then every other day x 7 days, then  every 3 days x 14 days       Allergies  Allergen Reactions  . Naproxen Rash  . Clopidogrel Bisulfate Other (See Comments)    H/O diverticulitis; prone to rectal bleeding.  Plavix complicated this.  jkl  . Sulfonamide Derivatives Hives  . Other Other (See Comments)    Narcotics-C. Diff  . Plavix [Clopidogrel Bisulfate]   . Latex Rash  . Tape Itching and Rash    Latex-Adhesive tape       Follow-up Information   Follow up with Hessie Dibble, MD In 2 weeks. (For suture removal, For wound re-check)    Specialty:  Orthopedic Surgery   Contact information:   [address] [place] [address] [phone]       Follow up with GREEN, Keenan Bachelor, MD In 1 month.   Specialty:  Internal Medicine   Contact information:   [address] [place] [address] [phone]        The results of significant diagnostics from this hospitalization (including imaging, microbiology, ancillary and laboratory) are listed below for reference.    Significant Diagnostic Studies: Dg Chest 2 View  08/12/2013   CLINICAL DATA:  Leukocytosis  EXAM: CHEST  2 VIEW  COMPARISON:  08/07/2013  FINDINGS: Cardiac shadow is stable. The lungs are well aerated. Elevation of the right hemidiaphragm is seen. The bony structures are within normal limits.   IMPRESSION: No acute abnormality noted. No significant change from the prior study is seen.   Electronically Signed   By: Inez Catalina M.D.   On: 08/12/2013 08:19   Dg Hip Operative Right  08/03/2013   CLINICAL DATA:  Right total hip arthroplasty  EXAM: DG OPERATIVE RIGHT HIP  TECHNIQUE: A single spot fluoroscopic AP image of the right hip is submitted.  COMPARISON:  None.  FINDINGS: C-arm spot film shows the femoral and acetabular components of the right hip replacement to be in good position. No complicating features are seen.  IMPRESSION: Right total hip replacement.   Electronically Signed   By: Ivar Drape M.D.   On: 08/03/2013 13:17   Dg Abd 1 View  08/07/2013    IMPRESSION: Similar small bowel dilatation. Adynamic ileus versus partial small bowel obstruction. No free intraperitoneal air or other acute complication, given limitation of portable supine radiograph.   Electronically Signed   By: Abigail Miyamoto M.D.   On: 08/07/2013 13:33   Ct Abdomen Pelvis W Contrast  08/05/2013    IMPRESSION: 1. Subtotal colectomy. All of the small bowel and the small residual amount of colon appears dilated, favoring ileus. 2. There is an abnormal complex 2.2 cm lesion in the left mid kidney. In the non acute setting, renal protocol MRI with and without contrast is recommended to further characterize this lesion, which could be neoplastic or could be a complex cyst. 3. Trace bilateral pleural effusions with mild bibasilar atelectasis. 4. Atherosclerosis.   Electronically Signed   By: Sherryl Barters M.D.   On: 08/05/2013 23:33   Ct Femur Right Wo Contrast  08/12/2013     IMPRESSION: 1. No cause for leukocytosis identified in CT of the RIGHT thigh. 2. Small bone fragment from the proximal femur adjacent to the lesser trochanter is favored to represent periprosthetic fracture associated with recent hip arthroplasty this could also represent avulsion associated with the adductor insertion on the proximal femur;  underlying osteolytic lesion cannot be excluded however is considered unlikely. If the patient has a history of malignancy,  whole-body bone scan could be considered in follow-up to assess for other foci of osseous disease.   Electronically Signed   By: Dereck Ligas M.D.   On: 08/12/2013 14:32   Dg Chest Port 1 View  08/13/2013    IMPRESSION: Stable cardiac enlargement and central vascular congestion.  Low lung volumes with vascular crowding and bibasilar atelectasis.   Electronically Signed   By: Kalman Jewels M.D.   On: 08/13/2013 14:44   Dg Chest Port 1 View  08/07/2013    IMPRESSION: No evidence of pneumonia.  Cardiomegaly with low lung volumes and bibasilar volume loss.   Electronically Signed   By: Abigail Miyamoto M.D.   On: 08/07/2013 13:45   Dg Abd Portable 1v  08/14/2013     IMPRESSION: Stable minimal small bowel ileus or partial obstruction.   Electronically Signed   By: Enrique Sack M.D.   On: 08/14/2013 10:27   Dg Abd Portable 1v  08/09/2013     IMPRESSION: Improving small bowel dilatation.  No acute abnormality is noted.   Electronically Signed   By: Inez Catalina M.D.   On: 08/09/2013 07:42   Dg Abd Portable 1v  08/06/2013     IMPRESSION: Persistent prominent distention small large bowel most consistent adynamic ileus. Change followup abdominal series suggested to demonstrate resolution and to exclude bowel obstruction.   Electronically Signed   By: Marcello Moores  Register   On: 08/06/2013 08:48    Microbiology: Recent Results (from the past 240 hour(s))  CLOSTRIDIUM DIFFICILE BY PCR     Status: Abnormal   Collection Time    08/07/13  2:45 PM      Result Value Ref Range Status   C difficile by pcr POSITIVE (*) NEGATIVE Final   Comment: CRITICAL RESULT CALLED TO, READ BACK BY AND VERIFIED WITH:     Novella Rob RN AT [id number] 08/07/13 BY [place]  URINE CULTURE     Status: None   Collection Time    08/11/13  8:51 AM      Result Value Ref Range Status   Specimen Description URINE,  CATHETERIZED   Final   Special Requests NONE   Final   Culture  Setup Time     Final   Value: 08/11/2013 12:55     Performed at [facility]     Final   Value: NO GROWTH     Performed at [facility]   Culture     Final   Value: NO GROWTH     Performed at [facility]   Report Status 08/12/2013 FINAL   Final  CULTURE, BLOOD (ROUTINE X 2)     Status: None   Collection Time    08/13/13  5:10 PM      Result Value Ref Range Status   Specimen Description BLOOD RIGHT ARM   Final   Special Requests BOTTLES DRAWN AEROBIC ONLY 3CC   Final   Culture  Setup Time     Final   Value: 08/14/2013 02:19     Performed at [facility]   Culture     Final   Value:        BLOOD CULTURE RECEIVED NO GROWTH TO DATE CULTURE WILL BE HELD FOR 5 DAYS BEFORE ISSUING A FINAL NEGATIVE REPORT     Performed at [facility]   Report Status PENDING   Incomplete  CULTURE, BLOOD (ROUTINE X 2)     Status: None  Collection Time    08/13/13  7:08 PM      Result Value Ref Range Status   Specimen Description BLOOD RIGHT HAND   Final   Special Requests BOTTLES DRAWN AEROBIC AND ANAEROBIC 5CC   Final   Culture  Setup Time     Final   Value: 08/14/2013 02:19     Performed at [facility]   Culture     Final   Value:        BLOOD CULTURE RECEIVED NO GROWTH TO DATE CULTURE WILL BE HELD FOR 5 DAYS BEFORE ISSUING A FINAL NEGATIVE REPORT     Performed at [facility]   Report Status PENDING   Incomplete     Labs: Basic Metabolic Panel:  Recent Labs Lab 08/13/13 0605 08/14/13 0244 08/15/13 0256 08/16/13 0408 08/17/13 0710  NA 135* 137 138 136* 138  K 3.4* 3.0* 3.0* 3.6* 3.0*  CL 100 102 100 100 101  CO2 19 14* 22 23 22   GLUCOSE 166* 171* 158* 139* 137*  BUN 76* 66* 50* 31* 24*  CREATININE 2.31* 2.23* 2.00* 1.63* 1.63*  CALCIUM 7.8* 7.8* 7.8* 7.7* 7.9*  MG 2.4 2.2 1.9 1.7 1.6  PHOS 3.8 4.1 3.4 3.3 3.6   Liver Function Tests:  Recent  Labs Lab 08/13/13 0605 08/14/13 0244 08/15/13 0256 08/16/13 0408 08/17/13 0710  AST 19 15 17 28 22   ALT 12 10 9 10 14   ALKPHOS 52 52 60 60 70  BILITOT 0.6 0.4 0.5 0.6 0.5  PROT 5.4* 5.3* 5.3* 5.3* 5.6*  ALBUMIN 2.3* 2.2* 2.2* 2.1* 2.2*   No results found for this basename: LIPASE, AMYLASE,  in the last 168 hours No results found for this basename: AMMONIA,  in the last 168 hours CBC:  Recent Labs Lab 08/13/13 0605 08/13/13 1908 08/14/13 0244 08/15/13 0256 08/16/13 0408 08/17/13 0710  WBC 24.2* 23.4* 23.9* 20.4* 19.0* 16.0*  NEUTROABS 21.8*  --  21.5* 18.4* 17.0* 14.2*  HGB 10.6* 12.7* 10.1* 9.4* 9.3* 9.3*  HCT 30.9* 37.7* 29.7* 27.3* 27.6* 27.3*  MCV 86.8 88.3 87.1 86.1 86.8 88.1  PLT 330 397 422* 426* 399 440*   Cardiac Enzymes:  Recent Labs Lab 08/14/13 0955  TROPONINI <0.30   BNP: BNP (last 3 results)  Recent Labs  08/14/13 0955  PROBNP 751.1*    Signed:  Gevena Barre K  [facility] 08/17/2013, 2:08 PM

## 2013-08-17 NOTE — Progress Notes (Signed)
Physical Therapy Treatment Patient Details Name: Gary [name] MRN: [id number] DOB: [birth year] Today's Date: 08/17/2013    History of Present Illness 80 yo male s/p Rt THA ( direct anterior). Pt developed post-op ileus  and underwent EDG 08/05/13. PMHx- arthritis, anxiety, CAD (s/p stents), back surgery x 3, colectomy. 08/05/13 EGD + gastritis and CT suspicious for ilieus. C. Diff +    PT Comments    Making good progress with mobility and progressive amb; Continue to recommend SNF for  rehab for post-acute therapy needs, as he would receive PT daily at SNF, and better capitalize on momentum of progress; at the end of my session, pt agreeable to [facility] for SNF  Since then, discussed pt case with RN and reviewed Case Mgr note; I sounds like family is choosing home despite our best recommendations; update OT as well  For home, he will need reliable 24 hour assist (given his heavy personal care needs), HHPT/OT/Aide/RN/SW  Hospital bed  Wheelchair  RW  Ambulance or medical Lucianne Lei transport home   Follow Up Recommendations  SNF;Supervision/Assistance - 24 hour     Equipment Recommendations  Other (comment) (See above)    Recommendations for Other Services OT consult     Precautions / Restrictions Precautions Precautions: Fall Precaution Comments: Direct anterior THA with no precautions; TED hose Restrictions Weight Bearing Restrictions: No RLE Weight Bearing: Weight bearing as tolerated    Mobility  Bed Mobility Overal bed mobility: Needs Assistance Bed Mobility: Supine to Sit     Supine to sit: Min assist     General bed mobility comments: Less assist needed to day; Cues to move body closer to EOB in prep for getting up; Used bedrails to push up, and min assist to acheive full standing  Transfers Overall transfer level: Needs assistance Equipment used: Rolling walker (2 wheeled) Transfers: Sit to/from Stand Sit to Stand: Min assist;+2 safety/equipment          General transfer comment: Verbal cues for hand placement and technique.  Assist to rise to standing and for balance  Ambulation/Gait Ambulation/Gait assistance: Min guard;+2 safety/equipment Ambulation Distance (Feet): 40 Feet Assistive device: Rolling walker (2 wheeled) Gait Pattern/deviations: Decreased step length - right;Decreased step length - left;Trunk flexed Gait velocity: very decreased   General Gait Details: Cues for gait sequence and posture. Cues also to self-monitor for activity tolerance; Assist for RW   Stairs            Wheelchair Mobility    Modified Rankin (Stroke Patients Only)       Balance     Sitting balance-Leahy Scale: Fair       Standing balance-Leahy Scale: Poor                      Cognition Arousal/Alertness: Awake/alert Behavior During Therapy: WFL for tasks assessed/performed Overall Cognitive Status: Within Functional Limits for tasks assessed                      Exercises Total Joint Exercises Ankle Circles/Pumps: AROM;Both;10 reps;Supine Quad Sets: AROM;Both;10 reps;Supine Gluteal Sets: AROM;Both;10 reps;Supine Towel Squeeze: AROM;Both;10 reps;Supine Heel Slides: AROM;Both;10 reps;Supine Hip ABduction/ADduction: AAROM;Both;10 reps;Supine (with manual tracking resistence)    General Comments        Pertinent Vitals/Pain 5/10 pain perianal and at R hip RN provided medication to assist with pain control and patient repositioned for comfort     Home Living  Prior Function            PT Goals (current goals can now be found in the care plan section) Acute Rehab PT Goals Patient Stated Goal: States he wants to go home, but understands that rehab at SNF will make him stronger and better able to manage at home PT Goal Formulation: With patient Time For Goal Achievement: 08/31/13 Potential to Achieve Goals: Good Progress towards PT goals: Progressing toward goals;Goals met  and updated - see care plan    Frequency  Min 5X/week    PT Plan Current plan remains appropriate    Co-evaluation             End of Session Equipment Utilized During Treatment: Gait belt Activity Tolerance: Patient tolerated treatment well Patient left: in chair;with call bell/phone within reach;with family/visitor present     Time: 4859-2763 PT Time Calculation (min): 35 min  Charges:  $Gait Training: 8-22 mins $Therapeutic Exercise: 8-22 mins                    G Codes:      Quin Hoop 08/17/2013, 11:56 AM [name], [place] Pager [phone] Office [phone]

## 2013-08-17 NOTE — Care Management Note (Addendum)
CARE MANAGEMENT NOTE 08/17/2013  Patient:  Gary [name], Gary [name]   Account Number:  [id number]  Date Initiated:  08/04/2013  Documentation initiated by:  Gary [name]  Subjective/Objective Assessment:   80 yr old male s/p right hip hemiarthroplasty.     Action/Plan:   Case manager spoke with patient, wife and daughter concerning home health and DME needs at discharge. Choice offered. Referral called to Gary [name],  [place].  08/17/13 Plan for SNF due to need for rehab however now home with [facility].   Anticipated DC Date:  08/18/2013   Anticipated DC Plan:  [place]  In-house referral  Clinical Social Worker      DC Forensic scientist  CM consult      [name] Choice  HOME HEALTH  DURABLE MEDICAL EQUIPMENT   Choice offered to / List presented to:  C-1 Patient   DME arranged  [place]  3-N-1  HOSPITAL BED      DME agency  [employer]     [place] arranged  HH-2 PT  HH-1 RN  [place].   Status of service:   Medicare Important Message given?  YES (If response is "NO", the following Medicare IM given date fields will be blank) Date Medicare IM given:  08/06/2013 Medicare IM given by:  Gary [name] Date Additional Medicare IM given:  08/09/2013 Additional Medicare IM given by:  Gary [name] Medicare IM given 08/17/2013 , signed by wife and copy to wife, by Gary Pang RN MPH, case manger Discharge Disposition:    Per UR Regulation:  Reviewed for med. necessity/level of care/duration of stay  If discussed at [place] of Stay Meetings, dates discussed:  June 30, July 2 and August 17, 2013  Comments:  08/17/2013 This CM spoke with family re plan to d/c to SNF for short term rehab. They are now planning again to go home, after much discussion they still wish to go home. Wife given list of [place] aide providers, as pt is requiring significant amt of personal care. [place] notified and we will  obtain orders for Gary [name] LP, HHPT, Gary [name], Gary aide and Social worker per [name] request so if pt needs to be placed for short term rehab after d/c. Pt again requesting hospital bed, per pt and family he has a rolling walker, 3:1, wheelchair etc. This CM discouraged the plan to d/c to home given the amount of person care and need for daily rehab. Pt seems to be convinced that all he needs to recover at home is a hospital bed. Gary Pang RN MPH, case manager, [phone]   08/13/13  [place], RN BSN Case Manager Spoke with Gary [name] concerning discharge plan. She states she wants her husband to go home, not SNF. States he is familiar with his surroundings and will improve better at home. Gary [name] states her sister-in-law is an Therapist, sports and her neighbor and they will be assisting her. Patient was established with [facility] [place] [facility] at beginning od admission. Case manager notified Gary [name], Gary [name] of discharge plan. Patient will require ambulance transport home. CM will notify Education officer, museum.        08/09/13 1600 [place] RN MSN BSN CCM CSW following for SNF placement.  08/06/13 [place] MSN BSN CCM Transfer to 2C 2/2 hematemesis.

## 2013-08-17 NOTE — Progress Notes (Signed)
Occupational Therapy Treatment Patient Details Name: Gary [name] MRN: [id number] DOB: March 01, 1932 Today's Date: 08/17/2013    History of present illness 80 yo male s/p Rt THA ( direct anterior). Pt developed post-op ileus  and underwent EDG 08/05/13. PMHx- arthritis, anxiety, CAD (s/p stents), back surgery x 3, colectomy. 08/05/13 EGD + gastritis and CT suspicious for ilieus. C. Diff +   OT comments  Pt making progress with functional goals, however pt with slow pace and increased time required to complete tasks this session due to reports of hip/back pain and fatigue. Pt states he wants to go home, but understands that rehab at SNF will make him stronger and better able to manage at home, however pt/family have decided to return home with [facility] instead. OT will continue to follow acutely  Follow Up Recommendations  SNF (pt/family wants to return home now with [facility] instead)    Equipment Recommendations  None recommended by OT    Recommendations for Other Services      Precautions / Restrictions Precautions Precautions: Fall Precaution Comments: Direct anterior THA with no precautions; TED hose Restrictions Weight Bearing Restrictions: No RLE Weight Bearing: Weight bearing as tolerated       Mobility Bed Mobility Overal bed mobility: Needs Assistance Bed Mobility: Supine to Sit     Supine to sit: Min assist     General bed mobility comments: pt up in recliner upon entering room  Transfers Overall transfer level: Needs assistance Equipment used: Rolling walker (2 wheeled) Transfers: Sit to/from Stand Sit to Stand: Min assist         General transfer comment: Verbal cues for hand placement and technique.  Assist to rise to standing and for balance    Balance Overall balance assessment: Needs assistance Sitting-balance support: No upper extremity supported;Feet supported Sitting balance-Leahy Scale: Fair     Standing balance support: Single extremity supported;Bilateral  upper extremity supported;During functional activity Standing balance-Leahy Scale: Poor                     ADL       Grooming: Wash/dry face;Standing;Min guard;Wash/dry hands       Lower Body Bathing: Sit to/from stand;Moderate assistance;Sitting/lateral leans       Lower Body Dressing: Moderate assistance;Sit to/from stand   Toilet Transfer: Minimal assistance;Grab bars;Comfort height toilet;RW   Toileting- Clothing Manipulation and Hygiene: Sit to/from stand;Maximal assistance       Functional mobility during ADLs: Minimal assistance;Rolling walker General ADL Comments: pt required 2 attempts to stand for recliner before complete upright stand at RW due to reports of back/hip pain and fatigue. Pt reqiured increased time to complete tasks      Vision  no change from baseline                   Perception Perception Perception Tested?: No   Praxis Praxis Praxis tested?: Not tested    Cognition   Behavior During Therapy: [facility] for tasks assessed/performed Overall Cognitive Status: Within Functional Limits for tasks assessed                                   General Comments  Pt pleasant and cooperative    Pertinent Vitals/ Pain       6/10 back/hip pain reported, VSS  Frequency Min 2X/week     Progress Toward Goals  OT Goals(current goals can now be found in the care plan section)  Progress towards OT goals: Progressing toward goals  Acute Rehab OT Goals Patient Stated Goal: States he wants to go home, but understands that rehab at SNF will make him stronger and better able to manage at home  Plan  Pt/family now plan to d/c home with [facility]                    End of Session Equipment Utilized During Treatment: Gait belt;Rolling walker   Activity Tolerance Patient limited by fatigue;Patient limited by pain   Patient Left in chair;with call  bell/phone within reach;with family/visitor present, physician present             Time: [id number] OT Time Calculation (min): 26 min  Charges: OT General Charges $OT Visit: 1 Procedure OT Treatments $Self Care/Home Management : 8-22 mins $Therapeutic Activity: 8-22 mins  [name] 08/17/2013, 2:55 PM

## 2013-08-17 NOTE — Progress Notes (Signed)
Subjective: 12 Days Post-Op Procedure(s) (LRB): ESOPHAGOGASTRODUODENOSCOPY (EGD) (N/A) S/P right anterior THR  Activity level:  wbat Diet tolerance:  Progressing from clears Voiding:  Rectal tube and foley in place Patient reports pain as mild.    Objective: Vital signs in last 24 hours: Temp:  [97.9 F (36.6 C)-98.4 F (36.9 C)] 98.4 F (36.9 C) (07/07 1000) Pulse Rate:  [99-108] 101 (07/07 1000) Resp:  [17-20] 20 (07/07 1000) BP: (136-146)/(75-80) 136/77 mmHg (07/07 1000) SpO2:  [96 %-98 %] 98 % (07/07 1000) Weight:  [87.091 kg (192 lb)] 87.091 kg (192 lb) (07/06 2034)  Labs:  Recent Labs  08/15/13 0256 08/16/13 0408 08/17/13 0710  HGB 9.4* 9.3* 9.3*    Recent Labs  08/16/13 0408 08/17/13 0710  WBC 19.0* 16.0*  RBC 3.18* 3.10*  HCT 27.6* 27.3*  PLT 399 440*    Recent Labs  08/16/13 0408 08/17/13 0710  NA 136* 138  K 3.6* 3.0*  CL 100 101  CO2 23 22  BUN 31* 24*  CREATININE 1.63* 1.63*  GLUCOSE 139* 137*  CALCIUM 7.7* 7.9*   No results found for this basename: LABPT, INR,  in the last 72 hours  Physical Exam:  Neurologically intact Neurovascular intact Sensation intact distally Intact pulses distally Dorsiflexion/Plantar flexion intact Incision: dressing C/D/I No cellulitis present Compartment soft  Assessment/Plan:  12 Days Post-Op Procedure(s) (LRB): ESOPHAGOGASTRODUODENOSCOPY (EGD) (N/A) Up with therapy Plan for discharge tomorrow Discharge to SNF per medicine team. Patients staples were removed today. He will need daily dry dressing changes at SNF. Follow up with [place] in office in one week from today.  Currently being treated for C-diff.  SCD's for DVT prophylaxis with no anticoagulation due to GI issues  Continue current pain regimen.  Medicine managing sepsis, C-Diff with abx, Post-op ilius imporoving. ARF, pre-renal, currently receiving fluids. Hypotension, currently receiving fluids.  Appreciate medical input.      Ailea Rhatigan,  Larwance Sachs 08/17/2013, 3:42 PM

## 2013-08-17 NOTE — Progress Notes (Signed)
No change in current d/c plan- admission paperwork has been signed by patient's wife for placement at [facility].  CSW spoke to Admissions Coordinator- Narda Rutherford today to insure that they could accept patient with a flexicele tube in place. Patient is C-Diff + and continues to have loose stools.  Per Narda Rutherford- they can accept the flexicele tube and patient has a private room waiting due to the c-diff.  Per MD note- plan d/c to SNF tomorrow.  CSW will follow up with wife and patient tomorrow once d/c is confirmed.  Lorie Phenix. [place], [place]

## 2013-08-18 LAB — COMPREHENSIVE METABOLIC PANEL
ALT: 17 U/L (ref 0–53)
AST: 29 U/L (ref 0–37)
Albumin: 2.2 g/dL — ABNORMAL LOW (ref 3.5–5.2)
Alkaline Phosphatase: 74 U/L (ref 39–117)
Anion gap: 16 — ABNORMAL HIGH (ref 5–15)
BUN: 23 mg/dL (ref 6–23)
CALCIUM: 8 mg/dL — AB (ref 8.4–10.5)
CO2: 20 meq/L (ref 19–32)
CREATININE: 1.71 mg/dL — AB (ref 0.50–1.35)
Chloride: 101 mEq/L (ref 96–112)
GFR calc non Af Amer: 36 mL/min — ABNORMAL LOW (ref >90)
GFR, EST AFRICAN AMERICAN: 42 mL/min — AB (ref >90)
GLUCOSE: 136 mg/dL — AB (ref 70–99)
Potassium: 3.8 mEq/L (ref 3.7–5.3)
SODIUM: 137 meq/L (ref 137–147)
TOTAL PROTEIN: 5.6 g/dL — AB (ref 6.0–8.3)
Total Bilirubin: 0.5 mg/dL (ref 0.3–1.2)

## 2013-08-18 LAB — CBC WITH DIFFERENTIAL/PLATELET
BASOS ABS: 0 10*3/uL (ref 0.0–0.1)
Basophils Relative: 0 % (ref 0–1)
Eosinophils Absolute: 0.1 10*3/uL (ref 0.0–0.7)
Eosinophils Relative: 1 % (ref 0–5)
HCT: 26.5 % — ABNORMAL LOW (ref 39.0–52.0)
Hemoglobin: 9 g/dL — ABNORMAL LOW (ref 13.0–17.0)
LYMPHS ABS: 0.7 10*3/uL (ref 0.7–4.0)
LYMPHS PCT: 5 % — AB (ref 12–46)
MCH: 30.5 pg (ref 26.0–34.0)
MCHC: 34 g/dL (ref 30.0–36.0)
MCV: 89.8 fL (ref 78.0–100.0)
MONOS PCT: 7 % (ref 3–12)
Monocytes Absolute: 0.9 10*3/uL (ref 0.1–1.0)
NEUTROS PCT: 87 % — AB (ref 43–77)
Neutro Abs: 11.6 10*3/uL — ABNORMAL HIGH (ref 1.7–7.7)
Platelets: 359 10*3/uL (ref 150–400)
RBC: 2.95 MIL/uL — ABNORMAL LOW (ref 4.22–5.81)
RDW: 15.8 % — AB (ref 11.5–15.5)
WBC: 13.3 10*3/uL — AB (ref 4.0–10.5)

## 2013-08-18 LAB — PHOSPHORUS: Phosphorus: 3.5 mg/dL (ref 2.3–4.6)

## 2013-08-18 LAB — MAGNESIUM: Magnesium: 1.6 mg/dL (ref 1.5–2.5)

## 2013-08-18 NOTE — Progress Notes (Signed)
Physical Therapy Treatment Patient Details Name: Gary [name] MRN: [id number] DOB: [birth year] Today's Date: 08/18/2013    History of Present Illness 80 yo male s/p Rt THA ( direct anterior). Pt developed post-op ileus  and underwent EDG 08/05/13. PMHx- arthritis, anxiety, CAD (s/p stents), back surgery x 3, colectomy. 08/05/13 EGD + gastritis and CT suspicious for ilieus. C. Diff +    PT Comments    Agreeable to therex; I am glad pt decided to continue his rehab at SNF, where he will receive daily therapy and continue his upward momentum for functional mobility  Follow Up Recommendations  SNF;Supervision/Assistance - 24 hour     Equipment Recommendations  None recommended by PT    Recommendations for Other Services       Precautions / Restrictions Precautions Precautions: Fall Precaution Comments: Direct anterior THA with no precautions; TED hose    Mobility  Bed Mobility                  Transfers                    Ambulation/Gait                 Stairs            Wheelchair Mobility    Modified Rankin (Stroke Patients Only)       Balance                                    Cognition Arousal/Alertness: Awake/alert Behavior During Therapy: WFL for tasks assessed/performed (though somewhat sleepy; pt attributes this to Celexa)                        Exercises Total Joint Exercises Ankle Circles/Pumps: AROM;Both;10 reps;Supine Quad Sets: AROM;Both;10 reps;Supine Gluteal Sets: AROM;Both;10 reps;Supine Towel Squeeze: AROM;Both;10 reps;Supine Heel Slides: AROM;Both;10 reps;Supine Hip ABduction/ADduction: AAROM;Both;10 reps;Supine (with manual tracking resistence)    General Comments        Pertinent Vitals/Pain Some soreness with therex patient repositioned for comfort     Home Living                      Prior Function            PT Goals (current goals can now be found in the care  plan section) Acute Rehab PT Goals Patient Stated Goal: Would like to perform therex today PT Goal Formulation: With patient Time For Goal Achievement: 08/31/13 Potential to Achieve Goals: Good Progress towards PT goals: Progressing toward goals    Frequency  Min 5X/week    PT Plan Current plan remains appropriate    Co-evaluation             End of Session   Activity Tolerance: Patient tolerated treatment well Patient left: in bed;with call bell/phone within reach;with family/visitor present (bed in semi-chair position, prepping for lunch)     Time: [id number] PT Time Calculation (min): 12 min  Charges:  $Therapeutic Exercise: 8-22 mins                    G Codes:      Quin Hoop 08/18/2013, 12:58 PM [name], [place] Pager [phone] Office [phone]

## 2013-08-18 NOTE — Progress Notes (Signed)
OT Cancellation Note  Patient Details Name: Gary [name] MRN: [id number] DOB: 23-Jun-1932   Cancelled Treatment:    Reason Eval/Treat Not Completed: Other (comment). Pt preparing to d/c to SNF  [name] 08/18/2013, 2:13 PM

## 2013-08-18 NOTE — Discharge Summary (Signed)
Physician Discharge Summary  Gary [name] [id number] DOB: 23-Oct-1932 DOA: 08/03/2013  PCP: [name], MD  Admit date: 08/03/2013 Anticipated Discharge date: 08/18/2013  Time spent: 35 minutes  Recommendations for Outpatient Follow-up:  1. New Medication: Oral vancomycin taper for the next months 2. Pt being discharged to short term SNF at [facility] 3. Medication adjustment: Pt will stop her lasix for 1 month 4. Followup: pt will need a renal protocol MRI with and without contrast to identify neoplasm versus complex cyst in a month (after  5. Follow up with orthopedics as recommended in one week.  6. Recommend daily dry dressing as per orthopedics  7. Please check CBC and BMP on Monday.   Discharge Diagnoses:  Principal Problem:   Severe sepsis Active Problems:   BPH (benign prostatic hyperplasia)   Ileus, postoperative   C. difficile colitis   S/P total hip arthroplasty   Degenerative joint disease (DJD) of hip   Urinary retention   Upper GI bleeding   Acute esophagitis   Renal mass, left   Hypotension, unspecified   Complex renal cyst   Acute blood loss anemia   Encephalopathy   Hypokalemia   Acute renal failure   Chronic kidney disease, stage 3   Anemia in chronic kidney disease   PAF (paroxysmal atrial fibrillation)   Metabolic acidosis   Discharge Condition: Improved, being discharged to SNF  Diet recommendation: Heart healthy  Filed Weights   08/16/13 0700 08/16/13 2034 08/17/13 2030  Weight: 90.1 kg (198 lb 10.2 oz) 87.091 kg (192 lb) 87.091 kg (192 lb)    History of present illness:  80 year old white male past medical history of recurrent C. difficile colitis, stage III CKD and CAD admitted to the orthopedic service on 6/23 for a planned right hip arthroplasty secondary to DJD that day. On 6/25, patient started complaining of increased abdominal tenderness and distention plus episode of coffee-ground emesis. Patient was transferred to step down  unit, [facility] hospital[facility] and gastroenterology were consulted.   Hospital Course:  Hospitalists assumed care as primary attending. Patient's ileus was felt to be postop. He was managed conservatively with NG tube placement. He also developed urinary retention-Foley catheter placed as well as some encephalopathy. In addition, by 6/26, patient's creatinine had started to rise we'll. He started developing diarrhea positive for C. Difficile and treatment was started 6/27 evening. Over the next 5 days, patient's various medical issues improved overall. He was transferred from step down to the floor on the evening of 7/2. Pt's WBC remained persistently elevated & with concerns that his sepsis was not yet resolving and was transferred back to stepdown on 7/3.  Since transferred to step down, patient improved, replacing volume and bicarbonate. Clinically better as well. Transferred out of the unit 7/5.he will be discharged to [facility] when bed available, possibly on 7/8  1. Sepsis-resolved: Patient net criteria with hypotension, tachycardia and marked leukocytosis in the context of C. difficile colitis. However, by treating bicarbonate losses with drip, white blood cell count has noticeably declined as of 7/5. This may be more stressed margination and not failure of infection. In addition, metabolic acidosis resolved.  2. Lactic acidosis/metabolic acidosis: Anion gap on 7/4 increased to 21. Mild lactic acidosis. Discussed with critical care and felt that this was likely secondary to bicarbonate losses from continued stool output. Significant improvement after treating with bicarbonate drip  3. Atrial fibrillation? He has history of paroxysmal atrial fibrillation which last occurred during a septic episode. On 7/4 he  was tachycardic and looked briefly be in atrial fibrillation. By the time EKG was checked, patient looked to be back in normal sinus rhythm with PACs which is what the EKG showed. Monitor closely.  Remains borderline tachycardic, likely from volume depletion.  BBlocker   4. Urinary retention: Resolved. Patient had Foley catheter placed on 6/26 it was kept there until patient's blood pressures were more stable. He was discontinued on 7/1 and patient has continued to have good voiding.  Prostate medications restarted.  5. Encephalopathy: Resolved. Patient's confusion was felt to be multifactorial, in part medication related-from narcotics as well as infection from C. Difficile and mild uremia from urinary retention. With limiting narcotics, treatment of infection and improvement in renal function, his mentation has since improved.  6. Ileus: Resolved. This is in the setting of a previous partial colon resection for diverticulitis and hemorrhoidal bleeding. Initially felt to be postop, made worse by C. difficile colitis. NG tube was placed. IV Flagyl and rectal vancomycin. Patient continued to improve and by 6/30, NG tube removed. Given lactic acidosis seen 7/4, axr done & noted persistent ileus. Pt himself is asymptomatic of this, so we are continuing him on soft diet  7. Esophagitis: Improving. PPI changed to Pepcid in setting of C. difficile. No further episodes of hematemesis since.  8. Anemia: Stable. Aside from the acute episode of hematemesis, patient likely has anemia of chronic renal disease. Hemoglobin has been stable at 9.3 since 7/5.  9. Acute renal failure in the setting of Stage III chronic kidney disease: Patient's creatinine peaked to as high as 5.4 on 6/28. Given markedly elevated BUN, this is likely consistent with pre-renal azotemia from C. difficile diarrhea volume loss plus urinary retention. With IV fluids and removal of Foley catheter, creatinine continues to improve on a daily basis, back to baseline at 1.677/6.  10. Left renal cystic mass: Incidentally seen during CT of the abdomen scan. Recommendation is following resolution of illness, renal protocol MRI with and without  contrast to identify neoplasm versus complex cyst  11. C. difficile colitis: Patient's third episode overall. PPI held. Has completed approximately 10 days of vancomycin and Flagyl of 6 week course. Initially had a Flexiseal in place. Note that his stools are always close to liquid given previous colonic resection. Stool output continues, have encouraged him outside food to improve his appetite and intake   12. CAD: Stable medical issue during this hospitalization.  13. Hypertension: Patient oral antihypertensives initially held secondary to hypovolemia/hypotension.  Restarted on 7/7 in anticipation for DC on 7/8  14. Obesity: Patient meets criteria with BMI of 33  15. Deconditioning: Patient has been evaluated by physical and occupational therapy who recommended skilled nursing after discharge  16. hypokalemia: Secondary to volume losses from diarrhea. Continuing to replace on a daily basis  17. Status post hip arthroplasty: Overall stable. CT of right femur done looking for other source of infection which was negative for that. Was noted to have a small bone fragment from the proximal femur adjacent to the lesser trochanter is favored to represent periprosthetic fracture associated with recent hip arthroplasty.  Outpt followup with Ortho in 1 weeks  Procedures: Endoscopy done 6/25: Mild esophagitis noted. Food residue consistent with postop ileus noted. Normal duodenum. No signs of bleeding.   Consultations:  [place] [facility]  Orthopedic surgery   Discharge Exam: Filed Vitals:   08/18/13 0532  BP: 108/69  Pulse: 97  Temp: 99.6 F (37.6 C)  Resp: 17  General: Alert and oriented x3, no acute distress Cardiovascular: Regular rate and rhythm, S1-S2, borderline tachycardia Respiratory: Clear to auscultation bilaterally  Discharge Instructions You were cared for by a hospitalist during your hospital stay. If you have any questions about your discharge medications or  the care you received while you were in the hospital after you are discharged, you can call the unit and asked to speak with the hospitalist on call if the hospitalist that took care of you is not available. Once you are discharged, your primary care physician will handle any further medical issues. Please note that NO REFILLS for any discharge medications will be authorized once you are discharged, as it is imperative that you return to your primary care physician (or establish a relationship with a primary care physician if you do not have one) for your aftercare needs so that they can reassess your need for medications and monitor your lab values.     Medication List    STOP taking these medications       furosemide 20 MG tablet  Commonly known as:  LASIX      TAKE these medications       aspirin EC 81 MG tablet  Take 81 mg by mouth daily.     bisoprolol 5 MG tablet  Commonly known as:  ZEBETA  Take 0.5 tablets (2.5 mg total) by mouth daily.     citalopram 40 MG tablet  Commonly known as:  CELEXA  Take 1 tablet (40 mg total) by mouth daily.     doxazosin 8 MG tablet  Commonly known as:  CARDURA  Take 8 mg by mouth at bedtime.     fexofenadine 180 MG tablet  Commonly known as:  ALLEGRA  Take 180 mg by mouth daily.     FLONASE 50 MCG/ACT nasal spray  Generic drug:  fluticasone  Place 2 sprays into the nose daily.     gabapentin 400 MG capsule  Commonly known as:  NEURONTIN  Take 400 mg by mouth 3 (three) times daily.     HYDROcodone-acetaminophen 5-325 MG per tablet  Commonly known as:  NORCO/VICODIN  Take 1 tablet by mouth every 4 (four) hours as needed. For pain     LORazepam 0.5 MG tablet  Commonly known as:  ATIVAN  Take 0.25 mg by mouth at bedtime.     nitroGLYCERIN 0.4 MG SL tablet  Commonly known as:  NITROSTAT  Place 0.4 mg under the tongue every 5 (five) minutes as needed for chest pain. x3 doses as needed for chest pain     ondansetron 4 MG tablet   Commonly known as:  ZOFRAN  Take 1 tablet (4 mg total) by mouth every 6 (six) hours as needed for nausea.     saccharomyces boulardii 250 MG capsule  Commonly known as:  FLORASTOR  Take 1 capsule (250 mg total) by mouth 2 (two) times daily.     simvastatin 40 MG tablet  Commonly known as:  ZOCOR  Take 40 mg by mouth daily.     tamsulosin 0.4 MG Caps capsule  Commonly known as:  FLOMAX  Take 0.4 mg by mouth daily after supper.     testosterone 50 MG/5GM (1%) Gel  Commonly known as:  ANDROGEL  Place 5 g onto the skin daily. Applied to shoulder     traMADol-acetaminophen 37.5-325 MG per tablet  Commonly known as:  ULTRACET  Take 2 tablets by mouth 3 (three) times daily as needed for  moderate pain.     vancomycin 50 mg/mL oral solution  Commonly known as:  VANCOCIN  125 mg po bid x 7 days, then daily x 7 days, then every other day x 7 days, then every 3 days x 14 days       Allergies  Allergen Reactions  . Naproxen Rash  . Clopidogrel Bisulfate Other (See Comments)    H/O diverticulitis; prone to rectal bleeding.  Plavix complicated this.  jkl  . Sulfonamide Derivatives Hives  . Other Other (See Comments)    Narcotics-C. Diff  . Plavix [Clopidogrel Bisulfate]   . Latex Rash  . Tape Itching and Rash    Latex-Adhesive tape   Follow-up Information   Follow up with DALLDORF,PETER G, MD. Call in 1 week.   Specialty:  Orthopedic Surgery   Contact information:   [address] [place] [address] [phone]       Follow up with GREEN, Keenan Bachelor, MD In 1 month.   Specialty:  Internal Medicine   Contact information:   [address] [place] [address] [place] [address] [phone]        The results of significant diagnostics from this hospitalization (including imaging, microbiology, ancillary and laboratory) are listed below for reference.    Significant Diagnostic Studies: Dg Chest 2 View  08/12/2013   CLINICAL DATA:  Leukocytosis  EXAM: CHEST  2 VIEW   COMPARISON:  08/07/2013  FINDINGS: Cardiac shadow is stable. The lungs are well aerated. Elevation of the right hemidiaphragm is seen. The bony structures are within normal limits.  IMPRESSION: No acute abnormality noted. No significant change from the prior study is seen.   Electronically Signed   By: Inez Catalina M.D.   On: 08/12/2013 08:19   Dg Hip Operative Right  08/03/2013   CLINICAL DATA:  Right total hip arthroplasty  EXAM: DG OPERATIVE RIGHT HIP  TECHNIQUE: A single spot fluoroscopic AP image of the right hip is submitted.  COMPARISON:  None.  FINDINGS: C-arm spot film shows the femoral and acetabular components of the right hip replacement to be in good position. No complicating features are seen.  IMPRESSION: Right total hip replacement.   Electronically Signed   By: Ivar Drape M.D.   On: 08/03/2013 13:17   Dg Abd 1 View  08/07/2013    IMPRESSION: Similar small bowel dilatation. Adynamic ileus versus partial small bowel obstruction. No free intraperitoneal air or other acute complication, given limitation of portable supine radiograph.   Electronically Signed   By: Abigail Miyamoto M.D.   On: 08/07/2013 13:33   Ct Abdomen Pelvis W Contrast  08/05/2013    IMPRESSION: 1. Subtotal colectomy. All of the small bowel and the small residual amount of colon appears dilated, favoring ileus. 2. There is an abnormal complex 2.2 cm lesion in the left mid kidney. In the non acute setting, renal protocol MRI with and without contrast is recommended to further characterize this lesion, which could be neoplastic or could be a complex cyst. 3. Trace bilateral pleural effusions with mild bibasilar atelectasis. 4. Atherosclerosis.   Electronically Signed   By: Sherryl Barters M.D.   On: 08/05/2013 23:33   Ct Femur Right Wo Contrast  08/12/2013     IMPRESSION: 1. No cause for leukocytosis identified in CT of the RIGHT thigh. 2. Small bone fragment from the proximal femur adjacent to the lesser trochanter is favored  to represent periprosthetic fracture associated with recent hip arthroplasty this could also represent  avulsion associated with the adductor insertion on the proximal femur; underlying osteolytic lesion cannot be excluded however is considered unlikely. If the patient has a history of malignancy, whole-body bone scan could be considered in follow-up to assess for other foci of osseous disease.   Electronically Signed   By: Dereck Ligas M.D.   On: 08/12/2013 14:32   Dg Chest Port 1 View  08/13/2013    IMPRESSION: Stable cardiac enlargement and central vascular congestion.  Low lung volumes with vascular crowding and bibasilar atelectasis.   Electronically Signed   By: Kalman Jewels M.D.   On: 08/13/2013 14:44   Dg Chest Port 1 View  08/07/2013    IMPRESSION: No evidence of pneumonia.  Cardiomegaly with low lung volumes and bibasilar volume loss.   Electronically Signed   By: Abigail Miyamoto M.D.   On: 08/07/2013 13:45   Dg Abd Portable 1v  08/14/2013     IMPRESSION: Stable minimal small bowel ileus or partial obstruction.   Electronically Signed   By: Enrique Sack M.D.   On: 08/14/2013 10:27   Dg Abd Portable 1v  08/09/2013     IMPRESSION: Improving small bowel dilatation.  No acute abnormality is noted.   Electronically Signed   By: Inez Catalina M.D.   On: 08/09/2013 07:42   Dg Abd Portable 1v  08/06/2013     IMPRESSION: Persistent prominent distention small large bowel most consistent adynamic ileus. Change followup abdominal series suggested to demonstrate resolution and to exclude bowel obstruction.   Electronically Signed   By: Marcello Moores  Register   On: 08/06/2013 08:48    Microbiology: Recent Results (from the past 240 hour(s))  URINE CULTURE     Status: None   Collection Time    08/11/13  8:51 AM      Result Value Ref Range Status   Specimen Description URINE, CATHETERIZED   Final   Special Requests NONE   Final   Culture  Setup Time     Final   Value: 08/11/2013 12:55     Performed at  [facility]     Final   Value: NO GROWTH     Performed at [facility]   Culture     Final   Value: NO GROWTH     Performed at [facility]   Report Status 08/12/2013 FINAL   Final  CULTURE, BLOOD (ROUTINE X 2)     Status: None   Collection Time    08/13/13  5:10 PM      Result Value Ref Range Status   Specimen Description BLOOD RIGHT ARM   Final   Special Requests BOTTLES DRAWN AEROBIC ONLY 3CC   Final   Culture  Setup Time     Final   Value: 08/14/2013 02:19     Performed at [facility]   Culture     Final   Value:        BLOOD CULTURE RECEIVED NO GROWTH TO DATE CULTURE WILL BE HELD FOR 5 DAYS BEFORE ISSUING A FINAL NEGATIVE REPORT     Performed at [facility]   Report Status PENDING   Incomplete  CULTURE, BLOOD (ROUTINE X 2)     Status: None   Collection Time    08/13/13  7:08 PM      Result Value Ref Range Status   Specimen Description BLOOD RIGHT HAND   Final   Special Requests BOTTLES DRAWN AEROBIC AND  ANAEROBIC 5CC   Final   Culture  Setup Time     Final   Value: 08/14/2013 02:19     Performed at [facility]   Culture     Final   Value:        BLOOD CULTURE RECEIVED NO GROWTH TO DATE CULTURE WILL BE HELD FOR 5 DAYS BEFORE ISSUING A FINAL NEGATIVE REPORT     Performed at [facility]   Report Status PENDING   Incomplete     Labs: Basic Metabolic Panel:  Recent Labs Lab 08/14/13 0244 08/15/13 0256 08/16/13 0408 08/17/13 0710 08/18/13 0430  NA 137 138 136* 138 137  K 3.0* 3.0* 3.6* 3.0* 3.8  CL 102 100 100 101 101  CO2 14* 22 23 22 20   GLUCOSE 171* 158* 139* 137* 136*  BUN 66* 50* 31* 24* 23  CREATININE 2.23* 2.00* 1.63* 1.63* 1.71*  CALCIUM 7.8* 7.8* 7.7* 7.9* 8.0*  MG 2.2 1.9 1.7 1.6 1.6  PHOS 4.1 3.4 3.3 3.6 3.5   Liver Function Tests:  Recent Labs Lab 08/14/13 0244 08/15/13 0256 08/16/13 0408 08/17/13 0710 08/18/13 0430  AST 15 17 28 22 29   ALT 10 9 10 14 17   ALKPHOS  52 60 60 70 74  BILITOT 0.4 0.5 0.6 0.5 0.5  PROT 5.3* 5.3* 5.3* 5.6* 5.6*  ALBUMIN 2.2* 2.2* 2.1* 2.2* 2.2*   No results found for this basename: LIPASE, AMYLASE,  in the last 168 hours No results found for this basename: AMMONIA,  in the last 168 hours CBC:  Recent Labs Lab 08/14/13 0244 08/15/13 0256 08/16/13 0408 08/17/13 0710 08/18/13 0430  WBC 23.9* 20.4* 19.0* 16.0* 13.3*  NEUTROABS 21.5* 18.4* 17.0* 14.2* 11.6*  HGB 10.1* 9.4* 9.3* 9.3* 9.0*  HCT 29.7* 27.3* 27.6* 27.3* 26.5*  MCV 87.1 86.1 86.8 88.1 89.8  PLT 422* 426* 399 440* 359   Cardiac Enzymes:  Recent Labs Lab 08/14/13 0955  TROPONINI <0.30   BNP: BNP (last 3 results)  Recent Labs  08/14/13 0955  PROBNP 751.1*    Signed:  Rumaldo Difatta  [facility] 08/18/2013, 8:58 AM

## 2013-08-18 NOTE — Progress Notes (Signed)
Patient discharged to [facility] by ambulance. Report was given prior to patients discharge. Patient's family was present at discharge and took belongings. Two extra flexicele bags were sent with paperwork. Patient was stable upon discharge.

## 2013-08-19 NOTE — Telephone Encounter (Signed)
Encounter complete. 

## 2013-08-19 NOTE — Progress Notes (Signed)
CSW received call from patient's wife Gary [name] stating that she was very unhappy about patient's d/c to [facility] and wants him at home today. She stated that she felt "coerced" by MD to take him to the SNF and only wants him at home. Patient's wife was very upset with this CSW and stated that she was taking him home today. CSW attempted to talk to her about contacting SW at [facility] to assist with dc home and she started yelling and stated "just forget it- I don't have anything else to say to you" and hung up on CSW.  CSW contacted patient's daughter Gary [name] who stated that she and her mother and insisted that patient return home but was informed by MD that he "had" to go to a nursing center. She is unhappy with the care at the facility as she stated that they gave patient Vicodin (this was a prescribed medication on the d/c summary. CSW provided support and daughter stated that her mother was already in contact with the SW at [facility] who was working to Surveyor, quantity for patient to go home with Home Health.  Daughter states that they have strong support from family and a neighbor who is a Marine scientist and she feels that her father will be happier at home.  CSW discussed concerns about patient having a flexicele tube and c-diff issues but daughter stated that they would be able to provide for his care.  CSW will sign off.  Lorie Phenix. Pauline Good, [place]

## 2013-08-19 NOTE — Clinical Social Work Placement (Addendum)
Clinical Social Work Department CLINICAL SOCIAL WORK PLACEMENT NOTE 08/18/2013  Patient:  Gary [name], Gary [name]  Account Number:  [id number] Admit date:  08/03/2013  Clinical Social Worker:  Ky Barban, Latanya Presser  Date/time:  08/10/2013 10:55 AM  Clinical Social Work is seeking post-discharge placement for this patient at the following level of care:   [place]   (*CSW will update this form in Epic as items are completed)     Patient/family provided with [place] Department of Clinical Social Work's list of facilities offering this level of care within the geographic area requested by the patient (or if unable, by the patient's family).  08/10/2013  Patient/family informed of their freedom to choose among providers that offer the needed level of care, that participate in Medicare, Medicaid or managed care program needed by the patient, have an available bed and are willing to accept the patient.    Patient/family informed of [facility]' ownership interest in [facility], as well as of the fact that they are under no obligation to receive care at this facility.  PASARR submitted to EDS on 08/10/2013 PASARR number received on 08/10/2013  FL2 transmitted to all facilities in geographic area requested by pt/family on  08/10/2013 FL2 transmitted to all facilities within larger geographic area on   Patient informed that his/her managed care company has contracts with or will negotiate with  certain facilities, including the following:     Patient/family informed of bed offers received:  08/13/2013 Patient chooses bed at [place] Physician recommends and patient chooses bed at    Patient to be transferred to [place] on  08/18/2013 Patient to be transferred to facility by Ambulance [facility]) Patient and family notified of transfer on 08/18/2013 Name of family member notified:  Wife- Lelon Frohlich and daughter Freddi Starr  The  following physician request were entered in Epic: Physician Request  Please sign FL2.  Please prepare priority discharge summary and prescriptions.    Additional Comments: 08/18/13  OK per MD for d/c today to SNF. CSW met with patient, his wife Webb Silversmith and daughter Freddi Starr once CSW confirmed with Janie at [facility] that they could accept a Flexicele rectal tube.  Wife confirmed that they have signed admit papers at the nursing center and stated that she needed to leave the hospital with their daughter to get something to eat and run a few errands. CSW met with patient after this and he asked many appropriate questions about the SNF and the need for the rectal tube. Patient stated that he was willing to go to rehab and is looking forward to going home.  He presented with a very positive attitude and demeaner.  Patient, wife and daughter's questions/concerns were addressed and no further needs identified. Nursing to call report. CSW signing off.

## 2013-08-20 ENCOUNTER — Encounter: Payer: Self-pay | Admitting: Gastroenterology

## 2013-08-20 LAB — CULTURE, BLOOD (ROUTINE X 2)
CULTURE: NO GROWTH
Culture: NO GROWTH

## 2013-08-30 ENCOUNTER — Encounter: Payer: Medicare Other | Admitting: Gastroenterology

## 2013-09-20 ENCOUNTER — Other Ambulatory Visit: Payer: Self-pay | Admitting: Internal Medicine

## 2013-09-20 DIAGNOSIS — R229 Localized swelling, mass and lump, unspecified: Principal | ICD-10-CM

## 2013-09-20 DIAGNOSIS — IMO0002 Reserved for concepts with insufficient information to code with codable children: Secondary | ICD-10-CM

## 2013-10-06 ENCOUNTER — Ambulatory Visit
Admission: RE | Admit: 2013-10-06 | Discharge: 2013-10-06 | Disposition: A | Discharge status: Home / Self Care | Payer: Medicare Other | Source: Ambulatory Visit | Attending: Internal Medicine | Admitting: Internal Medicine

## 2013-10-06 DIAGNOSIS — R229 Localized swelling, mass and lump, unspecified: Principal | ICD-10-CM

## 2013-10-06 DIAGNOSIS — IMO0002 Reserved for concepts with insufficient information to code with codable children: Secondary | ICD-10-CM

## 2013-10-06 MED ORDER — GADOBENATE DIMEGLUMINE 529 MG/ML IV SOLN
8.0000 mL | Freq: Once | INTRAVENOUS | Status: AC | PRN
  Administered 2013-10-06: 8 mL via INTRAVENOUS

## 2013-10-21 ENCOUNTER — Telehealth: Payer: Self-pay | Admitting: Gastroenterology

## 2013-10-22 NOTE — Telephone Encounter (Signed)
Spoke with the patient. He has had a hip replacement and a diagnosis of C-diff since he was last seen in the office. There is an imaging study done from August in which the radiologist recommends follow up study in 3 to 6 months. The patient denies any GI issues he is concerned about presently. An appointment is made for him on 11/29/13. He is concerned about a co-pay. I explained I do not know a lot about insurance, but I am under the impression that insurance determines the co-pay. Patient is encouraged to call with any concerns or questions prior to his appointment.

## 2013-10-23 ENCOUNTER — Other Ambulatory Visit: Payer: Self-pay | Admitting: Internal Medicine

## 2013-10-25 NOTE — Telephone Encounter (Signed)
Rx was sent to pharmacy electronically. 

## 2013-11-08 ENCOUNTER — Other Ambulatory Visit: Payer: Self-pay | Admitting: Internal Medicine

## 2013-11-21 ENCOUNTER — Other Ambulatory Visit: Payer: Self-pay | Admitting: Internal Medicine

## 2013-11-22 NOTE — Telephone Encounter (Signed)
Rx was sent to pharmacy electronically. 

## 2013-11-24 ENCOUNTER — Telehealth: Payer: Self-pay | Admitting: Gastroenterology

## 2013-11-24 NOTE — Telephone Encounter (Signed)
Spoke with the patient. He has had a hip replacement and a diagnosis of C-diff since he was last seen in the office. There is an imaging study done from August in which the radiologist recommends follow up study in 3 to 6 months..He would like Dr Deatra Ina to address his hemorrhoidal problem and maybe band it if needed. An appointment is made for him on 11/29/13. He is concerned about a co-pay. I explained I do not know a lot about insurance, but I am under the impression that insurance determines the co-pay. Patient is encouraged to call with any concerns or questions prior to his appointment.

## 2013-11-29 ENCOUNTER — Ambulatory Visit (INDEPENDENT_AMBULATORY_CARE_PROVIDER_SITE_OTHER): Payer: Self-pay | Admitting: Gastroenterology

## 2013-11-29 ENCOUNTER — Encounter: Payer: Self-pay | Admitting: Gastroenterology

## 2013-11-29 VITALS — BP 136/76 | HR 63 | Ht 65.0 in | Wt 201.0 lb

## 2013-11-29 DIAGNOSIS — K648 Other hemorrhoids: Secondary | ICD-10-CM

## 2013-11-29 NOTE — Patient Instructions (Signed)
HEMORRHOID BANDING PROCEDURE    FOLLOW-UP CARE   1. The procedure you have had should have been relatively painless since the banding of the area involved does not have nerve endings and there is no pain sensation.  The rubber band cuts off the blood supply to the hemorrhoid and the band may fall off as soon as 48 hours after the banding (the band may occasionally be seen in the toilet bowl following a bowel movement). You may notice a temporary feeling of fullness in the rectum which should respond adequately to plain Tylenol or Motrin.  2. Following the banding, avoid strenuous exercise that evening and resume full activity the next day.  A sitz bath (soaking in a warm tub) or bidet is soothing, and can be useful for cleansing the area after bowel movements.     3. To avoid constipation, take two tablespoons of natural wheat bran, natural oat bran, flax, Benefiber or any over the counter fiber supplement and increase your water intake to 7-8 glasses daily.    4. Unless you have been prescribed anorectal medication, do not put anything inside your rectum for two weeks: No suppositories, enemas, fingers, etc.  5. Occasionally, you may have more bleeding than usual after the banding procedure.  This is often from the untreated hemorrhoids rather than the treated one.  Don't be concerned if there is a tablespoon or so of blood.  If there is more blood than this, lie flat with your bottom higher than your head and apply an ice pack to the area. If the bleeding does not stop within a half an hour or if you feel faint, call our office at [phone] or go to the emergency room.  6. Problems are not common; however, if there is a substantial amount of bleeding, severe pain, chills, fever or difficulty passing urine (very rare) or other problems, you should call us at [phone] or report to the nearest emergency room.  7. Do not stay seated continuously for more than 2-3 hours for a day or two  after the procedure.  Tighten your buttock muscles 10-15 times every two hours and take 10-15 deep breaths every 1-2 hours.  Do not spend more than a few minutes on the toilet if you cannot empty your bowel; instead re-visit the toilet at a later time.  Your follow up appointment is scheduled on 01/17/2014 at [place]

## 2013-11-29 NOTE — Progress Notes (Signed)
Since his last visit Mr. Farrugia had a hip replacement.  This was complicated by pseudomembranous colitis.  Following his last band ligation he noticed that the band fell off by the time he got home.  He is having just a small amount of fecal leakage although it is significantly improved compared to prior to banding.  PROCEDURE NOTE: The patient presents with symptomatic grade *2**  hemorrhoids, requesting rubber band ligation of his/her hemorrhoidal disease.  All risks, benefits and alternative forms of therapy were described and informed consent was obtained.   The anorectum was pre-medicated with lubricant and nitroglycerine ointment The decision was made to band the **left lateral* internal hemorrhoid, and the [place] was used to perform band ligation without complication.  Digital anorectal examination was then performed to assure proper positioning of the band, and to adjust the banded tissue as required.  The patient was discharged home without pain or other issues.  Dietary and behavioral recommendations were given and along with follow-up instructions.    The patient will return as needed for  follow-up and possible additional banding as required. No complications were encountered and the patient tolerated the procedure well.

## 2013-12-28 ENCOUNTER — Ambulatory Visit (INDEPENDENT_AMBULATORY_CARE_PROVIDER_SITE_OTHER): Payer: Medicare Other | Admitting: Internal Medicine

## 2013-12-28 ENCOUNTER — Encounter: Payer: Self-pay | Admitting: Internal Medicine

## 2013-12-28 VITALS — BP 126/58 | HR 59 | Ht 66.0 in | Wt 204.1 lb

## 2013-12-28 DIAGNOSIS — I480 Paroxysmal atrial fibrillation: Secondary | ICD-10-CM

## 2013-12-28 DIAGNOSIS — I2510 Atherosclerotic heart disease of native coronary artery without angina pectoris: Secondary | ICD-10-CM

## 2013-12-28 DIAGNOSIS — I10 Essential (primary) hypertension: Secondary | ICD-10-CM

## 2013-12-28 DIAGNOSIS — E785 Hyperlipidemia, unspecified: Secondary | ICD-10-CM

## 2013-12-28 MED ORDER — NITROGLYCERIN 0.4 MG SL SUBL: 0.4000 mg | SUBLINGUAL_TABLET | SUBLINGUAL | Status: DC | PRN

## 2013-12-28 NOTE — Progress Notes (Signed)
OFFICE NOTE  Chief Complaint:  No complaints - had hip surgery recently, complicated by recurrent c. diffiile colitis  Primary Care Physician: [name], MD  HPI:  Gary [name] is a 80 year old gentleman who has a history of coronary disease, was previously followed by Dr. Rex Kras. In 2011 he had a nonSTEMI with an occluded circumflex artery and collaterals. Therefore, he has been treated medically. He has done pretty well with that. He did have chest pain in 2013 and ended up having gallbladder disease and had that removed in March 2013. From time to time, he complains of some fatigue and has been intolerant of Bystolic, was switched to bisoprolol, seems to be tolerating that pretty well. There is also concern about some lower extremity edema which he has at the sock line but does take furosemide and seems to be helping with that. Blood pressure seems to be pretty well controlled and he does do some exercise a couple times a week and is currently denying any chest pain, worsening shortness of breath, palpitations, presyncope or syncopal symptoms. He underwent back surgery this past year for back pain in June by Dr. Belinda Block and still reports problems with sciatic type nerve pain down the right leg. He currently was in the courtesy room in July with chest pain, ruled out for MI by troponins.  Chest pain at that time was felt to be atypical. He has not had recurrence and did not have any complications with his surgery. He currently denies any chest pain or shortness of breath with exertion. He did inquire as to whether it was safe to continue to take AndroGel, which should be okay for him.  His only other concern is some positional dizziness at night.  Gary [name] is without complaints today. He denies any shortness of breath or worsening chest pain. He recently had hip replacement and is doing well from that although had a protracted hospital course due to C. Difficile colitis. He says this  is the third episode that he's had.   PMHx:  Past Medical History  Diagnosis Date  . Hypertension   . Hemorrhoids   . Chronic kidney disease   . Kidney stones   . Dyslipidemia   . Diverticulosis   . Colon polyps   . Blood transfusion   . Anemia   . GI bleeding after 07/2009    "triggered by Plavix & ASA; from my diverticulosis"  . GERD (gastroesophageal reflux disease)   . Anxiety   . Arthritis     "hips and lower back"  . Chronic back pain greater than 3 months duration   . Diarrhea   . Arthritis pain   . Cholecystitis   . Heart attack 07/2009    nonSTEMI with an occluded circumflex artery and collaterals.  . Coronary artery disease   . History of shingles   . Heart murmur   . Neuromuscular disorder   . Coronary angioplasty status 2011  . Stomach problems   . Lower extremity edema     Taking furosemide and it seems to be helping.  . Nonspecific chest pain 08/26/2012    Went to ED 08/26/12  . Hyperlipidemia   . Chest pain 03/29/11    2D Echo without contrast on 03/29/11 - EF= 55-60%.  . H/O myocardial perfusion scan 04/04/11    For Pre-noncardiac surgery - EF = 67%, no ischemia, and is considered low risk.  . H/O Clostridium difficile infection   . Complication of anesthesia  once slow to wake up  . Adenomatous colon polyp     2010     Past Surgical History  Procedure Laterality Date  . Colon surgery      COLONOSCOPY  . Subtotal colectomy  01/24/2010  . Eye surgery  1942    "right eye was crossed; they  corrected it"  . Posterior fusion lumbar spine  08/1995    "bottom 4 vertebra"  . Back surgery  1997  . Cholecystectomy  04/12/2011    Procedure: CHOLECYSTECTOMY;  Surgeon: Adin Hector, MD;  Location: [facility];  Service: General;  Laterality: N/A;  . Cholecystectomy  04/12/2011    Procedure: LAPAROSCOPIC CHOLECYSTECTOMY;  Surgeon: Adin Hector, MD;  Location: [facility];  Service: General;  Laterality: N/A;  converted to open  . Lumbar fusion  07/22/2012  .  Cardiac catheterization  08/09/2009    Circumflex 100% occluded with right-to-left collaterals, mild irregularities in the proximal and distal LAD and diagonal system, normal LV systolic function, and minor infrarenal irregularities, but no abdominal aortic aneurysm.  . Total hip arthroplasty Right 08/03/2013    Procedure: TOTAL HIP ARTHROPLASTY ANTERIOR APPROACH;  Surgeon: Hessie Dibble, MD;  Location: [facility];  Service: Orthopedics;  Laterality: Right;  . Esophagogastroduodenoscopy N/A 08/05/2013    Procedure: ESOPHAGOGASTRODUODENOSCOPY (EGD);  Surgeon: Jerene Bears, MD;  Location: [facility];  Service: Gastroenterology;  Laterality: N/A;    FAMHx:  Family History  Problem Relation Age of Onset  . Heart disease Father   . Stroke Father   . Colon cancer Neg Hx   . Pancreatic cancer Mother   . Diverticulosis Sister     x4    SOCHx:   reports that he has never smoked. He has never used smokeless tobacco. He reports that he does not drink alcohol or use illicit drugs.  ALLERGIES:  Allergies  Allergen Reactions  . Naproxen Rash  . Clopidogrel Bisulfate Other (See Comments)    H/O diverticulitis; prone to rectal bleeding.  Plavix complicated this.  jkl  . Sulfonamide Derivatives Hives  . Other Other (See Comments)    Narcotics-C. Diff  . Plavix [Clopidogrel Bisulfate]   . Latex Rash  . Tape Itching and Rash    Latex-Adhesive tape    ROS: A comprehensive review of systems was negative.  HOME MEDS: Current Outpatient Prescriptions  Medication Sig Dispense Refill  . aspirin EC 81 MG tablet Take 81 mg by mouth daily.    . bisoprolol (ZEBETA) 5 MG tablet Take 0.5 tablets (2.5 mg total) by mouth daily. 45 tablet 2  . citalopram (CELEXA) 40 MG tablet TAKE 1 TABLET EVERY DAY 90 tablet 2  . doxazosin (CARDURA) 8 MG tablet Take 8 mg by mouth at bedtime.    . fexofenadine (ALLEGRA) 180 MG tablet Take 180 mg by mouth daily.     . fluticasone (FLONASE) 50 MCG/ACT nasal spray Place 2  sprays into the nose daily.     . furosemide (LASIX) 20 MG tablet TAKE 1 TABLET (20 MG TOTAL) BY MOUTH DAILY. 90 tablet 0  . gabapentin (NEURONTIN) 400 MG capsule Take 400 mg by mouth 3 (three) times daily.     Marland Kitchen HYDROcodone-acetaminophen (NORCO/VICODIN) 5-325 MG per tablet Take 1 tablet by mouth every 4 (four) hours as needed. For pain 30 tablet 0  . LORazepam (ATIVAN) 0.5 MG tablet Take 0.25 mg by mouth at bedtime.     . nitroGLYCERIN (NITROSTAT) 0.4 MG SL tablet Place 1 tablet (0.4  mg total) under the tongue every 5 (five) minutes as needed for chest pain. x3 doses as needed for chest pain 25 tablet 3  . ondansetron (ZOFRAN) 4 MG tablet Take 1 tablet (4 mg total) by mouth every 6 (six) hours as needed for nausea. 20 tablet 0  . saccharomyces boulardii (FLORASTOR) 250 MG capsule Take 1 capsule (250 mg total) by mouth 2 (two) times daily. 60 capsule 0  . simvastatin (ZOCOR) 40 MG tablet Take 40 mg by mouth daily.    . simvastatin (ZOCOR) 40 MG tablet TAKE 1 TABLET BY MOUTH AT BEDTIME 90 tablet 0  . Tamsulosin HCl (FLOMAX) 0.4 MG CAPS Take 0.4 mg by mouth daily after supper.    . testosterone (ANDROGEL) 50 MG/5GM GEL Place 5 g onto the skin daily. Applied to shoulder    . traMADol-acetaminophen (ULTRACET) 37.5-325 MG per tablet Take 2 tablets by mouth 3 (three) times daily as needed for moderate pain.     . vancomycin (VANCOCIN) 50 mg/mL oral solution 125 mg po bid x 7 days, then daily x 7 days, then every other day x 7 days, then every 3 days x 14 days 75 mL 0   No current facility-administered medications for this visit.    LABS/IMAGING: No results found for this or any previous visit (from the past 48 hour(s)). No results found.  VITALS: BP 126/58 mmHg  Pulse 59  Ht 5\' 6"  (1.676 m)  Wt 204 lb 1.6 oz (92.579 kg)  BMI 32.96 kg/m2  EXAM: General appearance: alert and no distress Neck: no carotid bruit and no JVD Lungs: clear to auscultation bilaterally Heart: regular rate and rhythm,  S1, S2 normal, no murmur, click, rub or gallop Abdomen: soft, non-tender; bowel sounds normal; no masses,  no organomegaly Extremities: extremities normal, atraumatic, no cyanosis or edema Pulses: 2+ and symmetric Skin: Skin color, texture, turgor normal. No rashes or lesions Neurologic: Grossly normal Psych: Mood, affect normal  EKG: Sinus bradycardia 59  ASSESSMENT: 1. Coronary artery disease status post occluded circumflex in 2011 with collaterals 2. Hypertension-controlled 3. Dyslipidemia-at goal 4. Legs/back pain - improved after hip/back surgery  PLAN: 1.   Mr. Vivo is doing well from a cardiac standpoint. He tolerated his most recent surgery which was hip replacement unfortunately developed C. Difficile colitis. He had no evidence of chest pain or manifestations of coronary disease arrhythmia. Blood pressure is well-controlled his cholesterol is been at goal. Would recommend continuing his current medications and we'll see him back annually.  Pixie Casino, MD, [facility] Attending Cardiologist [facility]  Justin Buechner C 12/28/2013, 5:00 PM

## 2013-12-28 NOTE — Patient Instructions (Signed)
Your physician wants you to follow-up in: 1 year with Dr. Hilty. You will receive a reminder letter in the mail two months in advance. If you don't receive a letter, please call our office to schedule the follow-up appointment.  

## 2013-12-30 ENCOUNTER — Other Ambulatory Visit: Payer: Self-pay | Admitting: Internal Medicine

## 2013-12-30 DIAGNOSIS — N23 Unspecified renal colic: Secondary | ICD-10-CM

## 2014-01-15 ENCOUNTER — Other Ambulatory Visit: Payer: Self-pay | Admitting: Internal Medicine

## 2014-01-17 ENCOUNTER — Ambulatory Visit (INDEPENDENT_AMBULATORY_CARE_PROVIDER_SITE_OTHER): Payer: Self-pay | Admitting: Gastroenterology

## 2014-01-17 ENCOUNTER — Encounter: Payer: Self-pay | Admitting: Gastroenterology

## 2014-01-17 VITALS — BP 130/72 | HR 64 | Ht 66.0 in | Wt 205.2 lb

## 2014-01-17 DIAGNOSIS — Z86010 Personal history of colonic polyps: Secondary | ICD-10-CM | POA: Insufficient documentation

## 2014-01-17 NOTE — Telephone Encounter (Signed)
Rx was sent to pharmacy electronically. 

## 2014-01-17 NOTE — Progress Notes (Signed)
      History of Present Illness:  [name] has returned for follow-up of hemorrhoids.  He is symptom-free.  Specifically, he is without rectal pain or bleeding.  He does have a very small skin tag which does not bother him.    Review of Systems: Pertinent positive and negative review of systems were noted in the above HPI section. All other review of systems were otherwise negative.    Current Medications, Allergies, Past Medical History, Past Surgical History, Family History and Social History were reviewed in [facility] record  Vital signs were reviewed in today's medical record. Physical Exam: General: Well developed , well nourished, no acute distress   See Assessment and Plan under Problem List

## 2014-01-17 NOTE — Assessment & Plan Note (Signed)
I explained to the patient that we generally stop routine colonoscopy for surveillance at 78-80.  He wishes to take this under advisement and will decide sometime in the next 6 months whether he wants to repeat exam.

## 2014-01-17 NOTE — Patient Instructions (Signed)
Follow up as needed

## 2014-01-22 ENCOUNTER — Other Ambulatory Visit: Payer: Self-pay | Admitting: Internal Medicine

## 2014-01-24 NOTE — Telephone Encounter (Signed)
Rx refill sent to patient pharmacy   

## 2014-01-31 ENCOUNTER — Ambulatory Visit
Admission: RE | Admit: 2014-01-31 | Discharge: 2014-01-31 | Disposition: A | Discharge status: Home / Self Care | Payer: 59 | Source: Ambulatory Visit | Attending: Internal Medicine | Admitting: Internal Medicine

## 2014-01-31 ENCOUNTER — Other Ambulatory Visit: Payer: Medicare Other

## 2014-01-31 DIAGNOSIS — N23 Unspecified renal colic: Secondary | ICD-10-CM

## 2014-01-31 MED ORDER — GADOBENATE DIMEGLUMINE 529 MG/ML IV SOLN
8.0000 mL | Freq: Once | INTRAVENOUS | Status: AC | PRN
  Administered 2014-01-31: 8 mL via INTRAVENOUS

## 2014-02-27 ENCOUNTER — Other Ambulatory Visit: Payer: Self-pay | Admitting: Internal Medicine

## 2014-03-22 ENCOUNTER — Other Ambulatory Visit: Payer: Medicare Other

## 2014-05-10 ENCOUNTER — Telehealth: Payer: Self-pay | Admitting: Gastroenterology

## 2014-05-10 ENCOUNTER — Other Ambulatory Visit: Payer: Self-pay

## 2014-05-10 DIAGNOSIS — K921 Melena: Secondary | ICD-10-CM

## 2014-05-10 NOTE — Telephone Encounter (Signed)
Blood seen in the stool. He does not see any blood with cleaning himself. Normal number of stools daily (4-5). No pain. No apetite changes. Please advise.

## 2014-05-10 NOTE — Telephone Encounter (Signed)
He needs a CBC.  Are his stool solid?

## 2014-05-10 NOTE — Telephone Encounter (Signed)
States he has not had solid stools since his surgery but his stools are his "norm" presently.

## 2014-05-11 ENCOUNTER — Other Ambulatory Visit: Payer: Self-pay

## 2014-05-11 ENCOUNTER — Other Ambulatory Visit (INDEPENDENT_AMBULATORY_CARE_PROVIDER_SITE_OTHER): Payer: Self-pay

## 2014-05-11 DIAGNOSIS — K921 Melena: Secondary | ICD-10-CM

## 2014-05-11 LAB — CBC WITH DIFFERENTIAL/PLATELET
Basophils Absolute: 0 10*3/uL (ref 0.0–0.1)
Basophils Relative: 0.4 % (ref 0.0–3.0)
EOS PCT: 3.7 % (ref 0.0–5.0)
Eosinophils Absolute: 0.2 10*3/uL (ref 0.0–0.7)
HEMATOCRIT: 40.4 % (ref 39.0–52.0)
Hemoglobin: 13.5 g/dL (ref 13.0–17.0)
LYMPHS PCT: 17 % (ref 12.0–46.0)
Lymphs Abs: 1.2 10*3/uL (ref 0.7–4.0)
MCHC: 33.5 g/dL (ref 30.0–36.0)
MCV: 88.5 fl (ref 78.0–100.0)
MONOS PCT: 6.1 % (ref 3.0–12.0)
Monocytes Absolute: 0.4 10*3/uL (ref 0.1–1.0)
NEUTROS ABS: 5 10*3/uL (ref 1.4–7.7)
Neutrophils Relative %: 72.8 % (ref 43.0–77.0)
Platelets: 185 10*3/uL (ref 150.0–400.0)
RBC: 4.56 Mil/uL (ref 4.22–5.81)
RDW: 13.8 % (ref 11.5–15.5)
WBC: 6.8 10*3/uL (ref 4.0–10.5)

## 2014-05-12 ENCOUNTER — Telehealth: Payer: Self-pay | Admitting: Gastroenterology

## 2014-05-12 NOTE — Telephone Encounter (Signed)
Normal cbc. Left message to call back.

## 2014-05-12 NOTE — Telephone Encounter (Signed)
Blood counts are excellent.  His is probably very mild hemorrhoidal bleeding.  No need to do any further therapy.  At most, I would give him a hemorrhoidal suppository for one week

## 2014-05-12 NOTE — Telephone Encounter (Signed)
This is the patient who has be seeing blood in the bowel movement. He has had bowel surgery and since that time he always has loose bowel movements. The CBC was normal. He still feels fine. He has had a hemorrhoid banding in the past. Wonders if he could have another hemorrhoid. See CBC results.

## 2014-05-13 ENCOUNTER — Other Ambulatory Visit: Payer: Self-pay

## 2014-05-13 MED ORDER — STARCH 51 % RE SUPP: RECTAL | Status: DC

## 2014-05-13 NOTE — Telephone Encounter (Signed)
R x to [facility] per patient request.

## 2014-05-13 NOTE — Telephone Encounter (Signed)
He would like to try treating the hemorrhoids. If you give me some options, I will see what his insurance will pay for.

## 2014-05-13 NOTE — Telephone Encounter (Signed)
Anusol before meals suppositories one daily at bedtime and every morning 7 days

## 2014-07-20 ENCOUNTER — Other Ambulatory Visit: Payer: Self-pay | Admitting: Internal Medicine

## 2014-07-20 NOTE — Telephone Encounter (Signed)
Rx(s) sent to pharmacy electronically.  

## 2014-10-04 ENCOUNTER — Other Ambulatory Visit: Payer: Self-pay | Admitting: Dermatology

## 2014-10-04 DIAGNOSIS — C4491 Basal cell carcinoma of skin, unspecified: Secondary | ICD-10-CM

## 2014-10-04 HISTORY — DX: Basal cell carcinoma of skin, unspecified: C44.91

## 2014-11-21 ENCOUNTER — Other Ambulatory Visit: Payer: Self-pay | Admitting: Internal Medicine

## 2014-11-21 NOTE — Telephone Encounter (Signed)
REFILL 

## 2014-12-26 ENCOUNTER — Encounter: Payer: Self-pay | Admitting: Internal Medicine

## 2014-12-26 ENCOUNTER — Ambulatory Visit (INDEPENDENT_AMBULATORY_CARE_PROVIDER_SITE_OTHER): Payer: Medicare Other | Admitting: Internal Medicine

## 2014-12-26 VITALS — BP 130/68 | HR 59 | Ht 66.5 in | Wt 213.3 lb

## 2014-12-26 DIAGNOSIS — I480 Paroxysmal atrial fibrillation: Secondary | ICD-10-CM

## 2014-12-26 DIAGNOSIS — N183 Chronic kidney disease, stage 3 unspecified: Secondary | ICD-10-CM

## 2014-12-26 DIAGNOSIS — I10 Essential (primary) hypertension: Secondary | ICD-10-CM

## 2014-12-26 DIAGNOSIS — I2510 Atherosclerotic heart disease of native coronary artery without angina pectoris: Secondary | ICD-10-CM | POA: Diagnosis not present

## 2014-12-26 DIAGNOSIS — Z01810 Encounter for preprocedural cardiovascular examination: Secondary | ICD-10-CM | POA: Insufficient documentation

## 2014-12-26 DIAGNOSIS — Z9660 Presence of unspecified orthopedic joint implant: Secondary | ICD-10-CM

## 2014-12-26 DIAGNOSIS — Z96649 Presence of unspecified artificial hip joint: Secondary | ICD-10-CM

## 2014-12-26 DIAGNOSIS — F411 Generalized anxiety disorder: Secondary | ICD-10-CM

## 2014-12-26 NOTE — Patient Instructions (Signed)
Your physician has requested that you have a lexiscan myoview. For further information please visit [email or web address]. Please follow instruction sheet, as given.  Your physician wants you to follow-up in: 12 months with Dr. Debara Pickett. You will receive a reminder letter in the mail two months in advance. If you don't receive a letter, please call our office to schedule the follow-up appointment.

## 2014-12-26 NOTE — Progress Notes (Signed)
OFFICE NOTE  Chief Complaint:  Left hip pain, contemplating replacement  Primary Care Physician: Gary Peaches, MD  HPI:  Gary [name] is a 81 year old gentleman who has a history of coronary disease, was previously followed by Dr. Rex [name]. In 2011 he had a nonSTEMI with an occluded circumflex artery and collaterals. Therefore, he has been treated medically. He has done pretty well with that. He did have chest pain in 2013 and ended up having gallbladder disease and had that removed in March 2013. From time to time, he complains of some fatigue and has been intolerant of Bystolic, was switched to bisoprolol, seems to be tolerating that pretty well. There is also concern about some lower extremity edema which he has at the sock line but does take furosemide and seems to be helping with that. Blood pressure seems to be pretty well controlled and he does do some exercise a couple times a week and is currently denying any chest pain, worsening shortness of breath, palpitations, presyncope or syncopal symptoms. He underwent back surgery this past year for back pain in June by Gary [name] and still reports problems with sciatic type nerve pain down the right leg. He currently was in the courtesy room in July with chest pain, ruled out for MI by troponins.  Chest pain at that time was felt to be atypical. He has not had recurrence and did not have any complications with his surgery. He currently denies any chest pain or shortness of breath with exertion. He did inquire as to whether it was safe to continue to take AndroGel, which should be okay for him.  His only other concern is some positional dizziness at night.  Gary [name] returns today for follow-up. He is complaining of left hip pain is been undergoing some injections. He is considering left hip replacement. He seems to have done fairly well from the right hip surgery. He does report some mild shortness of breath with exertion but is not as  active as he could be. Again he has known coronary disease with an occluded circumflex and collateral vessels found by catheterization in 2011. He has not had recurrent testing since that time. He is also reporting difficulty reaching climax during sex. He has a history of low testosterone on medication and previously was seeing Gary [name].  PMHx:  Past Medical History  Diagnosis Date  . Hypertension   . Hemorrhoids   . Chronic kidney disease   . Kidney stones   . Dyslipidemia   . Diverticulosis   . Colon polyps   . Blood transfusion   . Anemia   . GI bleeding after 07/2009    "triggered by Plavix & ASA; from my diverticulosis"  . GERD (gastroesophageal reflux disease)   . Anxiety   . Arthritis     "hips and lower back"  . Chronic back pain greater than 3 months duration   . Diarrhea   . Arthritis pain   . Cholecystitis   . Heart attack (Gary [name]) 07/2009    nonSTEMI with an occluded circumflex artery and collaterals.  . Coronary artery disease   . History of shingles   . Heart murmur   . Neuromuscular disorder ([place])   . Coronary angioplasty status 2011  . Stomach problems   . Lower extremity edema     Taking furosemide and it seems to be helping.  . Nonspecific chest pain 08/26/2012    Went to ED 08/26/12  . Hyperlipidemia   .  Chest pain 03/29/11    2D Echo without contrast on 03/29/11 - EF= 55-60%.  . H/O myocardial perfusion scan 04/04/11    For Pre-noncardiac surgery - EF = 67%, no ischemia, and is considered low risk.  . H/O Clostridium difficile infection   . Complication of anesthesia     once slow to wake up  . Adenomatous colon polyp     2010     Past Surgical History  Procedure Laterality Date  . Colon surgery      COLONOSCOPY  . Subtotal colectomy  01/24/2010  . Eye surgery  1942    "right eye was crossed; they  corrected it"  . Posterior fusion lumbar spine  08/1995    "bottom 4 vertebra"  . Back surgery  1997  . Cholecystectomy  04/12/2011    Procedure:  CHOLECYSTECTOMY;  Surgeon: Adin Hector, MD;  Location: [facility];  Service: General;  Laterality: N/A;  . Cholecystectomy  04/12/2011    Procedure: LAPAROSCOPIC CHOLECYSTECTOMY;  Surgeon: Adin Hector, MD;  Location: [facility];  Service: General;  Laterality: N/A;  converted to open  . Lumbar fusion  07/22/2012  . Cardiac catheterization  08/09/2009    Circumflex 100% occluded with right-to-left collaterals, mild irregularities in the proximal and distal LAD and diagonal system, normal LV systolic function, and minor infrarenal irregularities, but no abdominal aortic aneurysm.  . Total hip arthroplasty Right 08/03/2013    Procedure: TOTAL HIP ARTHROPLASTY ANTERIOR APPROACH;  Surgeon: Hessie Dibble, MD;  Location: [place];  Service: Orthopedics;  Laterality: Right;  . Esophagogastroduodenoscopy N/A 08/05/2013    Procedure: ESOPHAGOGASTRODUODENOSCOPY (EGD);  Surgeon: Jerene Bears, MD;  Location: [place];  Service: Gastroenterology;  Laterality: N/A;    FAMHx:  Family History  Problem Relation Age of Onset  . Heart disease Father   . Stroke Father   . Colon cancer Neg Hx   . Pancreatic cancer Mother   . Diverticulosis Sister     x4    SOCHx:   reports that he has never smoked. He has never used smokeless tobacco. He reports that he does not drink alcohol or use illicit drugs.  ALLERGIES:  Allergies  Allergen Reactions  . Naproxen Rash  . Clopidogrel Bisulfate Other (See Comments)    H/O diverticulitis; prone to rectal bleeding.  Plavix complicated this.  jkl  . Sulfonamide Derivatives Hives  . Other Other (See Comments)    Narcotics-C. Diff  . Plavix [Clopidogrel Bisulfate]   . Latex Rash  . Tape Itching and Rash    Latex-Adhesive tape    ROS: A comprehensive review of systems was negative except for: Genitourinary: positive for sexual problems Musculoskeletal: positive for hip pain  HOME MEDS: Current Outpatient Prescriptions  Medication Sig Dispense Refill  . ANDROGEL  PUMP 20.25 MG/ACT (1.62%) GEL     . aspirin EC 81 MG tablet Take 81 mg by mouth daily.    . bisoprolol (ZEBETA) 5 MG tablet TAKE 0.5 TABLETS (2.5 MG TOTAL) BY MOUTH DAILY. 45 tablet 3  . citalopram (CELEXA) 40 MG tablet TAKE 1 TABLET EVERY DAY 90 tablet 2  . doxazosin (CARDURA) 8 MG tablet Take 8 mg by mouth at bedtime.    . fexofenadine (ALLEGRA) 180 MG tablet Take 180 mg by mouth daily.     . fluticasone (FLONASE) 50 MCG/ACT nasal spray Place 2 sprays into the nose daily.     . furosemide (LASIX) 20 MG tablet TAKE 1 TABLET BY MOUTH DAILY  90 tablet 0  . gabapentin (NEURONTIN) 400 MG capsule Take 400 mg by mouth 3 (three) times daily.     Marland Kitchen LORazepam (ATIVAN) 0.5 MG tablet Take 0.25 mg by mouth at bedtime.     . nitroGLYCERIN (NITROSTAT) 0.4 MG SL tablet Place 1 tablet (0.4 mg total) under the tongue every 5 (five) minutes as needed for chest pain. x3 doses as needed for chest pain 25 tablet 3  . saccharomyces boulardii (FLORASTOR) 250 MG capsule Take 1 capsule (250 mg total) by mouth 2 (two) times daily. 60 capsule 0  . simvastatin (ZOCOR) 40 MG tablet Take 40 mg by mouth daily.    . Tamsulosin HCl (FLOMAX) 0.4 MG CAPS Take 0.4 mg by mouth daily after supper.    . traMADol-acetaminophen (ULTRACET) 37.5-325 MG per tablet Take 2 tablets by mouth 4 (four) times daily as needed for moderate pain.      No current facility-administered medications for this visit.    LABS/IMAGING: No results found for this or any previous visit (from the past 48 hour(s)). No results found.  VITALS: BP 130/68 mmHg  Pulse 59  Ht 5' 6.5" (1.689 m)  Wt 213 lb 5 oz (96.758 kg)  BMI 33.92 kg/m2  EXAM: General appearance: alert and no distress Neck: no carotid bruit and no JVD Lungs: clear to auscultation bilaterally Heart: regular rate and rhythm, S1, S2 normal, no murmur, click, rub or gallop Abdomen: soft, non-tender; bowel sounds normal; no masses,  no organomegaly Extremities: extremities normal,  atraumatic, no cyanosis or edema Pulses: 2+ and symmetric Skin: Skin color, texture, turgor normal. No rashes or lesions Neurologic: Grossly normal Psych: Mood, affect normal  EKG: Sinus bradycardia 59  ASSESSMENT: 1. Coronary artery disease status post occluded circumflex in 2011 with collaterals 2. Hypertension-controlled 3. Dyslipidemia-at goal 4. Legs/back pain - s/p R THR, now contemplating left hip replacement 5. Indeterminate risk for hip surgery  PLAN: 1.   Mr. Cotrone is contemplating left hip replacement. He has not had any coronary testing since 2011. He does have known coronary disease with collaterals. He reports a mild shortness of breath but no chest pain although his level of activity is quite diminished. Blood pressure is at goal. His cholesterol is been fairly well controlled. I would recommend a Lexiscan nuclear stress test prior to surgery in order to provide risk assessment. He also was concerned about inability to a climax during sex. There could be multiple causes for this including beta blocker, low testosterone, or other neurologic or mechanical reasons. I would recommend he follow-up with his urologist regarding this.  Pixie Casino, MD, [facility] Attending Cardiologist [place] 12/26/2014, 2:44 PM

## 2014-12-29 ENCOUNTER — Telehealth (HOSPITAL_COMMUNITY): Payer: Self-pay

## 2014-12-29 NOTE — Telephone Encounter (Signed)
Encounter complete. 

## 2015-01-03 ENCOUNTER — Inpatient Hospital Stay (HOSPITAL_COMMUNITY): Admission: RE | Admit: 2015-01-03 | Payer: Medicare Other | Source: Ambulatory Visit

## 2015-01-11 ENCOUNTER — Telehealth (HOSPITAL_COMMUNITY): Payer: Self-pay

## 2015-01-11 NOTE — Telephone Encounter (Signed)
Encounter complete. 

## 2015-01-13 ENCOUNTER — Ambulatory Visit (HOSPITAL_COMMUNITY)
Admission: RE | Admit: 2015-01-13 | Discharge: 2015-01-13 | Disposition: A | Discharge status: Home / Self Care | Payer: Medicare Other | Source: Ambulatory Visit | Attending: Cardiology | Admitting: Cardiology

## 2015-01-13 DIAGNOSIS — R079 Chest pain, unspecified: Secondary | ICD-10-CM | POA: Diagnosis not present

## 2015-01-13 DIAGNOSIS — R5383 Other fatigue: Secondary | ICD-10-CM | POA: Insufficient documentation

## 2015-01-13 DIAGNOSIS — R42 Dizziness and giddiness: Secondary | ICD-10-CM | POA: Diagnosis not present

## 2015-01-13 DIAGNOSIS — Z01810 Encounter for preprocedural cardiovascular examination: Secondary | ICD-10-CM | POA: Diagnosis not present

## 2015-01-13 DIAGNOSIS — Z6833 Body mass index (BMI) 33.0-33.9, adult: Secondary | ICD-10-CM | POA: Diagnosis not present

## 2015-01-13 DIAGNOSIS — R0602 Shortness of breath: Secondary | ICD-10-CM | POA: Insufficient documentation

## 2015-01-13 DIAGNOSIS — E669 Obesity, unspecified: Secondary | ICD-10-CM | POA: Insufficient documentation

## 2015-01-13 DIAGNOSIS — I2510 Atherosclerotic heart disease of native coronary artery without angina pectoris: Secondary | ICD-10-CM | POA: Diagnosis not present

## 2015-01-13 DIAGNOSIS — Z8249 Family history of ischemic heart disease and other diseases of the circulatory system: Secondary | ICD-10-CM | POA: Insufficient documentation

## 2015-01-13 DIAGNOSIS — I10 Essential (primary) hypertension: Secondary | ICD-10-CM | POA: Insufficient documentation

## 2015-01-13 DIAGNOSIS — R9439 Abnormal result of other cardiovascular function study: Secondary | ICD-10-CM | POA: Diagnosis not present

## 2015-01-13 LAB — MYOCARDIAL PERFUSION IMAGING
CHL CUP NUCLEAR SRS: 9
CSEPPHR: 79 {beats}/min
LVDIAVOL: 109 mL
LVSYSVOL: 49 mL
NUC STRESS TID: 1.22
Rest HR: 59 {beats}/min
SDS: 3
SSS: 12

## 2015-01-13 MED ORDER — TECHNETIUM TC 99M SESTAMIBI GENERIC - CARDIOLITE
31.4000 | Freq: Once | INTRAVENOUS | Status: AC | PRN
  Administered 2015-01-13: 31.4 via INTRAVENOUS

## 2015-01-13 MED ORDER — TECHNETIUM TC 99M SESTAMIBI GENERIC - CARDIOLITE
10.4000 | Freq: Once | INTRAVENOUS | Status: AC | PRN
  Administered 2015-01-13: 10.4 via INTRAVENOUS

## 2015-01-13 MED ORDER — REGADENOSON 0.4 MG/5ML IV SOLN
0.4000 mg | Freq: Once | INTRAVENOUS | Status: AC
  Administered 2015-01-13: 0.4 mg via INTRAVENOUS

## 2015-01-13 MED ORDER — AMINOPHYLLINE 25 MG/ML IV SOLN
75.0000 mg | Freq: Once | INTRAVENOUS | Status: AC
  Administered 2015-01-13: 75 mg via INTRAVENOUS

## 2015-01-21 ENCOUNTER — Other Ambulatory Visit: Payer: Self-pay | Admitting: Internal Medicine

## 2015-01-23 NOTE — Telephone Encounter (Signed)
Rx request sent to pharmacy.  

## 2015-04-17 ENCOUNTER — Other Ambulatory Visit: Payer: Self-pay | Admitting: Internal Medicine

## 2015-04-17 NOTE — Telephone Encounter (Signed)
Rx(s) sent to pharmacy electronically.  

## 2015-06-01 ENCOUNTER — Emergency Department (HOSPITAL_COMMUNITY)
Admission: EM | Admit: 2015-06-01 | Discharge: 2015-06-02 | Disposition: A | Discharge status: Home / Self Care | Payer: Medicare Other | Attending: Emergency Medicine | Admitting: Emergency Medicine

## 2015-06-01 ENCOUNTER — Encounter (HOSPITAL_COMMUNITY): Payer: Self-pay | Admitting: *Deleted

## 2015-06-01 DIAGNOSIS — Z9861 Coronary angioplasty status: Secondary | ICD-10-CM | POA: Diagnosis not present

## 2015-06-01 DIAGNOSIS — E785 Hyperlipidemia, unspecified: Secondary | ICD-10-CM | POA: Insufficient documentation

## 2015-06-01 DIAGNOSIS — Z91040 Latex allergy status: Secondary | ICD-10-CM | POA: Diagnosis not present

## 2015-06-01 DIAGNOSIS — Z8619 Personal history of other infectious and parasitic diseases: Secondary | ICD-10-CM | POA: Insufficient documentation

## 2015-06-01 DIAGNOSIS — Z86010 Personal history of colonic polyps: Secondary | ICD-10-CM | POA: Diagnosis not present

## 2015-06-01 DIAGNOSIS — Z79899 Other long term (current) drug therapy: Secondary | ICD-10-CM | POA: Diagnosis not present

## 2015-06-01 DIAGNOSIS — I252 Old myocardial infarction: Secondary | ICD-10-CM | POA: Insufficient documentation

## 2015-06-01 DIAGNOSIS — Z7982 Long term (current) use of aspirin: Secondary | ICD-10-CM | POA: Diagnosis not present

## 2015-06-01 DIAGNOSIS — N189 Chronic kidney disease, unspecified: Secondary | ICD-10-CM | POA: Diagnosis not present

## 2015-06-01 DIAGNOSIS — M1990 Unspecified osteoarthritis, unspecified site: Secondary | ICD-10-CM | POA: Diagnosis not present

## 2015-06-01 DIAGNOSIS — K219 Gastro-esophageal reflux disease without esophagitis: Secondary | ICD-10-CM | POA: Insufficient documentation

## 2015-06-01 DIAGNOSIS — Z862 Personal history of diseases of the blood and blood-forming organs and certain disorders involving the immune mechanism: Secondary | ICD-10-CM | POA: Insufficient documentation

## 2015-06-01 DIAGNOSIS — R011 Cardiac murmur, unspecified: Secondary | ICD-10-CM | POA: Diagnosis not present

## 2015-06-01 DIAGNOSIS — Z7951 Long term (current) use of inhaled steroids: Secondary | ICD-10-CM | POA: Insufficient documentation

## 2015-06-01 DIAGNOSIS — F419 Anxiety disorder, unspecified: Secondary | ICD-10-CM | POA: Insufficient documentation

## 2015-06-01 DIAGNOSIS — G8929 Other chronic pain: Secondary | ICD-10-CM | POA: Insufficient documentation

## 2015-06-01 DIAGNOSIS — I129 Hypertensive chronic kidney disease with stage 1 through stage 4 chronic kidney disease, or unspecified chronic kidney disease: Secondary | ICD-10-CM | POA: Diagnosis not present

## 2015-06-01 DIAGNOSIS — E663 Overweight: Secondary | ICD-10-CM | POA: Diagnosis not present

## 2015-06-01 DIAGNOSIS — R109 Unspecified abdominal pain: Secondary | ICD-10-CM | POA: Insufficient documentation

## 2015-06-01 DIAGNOSIS — R079 Chest pain, unspecified: Secondary | ICD-10-CM | POA: Insufficient documentation

## 2015-06-01 DIAGNOSIS — Z87442 Personal history of urinary calculi: Secondary | ICD-10-CM | POA: Diagnosis not present

## 2015-06-01 DIAGNOSIS — Z9889 Other specified postprocedural states: Secondary | ICD-10-CM | POA: Diagnosis not present

## 2015-06-01 NOTE — ED Notes (Signed)
Pt to ED from home by EMS c/o chest pain onset 2230 tonight with radiation and numbness to bilateral arms. Initial bp 184/86, 324 mg ASA and nitro x 1 given pta with relief. Pt had a cardiac cath in 2011 without stent placement. Denies chest pain at this time. Hx of diverticulosis. Pt denies NV, reports feeling bad throughout the day

## 2015-06-01 NOTE — ED Provider Notes (Signed)
CSN: [id number]     Arrival date & time 06/01/15  2339 History  By signing my name below, I, Arianna Nassar, attest that this documentation has been prepared under the direction and in the presence of Merryl Hacker, MD. Electronically Signed: Julien Nordmann, ED Scribe. 06/02/2015. 1:07 AM.    Chief Complaint  Patient presents with  . Chest Pain     The history is provided by the patient and the EMS personnel. No language interpreter was used.   HPI Comments: Gary [name] is a 82 y.o. male who has a PMHx of HTN, CKD, dyslipidemia, nonSTEMI, and HLD, presents to the Emergency Department complaining of sudden onset, gradual improving, moderate abdominal pain that radiated to his bilateral arms with associated numbness. Pt notes he felt "bad" all day and felt fatigue. Pt says his abdomen began to feel really tight and his bilateral hands began to feel very numb. Pt was sitting down, watching television when he began to have those symptoms. He notes having a hx of NSTEMI in 2011. Per EMS, pt received 324 mg ASA and 1 nitro that gave him minimal relief. She denies chest pain to me. He states "maybe had a little twinge at something but mostly it's my abdomen feels distended." Pt has surgical hx of cholecystectomy. He denies fever, dysuria, hematuria, nausea and vomiting.  Past Medical History  Diagnosis Date  . Hypertension   . Hemorrhoids   . Chronic kidney disease   . Kidney stones   . Dyslipidemia   . Diverticulosis   . Colon polyps   . Blood transfusion   . Anemia   . GI bleeding after 07/2009    "triggered by Plavix & ASA; from my diverticulosis"  . GERD (gastroesophageal reflux disease)   . Anxiety   . Arthritis     "hips and lower back"  . Chronic back pain greater than 3 months duration   . Diarrhea   . Arthritis pain   . Cholecystitis   . Heart attack ([place]) 07/2009    nonSTEMI with an occluded circumflex artery and collaterals.  . Coronary artery disease   . History of  shingles   . Heart murmur   . Neuromuscular disorder ([place])   . Coronary angioplasty status 2011  . Stomach problems   . Lower extremity edema     Taking furosemide and it seems to be helping.  . Nonspecific chest pain 08/26/2012    Went to ED 08/26/12  . Hyperlipidemia   . Chest pain 03/29/11    2D Echo without contrast on 03/29/11 - EF= 55-60%.  . H/O myocardial perfusion scan 04/04/11    For Pre-noncardiac surgery - EF = 67%, no ischemia, and is considered low risk.  . H/O Clostridium difficile infection   . Complication of anesthesia     once slow to wake up  . Adenomatous colon polyp     2010    Past Surgical History  Procedure Laterality Date  . Colon surgery      COLONOSCOPY  . Subtotal colectomy  01/24/2010  . Eye surgery  1942    "right eye was crossed; they  corrected it"  . Posterior fusion lumbar spine  08/1995    "bottom 4 vertebra"  . Back surgery  1997  . Cholecystectomy  04/12/2011    Procedure: CHOLECYSTECTOMY;  Surgeon: Adin Hector, MD;  Location: [place];  Service: General;  Laterality: N/A;  . Cholecystectomy  04/12/2011    Procedure: LAPAROSCOPIC CHOLECYSTECTOMY;  Surgeon: Adin Hector, MD;  Location: [place];  Service: General;  Laterality: N/A;  converted to open  . Lumbar fusion  07/22/2012  . Cardiac catheterization  08/09/2009    Circumflex 100% occluded with right-to-left collaterals, mild irregularities in the proximal and distal LAD and diagonal system, normal LV systolic function, and minor infrarenal irregularities, but no abdominal aortic aneurysm.  . Total hip arthroplasty Right 08/03/2013    Procedure: TOTAL HIP ARTHROPLASTY ANTERIOR APPROACH;  Surgeon: Hessie Dibble, MD;  Location: [place];  Service: Orthopedics;  Laterality: Right;  . Esophagogastroduodenoscopy N/A 08/05/2013    Procedure: ESOPHAGOGASTRODUODENOSCOPY (EGD);  Surgeon: Jerene Bears, MD;  Location: [place];  Service: Gastroenterology;  Laterality: N/A;   Family History  Problem  Relation Age of Onset  . Heart disease Father   . Stroke Father   . Colon cancer Neg Hx   . Pancreatic cancer Mother   . Diverticulosis Sister     x4   Social History  Substance Use Topics  . Smoking status: Never Smoker   . Smokeless tobacco: Never Used  . Alcohol Use: No    Review of Systems  Constitutional: Negative for fever.  Respiratory: Negative for shortness of breath.   Cardiovascular: Negative for chest pain.  Gastrointestinal: Positive for abdominal pain. Negative for nausea and vomiting.  Genitourinary: Negative for dysuria and hematuria.  All other systems reviewed and are negative.     Allergies  Naproxen; Clopidogrel bisulfate; Sulfonamide derivatives; Other; Plavix; Latex; and Tape  Home Medications   Prior to Admission medications   Medication Sig Start Date End Date Taking? Authorizing Provider  ANDROGEL PUMP 20.25 MG/ACT (1.62%) GEL Apply topically daily.  01/15/14  Yes Historical Provider, MD  aspirin EC 81 MG tablet Take 81 mg by mouth daily.   Yes Historical Provider, MD  bisoprolol (ZEBETA) 5 MG tablet TAKE 1/2 TABLETS (2.5 MG TOTAL) BY MOUTH DAILY. 01/23/15  Yes Pixie Casino, MD  citalopram (CELEXA) 40 MG tablet TAKE 1 TABLET EVERY DAY 11/09/13  Yes Pixie Casino, MD  doxazosin (CARDURA) 8 MG tablet Take 4 mg by mouth at bedtime.    Yes Historical Provider, MD  fexofenadine (ALLEGRA) 180 MG tablet Take 180 mg by mouth daily.    Yes Historical Provider, MD  fluticasone (FLONASE) 50 MCG/ACT nasal spray Place 2 sprays into the nose daily.    Yes Historical Provider, MD  furosemide (LASIX) 20 MG tablet TAKE 1 TABLET BY MOUTH DAILY 11/21/14  Yes Pixie Casino, MD  gabapentin (NEURONTIN) 400 MG capsule Take 400 mg by mouth 3 (three) times daily.    Yes Historical Provider, MD  ibuprofen (ADVIL,MOTRIN) 200 MG tablet Take 400 mg by mouth every 6 (six) hours as needed for moderate pain.   Yes Historical Provider, MD  LORazepam (ATIVAN) 0.5 MG tablet Take  0.25 mg by mouth at bedtime.    Yes Historical Provider, MD  nitroGLYCERIN (NITROSTAT) 0.4 MG SL tablet Place 1 tablet (0.4 mg total) under the tongue every 5 (five) minutes as needed for chest pain. x3 doses as needed for chest pain 12/28/13  Yes Pixie Casino, MD  simvastatin (ZOCOR) 40 MG tablet TAKE 1 TABLET BY MOUTH AT BEDTIME 04/17/15  Yes Pixie Casino, MD  Tamsulosin HCl (FLOMAX) 0.4 MG CAPS Take 0.4 mg by mouth daily after supper. 02/04/11  Yes Charlynne Cousins, MD  traMADol-acetaminophen (ULTRACET) 37.5-325 MG per tablet Take 2 tablets by mouth 4 (four) times daily  as needed for moderate pain.  09/23/12  Yes Historical Provider, MD  saccharomyces boulardii (FLORASTOR) 250 MG capsule Take 1 capsule (250 mg total) by mouth 2 (two) times daily. Patient not taking: Reported on 06/02/2015 08/17/13   Annita Brod, MD   Triage vitals: BP 125/53 mmHg  Pulse 56  Temp(Src) 98 F (36.7 C)  Resp 16  Ht 5\' 6"  (1.676 m)  Wt 210 lb (95.255 kg)  BMI 33.91 kg/m2  SpO2 96% Physical Exam  Constitutional: He is oriented to person, place, and time. He appears well-developed and well-nourished. No distress.  Overweight  HENT:  Head: Normocephalic and atraumatic.  Cardiovascular: Normal rate, regular rhythm and normal heart sounds.   No murmur heard. Pulmonary/Chest: Effort normal and breath sounds normal. No respiratory distress. He has no wheezes. He exhibits no tenderness.  Abdominal: Soft. There is no tenderness. There is no rebound and no guarding.  Hyperactive bowel sounds  Musculoskeletal: He exhibits edema.  Trace bilateral lower extremity edema  Lymphadenopathy:    He has no cervical adenopathy.  Neurological: He is alert and oriented to person, place, and time.  Skin: Skin is warm and dry.  Psychiatric: He has a normal mood and affect.  Nursing note and vitals reviewed.   ED Course  Procedures  DIAGNOSTIC STUDIES: Oxygen Saturation is 96% on RA, adequate by my  interpretation.  COORDINATION OF CARE: 1:07 AM Discussed treatment plan which includes lab work with pt at bedside and pt agreed to plan.  Labs Review Labs Reviewed  BASIC METABOLIC PANEL - Abnormal; Notable for the following:    Glucose, Bld 145 (*)    BUN 23 (*)    Creatinine, Ser 1.84 (*)    GFR calc non Af Amer 33 (*)    GFR calc Af Amer 38 (*)    All other components within normal limits  CBC - Abnormal; Notable for the following:    RBC 4.08 (*)    Hemoglobin 12.0 (*)    HCT 36.6 (*)    Platelets 121 (*)    All other components within normal limits  HEPATIC FUNCTION PANEL - Abnormal; Notable for the following:    Total Protein 5.9 (*)    Alkaline Phosphatase 31 (*)    All other components within normal limits  I-STAT CG4 LACTIC ACID, ED - Abnormal; Notable for the following:    Lactic Acid, Venous 2.09 (*)    All other components within normal limits  LIPASE, BLOOD  URINALYSIS, ROUTINE W REFLEX MICROSCOPIC (NOT AT [facility])  I-STAT TROPOININ, ED  I-STAT CG4 LACTIC ACID, ED  Randolm Idol, ED   Imaging Review Dg Chest 2 View  06/02/2015  CLINICAL DATA:  Chest pain onset tonight with radiation and numbness to both arms. Elevated blood pressure. EXAM: CHEST  2 VIEW COMPARISON:  08/14/1998 50 FINDINGS: Shallow inspiration with atelectasis in the lung bases. Mild cardiac enlargement without significant vascular congestion. No focal airspace disease or consolidation in the lungs. No blunting of costophrenic angles. No pneumothorax. Calcified aorta. Similar appearance to previous study. IMPRESSION: Cardiac enlargement. Shallow inspiration with atelectasis in the lung bases. Electronically Signed   By: Lucienne Capers M.D.   On: 06/02/2015 01:07   Ct Abdomen Pelvis W Contrast  06/02/2015  CLINICAL DATA:  Left-sided abdominal pain. EXAM: CT ABDOMEN AND PELVIS WITH CONTRAST TECHNIQUE: Multidetector CT imaging of the abdomen and pelvis was performed using the standard protocol  following bolus administration of intravenous contrast. CONTRAST:  1 ISOVUE-300  IOPAMIDOL (ISOVUE-300) INJECTION 61% COMPARISON:  08/05/2013 FINDINGS: Linear atelectasis in the lung bases. Coronary artery calcifications. Surgical absence of the gallbladder. No bile duct dilatation. The liver, spleen, pancreas, adrenal glands, kidneys, abdominal aorta, inferior vena cava, and retroperitoneal lymph nodes are unremarkable. The stomach, small bowel, and colon are not abnormally distended. No free air or free fluid in the abdomen. Prominent visceral adipose tissues. Pelvis: Subtotal colectomy with ileal sigmoid colonic anastomosis. Bladder is decompressed. Prostate gland is enlarged. No free or loculated pelvic fluid collections. No pelvic mass or lymphadenopathy. Postoperative and degenerative changes in the spine. Right hip arthroplasty. Degenerative changes in the left hip. IMPRESSION: No acute process demonstrated in the abdomen and pelvis to explain history of left abdominal pain. Electronically Signed   By: Lucienne Capers M.D.   On: 06/02/2015 04:23   I have personally reviewed and evaluated these images and lab results as part of my medical decision-making.   EKG Interpretation   Date/Time:  Thursday June 01 2015 23:46:08 EDT Ventricular Rate:  54 PR Interval:  168 QRS Duration: 94 QT Interval:  439 QTC Calculation: 416 R Axis:   -27 Text Interpretation:  Sinus rhythm Borderline left axis deviation Baseline  wander in lead(s) V3 Confirmed by HORTON  MD, Loma Sousa ([id number]) on  06/02/2015 12:18:48 AM Also confirmed by Dina Rich  MD, COURTNEY ([id number])  on  06/02/2015 5:15:15 AM      MDM   Final diagnoses:  Abdominal discomfort    Patient presents with abdominal discomfort. Reports generalized fatigue and not feeling well throughout the day. Also may be a self-limited episode of chest pain. He's currently nontoxic. He is currently without complaint. Physical exam is largely reassuring. EKG shows  no evidence of acute ischemia. Initial troponin is negative. Basic labwork is at the patient's baseline. CT scan did obtained of the abdomen to evaluate for early SBO. History of multiple abdominal surgeries. CT scan is reassuring. Patient has remained nontoxic and without complaint on the emergency room. Repeat troponin continues to be negative. Unclear etiology of the patient's symptoms; however, they seem to have resolved. Doubt ACS. Patient did have a stress test in December 2016 was reassuring. I discussed the results at length with the patient and his family. Follow-up closely with primary physician and cardiologist.  After history, exam, and medical workup I feel the patient has been appropriately medically screened and is safe for discharge home. Pertinent diagnoses were discussed with the patient. Patient was given return precautions.  I personally performed the services described in this documentation, which was scribed in my presence. The recorded information has been reviewed and is accurate.   Merryl Hacker, MD 06/02/15 [phone]

## 2015-06-02 ENCOUNTER — Emergency Department (HOSPITAL_COMMUNITY): Payer: Medicare Other

## 2015-06-02 ENCOUNTER — Encounter (HOSPITAL_COMMUNITY): Payer: Self-pay | Admitting: Radiology

## 2015-06-02 LAB — URINALYSIS, ROUTINE W REFLEX MICROSCOPIC
BILIRUBIN URINE: NEGATIVE
Glucose, UA: NEGATIVE mg/dL
HGB URINE DIPSTICK: NEGATIVE
KETONES UR: NEGATIVE mg/dL
Leukocytes, UA: NEGATIVE
Nitrite: NEGATIVE
Protein, ur: NEGATIVE mg/dL
SPECIFIC GRAVITY, URINE: 1.025 (ref 1.005–1.030)
pH: 5.5 (ref 5.0–8.0)

## 2015-06-02 LAB — BASIC METABOLIC PANEL
Anion gap: 9 (ref 5–15)
BUN: 23 mg/dL — AB (ref 6–20)
CALCIUM: 9 mg/dL (ref 8.9–10.3)
CO2: 26 mmol/L (ref 22–32)
CREATININE: 1.84 mg/dL — AB (ref 0.61–1.24)
Chloride: 106 mmol/L (ref 101–111)
GFR, EST AFRICAN AMERICAN: 38 mL/min — AB (ref >60)
GFR, EST NON AFRICAN AMERICAN: 33 mL/min — AB (ref >60)
Glucose, Bld: 145 mg/dL — ABNORMAL HIGH (ref 65–99)
Potassium: 4.3 mmol/L (ref 3.5–5.1)
SODIUM: 141 mmol/L (ref 135–145)

## 2015-06-02 LAB — CBC
HCT: 36.6 % — ABNORMAL LOW (ref 39.0–52.0)
Hemoglobin: 12 g/dL — ABNORMAL LOW (ref 13.0–17.0)
MCH: 29.4 pg (ref 26.0–34.0)
MCHC: 32.8 g/dL (ref 30.0–36.0)
MCV: 89.7 fL (ref 78.0–100.0)
PLATELETS: 121 10*3/uL — AB (ref 150–400)
RBC: 4.08 MIL/uL — AB (ref 4.22–5.81)
RDW: 14 % (ref 11.5–15.5)
WBC: 5.6 10*3/uL (ref 4.0–10.5)

## 2015-06-02 LAB — I-STAT TROPONIN, ED
TROPONIN I, POC: 0.01 ng/mL (ref 0.00–0.08)
Troponin i, poc: 0.03 ng/mL (ref 0.00–0.08)

## 2015-06-02 LAB — HEPATIC FUNCTION PANEL
ALBUMIN: 3.6 g/dL (ref 3.5–5.0)
ALK PHOS: 31 U/L — AB (ref 38–126)
ALT: 17 U/L (ref 17–63)
AST: 24 U/L (ref 15–41)
BILIRUBIN INDIRECT: 0.4 mg/dL (ref 0.3–0.9)
Bilirubin, Direct: 0.2 mg/dL (ref 0.1–0.5)
TOTAL PROTEIN: 5.9 g/dL — AB (ref 6.5–8.1)
Total Bilirubin: 0.6 mg/dL (ref 0.3–1.2)

## 2015-06-02 LAB — LIPASE, BLOOD: LIPASE: 39 U/L (ref 11–51)

## 2015-06-02 LAB — I-STAT CG4 LACTIC ACID, ED
LACTIC ACID, VENOUS: 2.09 mmol/L — AB (ref 0.5–2.0)
LACTIC ACID, VENOUS: 0.77 mmol/L (ref 0.5–2.0)

## 2015-06-02 MED ORDER — IOPAMIDOL (ISOVUE-300) INJECTION 61%
INTRAVENOUS | Status: AC
  Administered 2015-06-02: 80 mL
  Filled 2015-06-02: qty 100

## 2015-06-02 NOTE — Discharge Instructions (Signed)
You were seen today for abdominal discomfort and generalized fatigue. Your workup is reassuring. This includes cardiac workup. You need to follow-up with her primary physician as well as her cardiologist as soon as possible.  If you have recurrence of symptoms, worsening or any new symptoms, you should be reevaluated immediately.  Abdominal Pain, Adult Many things can cause abdominal pain. Usually, abdominal pain is not caused by a disease and will improve without treatment. It can often be observed and treated at home. Your health care provider will do a physical exam and possibly order blood tests and X-rays to help determine the seriousness of your pain. However, in many cases, more time must pass before a clear cause of the pain can be found. Before that point, your health care provider may not know if you need more testing or further treatment. HOME CARE INSTRUCTIONS Monitor your abdominal pain for any changes. The following actions may help to alleviate any discomfort you are experiencing:  Only take over-the-counter or prescription medicines as directed by your health care provider.  Do not take laxatives unless directed to do so by your health care provider.  Try a clear liquid diet (broth, tea, or water) as directed by your health care provider. Slowly move to a bland diet as tolerated. SEEK MEDICAL CARE IF:  You have unexplained abdominal pain.  You have abdominal pain associated with nausea or diarrhea.  You have pain when you urinate or have a bowel movement.  You experience abdominal pain that wakes you in the night.  You have abdominal pain that is worsened or improved by eating food.  You have abdominal pain that is worsened with eating fatty foods.  You have a fever. SEEK IMMEDIATE MEDICAL CARE IF:  Your pain does not go away within 2 hours.  You keep throwing up (vomiting).  Your pain is felt only in portions of the abdomen, such as the right side or the left lower  portion of the abdomen.  You pass bloody or black tarry stools. MAKE SURE YOU:  Understand these instructions.  Will watch your condition.  Will get help right away if you are not doing well or get worse.   This information is not intended to replace advice given to you by your health care provider. Make sure you discuss any questions you have with your health care provider.   Document Released: 11/07/2004 Document Revised: 10/19/2014 Document Reviewed: 10/07/2012 Elsevier Interactive Patient Education [employer].

## 2015-06-27 ENCOUNTER — Other Ambulatory Visit: Payer: Self-pay | Admitting: Dermatology

## 2015-07-04 ENCOUNTER — Other Ambulatory Visit: Payer: Self-pay | Admitting: Internal Medicine

## 2015-07-04 NOTE — Telephone Encounter (Signed)
Rx(s) sent to pharmacy electronically.  

## 2015-09-20 ENCOUNTER — Ambulatory Visit: Payer: Medicare Other | Admitting: Internal Medicine

## 2015-10-31 ENCOUNTER — Ambulatory Visit (INDEPENDENT_AMBULATORY_CARE_PROVIDER_SITE_OTHER): Payer: Medicare Other | Admitting: Internal Medicine

## 2015-10-31 ENCOUNTER — Encounter: Payer: Self-pay | Admitting: Internal Medicine

## 2015-10-31 VITALS — BP 138/70 | HR 81 | Ht 66.0 in | Wt 210.0 lb

## 2015-10-31 DIAGNOSIS — I252 Old myocardial infarction: Secondary | ICD-10-CM | POA: Diagnosis not present

## 2015-10-31 DIAGNOSIS — I480 Paroxysmal atrial fibrillation: Secondary | ICD-10-CM | POA: Diagnosis not present

## 2015-10-31 DIAGNOSIS — E785 Hyperlipidemia, unspecified: Secondary | ICD-10-CM

## 2015-10-31 DIAGNOSIS — I2510 Atherosclerotic heart disease of native coronary artery without angina pectoris: Secondary | ICD-10-CM | POA: Diagnosis not present

## 2015-10-31 DIAGNOSIS — I10 Essential (primary) hypertension: Secondary | ICD-10-CM

## 2015-10-31 NOTE — Patient Instructions (Signed)
Medication Instructions:  Your physician recommends that you continue on your current medications as directed. Please refer to the Current Medication list given to you today.  Labwork: None Ordered  Testing/Procedures: None Ordered  Follow-Up: Your physician wants you to follow-up in: 12 MONTHS with DR HILTY. You will receive a reminder letter in the mail two months in advance. If you don't receive a letter, please call our office to schedule the follow-up appointment.  Any Other Special Instructions Will Be Listed Below (If Applicable).     If you need a refill on your cardiac medications before your next appointment, please call your pharmacy.

## 2015-10-31 NOTE — Progress Notes (Addendum)
OFFICE NOTE  Chief Complaint:  Left hip pain, anorgasmia, memory problems, depression  Primary Care Physician: [name], MD  HPI:  Gary [name] is a 82 year old gentleman who has a history of coronary disease, was previously followed by Dr. Rex Kras. In 2011 he had a nonSTEMI with an occluded circumflex artery and collaterals. Therefore, he has been treated medically. He has done pretty well with that. He did have chest pain in 2013 and ended up having gallbladder disease and had that removed in March 2013. From time to time, he complains of some fatigue and has been intolerant of Bystolic, was switched to bisoprolol, seems to be tolerating that pretty well. There is also concern about some lower extremity edema which he has at the sock line but does take furosemide and seems to be helping with that. Blood pressure seems to be pretty well controlled and he does do some exercise a couple times a week and is currently denying any chest pain, worsening shortness of breath, palpitations, presyncope or syncopal symptoms. He underwent back surgery this past year for back pain in June by Dr. Belinda Block and still reports problems with sciatic type nerve pain down the right leg. He currently was in the courtesy room in July with chest pain, ruled out for MI by troponins.  Chest pain at that time was felt to be atypical. He has not had recurrence and did not have any complications with his surgery. He currently denies any chest pain or shortness of breath with exertion. He did inquire as to whether it was safe to continue to take AndroGel, which should be okay for him.  His only other concern is some positional dizziness at night.  Gary [name] returns today for follow-up. He is complaining of left hip pain is been undergoing some injections. He is considering left hip replacement. He seems to have done fairly well from the right hip surgery. He does report some mild shortness of breath with exertion but  is not as active as he could be. Again he has known coronary disease with an occluded circumflex and collateral vessels found by catheterization in 2011. He has not had recurrent testing since that time. He is also reporting difficulty reaching climax during sex. He has a history of low testosterone on medication and previously was seeing Dr. Gaynelle Arabian.  10/31/2015   Gary [name] returns today for follow-up. He reports that he ultimately did not undergo hip surgery because of concern for recovery. He reported his orthopedist was concerned about doing surgery because he had an extended hospital course secondary to C. difficile colitis which complicated his hip surgery in the past. He is similar problems again today including problems with short-term memory. He reports intermittent Unk Lightning with depression and is not sure that the Celexa he is taking his helping him much. He also reports anorgasmia. As mentioned before is a history of low testosterone. This may be related to medications, polypharmacy or depression. From a cardiac standpoint he currently denies any chest pain. He did have upper abdominal pain which was reported as chest pain and visited the emergency department April of this year. Extensive workup was negative. Stress testing last fall indicated no ischemia and a preserved EF with small area of scar which is known.  PMHx:  Past Medical History:  Diagnosis Date  . Adenomatous colon polyp    2010   . Anemia   . Anxiety   . Arthritis    "hips and lower back"  .  Arthritis pain   . Blood transfusion   . Chest pain 03/29/11   2D Echo without contrast on 03/29/11 - EF= 55-60%.  . Cholecystitis   . Chronic back pain greater than 3 months duration   . Chronic kidney disease   . Colon polyps   . Complication of anesthesia    once slow to wake up  . Coronary angioplasty status 2011  . Coronary artery disease   . Diarrhea   . Diverticulosis   . Dyslipidemia   . GERD (gastroesophageal  reflux disease)   . GI bleeding after 07/2009   "triggered by Plavix & ASA; from my diverticulosis"  . H/O Clostridium difficile infection   . H/O myocardial perfusion scan 04/04/11   For Pre-noncardiac surgery - EF = 67%, no ischemia, and is considered low risk.  Marland Kitchen Heart attack ([place]) 07/2009   nonSTEMI with an occluded circumflex artery and collaterals.  Marland Kitchen Heart murmur   . Hemorrhoids   . History of shingles   . Hyperlipidemia   . Hypertension   . Kidney stones   . Lower extremity edema    Taking furosemide and it seems to be helping.  . Neuromuscular disorder ([place])   . Nonspecific chest pain 08/26/2012   Went to ED 08/26/12  . Stomach problems     Past Surgical History:  Procedure Laterality Date  . BACK SURGERY  1997  . CARDIAC CATHETERIZATION  08/09/2009   Circumflex 100% occluded with right-to-left collaterals, mild irregularities in the proximal and distal LAD and diagonal system, normal LV systolic function, and minor infrarenal irregularities, but no abdominal aortic aneurysm.  . CHOLECYSTECTOMY  04/12/2011   Procedure: CHOLECYSTECTOMY;  Surgeon: Adin Hector, MD;  Location: [facility];  Service: General;  Laterality: N/A;  . CHOLECYSTECTOMY  04/12/2011   Procedure: LAPAROSCOPIC CHOLECYSTECTOMY;  Surgeon: Adin Hector, MD;  Location: [facility];  Service: General;  Laterality: N/A;  converted to open  . COLON SURGERY     COLONOSCOPY  . ESOPHAGOGASTRODUODENOSCOPY N/A 08/05/2013   Procedure: ESOPHAGOGASTRODUODENOSCOPY (EGD);  Surgeon: Jerene Bears, MD;  Location: [facility];  Service: Gastroenterology;  Laterality: N/A;  . EYE SURGERY  1942   "right eye was crossed; they  corrected it"  . LUMBAR FUSION  07/22/2012  . POSTERIOR FUSION LUMBAR SPINE  08/1995   "bottom 4 vertebra"  . SUBTOTAL COLECTOMY  01/24/2010  . TOTAL HIP ARTHROPLASTY Right 08/03/2013   Procedure: TOTAL HIP ARTHROPLASTY ANTERIOR APPROACH;  Surgeon: Hessie Dibble, MD;  Location: [facility];  Service: Orthopedics;   Laterality: Right;    FAMHx:  Family History  Problem Relation Age of Onset  . Heart disease Father   . Stroke Father   . Pancreatic cancer Mother   . Diverticulosis Sister     x4  . Colon cancer Neg Hx     SOCHx:   reports that he has never smoked. He has never used smokeless tobacco. He reports that he does not drink alcohol or use drugs.  ALLERGIES:  Allergies  Allergen Reactions  . Naproxen Rash  . Clopidogrel Bisulfate Other (See Comments)    H/O diverticulitis; prone to rectal bleeding.  Plavix complicated this.  jkl  . Sulfonamide Derivatives Hives  . Other Other (See Comments)    Narcotics-C. Diff  . Plavix [Clopidogrel Bisulfate]   . Latex Rash  . Tape Itching and Rash    Latex-Adhesive tape    ROS: Pertinent items noted in HPI and remainder of comprehensive ROS  otherwise negative.  HOME MEDS: Current Outpatient Prescriptions  Medication Sig Dispense Refill  . ANDROGEL PUMP 20.25 MG/ACT (1.62%) GEL Apply topically daily.     Marland Kitchen aspirin EC 81 MG tablet Take 81 mg by mouth daily.    . bisoprolol (ZEBETA) 5 MG tablet TAKE 1/2 TABLETS (2.5 MG TOTAL) BY MOUTH DAILY. 45 tablet 3  . citalopram (CELEXA) 40 MG tablet TAKE 1 TABLET EVERY DAY 90 tablet 2  . doxazosin (CARDURA) 8 MG tablet Take 4 mg by mouth at bedtime.     . fexofenadine (ALLEGRA) 180 MG tablet Take 180 mg by mouth daily.     . fluticasone (FLONASE) 50 MCG/ACT nasal spray Place 2 sprays into the nose daily.     . furosemide (LASIX) 20 MG tablet TAKE 1 TABLET BY MOUTH DAILY 90 tablet 0  . gabapentin (NEURONTIN) 400 MG capsule Take 400 mg by mouth 3 (three) times daily.     Marland Kitchen ibuprofen (ADVIL,MOTRIN) 200 MG tablet Take 400 mg by mouth every 6 (six) hours as needed for moderate pain.    Marland Kitchen LORazepam (ATIVAN) 0.5 MG tablet Take 0.25 mg by mouth at bedtime.     Marland Kitchen NITROSTAT 0.4 MG SL tablet PLACE 1 TABLET UNDER THE TONGUE EVERY 5 MINUTES AS NEEDED FOR CHEST PAIN FOR 3 DOSES 25 tablet 2  . saccharomyces  boulardii (FLORASTOR) 250 MG capsule Take 1 capsule (250 mg total) by mouth 2 (two) times daily. 60 capsule 0  . simvastatin (ZOCOR) 40 MG tablet TAKE 1 TABLET BY MOUTH AT BEDTIME 90 tablet 2  . Tamsulosin HCl (FLOMAX) 0.4 MG CAPS Take 0.4 mg by mouth daily after supper.    . traMADol-acetaminophen (ULTRACET) 37.5-325 MG per tablet Take 2 tablets by mouth 4 (four) times daily as needed for moderate pain.      No current facility-administered medications for this visit.     LABS/IMAGING: No results found for this or any previous visit (from the past 48 hour(s)). No results found.  VITALS: BP 138/70   Pulse 81   Ht 5\' 6"  (1.676 m)   Wt 210 lb (95.3 kg)   BMI 33.89 kg/m   EXAM: General appearance: alert and no distress Neck: no carotid bruit and no JVD Lungs: clear to auscultation bilaterally Heart: regular rate and rhythm, S1, S2 normal, no murmur, click, rub or gallop Abdomen: soft, non-tender; bowel sounds normal; no masses,  no organomegaly Extremities: extremities normal, atraumatic, no cyanosis or edema Pulses: 2+ and symmetric Skin: Skin color, texture, turgor normal. No rashes or lesions Neurologic: Grossly normal Psych: Mood, affect normal  EKG: Normal sinus rhythm at 81  ASSESSMENT: 1. Coronary artery disease status post occluded circumflex in 2011 with collaterals 2. Hypertension-controlled 3. Dyslipidemia-at goal 4. Legs/back pain - s/p R THR, now contemplating left hip replacement 5. Indeterminate risk for hip surgery 6. ?PAF - related to sepsis, less than 5 minutes duration 7. Depression 8. Anorgasmia 9. Short-term memory loss  PLAN: 1.   Gary [name] has had no significant cardiac complaints over the past year. Workup for atypical chest pain which was more abdominal pain in April was negative. Stress testing last year was negative for ischemia. There is a question of PAF in the past. I looked at the chart and indicated hospitalization back in 2015 when he had  infection and he was noted to have less than 5 minutes of PAF which resolved spontaneously. This was questionable on his EKG. He's not had any recurrence that  I'm aware of and is not being treated as such. He continues to have hip pain but prefers to manage it medically at this time. I think he struggling with depression and memory loss which I will defer to his primary care provider. He may need adjustments in his antidepressant medication or perhaps referral to neurology for short-term memory loss. He does see a urologist for his anorgasmia, and may need to follow-up there.  F/u with me annually or sooner as needed. He has an upcoming appointment with his PCP-I will defer a repeat lipid profile to Dr. Nyoka Cowden if he could obtain that and send the results to me I would appreciate it.  Pixie Casino, MD, [facility] Attending Cardiologist [facility] C Hilty 10/31/2015, 8:43 AM

## 2016-01-15 ENCOUNTER — Other Ambulatory Visit: Payer: Self-pay | Admitting: Dermatology

## 2016-03-17 ENCOUNTER — Other Ambulatory Visit: Payer: Self-pay | Admitting: Internal Medicine

## 2016-03-18 NOTE — Telephone Encounter (Signed)
Rx(s) sent to pharmacy electronically.  

## 2016-05-14 ENCOUNTER — Ambulatory Visit (INDEPENDENT_AMBULATORY_CARE_PROVIDER_SITE_OTHER): Payer: Medicare Other | Admitting: Internal Medicine

## 2016-05-14 ENCOUNTER — Encounter: Payer: Self-pay | Admitting: Internal Medicine

## 2016-05-14 VITALS — BP 157/70 | HR 68 | Ht 66.0 in | Wt 208.6 lb

## 2016-05-14 DIAGNOSIS — R55 Syncope and collapse: Secondary | ICD-10-CM

## 2016-05-14 DIAGNOSIS — I480 Paroxysmal atrial fibrillation: Secondary | ICD-10-CM

## 2016-05-14 DIAGNOSIS — R011 Cardiac murmur, unspecified: Secondary | ICD-10-CM

## 2016-05-14 MED ORDER — FUROSEMIDE 20 MG PO TABS: 20.0000 mg | ORAL_TABLET | Freq: Every day | ORAL | 1 refills | Status: DC | PRN

## 2016-05-14 NOTE — Patient Instructions (Addendum)
Medication Instructions: you may take your lasix daily as needed.   Labwork: none   Testing/Procedures: Your physician has requested that you have an echocardiogram. Echocardiography is a painless test that uses sound waves to create images of your heart. It provides your doctor with information about the size and shape of your heart and how well your heart's chambers and valves are working. This procedure takes approximately one hour. There are no restrictions for this procedure.     Follow-Up: 6 months with Dr. Debara Pickett   If you need a refill on your cardiac medications before your next appointment, please call your pharmacy.

## 2016-05-14 NOTE — Progress Notes (Signed)
OFFICE NOTE  Chief Complaint:  No complaints  Primary Care Physician: [name], MD  HPI:  Gary [name] is a 83 year old gentleman who has a history of coronary disease, was previously followed by Dr. Rex Kras. In 2011 he had a nonSTEMI with an occluded circumflex artery and collaterals. Therefore, he has been treated medically. He has done pretty well with that. He did have chest pain in 2013 and ended up having gallbladder disease and had that removed in March 2013. From time to time, he complains of some fatigue and has been intolerant of Bystolic, was switched to bisoprolol, seems to be tolerating that pretty well. There is also concern about some lower extremity edema which he has at the sock line but does take furosemide and seems to be helping with that. Blood pressure seems to be pretty well controlled and he does do some exercise a couple times a week and is currently denying any chest pain, worsening shortness of breath, palpitations, presyncope or syncopal symptoms. He underwent back surgery this past year for back pain in June by Dr. Belinda Block and still reports problems with sciatic type nerve pain down the right leg. He currently was in the courtesy room in July with chest pain, ruled out for MI by troponins.  Chest pain at that time was felt to be atypical. He has not had recurrence and did not have any complications with his surgery. He currently denies any chest pain or shortness of breath with exertion. He did inquire as to whether it was safe to continue to take AndroGel, which should be okay for him.  His only other concern is some positional dizziness at night.  Gary [name] returns today for follow-up. He is complaining of left hip pain is been undergoing some injections. He is considering left hip replacement. He seems to have done fairly well from the right hip surgery. He does report some mild shortness of breath with exertion but is not as active as he could be. Again  he has known coronary disease with an occluded circumflex and collateral vessels found by catheterization in 2011. He has not had recurrent testing since that time. He is also reporting difficulty reaching climax during sex. He has a history of low testosterone on medication and previously was seeing Dr. Gaynelle Arabian.  10/31/2015   Gary [name] returns today for follow-up. He reports that he ultimately did not undergo hip surgery because of concern for recovery. He reported his orthopedist was concerned about doing surgery because he had an extended hospital course secondary to C. difficile colitis which complicated his hip surgery in the past. He is similar problems again today including problems with short-term memory. He reports intermittent Unk Lightning with depression and is not sure that the Celexa he is taking his helping him much. He also reports anorgasmia. As mentioned before is a history of low testosterone. This may be related to medications, polypharmacy or depression. From a cardiac standpoint he currently denies any chest pain. He did have upper abdominal pain which was reported as chest pain and visited the emergency department April of this year. Extensive workup was negative. Stress testing last fall indicated no ischemia and a preserved EF with small area of scar which is known.  05/14/2016  Gary [name] was seen today in follow-up. Recently he underwent some cataract surgery. He denies any chest pain or worsenhoseing shortness of breath. He had what sounds like a presyncopal episode. He was standing for about 20 minutes in  one position talking to his neighbor holding a leaf blower. Apparently he became a little dizzy or presyncopal and fell to his left side striking his left pectoralis area on a car meter. He remembers the event. He did not fall to the ground. He's had no further events. There was no associated chest pain or heart racing. He does have a bruise over the left pectoralis muscle. He takes  Lasix every day for unknown reasons and has no significant swelling.  PMHx:  Past Medical History:  Diagnosis Date  . Adenomatous colon polyp    2010   . Anemia   . Anxiety   . Arthritis    "hips and lower back"  . Arthritis pain   . Blood transfusion   . Chest pain 03/29/11   2D Echo without contrast on 03/29/11 - EF= 55-60%.  . Cholecystitis   . Chronic back pain greater than 3 months duration   . Chronic kidney disease   . Colon polyps   . Complication of anesthesia    once slow to wake up  . Coronary angioplasty status 2011  . Coronary artery disease   . Diarrhea   . Diverticulosis   . Dyslipidemia   . GERD (gastroesophageal reflux disease)   . GI bleeding after 07/2009   "triggered by Plavix & ASA; from my diverticulosis"  . H/O Clostridium difficile infection   . H/O myocardial perfusion scan 04/04/11   For Pre-noncardiac surgery - EF = 67%, no ischemia, and is considered low risk.  Marland Kitchen Heart attack 07/2009   nonSTEMI with an occluded circumflex artery and collaterals.  Marland Kitchen Heart murmur   . Hemorrhoids   . History of shingles   . Hyperlipidemia   . Hypertension   . Kidney stones   . Lower extremity edema    Taking furosemide and it seems to be helping.  . Neuromuscular disorder ([place])   . Nonspecific chest pain 08/26/2012   Went to ED 08/26/12  . Stomach problems     Past Surgical History:  Procedure Laterality Date  . BACK SURGERY  1997  . CARDIAC CATHETERIZATION  08/09/2009   Circumflex 100% occluded with right-to-left collaterals, mild irregularities in the proximal and distal LAD and diagonal system, normal LV systolic function, and minor infrarenal irregularities, but no abdominal aortic aneurysm.  . CHOLECYSTECTOMY  04/12/2011   Procedure: CHOLECYSTECTOMY;  Surgeon: Adin Hector, MD;  Location: [facility];  Service: General;  Laterality: N/A;  . CHOLECYSTECTOMY  04/12/2011   Procedure: LAPAROSCOPIC CHOLECYSTECTOMY;  Surgeon: Adin Hector, MD;  Location: [facility];   Service: General;  Laterality: N/A;  converted to open  . COLON SURGERY     COLONOSCOPY  . ESOPHAGOGASTRODUODENOSCOPY N/A 08/05/2013   Procedure: ESOPHAGOGASTRODUODENOSCOPY (EGD);  Surgeon: Jerene Bears, MD;  Location: [facility];  Service: Gastroenterology;  Laterality: N/A;  . EYE SURGERY  1942   "right eye was crossed; they  corrected it"  . LUMBAR FUSION  07/22/2012  . POSTERIOR FUSION LUMBAR SPINE  08/1995   "bottom 4 vertebra"  . SUBTOTAL COLECTOMY  01/24/2010  . TOTAL HIP ARTHROPLASTY Right 08/03/2013   Procedure: TOTAL HIP ARTHROPLASTY ANTERIOR APPROACH;  Surgeon: Hessie Dibble, MD;  Location: [place];  Service: Orthopedics;  Laterality: Right;    FAMHx:  Family History  Problem Relation Age of Onset  . Heart disease Father   . Stroke Father   . Pancreatic cancer Mother   . Diverticulosis Sister     x4  .  Colon cancer Neg Hx     SOCHx:   reports that he has never smoked. He has never used smokeless tobacco. He reports that he does not drink alcohol or use drugs.  ALLERGIES:  Allergies  Allergen Reactions  . Naproxen Rash  . Clopidogrel Bisulfate Other (See Comments)    H/O diverticulitis; prone to rectal bleeding.  Plavix complicated this.  jkl  . Sulfonamide Derivatives Hives  . Other Other (See Comments)    Narcotics-C. Diff  . Plavix [Clopidogrel Bisulfate]   . Latex Rash  . Tape Itching and Rash    Latex-Adhesive tape    ROS: Pertinent items noted in HPI and remainder of comprehensive ROS otherwise negative.  HOME MEDS: Current Outpatient Prescriptions  Medication Sig Dispense Refill  . ANDROGEL PUMP 20.25 MG/ACT (1.62%) GEL Apply topically daily.     Marland Kitchen aspirin EC 81 MG tablet Take 81 mg by mouth daily.    . bisoprolol (ZEBETA) 5 MG tablet TAKE 1/2 TABLETS (2.5 MG TOTAL) BY MOUTH DAILY. 45 tablet 3  . citalopram (CELEXA) 40 MG tablet TAKE 1 TABLET EVERY DAY 90 tablet 2  . doxazosin (CARDURA) 8 MG tablet Take 4 mg by mouth at bedtime.     .  fexofenadine (ALLEGRA) 180 MG tablet Take 180 mg by mouth daily.     . fluticasone (FLONASE) 50 MCG/ACT nasal spray Place 2 sprays into the nose daily.     . furosemide (LASIX) 20 MG tablet TAKE 1 TABLET BY MOUTH DAILY 90 tablet 0  . gabapentin (NEURONTIN) 400 MG capsule Take 400 mg by mouth 3 (three) times daily.     Marland Kitchen ibuprofen (ADVIL,MOTRIN) 200 MG tablet Take 400 mg by mouth every 6 (six) hours as needed for moderate pain.    Marland Kitchen LORazepam (ATIVAN) 0.5 MG tablet Take 0.25 mg by mouth at bedtime.     Marland Kitchen NITROSTAT 0.4 MG SL tablet PLACE 1 TABLET UNDER THE TONGUE EVERY 5 MINUTES AS NEEDED FOR CHEST PAIN FOR 3 DOSES 25 tablet 2  . saccharomyces boulardii (FLORASTOR) 250 MG capsule Take 1 capsule (250 mg total) by mouth 2 (two) times daily. 60 capsule 0  . simvastatin (ZOCOR) 40 MG tablet TAKE 1 TABLET BY MOUTH AT BEDTIME 90 tablet 2  . Tamsulosin HCl (FLOMAX) 0.4 MG CAPS Take 0.4 mg by mouth daily after supper.    . traMADol-acetaminophen (ULTRACET) 37.5-325 MG per tablet Take 2 tablets by mouth 4 (four) times daily as needed for moderate pain.      No current facility-administered medications for this visit.     LABS/IMAGING: No results found for this or any previous visit (from the past 48 hour(s)). No results found.  VITALS: BP (!) 157/70   Pulse 68   Ht 5\' 6"  (1.676 m)   Wt 208 lb 9.6 oz (94.6 kg)   BMI 33.67 kg/m   EXAM: General appearance: alert and no distress Neck: no carotid bruit and no JVD Lungs: clear to auscultation bilaterally Heart: regular rate and rhythm, S1, S2 normal, no murmur, click, rub or gallop Abdomen: soft, non-tender; bowel sounds normal; no masses,  no organomegaly Extremities: extremities normal, atraumatic, no cyanosis or edema and Bruising over the left pectoralis muscle Pulses: 2+ and symmetric Skin: Skin color, texture, turgor normal. No rashes or lesions Neurologic: Grossly normal Psych: Mood, affect normal  EKG: Sinus rhythm at  68  ASSESSMENT: 1. Near-syncope 2. Coronary artery disease status post occluded circumflex in 2011 with collaterals 3. Hypertension-controlled 4.  Dyslipidemia-at goal 5. Legs/back pain - s/p R THR, now contemplating left hip replacement 6. Indeterminate risk for hip surgery 7. ?PAF - related to sepsis, less than 5 minutes duration 8. Depression 9. Anorgasmia 10. Short-term memory loss  PLAN: 1.   Gary [name] sounds like he had a near syncopal episode. This is likely related to standing still for 20 minutes and being on a number of medications which can be associated with that including alpha blockers for his prostate and he is on daily Lasix. I advised him to take his Lasix only as needed if he has significant swelling that does not improve with elevation in his feet or with compression stockings. We'll also go ahead and recheck an echo as he does have murmur and is known to have normal LV function as of the stress test in 2016. Follow-up 6 months.  Pixie Casino, MD, [facility] Attending Cardiologist [place]

## 2016-05-29 ENCOUNTER — Other Ambulatory Visit (HOSPITAL_COMMUNITY): Payer: Medicare Other

## 2016-06-13 ENCOUNTER — Other Ambulatory Visit: Payer: Self-pay

## 2016-06-13 ENCOUNTER — Ambulatory Visit (HOSPITAL_COMMUNITY): Payer: Medicare Other | Attending: Cardiology

## 2016-06-13 DIAGNOSIS — I252 Old myocardial infarction: Secondary | ICD-10-CM | POA: Insufficient documentation

## 2016-06-13 DIAGNOSIS — I272 Pulmonary hypertension, unspecified: Secondary | ICD-10-CM | POA: Insufficient documentation

## 2016-06-13 DIAGNOSIS — R55 Syncope and collapse: Secondary | ICD-10-CM

## 2016-06-13 DIAGNOSIS — I071 Rheumatic tricuspid insufficiency: Secondary | ICD-10-CM | POA: Insufficient documentation

## 2016-06-13 DIAGNOSIS — R011 Cardiac murmur, unspecified: Secondary | ICD-10-CM | POA: Diagnosis not present

## 2016-06-21 ENCOUNTER — Ambulatory Visit (INDEPENDENT_AMBULATORY_CARE_PROVIDER_SITE_OTHER): Payer: Medicare Other | Admitting: Gastroenterology

## 2016-06-21 ENCOUNTER — Encounter: Payer: Self-pay | Admitting: Gastroenterology

## 2016-06-21 VITALS — BP 156/82 | HR 72 | Ht 65.0 in | Wt 210.5 lb

## 2016-06-21 DIAGNOSIS — R1032 Left lower quadrant pain: Secondary | ICD-10-CM | POA: Diagnosis not present

## 2016-06-21 DIAGNOSIS — K641 Second degree hemorrhoids: Secondary | ICD-10-CM

## 2016-06-21 DIAGNOSIS — K625 Hemorrhage of anus and rectum: Secondary | ICD-10-CM

## 2016-06-21 NOTE — Progress Notes (Signed)
      GI Progress Note  Chief Complaint: Rectal bleeding  Subjective  History:  This is an 83 year old man last seen by Dr. Deatra Ina in December 2015. He last did a colonoscopy in September 2010. Patient had what appears to be 2 sessions of hemorrhoidal banding in late 2015. He has had some intermittent left lower quadrant pain since his surgery for diverticulitis Tajai's here today to discuss his abdominal pain and ongoing hemorrhoid symptoms. He reports intermittent left lower quadrant pressure around the time of bowel movements. His bowel movements are of variable consistency and frequency. He has also had some hemorrhoidal bleeding and feelings of a hemorrhoid protruding the results in some fecal incontinence.  ROS: Cardiovascular:  no chest pain Respiratory: no dyspnea  The patient's Past Medical, Family and Social History were reviewed and are on file in the EMR.  Objective:  Med list reviewed  Vital signs in last 24 hrs: Vitals:   06/21/16 1136  BP: (!) 156/82  Pulse: 72    Physical Exam    HEENT: sclera anicteric, oral mucosa moist without lesions  Neck: supple, no thyromegaly, JVD or lymphadenopathy  Cardiac: RRR without murmurs, S1S2 heard, no peripheral edema  Pulm: clear to auscultation bilaterally, normal RR and effort noted  Abdomen: soft, Overweight, mild LLQ tenderness, with active bowel sounds. No guarding or palpable hepatosplenomegaly.  Skin; warm and dry, no jaundice or rash Rectal: Nothing external, normal sphincter tone, no masses felt. Anoscopy: Large right anterior internal hemorrhoid   @ASSESSMENTPLANBEGIN @ Assessment: Encounter Diagnoses  Name Primary?  . Prolapsed internal hemorrhoids, grade 2 Yes  . Rectal bleeding   . LLQ abdominal pain    I think his pain is benign and I do not feel that a colonoscopy is warranted He appears to have a symptomatic grade 2 right anterior internal hemorrhoid for which he requested  banding.  PROCEDURE NOTE: The patient presents with symptomatic grade 2 hemorrhoids, requesting rubber band ligation of his/her hemorrhoidal disease. All risks, benefits and alternative forms of therapy were described and informed consent was obtained.   The anorectum was pre-medicated with 0.125% NTG and lubricant. The decision was made to band the RA internal hemorrhoids, and the [place] was used to perform band ligation without complication. Digital anorectal examination was then performed to assure proper positioning of the band, and to adjust the banded tissue as required. The patient was discharged home without pain or other issues. Dietary and behavioral recommendations were given and along with follow-up instructions.   The following adjunctive treatments were recommended: none  The patient will return as needed  for follow-up and possible additional banding as required. No complications were encountered and the patient tolerated the procedure well.    Excluding the hemorrhoidal banding procedure, total encounter time 20 minutes, over half spent in counseling and coordination of care.   Nelida Meuse III

## 2016-06-21 NOTE — Patient Instructions (Signed)
If you are age 81 or older, your body mass index should be between 23-30. Your Body mass index is 35.03 kg/m. If this is out of the aforementioned range listed, please consider follow up with your Primary Care Provider.  If you are age 54 or younger, your body mass index should be between 19-25. Your Body mass index is 35.03 kg/m. If this is out of the aformentioned range listed, please consider follow up with your Primary Care Provider.   HEMORRHOID BANDING PROCEDURE    FOLLOW-UP CARE   1. The procedure you have had should have been relatively painless since the banding of the area involved does not have nerve endings and there is no pain sensation.  The rubber band cuts off the blood supply to the hemorrhoid and the band may fall off as soon as 48 hours after the banding (the band may occasionally be seen in the toilet bowl following a bowel movement). You may notice a temporary feeling of fullness in the rectum which should respond adequately to plain Tylenol or Motrin.  2. Following the banding, avoid strenuous exercise that evening and resume full activity the next day.  A sitz bath (soaking in a warm tub) or bidet is soothing, and can be useful for cleansing the area after bowel movements.     3. To avoid constipation, take two tablespoons of natural wheat bran, natural oat bran, flax, Benefiber or any over the counter fiber supplement and increase your water intake to 7-8 glasses daily.    4. Unless you have been prescribed anorectal medication, do not put anything inside your rectum for two weeks: No suppositories, enemas, fingers, etc.  5. Occasionally, you may have more bleeding than usual after the banding procedure.  This is often from the untreated hemorrhoids rather than the treated one.  Don't be concerned if there is a tablespoon or so of blood.  If there is more blood than this, lie flat with your bottom higher than your head and apply an ice pack to the area. If the bleeding  does not stop within a half an hour or if you feel faint, call our office at [phone] or go to the emergency room.  6. Problems are not common; however, if there is a substantial amount of bleeding, severe pain, chills, fever or difficulty passing urine (very rare) or other problems, you should call us at [phone] or report to the nearest emergency room.  7. Do not stay seated continuously for more than 2-3 hours for a day or two after the procedure.  Tighten your buttock muscles 10-15 times every two hours and take 10-15 deep breaths every 1-2 hours.  Do not spend more than a few minutes on the toilet if you cannot empty your bowel; instead re-visit the toilet at a later time.    Thank you for choosing [facility]  Dr Wilfrid Lund III

## 2016-06-24 ENCOUNTER — Ambulatory Visit (INDEPENDENT_AMBULATORY_CARE_PROVIDER_SITE_OTHER): Payer: Medicare Other | Admitting: Physician Assistant

## 2016-06-24 ENCOUNTER — Encounter: Payer: Self-pay | Admitting: Physician Assistant

## 2016-06-24 ENCOUNTER — Telehealth: Payer: Self-pay | Admitting: Gastroenterology

## 2016-06-24 VITALS — BP 154/68 | HR 66 | Ht 65.0 in | Wt 210.0 lb

## 2016-06-24 DIAGNOSIS — K648 Other hemorrhoids: Secondary | ICD-10-CM

## 2016-06-24 DIAGNOSIS — K6289 Other specified diseases of anus and rectum: Secondary | ICD-10-CM

## 2016-06-24 MED ORDER — AMBULATORY NON FORMULARY MEDICATION: 2.0000 mL | Freq: Three times a day (TID) | 0 refills | Status: DC

## 2016-06-24 NOTE — Patient Instructions (Signed)
Hold the baby aspirin for 3 days.  We called in a prescription for Lidocaine  5 % and Nitroglycerin ointment 0.125. These two will be mixed together.  Use this ointment  3-4 times daily until symptoms reslove.   I called them in to [facility] [place] as [employer]. Their number is [phone].

## 2016-06-24 NOTE — Progress Notes (Signed)
Subjective:    Patient ID: Gary [name], male    DOB: 03-06-1932, 83 y.o.   MRN: [id number]  HPI Gary [name] is a pleasant 83 year old white male, just established with Dr. Loletha [name], former patient of Dr. Deatra [name], who was just seen on 06/21/2016 per Dr. Loletha [name] and underwent banding of a large anterior internal hemorrhoid. Patient called earlier today stating that he was having severe pain which woke him up in the middle of the night last night and requested to come in for evaluation. Patient states that he had some discomfort and pressure since the band was placed but felt this was to be expected. He says discomfort was tolerable until last night. He says he felt that the hemorrhoid prolapsed and was very painful. He says he was up in the middle of the night, had a couple of bowel movements which was quite painful did not see any significant amount of blood. He was unable to sleep through the night because of pain. He says he has been taking some Tylenol today and is still hurting though not as badly as last night. For other details of his history please see dictated note from 06/21/2016  Review of Systems Pertinent positive and negative review of systems were noted in the above HPI section.  All other review of systems was otherwise negative.  Outpatient Encounter Prescriptions as of 06/24/2016  Medication Sig  . ANDROGEL PUMP 20.25 MG/ACT (1.62%) GEL Apply topically daily.   Marland Kitchen aspirin EC 81 MG tablet Take 81 mg by mouth daily.  . bisoprolol (ZEBETA) 5 MG tablet TAKE 1/2 TABLETS (2.5 MG TOTAL) BY MOUTH DAILY.  . citalopram (CELEXA) 40 MG tablet TAKE 1 TABLET EVERY DAY  . doxazosin (CARDURA) 8 MG tablet Take 4 mg by mouth at bedtime.   . fluticasone (FLONASE) 50 MCG/ACT nasal spray Place 2 sprays into the nose daily.   . furosemide (LASIX) 20 MG tablet Take 1 tablet (20 mg total) by mouth daily as needed.  . gabapentin (NEURONTIN) 400 MG capsule Take 400 mg by mouth 3 (three) times daily.   Marland Kitchen LORazepam  (ATIVAN) 0.5 MG tablet Take 0.25 mg by mouth at bedtime.   Marland Kitchen NITROSTAT 0.4 MG SL tablet PLACE 1 TABLET UNDER THE TONGUE EVERY 5 MINUTES AS NEEDED FOR CHEST PAIN FOR 3 DOSES  . saccharomyces boulardii (FLORASTOR) 250 MG capsule Take 1 capsule (250 mg total) by mouth 2 (two) times daily.  . simvastatin (ZOCOR) 40 MG tablet TAKE 1 TABLET BY MOUTH AT BEDTIME  . Tamsulosin HCl (FLOMAX) 0.4 MG CAPS Take 0.4 mg by mouth daily after supper.  . traMADol-acetaminophen (ULTRACET) 37.5-325 MG per tablet Take 2 tablets by mouth 4 (four) times daily as needed for moderate pain.   Marland Kitchen AMBULATORY NON FORMULARY MEDICATION Place 2 mLs rectally 3 (three) times daily. Medication Name: 5 % lidocaine ointment mixed with 0.125 nitroglycerin ointment   No facility-administered encounter medications on file as of 06/24/2016.    Allergies  Allergen Reactions  . Naproxen Rash  . Clopidogrel Bisulfate Other (See Comments)    H/O diverticulitis; prone to rectal bleeding.  Plavix complicated this.  jkl  . Sulfonamide Derivatives Hives  . Other Other (See Comments)    Narcotics-C. Diff  . Plavix [Clopidogrel Bisulfate]   . Latex Rash  . Tape Itching and Rash    Latex-Adhesive tape   Patient Active Problem List   Diagnosis Date Noted  . Pre-syncope 05/14/2016  . Murmur 05/14/2016  . CAD in  native artery 10/31/2015  . History of MI (myocardial infarction) 10/31/2015  . Preoperative cardiovascular examination 12/26/2014  . History of colonic polyps 01/17/2014  . PAF (paroxysmal atrial fibrillation) ([place]) 08/14/2013  . Metabolic acidosis 62/56/3893  . Acute renal failure ([place]) 08/13/2013  . Chronic kidney disease, stage 3 08/13/2013  . Anemia in chronic kidney disease 08/13/2013  . Hypokalemia 08/12/2013  . Acute blood loss anemia 08/09/2013  . Encephalopathy 08/09/2013  . Severe sepsis ([place]) 08/08/2013  . Hypotension, unspecified 08/07/2013  . Complex renal cyst 08/07/2013  . Renal mass, left 08/06/2013  .  Upper GI bleeding 08/05/2013  . Acute esophagitis 08/05/2013  . Urinary retention 08/04/2013  . S/P total hip arthroplasty 08/03/2013  . Degenerative joint disease (DJD) of hip 08/03/2013    Class: Chronic  . Internal hemorrhoids with other complication 73/42/8768  . C. difficile colitis 07/26/2012  . Ileus, postoperative ([place]) 07/24/2012  . Somnolence 07/24/2012  . Anxiety state 07/24/2012  . Chest pain- ER 08/26/12 03/30/2011  . Fever 03/28/2011  . Leucocytosis 03/28/2011  . CKD (chronic kidney disease), stage III 03/28/2011  . Hyponatremia 02/02/2011  . BPH (benign prostatic hyperplasia) 02/01/2011  . Cholecystitis chronic, acute 01/26/2011  . Dyslipidemia 01/22/2011  . Abdominal bloating 08/07/2010  . Loose stools 08/07/2010  . Coronary atherosclerosis 10/26/2009  . Essential hypertension 06/07/2009  . DIVERTICULOSIS OF COLON 10/31/2008  . DIVERTICULOSIS, COLON, WITH HEMORRHAGE, surg 12/11  10/31/2008  . Personal history of colonic polyps 10/31/2008   Social History   Social History  . Marital status: Married    Spouse name: N/A  . Number of children: 2  . Years of education: N/A   Occupational History  . Instructor    Social History Main Topics  . Smoking status: Never Smoker  . Smokeless tobacco: Never Used  . Alcohol use No  . Drug use: No  . Sexual activity: Not Currently   Other Topics Concern  . Not on file   Social History Narrative   Daily caffeine use.    Gary [name] family history includes Diverticulosis in his sister; Heart disease in his father; Pancreatic cancer in his mother; Stroke in his father.      Objective:    Vitals:   06/24/16 1432  BP: (!) 154/68  Pulse: 66    Physical Exam  ;Well-developed elderly white male, uncomfortable-appearing standing. Blood pressure 154/68 pulse 66 height 5 foot 5 weight 210. Cardiovascular;egular rate and rhythm with S1-S2 no murmur or gallop, Pulmonary ;clear bilaterally, Abdomen; soft, bowel sounds  are present no palpable mass or hepatosplenomegaly he has some asymmetry to his let lower abdominal wall post midline incision, nontender and no hernia palpable Rectal ;no external lesions or prolapsed hemorrhoid visible, on digital exam he has a large edematous anterior hemorrhoid with band palpable at the proximal tip of the hemorrhoid quite tender to palpation there was some oozing of blood. Rectal exam was also done per Dr. Havery Moros with attempt to manipulate the band, and was loosened a bit but not removed, patient expressed improvement in pain. There was some fresh blood after manipulation.       Assessment & Plan:   #71 83 year old white male with severe rectal pain onset about 48 hours after banding of large anterior internal hemorrhoid. On exam band is still present, this was manipulated and loosened some though band felt to be at the very tip of the hemorrhoid tissue. He did have some bleeding with manipulation but felt improvement in his pain.  Plan; hold baby aspirin for 3 days Continue Tylenol and/or tramadol which he has at home as needed for pain. Start nitroglycerin ointment 0.125%  ,3-4 times daily to internal hemorrhoid until symptoms resolve Start lidocaine 5% ointment 3-4 times daily to internal hemorrhoid until symptoms resolve. Patient is asked to call if he has not had significant improvement in his symptoms in the next 48 hours and/or if he has any worsening of his symptoms. He was advised that should he have any significant bleeding to proceed to the emergency room for evaluation.  Amy S Esterwood PA-C 06/24/2016   Cc: Levin Erp, MD

## 2016-06-24 NOTE — Telephone Encounter (Signed)
Patient had a hemorrhoid banding on Friday, 5/11. Patient had some discomfort that night and Saturday. Patient states that it went from discomfort to pain on Sunday and last evening was severe. He is requesting to be seen as he feels that the area is protruding, experienced very little bleeding, he has had his normal, numerous bm's.

## 2016-06-25 NOTE — Progress Notes (Signed)
Agree with assessment and plan. On exam not much tissue remained trapped within the band. I loosened it and his pain appeared improved, although he did have some bleeding with manipulation. I suspect he has pain from ulceration at the banding site / spasm. Recommend topical nitroglycerin TID for his pain. I think his discomfort will continue to improve and resolve in the upcoming days. If he has any significant bleeding noted otherwise he needs to contact us for reassessment.

## 2016-06-26 ENCOUNTER — Telehealth: Payer: Self-pay | Admitting: Gastroenterology

## 2016-06-26 NOTE — Telephone Encounter (Signed)
Patient and his wife reassured. Bleeding is continuing to stop on its own. He has not had any more aspirin since Monday. They understand that if the bleeding should increase they need to go to the ER, and they will call tomorrow to give an update.

## 2016-06-26 NOTE — Telephone Encounter (Signed)
I am glad the called to let us know.  That bleeding is typical because when the band comes off about now, it leaves an ulcer underneath.  I expect that the bleeding will stop on its own.   If he has been taking aspirin regularly, please advise him not to do so for the next week.  Please ask him to call us with an update tomorrow.

## 2016-06-26 NOTE — Telephone Encounter (Signed)
Spoke to patient, he states that he had rectal bleeding last night where he had to change his pad twice. I reminded him that at his visit on Monday he was told to go to the ER should he have severe bleeding, patient did not think it was that severe. He states that the last pad was placed at 2:00 am, with only a little bit of blood on it. He denies any weakness, SOB, dizziness. Pain is under control with the nitroglycerin ointment. Patient called as he was concerned that he still was having the bleeding.

## 2016-06-27 ENCOUNTER — Telehealth: Payer: Self-pay | Admitting: Physician Assistant

## 2016-06-27 NOTE — Telephone Encounter (Signed)
Patient reports he is doing better. He is concerned he may need a refill. He is however having less pain. Apparently he feels he wipes off most of the medication when he has a bowel movement. He is reassured. He will call if he does not continue to improve

## 2016-06-28 ENCOUNTER — Other Ambulatory Visit: Payer: Self-pay

## 2016-06-28 ENCOUNTER — Telehealth: Payer: Self-pay | Admitting: Gastroenterology

## 2016-06-28 ENCOUNTER — Other Ambulatory Visit (INDEPENDENT_AMBULATORY_CARE_PROVIDER_SITE_OTHER): Payer: Medicare Other

## 2016-06-28 DIAGNOSIS — K625 Hemorrhage of anus and rectum: Secondary | ICD-10-CM

## 2016-06-28 LAB — HEMOGLOBIN: HEMOGLOBIN: 14.5 g/dL (ref 13.0–17.0)

## 2016-06-28 LAB — HEMATOCRIT: HEMATOCRIT: 42.8 % (ref 39.0–52.0)

## 2016-06-28 NOTE — Telephone Encounter (Signed)
Patient advised to come to our lab, hopefully before noon today, for a STAT hemoglobin and hematocrit.

## 2016-06-28 NOTE — Telephone Encounter (Signed)
Patient called to report that he had to change the pad one time over night. He states that when he is up and moving around or has a bowel movement that is when he notices the bright red blood. Patient denies any abdominal pain, shortness of breath or dizziness. He is concerned with his history of diverticulitis and having had surgical repair, experienced a lot of rectal bleeding and received 3 units of blood. Please advise.

## 2016-06-28 NOTE — Telephone Encounter (Signed)
Thank you for the call.  I do not think this is diverticular bleeding, it is from the banding site ulcer.  Please have him come to the lab today for a STAT hemoglobin/hematocrit.

## 2016-07-01 ENCOUNTER — Telehealth: Payer: Self-pay | Admitting: Gastroenterology

## 2016-07-01 NOTE — Telephone Encounter (Signed)
Please do not resume aspirin until a week from today. I am very glad to hear he is doing better.

## 2016-07-01 NOTE — Telephone Encounter (Signed)
Patient advised to start aspirin back next week.

## 2016-07-01 NOTE — Telephone Encounter (Signed)
Patient reports that rectal bleeding has stopped, he will have just a smudge on the paper when having a bm. He is wanting to know if it is okay to restart taking his aspirin 81 mg. Please advise.

## 2016-08-21 ENCOUNTER — Other Ambulatory Visit: Payer: Self-pay | Admitting: Dermatology

## 2016-10-25 ENCOUNTER — Other Ambulatory Visit: Payer: Self-pay | Admitting: Internal Medicine

## 2016-11-04 ENCOUNTER — Other Ambulatory Visit: Payer: Self-pay | Admitting: Internal Medicine

## 2016-11-04 ENCOUNTER — Ambulatory Visit
Admission: RE | Admit: 2016-11-04 | Discharge: 2016-11-04 | Disposition: A | Discharge status: Home / Self Care | Payer: Medicare Other | Source: Ambulatory Visit | Attending: Internal Medicine | Admitting: Internal Medicine

## 2016-11-04 DIAGNOSIS — R059 Cough, unspecified: Secondary | ICD-10-CM

## 2016-11-04 DIAGNOSIS — R05 Cough: Secondary | ICD-10-CM

## 2016-11-08 ENCOUNTER — Ambulatory Visit: Payer: Medicare Other | Admitting: Internal Medicine

## 2016-12-31 ENCOUNTER — Encounter: Payer: Self-pay | Admitting: Internal Medicine

## 2016-12-31 ENCOUNTER — Ambulatory Visit: Payer: Medicare Other | Admitting: Internal Medicine

## 2016-12-31 VITALS — BP 162/78 | HR 57 | Ht 66.5 in | Wt 211.6 lb

## 2016-12-31 DIAGNOSIS — I10 Essential (primary) hypertension: Secondary | ICD-10-CM | POA: Diagnosis not present

## 2016-12-31 DIAGNOSIS — I252 Old myocardial infarction: Secondary | ICD-10-CM

## 2016-12-31 DIAGNOSIS — I480 Paroxysmal atrial fibrillation: Secondary | ICD-10-CM

## 2016-12-31 DIAGNOSIS — I2510 Atherosclerotic heart disease of native coronary artery without angina pectoris: Secondary | ICD-10-CM | POA: Diagnosis not present

## 2016-12-31 NOTE — Patient Instructions (Signed)
Your physician recommends that you continue on your current medications as directed. Please refer to the Current Medication list given to you today.  Your physician wants you to follow-up in: ONE YEAR with Dr. Debara Pickett. You will receive a reminder letter in the mail two months in advance. If you don't receive a letter, please call our office to schedule the follow-up appointment.

## 2016-12-31 NOTE — Progress Notes (Signed)
OFFICE NOTE  Chief Complaint:  No complaints  Primary Care Physician: Levin Erp, MD  HPI:  Gary [name] is a 83 year old gentleman who has a history of coronary disease, was previously followed by Dr. Rex Kras. In 2011 he had a nonSTEMI with an occluded circumflex artery and collaterals. Therefore, he has been treated medically. He has done pretty well with that. He did have chest pain in 2013 and ended up having gallbladder disease and had that removed in March 2013. From time to time, he complains of some fatigue and has been intolerant of Bystolic, was switched to bisoprolol, seems to be tolerating that pretty well. There is also concern about some lower extremity edema which he has at the sock line but does take furosemide and seems to be helping with that. Blood pressure seems to be pretty well controlled and he does do some exercise a couple times a week and is currently denying any chest pain, worsening shortness of breath, palpitations, presyncope or syncopal symptoms. He underwent back surgery this past year for back pain in June by Dr. Belinda Block and still reports problems with sciatic type nerve pain down the right leg. He currently was in the courtesy room in July with chest pain, ruled out for MI by troponins.  Chest pain at that time was felt to be atypical. He has not had recurrence and did not have any complications with his surgery. He currently denies any chest pain or shortness of breath with exertion. He did inquire as to whether it was safe to continue to take AndroGel, which should be okay for him.  His only other concern is some positional dizziness at night.  Gary [name] returns today for follow-up. He is complaining of left hip pain is been undergoing some injections. He is considering left hip replacement. He seems to have done fairly well from the right hip surgery. He does report some mild shortness of breath with exertion but is not as active as he could be. Again he  has known coronary disease with an occluded circumflex and collateral vessels found by catheterization in 2011. He has not had recurrent testing since that time. He is also reporting difficulty reaching climax during sex. He has a history of low testosterone on medication and previously was seeing Dr. Gaynelle Arabian.  10/31/2015   Gary [name] returns today for follow-up. He reports that he ultimately did not undergo hip surgery because of concern for recovery. He reported his orthopedist was concerned about doing surgery because he had an extended hospital course secondary to C. difficile colitis which complicated his hip surgery in the past. He is similar problems again today including problems with short-term memory. He reports intermittent Unk Lightning with depression and is not sure that the Celexa he is taking his helping him much. He also reports anorgasmia. As mentioned before is a history of low testosterone. This may be related to medications, polypharmacy or depression. From a cardiac standpoint he currently denies any chest pain. He did have upper abdominal pain which was reported as chest pain and visited the emergency department April of this year. Extensive workup was negative. Stress testing last fall indicated no ischemia and a preserved EF with small area of scar which is known.  05/14/2016  Gary [name] was seen today in follow-up. Recently he underwent some cataract surgery. He denies any chest pain or worsenhoseing shortness of breath. He had what sounds like a presyncopal episode. He was standing for about 20 minutes in one  position talking to his neighbor holding a leaf blower. Apparently he became a little dizzy or presyncopal and fell to his left side striking his left pectoralis area on a car meter. He remembers the event. He did not fall to the ground. He's had no further events. There was no associated chest pain or heart racing. He does have a bruise over the left pectoralis muscle. He takes  Lasix every day for unknown reasons and has no significant swelling.  12/31/2016  Gary [name] returns today for follow-up.  He still has trouble with his hips.  He denies any chest pain or worsening shortness of breath.  Initially his blood pressure was elevated 162/78 however recheck came down to 142/70.  EKG shows sinus bradycardia with nonspecific ST and T wave changes.  He has had no recent chest pain, dizziness or presyncopal episodes.  PMHx:  Past Medical History:  Diagnosis Date  . Adenomatous colon polyp    2010   . Anemia   . Anxiety   . Arthritis    "hips and lower back"  . Arthritis pain   . Blood transfusion   . Chest pain 03/29/11   2D Echo without contrast on 03/29/11 - EF= 55-60%.  . Cholecystitis   . Chronic back pain greater than 3 months duration   . Chronic kidney disease   . Colon polyps   . Complication of anesthesia    once slow to wake up  . Coronary angioplasty status 2011  . Coronary artery disease   . Diarrhea   . Diverticulosis   . Dyslipidemia   . GERD (gastroesophageal reflux disease)   . GI bleeding after 07/2009   "triggered by Plavix & ASA; from my diverticulosis"  . H/O Clostridium difficile infection   . H/O myocardial perfusion scan 04/04/11   For Pre-noncardiac surgery - EF = 67%, no ischemia, and is considered low risk.  Marland Kitchen Heart attack ([place]) 07/2009   nonSTEMI with an occluded circumflex artery and collaterals.  Marland Kitchen Heart murmur   . Hemorrhoids   . History of shingles   . Hyperlipidemia   . Hypertension   . Kidney stones   . Lower extremity edema    Taking furosemide and it seems to be helping.  . Neuromuscular disorder ([place])   . Nonspecific chest pain 08/26/2012   Went to ED 08/26/12  . Stomach problems     Past Surgical History:  Procedure Laterality Date  . BACK SURGERY  1997  . CARDIAC CATHETERIZATION  08/09/2009   Circumflex 100% occluded with right-to-left collaterals, mild irregularities in the proximal and distal LAD and  diagonal system, normal LV systolic function, and minor infrarenal irregularities, but no abdominal aortic aneurysm.  . CHOLECYSTECTOMY  04/12/2011   Procedure: CHOLECYSTECTOMY;  Surgeon: Adin Hector, MD;  Location: [facility];  Service: General;  Laterality: N/A;  . CHOLECYSTECTOMY  04/12/2011   Procedure: LAPAROSCOPIC CHOLECYSTECTOMY;  Surgeon: Adin Hector, MD;  Location: [facility];  Service: General;  Laterality: N/A;  converted to open  . COLON SURGERY     COLONOSCOPY  . ESOPHAGOGASTRODUODENOSCOPY N/A 08/05/2013   Procedure: ESOPHAGOGASTRODUODENOSCOPY (EGD);  Surgeon: Jerene Bears, MD;  Location: [facility];  Service: Gastroenterology;  Laterality: N/A;  . EYE SURGERY  1942   "right eye was crossed; they  corrected it"  . LUMBAR FUSION  07/22/2012  . POSTERIOR FUSION LUMBAR SPINE  08/1995   "bottom 4 vertebra"  . SUBTOTAL COLECTOMY  01/24/2010  . TOTAL HIP ARTHROPLASTY  Right 08/03/2013   Procedure: TOTAL HIP ARTHROPLASTY ANTERIOR APPROACH;  Surgeon: Hessie Dibble, MD;  Location: [facility];  Service: Orthopedics;  Laterality: Right;    FAMHx:  Family History  Problem Relation Age of Onset  . Heart disease Father   . Stroke Father   . Pancreatic cancer Mother   . Diverticulosis Sister        x4  . Colon cancer Neg Hx     SOCHx:   reports that  has never smoked. he has never used smokeless tobacco. He reports that he does not drink alcohol or use drugs.  ALLERGIES:  Allergies  Allergen Reactions  . Naproxen Rash  . Clopidogrel Bisulfate Other (See Comments)    H/O diverticulitis; prone to rectal bleeding.  Plavix complicated this.  jkl  . Sulfonamide Derivatives Hives  . Other Other (See Comments)    Narcotics-C. Diff  . Plavix [Clopidogrel Bisulfate]   . Latex Rash  . Tape Itching and Rash    Latex-Adhesive tape    ROS: Pertinent items noted in HPI and remainder of comprehensive ROS otherwise negative.  HOME MEDS: Current Outpatient Medications  Medication Sig  Dispense Refill  . AMBULATORY NON FORMULARY MEDICATION Place 2 mLs rectally 3 (three) times daily. Medication Name: 5 % lidocaine ointment mixed with 0.125 nitroglycerin ointment 30 g 0  . ANDROGEL PUMP 20.25 MG/ACT (1.62%) GEL Apply topically daily.     Marland Kitchen aspirin EC 81 MG tablet Take 81 mg by mouth daily.    . bisoprolol (ZEBETA) 5 MG tablet TAKE 1/2 TABLETS (2.5 MG TOTAL) BY MOUTH DAILY. 45 tablet 3  . citalopram (CELEXA) 40 MG tablet TAKE 1 TABLET EVERY DAY 90 tablet 2  . doxazosin (CARDURA) 8 MG tablet Take 4 mg by mouth at bedtime.     . fluticasone (FLONASE) 50 MCG/ACT nasal spray Place 2 sprays into the nose daily.     . furosemide (LASIX) 20 MG tablet Take 1 tablet (20 mg total) by mouth daily as needed. 90 tablet 1  . gabapentin (NEURONTIN) 400 MG capsule Take 400 mg by mouth 3 (three) times daily.     Marland Kitchen LORazepam (ATIVAN) 0.5 MG tablet Take 0.25 mg by mouth at bedtime.     . nitroGLYCERIN (NITROSTAT) 0.4 MG SL tablet PLACE 1 TABLET UNDER THE TONGUE EVERY 5 MINUTES AS NEEDED FOR CHEST PAIN FOR 3 DOSES 25 tablet 0  . saccharomyces boulardii (FLORASTOR) 250 MG capsule Take 1 capsule (250 mg total) by mouth 2 (two) times daily. 60 capsule 0  . simvastatin (ZOCOR) 40 MG tablet TAKE 1 TABLET BY MOUTH AT BEDTIME 90 tablet 2  . Tamsulosin HCl (FLOMAX) 0.4 MG CAPS Take 0.4 mg by mouth daily after supper.    . traMADol-acetaminophen (ULTRACET) 37.5-325 MG per tablet Take 2 tablets by mouth 4 (four) times daily as needed for moderate pain.      No current facility-administered medications for this visit.     LABS/IMAGING: No results found for this or any previous visit (from the past 48 hour(s)). No results found.  VITALS: BP (!) 162/78   Pulse (!) 57   Ht 5' 6.5" (1.689 m)   Wt 211 lb 9.6 oz (96 kg)   BMI 33.64 kg/m   EXAM: General appearance: alert and no distress Neck: no carotid bruit and no JVD Lungs: clear to auscultation bilaterally Heart: regular rate and rhythm, S1, S2  normal, no murmur, click, rub or gallop Abdomen: soft, non-tender; bowel sounds normal;  no masses,  no organomegaly Extremities: extremities normal, atraumatic, no cyanosis or edema and Bruising over the left pectoralis muscle Pulses: 2+ and symmetric Skin: Skin color, texture, turgor normal. No rashes or lesions Neurologic: Grossly normal Psych: Mood, affect normal  EKG: Sinus bradycardia, nonspecific ST and T wave changes at 57-personally reviewed  ASSESSMENT: 1. Coronary artery disease status post occluded circumflex in 2011 with collaterals 2. Hypertension-controlled 3. Dyslipidemia-at goal 4. Legs/back pain - s/p R THR, now contemplating left hip replacement 5. Indeterminate risk for hip surgery 6. ?PAF - related to sepsis, less than 5 minutes duration 7. Depression 8. Anorgasmia 9. Short-term memory loss  PLAN: 1.   Gary [name] really has not had any recent episodes of presyncope, chest pain, worsening shortness of breath or atrial fibrillation.  He continues to have problems with his hips however reports that his left hip pain is improved and he is not pursuing surgery.  He has a history of C. difficile colitis in the past as well and is concerned about any further surgeries or antibiotics.  Recently had an upper respiratory infection however he has improved from that.  No changes to his medicines today.  Follow-up with me annually or sooner as necessary.  Pixie Casino, MD, [facility], [place] Director of the [facility]  [facility] Attending Cardiologist  Direct Dial: [phone]  Fax: [phone]  Website:  [email or web address]

## 2017-01-01 ENCOUNTER — Other Ambulatory Visit: Payer: Self-pay | Admitting: Dermatology

## 2017-02-06 ENCOUNTER — Other Ambulatory Visit: Payer: Self-pay | Admitting: Dermatology

## 2017-02-20 ENCOUNTER — Other Ambulatory Visit: Payer: Self-pay | Admitting: Dermatology

## 2017-04-16 ENCOUNTER — Ambulatory Visit: Payer: Self-pay | Admitting: Podiatry

## 2017-07-02 ENCOUNTER — Ambulatory Visit: Payer: Self-pay | Admitting: Otolaryngology

## 2017-07-02 NOTE — H&P (Signed)
PREOPERATIVE H&P  Chief Complaint: sore left ear  HPI: Gary [name] is a 84 y.o. male who presents for evaluation of a sore in his left ear that has been chronic. He cannot sleep on that side of his head because of the pain. He has had previous surgery at [facility] with Dr. Gale Journey that helped a little bit He is followed by Dr. Denna Haggard with history of skin cancers.But he has had a chronic sore on the back of his left helix for several years. The cartilage is prominent in this region. I discussed excision of this under local anesthetic with the patient and he would like to proceed.  Past Medical History:  Diagnosis Date  . Adenomatous colon polyp    2010   . Anemia   . Anxiety   . Arthritis    "hips and lower back"  . Arthritis pain   . Blood transfusion   . Chest pain 03/29/11   2D Echo without contrast on 03/29/11 - EF= 55-60%.  . Cholecystitis   . Chronic back pain greater than 3 months duration   . Chronic kidney disease   . Colon polyps   . Complication of anesthesia    once slow to wake up  . Coronary angioplasty status 2011  . Coronary artery disease   . Diarrhea   . Diverticulosis   . Dyslipidemia   . GERD (gastroesophageal reflux disease)   . GI bleeding after 07/2009   "triggered by Plavix & ASA; from my diverticulosis"  . H/O Clostridium difficile infection   . H/O myocardial perfusion scan 04/04/11   For Pre-noncardiac surgery - EF = 67%, no ischemia, and is considered low risk.  Marland Kitchen Heart attack ([place]) 07/2009   nonSTEMI with an occluded circumflex artery and collaterals.  Marland Kitchen Heart murmur   . Hemorrhoids   . History of shingles   . Hyperlipidemia   . Hypertension   . Kidney stones   . Lower extremity edema    Taking furosemide and it seems to be helping.  . Neuromuscular disorder ([place])   . Nonspecific chest pain 08/26/2012   Went to ED 08/26/12  . Stomach problems    Past Surgical History:  Procedure Laterality Date  . BACK SURGERY  1997  . CARDIAC  CATHETERIZATION  08/09/2009   Circumflex 100% occluded with right-to-left collaterals, mild irregularities in the proximal and distal LAD and diagonal system, normal LV systolic function, and minor infrarenal irregularities, but no abdominal aortic aneurysm.  . CHOLECYSTECTOMY  04/12/2011   Procedure: CHOLECYSTECTOMY;  Surgeon: Adin Hector, MD;  Location: [place];  Service: General;  Laterality: N/A;  . CHOLECYSTECTOMY  04/12/2011   Procedure: LAPAROSCOPIC CHOLECYSTECTOMY;  Surgeon: Adin Hector, MD;  Location: [place];  Service: General;  Laterality: N/A;  converted to open  . COLON SURGERY     COLONOSCOPY  . ESOPHAGOGASTRODUODENOSCOPY N/A 08/05/2013   Procedure: ESOPHAGOGASTRODUODENOSCOPY (EGD);  Surgeon: Jerene Bears, MD;  Location: [place];  Service: Gastroenterology;  Laterality: N/A;  . EYE SURGERY  1942   "right eye was crossed; they  corrected it"  . LUMBAR FUSION  07/22/2012  . POSTERIOR FUSION LUMBAR SPINE  08/1995   "bottom 4 vertebra"  . SUBTOTAL COLECTOMY  01/24/2010  . TOTAL HIP ARTHROPLASTY Right 08/03/2013   Procedure: TOTAL HIP ARTHROPLASTY ANTERIOR APPROACH;  Surgeon: Hessie Dibble, MD;  Location: [place];  Service: Orthopedics;  Laterality: Right;   Social History   Socioeconomic History  . Marital  status: Married    Spouse name: Not on file  . Number of children: 2  . Years of education: Not on file  . Highest education level: Not on file  Occupational History  . Occupation: Art therapist  Social Needs  . Financial resource strain: Not on file  . Food insecurity:    Worry: Not on file    Inability: Not on file  . Transportation needs:    Medical: Not on file    Non-medical: Not on file  Tobacco Use  . Smoking status: Never Smoker  . Smokeless tobacco: Never Used  Substance and Sexual Activity  . Alcohol use: No  . Drug use: No  . Sexual activity: Not Currently  Lifestyle  . Physical activity:    Days per week: Not on file    Minutes per session: Not  on file  . Stress: Not on file  Relationships  . Social connections:    Talks on phone: Not on file    Gets together: Not on file    Attends religious service: Not on file    Active member of club or organization: Not on file    Attends meetings of clubs or organizations: Not on file    Relationship status: Not on file  Other Topics Concern  . Not on file  Social History Narrative   Daily caffeine use.   Family History  Problem Relation Age of Onset  . Heart disease Father   . Stroke Father   . Pancreatic cancer Mother   . Diverticulosis Sister        x4  . Colon cancer Neg Hx    Allergies  Allergen Reactions  . Naproxen Rash  . Clopidogrel Bisulfate Other (See Comments)    H/O diverticulitis; prone to rectal bleeding.  Plavix complicated this.  jkl  . Sulfonamide Derivatives Hives  . Other Other (See Comments)    Narcotics-C. Diff  . Plavix [Clopidogrel Bisulfate]   . Latex Rash  . Tape Itching and Rash    Latex-Adhesive tape   Prior to Admission medications   Medication Sig Start Date End Date Taking? Authorizing Provider  AMBULATORY NON FORMULARY MEDICATION Place 2 mLs rectally 3 (three) times daily. Medication Name: 5 % lidocaine ointment mixed with 0.125 nitroglycerin ointment 06/24/16   Esterwood, Amy S, PA-C  ANDROGEL PUMP 20.25 MG/ACT (1.62%) GEL Apply topically daily.  01/15/14   [provider]  aspirin EC 81 MG tablet Take 81 mg by mouth daily.    [provider]  bisoprolol (ZEBETA) 5 MG tablet TAKE 1/2 TABLETS (2.5 MG TOTAL) BY MOUTH DAILY. 01/23/15   Pixie Casino, MD  citalopram (CELEXA) 40 MG tablet TAKE 1 TABLET EVERY DAY 11/09/13   Hilty, Nadean Corwin, MD  doxazosin (CARDURA) 8 MG tablet Take 4 mg by mouth at bedtime.     [provider]  fluticasone (FLONASE) 50 MCG/ACT nasal spray Place 2 sprays into the nose daily.     [provider]  furosemide (LASIX) 20 MG tablet Take 1 tablet (20 mg total) by mouth daily as  needed. 05/14/16   Hilty, Nadean Corwin, MD  gabapentin (NEURONTIN) 400 MG capsule Take 400 mg by mouth 3 (three) times daily.     [provider]  LORazepam (ATIVAN) 0.5 MG tablet Take 0.25 mg by mouth at bedtime.     [provider]  nitroGLYCERIN (NITROSTAT) 0.4 MG SL tablet PLACE 1 TABLET UNDER THE TONGUE EVERY 5 MINUTES AS NEEDED FOR  CHEST PAIN FOR 3 DOSES 10/25/16   Hilty, Nadean Corwin, MD  saccharomyces boulardii (FLORASTOR) 250 MG capsule Take 1 capsule (250 mg total) by mouth 2 (two) times daily. 08/17/13   Annita Brod, MD  simvastatin (ZOCOR) 40 MG tablet TAKE 1 TABLET BY MOUTH AT BEDTIME 03/18/16   Hilty, Nadean Corwin, MD  Tamsulosin HCl (FLOMAX) 0.4 MG CAPS Take 0.4 mg by mouth daily after supper. 02/04/11   Charlynne Cousins, MD  traMADol-acetaminophen (ULTRACET) 37.5-325 MG per tablet Take 2 tablets by mouth 4 (four) times daily as needed for moderate pain.  09/23/12   [provider]     Positive ROS: negative  All other systems have been reviewed and were otherwise negative with the exception of those mentioned in the HPI and as above.  Physical Exam: There were no vitals filed for this visit.  General: Alert, no acute distress Oral: Normal oral mucosa and tonsils Nasal: Clear nasal passages Neck: No palpable adenopathy or thyroid nodules Ear: Ear canal is clear with normal appearing TMs.he has a small sore behind the left helix with underlying thick cartilage or cartilage nodule. Cardiovascular: Regular rate and rhythm, no murmur.  Respiratory: Clear to auscultation Neurologic: Alert and oriented x 3   Assessment/Plan: OTALGIA. Left ear sore nodule. Plan for Procedure(s): EXCISION LEFT EAR LESION, INTERMEDIATE CLOSURE     Melony Overly, MD 07/02/2017 1:12 PM

## 2017-07-04 ENCOUNTER — Encounter (HOSPITAL_COMMUNITY): Payer: Self-pay | Admitting: Anesthesiology

## 2017-07-04 ENCOUNTER — Encounter (HOSPITAL_BASED_OUTPATIENT_CLINIC_OR_DEPARTMENT_OTHER): Payer: Self-pay | Admitting: *Deleted

## 2017-07-04 ENCOUNTER — Other Ambulatory Visit: Payer: Self-pay

## 2017-07-09 ENCOUNTER — Ambulatory Visit (HOSPITAL_BASED_OUTPATIENT_CLINIC_OR_DEPARTMENT_OTHER): Admission: RE | Admit: 2017-07-09 | Payer: Medicare Other | Source: Ambulatory Visit | Admitting: Otolaryngology

## 2017-07-09 SURGERY — EXCISION MASS
Anesthesia: General | Laterality: Left

## 2017-08-18 ENCOUNTER — Telehealth: Payer: Self-pay | Admitting: Internal Medicine

## 2017-08-18 NOTE — Telephone Encounter (Signed)
New Message     Patient's wife scheduled an appt for Aug 30th. He has had sob for 6 weeks, just wanted to make sure it's ok to wait that long to be seen?

## 2017-08-18 NOTE — Telephone Encounter (Signed)
Returned the call to the patient's wife, per the dpr. She stated that the patient has been having some shortness of breath on exertion for the last few days.  The patient denies chest pain but does have some mild edema in his ankles. Appointment has been made for 08/20/17 with Kerin Ransom, [place]. She has been advised to call back if anything is needed before then.

## 2017-08-20 ENCOUNTER — Ambulatory Visit: Payer: Medicare Other | Admitting: Cardiology

## 2017-09-15 ENCOUNTER — Ambulatory Visit: Payer: Medicare Other | Admitting: Gastroenterology

## 2017-09-15 ENCOUNTER — Encounter: Payer: Self-pay | Admitting: Internal Medicine

## 2017-10-03 ENCOUNTER — Encounter: Payer: Self-pay | Admitting: Internal Medicine

## 2017-10-03 ENCOUNTER — Ambulatory Visit: Payer: Medicare Other | Admitting: Internal Medicine

## 2017-10-03 VITALS — BP 132/70 | HR 59 | Ht 65.5 in | Wt 210.0 lb

## 2017-10-03 DIAGNOSIS — F329 Major depressive disorder, single episode, unspecified: Secondary | ICD-10-CM | POA: Diagnosis not present

## 2017-10-03 DIAGNOSIS — E669 Obesity, unspecified: Secondary | ICD-10-CM

## 2017-10-03 DIAGNOSIS — R0683 Snoring: Secondary | ICD-10-CM | POA: Insufficient documentation

## 2017-10-03 DIAGNOSIS — I10 Essential (primary) hypertension: Secondary | ICD-10-CM | POA: Diagnosis not present

## 2017-10-03 DIAGNOSIS — R5383 Other fatigue: Secondary | ICD-10-CM | POA: Insufficient documentation

## 2017-10-03 DIAGNOSIS — F32A Depression, unspecified: Secondary | ICD-10-CM

## 2017-10-03 NOTE — Patient Instructions (Addendum)
Your physician has recommended that you have a sleep study. This test records several body functions during sleep, including: brain activity, eye movement, oxygen and carbon dioxide blood levels, heart rate and rhythm, breathing rate and rhythm, the flow of air through your mouth and nose, snoring, body muscle movements, and chest and belly movement. -- this test will be pre-certified with your insurance -- you will then be called to schedule an appointment @ [facility] wants you to follow-up in: 6 months with Dr. Debara Pickett. You will receive a reminder letter in the mail two months in advance. If you don't receive a letter, please call our office to schedule the follow-up appointment.

## 2017-10-03 NOTE — Progress Notes (Signed)
OFFICE NOTE  Chief Complaint:  Fatigue, depression  Primary Care Physician: Levin Erp, MD  HPI:  Gary [name] is a 84 year old gentleman who has a history of coronary disease, was previously followed by Dr. Rex [name]. In 2011 he had a nonSTEMI with an occluded circumflex artery and collaterals. Therefore, he has been treated medically. He has done pretty well with that. He did have chest pain in 2013 and ended up having gallbladder disease and had that removed in March 2013. From time to time, he complains of some fatigue and has been intolerant of Bystolic, was switched to bisoprolol, seems to be tolerating that pretty well. There is also concern about some lower extremity edema which he has at the sock line but does take furosemide and seems to be helping with that. Blood pressure seems to be pretty well controlled and he does do some exercise a couple times a week and is currently denying any chest pain, worsening shortness of breath, palpitations, presyncope or syncopal symptoms. He underwent back surgery this past year for back pain in June by Dr. Belinda [name] and still reports problems with sciatic type nerve pain down the right leg. He currently was in the courtesy room in July with chest pain, ruled out for MI by troponins.  Chest pain at that time was felt to be atypical. He has not had recurrence and did not have any complications with his surgery. He currently denies any chest pain or shortness of breath with exertion. He did inquire as to whether it was safe to continue to take AndroGel, which should be okay for him.  His only other concern is some positional dizziness at night.  Mr. [name] returns today for follow-up. He is complaining of left hip pain is been undergoing some injections. He is considering left hip replacement. He seems to have done fairly well from the right hip surgery. He does report some mild shortness of breath with exertion but is not as active as he could be. Again  he has known coronary disease with an occluded circumflex and collateral vessels found by catheterization in 2011. He has not had recurrent testing since that time. He is also reporting difficulty reaching climax during sex. He has a history of low testosterone on medication and previously was seeing Dr. Gaynelle [name].  10/31/2015   Mr. Rounds returns today for follow-up. He reports that he ultimately did not undergo hip surgery because of concern for recovery. He reported his orthopedist was concerned about doing surgery because he had an extended hospital course secondary to C. difficile colitis which complicated his hip surgery in the past. He is similar problems again today including problems with short-term memory. He reports intermittent [place] with depression and is not sure that the Celexa he is taking his helping him much. He also reports anorgasmia. As mentioned before is a history of low testosterone. This may be related to medications, polypharmacy or depression. From a cardiac standpoint he currently denies any chest pain. He did have upper abdominal pain which was reported as chest pain and visited the emergency department April of this year. Extensive workup was negative. Stress testing last fall indicated no ischemia and a preserved EF with small area of scar which is known.  05/14/2016  [name] was seen today in follow-up. Recently he underwent some cataract surgery. He denies any chest pain or worsenhoseing shortness of breath. He had what sounds like a presyncopal episode. He was standing for about 20 minutes in one  position talking to his neighbor holding a leaf blower. Apparently he became a little dizzy or presyncopal and fell to his left side striking his left pectoralis area on a car meter. He remembers the event. He did not fall to the ground. He's had no further events. There was no associated chest pain or heart racing. He does have a bruise over the left pectoralis muscle. He takes  Lasix every day for unknown reasons and has no significant swelling.  12/31/2016  [name] returns today for follow-up.  He still has trouble with his hips.  He denies any chest pain or worsening shortness of breath.  Initially his blood pressure was elevated 162/78 however recheck came down to 142/70.  EKG shows sinus bradycardia with nonspecific ST and T wave changes.  He has had no recent chest pain, dizziness or presyncopal episodes.  10/03/2017  [name] was seen today in follow-up.  Recently he had sent a message saying that he was concerned about his ongoing fatigue and depression and wondered if this could be related to a sleep disorder.  His primary care physician suggested he should have a sleep study.  Actually think this is quite reasonable.  He does have a history of A. fib and palpitations in the past.  Recently he has had some palpitations although infrequent.  He does report very poor exercise tolerance and general fatigue.  PMHx:  Past Medical History:  Diagnosis Date  . Adenomatous colon polyp    2010   . Anemia   . Anxiety   . Arthritis    "hips and lower back"  . Arthritis pain   . Blood transfusion   . Chest pain 03/29/11   2D Echo without contrast on 03/29/11 - EF= 55-60%.  . Cholecystitis   . Chronic back pain greater than 3 months duration   . Chronic kidney disease   . Colon polyps   . Complication of anesthesia    once slow to wake up  . Coronary angioplasty status 2011  . Coronary artery disease   . Diarrhea   . Diverticulosis   . Dyslipidemia   . GERD (gastroesophageal reflux disease)   . GI bleeding after 07/2009   "triggered by Plavix & ASA; from my diverticulosis"  . H/O Clostridium difficile infection   . H/O myocardial perfusion scan 04/04/11   For Pre-noncardiac surgery - EF = 67%, no ischemia, and is considered low risk.  Marland Kitchen Heart attack ([place]) 07/2009   nonSTEMI with an occluded circumflex artery and collaterals.  Marland Kitchen Heart murmur   .  Hemorrhoids   . History of shingles   . Hyperlipidemia   . Hypertension   . Kidney stones   . Lower extremity edema    Taking furosemide and it seems to be helping.  . Neuromuscular disorder ([place])   . Nonspecific chest pain 08/26/2012   Went to ED 08/26/12  . Stomach problems     Past Surgical History:  Procedure Laterality Date  . BACK SURGERY  1997  . CARDIAC CATHETERIZATION  08/09/2009   Circumflex 100% occluded with right-to-left collaterals, mild irregularities in the proximal and distal LAD and diagonal system, normal LV systolic function, and minor infrarenal irregularities, but no abdominal aortic aneurysm.  . CHOLECYSTECTOMY  04/12/2011   Procedure: CHOLECYSTECTOMY;  Surgeon: Adin Hector, MD;  Location: [place];  Service: General;  Laterality: N/A;  . CHOLECYSTECTOMY  04/12/2011   Procedure: LAPAROSCOPIC CHOLECYSTECTOMY;  Surgeon: Adin Hector, MD;  Location: [facility];  Service: General;  Laterality: N/A;  converted to open  . COLON SURGERY     COLONOSCOPY  . ESOPHAGOGASTRODUODENOSCOPY N/A 08/05/2013   Procedure: ESOPHAGOGASTRODUODENOSCOPY (EGD);  Surgeon: Jerene Bears, MD;  Location: [place];  Service: Gastroenterology;  Laterality: N/A;  . EYE SURGERY  1942   "right eye was crossed; they  corrected it"  . LUMBAR FUSION  07/22/2012  . POSTERIOR FUSION LUMBAR SPINE  08/1995   "bottom 4 vertebra"  . SUBTOTAL COLECTOMY  01/24/2010  . TOTAL HIP ARTHROPLASTY Right 08/03/2013   Procedure: TOTAL HIP ARTHROPLASTY ANTERIOR APPROACH;  Surgeon: Hessie Dibble, MD;  Location: [place];  Service: Orthopedics;  Laterality: Right;    FAMHx:  Family History  Problem Relation Age of Onset  . Heart disease Father   . Stroke Father   . Pancreatic cancer Mother   . Diverticulosis Sister        x4  . Colon cancer Neg Hx     SOCHx:   reports that he has never smoked. He has never used smokeless tobacco. He reports that he does not drink alcohol or use drugs.  ALLERGIES:  Allergies   Allergen Reactions  . Naproxen Rash  . Clopidogrel Bisulfate Other (See Comments)    H/O diverticulitis; prone to rectal bleeding.  Plavix complicated this.  jkl  . Sulfonamide Derivatives Hives  . Other Other (See Comments)    Narcotics-C. Diff  . Plavix [Clopidogrel Bisulfate]   . Latex Rash  . Tape Itching and Rash    Latex-Adhesive tape    ROS: Pertinent items noted in HPI and remainder of comprehensive ROS otherwise negative.  HOME MEDS: Current Outpatient Medications  Medication Sig Dispense Refill  . AMBULATORY NON FORMULARY MEDICATION Place 2 mLs rectally 3 (three) times daily. Medication Name: 5 % lidocaine ointment mixed with 0.125 nitroglycerin ointment 30 g 0  . ANDROGEL PUMP 20.25 MG/ACT (1.62%) GEL Apply topically daily.     Marland Kitchen aspirin EC 81 MG tablet Take 81 mg by mouth daily.    . bisoprolol (ZEBETA) 5 MG tablet TAKE 1/2 TABLETS (2.5 MG TOTAL) BY MOUTH DAILY. 45 tablet 3  . citalopram (CELEXA) 40 MG tablet TAKE 1 TABLET EVERY DAY 90 tablet 2  . doxazosin (CARDURA) 8 MG tablet Take 4 mg by mouth at bedtime.     . fluticasone (FLONASE) 50 MCG/ACT nasal spray Place 2 sprays into the nose daily.     . furosemide (LASIX) 20 MG tablet Take 1 tablet (20 mg total) by mouth daily as needed. 90 tablet 1  . gabapentin (NEURONTIN) 400 MG capsule Take 400 mg by mouth 3 (three) times daily.     Marland Kitchen LORazepam (ATIVAN) 0.5 MG tablet Take 0.25 mg by mouth at bedtime.     . nitroGLYCERIN (NITROSTAT) 0.4 MG SL tablet PLACE 1 TABLET UNDER THE TONGUE EVERY 5 MINUTES AS NEEDED FOR CHEST PAIN FOR 3 DOSES 25 tablet 0  . simvastatin (ZOCOR) 40 MG tablet TAKE 1 TABLET BY MOUTH AT BEDTIME 90 tablet 2  . Tamsulosin HCl (FLOMAX) 0.4 MG CAPS Take 0.4 mg by mouth daily after supper.    . traMADol-acetaminophen (ULTRACET) 37.5-325 MG per tablet Take 2 tablets by mouth 4 (four) times daily as needed for moderate pain.      No current facility-administered medications for this visit.      LABS/IMAGING: No results found for this or any previous visit (from the past 48 hour(s)). No results found.  VITALS: BP  132/70 (BP Location: Left Arm, Patient Position: Sitting, Cuff Size: Normal)   Pulse (!) 59   Ht 5' 5.5" (1.664 m)   Wt 210 lb (95.3 kg)   BMI 34.41 kg/m   EXAM: General appearance: alert and no distress Neck: no carotid bruit and no JVD Lungs: clear to auscultation bilaterally Heart: regular rate and rhythm, S1, S2 normal, no murmur, click, rub or gallop Abdomen: soft, non-tender; bowel sounds normal; no masses,  no organomegaly Extremities: extremities normal, atraumatic, no cyanosis or edema and Bruising over the left pectoralis muscle Pulses: 2+ and symmetric Skin: Skin color, texture, turgor normal. No rashes or lesions Neurologic: Grossly normal Psych: Mood, affect normal  EKG: Sinus bradycardia 59-personally reviewed  ASSESSMENT: 1. Coronary artery disease status post occluded circumflex in 2011 with collaterals 2. Hypertension-controlled 3. Dyslipidemia-at goal 4. Legs/back pain - s/p R THR, now contemplating left hip replacement 5. Indeterminate risk for hip surgery 6. ?PAF - related to sepsis, less than 5 minutes duration 7. Depression 8. Anorgasmia 9. Short-term memory loss 10. Fatigue, nonrestorative sleep, snoring-possible sleep apnea  PLAN: 1.   Mr. Swavely continues to have fatigue, nonrestorative sleep and snoring with concerns for possible sleep apnea.  He has a history of PAF in the past, possibly related to sepsis.  He also reports depression and anorgasmia.  There is short-term memory loss.  One wonders if this could be related to sleep disorder.  I recommend we arrange for a split-night sleep study.  He can follow-up with me afterwards.  Pixie Casino, MD, [facility], [place] Director of the [facility]  [facility] Attending Cardiologist  Direct Dial:  [phone]  Fax: [phone]  Website:  [email or web address]

## 2017-10-06 ENCOUNTER — Encounter

## 2017-10-06 ENCOUNTER — Telehealth: Payer: Self-pay

## 2017-10-06 NOTE — Telephone Encounter (Signed)
Notification or preauthorization is not required.   Please schedule   Attach referral auth request date thru 11/06/2017.  Decision ID #:[id number]

## 2017-10-08 ENCOUNTER — Telehealth: Payer: Self-pay | Admitting: *Deleted

## 2017-10-08 NOTE — Telephone Encounter (Signed)
Patient is scheduled for lab study on 10/28/17. Patient understands his sleep study will be done at [facility] sleep lab. Patient understands he will receive a sleep packet in a week or so. Patient understands to call if he does not receive the sleep packet in a timely manner. Patient agrees with treatment and thanked me for call.

## 2017-10-08 NOTE — Telephone Encounter (Signed)
-----   Message from Chapman Moss, RN sent at 10/06/2017  3:36 PM EDT ----- Regarding: FW: needs pre-cert for split night study Notification or preauthorization is not required.   Please schedule   Attach referral auth request date thru 11/06/2017.  Decision ID #:[id number] ----- Message ----- From: Fidel Levy, RN Sent: 10/03/2017   2:06 PM EDT To: [facility] Subject: needs pre-cert for split night study           Hello, this patient needs to be pre-certified for a sleep study.   Thanks

## 2017-10-10 ENCOUNTER — Ambulatory Visit: Payer: Medicare Other | Admitting: Internal Medicine

## 2017-10-21 ENCOUNTER — Other Ambulatory Visit: Payer: Self-pay | Admitting: Dermatology

## 2017-10-21 ENCOUNTER — Encounter

## 2017-10-21 ENCOUNTER — Encounter: Payer: Self-pay | Admitting: Gastroenterology

## 2017-10-21 ENCOUNTER — Ambulatory Visit: Payer: Medicare Other | Admitting: Gastroenterology

## 2017-10-21 VITALS — BP 130/80 | HR 72 | Ht 67.0 in | Wt 210.0 lb

## 2017-10-21 DIAGNOSIS — R1032 Left lower quadrant pain: Secondary | ICD-10-CM

## 2017-10-21 DIAGNOSIS — R140 Abdominal distension (gaseous): Secondary | ICD-10-CM | POA: Diagnosis not present

## 2017-10-21 DIAGNOSIS — R110 Nausea: Secondary | ICD-10-CM

## 2017-10-21 NOTE — Patient Instructions (Signed)
If you are age 82 or older, your body mass index should be between 23-30. Your Body mass index is 32.89 kg/m. If this is out of the aforementioned range listed, please consider follow up with your Primary Care Provider.  If you are age 67 or younger, your body mass index should be between 19-25. Your Body mass index is 32.89 kg/m. If this is out of the aformentioned range listed, please consider follow up with your Primary Care Provider.   Follow up as needed.   It was a pleasure to see you today!  Dr. Loletha Carrow

## 2017-10-21 NOTE — Progress Notes (Signed)
[name] Progress Note  Chief Complaint: Nausea  History:  84 year old man, last office visit May 2018 for left lower quadrant pain and prolapsed bleeding internal hemorrhoid.  Previous visits with Dr. Deatra Ina in 2015 with hemorrhoidal banding.  I did banding of a persistent LA internal hemorrhoid at the visit in 2018. He has many years of intermittent left lower quadrant pain, previous subtotal colectomy in 2011 for recurrent diverticular bleeds.  Gary [name] was here to see me to discuss his symptoms.  He is a somewhat tangential historian, and it was a little difficult to characterize his particular concerns today.  He still has intermittent left lower quadrant pain that has been unchanged for years.  He was primarily focused on chronic fatigue and a recent probable diagnosis of obstructive sleep apnea and a visit with his cardiologist.  Some mornings he is feeling nauseated, and this may last a couple of hours.  He feels better if he drinks some diet Pepsi, and he was not interested in any scription antiemetics.  His appetite is variable and his weight stable overall as far as he can tell.  ROS: Cardiovascular:  no chest pain Respiratory:  dyspnea with exertion  Objective:  Current Outpatient Medications:  .  AMBULATORY NON FORMULARY MEDICATION, Place 2 mLs rectally 3 (three) times daily. Medication Name: 5 % lidocaine ointment mixed with 0.125 nitroglycerin ointment, Disp: 30 g, Rfl: 0 .  ANDROGEL PUMP 20.25 MG/ACT (1.62%) GEL, Apply topically daily. , Disp: , Rfl:  .  aspirin EC 81 MG tablet, Take 81 mg by mouth daily., Disp: , Rfl:  .  bisoprolol (ZEBETA) 5 MG tablet, TAKE 1/2 TABLETS (2.5 MG TOTAL) BY MOUTH DAILY., Disp: 45 tablet, Rfl: 3 .  citalopram (CELEXA) 40 MG tablet, TAKE 1 TABLET EVERY DAY, Disp: 90 tablet, Rfl: 2 .  doxazosin (CARDURA) 8 MG tablet, Take 4 mg by mouth at bedtime. , Disp: , Rfl:  .  fluticasone (FLONASE) 50 MCG/ACT nasal spray, Place 2 sprays into the nose daily. ,  Disp: , Rfl:  .  furosemide (LASIX) 20 MG tablet, Take 1 tablet (20 mg total) by mouth daily as needed., Disp: 90 tablet, Rfl: 1 .  gabapentin (NEURONTIN) 400 MG capsule, Take 400 mg by mouth 3 (three) times daily. , Disp: , Rfl:  .  LORazepam (ATIVAN) 0.5 MG tablet, Take 0.25 mg by mouth at bedtime. , Disp: , Rfl:  .  nitroGLYCERIN (NITROSTAT) 0.4 MG SL tablet, PLACE 1 TABLET UNDER THE TONGUE EVERY 5 MINUTES AS NEEDED FOR CHEST PAIN FOR 3 DOSES, Disp: 25 tablet, Rfl: 0 .  simvastatin (ZOCOR) 40 MG tablet, TAKE 1 TABLET BY MOUTH AT BEDTIME, Disp: 90 tablet, Rfl: 2 .  Tamsulosin HCl (FLOMAX) 0.4 MG CAPS, Take 0.4 mg by mouth daily after supper., Disp: , Rfl:  .  traMADol-acetaminophen (ULTRACET) 37.5-325 MG per tablet, Take 2 tablets by mouth 4 (four) times daily as needed for moderate pain. , Disp: , Rfl:   Med list reviewed  Vital signs in last 24 hrs: Vitals:   10/21/17 1406  BP: 130/80  Pulse: 72    Physical Exam Somewhat frail elderly man, antalgic gait, gets on exam table with assistance.  HEENT: sclera anicteric, oral mucosa moist without lesions  Neck: supple, no thyromegaly, JVD or lymphadenopathy  Cardiac: RRR without murmurs, S1S2 heard, mild peripheral edema  Pulm: clear to auscultation bilaterally, normal RR and effort noted  Abdomen: soft, no tenderness, with active bowel sounds.  Scar  Skin;  warm and dry, no jaundice or rash  No recent data   @ASSESSMENTPLANBEGIN @ Assessment:  Chronic left lower quadrant pain Intermittent nausea, difficult to characterize.  His exact concerns were little difficult to pin down today.  He seemed to mostly be focused on that he should have some repeat endoscopic evaluation because of his previous subtotal colectomy.  I I think the risks of doing that outweigh the expected benefits given the chronicity of the symptoms and his overall condition.  I reassured him.  I left the offer open for nausea treatment if he should change  his mind.  Total time 20 minutes, all spent face-to-face with the patient.   Gary [name] Office: [phone]

## 2017-10-28 ENCOUNTER — Encounter (HOSPITAL_BASED_OUTPATIENT_CLINIC_OR_DEPARTMENT_OTHER): Payer: Medicare Other

## 2018-01-09 ENCOUNTER — Emergency Department (HOSPITAL_COMMUNITY)
Admission: EM | Admit: 2018-01-09 | Discharge: 2018-01-09 | Disposition: A | Discharge status: Home / Self Care | Payer: Medicare Other | Attending: Emergency Medicine | Admitting: Emergency Medicine

## 2018-01-09 ENCOUNTER — Encounter (HOSPITAL_COMMUNITY): Payer: Self-pay | Admitting: Emergency Medicine

## 2018-01-09 DIAGNOSIS — I252 Old myocardial infarction: Secondary | ICD-10-CM | POA: Insufficient documentation

## 2018-01-09 DIAGNOSIS — R1012 Left upper quadrant pain: Secondary | ICD-10-CM | POA: Insufficient documentation

## 2018-01-09 DIAGNOSIS — Z91040 Latex allergy status: Secondary | ICD-10-CM | POA: Insufficient documentation

## 2018-01-09 DIAGNOSIS — R739 Hyperglycemia, unspecified: Secondary | ICD-10-CM | POA: Insufficient documentation

## 2018-01-09 DIAGNOSIS — Z79899 Other long term (current) drug therapy: Secondary | ICD-10-CM | POA: Diagnosis not present

## 2018-01-09 DIAGNOSIS — I129 Hypertensive chronic kidney disease with stage 1 through stage 4 chronic kidney disease, or unspecified chronic kidney disease: Secondary | ICD-10-CM | POA: Diagnosis not present

## 2018-01-09 DIAGNOSIS — N183 Chronic kidney disease, stage 3 (moderate): Secondary | ICD-10-CM | POA: Diagnosis not present

## 2018-01-09 DIAGNOSIS — R110 Nausea: Secondary | ICD-10-CM | POA: Diagnosis not present

## 2018-01-09 DIAGNOSIS — R1032 Left lower quadrant pain: Secondary | ICD-10-CM | POA: Insufficient documentation

## 2018-01-09 DIAGNOSIS — R109 Unspecified abdominal pain: Secondary | ICD-10-CM

## 2018-01-09 LAB — COMPREHENSIVE METABOLIC PANEL
ALBUMIN: 3.9 g/dL (ref 3.5–5.0)
ALT: 14 U/L (ref 0–44)
ANION GAP: 10 (ref 5–15)
AST: 24 U/L (ref 15–41)
Alkaline Phosphatase: 28 U/L — ABNORMAL LOW (ref 38–126)
BILIRUBIN TOTAL: 0.5 mg/dL (ref 0.3–1.2)
BUN: 19 mg/dL (ref 8–23)
CHLORIDE: 101 mmol/L (ref 98–111)
CO2: 26 mmol/L (ref 22–32)
Calcium: 9.8 mg/dL (ref 8.9–10.3)
Creatinine, Ser: 1.83 mg/dL — ABNORMAL HIGH (ref 0.61–1.24)
GFR calc Af Amer: 39 mL/min — ABNORMAL LOW (ref >60)
GFR calc non Af Amer: 33 mL/min — ABNORMAL LOW (ref >60)
GLUCOSE: 202 mg/dL — AB (ref 70–99)
POTASSIUM: 3.8 mmol/L (ref 3.5–5.1)
SODIUM: 137 mmol/L (ref 135–145)
Total Protein: 6.1 g/dL — ABNORMAL LOW (ref 6.5–8.1)

## 2018-01-09 LAB — CBC WITH DIFFERENTIAL/PLATELET
ABS IMMATURE GRANULOCYTES: 0.06 10*3/uL (ref 0.00–0.07)
Basophils Absolute: 0 10*3/uL (ref 0.0–0.1)
Basophils Relative: 1 %
Eosinophils Absolute: 0.2 10*3/uL (ref 0.0–0.5)
Eosinophils Relative: 2 %
HCT: 42.4 % (ref 39.0–52.0)
Hemoglobin: 13.1 g/dL (ref 13.0–17.0)
IMMATURE GRANULOCYTES: 1 %
LYMPHS PCT: 10 %
Lymphs Abs: 0.8 10*3/uL (ref 0.7–4.0)
MCH: 28.8 pg (ref 26.0–34.0)
MCHC: 30.9 g/dL (ref 30.0–36.0)
MCV: 93.2 fL (ref 80.0–100.0)
MONOS PCT: 6 %
Monocytes Absolute: 0.5 10*3/uL (ref 0.1–1.0)
NEUTROS ABS: 6.9 10*3/uL (ref 1.7–7.7)
NEUTROS PCT: 80 %
NRBC: 0 % (ref 0.0–0.2)
PLATELETS: 162 10*3/uL (ref 150–400)
RBC: 4.55 MIL/uL (ref 4.22–5.81)
RDW: 12.3 % (ref 11.5–15.5)
WBC: 8.5 10*3/uL (ref 4.0–10.5)

## 2018-01-09 LAB — I-STAT TROPONIN, ED
TROPONIN I, POC: 0.02 ng/mL (ref 0.00–0.08)
TROPONIN I, POC: 0.03 ng/mL (ref 0.00–0.08)

## 2018-01-09 LAB — LIPASE, BLOOD: Lipase: 30 U/L (ref 11–51)

## 2018-01-09 NOTE — Discharge Instructions (Addendum)
Blood sugar today was mildly elevated at 202.  Call Dr. Nyoka Cowden or your gastroenterologist today or Monday, 01/12/2018 to schedule a follow-up appointment

## 2018-01-09 NOTE — ED Triage Notes (Addendum)
Per EMS- pt reports he woke up with a jolting pain, pointing to his abdomen. He took 3 asprin prior to arrival and 2 nitro. Pt denies any chest pain but states this feels similar to his heart attack he had prior. Pt endorses feeling "winded" after movement today.

## 2018-01-09 NOTE — ED Notes (Signed)
Pt ambulated to RR .  

## 2018-01-09 NOTE — ED Provider Notes (Signed)
[facility] EMERGENCY DEPARTMENT Provider Note   CSN: [id number] Arrival date & time: 01/09/18  1779     History   Chief Complaint Chief Complaint  Patient presents with  . Chest Pain  Chief complaint :abdominal pain  HPI Gary [name] is a 84 y.o. male.  Complains of left-sided mid abdominal pain awakened him from sleep 5:30 AM today.  Symptoms lasted approximately 15 minutes.  Pain did not feel like "heart attack" he is had in the past.  He treated himself with 3 baby aspirins "in case it might be my heart".  EMS was called.  EMS treated patient with 1 sublingual nitroglycerin and 1 additional baby aspirin.  He is presently pain-free.  Associated symptoms include mild nausea which she has had intermittently from time to time over the past 1 year.  He also reports similar abdominal pain intermittently for the past 1 year.  He is presently pain-free, except for headache which started after he was administered nitroglycerin..  Other associated symptoms include dyspnea on exertion for the past 3 or 4 months.  No dyspnea now.  Nothing made symptoms better or worse.  No other associated symptoms  HPI  Past Medical History:  Diagnosis Date  . Adenomatous colon polyp    2010   . Anemia   . Anxiety   . Arthritis    "hips and lower back"  . Arthritis pain   . Blood transfusion   . Chest pain 03/29/11   2D Echo without contrast on 03/29/11 - EF= 55-60%.  . Cholecystitis   . Chronic back pain greater than 3 months duration   . Chronic kidney disease   . Colon polyps   . Complication of anesthesia    once slow to wake up  . Coronary angioplasty status 2011  . Coronary artery disease   . Diarrhea   . Diverticulosis   . Dyslipidemia   . GERD (gastroesophageal reflux disease)   . GI bleeding after 07/2009   "triggered by Plavix & ASA; from my diverticulosis"  . H/O Clostridium difficile infection   . H/O myocardial perfusion scan 04/04/11   For Pre-noncardiac  surgery - EF = 67%, no ischemia, and is considered low risk.  Marland Kitchen Heart attack ([place]) 07/2009   nonSTEMI with an occluded circumflex artery and collaterals.  Marland Kitchen Heart murmur   . Hemorrhoids   . History of shingles   . Hyperlipidemia   . Hypertension   . Kidney stones   . Lower extremity edema    Taking furosemide and it seems to be helping.  . Neuromuscular disorder ([place])   . Nonspecific chest pain 08/26/2012   Went to ED 08/26/12  . Stomach problems     Patient Active Problem List   Diagnosis Date Noted  . Snoring 10/03/2017  . Other fatigue 10/03/2017  . Pre-syncope 05/14/2016  . Murmur 05/14/2016  . CAD in native artery 10/31/2015  . History of MI (myocardial infarction) 10/31/2015  . Preoperative cardiovascular examination 12/26/2014  . History of colonic polyps 01/17/2014  . PAF (paroxysmal atrial fibrillation) ([place]) 08/14/2013  . Metabolic acidosis 39/04/90  . Acute renal failure ([place]) 08/13/2013  . Chronic kidney disease, stage 3 ([place]) 08/13/2013  . Anemia in chronic kidney disease 08/13/2013  . Hypokalemia 08/12/2013  . Acute blood loss anemia 08/09/2013  . Encephalopathy 08/09/2013  . Severe sepsis ([place]) 08/08/2013  . Hypotension, unspecified 08/07/2013  . Complex renal cyst 08/07/2013  . Renal mass, left 08/06/2013  .  Upper GI bleeding 08/05/2013  . Acute esophagitis 08/05/2013  . Urinary retention 08/04/2013  . S/P total hip arthroplasty 08/03/2013  . Degenerative joint disease (DJD) of hip 08/03/2013    Class: Chronic  . Internal hemorrhoids with other complication 54/62/7035  . C. difficile colitis 07/26/2012  . Ileus, postoperative ([place]) 07/24/2012  . Somnolence 07/24/2012  . Anxiety state 07/24/2012  . Chest pain- ER 08/26/12 03/30/2011  . Fever 03/28/2011  . Leucocytosis 03/28/2011  . CKD (chronic kidney disease), stage III ([place]) 03/28/2011  . Hyponatremia 02/02/2011  . BPH (benign prostatic hyperplasia) 02/01/2011  . Cholecystitis chronic, acute  01/26/2011  . Dyslipidemia 01/22/2011  . Abdominal bloating 08/07/2010  . Loose stools 08/07/2010  . Coronary atherosclerosis 10/26/2009  . Essential hypertension 06/07/2009  . DIVERTICULOSIS OF COLON 10/31/2008  . DIVERTICULOSIS, COLON, WITH HEMORRHAGE, surg 12/11  10/31/2008  . Personal history of colonic polyps 10/31/2008    Past Surgical History:  Procedure Laterality Date  . BACK SURGERY  1997  . CARDIAC CATHETERIZATION  08/09/2009   Circumflex 100% occluded with right-to-left collaterals, mild irregularities in the proximal and distal LAD and diagonal system, normal LV systolic function, and minor infrarenal irregularities, but no abdominal aortic aneurysm.  . CHOLECYSTECTOMY  04/12/2011   Procedure: CHOLECYSTECTOMY;  Surgeon: Adin Hector, MD;  Location: [facility];  Service: General;  Laterality: N/A;  . CHOLECYSTECTOMY  04/12/2011   Procedure: LAPAROSCOPIC CHOLECYSTECTOMY;  Surgeon: Adin Hector, MD;  Location: [facility];  Service: General;  Laterality: N/A;  converted to open  . COLON SURGERY     COLONOSCOPY  . ESOPHAGOGASTRODUODENOSCOPY N/A 08/05/2013   Procedure: ESOPHAGOGASTRODUODENOSCOPY (EGD);  Surgeon: Jerene Bears, MD;  Location: [facility];  Service: Gastroenterology;  Laterality: N/A;  . EYE SURGERY  1942   "right eye was crossed; they  corrected it"  . LUMBAR FUSION  07/22/2012  . POSTERIOR FUSION LUMBAR SPINE  08/1995   "bottom 4 vertebra"  . SUBTOTAL COLECTOMY  01/24/2010  . TOTAL HIP ARTHROPLASTY Right 08/03/2013   Procedure: TOTAL HIP ARTHROPLASTY ANTERIOR APPROACH;  Surgeon: Hessie Dibble, MD;  Location: [facility];  Service: Orthopedics;  Laterality: Right;        Home Medications    Prior to Admission medications   Medication Sig Start Date End Date Taking? Authorizing Provider  AMBULATORY NON FORMULARY MEDICATION Place 2 mLs rectally 3 (three) times daily. Medication Name: 5 % lidocaine ointment mixed with 0.125 nitroglycerin ointment 06/24/16   Esterwood,  Amy S, PA-C  ANDROGEL PUMP 20.25 MG/ACT (1.62%) GEL Apply topically daily.  01/15/14   [provider]  aspirin EC 81 MG tablet Take 81 mg by mouth daily.    [provider]  bisoprolol (ZEBETA) 5 MG tablet TAKE 1/2 TABLETS (2.5 MG TOTAL) BY MOUTH DAILY. 01/23/15   Pixie Casino, MD  citalopram (CELEXA) 40 MG tablet TAKE 1 TABLET EVERY DAY 11/09/13   Hilty, Nadean Corwin, MD  doxazosin (CARDURA) 8 MG tablet Take 4 mg by mouth at bedtime.     [provider]  fluticasone (FLONASE) 50 MCG/ACT nasal spray Place 2 sprays into the nose daily.     [provider]  furosemide (LASIX) 20 MG tablet Take 1 tablet (20 mg total) by mouth daily as needed. 05/14/16   Hilty, Nadean Corwin, MD  gabapentin (NEURONTIN) 400 MG capsule Take 400 mg by mouth 3 (three) times daily.     [provider]  LORazepam (ATIVAN) 0.5 MG tablet Take 0.25  mg by mouth at bedtime.     [provider]  nitroGLYCERIN (NITROSTAT) 0.4 MG SL tablet PLACE 1 TABLET UNDER THE TONGUE EVERY 5 MINUTES AS NEEDED FOR CHEST PAIN FOR 3 DOSES 10/25/16   Hilty, Nadean Corwin, MD  simvastatin (ZOCOR) 40 MG tablet TAKE 1 TABLET BY MOUTH AT BEDTIME 03/18/16   Hilty, Nadean Corwin, MD  Tamsulosin HCl (FLOMAX) 0.4 MG CAPS Take 0.4 mg by mouth daily after supper. 02/04/11   Charlynne Cousins, MD  traMADol-acetaminophen (ULTRACET) 37.5-325 MG per tablet Take 2 tablets by mouth 4 (four) times daily as needed for moderate pain.  09/23/12   [provider]    Family History Family History  Problem Relation Age of Onset  . Heart disease Father   . Stroke Father   . Pancreatic cancer Mother   . Diverticulosis Sister        x4  . Colon cancer Neg Hx     Social History Social History   Tobacco Use  . Smoking status: Never Smoker  . Smokeless tobacco: Never Used  Substance Use Topics  . Alcohol use: No  . Drug use: No     Allergies   Naproxen; Clopidogrel bisulfate; Sulfonamide derivatives; Other;  Plavix [clopidogrel bisulfate]; Latex; and Tape   Review of Systems Review of Systems  Cardiovascular: Negative for chest pain.  Gastrointestinal: Positive for abdominal pain and nausea.  Neurological: Positive for headaches.     Physical Exam Updated Vital Signs BP (!) 148/68   Pulse 64   Temp 98.6 F (37 C)   Resp (!) 22   SpO2 97%   Physical Exam  Constitutional: He appears well-developed and well-nourished.  HENT:  Head: Normocephalic and atraumatic.  Eyes: Pupils are equal, round, and reactive to light. Conjunctivae are normal.  Neck: Neck supple. No tracheal deviation present. No thyromegaly present.  Cardiovascular: Normal rate and regular rhythm.  No murmur heard. Pulmonary/Chest: Effort normal and breath sounds normal.  Abdominal: Soft. Bowel sounds are normal. He exhibits no distension. There is no tenderness.  Musculoskeletal: Normal range of motion. He exhibits no edema or tenderness.  Neurological: He is alert. Coordination normal.  Skin: Skin is warm and dry. No rash noted.  Psychiatric: He has a normal mood and affect.  Nursing note and vitals reviewed.    ED Treatments / Results  Labs (all labs ordered are listed, but only abnormal results are displayed) Labs Reviewed  COMPREHENSIVE METABOLIC PANEL  LIPASE, BLOOD  CBC WITH DIFFERENTIAL/PLATELET  I-STAT TROPONIN, ED    EKG EKG Interpretation  Date/Time:  Friday January 09 2018 08:16:36 EST Ventricular Rate:  66 PR Interval:    QRS Duration: 102 QT Interval:  417 QTC Calculation: 437 R Axis:   14 Text Interpretation:  Sinus rhythm RSR' in V1 or V2, right VCD or RVH No significant change since last tracing Confirmed by Orlie Dakin [phone]) on 01/09/2018 8:19:41 AM   Radiology No results found.  Procedures Procedures (including critical care time)  Medications Ordered in ED Medications - No data to display   Initial Impression / Assessment and Plan / ED Course  I have reviewed  the triage vital signs and the nursing notes.  Pertinent labs & imaging results that were available during my care of the patient were reviewed by me and considered in my medical decision making (see chart for details).     9:05 AM patient asymptomatic and resting comfortably. 1230 p.m. patient remains asymptomatic.Marland Kitchen  Plan discharge to  home.  Follow-up with PMD Dr. Nyoka Cowden gastroenterologist whom he seen in the past  Work remarkable for mild hyperglycemia and renal insufficiency, unchanged from April 2017. Results for orders placed or performed during the hospital encounter of 01/09/18  Comprehensive metabolic panel  Result Value Ref Range   Sodium 137 135 - 145 mmol/L   Potassium 3.8 3.5 - 5.1 mmol/L   Chloride 101 98 - 111 mmol/L   CO2 26 22 - 32 mmol/L   Glucose, Bld 202 (H) 70 - 99 mg/dL   BUN 19 8 - 23 mg/dL   Creatinine, Ser 1.83 (H) 0.61 - 1.24 mg/dL   Calcium 9.8 8.9 - 10.3 mg/dL   Total Protein 6.1 (L) 6.5 - 8.1 g/dL   Albumin 3.9 3.5 - 5.0 g/dL   AST 24 15 - 41 U/L   ALT 14 0 - 44 U/L   Alkaline Phosphatase 28 (L) 38 - 126 U/L   Total Bilirubin 0.5 0.3 - 1.2 mg/dL   GFR calc non Af Amer 33 (L) >60 mL/min   GFR calc Af Amer 39 (L) >60 mL/min   Anion gap 10 5 - 15  Lipase, blood  Result Value Ref Range   Lipase 30 11 - 51 U/L  CBC with Differential/Platelet  Result Value Ref Range   WBC 8.5 4.0 - 10.5 K/uL   RBC 4.55 4.22 - 5.81 MIL/uL   Hemoglobin 13.1 13.0 - 17.0 g/dL   HCT 42.4 39.0 - 52.0 %   MCV 93.2 80.0 - 100.0 fL   MCH 28.8 26.0 - 34.0 pg   MCHC 30.9 30.0 - 36.0 g/dL   RDW 12.3 11.5 - 15.5 %   Platelets 162 150 - 400 K/uL   nRBC 0.0 0.0 - 0.2 %   Neutrophils Relative % 80 %   Neutro Abs 6.9 1.7 - 7.7 K/uL   Lymphocytes Relative 10 %   Lymphs Abs 0.8 0.7 - 4.0 K/uL   Monocytes Relative 6 %   Monocytes Absolute 0.5 0.1 - 1.0 K/uL   Eosinophils Relative 2 %   Eosinophils Absolute 0.2 0.0 - 0.5 K/uL   Basophils Relative 1 %   Basophils Absolute 0.0 0.0  - 0.1 K/uL   Immature Granulocytes 1 %   Abs Immature Granulocytes 0.06 0.00 - 0.07 K/uL  I-stat troponin, ED  Result Value Ref Range   Troponin i, poc 0.02 0.00 - 0.08 ng/mL   Comment 3          I-stat troponin, ED  Result Value Ref Range   Troponin i, poc 0.03 0.00 - 0.08 ng/mL   Comment 3           No results found. Final Clinical Impressions(s) / ED Diagnoses  Diagnoses #1 left-sided abdominal pain #2 hyperglycemia #3 chronic renal insufficiency Final diagnoses:  None    ED Discharge Orders    None       Orlie Dakin, MD 01/09/18 1240

## 2018-01-16 ENCOUNTER — Encounter: Payer: Self-pay | Admitting: Gastroenterology

## 2018-01-16 ENCOUNTER — Ambulatory Visit: Payer: Medicare Other | Admitting: Gastroenterology

## 2018-01-16 VITALS — BP 132/70 | HR 68 | Ht 65.0 in | Wt 213.0 lb

## 2018-01-16 DIAGNOSIS — R110 Nausea: Secondary | ICD-10-CM

## 2018-01-16 DIAGNOSIS — R1032 Left lower quadrant pain: Secondary | ICD-10-CM

## 2018-01-16 NOTE — Patient Instructions (Signed)
If you are age 82 or older, your body mass index should be between 23-30. Your Body mass index is 35.45 kg/m. If this is out of the aforementioned range listed, please consider follow up with your Primary Care Provider.  If you are age 42 or younger, your body mass index should be between 19-25. Your Body mass index is 35.45 kg/m. If this is out of the aformentioned range listed, please consider follow up with your Primary Care Provider.   Follow up as needed.   It was a pleasure to see you today!  Dr. Loletha Carrow

## 2018-01-16 NOTE — Progress Notes (Signed)
[facility] Progress Note  Chief Complaint: Left lower quadrant pain  Subjective  History:   I last saw Gary [name] 10/21/2017 for office follow-up related to years of chronic left lower quadrant pain that had previously been evaluated by Dr. Deatra Ina.  Gary [name] has had a subtotal colectomy in 2011 for recurrent diverticular bleeding.  He also had bleeding prolapsed internal hemorrhoids have undergone banding, most recently May 2018. He also has chronic nausea that typically occurs in the morning and is brief.  It gets better when he has some diet soda and crackers.  He again declined need for nausea medicine.  Gary [name] went to the emergency department on 01/09/2018 for abdominal pain that woke him from sleep in the early morning and lasted about 15 minutes.  He took several aspirin and some nitroglycerin.  The pain did resolve by the time EMS brought into the hospital.  As before, Gary [name] is a somewhat rambling and tangential historian.  It is difficult to get a clear description of his symptoms on the day he went to the ED.  He had pain that may have been in the left abdomen possibly left lower chest, he felt like his heart was racing in his ears ringing and he was  fatigued. He has had no bleeding.  His bowel habits have always been frequent since the subtotal colectomy and have not changed recently.   Review of systems:  Chronic back pain and arthralgias Fatigue Anxiety Memory trouble  The patient's Past Medical, Family and Social History were reviewed and are on file in the EMR.  Objective:  Med list reviewed  Current Outpatient Medications:  .  AMBULATORY NON FORMULARY MEDICATION, Place 2 mLs rectally 3 (three) times daily. Medication Name: 5 % lidocaine ointment mixed with 0.125 nitroglycerin ointment, Disp: 30 g, Rfl: 0 .  ANDROGEL PUMP 20.25 MG/ACT (1.62%) GEL, Apply 1 application topically as needed (for low level). , Disp: , Rfl:  .  aspirin EC 81 MG tablet, Take 81 mg by mouth  daily., Disp: , Rfl:  .  aspirin EC 81 MG tablet, Take 81 mg by mouth daily., Disp: , Rfl:  .  bisoprolol (ZEBETA) 5 MG tablet, TAKE 1/2 TABLETS (2.5 MG TOTAL) BY MOUTH DAILY. (Patient taking differently: Take 2.5 mg by mouth daily. ), Disp: 45 tablet, Rfl: 3 .  citalopram (CELEXA) 40 MG tablet, TAKE 1 TABLET EVERY DAY (Patient taking differently: Take 40 mg by mouth daily. ), Disp: 90 tablet, Rfl: 2 .  doxazosin (CARDURA) 8 MG tablet, Take 4 mg by mouth at bedtime. , Disp: , Rfl:  .  fluticasone (FLONASE) 50 MCG/ACT nasal spray, Place 2 sprays into the nose daily. , Disp: , Rfl:  .  furosemide (LASIX) 20 MG tablet, Take 1 tablet (20 mg total) by mouth daily as needed. (Patient taking differently: Take 20 mg by mouth every Monday, Wednesday, and Friday. ), Disp: 90 tablet, Rfl: 1 .  gabapentin (NEURONTIN) 400 MG capsule, Take 400 mg by mouth 3 (three) times daily. , Disp: , Rfl:  .  LORazepam (ATIVAN) 0.5 MG tablet, Take 0.25 mg by mouth as needed. , Disp: , Rfl:  .  nitroGLYCERIN (NITROSTAT) 0.4 MG SL tablet, PLACE 1 TABLET UNDER THE TONGUE EVERY 5 MINUTES AS NEEDED FOR CHEST PAIN FOR 3 DOSES (Patient taking differently: Place 0.4 mg under the tongue every 5 (five) minutes as needed for chest pain. ), Disp: 25 tablet, Rfl: 0 .  simvastatin (ZOCOR) 40 MG tablet,  TAKE 1 TABLET BY MOUTH AT BEDTIME (Patient taking differently: Take 40 mg by mouth daily at 6 PM. ), Disp: 90 tablet, Rfl: 2 .  Tamsulosin HCl (FLOMAX) 0.4 MG CAPS, Take 0.4 mg by mouth daily after supper., Disp: , Rfl:  .  traMADol-acetaminophen (ULTRACET) 37.5-325 MG per tablet, Take 2 tablets by mouth 3 (three) times daily. , Disp: , Rfl:    Vital signs in last 24 hrs: Vitals:   01/16/18 1322  BP: 132/70  Pulse: 68  SpO2: 98%    Physical Exam  He is well-appearing, pleasant and conversational, some difficulty with word retrieval and recalling events clearly.  HEENT: sclera anicteric, oral mucosa moist without lesions  Neck:  supple, no thyromegaly, JVD or lymphadenopathy  Cardiac: RRR without murmurs, S1S2 heard, no peripheral edema  Pulm: clear to auscultation bilaterally, normal RR and effort noted  Abdomen: soft, no tenderness, with active bowel sounds. No guarding or palpable hepatosplenomegaly.  midline scar  Skin; warm and dry, no jaundice or rash  Recent Labs:  CBC Latest Ref Rng & Units 01/09/2018 06/28/2016 06/01/2015  WBC 4.0 - 10.5 K/uL 8.5 - 5.6  Hemoglobin 13.0 - 17.0 g/dL 13.1 14.5 12.0(L)  Hematocrit 39.0 - 52.0 % 42.4 42.8 36.6(L)  Platelets 150 - 400 K/uL 162 - 121(L)   CMP Latest Ref Rng & Units 01/09/2018 06/01/2015 08/18/2013  Glucose 70 - 99 mg/dL 202(H) 145(H) 136(H)  BUN 8 - 23 mg/dL 19 23(H) 23  Creatinine 0.61 - 1.24 mg/dL 1.83(H) 1.84(H) 1.71(H)  Sodium 135 - 145 mmol/L 137 141 137  Potassium 3.5 - 5.1 mmol/L 3.8 4.3 3.8  Chloride 98 - 111 mmol/L 101 106 101  CO2 22 - 32 mmol/L 26 26 20   Calcium 8.9 - 10.3 mg/dL 9.8 9.0 8.0(L)  Total Protein 6.5 - 8.1 g/dL 6.1(L) 5.9(L) 5.6(L)  Total Bilirubin 0.3 - 1.2 mg/dL 0.5 0.6 0.5  Alkaline Phos 38 - 126 U/L 28(L) 31(L) 74  AST 15 - 41 U/L 24 24 29   ALT 0 - 44 U/L 14 17 17    Last echocardiogram May 2018, normal LVEF.  EKG NSR without ischemic changes  Radiologic studies:  Last CT abdomen and pelvis April 2017 showing no normality of the area of the ileosigmoid anastomosis.  @ASSESSMENTPLANBEGIN @ Assessment: Encounter Diagnoses  Name Primary?  Marland Kitchen LLQ abdominal pain Yes  . Nausea without vomiting    Years of chronic left-sided abdominal discomfort difficult to characterize.  It is of unclear relation to the recent episode that brought him to the ED.  He was not having acute coronary syndrome, but I wonder if it still could have been angina.  He is due to see primary care next week, and I wonder what they will think about him taking nitroglycerin if he has further episodes like this in the future.  I do not think sigmoidoscopy or  further digestive tract testing is likely to be revealing at this point. I have done my best to reassure him.    Total time 20 minutes, over half spent face-to-face with patient in counseling and coordination of care.   Nelida Meuse III

## 2018-01-28 ENCOUNTER — Telehealth: Payer: Self-pay | Admitting: Internal Medicine

## 2018-01-28 ENCOUNTER — Other Ambulatory Visit: Payer: Self-pay | Admitting: Internal Medicine

## 2018-01-28 DIAGNOSIS — I10 Essential (primary) hypertension: Secondary | ICD-10-CM

## 2018-01-28 DIAGNOSIS — I480 Paroxysmal atrial fibrillation: Secondary | ICD-10-CM

## 2018-01-28 DIAGNOSIS — R0683 Snoring: Secondary | ICD-10-CM

## 2018-01-28 DIAGNOSIS — G478 Other sleep disorders: Secondary | ICD-10-CM

## 2018-01-28 DIAGNOSIS — R5383 Other fatigue: Secondary | ICD-10-CM

## 2018-01-28 NOTE — Telephone Encounter (Signed)
New Message:      Please call, pt says he have been having some problems with his stomach. He wants to discuss this, because he is not sure if it is stomach or heart.

## 2018-01-28 NOTE — Telephone Encounter (Signed)
Per wife, patient has been having episodes where he is warm & tingling all over especially in his hands and says it occurs mostly at night. Wife says that during these episodes, patients SPB is in the 200's. Spoke with patient and he added that he also get SOB during the episodes. Per wife and patient, EMS was called out to the home and patient has been seen in the ED for this problem. No c/o chest pain or dizziness. Does c/o pain in LLQ abdominal pain. Saw GI doctor and PCP for this problem and was told that everything was normal after lab work was done. Patient advised to contact his PCP again since he continues to have these symptoms. Patient is requesting that this message be sent to Dr. Debara Pickett.  Patient said that he has changed pharmacies and is getting bisoprolol from [facility]  because [facility] didn't  Have bis[facility]rolol available. Patient uses a pill splitter and says the pill crumbles. Advised to request pharmacy to split this medication for him or he could have it filled at a different pharmacy. Verbalized understanding of plan.

## 2018-01-28 NOTE — Telephone Encounter (Signed)
New Message        Patient returned your call and would like a call back

## 2018-01-28 NOTE — Telephone Encounter (Signed)
See previous phone note for details 

## 2018-03-16 ENCOUNTER — Encounter (HOSPITAL_BASED_OUTPATIENT_CLINIC_OR_DEPARTMENT_OTHER): Payer: Medicare Other

## 2018-03-26 ENCOUNTER — Encounter: Payer: Self-pay | Admitting: Nurse Practitioner

## 2018-03-27 ENCOUNTER — Ambulatory Visit: Payer: Self-pay | Admitting: Nurse Practitioner

## 2018-04-02 ENCOUNTER — Ambulatory Visit: Payer: Medicare Other | Admitting: Internal Medicine

## 2018-04-15 ENCOUNTER — Inpatient Hospital Stay (HOSPITAL_COMMUNITY)
Admission: EM | Admit: 2018-04-15 | Discharge: 2018-04-18 | DRG: 244 | Disposition: A | Discharge status: Home / Self Care | Payer: Medicare Other | Attending: Internal Medicine | Admitting: Internal Medicine

## 2018-04-15 DIAGNOSIS — N400 Enlarged prostate without lower urinary tract symptoms: Secondary | ICD-10-CM | POA: Diagnosis present

## 2018-04-15 DIAGNOSIS — Z91040 Latex allergy status: Secondary | ICD-10-CM

## 2018-04-15 DIAGNOSIS — F329 Major depressive disorder, single episode, unspecified: Secondary | ICD-10-CM | POA: Diagnosis not present

## 2018-04-15 DIAGNOSIS — K210 Gastro-esophageal reflux disease with esophagitis: Secondary | ICD-10-CM | POA: Diagnosis not present

## 2018-04-15 DIAGNOSIS — I442 Atrioventricular block, complete: Principal | ICD-10-CM | POA: Diagnosis present

## 2018-04-15 DIAGNOSIS — I480 Paroxysmal atrial fibrillation: Secondary | ICD-10-CM | POA: Diagnosis not present

## 2018-04-15 DIAGNOSIS — I455 Other specified heart block: Secondary | ICD-10-CM | POA: Diagnosis present

## 2018-04-15 DIAGNOSIS — Z888 Allergy status to other drugs, medicaments and biological substances status: Secondary | ICD-10-CM | POA: Diagnosis not present

## 2018-04-15 DIAGNOSIS — I272 Pulmonary hypertension, unspecified: Secondary | ICD-10-CM | POA: Diagnosis present

## 2018-04-15 DIAGNOSIS — R55 Syncope and collapse: Secondary | ICD-10-CM | POA: Diagnosis not present

## 2018-04-15 DIAGNOSIS — I459 Conduction disorder, unspecified: Secondary | ICD-10-CM

## 2018-04-15 DIAGNOSIS — I2510 Atherosclerotic heart disease of native coronary artery without angina pectoris: Secondary | ICD-10-CM | POA: Diagnosis not present

## 2018-04-15 DIAGNOSIS — Z981 Arthrodesis status: Secondary | ICD-10-CM

## 2018-04-15 DIAGNOSIS — Z886 Allergy status to analgesic agent status: Secondary | ICD-10-CM | POA: Diagnosis not present

## 2018-04-15 DIAGNOSIS — Z7982 Long term (current) use of aspirin: Secondary | ICD-10-CM

## 2018-04-15 DIAGNOSIS — I252 Old myocardial infarction: Secondary | ICD-10-CM | POA: Diagnosis not present

## 2018-04-15 DIAGNOSIS — I10 Essential (primary) hypertension: Secondary | ICD-10-CM | POA: Diagnosis present

## 2018-04-15 DIAGNOSIS — I129 Hypertensive chronic kidney disease with stage 1 through stage 4 chronic kidney disease, or unspecified chronic kidney disease: Secondary | ICD-10-CM | POA: Diagnosis not present

## 2018-04-15 DIAGNOSIS — E785 Hyperlipidemia, unspecified: Secondary | ICD-10-CM | POA: Diagnosis present

## 2018-04-15 DIAGNOSIS — Z885 Allergy status to narcotic agent status: Secondary | ICD-10-CM | POA: Diagnosis not present

## 2018-04-15 DIAGNOSIS — Z96641 Presence of right artificial hip joint: Secondary | ICD-10-CM | POA: Diagnosis present

## 2018-04-15 DIAGNOSIS — Z882 Allergy status to sulfonamides status: Secondary | ICD-10-CM | POA: Diagnosis not present

## 2018-04-15 DIAGNOSIS — Z9049 Acquired absence of other specified parts of digestive tract: Secondary | ICD-10-CM

## 2018-04-15 DIAGNOSIS — D631 Anemia in chronic kidney disease: Secondary | ICD-10-CM | POA: Diagnosis not present

## 2018-04-15 DIAGNOSIS — Z7951 Long term (current) use of inhaled steroids: Secondary | ICD-10-CM

## 2018-04-15 DIAGNOSIS — Z66 Do not resuscitate: Secondary | ICD-10-CM | POA: Diagnosis not present

## 2018-04-15 DIAGNOSIS — Z8249 Family history of ischemic heart disease and other diseases of the circulatory system: Secondary | ICD-10-CM

## 2018-04-15 DIAGNOSIS — G894 Chronic pain syndrome: Secondary | ICD-10-CM | POA: Diagnosis present

## 2018-04-15 DIAGNOSIS — Z823 Family history of stroke: Secondary | ICD-10-CM

## 2018-04-15 DIAGNOSIS — I700 Atherosclerosis of aorta: Secondary | ICD-10-CM | POA: Diagnosis present

## 2018-04-15 DIAGNOSIS — N183 Chronic kidney disease, stage 3 unspecified: Secondary | ICD-10-CM | POA: Diagnosis present

## 2018-04-15 DIAGNOSIS — Z79899 Other long term (current) drug therapy: Secondary | ICD-10-CM

## 2018-04-15 DIAGNOSIS — F418 Other specified anxiety disorders: Secondary | ICD-10-CM | POA: Diagnosis not present

## 2018-04-15 DIAGNOSIS — N289 Disorder of kidney and ureter, unspecified: Secondary | ICD-10-CM

## 2018-04-15 DIAGNOSIS — R7989 Other specified abnormal findings of blood chemistry: Secondary | ICD-10-CM

## 2018-04-15 DIAGNOSIS — Z959 Presence of cardiac and vascular implant and graft, unspecified: Secondary | ICD-10-CM

## 2018-04-15 DIAGNOSIS — F411 Generalized anxiety disorder: Secondary | ICD-10-CM | POA: Diagnosis present

## 2018-04-15 HISTORY — DX: Syncope and collapse: R55

## 2018-04-16 ENCOUNTER — Other Ambulatory Visit: Payer: Self-pay

## 2018-04-16 ENCOUNTER — Inpatient Hospital Stay (HOSPITAL_COMMUNITY): Payer: Medicare Other

## 2018-04-16 ENCOUNTER — Encounter (HOSPITAL_COMMUNITY): Payer: Self-pay | Admitting: Emergency Medicine

## 2018-04-16 ENCOUNTER — Emergency Department (HOSPITAL_COMMUNITY): Payer: Medicare Other

## 2018-04-16 DIAGNOSIS — I129 Hypertensive chronic kidney disease with stage 1 through stage 4 chronic kidney disease, or unspecified chronic kidney disease: Secondary | ICD-10-CM | POA: Diagnosis not present

## 2018-04-16 DIAGNOSIS — R55 Syncope and collapse: Secondary | ICD-10-CM

## 2018-04-16 DIAGNOSIS — K210 Gastro-esophageal reflux disease with esophagitis: Secondary | ICD-10-CM | POA: Diagnosis not present

## 2018-04-16 DIAGNOSIS — Z885 Allergy status to narcotic agent status: Secondary | ICD-10-CM | POA: Diagnosis not present

## 2018-04-16 DIAGNOSIS — Z7951 Long term (current) use of inhaled steroids: Secondary | ICD-10-CM | POA: Diagnosis not present

## 2018-04-16 DIAGNOSIS — I272 Pulmonary hypertension, unspecified: Secondary | ICD-10-CM | POA: Diagnosis not present

## 2018-04-16 DIAGNOSIS — F418 Other specified anxiety disorders: Secondary | ICD-10-CM | POA: Diagnosis not present

## 2018-04-16 DIAGNOSIS — Z7982 Long term (current) use of aspirin: Secondary | ICD-10-CM | POA: Diagnosis not present

## 2018-04-16 DIAGNOSIS — Z886 Allergy status to analgesic agent status: Secondary | ICD-10-CM | POA: Diagnosis not present

## 2018-04-16 DIAGNOSIS — I442 Atrioventricular block, complete: Secondary | ICD-10-CM | POA: Diagnosis present

## 2018-04-16 DIAGNOSIS — I700 Atherosclerosis of aorta: Secondary | ICD-10-CM | POA: Diagnosis not present

## 2018-04-16 DIAGNOSIS — Z888 Allergy status to other drugs, medicaments and biological substances status: Secondary | ICD-10-CM | POA: Diagnosis not present

## 2018-04-16 DIAGNOSIS — I455 Other specified heart block: Secondary | ICD-10-CM | POA: Diagnosis present

## 2018-04-16 DIAGNOSIS — I252 Old myocardial infarction: Secondary | ICD-10-CM | POA: Diagnosis not present

## 2018-04-16 DIAGNOSIS — Z79899 Other long term (current) drug therapy: Secondary | ICD-10-CM | POA: Diagnosis not present

## 2018-04-16 DIAGNOSIS — G894 Chronic pain syndrome: Secondary | ICD-10-CM | POA: Diagnosis not present

## 2018-04-16 DIAGNOSIS — F329 Major depressive disorder, single episode, unspecified: Secondary | ICD-10-CM | POA: Diagnosis not present

## 2018-04-16 DIAGNOSIS — E785 Hyperlipidemia, unspecified: Secondary | ICD-10-CM | POA: Diagnosis not present

## 2018-04-16 DIAGNOSIS — N183 Chronic kidney disease, stage 3 (moderate): Secondary | ICD-10-CM | POA: Diagnosis not present

## 2018-04-16 DIAGNOSIS — D631 Anemia in chronic kidney disease: Secondary | ICD-10-CM | POA: Diagnosis not present

## 2018-04-16 DIAGNOSIS — Z66 Do not resuscitate: Secondary | ICD-10-CM | POA: Diagnosis not present

## 2018-04-16 DIAGNOSIS — Z882 Allergy status to sulfonamides status: Secondary | ICD-10-CM | POA: Diagnosis not present

## 2018-04-16 DIAGNOSIS — I480 Paroxysmal atrial fibrillation: Secondary | ICD-10-CM | POA: Diagnosis not present

## 2018-04-16 DIAGNOSIS — I2510 Atherosclerotic heart disease of native coronary artery without angina pectoris: Secondary | ICD-10-CM | POA: Diagnosis not present

## 2018-04-16 DIAGNOSIS — N400 Enlarged prostate without lower urinary tract symptoms: Secondary | ICD-10-CM | POA: Diagnosis not present

## 2018-04-16 HISTORY — DX: Syncope and collapse: R55

## 2018-04-16 LAB — COMPREHENSIVE METABOLIC PANEL
ALBUMIN: 4 g/dL (ref 3.5–5.0)
ALT: 15 U/L (ref 0–44)
ALT: 18 U/L (ref 0–44)
AST: 24 U/L (ref 15–41)
AST: 29 U/L (ref 15–41)
Albumin: 3.6 g/dL (ref 3.5–5.0)
Alkaline Phosphatase: 30 U/L — ABNORMAL LOW (ref 38–126)
Alkaline Phosphatase: 32 U/L — ABNORMAL LOW (ref 38–126)
Anion gap: 11 (ref 5–15)
Anion gap: 14 (ref 5–15)
BUN: 23 mg/dL (ref 8–23)
BUN: 22 mg/dL (ref 8–23)
CHLORIDE: 102 mmol/L (ref 98–111)
CO2: 21 mmol/L — AB (ref 22–32)
CO2: 22 mmol/L (ref 22–32)
CREATININE: 1.76 mg/dL — AB (ref 0.61–1.24)
Calcium: 9.1 mg/dL (ref 8.9–10.3)
Calcium: 9.2 mg/dL (ref 8.9–10.3)
Chloride: 103 mmol/L (ref 98–111)
Creatinine, Ser: 1.8 mg/dL — ABNORMAL HIGH (ref 0.61–1.24)
GFR calc Af Amer: 41 mL/min — ABNORMAL LOW (ref >60)
GFR calc Af Amer: 39 mL/min — ABNORMAL LOW (ref >60)
GFR calc non Af Amer: 35 mL/min — ABNORMAL LOW (ref >60)
GFR calc non Af Amer: 34 mL/min — ABNORMAL LOW (ref >60)
Glucose, Bld: 180 mg/dL — ABNORMAL HIGH (ref 70–99)
Glucose, Bld: 205 mg/dL — ABNORMAL HIGH (ref 70–99)
POTASSIUM: 4.9 mmol/L (ref 3.5–5.1)
Potassium: 4.8 mmol/L (ref 3.5–5.1)
Sodium: 136 mmol/L (ref 135–145)
Sodium: 137 mmol/L (ref 135–145)
Total Bilirubin: 0.4 mg/dL (ref 0.3–1.2)
Total Bilirubin: 0.6 mg/dL (ref 0.3–1.2)
Total Protein: 6.2 g/dL — ABNORMAL LOW (ref 6.5–8.1)
Total Protein: 6.9 g/dL (ref 6.5–8.1)

## 2018-04-16 LAB — CBC WITH DIFFERENTIAL/PLATELET
Abs Immature Granulocytes: 0.05 10*3/uL (ref 0.00–0.07)
Basophils Absolute: 0 10*3/uL (ref 0.0–0.1)
Basophils Relative: 0 %
Eosinophils Absolute: 0 10*3/uL (ref 0.0–0.5)
Eosinophils Relative: 0 %
HCT: 43.5 % (ref 39.0–52.0)
Hemoglobin: 13.9 g/dL (ref 13.0–17.0)
IMMATURE GRANULOCYTES: 1 %
LYMPHS PCT: 9 %
Lymphs Abs: 0.5 10*3/uL — ABNORMAL LOW (ref 0.7–4.0)
MCH: 29.3 pg (ref 26.0–34.0)
MCHC: 32 g/dL (ref 30.0–36.0)
MCV: 91.8 fL (ref 80.0–100.0)
Monocytes Absolute: 0.1 10*3/uL (ref 0.1–1.0)
Monocytes Relative: 1 %
Neutro Abs: 4.9 10*3/uL (ref 1.7–7.7)
Neutrophils Relative %: 89 %
Platelets: 154 10*3/uL (ref 150–400)
RBC: 4.74 MIL/uL (ref 4.22–5.81)
RDW: 12.4 % (ref 11.5–15.5)
WBC: 5.5 10*3/uL (ref 4.0–10.5)
nRBC: 0 % (ref 0.0–0.2)

## 2018-04-16 LAB — LACTIC ACID, PLASMA
Lactic Acid, Venous: 2.2 mmol/L (ref 0.5–1.9)
Lactic Acid, Venous: 2.6 mmol/L (ref 0.5–1.9)
Lactic Acid, Venous: 2.1 mmol/L (ref 0.5–1.9)

## 2018-04-16 LAB — URINALYSIS, ROUTINE W REFLEX MICROSCOPIC
BACTERIA UA: NONE SEEN
Bilirubin Urine: NEGATIVE
Glucose, UA: 50 mg/dL — AB
Hgb urine dipstick: NEGATIVE
Ketones, ur: NEGATIVE mg/dL
Leukocytes,Ua: NEGATIVE
Nitrite: NEGATIVE
PROTEIN: 30 mg/dL — AB
SPECIFIC GRAVITY, URINE: 1.016 (ref 1.005–1.030)
pH: 8 (ref 5.0–8.0)

## 2018-04-16 LAB — TROPONIN I: Troponin I: <0.03 ng/mL (ref <0.03)

## 2018-04-16 LAB — CK
Total CK: 349 U/L (ref 49–397)
Total CK: 375 U/L (ref 49–397)

## 2018-04-16 LAB — TSH: TSH: 0.899 u[IU]/mL (ref 0.350–4.500)

## 2018-04-16 LAB — CBG MONITORING, ED: GLUCOSE-CAPILLARY: 177 mg/dL — AB (ref 70–99)

## 2018-04-16 MED ORDER — ENOXAPARIN SODIUM 40 MG/0.4ML ~~LOC~~ SOLN
40.0000 mg | SUBCUTANEOUS | Status: DC
  Administered 2018-04-16: 40 mg via SUBCUTANEOUS
  Filled 2018-04-16: qty 0.4

## 2018-04-16 MED ORDER — TRAMADOL-ACETAMINOPHEN 37.5-325 MG PO TABS
1.0000 | ORAL_TABLET | Freq: Three times a day (TID) | ORAL | Status: DC | PRN
  Administered 2018-04-16 – 2018-04-18 (×3): 1 via ORAL
  Filled 2018-04-16 (×4): qty 1

## 2018-04-16 MED ORDER — GABAPENTIN 300 MG PO CAPS
300.0000 mg | ORAL_CAPSULE | Freq: Three times a day (TID) | ORAL | Status: DC
  Administered 2018-04-16 – 2018-04-18 (×6): 300 mg via ORAL
  Filled 2018-04-16 (×6): qty 1

## 2018-04-16 MED ORDER — ACETAMINOPHEN 325 MG PO TABS
650.0000 mg | ORAL_TABLET | Freq: Four times a day (QID) | ORAL | Status: DC | PRN
  Administered 2018-04-17 (×2): 650 mg via ORAL
  Filled 2018-04-16 (×2): qty 2

## 2018-04-16 MED ORDER — DOXAZOSIN MESYLATE 4 MG PO TABS
4.0000 mg | ORAL_TABLET | Freq: Every day | ORAL | Status: DC
  Administered 2018-04-16 – 2018-04-17 (×2): 4 mg via ORAL
  Filled 2018-04-16 (×3): qty 1

## 2018-04-16 MED ORDER — ACETAMINOPHEN 650 MG RE SUPP: 650.0000 mg | Freq: Four times a day (QID) | RECTAL | Status: DC | PRN

## 2018-04-16 MED ORDER — ONDANSETRON HCL 4 MG/2ML IJ SOLN: 4.0000 mg | Freq: Four times a day (QID) | INTRAMUSCULAR | Status: DC | PRN

## 2018-04-16 MED ORDER — SIMVASTATIN 20 MG PO TABS
40.0000 mg | ORAL_TABLET | Freq: Every day | ORAL | Status: DC
  Administered 2018-04-16 – 2018-04-17 (×2): 40 mg via ORAL
  Filled 2018-04-16 (×2): qty 2

## 2018-04-16 MED ORDER — ONDANSETRON HCL 4 MG PO TABS: 4.0000 mg | ORAL_TABLET | Freq: Four times a day (QID) | ORAL | Status: DC | PRN

## 2018-04-16 MED ORDER — ASPIRIN EC 81 MG PO TBEC
81.0000 mg | DELAYED_RELEASE_TABLET | Freq: Every day | ORAL | Status: DC
  Administered 2018-04-16 – 2018-04-18 (×3): 81 mg via ORAL
  Filled 2018-04-16 (×3): qty 1

## 2018-04-16 MED ORDER — TAMSULOSIN HCL 0.4 MG PO CAPS
0.4000 mg | ORAL_CAPSULE | Freq: Every day | ORAL | Status: DC
  Administered 2018-04-16 – 2018-04-17 (×2): 0.4 mg via ORAL
  Filled 2018-04-16 (×2): qty 1

## 2018-04-16 MED ORDER — PERFLUTREN LIPID MICROSPHERE
1.0000 mL | INTRAVENOUS | Status: AC | PRN
  Administered 2018-04-16: 4 mL via INTRAVENOUS
  Filled 2018-04-16 (×2): qty 10

## 2018-04-16 MED ORDER — ONDANSETRON HCL 4 MG/2ML IJ SOLN
4.0000 mg | Freq: Once | INTRAMUSCULAR | Status: AC
  Administered 2018-04-16: 4 mg via INTRAVENOUS
  Filled 2018-04-16: qty 2

## 2018-04-16 MED ORDER — CITALOPRAM HYDROBROMIDE 20 MG PO TABS
40.0000 mg | ORAL_TABLET | Freq: Every day | ORAL | Status: DC
  Administered 2018-04-16 – 2018-04-18 (×3): 40 mg via ORAL
  Filled 2018-04-16 (×3): qty 2

## 2018-04-16 MED ORDER — SODIUM CHLORIDE 0.9% FLUSH
3.0000 mL | Freq: Once | INTRAVENOUS | Status: AC
  Administered 2018-04-16: 3 mL via INTRAVENOUS

## 2018-04-16 NOTE — ED Notes (Signed)
Patient transported to X-ray 

## 2018-04-16 NOTE — Plan of Care (Signed)
  Problem: Education: Goal: Knowledge of General Education information will improve Description Including pain rating scale, medication(s)/side effects and non-pharmacologic comfort measures Outcome: Progressing   

## 2018-04-16 NOTE — ED Triage Notes (Signed)
BIB EMS from home. EMS called to home ~17min ago when pt had LOC after a BM. Pt refused transport at that time. ~56min later, pt noted by family to have episode of "shaking" and unresponsive for 71min. Per EMS, after shaking, pt opened eyes and was immediately responsive and oriented to baseline. No incontinence, tongue trauma, or other notable postictal period. No prior hx of seizures.

## 2018-04-16 NOTE — H&P (Signed)
[facility] History and Physical  ADONTE VANRIPER FUX:[id number] DOB: [birth year] DOA: 04/15/2018  Referring physician: EDP Dr. Roxanne Mins PCP: Pixie Casino, MD   Chief Complaint: Passed out  HPI: Gary [name] is a 85 y.o. male with past history of CAD, non-STEMI in 2011 cath noted occluded circumflex and collaterals on medical management, remote history of atrial fibrillation and palpitations, anxiety, chronic pain was in his usual state of health yesterday.  According to patient's wife he had a bowel movement subsequently had loss of consciousness getting off the commode, EMS was called patient refused transportation to the ED, 30 to 40 minutes later he was noted to be briefly unresponsive with some mild shakes, subsequently regained consciousness and was brought to the ED, initial work-up was unremarkable. -Subsequently patient had an 8-second sinus pause/asystole associated with loss of consciousness. -External pacer pads were placed -TRH was called for admission, of note patient is on low-dose bisoprolol 2.5 mg daily, last taken yesterday, no recent changes in medications.   Review of Systems: 14 system review is negative except per HPI  Past Medical History:  Diagnosis Date  . Adenomatous colon polyp    2010   . Anemia   . Anxiety   . Arthritis    "hips and lower back"  . Arthritis pain   . Blood transfusion   . Chest pain 03/29/11   2D Echo without contrast on 03/29/11 - EF= 55-60%.  . Cholecystitis   . Chronic back pain greater than 3 months duration   . Chronic kidney disease   . Colon polyps   . Complication of anesthesia    once slow to wake up  . Coronary angioplasty status 2011  . Coronary artery disease   . Diarrhea   . Diverticulosis   . Dyslipidemia   . GERD (gastroesophageal reflux disease)   . GI bleeding after 07/2009   "triggered by Plavix & ASA; from my diverticulosis"  . H/O Clostridium difficile infection   . H/O myocardial perfusion scan 04/04/11     For Pre-noncardiac surgery - EF = 67%, no ischemia, and is considered low risk.  Marland Kitchen Heart attack ([place]) 07/2009   nonSTEMI with an occluded circumflex artery and collaterals.  Marland Kitchen Heart murmur   . Hemorrhoids   . History of shingles   . Hyperlipidemia   . Hypertension   . Kidney stones   . Lower extremity edema    Taking furosemide and it seems to be helping.  . Neuromuscular disorder ([place])   . Nonspecific chest pain 08/26/2012   Went to ED 08/26/12  . Stomach problems    Past Surgical History:  Procedure Laterality Date  . BACK SURGERY  1997  . CARDIAC CATHETERIZATION  08/09/2009   Circumflex 100% occluded with right-to-left collaterals, mild irregularities in the proximal and distal LAD and diagonal system, normal LV systolic function, and minor infrarenal irregularities, but no abdominal aortic aneurysm.  . CHOLECYSTECTOMY  04/12/2011   Procedure: CHOLECYSTECTOMY;  Surgeon: Adin Hector, MD;  Location: [facility];  Service: General;  Laterality: N/A;  . CHOLECYSTECTOMY  04/12/2011   Procedure: LAPAROSCOPIC CHOLECYSTECTOMY;  Surgeon: Adin Hector, MD;  Location: [facility];  Service: General;  Laterality: N/A;  converted to open  . COLON SURGERY     COLONOSCOPY  . ESOPHAGOGASTRODUODENOSCOPY N/A 08/05/2013   Procedure: ESOPHAGOGASTRODUODENOSCOPY (EGD);  Surgeon: Jerene Bears, MD;  Location: [facility];  Service: Gastroenterology;  Laterality: N/A;  . EYE SURGERY  1942   "right  eye was crossed; they  corrected it"  . LUMBAR FUSION  07/22/2012  . POSTERIOR FUSION LUMBAR SPINE  08/1995   "bottom 4 vertebra"  . SUBTOTAL COLECTOMY  01/24/2010  . TOTAL HIP ARTHROPLASTY Right 08/03/2013   Procedure: TOTAL HIP ARTHROPLASTY ANTERIOR APPROACH;  Surgeon: Hessie Dibble, MD;  Location: [facility];  Service: Orthopedics;  Laterality: Right;   Social History:  reports that he has never smoked. He has never used smokeless tobacco. He reports that he does not drink alcohol or use drugs.  Allergies   Allergen Reactions  . Naproxen Rash  . Clopidogrel Bisulfate Other (See Comments)    H/O diverticulitis; prone to rectal bleeding.  Plavix complicated this.  jkl  . Sulfonamide Derivatives Hives  . Other Other (See Comments)    Narcotics-C. Diff  . Plavix [Clopidogrel Bisulfate]   . Latex Rash  . Tape Itching and Rash    Latex-Adhesive tape    Family History  Problem Relation Age of Onset  . Heart disease Father   . Stroke Father   . Pancreatic cancer Mother   . Diverticulosis Sister        x4  . Colon cancer Neg Hx    Prior to Admission medications   Medication Sig Start Date End Date Taking? Authorizing Provider  ANDROGEL PUMP 20.25 MG/ACT (1.62%) GEL Apply 1 application topically as needed (for low level).  01/15/14  Yes [provider]  aspirin EC 81 MG tablet Take 81 mg by mouth daily.   Yes [provider]  bisoprolol (ZEBETA) 5 MG tablet TAKE 1/2 TABLETS (2.5 MG TOTAL) BY MOUTH DAILY. Patient taking differently: Take 2.5 mg by mouth daily.  01/23/15  Yes Hilty, Nadean Corwin, MD  citalopram (CELEXA) 40 MG tablet TAKE 1 TABLET EVERY DAY Patient taking differently: Take 40 mg by mouth daily.  11/09/13  Yes Hilty, Nadean Corwin, MD  doxazosin (CARDURA) 8 MG tablet Take 4 mg by mouth at bedtime.    Yes [provider]  fluticasone (FLONASE) 50 MCG/ACT nasal spray Place 2 sprays into the nose daily.    Yes [provider]  furosemide (LASIX) 20 MG tablet Take 1 tablet (20 mg total) by mouth daily as needed. Patient taking differently: Take 20 mg by mouth every Monday, Wednesday, and Friday.  05/14/16  Yes Hilty, Nadean Corwin, MD  gabapentin (NEURONTIN) 400 MG capsule Take 400 mg by mouth 3 (three) times daily.    Yes [provider]  nitroGLYCERIN (NITROSTAT) 0.4 MG SL tablet PLACE 1 TABLET UNDER THE TONGUE EVERY 5 MINUTES AS NEEDED FOR CHEST PAIN FOR 3 DOSES Patient taking differently: Place 0.4 mg under the tongue every 5 (five) minutes as  needed for chest pain.  10/25/16  Yes Hilty, Nadean Corwin, MD  simvastatin (ZOCOR) 40 MG tablet TAKE 1 TABLET BY MOUTH AT BEDTIME Patient taking differently: Take 40 mg by mouth daily at 6 PM.  03/18/16  Yes Hilty, Nadean Corwin, MD  Tamsulosin HCl (FLOMAX) 0.4 MG CAPS Take 0.4 mg by mouth daily after supper. 02/04/11  Yes Charlynne Cousins, MD  traMADol-acetaminophen (ULTRACET) 37.5-325 MG per tablet Take 2 tablets by mouth 3 (three) times daily.  09/23/12  Yes [provider]  AMBULATORY NON FORMULARY MEDICATION Place 2 mLs rectally 3 (three) times daily. Medication Name: 5 % lidocaine ointment mixed with 0.125 nitroglycerin ointment Patient not taking: Reported on 04/16/2018 06/24/16   Alfredia Ferguson, PA-C   Physical Exam: Vitals:   04/16/18  0930 04/16/18 1000 04/16/18 1030 04/16/18 1100  BP: (!) 141/63 137/73 (!) 155/70 (!) 147/66  Pulse: 73     Resp: 15 17 16 18   Temp:      TempSrc:      SpO2: 95%     Weight:      Height:        Wt Readings from Last 3 Encounters:  04/16/18 95.3 kg  01/16/18 96.6 kg  10/21/17 95.3 kg    General: Chronically ill-appearing obese male, laying in bed, no distress, AAO x2 Eyes: PERRL, normal lids, irises & conjunctiva ENT: grossly normal hearing, lips & tongue Neck: no LAD, masses or thyromegaly Cardiovascular: S1-S2/regular rate rhythm Telemetry: SR, no arrhythmias  Respiratory: CTA bilaterally, no w/r/r. Normal respiratory effort. Abdomen: Soft nontender, bowel sounds present Skin: no rash or induration seen on limited exam Musculoskeletal: grossly normal tone BUE/BLE Psychiatric: Flat affect Neurologic: grossly non-focal.          Labs on Admission:  Basic Metabolic Panel: Recent Labs  Lab 04/16/18 0034 04/16/18 0539  NA 137 136  K 4.9 4.8  CL 102 103  CO2 21* 22  GLUCOSE 205* 180*  BUN 22 23  CREATININE 1.80* 1.76*  CALCIUM 9.2 9.1   Liver Function Tests: Recent Labs  Lab 04/16/18 0034 04/16/18 0539  AST 29 24  ALT  18 15  ALKPHOS 32* 30*  BILITOT 0.6 0.4  PROT 6.9 6.2*  ALBUMIN 4.0 3.6   No results for input(s): LIPASE, AMYLASE in the last 168 hours. No results for input(s): AMMONIA in the last 168 hours. CBC: Recent Labs  Lab 04/16/18 0034  WBC 5.5  NEUTROABS 4.9  HGB 13.9  HCT 43.5  MCV 91.8  PLT 154   Cardiac Enzymes: Recent Labs  Lab 04/16/18 0034 04/16/18 0539  CKTOTAL 375 349  TROPONINI <0.03 <0.03    BNP (last 3 results) No results for input(s): BNP in the last 8760 hours.  ProBNP (last 3 results) No results for input(s): PROBNP in the last 8760 hours.  CBG: Recent Labs  Lab 04/16/18 0049  GLUCAP 177*    Radiological Exams on Admission: Ct Head Wo Contrast  Result Date: 04/16/2018 CLINICAL DATA:  Syncopal episode, possible seizure EXAM: CT HEAD WITHOUT CONTRAST CT CERVICAL SPINE WITHOUT CONTRAST TECHNIQUE: Multidetector CT imaging of the head and cervical spine was performed following the standard protocol without intravenous contrast. Multiplanar CT image reconstructions of the cervical spine were also generated. COMPARISON:  CT 05/22/2011 FINDINGS: CT HEAD FINDINGS Brain: No acute territorial infarction, hemorrhage or intracranial mass. Mild small vessel ischemic changes of the white matter. Mild atrophy. Stable ventricle size. Vascular: No hyperdense vessels.  Carotid vascular calcification Skull: Normal. Negative for fracture or focal lesion. Sinuses/Orbits: No acute finding. Other: None CT CERVICAL SPINE FINDINGS Alignment: Trace retrolisthesis C4 on C5. Facet alignment within normal limits. Skull base and vertebrae: No acute fracture. No primary bone lesion or focal pathologic process. Soft tissues and spinal canal: No prevertebral fluid or swelling. No visible canal hematoma. Disc levels: Moderate to marked degenerative change C4-C5 with mild degenerative change at C5-C6. Bilateral foraminal narrowing at C4-C5. Upper chest: Negative. Other: Partially calcified nodule in  the left lung apex, no change IMPRESSION: 1. No CT evidence for acute intracranial abnormality. Mild atrophy and small vessel ischemic changes of the white matter 2. Degenerative changes of the cervical spine. No acute osseous abnormality Electronically Signed   By: Donavan Foil M.D.   On: 04/16/2018 01:36  Ct Cervical Spine Wo Contrast  Result Date: 04/16/2018 CLINICAL DATA:  Syncopal episode, possible seizure EXAM: CT HEAD WITHOUT CONTRAST CT CERVICAL SPINE WITHOUT CONTRAST TECHNIQUE: Multidetector CT imaging of the head and cervical spine was performed following the standard protocol without intravenous contrast. Multiplanar CT image reconstructions of the cervical spine were also generated. COMPARISON:  CT 05/22/2011 FINDINGS: CT HEAD FINDINGS Brain: No acute territorial infarction, hemorrhage or intracranial mass. Mild small vessel ischemic changes of the white matter. Mild atrophy. Stable ventricle size. Vascular: No hyperdense vessels.  Carotid vascular calcification Skull: Normal. Negative for fracture or focal lesion. Sinuses/Orbits: No acute finding. Other: None CT CERVICAL SPINE FINDINGS Alignment: Trace retrolisthesis C4 on C5. Facet alignment within normal limits. Skull base and vertebrae: No acute fracture. No primary bone lesion or focal pathologic process. Soft tissues and spinal canal: No prevertebral fluid or swelling. No visible canal hematoma. Disc levels: Moderate to marked degenerative change C4-C5 with mild degenerative change at C5-C6. Bilateral foraminal narrowing at C4-C5. Upper chest: Negative. Other: Partially calcified nodule in the left lung apex, no change IMPRESSION: 1. No CT evidence for acute intracranial abnormality. Mild atrophy and small vessel ischemic changes of the white matter 2. Degenerative changes of the cervical spine. No acute osseous abnormality Electronically Signed   By: Donavan Foil M.D.   On: 04/16/2018 01:36    EKG: Independently reviewed.  Sinus rhythm,  no acute ST-T wave changes  Assessment/Plan Principal Problem:    Syncope and collapse -Due to long sinus pause, likely has underlying conduction abnormalities worsened by low-dose bisoprolol -2 episodes of syncope prior to admission and another in the emergency room -Discontinue beta-blocker, continue external pacer pads -Admit to stepdown unit -Cardiology consult -Check TSH, 2D echocardiogram in 06/2016 noted preserved EF, no significant valvular disease,  will repeat echo    CKD (chronic kidney disease), stage III (HCC) -Stable, creatinine at baseline  History of CAD -History of non-STEMI and remote cath in 2011 noted occluded circumflex and collaterals on medical management -Discontinue beta-blocker as noted above, continue aspirin and statin  History of depression/anxiety -Continue Celexa  History of chronic pain -Continue home regimen of Ultracet and gabapentin -Decreased dose for GFR  History of BPH -Continue Flomax, Proscar  Code Status: DNR DVT Prophylaxis:lovenox Family Communication: No family at bedside Disposition Plan: Home pending above workup and Rx  Time spent: 50min  Jelina Paulsen [facility]

## 2018-04-16 NOTE — ED Notes (Signed)
Pt back from rest room in bed when he had a syncopal episode , pt had an approx 6 to 8 sec pause on the monitor , pt awake right after episode , pt placed on zoll pads , and Md made aware ,pt is SR at this time

## 2018-04-16 NOTE — ED Notes (Signed)
Report called  

## 2018-04-16 NOTE — ED Notes (Signed)
Pt able to ambulate to and from hallway bathroom with minimal assistance. Gait slow and somewhat weak.

## 2018-04-16 NOTE — Consult Note (Addendum)
Cardiology Consultation:   Patient ID: Gary [name] MRN: [id number]; DOB: 08-21-1932  Admit date: 04/15/2018 Date of Consult: 04/16/2018  Primary Care Provider: Pixie Casino, MD Primary Cardiologist: Dr. Debara Pickett Primary Electrophysiologist:  None    Patient Profile:   Gary [name] is a 85 y.o. male with a hx of CAD (2011 he had a nonSTEMI with an occluded circumflex artery and collaterals, has been treated medically), sounds like a remote very brief (82minutes) of AFib in the environment of sepsis, CKD (III), HTN, HLD, who is being seen today for the evaluation of Syncope, and bradycardia at the request of Dr. Broadus John.  History of Present Illness:   Mr. Barren reports being inhis USOH, no cardiac awarenss of any CP, palpitations, when today while on the toilet fainted, no warning, just found himself on the floor.  He was able to get himself up and they did call EMS.  He was feeling well, without any clear abnormal findings he declined transport. Sometime later apparently family found his unconscious, arms seemed to be shaking (estimated to have lasted 3-26minutes) and EMS called.    EMS report reviewed, he was found AAO not confused, 182/90, SR 70's, transported without recurrent LOC.  While here checking orthostatic vitals abd a trip to the bathroom without issue, upon returning to bed bed and being placed back on telemetry he was observed to have LOC (able to be helped to supine with fall or injury), associated with a long pause on telemetry.  Once HR returned, the patient immediately woke.     He has been admitted with medicine service   LABS K+ 4.8 BUN/Creat 22/1.80 > 23/1.76 (looks about hs baseline) Trop I <0.03 x2 BNP, 375, 349 Lactic acid 2.2, 2.6, 2.1  WBC 5.5 H/H 13/43 Plts 154  Home meds reviewed: bisoprolol 25mg  daily, no other potential rate limiting/nodal blocking agentsnoted   Past Medical History:  Diagnosis Date  . Adenomatous colon polyp    2010   .  Anemia   . Anxiety   . Arthritis    "hips and lower back"  . Arthritis pain   . Blood transfusion   . Chest pain 03/29/11   2D Echo without contrast on 03/29/11 - EF= 55-60%.  . Cholecystitis   . Chronic back pain greater than 3 months duration   . Chronic kidney disease   . Colon polyps   . Complication of anesthesia    once slow to wake up  . Coronary angioplasty status 2011  . Coronary artery disease   . Diarrhea   . Diverticulosis   . Dyslipidemia   . GERD (gastroesophageal reflux disease)   . GI bleeding after 07/2009   "triggered by Plavix & ASA; from my diverticulosis"  . H/O Clostridium difficile infection   . H/O myocardial perfusion scan 04/04/11   For Pre-noncardiac surgery - EF = 67%, no ischemia, and is considered low risk.  Marland Kitchen Heart attack ([place]) 07/2009   nonSTEMI with an occluded circumflex artery and collaterals.  Marland Kitchen Heart murmur   . Hemorrhoids   . History of shingles   . Hyperlipidemia   . Hypertension   . Kidney stones   . Lower extremity edema    Taking furosemide and it seems to be helping.  . Neuromuscular disorder ([place])   . Nonspecific chest pain 08/26/2012   Went to ED 08/26/12  . Stomach problems     Past Surgical History:  Procedure Laterality Date  . BACK SURGERY  1997  .  CARDIAC CATHETERIZATION  08/09/2009   Circumflex 100% occluded with right-to-left collaterals, mild irregularities in the proximal and distal LAD and diagonal system, normal LV systolic function, and minor infrarenal irregularities, but no abdominal aortic aneurysm.  . CHOLECYSTECTOMY  04/12/2011   Procedure: CHOLECYSTECTOMY;  Surgeon: Adin Hector, MD;  Location: [facility] [place] [facility];  Service: General;  Laterality: N/A;  . CHOLECYSTECTOMY  04/12/2011   Procedure: LAPAROSCOPIC CHOLECYSTECTOMY;  Surgeon: Adin Hector, MD;  Location: [place];  Service: General;  Laterality: N/A;  converted to open  . COLON SURGERY     COLONOSCOPY  . ESOPHAGOGASTRODUODENOSCOPY N/A 08/05/2013   Procedure:  ESOPHAGOGASTRODUODENOSCOPY (EGD);  Surgeon: Jerene Bears, MD;  Location: [facility];  Service: Gastroenterology;  Laterality: N/A;  . EYE SURGERY  1942   "right eye was crossed; they  corrected it"  . LUMBAR FUSION  07/22/2012  . POSTERIOR FUSION LUMBAR SPINE  08/1995   "bottom 4 vertebra"  . SUBTOTAL COLECTOMY  01/24/2010  . TOTAL HIP ARTHROPLASTY Right 08/03/2013   Procedure: TOTAL HIP ARTHROPLASTY ANTERIOR APPROACH;  Surgeon: Hessie Dibble, MD;  Location: [place];  Service: Orthopedics;  Laterality: Right;     Home Medications:  Prior to Admission medications   Medication Sig Start Date End Date Taking? Authorizing Provider  ANDROGEL PUMP 20.25 MG/ACT (1.62%) GEL Apply 1 application topically as needed (for low level).  01/15/14  Yes [provider]  aspirin EC 81 MG tablet Take 81 mg by mouth daily.   Yes [provider]  bisoprolol (ZEBETA) 5 MG tablet TAKE 1/2 TABLETS (2.5 MG TOTAL) BY MOUTH DAILY. Patient taking differently: Take 2.5 mg by mouth daily.  01/23/15  Yes Hilty, Nadean Corwin, MD  citalopram (CELEXA) 40 MG tablet TAKE 1 TABLET EVERY DAY Patient taking differently: Take 40 mg by mouth daily.  11/09/13  Yes Hilty, Nadean Corwin, MD  doxazosin (CARDURA) 8 MG tablet Take 4 mg by mouth at bedtime.    Yes [provider]  fluticasone (FLONASE) 50 MCG/ACT nasal spray Place 2 sprays into the nose daily.    Yes [provider]  furosemide (LASIX) 20 MG tablet Take 1 tablet (20 mg total) by mouth daily as needed. Patient taking differently: Take 20 mg by mouth every Monday, Wednesday, and Friday.  05/14/16  Yes Hilty, Nadean Corwin, MD  gabapentin (NEURONTIN) 400 MG capsule Take 400 mg by mouth 3 (three) times daily.    Yes [provider]  nitroGLYCERIN (NITROSTAT) 0.4 MG SL tablet PLACE 1 TABLET UNDER THE TONGUE EVERY 5 MINUTES AS NEEDED FOR CHEST PAIN FOR 3 DOSES Patient taking differently: Place 0.4 mg under the tongue every 5 (five) minutes as  needed for chest pain.  10/25/16  Yes Hilty, Nadean Corwin, MD  simvastatin (ZOCOR) 40 MG tablet TAKE 1 TABLET BY MOUTH AT BEDTIME Patient taking differently: Take 40 mg by mouth daily at 6 PM.  03/18/16  Yes Hilty, Nadean Corwin, MD  Tamsulosin HCl (FLOMAX) 0.4 MG CAPS Take 0.4 mg by mouth daily after supper. 02/04/11  Yes Charlynne Cousins, MD  traMADol-acetaminophen (ULTRACET) 37.5-325 MG per tablet Take 2 tablets by mouth 3 (three) times daily.  09/23/12  Yes [provider]  AMBULATORY NON FORMULARY MEDICATION Place 2 mLs rectally 3 (three) times daily. Medication Name: 5 % lidocaine ointment mixed with 0.125 nitroglycerin ointment Patient not taking: Reported on 04/16/2018 06/24/16   Alfredia Ferguson, PA-C    Inpatient Medications: Scheduled Meds:  Continuous Infusions:  PRN Meds:   Allergies:    Allergies  Allergen Reactions  . Naproxen Rash  . Clopidogrel Bisulfate Other (See Comments)    H/O diverticulitis; prone to rectal bleeding.  Plavix complicated this.  jkl  . Sulfonamide Derivatives Hives  . Other Other (See Comments)    Narcotics-C. Diff  . Plavix [Clopidogrel Bisulfate]   . Latex Rash  . Tape Itching and Rash    Latex-Adhesive tape    Social History:   Social History   Socioeconomic History  . Marital status: Married    Spouse name: Not on file  . Number of children: 2  . Years of education: Not on file  . Highest education level: Not on file  Occupational History  . Occupation: Art therapist  Social Needs  . Financial resource strain: Not on file  . Food insecurity:    Worry: Not on file    Inability: Not on file  . Transportation needs:    Medical: Not on file    Non-medical: Not on file  Tobacco Use  . Smoking status: Never Smoker  . Smokeless tobacco: Never Used  Substance and Sexual Activity  . Alcohol use: No  . Drug use: No  . Sexual activity: Not Currently  Lifestyle  . Physical activity:    Days per week: Not on file    Minutes per  session: Not on file  . Stress: Not on file  Relationships  . Social connections:    Talks on phone: Not on file    Gets together: Not on file    Attends religious service: Not on file    Active member of club or organization: Not on file    Attends meetings of clubs or organizations: Not on file    Relationship status: Not on file  . Intimate partner violence:    Fear of current or ex partner: Not on file    Emotionally abused: Not on file    Physically abused: Not on file    Forced sexual activity: Not on file  Other Topics Concern  . Not on file  Social History Narrative   Daily caffeine use.    Family History:   Family History  Problem Relation Age of Onset  . Heart disease Father   . Stroke Father   . Pancreatic cancer Mother   . Diverticulosis Sister        x4  . Colon cancer Neg Hx      ROS:  Please see the history of present illness.  All other ROS reviewed and negative.     Physical Exam/Data:   Vitals:   04/16/18 0930 04/16/18 1000 04/16/18 1030 04/16/18 1100  BP: (!) 141/63 137/73 (!) 155/70 (!) 147/66  Pulse: 73     Resp: 15 17 16 18   Temp:      TempSrc:      SpO2: 95%     Weight:      Height:        Intake/Output Summary (Last 24 hours) at 04/16/2018 1143 Last data filed at 04/16/2018 0430 Gross per 24 hour  Intake 3 ml  Output 150 ml  Net -147 ml   Last 3 Weights 04/16/2018 01/16/2018 10/21/2017  Weight (lbs) 210 lb 213 lb 210 lb  Weight (kg) 95.255 kg 96.616 kg 95.255 kg     Body mass index is 32.89 kg/m.  General:  Well nourished, well developed, in no acute distress HEENT: normal Lymph: no adenopathy Neck: no JVD Endocrine:  No  thryomegaly Vascular: No carotid bruits  Cardiac: RRR; no murmurs, gallops or rubs Lungs:  CTA b/l, no wheezing, rhonchi or rales  Abd: soft, nontender, Ext: no edema Musculoskeletal:  No deformities, age appropriate atrophy Skin: warm and dry  Neuro:  no focal abnormalities noted Psych:  Normal affect, seems  anxious  EKG:  The EKG was personally reviewed and demonstrates:   SR, icRBBB, 74bpm (appears unchanged) Telemetry:  Telemetry was personally reviewed and demonstrates:   SR 70's=80's, he had one 10second CHB/V pause, and a couple shorter 2-3 second episodes.  Relevant CV Studies:  06/13/16: TTE Study Conclusions - Left ventricle: The cavity size was normal. Wall thickness was   increased in a pattern of mild LVH. Systolic function was normal.   The estimated ejection fraction was in the range of 55% to 60%.   Wall motion was normal; there were no regional wall motion   abnormalities. Doppler parameters are consistent with abnormal   left ventricular relaxation (grade 1 diastolic dysfunction). - Aortic valve: There was no stenosis. - Mitral valve: Moderately calcified annulus. There was no   significant regurgitation. - Left atrium: The atrium was mildly dilated. - Right ventricle: The cavity size was normal. Systolic function   was normal. - Tricuspid valve: Peak RV-RA gradient (S): 37 mm Hg. - Systemic veins: IVC was not visualized. Impressions: - Normal LV size with mild LV hypertrophy. EF 55-60%. Normal RV   size and systolic function. No significant valvular   abnormalities. Mild pulmonary hypertension.  01/13/15: stress myoview  The left ventricular ejection fraction is normal (55-65%).  Nuclear stress EF: 55%.  There was no ST segment deviation noted during stress.  This is a low risk study.  Findings consistent with ischemia and prior myocardial infarction.   Low risk stress nuclear study with a small, severe, partially reversible lateral defect consistent with small prior infarct and very mild peri-infarct ischemia; EF 55 with hypokinesis of the high lateral wall.    Laboratory Data:  Chemistry Recent Labs  Lab 04/16/18 0034 04/16/18 0539  NA 137 136  K 4.9 4.8  CL 102 103  CO2 21* 22  GLUCOSE 205* 180*  BUN 22 23  CREATININE 1.80* 1.76*  CALCIUM 9.2  9.1  GFRNONAA 34* 35*  GFRAA 39* 41*  ANIONGAP 14 11    Recent Labs  Lab 04/16/18 0034 04/16/18 0539  PROT 6.9 6.2*  ALBUMIN 4.0 3.6  AST 29 24  ALT 18 15  ALKPHOS 32* 30*  BILITOT 0.6 0.4   Hematology Recent Labs  Lab 04/16/18 0034  WBC 5.5  RBC 4.74  HGB 13.9  HCT 43.5  MCV 91.8  MCH 29.3  MCHC 32.0  RDW 12.4  PLT 154   Cardiac Enzymes Recent Labs  Lab 04/16/18 0034 04/16/18 0539  TROPONINI <0.03 <0.03   No results for input(s): TROPIPOC in the last 168 hours.  BNPNo results for input(s): BNP, PROBNP in the last 168 hours.  DDimer No results for input(s): DDIMER in the last 168 hours.  Radiology/Studies:   Ct Head Wo Contrast Result Date: 04/16/2018 CLINICAL DATA:  Syncopal episode, possible seizure EXAM: CT HEAD WITHOUT CONTRAST CT CERVICAL SPINE WITHOUT CONTRAST TECHNIQUE: Multidetector CT imaging of the head and cervical spine was performed following the standard protocol without intravenous contrast. Multiplanar CT image reconstructions of the cervical spine were also generated. COMPARISON:  CT 05/22/2011 FINDINGS: CT HEAD FINDINGS Brain: No acute territorial infarction, hemorrhage or intracranial mass. Mild small vessel ischemic  changes of the white matter. Mild atrophy. Stable ventricle size. Vascular: No hyperdense vessels.  Carotid vascular calcification Skull: Normal. Negative for fracture or focal lesion. Sinuses/Orbits: No acute finding. Other: None CT CERVICAL SPINE FINDINGS Alignment: Trace retrolisthesis C4 on C5. Facet alignment within normal limits. Skull base and vertebrae: No acute fracture. No primary bone lesion or focal pathologic process. Soft tissues and spinal canal: No prevertebral fluid or swelling. No visible canal hematoma. Disc levels: Moderate to marked degenerative change C4-C5 with mild degenerative change at C5-C6. Bilateral foraminal narrowing at C4-C5. Upper chest: Negative. Other: Partially calcified nodule in the left lung apex, no  change IMPRESSION: 1. No CT evidence for acute intracranial abnormality. Mild atrophy and small vessel ischemic changes of the white matter 2. Degenerative changes of the cervical spine. No acute osseous abnormality Electronically Signed   By: Donavan Foil M.D.   On: 04/16/2018 01:36      Assessment and Plan:   1. Syncope 2. V standstill, CHB/pause 10 seconds (x1) w/LOC     A couple others much shorter 2-3 seconds without symptoms     Home bisoprolol, very low dose, though needs to wash out  1/2 life 9-12 hours (shorteds washout is 45hours) He has zoll pads on, admitted to [facility]  He Arnell Slivinski likely need pacing Hold home bisoprolol, no av nodal blocking agents NPO after MN tonight  Keerat Denicola re-evaluate telemetry in the morning  3. CAD     No anginal symptoms of late     Neg Trop     Dr. Curt Bears discussed with Dr. Debara Pickett, would bee OK with patient off his BB             For questions or updates, please contact [facility] Please consult [email or web address] for contact info under     Signed, Baldwin Jamaica, PA-C  04/16/2018 11:43 AM  I have seen and examined this patient with Tommye Standard.  Agree with above, note added to reflect my findings.  On exam, RRR, no murmurs, lungs clear.  Patient admitted to the hospital with episodes of syncope.  While being monitored in the emergency room, he was found to have a 10-second pause with syncope.  He is on bisoprolol as an outpatient.  I discussed with his primary cardiologist that he does not need to bisoprolol long-term.  Due to that, we Juanjesus Pepperman plan to allow bisoprolol to washout.  Bisoprolol has a 9 to 12-hour half-life.  If he continues to have pauses throughout the day tomorrow, would likely benefit from pacemaker implantation.  If no further pauses occur, would likely be discharged with a monitor.  Rolla Kedzierski M. Karryn Kosinski MD 04/16/2018 2:43 PM

## 2018-04-16 NOTE — ED Notes (Signed)
Pt made multiple attempts to urinate, but feels unable to void at this time. Encouraged pt to rest for now and try again a little later.

## 2018-04-16 NOTE — Progress Notes (Signed)
  Echocardiogram 2D Echocardiogram has been performed.  Gary [name] 04/16/2018, 6:13 PM

## 2018-04-16 NOTE — ED Provider Notes (Signed)
[facility] EMERGENCY DEPARTMENT Provider Note   CSN: [id number] Arrival date & time: 04/15/18  2354    History   Chief Complaint Chief Complaint  Patient presents with  . Loss of Consciousness    HPI Gary [name] is a 85 y.o. male. He has history of hypertension, hyperlipidemia, coronary artery disease, paroxysmal atrial fibrillation, chronic kidney disease and comes in following syncopal episodes at home.  He got lightheaded and passed out following having a bowel movement.  He had brief loss of consciousness.  He states he did hit his head when he fell.  There was no chest pain and no palpitations.  EMS was called to the home and noted normal blood pressure and he was back to normal so refused transport at that time.  Later, he was observed to have shaking of his arms with recurrent loss of consciousness.  Shaking was estimated to have lasted about 3-4 minutes.  There was no bit lip or tongue and no incontinence.  He regained normal mentation immediately following cessation of shaking.  Of note, he does take tramadol, but actually had not taken his evening dose.  He had not had episodes of syncope before tonight.  He has described episodes where his hands and feet get tingly, but without associated loss of consciousness.  Past Medical History:  Diagnosis Date  . Adenomatous colon polyp    2010   . Anemia   . Anxiety   . Arthritis    "hips and lower back"  . Arthritis pain   . Blood transfusion   . Chest pain 03/29/11   2D Echo without contrast on 03/29/11 - EF= 55-60%.  . Cholecystitis   . Chronic back pain greater than 3 months duration   . Chronic kidney disease   . Colon polyps   . Complication of anesthesia    once slow to wake up  . Coronary angioplasty status 2011  . Coronary artery disease   . Diarrhea   . Diverticulosis   . Dyslipidemia   . GERD (gastroesophageal reflux disease)   . GI bleeding after 07/2009   "triggered by Plavix & ASA; from my  diverticulosis"  . H/O Clostridium difficile infection   . H/O myocardial perfusion scan 04/04/11   For Pre-noncardiac surgery - EF = 67%, no ischemia, and is considered low risk.  Marland Kitchen Heart attack ([place]) 07/2009   nonSTEMI with an occluded circumflex artery and collaterals.  Marland Kitchen Heart murmur   . Hemorrhoids   . History of shingles   . Hyperlipidemia   . Hypertension   . Kidney stones   . Lower extremity edema    Taking furosemide and it seems to be helping.  . Neuromuscular disorder ([place])   . Nonspecific chest pain 08/26/2012   Went to ED 08/26/12  . Stomach problems     Patient Active Problem List   Diagnosis Date Noted  . Snoring 10/03/2017  . Other fatigue 10/03/2017  . Pre-syncope 05/14/2016  . Murmur 05/14/2016  . CAD in native artery 10/31/2015  . History of MI (myocardial infarction) 10/31/2015  . Preoperative cardiovascular examination 12/26/2014  . History of colonic polyps 01/17/2014  . PAF (paroxysmal atrial fibrillation) ([place]) 08/14/2013  . Metabolic acidosis 83/41/9622  . Acute renal failure ([place]) 08/13/2013  . Chronic kidney disease, stage 3 ([place]) 08/13/2013  . Anemia in chronic kidney disease 08/13/2013  . Hypokalemia 08/12/2013  . Acute blood loss anemia 08/09/2013  . Encephalopathy 08/09/2013  . Severe sepsis (  [place]) 08/08/2013  . Hypotension, unspecified 08/07/2013  . Complex renal cyst 08/07/2013  . Renal mass, left 08/06/2013  . Upper GI bleeding 08/05/2013  . Acute esophagitis 08/05/2013  . Urinary retention 08/04/2013  . S/P total hip arthroplasty 08/03/2013  . Degenerative joint disease (DJD) of hip 08/03/2013    Class: Chronic  . Internal hemorrhoids with other complication 85/27/7824  . Tinnitus 10/22/2012  . C. difficile colitis 07/26/2012  . Ileus, postoperative ([place]) 07/24/2012  . Somnolence 07/24/2012  . Anxiety state 07/24/2012  . Chest pain- ER 08/26/12 03/30/2011  . Fever 03/28/2011  . Leucocytosis 03/28/2011  . CKD (chronic kidney  disease), stage III ([place]) 03/28/2011  . Hyponatremia 02/02/2011  . BPH (benign prostatic hyperplasia) 02/01/2011  . Cholecystitis chronic, acute 01/26/2011  . Dyslipidemia 01/22/2011  . Abdominal bloating 08/07/2010  . Loose stools 08/07/2010  . Coronary atherosclerosis 10/26/2009  . Essential hypertension 06/07/2009  . DIVERTICULOSIS OF COLON 10/31/2008  . DIVERTICULOSIS, COLON, WITH HEMORRHAGE, surg 12/11  10/31/2008  . Personal history of colonic polyps 10/31/2008    Past Surgical History:  Procedure Laterality Date  . BACK SURGERY  1997  . CARDIAC CATHETERIZATION  08/09/2009   Circumflex 100% occluded with right-to-left collaterals, mild irregularities in the proximal and distal LAD and diagonal system, normal LV systolic function, and minor infrarenal irregularities, but no abdominal aortic aneurysm.  . CHOLECYSTECTOMY  04/12/2011   Procedure: CHOLECYSTECTOMY;  Surgeon: Adin Hector, MD;  Location: [facility];  Service: General;  Laterality: N/A;  . CHOLECYSTECTOMY  04/12/2011   Procedure: LAPAROSCOPIC CHOLECYSTECTOMY;  Surgeon: Adin Hector, MD;  Location: [facility];  Service: General;  Laterality: N/A;  converted to open  . COLON SURGERY     COLONOSCOPY  . ESOPHAGOGASTRODUODENOSCOPY N/A 08/05/2013   Procedure: ESOPHAGOGASTRODUODENOSCOPY (EGD);  Surgeon: Jerene Bears, MD;  Location: [facility];  Service: Gastroenterology;  Laterality: N/A;  . EYE SURGERY  1942   "right eye was crossed; they  corrected it"  . LUMBAR FUSION  07/22/2012  . POSTERIOR FUSION LUMBAR SPINE  08/1995   "bottom 4 vertebra"  . SUBTOTAL COLECTOMY  01/24/2010  . TOTAL HIP ARTHROPLASTY Right 08/03/2013   Procedure: TOTAL HIP ARTHROPLASTY ANTERIOR APPROACH;  Surgeon: Hessie Dibble, MD;  Location: [facility];  Service: Orthopedics;  Laterality: Right;        Home Medications    Prior to Admission medications   Medication Sig Start Date End Date Taking? Authorizing Provider  AMBULATORY NON FORMULARY MEDICATION  Place 2 mLs rectally 3 (three) times daily. Medication Name: 5 % lidocaine ointment mixed with 0.125 nitroglycerin ointment 06/24/16   Esterwood, Amy S, PA-C  ANDROGEL PUMP 20.25 MG/ACT (1.62%) GEL Apply 1 application topically as needed (for low level).  01/15/14   [provider]  aspirin EC 81 MG tablet Take 81 mg by mouth daily.    [provider]  aspirin EC 81 MG tablet Take 81 mg by mouth daily.    [provider]  bisoprolol (ZEBETA) 5 MG tablet TAKE 1/2 TABLETS (2.5 MG TOTAL) BY MOUTH DAILY. Patient taking differently: Take 2.5 mg by mouth daily.  01/23/15   Hilty, Nadean Corwin, MD  citalopram (CELEXA) 40 MG tablet TAKE 1 TABLET EVERY DAY Patient taking differently: Take 40 mg by mouth daily.  11/09/13   Hilty, Nadean Corwin, MD  doxazosin (CARDURA) 8 MG tablet Take 4 mg by mouth at bedtime.     [provider]  fluticasone (FLONASE) 50 MCG/ACT nasal  spray Place 2 sprays into the nose daily.     [provider]  furosemide (LASIX) 20 MG tablet Take 1 tablet (20 mg total) by mouth daily as needed. Patient taking differently: Take 20 mg by mouth every Monday, Wednesday, and Friday.  05/14/16   Hilty, Nadean Corwin, MD  gabapentin (NEURONTIN) 400 MG capsule Take 400 mg by mouth 3 (three) times daily.     [provider]  LORazepam (ATIVAN) 0.5 MG tablet Take 0.25 mg by mouth as needed.     [provider]  nitroGLYCERIN (NITROSTAT) 0.4 MG SL tablet PLACE 1 TABLET UNDER THE TONGUE EVERY 5 MINUTES AS NEEDED FOR CHEST PAIN FOR 3 DOSES Patient taking differently: Place 0.4 mg under the tongue every 5 (five) minutes as needed for chest pain.  10/25/16   Hilty, Nadean Corwin, MD  simvastatin (ZOCOR) 40 MG tablet TAKE 1 TABLET BY MOUTH AT BEDTIME Patient taking differently: Take 40 mg by mouth daily at 6 PM.  03/18/16   Hilty, Nadean Corwin, MD  Tamsulosin HCl (FLOMAX) 0.4 MG CAPS Take 0.4 mg by mouth daily after supper. 02/04/11   Charlynne Cousins, MD    traMADol-acetaminophen (ULTRACET) 37.5-325 MG per tablet Take 2 tablets by mouth 3 (three) times daily.  09/23/12   [provider]    Family History Family History  Problem Relation Age of Onset  . Heart disease Father   . Stroke Father   . Pancreatic cancer Mother   . Diverticulosis Sister        x4  . Colon cancer Neg Hx     Social History Social History   Tobacco Use  . Smoking status: Never Smoker  . Smokeless tobacco: Never Used  Substance Use Topics  . Alcohol use: No  . Drug use: No     Allergies   Naproxen; Clopidogrel bisulfate; Sulfonamide derivatives; Other; Plavix [clopidogrel bisulfate]; Latex; and Tape   Review of Systems Review of Systems  Cardiovascular: Positive for syncope.  All other systems reviewed and are negative.    Physical Exam Updated Vital Signs BP (!) 168/66 (BP Location: Right Arm)   Pulse 75   Temp 98.6 F (37 C) (Oral)   Resp 19   Ht 5\' 7"  (1.702 m)   Wt 95.3 kg   SpO2 93%   BMI 32.89 kg/m   Physical Exam Vitals signs and nursing note reviewed.    85 year old male, resting comfortably and in no acute distress. Vital signs are significant for elevated systolic blood pressure. Oxygen saturation is 93%, which is normal. Head is normocephalic and atraumatic. PERRLA, EOMI. Oropharynx is clear. Neck is nontender and supple without adenopathy or JVD. Back is nontender and there is no CVA tenderness. Lungs are clear without rales, wheezes, or rhonchi. Chest is nontender. Heart has regular rate and rhythm without murmur. Abdomen is soft, flat, nontender without masses or hepatosplenomegaly and peristalsis is normoactive. Extremities have no cyanosis or edema, full range of motion is present. Skin is warm and dry without rash. Neurologic: Mental status is normal, cranial nerves are intact, there are no motor or sensory deficits.  ED Treatments / Results  Labs (all labs ordered are listed, but only abnormal results  are displayed) Labs Reviewed  URINALYSIS, ROUTINE W REFLEX MICROSCOPIC - Abnormal; Notable for the following components:      Result Value   Glucose, UA 50 (*)    Protein, ur 30 (*)    All other components within normal  limits  LACTIC ACID, PLASMA - Abnormal; Notable for the following components:   Lactic Acid, Venous 2.2 (*)    All other components within normal limits  LACTIC ACID, PLASMA - Abnormal; Notable for the following components:   Lactic Acid, Venous 2.6 (*)    All other components within normal limits  CBC WITH DIFFERENTIAL/PLATELET - Abnormal; Notable for the following components:   Lymphs Abs 0.5 (*)    All other components within normal limits  COMPREHENSIVE METABOLIC PANEL - Abnormal; Notable for the following components:   CO2 21 (*)    Glucose, Bld 205 (*)    Creatinine, Ser 1.80 (*)    Alkaline Phosphatase 32 (*)    GFR calc non Af Amer 34 (*)    GFR calc Af Amer 39 (*)    All other components within normal limits  COMPREHENSIVE METABOLIC PANEL - Abnormal; Notable for the following components:   Glucose, Bld 180 (*)    Creatinine, Ser 1.76 (*)    Total Protein 6.2 (*)    Alkaline Phosphatase 30 (*)    GFR calc non Af Amer 35 (*)    GFR calc Af Amer 41 (*)    All other components within normal limits  LACTIC ACID, PLASMA - Abnormal; Notable for the following components:   Lactic Acid, Venous 2.1 (*)    All other components within normal limits  CBG MONITORING, ED - Abnormal; Notable for the following components:   Glucose-Capillary 177 (*)    All other components within normal limits  CK  TROPONIN I  CK  TROPONIN I    EKG EKG Interpretation  Date/Time:  Thursday April 16 2018 00:01:28 EST Ventricular Rate:  74 PR Interval:    QRS Duration: 106 QT Interval:  402 QTC Calculation: 446 R Axis:   32 Text Interpretation:  Sinus rhythm Incomplete right bundle branch block When compared with ECG of 01/09/2018, No significant change was found Confirmed  by Delora Fuel ([id number]) on 04/16/2018 12:26:56 AM  Radiology Ct Head Wo Contrast  Result Date: 04/16/2018 CLINICAL DATA:  Syncopal episode, possible seizure EXAM: CT HEAD WITHOUT CONTRAST CT CERVICAL SPINE WITHOUT CONTRAST TECHNIQUE: Multidetector CT imaging of the head and cervical spine was performed following the standard protocol without intravenous contrast. Multiplanar CT image reconstructions of the cervical spine were also generated. COMPARISON:  CT 05/22/2011 FINDINGS: CT HEAD FINDINGS Brain: No acute territorial infarction, hemorrhage or intracranial mass. Mild small vessel ischemic changes of the white matter. Mild atrophy. Stable ventricle size. Vascular: No hyperdense vessels.  Carotid vascular calcification Skull: Normal. Negative for fracture or focal lesion. Sinuses/Orbits: No acute finding. Other: None CT CERVICAL SPINE FINDINGS Alignment: Trace retrolisthesis C4 on C5. Facet alignment within normal limits. Skull base and vertebrae: No acute fracture. No primary bone lesion or focal pathologic process. Soft tissues and spinal canal: No prevertebral fluid or swelling. No visible canal hematoma. Disc levels: Moderate to marked degenerative change C4-C5 with mild degenerative change at C5-C6. Bilateral foraminal narrowing at C4-C5. Upper chest: Negative. Other: Partially calcified nodule in the left lung apex, no change IMPRESSION: 1. No CT evidence for acute intracranial abnormality. Mild atrophy and small vessel ischemic changes of the white matter 2. Degenerative changes of the cervical spine. No acute osseous abnormality Electronically Signed   By: Donavan Foil M.D.   On: 04/16/2018 01:36   Ct Cervical Spine Wo Contrast  Result Date: 04/16/2018 CLINICAL DATA:  Syncopal episode, possible seizure EXAM: CT HEAD WITHOUT CONTRAST CT  CERVICAL SPINE WITHOUT CONTRAST TECHNIQUE: Multidetector CT imaging of the head and cervical spine was performed following the standard protocol without intravenous  contrast. Multiplanar CT image reconstructions of the cervical spine were also generated. COMPARISON:  CT 05/22/2011 FINDINGS: CT HEAD FINDINGS Brain: No acute territorial infarction, hemorrhage or intracranial mass. Mild small vessel ischemic changes of the white matter. Mild atrophy. Stable ventricle size. Vascular: No hyperdense vessels.  Carotid vascular calcification Skull: Normal. Negative for fracture or focal lesion. Sinuses/Orbits: No acute finding. Other: None CT CERVICAL SPINE FINDINGS Alignment: Trace retrolisthesis C4 on C5. Facet alignment within normal limits. Skull base and vertebrae: No acute fracture. No primary bone lesion or focal pathologic process. Soft tissues and spinal canal: No prevertebral fluid or swelling. No visible canal hematoma. Disc levels: Moderate to marked degenerative change C4-C5 with mild degenerative change at C5-C6. Bilateral foraminal narrowing at C4-C5. Upper chest: Negative. Other: Partially calcified nodule in the left lung apex, no change IMPRESSION: 1. No CT evidence for acute intracranial abnormality. Mild atrophy and small vessel ischemic changes of the white matter 2. Degenerative changes of the cervical spine. No acute osseous abnormality Electronically Signed   By: Donavan Foil M.D.   On: 04/16/2018 01:36    Procedures Procedures  CRITICAL CARE Performed by: Delora Fuel Total critical care time: 35 minutes Critical care time was exclusive of separately billable procedures and treating other patients. Critical care was necessary to treat or prevent imminent or life-threatening deterioration. Critical care was time spent personally by me on the following activities: development of treatment plan with patient and/or surrogate as well as nursing, discussions with consultants, evaluation of patient's response to treatment, examination of patient, obtaining history from patient or surrogate, ordering and performing treatments and interventions, ordering and  review of laboratory studies, ordering and review of radiographic studies, pulse oximetry and re-evaluation of patient's condition.  Medications Ordered in ED Medications  sodium chloride flush (NS) 0.9 % injection 3 mL (3 mLs Intravenous Given 04/16/18 0028)  ondansetron (ZOFRAN) injection 4 mg (4 mg Intravenous Given 04/16/18 0814)     Initial Impression / Assessment and Plan / ED Course  I have reviewed the triage vital signs and the nursing notes.  Pertinent labs & imaging results that were available during my care of the patient were reviewed by me and considered in my medical decision making (see chart for details).  Syncope with observed seizure-like activity with second episode of loss of consciousness.  I suspect that this is a true seizure.  Screening labs are obtained and will send for CT of head and cervical spine.  ECG is unchanged from baseline.  Old records are reviewed, and he has no relevant past visits.  CT of head and cervical spine showed no acute process.  Labs showed stable renal insufficiency, mild elevation of lactic acid which would be consistent with seizure.  CK was normal, but actually was delayed by several hours from when he came in and might have missed an elevation from a seizure.  Orthostatic vital signs were requested, but patient had and another episode where he went unconscious and monitor showed a sinus pause which lasted about 8 seconds.  This is felt to be the cause of his syncope.  He was placed on an external pacemaker.  Review of his medication shows that he is taking a beta-blocker.  He will be admitted for cardiac monitoring while beta-blocker washes out of his system.  However, I strongly suspect underlying sick sinus syndrome  and suspect he will need a pacemaker.  Case is discussed with Dr. Broadus John of [facility] hospital[facility], who agrees to admit the patient.  Final Clinical Impressions(s) / ED Diagnoses   Final diagnoses:  Stokes-Adams syncope  Sinus pause    Elevated lactic acid level  Renal insufficiency    ED Discharge Orders    None       Delora Fuel, MD [birth year]83 [phone]

## 2018-04-16 NOTE — ED Notes (Signed)
ED TO INPATIENT HANDOFF REPORT  ED Nurse Name and Phone #: [place] Jayson Waterhouse [phone]  S Name/Age/Gender Gary [name] 85 y.o. male Room/Bed: 035C/035C  Code Status   Code Status: Prior  Home/SNF/Other Home Patient oriented to: self, place, time and situation Is this baseline? Yes   Triage Complete: Triage complete  Chief Complaint syncope   Triage Note BIB EMS from home. EMS called to home ~19min ago when pt had LOC after a BM. Pt refused transport at that time. ~55min later, pt noted by family to have episode of "shaking" and unresponsive for 18min. Per EMS, after shaking, pt opened eyes and was immediately responsive and oriented to baseline. No incontinence, tongue trauma, or other notable postictal period. No prior hx of seizures.    Allergies Allergies  Allergen Reactions  . Naproxen Rash  . Clopidogrel Bisulfate Other (See Comments)    H/O diverticulitis; prone to rectal bleeding.  Plavix complicated this.  jkl  . Sulfonamide Derivatives Hives  . Other Other (See Comments)    Narcotics-C. Diff  . Plavix [Clopidogrel Bisulfate]   . Latex Rash  . Tape Itching and Rash    Latex-Adhesive tape    Level of Care/Admitting Diagnosis ED Disposition    ED Disposition Condition [facility] Area: [place] [100100]  Level of Care: Progressive [102]  Diagnosis: Syncope [852778]  Admitting Physician: [place], [place]  Attending Physician: Domenic Polite [[id number]]  Estimated length of stay: past midnight tomorrow  Certification:: I certify this patient will need inpatient services for at least 2 midnights  PT Class (Do Not Modify): Inpatient [101]  PT Acc Code (Do Not Modify): Private [1]       B Medical/Surgery History Past Medical History:  Diagnosis Date  . Adenomatous colon polyp    2010   . Anemia   . Anxiety   . Arthritis    "hips and lower back"  . Arthritis pain   . Blood transfusion   . Chest pain 03/29/11   2D Echo  without contrast on 03/29/11 - EF= 55-60%.  . Cholecystitis   . Chronic back pain greater than 3 months duration   . Chronic kidney disease   . Colon polyps   . Complication of anesthesia    once slow to wake up  . Coronary angioplasty status 2011  . Coronary artery disease   . Diarrhea   . Diverticulosis   . Dyslipidemia   . GERD (gastroesophageal reflux disease)   . GI bleeding after 07/2009   "triggered by Plavix & ASA; from my diverticulosis"  . H/O Clostridium difficile infection   . H/O myocardial perfusion scan 04/04/11   For Pre-noncardiac surgery - EF = 67%, no ischemia, and is considered low risk.  Marland Kitchen Heart attack ([place]) 07/2009   nonSTEMI with an occluded circumflex artery and collaterals.  Marland Kitchen Heart murmur   . Hemorrhoids   . History of shingles   . Hyperlipidemia   . Hypertension   . Kidney stones   . Lower extremity edema    Taking furosemide and it seems to be helping.  . Neuromuscular disorder ([place])   . Nonspecific chest pain 08/26/2012   Went to ED 08/26/12  . Stomach problems    Past Surgical History:  Procedure Laterality Date  . BACK SURGERY  1997  . CARDIAC CATHETERIZATION  08/09/2009   Circumflex 100% occluded with right-to-left collaterals, mild irregularities in the proximal and distal LAD and diagonal system, normal  LV systolic function, and minor infrarenal irregularities, but no abdominal aortic aneurysm.  . CHOLECYSTECTOMY  04/12/2011   Procedure: CHOLECYSTECTOMY;  Surgeon: Adin Hector, MD;  Location: [facility];  Service: General;  Laterality: N/A;  . CHOLECYSTECTOMY  04/12/2011   Procedure: LAPAROSCOPIC CHOLECYSTECTOMY;  Surgeon: Adin Hector, MD;  Location: [facility];  Service: General;  Laterality: N/A;  converted to open  . COLON SURGERY     COLONOSCOPY  . ESOPHAGOGASTRODUODENOSCOPY N/A 08/05/2013   Procedure: ESOPHAGOGASTRODUODENOSCOPY (EGD);  Surgeon: Jerene Bears, MD;  Location: [facility];  Service: Gastroenterology;  Laterality: N/A;  . EYE  SURGERY  1942   "right eye was crossed; they  corrected it"  . LUMBAR FUSION  07/22/2012  . POSTERIOR FUSION LUMBAR SPINE  08/1995   "bottom 4 vertebra"  . SUBTOTAL COLECTOMY  01/24/2010  . TOTAL HIP ARTHROPLASTY Right 08/03/2013   Procedure: TOTAL HIP ARTHROPLASTY ANTERIOR APPROACH;  Surgeon: Hessie Dibble, MD;  Location: [facility];  Service: Orthopedics;  Laterality: Right;     A IV Location/Drains/Wounds Patient Lines/Drains/Airways Status   Active Line/Drains/Airways    Name:   Placement date:   Placement time:   Site:   Days:   Peripheral IV 04/15/18 Left Antecubital   04/15/18    2359    Antecubital   1   Peripheral IV 04/16/18 Left Forearm   04/16/18    0831    Forearm   less than 1          Intake/Output Last 24 hours  Intake/Output Summary (Last 24 hours) at 04/16/2018 1048 Last data filed at 04/16/2018 0430 Gross per 24 hour  Intake 3 ml  Output 150 ml  Net -147 ml    Labs/Imaging Results for orders placed or performed during the hospital encounter of 04/15/18 (from the past 48 hour(s))  CBC with Differential     Status: Abnormal   Collection Time: 04/16/18 12:34 AM  Result Value Ref Range   WBC 5.5 4.0 - 10.5 K/uL   RBC 4.74 4.22 - 5.81 MIL/uL   Hemoglobin 13.9 13.0 - 17.0 g/dL   HCT 43.5 39.0 - 52.0 %   MCV 91.8 80.0 - 100.0 fL   MCH 29.3 26.0 - 34.0 pg   MCHC 32.0 30.0 - 36.0 g/dL   RDW 12.4 11.5 - 15.5 %   Platelets 154 150 - 400 K/uL   nRBC 0.0 0.0 - 0.2 %   Neutrophils Relative % 89 %   Neutro Abs 4.9 1.7 - 7.7 K/uL   Lymphocytes Relative 9 %   Lymphs Abs 0.5 (L) 0.7 - 4.0 K/uL   Monocytes Relative 1 %   Monocytes Absolute 0.1 0.1 - 1.0 K/uL   Eosinophils Relative 0 %   Eosinophils Absolute 0.0 0.0 - 0.5 K/uL   Basophils Relative 0 %   Basophils Absolute 0.0 0.0 - 0.1 K/uL   Immature Granulocytes 1 %   Abs Immature Granulocytes 0.05 0.00 - 0.07 K/uL    Comment: Performed at [facility], [address], [place], [place] [address]  CK      Status: None   Collection Time: 04/16/18 12:34 AM  Result Value Ref Range   Total CK 375 49 - 397 U/L    Comment: Performed at [facility], [place] [address] [place], [place] [address]  Comprehensive metabolic panel     Status: Abnormal   Collection Time: 04/16/18 12:34 AM  Result Value Ref Range   Sodium  137 135 - 145 mmol/L   Potassium 4.9 3.5 - 5.1 mmol/L   Chloride 102 98 - 111 mmol/L   CO2 21 (L) 22 - 32 mmol/L   Glucose, Bld 205 (H) 70 - 99 mg/dL   BUN 22 8 - 23 mg/dL   Creatinine, Ser 1.80 (H) 0.61 - 1.24 mg/dL   Calcium 9.2 8.9 - 10.3 mg/dL   Total Protein 6.9 6.5 - 8.1 g/dL   Albumin 4.0 3.5 - 5.0 g/dL   AST 29 15 - 41 U/L   ALT 18 0 - 44 U/L   Alkaline Phosphatase 32 (L) 38 - 126 U/L   Total Bilirubin 0.6 0.3 - 1.2 mg/dL   GFR calc non Af Amer 34 (L) >60 mL/min   GFR calc Af Amer 39 (L) >60 mL/min   Anion gap 14 5 - 15    Comment: Performed at [facility] [address] [place], [place] [address]  Troponin I - Add-On to previous collection     Status: None   Collection Time: 04/16/18 12:34 AM  Result Value Ref Range   Troponin I <0.03 <0.03 ng/mL    Comment: Performed at [facility] [address], [place], [place] [address]  CBG monitoring, ED     Status: Abnormal   Collection Time: 04/16/18 12:49 AM  Result Value Ref Range   Glucose-Capillary 177 (H) 70 - 99 mg/dL  Lactic acid, plasma     Status: Abnormal   Collection Time: 04/16/18 12:51 AM  Result Value Ref Range   Lactic Acid, Venous 2.2 (HH) 0.5 - 1.9 mmol/L    Comment: CRITICAL RESULT CALLED TO, READ BACK BY AND VERIFIED WITH: TOBIAS,M RN 04/16/2018 0130 JORDANS Performed at [facility], [place] [address] [place], [place] [address]   Lactic acid, plasma     Status: Abnormal   Collection Time: 04/16/18  3:18 AM  Result Value Ref Range   Lactic Acid, Venous 2.6 (HH) 0.5 - 1.9 mmol/L    Comment: CRITICAL RESULT CALLED TO, READ BACK BY AND VERIFIED WITH: Kasandra Knudsen [phone]  [facility] Performed at [facility], [address], [place], [place] [address]   Urinalysis, Routine w reflex microscopic     Status: Abnormal   Collection Time: 04/16/18  4:28 AM  Result Value Ref Range   Color, Urine YELLOW YELLOW   APPearance CLEAR CLEAR   Specific Gravity, Urine 1.016 1.005 - 1.030   pH 8.0 5.0 - 8.0   Glucose, UA 50 (A) NEGATIVE mg/dL   Hgb urine dipstick NEGATIVE NEGATIVE   Bilirubin Urine NEGATIVE NEGATIVE   Ketones, ur NEGATIVE NEGATIVE mg/dL   Protein, ur 30 (A) NEGATIVE mg/dL   Nitrite NEGATIVE NEGATIVE   Leukocytes,Ua NEGATIVE NEGATIVE   RBC / HPF 0-5 0 - 5 RBC/hpf   WBC, UA 0-5 0 - 5 WBC/hpf   Bacteria, UA NONE SEEN NONE SEEN   Squamous Epithelial / LPF 0-5 0 - 5    Comment: Performed at [facility], [place] [address], [place], [place] [address]  [place]     Status: None   Collection Time: 04/16/18  5:39 AM  Result Value Ref Range   Total CK 349 49 - 397 U/L    Comment: Performed at [facility], [place] [address] [place], [place] [address]  Comprehensive metabolic panel     Status: Abnormal   Collection Time: 04/16/18  5:39 AM  Result Value Ref  Range   Sodium 136 135 - 145 mmol/L   Potassium 4.8 3.5 - 5.1 mmol/L   Chloride 103 98 - 111 mmol/L   CO2 22 22 - 32 mmol/L   Glucose, Bld 180 (H) 70 - 99 mg/dL   BUN 23 8 - 23 mg/dL   Creatinine, Ser 1.76 (H) 0.61 - 1.24 mg/dL   Calcium 9.1 8.9 - 10.3 mg/dL   Total Protein 6.2 (L) 6.5 - 8.1 g/dL   Albumin 3.6 3.5 - 5.0 g/dL   AST 24 15 - 41 U/L   ALT 15 0 - 44 U/L   Alkaline Phosphatase 30 (L) 38 - 126 U/L   Total Bilirubin 0.4 0.3 - 1.2 mg/dL   GFR calc non Af Amer 35 (L) >60 mL/min   GFR calc Af Amer 41 (L) >60 mL/min   Anion gap 11 5 - 15    Comment: Performed at [facility] [address] [place], [place] [address]  Troponin I - ONCE - STAT     Status: None   Collection Time: 04/16/18  5:39 AM  Result Value Ref Range   Troponin I <0.03 <0.03 ng/mL    Comment: Performed at  [facility] [address], [place], [place] [address]  Lactic acid, plasma     Status: Abnormal   Collection Time: 04/16/18  5:39 AM  Result Value Ref Range   Lactic Acid, Venous 2.1 (HH) 0.5 - 1.9 mmol/L    Comment: CRITICAL RESULT CALLED TO, READ BACK BY AND VERIFIED WITH: TOBIAS.M RN 04/16/2018 6546 JORDANS Performed at [facility], [place] [address], [place], [place] [address]    Ct Head Wo Contrast  Result Date: 04/16/2018 CLINICAL DATA:  Syncopal episode, possible seizure EXAM: CT HEAD WITHOUT CONTRAST CT CERVICAL SPINE WITHOUT CONTRAST TECHNIQUE: Multidetector CT imaging of the head and cervical spine was performed following the standard protocol without intravenous contrast. Multiplanar CT image reconstructions of the cervical spine were also generated. COMPARISON:  CT 05/22/2011 FINDINGS: CT HEAD FINDINGS Brain: No acute territorial infarction, hemorrhage or intracranial mass. Mild small vessel ischemic changes of the white matter. Mild atrophy. Stable ventricle size. Vascular: No hyperdense vessels.  Carotid vascular calcification Skull: Normal. Negative for fracture or focal lesion. Sinuses/Orbits: No acute finding. Other: None CT CERVICAL SPINE FINDINGS Alignment: Trace retrolisthesis C4 on C5. Facet alignment within normal limits. Skull base and vertebrae: No acute fracture. No primary bone lesion or focal pathologic process. Soft tissues and spinal canal: No prevertebral fluid or swelling. No visible canal hematoma. Disc levels: Moderate to marked degenerative change C4-C5 with mild degenerative change at C5-C6. Bilateral foraminal narrowing at C4-C5. Upper chest: Negative. Other: Partially calcified nodule in the left lung apex, no change IMPRESSION: 1. No CT evidence for acute intracranial abnormality. Mild atrophy and small vessel ischemic changes of the white matter 2. Degenerative changes of the cervical spine. No acute osseous abnormality Electronically Signed   By: Donavan Foil M.D.   On: 04/16/2018 01:36   Ct Cervical Spine Wo Contrast  Result Date: 04/16/2018 CLINICAL DATA:  Syncopal episode, possible seizure EXAM: CT HEAD WITHOUT CONTRAST CT CERVICAL SPINE WITHOUT CONTRAST TECHNIQUE: Multidetector CT imaging of the head and cervical spine was performed following the standard protocol without intravenous contrast. Multiplanar CT image reconstructions of the cervical spine were also generated. COMPARISON:  CT 05/22/2011 FINDINGS: CT HEAD FINDINGS Brain: No acute territorial infarction, hemorrhage or intracranial mass. Mild small vessel ischemic changes of  the white matter. Mild atrophy. Stable ventricle size. Vascular: No hyperdense vessels.  Carotid vascular calcification Skull: Normal. Negative for fracture or focal lesion. Sinuses/Orbits: No acute finding. Other: None CT CERVICAL SPINE FINDINGS Alignment: Trace retrolisthesis C4 on C5. Facet alignment within normal limits. Skull base and vertebrae: No acute fracture. No primary bone lesion or focal pathologic process. Soft tissues and spinal canal: No prevertebral fluid or swelling. No visible canal hematoma. Disc levels: Moderate to marked degenerative change C4-C5 with mild degenerative change at C5-C6. Bilateral foraminal narrowing at C4-C5. Upper chest: Negative. Other: Partially calcified nodule in the left lung apex, no change IMPRESSION: 1. No CT evidence for acute intracranial abnormality. Mild atrophy and small vessel ischemic changes of the white matter 2. Degenerative changes of the cervical spine. No acute osseous abnormality Electronically Signed   By: Donavan Foil M.D.   On: 04/16/2018 01:36    Pending Labs Unresulted Labs (From admission, onward)   None      Vitals/Pain Today's Vitals   04/16/18 0600 04/16/18 0630 04/16/18 0730 04/16/18 0800  BP: (!) 152/65 (!) 152/66 (!) 151/67 (!) 145/67  Pulse: 68   75  Resp: 18 14 19 16   Temp:      TempSrc:      SpO2: 92%   94%  Weight:      Height:       PainSc:        Isolation Precautions No active isolations  Medications Medications  sodium chloride flush (NS) 0.9 % injection 3 mL (3 mLs Intravenous Given 04/16/18 0028)  ondansetron (ZOFRAN) injection 4 mg (4 mg Intravenous Given 04/16/18 0814)    Mobility walks Moderate fall risk   Focused Assessments Cardiac Assessment Handoff:  Cardiac Rhythm: Normal sinus rhythm Lab Results  Component Value Date   CKTOTAL 349 04/16/2018   CKMB 5.3 (H) 03/29/2011   TROPONINI <0.03 04/16/2018   Lab Results  Component Value Date   DDIMER 0.81 (H) 08/26/2012   Does the Patient currently have chest pain? No     R Recommendations: See Admitting Provider Note  Report given to:   Additional Notes: pt had a 6 sec pause in ED , cards to see for possible pacemaker/ v/s meds

## 2018-04-17 ENCOUNTER — Encounter (HOSPITAL_COMMUNITY)
Admission: EM | Disposition: A | Discharge status: Home / Self Care | Payer: Self-pay | Source: Home / Self Care | Attending: Internal Medicine

## 2018-04-17 DIAGNOSIS — I442 Atrioventricular block, complete: Secondary | ICD-10-CM | POA: Diagnosis present

## 2018-04-17 DIAGNOSIS — I455 Other specified heart block: Secondary | ICD-10-CM

## 2018-04-17 DIAGNOSIS — I2510 Atherosclerotic heart disease of native coronary artery without angina pectoris: Secondary | ICD-10-CM

## 2018-04-17 DIAGNOSIS — R55 Syncope and collapse: Secondary | ICD-10-CM | POA: Diagnosis not present

## 2018-04-17 DIAGNOSIS — F329 Major depressive disorder, single episode, unspecified: Secondary | ICD-10-CM

## 2018-04-17 DIAGNOSIS — I480 Paroxysmal atrial fibrillation: Secondary | ICD-10-CM | POA: Diagnosis not present

## 2018-04-17 DIAGNOSIS — N183 Chronic kidney disease, stage 3 (moderate): Secondary | ICD-10-CM | POA: Diagnosis not present

## 2018-04-17 DIAGNOSIS — G894 Chronic pain syndrome: Secondary | ICD-10-CM

## 2018-04-17 DIAGNOSIS — N400 Enlarged prostate without lower urinary tract symptoms: Secondary | ICD-10-CM

## 2018-04-17 HISTORY — PX: PACEMAKER IMPLANT: EP1218

## 2018-04-17 LAB — SURGICAL PCR SCREEN
MRSA, PCR: NEGATIVE
Staphylococcus aureus: NEGATIVE

## 2018-04-17 LAB — BASIC METABOLIC PANEL
Anion gap: 10 (ref 5–15)
BUN: 36 mg/dL — ABNORMAL HIGH (ref 8–23)
CALCIUM: 9 mg/dL (ref 8.9–10.3)
CO2: 22 mmol/L (ref 22–32)
Chloride: 106 mmol/L (ref 98–111)
Creatinine, Ser: 1.93 mg/dL — ABNORMAL HIGH (ref 0.61–1.24)
GFR calc Af Amer: 36 mL/min — ABNORMAL LOW (ref >60)
GFR calc non Af Amer: 31 mL/min — ABNORMAL LOW (ref >60)
Glucose, Bld: 161 mg/dL — ABNORMAL HIGH (ref 70–99)
Potassium: 5.5 mmol/L — ABNORMAL HIGH (ref 3.5–5.1)
Sodium: 138 mmol/L (ref 135–145)

## 2018-04-17 LAB — CBC
HCT: 40.9 % (ref 39.0–52.0)
Hemoglobin: 13.9 g/dL (ref 13.0–17.0)
MCH: 30.6 pg (ref 26.0–34.0)
MCHC: 34 g/dL (ref 30.0–36.0)
MCV: 90.1 fL (ref 80.0–100.0)
Platelets: 164 10*3/uL (ref 150–400)
RBC: 4.54 MIL/uL (ref 4.22–5.81)
RDW: 12.8 % (ref 11.5–15.5)
WBC: 13.3 10*3/uL — ABNORMAL HIGH (ref 4.0–10.5)
nRBC: 0 % (ref 0.0–0.2)

## 2018-04-17 SURGERY — PACEMAKER IMPLANT

## 2018-04-17 MED ORDER — CHLORHEXIDINE GLUCONATE 4 % EX LIQD
60.0000 mL | Freq: Once | CUTANEOUS | Status: AC
  Administered 2018-04-17: 4 via TOPICAL
  Filled 2018-04-17: qty 60

## 2018-04-17 MED ORDER — HEPARIN (PORCINE) IN NACL 1000-0.9 UT/500ML-% IV SOLN
INTRAVENOUS | Status: DC | PRN
  Administered 2018-04-17: 500 mL

## 2018-04-17 MED ORDER — SODIUM CHLORIDE 0.9 % IV SOLN
INTRAVENOUS | Status: AC
  Filled 2018-04-17: qty 2

## 2018-04-17 MED ORDER — HEPARIN (PORCINE) IN NACL 1000-0.9 UT/500ML-% IV SOLN
INTRAVENOUS | Status: AC
  Filled 2018-04-17: qty 500

## 2018-04-17 MED ORDER — HYDRALAZINE HCL 20 MG/ML IJ SOLN: 10.0000 mg | Freq: Four times a day (QID) | INTRAMUSCULAR | Status: DC | PRN

## 2018-04-17 MED ORDER — LIDOCAINE HCL (PF) 1 % IJ SOLN
INTRAMUSCULAR | Status: DC | PRN
  Administered 2018-04-17: 45 mL

## 2018-04-17 MED ORDER — CEFAZOLIN SODIUM-DEXTROSE 1-4 GM/50ML-% IV SOLN
1.0000 g | Freq: Four times a day (QID) | INTRAVENOUS | Status: AC
  Administered 2018-04-17 – 2018-04-18 (×3): 1 g via INTRAVENOUS
  Filled 2018-04-17 (×3): qty 50

## 2018-04-17 MED ORDER — SODIUM CHLORIDE 0.9 % IV SOLN: INTRAVENOUS | Status: AC

## 2018-04-17 MED ORDER — ONDANSETRON HCL 4 MG/2ML IJ SOLN: 4.0000 mg | Freq: Four times a day (QID) | INTRAMUSCULAR | Status: DC | PRN

## 2018-04-17 MED ORDER — SODIUM CHLORIDE 0.9 % IV SOLN: 250.0000 mL | INTRAVENOUS | Status: DC

## 2018-04-17 MED ORDER — SODIUM CHLORIDE 0.9 % IV SOLN
INTRAVENOUS | Status: DC
  Administered 2018-04-17: 08:00:00 via INTRAVENOUS

## 2018-04-17 MED ORDER — ACETAMINOPHEN 325 MG PO TABS
325.0000 mg | ORAL_TABLET | ORAL | Status: DC | PRN
Start: 2018-04-17 — End: 2018-04-18

## 2018-04-17 MED ORDER — CEFAZOLIN SODIUM-DEXTROSE 2-4 GM/100ML-% IV SOLN
2.0000 g | INTRAVENOUS | Status: AC
  Administered 2018-04-17: 2 g via INTRAVENOUS

## 2018-04-17 MED ORDER — CEFAZOLIN SODIUM-DEXTROSE 2-4 GM/100ML-% IV SOLN
INTRAVENOUS | Status: AC
  Filled 2018-04-17: qty 100

## 2018-04-17 MED ORDER — SODIUM CHLORIDE 0.9 % IV SOLN
250.0000 mL | INTRAVENOUS | Status: DC
  Administered 2018-04-17: 75 mL via INTRAVENOUS

## 2018-04-17 MED ORDER — SODIUM CHLORIDE 0.9% FLUSH
3.0000 mL | Freq: Two times a day (BID) | INTRAVENOUS | Status: DC
  Administered 2018-04-17: 3 mL via INTRAVENOUS

## 2018-04-17 MED ORDER — SODIUM CHLORIDE 0.9 % IV SOLN
80.0000 mg | INTRAVENOUS | Status: AC
  Administered 2018-04-17: 80 mg
  Filled 2018-04-17: qty 2

## 2018-04-17 MED ORDER — SODIUM CHLORIDE 0.9% FLUSH: 3.0000 mL | INTRAVENOUS | Status: DC | PRN

## 2018-04-17 MED ORDER — CHLORHEXIDINE GLUCONATE 4 % EX LIQD: 60.0000 mL | Freq: Once | CUTANEOUS | Status: AC

## 2018-04-17 MED ORDER — LIDOCAINE HCL (PF) 1 % IJ SOLN
INTRAMUSCULAR | Status: AC
  Filled 2018-04-17: qty 60

## 2018-04-17 SURGICAL SUPPLY — 9 items
CABLE SURGICAL S-101-97-12 (CABLE) ×3 IMPLANT
HEMOSTAT SURGICEL 2X4 FIBR (HEMOSTASIS) ×3 IMPLANT
IPG PACE AZUR XT DR MRI W1DR01 (Pacemaker) ×1 IMPLANT
LEAD CAPSURE NOVUS 5076-52CM (Lead) ×3 IMPLANT
LEAD CAPSURE NOVUS 5076-58CM (Lead) ×3 IMPLANT
PACE AZURE XT DR MRI W1DR01 (Pacemaker) ×3 IMPLANT
PAD PRO RADIOLUCENT 2001M-C (PAD) ×3 IMPLANT
SHEATH CLASSIC 7F (SHEATH) ×6 IMPLANT
TRAY PACEMAKER INSERTION (PACKS) ×3 IMPLANT

## 2018-04-17 NOTE — Progress Notes (Signed)
PROGRESS NOTE    Gary [name]  HQP:[id number] DOB: [birth year] DOA: 04/15/2018 PCP: Pixie Casino, MD   Brief Narrative:  Per admitting physician: Gary [name] is a 85 y.o. male with past history of CAD, non-STEMI in 2011 cath noted occluded circumflex and collaterals on medical management, remote history of atrial fibrillation and palpitations, anxiety, chronic pain was in his usual state of health yesterday.  According to patient's wife he had a bowel movement subsequently had loss of consciousness getting off the commode, EMS was called patient refused transportation to the ED, 30 to 40 minutes later he was noted to be briefly unresponsive with some mild shakes, subsequently regained consciousness and was brought to the ED, initial work-up was unremarkable. -Subsequently patient had an 8-second sinus pause/asystole associated with loss of consciousness. -External pacer pads were placed -TRH was called for admission, of note patient is on low-dose bisoprolol 2.5 mg daily, last taken yesterday, no recent changes in medications.   Assessment & Plan:   Principal Problem:   Syncope and collapse Active Problems:   Essential hypertension   CKD (chronic kidney disease), stage III (HCC)   Anxiety state   PAF (paroxysmal atrial fibrillation) (HCC)   CAD in native artery   History of MI (myocardial infarction)   Sinus pause   Syncope and collapse -I agree this was likely due to underlying conduction abnormalities as evidenced by long sinus pauses.  Agree with discontinuing beta-blocker.  Monitoring on telemetry.  Anticipate patient will need pacemaker placement.  Electrophysiology evaluating for such TSH is normal, 2D echocardiogram in 06/2016 noted preserved EF, no significant valvular disease,   follow-up echo was pending    CKD (chronic kidney disease), stage III (HCC) -Stable, creatinine 1.93  History of CAD -History of non-STEMI and remote cath in 2011 noted occluded circumflex  and collaterals on medical management -Discontinue beta-blocker as noted above, continue aspirin and statin  History of depression/anxiety -Continue Celexa no evidence of acute decompensation  History of chronic pain -Continue home regimen of Ultracet and gabapentin -Decreased dose for GFR  History of BPH -Continue Flomax, Proscar  DVT prophylaxis: Lovenox SQ  Code Status: DNR    Code Status Orders  (From admission, onward)         Start     Ordered   04/16/18 1311  Do not attempt resuscitation (DNR)  Continuous    Question Answer Comment  In the event of cardiac or respiratory ARREST Do not call a "code blue"   In the event of cardiac or respiratory ARREST Do not perform Intubation, CPR, defibrillation or ACLS   In the event of cardiac or respiratory ARREST Use medication by any route, position, wound care, and other measures to relive pain and suffering. May use oxygen, suction and manual treatment of airway obstruction as needed for comfort.      04/16/18 1311        Code Status History    Date Active Date Inactive Code Status Order ID Comments User Context   08/03/2013 1525 08/18/2013 1851 Full Code [id number]  Hessie Dibble, MD Inpatient   04/12/2011 1734 04/18/2011 1325 Full Code [id number]  Earle Gell, RN Inpatient   03/28/2011 2343 03/30/2011 1852 Full Code [id number]  Hardie Pulley, RN Inpatient    Advance Directive Documentation     Most Recent Value  Type of Advance Directive  Healthcare Power of [place], Living will  Pre-existing out of facility DNR order (yellow form or pink MOST  form)  -  "MOST" Form in Place?  -     Family Communication: None present Disposition Plan:   Probable pacemaker placement today, likely discharge in 1 day Consults called: Electrophysiology Admission status: Inpatient   Consultants:   Electrophysiology  Procedures:  Ct Head Wo Contrast  Result Date: 04/16/2018 CLINICAL DATA:  Syncopal episode, possible seizure  EXAM: CT HEAD WITHOUT CONTRAST CT CERVICAL SPINE WITHOUT CONTRAST TECHNIQUE: Multidetector CT imaging of the head and cervical spine was performed following the standard protocol without intravenous contrast. Multiplanar CT image reconstructions of the cervical spine were also generated. COMPARISON:  CT 05/22/2011 FINDINGS: CT HEAD FINDINGS Brain: No acute territorial infarction, hemorrhage or intracranial mass. Mild small vessel ischemic changes of the white matter. Mild atrophy. Stable ventricle size. Vascular: No hyperdense vessels.  Carotid vascular calcification Skull: Normal. Negative for fracture or focal lesion. Sinuses/Orbits: No acute finding. Other: None CT CERVICAL SPINE FINDINGS Alignment: Trace retrolisthesis C4 on C5. Facet alignment within normal limits. Skull base and vertebrae: No acute fracture. No primary bone lesion or focal pathologic process. Soft tissues and spinal canal: No prevertebral fluid or swelling. No visible canal hematoma. Disc levels: Moderate to marked degenerative change C4-C5 with mild degenerative change at C5-C6. Bilateral foraminal narrowing at C4-C5. Upper chest: Negative. Other: Partially calcified nodule in the left lung apex, no change IMPRESSION: 1. No CT evidence for acute intracranial abnormality. Mild atrophy and small vessel ischemic changes of the white matter 2. Degenerative changes of the cervical spine. No acute osseous abnormality Electronically Signed   By: Donavan Foil M.D.   On: 04/16/2018 01:36   Ct Cervical Spine Wo Contrast  Result Date: 04/16/2018 CLINICAL DATA:  Syncopal episode, possible seizure EXAM: CT HEAD WITHOUT CONTRAST CT CERVICAL SPINE WITHOUT CONTRAST TECHNIQUE: Multidetector CT imaging of the head and cervical spine was performed following the standard protocol without intravenous contrast. Multiplanar CT image reconstructions of the cervical spine were also generated. COMPARISON:  CT 05/22/2011 FINDINGS: CT HEAD FINDINGS Brain: No acute  territorial infarction, hemorrhage or intracranial mass. Mild small vessel ischemic changes of the white matter. Mild atrophy. Stable ventricle size. Vascular: No hyperdense vessels.  Carotid vascular calcification Skull: Normal. Negative for fracture or focal lesion. Sinuses/Orbits: No acute finding. Other: None CT CERVICAL SPINE FINDINGS Alignment: Trace retrolisthesis C4 on C5. Facet alignment within normal limits. Skull base and vertebrae: No acute fracture. No primary bone lesion or focal pathologic process. Soft tissues and spinal canal: No prevertebral fluid or swelling. No visible canal hematoma. Disc levels: Moderate to marked degenerative change C4-C5 with mild degenerative change at C5-C6. Bilateral foraminal narrowing at C4-C5. Upper chest: Negative. Other: Partially calcified nodule in the left lung apex, no change IMPRESSION: 1. No CT evidence for acute intracranial abnormality. Mild atrophy and small vessel ischemic changes of the white matter 2. Degenerative changes of the cervical spine. No acute osseous abnormality Electronically Signed   By: Donavan Foil M.D.   On: 04/16/2018 01:36     Antimicrobials:   Cefazolin gentamicin x1 on call preop   Subjective: Resting comfortably in bed no acute events.  Denies any syncopal episodes overnight, denies chest pain, awaiting pacemaker placement  Objective: Vitals:   04/16/18 2008 04/17/18 0006 04/17/18 0544 04/17/18 0900  BP: 136/70 135/63 (!) 172/97 (!) 166/76  Pulse: 62 (!) 54 72   Resp:      Temp: 97.9 F (36.6 C) 97.7 F (36.5 C) (!) 97.3 F (36.3 C)   TempSrc: Oral Oral  Oral   SpO2: 94% 95% 97%   Weight:      Height:        Intake/Output Summary (Last 24 hours) at 04/17/2018 1111 Last data filed at 04/17/2018 0600 Gross per 24 hour  Intake 720 ml  Output 0 ml  Net 720 ml   Filed Weights   04/16/18 0006  Weight: 95.3 kg    Examination:  General exam: Appears calm and comfortable  Respiratory system: Clear to  auscultation. Respiratory effort normal. Cardiovascular system: Bradycardia, no murmurs noted  gastrointestinal system: Abdomen is nondistended, soft and nontender. No organomegaly or masses felt. Normal bowel sounds heard. Central nervous system: Alert and oriented. No focal neurological deficits. Extremities: Symmetric 5 x 5 power. Skin: No rashes, lesions or ulcers Psychiatry: Judgement and insight appear normal. Mood & affect appropriate.     Data Reviewed: I have personally reviewed following labs and imaging studies  CBC: Recent Labs  Lab 04/16/18 0034 04/17/18 0356  WBC 5.5 13.3*  NEUTROABS 4.9  --   HGB 13.9 13.9  HCT 43.5 40.9  MCV 91.8 90.1  PLT 154 132   Basic Metabolic Panel: Recent Labs  Lab 04/16/18 0034 04/16/18 0539 04/17/18 0356  NA 137 136 138  K 4.9 4.8 5.5*  CL 102 103 106  CO2 21* 22 22  GLUCOSE 205* 180* 161*  BUN 22 23 36*  CREATININE 1.80* 1.76* 1.93*  CALCIUM 9.2 9.1 9.0   GFR: Estimated Creatinine Clearance: 31.9 mL/min (A) (by C-G formula based on SCr of 1.93 mg/dL (H)). Liver Function Tests: Recent Labs  Lab 04/16/18 0034 04/16/18 0539  AST 29 24  ALT 18 15  ALKPHOS 32* 30*  BILITOT 0.6 0.4  PROT 6.9 6.2*  ALBUMIN 4.0 3.6   No results for input(s): LIPASE, AMYLASE in the last 168 hours. No results for input(s): AMMONIA in the last 168 hours. Coagulation Profile: No results for input(s): INR, PROTIME in the last 168 hours. Cardiac Enzymes: Recent Labs  Lab 04/16/18 0034 04/16/18 0539  CKTOTAL 375 349  TROPONINI <0.03 <0.03   BNP (last 3 results) No results for input(s): PROBNP in the last 8760 hours. HbA1C: No results for input(s): HGBA1C in the last 72 hours. CBG: Recent Labs  Lab 04/16/18 0049  GLUCAP 177*   Lipid Profile: No results for input(s): CHOL, HDL, LDLCALC, TRIG, CHOLHDL, LDLDIRECT in the last 72 hours. Thyroid Function Tests: Recent Labs    04/16/18 1359  TSH 0.899   Anemia Panel: No results  for input(s): VITAMINB12, FOLATE, FERRITIN, TIBC, IRON, RETICCTPCT in the last 72 hours. Sepsis Labs: Recent Labs  Lab 04/16/18 0051 04/16/18 0318 04/16/18 0539  LATICACIDVEN 2.2* 2.6* 2.1*    Recent Results (from the past 240 hour(s))  Surgical PCR screen     Status: None   Collection Time: 04/17/18  7:45 AM  Result Value Ref Range Status   MRSA, PCR NEGATIVE NEGATIVE Final   Staphylococcus aureus NEGATIVE NEGATIVE Final    Comment: (NOTE) The Xpert SA Assay (FDA approved for NASAL specimens in patients 58 years of age and older), is one component of a comprehensive surveillance program. It is not intended to diagnose infection nor to guide or monitor treatment. Performed at [facility], [place] [address], [place], [place] [address]          Radiology Studies: Ct Head Wo Contrast  Result Date: 04/16/2018 CLINICAL DATA:  Syncopal episode, possible seizure EXAM: CT HEAD WITHOUT CONTRAST CT CERVICAL SPINE  WITHOUT CONTRAST TECHNIQUE: Multidetector CT imaging of the head and cervical spine was performed following the standard protocol without intravenous contrast. Multiplanar CT image reconstructions of the cervical spine were also generated. COMPARISON:  CT 05/22/2011 FINDINGS: CT HEAD FINDINGS Brain: No acute territorial infarction, hemorrhage or intracranial mass. Mild small vessel ischemic changes of the white matter. Mild atrophy. Stable ventricle size. Vascular: No hyperdense vessels.  Carotid vascular calcification Skull: Normal. Negative for fracture or focal lesion. Sinuses/Orbits: No acute finding. Other: None CT CERVICAL SPINE FINDINGS Alignment: Trace retrolisthesis C4 on C5. Facet alignment within normal limits. Skull base and vertebrae: No acute fracture. No primary bone lesion or focal pathologic process. Soft tissues and spinal canal: No prevertebral fluid or swelling. No visible canal hematoma. Disc levels: Moderate to marked degenerative change C4-C5 with mild  degenerative change at C5-C6. Bilateral foraminal narrowing at C4-C5. Upper chest: Negative. Other: Partially calcified nodule in the left lung apex, no change IMPRESSION: 1. No CT evidence for acute intracranial abnormality. Mild atrophy and small vessel ischemic changes of the white matter 2. Degenerative changes of the cervical spine. No acute osseous abnormality Electronically Signed   By: Donavan Foil M.D.   On: 04/16/2018 01:36   Ct Cervical Spine Wo Contrast  Result Date: 04/16/2018 CLINICAL DATA:  Syncopal episode, possible seizure EXAM: CT HEAD WITHOUT CONTRAST CT CERVICAL SPINE WITHOUT CONTRAST TECHNIQUE: Multidetector CT imaging of the head and cervical spine was performed following the standard protocol without intravenous contrast. Multiplanar CT image reconstructions of the cervical spine were also generated. COMPARISON:  CT 05/22/2011 FINDINGS: CT HEAD FINDINGS Brain: No acute territorial infarction, hemorrhage or intracranial mass. Mild small vessel ischemic changes of the white matter. Mild atrophy. Stable ventricle size. Vascular: No hyperdense vessels.  Carotid vascular calcification Skull: Normal. Negative for fracture or focal lesion. Sinuses/Orbits: No acute finding. Other: None CT CERVICAL SPINE FINDINGS Alignment: Trace retrolisthesis C4 on C5. Facet alignment within normal limits. Skull base and vertebrae: No acute fracture. No primary bone lesion or focal pathologic process. Soft tissues and spinal canal: No prevertebral fluid or swelling. No visible canal hematoma. Disc levels: Moderate to marked degenerative change C4-C5 with mild degenerative change at C5-C6. Bilateral foraminal narrowing at C4-C5. Upper chest: Negative. Other: Partially calcified nodule in the left lung apex, no change IMPRESSION: 1. No CT evidence for acute intracranial abnormality. Mild atrophy and small vessel ischemic changes of the white matter 2. Degenerative changes of the cervical spine. No acute osseous  abnormality Electronically Signed   By: Donavan Foil M.D.   On: 04/16/2018 01:36        Scheduled Meds: . aspirin EC  81 mg Oral Daily  . citalopram  40 mg Oral Daily  . doxazosin  4 mg Oral QHS  . enoxaparin (LOVENOX) injection  40 mg Subcutaneous Q24H  . gabapentin  300 mg Oral TID  . gentamicin irrigation  80 mg Irrigation On Call  . simvastatin  40 mg Oral q1800  . sodium chloride flush  3 mL Intravenous Q12H  . tamsulosin  0.4 mg Oral QPC supper   Continuous Infusions: . sodium chloride 75 mL/hr at 04/17/18 0808  . sodium chloride 75 mL (04/17/18 0817)  .  ceFAZolin (ANCEF) IV       LOS: 1 day    Time spent: 35 minutes    Nicolette Bang, MD [facility]  If 7PM-7AM, please contact night-coverage  04/17/2018, 11:11 AM

## 2018-04-17 NOTE — Progress Notes (Addendum)
Progress Note  Patient Name: Gary [name] Date of Encounter: 04/17/2018  Primary Cardiologist: Dr. Debara Pickett  Subjective   Last night/this AM, had a few "spells" of suddenly getting very warm, has a sensation of need to cough, and then weak, near syncope, since here, he does not think he has fainted completely  Inpatient Medications    Scheduled Meds: . aspirin EC  81 mg Oral Daily  . chlorhexidine  60 mL Topical Once  . chlorhexidine  60 mL Topical Once  . citalopram  40 mg Oral Daily  . doxazosin  4 mg Oral QHS  . enoxaparin (LOVENOX) injection  40 mg Subcutaneous Q24H  . gabapentin  300 mg Oral TID  . gentamicin irrigation  80 mg Irrigation On Call  . simvastatin  40 mg Oral q1800  . sodium chloride flush  3 mL Intravenous Q12H  . tamsulosin  0.4 mg Oral QPC supper   Continuous Infusions: . sodium chloride    . sodium chloride    .  ceFAZolin (ANCEF) IV     PRN Meds: acetaminophen **OR** acetaminophen, hydrALAZINE, ondansetron **OR** ondansetron (ZOFRAN) IV, sodium chloride flush, traMADol-acetaminophen   Vital Signs    Vitals:   04/16/18 1130 04/16/18 2008 04/17/18 0006 04/17/18 0544  BP: (!) 153/72 136/70 135/63 (!) 172/97  Pulse: 85 62 (!) 54 72  Resp:      Temp: 98.9 F (37.2 C) 97.9 F (36.6 C) 97.7 F (36.5 C) (!) 97.3 F (36.3 C)  TempSrc: Oral Oral Oral Oral  SpO2: 95% 94% 95% 97%  Weight:      Height:        Intake/Output Summary (Last 24 hours) at 04/17/2018 0746 Last data filed at 04/17/2018 0600 Gross per 24 hour  Intake 720 ml  Output 0 ml  Net 720 ml   Last 3 Weights 04/16/2018 01/16/2018 10/21/2017  Weight (lbs) 210 lb 213 lb 210 lb  Weight (kg) 95.255 kg 96.616 kg 95.255 kg      Telemetry    SR, occ PACs, c/w episodes of CHB with 3-12 second pauses - Personally Reviewed  ECG    No new EKGs - Personally Reviewed  Physical Exam   GEN: No acute distress.   Neck: No JVD Cardiac: RRR, no murmurs, rubs, or gallops.  Respiratory: CTA  b/l. GI: Soft, nontender, non-distended  MS: No edema; No deformity. Neuro:  Nonfocal  Psych: Normal affect   Labs    Chemistry Recent Labs  Lab 04/16/18 0034 04/16/18 0539 04/17/18 0356  NA 137 136 138  K 4.9 4.8 5.5*  CL 102 103 106  CO2 21* 22 22  GLUCOSE 205* 180* 161*  BUN 22 23 36*  CREATININE 1.80* 1.76* 1.93*  CALCIUM 9.2 9.1 9.0  PROT 6.9 6.2*  --   ALBUMIN 4.0 3.6  --   AST 29 24  --   ALT 18 15  --   ALKPHOS 32* 30*  --   BILITOT 0.6 0.4  --   GFRNONAA 34* 35* 31*  GFRAA 39* 41* 36*  ANIONGAP 14 11 10      Hematology Recent Labs  Lab 04/16/18 0034 04/17/18 0356  WBC 5.5 13.3*  RBC 4.74 4.54  HGB 13.9 13.9  HCT 43.5 40.9  MCV 91.8 90.1  MCH 29.3 30.6  MCHC 32.0 34.0  RDW 12.4 12.8  PLT 154 164    Cardiac Enzymes Recent Labs  Lab 04/16/18 0034 04/16/18 0539  TROPONINI <0.03 <0.03   No  results for input(s): TROPIPOC in the last 168 hours.   BNPNo results for input(s): BNP, PROBNP in the last 168 hours.   DDimer No results for input(s): DDIMER in the last 168 hours.   Radiology    Ct Head Wo Contrast Result Date: 04/16/2018 CLINICAL DATA:  Syncopal episode, possible seizure EXAM: CT HEAD WITHOUT CONTRAST CT CERVICAL SPINE WITHOUT CONTRAST TECHNIQUE: Multidetector CT imaging of the head and cervical spine was performed following the standard protocol without intravenous contrast. Multiplanar CT image reconstructions of the cervical spine were also generated. COMPARISON:  CT 05/22/2011 FINDINGS: CT HEAD FINDINGS Brain: No acute territorial infarction, hemorrhage or intracranial mass. Mild small vessel ischemic changes of the white matter. Mild atrophy. Stable ventricle size. Vascular: No hyperdense vessels.  Carotid vascular calcification Skull: Normal. Negative for fracture or focal lesion. Sinuses/Orbits: No acute finding. Other: None CT CERVICAL SPINE FINDINGS Alignment: Trace retrolisthesis C4 on C5. Facet alignment within normal limits. Skull  base and vertebrae: No acute fracture. No primary bone lesion or focal pathologic process. Soft tissues and spinal canal: No prevertebral fluid or swelling. No visible canal hematoma. Disc levels: Moderate to marked degenerative change C4-C5 with mild degenerative change at C5-C6. Bilateral foraminal narrowing at C4-C5. Upper chest: Negative. Other: Partially calcified nodule in the left lung apex, no change IMPRESSION: 1. No CT evidence for acute intracranial abnormality. Mild atrophy and small vessel ischemic changes of the white matter 2. Degenerative changes of the cervical spine. No acute osseous abnormality Electronically Signed   By: Donavan Foil M.D.   On: 04/16/2018 01:36     Ct Cervical Spine Wo Contrast Result Date: 04/16/2018 CLINICAL DATA:  Syncopal episode, possible seizure EXAM: CT HEAD WITHOUT CONTRAST CT CERVICAL SPINE WITHOUT CONTRAST TECHNIQUE: Multidetector CT imaging of the head and cervical spine was performed following the standard protocol without intravenous contrast. Multiplanar CT image reconstructions of the cervical spine were also generated. COMPARISON:  CT 05/22/2011 FINDINGS: CT HEAD FINDINGS Brain: No acute territorial infarction, hemorrhage or intracranial mass. Mild small vessel ischemic changes of the white matter. Mild atrophy. Stable ventricle size. Vascular: No hyperdense vessels.  Carotid vascular calcification Skull: Normal. Negative for fracture or focal lesion. Sinuses/Orbits: No acute finding. Other: None CT CERVICAL SPINE FINDINGS Alignment: Trace retrolisthesis C4 on C5. Facet alignment within normal limits. Skull base and vertebrae: No acute fracture. No primary bone lesion or focal pathologic process. Soft tissues and spinal canal: No prevertebral fluid or swelling. No visible canal hematoma. Disc levels: Moderate to marked degenerative change C4-C5 with mild degenerative change at C5-C6. Bilateral foraminal narrowing at C4-C5. Upper chest: Negative. Other:  Partially calcified nodule in the left lung apex, no change IMPRESSION: 1. No CT evidence for acute intracranial abnormality. Mild atrophy and small vessel ischemic changes of the white matter 2. Degenerative changes of the cervical spine. No acute osseous abnormality Electronically Signed   By: Donavan Foil M.D.   On: 04/16/2018 01:36    Cardiac Studies   Echo is completed, pending read  06/13/16: TTE Study Conclusions - Left ventricle: The cavity size was normal. Wall thickness was   increased in a pattern of mild LVH. Systolic function was normal.   The estimated ejection fraction was in the range of 55% to 60%.   Wall motion was normal; there were no regional wall motion   abnormalities. Doppler parameters are consistent with abnormal   left ventricular relaxation (grade 1 diastolic dysfunction). - Aortic valve: There was no stenosis. - Mitral  valve: Moderately calcified annulus. There was no   significant regurgitation. - Left atrium: The atrium was mildly dilated. - Right ventricle: The cavity size was normal. Systolic function   was normal. - Tricuspid valve: Peak RV-RA gradient (S): 37 mm Hg. - Systemic veins: IVC was not visualized. Impressions: - Normal LV size with mild LV hypertrophy. EF 55-60%. Normal RV   size and systolic function. No significant valvular   abnormalities. Mild pulmonary hypertension.  01/13/15: stress myoview  The left ventricular ejection fraction is normal (55-65%).  Nuclear stress EF: 55%.  There was no ST segment deviation noted during stress.  This is a low risk study.  Findings consistent with ischemia and prior myocardial infarction.  Low risk stress nuclear study with a small, severe, partially reversible lateral defect consistent with small prior infarct and very mild peri-infarct ischemia; EF 55 with hypokinesis of the high lateral wall.   Patient Profile     85 y.o. male with a hx of CAD (2011 he had a nonSTEMI with an occluded  circumflex artery and collaterals, has been treated medically), sounds like a remote very brief (89minutes) of AFib in the environment of sepsis, CKD (III), HTN, HLD, admitted with recurrent syncope, observed to have CHB, V pauses as long as 12 seconds  Assessment & Plan    1. Syncope 2. V standstill, CHB/pause 10 seconds (x1) w/LOC     A couple others much shorter 2-3 seconds without symptoms     Home bisoprolol, very low dose, though needs to wash out  1/2 life 9-12 hours (shorteds washout is 45hours) He has zoll pads on, admitted to [facility]  Has been about 48 hours without his bisoprolol He continues to have episodes of CHB with profound pauses that are symptomatic  Plan for PPM  Today Dr. Caryl Comes has seen and examined the patient this morning, discussed PPM recommendation, indication/rational.  Discussed the procedure potential risks and benefits, he is agreeable to proceed.   3. CAD     No anginal symptoms of late     Neg Trop     Dr. Curt Bears discussed with Dr. Debara Pickett, yesterday would bee OK with patient off his BB       For questions or updates, please contact [facility] Please consult [email or web address] for contact info under        Signed, Baldwin Jamaica, PA-C  04/17/2018, 7:46 AM     As above The benefits and risks were reviewed including but not limited to death,  perforation, infection, lead dislodgement and device malfunction.  The patient understands agrees and is willing to proceed.  Reviewed with wife

## 2018-04-17 NOTE — Discharge Instructions (Signed)
° ° °  Supplemental Discharge Instructions for  Pacemaker/Defibrillator Patients  Activity No heavy lifting or vigorous activity with your left/right arm for 6 to 8 weeks.  Do not raise your left/right arm above your head for one week.  Gradually raise your affected arm as drawn below.              04/21/2018                04/22/2018                 04/23/2018              04/24/2018 __  NO DRIVING until cleared to at your wound check visit  [place] the wound area clean and dry.  Do not get this area wet for one week. No showers for one week; you may shower on  04/24/2018   . - The tape/steri-strips on your wound will fall off; do not pull them off.  No bandage is needed on the site.  DO  NOT apply any creams, oils, or ointments to the wound area. - If you notice any drainage or discharge from the wound, any swelling or bruising at the site, or you develop a fever > 101? F after you are discharged home, call the office at once.  Special Instructions - You are still able to use cellular telephones; use the ear opposite the side where you have your pacemaker/defibrillator.  Avoid carrying your cellular phone near your device. - When traveling through airports, show security personnel your identification card to avoid being screened in the metal detectors.  Ask the security personnel to use the hand wand. - Avoid arc welding equipment, MRI testing (magnetic resonance imaging), TENS units (transcutaneous nerve stimulators).  Call the office for questions about other devices. - Avoid electrical appliances that are in poor condition or are not properly grounded. - Microwave ovens are safe to be near or to operate.

## 2018-04-17 NOTE — Plan of Care (Signed)
  Problem: Education: Goal: Knowledge of General Education information will improve Description Including pain rating scale, medication(s)/side effects and non-pharmacologic comfort measures Outcome: Progressing   Problem: Health Behavior/Discharge Planning: Goal: Ability to manage health-related needs will improve Outcome: Progressing   Problem: Clinical Measurements: Goal: Ability to maintain clinical measurements within normal limits will improve Outcome: Progressing Goal: Will remain free from infection Outcome: Progressing Goal: Diagnostic test results will improve Outcome: Progressing Goal: Cardiovascular complication will be avoided Outcome: Progressing   Problem: Activity: Goal: Risk for activity intolerance will decrease Outcome: Progressing   Problem: Clinical Measurements: Goal: Respiratory complications will improve Outcome: Completed/Met

## 2018-04-18 ENCOUNTER — Ambulatory Visit (HOSPITAL_COMMUNITY): Payer: Medicare Other

## 2018-04-18 DIAGNOSIS — I10 Essential (primary) hypertension: Secondary | ICD-10-CM

## 2018-04-18 DIAGNOSIS — I442 Atrioventricular block, complete: Secondary | ICD-10-CM | POA: Diagnosis not present

## 2018-04-18 DIAGNOSIS — F411 Generalized anxiety disorder: Secondary | ICD-10-CM

## 2018-04-18 DIAGNOSIS — R55 Syncope and collapse: Secondary | ICD-10-CM | POA: Diagnosis not present

## 2018-04-18 DIAGNOSIS — I129 Hypertensive chronic kidney disease with stage 1 through stage 4 chronic kidney disease, or unspecified chronic kidney disease: Secondary | ICD-10-CM | POA: Diagnosis not present

## 2018-04-18 DIAGNOSIS — I2510 Atherosclerotic heart disease of native coronary artery without angina pectoris: Secondary | ICD-10-CM | POA: Diagnosis not present

## 2018-04-18 DIAGNOSIS — Z66 Do not resuscitate: Secondary | ICD-10-CM | POA: Diagnosis not present

## 2018-04-18 DIAGNOSIS — N183 Chronic kidney disease, stage 3 (moderate): Secondary | ICD-10-CM | POA: Diagnosis not present

## 2018-04-18 MED ORDER — CITALOPRAM HYDROBROMIDE 20 MG PO TABS: 40.0000 mg | ORAL_TABLET | Freq: Every day | ORAL | 0 refills | Status: DC

## 2018-04-18 NOTE — Discharge Summary (Signed)
Physician Discharge Summary  Gary [name] VEL:[id number] DOB: [birth year] DOA: 04/15/2018  PCP: Pixie Casino, MD  Admit date: 04/15/2018 Discharge date: 04/18/2018  Admitted From: Inpatient Disposition: home  Recommendations for Outpatient Follow-up:  1. Follow up with PCP in 1-2 weeks 2. Please obtain BMP/CBC in one week 3. Please follow up on the following pending results:  Home Health:No Equipment/Devices:none  Discharge Condition:Stable CODE STATUS:DNR Diet recommendation: Cardiac diet  Brief/Interim Summary: Per admitting physician: Gary [name] a 85 y.o.malewith past history of CAD, non-STEMI in 2011 cath noted occluded circumflex and collaterals on medical management, remote history of atrial fibrillation and palpitations, anxiety, chronic pain was in his usual state of health yesterday.According to patient's wife he had a bowel movement subsequently had loss of consciousness getting off the commode, EMS was called patient refused transportation to the ED, 30 to 40 minutes later he was noted to be briefly unresponsive with some mild shakes,subsequently regained consciousness and was brought to the ED,initial work-up was unremarkable. -Subsequently patient had an 8-second sinus pause/asystole associated with loss of consciousness. -External pacer pads were placed -TRH was called for admission,of note patient is on low-dose bisoprolol 2.5 mg daily,last taken yesterday, no recent changes in medications.  Hospital course: Syncope and collapse.  Patient's symptoms were consistent with conduction abnormalities as evident by long sinus pause.  His beta-blocker was held initially for washout but because of patient's underlying cardiac disease electrophysiology saw the patient consultation.  TSH was normal and patient's pauses were persistent.  He underwent permanent pacemaker placement without complication.  He will follow-up for wound check in 10 days and with Dr. Caryl Comes  nausea physiology in 3 months.  He will continue his home medications without change per cardiology.  CKD stage III.  Patient creatinine is stable.  While in the hospital would minimize nephrotoxic agents.  He will follow-up with his PCP as an outpatient for continued surveillance.  CAD.  As noted patient has history of non-ST elevation MI with a remote cath in 2001.  He will continue his home medications on discharge include aspirin beta-blocker and statin.  He will follow-up with cardiology as an outpatient for continued surveillance.  Depression with anxiety.  Patient is on Celexa 40 mg p.o. daily.  As noted for patients greater than 14 years old dose to be adjusted to 20 mg secondary to higher doses increasing risk for QT prolongation.  Dose was adjusted.  Chronic pain syndrome.  Patient continues on medication regimen Ultracet and gabapentin.  These to be adjusted and managed with outpatient provider.  BPH without acute urinary symptoms.  Patient will continue his Flomax and Proscar.  Discharge Diagnoses:  Principal Problem:   Syncope and collapse Active Problems:   Essential hypertension   CKD (chronic kidney disease), stage III (HCC)   Anxiety state   PAF (paroxysmal atrial fibrillation) (HCC)   CAD in native artery   History of MI (myocardial infarction)   Sinus pause   Complete heart block [facility])    Discharge Instructions  Discharge Instructions    Call MD for:   Complete by:  As directed    Any acute change in medical condition   Diet - low sodium heart healthy   Complete by:  As directed    Discharge instructions   Complete by:  As directed    Post op care per cardiology   Increase activity slowly   Complete by:  As directed      Allergies as of 04/18/2018  Reactions   Naproxen Rash   Clopidogrel Bisulfate Other (See Comments)   H/O diverticulitis; prone to rectal bleeding.  Plavix complicated this.  jkl   Sulfonamide Derivatives Hives   Other Other (See  Comments)   Narcotics-C. Diff   Plavix [clopidogrel Bisulfate]    Latex Rash   Tape Itching, Rash   Latex-Adhesive tape      Medication List    TAKE these medications   AMBULATORY NON FORMULARY MEDICATION Place 2 mLs rectally 3 (three) times daily. Medication Name: 5 % lidocaine ointment mixed with 0.125 nitroglycerin ointment   AndroGel Pump 20.25 MG/ACT (1.62%) Gel Generic drug:  Testosterone Apply 1 application topically as needed (for low level).   aspirin EC 81 MG tablet Take 81 mg by mouth daily.   bisoprolol 5 MG tablet Commonly known as:  ZEBETA TAKE 1/2 TABLETS (2.5 MG TOTAL) BY MOUTH DAILY. What changed:  See the new instructions.   citalopram 20 MG tablet Commonly known as:  CELEXA Take 2 tablets (40 mg total) by mouth daily for 30 days. What changed:  medication strength   doxazosin 8 MG tablet Commonly known as:  CARDURA Take 4 mg by mouth at bedtime.   Flonase 50 MCG/ACT nasal spray Generic drug:  fluticasone Place 2 sprays into the nose daily.   furosemide 20 MG tablet Commonly known as:  LASIX Take 1 tablet (20 mg total) by mouth daily as needed. What changed:  when to take this   gabapentin 400 MG capsule Commonly known as:  NEURONTIN Take 400 mg by mouth 3 (three) times daily.   nitroGLYCERIN 0.4 MG SL tablet Commonly known as:  NITROSTAT PLACE 1 TABLET UNDER THE TONGUE EVERY 5 MINUTES AS NEEDED FOR CHEST PAIN FOR 3 DOSES What changed:  See the new instructions.   simvastatin 40 MG tablet Commonly known as:  ZOCOR TAKE 1 TABLET BY MOUTH AT BEDTIME What changed:  when to take this   tamsulosin 0.4 MG Caps capsule Commonly known as:  FLOMAX Take 0.4 mg by mouth daily after supper.   traMADol-acetaminophen 37.5-325 MG tablet Commonly known as:  ULTRACET Take 2 tablets by mouth 3 (three) times daily.      Follow-up Information    [facility].   Specialty:  Cardiology Why:  04/28/2018 @ 12:30PM, wound  check visit Contact information: [address] [place]       Deboraha Sprang, MD Follow up.   Specialty:  Cardiology Why:  07/28/2018 @ 2:30PM Contact information: [address] [place] [address] [phone]          Allergies  Allergen Reactions  . Naproxen Rash  . Clopidogrel Bisulfate Other (See Comments)    H/O diverticulitis; prone to rectal bleeding.  Plavix complicated this.  jkl  . Sulfonamide Derivatives Hives  . Other Other (See Comments)    Narcotics-C. Diff  . Plavix [Clopidogrel Bisulfate]   . Latex Rash  . Tape Itching and Rash    Latex-Adhesive tape    Consultations:  cardiology/EP   Procedures/Studies: Dg Chest 2 View  Result Date: 04/18/2018 CLINICAL DATA:  Pacer placement EXAM: CHEST - 2 VIEW COMPARISON:  11/04/2016 FINDINGS: Lateral view degraded by patient arm position. Mild right hemidiaphragm elevation. Dual lead pacer. Mild cardiomegaly. Atherosclerosis in the transverse aorta. No pleural effusion or pneumothorax. External pacer artifact projects over the left side of the chest. No congestive failure. Clear  lungs. IMPRESSION: No acute cardiopulmonary disease. Aortic Atherosclerosis (ICD10-I70.0). Electronically Signed   By: Abigail Miyamoto M.D.   On: 04/18/2018 08:12   Ct Head Wo Contrast  Result Date: 04/16/2018 CLINICAL DATA:  Syncopal episode, possible seizure EXAM: CT HEAD WITHOUT CONTRAST CT CERVICAL SPINE WITHOUT CONTRAST TECHNIQUE: Multidetector CT imaging of the head and cervical spine was performed following the standard protocol without intravenous contrast. Multiplanar CT image reconstructions of the cervical spine were also generated. COMPARISON:  CT 05/22/2011 FINDINGS: CT HEAD FINDINGS Brain: No acute territorial infarction, hemorrhage or intracranial mass. Mild small vessel ischemic changes of the white matter. Mild atrophy. Stable ventricle size. Vascular: No  hyperdense vessels.  Carotid vascular calcification Skull: Normal. Negative for fracture or focal lesion. Sinuses/Orbits: No acute finding. Other: None CT CERVICAL SPINE FINDINGS Alignment: Trace retrolisthesis C4 on C5. Facet alignment within normal limits. Skull base and vertebrae: No acute fracture. No primary bone lesion or focal pathologic process. Soft tissues and spinal canal: No prevertebral fluid or swelling. No visible canal hematoma. Disc levels: Moderate to marked degenerative change C4-C5 with mild degenerative change at C5-C6. Bilateral foraminal narrowing at C4-C5. Upper chest: Negative. Other: Partially calcified nodule in the left lung apex, no change IMPRESSION: 1. No CT evidence for acute intracranial abnormality. Mild atrophy and small vessel ischemic changes of the white matter 2. Degenerative changes of the cervical spine. No acute osseous abnormality Electronically Signed   By: Donavan Foil M.D.   On: 04/16/2018 01:36   Ct Cervical Spine Wo Contrast  Result Date: 04/16/2018 CLINICAL DATA:  Syncopal episode, possible seizure EXAM: CT HEAD WITHOUT CONTRAST CT CERVICAL SPINE WITHOUT CONTRAST TECHNIQUE: Multidetector CT imaging of the head and cervical spine was performed following the standard protocol without intravenous contrast. Multiplanar CT image reconstructions of the cervical spine were also generated. COMPARISON:  CT 05/22/2011 FINDINGS: CT HEAD FINDINGS Brain: No acute territorial infarction, hemorrhage or intracranial mass. Mild small vessel ischemic changes of the white matter. Mild atrophy. Stable ventricle size. Vascular: No hyperdense vessels.  Carotid vascular calcification Skull: Normal. Negative for fracture or focal lesion. Sinuses/Orbits: No acute finding. Other: None CT CERVICAL SPINE FINDINGS Alignment: Trace retrolisthesis C4 on C5. Facet alignment within normal limits. Skull base and vertebrae: No acute fracture. No primary bone lesion or focal pathologic process. Soft  tissues and spinal canal: No prevertebral fluid or swelling. No visible canal hematoma. Disc levels: Moderate to marked degenerative change C4-C5 with mild degenerative change at C5-C6. Bilateral foraminal narrowing at C4-C5. Upper chest: Negative. Other: Partially calcified nodule in the left lung apex, no change IMPRESSION: 1. No CT evidence for acute intracranial abnormality. Mild atrophy and small vessel ischemic changes of the white matter 2. Degenerative changes of the cervical spine. No acute osseous abnormality Electronically Signed   By: Donavan Foil M.D.   On: 04/16/2018 01:36       Subjective: Patient reports he feels significantly better.  No acute events overnight.  Reports stable felt ready to be discharged home today.  Discussed plan of care patient is in agreement with plan of care.  Discharge Exam: Vitals:   04/17/18 1713 04/18/18 0446  BP: (!) 145/63 (!) 165/80  Pulse: 83 84  Resp: 14 17  Temp: 97.6 F (36.4 C) 98.4 F (36.9 C)  SpO2: 98% 99%   Vitals:   04/17/18 1513 04/17/18 1529 04/17/18 1713 04/18/18 0446  BP: (!) 143/69 (!) 158/70 (!) 145/63 (!) 165/80  Pulse:   83 84  Resp:  14 17  Temp:   97.6 F (36.4 C) 98.4 F (36.9 C)  TempSrc:   Oral Oral  SpO2:   98% 99%  Weight:    93.4 kg  Height:        General: Pt is alert, awake, not in acute distress Cardiovascular: RRR, S1/S2 +, no rubs, no gallops Respiratory: CTA bilaterally, no wheezing, no rhonchi Abdominal: Soft, NT, ND, bowel sounds + Extremities: no edema, no cyanosis    The results of significant diagnostics from this hospitalization (including imaging, microbiology, ancillary and laboratory) are listed below for reference.     Microbiology: Recent Results (from the past 240 hour(s))  Surgical PCR screen     Status: None   Collection Time: 04/17/18  7:45 AM  Result Value Ref Range Status   MRSA, PCR NEGATIVE NEGATIVE Final   Staphylococcus aureus NEGATIVE NEGATIVE Final    Comment:  (NOTE) The Xpert SA Assay (FDA approved for NASAL specimens in patients 35 years of age and older), is one component of a comprehensive surveillance program. It is not intended to diagnose infection nor to guide or monitor treatment. Performed at [facility], [place] [address], [place], [place] [address]      Labs: BNP (last 3 results) No results for input(s): BNP in the last 8760 hours. Basic Metabolic Panel: Recent Labs  Lab 04/16/18 0034 04/16/18 0539 04/17/18 0356  NA 137 136 138  K 4.9 4.8 5.5*  CL 102 103 106  CO2 21* 22 22  GLUCOSE 205* 180* 161*  BUN 22 23 36*  CREATININE 1.80* 1.76* 1.93*  CALCIUM 9.2 9.1 9.0   Liver Function Tests: Recent Labs  Lab 04/16/18 0034 04/16/18 0539  AST 29 24  ALT 18 15  ALKPHOS 32* 30*  BILITOT 0.6 0.4  PROT 6.9 6.2*  ALBUMIN 4.0 3.6   No results for input(s): LIPASE, AMYLASE in the last 168 hours. No results for input(s): AMMONIA in the last 168 hours. CBC: Recent Labs  Lab 04/16/18 0034 04/17/18 0356  WBC 5.5 13.3*  NEUTROABS 4.9  --   HGB 13.9 13.9  HCT 43.5 40.9  MCV 91.8 90.1  PLT 154 164   Cardiac Enzymes: Recent Labs  Lab 04/16/18 0034 04/16/18 0539  CKTOTAL 375 349  TROPONINI <0.03 <0.03   BNP: Invalid input(s): POCBNP CBG: Recent Labs  Lab 04/16/18 0049  GLUCAP 177*   D-Dimer No results for input(s): DDIMER in the last 72 hours. Hgb A1c No results for input(s): HGBA1C in the last 72 hours. Lipid Profile No results for input(s): CHOL, HDL, LDLCALC, TRIG, CHOLHDL, LDLDIRECT in the last 72 hours. Thyroid function studies Recent Labs    04/16/18 1359  TSH 0.899   Anemia work up No results for input(s): VITAMINB12, FOLATE, FERRITIN, TIBC, IRON, RETICCTPCT in the last 72 hours. Urinalysis    Component Value Date/Time   COLORURINE YELLOW 04/16/2018 0428   APPEARANCEUR CLEAR 04/16/2018 0428   LABSPEC 1.016 04/16/2018 0428   PHURINE 8.0 04/16/2018 0428   GLUCOSEU 50 (A) 04/16/2018  0428   HGBUR NEGATIVE 04/16/2018 0428   BILIRUBINUR NEGATIVE 04/16/2018 0428   KETONESUR NEGATIVE 04/16/2018 0428   PROTEINUR 30 (A) 04/16/2018 0428   UROBILINOGEN 0.2 08/13/2013 1422   NITRITE NEGATIVE 04/16/2018 0428   LEUKOCYTESUR NEGATIVE 04/16/2018 0428   Sepsis Labs Invalid input(s): PROCALCITONIN,  WBC,  LACTICIDVEN Microbiology Recent Results (from the past 240 hour(s))  Surgical PCR screen     Status: None   Collection  Time: 04/17/18  7:45 AM  Result Value Ref Range Status   MRSA, PCR NEGATIVE NEGATIVE Final   Staphylococcus aureus NEGATIVE NEGATIVE Final    Comment: (NOTE) The Xpert SA Assay (FDA approved for NASAL specimens in patients 87 years of age and older), is one component of a comprehensive surveillance program. It is not intended to diagnose infection nor to guide or monitor treatment. Performed at [facility], [place] [address], [place], [place] [address]      Time coordinating discharge: Over 30 minutes  SIGNED:   Nicolette Bang, MD  [facility] 04/18/2018, 10:42 AM Pager   If 7PM-7AM, please contact night-coverage [email or web address] Password [email or web address]

## 2018-04-18 NOTE — Progress Notes (Signed)
Doing well s/p PPM implant by Dr Caryl Comes yesterday.  CXR reveals stable leads, no ptx.  He is without symptoms of chest pain or SOB at this time.  Device interrogation is personally reviewed and normal.  OK to discharge to home No driving until cleared by Dr Caryl Comes Resume bisoprolol and previous home medicines.  Will need wound check in 10 days and follow-up with Dr Caryl Comes in 3 months (my office will arrange).  Thompson Grayer MD, [facility] 04/18/2018 9:37 AM

## 2018-04-20 ENCOUNTER — Encounter (HOSPITAL_COMMUNITY): Payer: Self-pay | Admitting: Internal Medicine

## 2018-04-28 ENCOUNTER — Ambulatory Visit (INDEPENDENT_AMBULATORY_CARE_PROVIDER_SITE_OTHER): Payer: Medicare Other | Admitting: *Deleted

## 2018-04-28 ENCOUNTER — Other Ambulatory Visit: Payer: Self-pay

## 2018-04-28 ENCOUNTER — Telehealth: Payer: Self-pay | Admitting: *Deleted

## 2018-04-28 DIAGNOSIS — I455 Other specified heart block: Secondary | ICD-10-CM | POA: Diagnosis not present

## 2018-04-28 DIAGNOSIS — Z950 Presence of cardiac pacemaker: Secondary | ICD-10-CM | POA: Diagnosis not present

## 2018-04-28 DIAGNOSIS — I442 Atrioventricular block, complete: Secondary | ICD-10-CM

## 2018-04-28 LAB — CUP PACEART INCLINIC DEVICE CHECK
Brady Statistic RA Percent Paced: 56.3 %
Date Time Interrogation Session: 20200317132430
Implantable Lead Implant Date: 20200306
Implantable Lead Location: 753859
Implantable Lead Location: 753860
Implantable Lead Model: 5076
Implantable Lead Model: 5076
Implantable Pulse Generator Implant Date: 20200306
Lead Channel Impedance Value: 437 Ohm
Lead Channel Impedance Value: 513 Ohm
Lead Channel Pacing Threshold Amplitude: 0.75 V
Lead Channel Pacing Threshold Pulse Width: 0.4 ms
Lead Channel Pacing Threshold Pulse Width: 0.4 ms
Lead Channel Sensing Intrinsic Amplitude: 1.1 mV
Lead Channel Sensing Intrinsic Amplitude: 12.9 mV
Lead Channel Setting Pacing Amplitude: 3.5 V
Lead Channel Setting Pacing Pulse Width: 0.4 ms
Lead Channel Setting Sensing Sensitivity: 0.6 mV
MDC IDC LEAD IMPLANT DT: 20200306
MDC IDC MSMT LEADCHNL RA PACING THRESHOLD AMPLITUDE: 0.75 V
MDC IDC SET LEADCHNL RV PACING AMPLITUDE: 3.5 V
MDC IDC STAT BRADY RV PERCENT PACED: 49 %

## 2018-04-28 NOTE — Telephone Encounter (Signed)
Patient wife calling back w/ questions.

## 2018-04-28 NOTE — Progress Notes (Signed)
Device Clinic PPM wound check appointment. Dermabond removed. Wound without redness. Hematoma noted, soft to palpation, improved since hospital d/c per patient's wife, +ASA 81mg . Incision edges fully approximated and healing well. Patient and wife agree to call the DC if any signs/symptoms of infection or bleeding are noted. Plan to also clarify driving restrictions with SK.  Normal device function. Thresholds, sensing, and impedances consistent with implant measurements. Device programmed at 3.5V with auto capture programmed on for extra safety margin until 3 month visit. Changes per protocol: RA/RV high threshold alerts turned on, AT/AF daily burden alert turned on, VHR detection increased to 171bpm. Patient conducting 1:1 today (Vp 49.0% since implant, suspect some fusion), extended PAV to 216ms and maintained SAV at 239ms as A-R interval measures 219ms and P-R interval measures 230ms. Histogram distribution appropriate for patient and level of activity. No mode switches. 1 high ventricular rate noted--NSVT. Patient educated about wound care, arm mobility, lifting restrictions, and Carelink monitor. ROV with SK on 07/28/18.

## 2018-04-28 NOTE — Telephone Encounter (Signed)
Spoke with patient to advise that as long as his device site hematoma continues to improve, he should continue on his once daily ASA 81mg  per Dr. Caryl Comes. Advised to call if any signs/symptoms of bleeding or infection. Also advised that Dr. Caryl Comes said it was safe to resume driving from a cardiac perspective (if no other providers have restricted his driving). Encouraged patient to discuss with his family as well. He verbalizes understanding.  Patient's wife called back reporting that patient answered the phone and couldn't remember what I said. She also reports he is HOH. Advised of Dr. Olin Pia recommendations. Patient's wife verbalizes understanding. Encouraged her to contact patient's PCP if she has additional concerns regarding patient's driving. She verbalizes understanding and thanked me for my call.

## 2018-05-04 LAB — ECHOCARDIOGRAM COMPLETE
Height: 67 in
Weight: 3360 oz

## 2018-05-05 ENCOUNTER — Telehealth: Payer: Self-pay | Admitting: Internal Medicine

## 2018-05-05 NOTE — Telephone Encounter (Signed)
Spoke with patient and wife. Wound site healing well, hematoma resolved per wife. Pt reports increased fatigue over the past two days, has needed to take naps. He also notes intermittent dizziness, sometimes associated with position changes, but other times associated with a sensation of a hot flush and tingling all over his body for a few minutes at a time. He also notes some dizziness when he first lays down in bed for the past 2-3 days. Pt reports these are similar sensations to those that he had right before his syncopal episodes (prior to getting PPM). No syncope since PPM implanted.  BP 126/66 yesterday--lowest recent home BP. Takes bisoprolol in the afternoon and doxazosin QHS.  Only recent med change is citalopram--now takes 20mg  in AM and 20mg  in PM (dosing changed per hospital d/c summary). Citalopram makes his mouth dry, so he sips water throughout the day. May not have had enough fluids the past couple of days. Hasn't taken PRN furosemide recently (past week or so). Encouraged pt to ensure he is drinking 6-8 8oz glasses of decaffeinated liquids daily.  Pt was conducting 1:1 at wound check, Vp 49% but suspected some fusion. AV delays were extended to PAV 273ms and SAV at 217ms at that visit.   Assisted with sending manual PPM transmission for review. Presenting rhythm shows Ap/Vs, Vp at 60bpm. Normal device function, lead trends stable. No atrial or ventricular arrhythmia episodes since wound check on 04/28/18. Histograms show increased A-pacing at lower rate (rate response not currently enabled), V-pacing decreased to 12.7% since reprogramming.  Advised pt of findings and explained I will route this message to Dr. Caryl Comes and Rolanda Lundborg, RN, for review and recommendations. ED precautions given for acutely worsening cardiac symptoms overnight. Pt and wife verbalize understanding of plan and thanked me for my call.

## 2018-05-05 NOTE — Telephone Encounter (Signed)
Patient had pacemaker inserted on 3/5 he has some questions and concerns and would like to speak to there nurse.

## 2018-05-06 NOTE — Telephone Encounter (Signed)
With bradycardia would stop bisoprolol  No data taht betablockers are useful after there first year after MI if EF is good and it is  Lets do that and see how he does with perhaps another transmission in about 4 weeks  Will to Telehealth visit at that time

## 2018-05-06 NOTE — Telephone Encounter (Signed)
Pt and his wife are aware of Dr Olin Pia recommendations. He will discontinue bisoprolol. They understand we will follow up with him in 2 weeks to arrange a telehealth visit and remote check. They have verbalized understanding and had no additional questions.

## 2018-05-07 NOTE — Telephone Encounter (Signed)
Automatic remote transmission scheduled for 06/03/18 to review pacing % and histogram data after stopping bisoprolol.

## 2018-05-11 ENCOUNTER — Telehealth: Payer: Self-pay | Admitting: Internal Medicine

## 2018-05-11 NOTE — Telephone Encounter (Signed)
° ° °  Patient calling to report dizziness, weakness. Patient states he medication was adjusted now he feels worse    1) Are you dizzy now? NO  2) Do you feel faint or have you passed out? NO  3) Do you have any other symptoms? weakness  4) Have you checked your HR and BP (record if available)? 136/66

## 2018-05-11 NOTE — Telephone Encounter (Signed)
Spoke with pt and his wife. Last week, Dr Caryl Comes discontinued his bisoprolol in hopes it would help his fatigue. Pt states he still feels poor and has no energy. His PCP recently increased his dosage of Celexa and correlates with his increased fatigue. I have advised him to discuss with his PCP as there may need to be a dosage change. Pt's wife verbalized understanding and had no additional questions.

## 2018-05-13 ENCOUNTER — Telehealth: Payer: Self-pay | Admitting: Internal Medicine

## 2018-05-13 NOTE — Telephone Encounter (Signed)
Patient's wife Mardene Celeste is called stating his BP has been running a little high 151/95 this morning, at lunch 148/90, 176/89 at 4:50pm.  She wants to know if he should go back on the bisoprolol.  He was taken off of it by Dr. Caryl Comes after his surgery, but know his BP is running high.

## 2018-05-14 MED ORDER — AMLODIPINE BESYLATE 5 MG PO TABS: 5.0000 mg | ORAL_TABLET | Freq: Every day | ORAL | 3 refills | Status: DC

## 2018-05-14 NOTE — Telephone Encounter (Signed)
Called patient, spoke with wife regarding recommendations.   Patient verbalized understanding, medication was sent.   Patient has appointment scheduled 04/27- they will monitor and keep that appointment.

## 2018-05-14 NOTE — Telephone Encounter (Signed)
Spoke with wife she states since 03/24- they were told by Dr.Klein to stop his bisoprolol. Since doing so they have noticed an increase in BP.   I advised the reason they stopped that medication was due to the low heart rate. Patient gave BP's from yesterday with HR readings.   Yesterday morning- 151/95 HR 80 Yesterday afternoon- 148/90 HR 79 Yesterday evening- 176/89 HR 84  Patient would like to know if they should restart the medication or go onto a different one.   Will route to Pharmacy, and Dr.Hilty/nurse.

## 2018-05-14 NOTE — Telephone Encounter (Signed)
Thanks . I agree completely with amlodipine.  Dr. Lemmie Evens

## 2018-05-14 NOTE — Telephone Encounter (Signed)
Will recommend against any beta-blocker de to recent hx of bradycardia and hospital admission due to syncope.  Noted not a good candidate for full dose ACEi or ARB with baseline Scr ~ 2  Recommendation:  1. Start amlodipine 5mg  daily  2. Monitor BP and HR daily and keep records  3. Follow up appointment with Dr Debara Pickett once available.  He is overdue for 6 months follow up

## 2018-05-22 ENCOUNTER — Telehealth: Payer: Self-pay | Admitting: Internal Medicine

## 2018-05-22 NOTE — Telephone Encounter (Signed)
New Message    Pt is calling because he said he is having a weird feeling. He has tingling and he said he feels warm inside, no fever. He also says his breathing is more difficult during this episode he has. The sensation comes and goes, more at night    Please call

## 2018-05-22 NOTE — Telephone Encounter (Signed)
Called pt. No answer left msg to call back.  

## 2018-05-26 NOTE — Telephone Encounter (Signed)
Hard to know what his symptoms are due to - may not be anything significant. Agree with PCP follow-up.  Dr. Lemmie Evens

## 2018-05-26 NOTE — Telephone Encounter (Signed)
Spoke with pt and has noted 2-3 episodes of feeling hot ,"swimmy headed "and arms and feet tingling lasts a couple of minutes Per pt B/P and temp are normal Pt says had a good night last night Last BMEt K was 5.5 forwarded copy to Dr Nyoka Cowden as pt was going to call PMD office today Will forward to Dr Debara Pickett for review and recommendations ./cy

## 2018-05-26 NOTE — Telephone Encounter (Signed)
Pt aware of recommendations and agrees with plan ./cy 

## 2018-05-29 ENCOUNTER — Telehealth: Payer: Self-pay | Admitting: *Deleted

## 2018-05-29 ENCOUNTER — Encounter: Payer: Self-pay | Admitting: *Deleted

## 2018-05-29 NOTE — Telephone Encounter (Signed)
   Cardiac Questionnaire:    Since your last visit or hospitalization:    1. Have you been having new or worsening chest pain? NO   2. Have you been having new or worsening shortness of breath? NO 3. Have you been having new or worsening leg swelling, wt gain, or increase in abdominal girth (pants fitting more tightly)? NO   4. Have you had any passing out spells? NO    *A YES to any of these questions would result in the appointment being kept. *If all the answers to these questions are NO, we should indicate that given the current situation regarding the worldwide coronarvirus pandemic, at the recommendation of the CDC, we are looking to limit gatherings in our waiting area, and thus will reschedule their appointment beyond four weeks from today.   _____________   COVID-19 Pre-Screening Questions:  . Do you currently have a fever? NO . Have you recently travelled on a cruise, internationally, or to [place], [place], [place], [place], [place], or [place], [place] ([employer])? NO . Have you been in contact with someone that is currently pending confirmation of Covid19 testing or has been confirmed to have the Covid19 virus?  NO Are you currently experiencing fatigue or cough?NO          

## 2018-06-03 ENCOUNTER — Other Ambulatory Visit: Payer: Self-pay

## 2018-06-03 ENCOUNTER — Ambulatory Visit (INDEPENDENT_AMBULATORY_CARE_PROVIDER_SITE_OTHER): Payer: Medicare Other | Admitting: *Deleted

## 2018-06-03 DIAGNOSIS — I480 Paroxysmal atrial fibrillation: Secondary | ICD-10-CM

## 2018-06-03 DIAGNOSIS — I442 Atrioventricular block, complete: Secondary | ICD-10-CM

## 2018-06-03 LAB — CUP PACEART REMOTE DEVICE CHECK
Battery Remaining Longevity: 161 mo
Battery Voltage: 3.22 V
Brady Statistic AP VP Percent: 2.24 %
Brady Statistic AP VS Percent: 18.14 %
Brady Statistic AS VP Percent: 0.96 %
Brady Statistic AS VS Percent: 78.65 %
Brady Statistic RA Percent Paced: 20.47 %
Brady Statistic RV Percent Paced: 3.2 %
Date Time Interrogation Session: 20200422021841
Implantable Lead Implant Date: 20200306
Implantable Lead Implant Date: 20200306
Implantable Lead Location: 753859
Implantable Lead Location: 753860
Implantable Lead Model: 5076
Implantable Lead Model: 5076
Implantable Pulse Generator Implant Date: 20200306
Lead Channel Impedance Value: 323 Ohm
Lead Channel Impedance Value: 399 Ohm
Lead Channel Impedance Value: 418 Ohm
Lead Channel Impedance Value: 513 Ohm
Lead Channel Pacing Threshold Amplitude: 0.5 V
Lead Channel Pacing Threshold Amplitude: 0.625 V
Lead Channel Pacing Threshold Pulse Width: 0.4 ms
Lead Channel Pacing Threshold Pulse Width: 0.4 ms
Lead Channel Sensing Intrinsic Amplitude: 1.25 mV
Lead Channel Sensing Intrinsic Amplitude: 1.25 mV
Lead Channel Sensing Intrinsic Amplitude: 12.375 mV
Lead Channel Sensing Intrinsic Amplitude: 12.375 mV
Lead Channel Setting Pacing Amplitude: 3.5 V
Lead Channel Setting Pacing Amplitude: 3.5 V
Lead Channel Setting Pacing Pulse Width: 0.4 ms
Lead Channel Setting Sensing Sensitivity: 0.6 mV

## 2018-06-04 ENCOUNTER — Other Ambulatory Visit: Payer: Self-pay

## 2018-06-04 ENCOUNTER — Telehealth (INDEPENDENT_AMBULATORY_CARE_PROVIDER_SITE_OTHER): Payer: Medicare Other | Admitting: Internal Medicine

## 2018-06-04 VITALS — BP 142/76 | HR 74 | Ht 65.0 in | Wt 200.0 lb

## 2018-06-04 DIAGNOSIS — I455 Other specified heart block: Secondary | ICD-10-CM

## 2018-06-04 DIAGNOSIS — R0683 Snoring: Secondary | ICD-10-CM

## 2018-06-04 DIAGNOSIS — I442 Atrioventricular block, complete: Secondary | ICD-10-CM

## 2018-06-04 DIAGNOSIS — I480 Paroxysmal atrial fibrillation: Secondary | ICD-10-CM

## 2018-06-04 DIAGNOSIS — Z950 Presence of cardiac pacemaker: Secondary | ICD-10-CM

## 2018-06-04 NOTE — Progress Notes (Signed)
Electrophysiology TeleHealth Note   Due to national recommendations of social distancing due to COVID 19, an audio/video telehealth visit is felt to be most appropriate for this patient at this time.  See MyChart message from today for the patient's consent to telehealth for [facility].   Date:  06/04/2018   ID:  Gary [name], DOB [birth year], MRN [id number]  Location: patient's home  Provider location: [address] [place]  Evaluation Performed: Follow-up visit  PCP:  Levin Erp, MD  Cardiologist:   [facility] Electrophysiologist:  SK   Chief Complaint:  Nocturnal spell,palpitations   History of Present Illness:    Gary [name] is a 85 y.o. male who presents via audio/video conferencing for a telehealth visit today.  Since last being seen for pacemaker insertion for complete heart block, the patient reports no syncope or presyncope  Recurrent spells of LH with warmth, can begin while lying flat in bed, duration 10 min; residual fatigue, lethargic   Occurring at night, never during the day  Mostly in the middle of the night, does not occur with day time naps  No chest pain or sob--modest edema on amlodipine   Date Cr K Hgb  3*/20 1.76 4.8 13.9  3/20 1.93 5.5 13.9     The patient denies symptoms of fevers, chills, cough, or new SOB worrisome for COVID 19.    Past Medical History:  Diagnosis Date  . Adenomatous colon polyp    2010   . Anemia   . Anxiety   . Arthritis    "hips and lower back"  . Arthritis pain   . Blood transfusion   . Chest pain 03/29/11   2D Echo without contrast on 03/29/11 - EF= 55-60%.  . Cholecystitis   . Chronic back pain greater than 3 months duration   . Chronic kidney disease   . Colon polyps   . Complication of anesthesia    once slow to wake up  . Coronary angioplasty status 2011  . Coronary artery disease   . Diarrhea   . Diverticulosis   . Dyslipidemia   . GERD (gastroesophageal reflux disease)   . GI bleeding  after 07/2009   "triggered by Plavix & ASA; from my diverticulosis"  . H/O Clostridium difficile infection   . H/O myocardial perfusion scan 04/04/11   For Pre-noncardiac surgery - EF = 67%, no ischemia, and is considered low risk.  Marland Kitchen Heart attack ([place]) 07/2009   nonSTEMI with an occluded circumflex artery and collaterals.  Marland Kitchen Heart murmur   . Hemorrhoids   . History of shingles   . Hyperlipidemia   . Hypertension   . Kidney stones   . Lower extremity edema    Taking furosemide and it seems to be helping.  . Neuromuscular disorder ([place])   . Nonspecific chest pain 08/26/2012   Went to ED 08/26/12  . Stomach problems   . Syncope and collapse 04/16/2018    Past Surgical History:  Procedure Laterality Date  . BACK SURGERY  1997  . CARDIAC CATHETERIZATION  08/09/2009   Circumflex 100% occluded with right-to-left collaterals, mild irregularities in the proximal and distal LAD and diagonal system, normal LV systolic function, and minor infrarenal irregularities, but no abdominal aortic aneurysm.  . CHOLECYSTECTOMY  04/12/2011   Procedure: CHOLECYSTECTOMY;  Surgeon: Adin Hector, MD;  Location: [facility];  Service: General;  Laterality: N/A;  . CHOLECYSTECTOMY  04/12/2011   Procedure: LAPAROSCOPIC CHOLECYSTECTOMY;  Surgeon: Renelda Loma  Alyssa Grove, MD;  Location: [place];  Service: General;  Laterality: N/A;  converted to open  . COLON SURGERY     COLONOSCOPY  . ESOPHAGOGASTRODUODENOSCOPY N/A 08/05/2013   Procedure: ESOPHAGOGASTRODUODENOSCOPY (EGD);  Surgeon: Jerene Bears, MD;  Location: [facility];  Service: Gastroenterology;  Laterality: N/A;  . EYE SURGERY  1942   "right eye was crossed; they  corrected it"  . LUMBAR FUSION  07/22/2012  . PACEMAKER IMPLANT N/A 04/17/2018   Procedure: PACEMAKER IMPLANT;  Surgeon: Deboraha Sprang, MD;  Location: [place] [facility];  Service: Cardiovascular;  Laterality: N/A;  . POSTERIOR FUSION LUMBAR SPINE  08/1995   "bottom 4 vertebra"  . SUBTOTAL COLECTOMY  01/24/2010   . TOTAL HIP ARTHROPLASTY Right 08/03/2013   Procedure: TOTAL HIP ARTHROPLASTY ANTERIOR APPROACH;  Surgeon: Hessie Dibble, MD;  Location: [facility];  Service: Orthopedics;  Laterality: Right;    Current Outpatient Medications  Medication Sig Dispense Refill  . AMBULATORY NON FORMULARY MEDICATION Place 2 mLs rectally 3 (three) times daily. Medication Name: 5 % lidocaine ointment mixed with 0.125 nitroglycerin ointment 30 g 0  . amLODipine (NORVASC) 5 MG tablet Take 1 tablet (5 mg total) by mouth daily. 180 tablet 3  . ANDROGEL PUMP 20.25 MG/ACT (1.62%) GEL Apply 1 application topically as needed (for low level).     Marland Kitchen aspirin EC 81 MG tablet Take 81 mg by mouth daily.    . citalopram (CELEXA) 20 MG tablet Take 2 tablets (40 mg total) by mouth daily for 30 days. 30 tablet 0  . doxazosin (CARDURA) 8 MG tablet Take 4 mg by mouth at bedtime.     . fluticasone (FLONASE) 50 MCG/ACT nasal spray Place 2 sprays into the nose daily.     . furosemide (LASIX) 20 MG tablet Take 1 tablet (20 mg total) by mouth daily as needed. (Patient taking differently: Take 20 mg by mouth every Monday, Wednesday, and Friday. ) 90 tablet 1  . gabapentin (NEURONTIN) 400 MG capsule Take 400 mg by mouth 3 (three) times daily.     . nitroGLYCERIN (NITROSTAT) 0.4 MG SL tablet PLACE 1 TABLET UNDER THE TONGUE EVERY 5 MINUTES AS NEEDED FOR CHEST PAIN FOR 3 DOSES (Patient taking differently: Place 0.4 mg under the tongue every 5 (five) minutes as needed for chest pain. ) 25 tablet 0  . simvastatin (ZOCOR) 40 MG tablet TAKE 1 TABLET BY MOUTH AT BEDTIME (Patient taking differently: Take 40 mg by mouth daily at 6 PM. ) 90 tablet 2  . Tamsulosin HCl (FLOMAX) 0.4 MG CAPS Take 0.4 mg by mouth daily after supper.    . traMADol-acetaminophen (ULTRACET) 37.5-325 MG per tablet Take 2 tablets by mouth 3 (three) times daily.      No current facility-administered medications for this visit.     Allergies:   Naproxen; Clopidogrel bisulfate;  Sulfonamide derivatives; Other; Plavix [clopidogrel bisulfate]; Latex; and Tape   Social History:  The patient  reports that he has never smoked. He has never used smokeless tobacco. He reports that he does not drink alcohol or use drugs.   Family History:  The patient's   family history includes Diverticulosis in his sister; Heart disease in his father; Pancreatic cancer in his mother; Stroke in his father.   ROS:  Please see the history of present illness.   All other systems are personally reviewed and negative.    Exam:    Vital Signs:  BP (!) 142/76   Pulse  74   Ht 5\' 5"  (1.651 m)   Wt 200 lb (90.7 kg)   BMI 33.28 kg/m     Well appearing, alert and conversant, regular work of breathing,  good skin color Eyes- anicteric, neuro- grossly intact, skin- no apparent rash or lesions or cyanosis, mouth- oral mucosa is pink Device pocket well healed; without hematoma or erythema.  There is no tethering   Labs/Other Tests and Data Reviewed:    Recent Labs: 04/16/2018: ALT 15; TSH 0.899 04/17/2018: BUN 36; Creatinine, Ser 1.93; Hemoglobin 13.9; Platelets 164; Potassium 5.5; Sodium 138   Wt Readings from Last 3 Encounters:  06/04/18 200 lb (90.7 kg)  04/18/18 206 lb (93.4 kg)  01/16/18 213 lb (96.6 kg)     Other studies personally reviewed: Additional studies/ records that were reviewed today include: labs   Review of the above records today demonstrates:   Prior radiographs:     *  Last device remote is reviewed from [facility] PDF dated 4/20  which reveals normal device function,   arrhythmias - none     ASSESSMENT & PLAN:    Complete Heart Block  Syncope  Nocturnal spells   Pacemaker Medtronic DOI 3/20  CAD  Edema  Hypertension  Hyperkalemia    No recurrent syncope.  Blood pressure remains elevated.  He has developed peripheral edema with his amlodipine.  We will discontinue it.  We will need to check blood pressures next week.  He was hyperkalemic at hospital  discharge.  We will need to check a potassium level.  His nocturnal spells seem to be occurring in the context of deep sleep.  He does snore.  I wonder if this is a manifestation of sleep apnea.  We will attempt to arrange for home sleep study.  Without symptoms of ischemia        COVID 19 screen The patient denies symptoms of COVID 19 at this time.  The importance of social distancing was discussed today.  Follow-up:  As Scheduled  Next remote: As Scheduled   Current medicines are reviewed at length with the patient today.   The patient has concerns regarding his medicines.  The following changes were made today:   Stop amlodipine  Labs/ tests ordered today include: BMET; home sleep study No orders of the defined types were placed in this encounter.   Future tests ( post COVID )     Patient Risk:  after full review of this patients clinical status, I feel that they are at moderate risk at this time.  Today, I have spent18 minutes with the patient with telehealth technology discussing the above.  Signed, Virl Axe, MD  06/04/2018 2:05 PM     [facility] [address] [place] [address] [phone] (office) [phone] (fax)

## 2018-06-05 ENCOUNTER — Other Ambulatory Visit: Payer: Self-pay

## 2018-06-05 ENCOUNTER — Other Ambulatory Visit: Payer: Medicare Other | Admitting: *Deleted

## 2018-06-05 DIAGNOSIS — I442 Atrioventricular block, complete: Secondary | ICD-10-CM

## 2018-06-05 DIAGNOSIS — I480 Paroxysmal atrial fibrillation: Secondary | ICD-10-CM

## 2018-06-05 DIAGNOSIS — R0683 Snoring: Secondary | ICD-10-CM

## 2018-06-05 DIAGNOSIS — Z950 Presence of cardiac pacemaker: Secondary | ICD-10-CM

## 2018-06-05 DIAGNOSIS — I455 Other specified heart block: Secondary | ICD-10-CM

## 2018-06-05 LAB — BASIC METABOLIC PANEL
BUN/Creatinine Ratio: 11 (ref 10–24)
BUN: 17 mg/dL (ref 8–27)
CO2: 24 mmol/L (ref 20–29)
Calcium: 9.2 mg/dL (ref 8.6–10.2)
Chloride: 97 mmol/L (ref 96–106)
Creatinine, Ser: 1.56 mg/dL — ABNORMAL HIGH (ref 0.76–1.27)
GFR calc Af Amer: 47 mL/min/{1.73_m2} — ABNORMAL LOW (ref >59)
GFR calc non Af Amer: 40 mL/min/{1.73_m2} — ABNORMAL LOW (ref >59)
Glucose: 206 mg/dL — ABNORMAL HIGH (ref 65–99)
Potassium: 3.9 mmol/L (ref 3.5–5.2)
Sodium: 139 mmol/L (ref 134–144)

## 2018-06-08 ENCOUNTER — Ambulatory Visit: Payer: Medicare Other | Admitting: Internal Medicine

## 2018-06-11 NOTE — Progress Notes (Signed)
Remote pacemaker transmission.   

## 2018-06-22 ENCOUNTER — Ambulatory Visit (INDEPENDENT_AMBULATORY_CARE_PROVIDER_SITE_OTHER): Payer: Medicare Other

## 2018-06-22 DIAGNOSIS — G4733 Obstructive sleep apnea (adult) (pediatric): Secondary | ICD-10-CM

## 2018-06-22 DIAGNOSIS — Z950 Presence of cardiac pacemaker: Secondary | ICD-10-CM

## 2018-06-22 DIAGNOSIS — I480 Paroxysmal atrial fibrillation: Secondary | ICD-10-CM

## 2018-06-22 DIAGNOSIS — I455 Other specified heart block: Secondary | ICD-10-CM

## 2018-06-22 DIAGNOSIS — I442 Atrioventricular block, complete: Secondary | ICD-10-CM

## 2018-06-22 DIAGNOSIS — R0683 Snoring: Secondary | ICD-10-CM

## 2018-07-01 NOTE — Telephone Encounter (Signed)
This encounter was created in error - please disregard.

## 2018-07-03 ENCOUNTER — Ambulatory Visit (INDEPENDENT_AMBULATORY_CARE_PROVIDER_SITE_OTHER): Payer: Medicare Other | Admitting: *Deleted

## 2018-07-03 DIAGNOSIS — I442 Atrioventricular block, complete: Secondary | ICD-10-CM

## 2018-07-04 LAB — CUP PACEART REMOTE DEVICE CHECK
Battery Remaining Longevity: 165 mo
Battery Voltage: 3.21 V
Brady Statistic AP VP Percent: 0.53 %
Brady Statistic AP VS Percent: 23.72 %
Brady Statistic AS VP Percent: 0.13 %
Brady Statistic AS VS Percent: 75.62 %
Brady Statistic RA Percent Paced: 24.39 %
Brady Statistic RV Percent Paced: 0.67 %
Date Time Interrogation Session: 20200522181656
Implantable Lead Implant Date: 20200306
Implantable Lead Implant Date: 20200306
Implantable Lead Location: 753859
Implantable Lead Location: 753860
Implantable Lead Model: 5076
Implantable Lead Model: 5076
Implantable Pulse Generator Implant Date: 20200306
Lead Channel Impedance Value: 323 Ohm
Lead Channel Impedance Value: 361 Ohm
Lead Channel Impedance Value: 475 Ohm
Lead Channel Impedance Value: 475 Ohm
Lead Channel Pacing Threshold Amplitude: 0.5 V
Lead Channel Pacing Threshold Amplitude: 0.625 V
Lead Channel Pacing Threshold Pulse Width: 0.4 ms
Lead Channel Pacing Threshold Pulse Width: 0.4 ms
Lead Channel Sensing Intrinsic Amplitude: 1.25 mV
Lead Channel Sensing Intrinsic Amplitude: 1.25 mV
Lead Channel Sensing Intrinsic Amplitude: 9.375 mV
Lead Channel Sensing Intrinsic Amplitude: 9.375 mV
Lead Channel Setting Pacing Amplitude: 3.5 V
Lead Channel Setting Pacing Amplitude: 3.5 V
Lead Channel Setting Pacing Pulse Width: 0.4 ms
Lead Channel Setting Sensing Sensitivity: 0.6 mV

## 2018-07-09 ENCOUNTER — Telehealth: Payer: Self-pay | Admitting: *Deleted

## 2018-07-09 DIAGNOSIS — G4733 Obstructive sleep apnea (adult) (pediatric): Secondary | ICD-10-CM

## 2018-07-09 NOTE — Telephone Encounter (Addendum)
Order placed to [facility]. Informed patient of sleep study results and patient understanding was verbalized. DME selection is [facility]. Patient understands he will be contacted by [facility] to set up his cpap. Patient understands to call if [facility] does not contact him with new setup in a timely manner. Patient understands they will be called once confirmation has been received from [facility] that they have received their new machine to schedule 12 week follow up appointment.  [facility] notified of new cpap order  Please add to airview Patient was grateful for the call and thanked me.

## 2018-07-09 NOTE — Telephone Encounter (Signed)
-----   Message from Sarina Ill, RN sent at 07/03/2018  9:07 AM EDT -----  ----- Message ----- From: Sueanne Margarita, MD Sent: 07/01/2018   9:11 PM EDT To: Sarina Ill, RN  Please let patient know that they have sleep apnea and recommend CPAP titration. Please order a Resmed CPAP on auto PAP from 5 to 18cm H2O with heated humidity and mask of choice through [facility] and followup with me in 12 weeks

## 2018-07-10 ENCOUNTER — Other Ambulatory Visit: Payer: Self-pay

## 2018-07-14 ENCOUNTER — Encounter: Payer: Self-pay | Admitting: Cardiology

## 2018-07-14 NOTE — Addendum Note (Signed)
Addended by: Tiajuana Amass on: 07/14/2018 12:08 PM   Modules accepted: Level of Service

## 2018-07-14 NOTE — Telephone Encounter (Signed)
Confirmation of referral received from [facility].

## 2018-07-14 NOTE — Progress Notes (Signed)
Remote pacemaker transmission.   

## 2018-07-28 ENCOUNTER — Encounter: Payer: Self-pay | Admitting: Internal Medicine

## 2018-07-28 ENCOUNTER — Other Ambulatory Visit: Payer: Self-pay

## 2018-07-28 ENCOUNTER — Telehealth (INDEPENDENT_AMBULATORY_CARE_PROVIDER_SITE_OTHER): Payer: Medicare Other | Admitting: Internal Medicine

## 2018-07-28 ENCOUNTER — Telehealth: Payer: Self-pay | Admitting: Cardiology

## 2018-07-28 VITALS — BP 136/74 | HR 84 | Temp 98.4°F | Ht 65.0 in | Wt 201.0 lb

## 2018-07-28 DIAGNOSIS — G4733 Obstructive sleep apnea (adult) (pediatric): Secondary | ICD-10-CM | POA: Diagnosis not present

## 2018-07-28 DIAGNOSIS — I10 Essential (primary) hypertension: Secondary | ICD-10-CM

## 2018-07-28 DIAGNOSIS — I480 Paroxysmal atrial fibrillation: Secondary | ICD-10-CM | POA: Diagnosis not present

## 2018-07-28 DIAGNOSIS — I442 Atrioventricular block, complete: Secondary | ICD-10-CM | POA: Diagnosis not present

## 2018-07-28 DIAGNOSIS — Z950 Presence of cardiac pacemaker: Secondary | ICD-10-CM | POA: Diagnosis not present

## 2018-07-28 NOTE — Telephone Encounter (Signed)
Returned call - LMTCB

## 2018-07-28 NOTE — Telephone Encounter (Signed)
Patient's wife would like for you to give her a call, she states Timmothy Sours has received his CPAP machine.

## 2018-07-28 NOTE — Progress Notes (Signed)
Electrophysiology TeleHealth Note   Due to national recommendations of social distancing due to COVID 19, an audio/video telehealth visit is felt to be most appropriate for this patient at this time.  See MyChart message from today for the patient's consent to telehealth for [facility].   Date:  07/28/2018   ID:  Gary [name], DOB 06/29/1932, MRN [id number]  Location: patient's home  Provider location: [address], [place]  Evaluation Performed: Follow-up visit  PCP:  Levin Erp, MD  Cardiologist:   [facility] Electrophysiologist:  SK   Chief Complaint:  *complete heart block  History of Present Illness:    Gary [name] is a 85 y.o. male who presents via audio/video conferencing for a telehealth visit today. telehealth visit   Since last being seen in our clinic for complete heart block, previously implanted pacemaker and spells of warmth  the patient reports no further nocturnal spells or syncope   Date Cr K Hgb  3*/20 1.76 4.8 13.9  3/20 1.93 5.5 13.9  4/20 1.56 3.9    Has had edema, amlodipine was discontinued,( or so I thought)  but not consummated   BP a little better now on CPAP  Underwent sleep study [facility] 68;  Dr TT working on CPAP   The patient denies symptoms of fevers, chills, cough, or new SOB worrisome for COVID 19.    Past Medical History:  Diagnosis Date  . Adenomatous colon polyp    2010   . Anemia   . Anxiety   . Arthritis    "hips and lower back"  . Arthritis pain   . Blood transfusion   . Chest pain 03/29/11   2D Echo without contrast on 03/29/11 - EF= 55-60%.  . Cholecystitis   . Chronic back pain greater than 3 months duration   . Chronic kidney disease   . Colon polyps   . Complication of anesthesia    once slow to wake up  . Coronary angioplasty status 2011  . Coronary artery disease   . Diarrhea   . Diverticulosis   . Dyslipidemia   . GERD (gastroesophageal reflux disease)   . GI bleeding after 07/2009   "triggered by  Plavix & ASA; from my diverticulosis"  . H/O Clostridium difficile infection   . H/O myocardial perfusion scan 04/04/11   For Pre-noncardiac surgery - EF = 67%, no ischemia, and is considered low risk.  Marland Kitchen Heart attack ([place]) 07/2009   nonSTEMI with an occluded circumflex artery and collaterals.  Marland Kitchen Heart murmur   . Hemorrhoids   . History of shingles   . Hyperlipidemia   . Hypertension   . Kidney stones   . Lower extremity edema    Taking furosemide and it seems to be helping.  . Neuromuscular disorder ([place])   . Nonspecific chest pain 08/26/2012   Went to ED 08/26/12  . Stomach problems   . Syncope and collapse 04/16/2018    Past Surgical History:  Procedure Laterality Date  . BACK SURGERY  1997  . CARDIAC CATHETERIZATION  08/09/2009   Circumflex 100% occluded with right-to-left collaterals, mild irregularities in the proximal and distal LAD and diagonal system, normal LV systolic function, and minor infrarenal irregularities, but no abdominal aortic aneurysm.  . CHOLECYSTECTOMY  04/12/2011   Procedure: CHOLECYSTECTOMY;  Surgeon: Adin Hector, MD;  Location: [facility];  Service: General;  Laterality: N/A;  . CHOLECYSTECTOMY  04/12/2011   Procedure: LAPAROSCOPIC CHOLECYSTECTOMY;  Surgeon: Renelda Loma  Alyssa Grove, MD;  Location: [facility];  Service: General;  Laterality: N/A;  converted to open  . COLON SURGERY     COLONOSCOPY  . ESOPHAGOGASTRODUODENOSCOPY N/A 08/05/2013   Procedure: ESOPHAGOGASTRODUODENOSCOPY (EGD);  Surgeon: Jerene Bears, MD;  Location: [facility];  Service: Gastroenterology;  Laterality: N/A;  . EYE SURGERY  1942   "right eye was crossed; they  corrected it"  . LUMBAR FUSION  07/22/2012  . PACEMAKER IMPLANT N/A 04/17/2018   Procedure: PACEMAKER IMPLANT;  Surgeon: Deboraha Sprang, MD;  Location: [facility] [facility];  Service: Cardiovascular;  Laterality: N/A;  . POSTERIOR FUSION LUMBAR SPINE  08/1995   "bottom 4 vertebra"  . SUBTOTAL COLECTOMY  01/24/2010  . TOTAL HIP ARTHROPLASTY  Right 08/03/2013   Procedure: TOTAL HIP ARTHROPLASTY ANTERIOR APPROACH;  Surgeon: Hessie Dibble, MD;  Location: [facility];  Service: Orthopedics;  Laterality: Right;    Current Outpatient Medications  Medication Sig Dispense Refill  . AMBULATORY NON FORMULARY MEDICATION Place 2 mLs rectally 3 (three) times daily. Medication Name: 5 % lidocaine ointment mixed with 0.125 nitroglycerin ointment 30 g 0  . amLODipine (NORVASC) 5 MG tablet Take 1 tablet (5 mg total) by mouth daily. 180 tablet 3  . ANDROGEL PUMP 20.25 MG/ACT (1.62%) GEL Apply 1 application topically as needed (for low level).     Marland Kitchen aspirin EC 81 MG tablet Take 81 mg by mouth daily.    . citalopram (CELEXA) 40 MG tablet Take 40 mg by mouth daily.    Marland Kitchen doxazosin (CARDURA) 8 MG tablet Take 8 mg by mouth daily.    . fluticasone (FLONASE) 50 MCG/ACT nasal spray Place 2 sprays into the nose daily.     . furosemide (LASIX) 20 MG tablet Take 20 mg by mouth 3 (three) times a week. Patient is taking 1 tablet on M/W/F    . gabapentin (NEURONTIN) 400 MG capsule Take 400 mg by mouth 3 (three) times daily.     . nitroGLYCERIN (NITROSTAT) 0.4 MG SL tablet PLACE 1 TABLET UNDER THE TONGUE EVERY 5 MINUTES AS NEEDED FOR CHEST PAIN FOR 3 DOSES 25 tablet 0  . simvastatin (ZOCOR) 40 MG tablet TAKE 1 TABLET BY MOUTH AT BEDTIME 90 tablet 2  . Tamsulosin HCl (FLOMAX) 0.4 MG CAPS Take 0.4 mg by mouth daily after supper.    . traMADol-acetaminophen (ULTRACET) 37.5-325 MG per tablet Take 2 tablets by mouth 3 (three) times daily.      No current facility-administered medications for this visit.     Allergies:   Naproxen, Clopidogrel bisulfate, Sulfonamide derivatives, Other, Latex, and Tape   Social History:  The patient  reports that he has never smoked. He has never used smokeless tobacco. He reports that he does not drink alcohol or use drugs.   Family History:  The patient's   family history includes Diverticulosis in his sister; Heart disease in his  father; Pancreatic cancer in his mother; Stroke in his father.   ROS:  Please see the history of present illness.   All other systems are personally reviewed and negative.    Exam:    Vital Signs:  BP 136/74   Pulse 84   Temp 98.4 F (36.9 C)   Ht 5\' 5"  (1.651 m)   Wt 201 lb (91.2 kg)   BMI 33.45 kg/m     Labs/Other Tests and Data Reviewed:    Recent Labs: 04/16/2018: ALT 15; TSH 0.899 04/17/2018: Hemoglobin 13.9; Platelets 164 06/05/2018: BUN 17;  Creatinine, Ser 1.56; Potassium 3.9; Sodium 139  Personally reviewed    Wt Readings from Last 3 Encounters:  07/28/18 201 lb (91.2 kg)  06/04/18 200 lb (90.7 kg)  04/18/18 206 lb (93.4 kg)     Other studies personally reviewed: Additional studies/ records that were reviewed today include: *As above **  Device function 5/20 was reviewed and s normal  Will need inoffice reprogramming of outputs   ASSESSMENT & PLAN:    Complete heart block intermittent  Syncope  Pacemaker Medtronic   Spells   Hypertension  BP is better and may improve further with sleep apnea therapy  No interval syncope  Device function normal  Will need reprogramming    COVID 19 screen The patient denies symptoms of COVID 19 at this time.  The importance of social distancing was discussed today.  Follow-up: [facility] device clinic    Current medicines are reviewed at length with the patient today.   The patient does not have concerns regarding his medicines.  The following changes were made today:   Have suggested with edema we try and wean him off of his amlodipine and will relay recommendation to his PCP and Primary Cardiology  Labs/ tests ordered today include:   No orders of the defined types were placed in this encounter.   Future tests ( post COVID )     Patient Risk:  after full review of this patients clinical status, I feel that they are at moderate*  risk at this time.  Today, I have spent7* minutes with the patient with telehealth  technology discussing the above.  Signed, Virl Axe, MD  07/28/2018 2:48 PM     [facility] [address] [place] [address] [phone] (office) [phone] (fax)

## 2018-07-31 NOTE — Telephone Encounter (Signed)
Per dpr made appt with wife. Patient has a 10 week follow up appointment scheduled for 09/10/2018. Patient understands he needs to keep this appointment for insurance compliance. Patient was grateful for the call and thanked me.

## 2018-07-31 NOTE — Telephone Encounter (Signed)
Patient's wife Mardene Celeste returned your call.

## 2018-08-13 ENCOUNTER — Other Ambulatory Visit: Payer: Self-pay | Admitting: Dermatology

## 2018-09-10 ENCOUNTER — Telehealth: Payer: Self-pay | Admitting: *Deleted

## 2018-09-10 ENCOUNTER — Ambulatory Visit: Payer: Medicare Other | Admitting: Cardiology

## 2018-09-10 ENCOUNTER — Encounter: Payer: Self-pay | Admitting: Cardiology

## 2018-09-10 ENCOUNTER — Other Ambulatory Visit: Payer: Self-pay

## 2018-09-10 ENCOUNTER — Telehealth (INDEPENDENT_AMBULATORY_CARE_PROVIDER_SITE_OTHER): Payer: Medicare Other | Admitting: Cardiology

## 2018-09-10 DIAGNOSIS — E669 Obesity, unspecified: Secondary | ICD-10-CM

## 2018-09-10 DIAGNOSIS — G4733 Obstructive sleep apnea (adult) (pediatric): Secondary | ICD-10-CM

## 2018-09-10 DIAGNOSIS — I10 Essential (primary) hypertension: Secondary | ICD-10-CM | POA: Diagnosis not present

## 2018-09-10 NOTE — Progress Notes (Signed)
Virtual Visit via Video Note   This visit type was conducted due to national recommendations for restrictions regarding the COVID-19 Pandemic (e.g. social distancing) in an effort to limit this patient's exposure and mitigate transmission in our community.  Due to his co-morbid illnesses, this patient is at least at moderate risk for complications without adequate follow up.  This format is felt to be most appropriate for this patient at this time.  All issues noted in this document were discussed and addressed.  A limited physical exam was performed with this format.  Please refer to the patient's chart for his consent to telehealth for [facility].   Evaluation Performed:  Follow-up visit  This visit type was conducted due to national recommendations for restrictions regarding the COVID-19 Pandemic (e.g. social distancing).  This format is felt to be most appropriate for this patient at this time.  All issues noted in this document were discussed and addressed.  No physical exam was performed (except for noted visual exam findings with Video Visits).  Please refer to the patient's chart (MyChart message for video visits and phone note for telephone visits) for the patient's consent to telehealth for [facility].  Date:  09/10/2018   ID:  Gary [name], DOB [birth year], MRN [id number]  Patient Location:  Home  Provider location:   [place]  PCP:  Levin Erp, MD  Sleep medicine:  Fransico Him, MD Electrophysiologist:  None   Chief Complaint:  OSA  History of Present Illness:    ROLLEN [name] is a 85 y.o. male who presents via audio/video conferencing for a telehealth visit today for evaluation of OSA.  This is an 85yo male with a hx of CAD, CHB, CKD, HTN followed by Dr. Caryl Comes.  He underwent home sleep study which showed severe OSA with an AHI of 68/hr and O2 sats as low as 87%.  He was placed on auto CPAP from 5 to 18cm H2O and is now here for followup.    He is doing well  with his CPAP device and thinks that he has gotten used to it.  He tried using a nasal pillow mask but it moved around too much on his face.  He then switched to a full face mask which still is not as tight on his face as he would like.  He feels the pressure is adequate.  Since going on CPAP he really hasn't had a full night of using it because he says that the machine gets real hot and shuts off during the night.  Also he gets frustrated with the mask.   He gets tired in the am and it is hard to get going for him.  He does have to nap some during the day.  He does not think that he snores.    The patient does not have symptoms concerning for COVID-19 infection (fever, chills, cough, or new shortness of breath).    Prior CV studies:   The following studies were reviewed today:  Home sleep study and PAP download  Past Medical History:  Diagnosis Date  . Adenomatous colon polyp    2010   . Anemia   . Anxiety   . Arthritis    "hips and lower back"  . Arthritis pain   . Blood transfusion   . Chest pain 03/29/11   2D Echo without contrast on 03/29/11 - EF= 55-60%.  . Cholecystitis   . Chronic back pain greater than 3 months duration   .  Chronic kidney disease   . Colon polyps   . Complication of anesthesia    once slow to wake up  . Coronary angioplasty status 2011  . Coronary artery disease   . Diarrhea   . Diverticulosis   . Dyslipidemia   . GERD (gastroesophageal reflux disease)   . GI bleeding after 07/2009   "triggered by Plavix & ASA; from my diverticulosis"  . H/O Clostridium difficile infection   . H/O myocardial perfusion scan 04/04/11   For Pre-noncardiac surgery - EF = 67%, no ischemia, and is considered low risk.  Marland Kitchen Heart attack ([place]) 07/2009   nonSTEMI with an occluded circumflex artery and collaterals.  Marland Kitchen Heart murmur   . Hemorrhoids   . History of shingles   . Hyperlipidemia   . Hypertension   . Kidney stones   . Lower extremity edema    Taking furosemide and it  seems to be helping.  . Neuromuscular disorder ([place])   . Nonspecific chest pain 08/26/2012   Went to ED 08/26/12  . Stomach problems   . Syncope and collapse 04/16/2018   Past Surgical History:  Procedure Laterality Date  . BACK SURGERY  1997  . CARDIAC CATHETERIZATION  08/09/2009   Circumflex 100% occluded with right-to-left collaterals, mild irregularities in the proximal and distal LAD and diagonal system, normal LV systolic function, and minor infrarenal irregularities, but no abdominal aortic aneurysm.  . CHOLECYSTECTOMY  04/12/2011   Procedure: CHOLECYSTECTOMY;  Surgeon: Adin Hector, MD;  Location: [place];  Service: General;  Laterality: N/A;  . CHOLECYSTECTOMY  04/12/2011   Procedure: LAPAROSCOPIC CHOLECYSTECTOMY;  Surgeon: Adin Hector, MD;  Location: [place];  Service: General;  Laterality: N/A;  converted to open  . COLON SURGERY     COLONOSCOPY  . ESOPHAGOGASTRODUODENOSCOPY N/A 08/05/2013   Procedure: ESOPHAGOGASTRODUODENOSCOPY (EGD);  Surgeon: Jerene Bears, MD;  Location: [place];  Service: Gastroenterology;  Laterality: N/A;  . EYE SURGERY  1942   "right eye was crossed; they  corrected it"  . LUMBAR FUSION  07/22/2012  . PACEMAKER IMPLANT N/A 04/17/2018   Procedure: PACEMAKER IMPLANT;  Surgeon: Deboraha Sprang, MD;  Location: [place] [facility];  Service: Cardiovascular;  Laterality: N/A;  . POSTERIOR FUSION LUMBAR SPINE  08/1995   "bottom 4 vertebra"  . SUBTOTAL COLECTOMY  01/24/2010  . TOTAL HIP ARTHROPLASTY Right 08/03/2013   Procedure: TOTAL HIP ARTHROPLASTY ANTERIOR APPROACH;  Surgeon: Hessie Dibble, MD;  Location: [place];  Service: Orthopedics;  Laterality: Right;     Current Meds  Medication Sig  . AMBULATORY NON FORMULARY MEDICATION Place 2 mLs rectally 3 (three) times daily. Medication Name: 5 % lidocaine ointment mixed with 0.125 nitroglycerin ointment  . aspirin EC 81 MG tablet Take 81 mg by mouth daily.  . citalopram (CELEXA) 40 MG tablet Take 40 mg by  mouth daily.  Marland Kitchen doxazosin (CARDURA) 8 MG tablet Take 8 mg by mouth daily.  . fluticasone (FLONASE) 50 MCG/ACT nasal spray Place 2 sprays into the nose daily.   . furosemide (LASIX) 20 MG tablet Take 20 mg by mouth 3 (three) times a week. Patient is taking 1 tablet on M/W/F  . gabapentin (NEURONTIN) 400 MG capsule Take 400 mg by mouth 3 (three) times daily.   . nitroGLYCERIN (NITROSTAT) 0.4 MG SL tablet PLACE 1 TABLET UNDER THE TONGUE EVERY 5 MINUTES AS NEEDED FOR CHEST PAIN FOR 3 DOSES  . simvastatin (ZOCOR) 40 MG tablet TAKE 1 TABLET BY  MOUTH AT BEDTIME  . Tamsulosin HCl (FLOMAX) 0.4 MG CAPS Take 0.4 mg by mouth daily after supper.  . traMADol-acetaminophen (ULTRACET) 37.5-325 MG per tablet Take 2 tablets by mouth 3 (three) times daily.      Allergies:   Naproxen, Clopidogrel bisulfate, Sulfonamide derivatives, Other, Latex, and Tape   Social History   Tobacco Use  . Smoking status: Never Smoker  . Smokeless tobacco: Never Used  Substance Use Topics  . Alcohol use: No  . Drug use: No     Family Hx: The patient's family history includes Diverticulosis in his sister; Heart disease in his father; Pancreatic cancer in his mother; Stroke in his father. There is no history of Colon cancer.  ROS:   Please see the history of present illness.     All other systems reviewed and are negative.   Labs/Other Tests and Data Reviewed:    Recent Labs: 04/16/2018: ALT 15; TSH 0.899 04/17/2018: Hemoglobin 13.9; Platelets 164 06/05/2018: BUN 17; Creatinine, Ser 1.56; Potassium 3.9; Sodium 139   Recent Lipid Panel Lab Results  Component Value Date/Time   CHOL  08/09/2009 09:49 AM    164        ATP III CLASSIFICATION:  <200     mg/dL   Desirable  200-239  mg/dL   Borderline High  >=240    mg/dL   High          TRIG 114 08/09/2009 09:49 AM   HDL 36 (L) 08/09/2009 09:49 AM   CHOLHDL 4.6 08/09/2009 09:49 AM   LDLCALC (H) 08/09/2009 09:49 AM    105        Total Cholesterol/HDL:CHD Risk  Coronary Heart Disease Risk Table                     Men   Women  1/2 Average Risk   3.4   3.3  Average Risk       5.0   4.4  2 X Average Risk   9.6   7.1  3 X Average Risk  23.4   11.0        Use the calculated Patient Ratio above and the CHD Risk Table to determine the patient's CHD Risk.        ATP III CLASSIFICATION (LDL):  <100     mg/dL   Optimal  100-129  mg/dL   Near or Above                    Optimal  130-159  mg/dL   Borderline  160-189  mg/dL   High  >190     mg/dL   Very High    Wt Readings from Last 3 Encounters:  09/10/18 201 lb (91.2 kg)  07/28/18 201 lb (91.2 kg)  06/04/18 200 lb (90.7 kg)     Objective:    Vital Signs:  Ht 5\' 5"  (1.651 m)   Wt 201 lb (91.2 kg)   BMI 33.45 kg/m    ASSESSMENT & PLAN:    1.  OSA - The pathophysiology of obstructive sleep apnea , it's cardiovascular consequences & modes of treatment including CPAP were discused with the patient in detail & they evidenced understanding. His AHI was 7.3/hr on auto PAP and 3% compliant.   I have asked him to reach out to [facility] and tell them what problems he is having with his device to see what they recommend.  I will followup with another telephone  visit in 2 weeks to make sure his issues are resolved.He has a mask leak which is likely increasing his AHI.    2.  Hypertension -BP has been running around 145/58mmHg -continue on doxazosin 8mg  daily  3.  Obesity - I have encouraged him to get into a routine exercise program and cut back on carbs and portions.   COVID-19 Education: The signs and symptoms of COVID-19 were discussed with the patient and how to seek care for testing (follow up with PCP or arrange E-visit).  The importance of social distancing was discussed today.  Patient Risk:   After full review of this patient's clinical status, I feel that they are at least moderate risk at this time.  Time:   Today, I have spent 20 minutes directly with the patient on telehealth  discussing medical problems including OSA, HTN, obesity.  We also reviewed the symptoms of COVID 19 and the ways to protect against contracting the virus with telehealth technology.  I spent an additional 5 minutes reviewing patient's chart including PAP compliance download.  Medication Adjustments/Labs and Tests Ordered: Current medicines are reviewed at length with the patient today.  Concerns regarding medicines are outlined above.  Tests Ordered: No orders of the defined types were placed in this encounter.  Medication Changes: No orders of the defined types were placed in this encounter.   Disposition:  Follow up in 2 week(s) with virtual visit  Signed, Fransico Him, MD  09/10/2018 3:57 PM    [place]

## 2018-09-10 NOTE — Telephone Encounter (Signed)
-----   Message from Dollene Primrose, RN sent at 09/10/2018  4:38 PM EDT ----- Regarding: Device issues Hi [name]  This is from Dr. Radford Pax.   Thanks! Lorren ----- Message ----- From: Sueanne Margarita, MD Sent: 09/10/2018   4:33 PM EDT To: Dollene Primrose, RN  Please send this to Gary [name] - please call Gary [name] to get a download. Also he is having a lot of problems with his device heating up and shutting off as well as mask problems which are reducing his compliance - they need to address this ASAP.  Followup with me in 2 weeks virtual visit.

## 2018-09-10 NOTE — Telephone Encounter (Signed)
Spoke to [facility] and [facility] will reach out to the patient ASAP to fix the problem.

## 2018-09-28 ENCOUNTER — Telehealth: Payer: Self-pay

## 2018-09-28 NOTE — Telephone Encounter (Signed)
    COVID-19 Pre-Screening Questions:  . In the past 7 to 10 days have you had a cough,  shortness of breath, headache, congestion, fever (100 or greater) body aches, chills, sore throat, or sudden loss of taste or sense of smell? No . Have you been around anyone with known Covid 19. No . Have you been around anyone who is awaiting Covid 19 test results in the past 7 to 10 days? No . Have you been around anyone who has been exposed to Covid 19, or has mentioned symptoms of Covid 19 within the past 7 to 10 days? No  If you have any concerns/questions about symptoms patients report during screening (either on the phone or at threshold). Contact the provider seeing the patient or DOD for further guidance.  If neither are available contact a member of the leadership team.         Pt wife answered no to all covid-19 prescreening questions. I asked her to make sure the pt wear a mask if he has one. I told her we are reducing the number of people coming into the office. She cut me off and states that someone will be with him. I asked her do he need help physically but she states the pt been having trouble with his blood pressure and someone will be with him for his appointment. She said have a nice day and that was it.

## 2018-09-29 ENCOUNTER — Ambulatory Visit (INDEPENDENT_AMBULATORY_CARE_PROVIDER_SITE_OTHER): Payer: Medicare Other | Admitting: *Deleted

## 2018-09-29 ENCOUNTER — Other Ambulatory Visit: Payer: Self-pay

## 2018-09-29 DIAGNOSIS — I442 Atrioventricular block, complete: Secondary | ICD-10-CM | POA: Diagnosis not present

## 2018-09-29 LAB — CUP PACEART INCLINIC DEVICE CHECK
Battery Remaining Longevity: 172 mo
Battery Voltage: 3.18 V
Brady Statistic AP VP Percent: 5.99 %
Brady Statistic AP VS Percent: 21.41 %
Brady Statistic AS VP Percent: 0.22 %
Brady Statistic AS VS Percent: 72.38 %
Brady Statistic RA Percent Paced: 27.45 %
Brady Statistic RV Percent Paced: 6.21 %
Date Time Interrogation Session: 20200818125910
Implantable Lead Implant Date: 20200306
Implantable Lead Implant Date: 20200306
Implantable Lead Location: 753859
Implantable Lead Location: 753860
Implantable Lead Model: 5076
Implantable Lead Model: 5076
Implantable Pulse Generator Implant Date: 20200306
Lead Channel Impedance Value: 342 Ohm
Lead Channel Impedance Value: 399 Ohm
Lead Channel Impedance Value: 494 Ohm
Lead Channel Impedance Value: 570 Ohm
Lead Channel Pacing Threshold Amplitude: 0.5 V
Lead Channel Pacing Threshold Amplitude: 0.625 V
Lead Channel Pacing Threshold Pulse Width: 0.4 ms
Lead Channel Pacing Threshold Pulse Width: 0.4 ms
Lead Channel Sensing Intrinsic Amplitude: 0.875 mV
Lead Channel Sensing Intrinsic Amplitude: 1.125 mV
Lead Channel Sensing Intrinsic Amplitude: 10.125 mV
Lead Channel Sensing Intrinsic Amplitude: 6.375 mV
Lead Channel Setting Pacing Amplitude: 1.5 V
Lead Channel Setting Pacing Amplitude: 2.5 V
Lead Channel Setting Pacing Pulse Width: 0.4 ms
Lead Channel Setting Sensing Sensitivity: 0.6 mV

## 2018-09-29 NOTE — Progress Notes (Signed)
Pacemaker check in clinic. Normal device function. Thresholds, sensing, impedances consistent with previous measurements. Device programmed to maximize longevity. No mode switch episodes noted. 1 high ventricular rates noted, available EGM shows 8 beat NSVT. Device programmed at appropriate safety margins. Histogram distribution appropriate for patient activity level. Device programmed to optimize intrinsic conduction. Estimated longevity 14.3 years. Patient enrolled in remote follow-up. Patient education completed.

## 2018-09-30 ENCOUNTER — Telehealth: Payer: Medicare Other | Admitting: Cardiology

## 2018-10-01 ENCOUNTER — Telehealth: Payer: Self-pay | Admitting: *Deleted

## 2018-10-01 NOTE — Telephone Encounter (Signed)
Informed patient of compliance results and verbalized understanding was indicated. Patient is aware and agreeable to AHI being OUT range at 27.6. Patient is aware and agreeable to being non compliant with machine usage. Patient/wife state the patient has been having trouble with the mask he was using because it contained magnets that was interfering with his pace maker. [facility] issued him another mask without any magnets (vitrera ff size med) on 8/3 to try out.

## 2018-10-01 NOTE — Telephone Encounter (Signed)
-----   Message from Sueanne Margarita, MD sent at 09/30/2018  7:52 PM EDT ----- Noncompliant and AHi too high.  Please find out what mask he is using and why he is not using

## 2018-10-01 NOTE — Telephone Encounter (Signed)
How long has been using the new mask

## 2018-10-02 ENCOUNTER — Ambulatory Visit (INDEPENDENT_AMBULATORY_CARE_PROVIDER_SITE_OTHER): Payer: Medicare Other | Admitting: *Deleted

## 2018-10-02 DIAGNOSIS — I442 Atrioventricular block, complete: Secondary | ICD-10-CM | POA: Diagnosis not present

## 2018-10-04 LAB — CUP PACEART REMOTE DEVICE CHECK
Battery Remaining Longevity: 172 mo
Battery Voltage: 3.18 V
Brady Statistic AP VP Percent: 8.36 %
Brady Statistic AP VS Percent: 5.39 %
Brady Statistic AS VP Percent: 0.02 %
Brady Statistic AS VS Percent: 86.22 %
Brady Statistic RA Percent Paced: 13.79 %
Brady Statistic RV Percent Paced: 8.39 %
Date Time Interrogation Session: 20200821043305
Implantable Lead Implant Date: 20200306
Implantable Lead Implant Date: 20200306
Implantable Lead Location: 753859
Implantable Lead Location: 753860
Implantable Lead Model: 5076
Implantable Lead Model: 5076
Implantable Pulse Generator Implant Date: 20200306
Lead Channel Impedance Value: 342 Ohm
Lead Channel Impedance Value: 418 Ohm
Lead Channel Impedance Value: 532 Ohm
Lead Channel Impedance Value: 608 Ohm
Lead Channel Pacing Threshold Amplitude: 0.5 V
Lead Channel Pacing Threshold Amplitude: 0.75 V
Lead Channel Pacing Threshold Pulse Width: 0.4 ms
Lead Channel Pacing Threshold Pulse Width: 0.4 ms
Lead Channel Sensing Intrinsic Amplitude: 2 mV
Lead Channel Sensing Intrinsic Amplitude: 2 mV
Lead Channel Sensing Intrinsic Amplitude: 6.5 mV
Lead Channel Sensing Intrinsic Amplitude: 6.5 mV
Lead Channel Setting Pacing Amplitude: 1.5 V
Lead Channel Setting Pacing Amplitude: 2.5 V
Lead Channel Setting Pacing Pulse Width: 0.4 ms
Lead Channel Setting Sensing Sensitivity: 0.6 mV

## 2018-10-05 NOTE — Telephone Encounter (Signed)
For two weeks.

## 2018-10-07 MED ORDER — FUROSEMIDE 20 MG PO TABS: 20.0000 mg | ORAL_TABLET | ORAL | 1 refills | Status: DC

## 2018-10-09 NOTE — Progress Notes (Signed)
Remote pacemaker transmission.   

## 2018-10-13 ENCOUNTER — Telehealth: Payer: Self-pay

## 2018-10-13 NOTE — Telephone Encounter (Signed)
CALLED AND SPOKE WITH PT ABOUT MEDS FOR APPT ON 10/15/18

## 2018-10-14 ENCOUNTER — Ambulatory Visit: Payer: Medicare Other | Admitting: Cardiology

## 2018-10-14 NOTE — Progress Notes (Signed)
Virtual Visit via telephone Note   This visit type was conducted due to national recommendations for restrictions regarding the COVID-19 Pandemic (e.g. social distancing) in an effort to limit this patient's exposure and mitigate transmission in our community.  Due to his co-morbid illnesses, this patient is at least at moderate risk for complications without adequate follow up.  This format is felt to be most appropriate for this patient at this time.  All issues noted in this document were discussed and addressed.  A limited physical exam was performed with this format.  Please refer to the patient's chart for his consent to telehealth for [facility].   Evaluation Performed:  Follow-up visit  This visit type was conducted due to national recommendations for restrictions regarding the COVID-19 Pandemic (e.g. social distancing).  This format is felt to be most appropriate for this patient at this time.  All issues noted in this document were discussed and addressed.  No physical exam was performed (except for noted visual exam findings with Video Visits).  Please refer to the patient's chart (MyChart message for video visits and phone note for telephone visits) for the patient's consent to telehealth for [facility].  Date:  10/15/2018   ID:  Gary [name], DOB 29-Apr-1932, MRN [id number]  Patient Location:  Home  Provider location:   [place]  PCP:  Levin Erp, MD  Cardiologist:  Virl Axe,  MD Sleep Medicine:  Fransico Him, MD Electrophysiologist:  None   Chief Complaint:  OSA  History of Present Illness:    Gary [name] is a 85 y.o. male who presents via audio/video conferencing for a telehealth visit today.    This is an 85yo male with a hx of CAD, CHB, CKD, HTN followed by Dr. Caryl Comes.  He underwent home sleep study which showed severe OSA with an AHI of 68/hr and O2 sats as low as 87%.  He was placed on auto CPAP from 5 to 18cm H2O and was seen back by me in July.   At that time his AHI was 7.3/hr on auto PAP but was only 3% compliant.  He was having problems with the nasal pillow mask moving around on his face and changed to a FFM but had the same problems.  He felt like the pressure was fine.  He was also having problems with the machine shutting off on its own during the night.  He talked with the DME regarding his device shutting off as well as the mask leak.  He has tried 2 different sizes of masks and a nasal pillow mask.  He thinks he needs a nasal pillow mask that is smaller because the current mask is leaking.  He also is breathing through his mouth and has mouth dryness.  He is not using a chin strap.  The patient does not have symptoms concerning for COVID-19 infection (fever, chills, cough, or new shortness of breath).   Prior CV studies:   The following studies were reviewed today:  PAP compliance download  Past Medical History:  Diagnosis Date   Adenomatous colon polyp    2010    Anemia    Anxiety    Arthritis    "hips and lower back"   Arthritis pain    Blood transfusion    Chest pain 03/29/11   2D Echo without contrast on 03/29/11 - EF= 55-60%.   Cholecystitis    Chronic back pain greater than 3 months duration    Chronic kidney disease  Colon polyps    Complication of anesthesia    once slow to wake up   Coronary angioplasty status 2011   Coronary artery disease    Diarrhea    Diverticulosis    Dyslipidemia    GERD (gastroesophageal reflux disease)    GI bleeding after 07/2009   "triggered by Plavix & ASA; from my diverticulosis"   H/O Clostridium difficile infection    H/O myocardial perfusion scan 04/04/11   For Pre-noncardiac surgery - EF = 67%, no ischemia, and is considered low risk.   Heart attack ([place]) 07/2009   nonSTEMI with an occluded circumflex artery and collaterals.   Heart murmur    Hemorrhoids    History of shingles    Hyperlipidemia    Hypertension    Kidney stones    Lower  extremity edema    Taking furosemide and it seems to be helping.   Neuromuscular disorder ([place])    Nonspecific chest pain 08/26/2012   Went to ED 08/26/12   Stomach problems    Syncope and collapse 04/16/2018   Past Surgical History:  Procedure Laterality Date   [place]  08/09/2009   Circumflex 100% occluded with right-to-left collaterals, mild irregularities in the proximal and distal LAD and diagonal system, normal LV systolic function, and minor infrarenal irregularities, but no abdominal aortic aneurysm.   CHOLECYSTECTOMY  04/12/2011   Procedure: CHOLECYSTECTOMY;  Surgeon: Adin Hector, MD;  Location: [place];  Service: General;  Laterality: N/A;   CHOLECYSTECTOMY  04/12/2011   Procedure: LAPAROSCOPIC CHOLECYSTECTOMY;  Surgeon: Adin Hector, MD;  Location: [place];  Service: General;  Laterality: N/A;  converted to open   COLON SURGERY     COLONOSCOPY   ESOPHAGOGASTRODUODENOSCOPY N/A 08/05/2013   Procedure: ESOPHAGOGASTRODUODENOSCOPY (EGD);  Surgeon: Jerene Bears, MD;  Location: [place];  Service: Gastroenterology;  Laterality: N/A;   EYE SURGERY  1942   "right eye was crossed; they  corrected it"   LUMBAR FUSION  07/22/2012   PACEMAKER IMPLANT N/A 04/17/2018   Procedure: PACEMAKER IMPLANT;  Surgeon: Deboraha Sprang, MD;  Location: [place] [facility];  Service: Cardiovascular;  Laterality: N/A;   POSTERIOR FUSION LUMBAR SPINE  08/1995   "bottom 4 vertebra"   SUBTOTAL COLECTOMY  01/24/2010   TOTAL HIP ARTHROPLASTY Right 08/03/2013   Procedure: TOTAL HIP ARTHROPLASTY ANTERIOR APPROACH;  Surgeon: Hessie Dibble, MD;  Location: [place];  Service: Orthopedics;  Laterality: Right;     No outpatient medications have been marked as taking for the 10/15/18 encounter (Telemedicine) with Sueanne Margarita, MD.     Allergies:   Naproxen, Clopidogrel bisulfate, Sulfonamide derivatives, Other, Latex, and Tape   Social History   Tobacco Use     Smoking status: Never Smoker   Smokeless tobacco: Never Used  Substance Use Topics   Alcohol use: No   Drug use: No     Family Hx: The patient's family history includes Diverticulosis in his sister; Heart disease in his father; Pancreatic cancer in his mother; Stroke in his father. There is no history of Colon cancer.  ROS:   Please see the history of present illness.     All other systems reviewed and are negative.   Labs/Other Tests and Data Reviewed:    Recent Labs: 04/16/2018: ALT 15; TSH 0.899 04/17/2018: Hemoglobin 13.9; Platelets 164 06/05/2018: BUN 17; Creatinine, Ser 1.56; Potassium 3.9; Sodium 139   Recent Lipid Panel Lab Results  Component  Value Date/Time   CHOL  08/09/2009 09:49 AM    164        ATP III CLASSIFICATION:  <200     mg/dL   Desirable  200-239  mg/dL   Borderline High  >=240    mg/dL   High          TRIG 114 08/09/2009 09:49 AM   HDL 36 (L) 08/09/2009 09:49 AM   CHOLHDL 4.6 08/09/2009 09:49 AM   LDLCALC (H) 08/09/2009 09:49 AM    105        Total Cholesterol/HDL:CHD Risk Coronary Heart Disease Risk Table                     Men   Women  1/2 Average Risk   3.4   3.3  Average Risk       5.0   4.4  2 X Average Risk   9.6   7.1  3 X Average Risk  23.4   11.0        Use the calculated Patient Ratio above and the CHD Risk Table to determine the patient's CHD Risk.        ATP III CLASSIFICATION (LDL):  <100     mg/dL   Optimal  100-129  mg/dL   Near or Above                    Optimal  130-159  mg/dL   Borderline  160-189  mg/dL   High  >190     mg/dL   Very High    Wt Readings from Last 3 Encounters:  10/15/18 201 lb (91.2 kg)  09/10/18 201 lb (91.2 kg)  07/28/18 201 lb (91.2 kg)     Objective:    Vital Signs:  BP (!) 142/81 (BP Location: Right Arm, Patient Position: Standing, Cuff Size: Normal)    Pulse 88    Temp 98 F (36.7 C) (Tympanic)    Wt 201 lb (91.2 kg)    BMI 33.45 kg/m     ASSESSMENT & PLAN:    1.  OSA -  The  PAP download was reviewed today and showed an AHI of 19.5/hr on auto cm H2O with 0% compliance in using more than 4 hours nightly.  His download still shows a large leak.  Unfortunately he is having a problem finding a mask that fits without leaking.  He likes the nasal pillow mask but it is too big.  I will get him a smaller nasal pillow mask along with a chin strap and hopefully this will improve his apneas and compliance.  I will see him back in 4 weeks.   2.  HTN - BP is controlled.  Continue Doxazosin 8mg  daily.    3.  Obesity - I have encouraged him to get into a routine exercise program and cut back on carbs and portions.    COVID-19 Education: The signs and symptoms of COVID-19 were discussed with the patient and how to seek care for testing (follow up with PCP or arrange E-visit).  The importance of social distancing was discussed today.  Patient Risk:   After full review of this patient's clinical status, I feel that they are at least moderate risk at this time.  Time:   Today, I have spent 20 minutes directly with the patient on telemedicine discussing medical problems including OSA, HTN, obesity.  We also reviewed the symptoms of COVID 19 and the ways to protect  against contracting the virus with telehealth technology.  I spent an additional 5 minutes reviewing patient's chart including PAP compliance download.  Medication Adjustments/Labs and Tests Ordered: Current medicines are reviewed at length with the patient today.  Concerns regarding medicines are outlined above.  Tests Ordered: No orders of the defined types were placed in this encounter.  Medication Changes: No orders of the defined types were placed in this encounter.   Disposition:  Follow up in 4 week(s) virtual  Signed, Fransico Him, MD  10/15/2018 11:20 AM    [place]

## 2018-10-15 ENCOUNTER — Encounter: Payer: Self-pay | Admitting: Cardiology

## 2018-10-15 ENCOUNTER — Other Ambulatory Visit: Payer: Self-pay

## 2018-10-15 ENCOUNTER — Telehealth: Payer: Self-pay | Admitting: *Deleted

## 2018-10-15 ENCOUNTER — Telehealth (INDEPENDENT_AMBULATORY_CARE_PROVIDER_SITE_OTHER): Payer: Medicare Other | Admitting: Cardiology

## 2018-10-15 VITALS — BP 142/81 | HR 88 | Temp 98.0°F | Wt 201.0 lb

## 2018-10-15 DIAGNOSIS — E669 Obesity, unspecified: Secondary | ICD-10-CM | POA: Diagnosis not present

## 2018-10-15 DIAGNOSIS — G4733 Obstructive sleep apnea (adult) (pediatric): Secondary | ICD-10-CM

## 2018-10-15 DIAGNOSIS — I10 Essential (primary) hypertension: Secondary | ICD-10-CM | POA: Diagnosis not present

## 2018-10-15 NOTE — Patient Instructions (Signed)
Medication Instructions:  Your physician recommends that you continue on your current medications as directed. Please refer to the Current Medication list given to you today.  If you need a refill on your cardiac medications before your next appointment, please call your pharmacy.   Lab work: None ordered If you have labs (blood work) drawn today and your tests are completely normal, you will receive your results only by: Marland Kitchen MyChart Message (if you have MyChart) OR . A paper copy in the mail If you have any lab test that is abnormal or we need to change your treatment, we will call you to review the results.  Testing/Procedures: None ordered  Follow-Up: At [facility], you and your health needs are our priority.  As part of our continuing mission to provide you with exceptional heart care, we have created designated Provider Care Teams.  These Care Teams include your primary Cardiologist (physician) and Advanced Practice Providers (APPs -  Physician Assistants and Nurse Practitioners) who all work together to provide you with the care you need, when you need it.  You will need a follow up appointment in 4 weeks virtual with Dr. Fransico Him.    Any Other Special Instructions Will Be Listed Below (If Applicable).

## 2018-10-15 NOTE — Telephone Encounter (Signed)
Per Dr Theodosia Blender note on 10/15/18 order was placed for a smaller nasal pillow mask along with a chin strap to [facility] care via community message.

## 2018-10-28 ENCOUNTER — Telehealth: Payer: Self-pay | Admitting: Cardiology

## 2018-10-28 NOTE — Telephone Encounter (Signed)
Returned triage call to patient.   He reports elevated BP for the last 3-4 weeks - SBP 165-175.  Yesterday 657/903, 833 systolic Today's BP was 383/29 this AM   His BP was too high at [facility].   His last visit with Dr. Debara Pickett was 10/03/2017  Patient takes lasix 20mg  QOD, doxazosin 8mg   Will route to MD for suggested about BP/medication changes

## 2018-10-28 NOTE — Telephone Encounter (Signed)
LMTCB Advice per MD sent to MyChart as well

## 2018-10-28 NOTE — Telephone Encounter (Signed)
New Message   Pt c/o BP issue:  1. What are your last 5 BP readings? 165/73,  2. Are you having any other symptoms (ex. Dizziness, headache, blurred vision, passed out)? Left arm has been hurting, some dizziness.  3. What is your medication issue? No   Patient went to rehab yesterday but at the time of the call they could not locate the paper.

## 2018-10-28 NOTE — Telephone Encounter (Signed)
Start amlodipine 5 mg daily. Will need to be seen in follow-up of BP's in the office in 3-4 weeks. He could track BP at home and provide [place] numbers after treatment.  Dr. Lemmie Evens

## 2018-10-30 MED ORDER — AMLODIPINE BESYLATE 5 MG PO TABS: 5.0000 mg | ORAL_TABLET | Freq: Every day | ORAL | 3 refills | Status: DC

## 2018-10-30 NOTE — Telephone Encounter (Signed)
Patient responed via MyChart message that OK to Rx new medication. Rx(s) sent to pharmacy electronically.

## 2018-11-25 ENCOUNTER — Other Ambulatory Visit: Payer: Self-pay

## 2018-11-25 DIAGNOSIS — Z20822 Contact with and (suspected) exposure to covid-19: Secondary | ICD-10-CM

## 2018-11-26 LAB — NOVEL CORONAVIRUS, NAA: SARS-CoV-2, NAA: NOT DETECTED

## 2018-11-30 ENCOUNTER — Telehealth: Payer: Self-pay | Admitting: Internal Medicine

## 2018-11-30 ENCOUNTER — Other Ambulatory Visit: Payer: Self-pay

## 2018-11-30 ENCOUNTER — Telehealth (INDEPENDENT_AMBULATORY_CARE_PROVIDER_SITE_OTHER): Payer: Medicare Other | Admitting: Cardiology

## 2018-11-30 DIAGNOSIS — G4733 Obstructive sleep apnea (adult) (pediatric): Secondary | ICD-10-CM | POA: Diagnosis not present

## 2018-11-30 DIAGNOSIS — I10 Essential (primary) hypertension: Secondary | ICD-10-CM

## 2018-11-30 DIAGNOSIS — E669 Obesity, unspecified: Secondary | ICD-10-CM

## 2018-11-30 NOTE — Progress Notes (Signed)
Virtual Visit via Telephone Note   This visit type was conducted due to national recommendations for restrictions regarding the COVID-19 Pandemic (e.g. social distancing) in an effort to limit this patient's exposure and mitigate transmission in our community.  Due to his co-morbid illnesses, this patient is at least at moderate risk for complications without adequate follow up.  This format is felt to be most appropriate for this patient at this time.  All issues noted in this document were discussed and addressed.  A limited physical exam was performed with this format.  Please refer to the patient's chart for his consent to telehealth for [facility].   Evaluation Performed:  Follow-up visit  This visit type was conducted due to national recommendations for restrictions regarding the COVID-19 Pandemic (e.g. social distancing).  This format is felt to be most appropriate for this patient at this time.  All issues noted in this document were discussed and addressed.  No physical exam was performed (except for noted visual exam findings with Video Visits).  Please refer to the patient's chart (MyChart message for video visits and phone note for telephone visits) for the patient's consent to telehealth for [facility].  Date:  11/30/2018   ID:  Gary [name], DOB [birth year], MRN [id number]  Patient Location:  Home  Provider location:   [place]  PCP:  Levin Erp, MD  Cardiologist:  Mali Hilty, MD Sleep Medicine:  Fransico Him, MD Electrophysiologist:  None   Chief Complaint:  OSA  History of Present Illness:    Gary [name] is a 85 y.o. male who presents via audio/video conferencing for a telehealth visit today.    This is an 85yo male with a hx of CAD, CHB, CKD, HTN followed by Dr. Caryl Comes. He underwent home sleep study which showed severe OSA with an AHI of 68/hr and O2 sats as low as 87%. He was placed on auto CPAP from 5 to 18cm H2O and was seen back by me in July.   At that time his AHI was 7.3/hr on auto PAP but was only 3% compliant.  He was having problems with the nasal pillow mask moving around on his face and changed to a FFM but had the same problems.  He felt like the pressure was fine.  He was also having problems with the machine shutting off on its own during the night.  He talked with the DME regarding his device shutting off as well as the mask leak.  St last OV I ordered a smaller nasal pillow mask and chin strap and now he is back for followup.  He is doing well with his CPAP device and thinks that he has gotten used to it.  He tolerates the mask and feels the pressure is adequate.  Since going on CPAP he feels rested in the am and has no significant daytime sleepiness.  He denies any significant mouth or nasal dryness or nasal congestion.  He does not think that he snores.    The patient does not have symptoms concerning for COVID-19 infection (fever, chills, cough, or new shortness of breath).   Prior CV studies:   The following studies were reviewed today:  PAP compliance download  Past Medical History:  Diagnosis Date  . Adenomatous colon polyp    2010   . Anemia   . Anxiety   . Arthritis    "hips and lower back"  . Arthritis pain   . Blood transfusion   .  Chest pain 03/29/11   2D Echo without contrast on 03/29/11 - EF= 55-60%.  . Cholecystitis   . Chronic back pain greater than 3 months duration   . Chronic kidney disease   . Colon polyps   . Complication of anesthesia    once slow to wake up  . Coronary angioplasty status 2011  . Coronary artery disease   . Diarrhea   . Diverticulosis   . Dyslipidemia   . GERD (gastroesophageal reflux disease)   . GI bleeding after 07/2009   "triggered by Plavix & ASA; from my diverticulosis"  . H/O Clostridium difficile infection   . H/O myocardial perfusion scan 04/04/11   For Pre-noncardiac surgery - EF = 67%, no ischemia, and is considered low risk.  Marland Kitchen Heart attack ([place]) 07/2009    nonSTEMI with an occluded circumflex artery and collaterals.  Marland Kitchen Heart murmur   . Hemorrhoids   . History of shingles   . Hyperlipidemia   . Hypertension   . Kidney stones   . Lower extremity edema    Taking furosemide and it seems to be helping.  . Neuromuscular disorder ([place])   . Nonspecific chest pain 08/26/2012   Went to ED 08/26/12  . Stomach problems   . Syncope and collapse 04/16/2018   Past Surgical History:  Procedure Laterality Date  . BACK SURGERY  1997  . CARDIAC CATHETERIZATION  08/09/2009   Circumflex 100% occluded with right-to-left collaterals, mild irregularities in the proximal and distal LAD and diagonal system, normal LV systolic function, and minor infrarenal irregularities, but no abdominal aortic aneurysm.  . CHOLECYSTECTOMY  04/12/2011   Procedure: CHOLECYSTECTOMY;  Surgeon: Adin Hector, MD;  Location: [place];  Service: General;  Laterality: N/A;  . CHOLECYSTECTOMY  04/12/2011   Procedure: LAPAROSCOPIC CHOLECYSTECTOMY;  Surgeon: Adin Hector, MD;  Location: [place];  Service: General;  Laterality: N/A;  converted to open  . COLON SURGERY     COLONOSCOPY  . ESOPHAGOGASTRODUODENOSCOPY N/A 08/05/2013   Procedure: ESOPHAGOGASTRODUODENOSCOPY (EGD);  Surgeon: Jerene Bears, MD;  Location: [place];  Service: Gastroenterology;  Laterality: N/A;  . EYE SURGERY  1942   "right eye was crossed; they  corrected it"  . LUMBAR FUSION  07/22/2012  . PACEMAKER IMPLANT N/A 04/17/2018   Procedure: PACEMAKER IMPLANT;  Surgeon: Deboraha Sprang, MD;  Location: [place] [facility];  Service: Cardiovascular;  Laterality: N/A;  . POSTERIOR FUSION LUMBAR SPINE  08/1995   "bottom 4 vertebra"  . SUBTOTAL COLECTOMY  01/24/2010  . TOTAL HIP ARTHROPLASTY Right 08/03/2013   Procedure: TOTAL HIP ARTHROPLASTY ANTERIOR APPROACH;  Surgeon: Hessie Dibble, MD;  Location: [place];  Service: Orthopedics;  Laterality: Right;     No outpatient medications have been marked as taking for the 11/30/18  encounter (Telemedicine) with Sueanne Margarita, MD.     Allergies:   Naproxen, Clopidogrel bisulfate, Sulfonamide derivatives, Other, Latex, and Tape   Social History   Tobacco Use  . Smoking status: Never Smoker  . Smokeless tobacco: Never Used  Substance Use Topics  . Alcohol use: No  . Drug use: No     Family Hx: The patient's family history includes Diverticulosis in his sister; Heart disease in his father; Pancreatic cancer in his mother; Stroke in his father. There is no history of Colon cancer.  ROS:   Please see the history of present illness.     All other systems reviewed and are negative.   Labs/Other Tests and  Data Reviewed:    Recent Labs: 04/16/2018: ALT 15; TSH 0.899 04/17/2018: Hemoglobin 13.9; Platelets 164 06/05/2018: BUN 17; Creatinine, Ser 1.56; Potassium 3.9; Sodium 139   Recent Lipid Panel Lab Results  Component Value Date/Time   CHOL  08/09/2009 09:49 AM    164        ATP III CLASSIFICATION:  <200     mg/dL   Desirable  200-239  mg/dL   Borderline High  >=240    mg/dL   High          TRIG 114 08/09/2009 09:49 AM   HDL 36 (L) 08/09/2009 09:49 AM   CHOLHDL 4.6 08/09/2009 09:49 AM   LDLCALC (H) 08/09/2009 09:49 AM    105        Total Cholesterol/HDL:CHD Risk Coronary Heart Disease Risk Table                     Men   Women  1/2 Average Risk   3.4   3.3  Average Risk       5.0   4.4  2 X Average Risk   9.6   7.1  3 X Average Risk  23.4   11.0        Use the calculated Patient Ratio above and the CHD Risk Table to determine the patient's CHD Risk.        ATP III CLASSIFICATION (LDL):  <100     mg/dL   Optimal  100-129  mg/dL   Near or Above                    Optimal  130-159  mg/dL   Borderline  160-189  mg/dL   High  >190     mg/dL   Very High    Wt Readings from Last 3 Encounters:  10/15/18 201 lb (91.2 kg)  09/10/18 201 lb (91.2 kg)  07/28/18 201 lb (91.2 kg)     Objective:    Vital Signs:  There were no vitals taken for this  visit.   ASSESSMENT & PLAN:    1.  OSA - The patient is tolerating PAP therapy well without any problems. The PAP download was reviewed today and showed an AHI of 6.1/hr on auto PAP with 40% compliance in using more than 4 hours nightly for the last 5 days. The patient has been using and benefiting from PAP use and will continue to benefit from therapy. I have encouraged him to try to avoid sleeping supine.  Due to the noise in the machine he has only been using the device 3 hours nightly because the noise keeps him and his wife up.  I have encouraged him to call for an  appt with the DME to check out the noise in his PAP machine.   2.  HTN -continue Doxazosin 8mg  daily and amlodipine 5mg  daily  3.  Obesity -I have encouraged him to get into a routine exercise program and cut back on carbs and portions.   COVID-19 Education: The signs and symptoms of COVID-19 were discussed with the patient and how to seek care for testing (follow up with PCP or arrange E-visit).  The importance of social distancing was discussed today.  Patient Risk:   After full review of this patient's clinical status, I feel that they are at least moderate risk at this time.  Time:   Today, I have spent 20 minutes directly with the patient on telemedicine discussing medical  problems including OSA< obesity, HTN.  We also reviewed the symptoms of COVID 19 and the ways to protect against contracting the virus with telehealth technology.  I spent an additional 5 minutes reviewing patient's chart including PAP compliance download.  Medication Adjustments/Labs and Tests Ordered: Current medicines are reviewed at length with the patient today.  Concerns regarding medicines are outlined above.  Tests Ordered: No orders of the defined types were placed in this encounter.  Medication Changes: No orders of the defined types were placed in this encounter.   Disposition:  Follow up in 3 months  Signed, Fransico Him, MD   11/30/2018 11:34 AM    [place]

## 2018-11-30 NOTE — Telephone Encounter (Signed)
Please advise if okay to return to PT

## 2018-11-30 NOTE — Telephone Encounter (Signed)
Will route to primary to complete note.

## 2018-11-30 NOTE — Patient Instructions (Signed)
Medication Instructions:  Your physician recommends that you continue on your current medications as directed. Please refer to the Current Medication list given to you today.  *If you need a refill on your cardiac medications before your next appointment, please call your pharmacy*  Lab Work: None Ordered   Testing/Procedures: None Ordered   Follow-Up: At [facility], you and your health needs are our priority.  As part of our continuing mission to provide you with exceptional heart care, we have created designated Provider Care Teams.  These Care Teams include your primary Cardiologist (physician) and Advanced Practice Providers (APPs -  Physician Assistants and Nurse Practitioners) who all work together to provide you with the care you need, when you need it.  Your next appointment:   3 months  The format for your next appointment:   Virtual Visit   Provider:   You may see Dr. Radford Pax or one of the following Advanced Practice Providers on your designated Care Team:    Melina Copa, PA-C  Ermalinda Barrios, PA-C

## 2018-11-30 NOTE — Telephone Encounter (Signed)
Ok to return to PT  Dr Lemmie Evens

## 2018-11-30 NOTE — Telephone Encounter (Signed)
New Message    Patient states he needs note to start physical therapy again.  Please call to discuss.

## 2018-12-01 NOTE — Telephone Encounter (Signed)
Letter composed. Sent to patient in [place].

## 2018-12-15 NOTE — Telephone Encounter (Signed)
Spoke with [place] Patient is presently for PT but is having elevated BP Their threshold for therapy exercise is 160/100 Patient has had 2 incidences where BP is 160/88  (dropped to 150/80 after rest one day and then 158/88 another day). He has only been able to do stretching/massage and not formal exercise.   Therapy department would like to see if his BP can be addressed. They report he is anxious when he comes in so they allow him to rest in a dimly lit room before manually checking BP but it is persistently elevated.   Will route to MD to review/advise - HTN clinic appt or MD visit warranted? Recent med change (added amlodipine) on 9/16

## 2018-12-17 ENCOUNTER — Telehealth: Payer: Self-pay | Admitting: Internal Medicine

## 2018-12-17 NOTE — Telephone Encounter (Signed)
I would increase amlodipine from 5 to 10 mg daily. Monitor.  Dr. Lemmie Evens

## 2018-12-17 NOTE — Telephone Encounter (Signed)
Returned the call to the patient. He stated that his PCP has taken him off of the Amlodipine. He has been off for 2 weeks.   According to previous note in epic: Spoke with Gary [name] Patient is presently for PT but is having elevated BP Their threshold for therapy exercise is 160/100 Patient has had 2 incidences where BP is 160/88  (dropped to 150/80 after rest one day and then 158/88 another day). He has only been able to do stretching/massage and not formal exercise.   Therapy department would like to see if his BP can be addressed. They report he is anxious when he comes in so they allow him to rest in a dimly lit room before manually checking BP but it is persistently elevated.   It was advised that the patient increase his Amlodipine to 10 mg daily. Will route to the provider to see if he should go back on the 5 mg or increase to 10 mg of the Amlodipine.

## 2018-12-17 NOTE — Telephone Encounter (Signed)
° °  Pt c/o medication issue:  1. Name of Medication: amLODipine (NORVASC) 5 MG tablet  2. How are you currently taking this medication (dosage and times per day)? Pt reports he stopped taking the medication   3. Are you having a reaction (difficulty breathing--STAT)?   4. What is your medication issue?   The patient took all of his medications to his PCP for evaluation, and the PCP feels that the patient does not need to be on this medication. The patient is uneasy with this decision, and just wants to verify everything with Dr. Debara Pickett before he decides to stay off of this medication.   The pharmacy  called him and warned him about some potential side effects from multiple medications, which is why he brought the issue up to his PCP.

## 2018-12-18 NOTE — Telephone Encounter (Signed)
Go back on 5 - thanks.  Dr Lemmie Evens

## 2018-12-18 NOTE — Telephone Encounter (Signed)
Per another note, patient has been off medication. MD had advised to resume amlodipine. See alternate note.

## 2018-12-18 NOTE — Telephone Encounter (Signed)
Patient notified of MD advice via [place]

## 2018-12-21 NOTE — Telephone Encounter (Signed)
Patient viewed MyChart message and acknowledged MD suggestion for medication

## 2018-12-23 ENCOUNTER — Other Ambulatory Visit: Payer: Self-pay | Admitting: Dermatology

## 2019-01-01 ENCOUNTER — Ambulatory Visit (INDEPENDENT_AMBULATORY_CARE_PROVIDER_SITE_OTHER): Payer: Medicare Other | Admitting: *Deleted

## 2019-01-01 DIAGNOSIS — I442 Atrioventricular block, complete: Secondary | ICD-10-CM | POA: Diagnosis not present

## 2019-01-01 DIAGNOSIS — R55 Syncope and collapse: Secondary | ICD-10-CM

## 2019-01-01 LAB — CUP PACEART REMOTE DEVICE CHECK
Battery Remaining Longevity: 169 mo
Battery Voltage: 3.15 V
Brady Statistic AP VP Percent: 6.88 %
Brady Statistic AP VS Percent: 9.39 %
Brady Statistic AS VP Percent: 1.11 %
Brady Statistic AS VS Percent: 82.61 %
Brady Statistic RA Percent Paced: 16.44 %
Brady Statistic RV Percent Paced: 8 %
Date Time Interrogation Session: 20201120022402
Implantable Lead Implant Date: 20200306
Implantable Lead Implant Date: 20200306
Implantable Lead Location: 753859
Implantable Lead Location: 753860
Implantable Lead Model: 5076
Implantable Lead Model: 5076
Implantable Pulse Generator Implant Date: 20200306
Lead Channel Impedance Value: 323 Ohm
Lead Channel Impedance Value: 380 Ohm
Lead Channel Impedance Value: 494 Ohm
Lead Channel Impedance Value: 532 Ohm
Lead Channel Pacing Threshold Amplitude: 0.625 V
Lead Channel Pacing Threshold Amplitude: 0.625 V
Lead Channel Pacing Threshold Pulse Width: 0.4 ms
Lead Channel Pacing Threshold Pulse Width: 0.4 ms
Lead Channel Sensing Intrinsic Amplitude: 1.25 mV
Lead Channel Sensing Intrinsic Amplitude: 1.25 mV
Lead Channel Sensing Intrinsic Amplitude: 5.5 mV
Lead Channel Sensing Intrinsic Amplitude: 5.5 mV
Lead Channel Setting Pacing Amplitude: 1.5 V
Lead Channel Setting Pacing Amplitude: 2.5 V
Lead Channel Setting Pacing Pulse Width: 0.4 ms
Lead Channel Setting Sensing Sensitivity: 0.6 mV

## 2019-01-11 ENCOUNTER — Ambulatory Visit: Payer: Medicare Other

## 2019-02-01 NOTE — Progress Notes (Signed)
Remote pacemaker transmission.   

## 2019-02-17 ENCOUNTER — Telehealth: Payer: Self-pay

## 2019-02-17 NOTE — Telephone Encounter (Signed)
Wife called wanting pt appointment moved up but we do not currently have any openings. Advised wife to take him to the er or urgent care. Wife agrees

## 2019-03-01 ENCOUNTER — Ambulatory Visit: Payer: Self-pay | Admitting: Nurse Practitioner

## 2019-03-01 ENCOUNTER — Encounter: Payer: Self-pay | Admitting: Nurse Practitioner

## 2019-03-03 NOTE — Progress Notes (Signed)
Virtual Visit via Video Note   This visit type was conducted due to national recommendations for restrictions regarding the COVID-19 Pandemic (e.g. social distancing) in an effort to limit this patient's exposure and mitigate transmission in our community.  Due to his co-morbid illnesses, this patient is at least at moderate risk for complications without adequate follow up.  This format is felt to be most appropriate for this patient at this time.  All issues noted in this document were discussed and addressed.  A limited physical exam was performed with this format.  Please refer to the patient's chart for his consent to telehealth for [facility]   Evaluation Performed:  Follow-up visit  This visit type was conducted due to national recommendations for restrictions regarding the COVID-19 Pandemic (e.g. social distancing).  This format is felt to be most appropriate for this patient at this time.  All issues noted in this document were discussed and addressed.  No physical exam was performed (except for noted visual exam findings with Video Visits).  Please refer to the patient's chart (MyChart message for video visits and phone note for telephone visits) for the patient's consent to telehealth for [facility].  Date:  03/04/2019   ID:  Gary [name], DOB 1932/08/24, MRN [id number]  Patient Location:  Home  Provider location:   [place]  PCP:  Minette Brine, FNP  Cardiologist:  Mali Hilty, MD Sleep Medicine:  Fransico Him, MD Electrophysiologist:  None   Chief Complaint:  OSA  History of Present Illness:    Gary [name] is a 86 y.o. male who presents via audio/video conferencing for a telehealth visit today.    This is an 86yo male with a hx of CAD, CHB, CKD, HTN followed by Dr. Caryl Comes. He underwent home sleep [name] which showed severe OSA with an AHI of 68/hr and O2 sats as low as 87%. He was placed on auto CPAP from 5 to 18cm H2O andwas seen back by me in July. At  that time his AHI was 7.3/hr on auto PAP but was only 3% compliant. He was having problems with the nasal pillow mask moving around on his face and changed to a FFM but had the same problems. He felt like the pressure was fine. He was also having problems with the machine shutting off on its own during the night. He talked with the DME regarding his device shutting off as well as the mask leak.  A smaller nasal pillow mask and chin strap.  He was seen back by me in October 2020 and compliance had improved to 40%.  There was noise in the device that was keeping him and his wife up and I asked him to call his DME.  After that appt he got a new device but now he is still not using it.  He is using a nasal pillow mask and it seems to have a significant leak. He is using his chin strap but his AHI is elevated likely related to mask leak.  His wife tells me he has been very confused recently and she is taking him to his PCP.  The patient does not have symptoms concerning for COVID-19 infection (fever, chills, cough, or new shortness of breath).   Prior CV studies:   The following studies were reviewed today:  PAP compliance download  Past Medical History:  Diagnosis Date  . Adenomatous colon polyp    2010   . Anemia   . Anxiety   .  Arthritis    "hips and lower back"  . Arthritis pain   . Blood transfusion   . Chest pain 03/29/11   2D Echo without contrast on 03/29/11 - EF= 55-60%.  . Cholecystitis   . Chronic back pain greater than 3 months duration   . Chronic kidney disease   . Colon polyps   . Complication of anesthesia    once slow to wake up  . Coronary angioplasty status 2011  . Coronary artery disease   . Diarrhea   . Diverticulosis   . Dyslipidemia   . GERD (gastroesophageal reflux disease)   . GI bleeding after 07/2009   "triggered by Plavix & ASA; from my diverticulosis"  . H/O Clostridium difficile infection   . H/O myocardial perfusion scan 04/04/11   For Pre-noncardiac  surgery - EF = 67%, no ischemia, and is considered low risk.  Marland Kitchen Heart attack ([place]) 07/2009   nonSTEMI with an occluded circumflex artery and collaterals.  Marland Kitchen Heart murmur   . Hemorrhoids   . History of shingles   . Hyperlipidemia   . Hypertension   . Kidney stones   . Lower extremity edema    Taking furosemide and it seems to be helping.  . Neuromuscular disorder ([place])   . Nonspecific chest pain 08/26/2012   Went to ED 08/26/12  . Stomach problems   . Syncope and collapse 04/16/2018   Past Surgical History:  Procedure Laterality Date  . BACK SURGERY  1997  . CARDIAC CATHETERIZATION  08/09/2009   Circumflex 100% occluded with right-to-left collaterals, mild irregularities in the proximal and distal LAD and diagonal system, normal LV systolic function, and minor infrarenal irregularities, but no abdominal aortic aneurysm.  . CHOLECYSTECTOMY  04/12/2011   Procedure: CHOLECYSTECTOMY;  Surgeon: Adin Hector, MD;  Location: [place];  Service: General;  Laterality: N/A;  . CHOLECYSTECTOMY  04/12/2011   Procedure: LAPAROSCOPIC CHOLECYSTECTOMY;  Surgeon: Adin Hector, MD;  Location: [place];  Service: General;  Laterality: N/A;  converted to open  . COLON SURGERY     COLONOSCOPY  . ESOPHAGOGASTRODUODENOSCOPY N/A 08/05/2013   Procedure: ESOPHAGOGASTRODUODENOSCOPY (EGD);  Surgeon: Jerene Bears, MD;  Location: [place];  Service: Gastroenterology;  Laterality: N/A;  . EYE SURGERY  1942   "right eye was crossed; they  corrected it"  . LUMBAR FUSION  07/22/2012  . PACEMAKER IMPLANT N/A 04/17/2018   Procedure: PACEMAKER IMPLANT;  Surgeon: Deboraha Sprang, MD;  Location: [place] [facility];  Service: Cardiovascular;  Laterality: N/A;  . POSTERIOR FUSION LUMBAR SPINE  08/1995   "bottom 4 vertebra"  . SUBTOTAL COLECTOMY  01/24/2010  . TOTAL HIP ARTHROPLASTY Right 08/03/2013   Procedure: TOTAL HIP ARTHROPLASTY ANTERIOR APPROACH;  Surgeon: Hessie Dibble, MD;  Location: [place];  Service: Orthopedics;   Laterality: Right;     Current Meds  Medication Sig  . AMBULATORY NON FORMULARY MEDICATION Place 2 mLs rectally 3 (three) times daily. Medication Name: 5 % lidocaine ointment mixed with 0.125 nitroglycerin ointment  . amLODipine (NORVASC) 5 MG tablet Take 1 tablet (5 mg total) by mouth daily.  Marland Kitchen aspirin EC 81 MG tablet Take 81 mg by mouth daily.  . citalopram (CELEXA) 40 MG tablet Take 40 mg by mouth daily.  Marland Kitchen doxazosin (CARDURA) 8 MG tablet Take 8 mg by mouth daily.  . fluticasone (FLONASE) 50 MCG/ACT nasal spray Place 2 sprays into the nose daily.   . furosemide (LASIX) 20 MG tablet Take 1 tablet (  20 mg total) by mouth every other day.  . gabapentin (NEURONTIN) 400 MG capsule Take 400 mg by mouth 3 (three) times daily.   . nitroGLYCERIN (NITROSTAT) 0.4 MG SL tablet PLACE 1 TABLET UNDER THE TONGUE EVERY 5 MINUTES AS NEEDED FOR CHEST PAIN FOR 3 DOSES  . simvastatin (ZOCOR) 40 MG tablet TAKE 1 TABLET BY MOUTH AT BEDTIME  . Tamsulosin HCl (FLOMAX) 0.4 MG CAPS Take 0.4 mg by mouth daily after supper.  . traMADol-acetaminophen (ULTRACET) 37.5-325 MG per tablet Take 2 tablets by mouth 3 (three) times daily.      Allergies:   Naproxen, Clopidogrel bisulfate, Sulfonamide derivatives, Other, Latex, and Tape   Social History   Tobacco Use  . Smoking status: Never Smoker  . Smokeless tobacco: Never Used  Substance Use Topics  . Alcohol use: No  . Drug use: No     Family Hx: The patient's family history includes Diverticulosis in his sister; Heart disease in his father; Pancreatic cancer in his mother; Stroke in his father. There is no history of Colon cancer.  ROS:   Please see the history of present illness.     All other systems reviewed and are negative.   Labs/Other Tests and Data Reviewed:    Recent Labs: 04/16/2018: ALT 15; TSH 0.899 04/17/2018: Hemoglobin 13.9; Platelets 164 06/05/2018: BUN 17; Creatinine, Ser 1.56; Potassium 3.9; Sodium 139   Recent Lipid Panel Lab Results    Component Value Date/Time   CHOL  08/09/2009 09:49 AM    164        ATP III CLASSIFICATION:  <200     mg/dL   Desirable  200-239  mg/dL   Borderline High  >=240    mg/dL   High          TRIG 114 08/09/2009 09:49 AM   HDL 36 (L) 08/09/2009 09:49 AM   CHOLHDL 4.6 08/09/2009 09:49 AM   LDLCALC (H) 08/09/2009 09:49 AM    105        Total Cholesterol/HDL:CHD Risk Coronary Heart Disease Risk Table                     Men   Women  1/2 Average Risk   3.4   3.3  Average Risk       5.0   4.4  2 X Average Risk   9.6   7.1  3 X Average Risk  23.4   11.0        Use the calculated Patient Ratio above and the CHD Risk Table to determine the patient's CHD Risk.        ATP III CLASSIFICATION (LDL):  <100     mg/dL   Optimal  100-129  mg/dL   Near or Above                    Optimal  130-159  mg/dL   Borderline  160-189  mg/dL   High  >190     mg/dL   Very High    Wt Readings from Last 3 Encounters:  03/04/19 205 lb (93 kg)  10/15/18 201 lb (91.2 kg)  09/10/18 201 lb (91.2 kg)     Objective:    Vital Signs:  BP (!) 155/80   Ht 5\' 5"  (1.651 m)   Wt 205 lb (93 kg)   BMI 34.11 kg/m     ASSESSMENT & PLAN:    1.  OSA -  The PAP download was  reviewed today and showed an AHI of 7.1/hr on auto PAP cm H2O with 10% compliance in using more than 4 hours nightly.  He recently got a new device because his was not working.  He is using a nasal pillow mask and it seems to have a significant leak. He is using his chin strap but his AHI is elevated likely related to mask leak.  His wife tells me he has been very confused recently and she is taking him to his PCP.  I have recommended that we get an in office appt with his DME to go over his device as well as do a mask fitting to see what is going on with his mask leaking so much.  I encouraged him to be more compliant with his device. I will get a download in 4 weeks  2.  HTN -BP controlled -continue Doxazosin 8mg  daily and amlodipine 5mg   daily  3. Obesity -I have encouraged him to get into a routine exercise program and cut back on carbs and portions.   COVID-19 Education: The signs and symptoms of COVID-19 were discussed with the patient and how to seek care for testing (follow up with PCP or arrange E-visit).  The importance of social distancing was discussed today.  Patient Risk:   After full review of this patient's clinical status, I feel that they are at least moderate risk at this time.  Time:   Today, I have spent 20 minutes directly with the patient on telemedicine discussing medical problems including OSA< HTN, obesity.  We also reviewed the symptoms of COVID 19 and the ways to protect against contracting the virus with telehealth technology.  I spent an additional 5 minutes reviewing patient's chart including PAP compliance download.  Medication Adjustments/Labs and Tests Ordered: Current medicines are reviewed at length with the patient today.  Concerns regarding medicines are outlined above.  Tests Ordered: No orders of the defined types were placed in this encounter.  Medication Changes: No orders of the defined types were placed in this encounter.   Disposition:  Follow up in 1 year(s)  Signed, Fransico Him, MD  03/04/2019 11:24 AM    [facility]

## 2019-03-04 ENCOUNTER — Encounter: Payer: Self-pay | Admitting: Cardiology

## 2019-03-04 ENCOUNTER — Telehealth: Payer: Self-pay | Admitting: *Deleted

## 2019-03-04 ENCOUNTER — Telehealth (INDEPENDENT_AMBULATORY_CARE_PROVIDER_SITE_OTHER): Payer: Medicare PPO | Admitting: Cardiology

## 2019-03-04 ENCOUNTER — Other Ambulatory Visit: Payer: Self-pay

## 2019-03-04 VITALS — BP 155/80 | Ht 65.0 in | Wt 205.0 lb

## 2019-03-04 DIAGNOSIS — E669 Obesity, unspecified: Secondary | ICD-10-CM | POA: Diagnosis not present

## 2019-03-04 DIAGNOSIS — I10 Essential (primary) hypertension: Secondary | ICD-10-CM

## 2019-03-04 DIAGNOSIS — G4733 Obstructive sleep apnea (adult) (pediatric): Secondary | ICD-10-CM

## 2019-03-04 NOTE — Telephone Encounter (Signed)
-----   Message from Sueanne Margarita, MD sent at 03/04/2019 12:11 PM EST ----- Patient  recently got a new device because his was not working.  He is using a nasal pillow mask and it seems to have a significant leak. He is using his chin strap but his AHI is elevated likely related to mask leak.  His wife tells me he has been very confused recently and she is taking him to his PCP.  He was very confused with me over the phone.  I cannot really tell what is going on with his PAP.  Please get him an in person visit with the DME to see why his mask is not fitting and get a download in 4 weeks and followup with me in 1 year

## 2019-03-04 NOTE — Telephone Encounter (Signed)
Reached out to [facility] spoke to Lelon Frohlich the sleep coach who says they have sent the patient a Resmed airfit F-20 FFmask, a Vitera FF mask, and a Wist nasal mask. Lelon Frohlich says they have another option to offer the patient and it is a televisit with the respiratory therapist. I asked if they would please reach out the patient to offer assistance.

## 2019-03-08 ENCOUNTER — Encounter: Payer: Self-pay | Admitting: Nurse Practitioner

## 2019-03-08 ENCOUNTER — Ambulatory Visit: Payer: Medicare PPO | Admitting: Internal Medicine

## 2019-03-15 ENCOUNTER — Ambulatory Visit (INDEPENDENT_AMBULATORY_CARE_PROVIDER_SITE_OTHER): Payer: Medicare PPO | Admitting: Otolaryngology

## 2019-03-18 ENCOUNTER — Emergency Department (HOSPITAL_COMMUNITY)
Admission: EM | Admit: 2019-03-18 | Discharge: 2019-03-18 | Disposition: A | Discharge status: Home / Self Care | Payer: Medicare PPO | Attending: Emergency Medicine | Admitting: Emergency Medicine

## 2019-03-18 ENCOUNTER — Other Ambulatory Visit: Payer: Self-pay

## 2019-03-18 ENCOUNTER — Emergency Department (HOSPITAL_COMMUNITY): Payer: Medicare PPO

## 2019-03-18 DIAGNOSIS — R509 Fever, unspecified: Secondary | ICD-10-CM | POA: Diagnosis not present

## 2019-03-18 DIAGNOSIS — Z950 Presence of cardiac pacemaker: Secondary | ICD-10-CM | POA: Diagnosis not present

## 2019-03-18 DIAGNOSIS — I129 Hypertensive chronic kidney disease with stage 1 through stage 4 chronic kidney disease, or unspecified chronic kidney disease: Secondary | ICD-10-CM | POA: Insufficient documentation

## 2019-03-18 DIAGNOSIS — R1013 Epigastric pain: Secondary | ICD-10-CM | POA: Diagnosis not present

## 2019-03-18 DIAGNOSIS — R519 Headache, unspecified: Secondary | ICD-10-CM | POA: Insufficient documentation

## 2019-03-18 DIAGNOSIS — Z7982 Long term (current) use of aspirin: Secondary | ICD-10-CM | POA: Diagnosis not present

## 2019-03-18 DIAGNOSIS — Z91040 Latex allergy status: Secondary | ICD-10-CM | POA: Insufficient documentation

## 2019-03-18 DIAGNOSIS — R531 Weakness: Secondary | ICD-10-CM | POA: Diagnosis not present

## 2019-03-18 DIAGNOSIS — N183 Chronic kidney disease, stage 3 unspecified: Secondary | ICD-10-CM | POA: Insufficient documentation

## 2019-03-18 DIAGNOSIS — Z20822 Contact with and (suspected) exposure to covid-19: Secondary | ICD-10-CM | POA: Insufficient documentation

## 2019-03-18 DIAGNOSIS — R0602 Shortness of breath: Secondary | ICD-10-CM | POA: Insufficient documentation

## 2019-03-18 DIAGNOSIS — Z79899 Other long term (current) drug therapy: Secondary | ICD-10-CM | POA: Diagnosis not present

## 2019-03-18 DIAGNOSIS — R0789 Other chest pain: Secondary | ICD-10-CM

## 2019-03-18 DIAGNOSIS — I2510 Atherosclerotic heart disease of native coronary artery without angina pectoris: Secondary | ICD-10-CM | POA: Diagnosis not present

## 2019-03-18 DIAGNOSIS — I252 Old myocardial infarction: Secondary | ICD-10-CM | POA: Diagnosis not present

## 2019-03-18 DIAGNOSIS — Z96641 Presence of right artificial hip joint: Secondary | ICD-10-CM | POA: Insufficient documentation

## 2019-03-18 DIAGNOSIS — R079 Chest pain, unspecified: Secondary | ICD-10-CM | POA: Diagnosis not present

## 2019-03-18 LAB — CBC WITH DIFFERENTIAL/PLATELET
Abs Immature Granulocytes: 0.04 10*3/uL (ref 0.00–0.07)
Basophils Absolute: 0 10*3/uL (ref 0.0–0.1)
Basophils Relative: 1 %
Eosinophils Absolute: 0.2 10*3/uL (ref 0.0–0.5)
Eosinophils Relative: 2 %
HCT: 44.4 % (ref 39.0–52.0)
Hemoglobin: 14.4 g/dL (ref 13.0–17.0)
Immature Granulocytes: 1 %
Lymphocytes Relative: 10 %
Lymphs Abs: 0.8 10*3/uL (ref 0.7–4.0)
MCH: 29.9 pg (ref 26.0–34.0)
MCHC: 32.4 g/dL (ref 30.0–36.0)
MCV: 92.3 fL (ref 80.0–100.0)
Monocytes Absolute: 0.4 10*3/uL (ref 0.1–1.0)
Monocytes Relative: 6 %
Neutro Abs: 5.9 10*3/uL (ref 1.7–7.7)
Neutrophils Relative %: 80 %
Platelets: 155 10*3/uL (ref 150–400)
RBC: 4.81 MIL/uL (ref 4.22–5.81)
RDW: 12.5 % (ref 11.5–15.5)
WBC: 7.2 10*3/uL (ref 4.0–10.5)
nRBC: 0 % (ref 0.0–0.2)

## 2019-03-18 LAB — BASIC METABOLIC PANEL
Anion gap: 11 (ref 5–15)
BUN: 17 mg/dL (ref 8–23)
CO2: 26 mmol/L (ref 22–32)
Calcium: 9.5 mg/dL (ref 8.9–10.3)
Chloride: 103 mmol/L (ref 98–111)
Creatinine, Ser: 1.67 mg/dL — ABNORMAL HIGH (ref 0.61–1.24)
GFR calc Af Amer: 43 mL/min — ABNORMAL LOW (ref >60)
GFR calc non Af Amer: 37 mL/min — ABNORMAL LOW (ref >60)
Glucose, Bld: 197 mg/dL — ABNORMAL HIGH (ref 70–99)
Potassium: 4 mmol/L (ref 3.5–5.1)
Sodium: 140 mmol/L (ref 135–145)

## 2019-03-18 LAB — RESPIRATORY PANEL BY RT PCR (FLU A&B, COVID)
Influenza A by PCR: NEGATIVE
Influenza B by PCR: NEGATIVE
SARS Coronavirus 2 by RT PCR: NEGATIVE

## 2019-03-18 LAB — TROPONIN I (HIGH SENSITIVITY)
Troponin I (High Sensitivity): 35 ng/L — ABNORMAL HIGH (ref <18)
Troponin I (High Sensitivity): 31 ng/L — ABNORMAL HIGH (ref <18)

## 2019-03-18 LAB — POC SARS CORONAVIRUS 2 AG -  ED: SARS Coronavirus 2 Ag: NEGATIVE

## 2019-03-18 MED ORDER — ACETAMINOPHEN 325 MG PO TABS
650.0000 mg | ORAL_TABLET | Freq: Once | ORAL | Status: AC
  Administered 2019-03-18: 09:00:00 650 mg via ORAL
  Filled 2019-03-18: qty 2

## 2019-03-18 MED ORDER — ONDANSETRON HCL 4 MG/2ML IJ SOLN: 4.0000 mg | Freq: Once | INTRAMUSCULAR | Status: AC

## 2019-03-18 MED ORDER — ONDANSETRON HCL 4 MG/2ML IJ SOLN
INTRAMUSCULAR | Status: AC
  Administered 2019-03-18: 14:00:00 4 mg via INTRAVENOUS
  Filled 2019-03-18: qty 2

## 2019-03-18 MED ORDER — NITROGLYCERIN 0.4 MG SL SUBL
0.4000 mg | SUBLINGUAL_TABLET | SUBLINGUAL | Status: DC | PRN
  Filled 2019-03-18: qty 1

## 2019-03-18 MED ORDER — GABAPENTIN 400 MG PO CAPS
400.0000 mg | ORAL_CAPSULE | Freq: Once | ORAL | Status: AC
  Administered 2019-03-18: 12:00:00 400 mg via ORAL
  Filled 2019-03-18: qty 1

## 2019-03-18 MED ORDER — TRAMADOL HCL 50 MG PO TABS
75.0000 mg | ORAL_TABLET | Freq: Once | ORAL | Status: AC
  Administered 2019-03-18: 75 mg via ORAL
  Filled 2019-03-18: qty 2

## 2019-03-18 NOTE — ED Triage Notes (Addendum)
Pt here from home for evaluation of generalized chest pain that woke him out of sleep this morning. This is the third time he has called EMS for same this week but has not been transported until this time. Describes pain as a dull ache. Endorses shob x 1 week but he thinks it is related to the nasal congestion he has. Denies n/v. Improvement with ASA and nitro in route. Hx CHF, stemi, htn.

## 2019-03-18 NOTE — Consult Note (Signed)
Cardiology Admission History and Physical:   Patient ID: Gary [name] MRN: [id number]; DOB: Jun 19, 1932   Admission date: 03/18/2019  Primary Care Provider: Minette Brine, FNP Primary Cardiologist: Fransico Him, MD  Primary Electrophysiologist:  None   Chief Complaint:  Chest pain  Patient Profile:   Gary [name] is a 86 y.o. male with PMH of CAD s/p NSTEMI in 2011 with occluded LCx with collaterals (medically managed), CHB s/p medtronic PPM, HTN, HLD, ?PAF in the setting of sepsis, OSA, and CKD stage 3, who presents with chest pain / abdominal pain . Cardiology asked to evaluate by Dr. Regenia Skeeter.   History of Present Illness:   Gary [name] is a poor historian and tangential making history taking quite difficult. He has had several vague symptoms over the past week including intermittent fevers, nasal congestion, and generalized abdominal pain. He reports calling EMS a couple times this week after having episodes of episodes of diaphoresis with arm twitching. When asked about what prompted EMS call this morning he reports waking up with sharp epigastric/abdominal pain, weakness, and diaphoresis. He denies having any chest pain, though EMS run sheet notes dull ache in chest. BP was markedly elevated to 210s/110s on EMS arrival. He received aspirin and SL nitro en route to the ED with reported improvement in symptoms.   He was last evaluated by cardiology during a telemedicine visit with Dr. Radford Pax 03/04/19 for management of his OSA. BP was felt to be stable on doxazosin and amlodipine at that time and there was no reported chest pain. His last echo 04/2018 showed EF 60-65%, G1DD, no RWMA, mild LAE, no significant valvular abnormalities, and mild ascending aortic aneurysm. His last ischemic evaluation was a NST in 2016 which showed fix defect c/w prior MI; low risk study. He had a LHC in 2011 showed total occlusion of LCx with right to left collaterals, medically managed.   At the time of this  evaluation he is chest pain free. He denies any exertional chest pain in recent weeks. He has chronic DOE which is unchanged. He has been struggling with intermittently elevated blood pressures recently, though generally run <160/80s. He reports he has not been taking amlodipine after it was discontinued by his PCP >1 year ago due to possible drug interaction - medication list reviewed and I do not see any contraindication to using amlodipine. He reports feeling a bit constipated recently and ?black stools on a few occasions over the past week. While in the room with him he took a sip of water and had an episode of sudden onset nausea and coughing. He denies coughing at home, though reports increased phlegm. No complaints of palpitations, BRBPR, or hematuria.   ED course: afebrile, initially mildly tachycardic/tachypneic - both improve, intermittently hypertensive, satting in the mid 90s on RA. Labs notable for electrolytes wnl, glucose 197, Cr 1.67, CBC wnl, trop 35, RVP and COVID-19 negative. CXR without acute findings. EKG initially with V-paced rhythm, rate 105. Repeat EKG with sinus rhythm with rate 84 bpm, incomplete RBBB, non-specific ST-T wave abnormalities similar to 2020.   Heart Pathway Score:  HEAR Score: 6  Past Medical History:  Diagnosis Date  . Adenomatous colon polyp    2010   . Anemia   . Anxiety   . Arthritis    "hips and lower back"  . Arthritis pain   . Blood transfusion   . Chest pain 03/29/11   2D Echo without contrast on 03/29/11 - EF= 55-60%.  . Cholecystitis   .  Chronic back pain greater than 3 months duration   . Chronic kidney disease   . Colon polyps   . Complication of anesthesia    once slow to wake up  . Coronary angioplasty status 2011  . Coronary artery disease   . Diarrhea   . Diverticulosis   . Dyslipidemia   . GERD (gastroesophageal reflux disease)   . GI bleeding after 07/2009   "triggered by Plavix & ASA; from my diverticulosis"  . H/O Clostridium  difficile infection   . H/O myocardial perfusion scan 04/04/11   For Pre-noncardiac surgery - EF = 67%, no ischemia, and is considered low risk.  Marland Kitchen Heart attack ([place]) 07/2009   nonSTEMI with an occluded circumflex artery and collaterals.  Marland Kitchen Heart murmur   . Hemorrhoids   . History of shingles   . Hyperlipidemia   . Hypertension   . Kidney stones   . Lower extremity edema    Taking furosemide and it seems to be helping.  . Neuromuscular disorder ([place])   . Nonspecific chest pain 08/26/2012   Went to ED 08/26/12  . Stomach problems   . Syncope and collapse 04/16/2018    Past Surgical History:  Procedure Laterality Date  . BACK SURGERY  1997  . CARDIAC CATHETERIZATION  08/09/2009   Circumflex 100% occluded with right-to-left collaterals, mild irregularities in the proximal and distal LAD and diagonal system, normal LV systolic function, and minor infrarenal irregularities, but no abdominal aortic aneurysm.  . CHOLECYSTECTOMY  04/12/2011   Procedure: CHOLECYSTECTOMY;  Surgeon: Adin Hector, MD;  Location: [place];  Service: General;  Laterality: N/A;  . CHOLECYSTECTOMY  04/12/2011   Procedure: LAPAROSCOPIC CHOLECYSTECTOMY;  Surgeon: Adin Hector, MD;  Location: [place];  Service: General;  Laterality: N/A;  converted to open  . COLON SURGERY     COLONOSCOPY  . ESOPHAGOGASTRODUODENOSCOPY N/A 08/05/2013   Procedure: ESOPHAGOGASTRODUODENOSCOPY (EGD);  Surgeon: Jerene Bears, MD;  Location: [place];  Service: Gastroenterology;  Laterality: N/A;  . EYE SURGERY  1942   "right eye was crossed; they  corrected it"  . LUMBAR FUSION  07/22/2012  . PACEMAKER IMPLANT N/A 04/17/2018   Procedure: PACEMAKER IMPLANT;  Surgeon: Deboraha Sprang, MD;  Location: [facility];  Service: Cardiovascular;  Laterality: N/A;  . POSTERIOR FUSION LUMBAR SPINE  08/1995   "bottom 4 vertebra"  . SUBTOTAL COLECTOMY  01/24/2010  . TOTAL HIP ARTHROPLASTY Right 08/03/2013   Procedure: TOTAL HIP ARTHROPLASTY  ANTERIOR APPROACH;  Surgeon: Hessie Dibble, MD;  Location: [place];  Service: Orthopedics;  Laterality: Right;     Medications Prior to Admission: Prior to Admission medications   Medication Sig Start Date End Date Taking? Authorizing Provider  acetaminophen (TYLENOL) 500 MG tablet Take 1,000 mg by mouth every 8 (eight) hours as needed for fever.   Yes [provider]  amLODipine (NORVASC) 5 MG tablet Take 1 tablet (5 mg total) by mouth daily. 10/30/18 03/18/19 Yes HiltyNadean Corwin, MD  aspirin EC 81 MG tablet Take 81 mg by mouth daily.   Yes [provider]  citalopram (CELEXA) 40 MG tablet Take 40 mg by mouth daily. 05/12/18  Yes [provider]  doxazosin (CARDURA) 4 MG tablet Take 4 mg by mouth daily.    Yes [provider]  fluticasone (FLONASE) 50 MCG/ACT nasal spray Place 2 sprays into both nostrils daily.    Yes [provider]  furosemide (LASIX) 20 MG tablet Take 1  tablet (20 mg total) by mouth every other day. 10/07/18  Yes Hilty, Nadean Corwin, MD  gabapentin (NEURONTIN) 400 MG capsule Take 400 mg by mouth 3 (three) times daily.    Yes [provider]  nitroGLYCERIN (NITROSTAT) 0.4 MG SL tablet PLACE 1 TABLET UNDER THE TONGUE EVERY 5 MINUTES AS NEEDED FOR CHEST PAIN FOR 3 DOSES Patient taking differently: Place 0.4 mg under the tongue every 5 (five) minutes as needed for chest pain.  10/25/16  Yes Hilty, Nadean Corwin, MD  simvastatin (ZOCOR) 40 MG tablet TAKE 1 TABLET BY MOUTH AT BEDTIME Patient taking differently: Take 40 mg by mouth at bedtime.  03/18/16  Yes Hilty, Nadean Corwin, MD  Tamsulosin HCl (FLOMAX) 0.4 MG CAPS Take 0.4 mg by mouth daily after supper. 02/04/11  Yes Charlynne Cousins, MD  traMADol-acetaminophen (ULTRACET) 37.5-325 MG per tablet Take 2 tablets by mouth 3 (three) times daily.  09/23/12  Yes [provider]  AMBULATORY NON FORMULARY MEDICATION Place 2 mLs rectally 3 (three) times daily. Medication Name: 5 %  lidocaine ointment mixed with 0.125 nitroglycerin ointment Patient not taking: Reported on 03/18/2019 06/24/16   Alfredia Ferguson, PA-C     Allergies:    Allergies  Allergen Reactions  . Naproxen Rash  . Clopidogrel Bisulfate Other (See Comments)    H/O diverticulitis; prone to rectal bleeding.  Plavix complicated this.  jkl  . Sulfonamide Derivatives Hives  . Other Other (See Comments)    Narcotics-C. Diff  . Latex Rash  . Tape Itching and Rash    Latex-Adhesive tape    Social History:   Social History   Socioeconomic History  . Marital status: Married    Spouse name: Not on file  . Number of children: 2  . Years of education: Not on file  . Highest education level: Not on file  Occupational History  . Occupation: Art therapist  Tobacco Use  . Smoking status: Never Smoker  . Smokeless tobacco: Never Used  Substance and Sexual Activity  . Alcohol use: No  . Drug use: No  . Sexual activity: Not Currently  Other Topics Concern  . Not on file  Social History Narrative   Daily caffeine use.   Social Determinants of Health   Financial Resource Strain:   . Difficulty of Paying Living Expenses: Not on file  Food Insecurity:   . Worried About Charity fundraiser in the Last Year: Not on file  . Ran Out of Food in the Last Year: Not on file  Transportation Needs:   . Lack of Transportation (Medical): Not on file  . Lack of Transportation (Non-Medical): Not on file  Physical Activity:   . Days of Exercise per Week: Not on file  . Minutes of Exercise per Session: Not on file  Stress:   . Feeling of Stress : Not on file  Social Connections:   . Frequency of Communication with Friends and Family: Not on file  . Frequency of Social Gatherings with Friends and Family: Not on file  . Attends Religious Services: Not on file  . Active Member of Clubs or Organizations: Not on file  . Attends Archivist Meetings: Not on file  . Marital Status: Not on file  Intimate  Partner Violence:   . Fear of Current or Ex-Partner: Not on file  . Emotionally Abused: Not on file  . Physically Abused: Not on file  . Sexually Abused: Not on file    Family History:  The patient's family history includes Diverticulosis in his sister; Heart disease in his father; Pancreatic cancer in his mother; Stroke in his father. There is no history of Colon cancer.    ROS:  Please see the history of present illness.  All other ROS reviewed and negative.     Physical Exam/Data:   Vitals:   03/18/19 0945 03/18/19 1000 03/18/19 1015 03/18/19 1030  BP: (!) 144/81 140/84 (!) 155/86 (!) 151/76  Pulse: 85 81 80 85  Resp: 18 19 14 17   Temp:      TempSrc:      SpO2: 94% 94% 95% 95%   No intake or output data in the 24 hours ending 03/18/19 1130 Last 3 Weights 03/04/2019 10/15/2018 09/10/2018  Weight (lbs) 205 lb 201 lb 201 lb  Weight (kg) 92.987 kg 91.173 kg 91.173 kg     There is no height or weight on file to calculate BMI.  General:  Well nourished, well developed, in no acute distress HEENT: sclera anicteric Neck: no JVD Vascular: No carotid bruits; distal pulses 2+ bilaterally Cardiac:  normal S1, S2; RRR; no murmurs, rubs, or gallops Lungs:  clear to auscultation bilaterally, no wheezing, rhonchi or rales  Abd: soft, nontender, no hepatomegaly  Ext: no edema Musculoskeletal:  No deformities, BUE and BLE strength normal and equal Skin: warm and dry  Neuro:  CNs 2-12 intact, no focal abnormalities noted Psych:  Normal affect    EKG:  EKG initially with V-paced rhythm, rate 105. Repeat EKG with sinus rhythm with rate 84 bpm, incomplete RBBB, non-specific ST-T wave abnormalities similar to 2020.    Relevant CV Studies: Echocardiogram 04/2018: 1. The left ventricle has normal systolic function with an ejection  fraction of 60-65%. The cavity size was normal. Left ventricular diastolic  Doppler parameters are consistent with impaired relaxation. No evidence of  left  ventricular regional wall  motion abnormalities.  2. The right ventricle has normal systolic function. The cavity was  normal. There is no increase in right ventricular wall thickness.  3. Left atrial size was mildly dilated.  4. There is moderate mitral annular calcification present. No evidence of  mitral valve stenosis. No significant mitral regurgitation.  5. The aortic valve is tricuspid Moderate calcification of the aortic  valve. no stenosis of the aortic valve.  6. The aortic root is normal in size and structure.  7. There is mild dilatation of the ascending aorta measuring 37 mm.  8. The IVC was not visualized. No complete TR doppler jet so unable to  estimate PA systolic pressure.   NST 2016:  The left ventricular ejection fraction is normal (55-65%).  Nuclear stress EF: 55%.  There was no ST segment deviation noted during stress.  This is a low risk study.  Findings consistent with ischemia and prior myocardial infarction.   Low risk stress nuclear study with a small, severe, partially reversible lateral defect consistent with small prior infarct and very mild peri-infarct ischemia; EF 55 with hypokinesis of the high lateral wall.   Laboratory Data:  High Sensitivity Troponin:   Recent Labs  Lab 03/18/19 0906  TROPONINIHS 35*      Chemistry Recent Labs  Lab 03/18/19 0906  NA 140  K 4.0  CL 103  CO2 26  GLUCOSE 197*  BUN 17  CREATININE 1.67*  CALCIUM 9.5  GFRNONAA 37*  GFRAA 43*  ANIONGAP 11    No results for input(s): PROT, ALBUMIN, AST, ALT, ALKPHOS, BILITOT in the last  168 hours. Hematology Recent Labs  Lab 03/18/19 0906  WBC 7.2  RBC 4.81  HGB 14.4  HCT 44.4  MCV 92.3  MCH 29.9  MCHC 32.4  RDW 12.5  PLT 155   BNPNo results for input(s): BNP, PROBNP in the last 168 hours.  DDimer No results for input(s): DDIMER in the last 168 hours.   Radiology/Studies:  DG Chest Portable 1 View  Result Date: 03/18/2019 CLINICAL DATA:   Chest pain, shortness of breath. EXAM: PORTABLE CHEST 1 VIEW COMPARISON:  April 18, 2018. FINDINGS: Stable cardiomegaly. Left-sided pacemaker is unchanged in position. No pneumothorax or pleural effusion is noted. Hypoinflation of the lungs is noted. Lungs are clear. Bony thorax is unremarkable. IMPRESSION: No active disease. Aortic Atherosclerosis (ICD10-I70.0). Electronically Signed   By: Marijo Conception M.D.   On: 03/18/2019 09:16    Assessment and Plan:   1. ?Chest pain in patient with CAD: patient activated EMS this morning due to ?dull chest pain, weakness, and diaphoresis. EKG initially with V pacing, then NSR with non-specific ST-T wave abnormalities unchanged from previous. Trops 35>31. CXR without acute findings. BP was markedly elevated on EMS arrival to 210s/110s. Possible hypertensive urgency was the cause of his chest discomfort. BP has improved in the ED and no recurrent chest pain. Presentation is not consistent with ACS. - No further ischemic work-up at this time - Favor aggressive BP control below  2. HTN: BP generally above goal outpatient. He reports he has not been on amlodipine for quite some time as his PCP stopped this medication due to possible drug interaction (not seen after review of home meds).  - Will restart amlodipine 5mg  daily - Continue home doxazosin and lasix  3. Vague abdominal pain/nausea: unclear etiology. He had an episode of nausea/retching while I was in the room with him. Improved with zofran.  - Can follow-up outpatient with PCP.  4. Fever/nasal congestion: likely some underlying viral illness recently as he reported fevers 03/08/19 with nasal congestion and increased phlegm. CXR clear and no leukocytosis on labs. RVP and COVID-19 negative. Unclear etiology - Can follow-up outpatient with PCP.      For questions or updates, please contact [place] Please consult [email or web address] for contact info under     Signed, Abigail Butts, PA-C    03/18/2019 11:30 AM    Attending Note:   The patient was seen and examined.  Agree with assessment and plan as noted above.  Changes made to the above note as needed.  Patient seen and independently examined with  Roby Lofts, PA .   We discussed all aspects of the encounter. I agree with the assessment and plan as stated above.  1.  Abdominal pain:;    Gary [name] presents for further evaluation of what he thought was abdominal pain or chest pain.  When I asked him to point to where he was hurting he definitely pointed to his mid abdomen.  EKG shows a V - paced rhythm.  Troponin levels are only minimally elevated and the trend is flat and slightly downtrending.  He is not having any episodes of angina.  He was hypertensive when he came to the emergency room but now his blood pressure is normal. At this time I do not think that he is having any unstable angina and I think that he can be safely discharged to home from a cardiac standpoint. His blood pressure is normal right now but I would have a low threshold to add  amlodipine back if he has persistent hypertension. Consider adding Imdur as OP if he has any recurrent   We will have him return to see Dr. Debara Pickett   I have spent a total of 40 minutes with patient reviewing hospital  notes , telemetry, EKGs, labs and examining patient as well as establishing an assessment and plan that was discussed with the patient. > 50% of time was spent in direct patient care.    Thayer Headings, Brooke Bonito., MD, [facility] 03/18/2019, 3:13 PM [address]  [place] Pager [phone]

## 2019-03-18 NOTE — ED Provider Notes (Signed)
[facility] EMERGENCY DEPARTMENT Provider Note   CSN: [id number] Arrival date & time: 03/18/19  0827     History Chief Complaint  Patient presents with  . Chest Pain    Gary [name] is a 86 y.o. male.  HPI 86 year old male presents with arm tightness.  Overall the patient is a poor historian and has some hearing difficulty which limits the history.  Overall he states he started feeling poorly this morning around 6 AM.  Was awoken with the symptoms.  Is having no chest pain currently though he reportedly told EMS he was having dull chest pain.  Some shortness of breath.  His arms feel tight and weak bilaterally which he states is somewhat similar to when he had an MI.  Some cough over the past several weeks.  No leg swelling.  He has developed a little headache since given nitroglycerin and aspirin by EMS.  Tightness in his arms also improved with nitroglycerin. Reports at 100.2 temp at home.   Past Medical History:  Diagnosis Date  . Adenomatous colon polyp    2010   . Anemia   . Anxiety   . Arthritis    "hips and lower back"  . Arthritis pain   . Blood transfusion   . Chest pain 03/29/11   2D Echo without contrast on 03/29/11 - EF= 55-60%.  . Cholecystitis   . Chronic back pain greater than 3 months duration   . Chronic kidney disease   . Colon polyps   . Complication of anesthesia    once slow to wake up  . Coronary angioplasty status 2011  . Coronary artery disease   . Diarrhea   . Diverticulosis   . Dyslipidemia   . GERD (gastroesophageal reflux disease)   . GI bleeding after 07/2009   "triggered by Plavix & ASA; from my diverticulosis"  . H/O Clostridium difficile infection   . H/O myocardial perfusion scan 04/04/11   For Pre-noncardiac surgery - EF = 67%, no ischemia, and is considered low risk.  Marland Kitchen Heart attack ([place]) 07/2009   nonSTEMI with an occluded circumflex artery and collaterals.  Marland Kitchen Heart murmur   . Hemorrhoids   . History of shingles     . Hyperlipidemia   . Hypertension   . Kidney stones   . Lower extremity edema    Taking furosemide and it seems to be helping.  . Neuromuscular disorder ([place])   . Nonspecific chest pain 08/26/2012   Went to ED 08/26/12  . Stomach problems   . Syncope and collapse 04/16/2018    Patient Active Problem List   Diagnosis Date Noted  . Complete heart block ([place]) 04/17/2018  . Syncope and collapse 04/16/2018  . Sinus pause 04/16/2018  . Snoring 10/03/2017  . Other fatigue 10/03/2017  . Pre-syncope 05/14/2016  . Murmur 05/14/2016  . CAD in native artery 10/31/2015  . History of MI (myocardial infarction) 10/31/2015  . Preoperative cardiovascular examination 12/26/2014  . History of colonic polyps 01/17/2014  . PAF (paroxysmal atrial fibrillation) ([place]) 08/14/2013  . Metabolic acidosis 53/97/6734  . Acute renal failure ([place]) 08/13/2013  . Chronic kidney disease, stage 3 08/13/2013  . Anemia in chronic kidney disease 08/13/2013  . Hypokalemia 08/12/2013  . Acute blood loss anemia 08/09/2013  . Encephalopathy 08/09/2013  . Severe sepsis ([place]) 08/08/2013  . Hypotension, unspecified 08/07/2013  . Complex renal cyst 08/07/2013  . Renal mass, left 08/06/2013  . Upper GI bleeding 08/05/2013  . Acute  esophagitis 08/05/2013  . Urinary retention 08/04/2013  . S/P total hip arthroplasty 08/03/2013  . Degenerative joint disease (DJD) of hip 08/03/2013    Class: Chronic  . Internal hemorrhoids with other complication 85/27/7824  . Tinnitus 10/22/2012  . C. difficile colitis 07/26/2012  . Ileus, postoperative ([place]) 07/24/2012  . Somnolence 07/24/2012  . Anxiety state 07/24/2012  . Chest pain- ER 08/26/12 03/30/2011  . Fever 03/28/2011  . Leucocytosis 03/28/2011  . CKD (chronic kidney disease), stage III ([place]) 03/28/2011  . Hyponatremia 02/02/2011  . BPH (benign prostatic hyperplasia) 02/01/2011  . Cholecystitis chronic, acute 01/26/2011  . Dyslipidemia 01/22/2011  . Abdominal  bloating 08/07/2010  . Loose stools 08/07/2010  . Coronary atherosclerosis 10/26/2009  . Essential hypertension 06/07/2009  . DIVERTICULOSIS OF COLON 10/31/2008  . DIVERTICULOSIS, COLON, WITH HEMORRHAGE, surg 12/11  10/31/2008  . Personal history of colonic polyps 10/31/2008    Past Surgical History:  Procedure Laterality Date  . BACK SURGERY  1997  . CARDIAC CATHETERIZATION  08/09/2009   Circumflex 100% occluded with right-to-left collaterals, mild irregularities in the proximal and distal LAD and diagonal system, normal LV systolic function, and minor infrarenal irregularities, but no abdominal aortic aneurysm.  . CHOLECYSTECTOMY  04/12/2011   Procedure: CHOLECYSTECTOMY;  Surgeon: Adin Hector, MD;  Location: [facility];  Service: General;  Laterality: N/A;  . CHOLECYSTECTOMY  04/12/2011   Procedure: LAPAROSCOPIC CHOLECYSTECTOMY;  Surgeon: Adin Hector, MD;  Location: [facility];  Service: General;  Laterality: N/A;  converted to open  . COLON SURGERY     COLONOSCOPY  . ESOPHAGOGASTRODUODENOSCOPY N/A 08/05/2013   Procedure: ESOPHAGOGASTRODUODENOSCOPY (EGD);  Surgeon: Jerene Bears, MD;  Location: [facility];  Service: Gastroenterology;  Laterality: N/A;  . EYE SURGERY  1942   "right eye was crossed; they  corrected it"  . LUMBAR FUSION  07/22/2012  . PACEMAKER IMPLANT N/A 04/17/2018   Procedure: PACEMAKER IMPLANT;  Surgeon: Deboraha Sprang, MD;  Location: [facility];  Service: Cardiovascular;  Laterality: N/A;  . POSTERIOR FUSION LUMBAR SPINE  08/1995   "bottom 4 vertebra"  . SUBTOTAL COLECTOMY  01/24/2010  . TOTAL HIP ARTHROPLASTY Right 08/03/2013   Procedure: TOTAL HIP ARTHROPLASTY ANTERIOR APPROACH;  Surgeon: Hessie Dibble, MD;  Location: [facility];  Service: Orthopedics;  Laterality: Right;       Family History  Problem Relation Age of Onset  . Heart disease Father   . Stroke Father   . Pancreatic cancer Mother   . Diverticulosis Sister        x4  . Colon cancer Neg Hx      Social History   Tobacco Use  . Smoking status: Never Smoker  . Smokeless tobacco: Never Used  Substance Use Topics  . Alcohol use: No  . Drug use: No    Home Medications Prior to Admission medications   Medication Sig Start Date End Date Taking? Authorizing Provider  acetaminophen (TYLENOL) 500 MG tablet Take 1,000 mg by mouth every 8 (eight) hours as needed for fever.   Yes [provider]  amLODipine (NORVASC) 5 MG tablet Take 1 tablet (5 mg total) by mouth daily. 10/30/18 03/18/19 Yes HiltyNadean Corwin, MD  aspirin EC 81 MG tablet Take 81 mg by mouth daily.   Yes [provider]  citalopram (CELEXA) 40 MG tablet Take 40 mg by mouth daily. 05/12/18  Yes [provider]  doxazosin (CARDURA) 4 MG tablet Take 4 mg by mouth daily.  Yes [provider]  fluticasone (FLONASE) 50 MCG/ACT nasal spray Place 2 sprays into both nostrils daily.    Yes [provider]  furosemide (LASIX) 20 MG tablet Take 1 tablet (20 mg total) by mouth every other day. 10/07/18  Yes Hilty, Nadean Corwin, MD  gabapentin (NEURONTIN) 400 MG capsule Take 400 mg by mouth 3 (three) times daily.    Yes [provider]  nitroGLYCERIN (NITROSTAT) 0.4 MG SL tablet PLACE 1 TABLET UNDER THE TONGUE EVERY 5 MINUTES AS NEEDED FOR CHEST PAIN FOR 3 DOSES Patient taking differently: Place 0.4 mg under the tongue every 5 (five) minutes as needed for chest pain.  10/25/16  Yes Hilty, Nadean Corwin, MD  simvastatin (ZOCOR) 40 MG tablet TAKE 1 TABLET BY MOUTH AT BEDTIME Patient taking differently: Take 40 mg by mouth at bedtime.  03/18/16  Yes Hilty, Nadean Corwin, MD  Tamsulosin HCl (FLOMAX) 0.4 MG CAPS Take 0.4 mg by mouth daily after supper. 02/04/11  Yes Charlynne Cousins, MD  traMADol-acetaminophen (ULTRACET) 37.5-325 MG per tablet Take 2 tablets by mouth 3 (three) times daily.  09/23/12  Yes [provider]  AMBULATORY NON FORMULARY MEDICATION Place 2 mLs rectally 3 (three) times  daily. Medication Name: 5 % lidocaine ointment mixed with 0.125 nitroglycerin ointment Patient not taking: Reported on 03/18/2019 06/24/16   Ester[place], Amy S, PA-C    Allergies    Naproxen, Clopidogrel bisulfate, Sulfonamide derivatives, Other, Latex, and Tape  Review of Systems   Review of Systems  Constitutional: Positive for fever.  Respiratory: Positive for cough, chest tightness and shortness of breath.   Musculoskeletal: Positive for myalgias.  Neurological: Positive for weakness.  All other systems reviewed and are negative.   Physical Exam Updated Vital Signs BP (!) 146/95   Pulse 83   Temp 98.8 F (37.1 C) (Oral)   Resp 16   SpO2 90%   Physical Exam Vitals and nursing note reviewed.  Constitutional:      Appearance: He is well-developed.  HENT:     Head: Normocephalic and atraumatic.     Right Ear: External ear normal.     Left Ear: External ear normal.     Nose: Nose normal.  Eyes:     General:        Right eye: No discharge.        Left eye: No discharge.  Cardiovascular:     Rate and Rhythm: Regular rhythm. Tachycardia present.     Pulses:          Radial pulses are 2+ on the right side and 2+ on the left side.     Heart sounds: Normal heart sounds.     Comments: HR low 100s Pulmonary:     Effort: Pulmonary effort is normal.     Breath sounds: Normal breath sounds.  Abdominal:     Palpations: Abdomen is soft.     Tenderness: There is no abdominal tenderness.  Musculoskeletal:     Cervical back: Neck supple.     Right lower leg: No edema.     Left lower leg: No edema.  Skin:    General: Skin is warm and dry.  Neurological:     Mental Status: He is alert.     Comments: 5/5 strength in all 4 extremities. Grossly normal sensation.   Psychiatric:        Mood and Affect: Mood is not anxious.     ED Results / Procedures / Treatments   Labs (  all labs ordered are listed, but only abnormal results are displayed) Labs Reviewed  BASIC METABOLIC PANEL -  Abnormal; Notable for the following components:      Result Value   Glucose, Bld 197 (*)    Creatinine, Ser 1.67 (*)    GFR calc non Af Amer 37 (*)    GFR calc Af Amer 43 (*)    All other components within normal limits  TROPONIN I (HIGH SENSITIVITY) - Abnormal; Notable for the following components:   Troponin I (High Sensitivity) 35 (*)    All other components within normal limits  TROPONIN I (HIGH SENSITIVITY) - Abnormal; Notable for the following components:   Troponin I (High Sensitivity) 31 (*)    All other components within normal limits  RESPIRATORY PANEL BY RT PCR (FLU A&B, COVID)  CBC WITH DIFFERENTIAL/PLATELET  POC SARS CORONAVIRUS 2 AG -  ED    EKG EKG Interpretation  Date/Time:  Thursday March 18 2019 08:37:47 EST Ventricular Rate:  105 PR Interval:    QRS Duration: 172 QT Interval:  436 QTC Calculation: 577 R Axis:   -76 Text Interpretation: Left bundle branch block LVH with IVCD, LAD and secondary repol abnrm Prolonged QT interval Confirmed by Sherwood Gambler [phone]) on 03/18/2019 8:54:01 AM   EKG Interpretation  Date/Time:  Thursday March 18 2019 10:14:29 EST Ventricular Rate:  84 PR Interval:    QRS Duration: 118 QT Interval:  384 QTC Calculation: 454 R Axis:   16 Text Interpretation: Sinus rhythm Incomplete right bundle branch block LBBB no longer present. St/T changes similar to 2020 Confirmed by Sherwood Gambler [phone]) on 03/18/2019 10:19:22 AM        EKG Interpretation  Date/Time:  Thursday March 18 2019 10:36:18 EST Ventricular Rate:  82 PR Interval:    QRS Duration: 120 QT Interval:  393 QTC Calculation: 459 R Axis:   13 Text Interpretation: Sinus rhythm IVCD, consider atypical RBBB no significant change since earlier in the day Confirmed by Sherwood Gambler [phone]) on 03/18/2019 10:38:38 AM       Radiology DG Chest Portable 1 View  Result Date: 03/18/2019 CLINICAL DATA:  Chest pain, shortness of breath. EXAM: PORTABLE CHEST 1 VIEW  COMPARISON:  April 18, 2018. FINDINGS: Stable cardiomegaly. Left-sided pacemaker is unchanged in position. No pneumothorax or pleural effusion is noted. Hypoinflation of the lungs is noted. Lungs are clear. Bony thorax is unremarkable. IMPRESSION: No active disease. Aortic Atherosclerosis (ICD10-I70.0). Electronically Signed   By: Marijo Conception M.D.   On: 03/18/2019 09:16    Procedures Procedures (including critical care time)  Medications Ordered in ED Medications  nitroGLYCERIN (NITROSTAT) SL tablet 0.4 mg (has no administration in time range)  acetaminophen (TYLENOL) tablet 650 mg (650 mg Oral Given 03/18/19 0858)  traMADol (ULTRAM) tablet 75 mg (75 mg Oral Given 03/18/19 1157)  gabapentin (NEURONTIN) capsule 400 mg (400 mg Oral Given 03/18/19 1155)  ondansetron (ZOFRAN) injection 4 mg (4 mg Intravenous Given 03/18/19 1349)    ED Course  I have reviewed the triage vital signs and the nursing notes.  Pertinent labs & imaging results that were available during my care of the patient were reviewed by me and considered in my medical decision making (see chart for details).    MDM Rules/Calculators/A&P Gary [name] Score: 6                    Patient is very nonspecific in his complaints.  His troponins are flat.  Not consistent  with MI.  He did have one episode of chest pain but also with some epigastric burning.  All of this is resolved.  Cardiology has been consulted and seen patient and feels he is stable for outpatient management with his cardiologist.  I think this is reasonable.  Covid testing is negative.  My suspicion for PE or dissection is extremely low.  Discharged home with return precautions.  LE FAULCON was evaluated in Emergency Department on 03/18/2019 for the symptoms described in the history of present illness. He was evaluated in the context of the global COVID-19 pandemic, which necessitated consideration that the patient might be at risk for infection with the SARS-CoV-2 virus that  causes COVID-19. Institutional protocols and algorithms that pertain to the evaluation of patients at risk for COVID-19 are in a state of rapid change based on information released by regulatory bodies including the CDC and federal and state organizations. These policies and algorithms were followed during the patient's care in the ED.  Final Clinical Impression(s) / ED Diagnoses Final diagnoses:  Nonspecific chest pain    Rx / DC Orders ED Discharge Orders    None       Sherwood Gambler, MD 03/18/19 1553

## 2019-03-18 NOTE — Discharge Instructions (Addendum)
If you develop recurrent, continued, or worsening chest pain, shortness of breath, fever, vomiting, abdominal or back pain, or any other new/concerning symptoms then return to the ER for evaluation.  

## 2019-03-19 ENCOUNTER — Encounter: Payer: Self-pay | Admitting: Nurse Practitioner

## 2019-03-19 NOTE — Telephone Encounter (Signed)
Would restart amlodipine until seen back by [place].  Dr Lemmie Evens

## 2019-03-24 ENCOUNTER — Telehealth: Payer: Self-pay

## 2019-03-24 ENCOUNTER — Ambulatory Visit: Payer: Medicare PPO | Admitting: Nurse Practitioner

## 2019-03-24 ENCOUNTER — Encounter: Payer: Self-pay | Admitting: Nurse Practitioner

## 2019-03-24 NOTE — Telephone Encounter (Signed)
Called pt to encourage him to come to appt to establish care. Pt stated that he just couldn't come and that hes 86 years old and hes been through it all. Spoke with wife to encourage her to bring him. Provider spoke with wife and explained that it was nothing we could do to help if he didn't establish care. Wife given numbers to house call providers to see if they could get someone to come to their home to care for him    [phone] house call dr [phone] making house calls

## 2019-03-26 ENCOUNTER — Encounter: Payer: Self-pay | Admitting: Nurse Practitioner

## 2019-03-27 ENCOUNTER — Other Ambulatory Visit: Payer: Self-pay

## 2019-03-27 ENCOUNTER — Emergency Department (HOSPITAL_COMMUNITY): Payer: Medicare PPO

## 2019-03-27 ENCOUNTER — Encounter (HOSPITAL_COMMUNITY): Payer: Self-pay

## 2019-03-27 ENCOUNTER — Observation Stay (HOSPITAL_COMMUNITY)
Admission: EM | Admit: 2019-03-27 | Discharge: 2019-03-29 | Disposition: A | Discharge status: Home / Self Care | Payer: Medicare PPO | Attending: Internal Medicine | Admitting: Internal Medicine

## 2019-03-27 DIAGNOSIS — Z888 Allergy status to other drugs, medicaments and biological substances status: Secondary | ICD-10-CM | POA: Insufficient documentation

## 2019-03-27 DIAGNOSIS — R0789 Other chest pain: Principal | ICD-10-CM | POA: Insufficient documentation

## 2019-03-27 DIAGNOSIS — Z8249 Family history of ischemic heart disease and other diseases of the circulatory system: Secondary | ICD-10-CM | POA: Diagnosis not present

## 2019-03-27 DIAGNOSIS — N183 Chronic kidney disease, stage 3 unspecified: Secondary | ICD-10-CM | POA: Insufficient documentation

## 2019-03-27 DIAGNOSIS — R739 Hyperglycemia, unspecified: Secondary | ICD-10-CM

## 2019-03-27 DIAGNOSIS — I129 Hypertensive chronic kidney disease with stage 1 through stage 4 chronic kidney disease, or unspecified chronic kidney disease: Secondary | ICD-10-CM | POA: Insufficient documentation

## 2019-03-27 DIAGNOSIS — Z96641 Presence of right artificial hip joint: Secondary | ICD-10-CM | POA: Diagnosis not present

## 2019-03-27 DIAGNOSIS — Z7982 Long term (current) use of aspirin: Secondary | ICD-10-CM | POA: Insufficient documentation

## 2019-03-27 DIAGNOSIS — I2510 Atherosclerotic heart disease of native coronary artery without angina pectoris: Secondary | ICD-10-CM | POA: Diagnosis not present

## 2019-03-27 DIAGNOSIS — Z885 Allergy status to narcotic agent status: Secondary | ICD-10-CM | POA: Diagnosis not present

## 2019-03-27 DIAGNOSIS — R079 Chest pain, unspecified: Secondary | ICD-10-CM | POA: Diagnosis not present

## 2019-03-27 DIAGNOSIS — E669 Obesity, unspecified: Secondary | ICD-10-CM | POA: Diagnosis not present

## 2019-03-27 DIAGNOSIS — E1122 Type 2 diabetes mellitus with diabetic chronic kidney disease: Secondary | ICD-10-CM | POA: Diagnosis not present

## 2019-03-27 DIAGNOSIS — E1165 Type 2 diabetes mellitus with hyperglycemia: Secondary | ICD-10-CM | POA: Insufficient documentation

## 2019-03-27 DIAGNOSIS — E785 Hyperlipidemia, unspecified: Secondary | ICD-10-CM | POA: Diagnosis not present

## 2019-03-27 DIAGNOSIS — Z20822 Contact with and (suspected) exposure to covid-19: Secondary | ICD-10-CM | POA: Insufficient documentation

## 2019-03-27 DIAGNOSIS — I6782 Cerebral ischemia: Secondary | ICD-10-CM | POA: Insufficient documentation

## 2019-03-27 DIAGNOSIS — Z23 Encounter for immunization: Secondary | ICD-10-CM | POA: Insufficient documentation

## 2019-03-27 DIAGNOSIS — M1990 Unspecified osteoarthritis, unspecified site: Secondary | ICD-10-CM | POA: Insufficient documentation

## 2019-03-27 DIAGNOSIS — G4733 Obstructive sleep apnea (adult) (pediatric): Secondary | ICD-10-CM | POA: Diagnosis not present

## 2019-03-27 DIAGNOSIS — N1832 Chronic kidney disease, stage 3b: Secondary | ICD-10-CM | POA: Diagnosis present

## 2019-03-27 DIAGNOSIS — Z79899 Other long term (current) drug therapy: Secondary | ICD-10-CM | POA: Diagnosis not present

## 2019-03-27 DIAGNOSIS — M6281 Muscle weakness (generalized): Secondary | ICD-10-CM | POA: Diagnosis not present

## 2019-03-27 DIAGNOSIS — I252 Old myocardial infarction: Secondary | ICD-10-CM | POA: Diagnosis not present

## 2019-03-27 DIAGNOSIS — I10 Essential (primary) hypertension: Secondary | ICD-10-CM

## 2019-03-27 DIAGNOSIS — Z886 Allergy status to analgesic agent status: Secondary | ICD-10-CM | POA: Diagnosis not present

## 2019-03-27 DIAGNOSIS — Z950 Presence of cardiac pacemaker: Secondary | ICD-10-CM | POA: Insufficient documentation

## 2019-03-27 DIAGNOSIS — Z882 Allergy status to sulfonamides status: Secondary | ICD-10-CM | POA: Diagnosis not present

## 2019-03-27 DIAGNOSIS — I441 Atrioventricular block, second degree: Secondary | ICD-10-CM | POA: Insufficient documentation

## 2019-03-27 DIAGNOSIS — Z91040 Latex allergy status: Secondary | ICD-10-CM | POA: Insufficient documentation

## 2019-03-27 DIAGNOSIS — R2681 Unsteadiness on feet: Secondary | ICD-10-CM | POA: Insufficient documentation

## 2019-03-27 DIAGNOSIS — N400 Enlarged prostate without lower urinary tract symptoms: Secondary | ICD-10-CM | POA: Insufficient documentation

## 2019-03-27 LAB — COMPREHENSIVE METABOLIC PANEL
ALT: 14 U/L (ref 0–44)
AST: 20 U/L (ref 15–41)
Albumin: 4.2 g/dL (ref 3.5–5.0)
Alkaline Phosphatase: 34 U/L — ABNORMAL LOW (ref 38–126)
Anion gap: 14 (ref 5–15)
BUN: 21 mg/dL (ref 8–23)
CO2: 23 mmol/L (ref 22–32)
Calcium: 9.4 mg/dL (ref 8.9–10.3)
Chloride: 102 mmol/L (ref 98–111)
Creatinine, Ser: 1.72 mg/dL — ABNORMAL HIGH (ref 0.61–1.24)
GFR calc Af Amer: 41 mL/min — ABNORMAL LOW (ref >60)
GFR calc non Af Amer: 36 mL/min — ABNORMAL LOW (ref >60)
Glucose, Bld: 237 mg/dL — ABNORMAL HIGH (ref 70–99)
Potassium: 4.4 mmol/L (ref 3.5–5.1)
Sodium: 139 mmol/L (ref 135–145)
Total Bilirubin: 0.6 mg/dL (ref 0.3–1.2)
Total Protein: 6.7 g/dL (ref 6.5–8.1)

## 2019-03-27 LAB — CBC WITH DIFFERENTIAL/PLATELET
Abs Immature Granulocytes: 0.06 10*3/uL (ref 0.00–0.07)
Basophils Absolute: 0 10*3/uL (ref 0.0–0.1)
Basophils Relative: 1 %
Eosinophils Absolute: 0.2 10*3/uL (ref 0.0–0.5)
Eosinophils Relative: 2 %
HCT: 43.6 % (ref 39.0–52.0)
Hemoglobin: 14 g/dL (ref 13.0–17.0)
Immature Granulocytes: 1 %
Lymphocytes Relative: 10 %
Lymphs Abs: 0.7 10*3/uL (ref 0.7–4.0)
MCH: 29.9 pg (ref 26.0–34.0)
MCHC: 32.1 g/dL (ref 30.0–36.0)
MCV: 93 fL (ref 80.0–100.0)
Monocytes Absolute: 0.4 10*3/uL (ref 0.1–1.0)
Monocytes Relative: 6 %
Neutro Abs: 6 10*3/uL (ref 1.7–7.7)
Neutrophils Relative %: 80 %
Platelets: 161 10*3/uL (ref 150–400)
RBC: 4.69 MIL/uL (ref 4.22–5.81)
RDW: 12.4 % (ref 11.5–15.5)
WBC: 7.5 10*3/uL (ref 4.0–10.5)
nRBC: 0 % (ref 0.0–0.2)

## 2019-03-27 LAB — LIPASE, BLOOD: Lipase: 27 U/L (ref 11–51)

## 2019-03-27 LAB — TROPONIN I (HIGH SENSITIVITY)
Troponin I (High Sensitivity): 24 ng/L — ABNORMAL HIGH (ref <18)
Troponin I (High Sensitivity): 30 ng/L — ABNORMAL HIGH (ref <18)

## 2019-03-27 LAB — CBG MONITORING, ED: Glucose-Capillary: 222 mg/dL — ABNORMAL HIGH (ref 70–99)

## 2019-03-27 MED ORDER — AMLODIPINE BESYLATE 5 MG PO TABS
5.0000 mg | ORAL_TABLET | Freq: Every day | ORAL | Status: DC
  Administered 2019-03-28 – 2019-03-29 (×2): 5 mg via ORAL
  Filled 2019-03-27 (×2): qty 1

## 2019-03-27 MED ORDER — GABAPENTIN 400 MG PO CAPS
400.0000 mg | ORAL_CAPSULE | Freq: Three times a day (TID) | ORAL | Status: DC
  Administered 2019-03-28 (×3): 400 mg via ORAL
  Filled 2019-03-27 (×3): qty 1

## 2019-03-27 MED ORDER — INSULIN ASPART 100 UNIT/ML ~~LOC~~ SOLN: 0.0000 [IU] | Freq: Every day | SUBCUTANEOUS | Status: DC

## 2019-03-27 MED ORDER — CITALOPRAM HYDROBROMIDE 20 MG PO TABS
40.0000 mg | ORAL_TABLET | Freq: Every day | ORAL | Status: DC
  Administered 2019-03-28 – 2019-03-29 (×2): 40 mg via ORAL
  Filled 2019-03-27 (×2): qty 2

## 2019-03-27 MED ORDER — TRAMADOL-ACETAMINOPHEN 37.5-325 MG PO TABS
2.0000 | ORAL_TABLET | Freq: Three times a day (TID) | ORAL | Status: DC
  Administered 2019-03-28 – 2019-03-29 (×5): 2 via ORAL
  Filled 2019-03-27 (×6): qty 2

## 2019-03-27 MED ORDER — INSULIN ASPART 100 UNIT/ML ~~LOC~~ SOLN
0.0000 [IU] | Freq: Three times a day (TID) | SUBCUTANEOUS | Status: DC
  Administered 2019-03-28: 1 [IU] via SUBCUTANEOUS
  Administered 2019-03-28 (×2): 2 [IU] via SUBCUTANEOUS
  Administered 2019-03-29 (×2): 1 [IU] via SUBCUTANEOUS

## 2019-03-27 MED ORDER — SIMVASTATIN 20 MG PO TABS
40.0000 mg | ORAL_TABLET | Freq: Every day | ORAL | Status: DC
  Administered 2019-03-28: 23:00:00 40 mg via ORAL
  Filled 2019-03-27: qty 2

## 2019-03-27 MED ORDER — ASPIRIN EC 81 MG PO TBEC
81.0000 mg | DELAYED_RELEASE_TABLET | Freq: Every day | ORAL | Status: DC
  Administered 2019-03-28 – 2019-03-29 (×2): 81 mg via ORAL
  Filled 2019-03-27 (×2): qty 1

## 2019-03-27 MED ORDER — NITROGLYCERIN 0.4 MG SL SUBL: 0.4000 mg | SUBLINGUAL_TABLET | SUBLINGUAL | Status: DC | PRN

## 2019-03-27 MED ORDER — HEPARIN SODIUM (PORCINE) 5000 UNIT/ML IJ SOLN
5000.0000 [IU] | Freq: Three times a day (TID) | INTRAMUSCULAR | Status: DC
  Administered 2019-03-28 – 2019-03-29 (×3): 5000 [IU] via SUBCUTANEOUS
  Filled 2019-03-27 (×3): qty 1

## 2019-03-27 MED ORDER — FUROSEMIDE 20 MG PO TABS
20.0000 mg | ORAL_TABLET | ORAL | Status: DC
  Administered 2019-03-28: 20 mg via ORAL
  Filled 2019-03-27: qty 1

## 2019-03-27 MED ORDER — DOXAZOSIN MESYLATE 8 MG PO TABS
4.0000 mg | ORAL_TABLET | Freq: Every day | ORAL | Status: DC
  Administered 2019-03-28 – 2019-03-29 (×2): 4 mg via ORAL
  Filled 2019-03-27 (×2): qty 1

## 2019-03-27 MED ORDER — FLUTICASONE PROPIONATE 50 MCG/ACT NA SUSP
2.0000 | Freq: Every day | NASAL | Status: DC
  Administered 2019-03-28 – 2019-03-29 (×2): 2 via NASAL
  Filled 2019-03-27: qty 16

## 2019-03-27 MED ORDER — TAMSULOSIN HCL 0.4 MG PO CAPS
0.4000 mg | ORAL_CAPSULE | Freq: Every day | ORAL | Status: DC
  Administered 2019-03-28: 17:00:00 0.4 mg via ORAL
  Filled 2019-03-27: qty 1

## 2019-03-27 MED ORDER — ACETAMINOPHEN 325 MG PO TABS: 650.0000 mg | ORAL_TABLET | ORAL | Status: DC | PRN

## 2019-03-27 NOTE — ED Triage Notes (Signed)
Pt from home with ems for left sided chest pain that started about 1.5 hrs ago while watching tv, radiates down his left arm with left arm numbness. Pt took 2 Nitros and 325 ASA at home. When EMS arrives pt was pain free. Pt a.o, VSS

## 2019-03-27 NOTE — ED Notes (Signed)
Call wife Lelon Frohlich Mardene Celeste but goes by Lelon Frohlich) at [phone] for an update please

## 2019-03-27 NOTE — ED Provider Notes (Signed)
[facility] EMERGENCY DEPARTMENT Provider Note   CSN: [id number] Arrival date & time: 03/27/19  1813     History Chief Complaint  Patient presents with  . Chest Pain  . Numbness    Gary [name] is a 86 y.o. male with a past medical history for hypertension, dyslipidemia, benign prostatic hyperplasia, chronic kidney disease stage III, diverticulosis, and complete heart block status post pacemaker who presents to the ED due to left-sided chest pain that occurred 1 and half hours ago while watching TV.  Patient describes pain as a dull sensation located right above where his pacemaker was implanted that radiates down his left arm associated with numbness and tingling.  Patient also admits to shortness of breath with the chest pain.  Patient states chest pain lasted roughly 10 minutes.  He took 2 nitros and 325 ASA in which pain was relieved.  Patient also admits to generalized weakness and bilateral hand tremors.  He notes he feels numbness and tingling in his hands and fingers mostly at night.  Patient denies recent illness.  He admits to generalized abdominal pain for the past few months.  Denies nausea, vomiting, diarrhea.  Denies urinary symptoms.  Patient is currently chest pain-free during my initial exam.  Chart reviewed.  Patient was seen on 04/07/2019 for arm tightness associated with dull chest pain and some shortness of breath.  Cardiology was consulted then who felt patient was stable for discharge with cardiology follow-up.  Hx NSTEMI 2011.     Past Medical History:  Diagnosis Date  . Adenomatous colon polyp    2010   . Anemia   . Anxiety   . Arthritis    "hips and lower back"  . Arthritis pain   . Blood transfusion   . Chest pain 03/29/11   2D Echo without contrast on 03/29/11 - EF= 55-60%.  . Cholecystitis   . Chronic back pain greater than 3 months duration   . Chronic kidney disease   . Colon polyps   . Complication of anesthesia    once slow to  wake up  . Coronary angioplasty status 2011  . Coronary artery disease   . Diarrhea   . Diverticulosis   . Dyslipidemia   . GERD (gastroesophageal reflux disease)   . GI bleeding after 07/2009   "triggered by Plavix & ASA; from my diverticulosis"  . H/O Clostridium difficile infection   . H/O myocardial perfusion scan 04/04/11   For Pre-noncardiac surgery - EF = 67%, no ischemia, and is considered low risk.  Marland Kitchen Heart attack ([place]) 07/2009   nonSTEMI with an occluded circumflex artery and collaterals.  Marland Kitchen Heart murmur   . Hemorrhoids   . History of shingles   . Hyperlipidemia   . Hypertension   . Kidney stones   . Lower extremity edema    Taking furosemide and it seems to be helping.  . Neuromuscular disorder ([place])   . Nonspecific chest pain 08/26/2012   Went to ED 08/26/12  . Stomach problems   . Syncope and collapse 04/16/2018    Patient Active Problem List   Diagnosis Date Noted  . Complete heart block ([place]) 04/17/2018  . Syncope and collapse 04/16/2018  . Sinus pause 04/16/2018  . Snoring 10/03/2017  . Other fatigue 10/03/2017  . Pre-syncope 05/14/2016  . Murmur 05/14/2016  . CAD in native artery 10/31/2015  . History of MI (myocardial infarction) 10/31/2015  . Preoperative cardiovascular examination 12/26/2014  . History of colonic  polyps 01/17/2014  . PAF (paroxysmal atrial fibrillation) ([place]) 08/14/2013  . Metabolic acidosis 67/89/3810  . Acute renal failure ([place]) 08/13/2013  . Chronic kidney disease, stage 3 08/13/2013  . Anemia in chronic kidney disease 08/13/2013  . Hypokalemia 08/12/2013  . Acute blood loss anemia 08/09/2013  . Encephalopathy 08/09/2013  . Severe sepsis ([place]) 08/08/2013  . Hypotension, unspecified 08/07/2013  . Complex renal cyst 08/07/2013  . Renal mass, left 08/06/2013  . Upper GI bleeding 08/05/2013  . Acute esophagitis 08/05/2013  . Urinary retention 08/04/2013  . S/P total hip arthroplasty 08/03/2013  . Degenerative joint disease (DJD)  of hip 08/03/2013    Class: Chronic  . Internal hemorrhoids with other complication 17/51/0258  . Tinnitus 10/22/2012  . C. difficile colitis 07/26/2012  . Ileus, postoperative ([place]) 07/24/2012  . Somnolence 07/24/2012  . Anxiety state 07/24/2012  . Chest pain- ER 08/26/12 03/30/2011  . Fever 03/28/2011  . Leucocytosis 03/28/2011  . CKD (chronic kidney disease), stage III ([place]) 03/28/2011  . Hyponatremia 02/02/2011  . BPH (benign prostatic hyperplasia) 02/01/2011  . Cholecystitis chronic, acute 01/26/2011  . Dyslipidemia 01/22/2011  . Abdominal bloating 08/07/2010  . Loose stools 08/07/2010  . Coronary atherosclerosis 10/26/2009  . Essential hypertension 06/07/2009  . DIVERTICULOSIS OF COLON 10/31/2008  . DIVERTICULOSIS, COLON, WITH HEMORRHAGE, surg 12/11  10/31/2008  . Personal history of colonic polyps 10/31/2008    Past Surgical History:  Procedure Laterality Date  . BACK SURGERY  1997  . CARDIAC CATHETERIZATION  08/09/2009   Circumflex 100% occluded with right-to-left collaterals, mild irregularities in the proximal and distal LAD and diagonal system, normal LV systolic function, and minor infrarenal irregularities, but no abdominal aortic aneurysm.  . CHOLECYSTECTOMY  04/12/2011   Procedure: CHOLECYSTECTOMY;  Surgeon: Adin Hector, MD;  Location: [facility];  Service: General;  Laterality: N/A;  . CHOLECYSTECTOMY  04/12/2011   Procedure: LAPAROSCOPIC CHOLECYSTECTOMY;  Surgeon: Adin Hector, MD;  Location: [facility];  Service: General;  Laterality: N/A;  converted to open  . COLON SURGERY     COLONOSCOPY  . ESOPHAGOGASTRODUODENOSCOPY N/A 08/05/2013   Procedure: ESOPHAGOGASTRODUODENOSCOPY (EGD);  Surgeon: Jerene Bears, MD;  Location: [facility];  Service: Gastroenterology;  Laterality: N/A;  . EYE SURGERY  1942   "right eye was crossed; they  corrected it"  . LUMBAR FUSION  07/22/2012  . PACEMAKER IMPLANT N/A 04/17/2018   Procedure: PACEMAKER IMPLANT;  Surgeon: Deboraha Sprang,  MD;  Location: [facility];  Service: Cardiovascular;  Laterality: N/A;  . POSTERIOR FUSION LUMBAR SPINE  08/1995   "bottom 4 vertebra"  . SUBTOTAL COLECTOMY  01/24/2010  . TOTAL HIP ARTHROPLASTY Right 08/03/2013   Procedure: TOTAL HIP ARTHROPLASTY ANTERIOR APPROACH;  Surgeon: Hessie Dibble, MD;  Location: [facility];  Service: Orthopedics;  Laterality: Right;       Family History  Problem Relation Age of Onset  . Heart disease Father   . Stroke Father   . Pancreatic cancer Mother   . Diverticulosis Sister        x4  . Colon cancer Neg Hx     Social History   Tobacco Use  . Smoking status: Never Smoker  . Smokeless tobacco: Never Used  Substance Use Topics  . Alcohol use: No  . Drug use: No    Home Medications Prior to Admission medications   Medication Sig Start Date End Date Taking? Authorizing Provider  acetaminophen (TYLENOL) 500 MG tablet Take 1,000 mg by mouth every  8 (eight) hours as needed for fever.    [provider]  AMBULATORY NON FORMULARY MEDICATION Place 2 mLs rectally 3 (three) times daily. Medication Name: 5 % lidocaine ointment mixed with 0.125 nitroglycerin ointment Patient not taking: Reported on 03/18/2019 06/24/16   Esterwood, Amy S, PA-C  amLODipine (NORVASC) 5 MG tablet Take 1 tablet (5 mg total) by mouth daily. 10/30/18 03/18/19  Pixie Casino, MD  aspirin EC 81 MG tablet Take 81 mg by mouth daily.    [provider]  citalopram (CELEXA) 40 MG tablet Take 40 mg by mouth daily. 05/12/18   [provider]  doxazosin (CARDURA) 4 MG tablet Take 4 mg by mouth daily.     [provider]  fluticasone (FLONASE) 50 MCG/ACT nasal spray Place 2 sprays into both nostrils daily.     [provider]  furosemide (LASIX) 20 MG tablet Take 1 tablet (20 mg total) by mouth every other day. 10/07/18   Hilty, Nadean Corwin, MD  gabapentin (NEURONTIN) 400 MG capsule Take 400 mg by mouth 3 (three) times daily.     [provider]  nitroGLYCERIN (NITROSTAT) 0.4 MG SL tablet PLACE 1 TABLET UNDER THE TONGUE EVERY 5 MINUTES AS NEEDED FOR CHEST PAIN FOR 3 DOSES Patient taking differently: Place 0.4 mg under the tongue every 5 (five) minutes as needed for chest pain.  10/25/16   Hilty, Nadean Corwin, MD  simvastatin (ZOCOR) 40 MG tablet TAKE 1 TABLET BY MOUTH AT BEDTIME Patient taking differently: Take 40 mg by mouth at bedtime.  03/18/16   Pixie Casino, MD  Tamsulosin HCl (FLOMAX) 0.4 MG CAPS Take 0.4 mg by mouth daily after supper. 02/04/11   Charlynne Cousins, MD  traMADol-acetaminophen (ULTRACET) 37.5-325 MG per tablet Take 2 tablets by mouth 3 (three) times daily.  09/23/12   [provider]    Allergies    Naproxen, Clopidogrel bisulfate, Sulfonamide derivatives, Other, Latex, and Tape  Review of Systems   Review of Systems  Constitutional: Negative for chills and fever.  Respiratory: Positive for shortness of breath.   Cardiovascular: Positive for chest pain. Negative for leg swelling.  Gastrointestinal: Positive for abdominal pain. Negative for diarrhea, nausea and vomiting.  Genitourinary: Negative for dysuria.  Musculoskeletal: Negative for back pain.  Neurological: Positive for weakness (generalized) and numbness.  All other systems reviewed and are negative.   Physical Exam Updated Vital Signs BP (!) 167/70 (BP Location: Right Arm)   Pulse 89   Temp 98.3 F (36.8 C) (Oral)   Resp 18   SpO2 98%   Physical Exam Vitals and nursing note reviewed.  Constitutional:      General: He is not in acute distress.    Appearance: He is not ill-appearing.  HENT:     Head: Normocephalic.  Eyes:     Pupils: Pupils are equal, round, and reactive to light.  Cardiovascular:     Rate and Rhythm: Normal rate and regular rhythm.     Pulses: Normal pulses.     Heart sounds: Normal heart sounds. No murmur. No friction rub. No gallop.   Pulmonary:     Effort: Pulmonary effort is normal.      Breath sounds: Normal breath sounds.  Abdominal:     General: Abdomen is flat. Bowel sounds are normal. There is no distension.     Palpations: Abdomen is soft.     Tenderness: There is abdominal tenderness. There is no guarding or rebound.  Comments: Generalized abdominal tenderness.  No focal tenderness.  No rebound or guarding.  Musculoskeletal:     Cervical back: Neck supple.     Comments: Able to move all 4 extremities without difficulty.  No lower extremity edema.  Skin:    General: Skin is warm and dry.  Neurological:     General: No focal deficit present.     Mental Status: He is alert.  Psychiatric:        Mood and Affect: Mood normal.        Behavior: Behavior normal.     ED Results / Procedures / Treatments   Labs (all labs ordered are listed, but only abnormal results are displayed) Labs Reviewed  CBG MONITORING, ED - Abnormal; Notable for the following components:      Result Value   Glucose-Capillary 222 (*)    All other components within normal limits  CBC WITH DIFFERENTIAL/PLATELET  COMPREHENSIVE METABOLIC PANEL  LIPASE, BLOOD  TROPONIN I (HIGH SENSITIVITY)    EKG EKG Interpretation  Date/Time:  Saturday March 27 2019 18:18:26 EST Ventricular Rate:  87 PR Interval:    QRS Duration: 158 QT Interval:  454 QTC Calculation: 547 R Axis:   -73 Text Interpretation: Atrial-sensed ventricular-paced rhythm No further analysis attempted due to paced rhythm now V paced Confirmed by Ezequiel Essex [phone]) on 03/27/2019 6:32:37 PM   Radiology No results found.  Procedures Procedures (including critical care time)  Medications Ordered in ED Medications - No data to display  ED Course  I have reviewed the triage vital signs and the nursing notes.  Pertinent labs & imaging results that were available during my care of the patient were reviewed by me and considered in my medical decision making (see chart for details).  Clinical Course as of Mar 26 2356  Sat Mar 27, 2019  2005 Spoke to Dr. Georgette Shell with cardiology who agrees to evaluate patient at bedside.   [CA]  2313 Discussed case with Dr. Marlowe Sax who agrees to evaluate and admit patient for further treatment.   [CA]    Clinical Course User Index [CA] Karie Kirks   MDM Rules/Calculators/A&P                     86 year old male presents to the ED due to left-sided chest pain that radiates down his left arm that lasted roughly 10 minutes.  Stable vitals.  Patient is afebrile not tachycardic or hypoxic.  Patient in no acute distress and nontoxic-appearing.  Patient is currently chest pain-free during my initial exam.  CXR personally reviewed which demonstrates: Low lung volumes volumes with bronchovascular crowding. No definite  new focal opacity.   EKG personally reviewed which demonstrates paced rhythm.  CBC reassuring with no leukocytosis.  Lipase normal at 27.  CMP significant for hyperglycemia at 237 which appears to be slightly elevated at baseline.  No anion gap.  Doubt DKA.  Initial troponin elevated at 24.  Patient had elevated troponins 9 days ago.  Will obtain delta troponin.  Will also consult cardiology for further evaluation. Discussed case with Dr. Wyvonnia Dusky who evaluated patient at bedside and agrees with assessment and plan.  2nd troponin uptrending to 30. Cardiology evaluated patient at bedside who recommends trending troponins. Nuclear stress test will be performed in the morning. Will consult for medical admission.  Discussed case with Dr. Marlowe Sax who agrees to admit patient for further evaluation and further cardiac workup.   Final Clinical Impression(s) / ED  Diagnoses Final diagnoses:  None    Rx / DC Orders ED Discharge Orders    None       Karie Kirks 03/28/19 0000    Ezequiel Essex, MD 03/28/19 1239

## 2019-03-27 NOTE — H&P (Signed)
History and Physical    [name]:[id number] DOB: [birth year] DOA: 03/27/2019  PCP: Minette Brine, FNP Patient coming from: Home  Chief Complaint: Chest pain  HPI: Gary [name] is a 86 y.o. male with medical history significant of CAD status post NSTEMI in 2011 with occluded LCx with collaterals (medically managed), CHB status post [employer] PPM, hypertension, hyperlipidemia, OSA, CKD stage III presenting to the ED via EMS for evaluation of chest pain.  Patient took 2 sublingual nitroglycerin tablets and aspirin 325 mg at home.  Upon EMS arrival, chest pain-free and vital signs stable.  Patient denies having any chest pain.  States he called EMS to get his blood sugar and everything else checked.  Also at the time he felt that his entire body was warm and he was sweating.  No nausea or vomiting.  He continued to deny any chest pain/pressure/discomfort.  Unclear if he had any dyspnea.  Patient was digressing a lot and no additional history could be obtained from him.  ED Course: Blood pressure elevated, remainder of vital signs stable.  High-sensitivity troponin 24 >30.  EKG with paced rhythm. Chest x-ray showing low lung volumes with bronchovascular crowding.  No definite new focal opacity. Head CT was ordered due to patient's complaints of left arm tingling, study negative for acute intracranial abnormality. Cardiology consulted.  Review of Systems:  All systems reviewed and apart from history of presenting illness, are negative.  Past Medical History:  Diagnosis Date  . Adenomatous colon polyp    2010   . Anemia   . Anxiety   . Arthritis    "hips and lower back"  . Arthritis pain   . Blood transfusion   . Chest pain 03/29/11   2D Echo without contrast on 03/29/11 - EF= 55-60%.  . Cholecystitis   . Chronic back pain greater than 3 months duration   . Chronic kidney disease   . Colon polyps   . Complication of anesthesia    once slow to wake up  . Coronary angioplasty  status 2011  . Coronary artery disease   . Diarrhea   . Diverticulosis   . Dyslipidemia   . GERD (gastroesophageal reflux disease)   . GI bleeding after 07/2009   "triggered by Plavix & ASA; from my diverticulosis"  . H/O Clostridium difficile infection   . H/O myocardial perfusion scan 04/04/11   For Pre-noncardiac surgery - EF = 67%, no ischemia, and is considered low risk.  Marland Kitchen Heart attack ([place]) 07/2009   nonSTEMI with an occluded circumflex artery and collaterals.  Marland Kitchen Heart murmur   . Hemorrhoids   . History of shingles   . Hyperlipidemia   . Hypertension   . Kidney stones   . Lower extremity edema    Taking furosemide and it seems to be helping.  . Neuromuscular disorder ([place])   . Nonspecific chest pain 08/26/2012   Went to ED 08/26/12  . Stomach problems   . Syncope and collapse 04/16/2018    Past Surgical History:  Procedure Laterality Date  . BACK SURGERY  1997  . CARDIAC CATHETERIZATION  08/09/2009   Circumflex 100% occluded with right-to-left collaterals, mild irregularities in the proximal and distal LAD and diagonal system, normal LV systolic function, and minor infrarenal irregularities, but no abdominal aortic aneurysm.  . CHOLECYSTECTOMY  04/12/2011   Procedure: CHOLECYSTECTOMY;  Surgeon: Adin Hector, MD;  Location: [place];  Service: General;  Laterality: N/A;  . CHOLECYSTECTOMY  04/12/2011  Procedure: LAPAROSCOPIC CHOLECYSTECTOMY;  Surgeon: Adin Hector, MD;  Location: [place];  Service: General;  Laterality: N/A;  converted to open  . COLON SURGERY     COLONOSCOPY  . ESOPHAGOGASTRODUODENOSCOPY N/A 08/05/2013   Procedure: ESOPHAGOGASTRODUODENOSCOPY (EGD);  Surgeon: Jerene Bears, MD;  Location: [place];  Service: Gastroenterology;  Laterality: N/A;  . EYE SURGERY  1942   "right eye was crossed; they  corrected it"  . LUMBAR FUSION  07/22/2012  . PACEMAKER IMPLANT N/A 04/17/2018   Procedure: PACEMAKER IMPLANT;  Surgeon: Deboraha Sprang, MD;  Location: [place] [facility];  Service: Cardiovascular;  Laterality: N/A;  . POSTERIOR FUSION LUMBAR SPINE  08/1995   "bottom 4 vertebra"  . SUBTOTAL COLECTOMY  01/24/2010  . TOTAL HIP ARTHROPLASTY Right 08/03/2013   Procedure: TOTAL HIP ARTHROPLASTY ANTERIOR APPROACH;  Surgeon: Hessie Dibble, MD;  Location: [place];  Service: Orthopedics;  Laterality: Right;     reports that he has never smoked. He has never used smokeless tobacco. He reports that he does not drink alcohol or use drugs.  Allergies  Allergen Reactions  . Naproxen Rash  . Clopidogrel Bisulfate Other (See Comments)    H/O diverticulitis; prone to rectal bleeding.  Plavix complicated this.  jkl  . Sulfonamide Derivatives Hives  . Other Other (See Comments)    Narcotics-C. Diff  . Latex Rash  . Tape Itching and Rash    Latex-Adhesive tape    Family History  Problem Relation Age of Onset  . Heart disease Father   . Stroke Father   . Pancreatic cancer Mother   . Diverticulosis Sister        x4  . Colon cancer Neg Hx     Prior to Admission medications   Medication Sig Start Date End Date Taking? Authorizing Provider  acetaminophen (TYLENOL) 500 MG tablet Take 1,000 mg by mouth every 8 (eight) hours as needed for fever.   Yes [provider]  amLODipine (NORVASC) 5 MG tablet Take 1 tablet (5 mg total) by mouth daily. 10/30/18 03/27/19 Yes Hilty, Nadean Corwin, MD  aspirin EC 81 MG tablet Take 81 mg by mouth daily.   Yes [provider]  citalopram (CELEXA) 40 MG tablet Take 40 mg by mouth daily. 05/12/18  Yes [provider]  doxazosin (CARDURA) 4 MG tablet Take 4 mg by mouth daily.    Yes [provider]  fluticasone (FLONASE) 50 MCG/ACT nasal spray Place 2 sprays into both nostrils daily.    Yes [provider]  furosemide (LASIX) 20 MG tablet Take 1 tablet (20 mg total) by mouth every other day. 10/07/18  Yes Hilty, Nadean Corwin, MD  gabapentin (NEURONTIN) 400 MG capsule Take 400 mg by mouth 3  (three) times daily.    Yes [provider]  nitroGLYCERIN (NITROSTAT) 0.4 MG SL tablet PLACE 1 TABLET UNDER THE TONGUE EVERY 5 MINUTES AS NEEDED FOR CHEST PAIN FOR 3 DOSES Patient taking differently: Place 0.4 mg under the tongue every 5 (five) minutes as needed for chest pain.  10/25/16  Yes Hilty, Nadean Corwin, MD  simvastatin (ZOCOR) 40 MG tablet TAKE 1 TABLET BY MOUTH AT BEDTIME Patient taking differently: Take 40 mg by mouth at bedtime.  03/18/16  Yes Hilty, Nadean Corwin, MD  Tamsulosin HCl (FLOMAX) 0.4 MG CAPS Take 0.4 mg by mouth daily after supper. 02/04/11  Yes Charlynne Cousins, MD  traMADol-acetaminophen (ULTRACET) 37.5-325 MG per tablet Take 2 tablets by mouth 3 (  three) times daily.  09/23/12  Yes [provider]  AMBULATORY NON FORMULARY MEDICATION Place 2 mLs rectally 3 (three) times daily. Medication Name: 5 % lidocaine ointment mixed with 0.125 nitroglycerin ointment Patient not taking: Reported on 03/18/2019 06/24/16   Alfredia Ferguson, PA-C    Physical Exam: Vitals:   03/27/19 2030 03/27/19 2119 03/27/19 2200 03/27/19 2230  BP: (!) 158/82  (!) 164/86 (!) 164/83  Pulse: 78 87 86 86  Resp: 16 17 18 20   Temp:      TempSrc:      SpO2: 97% 98% 97% 97%    Physical Exam  Constitutional: He is oriented to person, place, and time. He appears well-developed and well-nourished. No distress.  HENT:  Head: Normocephalic.  Eyes: Right eye exhibits no discharge. Left eye exhibits no discharge.  Cardiovascular: Normal rate, regular rhythm and intact distal pulses.  Pulmonary/Chest: Effort normal and breath sounds normal. No respiratory distress. He has no wheezes. He has no rales.  Abdominal: Soft. Bowel sounds are normal. He exhibits no distension. There is no abdominal tenderness. There is no guarding.  Musculoskeletal:        General: No edema.     Cervical back: Neck supple.  Neurological: He is alert and oriented to person, place, and time.  No focal motor or sensory  deficit  Skin: Skin is warm and dry. He is not diaphoretic.     Labs on Admission: I have personally reviewed following labs and imaging studies  CBC: Recent Labs  Lab 03/27/19 1847  WBC 7.5  NEUTROABS 6.0  HGB 14.0  HCT 43.6  MCV 93.0  PLT 023   Basic Metabolic Panel: Recent Labs  Lab 03/27/19 1847  NA 139  K 4.4  CL 102  CO2 23  GLUCOSE 237*  BUN 21  CREATININE 1.72*  CALCIUM 9.4   GFR: CrCl cannot be calculated (Unknown ideal weight.). Liver Function Tests: Recent Labs  Lab 03/27/19 1847  AST 20  ALT 14  ALKPHOS 34*  BILITOT 0.6  PROT 6.7  ALBUMIN 4.2   Recent Labs  Lab 03/27/19 1847  LIPASE 27   No results for input(s): AMMONIA in the last 168 hours. Coagulation Profile: No results for input(s): INR, PROTIME in the last 168 hours. Cardiac Enzymes: No results for input(s): CKTOTAL, CKMB, CKMBINDEX, TROPONINI in the last 168 hours. BNP (last 3 results) No results for input(s): PROBNP in the last 8760 hours. HbA1C: No results for input(s): HGBA1C in the last 72 hours. CBG: Recent Labs  Lab 03/27/19 1830  GLUCAP 222*   Lipid Profile: No results for input(s): CHOL, HDL, LDLCALC, TRIG, CHOLHDL, LDLDIRECT in the last 72 hours. Thyroid Function Tests: No results for input(s): TSH, T4TOTAL, FREET4, T3FREE, THYROIDAB in the last 72 hours. Anemia Panel: No results for input(s): VITAMINB12, FOLATE, FERRITIN, TIBC, IRON, RETICCTPCT in the last 72 hours. Urine analysis:    Component Value Date/Time   COLORURINE YELLOW 04/16/2018 0428   APPEARANCEUR CLEAR 04/16/2018 0428   LABSPEC 1.016 04/16/2018 0428   PHURINE 8.0 04/16/2018 0428   GLUCOSEU 50 (A) 04/16/2018 0428   HGBUR NEGATIVE 04/16/2018 0428   BILIRUBINUR NEGATIVE 04/16/2018 0428   KETONESUR NEGATIVE 04/16/2018 0428   PROTEINUR 30 (A) 04/16/2018 0428   UROBILINOGEN 0.2 08/13/2013 1422   NITRITE NEGATIVE 04/16/2018 0428   LEUKOCYTESUR NEGATIVE 04/16/2018 0428    Radiological Exams on  Admission: CT Head Wo Contrast  Result Date: 03/27/2019 CLINICAL DATA:  Subacute left arm tingling EXAM:  CT HEAD WITHOUT CONTRAST TECHNIQUE: Contiguous axial images were obtained from the base of the skull through the vertex without intravenous contrast. COMPARISON:  04/16/2018 FINDINGS: Brain: No evidence of acute infarction, hemorrhage, hydrocephalus, extra-axial collection or mass lesion/mass effect. Subcortical white matter and periventricular small vessel ischemic changes. Vascular: Intracranial atherosclerosis. Skull: Normal. Negative for fracture or focal lesion. Sinuses/Orbits: The visualized paranasal sinuses are essentially clear. The mastoid air cells are unopacified. Other: None. IMPRESSION: No evidence of acute intracranial abnormality. Small vessel ischemic changes. Electronically Signed   By: Julian Hy M.D.   On: 03/27/2019 21:07   DG Chest Portable 1 View  Result Date: 03/27/2019 CLINICAL DATA:  Pt from home for left sided chest pain that started about 1.5 hrs ago while watching tv, radiates down his left arm with left arm numbness EXAM: PORTABLE CHEST 1 VIEW COMPARISON:  Chest radiograph 03/18/2019 FINDINGS: Stable cardiomediastinal contours with enlarged heart size. Left chest pacer remains in place. Low lung volumes with bronchovascular crowding. No new focal pulmonary opacity. No pneumothorax or large pleural effusion. No acute finding in the visualized skeleton. IMPRESSION: Low lung volumes volumes with bronchovascular crowding. No definite new focal opacity. Electronically Signed   By: Audie Pinto M.D.   On: 03/27/2019 19:13    EKG: Independently reviewed.  Paced rhythm.  Assessment/Plan Principal Problem:   Chest pain Active Problems:   Essential hypertension   Dyslipidemia   CKD (chronic kidney disease), stage III (HCC)   Chronic kidney disease, stage 3   Chest pain in the setting of known history of CAD High-sensitivity troponin 24 >30.  EKG with paced  rhythm.  Patient does have a known history of CAD with an occluded circumflex with right-to-left collaterals and has been medically managed.  Last stress test in 2016 was low risk.   -Cardiac monitoring -Cardiology consulted and planning on doing nuclear stress test in the morning for further evaluation -Continue home aspirin, statin -Sublingual nitroglycerin as needed  Hypertension Blood pressure elevated with systolic up to 449Q. -Continue amlodipine 5 mg daily, Lasix 20 mg daily.  Increase dose of home Lasix if blood pressure continues to be elevated.  Hyperglycemia Random blood glucose 237.  No documented history of diabetes and no signs of DKA. -Check A1c -Sliding scale insulin sensitive ACHS and CBG checks  CKD stage III -Stable.  Renal function at baseline.  Hyperlipidemia -Continue home Zocor  ?AMS It was very difficult to obtain a history from the patient.  AAO x3, however, he was digressing a lot and not answering questions appropriately.  No focal neuro deficit.  Head CT negative for acute intracranial abnormality.  ?Baseline dementia.  Also has difficulty hearing and wears hearing aids which makes assessment challenging.  No family available at this time to obtain collateral information.  -Consider discussing with family in a.m. -Check B12, TSH, ammonia levels -Continue to monitor, consider brain MRI if no improvement  DVT prophylaxis: Subcutaneous heparin Code Status: Full code.  Unable to discuss CODE STATUS with the patient at this time as he was digressing a lot and not answering questions appropriately.  This needs to be readdressed in a.m. Family Communication: No family available at this time. Disposition Plan: Anticipate discharge after clinical improvement. Consults called: Cardiology Admission status: It is my clinical opinion that referral for OBSERVATION is reasonable and necessary in this patient based on the above information provided. The aforementioned  taken together are felt to place the patient at high risk for further clinical deterioration. However  it is anticipated that the patient may be medically stable for discharge from the hospital within 24 to 48 hours.  The medical decision making on this patient was of high complexity and the patient is at high risk for clinical deterioration, therefore this is a level 3 visit.  Shela Leff MD [facility]  If 7PM-7AM, please contact night-coverage [email or web address] Password [email or web address]  03/27/2019, 11:38 PM

## 2019-03-27 NOTE — Consult Note (Signed)
Cardiology Consultation:   Patient ID: Gary [name] MRN: [id number]; DOB: [birth year]  Admit date: 03/27/2019 Date of Consult: 03/27/2019  Primary Care Provider: Minette Brine, [place] Primary Cardiologist: Pixie Casino, MD Primary Electrophysiologist:  Virl Axe, MD    Patient Profile:   Gary [name] is a 86 y.o. male with PMH of CAD s/p NSTEMI in 2011 with occluded LCx with collaterals (medically managed), CHB s/p medtronic PPM, HTN, HLD, ?PAF in the setting of sepsis, OSA, and CKD stage 3, who presents with abdominal/chest pain  History of Present Illness:   Gary [name] is a somewhat poor historian with tangential thought process and difficult to follow presentation details but per his report he was watching TV this evening when he developed epigastric and abdominal discomfort which radiated to his chest.  This was also associated with bilateral hand tingling.  He reports that this "did not last long" but that he took 2 times sublingual nitroglycerin 5 minutes apart and 4 baby aspirin's during that time.  Of his chest pain.  He is unable to tell me how long that it lasted but has now resolved.  Pain was a substernal ache and not associated with shortness of breath, diaphoresis.  The patient does report other episodes of warmth and diaphoresis which he reports he was concerned his blood sugar was elevated.  In trying to further characterize the chest pain, the patient was unable to describe how frequently he has chest pain or whether this has happened more than the episode tonight and on 4 February.  He is relatively sedentary at baseline.  Notably, the patient was recently seen in the emergency room on 03/18/2019 for very similar episode.  At that time, it was felt that his chest pain was atypical and he had flat troponin levels from 35-31 and thus there was no further ischemic work-up completed at that time.  Heart Pathway Score:  HEAR Score: 6  Past Medical History:  Diagnosis Date  .  Adenomatous colon polyp    2010   . Anemia   . Anxiety   . Arthritis    "hips and lower back"  . Arthritis pain   . Blood transfusion   . Chest pain 03/29/11   2D Echo without contrast on 03/29/11 - EF= 55-60%.  . Cholecystitis   . Chronic back pain greater than 3 months duration   . Chronic kidney disease   . Colon polyps   . Complication of anesthesia    once slow to wake up  . Coronary angioplasty status 2011  . Coronary artery disease   . Diarrhea   . Diverticulosis   . Dyslipidemia   . GERD (gastroesophageal reflux disease)   . GI bleeding after 07/2009   "triggered by Plavix & ASA; from my diverticulosis"  . H/O Clostridium difficile infection   . H/O myocardial perfusion scan 04/04/11   For Pre-noncardiac surgery - EF = 67%, no ischemia, and is considered low risk.  Marland Kitchen Heart attack ([place]) 07/2009   nonSTEMI with an occluded circumflex artery and collaterals.  Marland Kitchen Heart murmur   . Hemorrhoids   . History of shingles   . Hyperlipidemia   . Hypertension   . Kidney stones   . Lower extremity edema    Taking furosemide and it seems to be helping.  . Neuromuscular disorder ([place])   . Nonspecific chest pain 08/26/2012   Went to ED 08/26/12  . Stomach problems   . Syncope and collapse 04/16/2018  Past Surgical History:  Procedure Laterality Date  . BACK SURGERY  1997  . CARDIAC CATHETERIZATION  08/09/2009   Circumflex 100% occluded with right-to-left collaterals, mild irregularities in the proximal and distal LAD and diagonal system, normal LV systolic function, and minor infrarenal irregularities, but no abdominal aortic aneurysm.  . CHOLECYSTECTOMY  04/12/2011   Procedure: CHOLECYSTECTOMY;  Surgeon: Adin Hector, MD;  Location: [place];  Service: General;  Laterality: N/A;  . CHOLECYSTECTOMY  04/12/2011   Procedure: LAPAROSCOPIC CHOLECYSTECTOMY;  Surgeon: Adin Hector, MD;  Location: [place];  Service: General;  Laterality: N/A;  converted to open  . COLON SURGERY      COLONOSCOPY  . ESOPHAGOGASTRODUODENOSCOPY N/A 08/05/2013   Procedure: ESOPHAGOGASTRODUODENOSCOPY (EGD);  Surgeon: Jerene Bears, MD;  Location: [place];  Service: Gastroenterology;  Laterality: N/A;  . EYE SURGERY  1942   "right eye was crossed; they  corrected it"  . LUMBAR FUSION  07/22/2012  . PACEMAKER IMPLANT N/A 04/17/2018   Procedure: PACEMAKER IMPLANT;  Surgeon: Deboraha Sprang, MD;  Location: [place];  Service: Cardiovascular;  Laterality: N/A;  . POSTERIOR FUSION LUMBAR SPINE  08/1995   "bottom 4 vertebra"  . SUBTOTAL COLECTOMY  01/24/2010  . TOTAL HIP ARTHROPLASTY Right 08/03/2013   Procedure: TOTAL HIP ARTHROPLASTY ANTERIOR APPROACH;  Surgeon: Hessie Dibble, MD;  Location: [place];  Service: Orthopedics;  Laterality: Right;     Home Medications:  Prior to Admission medications   Medication Sig Start Date End Date Taking? Authorizing Provider  acetaminophen (TYLENOL) 500 MG tablet Take 1,000 mg by mouth every 8 (eight) hours as needed for fever.   Yes [provider]  amLODipine (NORVASC) 5 MG tablet Take 1 tablet (5 mg total) by mouth daily. 10/30/18 03/27/19 Yes Hilty, Nadean Corwin, MD  aspirin EC 81 MG tablet Take 81 mg by mouth daily.   Yes [provider]  citalopram (CELEXA) 40 MG tablet Take 40 mg by mouth daily. 05/12/18  Yes [provider]  doxazosin (CARDURA) 4 MG tablet Take 4 mg by mouth daily.    Yes [provider]  fluticasone (FLONASE) 50 MCG/ACT nasal spray Place 2 sprays into both nostrils daily.    Yes [provider]  furosemide (LASIX) 20 MG tablet Take 1 tablet (20 mg total) by mouth every other day. 10/07/18  Yes Hilty, Nadean Corwin, MD  gabapentin (NEURONTIN) 400 MG capsule Take 400 mg by mouth 3 (three) times daily.    Yes [provider]  nitroGLYCERIN (NITROSTAT) 0.4 MG SL tablet PLACE 1 TABLET UNDER THE TONGUE EVERY 5 MINUTES AS NEEDED FOR CHEST PAIN FOR 3 DOSES Patient taking differently: Place  0.4 mg under the tongue every 5 (five) minutes as needed for chest pain.  10/25/16  Yes Hilty, Nadean Corwin, MD  simvastatin (ZOCOR) 40 MG tablet TAKE 1 TABLET BY MOUTH AT BEDTIME Patient taking differently: Take 40 mg by mouth at bedtime.  03/18/16  Yes Hilty, Nadean Corwin, MD  Tamsulosin HCl (FLOMAX) 0.4 MG CAPS Take 0.4 mg by mouth daily after supper. 02/04/11  Yes Charlynne Cousins, MD  traMADol-acetaminophen (ULTRACET) 37.5-325 MG per tablet Take 2 tablets by mouth 3 (three) times daily.  09/23/12  Yes [provider]  AMBULATORY NON FORMULARY MEDICATION Place 2 mLs rectally 3 (three) times daily. Medication Name: 5 % lidocaine ointment mixed with 0.125 nitroglycerin ointment Patient not taking: Reported on 03/18/2019 06/24/16   Alfredia Ferguson, PA-C  Inpatient Medications: Scheduled Meds:  Continuous Infusions:  PRN Meds:   Allergies:    Allergies  Allergen Reactions  . Naproxen Rash  . Clopidogrel Bisulfate Other (See Comments)    H/O diverticulitis; prone to rectal bleeding.  Plavix complicated this.  jkl  . Sulfonamide Derivatives Hives  . Other Other (See Comments)    Narcotics-C. Diff  . Latex Rash  . Tape Itching and Rash    Latex-Adhesive tape    Social History:   Social History   Socioeconomic History  . Marital status: Married    Spouse name: Not on file  . Number of children: 2  . Years of education: Not on file  . Highest education level: Not on file  Occupational History  . Occupation: Art therapist  Tobacco Use  . Smoking status: Never Smoker  . Smokeless tobacco: Never Used  Substance and Sexual Activity  . Alcohol use: No  . Drug use: No  . Sexual activity: Not Currently  Other Topics Concern  . Not on file  Social History Narrative   Daily caffeine use.   Social Determinants of Health   Financial Resource Strain:   . Difficulty of Paying Living Expenses: Not on file  Food Insecurity:   . Worried About Charity fundraiser in the Last  Year: Not on file  . Ran Out of Food in the Last Year: Not on file  Transportation Needs:   . Lack of Transportation (Medical): Not on file  . Lack of Transportation (Non-Medical): Not on file  Physical Activity:   . Days of Exercise per Week: Not on file  . Minutes of Exercise per Session: Not on file  Stress:   . Feeling of Stress : Not on file  Social Connections:   . Frequency of Communication with Friends and Family: Not on file  . Frequency of Social Gatherings with Friends and Family: Not on file  . Attends Religious Services: Not on file  . Active Member of Clubs or Organizations: Not on file  . Attends Archivist Meetings: Not on file  . Marital Status: Not on file  Intimate Partner Violence:   . Fear of Current or Ex-Partner: Not on file  . Emotionally Abused: Not on file  . Physically Abused: Not on file  . Sexually Abused: Not on file    Family History:   Family History  Problem Relation Age of Onset  . Heart disease Father   . Stroke Father   . Pancreatic cancer Mother   . Diverticulosis Sister        x4  . Colon cancer Neg Hx      ROS:  Please see the history of present illness.  All other ROS reviewed and negative.     Physical Exam/Data:   Vitals:   03/27/19 2000 03/27/19 2030 03/27/19 2119 03/27/19 2200  BP: (!) 146/87 (!) 158/82  (!) 164/86  Pulse: 82 78 87 86  Resp: 20 16 17 18   Temp:      TempSrc:      SpO2: 98% 97% 98% 97%   No intake or output data in the 24 hours ending 03/27/19 2226 Last 3 Weights 03/04/2019 10/15/2018 09/10/2018  Weight (lbs) 205 lb 201 lb 201 lb  Weight (kg) 92.987 kg 91.173 kg 91.173 kg     There is no height or weight on file to calculate BMI.  General:  Well nourished, well developed, in no acute distress HEENT: normal Neck: no JVD Cardiac:  normal S1, S2; RRR; no murmur Lungs:  clear to auscultation bilaterally, no wheezing, rhonchi or rales  Abd: soft, nontender, no hepatomegaly  Ext: Trace bilateral  pitting edema Skin: warm and dry  Neuro:  CNs 2-12 intact, no focal abnormalities noted Psych:  Normal affect   EKG:  The EKG was personally reviewed and demonstrates: A sensed V paced rhythm Telemetry:  Telemetry was personally reviewed and demonstrates: Paced rhythm  Relevant CV Studies: Echocardiogram 04/2018: 1. The left ventricle has normal systolic function with an ejection  fraction of 60-65%. The cavity size was normal. Left ventricular diastolic  Doppler parameters are consistent with impaired relaxation. No evidence of  left ventricular regional wall  motion abnormalities.  2. The right ventricle has normal systolic function. The cavity was  normal. There is no increase in right ventricular wall thickness.  3. Left atrial size was mildly dilated.  4. There is moderate mitral annular calcification present. No evidence of  mitral valve stenosis. No significant mitral regurgitation.  5. The aortic valve is tricuspid Moderate calcification of the aortic  valve. no stenosis of the aortic valve.  6. The aortic root is normal in size and structure.  7. There is mild dilatation of the ascending aorta measuring 37 mm.  8. The IVC was not visualized. No complete TR doppler jet so unable to  estimate PA systolic pressure.   NST 2016:  The left ventricular ejection fraction is normal (55-65%).  Nuclear stress EF: 55%.  There was no ST segment deviation noted during stress.  This is a low risk study.  Findings consistent with ischemia and prior myocardial infarction.  Low risk stress nuclear study with a small, severe, partially reversible lateral defect consistent with small prior infarct and very mild peri-infarct ischemia; EF 55 with hypokinesis of the high lateral wall.  Laboratory Data:  High Sensitivity Troponin:   Recent Labs  Lab 03/18/19 0906 03/18/19 1220 03/27/19 1847 03/27/19 2109  TROPONINIHS 35* 31* 24* 30*     Chemistry Recent Labs  Lab  03/27/19 1847  NA 139  K 4.4  CL 102  CO2 23  GLUCOSE 237*  BUN 21  CREATININE 1.72*  CALCIUM 9.4  GFRNONAA 36*  GFRAA 41*  ANIONGAP 14    Recent Labs  Lab 03/27/19 1847  PROT 6.7  ALBUMIN 4.2  AST 20  ALT 14  ALKPHOS 34*  BILITOT 0.6   Hematology Recent Labs  Lab 03/27/19 1847  WBC 7.5  RBC 4.69  HGB 14.0  HCT 43.6  MCV 93.0  MCH 29.9  MCHC 32.1  RDW 12.4  PLT 161   BNPNo results for input(s): BNP, PROBNP in the last 168 hours.  DDimer No results for input(s): DDIMER in the last 168 hours.   Radiology/Studies:  CT Head Wo Contrast  Result Date: 03/27/2019 CLINICAL DATA:  Subacute left arm tingling EXAM: CT HEAD WITHOUT CONTRAST TECHNIQUE: Contiguous axial images were obtained from the base of the skull through the vertex without intravenous contrast. COMPARISON:  04/16/2018 FINDINGS: Brain: No evidence of acute infarction, hemorrhage, hydrocephalus, extra-axial collection or mass lesion/mass effect. Subcortical white matter and periventricular small vessel ischemic changes. Vascular: Intracranial atherosclerosis. Skull: Normal. Negative for fracture or focal lesion. Sinuses/Orbits: The visualized paranasal sinuses are essentially clear. The mastoid air cells are unopacified. Other: None. IMPRESSION: No evidence of acute intracranial abnormality. Small vessel ischemic changes. Electronically Signed   By: Julian Hy M.D.   On: 03/27/2019 21:07   DG Chest Portable  1 View  Result Date: 03/27/2019 CLINICAL DATA:  Pt from home for left sided chest pain that started about 1.5 hrs ago while watching tv, radiates down his left arm with left arm numbness EXAM: PORTABLE CHEST 1 VIEW COMPARISON:  Chest radiograph 03/18/2019 FINDINGS: Stable cardiomediastinal contours with enlarged heart size. Left chest pacer remains in place. Low lung volumes with bronchovascular crowding. No new focal pulmonary opacity. No pneumothorax or large pleural effusion. No acute finding in the  visualized skeleton. IMPRESSION: Low lung volumes volumes with bronchovascular crowding. No definite new focal opacity. Electronically Signed   By: Audie Pinto M.D.   On: 03/27/2019 19:13   HEAR Score (for undifferentiated chest pain):  HEAR Score: 6    Assessment and Plan:  Gary [name] is a 86 y.o. male with PMH of CAD s/p NSTEMI in 2011 with occluded LCx with collaterals (medically managed), CHB s/p medtronic PPM, HTN, HLD, ?PAF in the setting of sepsis, OSA, and CKD stage 3, who presents with abdominal/chest pain  # Chest Pain Patient presents with chest pain, the second presentation in the last 10 days.  He is a somewhat poor historian and chest pain appears atypical in nature but he does have a history of coronary artery disease with an occluded circumflex with right to left collaterals and has been treated medically.  His last stress test was in 2016 and was a low risk test.  Given his history of coronary disease and his recurrent presentations to the emergency room due to chest pain, further work-up is warranted at this time despite the atypical description of the pain. -Trend troponin, ECGs -Nuclear stress test in the morning for further evaluation; can be compared to findings from 2016 -Continue medical management of coronary artery disease with aspirin 81 mg daily, statin  # HTN Blood pressure elevated at presentation -Continue home amlodipine 5 mg, Lasix 20 mg daily -Consider increase of amlodipine to 10 mg daily if blood pressure remains elevated     For questions or updates, please contact [place] Please consult [email or web address] for contact info under     Signed, Bryna Colander, MD  03/27/2019 10:26 PM

## 2019-03-28 ENCOUNTER — Observation Stay (HOSPITAL_BASED_OUTPATIENT_CLINIC_OR_DEPARTMENT_OTHER): Payer: Medicare PPO

## 2019-03-28 ENCOUNTER — Encounter (HOSPITAL_COMMUNITY): Payer: Self-pay | Admitting: Internal Medicine

## 2019-03-28 DIAGNOSIS — I10 Essential (primary) hypertension: Secondary | ICD-10-CM | POA: Diagnosis not present

## 2019-03-28 DIAGNOSIS — N1832 Chronic kidney disease, stage 3b: Secondary | ICD-10-CM | POA: Diagnosis not present

## 2019-03-28 DIAGNOSIS — R079 Chest pain, unspecified: Secondary | ICD-10-CM

## 2019-03-28 DIAGNOSIS — R739 Hyperglycemia, unspecified: Secondary | ICD-10-CM

## 2019-03-28 DIAGNOSIS — E785 Hyperlipidemia, unspecified: Secondary | ICD-10-CM

## 2019-03-28 DIAGNOSIS — I25110 Atherosclerotic heart disease of native coronary artery with unstable angina pectoris: Secondary | ICD-10-CM

## 2019-03-28 DIAGNOSIS — R0789 Other chest pain: Secondary | ICD-10-CM | POA: Diagnosis not present

## 2019-03-28 DIAGNOSIS — I2510 Atherosclerotic heart disease of native coronary artery without angina pectoris: Secondary | ICD-10-CM | POA: Diagnosis not present

## 2019-03-28 DIAGNOSIS — Z20822 Contact with and (suspected) exposure to covid-19: Secondary | ICD-10-CM | POA: Diagnosis not present

## 2019-03-28 DIAGNOSIS — I252 Old myocardial infarction: Secondary | ICD-10-CM | POA: Diagnosis not present

## 2019-03-28 LAB — NM MYOCAR MULTI W/SPECT W/WALL MOTION / EF
Estimated workload: 1 METS
Exercise duration (min): 0 min
Exercise duration (sec): 0 s
LV dias vol: 87 mL (ref 62–150)
LV sys vol: 48 mL
MPHR: 136 {beats}/min
Peak HR: 100 {beats}/min
Percent HR: 73 %
Rest HR: 91 {beats}/min
TID: 1.14

## 2019-03-28 LAB — GLUCOSE, CAPILLARY
Glucose-Capillary: 134 mg/dL — ABNORMAL HIGH (ref 70–99)
Glucose-Capillary: 132 mg/dL — ABNORMAL HIGH (ref 70–99)
Glucose-Capillary: 161 mg/dL — ABNORMAL HIGH (ref 70–99)
Glucose-Capillary: 153 mg/dL — ABNORMAL HIGH (ref 70–99)
Glucose-Capillary: 144 mg/dL — ABNORMAL HIGH (ref 70–99)

## 2019-03-28 LAB — URINALYSIS, ROUTINE W REFLEX MICROSCOPIC
Bacteria, UA: NONE SEEN
Bilirubin Urine: NEGATIVE
Glucose, UA: NEGATIVE mg/dL
Hgb urine dipstick: NEGATIVE
Ketones, ur: NEGATIVE mg/dL
Leukocytes,Ua: NEGATIVE
Nitrite: NEGATIVE
Protein, ur: 100 mg/dL — AB
Specific Gravity, Urine: 1.02 (ref 1.005–1.030)
pH: 6 (ref 5.0–8.0)

## 2019-03-28 LAB — AMMONIA: Ammonia: 20 umol/L (ref 9–35)

## 2019-03-28 LAB — VITAMIN B12: Vitamin B-12: 113 pg/mL — ABNORMAL LOW (ref 180–914)

## 2019-03-28 LAB — SARS CORONAVIRUS 2 (TAT 6-24 HRS): SARS Coronavirus 2: NEGATIVE

## 2019-03-28 LAB — TSH: TSH: 1.817 u[IU]/mL (ref 0.350–4.500)

## 2019-03-28 LAB — HEMOGLOBIN A1C
Hgb A1c MFr Bld: 7.4 % — ABNORMAL HIGH (ref 4.8–5.6)
Mean Plasma Glucose: 165.68 mg/dL

## 2019-03-28 MED ORDER — TECHNETIUM TC 99M TETROFOSMIN IV KIT
30.3000 | PACK | Freq: Once | INTRAVENOUS | Status: AC | PRN
  Administered 2019-03-28: 30.3 via INTRAVENOUS

## 2019-03-28 MED ORDER — ONDANSETRON HCL 4 MG/2ML IJ SOLN
4.0000 mg | Freq: Four times a day (QID) | INTRAMUSCULAR | Status: DC | PRN
  Administered 2019-03-28 (×2): 4 mg via INTRAVENOUS
  Filled 2019-03-28 (×2): qty 2

## 2019-03-28 MED ORDER — CYANOCOBALAMIN 1000 MCG/ML IJ SOLN
1000.0000 ug | Freq: Once | INTRAMUSCULAR | Status: AC
  Administered 2019-03-28: 1000 ug via INTRAMUSCULAR
  Filled 2019-03-28: qty 1

## 2019-03-28 MED ORDER — REGADENOSON 0.4 MG/5ML IV SOLN: 0.4000 mg | Freq: Once | INTRAVENOUS | Status: AC

## 2019-03-28 MED ORDER — PNEUMOCOCCAL VAC POLYVALENT 25 MCG/0.5ML IJ INJ
0.5000 mL | INJECTION | INTRAMUSCULAR | Status: AC
  Administered 2019-03-29: 0.5 mL via INTRAMUSCULAR

## 2019-03-28 MED ORDER — INFLUENZA VAC A&B SA ADJ QUAD 0.5 ML IM PRSY
0.5000 mL | PREFILLED_SYRINGE | INTRAMUSCULAR | Status: AC
  Administered 2019-03-29: 0.5 mL via INTRAMUSCULAR
  Filled 2019-03-28: qty 0.5

## 2019-03-28 MED ORDER — REGADENOSON 0.4 MG/5ML IV SOLN
INTRAVENOUS | Status: AC
  Administered 2019-03-28: 0.4 mg via INTRAVENOUS
  Filled 2019-03-28: qty 5

## 2019-03-28 MED ORDER — TECHNETIUM TC 99M TETROFOSMIN IV KIT
9.3300 | PACK | Freq: Once | INTRAVENOUS | Status: AC | PRN
  Administered 2019-03-28: 9.33 via INTRAVENOUS

## 2019-03-28 NOTE — Progress Notes (Addendum)
   Lexiscan Myoview was intermediate risk with 2 fixed perfusion defects felt to be consistent with prior MI. Reviewed results with Dr. Sallyanne Kuster. No further cardiac work-up needed at this time. Patient already has an office visit with our office scheduled for 03/31/2019 with Roby Lofts, PA-C.   Darreld Mclean, PA-C 03/28/2019 5:41 PM

## 2019-03-28 NOTE — Progress Notes (Signed)
Progress Note    Gary [name]  EXH:[id number] DOB: [birth year]  DOA: 03/27/2019 PCP: Minette Brine, FNP    Brief Narrative:     Medical records reviewed and are as summarized below:  Gary [name] is an 86 y.o. male  with medical history significant of CAD status post NSTEMI in 2011 with occluded LCx with collaterals (medically managed), CHB status post Medtronic PPM, hypertension, hyperlipidemia, OSA, CKD stage III presenting to the ED via EMS for evaluation of chest pain.  Patient took 2 sublingual nitroglycerin tablets and aspirin 325 mg at home.  Upon EMS arrival, chest pain-free and vital signs stable.  Patient denies having any chest pain.  States he called EMS to get his blood sugar and everything else checked.  Also at the time he felt that his entire body was warm and he was sweating.  No nausea or vomiting.  He continued to deny any chest pain/pressure/discomfort.  Unclear if he had any dyspnea.   Assessment/Plan:   Principal Problem:   Chest pain Active Problems:   Essential hypertension   Dyslipidemia   CKD (chronic kidney disease), stage III (HCC)   Chronic kidney disease, stage 3   Chest pain in the setting of known history of CAD Cardiology consult appreciated Nuclear medicine stress test pending results  Hypertension -titrate meds as able  Hyperglycemia -hgbA1c: 7.4 -education on carb mod diet -monitor sugars, may need oral medication started -Sugars look good but patient unfortunately n.p.o. so I suspect they will raise once he starts eating  CKD stage III -Stable.  Renal function at baseline.  Hyperlipidemia -Continue home Zocor  Low B12 -replace  obesity Body mass index is 34.01 kg/m.   Family Communication/Anticipated D/C date and plan/Code Status   DVT prophylaxis: Heparin Code Status: Full Code.  Family Communication:  Disposition Plan: Await nuclear medicine studies as well as monitoring of blood sugars   Medical  Consultants:    Cardiology     Subjective:   No current chest pain  Objective:    Vitals:   03/28/19 1050 03/28/19 1052 03/28/19 1054 03/28/19 1155  BP: 134/66 137/72 (!) 171/88 139/63  Pulse:    89  Resp:      Temp:      TempSrc:      SpO2:    96%  Weight:      Height:        Intake/Output Summary (Last 24 hours) at 03/28/2019 1237 Last data filed at 03/28/2019 0330 Gross per 24 hour  Intake --  Output 175 ml  Net -175 ml   Filed Weights   03/28/19 0130  Weight: 92.7 kg    Exam: Up walking in room with nursing Regular rate and rhythm No increased work of breathing Appears frail and is hard of hearing  Data Reviewed:   I have personally reviewed following labs and imaging studies:  Labs: Labs show the following:   Basic Metabolic Panel: Recent Labs  Lab 03/27/19 1847  NA 139  K 4.4  CL 102  CO2 23  GLUCOSE 237*  BUN 21  CREATININE 1.72*  CALCIUM 9.4   GFR Estimated Creatinine Clearance: 33.5 mL/min (A) (by C-G formula based on SCr of 1.72 mg/dL (H)). Liver Function Tests: Recent Labs  Lab 03/27/19 1847  AST 20  ALT 14  ALKPHOS 34*  BILITOT 0.6  PROT 6.7  ALBUMIN 4.2   Recent Labs  Lab 03/27/19 1847  LIPASE 27   Recent Labs  Lab  03/28/19 0133  AMMONIA 20   Coagulation profile No results for input(s): INR, PROTIME in the last 168 hours.  CBC: Recent Labs  Lab 03/27/19 1847  WBC 7.5  NEUTROABS 6.0  HGB 14.0  HCT 43.6  MCV 93.0  PLT 161   Cardiac Enzymes: No results for input(s): CKTOTAL, CKMB, CKMBINDEX, TROPONINI in the last 168 hours. BNP (last 3 results) No results for input(s): PROBNP in the last 8760 hours. CBG: Recent Labs  Lab 03/27/19 1830 03/28/19 0457 03/28/19 0613 03/28/19 1151  GLUCAP 222* 134* 132* 161*   D-Dimer: No results for input(s): DDIMER in the last 72 hours. Hgb A1c: Recent Labs    03/28/19 0133  HGBA1C 7.4*   Lipid Profile: No results for input(s): CHOL, HDL, LDLCALC, TRIG,  CHOLHDL, LDLDIRECT in the last 72 hours. Thyroid function studies: Recent Labs    03/28/19 0133  TSH 1.817   Anemia work up: Recent Labs    03/28/19 0133  VITAMINB12 113*   Sepsis Labs: Recent Labs  Lab 03/27/19 1847  WBC 7.5    Microbiology Recent Results (from the past 240 hour(s))  SARS CORONAVIRUS 2 (TAT 6-24 HRS) Nasopharyngeal Nasopharyngeal Swab     Status: None   Collection Time: 03/28/19 12:41 AM   Specimen: Nasopharyngeal Swab  Result Value Ref Range Status   SARS Coronavirus 2 NEGATIVE NEGATIVE Final    Comment: (NOTE) SARS-CoV-2 target nucleic acids are NOT DETECTED. The SARS-CoV-2 RNA is generally detectable in upper and lower respiratory specimens during the acute phase of infection. Negative results do not preclude SARS-CoV-2 infection, do not rule out co-infections with other pathogens, and should not be used as the sole basis for treatment or other patient management decisions. Negative results must be combined with clinical observations, patient history, and epidemiological information. The expected result is Negative. Fact Sheet for Patients: SugarRoll.be Fact Sheet for Healthcare Providers: https://www.woods-mathews.com/ This test is not yet approved or cleared by the [place] FDA and  has been authorized for detection and/or diagnosis of SARS-CoV-2 by FDA under an Emergency Use Authorization (EUA). This EUA will remain  in effect (meaning this test can be used) for the duration of the COVID-19 declaration under Section 56 4(b)(1) of the Act, 21 U.S.C. section 360bbb-3(b)(1), unless the authorization is terminated or revoked sooner. Performed at [facility], [place] [address], [place], [place] [address]     Procedures and diagnostic studies:  CT Head Wo Contrast  Result Date: 03/27/2019 CLINICAL DATA:  Subacute left arm tingling EXAM: CT HEAD WITHOUT CONTRAST TECHNIQUE: Contiguous axial  images were obtained from the base of the skull through the vertex without intravenous contrast. COMPARISON:  04/16/2018 FINDINGS: Brain: No evidence of acute infarction, hemorrhage, hydrocephalus, extra-axial collection or mass lesion/mass effect. Subcortical white matter and periventricular small vessel ischemic changes. Vascular: Intracranial atherosclerosis. Skull: Normal. Negative for fracture or focal lesion. Sinuses/Orbits: The visualized paranasal sinuses are essentially clear. The mastoid air cells are unopacified. Other: None. IMPRESSION: No evidence of acute intracranial abnormality. Small vessel ischemic changes. Electronically Signed   By: Julian Hy M.D.   On: 03/27/2019 21:07   DG Chest Portable 1 View  Result Date: 03/27/2019 CLINICAL DATA:  Pt from home for left sided chest pain that started about 1.5 hrs ago while watching tv, radiates down his left arm with left arm numbness EXAM: PORTABLE CHEST 1 VIEW COMPARISON:  Chest radiograph 03/18/2019 FINDINGS: Stable cardiomediastinal contours with enlarged heart size. Left chest pacer remains in place.  Low lung volumes with bronchovascular crowding. No new focal pulmonary opacity. No pneumothorax or large pleural effusion. No acute finding in the visualized skeleton. IMPRESSION: Low lung volumes volumes with bronchovascular crowding. No definite new focal opacity. Electronically Signed   By: Audie Pinto M.D.   On: 03/27/2019 19:13    Medications:   . amLODipine  5 mg Oral Daily  . aspirin EC  81 mg Oral Daily  . citalopram  40 mg Oral Daily  . doxazosin  4 mg Oral Daily  . fluticasone  2 spray Each Nare Daily  . furosemide  20 mg Oral QODAY  . gabapentin  400 mg Oral TID  . heparin  5,000 Units Subcutaneous Q8H  . [START ON 03/29/2019] influenza vaccine adjuvanted  0.5 mL Intramuscular Tomorrow-1000  . insulin aspart  0-5 Units Subcutaneous QHS  . insulin aspart  0-9 Units Subcutaneous TID WC  . [START ON 03/29/2019]  pneumococcal 23 valent vaccine  0.5 mL Intramuscular Tomorrow-1000  . simvastatin  40 mg Oral QHS  . tamsulosin  0.4 mg Oral QPC supper  . traMADol-acetaminophen  2 tablet Oral Q8H   Continuous Infusions:   LOS: 0 days   Geradine Girt  [facility]   How to contact the [facility] Attending or Consulting provider [place] or covering provider during after hours [place], for this patient?  1. Check the care team in [facility] and look for a) attending/consulting [facility] provider listed and b) the [facility] team listed 2. Log into [email or web address] and use [place] universal password to access. If you do not have the password, please contact the hospital operator. 3. Locate the [facility] provider you are looking for under [facility] and page to a number that you can be directly reached. 4. If you still have difficulty reaching the provider, please page the [facility] (Director on Call) for the Hospitalists listed on amion for assistance.  03/28/2019, 12:37 PM

## 2019-03-28 NOTE — Progress Notes (Signed)
Updated spouse Mardene Celeste - see demographics) on patient condition.

## 2019-03-28 NOTE — Progress Notes (Addendum)
Progress Note  Patient Name: Gary [name] Date of Encounter: 03/28/2019  Primary Cardiologist:  Pixie Casino, MD  Subjective   Pt has been having episodes at rest where he gets a hot flash that is very painful (including in his chest), it can last for a long time, treats with pain meds. Started after he had shingles.  No hx exertional chest pain.   Inpatient Medications    Scheduled Meds: . amLODipine  5 mg Oral Daily  . aspirin EC  81 mg Oral Daily  . citalopram  40 mg Oral Daily  . doxazosin  4 mg Oral Daily  . fluticasone  2 spray Each Nare Daily  . furosemide  20 mg Oral QODAY  . gabapentin  400 mg Oral TID  . heparin  5,000 Units Subcutaneous Q8H  . [START ON 03/29/2019] influenza vaccine adjuvanted  0.5 mL Intramuscular Tomorrow-1000  . insulin aspart  0-5 Units Subcutaneous QHS  . insulin aspart  0-9 Units Subcutaneous TID WC  . [START ON 03/29/2019] pneumococcal 23 valent vaccine  0.5 mL Intramuscular Tomorrow-1000  . simvastatin  40 mg Oral QHS  . tamsulosin  0.4 mg Oral QPC supper  . traMADol-acetaminophen  2 tablet Oral Q8H   Continuous Infusions:  PRN Meds: acetaminophen, nitroGLYCERIN, ondansetron (ZOFRAN) IV   Vital Signs    Vitals:   03/27/19 2230 03/27/19 2300 03/28/19 0130 03/28/19 0322  BP: (!) 164/83 (!) 170/127 (!) 171/99 (!) 178/83  Pulse: 86 88 (!) 108 93  Resp: 20  16 14   Temp:   98.2 F (36.8 C) 99.2 F (37.3 C)  TempSrc:   Oral Oral  SpO2: 97% 97% 98% 98%  Weight:   92.7 kg   Height:   5\' 5"  (1.651 m)     Intake/Output Summary (Last 24 hours) at 03/28/2019 0855 Last data filed at 03/28/2019 0330 Gross per 24 hour  Intake -  Output 175 ml  Net -175 ml   Filed Weights   03/28/19 0130  Weight: 92.7 kg   Last Weight  Most recent update: 03/28/2019  1:59 AM   Weight  92.7 kg (204 lb 5.9 oz)           Weight change:    Telemetry    SR, Vpaced - Personally Reviewed  ECG    SR, V paced - Personally Reviewed  Physical  Exam   General: Well developed, well nourished, male appearing in no acute distress. Head: Normocephalic, atraumatic.  Neck: Supple without bruits, JVD not elevated. Lungs:  Resp regular and unlabored, CTA. Heart: RRR, S1, S2, no S3, S4, or murmur; no rub. Abdomen: Soft, non-tender, non-distended with normoactive bowel sounds. No hepatomegaly. No rebound/guarding. No obvious abdominal masses. Extremities: No clubbing, cyanosis, no edema. Distal pedal pulses are 2+ bilaterally. Neuro: Alert and oriented X 3. Moves all extremities spontaneously. Psych: Normal affect.  Labs    Hematology Recent Labs  Lab 03/27/19 1847  WBC 7.5  RBC 4.69  HGB 14.0  HCT 43.6  MCV 93.0  MCH 29.9  MCHC 32.1  RDW 12.4  PLT 161    Chemistry Recent Labs  Lab 03/27/19 1847  NA 139  K 4.4  CL 102  CO2 23  GLUCOSE 237*  BUN 21  CREATININE 1.72*  CALCIUM 9.4  PROT 6.7  ALBUMIN 4.2  AST 20  ALT 14  ALKPHOS 34*  BILITOT 0.6  GFRNONAA 36*  GFRAA 41*  ANIONGAP 14     High Sensitivity  Troponin:   Recent Labs  Lab 03/18/19 0906 03/18/19 1220 03/27/19 1847 03/27/19 2109  TROPONINIHS 35* 31* 24* 30*      BNPNo results for input(s): BNP, PROBNP in the last 168 hours.   DDimer No results for input(s): DDIMER in the last 168 hours.   Lab Results  Component Value Date   CHOL  08/09/2009    164        ATP III CLASSIFICATION:  <200     mg/dL   Desirable  200-239  mg/dL   Borderline High  >=240    mg/dL   High          HDL 36 (L) 08/09/2009   LDLCALC (H) 08/09/2009    105        Total Cholesterol/HDL:CHD Risk Coronary Heart Disease Risk Table                     Men   Women  1/2 Average Risk   3.4   3.3  Average Risk       5.0   4.4  2 X Average Risk   9.6   7.1  3 X Average Risk  23.4   11.0        Use the calculated Patient Ratio above and the CHD Risk Table to determine the patient's CHD Risk.        ATP III CLASSIFICATION (LDL):  <100     mg/dL   Optimal  100-129   mg/dL   Near or Above                    Optimal  130-159  mg/dL   Borderline  160-189  mg/dL   High  >190     mg/dL   Very High   TRIG 114 08/09/2009   CHOLHDL 4.6 08/09/2009     Radiology    CT Head Wo Contrast  Result Date: 03/27/2019 CLINICAL DATA:  Subacute left arm tingling EXAM: CT HEAD WITHOUT CONTRAST TECHNIQUE: Contiguous axial images were obtained from the base of the skull through the vertex without intravenous contrast. COMPARISON:  04/16/2018 FINDINGS: Brain: No evidence of acute infarction, hemorrhage, hydrocephalus, extra-axial collection or mass lesion/mass effect. Subcortical white matter and periventricular small vessel ischemic changes. Vascular: Intracranial atherosclerosis. Skull: Normal. Negative for fracture or focal lesion. Sinuses/Orbits: The visualized paranasal sinuses are essentially clear. The mastoid air cells are unopacified. Other: None. IMPRESSION: No evidence of acute intracranial abnormality. Small vessel ischemic changes. Electronically Signed   By: Julian Hy M.D.   On: 03/27/2019 21:07   DG Chest Portable 1 View  Result Date: 03/27/2019 CLINICAL DATA:  Pt from home for left sided chest pain that started about 1.5 hrs ago while watching tv, radiates down his left arm with left arm numbness EXAM: PORTABLE CHEST 1 VIEW COMPARISON:  Chest radiograph 03/18/2019 FINDINGS: Stable cardiomediastinal contours with enlarged heart size. Left chest pacer remains in place. Low lung volumes with bronchovascular crowding. No new focal pulmonary opacity. No pneumothorax or large pleural effusion. No acute finding in the visualized skeleton. IMPRESSION: Low lung volumes volumes with bronchovascular crowding. No definite new focal opacity. Electronically Signed   By: Audie Pinto M.D.   On: 03/27/2019 19:13     Cardiac Studies   MV ordered   Patient Profile     86 y.o. male w/ hx NSTEMI in 2011 with occluded LCx with collaterals (medically managed), CHB  status post Medtronic PPM, hypertension, hyperlipidemia,  OSA, CKD stage III, was admitted 02/13 with chest pain.  Assessment & Plan    1. Chest pain - CFX known occluded w/ collaterals, no hx exertional chest pain - CP was associated w/ his episodes of all-over pain and strong flushed feeling, has had ever since he had shingles - Trop mildly elevated w/ flat trend - with CKD III, Cr chronically elevated, wish to avoid dye studies if possible - MV today, f/u on results  Otherwise, per IM Principal Problem:   Chest pain Active Problems:   Essential hypertension   Dyslipidemia   CKD (chronic kidney disease), stage III (HCC)   Chronic kidney disease, stage 3    Signed, Rosaria Ferries , PA-C 8:55 AM 03/28/2019 Pager: [phone]  I have seen and examined the patient along with Rosaria Ferries , PA-C.  I have reviewed the chart, notes and new data.  I agree with PA's note.  Key new complaints: chest symptoms are atypical, but this is his second visit this month. Known CAD. Key examination changes: normal CV exam Key new findings / data: mildly elevated hsTrop, "plateau" pattern, not c/w acute atherothrombotic event. ECG non diagnostic (pacemaker dependent). Creat c/w CKD 3b, stable.  PLAN: Recommend Lexiscan Myoview. Angiography is not a trivial risk procedure due to CKD, would only proceed with cath if there is a moderate or large area of reversible ischemia.  Sanda Klein, MD, [place] [phone] 03/28/2019, 9:15 AM

## 2019-03-28 NOTE — Progress Notes (Signed)
   Patient presented for a nuclear stress test today. No immediate complications. Stress imaging is pending at this time.   Preliminary EKG findings may be listed in the chart, but the stress test result will not be finalized until perfusion imaging is complete.  Darreld Mclean, PA-C 03/28/2019 11:05 AM

## 2019-03-28 NOTE — Progress Notes (Signed)
On assessment patient was somewhat difficult to rouse. When woken he reported that he was "having trouble staying awake". Assessed O2 saturation, patient was fluctuating between 92-94%. Started patient on O2 via nasal canula at 2L/min and weaned based on O2 saturation response. Patient is currently on 0.5L/min and saturating between 95-97%. He is more alert with oxygen. Will continue to assess necessity of oxygen therapy. Provider notified.

## 2019-03-29 ENCOUNTER — Encounter: Payer: Self-pay | Admitting: Nurse Practitioner

## 2019-03-29 DIAGNOSIS — R739 Hyperglycemia, unspecified: Secondary | ICD-10-CM | POA: Diagnosis not present

## 2019-03-29 DIAGNOSIS — R079 Chest pain, unspecified: Secondary | ICD-10-CM | POA: Diagnosis not present

## 2019-03-29 DIAGNOSIS — I10 Essential (primary) hypertension: Secondary | ICD-10-CM | POA: Diagnosis not present

## 2019-03-29 LAB — CBC
HCT: 42.5 % (ref 39.0–52.0)
Hemoglobin: 13.7 g/dL (ref 13.0–17.0)
MCH: 29.8 pg (ref 26.0–34.0)
MCHC: 32.2 g/dL (ref 30.0–36.0)
MCV: 92.4 fL (ref 80.0–100.0)
Platelets: 175 10*3/uL (ref 150–400)
RBC: 4.6 MIL/uL (ref 4.22–5.81)
RDW: 12.6 % (ref 11.5–15.5)
WBC: 7.2 10*3/uL (ref 4.0–10.5)
nRBC: 0 % (ref 0.0–0.2)

## 2019-03-29 LAB — BASIC METABOLIC PANEL
Anion gap: 14 (ref 5–15)
BUN: 27 mg/dL — ABNORMAL HIGH (ref 8–23)
CO2: 27 mmol/L (ref 22–32)
Calcium: 9 mg/dL (ref 8.9–10.3)
Chloride: 98 mmol/L (ref 98–111)
Creatinine, Ser: 2.07 mg/dL — ABNORMAL HIGH (ref 0.61–1.24)
GFR calc Af Amer: 33 mL/min — ABNORMAL LOW (ref >60)
GFR calc non Af Amer: 29 mL/min — ABNORMAL LOW (ref >60)
Glucose, Bld: 134 mg/dL — ABNORMAL HIGH (ref 70–99)
Potassium: 4 mmol/L (ref 3.5–5.1)
Sodium: 139 mmol/L (ref 135–145)

## 2019-03-29 LAB — GLUCOSE, CAPILLARY
Glucose-Capillary: 138 mg/dL — ABNORMAL HIGH (ref 70–99)
Glucose-Capillary: 141 mg/dL — ABNORMAL HIGH (ref 70–99)

## 2019-03-29 MED ORDER — GABAPENTIN 100 MG PO CAPS: 200.0000 mg | ORAL_CAPSULE | Freq: Every day | ORAL | Status: DC

## 2019-03-29 MED ORDER — LIVING WELL WITH DIABETES BOOK
Freq: Once | Status: AC
  Filled 2019-03-29: qty 1

## 2019-03-29 MED ORDER — GABAPENTIN 100 MG PO CAPS: 400.0000 mg | ORAL_CAPSULE | Freq: Three times a day (TID) | ORAL | 0 refills | Status: DC

## 2019-03-29 MED ORDER — CYANOCOBALAMIN 1000 MCG PO TABS: 1000.0000 ug | ORAL_TABLET | Freq: Every day | ORAL | Status: DC

## 2019-03-29 MED ORDER — BLOOD GLUCOSE MONITOR KIT: PACK | 0 refills | Status: DC

## 2019-03-29 MED ORDER — VITAMIN B-12 1000 MCG PO TABS
1000.0000 ug | ORAL_TABLET | Freq: Every day | ORAL | Status: DC
  Administered 2019-03-29: 1000 ug via ORAL
  Filled 2019-03-29: qty 1

## 2019-03-29 NOTE — Discharge Summary (Signed)
Physician Discharge Summary  Gary [name]:[id number] DOB: 1932-09-18 DOA: 03/27/2019  PCP: Minette Brine, FNP  Admit date: 03/27/2019 Discharge date: 03/29/2019  Admitted From: Home Discharge disposition: Home   Recommendations for Outpatient Follow-Up:   1. Patient declined home health, prefers to go to outpatient therapy: Will place referral 2. MD in 2 days at cardiology follow-up 3. Patient to learn how to check blood sugars and keep a log to bring to PCP 4. Patient also instructed on carb mod diet 5. We will defer to PCP starting of any medication for hyperglycemia 6. Monitor B12 level   Discharge Diagnosis:   Principal Problem:   Chest pain Active Problems:   Essential hypertension   Dyslipidemia   CKD (chronic kidney disease), stage III (HCC)   Chronic kidney disease, stage 3    Discharge Condition: Improved.  Diet recommendation: Low sodium, heart healthy.  Carbohydrate-modified.   Wound care: None.  Code status: Full.   History of Present Illness:   Gary [name] is a 86 y.o. male with medical history significant of CAD status post NSTEMI in 2011 with occluded LCx with collaterals (medically managed), CHB status post Medtronic PPM, hypertension, hyperlipidemia, OSA, CKD stage III presenting to the ED via EMS for evaluation of chest pain.  Patient took 2 sublingual nitroglycerin tablets and aspirin 325 mg at home.  Upon EMS arrival, chest pain-free and vital signs stable.  Patient denies having any chest pain.  States he called EMS to get his blood sugar and everything else checked.  Also at the time he felt that his entire body was warm and he was sweating.  No nausea or vomiting.  He continued to deny any chest pain/pressure/discomfort.  Unclear if he had any dyspnea.  Patient was digressing a lot and no additional history could be obtained from him.    Hospital Course by Problem:   Chest pain in the setting of known history of CAD Cardiology  consult appreciated:  -continue ASA, norvasc '5mg'$  - no plavix in the setting of hx of rectal bleeding - no BB with CHB Nuclear medicine stress test similar to prior  Hypertension -titrate meds as able  Hyperglycemia -hgbA1c: 7.4 -education on carb mod diet -monitor sugars, may need oral medication started-we will prescribe patient a glucometer -Diabetic coordinator consult to educate patient  CKD stage III -Stable. Renal function at baseline.  Hyperlipidemia -Continue home Zocor  Low B12 -replace p.o. and monitor outpatient  obesity Estimated body mass index is 33.78 kg/m as calculated from the following:   Height as of this encounter: '5\' 5"'$  (1.651 m).   Weight as of this encounter: 92.1 kg.    Medical Consultants:   Cardiology   Discharge Exam:    03/29/19 0404 03/29/19 0405 03/29/19 0755  BP: 136/69  120/65  Pulse: 74  73  Resp: 20    Temp:   99.1 F (37.3 C)  TempSrc:   Oral  SpO2: 96%  94%  Weight:  92.1 kg   Height:       General exam: Appears calm and comfortable.    The results of significant diagnostics from this hospitalization (including imaging, microbiology, ancillary and laboratory) are listed below for reference.     Procedures and Diagnostic Studies:   CT Head Wo Contrast  Result Date: 03/27/2019 CLINICAL DATA:  Subacute left arm tingling EXAM: CT HEAD WITHOUT CONTRAST TECHNIQUE: Contiguous axial images were obtained from the base of the skull through the vertex  without intravenous contrast. COMPARISON:  04/16/2018 FINDINGS: Brain: No evidence of acute infarction, hemorrhage, hydrocephalus, extra-axial collection or mass lesion/mass effect. Subcortical white matter and periventricular small vessel ischemic changes. Vascular: Intracranial atherosclerosis. Skull: Normal. Negative for fracture or focal lesion. Sinuses/Orbits: The visualized paranasal sinuses are essentially clear. The mastoid air cells are unopacified. Other: None.  IMPRESSION: No evidence of acute intracranial abnormality. Small vessel ischemic changes. Electronically Signed   By: Julian Hy M.D.   On: 03/27/2019 21:07   NM Myocar Multi W/Spect W/Wall Motion / EF  Result Date: 03/28/2019  There was no ST segment deviation noted during stress.  No T wave inversion was noted during stress.  This is an intermediate risk study.  The left ventricular ejection fraction is moderately decreased (30-44%).  Nuclear stress EF: 44%.  Findings consistent with prior myocardial infarction.  1.  There are 2 fixed perfusion defects. A severe, medium size (10% of LV), fixed perfusion defect is present in the mid anteroseptum, extending into a apical septum/apex consistent with prior infarction.  This defect could also be artifact due to ventricular pacing.  A severe, medium size (12% of LV), fixed perfusion defect is present in the lateral wall, basal to mid segments consistent with prior infarction.  No reversible ischemia observed.  The lateral wall defect was reported on the prior study from 01/13/2015. 2.  Moderately reduced left ventricular function, EF 44%. 3.  Global hypokinesis. 4.  This is an intermediate risk study. [place] [facility]. [facility], [place] [address] [place], [place] [address] [phone] 12:57 PM  DG Chest Portable 1 View  Result Date: 03/27/2019 CLINICAL DATA:  Pt from home for left sided chest pain that started about 1.5 hrs ago while watching tv, radiates down his left arm with left arm numbness EXAM: PORTABLE CHEST 1 VIEW COMPARISON:  Chest radiograph 03/18/2019 FINDINGS: Stable cardiomediastinal contours with enlarged heart size. Left chest pacer remains in place. Low lung volumes with bronchovascular crowding. No new focal pulmonary opacity. No pneumothorax or large pleural effusion. No acute finding in the visualized skeleton. IMPRESSION: Low lung volumes volumes with bronchovascular crowding. No definite new focal  opacity. Electronically Signed   By: Audie Pinto M.D.   On: 03/27/2019 19:13     Labs:   Basic Metabolic Panel: Recent Labs  Lab 03/27/19 1847 03/29/19 0444  NA 139 139  K 4.4 4.0  CL 102 98  CO2 23 27  GLUCOSE 237* 134*  BUN 21 27*  CREATININE 1.72* 2.07*  CALCIUM 9.4 9.0   GFR Estimated Creatinine Clearance: 27.7 mL/min (A) (by C-G formula based on SCr of 2.07 mg/dL (H)). Liver Function Tests: Recent Labs  Lab 03/27/19 1847  AST 20  ALT 14  ALKPHOS 34*  BILITOT 0.6  PROT 6.7  ALBUMIN 4.2   Recent Labs  Lab 03/27/19 1847  LIPASE 27   Recent Labs  Lab 03/28/19 0133  AMMONIA 20   Coagulation profile No results for input(s): INR, PROTIME in the last 168 hours.  CBC: Recent Labs  Lab 03/27/19 1847 03/29/19 0444  WBC 7.5 7.2  NEUTROABS 6.0  --   HGB 14.0 13.7  HCT 43.6 42.5  MCV 93.0 92.4  PLT 161 175   Cardiac Enzymes: No results for input(s): CKTOTAL, CKMB, CKMBINDEX, TROPONINI in the last 168 hours. BNP: Invalid input(s): POCBNP CBG: Recent Labs  Lab 03/28/19 1151 03/28/19 1632 03/28/19 2143 03/29/19 0650 03/29/19 1131  GLUCAP 161* 153* 144*  138* 141*   D-Dimer No results for input(s): DDIMER in the last 72 hours. Hgb A1c Recent Labs    03/28/19 0133  HGBA1C 7.4*   Lipid Profile No results for input(s): CHOL, HDL, LDLCALC, TRIG, CHOLHDL, LDLDIRECT in the last 72 hours. Thyroid function studies Recent Labs    03/28/19 0133  TSH 1.817   Anemia work up Recent Labs    03/28/19 0133  VITAMINB12 113*   Microbiology Recent Results (from the past 240 hour(s))  SARS CORONAVIRUS 2 (TAT 6-24 HRS) Nasopharyngeal Nasopharyngeal Swab     Status: None   Collection Time: 03/28/19 12:41 AM   Specimen: Nasopharyngeal Swab  Result Value Ref Range Status   SARS Coronavirus 2 NEGATIVE NEGATIVE Final    Comment: (NOTE) SARS-CoV-2 target nucleic acids are NOT DETECTED. The SARS-CoV-2 RNA is generally detectable in upper and  lower respiratory specimens during the acute phase of infection. Negative results do not preclude SARS-CoV-2 infection, do not rule out co-infections with other pathogens, and should not be used as the sole basis for treatment or other patient management decisions. Negative results must be combined with clinical observations, patient history, and epidemiological information. The expected result is Negative. Fact Sheet for Patients: SugarRoll.be Fact Sheet for Healthcare Providers: https://www.woods-mathews.com/ This test is not yet approved or cleared by the [place] FDA and  has been authorized for detection and/or diagnosis of SARS-CoV-2 by FDA under an Emergency Use Authorization (EUA). This EUA will remain  in effect (meaning this test can be used) for the duration of the COVID-19 declaration under Section 56 4(b)(1) of the Act, 21 U.S.C. section 360bbb-3(b)(1), unless the authorization is terminated or revoked sooner. Performed at [facility], [place] [address], [place], [place] [address]      Discharge Instructions:   Discharge Instructions    Ambulatory referral to Nutrition and Diabetic Education   Complete by: As directed    New DM dx; A1C 7.4%   Diet - low sodium heart healthy   Complete by: As directed    Diet Carb Modified   Complete by: As directed    Discharge instructions   Complete by: As directed    Glucometer (can get from [employer] - [employer] brand for $30) Bring log of blood sugars to PCP Will need follow up lab work: BMP with cardiology, B12 in 6 weeks with PCP   Increase activity slowly   Complete by: As directed      Allergies as of 03/29/2019      Reactions   Naproxen Rash   Clopidogrel Bisulfate Other (See Comments)   H/O diverticulitis; prone to rectal bleeding.  Plavix complicated this.  jkl   Sulfonamide Derivatives Hives   Other Other (See Comments)   Narcotics-C. Diff   Latex Rash   Tape  Itching, Rash   Latex-Adhesive tape      Medication List    STOP taking these medications   AMBULATORY NON FORMULARY MEDICATION     TAKE these medications   acetaminophen 500 MG tablet Commonly known as: TYLENOL Take 1,000 mg by mouth every 8 (eight) hours as needed for fever.   amLODipine 5 MG tablet Commonly known as: NORVASC Take 1 tablet (5 mg total) by mouth daily.   aspirin EC 81 MG tablet Take 81 mg by mouth daily.   blood glucose meter kit and supplies Kit Dispense based on patient and insurance preference. Use up to four times daily as directed. (FOR ICD-9 250.00, 250.01).  citalopram 40 MG tablet Commonly known as: CELEXA Take 40 mg by mouth daily.   cyanocobalamin 1000 MCG tablet Take 1 tablet (1,000 mcg total) by mouth daily.   doxazosin 4 MG tablet Commonly known as: CARDURA Take 4 mg by mouth daily.   Flonase 50 MCG/ACT nasal spray Generic drug: fluticasone Place 2 sprays into both nostrils daily.   furosemide 20 MG tablet Commonly known as: LASIX Take 1 tablet (20 mg total) by mouth every other day.   gabapentin 100 MG capsule Commonly known as: NEURONTIN Take 4 capsules (400 mg total) by mouth 3 (three) times daily. What changed: medication strength   nitroGLYCERIN 0.4 MG SL tablet Commonly known as: NITROSTAT PLACE 1 TABLET UNDER THE TONGUE EVERY 5 MINUTES AS NEEDED FOR CHEST PAIN FOR 3 DOSES What changed: See the new instructions.   simvastatin 40 MG tablet Commonly known as: ZOCOR TAKE 1 TABLET BY MOUTH AT BEDTIME   tamsulosin 0.4 MG Caps capsule Commonly known as: FLOMAX Take 0.4 mg by mouth daily after supper.   traMADol-acetaminophen 37.5-325 MG tablet Commonly known as: ULTRACET Take 2 tablets by mouth 3 (three) times daily.      Follow-up Information    Minette Brine, FNP.   Specialty: General Practice Why: Left message office will call patient Contact information: [address] [place]  [address] [address] Follow up on 04/06/2019.   Why: @ 2:00 pm for outpatient physical therapy- please arrive at 1:45 for the appointment.  Contact information: [address] [place] [address] [phone]           Time coordinating discharge: 35 minutes  Signed:  Geradine Girt DO  [facility] 03/29/2019, 2:24 PM

## 2019-03-29 NOTE — Evaluation (Signed)
Occupational Therapy Evaluation Patient Details Name: Gary [name] MRN: [id number] DOB: 21-May-1932 Today's Date: 03/29/2019    History of Present Illness Patient is a 86 y/o male who presents with chest pain. PMH includes arthritis, anxiety, CAD s/p stents, back surgery x3, colectomy 07/2013, HTN, HLD, NSTEMI, pacer, CKD stage III.   Clinical Impression   Pt PTA: Pt living with family and reports independence, but difficulty with garden tub transfers. Pt currently at baseline for ADL with increased time and mobility in hallway and room with supervisionA overall with RW. Pt with good safety awareness and mo LOB episodes. Pt has R hip pain from previous R THA. Pt does not require continued OT skilled services. OT signing off.   SOB - noted, but O2 >96% on RA; HR 88 BPM.   Follow Up Recommendations  No OT follow up;Supervision - Intermittent    Equipment Recommendations  None recommended by OT    Recommendations for Other Services       Precautions / Restrictions Precautions Precautions: Fall Restrictions Weight Bearing Restrictions: No      Mobility Bed Mobility Overal bed mobility: Needs Assistance Bed Mobility: Supine to Sit     Supine to sit: Supervision     General bed mobility comments: Minimal use of rail  Transfers Overall transfer level: Needs assistance Equipment used: Rolling walker (2 wheeled) Transfers: Sit to/from [facility] Sit to Stand: Min guard Stand pivot transfers: Min guard       General transfer comment: Pt requires cues for hand placement safety.     Balance Overall balance assessment: Needs assistance Sitting-balance support: No upper extremity supported;Feet supported Sitting balance-Leahy Scale: Good Sitting balance - Comments: supervision   Standing balance support: During functional activity Standing balance-Leahy Scale: Fair Standing balance comment: standing at sink for light ADL <1 min after ambulation                            ADL either performed or assessed with clinical judgement   ADL Overall ADL's : At baseline                                       General ADL Comments: Pt requiring increased time for tasks; donning pants in sitting and fixing socks by lifting feet onto bed.     Vision Baseline Vision/History: Wears glasses Wears Glasses: At all times Patient Visual Report: No change from baseline Vision Assessment?: No apparent visual deficits     Perception     Praxis      Pertinent Vitals/Pain Pain Assessment: Faces Faces Pain Scale: Hurts a little bit Pain Location: right hip with movement Pain Descriptors / Indicators: Sore;Aching Pain Intervention(s): Monitored during session     Hand Dominance Right   Extremity/Trunk Assessment Upper Extremity Assessment Upper Extremity Assessment: Overall WFL for tasks assessed   Lower Extremity Assessment Lower Extremity Assessment: Overall WFL for tasks assessed   Cervical / Trunk Assessment Cervical / Trunk Assessment: Kyphotic   Communication Communication Communication: HOH   Cognition Arousal/Alertness: Awake/alert Behavior During Therapy: WFL for tasks assessed/performed Overall Cognitive Status: No family/caregiver present to determine baseline cognitive functioning                                 General Comments: Increased time with  answers and tangential at times.   General Comments  Pt concerned about not being able to financially upgrade his garden tub to a walk in shower, asking for resources.    Exercises     Shoulder Instructions      Home Living Family/patient expects to be discharged to:: Private residence Living Arrangements: Spouse/significant other;Children Available Help at Discharge: Family;Available 24 hours/day Type of Home: House Home Access: Stairs to enter [employer] of Steps: 1 Entrance Stairs-Rails: None Home Layout: One  level     Bathroom Shower/Tub: Teacher, early years/pre: Standard     Home Equipment: Environmental consultant - 2 wheels;Bedside commode;Cane - single point;Adaptive equipment;Wheelchair - manual;Tub bench   Additional Comments: Wife and daughter are disabled.      Prior Functioning/Environment Level of Independence: Independent with assistive device(s)        Comments: Uses RW vs SPC for ambulation. Has difficulty getting in/out of garden tub. Reports 1 fall.  Daughter does the driving. Does not cook/clean.        OT Problem List: Decreased activity tolerance      OT Treatment/Interventions:      OT Goals(Current goals can be found in the care plan section) Acute Rehab OT Goals Patient Stated Goal: to get stronger and work on my balance OT Goal Formulation: With patient Time For Goal Achievement: 04/12/19 Potential to Achieve Goals: Good  OT Frequency:     Barriers to D/C:            Co-evaluation              AM-PAC OT "6 Clicks" Daily Activity     Outcome Measure Help from another person eating meals?: None Help from another person taking care of personal grooming?: None Help from another person toileting, which includes using toliet, bedpan, or urinal?: None Help from another person bathing (including washing, rinsing, drying)?: A Little Help from another person to put on and taking off regular upper body clothing?: None Help from another person to put on and taking off regular lower body clothing?: None 6 Click Score: 23   End of Session Equipment Utilized During Treatment: Rolling walker Nurse Communication: Mobility status  Activity Tolerance: Patient tolerated treatment well Patient left: in bed;with call bell/phone within reach  OT Visit Diagnosis: Muscle weakness (generalized) (M62.81);Pain Pain - Right/Left: Right Pain - part of body: Hip                Time: 8841-6606 OT Time Calculation (min): 16 min Charges:  OT General Charges $OT Visit: 1  Visit OT Evaluation $OT Eval Moderate Complexity: 1 Mod  Jefferey Pica, OTR/L Acute Rehabilitation Services Pager: [phone] Office: [place] 03/29/2019, 3:54 PM

## 2019-03-29 NOTE — Progress Notes (Addendum)
Progress Note  Patient Name: Gary [name] Date of Encounter: 03/29/2019  Primary Cardiologist: Pixie Casino, MD   Subjective   Pt feels well, no further chest pain  Inpatient Medications    Scheduled Meds: . amLODipine  5 mg Oral Daily  . aspirin EC  81 mg Oral Daily  . citalopram  40 mg Oral Daily  . doxazosin  4 mg Oral Daily  . fluticasone  2 spray Each Nare Daily  . furosemide  20 mg Oral QODAY  . gabapentin  200 mg Oral QHS  . heparin  5,000 Units Subcutaneous Q8H  . influenza vaccine adjuvanted  0.5 mL Intramuscular Tomorrow-1000  . insulin aspart  0-5 Units Subcutaneous QHS  . insulin aspart  0-9 Units Subcutaneous TID WC  . pneumococcal 23 valent vaccine  0.5 mL Intramuscular Tomorrow-1000  . simvastatin  40 mg Oral QHS  . tamsulosin  0.4 mg Oral QPC supper  . traMADol-acetaminophen  2 tablet Oral Q8H   Continuous Infusions:  PRN Meds: acetaminophen, nitroGLYCERIN, ondansetron (ZOFRAN) IV   Vital Signs    Vitals:   03/29/19 0404 03/29/19 0404 03/29/19 0405 03/29/19 0755  BP:  136/69  120/65  Pulse:  74  73  Resp:  20    Temp: (!) 96.8 F (36 C)   99.1 F (37.3 C)  TempSrc: Oral   Oral  SpO2:  96%  94%  Weight:   92.1 kg   Height:        Intake/Output Summary (Last 24 hours) at 03/29/2019 0923 Last data filed at 03/29/2019 0850 Gross per 24 hour  Intake 660 ml  Output 576 ml  Net 84 ml   Last 3 Weights 03/29/2019 03/28/2019 03/04/2019  Weight (lbs) 203 lb 204 lb 5.9 oz 205 lb  Weight (kg) 92.08 kg 92.7 kg 92.987 kg      Telemetry    Paced rhythm - Personally Reviewed  ECG    No new tracings - Personally Reviewed  Physical Exam   GEN: No acute distress.   Neck: No JVD Cardiac: RRR, no murmurs, rubs, or gallops.  Respiratory: Clear to auscultation bilaterally. GI: Soft, nontender, non-distended  MS: No edema; No deformity. Neuro:  Nonfocal  Psych: Normal affect   Labs    High Sensitivity Troponin:   Recent Labs  Lab  03/18/19 0906 03/18/19 1220 03/27/19 1847 03/27/19 2109  TROPONINIHS 35* 31* 24* 30*      Chemistry Recent Labs  Lab 03/27/19 1847 03/29/19 0444  NA 139 139  K 4.4 4.0  CL 102 98  CO2 23 27  GLUCOSE 237* 134*  BUN 21 27*  CREATININE 1.72* 2.07*  CALCIUM 9.4 9.0  PROT 6.7  --   ALBUMIN 4.2  --   AST 20  --   ALT 14  --   ALKPHOS 34*  --   BILITOT 0.6  --   GFRNONAA 36* 29*  GFRAA 41* 33*  ANIONGAP 14 14     Hematology Recent Labs  Lab 03/27/19 1847 03/29/19 0444  WBC 7.5 7.2  RBC 4.69 4.60  HGB 14.0 13.7  HCT 43.6 42.5  MCV 93.0 92.4  MCH 29.9 29.8  MCHC 32.1 32.2  RDW 12.4 12.6  PLT 161 175    BNPNo results for input(s): BNP, PROBNP in the last 168 hours.   DDimer No results for input(s): DDIMER in the last 168 hours.   Radiology    CT Head Wo Contrast  Result Date: 03/27/2019  CLINICAL DATA:  Subacute left arm tingling EXAM: CT HEAD WITHOUT CONTRAST TECHNIQUE: Contiguous axial images were obtained from the base of the skull through the vertex without intravenous contrast. COMPARISON:  04/16/2018 FINDINGS: Brain: No evidence of acute infarction, hemorrhage, hydrocephalus, extra-axial collection or mass lesion/mass effect. Subcortical white matter and periventricular small vessel ischemic changes. Vascular: Intracranial atherosclerosis. Skull: Normal. Negative for fracture or focal lesion. Sinuses/Orbits: The visualized paranasal sinuses are essentially clear. The mastoid air cells are unopacified. Other: None. IMPRESSION: No evidence of acute intracranial abnormality. Small vessel ischemic changes. Electronically Signed   By: Julian Hy M.D.   On: 03/27/2019 21:07   NM Myocar Multi W/Spect W/Wall Motion / EF  Result Date: 03/28/2019  There was no ST segment deviation noted during stress.  No T wave inversion was noted during stress.  This is an intermediate risk study.  The left ventricular ejection fraction is moderately decreased (30-44%).   Nuclear stress EF: 44%.  Findings consistent with prior myocardial infarction.  1.  There are 2 fixed perfusion defects. A severe, medium size (10% of LV), fixed perfusion defect is present in the mid anteroseptum, extending into a apical septum/apex consistent with prior infarction.  This defect could also be artifact due to ventricular pacing.  A severe, medium size (12% of LV), fixed perfusion defect is present in the lateral wall, basal to mid segments consistent with prior infarction.  No reversible ischemia observed.  The lateral wall defect was reported on the prior study from 01/13/2015. 2.  Moderately reduced left ventricular function, EF 44%. 3.  Global hypokinesis. 4.  This is an intermediate risk study. [place] T. Audie Box, [place] [address] [place], [place] [address] [phone] 12:57 PM  DG Chest Portable 1 View  Result Date: 03/27/2019 CLINICAL DATA:  Pt from home for left sided chest pain that started about 1.5 hrs ago while watching tv, radiates down his left arm with left arm numbness EXAM: PORTABLE CHEST 1 VIEW COMPARISON:  Chest radiograph 03/18/2019 FINDINGS: Stable cardiomediastinal contours with enlarged heart size. Left chest pacer remains in place. Low lung volumes with bronchovascular crowding. No new focal pulmonary opacity. No pneumothorax or large pleural effusion. No acute finding in the visualized skeleton. IMPRESSION: Low lung volumes volumes with bronchovascular crowding. No definite new focal opacity. Electronically Signed   By: Audie Pinto M.D.   On: 03/27/2019 19:13    Cardiac Studies   Nuclear stress test 03/28/19:  There was no ST segment deviation noted during stress.  No T wave inversion was noted during stress.  This is an intermediate risk study.  The left ventricular ejection fraction is moderately decreased (30-44%).  Nuclear stress EF: 44%.  Findings consistent with prior myocardial infarction.   1.  There  are 2 fixed perfusion defects. A severe, medium size (10% of LV), fixed perfusion defect is present in the mid anteroseptum, extending into a apical septum/apex consistent with prior infarction.  This defect could also be artifact due to ventricular pacing.  A severe, medium size (12% of LV), fixed perfusion defect is present in the lateral wall, basal to mid segments consistent with prior infarction.  No reversible ischemia observed.  The lateral wall defect was reported on the prior study from 01/13/2015.  2.  Moderately reduced left ventricular function, EF 44%. 3.  Global hypokinesis. 4.  This is an intermediate risk study.  Patient Profile     86 y.o. male w/ hx NSTEMI  in 2011 with occluded LCx with collaterals (medically managed), CHB status post [employer] PPM,hypertension, hyperlipidemia, OSA, CKD stage III, was admitted 02/13 with chest pain.  Assessment & Plan    1. Chest pain 2. Hx of CAD with occluded LCx - nuclear stress test yesterday with evidence of prior MI but no reversible ischemia - no further cardiac workup - cardiology follow up has been arranged - continue ASA, norvasc 5mg  - no plavix in the setting of hx of rectal bleeding - no BB with CHB   2. Hypertension - continue present medications   3. CHB with PPM in place - no change   4. CKD stage III - sCr 2.07 (1.72) - baseline appears to be 1.5-1.8 - per primary    [facility] will sign off.   Medication Recommendations:  As in [facility] Other recommendations (labs, testing, etc):  BMP in 1 week Follow up as an outpatient:  OP appt has been arranged  For questions or updates, please contact [place] Please consult [email or web address] for contact info under        Signed, Ledora Bottcher, PA  03/29/2019, 9:23 AM

## 2019-03-29 NOTE — Care Management Obs Status (Signed)
[place] NOTIFICATION   Patient Details  Name: Gary [name] MRN: [id number] Date of Birth: Jun 21, 1932   Medicare Observation Status Notification Given:  Yes    Bethena Roys, RN 03/29/2019, 11:12 AM

## 2019-03-29 NOTE — Evaluation (Signed)
Physical Therapy Evaluation Patient Details Name: Gary [name] MRN: [id number] DOB: [birth year] Today's Date: 03/29/2019   History of Present Illness  Patient is a 86 y/o male who presents with chest pain. PMH includes arthritis, anxiety, CAD s/p stents, back surgery x3, colectomy 07/2013, HTN, HLD, NSTEMI, pacer, CKD stage III.  Clinical Impression  Patient presents with generalized weakness, impaired balance, dyspnea on exertion and impaired mobility s/p above. Pt reports being mod I PTA using RW vs SPC for ambulation. Lives with spouse and daughter. Today, pt tolerated bed mobility, transfers and gait training with min guard assist for balance/safety and use of RW. Sp02 remained >93% on RA with 2/4 DOE with mobility. Reports some right hip pain after gait training. Pt interested in getting stronger and improving balance. Recommend OPPT. Also interested in getting some possible resources to assist financially with getting a walk in shower? Will follow acutely to maximize independence and mobility prior to return home.    Follow Up Recommendations Outpatient PT;Supervision - Intermittent    Equipment Recommendations  None recommended by PT    Recommendations for Other Services       Precautions / Restrictions Precautions Precautions: Fall Restrictions Weight Bearing Restrictions: No      Mobility  Bed Mobility Overal bed mobility: Needs Assistance Bed Mobility: Supine to Sit     Supine to sit: Min guard;HOB elevated     General bed mobility comments: Increased time and effort to get to EOB, use of rail.  Transfers Overall transfer level: Needs assistance Equipment used: Rolling walker (2 wheeled) Transfers: Sit to/from Stand Sit to Stand: Min guard         General transfer comment: Min guard for safety. Stood from [employer], from toilet x1, transferred to chair post ambulation.  Ambulation/Gait Ambulation/Gait assistance: Min guard Gait Distance (Feet): 150  Feet Assistive device: Rolling walker (2 wheeled) Gait Pattern/deviations: Step-through pattern;Decreased stride length;Trunk flexed Gait velocity: decreased Gait velocity interpretation: <1.31 ft/sec, indicative of household ambulator General Gait Details: SLow, mildly unsteady gait with RW for support. 2/4 DOE. Sp02 >93% on RA.  Stairs            Wheelchair Mobility    Modified Rankin (Stroke Patients Only)       Balance Overall balance assessment: Needs assistance Sitting-balance support: No upper extremity supported;Feet supported Sitting balance-Leahy Scale: Good Sitting balance - Comments: supervision   Standing balance support: During functional activity Standing balance-Leahy Scale: Fair Standing balance comment: Able to stand at sink and wash hands without difficulty or lOB.                             Pertinent Vitals/Pain Pain Assessment: Faces Faces Pain Scale: Hurts a little bit Pain Location: right hip with movement Pain Descriptors / Indicators: Sore;Aching Pain Intervention(s): Repositioned;Monitored during session    Home Living Family/patient expects to be discharged to:: Private residence Living Arrangements: Spouse/significant other;Children Available Help at Discharge: Family;Available 24 hours/day Type of Home: House Home Access: Stairs to enter Entrance Stairs-Rails: None Entrance Stairs-Number of Steps: 1 Home Layout: One level Home Equipment: Walker - 2 wheels;Bedside commode;Cane - single point;Adaptive equipment;Wheelchair - manual;Tub bench Additional Comments: Wife and daughter are disabled.    Prior Function Level of Independence: Independent with assistive device(s)         Comments: Uses RW vs SPC for ambulation. Has difficulty getting in/out of garden tub. Reports 1 fall.  Daughter does the  driving. Does not cook/clean.     Hand Dominance   Dominant Hand: Right    Extremity/Trunk Assessment   Upper  Extremity Assessment Upper Extremity Assessment: Defer to OT evaluation    Lower Extremity Assessment Lower Extremity Assessment: Generalized weakness(but functional)    Cervical / Trunk Assessment Cervical / Trunk Assessment: Kyphotic  Communication   Communication: HOH  Cognition Arousal/Alertness: Awake/alert Behavior During Therapy: WFL for tasks assessed/performed Overall Cognitive Status: No family/caregiver present to determine baseline cognitive functioning                                 General Comments: Tangential with history, digresses a lot with questions.      General Comments General comments (skin integrity, edema, etc.): Pt concerned about not being able to financially upgrade his garden tub to a walk in shower, asking for resources.    Exercises     Assessment/Plan    PT Assessment Patient needs continued PT services  PT Problem List Decreased strength;Decreased mobility;Pain;Decreased balance;Decreased activity tolerance;Cardiopulmonary status limiting activity       PT Treatment Interventions Therapeutic activities;Gait training;Therapeutic exercise;Patient/family education;Balance training;Functional mobility training    PT Goals (Current goals can be found in the Care Plan section)  Acute Rehab PT Goals Patient Stated Goal: to get stronger and work on my balance PT Goal Formulation: With patient Time For Goal Achievement: 04/12/19 Potential to Achieve Goals: Good    Frequency Min 3X/week   Barriers to discharge        Co-evaluation               AM-PAC PT "6 Clicks" Mobility  Outcome Measure Help needed turning from your back to your side while in a flat bed without using bedrails?: None Help needed moving from lying on your back to sitting on the side of a flat bed without using bedrails?: A Little Help needed moving to and from a bed to a chair (including a wheelchair)?: A Little Help needed standing up from a chair  using your arms (e.g., wheelchair or bedside chair)?: A Little Help needed to walk in hospital room?: A Little Help needed climbing 3-5 steps with a railing? : A Little 6 Click Score: 19    End of Session Equipment Utilized During Treatment: Gait belt Activity Tolerance: Patient tolerated treatment well Patient left: in chair;with call bell/phone within reach;with chair alarm set Nurse Communication: Mobility status PT Visit Diagnosis: Pain;Unsteadiness on feet (R26.81);Muscle weakness (generalized) (M62.81) Pain - Right/Left: Right Pain - part of body: Hip    Time: 1914-7829 PT Time Calculation (min) (ACUTE ONLY): 31 min   Charges:   PT Evaluation $PT Eval Moderate Complexity: 1 Mod PT Treatments $Gait Training: 8-22 mins        Marisa Severin, PT, DPT Acute Rehabilitation Services Pager [phone] Office [place] 03/29/2019, 12:07 PM

## 2019-03-29 NOTE — Progress Notes (Signed)
Brief Nutrition Education Note   RD working remotely.  RD consulted for nutrition education regarding diabetes.   Lab Results  Component Value Date   HGBA1C 7.4 (H) 03/28/2019  PTA DM medications are none. Per ADA's Standards of Medical Care for Diabetes, glycemic targets for elderly patients who are otherwise healthy (few medical impairments) and cognitively intact should be less stringent (Hgb A1c <7.5).   Labs reviewed: CBGS: 138-144 (inpatient orders for glycemic control are 0-5 units insulin aspart q HS and 0-9 units insulin aspart TID with meals).  Per MD notes, no plans to checks CBGS or take medications at this time. Plan for lifestyle modifications and close PCP folllow-up.   Attempted to speak with pt x 3 via phone, however, unable to reach. Phone line busy on all attempts.    RD provided "Carbohydrate Counting for People with Diabetes" handout from the Academy of Nutrition and Dietetics.   Current diet order is heart healthy/ carb modified, patient is consuming approximately 50% of meals at this time. Labs and medications reviewed. No further nutrition interventions warranted at this time. RD contact information provided. If additional nutrition issues arise, please re-consult RD.  Loistine Chance, RD, LDN, [place] Registered Dietitian II Certified Diabetes Care and Education Specialist Please refer to [facility] for RD and/or RD on-call/weekend/after hours pager

## 2019-03-29 NOTE — Care Management (Signed)
1256 03-29-19 Case Manager spoke with patient regarding outpatient physical therapy evaluations. Patient wanted to return to [facility] for outpatient Physical Therapy. Case manager discussed home health with the patient and he has opted for outpatient services at this time. Case Manager did call the office of [place] and an appointment was scheduled. Patient states he will have transportation to get to appointments. No further needs from Case Manager at this time. Bethena Roys, RN,BSN

## 2019-03-29 NOTE — Progress Notes (Addendum)
Inpatient Diabetes Program Recommendations  AACE/ADA: New Consensus Statement on Inpatient Glycemic Control  Target Ranges:  Prepandial:   less than 140 mg/dL      Peak postprandial:   less than 180 mg/dL (1-2 hours)      Critically ill patients:  140 - 180 mg/dL   Results for Astarita, Binnie R "DON" (MRN [id number]) as of 03/29/2019 15:12  Ref. Range 03/28/2019 06:13 03/28/2019 11:51 03/28/2019 16:32 03/28/2019 21:43 03/29/2019 06:50 03/29/2019 11:31  Glucose-Capillary Latest Ref Range: 70 - 99 mg/dL 132 (H) 161 (H) 153 (H) 144 (H) 138 (H) 141 (H)  Results for Maultsby, Peter R "DON" (MRN [id number]) as of 03/29/2019 15:12  Ref. Range 03/28/2019 01:33  Hemoglobin A1C Latest Ref Range: 4.8 - 5.6 % 7.4 (H)   Review of Glycemic Control  Diabetes history: No Outpatient Diabetes medications: NA Current orders for Inpatient glycemic control: Novolog 0-9 units TID with meals, Novolog 0-5 units QHS  NOTE: Spoke with patient about new diabetes diagnosis. Patient reports that he has never been told he had DM in the past.  Discussed A1C results (7.4% on 03/28/19) and explained what an A1C is and informed patient that his current A1C indicates an average glucose of 166  mg/dl over the past 2-3 months. Discussed basic pathophysiology of DM Type 2, basic home care, importance of checking CBGs and maintaining good CBG control to prevent long-term and short-term complications. Reviewed glucose and A1C goals.  Discussed impact of nutrition, exercise, stress, sickness, and medications on diabetes control. Informed patient that a Living Well with diabetes booklet was ordered and he should receive prior to being discharged.  Encouraged patient to read through entire book and have his wife read the book as well. Informed patient that MD was not going to prescribe any DM medications at this time but has asked that he check glucose and make dietary changes to follow Carb Modified diet. Discussed Carb Modified diet and provided  handout on Planning Healthy Meals.  Discussed glucose monitoring and asked patient to check as MD directs on discharge paperwork and asked that he keep a written record of glucose values so he can take with him to follow up visit with PCP. Patient reports that his prior PCP retired and he is in the process of getting established with his wife's PCP group.  Informed patient that outpatient DM education referral was made so they should be contacting him to set up outpatient education in a few days.  Patient verbalized understanding of information discussed and he states that he has no further questions at this time related to diabetes.   RNs to provide ongoing basic DM education at bedside with this patient and engage patient to actively check blood glucose.  Thanks, Barnie Alderman, RN, MSN, CDE Diabetes Coordinator Inpatient Diabetes Program [phone] (Team Pager from 8am to 5pm)

## 2019-03-30 NOTE — Progress Notes (Deleted)
Cardiology Office Note   Date:  03/30/2019   ID:  Gary [name], DOB [birth year], MRN [id number]  PCP:  Minette Brine, FNP  Cardiologist:  Pixie Casino, MD EP: Virl Axe, MD  No chief complaint on file.     History of Present Illness: Gary [name] is a 86 y.o. male with PMH of CAD s/p NSTEMI in 2011 with occluded LCx with collaterals (medically managed), CHB s/p medtronic PPM, HTN, HLD, ?PAF in the setting of sepsis, OSA, and CKD stage 3, who presents for post-hospital follow-up.  He presented to the ED 03/18/19 where I saw him with Dr. Acie Fredrickson for the evaluation of vague chest pain complaints. He had low flat trend troponin levels and a negative EKG, therefore decision made to discharge home with outpatient follow-up. He returned to the ED 03/27/19 with similar complaints and was admitted. He underwent a NST which showed evidence of prior MI but no reversible ischemia. EF was noted to be 44% at that time. He has no known history of CHF. Last echo 04/2018 showed EF 60-65%, G1DD, no RWMA, moderate MAC/aortic calcifications, and mild ascending aortic aneurysm of 74m.  1. Recent chest pain in patient with CAD s/p NSTEMI in 2011 with occluded LCx with collaterals (medically managed): recent NST with fixed defects c/w prior MI. EF noted to be 44% though he has no history of LV systolic dysfunction. He was noted to not be on a BBlocker due to CHB history, though he now has a PPM in place.  - Will update an echocardiogram to reevaluate his LV function - Continue aspirin and statin - Start metoprolol *** - Continue amlodipine for antianginal effect  2. HTN: BP *** today - Continue amlodipine and doxazosin   3. HLD: no recent lipids - Continue simvastatin  4. CHB s/p PPM: device functioning normally on last check 12/2018 - Continue routine monitoring - next device check is 04/02/19  5. AoCKD: Cr 2.07 on the day of discharge, up from baseline 1.5-1.8 - Recommend repeat labs with PCP  in 1 week for close monitoring ***    Past Medical History:  Diagnosis Date  . Adenomatous colon polyp    2010   . Anemia   . Anxiety   . Arthritis    "hips and lower back"  . Arthritis pain   . Blood transfusion   . Chest pain 03/29/11   2D Echo without contrast on 03/29/11 - EF= 55-60%.  . Cholecystitis   . Chronic back pain greater than 3 months duration   . Chronic kidney disease   . Colon polyps   . Complication of anesthesia    once slow to wake up  . Coronary angioplasty status 2011  . Coronary artery disease   . Diarrhea   . Diverticulosis   . Dyslipidemia   . GERD (gastroesophageal reflux disease)   . GI bleeding after 07/2009   "triggered by Plavix & ASA; from my diverticulosis"  . H/O Clostridium difficile infection   . H/O myocardial perfusion scan 04/04/11   For Pre-noncardiac surgery - EF = 67%, no ischemia, and is considered low risk.  .[place] attack ([place] 07[place]   nonSTEMI with an occluded circumflex artery and collaterals.  .Marland KitchenHeart murmur   . Hemorrhoids   . History of shingles   . Hyperlipidemia   . Hypertension   . Kidney stones   . Lower extremity edema    Taking furosemide and it seems to be helping.  .Marland Kitchen  Neuromuscular disorder ([place])   . Nonspecific chest pain 08/26/2012   Went to ED 08/26/12  . Stomach problems   . Syncope and collapse 04/16/2018    Past Surgical History:  Procedure Laterality Date  . BACK SURGERY  1997  . CARDIAC CATHETERIZATION  08/09/2009   Circumflex 100% occluded with right-to-left collaterals, mild irregularities in the proximal and distal LAD and diagonal system, normal LV systolic function, and minor infrarenal irregularities, but no abdominal aortic aneurysm.  . CHOLECYSTECTOMY  04/12/2011   Procedure: CHOLECYSTECTOMY;  Surgeon: Adin Hector, MD;  Location: [facility];  Service: General;  Laterality: N/A;  . CHOLECYSTECTOMY  04/12/2011   Procedure: LAPAROSCOPIC CHOLECYSTECTOMY;  Surgeon: Adin Hector, MD;  Location: [place] [facility];  Service: General;  Laterality: N/A;  converted to open  . COLON SURGERY     COLONOSCOPY  . ESOPHAGOGASTRODUODENOSCOPY N/A 08/05/2013   Procedure: ESOPHAGOGASTRODUODENOSCOPY (EGD);  Surgeon: Jerene Bears, MD;  Location: [facility];  Service: Gastroenterology;  Laterality: N/A;  . EYE SURGERY  1942   "right eye was crossed; they  corrected it"  . LUMBAR FUSION  07/22/2012  . PACEMAKER IMPLANT N/A 04/17/2018   Procedure: PACEMAKER IMPLANT;  Surgeon: Deboraha Sprang, MD;  Location: [facility];  Service: Cardiovascular;  Laterality: N/A;  . POSTERIOR FUSION LUMBAR SPINE  08/1995   "bottom 4 vertebra"  . SUBTOTAL COLECTOMY  01/24/2010  . TOTAL HIP ARTHROPLASTY Right 08/03/2013   Procedure: TOTAL HIP ARTHROPLASTY ANTERIOR APPROACH;  Surgeon: Hessie Dibble, MD;  Location: [facility];  Service: Orthopedics;  Laterality: Right;     Current Outpatient Medications  Medication Sig Dispense Refill  . acetaminophen (TYLENOL) 500 MG tablet Take 1,000 mg by mouth every 8 (eight) hours as needed for fever.    Marland Kitchen amLODipine (NORVASC) 5 MG tablet Take 1 tablet (5 mg total) by mouth daily. 90 tablet 3  . aspirin EC 81 MG tablet Take 81 mg by mouth daily.    . blood glucose meter kit and supplies KIT Dispense based on patient and insurance preference. Use up to four times daily as directed. (FOR ICD-9 250.00, 250.01). 1 each 0  . citalopram (CELEXA) 40 MG tablet Take 40 mg by mouth daily.    Marland Kitchen doxazosin (CARDURA) 4 MG tablet Take 4 mg by mouth daily.     . fluticasone (FLONASE) 50 MCG/ACT nasal spray Place 2 sprays into both nostrils daily.     . furosemide (LASIX) 20 MG tablet Take 1 tablet (20 mg total) by mouth every other day. 45 tablet 1  . gabapentin (NEURONTIN) 100 MG capsule Take 4 capsules (400 mg total) by mouth 3 (three) times daily. 30 capsule 0  . nitroGLYCERIN (NITROSTAT) 0.4 MG SL tablet PLACE 1 TABLET UNDER THE TONGUE EVERY 5 MINUTES AS NEEDED FOR CHEST PAIN FOR 3 DOSES (Patient taking  differently: Place 0.4 mg under the tongue every 5 (five) minutes as needed for chest pain. ) 25 tablet 0  . simvastatin (ZOCOR) 40 MG tablet TAKE 1 TABLET BY MOUTH AT BEDTIME (Patient taking differently: Take 40 mg by mouth at bedtime. ) 90 tablet 2  . Tamsulosin HCl (FLOMAX) 0.4 MG CAPS Take 0.4 mg by mouth daily after supper.    . traMADol-acetaminophen (ULTRACET) 37.5-325 MG per tablet Take 2 tablets by mouth 3 (three) times daily.     . vitamin B-12 1000 MCG tablet Take 1 tablet (1,000 mcg total) by mouth daily. 3 tablet    No  current facility-administered medications for this visit.    Allergies:   Naproxen, Clopidogrel bisulfate, Sulfonamide derivatives, Other, Latex, and Tape    Social History:  The patient  reports that he has never smoked. He has never used smokeless tobacco. He reports that he does not drink alcohol or use drugs.   Family History:  The patient's ***family history includes Diverticulosis in his sister; Heart disease in his father; Pancreatic cancer in his mother; Stroke in his father.    ROS:  Please see the history of present illness.   Otherwise, review of systems are positive for {NONE DEFAULTED:18576::"none"}.   All other systems are reviewed and negative.    PHYSICAL EXAM: VS:  There were no vitals taken for this visit. , BMI There is no height or weight on file to calculate BMI. GEN: Well nourished, well developed, in no acute distress HEENT: normal Neck: no JVD, carotid bruits, or masses Cardiac: ***RRR; no murmurs, rubs, or gallops,no edema  Respiratory:  clear to auscultation bilaterally, normal work of breathing GI: soft, nontender, nondistended, + BS MS: no deformity or atrophy Skin: warm and dry, no rash Neuro:  Strength and sensation are intact Psych: euthymic mood, full affect   EKG:  EKG {ACTION; IS/IS NOT:21021397} ordered today. The ekg ordered today demonstrates ***   Recent Labs: 03/27/2019: ALT 14 03/28/2019: TSH 1.817 03/29/2019:  BUN 27; Creatinine, Ser 2.07; Hemoglobin 13.7; Platelets 175; Potassium 4.0; Sodium 139    Lipid Panel    Component Value Date/Time   CHOL  08/09/2009 0949    164        ATP III CLASSIFICATION:  <200     mg/dL   Desirable  200-239  mg/dL   Borderline High  >=240    mg/dL   High          TRIG 114 08/09/2009 0949   HDL 36 (L) 08/09/2009 0949   CHOLHDL 4.6 08/09/2009 0949   VLDL 23 08/09/2009 0949   LDLCALC (H) 08/09/2009 0949    105        Total Cholesterol/HDL:CHD Risk Coronary Heart Disease Risk Table                     Men   Women  1/2 Average Risk   3.4   3.3  Average Risk       5.0   4.4  2 X Average Risk   9.6   7.1  3 X Average Risk  23.4   11.0        Use the calculated Patient Ratio above and the CHD Risk Table to determine the patient's CHD Risk.        ATP III CLASSIFICATION (LDL):  <100     mg/dL   Optimal  100-129  mg/dL   Near or Above                    Optimal  130-159  mg/dL   Borderline  160-189  mg/dL   High  >190     mg/dL   Very High      Wt Readings from Last 3 Encounters:  03/29/19 203 lb (92.1 kg)  03/04/19 205 lb (93 kg)  10/15/18 201 lb (91.2 kg)      Other studies Reviewed: Additional studies/ records that were reviewed today include:   Nuclear stress test 03/28/19:  There was no ST segment deviation noted during stress.  No T wave inversion was noted during stress.    This is an intermediate risk study.  The left ventricular ejection fraction is moderately decreased (30-44%).  Nuclear stress EF: 44%.  Findings consistent with prior myocardial infarction.  1. There are 2 fixed perfusion defects. A severe, medium size (10% of LV), fixed perfusion defect is present in the mid anteroseptum, extending into a apical septum/apex consistent with prior infarction. This defect could also be artifact due to ventricular pacing. A severe, medium size (12% of LV), fixed perfusion defect is present in the lateral wall, basal to mid  segments consistent with prior infarction. No reversible ischemia observed. The lateral wall defect was reported on the prior study from 01/13/2015.  2. Moderately reduced left ventricular function, EF 44%. 3. Global hypokinesis. 4. This is an intermediate risk study.    ASSESSMENT AND PLAN:  1.  ***   Current medicines are reviewed at length with the patient today.  The patient {ACTIONS; HAS/DOES NOT HAVE:19233} concerns regarding medicines.  The following changes have been made:  {PLAN; NO CHANGE:13088:s}  Labs/ tests ordered today include: *** No orders of the defined types were placed in this encounter.    Disposition:   FU with *** in {gen number 0-10:310397} {Days to years:10300}  Signed,  M. , PA-C  03/30/2019 10:02 PM    

## 2019-03-31 ENCOUNTER — Telehealth: Payer: Self-pay

## 2019-03-31 ENCOUNTER — Telehealth (INDEPENDENT_AMBULATORY_CARE_PROVIDER_SITE_OTHER): Payer: Medicare PPO | Admitting: Medical

## 2019-03-31 ENCOUNTER — Ambulatory Visit: Payer: Medicare PPO | Admitting: Medical

## 2019-03-31 ENCOUNTER — Encounter: Payer: Self-pay | Admitting: Nurse Practitioner

## 2019-03-31 ENCOUNTER — Encounter: Payer: Self-pay | Admitting: Medical

## 2019-03-31 VITALS — BP 167/76 | HR 83 | Ht 66.0 in | Wt 204.0 lb

## 2019-03-31 DIAGNOSIS — E785 Hyperlipidemia, unspecified: Secondary | ICD-10-CM | POA: Diagnosis not present

## 2019-03-31 DIAGNOSIS — I10 Essential (primary) hypertension: Secondary | ICD-10-CM

## 2019-03-31 DIAGNOSIS — I2510 Atherosclerotic heart disease of native coronary artery without angina pectoris: Secondary | ICD-10-CM

## 2019-03-31 DIAGNOSIS — I442 Atrioventricular block, complete: Secondary | ICD-10-CM | POA: Diagnosis not present

## 2019-03-31 DIAGNOSIS — N1832 Chronic kidney disease, stage 3b: Secondary | ICD-10-CM

## 2019-03-31 MED ORDER — CARVEDILOL 3.125 MG PO TABS: 3.1250 mg | ORAL_TABLET | Freq: Two times a day (BID) | ORAL | 3 refills | Status: DC

## 2019-03-31 NOTE — Telephone Encounter (Signed)
error 

## 2019-03-31 NOTE — Patient Instructions (Addendum)
Medication Instructions:  BEGIN TAKING CARVEDILOL 3.125 MG TWICE A DAY (1 TABLET TWICE A DAY) *If you need a refill on your cardiac medications before your next appointment, please call your pharmacy*  Lab Work: HAVE BMET DONE WITH YOUR PCP If you have labs (blood work) drawn today and your tests are completely normal, you will receive your results only by: Marland Kitchen MyChart Message (if you have MyChart) OR . A paper copy in the mail If you have any lab test that is abnormal or we need to change your treatment, we will call you to review the results.  Testing/Procedures: Your physician has requested that you have an echocardiogram. Echocardiography is a painless test that uses sound waves to create images of your heart. It provides your doctor with information about the size and shape of your heart and how well your heart's chambers and valves are working. This procedure takes approximately one hour. There are no restrictions for this procedure.  [place]. HILTY ON 04/20/19  Other Instructions: PLEASE BEGIN A LOW SODIUM DIET, EXAMPLES INCLUDED MONITOR YOUR BLOOD PRESSURE    Low-Sodium Eating Plan Sodium, which is an element that makes up salt, helps you maintain a healthy balance of fluids in your body. Too much sodium can increase your blood pressure and cause fluid and waste to be held in your body. Your health care provider or dietitian may recommend following this plan if you have high blood pressure (hypertension), kidney disease, liver disease, or heart failure. Eating less sodium can help lower your blood pressure, reduce swelling, and protect your heart, liver, and kidneys. What are tips for following this plan? General guidelines  Most people on this plan should limit their sodium intake to 1,500-2,000 mg (milligrams) of sodium each day. Reading food labels   The Nutrition Facts label lists the amount of sodium in one serving of the food. If you eat  more than one serving, you must multiply the listed amount of sodium by the number of servings.  Choose foods with less than 140 mg of sodium per serving.  Avoid foods with 300 mg of sodium or more per serving. Shopping  Look for lower-sodium products, often labeled as "low-sodium" or "no salt added."  Always check the sodium content even if foods are labeled as "unsalted" or "no salt added".  Buy fresh foods. ? Avoid canned foods and premade or frozen meals. ? Avoid canned, cured, or processed meats  Buy breads that have less than 80 mg of sodium per slice. Cooking  Eat more home-cooked food and less restaurant, buffet, and fast food.  Avoid adding salt when cooking. Use salt-free seasonings or herbs instead of table salt or sea salt. Check with your health care provider or pharmacist before using salt substitutes.  Cook with plant-based oils, such as canola, sunflower, or olive oil. Meal planning  When eating at a restaurant, ask that your food be prepared with less salt or no salt, if possible.  Avoid foods that contain MSG (monosodium glutamate). MSG is sometimes added to [place] food, bouillon, and some canned foods. What foods are recommended? The items listed may not be a complete list. Talk with your dietitian about what dietary choices are best for you. Grains Low-sodium cereals, including oats, puffed wheat and rice, and shredded wheat. Low-sodium crackers. Unsalted rice. Unsalted pasta. Low-sodium bread. Whole-grain breads and whole-grain pasta. Vegetables Fresh or frozen vegetables. "No salt added" canned vegetables. "No salt added" tomato sauce and  paste. Low-sodium or reduced-sodium tomato and vegetable juice. Fruits Fresh, frozen, or canned fruit. Fruit juice. Meats and other protein foods Fresh or frozen (no salt added) meat, poultry, seafood, and fish. Low-sodium canned tuna and salmon. Unsalted nuts. Dried peas, beans, and lentils without added salt. Unsalted  canned beans. Eggs. Unsalted nut butters. Dairy Milk. Soy milk. Cheese that is naturally low in sodium, such as ricotta cheese, fresh mozzarella, or Swiss cheese Low-sodium or reduced-sodium cheese. Cream cheese. Yogurt. Fats and oils Unsalted butter. Unsalted margarine with no trans fat. Vegetable oils such as canola or olive oils. Seasonings and other foods Fresh and dried herbs and spices. Salt-free seasonings. Low-sodium mustard and ketchup. Sodium-free salad dressing. Sodium-free light mayonnaise. Fresh or refrigerated horseradish. Lemon juice. Vinegar. Homemade, reduced-sodium, or low-sodium soups. Unsalted popcorn and pretzels. Low-salt or salt-free chips. What foods are not recommended? The items listed may not be a complete list. Talk with your dietitian about what dietary choices are best for you. Grains Instant hot cereals. Bread stuffing, pancake, and biscuit mixes. Croutons. Seasoned rice or pasta mixes. Noodle soup cups. Boxed or frozen macaroni and cheese. Regular salted crackers. Self-rising flour. Vegetables Sauerkraut, pickled vegetables, and relishes. Olives. [place] fries. Onion rings. Regular canned vegetables (not low-sodium or reduced-sodium). Regular canned tomato sauce and paste (not low-sodium or reduced-sodium). Regular tomato and vegetable juice (not low-sodium or reduced-sodium). Frozen vegetables in sauces. Meats and other protein foods Meat or fish that is salted, canned, smoked, spiced, or pickled. Bacon, ham, sausage, hotdogs, corned beef, chipped beef, packaged lunch meats, salt pork, jerky, pickled herring, anchovies, regular canned tuna, sardines, salted nuts. Dairy Processed cheese and cheese spreads. Cheese curds. Blue cheese. Feta cheese. String cheese. Regular cottage cheese. Buttermilk. Canned milk. Fats and oils Salted butter. Regular margarine. Ghee. Bacon fat. Seasonings and other foods Onion salt, garlic salt, seasoned salt, table salt, and sea salt.  Canned and packaged gravies. Worcestershire sauce. Tartar sauce. Barbecue sauce. Teriyaki sauce. Soy sauce, including reduced-sodium. Steak sauce. Fish sauce. Oyster sauce. Cocktail sauce. Horseradish that you find on the shelf. Regular ketchup and mustard. Meat flavorings and tenderizers. Bouillon cubes. Hot sauce and Tabasco sauce. Premade or packaged marinades. Premade or packaged taco seasonings. Relishes. Regular salad dressings. Salsa. Potato and tortilla chips. Corn chips and puffs. Salted popcorn and pretzels. Canned or dried soups. Pizza. Frozen entrees and pot pies. Summary  Eating less sodium can help lower your blood pressure, reduce swelling, and protect your heart, liver, and kidneys.  Most people on this plan should limit their sodium intake to 1,500-2,000 mg (milligrams) of sodium each day.  Canned, boxed, and frozen foods are high in sodium. Restaurant foods, fast foods, and pizza are also very high in sodium. You also get sodium by adding salt to food.  Try to cook at home, eat more fresh fruits and vegetables, and eat less fast food, canned, processed, or prepared foods. This information is not intended to replace advice given to you by your health care provider. Make sure you discuss any questions you have with your health care provider. Document Revised: 01/10/2017 Document Reviewed: 01/22/2016 Elsevier Patient Education  2020 [employer].

## 2019-03-31 NOTE — Progress Notes (Signed)
Virtual Visit via Telephone Note   This visit type was conducted due to national recommendations for restrictions regarding the COVID-19 Pandemic (e.g. social distancing) in an effort to limit this patient's exposure and mitigate transmission in our community.  Due to his co-morbid illnesses, this patient is at least at moderate risk for complications without adequate follow up.  This format is felt to be most appropriate for this patient at this time.  The patient did not have access to video technology/had technical difficulties with video requiring transitioning to audio format only (telephone).  All issues noted in this document were discussed and addressed.  No physical exam could be performed with this format.  Please refer to the patient's chart for his  consent to telehealth for [facility].   Date:  04/01/2019   ID:  Gary [name], DOB February 18, 1932, MRN [id number]  Patient Location: Home Provider Location: Office  PCP:  Minette Brine, FNP  Cardiologist:  Pixie Casino, MD  Electrophysiologist:  Virl Axe, MD   Evaluation Performed:  Follow-Up Visit  Chief Complaint:  Post-hospital follow-up.   History of Present Illness:    Gary [name] is a 86 y.o. male with PMH of CAD s/p NSTEMI in 2011 with occluded LCx with collaterals (medically managed), CHB s/p medtronic PPM, HTN, HLD, ?PAF in the setting of sepsis, OSA, and CKD stage 3, who presents for post-hospital follow-up.   He presented to the ED 03/18/19 where I saw him with Dr. Acie Fredrickson for the evaluation of vague chest pain complaints. He had low flat trend troponin levels and a negative EKG, therefore decision made to discharge home with outpatient follow-up. He returned to the ED 03/27/19 with similar complaints and was admitted. He underwent a NST which showed evidence of prior MI but no reversible ischemia. EF was noted to be 44% at that time. He has no known history of CHF. Last echo 04/2018 showed EF 60-65%, G1DD, no RWMA,  moderate MAC/aortic calcifications, and mild ascending aortic aneurysm of 45m.   He has been doing okay since discharge from the hospital. He has not had any recurrent chest pain. He still feels fatigued. Still having nausea/swetting spells. He is worried about his blood sugars and plans to get established with a new PCP in the coming week. He reports blood pressures are typically in the 140s-160s/70s at home. No complaints of SOB, dizziness, lightheadedness, syncope, palpitations, or LE edema.  The patient does not have symptoms concerning for COVID-19 infection (fever, chills, cough, or new shortness of breath).    Past Medical History:  Diagnosis Date  . Adenomatous colon polyp    2010   . Anemia   . Anxiety   . Arthritis    "hips and lower back"  . Arthritis pain   . Blood transfusion   . Chest pain 03/29/11   2D Echo without contrast on 03/29/11 - EF= 55-60%.  . Cholecystitis   . Chronic back pain greater than 3 months duration   . Chronic kidney disease   . Colon polyps   . Complication of anesthesia    once slow to wake up  . Coronary angioplasty status 2011  . Coronary artery disease   . Diarrhea   . Diverticulosis   . Dyslipidemia   . GERD (gastroesophageal reflux disease)   . GI bleeding after 07/2009   "triggered by Plavix & ASA; from my diverticulosis"  . H/O Clostridium difficile infection   . H/O myocardial perfusion scan 04/04/11  For Pre-noncardiac surgery - EF = 67%, no ischemia, and is considered low risk.  Marland Kitchen Heart attack ([place]) 07/2009   nonSTEMI with an occluded circumflex artery and collaterals.  Marland Kitchen Heart murmur   . Hemorrhoids   . History of shingles   . Hyperlipidemia   . Hypertension   . Kidney stones   . Lower extremity edema    Taking furosemide and it seems to be helping.  . Neuromuscular disorder ([place])   . Nonspecific chest pain 08/26/2012   Went to ED 08/26/12  . Stomach problems   . Syncope and collapse 04/16/2018   Past Surgical History:    Procedure Laterality Date  . BACK SURGERY  1997  . CARDIAC CATHETERIZATION  08/09/2009   Circumflex 100% occluded with right-to-left collaterals, mild irregularities in the proximal and distal LAD and diagonal system, normal LV systolic function, and minor infrarenal irregularities, but no abdominal aortic aneurysm.  . CHOLECYSTECTOMY  04/12/2011   Procedure: CHOLECYSTECTOMY;  Surgeon: Adin Hector, MD;  Location: [place];  Service: General;  Laterality: N/A;  . CHOLECYSTECTOMY  04/12/2011   Procedure: LAPAROSCOPIC CHOLECYSTECTOMY;  Surgeon: Adin Hector, MD;  Location: [place];  Service: General;  Laterality: N/A;  converted to open  . COLON SURGERY     COLONOSCOPY  . ESOPHAGOGASTRODUODENOSCOPY N/A 08/05/2013   Procedure: ESOPHAGOGASTRODUODENOSCOPY (EGD);  Surgeon: Jerene Bears, MD;  Location: [place];  Service: Gastroenterology;  Laterality: N/A;  . EYE SURGERY  1942   "right eye was crossed; they  corrected it"  . LUMBAR FUSION  07/22/2012  . PACEMAKER IMPLANT N/A 04/17/2018   Procedure: PACEMAKER IMPLANT;  Surgeon: Deboraha Sprang, MD;  Location: [place] [facility];  Service: Cardiovascular;  Laterality: N/A;  . POSTERIOR FUSION LUMBAR SPINE  08/1995   "bottom 4 vertebra"  . SUBTOTAL COLECTOMY  01/24/2010  . TOTAL HIP ARTHROPLASTY Right 08/03/2013   Procedure: TOTAL HIP ARTHROPLASTY ANTERIOR APPROACH;  Surgeon: Hessie Dibble, MD;  Location: [place];  Service: Orthopedics;  Laterality: Right;     Current Meds  Medication Sig  . acetaminophen (TYLENOL) 500 MG tablet Take 1,000 mg by mouth every 8 (eight) hours as needed for fever.  Marland Kitchen aspirin EC 81 MG tablet Take 81 mg by mouth daily.  . citalopram (CELEXA) 40 MG tablet Take 40 mg by mouth daily.  Marland Kitchen doxazosin (CARDURA) 4 MG tablet Take 4 mg by mouth daily.   . fluticasone (FLONASE) 50 MCG/ACT nasal spray Place 2 sprays into both nostrils daily.   . furosemide (LASIX) 20 MG tablet Take 1 tablet (20 mg total) by mouth every other day.   . gabapentin (NEURONTIN) 100 MG capsule Take 4 capsules (400 mg total) by mouth 3 (three) times daily.  . nitroGLYCERIN (NITROSTAT) 0.4 MG SL tablet PLACE 1 TABLET UNDER THE TONGUE EVERY 5 MINUTES AS NEEDED FOR CHEST PAIN FOR 3 DOSES (Patient taking differently: Place 0.4 mg under the tongue every 5 (five) minutes as needed for chest pain. )  . simvastatin (ZOCOR) 40 MG tablet TAKE 1 TABLET BY MOUTH AT BEDTIME (Patient taking differently: Take 40 mg by mouth at bedtime. )  . Tamsulosin HCl (FLOMAX) 0.4 MG CAPS Take 0.4 mg by mouth daily after supper.  . traMADol-acetaminophen (ULTRACET) 37.5-325 MG per tablet Take 2 tablets by mouth 3 (three) times daily.   . vitamin B-12 1000 MCG tablet Take 1 tablet (1,000 mcg total) by mouth daily.     Allergies:   Naproxen, Clopidogrel  bisulfate, Sulfonamide derivatives, Other, Latex, and Tape   Social History   Tobacco Use  . Smoking status: Never Smoker  . Smokeless tobacco: Never Used  Substance Use Topics  . Alcohol use: No  . Drug use: No     Family Hx: The patient's family history includes Diverticulosis in his sister; Heart disease in his father; Pancreatic cancer in his mother; Stroke in his father. There is no history of Colon cancer.  ROS:   Please see the history of present illness.     All other systems reviewed and are negative.   Prior CV studies:   The following studies were reviewed today:  Nuclear stress test 03/28/19:  There was no ST segment deviation noted during stress.  No T wave inversion was noted during stress.  This is an intermediate risk study.  The left ventricular ejection fraction is moderately decreased (30-44%).  Nuclear stress EF: 44%.  Findings consistent with prior myocardial infarction.    1. There are 2 fixed perfusion defects. A severe, medium size (10% of LV), fixed perfusion defect is present in the mid anteroseptum, extending into a apical septum/apex consistent with prior infarction. This  defect could also be artifact due to ventricular pacing. A severe, medium size (12% of LV), fixed perfusion defect is present in the lateral wall, basal to mid segments consistent with prior infarction. No reversible ischemia observed. The lateral wall defect was reported on the prior study from 01/13/2015.  2. Moderately reduced left ventricular function, EF 44%.  3. Global hypokinesis.  4. This is an intermediate risk study.    Labs/Other Tests and Data Reviewed:    EKG:  An ECG dated 03/28/19 was personally reviewed today and demonstrated:  A-sensed, V-paced, rate 90 bpm, no significant change from previous.   Recent Labs: 03/27/2019: ALT 14 03/28/2019: TSH 1.817 03/29/2019: BUN 27; Creatinine, Ser 2.07; Hemoglobin 13.7; Platelets 175; Potassium 4.0; Sodium 139   Recent Lipid Panel Lab Results  Component Value Date/Time   CHOL  08/09/2009 09:49 AM    164        ATP III CLASSIFICATION:  <200     mg/dL   Desirable  200-239  mg/dL   Borderline High  >=240    mg/dL   High          TRIG 114 08/09/2009 09:49 AM   HDL 36 (L) 08/09/2009 09:49 AM   CHOLHDL 4.6 08/09/2009 09:49 AM   LDLCALC (H) 08/09/2009 09:49 AM    105        Total Cholesterol/HDL:CHD Risk Coronary Heart Disease Risk Table                     Men   Women  1/2 Average Risk   3.4   3.3  Average Risk       5.0   4.4  2 X Average Risk   9.6   7.1  3 X Average Risk  23.4   11.0        Use the calculated Patient Ratio above and the CHD Risk Table to determine the patient's CHD Risk.        ATP III CLASSIFICATION (LDL):  <100     mg/dL   Optimal  100-129  mg/dL   Near or Above                    Optimal  130-159  mg/dL   Borderline  160-189  mg/dL   High  >  190     mg/dL   Very High    Wt Readings from Last 3 Encounters:  03/31/19 204 lb (92.5 kg)  03/29/19 203 lb (92.1 kg)  03/04/19 205 lb (93 kg)     Objective:    Vital Signs:  BP (!) 167/76   Pulse 83   Ht _0  (1.676 m)   Wt 204 lb (92.5 kg)    BMI 32.93 kg/m    VITAL SIGNS:  reviewed GEN:  no acute distress RESPIRATORY:  speaking in full sentences without SOB CARDIOVASCULAR:  no peripheral edema PSYCH:  tangential  ASSESSMENT & PLAN:    1. Recent chest pain in patient with CAD s/p NSTEMI in 2011 with occluded LCx with collaterals (medically managed): recent NST with fixed defects c/w prior MI. EF noted to be 44% though he has no history of LV systolic dysfunction. He was noted to not be on a BBlocker due to CHB history, though he now has a PPM in place.  - Will update an echocardiogram to reevaluate his LV function  - Will start carvedilol 3.156m BID  - Continue aspirin and statin  - Continue amlodipine for antianginal effect   2. HTN: BP 167/76 today; typically in the 140s/70s  - Will add carvedilol 3.1271mBID - Continue amlodipine, lasix, and doxazosin   3. HLD: no recent lipids  - Continue simvastatin   4. CHB s/p PPM: device functioning normally on last check 12/2018  - Continue routine monitoring - next device check is 04/02/19   5. AoCKD: Cr 2.07 on the day of discharge, up from baseline 1.5-1.8  - Recommend repeat labs with PCP in 1 week for close monitoring      Time:   Today, I have spent 18 minutes with the patient with telehealth technology discussing the above problems.     Medication Adjustments/Labs and Tests Ordered: Current medicines are reviewed at length with the patient today.  Concerns regarding medicines are outlined above.   Tests Ordered: Orders Placed This Encounter  Procedures  . ECHOCARDIOGRAM COMPLETE    Medication Changes: Meds ordered this encounter  Medications  . carvedilol (COREG) 3.125 MG tablet    Sig: Take 1 tablet (3.125 mg total) by mouth 2 (two) times daily with a meal.    Dispense:  30 tablet    Refill:  3    Follow Up:  With Dr. HiDebara Picketts planned 04/20/19    Signed, KrAbigail ButtsPA-C  04/01/2019 8:24 AM    [place]

## 2019-03-31 NOTE — Telephone Encounter (Signed)
Spoke with pt's wife (per DPR) regarding pt's virtual visit today. AVS reviewed with wife. No questions or concerns at this time. Will mail AVS

## 2019-03-31 NOTE — Telephone Encounter (Signed)
Transition Care Management Follow-up Telephone Call  Date of discharge and from where: Monday 03/29/2019 [place]  How have you been since you were released from the hospital? Found out he is Diabetic   Any questions or concerns? yes about diabetic and memory  Items Reviewed:  Did the pt receive and understand the discharge instructions provided? yes and yes  Medications obtained and verified? No medication wife ordered glucose machine  Any new allergies since your discharge?  No  Dietary orders reviewed? Yes  Do you have support at home?  Daughter is helping with errands outside of the home  Other (ie: DME, Home Health, etc) Was told someone will contact them for home health  Functional Questionnaire: (I = Independent and D = Dependent) ADL's D  Bathing/Dressing- D   Meal Prep- D wife will do meals   Eating- I  Maintaining continence- I  Transferring/Ambulation- D walker/cane  Managing Meds- D needs help   Follow up appointments reviewed:    PCP Hospital f/u appt confirmed? Yes  Specialist Hospital f/u appt confirmed? Yes  Are transportation arrangements needed?  No  If their condition worsens, is the pt aware to call  their PCP or go to the ED?  Yes  Was the patient provided with contact information for the PCP's office or ED?  Yes  Was the pt encouraged to call back with questions or concerns? Yes

## 2019-03-31 NOTE — Telephone Encounter (Signed)
toc

## 2019-04-01 ENCOUNTER — Encounter: Payer: Self-pay | Admitting: Medical

## 2019-04-02 ENCOUNTER — Ambulatory Visit (INDEPENDENT_AMBULATORY_CARE_PROVIDER_SITE_OTHER): Payer: Medicare PPO | Admitting: *Deleted

## 2019-04-02 DIAGNOSIS — I442 Atrioventricular block, complete: Secondary | ICD-10-CM

## 2019-04-02 LAB — CUP PACEART REMOTE DEVICE CHECK
Battery Remaining Longevity: 163 mo
Battery Voltage: 3.09 V
Brady Statistic AP VP Percent: 1.02 %
Brady Statistic AP VS Percent: 1.63 %
Brady Statistic AS VP Percent: 37.26 %
Brady Statistic AS VS Percent: 60.08 %
Brady Statistic RA Percent Paced: 2.67 %
Brady Statistic RV Percent Paced: 38.28 %
Date Time Interrogation Session: 20210218191007
Implantable Lead Implant Date: 20200306
Implantable Lead Implant Date: 20200306
Implantable Lead Location: 753859
Implantable Lead Location: 753860
Implantable Lead Model: 5076
Implantable Lead Model: 5076
Implantable Pulse Generator Implant Date: 20200306
Lead Channel Impedance Value: 323 Ohm
Lead Channel Impedance Value: 399 Ohm
Lead Channel Impedance Value: 456 Ohm
Lead Channel Impedance Value: 608 Ohm
Lead Channel Pacing Threshold Amplitude: 0.5 V
Lead Channel Pacing Threshold Amplitude: 0.625 V
Lead Channel Pacing Threshold Pulse Width: 0.4 ms
Lead Channel Pacing Threshold Pulse Width: 0.4 ms
Lead Channel Sensing Intrinsic Amplitude: 0.875 mV
Lead Channel Sensing Intrinsic Amplitude: 0.875 mV
Lead Channel Sensing Intrinsic Amplitude: 5.25 mV
Lead Channel Sensing Intrinsic Amplitude: 5.25 mV
Lead Channel Setting Pacing Amplitude: 1.5 V
Lead Channel Setting Pacing Amplitude: 2.5 V
Lead Channel Setting Pacing Pulse Width: 0.4 ms
Lead Channel Setting Sensing Sensitivity: 0.6 mV

## 2019-04-02 NOTE — Progress Notes (Signed)
PPM Remote  

## 2019-04-08 ENCOUNTER — Encounter: Payer: Self-pay | Admitting: Nurse Practitioner

## 2019-04-08 ENCOUNTER — Other Ambulatory Visit: Payer: Self-pay | Admitting: Medical

## 2019-04-08 ENCOUNTER — Inpatient Hospital Stay: Payer: Medicare PPO | Admitting: Nurse Practitioner

## 2019-04-08 ENCOUNTER — Ambulatory Visit: Payer: Medicare PPO | Admitting: Nurse Practitioner

## 2019-04-08 ENCOUNTER — Other Ambulatory Visit: Payer: Self-pay

## 2019-04-08 VITALS — BP 134/82 | HR 80 | Temp 97.9°F | Ht 63.6 in | Wt 208.0 lb

## 2019-04-08 DIAGNOSIS — I442 Atrioventricular block, complete: Secondary | ICD-10-CM

## 2019-04-08 DIAGNOSIS — R0609 Other forms of dyspnea: Secondary | ICD-10-CM

## 2019-04-08 DIAGNOSIS — E538 Deficiency of other specified B group vitamins: Secondary | ICD-10-CM | POA: Diagnosis not present

## 2019-04-08 DIAGNOSIS — R079 Chest pain, unspecified: Secondary | ICD-10-CM | POA: Diagnosis not present

## 2019-04-08 DIAGNOSIS — Z09 Encounter for follow-up examination after completed treatment for conditions other than malignant neoplasm: Secondary | ICD-10-CM | POA: Diagnosis not present

## 2019-04-08 DIAGNOSIS — N184 Chronic kidney disease, stage 4 (severe): Secondary | ICD-10-CM

## 2019-04-08 DIAGNOSIS — E785 Hyperlipidemia, unspecified: Secondary | ICD-10-CM

## 2019-04-08 DIAGNOSIS — I272 Pulmonary hypertension, unspecified: Secondary | ICD-10-CM | POA: Diagnosis not present

## 2019-04-08 DIAGNOSIS — R7309 Other abnormal glucose: Secondary | ICD-10-CM

## 2019-04-08 DIAGNOSIS — I10 Essential (primary) hypertension: Secondary | ICD-10-CM

## 2019-04-08 DIAGNOSIS — N1832 Chronic kidney disease, stage 3b: Secondary | ICD-10-CM

## 2019-04-08 DIAGNOSIS — R0600 Dyspnea, unspecified: Secondary | ICD-10-CM

## 2019-04-08 DIAGNOSIS — I2510 Atherosclerotic heart disease of native coronary artery without angina pectoris: Secondary | ICD-10-CM

## 2019-04-08 MED ORDER — CYANOCOBALAMIN 1000 MCG/ML IJ SOLN
1000.0000 ug | Freq: Once | INTRAMUSCULAR | Status: AC
  Administered 2019-04-08: 1000 ug via INTRAMUSCULAR

## 2019-04-08 NOTE — Patient Instructions (Signed)
   Check blood sugar once a day and send [place] the readings once a week  Return in 1 week for vitamin B12 injection x 2 weeks  Return in 1 month and will recheck HgbA1c and vitamin b12 and BMP.

## 2019-04-08 NOTE — Progress Notes (Signed)
This visit occurred during the SARS-CoV-2 public health emergency.  Safety protocols were in place, including screening questions prior to the visit, additional usage of staff PPE, and extensive cleaning of exam room while observing appropriate contact time as indicated for disinfecting solutions.  Subjective:     Patient ID: Gary [name] , male    DOB: 12-23-1932 , 86 y.o.   MRN: [id number]   Chief Complaint  Patient presents with  . New Patient (Initial Visit)  . Hospitalization Follow-up    HPI  Here to establish care - he was referred by his wife. He had been seeing Dr. Nyoka Cowden who has since retired.  He seen Dr. Nyoka Cowden in the office in October 2020.  Married to his wife 18 years. 2 children - Merry Proud (27 yr old) and Tamela (84 yr old) (bipolar and able to help with the care) lives with him and his wife.  Unfortunately since her living with them she has caused some problems.  This is affecting his health.  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   [facility] - sister with cancer (groin area).   He sees Dr. Debara Pickett cardiology - his blood pressure has been up and down.  He has an ECHO on 04/19/2019.  He was admitted to the hospital     Past Medical History:  Diagnosis Date  . Adenomatous colon polyp    2010   . Anemia   . Anxiety   . Arthritis    "hips and lower back"  . Arthritis pain   . Blood transfusion   . Chest pain 03/29/11   2D Echo without contrast on 03/29/11 - EF= 55-60%.  . Cholecystitis   . Chronic back pain greater than 3 months duration   . Chronic kidney disease   . Colon polyps   . Complication of anesthesia    once slow to wake up  . Coronary angioplasty status 2011  . Coronary artery disease   . Diarrhea   . Diverticulosis   . Dyslipidemia   . GERD (gastroesophageal reflux disease)   . GI bleeding after 07/2009    "triggered by Plavix & ASA; from my diverticulosis"  . H/O Clostridium difficile infection   . H/O myocardial perfusion scan 04/04/11   For Pre-noncardiac surgery - EF = 67%, no ischemia, and is considered low risk.  Marland Kitchen Heart attack ([place]) 07/2009   nonSTEMI with an occluded circumflex artery and collaterals.  Marland Kitchen Heart murmur   . Hemorrhoids   . History of shingles   . Hyperlipidemia   . Hypertension   . Kidney stones   . Lower extremity edema    Taking furosemide and it seems to be helping.  . Neuromuscular disorder ([place])   . Nonspecific chest pain 08/26/2012   Went to ED 08/26/12  . Stomach problems   . Syncope and collapse 04/16/2018     Family History  Problem Relation Age of Onset  . Heart disease Father   . Stroke Father   . Pancreatic cancer Mother   . Diverticulosis Sister        x4  . Colon cancer Neg Hx      Current Outpatient Medications:  .  acetaminophen (TYLENOL) 500 MG tablet, Take 1,000 mg by mouth every 8 (eight) hours as needed for fever., Disp: , Rfl:  .  aspirin EC 81 MG tablet,  Take 81 mg by mouth daily., Disp: , Rfl:  .  carvedilol (COREG) 3.125 MG tablet, Take 1 tablet (3.125 mg total) by mouth 2 (two) times daily with a meal., Disp: 30 tablet, Rfl: 3 .  citalopram (CELEXA) 40 MG tablet, Take 40 mg by mouth daily., Disp: , Rfl:  .  doxazosin (CARDURA) 4 MG tablet, Take 4 mg by mouth daily. , Disp: , Rfl:  .  fluticasone (FLONASE) 50 MCG/ACT nasal spray, Place 2 sprays into both nostrils daily. , Disp: , Rfl:  .  furosemide (LASIX) 20 MG tablet, Take 1 tablet (20 mg total) by mouth every other day., Disp: 45 tablet, Rfl: 1 .  gabapentin (NEURONTIN) 100 MG capsule, Take 4 capsules (400 mg total) by mouth 3 (three) times daily., Disp: 30 capsule, Rfl: 0 .  nitroGLYCERIN (NITROSTAT) 0.4 MG SL tablet, PLACE 1 TABLET UNDER THE TONGUE EVERY 5 MINUTES AS NEEDED FOR CHEST PAIN FOR 3 DOSES (Patient taking differently: Place 0.4 mg under the tongue every 5 (five) minutes  as needed for chest pain. ), Disp: 25 tablet, Rfl: 0 .  simvastatin (ZOCOR) 40 MG tablet, TAKE 1 TABLET BY MOUTH AT BEDTIME (Patient taking differently: Take 40 mg by mouth at bedtime. ), Disp: 90 tablet, Rfl: 2 .  Tamsulosin HCl (FLOMAX) 0.4 MG CAPS, Take 0.4 mg by mouth daily after supper., Disp: , Rfl:  .  traMADol-acetaminophen (ULTRACET) 37.5-325 MG per tablet, Take 2 tablets by mouth 3 (three) times daily. , Disp: , Rfl:  .  vitamin B-12 1000 MCG tablet, Take 1 tablet (1,000 mcg total) by mouth daily., Disp: 3 tablet, Rfl:  .  amLODipine (NORVASC) 5 MG tablet, Take 1 tablet (5 mg total) by mouth daily., Disp: 90 tablet, Rfl: 3 .  blood glucose meter kit and supplies KIT, Dispense based on patient and insurance preference. Use up to four times daily as directed. (FOR ICD-9 250.00, 250.01)., Disp: 1 each, Rfl: 0   Allergies  Allergen Reactions  . Naproxen Rash  . Clopidogrel Bisulfate Other (See Comments)    H/O diverticulitis; prone to rectal bleeding.  Plavix complicated this.  jkl  . Sulfonamide Derivatives Hives  . Other Other (See Comments)    Narcotics-C. Diff  . Latex Rash  . Tape Itching and Rash    Latex-Adhesive tape     Review of Systems  Constitutional: Positive for fatigue.  Respiratory: Negative.   Cardiovascular: Negative.   Musculoskeletal: Negative.   Neurological: Negative.   Psychiatric/Behavioral: Negative.      Today's Vitals   04/08/19 1642  BP: 134/82  Pulse: 80  Temp: 97.9 F (36.6 C)  Weight: 208 lb (94.3 kg)  Height: 5' 3.6" (1.615 m)   Body mass index is 36.15 kg/m.   Objective:  Physical Exam Constitutional:      General: He is not in acute distress.    Appearance: Normal appearance. He is obese.  Cardiovascular:     Rate and Rhythm: Normal rate and regular rhythm.     Pulses: Normal pulses.     Heart sounds: Normal heart sounds. No murmur.  Pulmonary:     Effort: Pulmonary effort is normal. No respiratory distress.     Breath  sounds: Normal breath sounds.  Skin:    Capillary Refill: Capillary refill takes less than 2 seconds.  Neurological:     General: No focal deficit present.     Mental Status: He is alert and oriented to person, place, and time.  Cranial Nerves: No cranial nerve deficit.  Psychiatric:        Mood and Affect: Mood normal.        Behavior: Behavior normal.        Thought Content: Thought content normal.        Judgment: Judgment normal.         Assessment And Plan:    1. Pulmonary hypertension ([place])  He is a new patient to the office.   Chronic, he is followed by Cardiology  2. Atrioventricular block, complete (HCC)  Chronic, followed by Cardiology  3. Dyslipidemia  Chronic  Continue with current medications  4. Chest pain, unspecified type  Was admitted to the hospital from 2/13-2/15 for chest pain had nuclear stress test which was similar to prior. He is to follow up with cardiology in 2 weeks. TCM Performed. A member of the clinical team spoke with the patient upon dischare. Discharge summary was reviewed in full detail during the visit. Meds reconciled and compared to discharge meds. Medication list is updated and reviewed with the patient.  Greater than 50% face to face time was spent in counseling an coordination of care.  All questions were answered to the satisfaction of the patient.    5. Vitamin B12 deficiency  Had a significantly low vitamin b12, had first dose of vitamin B12 injection while in hospital  Will administer 2nd of 4 weekly injection today  Also discussed with low vitamin B12 can cause dementia.  - Referral to Chronic Care Management Services - cyanocobalamin ((VITAMIN B-12)) injection 1,000 mcg  6. Abnormal glucose  His HgbA1c is 7.4 which he does not recall having an issue previously, I will set him up with CCM to assist with education and consider medications  Will order a glucometer as well  - Referral to Chronic Care Management  Services  7. Stage 4 chronic kidney disease (HCC)  Creatinine is 2.04, will refer to nephrology as he has been having a decline according to the labs in Epic.    8. Dyspnea on exertion  Wife reports shortness of breath when walking short distances.   May need oxygen therapy at some point.      Minette Brine, FNP    THE PATIENT IS ENCOURAGED TO PRACTICE SOCIAL DISTANCING DUE TO THE COVID-19 PANDEMIC.

## 2019-04-09 ENCOUNTER — Encounter: Payer: Self-pay | Admitting: Nurse Practitioner

## 2019-04-09 DIAGNOSIS — I2510 Atherosclerotic heart disease of native coronary artery without angina pectoris: Secondary | ICD-10-CM

## 2019-04-09 DIAGNOSIS — I442 Atrioventricular block, complete: Secondary | ICD-10-CM

## 2019-04-09 DIAGNOSIS — E785 Hyperlipidemia, unspecified: Secondary | ICD-10-CM

## 2019-04-09 DIAGNOSIS — I10 Essential (primary) hypertension: Secondary | ICD-10-CM

## 2019-04-09 DIAGNOSIS — N1832 Chronic kidney disease, stage 3b: Secondary | ICD-10-CM

## 2019-04-12 MED ORDER — CARVEDILOL 3.125 MG PO TABS: 3.1250 mg | ORAL_TABLET | Freq: Two times a day (BID) | ORAL | 3 refills | Status: DC

## 2019-04-13 ENCOUNTER — Telehealth: Payer: Self-pay

## 2019-04-13 ENCOUNTER — Ambulatory Visit: Payer: Medicare PPO

## 2019-04-13 NOTE — Telephone Encounter (Signed)
Called pt to see what wife is talking about with Education officer, museum and home health

## 2019-04-14 ENCOUNTER — Encounter: Payer: Self-pay | Admitting: Nurse Practitioner

## 2019-04-15 ENCOUNTER — Ambulatory Visit (INDEPENDENT_AMBULATORY_CARE_PROVIDER_SITE_OTHER): Payer: Medicare PPO

## 2019-04-15 ENCOUNTER — Telehealth: Payer: Self-pay

## 2019-04-15 ENCOUNTER — Ambulatory Visit: Payer: Medicare PPO | Admitting: Nurse Practitioner

## 2019-04-15 ENCOUNTER — Ambulatory Visit: Payer: Self-pay

## 2019-04-15 ENCOUNTER — Encounter: Payer: Self-pay | Admitting: Nurse Practitioner

## 2019-04-15 ENCOUNTER — Other Ambulatory Visit: Payer: Self-pay

## 2019-04-15 VITALS — BP 128/60 | HR 90 | Temp 97.5°F | Ht 63.6 in | Wt 209.6 lb

## 2019-04-15 DIAGNOSIS — N1832 Chronic kidney disease, stage 3b: Secondary | ICD-10-CM

## 2019-04-15 DIAGNOSIS — N184 Chronic kidney disease, stage 4 (severe): Secondary | ICD-10-CM | POA: Insufficient documentation

## 2019-04-15 DIAGNOSIS — E538 Deficiency of other specified B group vitamins: Secondary | ICD-10-CM | POA: Diagnosis not present

## 2019-04-15 DIAGNOSIS — R140 Abdominal distension (gaseous): Secondary | ICD-10-CM | POA: Diagnosis not present

## 2019-04-15 DIAGNOSIS — Z638 Other specified problems related to primary support group: Secondary | ICD-10-CM | POA: Diagnosis not present

## 2019-04-15 DIAGNOSIS — I2510 Atherosclerotic heart disease of native coronary artery without angina pectoris: Secondary | ICD-10-CM

## 2019-04-15 DIAGNOSIS — I272 Pulmonary hypertension, unspecified: Secondary | ICD-10-CM

## 2019-04-15 DIAGNOSIS — R0609 Other forms of dyspnea: Secondary | ICD-10-CM | POA: Insufficient documentation

## 2019-04-15 DIAGNOSIS — R7309 Other abnormal glucose: Secondary | ICD-10-CM | POA: Insufficient documentation

## 2019-04-15 MED ORDER — CYANOCOBALAMIN 1000 MCG/ML IJ SOLN
1000.0000 ug | Freq: Once | INTRAMUSCULAR | Status: AC
  Administered 2019-04-15: 1000 ug via INTRAMUSCULAR

## 2019-04-15 NOTE — Patient Instructions (Signed)
Social Worker Visit Information  Goals we discussed today:  Goals Addressed            This Visit's Progress   . Collaborate with RN Case Manager to assist with care coordination needs       CARE PLAN ENTRY (see longtitudinal plan of care for additional care plan information)  Current Barriers:  . ADL IADL limitations due to pulmonary hypertension and coronary atherosclerosis . Family and relationship dysfunction  Social Work Clinical Goal(s):  Marland Kitchen Over the next 90 days, patient will work with care management team to address needs related to care coordination  CCM SW Interventions: Completed 04/15/19 with patient and his wife Lelon Frohlich . Outbound call placed to the patient to introduce care management program . Assessed for current care coordination needs o The patient expresses concern over needing assistance in the home with checking BP and CBG three times a week o Determined the patient is interested in home modifications to change tub shower to a walk-in shower o Discussed concern over family relationship dysfunction surrounding the patients daughter living in the home and dealing with a mental health condition . Mrs Guo reports "we are both so depressed and really need to speak with someone" o Advised Mrs. Behlke this Probation officer is not a Hospital doctor but could link the patient and her to a qualified individual to assist with identified needs o Discussed opportunity to have orders for home health RN to assist with disease management as well as home health SW to assess for resource needs as well as provide clinical support o Collaboration with patients primary care provider to request home health orders . Collaboration with RN Case Manager to communicate patient enrollment status and plan  Patient Self Care Activities:  . Patient verbalizes understanding of plan to work with care management team to assist with care coordination . Self administers medications as prescribed . Attends  all scheduled provider appointments . Calls provider office for new concerns or questions  Initial goal documentation         Materials provided: Verbal education about care management program provided by phone  Mr. Scheer was given information about Chronic Care Management services today including:  1. CCM service includes personalized support from designated clinical staff supervised by his physician, including individualized plan of care and coordination with other care providers 2. 24/7 contact phone numbers for assistance for urgent and routine care needs. 3. Service will only be billed when office clinical staff spend 20 minutes or more in a month to coordinate care. 4. Only one practitioner may furnish and bill the service in a calendar month. 5. The patient may stop CCM services at any time (effective at the end of the month) by phone call to the office staff. 6. The patient will be responsible for cost sharing (co-pay) of up to 20% of the service fee (after annual deductible is met).  Patient agreed to services and verbal consent obtained.   The patient verbalized understanding of instructions provided today and declined a print copy of patient instruction materials.   Follow up plan: SW will follow up with patient by phone over the next two weeks. Please contact me sooner as needed.   Daneen Schick, BSW, CDP Social Worker, Certified Dementia Practitioner [place] [facility] [phone]

## 2019-04-15 NOTE — Chronic Care Management (AMB) (Signed)
Chronic Care Management   Initial Visit Note  04/15/2019 Name: KARAS PICKERILL MRN: [id number] DOB: 10-17-1932  Referred by: Minette Brine, FNP Reason for referral : No chief complaint on file.   SOSTENES KAUFFMANN is a 86 y.o. year old male who is a primary care patient of Minette Brine, [place]. The care management team was consulted for assistance with chronic disease management and care coordination needs related to CKD Stage III and Pulmonary Hypertension, Coronary Athersclerosis.  Review of patient status, including review of consultants reports, relevant laboratory and other test results, and collaboration with appropriate care team members and the patient's provider was performed as part of comprehensive patient evaluation and provision of chronic care management services.    I initiated and established the plan of care for [name] during one on one collaboration with my clinical care management colleague Daneen Schick BSW who is also engaged with this patient to address social work needs.   Outpatient Encounter Medications as of 04/15/2019  Medication Sig  . acetaminophen (TYLENOL) 500 MG tablet Take 1,000 mg by mouth every 8 (eight) hours as needed for fever.  Marland Kitchen amLODipine (NORVASC) 5 MG tablet Take 1 tablet (5 mg total) by mouth daily.  Marland Kitchen aspirin EC 81 MG tablet Take 81 mg by mouth daily.  . blood glucose meter kit and supplies KIT Dispense based on patient and insurance preference. Use up to four times daily as directed. (FOR ICD-9 250.00, 250.01).  . carvedilol (COREG) 3.125 MG tablet Take 1 tablet (3.125 mg total) by mouth 2 (two) times daily with a meal.  . citalopram (CELEXA) 40 MG tablet Take 40 mg by mouth daily.  Marland Kitchen doxazosin (CARDURA) 4 MG tablet Take 4 mg by mouth daily.   . fluticasone (FLONASE) 50 MCG/ACT nasal spray Place 2 sprays into both nostrils daily.   . furosemide (LASIX) 20 MG tablet Take 1 tablet (20 mg total) by mouth every other day.  . gabapentin (NEURONTIN)  100 MG capsule Take 4 capsules (400 mg total) by mouth 3 (three) times daily.  . nitroGLYCERIN (NITROSTAT) 0.4 MG SL tablet PLACE 1 TABLET UNDER THE TONGUE EVERY 5 MINUTES AS NEEDED FOR CHEST PAIN FOR 3 DOSES (Patient taking differently: Place 0.4 mg under the tongue every 5 (five) minutes as needed for chest pain. )  . simvastatin (ZOCOR) 40 MG tablet TAKE 1 TABLET BY MOUTH AT BEDTIME (Patient taking differently: Take 40 mg by mouth at bedtime. )  . Tamsulosin HCl (FLOMAX) 0.4 MG CAPS Take 0.4 mg by mouth daily after supper.  . traMADol-acetaminophen (ULTRACET) 37.5-325 MG per tablet Take 2 tablets by mouth 3 (three) times daily.   . vitamin B-12 1000 MCG tablet Take 1 tablet (1,000 mcg total) by mouth daily.   No facility-administered encounter medications on file as of 04/15/2019.     Goals Addressed    . Assist with Chronic Care Management and Care Coordination needs       CARE PLAN ENTRY (see longtitudinal plan of care for additional care plan information)   Current Barriers:  . Chronic Disease Management support, education, and care coordination needs related to CKD Stage III and Pulmonary Hypertension, Coronary Atherosclerosis  Case Manager Clinical Goal(s):  Marland Kitchen Over the next 90 days, patient will work with the CCM team to address needs related to chronic care management and care coordination needs in patient with CKD Stage III and Pulmonary Hypertension, Coronary Atherosclerosis  Interventions:  . Collaborated with BSW to initiate plan  of care to address needs related to Lacks knowledge of community resource:    in patient with CKD Stage III and Pulmonary Hypertension and Coronary Athersclerosis  Patient Self Care Activities:  . Patient verbalizes understanding of plan to work with care management team to assist with care coordination . Self administers medications as prescribed . Attends all scheduled provider appointments . Calls provider office for new concerns or  questions  Initial goal documentation         Telephone follow up appointment with care management team member scheduled for: 05/12/19  Barb Merino, RN, BSN, CCM Care Management Coordinator [place]  Direct Phone: [phone]

## 2019-04-15 NOTE — Chronic Care Management (AMB) (Signed)
Chronic Care Management    Social Work General Note  04/15/2019 Name: Gary [name] MRN: [id number] DOB: 09-04-1932  Gary [name] is a 86 y.o. year old male who is a primary care patient of Minette Brine, [place]. The CCM was consulted to assist the patient with care coordination.   Gary [name] was given information about Chronic Care Management services today including:  1. CCM service includes personalized support from designated clinical staff supervised by his physician, including individualized plan of care and coordination with other care providers 2. 24/7 contact phone numbers for assistance for urgent and routine care needs. 3. Service will only be billed when office clinical staff spend 20 minutes or more in a month to coordinate care. 4. Only one practitioner may furnish and bill the service in a calendar month. 5. The patient may stop CCM services at any time (effective at the end of the month) by phone call to the office staff. 6. The patient will be responsible for cost sharing (co-pay) of up to 20% of the service fee (after annual deductible is met).  Patient agreed to services and verbal consent obtained.   Review of patient status, including review of consultants reports, relevant laboratory and other test results, and collaboration with appropriate care team members and the patient's provider was performed as part of comprehensive patient evaluation and provision of chronic care management services.    SDOH (Social Determinants of Health) assessments and interventions performed:  No    Outpatient Encounter Medications as of 04/15/2019  Medication Sig   acetaminophen (TYLENOL) 500 MG tablet Take 1,000 mg by mouth every 8 (eight) hours as needed for fever.   amLODipine (NORVASC) 5 MG tablet Take 1 tablet (5 mg total) by mouth daily.   aspirin EC 81 MG tablet Take 81 mg by mouth daily.   blood glucose meter kit and supplies KIT Dispense based on patient and insurance  preference. Use up to four times daily as directed. (FOR ICD-9 250.00, 250.01).   carvedilol (COREG) 3.125 MG tablet Take 1 tablet (3.125 mg total) by mouth 2 (two) times daily with a meal.   citalopram (CELEXA) 40 MG tablet Take 40 mg by mouth daily.   doxazosin (CARDURA) 4 MG tablet Take 4 mg by mouth daily.    fluticasone (FLONASE) 50 MCG/ACT nasal spray Place 2 sprays into both nostrils daily.    furosemide (LASIX) 20 MG tablet Take 1 tablet (20 mg total) by mouth every other day.   gabapentin (NEURONTIN) 100 MG capsule Take 4 capsules (400 mg total) by mouth 3 (three) times daily.   nitroGLYCERIN (NITROSTAT) 0.4 MG SL tablet PLACE 1 TABLET UNDER THE TONGUE EVERY 5 MINUTES AS NEEDED FOR CHEST PAIN FOR 3 DOSES (Patient taking differently: Place 0.4 mg under the tongue every 5 (five) minutes as needed for chest pain. )   simvastatin (ZOCOR) 40 MG tablet TAKE 1 TABLET BY MOUTH AT BEDTIME (Patient taking differently: Take 40 mg by mouth at bedtime. )   Tamsulosin HCl (FLOMAX) 0.4 MG CAPS Take 0.4 mg by mouth daily after supper.   traMADol-acetaminophen (ULTRACET) 37.5-325 MG per tablet Take 2 tablets by mouth 3 (three) times daily.    vitamin B-12 1000 MCG tablet Take 1 tablet (1,000 mcg total) by mouth daily.   No facility-administered encounter medications on file as of 04/15/2019.    Goals Addressed            This Visit's Progress    Collaborate with RN  Case Manager to assist with care coordination needs       CARE PLAN ENTRY (see longtitudinal plan of care for additional care plan information)  Current Barriers:   ADL IADL limitations due to pulmonary HTN and coronary artherosclerosis  Family and relationship dysfunction   Social Work Clinical Goal(s):   Over the next 90 days, patient will work with care management team to address needs related to care coordination  CCM SW Interventions: Completed 04/15/19 with patient and his wife Gary [name] call placed to the  patient to introduce care management program  Assessed for current care coordination needs o The patient expresses concern over needing assistance in the home with checking BP and CBG three times a week o Determined the patient is interested in home modifications to change tub shower to a walk-in shower o Discussed concern over family relationship dysfunction surrounding the patients daughter living in the home and dealing with a mental health condition  Gary [name] reports "we are both so depressed and really need to speak with someone" o Advised Gary [name] this Probation officer is not a Hospital doctor but could link the patient and her to a qualified individual to assist with identified needs o Discussed opportunity to have orders for home health RN to assist with disease management as well as home health SW to assess for resource needs as well as provide clinical support o Collaboration with patients primary care provider to request home health orders  Collaboration with RN Case Manager to communicate patient enrollment status and plan  Patient Self Care Activities:   Patient verbalizes understanding of plan to work with care management team to assist with care coordination  Self administers medications as prescribed  Attends all scheduled provider appointments  Calls provider office for new concerns or questions  Initial goal documentation         Follow Up Plan: SW will follow up with patient by phone over the next two weeks. SW provided direct contact number to the patient and his spouse in the event assistance is desired prior to the next scheduled call.       Gary [name], BSW, CDP Social Worker, Certified Dementia Practitioner [place] [facility] [place] [facility] [phone]  Total time spent performing care coordination and/or care management activities with the patient by phone or face to face = 19 minutes.

## 2019-04-15 NOTE — Progress Notes (Addendum)
This visit occurred during the SARS-CoV-2 public health emergency.  Safety protocols were in place, including screening questions prior to the visit, additional usage of staff PPE, and extensive cleaning of exam room while observing appropriate contact time as indicated for disinfecting solutions.  Subjective:     Patient ID: Gary [name] , male    DOB: 12-05-1932 , 86 y.o.   MRN: [id number]   Chief Complaint  Patient presents with  . B12 Injection    HPI  He is here for 3 of 4 weekly vitamin B12. He does feel a boost of energy right after.  He is using a cpap so he is trying to get used to that and wants to be able to sleep all the way through the night.  He is using the one with the ear prongs.  Has not   He is going tomorrow for a covid vaccine tomorrow morning.   He is going to see Dr. Debara Pickett next week. He had been going to [employer].     Abdominal Pain This is a recurrent problem. The current episode started more than 1 month ago. The pain is located in the LUQ. Pertinent negatives include no headaches.     Past Medical History:  Diagnosis Date  . Adenomatous colon polyp    2010   . Anemia   . Anxiety   . Arthritis    "hips and lower back"  . Arthritis pain   . Blood transfusion   . Chest pain 03/29/11   2D Echo without contrast on 03/29/11 - EF= 55-60%.  . Cholecystitis   . Chronic back pain greater than 3 months duration   . Chronic kidney disease   . Colon polyps   . Complication of anesthesia    once slow to wake up  . Coronary angioplasty status 2011  . Coronary artery disease   . Diarrhea   . Diverticulosis   . Dyslipidemia   . GERD (gastroesophageal reflux disease)   . GI bleeding after 07/2009   "triggered by Plavix & ASA; from my diverticulosis"  . H/O Clostridium difficile infection   . H/O myocardial perfusion scan 04/04/11   For Pre-noncardiac surgery - EF = 67%, no ischemia, and is considered low risk.  Marland Kitchen Heart attack ([place]) 07/2009   nonSTEMI with an occluded circumflex artery and collaterals.  Marland Kitchen Heart murmur   . Hemorrhoids   . History of shingles   . Hyperlipidemia   . Hypertension   . Kidney stones   . Lower extremity edema    Taking furosemide and it seems to be helping.  . Neuromuscular disorder ([place])   . Nonspecific chest pain 08/26/2012   Went to ED 08/26/12  . Stomach problems   . Syncope and collapse 04/16/2018     Family History  Problem Relation Age of Onset  . Heart disease Father   . Stroke Father   . Pancreatic cancer Mother   . Diverticulosis Sister        x4  . Colon cancer Neg Hx      Current Outpatient Medications:  .  acetaminophen (TYLENOL) 500 MG tablet, Take 1,000 mg by mouth every 8 (eight) hours as needed for fever., Disp: , Rfl:  .  amLODipine (NORVASC) 5 MG tablet, Take 1 tablet (5 mg total) by mouth daily., Disp: 90 tablet, Rfl: 3 .  aspirin EC 81 MG tablet, Take 81 mg by mouth daily., Disp: , Rfl:  .  blood glucose meter kit  and supplies KIT, Dispense based on patient and insurance preference. Use up to four times daily as directed. (FOR ICD-9 250.00, 250.01)., Disp: 1 each, Rfl: 0 .  carvedilol (COREG) 3.125 MG tablet, Take 1 tablet (3.125 mg total) by mouth 2 (two) times daily with a meal., Disp: 180 tablet, Rfl: 3 .  citalopram (CELEXA) 40 MG tablet, Take 40 mg by mouth daily., Disp: , Rfl:  .  doxazosin (CARDURA) 4 MG tablet, Take 4 mg by mouth daily. , Disp: , Rfl:  .  fluticasone (FLONASE) 50 MCG/ACT nasal spray, Place 2 sprays into both nostrils daily. , Disp: , Rfl:  .  furosemide (LASIX) 20 MG tablet, Take 1 tablet (20 mg total) by mouth every other day., Disp: 45 tablet, Rfl: 1 .  gabapentin (NEURONTIN) 100 MG capsule, Take 4 capsules (400 mg total) by mouth 3 (three) times daily., Disp: 30 capsule, Rfl: 0 .  nitroGLYCERIN (NITROSTAT) 0.4 MG SL tablet, PLACE 1 TABLET UNDER THE TONGUE EVERY 5 MINUTES AS NEEDED FOR CHEST PAIN FOR 3 DOSES (Patient taking differently: Place  0.4 mg under the tongue every 5 (five) minutes as needed for chest pain. ), Disp: 25 tablet, Rfl: 0 .  simvastatin (ZOCOR) 40 MG tablet, TAKE 1 TABLET BY MOUTH AT BEDTIME (Patient taking differently: Take 40 mg by mouth at bedtime. ), Disp: 90 tablet, Rfl: 2 .  Tamsulosin HCl (FLOMAX) 0.4 MG CAPS, Take 0.4 mg by mouth daily after supper., Disp: , Rfl:  .  traMADol-acetaminophen (ULTRACET) 37.5-325 MG per tablet, Take 2 tablets by mouth 3 (three) times daily. , Disp: , Rfl:  .  vitamin B-12 1000 MCG tablet, Take 1 tablet (1,000 mcg total) by mouth daily., Disp: 3 tablet, Rfl:    Allergies  Allergen Reactions  . Naproxen Rash  . Clopidogrel Bisulfate Other (See Comments)    H/O diverticulitis; prone to rectal bleeding.  Plavix complicated this.  jkl  . Sulfonamide Derivatives Hives  . Other Other (See Comments)    Narcotics-C. Diff  . Latex Rash  . Tape Itching and Rash    Latex-Adhesive tape     Review of Systems  Constitutional: Negative.   Respiratory: Negative.   Cardiovascular: Negative.  Negative for chest pain, palpitations and leg swelling.  Gastrointestinal: Positive for abdominal pain.  Neurological: Negative for dizziness and headaches.     Today's Vitals   04/15/19 1632  BP: 128/60  Pulse: 90  Temp: (!) 97.5 F (36.4 C)  Weight: 209 lb 9.6 oz (95.1 kg)  Height: 5' 3.6" (1.615 m)   Body mass index is 36.43 kg/m.   Objective:  Physical Exam Constitutional:      General: He is not in acute distress.    Appearance: Normal appearance. He is obese.  Cardiovascular:     Rate and Rhythm: Normal rate and regular rhythm.     Pulses: Normal pulses.     Heart sounds: Normal heart sounds. No murmur.  Pulmonary:     Effort: Pulmonary effort is normal. No respiratory distress.     Breath sounds: Normal breath sounds.  Abdominal:     General: Bowel sounds are normal. There is no distension.     Palpations: Abdomen is soft. There is no mass.     Tenderness: There is no  abdominal tenderness.  Skin:    Capillary Refill: Capillary refill takes less than 2 seconds.  Neurological:     General: No focal deficit present.     Mental Status: He  is alert and oriented to person, place, and time.  Psychiatric:        Mood and Affect: Mood normal.        Behavior: Behavior normal.        Thought Content: Thought content normal.        Judgment: Judgment normal.         Assessment And Plan:     1. Vitamin B12 deficiency  He is doing better with more energy  He is to come back next week for the 4th weekly visit    2. Stress due to family tension  Needs an assessment for safety at home  Also would need PT assessment for energy conservation.  - Ambulatory referral to [facility]  3. Abdominal bloating  He is advised to take a probiotic daily to see if is effective.   Minette Brine, FNP    THE PATIENT IS ENCOURAGED TO PRACTICE SOCIAL DISTANCING DUE TO THE COVID-19 PANDEMIC.

## 2019-04-16 ENCOUNTER — Ambulatory Visit: Payer: Medicare PPO | Attending: Internal Medicine

## 2019-04-16 DIAGNOSIS — Z23 Encounter for immunization: Secondary | ICD-10-CM

## 2019-04-16 NOTE — Progress Notes (Addendum)
   Covid-19 Vaccination Clinic  Name:  Gary [name]    MRN: [id number] DOB: 1932/09/13  04/16/2019  [name] was observed post Covid-19 immunization for 30 minutes based on pre-vaccination screening without incident. He was provided with Vaccine Information Sheet and instruction to access the V-Safe system. Patient complained of racing heart beat, PA assessed, HR 68 strong and regular. Per wife he gets anxious and will do better once he leaves. Sings and symptoms of allergic reaction reviewed with patient and wife and they voice understanding.  [name] was instructed to call 911 with any severe reactions post vaccine: Marland Kitchen Difficulty breathing  . Swelling of face and throat  . A fast heartbeat  . A bad rash all over body  . Dizziness and weakness   Immunizations Administered    Name Date Dose VIS Date Route   Pfizer COVID-19 Vaccine 04/16/2019 11:06 AM 0.3 mL 01/22/2019 Intramuscular   Manufacturer: [place]   Lot: [id number]   [place]: [id number]

## 2019-04-19 ENCOUNTER — Other Ambulatory Visit: Payer: Self-pay

## 2019-04-19 ENCOUNTER — Ambulatory Visit (HOSPITAL_COMMUNITY): Payer: Medicare PPO | Attending: Cardiology

## 2019-04-19 DIAGNOSIS — N1832 Chronic kidney disease, stage 3b: Secondary | ICD-10-CM | POA: Diagnosis present

## 2019-04-19 DIAGNOSIS — I2510 Atherosclerotic heart disease of native coronary artery without angina pectoris: Secondary | ICD-10-CM

## 2019-04-19 DIAGNOSIS — I442 Atrioventricular block, complete: Secondary | ICD-10-CM | POA: Insufficient documentation

## 2019-04-19 DIAGNOSIS — E785 Hyperlipidemia, unspecified: Secondary | ICD-10-CM | POA: Insufficient documentation

## 2019-04-19 DIAGNOSIS — I10 Essential (primary) hypertension: Secondary | ICD-10-CM | POA: Diagnosis not present

## 2019-04-20 ENCOUNTER — Encounter: Payer: Self-pay | Admitting: Internal Medicine

## 2019-04-20 ENCOUNTER — Telehealth (INDEPENDENT_AMBULATORY_CARE_PROVIDER_SITE_OTHER): Payer: Medicare PPO | Admitting: Internal Medicine

## 2019-04-20 VITALS — BP 119/64 | HR 71 | Ht 66.0 in | Wt 204.0 lb

## 2019-04-20 DIAGNOSIS — Z950 Presence of cardiac pacemaker: Secondary | ICD-10-CM

## 2019-04-20 DIAGNOSIS — R0789 Other chest pain: Secondary | ICD-10-CM

## 2019-04-20 DIAGNOSIS — I442 Atrioventricular block, complete: Secondary | ICD-10-CM

## 2019-04-20 DIAGNOSIS — I052 Rheumatic mitral stenosis with insufficiency: Secondary | ICD-10-CM

## 2019-04-20 DIAGNOSIS — E785 Hyperlipidemia, unspecified: Secondary | ICD-10-CM | POA: Diagnosis not present

## 2019-04-20 DIAGNOSIS — I10 Essential (primary) hypertension: Secondary | ICD-10-CM | POA: Diagnosis not present

## 2019-04-20 NOTE — Patient Instructions (Signed)

## 2019-04-20 NOTE — Progress Notes (Signed)
Virtual Visit via Telephone Note   This visit type was conducted due to national recommendations for restrictions regarding the COVID-19 Pandemic (e.g. social distancing) in an effort to limit this patient's exposure and mitigate transmission in our community.  Due to his co-morbid illnesses, this patient is at least at moderate risk for complications without adequate follow up.  This format is felt to be most appropriate for this patient at this time.  The patient did not have access to video technology/had technical difficulties with video requiring transitioning to audio format only (telephone).  All issues noted in this document were discussed and addressed.  No physical exam could be performed with this format.  Please refer to the patient's chart for his  consent to telehealth for [facility].   Evaluation Performed:  Telephone follow-up  Date:  04/20/2019   ID:  Gary [name], DOB [birth year], MRN [id number]  Patient Location:  [address] [name]  Provider location:   [address] [place] [address] [place], [place] [address]  PCP:  Minette Brine, FNP  Cardiologist:  Pixie Casino, MD Electrophysiologist:  Virl Axe, MD   Chief Complaint:  No complaints  History of Present Illness:    Gary [name] is a 86 y.o. male who presents via audio/video conferencing for a telehealth visit today.  Gary [name] returns today for follow-up.  He was recently hospitalized for atypical chest pain.  He underwent repeat nuclear stress testing after negative troponins.  This demonstrated no reversible ischemia however global hypokinesis with an EF of 44%.  Subsequently he was noted to be persistently hypertensive.  He was seen by Roby Lofts, PA-C, who started him on carvedilol in addition to amlodipine.  Blood pressure is now much better controlled today as his heart rate.  He feels well.  He did have a repeat echo yesterday to evaluate his LV dysfunction on Myoview  testing.  The echo demonstrated normal LV function, however he did have a dilated aortic root to 4.2 cm, some mild mitral stenosis and regurgitation as well as some aortic insufficiency.  This will need follow-up.  The patient does not have symptoms concerning for COVID-19 infection (fever, chills, cough, or new SHORTNESS OF BREATH).    Prior CV studies:   The following studies were reviewed today:  Reviewed  PMHx:  Past Medical History:  Diagnosis Date  . Adenomatous colon polyp    2010   . Anemia   . Anxiety   . Arthritis    "hips and lower back"  . Arthritis pain   . Blood transfusion   . Chest pain 03/29/11   2D Echo without contrast on 03/29/11 - EF= 55-60%.  . Cholecystitis   . Chronic back pain greater than 3 months duration   . Chronic kidney disease   . Colon polyps   . Complication of anesthesia    once slow to wake up  . Coronary angioplasty status 2011  . Coronary artery disease   . Diarrhea   . Diverticulosis   . Dyslipidemia   . GERD (gastroesophageal reflux disease)   . GI bleeding after 07/2009   "triggered by Plavix & ASA; from my diverticulosis"  . H/O Clostridium difficile infection   . H/O myocardial perfusion scan 04/04/11   For Pre-noncardiac surgery - EF = 67%, no ischemia, and is considered low risk.  Marland Kitchen Heart attack ([place]) 07/2009   nonSTEMI with an occluded circumflex artery and collaterals.  Marland Kitchen Heart murmur   .  Hemorrhoids   . History of shingles   . Hyperlipidemia   . Hypertension   . Kidney stones   . Lower extremity edema    Taking furosemide and it seems to be helping.  . Neuromuscular disorder ([place])   . Nonspecific chest pain 08/26/2012   Went to ED 08/26/12  . Stomach problems   . Syncope and collapse 04/16/2018    Past Surgical History:  Procedure Laterality Date  . BACK SURGERY  1997  . CARDIAC CATHETERIZATION  08/09/2009   Circumflex 100% occluded with right-to-left collaterals, mild irregularities in the proximal and distal LAD  and diagonal system, normal LV systolic function, and minor infrarenal irregularities, but no abdominal aortic aneurysm.  . CHOLECYSTECTOMY  04/12/2011   Procedure: CHOLECYSTECTOMY;  Surgeon: Adin Hector, MD;  Location: [place];  Service: General;  Laterality: N/A;  . CHOLECYSTECTOMY  04/12/2011   Procedure: LAPAROSCOPIC CHOLECYSTECTOMY;  Surgeon: Adin Hector, MD;  Location: [place];  Service: General;  Laterality: N/A;  converted to open  . COLON SURGERY     COLONOSCOPY  . ESOPHAGOGASTRODUODENOSCOPY N/A 08/05/2013   Procedure: ESOPHAGOGASTRODUODENOSCOPY (EGD);  Surgeon: Jerene Bears, MD;  Location: [place];  Service: Gastroenterology;  Laterality: N/A;  . EYE SURGERY  1942   "right eye was crossed; they  corrected it"  . LUMBAR FUSION  07/22/2012  . PACEMAKER IMPLANT N/A 04/17/2018   Procedure: PACEMAKER IMPLANT;  Surgeon: Deboraha Sprang, MD;  Location: [place] [facility];  Service: Cardiovascular;  Laterality: N/A;  . POSTERIOR FUSION LUMBAR SPINE  08/1995   "bottom 4 vertebra"  . SUBTOTAL COLECTOMY  01/24/2010  . TOTAL HIP ARTHROPLASTY Right 08/03/2013   Procedure: TOTAL HIP ARTHROPLASTY ANTERIOR APPROACH;  Surgeon: Hessie Dibble, MD;  Location: [place];  Service: Orthopedics;  Laterality: Right;    FAMHx:  Family History  Problem Relation Age of Onset  . Heart disease Father   . Stroke Father   . Pancreatic cancer Mother   . Diverticulosis Sister        x4  . Colon cancer Neg Hx     SOCHx:   reports that he has never smoked. He has never used smokeless tobacco. He reports that he does not drink alcohol or use drugs.  ALLERGIES:  Allergies  Allergen Reactions  . Naproxen Rash  . Clopidogrel Bisulfate Other (See Comments)    H/O diverticulitis; prone to rectal bleeding.  Plavix complicated this.  jkl  . Sulfonamide Derivatives Hives  . Other Other (See Comments)    Narcotics-C. Diff  . Latex Rash  . Tape Itching and Rash    Latex-Adhesive tape    MEDS:  Current  Meds  Medication Sig  . acetaminophen (TYLENOL) 500 MG tablet Take 1,000 mg by mouth every 8 (eight) hours as needed for fever.  Marland Kitchen aspirin EC 81 MG tablet Take 81 mg by mouth daily.  . blood glucose meter kit and supplies KIT Dispense based on patient and insurance preference. Use up to four times daily as directed. (FOR ICD-9 250.00, 250.01).  . carvedilol (COREG) 3.125 MG tablet Take 1 tablet (3.125 mg total) by mouth 2 (two) times daily with a meal.  . citalopram (CELEXA) 40 MG tablet Take 40 mg by mouth daily.  . Cyanocobalamin (VITAMIN B-12 IJ) Inject 1 Dose as directed once a week.  . doxazosin (CARDURA) 4 MG tablet Take 4 mg by mouth daily.   . fluticasone (FLONASE) 50 MCG/ACT nasal spray Place 2 sprays  into both nostrils daily.   . furosemide (LASIX) 20 MG tablet Take 1 tablet (20 mg total) by mouth every other day.  . gabapentin (NEURONTIN) 100 MG capsule Take 4 capsules (400 mg total) by mouth 3 (three) times daily.  . nitroGLYCERIN (NITROSTAT) 0.4 MG SL tablet PLACE 1 TABLET UNDER THE TONGUE EVERY 5 MINUTES AS NEEDED FOR CHEST PAIN FOR 3 DOSES (Patient taking differently: Place 0.4 mg under the tongue every 5 (five) minutes as needed for chest pain. )  . simvastatin (ZOCOR) 40 MG tablet TAKE 1 TABLET BY MOUTH AT BEDTIME (Patient taking differently: Take 40 mg by mouth at bedtime. )  . Tamsulosin HCl (FLOMAX) 0.4 MG CAPS Take 0.4 mg by mouth daily after supper.  . traMADol-acetaminophen (ULTRACET) 37.5-325 MG per tablet Take 2 tablets by mouth 3 (three) times daily.   . vitamin B-12 1000 MCG tablet Take 1 tablet (1,000 mcg total) by mouth daily.     ROS: Pertinent items noted in HPI and remainder of comprehensive ROS otherwise negative.  Labs/Other Tests and Data Reviewed:    Recent Labs: 03/27/2019: ALT 14 03/28/2019: TSH 1.817 03/29/2019: BUN 27; Creatinine, Ser 2.07; Hemoglobin 13.7; Platelets 175; Potassium 4.0; Sodium 139   Recent Lipid Panel Lab Results  Component Value  Date/Time   CHOL  08/09/2009 09:49 AM    164        ATP III CLASSIFICATION:  <200     mg/dL   Desirable  200-239  mg/dL   Borderline High  >=240    mg/dL   High          TRIG 114 08/09/2009 09:49 AM   HDL 36 (L) 08/09/2009 09:49 AM   CHOLHDL 4.6 08/09/2009 09:49 AM   LDLCALC (H) 08/09/2009 09:49 AM    105        Total Cholesterol/HDL:CHD Risk Coronary Heart Disease Risk Table                     Men   Women  1/2 Average Risk   3.4   3.3  Average Risk       5.0   4.4  2 X Average Risk   9.6   7.1  3 X Average Risk  23.4   11.0        Use the calculated Patient Ratio above and the CHD Risk Table to determine the patient's CHD Risk.        ATP III CLASSIFICATION (LDL):  <100     mg/dL   Optimal  100-129  mg/dL   Near or Above                    Optimal  130-159  mg/dL   Borderline  160-189  mg/dL   High  >190     mg/dL   Very High    Wt Readings from Last 3 Encounters:  04/20/19 204 lb (92.5 kg)  04/15/19 209 lb 9.6 oz (95.1 kg)  04/08/19 208 lb (94.3 kg)     Exam:    Vital Signs:  BP 119/64   Pulse 71   Ht _0  (1.676 m)   Wt 204 lb (92.5 kg)   BMI 32.93 kg/m    Exam deferred due to telephone visit  ASSESSMENT & PLAN:    1. Atypical chest pain-low risk Myoview stress test 2. LVEF 55 to 60%, mild MS/MR 3. Dilated aortic root to 4.2 cm 4. Complete heart block status post  pacemaker 5. Hypertension 6. Dyslipidemia  Mr. Halladay is doing well after recent hospitalization.  His chest pain was atypical and he ruled out for MI.  There was a low risk stress test and echo showed normal LV function.  He will need follow-up with echo or CT with regards to dilated aortic root.  He is status post pacemaker for complete heart block which is followed by Dr. Caryl Comes and has been stable.  Blood pressure is now much better controlled with the addition of carvedilol.  We will give him a prescription to continue with that.  Plan follow-up with me in 6 months or sooner as  necessary.  COVID-19 Education: The signs and symptoms of COVID-19 were discussed with the patient and how to seek care for testing (follow up with PCP or arrange E-visit).  The importance of social distancing was discussed today.  Patient Risk:   After full review of this patients clinical status, I feel that they are at least moderate risk at this time.  Time:   Today, I have spent 25 minutes with the patient with telehealth technology discussing chest pain, echo results, hypertension, dyslipidemia.     Medication Adjustments/Labs and Tests Ordered: Current medicines are reviewed at length with the patient today.  Concerns regarding medicines are outlined above.   Tests Ordered: No orders of the defined types were placed in this encounter.   Medication Changes: No orders of the defined types were placed in this encounter.   Disposition:  in 6 month(s)  Pixie Casino, MD, [facility], [place] Director of the Advanced Lipid Disorders &  Cardiovascular Risk Reduction Clinic Diplomate of the American Board of Clinical Lipidology Attending Cardiologist  Direct Dial: [phone]  Fax: [phone]  Website:  [email or web address]  Pixie Casino, MD  04/20/2019 4:01 PM

## 2019-04-22 ENCOUNTER — Encounter: Payer: Self-pay | Admitting: Nurse Practitioner

## 2019-04-22 ENCOUNTER — Other Ambulatory Visit: Payer: Self-pay

## 2019-04-22 ENCOUNTER — Ambulatory Visit: Payer: Medicare PPO | Admitting: Nurse Practitioner

## 2019-04-22 ENCOUNTER — Ambulatory Visit: Payer: Medicare PPO | Admitting: Registered"

## 2019-04-22 VITALS — BP 132/88 | HR 78 | Temp 98.4°F | Ht 63.6 in | Wt 208.0 lb

## 2019-04-22 DIAGNOSIS — N184 Chronic kidney disease, stage 4 (severe): Secondary | ICD-10-CM

## 2019-04-22 DIAGNOSIS — R7309 Other abnormal glucose: Secondary | ICD-10-CM | POA: Diagnosis not present

## 2019-04-22 DIAGNOSIS — E538 Deficiency of other specified B group vitamins: Secondary | ICD-10-CM

## 2019-04-22 MED ORDER — CYANOCOBALAMIN 1000 MCG/ML IJ SOLN
1000.0000 ug | Freq: Once | INTRAMUSCULAR | Status: AC
  Administered 2019-04-22: 1000 ug via INTRAMUSCULAR

## 2019-04-22 NOTE — Progress Notes (Signed)
This visit occurred during the SARS-CoV-2 public health emergency.  Safety protocols were in place, including screening questions prior to the visit, additional usage of staff PPE, and extensive cleaning of exam room while observing appropriate contact time as indicated for disinfecting solutions.  Subjective:     Patient ID: Gary [name] , male    DOB: [birth year] , 86 y.o.   MRN: [id number]   Chief Complaint  Patient presents with  . B12 Injection    HPI  He is here for 4 of 4 weekly vitamin B12. He does feel a boost of energy right after.  He is using a cpap so he is trying to get used to that and wants to be able to sleep all the way through the night.  He is using the one with the ear prongs.    Had his first covid vaccine only had soreness to the arm his next one is due on March 26th, went to [facility]        Past Medical History:  Diagnosis Date  . Adenomatous colon polyp    2010   . Anemia   . Anxiety   . Arthritis    "hips and lower back"  . Arthritis pain   . Blood transfusion   . Chest pain 03/29/11   2D Echo without contrast on 03/29/11 - EF= 55-60%.  . Cholecystitis   . Chronic back pain greater than 3 months duration   . Chronic kidney disease   . Colon polyps   . Complication of anesthesia    once slow to wake up  . Coronary angioplasty status 2011  . Coronary artery disease   . Diarrhea   . Diverticulosis   . Dyslipidemia   . GERD (gastroesophageal reflux disease)   . GI bleeding after 07/2009   "triggered by Plavix & ASA; from my diverticulosis"  . H/O Clostridium difficile infection   . H/O myocardial perfusion scan 04/04/11   For Pre-noncardiac surgery - EF = 67%, no ischemia, and is considered low risk.  Marland Kitchen Heart attack ([place]) 07/2009   nonSTEMI with an occluded circumflex artery and collaterals.  Marland Kitchen Heart murmur   . Hemorrhoids   . History of shingles   . Hyperlipidemia   . Hypertension   . Kidney stones   . Lower extremity edema    Taking  furosemide and it seems to be helping.  . Neuromuscular disorder ([place])   . Nonspecific chest pain 08/26/2012   Went to ED 08/26/12  . Stomach problems   . Syncope and collapse 04/16/2018     Family History  Problem Relation Age of Onset  . Heart disease Father   . Stroke Father   . Pancreatic cancer Mother   . Diverticulosis Sister        x4  . Colon cancer Neg Hx      Current Outpatient Medications:  .  acetaminophen (TYLENOL) 500 MG tablet, Take 1,000 mg by mouth every 8 (eight) hours as needed for fever., Disp: , Rfl:  .  amLODipine (NORVASC) 5 MG tablet, Take 1 tablet (5 mg total) by mouth daily., Disp: 90 tablet, Rfl: 3 .  aspirin EC 81 MG tablet, Take 81 mg by mouth daily., Disp: , Rfl:  .  blood glucose meter kit and supplies KIT, Dispense based on patient and insurance preference. Use up to four times daily as directed. (FOR ICD-9 250.00, 250.01)., Disp: 1 each, Rfl: 0 .  carvedilol (COREG) 3.125 MG tablet, Take 1  tablet (3.125 mg total) by mouth 2 (two) times daily with a meal., Disp: 180 tablet, Rfl: 3 .  citalopram (CELEXA) 40 MG tablet, Take 40 mg by mouth daily., Disp: , Rfl:  .  Cyanocobalamin (VITAMIN B-12 IJ), Inject 1 Dose as directed once a week., Disp: , Rfl:  .  doxazosin (CARDURA) 4 MG tablet, Take 4 mg by mouth daily. , Disp: , Rfl:  .  fluticasone (FLONASE) 50 MCG/ACT nasal spray, Place 2 sprays into both nostrils daily. , Disp: , Rfl:  .  furosemide (LASIX) 20 MG tablet, Take 1 tablet (20 mg total) by mouth every other day., Disp: 45 tablet, Rfl: 1 .  gabapentin (NEURONTIN) 100 MG capsule, Take 4 capsules (400 mg total) by mouth 3 (three) times daily., Disp: 30 capsule, Rfl: 0 .  nitroGLYCERIN (NITROSTAT) 0.4 MG SL tablet, PLACE 1 TABLET UNDER THE TONGUE EVERY 5 MINUTES AS NEEDED FOR CHEST PAIN FOR 3 DOSES (Patient taking differently: Place 0.4 mg under the tongue every 5 (five) minutes as needed for chest pain. ), Disp: 25 tablet, Rfl: 0 .  simvastatin (ZOCOR)  40 MG tablet, TAKE 1 TABLET BY MOUTH AT BEDTIME (Patient taking differently: Take 40 mg by mouth at bedtime. ), Disp: 90 tablet, Rfl: 2 .  Tamsulosin HCl (FLOMAX) 0.4 MG CAPS, Take 0.4 mg by mouth daily after supper., Disp: , Rfl:  .  traMADol-acetaminophen (ULTRACET) 37.5-325 MG per tablet, Take 2 tablets by mouth 3 (three) times daily. , Disp: , Rfl:  .  vitamin B-12 1000 MCG tablet, Take 1 tablet (1,000 mcg total) by mouth daily., Disp: 3 tablet, Rfl:   Current Facility-Administered Medications:  .  cyanocobalamin ((VITAMIN B-12)) injection 1,000 mcg, 1,000 mcg, Intramuscular, Once, Minette Brine, FNP   Allergies  Allergen Reactions  . Naproxen Rash  . Clopidogrel Bisulfate Other (See Comments)    H/O diverticulitis; prone to rectal bleeding.  Plavix complicated this.  jkl  . Sulfonamide Derivatives Hives  . Other Other (See Comments)    Narcotics-C. Diff  . Latex Rash  . Tape Itching and Rash    Latex-Adhesive tape     Review of Systems  Constitutional: Negative.   Respiratory: Negative.   Cardiovascular: Negative.  Negative for chest pain, palpitations and leg swelling.  Neurological: Negative for dizziness.     Today's Vitals   04/22/19 1431  BP: 132/88  Pulse: 78  Temp: 98.4 F (36.9 C)  TempSrc: Oral  Weight: 208 lb (94.3 kg)  Height: 5' 3.6" (1.615 m)  PainSc: 0-No pain   Body mass index is 36.15 kg/m.   Objective:  Physical Exam Constitutional:      General: He is not in acute distress.    Appearance: Normal appearance. He is obese.  Cardiovascular:     Rate and Rhythm: Normal rate and regular rhythm.     Pulses: Normal pulses.     Heart sounds: Normal heart sounds. No murmur.  Pulmonary:     Effort: Pulmonary effort is normal. No respiratory distress.     Breath sounds: Normal breath sounds.  Abdominal:     General: Bowel sounds are normal. There is no distension.     Palpations: Abdomen is soft. There is no mass.     Tenderness: There is no  abdominal tenderness.  Skin:    Capillary Refill: Capillary refill takes less than 2 seconds.  Neurological:     General: No focal deficit present.     Mental Status: He is alert  and oriented to person, place, and time.  Psychiatric:        Mood and Affect: Mood normal.        Behavior: Behavior normal.        Thought Content: Thought content normal.        Judgment: Judgment normal.         Assessment And Plan:   1. Vitamin B12 deficiency  4th of 4 weekly vitamin B12  Will obtain levels  He is having good benefit, will do again in 2 weeks - Vitamin B12 - cyanocobalamin ((VITAMIN B-12)) injection 1,000 mcg  2. Abnormal glucose  Will check glucose level and HgbA1c in 1-2 months - BMP8+eGFR  3. Stage 4 chronic kidney disease ([place])  Awaiting seeing Nephrology - BMP8+eGFR   Minette Brine, FNP    THE PATIENT IS ENCOURAGED TO PRACTICE SOCIAL DISTANCING DUE TO THE COVID-19 PANDEMIC.

## 2019-04-23 ENCOUNTER — Ambulatory Visit: Payer: Self-pay

## 2019-04-23 ENCOUNTER — Encounter: Payer: Self-pay | Admitting: Nurse Practitioner

## 2019-04-23 DIAGNOSIS — I2510 Atherosclerotic heart disease of native coronary artery without angina pectoris: Secondary | ICD-10-CM

## 2019-04-23 DIAGNOSIS — N1832 Chronic kidney disease, stage 3b: Secondary | ICD-10-CM

## 2019-04-23 LAB — BMP8+EGFR
BUN/Creatinine Ratio: 13 (ref 10–24)
BUN: 22 mg/dL (ref 8–27)
CO2: 21 mmol/L (ref 20–29)
Calcium: 9.4 mg/dL (ref 8.6–10.2)
Chloride: 102 mmol/L (ref 96–106)
Creatinine, Ser: 1.63 mg/dL — ABNORMAL HIGH (ref 0.76–1.27)
GFR calc Af Amer: 44 mL/min/{1.73_m2} — ABNORMAL LOW (ref >59)
GFR calc non Af Amer: 38 mL/min/{1.73_m2} — ABNORMAL LOW (ref >59)
Glucose: 123 mg/dL — ABNORMAL HIGH (ref 65–99)
Potassium: 5 mmol/L (ref 3.5–5.2)
Sodium: 143 mmol/L (ref 134–144)

## 2019-04-23 LAB — VITAMIN B12: Vitamin B-12: 525 pg/mL (ref 232–1245)

## 2019-04-23 NOTE — Patient Instructions (Signed)
Social Worker Visit Information  Goals we discussed today:  Goals Addressed            This Visit's Progress   . Collaborate with RN Case Manager to assist with care coordination needs       CARE PLAN ENTRY (see longtitudinal plan of care for additional care plan information)  Current Barriers:  . ADL IADL limitations due to pulmonary hypertension and coronary atherosclerosis . Family and relationship dysfunction  Social Work Clinical Goal(s):  Marland Kitchen Over the next 90 days, patient will work with care management team to address needs related to care coordination  CCM SW Interventions: Completed 04/23/19 with patient and his wife Lelon Frohlich . Outbound call placed to the patient to assess progression of patient goal . Determined the patient has yet to be contacted by home health agency . Performed chart review to note orders placed on 04/15/19 with no agency accepting at this time . Advised Mrs. Mancias, SW would collaborate with referral coordinator on Monday to assess referral status  Patient Self Care Activities:  . Patient verbalizes understanding of plan to work with care management team to assist with care coordination . Self administers medications as prescribed . Attends all scheduled provider appointments . Calls provider office for new concerns or questions  Please see past updates related to this goal by clicking on the "Past Updates" button in the selected goal          Follow Up Plan: SW will follow up with patient by phone over the next three weeks.   Daneen Schick, BSW, CDP Social Worker, Certified Dementia Practitioner [place] [facility] [place] [place] [facility] [phone]

## 2019-04-23 NOTE — Chronic Care Management (AMB) (Signed)
Chronic Care Management    Social Work Follow Up Note  04/23/2019 Name: Gary [name] MRN: [id number] DOB: 1932-09-12  Gary [name] is a 86 y.o. year old male who is a primary care patient of Gary [name], [place]. The CCM team was consulted for assistance with care coordination.   Review of patient status, including review of consultants reports, other relevant assessments, and collaboration with appropriate care team members and the patient's provider was performed as part of comprehensive patient evaluation and provision of chronic care management services.    SDOH (Social Determinants of Health) assessments performed: No    Outpatient Encounter Medications as of 04/23/2019  Medication Sig  . acetaminophen (TYLENOL) 500 MG tablet Take 1,000 mg by mouth every 8 (eight) hours as needed for fever.  Marland Kitchen amLODipine (NORVASC) 5 MG tablet Take 1 tablet (5 mg total) by mouth daily.  Marland Kitchen aspirin EC 81 MG tablet Take 81 mg by mouth daily.  . blood glucose meter kit and supplies KIT Dispense based on patient and insurance preference. Use up to four times daily as directed. (FOR ICD-9 250.00, 250.01).  . carvedilol (COREG) 3.125 MG tablet Take 1 tablet (3.125 mg total) by mouth 2 (two) times daily with a meal.  . citalopram (CELEXA) 40 MG tablet Take 40 mg by mouth daily.  . Cyanocobalamin (VITAMIN B-12 IJ) Inject 1 Dose as directed once a week.  . doxazosin (CARDURA) 4 MG tablet Take 4 mg by mouth daily.   . fluticasone (FLONASE) 50 MCG/ACT nasal spray Place 2 sprays into both nostrils daily.   . furosemide (LASIX) 20 MG tablet Take 1 tablet (20 mg total) by mouth every other day.  . gabapentin (NEURONTIN) 100 MG capsule Take 4 capsules (400 mg total) by mouth 3 (three) times daily.  . nitroGLYCERIN (NITROSTAT) 0.4 MG SL tablet PLACE 1 TABLET UNDER THE TONGUE EVERY 5 MINUTES AS NEEDED FOR CHEST PAIN FOR 3 DOSES (Patient taking differently: Place 0.4 mg under the tongue every 5 (five) minutes as needed  for chest pain. )  . simvastatin (ZOCOR) 40 MG tablet TAKE 1 TABLET BY MOUTH AT BEDTIME (Patient taking differently: Take 40 mg by mouth at bedtime. )  . Tamsulosin HCl (FLOMAX) 0.4 MG CAPS Take 0.4 mg by mouth daily after supper.  . traMADol-acetaminophen (ULTRACET) 37.5-325 MG per tablet Take 2 tablets by mouth 3 (three) times daily.   . vitamin B-12 1000 MCG tablet Take 1 tablet (1,000 mcg total) by mouth daily.   No facility-administered encounter medications on file as of 04/23/2019.     Goals Addressed            This Visit's Progress   . Collaborate with RN Case Manager to assist with care coordination needs       CARE PLAN ENTRY (see longtitudinal plan of care for additional care plan information)  Current Barriers:  . ADL IADL limitations due to pulmonary hypertension and coronary atherosclerosis . Family and relationship dysfunction  Social Work Clinical Goal(s):  Marland Kitchen Over the next 90 days, patient will work with care management team to address needs related to care coordination  CCM SW Interventions: Completed 04/23/19 with patient and his wife Gary [name] . Outbound call placed to the patient to assess progression of patient goal . Determined the patient has yet to be contacted by home health agency . Performed chart review to note orders placed on 04/15/19 with no agency accepting at this time . Advised [name], SW would collaborate  with referral coordinator on Monday to assess referral status  Patient Self Care Activities:  . Patient verbalizes understanding of plan to work with care management team to assist with care coordination . Self administers medications as prescribed . Attends all scheduled provider appointments . Calls provider office for new concerns or questions  Please see past updates related to this goal by clicking on the "Past Updates" button in the selected goal          Follow Up Plan: SW will follow up with patient by phone over the next three  weeks.  Gary [name], BSW, CDP Social Worker, Certified Dementia Practitioner [place] [facility] [place] [facility] [phone]  Total time spent performing care coordination and/or care management activities with the patient by phone or face to face = 8 minutes.

## 2019-04-26 ENCOUNTER — Ambulatory Visit: Payer: Self-pay

## 2019-04-26 DIAGNOSIS — I10 Essential (primary) hypertension: Secondary | ICD-10-CM

## 2019-04-26 NOTE — Patient Instructions (Signed)
Social Worker Visit Information  Goals we discussed today:  Goals Addressed            This Visit's Progress   . Collaborate with RN Case Manager to assist with care coordination needs       CARE PLAN ENTRY (see longtitudinal plan of care for additional care plan information)  Current Barriers:  . ADL IADL limitations due to pulmonary hypertension and coronary atherosclerosis . Family and relationship dysfunction  Social Work Clinical Goal(s):  Marland Kitchen Over the next 90 days, patient will work with care management team to address needs related to care coordination  CCM SW Interventions: Completed 04/26/19 with patient and his wife Lelon Frohlich . Outbound call placed to the patients spouse, Lelon Frohlich, in response to a voice message received requesting a return call o Informed by Lelon Frohlich the patient has yet to be contacted by home health and is in need of education on how to check BP and CBG in the home . Performed chart review to note home health order placed on 04/15/19 . Advised Mrs. [place], SW would contact provider regarding order status . Collaboration with Fransisco Beau, CMA requesting follow up on home health orders that were placed on 04/15/19  Patient Self Care Activities:  . Patient verbalizes understanding of plan to work with care management team to assist with care coordination . Self administers medications as prescribed . Attends all scheduled provider appointments . Calls provider office for new concerns or questions  Please see past updates related to this goal by clicking on the "Past Updates" button in the selected goal          Follow Up Plan: SW will follow up with patient by phone over the next 3 weeks   Daneen Schick, BSW, CDP Social Worker, Certified Dementia Practitioner [facility] [phone]

## 2019-04-26 NOTE — Chronic Care Management (AMB) (Signed)
Chronic Care Management    Social Work Follow Up Note  04/26/2019 Name: Gary [name] MRN: [id number] DOB: 01/06/1933  Gary [name] is a 86 y.o. year old male who is a primary care patient of Minette Brine, [place]. The CCM team was consulted for assistance with care coordination.   Review of patient status, including review of consultants reports, other relevant assessments, and collaboration with appropriate care team members and the patient's provider was performed as part of comprehensive patient evaluation and provision of chronic care management services.    SDOH (Social Determinants of Health) assessments performed: No    Outpatient Encounter Medications as of 04/26/2019  Medication Sig  . acetaminophen (TYLENOL) 500 MG tablet Take 1,000 mg by mouth every 8 (eight) hours as needed for fever.  Marland Kitchen amLODipine (NORVASC) 5 MG tablet Take 1 tablet (5 mg total) by mouth daily.  Marland Kitchen aspirin EC 81 MG tablet Take 81 mg by mouth daily.  . blood glucose meter kit and supplies KIT Dispense based on patient and insurance preference. Use up to four times daily as directed. (FOR ICD-9 250.00, 250.01).  . carvedilol (COREG) 3.125 MG tablet Take 1 tablet (3.125 mg total) by mouth 2 (two) times daily with a meal.  . citalopram (CELEXA) 40 MG tablet Take 40 mg by mouth daily.  . Cyanocobalamin (VITAMIN B-12 IJ) Inject 1 Dose as directed once a week.  . doxazosin (CARDURA) 4 MG tablet Take 4 mg by mouth daily.   . fluticasone (FLONASE) 50 MCG/ACT nasal spray Place 2 sprays into both nostrils daily.   . furosemide (LASIX) 20 MG tablet Take 1 tablet (20 mg total) by mouth every other day.  . gabapentin (NEURONTIN) 100 MG capsule Take 4 capsules (400 mg total) by mouth 3 (three) times daily.  . nitroGLYCERIN (NITROSTAT) 0.4 MG SL tablet PLACE 1 TABLET UNDER THE TONGUE EVERY 5 MINUTES AS NEEDED FOR CHEST PAIN FOR 3 DOSES (Patient taking differently: Place 0.4 mg under the tongue every 5 (five) minutes as needed  for chest pain. )  . simvastatin (ZOCOR) 40 MG tablet TAKE 1 TABLET BY MOUTH AT BEDTIME (Patient taking differently: Take 40 mg by mouth at bedtime. )  . Tamsulosin HCl (FLOMAX) 0.4 MG CAPS Take 0.4 mg by mouth daily after supper.  . traMADol-acetaminophen (ULTRACET) 37.5-325 MG per tablet Take 2 tablets by mouth 3 (three) times daily.   . vitamin B-12 1000 MCG tablet Take 1 tablet (1,000 mcg total) by mouth daily.   No facility-administered encounter medications on file as of 04/26/2019.     Goals Addressed            This Visit's Progress   . Collaborate with RN Case Manager to assist with care coordination needs       CARE PLAN ENTRY (see longtitudinal plan of care for additional care plan information)  Current Barriers:  . ADL IADL limitations due to pulmonary hypertension and coronary atherosclerosis . Family and relationship dysfunction  Social Work Clinical Goal(s):  Marland Kitchen Over the next 90 days, patient will work with care management team to address needs related to care coordination  CCM SW Interventions: Completed 04/26/19 with patient and his wife Lelon Frohlich . Outbound call placed to the patients spouse, Lelon Frohlich, in response to a voice message received requesting a return call o Informed by Lelon Frohlich the patient has yet to be contacted by home health and is in need of education on how to check BP and CBG in the home .  Performed chart review to note home health order placed on 04/15/19 . Advised Mrs. [place], SW would contact provider regarding order status . Collaboration with Fransisco Beau, CMA requesting follow up on home health orders that were placed on 04/15/19  Patient Self Care Activities:  . Patient verbalizes understanding of plan to work with care management team to assist with care coordination . Self administers medications as prescribed . Attends all scheduled provider appointments . Calls provider office for new concerns or questions  Please see past updates related to this goal  by clicking on the "Past Updates" button in the selected goal          Follow Up Plan: SW will follow up with patient by phone over the next 3 weeks   Daneen Schick, BSW, CDP Social Worker, Certified Dementia Practitioner [place] [facility] [place] [phone]  Total time spent performing care coordination and/or care management activities with the patient by phone or face to face = 8 minutes.

## 2019-04-30 DIAGNOSIS — E538 Deficiency of other specified B group vitamins: Secondary | ICD-10-CM | POA: Diagnosis not present

## 2019-04-30 DIAGNOSIS — N184 Chronic kidney disease, stage 4 (severe): Secondary | ICD-10-CM | POA: Diagnosis not present

## 2019-04-30 DIAGNOSIS — F411 Generalized anxiety disorder: Secondary | ICD-10-CM

## 2019-04-30 DIAGNOSIS — G8929 Other chronic pain: Secondary | ICD-10-CM

## 2019-04-30 DIAGNOSIS — I272 Pulmonary hypertension, unspecified: Secondary | ICD-10-CM

## 2019-04-30 DIAGNOSIS — M479 Spondylosis, unspecified: Secondary | ICD-10-CM

## 2019-04-30 DIAGNOSIS — I129 Hypertensive chronic kidney disease with stage 1 through stage 4 chronic kidney disease, or unspecified chronic kidney disease: Secondary | ICD-10-CM | POA: Diagnosis not present

## 2019-04-30 DIAGNOSIS — M160 Bilateral primary osteoarthritis of hip: Secondary | ICD-10-CM

## 2019-04-30 DIAGNOSIS — K5730 Diverticulosis of large intestine without perforation or abscess without bleeding: Secondary | ICD-10-CM

## 2019-04-30 DIAGNOSIS — I480 Paroxysmal atrial fibrillation: Secondary | ICD-10-CM

## 2019-04-30 DIAGNOSIS — I2510 Atherosclerotic heart disease of native coronary artery without angina pectoris: Secondary | ICD-10-CM

## 2019-04-30 DIAGNOSIS — E1122 Type 2 diabetes mellitus with diabetic chronic kidney disease: Secondary | ICD-10-CM | POA: Diagnosis not present

## 2019-05-05 ENCOUNTER — Ambulatory Visit: Payer: Medicare PPO | Admitting: Dermatology

## 2019-05-05 ENCOUNTER — Encounter: Payer: Self-pay | Admitting: Medical

## 2019-05-06 ENCOUNTER — Ambulatory Visit: Payer: Medicare PPO | Admitting: Nurse Practitioner

## 2019-05-06 ENCOUNTER — Encounter: Payer: Self-pay | Admitting: Nurse Practitioner

## 2019-05-07 ENCOUNTER — Ambulatory Visit: Payer: Medicare PPO | Attending: Internal Medicine

## 2019-05-07 DIAGNOSIS — Z23 Encounter for immunization: Secondary | ICD-10-CM

## 2019-05-07 NOTE — Progress Notes (Signed)
   Covid-19 Vaccination Clinic  Name:  QUAMERE MUSSELL    MRN: [id number] DOB: 1932-03-18  05/07/2019  Mr. Mordan was observed post Covid-19 immunization for 30 minutes based on pre-vaccination screening without incident. He was provided with Vaccine Information Sheet and instruction to access the V-Safe system.   Mr. Covington was instructed to call 911 with any severe reactions post vaccine: Marland Kitchen Difficulty breathing  . Swelling of face and throat  . A fast heartbeat  . A bad rash all over body  . Dizziness and weakness   Immunizations Administered    Name Date Dose VIS Date Route   Pfizer COVID-19 Vaccine 05/07/2019 10:31 AM 0.3 mL 01/22/2019 Intramuscular   Manufacturer: [place]   Lot: [id number]   [place]: [id number]

## 2019-05-12 ENCOUNTER — Ambulatory Visit: Payer: Self-pay

## 2019-05-12 ENCOUNTER — Telehealth: Payer: Self-pay

## 2019-05-12 ENCOUNTER — Other Ambulatory Visit: Payer: Self-pay

## 2019-05-12 DIAGNOSIS — R7309 Other abnormal glucose: Secondary | ICD-10-CM

## 2019-05-12 DIAGNOSIS — I10 Essential (primary) hypertension: Secondary | ICD-10-CM

## 2019-05-12 DIAGNOSIS — I272 Pulmonary hypertension, unspecified: Secondary | ICD-10-CM

## 2019-05-12 DIAGNOSIS — I2510 Atherosclerotic heart disease of native coronary artery without angina pectoris: Secondary | ICD-10-CM

## 2019-05-12 DIAGNOSIS — N1832 Chronic kidney disease, stage 3b: Secondary | ICD-10-CM

## 2019-05-12 NOTE — Addendum Note (Signed)
Addended by: Freada Bergeron on: 05/12/2019 04:40 PM   Modules accepted: Orders

## 2019-05-12 NOTE — Telephone Encounter (Addendum)
PATIENT HAS BEEN CHANGED TO  [facility] for DME.  Order for mask fit sent to [facility].

## 2019-05-13 ENCOUNTER — Ambulatory Visit: Payer: Medicare PPO | Admitting: Nurse Practitioner

## 2019-05-13 ENCOUNTER — Encounter: Payer: Self-pay | Admitting: Nurse Practitioner

## 2019-05-13 VITALS — BP 148/82 | HR 89 | Temp 97.8°F | Ht 63.6 in | Wt 208.0 lb

## 2019-05-13 DIAGNOSIS — E538 Deficiency of other specified B group vitamins: Secondary | ICD-10-CM | POA: Diagnosis not present

## 2019-05-13 DIAGNOSIS — G8929 Other chronic pain: Secondary | ICD-10-CM

## 2019-05-13 DIAGNOSIS — M545 Low back pain: Secondary | ICD-10-CM

## 2019-05-13 MED ORDER — CYANOCOBALAMIN 1000 MCG/ML IJ SOLN
1000.0000 ug | Freq: Once | INTRAMUSCULAR | Status: AC
  Administered 2019-05-13: 16:00:00 1000 ug via INTRAMUSCULAR

## 2019-05-13 NOTE — Progress Notes (Signed)
This visit occurred during the SARS-CoV-2 public health emergency.  Safety protocols were in place, including screening questions prior to the visit, additional usage of staff PPE, and extensive cleaning of exam room while observing appropriate contact time as indicated for disinfecting solutions.  Subjective:     Patient ID: Gary [name] , male    DOB: August 29, 1932 , 86 y.o.   MRN: [id number]   Chief Complaint  Patient presents with  . B12 Injection    HPI  Here for vitamin B12 injection.  Still working on drinking more water. He will get up about 3 times a night to sleep.    He had his last covid shot last Friday.   He is scheduled to start back on his Physical Therapy on April 19th.   His blood sugar was 132     Past Medical History:  Diagnosis Date  . Adenomatous colon polyp    2010   . Anemia   . Anxiety   . Arthritis    "hips and lower back"  . Arthritis pain   . Blood transfusion   . Chest pain 03/29/11   2D Echo without contrast on 03/29/11 - EF= 55-60%.  . Cholecystitis   . Chronic back pain greater than 3 months duration   . Chronic kidney disease   . Colon polyps   . Complication of anesthesia    once slow to wake up  . Coronary angioplasty status 2011  . Coronary artery disease   . Diarrhea   . Diverticulosis   . Dyslipidemia   . GERD (gastroesophageal reflux disease)   . GI bleeding after 07/2009   "triggered by Plavix & ASA; from my diverticulosis"  . H/O Clostridium difficile infection   . H/O myocardial perfusion scan 04/04/11   For Pre-noncardiac surgery - EF = 67%, no ischemia, and is considered low risk.  Marland Kitchen Heart attack ([place]) 07/2009   nonSTEMI with an occluded circumflex artery and collaterals.  Marland Kitchen Heart murmur   . Hemorrhoids   . History of shingles   . Hyperlipidemia   . Hypertension   . Kidney stones   . Lower extremity edema    Taking furosemide and it seems to be helping.  . Neuromuscular disorder ([place])   . Nonspecific chest pain  08/26/2012   Went to ED 08/26/12  . Stomach problems   . Syncope and collapse 04/16/2018     Family History  Problem Relation Age of Onset  . Heart disease Father   . Stroke Father   . Pancreatic cancer Mother   . Diverticulosis Sister        x4  . Colon cancer Neg Hx      Current Outpatient Medications:  .  acetaminophen (TYLENOL) 500 MG tablet, Take 1,000 mg by mouth every 8 (eight) hours as needed for fever., Disp: , Rfl:  .  amLODipine (NORVASC) 5 MG tablet, Take 1 tablet (5 mg total) by mouth daily., Disp: 90 tablet, Rfl: 3 .  aspirin EC 81 MG tablet, Take 81 mg by mouth daily., Disp: , Rfl:  .  blood glucose meter kit and supplies KIT, Dispense based on patient and insurance preference. Use up to four times daily as directed. (FOR ICD-9 250.00, 250.01)., Disp: 1 each, Rfl: 0 .  carvedilol (COREG) 3.125 MG tablet, Take 1 tablet (3.125 mg total) by mouth 2 (two) times daily with a meal., Disp: 180 tablet, Rfl: 3 .  citalopram (CELEXA) 40 MG tablet, Take 40 mg by  mouth daily., Disp: , Rfl:  .  Cyanocobalamin (VITAMIN B-12 IJ), Inject 1 Dose as directed once a week., Disp: , Rfl:  .  doxazosin (CARDURA) 4 MG tablet, Take 4 mg by mouth daily. , Disp: , Rfl:  .  fluticasone (FLONASE) 50 MCG/ACT nasal spray, Place 2 sprays into both nostrils daily. , Disp: , Rfl:  .  furosemide (LASIX) 20 MG tablet, Take 1 tablet (20 mg total) by mouth every other day., Disp: 45 tablet, Rfl: 1 .  gabapentin (NEURONTIN) 100 MG capsule, Take 4 capsules (400 mg total) by mouth 3 (three) times daily., Disp: 30 capsule, Rfl: 0 .  nitroGLYCERIN (NITROSTAT) 0.4 MG SL tablet, PLACE 1 TABLET UNDER THE TONGUE EVERY 5 MINUTES AS NEEDED FOR CHEST PAIN FOR 3 DOSES (Patient taking differently: Place 0.4 mg under the tongue every 5 (five) minutes as needed for chest pain. ), Disp: 25 tablet, Rfl: 0 .  simvastatin (ZOCOR) 40 MG tablet, TAKE 1 TABLET BY MOUTH AT BEDTIME (Patient taking differently: Take 40 mg by mouth at  bedtime. ), Disp: 90 tablet, Rfl: 2 .  Tamsulosin HCl (FLOMAX) 0.4 MG CAPS, Take 0.4 mg by mouth daily after supper., Disp: , Rfl:  .  traMADol-acetaminophen (ULTRACET) 37.5-325 MG per tablet, Take 2 tablets by mouth 3 (three) times daily. , Disp: , Rfl:  .  vitamin B-12 1000 MCG tablet, Take 1 tablet (1,000 mcg total) by mouth daily., Disp: 3 tablet, Rfl:    Allergies  Allergen Reactions  . Naproxen Rash  . Clopidogrel Bisulfate Other (See Comments)    H/O diverticulitis; prone to rectal bleeding.  Plavix complicated this.  jkl  . Sulfonamide Derivatives Hives  . Other Other (See Comments)    Narcotics-C. Diff  . Latex Rash  . Tape Itching and Rash    Latex-Adhesive tape     Review of Systems  Constitutional: Negative.   Respiratory: Negative.   Cardiovascular: Negative.  Negative for chest pain, palpitations and leg swelling.  Neurological: Negative for dizziness and headaches.  Psychiatric/Behavioral: Negative.      Today's Vitals   05/13/19 1608  BP: (!) 148/82  Pulse: 89  Temp: 97.8 F (36.6 C)  TempSrc: Oral  Weight: 208 lb (94.3 kg)  Height: 5' 3.6" (1.615 m)   Body mass index is 36.15 kg/m.   Objective:  Physical Exam Constitutional:      General: He is not in acute distress.    Appearance: Normal appearance.  Cardiovascular:     Rate and Rhythm: Normal rate and regular rhythm.     Pulses: Normal pulses.     Heart sounds: Normal heart sounds. No murmur.  Pulmonary:     Effort: Pulmonary effort is normal. No respiratory distress.     Breath sounds: Normal breath sounds.  Neurological:     General: No focal deficit present.     Mental Status: He is alert and oriented to person, place, and time.  Psychiatric:        Mood and Affect: Mood normal.        Behavior: Behavior normal.        Thought Content: Thought content normal.        Judgment: Judgment normal.         Assessment And Plan:      1. Vitamin B12 deficiency  Tolerating vitamin B12  injections well  He is to return in 2 weeks for another vitamin B12 injection then will go to once a month. -  cyanocobalamin ((VITAMIN B-12)) injection 1,000 mcg  2. Chronic bilateral low back pain without sciatica  Encouraged to stretch when possible  There has already been a PT referral placed   Minette Brine, FNP    THE PATIENT IS ENCOURAGED TO PRACTICE SOCIAL DISTANCING DUE TO THE COVID-19 PANDEMIC.

## 2019-05-13 NOTE — Patient Instructions (Signed)
Return in 2 weeks for Vitamin B12 injection and then one month in May we will check your labs at that time.

## 2019-05-14 ENCOUNTER — Telehealth: Payer: Self-pay

## 2019-05-17 ENCOUNTER — Encounter: Payer: Self-pay | Admitting: Nurse Practitioner

## 2019-05-17 ENCOUNTER — Ambulatory Visit: Payer: Self-pay

## 2019-05-17 ENCOUNTER — Telehealth: Payer: Self-pay

## 2019-05-17 DIAGNOSIS — I10 Essential (primary) hypertension: Secondary | ICD-10-CM

## 2019-05-17 DIAGNOSIS — N1832 Chronic kidney disease, stage 3b: Secondary | ICD-10-CM

## 2019-05-17 NOTE — Patient Instructions (Signed)
Visit Information  Goals Addressed      Patient Stated   . "I need someone to help me check my blood sugars" (pt-stated)       CARE PLAN ENTRY (see longtitudinal plan of care for additional care plan information)  Current Barriers:  Marland Kitchen Knowledge Deficits related to disease process and Self Health management of Abnormal glucose . Lacks caregiver support.  . Chronic Disease Management support and education needs related to CKD stage 3, Abnormal glucose, Pulmonary Hypertension, coronary Atherosclerosis  Nurse Case Manager Clinical Goal(s):  Marland Kitchen Over the next 90 days, patient will work with the [place] and PCP to address needs related to disease education and support to improve Self management of DM and lower A1c <7.0  CCM RN CM Interventions:  05/13/19 call completed with patient and wife  . Evaluation of current treatment plan related to Abnormal glucose and patient's adherence to plan as established by provider . Provided education to patient re: current A1c of 7.4; Educated on goal A1c <7.0 and how to achieve and maintain this goal with medication adherence, ADA diet adherence, exercise with 150 minutes per week recommended; achieving/maintaining healthy weight; Educated on daily glycemic goal of FBS 80-130 and <180 after meals; 15"15" rule . Determined patient is unable to Self check his CBG's at home due to having poor dexterity in his hands; his wife has severe tremors  . Sent in basket message to embedded BSW Daneen Schick requesting any available resources such as the [facility] and [facility] that may be a good option for patient to receive assistance with monitoring CBG's . Determined patient's daughter whom lives with the patient is unable to help check his CBG's; patient will consider asking a neighbor who is a nurse to help out with checking CBG's  . Reviewed medications with patient and discussed indication, dosage and frequency of prescribed medications; Determined patient is not  currently prescribed antidiabetic medications . Discussed plans with patient for ongoing care management follow up and provided patient with direct contact information for care management team . Provided patient with printed educational materials related to Diabetes Management using the Plate Method; Diabetes Zone Safety Tool; signs/symptoms of Hyper/Hypoglycemia; Carb Choices . Reviewed scheduled/upcoming provider appointments including: PCP follow up with Minette Brine FNP scheduled for 06/03/19 @2 :30 PM   Patient Self Care Activities:  . Self administers medications as prescribed . Attends all scheduled provider appointments . Calls pharmacy for medication refills . Calls provider office for new concerns or questions . Unable to independently Self check CBG's  Initial goal documentation     . "I would like to try to drnk more water to help my kidneys" (pt-stated)       CARE PLAN ENTRY (see longtitudinal plan of care for additional care plan information)  Current Barriers:  Marland Kitchen Knowledge Deficits related to disease process and Self Health management of CKD . Lacks caregiver support.  . Chronic Disease Management support and education needs related to CKD stage 3, Abnormal glucose, Pulmonary hypertension, Coronary Atherosclerosis   Nurse Case Manager Clinical Goal(s):  Marland Kitchen Over the next 90 days, patient will work with the [place] and PCP to address needs related to disease education and support to improve Self Health management of CKD   CCM RN CM Interventions:  05/13/19 call completed with patient  . Evaluation of current treatment plan related to CKD and patient's adherence to plan as established by provider. . Provided education to patient re: stages of CKD; reviewed  and discussed patient's current GFR 49 and how to improve kidney function by lowering his A1c, increasing exercise activity, drinking plenty of water, maintaining a healthy weight . Discussed plans with patient for ongoing  care management follow up and provided patient with direct contact information for care management team . Provided patient with printed educational materials related to Stages of Chronic Kidney disease; 6 Ways to be Water Wise  Patient Self Care Activities:  . Self administers medications as prescribed . Attends all scheduled provider appointments . Calls pharmacy for medication refills . Calls provider office for new concerns or questions . Unable to independently check blood glucose levels at home due to poor dexterity in his hands  Initial goal documentation       Other   . COMPLETED: Assist with Chronic Care Management and Care Coordination needs       CARE PLAN ENTRY (see longtitudinal plan of care for additional care plan information)   Current Barriers:  . Chronic Disease Management support, education, and care coordination needs related to CKD Stage III and Pulmonary Hypertension, Coronary Atherosclerosis  Case Manager Clinical Goal(s):  Marland Kitchen Over the next 90 days, patient will work with the CCM team to address needs related to chronic care management and care coordination needs in patient with CKD Stage III and Pulmonary Hypertension, Coronary Atherosclerosis  Interventions:  . Collaborated with BSW to initiate plan of care to address needs related to Lacks knowledge of community resource:    in patient with CKD Stage III and Pulmonary Hypertension and Coronary Athersclerosis  Patient Self Care Activities:  . Patient verbalizes understanding of plan to work with care management team to assist with care coordination . Self administers medications as prescribed . Attends all scheduled provider appointments . Calls provider office for new concerns or questions  Initial goal documentation       Patient verbalizes understanding of instructions provided today.   The care management team will reach out to the patient again over the next 21-30 days.  Follow up with provider  re: Nephrology appointment   Barb Merino, RN, BSN, CCM Care Management Coordinator [facility]  Direct Phone: [phone]

## 2019-05-17 NOTE — Chronic Care Management (AMB) (Signed)
Chronic Care Management   Initial Visit Note  05/13/2019 Name: Gary [name] MRN: [id number] DOB: 1932/12/26  Referred by: Minette Brine, FNP Reason for referral : Chronic Care Management (RQ Initial RN Call - DM, HTN, CKD stage IV)   Gary [name] is a 86 y.o. year old male who is a primary care patient of Minette Brine, [place]. The CCM team was consulted for assistance with chronic disease management and care coordination needs related to CKD Stage 3, Abnormal Glucose, Pulmonary Hypertension, Coronary Atherosclerosis.   Review of patient status, including review of consultants reports, relevant laboratory and other test results, and collaboration with appropriate care team members and the patient's provider was performed as part of comprehensive patient evaluation and provision of chronic care management services.    SDOH (Social Determinants of Health) assessments performed: Yes See Care Plan activities for detailed interventions related to [place] initial CCM RN CM outbound call to patient to assess for CCM needs, a care plan was established.     Medications: Outpatient Encounter Medications as of 05/12/2019  Medication Sig  . acetaminophen (TYLENOL) 500 MG tablet Take 1,000 mg by mouth every 8 (eight) hours as needed for fever.  Marland Kitchen aspirin EC 81 MG tablet Take 81 mg by mouth daily.  . blood glucose meter kit and supplies KIT Dispense based on patient and insurance preference. Use up to four times daily as directed. (FOR ICD-9 250.00, 250.01).  . carvedilol (COREG) 3.125 MG tablet Take 1 tablet (3.125 mg total) by mouth 2 (two) times daily with a meal.  . citalopram (CELEXA) 40 MG tablet Take 40 mg by mouth daily.  . Cyanocobalamin (VITAMIN B-12 IJ) Inject 1 Dose as directed once a week.  . doxazosin (CARDURA) 4 MG tablet Take 4 mg by mouth daily.   . fluticasone (FLONASE) 50 MCG/ACT nasal spray Place 2 sprays into both nostrils daily.   . furosemide (LASIX) 20 MG tablet Take 1  tablet (20 mg total) by mouth every other day.  . gabapentin (NEURONTIN) 100 MG capsule Take 4 capsules (400 mg total) by mouth 3 (three) times daily.  . nitroGLYCERIN (NITROSTAT) 0.4 MG SL tablet PLACE 1 TABLET UNDER THE TONGUE EVERY 5 MINUTES AS NEEDED FOR CHEST PAIN FOR 3 DOSES (Patient taking differently: Place 0.4 mg under the tongue every 5 (five) minutes as needed for chest pain. )  . simvastatin (ZOCOR) 40 MG tablet TAKE 1 TABLET BY MOUTH AT BEDTIME (Patient taking differently: Take 40 mg by mouth at bedtime. )  . Tamsulosin HCl (FLOMAX) 0.4 MG CAPS Take 0.4 mg by mouth daily after supper.  . traMADol-acetaminophen (ULTRACET) 37.5-325 MG per tablet Take 2 tablets by mouth 3 (three) times daily.   . vitamin B-12 1000 MCG tablet Take 1 tablet (1,000 mcg total) by mouth daily.   No facility-administered encounter medications on file as of 05/12/2019.     Objective:  Lab Results  Component Value Date   HGBA1C 7.4 (H) 03/28/2019   HGBA1C (H) 08/09/2009    6.2 (NOTE)                                                                       According to the [place]  Recommendations for 2011, when HbA1c is used as a screening test:   >=6.5%   Diagnostic of Diabetes Mellitus           (if abnormal result  is confirmed)  5.7-6.4%   Increased risk of developing Diabetes Mellitus  References:Diagnosis and Classification of Diabetes Mellitus,Diabetes TIWP,8099,83(JASNK 1):S62-S69 and Standards of Medical Care in         Diabetes - 2011,Diabetes NLZJ,6734,19  (Suppl 1):S11-S61.   Lab Results  Component Value Date   LDLCALC (H) 08/09/2009    105        Total Cholesterol/HDL:CHD Risk Coronary Heart Disease Risk Table                     Men   Women  1/2 Average Risk   3.4   3.3  Average Risk       5.0   4.4  2 X Average Risk   9.6   7.1  3 X Average Risk  23.4   11.0        Use the calculated Patient Ratio above and the CHD Risk Table to determine the patient's CHD Risk.          ATP III CLASSIFICATION (LDL):  <100     mg/dL   Optimal  100-129  mg/dL   Near or Above                    Optimal  130-159  mg/dL   Borderline  160-189  mg/dL   High  >190     mg/dL   Very High   CREATININE 1.63 (H) 04/22/2019   BP Readings from Last 3 Encounters:  05/13/19 (!) 148/82  04/22/19 132/88  04/20/19 119/64    Goals Addressed      Patient Stated   . "I need someone to help me check my blood sugars" (pt-stated)       CARE PLAN ENTRY (see longtitudinal plan of care for additional care plan information)  Current Barriers:  Marland Kitchen Knowledge Deficits related to disease process and Self Health management of Abnormal glucose . Lacks caregiver support.  . Chronic Disease Management support and education needs related to CKD stage 3, Abnormal glucose, Pulmonary Hypertension, coronary Atherosclerosis  Nurse Case Manager Clinical Goal(s):  Marland Kitchen Over the next 90 days, patient will work with the [place] and PCP to address needs related to disease education and support to improve Self management of DM and lower A1c <7.0  CCM RN CM Interventions:  05/13/19 call completed with patient and wife  . Evaluation of current treatment plan related to Abnormal glucose and patient's adherence to plan as established by provider . Provided education to patient re: current A1c of 7.4; Educated on goal A1c <7.0 and how to achieve and maintain this goal with medication adherence, ADA diet adherence, exercise with 150 minutes per week recommended; achieving/maintaining healthy weight; Educated on daily glycemic goal of FBS 80-130 and <180 after meals; 15"15" rule . Determined patient is unable to Self check his CBG's at home due to having poor dexterity in his hands; his wife has severe tremors  . Sent in basket message to embedded BSW Daneen Schick requesting any available resources such as the [facility] and [facility] that may be a good option for patient to receive assistance with monitoring  CBG's . Determined patient's daughter whom lives with the patient is unable to help check his CBG's; patient will consider asking a neighbor  who is a nurse to help out with checking CBG's  . Reviewed medications with patient and discussed indication, dosage and frequency of prescribed medications; Determined patient is not currently prescribed antidiabetic medications . Discussed plans with patient for ongoing care management follow up and provided patient with direct contact information for care management team . Provided patient with printed educational materials related to Diabetes Management using the Plate Method; Diabetes Zone Safety Tool; signs/symptoms of Hyper/Hypoglycemia; Carb Choices . Reviewed scheduled/upcoming provider appointments including: PCP follow up with Minette Brine FNP scheduled for 06/03/19 _0 :30 PM   Patient Self Care Activities:  . Self administers medications as prescribed . Attends all scheduled provider appointments . Calls pharmacy for medication refills . Calls provider office for new concerns or questions . Unable to independently Self check CBG's  Initial goal documentation     . "I would like to try to drnk more water to help my kidneys" (pt-stated)       CARE PLAN ENTRY (see longtitudinal plan of care for additional care plan information)  Current Barriers:  Marland Kitchen Knowledge Deficits related to disease process and Self Health management of CKD . Lacks caregiver support.  . Chronic Disease Management support and education needs related to CKD stage 3, Abnormal glucose, Pulmonary hypertension, Coronary Atherosclerosis   Nurse Case Manager Clinical Goal(s):  Marland Kitchen Over the next 90 days, patient will work with the [place] and PCP to address needs related to disease education and support to improve Self Health management of CKD   CCM RN CM Interventions:  05/13/19 call completed with patient  . Evaluation of current treatment plan related to CKD and patient's  adherence to plan as established by provider. . Provided education to patient re: stages of CKD; reviewed and discussed patient's current GFR 49 and how to improve kidney function by lowering his A1c, increasing exercise activity, drinking plenty of water, maintaining a healthy weight . Discussed plans with patient for ongoing care management follow up and provided patient with direct contact information for care management team . Provided patient with printed educational materials related to Stages of Chronic Kidney disease; 6 Ways to be Water Wise  Patient Self Care Activities:  . Self administers medications as prescribed . Attends all scheduled provider appointments . Calls pharmacy for medication refills . Calls provider office for new concerns or questions . Unable to independently check blood glucose levels at home due to poor dexterity in his hands  Initial goal documentation       Other   . COMPLETED: Assist with Chronic Care Management and Care Coordination needs       CARE PLAN ENTRY (see longtitudinal plan of care for additional care plan information)   Current Barriers:  . Chronic Disease Management support, education, and care coordination needs related to CKD Stage III and Pulmonary Hypertension, Coronary Atherosclerosis  Case Manager Clinical Goal(s):  Marland Kitchen Over the next 90 days, patient will work with the CCM team to address needs related to chronic care management and care coordination needs in patient with CKD Stage III and Pulmonary Hypertension, Coronary Atherosclerosis  Interventions:  . Collaborated with BSW to initiate plan of care to address needs related to Lacks knowledge of community resource:    in patient with CKD Stage III and Pulmonary Hypertension and Coronary Athersclerosis  Patient Self Care Activities:  . Patient verbalizes understanding of plan to work with care management team to assist with care coordination . Self administers medications as  prescribed . Attends  all scheduled provider appointments . Calls provider office for new concerns or questions  Initial goal documentation        Plan:   The care management team will reach out to the patient again over the next 21-30 days.  Follow up with provider re: Nephrology appointment  Barb Merino, RN, BSN, CCM Care Management Coordinator [place] [facility]  Direct Phone: [phone]

## 2019-05-17 NOTE — Chronic Care Management (AMB) (Signed)
  Chronic Care Management   Outreach Note  05/17/2019 Name: Gary [name] MRN: [id number] DOB: [birth year]  Referred by: Minette Brine, FNP Reason for referral : Care Coordination   SW placed an unsuccessful outbound call to the patient to assist with care coordination needs. SW left a HIPAA compliant voice message requesting a return call.  Follow Up Plan: The care management team will reach out to the patient again over the next 10 days.   Daneen Schick, BSW, CDP Social Worker, Certified Dementia Practitioner [place] [facility] [place] [facility] [phone]

## 2019-05-18 ENCOUNTER — Other Ambulatory Visit: Payer: Self-pay

## 2019-05-18 ENCOUNTER — Ambulatory Visit: Payer: Medicare PPO | Admitting: Dermatology

## 2019-05-18 ENCOUNTER — Telehealth: Payer: Self-pay

## 2019-05-18 ENCOUNTER — Encounter: Payer: Self-pay | Admitting: Dermatology

## 2019-05-18 ENCOUNTER — Ambulatory Visit (INDEPENDENT_AMBULATORY_CARE_PROVIDER_SITE_OTHER): Payer: Medicare PPO

## 2019-05-18 DIAGNOSIS — D485 Neoplasm of uncertain behavior of skin: Secondary | ICD-10-CM

## 2019-05-18 DIAGNOSIS — C44622 Squamous cell carcinoma of skin of right upper limb, including shoulder: Secondary | ICD-10-CM

## 2019-05-18 DIAGNOSIS — N1832 Chronic kidney disease, stage 3b: Secondary | ICD-10-CM

## 2019-05-18 DIAGNOSIS — I2510 Atherosclerotic heart disease of native coronary artery without angina pectoris: Secondary | ICD-10-CM

## 2019-05-18 DIAGNOSIS — D099 Carcinoma in situ, unspecified: Secondary | ICD-10-CM

## 2019-05-18 DIAGNOSIS — I272 Pulmonary hypertension, unspecified: Secondary | ICD-10-CM

## 2019-05-18 DIAGNOSIS — D492 Neoplasm of unspecified behavior of bone, soft tissue, and skin: Secondary | ICD-10-CM

## 2019-05-18 DIAGNOSIS — R7309 Other abnormal glucose: Secondary | ICD-10-CM

## 2019-05-18 DIAGNOSIS — C4492 Squamous cell carcinoma of skin, unspecified: Secondary | ICD-10-CM

## 2019-05-18 DIAGNOSIS — N183 Chronic kidney disease, stage 3 unspecified: Secondary | ICD-10-CM

## 2019-05-18 NOTE — Patient Instructions (Signed)

## 2019-05-19 ENCOUNTER — Ambulatory Visit: Payer: Self-pay | Admitting: *Deleted

## 2019-05-19 ENCOUNTER — Encounter: Payer: Self-pay | Admitting: Nurse Practitioner

## 2019-05-19 NOTE — Chronic Care Management (AMB) (Signed)
  Chronic Care Management   Note  05/19/2019 Name: Gary [name] MRN: [id number] DOB: 08-19-1932  [place] referral made for assistance with transitioning medications to pill packs.   Follow up plan: [facility].  [place] Management Coordinator Direct Dial:  [phone]  Fax: [phone]

## 2019-05-20 ENCOUNTER — Telehealth: Payer: Self-pay

## 2019-05-20 ENCOUNTER — Other Ambulatory Visit: Payer: Self-pay | Admitting: Pharmacist

## 2019-05-20 NOTE — Chronic Care Management (AMB) (Signed)
Chronic Care Management   Follow Up Note   05/20/2019 Name: Gary [name] MRN: [id number] DOB: Mar 03, 1932  Referred by: Minette Brine, FNP Reason for referral : Chronic Care Management (FU RN Call to provider)   Gary [name] is a 86 y.o. year old male who is a primary care patient of Minette Brine, [place]. The CCM team was consulted for assistance with chronic disease management and care coordination needs.    Review of patient status, including review of consultants reports, relevant laboratory and other test results, and collaboration with appropriate care team members and the patient's provider was performed as part of comprehensive patient evaluation and provision of chronic care management services.    SDOH (Social Determinants of Health) assessments performed: No See Care Plan activities for detailed interventions  Placed outbound call to [facility] to f/u on recent Nephrology referral for evaluation and treatment of stage IV CKD. Marland Kitchen     Outpatient Encounter Medications as of 05/18/2019  Medication Sig  . acetaminophen (TYLENOL) 500 MG tablet Take 1,000 mg by mouth every 8 (eight) hours as needed for fever.  Marland Kitchen aspirin EC 81 MG tablet Take 81 mg by mouth daily.  . blood glucose meter kit and supplies KIT Dispense based on patient and insurance preference. Use up to four times daily as directed. (FOR ICD-9 250.00, 250.01).  . carvedilol (COREG) 3.125 MG tablet Take 1 tablet (3.125 mg total) by mouth 2 (two) times daily with a meal.  . citalopram (CELEXA) 40 MG tablet Take 40 mg by mouth daily.  . Cyanocobalamin (VITAMIN B-12 IJ) Inject 1 Dose as directed once a week.  . doxazosin (CARDURA) 4 MG tablet Take 4 mg by mouth daily.   . fluticasone (FLONASE) 50 MCG/ACT nasal spray Place 2 sprays into both nostrils daily.   . furosemide (LASIX) 20 MG tablet Take 1 tablet (20 mg total) by mouth every other day.  . gabapentin (NEURONTIN) 100 MG capsule Take 4 capsules (400 mg total) by  mouth 3 (three) times daily.  . nitroGLYCERIN (NITROSTAT) 0.4 MG SL tablet PLACE 1 TABLET UNDER THE TONGUE EVERY 5 MINUTES AS NEEDED FOR CHEST PAIN FOR 3 DOSES (Patient taking differently: Place 0.4 mg under the tongue every 5 (five) minutes as needed for chest pain. )  . simvastatin (ZOCOR) 40 MG tablet TAKE 1 TABLET BY MOUTH AT BEDTIME (Patient taking differently: Take 40 mg by mouth at bedtime. )  . Tamsulosin HCl (FLOMAX) 0.4 MG CAPS Take 0.4 mg by mouth daily after supper.  . traMADol-acetaminophen (ULTRACET) 37.5-325 MG per tablet Take 2 tablets by mouth 3 (three) times daily.   . vitamin B-12 1000 MCG tablet Take 1 tablet (1,000 mcg total) by mouth daily.   No facility-administered encounter medications on file as of 05/18/2019.     Objective:  Lab Results  Component Value Date   HGBA1C 7.4 (H) 03/28/2019   HGBA1C (H) 08/09/2009    6.2 (NOTE)                                                                       According to the ADA Clinical Practice Recommendations for 2011, when HbA1c is used as a screening test:   >=6.5%   Diagnostic  of Diabetes Mellitus           (if abnormal result  is confirmed)  5.7-6.4%   Increased risk of developing Diabetes Mellitus  References:Diagnosis and Classification of Diabetes Mellitus,Diabetes DDUK,0254,27(CWCBJ 1):S62-S69 and Standards of Medical Care in         Diabetes - 2011,Diabetes SEGB,1517,61  (Suppl 1):S11-S61.   Lab Results  Component Value Date   LDLCALC (H) 08/09/2009    105        Total Cholesterol/HDL:CHD Risk Coronary Heart Disease Risk Table                     Men   Women  1/2 Average Risk   3.4   3.3  Average Risk       5.0   4.4  2 X Average Risk   9.6   7.1  3 X Average Risk  23.4   11.0        Use the calculated Patient Ratio above and the CHD Risk Table to determine the patient's CHD Risk.        ATP III CLASSIFICATION (LDL):  <100     mg/dL   Optimal  100-129  mg/dL   Near or Above                    Optimal   130-159  mg/dL   Borderline  160-189  mg/dL   High  >190     mg/dL   Very High   CREATININE 1.63 (H) 04/22/2019   BP Readings from Last 3 Encounters:  05/13/19 (!) 148/82  04/22/19 132/88  04/20/19 119/64    Goals Addressed      Patient Stated   . "I would like to try to drnk more water to help my kidneys" (pt-stated)       CARE PLAN ENTRY (see longtitudinal plan of care for additional care plan information)  Current Barriers:  Marland Kitchen Knowledge Deficits related to disease process and Self Health management of CKD . Lacks caregiver support.  . Chronic Disease Management support and education needs related to CKD stage 3, Abnormal glucose, Pulmonary hypertension, Coronary Atherosclerosis   Nurse Case Manager Clinical Goal(s):  Marland Kitchen Over the next 90 days, patient will work with the [place] and PCP to address needs related to disease education and support to improve Self Health management of CKD   CCM RN CM Interventions:  05/13/19 call completed with patient  . Evaluation of current treatment plan related to CKD and patient's adherence to plan as established by provider. . Provided education to patient re: stages of CKD; reviewed and discussed patient's current GFR 49 and how to improve kidney function by lowering his A1c, increasing exercise activity, drinking plenty of water, maintaining a healthy weight . Discussed plans with patient for ongoing care management follow up and provided patient with direct contact information for care management team . Provided patient with printed educational materials related to Stages of Chronic Kidney disease; 6 Ways to be Water Wise  05/20/19 Placed outbound call to [facility] to follow up on recent referral sent on 04/15/19 for dx: stage IV CKD . Spoke with representative who confirmed the referral was received and the patient will receive a call in the near future to schedule his new patient appointment  . Determined the patient is running behind  with scheduling new patient appointments and based on Mr. Arscott current renal function he will most likely be scheduled for June  appointment . Discussed Mrs. Shearer was notified of this information during a recent call from her  Patient Self Care Activities:  . Self administers medications as prescribed . Attends all scheduled provider appointments . Calls pharmacy for medication refills . Calls provider office for new concerns or questions . Unable to independently check blood glucose levels at home due to poor dexterity in his hands  Please see past updates related to this goal by clicking on the "Past Updates" button in the selected goal         Plan:   Telephone follow up appointment with care management team member scheduled for: 06/09/19   Barb Merino, RN, BSN, CCM Care Management Coordinator [place] [facility]  Direct Phone: [phone]

## 2019-05-20 NOTE — Telephone Encounter (Signed)
Left message for patient to call for results of pathology and to schedule 76min surgery with Dr. Denna Haggard for spots #1 and #4.

## 2019-05-20 NOTE — Telephone Encounter (Signed)
-----   Message from Lavonna Monarch, MD sent at 05/20/2019  6:22 AM EDT ----- #1 + #4 schedule 17min surg with DrT; decide on #2 at that time.

## 2019-05-20 NOTE — Patient Instructions (Signed)
.  ccmp

## 2019-05-20 NOTE — Patient Outreach (Signed)
[place] [facility]) Care Management  05/20/2019  Gary [name] 20-Jul-1932 [id number]  Called the patient regarding pill packing.  Spoke with patient's wife, Gary [name].  HIPAA identifiers were obtained.    Mrs. Cundari said she was interested in having help with getting the patient set up with pill packing.  Since moving over to [place] Team, we no longer walk patient's through this process.  We function more as a resource.    Mrs. Lieske was given the names of local pharmacies in her area that provide pill packing services:  [facility], [facility], and [facility].  She was also given information on [facility].  Since the [facility] a patron [facility], she decided to go with [facility].  Mrs. Bonsell said she was at an appointment with the patient and would call me back this afternoon  Elayne Guerin, PharmD, [place] Clinical Pharmacist [phone]

## 2019-05-22 ENCOUNTER — Other Ambulatory Visit: Payer: Self-pay | Admitting: Pharmacist

## 2019-05-23 NOTE — Progress Notes (Addendum)
   Follow-Up Visit   Subjective  Gary [name] is a 86 y.o. male who presents for the following: Skin Problem (Possible recurrent NMSC on left outer hand which is very sore.  Couple places across his face that have flaking. Right temple does have a scab like place. Right middle and ring finer have sore places.).  Growths Location: Hands and right cheek Duration: Several months Quality: Larger Associated Signs/Symptoms: Hands sore Modifying Factors:  Severity:  Timing: Context: History of multiple skin cancers  The following portions of the chart were reviewed this encounter and updated as appropriate:     Objective  Well appearing patient in no apparent distress; mood and affect are within normal limits.  All sun exposed areas plus back examined.   Assessment & Plan  Neoplasm of skin Right Buccal Cheek   Skin / nail biopsy Type of biopsy: tangential   Informed consent: discussed and consent obtained   Instrument used: flexible razor blade   Hemostasis achieved with: ferric subsulfate   Outcome: patient tolerated procedure well   Post-procedure details: sterile dressing applied and wound care instructions given   Dressing type: bandage and petrolatum    Specimen 3 - Surgical pathology Differential Diagnosis: BCC vs SCC Check Margins: No  Squamous cell carcinoma of skin (3) Right 4th Finger Metacarpophalangeal Joint  Skin excision  Destruction of lesion  Destruction method: electrodesiccation and curettage   Anesthesia: the lesion was anesthetized in a standard fashion   Anesthetic:  1% lidocaine w/ epinephrine 1-100,000 local infiltration Lesion length (cm):  1.2 Lesion width (cm):  1 Margin per side (cm):  0 Final wound size (cm):  1.2  Left 5th Finger Metacarpophalangeal Joint  Destruction of lesion  Destruction method: electrodesiccation and curettage   Timeout:  patient name, date of birth, surgical site, and procedure verified Procedure prep:  Patient  was prepped and draped in usual sterile fashion Prep type:  Chlorhexidine Anesthesia: the lesion was anesthetized in a standard fashion   Anesthetic:  1% lidocaine w/ epinephrine 1-100,000 local infiltration Curettage performed in three different directions: Yes   Electrodesiccation performed over the curetted area: Yes   Curettage cycles:  3 Lesion length (cm):  1.3 Lesion width (cm):  1 Margin per side (cm):  0 Final wound size (cm):  1.3 Hemostasis achieved with:  ferric subsulfate Outcome: patient tolerated procedure well with no complications   Post-procedure details: wound care instructions given    Left 2nd Finger Metacarpophalangeal Joint  Skin excision  Destruction of lesion  Destruction method: electrodesiccation and curettage   Informed consent: discussed and consent obtained   Anesthesia: the lesion was anesthetized in a standard fashion   Anesthetic:  1% lidocaine w/ epinephrine 1-100,000 local infiltration Curettage performed in three different directions: Yes   Electrodesiccation performed over the curetted area: Yes   Curettage cycles:  3 Lesion length (cm):  1.2 Lesion width (cm):  1 Margin per side (cm):  0 Final wound size (cm):  1.2 Hemostasis achieved with:  ferric subsulfate Outcome: patient tolerated procedure well with no complications   Post-procedure details: wound care instructions given

## 2019-05-24 ENCOUNTER — Telehealth: Payer: Self-pay

## 2019-05-24 NOTE — Telephone Encounter (Signed)
Path results to patient and 30 minute with Tressie Ellis made for Thursday, April 29th at 1:30pm.

## 2019-05-24 NOTE — Telephone Encounter (Signed)
-----   Message from Lavonna Monarch, MD sent at 05/20/2019  6:22 AM EDT ----- #1 + #4 schedule 69min surg with DrT; decide on #2 at that time.

## 2019-05-25 ENCOUNTER — Other Ambulatory Visit: Payer: Self-pay | Admitting: Pharmacist

## 2019-05-25 ENCOUNTER — Encounter: Payer: Self-pay | Admitting: Nurse Practitioner

## 2019-05-25 ENCOUNTER — Ambulatory Visit (INDEPENDENT_AMBULATORY_CARE_PROVIDER_SITE_OTHER): Payer: Medicare PPO | Admitting: *Deleted

## 2019-05-25 ENCOUNTER — Ambulatory Visit: Payer: Self-pay | Admitting: Pharmacist

## 2019-05-25 ENCOUNTER — Other Ambulatory Visit: Payer: Self-pay

## 2019-05-25 DIAGNOSIS — L089 Local infection of the skin and subcutaneous tissue, unspecified: Secondary | ICD-10-CM

## 2019-05-25 MED ORDER — MUPIROCIN 2 % EX OINT: 1.0000 "application " | TOPICAL_OINTMENT | Freq: Two times a day (BID) | CUTANEOUS | 1 refills | Status: DC

## 2019-05-25 NOTE — Patient Outreach (Signed)
[place] [facility]) Care Management  05/25/2019  Gary [name] 03-27-1932 [id number]   Called patient to follow up on medication adherence packaging.  Unfortunately, the patient no his wife answered the phone. A male answered and said the patient was at a dermatology appointment.  HIPAA compliant message was left.  Plan: Call patient back in 3-5 business days.  Elayne Guerin, PharmD, [place] Clinical Pharmacist [phone]

## 2019-05-25 NOTE — Progress Notes (Signed)
Patient her for nts wound check- left hand sore and tender- per dr. Denna Haggard bacterial culture done and patient to use mupirocin ointment two times per day- sent to patient pharmacy.

## 2019-05-26 ENCOUNTER — Ambulatory Visit: Payer: Self-pay

## 2019-05-26 DIAGNOSIS — I272 Pulmonary hypertension, unspecified: Secondary | ICD-10-CM

## 2019-05-26 DIAGNOSIS — N1832 Chronic kidney disease, stage 3b: Secondary | ICD-10-CM

## 2019-05-26 DIAGNOSIS — I2510 Atherosclerotic heart disease of native coronary artery without angina pectoris: Secondary | ICD-10-CM

## 2019-05-26 LAB — TIQ-NTM

## 2019-05-26 NOTE — Patient Instructions (Signed)
Social Worker Visit Information  Goals we discussed today:  Goals Addressed            This Visit's Progress     Patient Stated   . "I need someone to help me check my blood sugars" (pt-stated)   On track    [place] (see longtitudinal plan of care for additional care plan information)  Current Barriers:  Marland Kitchen Knowledge Deficits related to disease process and Self Health management of Abnormal glucose . Lacks caregiver support.  . Chronic Disease Management support and education needs related to CKD stage 3, Abnormal glucose, Pulmonary Hypertension, coronary Atherosclerosis  Nurse Case Manager Clinical Goal(s):  Marland Kitchen Over the next 90 days, patient will work with the Jack and PCP to address needs related to disease education and support to improve Self management of DM and lower A1c <7.0  CCM SW Interventions: Completed 05/26/19 with souse Ann . Successful outbound call to the patients home to assess goal progression . Confirmed the patient has been successful with daily blood glucose monitoring with the help of his wife, Lelon Frohlich, whom reports goal of checking BID o Determined the patient morning readings trend higher than evening readings  o Morning readings are reported to range between 120-180 o Evening readings are reported to range between 120-140 o Determined the patient often awakes through the night to eat a snack. Ann reports the patients snack of choice to be peanut butter crackers . Discussed desire for Lelon Frohlich to obtain a safety lancet that is activated by pressing against the skin as a one time use versus current lancets which require loading a needle due to her own tremor o Delton See SW would collaborate with the patients primary care provider to request orders for safety lancets o Lelon Frohlich reports she is awaiting an order from [facility] and she feels she has ordered the appropriate lancets o Discussed plans for Ann to contact the patients provider should alternative lancets be  needed . Advised Ann to bring blood sugar monitoring log to appointment on 4/15 in order to allow the patients provider to review . Collaboration with Consulting civil engineer, Barb Merino and the patients primary provider, Minette Brine, FNP to communicate trends in blood sugar monitoring  CCM RN CM Interventions:  05/13/19 call completed with patient and wife  . Evaluation of current treatment plan related to Abnormal glucose and patient's adherence to plan as established by provider . Provided education to patient re: current A1c of 7.4; Educated on goal A1c <7.0 and how to achieve and maintain this goal with medication adherence, ADA diet adherence, exercise with 150 minutes per week recommended; achieving/maintaining healthy weight; Educated on daily glycemic goal of FBS 80-130 and <180 after meals; 15"15" rule . Determined patient is unable to Self check his CBG's at home due to having poor dexterity in his hands; his wife has severe tremors  . Sent in basket message to embedded BSW Daneen Schick requesting any available resources such as the Area on Aging and PACE that may be a good option for patient to receive assistance with monitoring CBG's . Determined patient's daughter whom lives with the patient is unable to help check his CBG's; patient will consider asking a neighbor who is a nurse to help out with checking CBG's  . Reviewed medications with patient and discussed indication, dosage and frequency of prescribed medications; Determined patient is not currently prescribed antidiabetic medications . Discussed plans with patient for ongoing care management follow up and provided  patient with direct contact information for care management team . Provided patient with printed educational materials related to Diabetes Management using the Plate Method; Diabetes Zone Safety Tool; signs/symptoms of Hyper/Hypoglycemia; Carb Choices . Reviewed scheduled/upcoming provider appointments including: PCP follow  up with Minette Brine FNP scheduled for 06/03/19 '@2'$ :30 PM   Patient Self Care Activities:  . Self administers medications as prescribed . Attends all scheduled provider appointments . Calls pharmacy for medication refills . Calls provider office for new concerns or questions . Unable to independently Self check CBG's  Please see past updates related to this goal by clicking on the "Past Updates" button in the selected goal        Other   . COMPLETED: Collaborate with RN Case Manager to assist with care coordination needs       CARE PLAN ENTRY (see longtitudinal plan of care for additional care plan information)  Current Barriers:  . ADL IADL limitations due to pulmonary hypertension and coronary atherosclerosis . Family and relationship dysfunction  Social Work Clinical Goal(s):  Marland Kitchen Over the next 90 days, patient will work with care management team to address needs related to care coordination  CCM SW Interventions: Completed 05/26/2019 with patient and his wife Lelon Frohlich . Outbound call placed to the patients spouse, Lelon Frohlich to assist with care coordination needs . Confirmed the patient is active with home health and engaged with RN Care Manager to address disease specific chronic condition needs . Goal Met  Patient Self Care Activities:  . Patient verbalizes understanding of plan to work with care management team to assist with care coordination . Self administers medications as prescribed . Attends all scheduled provider appointments . Calls provider office for new concerns or questions  Please see past updates related to this goal by clicking on the "Past Updates" button in the selected goal          Follow Up Plan: No SW follow up planned at this time. Please contact me with future resource needs.   Daneen Schick, BSW, CDP Social Worker, Certified Dementia Practitioner [place] [facility] [place] [facility] [phone]

## 2019-05-26 NOTE — Chronic Care Management (AMB) (Signed)
Chronic Care Management    Social Work Follow Up Note  05/26/2019 Name: Gary [name] MRN: [id number] DOB: 1932-05-04  Gary [name] is a 86 y.o. year old male who is a primary care patient of Minette Brine, [place]. The CCM team was consulted for assistance with care coordination.   Review of patient status, including review of consultants reports, other relevant assessments, and collaboration with appropriate care team members and the patient's provider was performed as part of comprehensive patient evaluation and provision of chronic care management services.    SDOH (Social Determinants of Health) assessments performed: No    Outpatient Encounter Medications as of 05/26/2019  Medication Sig  . acetaminophen (TYLENOL) 500 MG tablet Take 1,000 mg by mouth every 8 (eight) hours as needed for fever.  Marland Kitchen amLODipine (NORVASC) 5 MG tablet Take 1 tablet (5 mg total) by mouth daily.  Marland Kitchen aspirin EC 81 MG tablet Take 81 mg by mouth daily.  . blood glucose meter kit and supplies KIT Dispense based on patient and insurance preference. Use up to four times daily as directed. (FOR ICD-9 250.00, 250.01).  . carvedilol (COREG) 3.125 MG tablet Take 1 tablet (3.125 mg total) by mouth 2 (two) times daily with a meal.  . citalopram (CELEXA) 40 MG tablet Take 40 mg by mouth daily.  . Cyanocobalamin (VITAMIN B-12 IJ) Inject 1 Dose as directed once a week.  . doxazosin (CARDURA) 4 MG tablet Take 4 mg by mouth daily.   . fluticasone (FLONASE) 50 MCG/ACT nasal spray Place 2 sprays into both nostrils daily.   . furosemide (LASIX) 20 MG tablet Take 1 tablet (20 mg total) by mouth every other day.  . gabapentin (NEURONTIN) 100 MG capsule Take 4 capsules (400 mg total) by mouth 3 (three) times daily.  . mupirocin ointment (BACTROBAN) 2 % Apply 1 application topically 2 (two) times daily.  . nitroGLYCERIN (NITROSTAT) 0.4 MG SL tablet PLACE 1 TABLET UNDER THE TONGUE EVERY 5 MINUTES AS NEEDED FOR CHEST PAIN FOR 3 DOSES  (Patient taking differently: Place 0.4 mg under the tongue every 5 (five) minutes as needed for chest pain. )  . simvastatin (ZOCOR) 40 MG tablet TAKE 1 TABLET BY MOUTH AT BEDTIME (Patient taking differently: Take 40 mg by mouth at bedtime. )  . Tamsulosin HCl (FLOMAX) 0.4 MG CAPS Take 0.4 mg by mouth daily after supper.  . traMADol-acetaminophen (ULTRACET) 37.5-325 MG per tablet Take 2 tablets by mouth 3 (three) times daily.   . vitamin B-12 1000 MCG tablet Take 1 tablet (1,000 mcg total) by mouth daily.   No facility-administered encounter medications on file as of 05/26/2019.     Goals Addressed            This Visit's Progress     Patient Stated   . "I need someone to help me check my blood sugars" (pt-stated)   On track    [place] (see longtitudinal plan of care for additional care plan information)  Current Barriers:  Marland Kitchen Knowledge Deficits related to disease process and Self Health management of Abnormal glucose . Lacks caregiver support.  . Chronic Disease Management support and education needs related to CKD stage 3, Abnormal glucose, Pulmonary Hypertension, coronary Atherosclerosis  Nurse Case Manager Clinical Goal(s):  Marland Kitchen Over the next 90 days, patient will work with the [place] and PCP to address needs related to disease education and support to improve Self management of DM and lower A1c <7.0  CCM SW  Interventions: Completed 05/26/19 with souse Ann . Successful outbound call to the patients home to assess goal progression . Confirmed the patient has been successful with daily blood glucose monitoring with the help of his wife, Lelon Frohlich, whom reports goal of checking BID o Determined the patient morning readings trend higher than evening readings  o Morning readings are reported to range between 120-180 o Evening readings are reported to range between 120-140 o Determined the patient often awakes through the night to eat a snack. Ann reports the patients snack of choice  to be peanut butter crackers . Discussed desire for Lelon Frohlich to obtain a safety lancet that is activated by pressing against the skin as a one time use versus current lancets which require loading a needle due to her own tremor o Delton See SW would collaborate with the patients primary care provider to request orders for safety lancets o Lelon Frohlich reports she is awaiting an order from [facility] and she feels she has ordered the appropriate lancets o Discussed plans for Ann to contact the patients provider should alternative lancets be needed . Advised Ann to bring blood sugar monitoring log to appointment on 4/15 in order to allow the patients provider to review . Collaboration with Consulting civil engineer, Barb Merino and the patients primary provider, Minette Brine, FNP to communicate trends in blood sugar monitoring  CCM RN CM Interventions:  05/13/19 call completed with patient and wife  . Evaluation of current treatment plan related to Abnormal glucose and patient's adherence to plan as established by provider . Provided education to patient re: current A1c of 7.4; Educated on goal A1c <7.0 and how to achieve and maintain this goal with medication adherence, ADA diet adherence, exercise with 150 minutes per week recommended; achieving/maintaining healthy weight; Educated on daily glycemic goal of FBS 80-130 and <180 after meals; 15"15" rule . Determined patient is unable to Self check his CBG's at home due to having poor dexterity in his hands; his wife has severe tremors  . Sent in basket message to embedded BSW Daneen Schick requesting any available resources such as the Area on Aging and PACE that may be a good option for patient to receive assistance with monitoring CBG's . Determined patient's daughter whom lives with the patient is unable to help check his CBG's; patient will consider asking a neighbor who is a nurse to help out with checking CBG's  . Reviewed medications with patient and discussed  indication, dosage and frequency of prescribed medications; Determined patient is not currently prescribed antidiabetic medications . Discussed plans with patient for ongoing care management follow up and provided patient with direct contact information for care management team . Provided patient with printed educational materials related to Diabetes Management using the Plate Method; Diabetes Zone Safety Tool; signs/symptoms of Hyper/Hypoglycemia; Carb Choices . Reviewed scheduled/upcoming provider appointments including: PCP follow up with Minette Brine FNP scheduled for 06/03/19 '@2'$ :30 PM   Patient Self Care Activities:  . Self administers medications as prescribed . Attends all scheduled provider appointments . Calls pharmacy for medication refills . Calls provider office for new concerns or questions . Unable to independently Self check CBG's  Please see past updates related to this goal by clicking on the "Past Updates" button in the selected goal        Other   . COMPLETED: Collaborate with RN Case Manager to assist with care coordination needs       CARE PLAN ENTRY (see longtitudinal plan of care for additional  care plan information)  Current Barriers:  . ADL IADL limitations due to pulmonary hypertension and coronary atherosclerosis . Family and relationship dysfunction  Social Work Clinical Goal(s):  Marland Kitchen Over the next 90 days, patient will work with care management team to address needs related to care coordination  CCM SW Interventions: Completed 05/26/2019 with patient and his wife Lelon Frohlich . Outbound call placed to the patients spouse, Lelon Frohlich to assist with care coordination needs . Confirmed the patient is active with home health and engaged with RN Care Manager to address disease specific chronic condition needs . Goal Met  Patient Self Care Activities:  . Patient verbalizes understanding of plan to work with care management team to assist with care coordination . Self  administers medications as prescribed . Attends all scheduled provider appointments . Calls provider office for new concerns or questions  Please see past updates related to this goal by clicking on the "Past Updates" button in the selected goal          Follow Up Plan: No SW follow up planned at this time. The patient will remain active with RN Care Manager.  Daneen Schick, BSW, CDP Social Worker, Certified Dementia Practitioner [place] [facility] [place] [facility] [phone]  Total time spent performing care coordination and/or care management activities with the patient by phone or face to face = 12 minutes.

## 2019-05-27 ENCOUNTER — Ambulatory Visit: Payer: Medicare PPO | Admitting: Nurse Practitioner

## 2019-05-27 ENCOUNTER — Encounter: Payer: Self-pay | Admitting: Nurse Practitioner

## 2019-05-27 ENCOUNTER — Ambulatory Visit: Payer: Medicare PPO

## 2019-05-28 ENCOUNTER — Other Ambulatory Visit: Payer: Self-pay | Admitting: Pharmacist

## 2019-05-28 ENCOUNTER — Telehealth: Payer: Self-pay | Admitting: Dermatology

## 2019-05-28 NOTE — Patient Outreach (Addendum)
[facility]) Care Management  05/28/2019  Gary [name] 03-02-1932 [id number]   Spoke with Patient's wife, Mardene Celeste about pill packing.  HIPAA identifiers were obtained.    Patient is an 86 year old male with multiple medical conditions including but not limited to:  CAD, CKD stage III, DJD, hyperlipidemia, A Fib, Pulmonary  Hypertension, urinary retention, and vitamin B 12 deficiency.  Our service line no longer completely walks patient's through the process.    [facility] was called with the patient's wife on conference call.  She was able to get the patient set-up with [facility].  His first delivery is set to arrive at their home on 07/06/2019.  Patient's wife communicated understanding that she needs to reach out to [facility] for any questions or concerns with the patient's medications.    Patient's Providers need to send all new prescriptions [facility] located in [place], [place]. ([facility] was added to the patient's clinical demographics.)   Elayne Guerin, PharmD, [place] Clinical Pharmacist [phone]

## 2019-05-28 NOTE — Telephone Encounter (Signed)
Results +   Place on left hand near little finger still hurts, but somewhat better; other places are OK and starting to scab over.  (Forward message to ST after giving him results please)

## 2019-05-30 ENCOUNTER — Encounter: Payer: Self-pay | Admitting: Nurse Practitioner

## 2019-05-31 LAB — ANAEROBIC AND AEROBIC CULTURE
MICRO NUMBER:: 10357177
MICRO NUMBER:: 10357178
SPECIMEN QUALITY:: ADEQUATE
SPECIMEN QUALITY:: ADEQUATE

## 2019-06-01 NOTE — Telephone Encounter (Signed)
Make sure this message from pt populates chart. Dr Darene Lamer

## 2019-06-03 ENCOUNTER — Telehealth: Payer: Self-pay

## 2019-06-03 ENCOUNTER — Ambulatory Visit: Payer: Self-pay

## 2019-06-03 ENCOUNTER — Ambulatory Visit: Payer: Self-pay | Admitting: Nurse Practitioner

## 2019-06-03 ENCOUNTER — Ambulatory Visit: Payer: Medicare PPO

## 2019-06-03 ENCOUNTER — Other Ambulatory Visit: Payer: Self-pay

## 2019-06-03 DIAGNOSIS — I2510 Atherosclerotic heart disease of native coronary artery without angina pectoris: Secondary | ICD-10-CM | POA: Diagnosis not present

## 2019-06-03 DIAGNOSIS — R7309 Other abnormal glucose: Secondary | ICD-10-CM

## 2019-06-03 DIAGNOSIS — N1832 Chronic kidney disease, stage 3b: Secondary | ICD-10-CM

## 2019-06-03 DIAGNOSIS — I272 Pulmonary hypertension, unspecified: Secondary | ICD-10-CM

## 2019-06-03 NOTE — Chronic Care Management (AMB) (Signed)
Chronic Care Management   Follow Up Note   06/03/2019 Name: Gary [name] MRN: [id number] DOB: 1932/08/25  Referred by: Minette Brine, FNP Reason for referral : Chronic Care Management (CCM RN/Pharm D Case Collaboration )   Gary [name] is a 86 y.o. year old male who is a primary care patient of Minette Brine, [place]. The CCM team was consulted for assistance with chronic disease management and care coordination needs.    Review of patient status, including review of consultants reports, relevant laboratory and other test results, and collaboration with appropriate care team members and the patient's provider was performed as part of comprehensive patient evaluation and provision of chronic care management services.    Received patient update from Pharm D regarding transition to pill package system.     Outpatient Encounter Medications as of 06/03/2019  Medication Sig  . acetaminophen (TYLENOL) 500 MG tablet Take 1,000 mg by mouth every 8 (eight) hours as needed for fever.  Marland Kitchen amLODipine (NORVASC) 5 MG tablet Take 1 tablet (5 mg total) by mouth daily.  Marland Kitchen aspirin EC 81 MG tablet Take 81 mg by mouth daily.  . blood glucose meter kit and supplies KIT Dispense based on patient and insurance preference. Use up to four times daily as directed. (FOR ICD-9 250.00, 250.01).  . carvedilol (COREG) 3.125 MG tablet Take 1 tablet (3.125 mg total) by mouth 2 (two) times daily with a meal.  . citalopram (CELEXA) 40 MG tablet Take 40 mg by mouth daily.  . Cyanocobalamin (VITAMIN B-12 IJ) Inject 1 Dose as directed once a week.  . doxazosin (CARDURA) 4 MG tablet Take 4 mg by mouth daily.   . fluticasone (FLONASE) 50 MCG/ACT nasal spray Place 2 sprays into both nostrils daily.   . furosemide (LASIX) 20 MG tablet Take 1 tablet (20 mg total) by mouth every other day.  . gabapentin (NEURONTIN) 100 MG capsule Take 4 capsules (400 mg total) by mouth 3 (three) times daily.  . mupirocin ointment (BACTROBAN) 2 %  Apply 1 application topically 2 (two) times daily.  . nitroGLYCERIN (NITROSTAT) 0.4 MG SL tablet PLACE 1 TABLET UNDER THE TONGUE EVERY 5 MINUTES AS NEEDED FOR CHEST PAIN FOR 3 DOSES (Patient taking differently: Place 0.4 mg under the tongue every 5 (five) minutes as needed for chest pain. )  . simvastatin (ZOCOR) 40 MG tablet TAKE 1 TABLET BY MOUTH AT BEDTIME (Patient taking differently: Take 40 mg by mouth at bedtime. )  . Tamsulosin HCl (FLOMAX) 0.4 MG CAPS Take 0.4 mg by mouth daily after supper.  . traMADol-acetaminophen (ULTRACET) 37.5-325 MG per tablet Take 2 tablets by mouth 3 (three) times daily.   . vitamin B-12 1000 MCG tablet Take 1 tablet (1,000 mcg total) by mouth daily. (Patient not taking: Reported on 05/28/2019)   No facility-administered encounter medications on file as of 06/03/2019.     Objective:  Lab Results  Component Value Date   HGBA1C 7.4 (H) 03/28/2019   HGBA1C (H) 08/09/2009    6.2 (NOTE)                                                                       According to the ADA Clinical Practice Recommendations for 2011, when  HbA1c is used as a screening test:   >=6.5%   Diagnostic of Diabetes Mellitus           (if abnormal result  is confirmed)  5.7-6.4%   Increased risk of developing Diabetes Mellitus  References:Diagnosis and Classification of Diabetes Mellitus,Diabetes [id number],3748,27(MBEML 1):S62-S69 and Standards of Medical Care in         Diabetes - 2011,Diabetes JQGB,2010,07  (Suppl 1):S11-S61.   Lab Results  Component Value Date   LDLCALC (H) 08/09/2009    105        Total Cholesterol/HDL:CHD Risk Coronary Heart Disease Risk Table                     Men   Women  1/2 Average Risk   3.4   3.3  Average Risk       5.0   4.4  2 X Average Risk   9.6   7.1  3 X Average Risk  23.4   11.0        Use the calculated Patient Ratio above and the CHD Risk Table to determine the patient's CHD Risk.        ATP III CLASSIFICATION (LDL):  <100     mg/dL   Optimal   100-129  mg/dL   Near or Above                    Optimal  130-159  mg/dL   Borderline  160-189  mg/dL   High  >190     mg/dL   Very High   CREATININE 1.63 (H) 04/22/2019   BP Readings from Last 3 Encounters:  05/13/19 (!) 148/82  04/22/19 132/88  04/20/19 119/64    Goals Addressed    . COMPLETED: Pharmacy referral to assist with medication managment       CARE PLAN ENTRY (see longitudinal plan of care for additional care plan information)  Current Barriers:  Marland Kitchen Knowledge Deficits related to transition Polypharmacy to pill package system . Chronic Disease Management support and education needs related to CKD stage 3, Pulmonary Hypertension, Coronary Atherosclerosis, Abnormal glucose  Nurse Case Manager Clinical Goal(s):  Marland Kitchen Over the next 30 days, patient will work with embedded Pharm D to address needs related to medication management by transitioning to pill package system for Polypharmacy needs  Interventions:  . Inter-disciplinary care team collaboration (see longitudinal plan of care) . Pharmacy referral for assistance with best options for medication management via pill pack system for Polypharmacy needs . Sent in basket message to PCP provider with an update regarding where to send all future chronic prescriptions to [facility] in [place], [place] . Received update from [place], Pharm D, BCACP o 05/28/19 Patient's wife was able to get him set up with [facility]. His first pill packs are set to arrive in 07/06/2019. All chronic prescriptions should be sent to [facility] in [place], [place].   Patient Self Care Activities:  . Self administers medications as prescribed . Attends all scheduled provider appointments . Calls pharmacy for medication refills . Calls provider office for new concerns or questions  Initial goal documentation       Plan:   Telephone follow up appointment with care management team  member scheduled for: 06/09/19  Barb Merino, RN, BSN, CCM Care Management Coordinator [place] [facility]  Direct Phone: [phone]

## 2019-06-07 ENCOUNTER — Ambulatory Visit: Payer: Medicare PPO | Admitting: Nurse Practitioner

## 2019-06-07 ENCOUNTER — Other Ambulatory Visit: Payer: Self-pay

## 2019-06-07 ENCOUNTER — Encounter: Payer: Self-pay | Admitting: Nurse Practitioner

## 2019-06-07 VITALS — BP 132/72 | HR 88 | Temp 98.3°F | Ht 63.6 in | Wt 206.0 lb

## 2019-06-07 DIAGNOSIS — R7309 Other abnormal glucose: Secondary | ICD-10-CM

## 2019-06-07 DIAGNOSIS — E538 Deficiency of other specified B group vitamins: Secondary | ICD-10-CM

## 2019-06-07 DIAGNOSIS — N1832 Chronic kidney disease, stage 3b: Secondary | ICD-10-CM | POA: Diagnosis not present

## 2019-06-07 DIAGNOSIS — L600 Ingrowing nail: Secondary | ICD-10-CM | POA: Diagnosis not present

## 2019-06-07 MED ORDER — CYANOCOBALAMIN 1000 MCG/ML IJ SOLN
1000.0000 ug | Freq: Once | INTRAMUSCULAR | Status: AC
  Administered 2019-06-07: 1000 ug via INTRAMUSCULAR

## 2019-06-07 NOTE — Progress Notes (Signed)
This visit occurred during the SARS-CoV-2 public health emergency.  Safety protocols were in place, including screening questions prior to the visit, additional usage of staff PPE, and extensive cleaning of exam room while observing appropriate contact time as indicated for disinfecting solutions.  Subjective:     Patient ID: Gary [name] , male    DOB: [birth year] , 86 y.o.   MRN: [id number]   Chief Complaint  Patient presents with  . B12 Injection  . blood sugar recheck    HPI  Here for Vitamin B12 follow up.  He is feeling tired and lethargic.   He will start his physical therapy in the upcoming weeks.  Wt Readings from Last 3 Encounters: 06/07/19 : 206 lb (93.4 kg) 05/13/19 : 208 lb (94.3 kg) 04/22/19 : 208 lb (94.3 kg)  He is working with the cardiologist and is getting a new mask for his CPAP, likes to use the nasal prongs. He has seen the Dermatologist and had some areas removed from both anterior surfaces  Diabetes He presents for his follow-up diabetic visit. No MedicAlert identification noted. There are no hypoglycemic associated symptoms. Pertinent negatives for hypoglycemia include no confusion, dizziness or headaches. Associated symptoms include fatigue. Pertinent negatives for diabetes include no chest pain. There are no hypoglycemic complications. There are no diabetic complications. Risk factors for coronary artery disease include sedentary lifestyle, obesity and male sex. Current diabetic treatment includes oral agent (monotherapy).     Past Medical History:  Diagnosis Date  . Adenomatous colon polyp    2010   . Anemia   . Anxiety   . Arthritis    "hips and lower back"  . Arthritis pain   . Atypical mole 06/21/2003   left neck, inferior - slight to moderate atypia  . Blood transfusion   . Chest pain 03/29/11   2D Echo without contrast on 03/29/11 - EF= 55-60%.  . Cholecystitis   . Chronic back pain greater than 3 months duration   . Chronic kidney disease    . Colon polyps   . Complication of anesthesia    once slow to wake up  . Coronary angioplasty status 2011  . Coronary artery disease   . Diarrhea   . Diverticulosis   . Dyslipidemia   . GERD (gastroesophageal reflux disease)   . GI bleeding after 07/2009   "triggered by Plavix & ASA; from my diverticulosis"  . H/O Clostridium difficile infection   . H/O myocardial perfusion scan 04/04/11   For Pre-noncardiac surgery - EF = 67%, no ischemia, and is considered low risk.  Marland Kitchen Heart attack ([facility]) 07/2009   nonSTEMI with an occluded circumflex artery and collaterals.  Marland Kitchen Heart murmur   . Hemorrhoids   . History of shingles   . Hyperlipidemia   . Hypertension   . Kidney stones   . Lower extremity edema    Taking furosemide and it seems to be helping.  . Neuromuscular disorder ([place])   . Nonspecific chest pain 08/26/2012   Went to ED 08/26/12  . Squamous cell carcinoma of skin 05/13/2000   Right outer forehead  . Squamous cell carcinoma of skin 03/02/2001   Post crown - tx 03/19/2001  . Squamous cell carcinoma of skin 09/01/2001   Left post auricular - tx 10/09/2001  . Squamous cell carcinoma of skin 06/21/2003   Top right ear - tx 07/05/2003  . Squamous cell carcinoma of skin 06/21/2003   Right upper arm - tx 07/05/2003  . Squamous  cell carcinoma of skin 10/12/2003   Left superior ear lobe - MOHs  . Squamous cell carcinoma of skin 12/15/2006   Right ear, superior - tx 01/26/2007  . Squamous cell carcinoma of skin 03/13/2009   Right temple - tx 05/08/2009  . Squamous cell carcinoma of skin 03/24/2013   Upper left forearm - tx p bx  . Squamous cell carcinoma of skin 12/06/2013   Right forearm - CX3 + 5FU  . Squamous cell carcinoma of skin 02/10/2014   Right hand - tx p bx  . Squamous cell carcinoma of skin 10/04/2014   Right shin - tx p bx  . Squamous cell carcinoma of skin 08/21/2016   Left hand - tx p bx  . Squamous cell carcinoma of skin 01/01/2017   Right hand - tx  02/06/2017  . Squamous cell carcinoma of skin 10/21/2017   Left side of scalp - tx 03/12/2018  . Squamous cell carcinoma of skin 10/21/2017   Left post scalp, inf - tx 08/13/2018  . Squamous cell carcinoma of skin 10/21/2017   Right scalp - tx p bx  . Squamous cell carcinoma of skin 10/21/2017   Left cheek - tx 03/12/2018  . Squamous cell carcinoma of skin 08/13/2018   Left top hand, proximal pinky - tx p bx  . Squamous cell carcinoma of skin 12/23/2018   Right bicep lower - tx p bx  . Squamous cell carcinoma of skin 05/18/2019   Right 4th finger metacarpophalangeal joint  . Squamous cell carcinoma of skin 05/18/2019   Left 5th finger metacarpophalangeal joint  . Squamous cell carcinoma of skin 05/18/2019   Left 2nd finger metacarpophalangeal joint  . Stomach problems   . Syncope and collapse 04/16/2018     Family History  Problem Relation Age of Onset  . Heart disease Father   . Stroke Father   . Pancreatic cancer Mother   . Diverticulosis Sister        x4  . Colon cancer Neg Hx      Current Outpatient Medications:  .  acetaminophen (TYLENOL) 500 MG tablet, Take 1,000 mg by mouth every 8 (eight) hours as needed for fever., Disp: , Rfl:  .  aspirin EC 81 MG tablet, Take 81 mg by mouth daily., Disp: , Rfl:  .  blood glucose meter kit and supplies KIT, Dispense based on patient and insurance preference. Use up to four times daily as directed. (FOR ICD-9 250.00, 250.01)., Disp: 1 each, Rfl: 0 .  carvedilol (COREG) 3.125 MG tablet, Take 1 tablet (3.125 mg total) by mouth 2 (two) times daily with a meal., Disp: 180 tablet, Rfl: 3 .  citalopram (CELEXA) 40 MG tablet, Take 40 mg by mouth daily., Disp: , Rfl:  .  Cyanocobalamin (VITAMIN B-12 IJ), Inject 1 Dose as directed once a week., Disp: , Rfl:  .  doxazosin (CARDURA) 4 MG tablet, Take 4 mg by mouth daily. , Disp: , Rfl:  .  fluticasone (FLONASE) 50 MCG/ACT nasal spray, Place 2 sprays into both nostrils daily. , Disp: , Rfl:  .   furosemide (LASIX) 20 MG tablet, Take 1 tablet (20 mg total) by mouth every other day., Disp: 45 tablet, Rfl: 1 .  gabapentin (NEURONTIN) 100 MG capsule, Take 4 capsules (400 mg total) by mouth 3 (three) times daily., Disp: 30 capsule, Rfl: 0 .  mupirocin ointment (BACTROBAN) 2 %, Apply 1 application topically 2 (two) times daily., Disp: 22 g, Rfl: 1 .  nitroGLYCERIN (NITROSTAT)  0.4 MG SL tablet, PLACE 1 TABLET UNDER THE TONGUE EVERY 5 MINUTES AS NEEDED FOR CHEST PAIN FOR 3 DOSES (Patient taking differently: Place 0.4 mg under the tongue every 5 (five) minutes as needed for chest pain. ), Disp: 25 tablet, Rfl: 0 .  simvastatin (ZOCOR) 40 MG tablet, TAKE 1 TABLET BY MOUTH AT BEDTIME (Patient taking differently: Take 40 mg by mouth at bedtime. ), Disp: 90 tablet, Rfl: 2 .  Tamsulosin HCl (FLOMAX) 0.4 MG CAPS, Take 0.4 mg by mouth daily after supper., Disp: , Rfl:  .  traMADol-acetaminophen (ULTRACET) 37.5-325 MG per tablet, Take 2 tablets by mouth 3 (three) times daily. , Disp: , Rfl:  .  vitamin B-12 1000 MCG tablet, Take 1 tablet (1,000 mcg total) by mouth daily., Disp: 3 tablet, Rfl:  .  amLODipine (NORVASC) 5 MG tablet, Take 1 tablet (5 mg total) by mouth daily., Disp: 90 tablet, Rfl: 3   Allergies  Allergen Reactions  . Naproxen Rash  . Clopidogrel Bisulfate Other (See Comments)    H/O diverticulitis; prone to rectal bleeding.  Plavix complicated this.  jkl  . Sulfonamide Derivatives Hives  . Other Other (See Comments)    Narcotics-C. Diff  . Latex Rash  . Tape Itching and Rash    Latex-Adhesive tape     Review of Systems  Constitutional: Positive for fatigue.  Respiratory: Negative.  Negative for cough.   Cardiovascular: Negative.  Negative for chest pain, palpitations and leg swelling.  Neurological: Negative for dizziness and headaches.  Psychiatric/Behavioral: Negative for agitation and confusion.     Today's Vitals   06/07/19 1448  BP: 132/72  Pulse: 88  Temp: 98.3 F  (36.8 C)  TempSrc: Oral  Weight: 206 lb (93.4 kg)  Height: 5' 3.6" (1.615 m)  PainSc: 0-No pain   Body mass index is 35.81 kg/m.   Objective:  Physical Exam Vitals reviewed.  Constitutional:      General: He is not in acute distress.    Appearance: Normal appearance. He is obese.  Cardiovascular:     Rate and Rhythm: Normal rate and regular rhythm.     Pulses: Normal pulses.     Heart sounds: Normal heart sounds. No murmur.  Pulmonary:     Effort: Pulmonary effort is normal. No respiratory distress.     Breath sounds: Normal breath sounds. No wheezing.  Musculoskeletal:        General: No swelling.  Skin:    General: Skin is warm and dry.     Capillary Refill: Capillary refill takes less than 2 seconds.  Neurological:     General: No focal deficit present.     Mental Status: He is alert and oriented to person, place, and time.  Psychiatric:        Mood and Affect: Mood normal.        Behavior: Behavior normal.        Thought Content: Thought content normal.        Judgment: Judgment normal.         Assessment And Plan:   1. Vitamin B12 deficiency  Chronic, vitamin B12 given today - cyanocobalamin ((VITAMIN B-12)) injection 1,000 mcg  2. Abnormal glucose  Chronic, his blood sugars are improving   I will recheck his HgbA1c at his next visit  3. Stage 3b chronic kidney disease  He is currently on the list to be seen at the Nephrologist  4. Ingrown toenail of left foot  Right great toe tenderness near  the cuticle, minimal redness   I will refer him to the podiatrist for further evaluation.    Minette Brine, FNP    THE PATIENT IS ENCOURAGED TO PRACTICE SOCIAL DISTANCING DUE TO THE COVID-19 PANDEMIC.

## 2019-06-09 ENCOUNTER — Telehealth: Payer: Self-pay

## 2019-06-09 ENCOUNTER — Other Ambulatory Visit: Payer: Self-pay

## 2019-06-09 ENCOUNTER — Ambulatory Visit: Payer: Self-pay

## 2019-06-09 DIAGNOSIS — I2510 Atherosclerotic heart disease of native coronary artery without angina pectoris: Secondary | ICD-10-CM

## 2019-06-09 DIAGNOSIS — I272 Pulmonary hypertension, unspecified: Secondary | ICD-10-CM

## 2019-06-09 DIAGNOSIS — R7309 Other abnormal glucose: Secondary | ICD-10-CM

## 2019-06-09 DIAGNOSIS — N1832 Chronic kidney disease, stage 3b: Secondary | ICD-10-CM

## 2019-06-10 ENCOUNTER — Ambulatory Visit (INDEPENDENT_AMBULATORY_CARE_PROVIDER_SITE_OTHER): Payer: Medicare PPO | Admitting: Dermatology

## 2019-06-10 ENCOUNTER — Other Ambulatory Visit: Payer: Self-pay

## 2019-06-10 ENCOUNTER — Encounter: Payer: Self-pay | Admitting: Dermatology

## 2019-06-10 DIAGNOSIS — D0461 Carcinoma in situ of skin of right upper limb, including shoulder: Secondary | ICD-10-CM

## 2019-06-10 DIAGNOSIS — C44629 Squamous cell carcinoma of skin of left upper limb, including shoulder: Secondary | ICD-10-CM | POA: Diagnosis not present

## 2019-06-10 DIAGNOSIS — D099 Carcinoma in situ, unspecified: Secondary | ICD-10-CM

## 2019-06-10 MED ORDER — IMIQUIMOD 5 % EX CREA: TOPICAL_CREAM | Freq: Every evening | CUTANEOUS | 0 refills | Status: DC

## 2019-06-10 NOTE — Progress Notes (Addendum)
   Follow-Up Visit   Subjective  Gary [name] is a 86 y.o. male who presents for the following: Skin Cancer (right 4th finger metacarpophalangeal joint,left 2nd finger metacarpophalangeal joint- scc ).  Skin cancers Location: Hands Duration:  Quality: Area still crusty Associated Signs/Symptoms: Modifying Factors:  Severity:  Timing: Context:   The following portions of the chart were reviewed this encounter and updated as appropriate:     Objective  Well appearing patient in no apparent distress; mood and affect are within normal limits.  Exam of head, neck, hands.   Assessment & Plan  Squamous cell carcinoma in situ (3) Right Hand - Posterior  Destruction of lesion Complexity: simple   Destruction method: cryotherapy   Informed consent: discussed and consent obtained   Timeout:  patient name, date of birth, surgical site, and procedure verified Lesion destroyed using liquid nitrogen: Yes   Region frozen until ice ball extended beyond lesion: Yes   Cryotherapy cycles:  5 Lesion length (cm):  1.3 Lesion width (cm):  1 Margin per side (cm):  0 Final wound size (cm):  1.3 Outcome: patient tolerated procedure well with no complications    Right Hand - Posterior  Destruction of lesion Complexity: simple   Destruction method: cryotherapy   Informed consent: discussed and consent obtained   Timeout:  patient name, date of birth, surgical site, and procedure verified Lesion destroyed using liquid nitrogen: Yes   Region frozen until ice ball extended beyond lesion: Yes   Cryotherapy cycles:  5 Lesion length (cm):  1 Lesion width (cm):  1 Margin per side (cm):  0 Final wound size (cm):  1 Outcome: patient tolerated procedure well with no complications    Left Hand - Posterior  Destruction of lesion  Destruction method: cryotherapy   Informed consent: discussed and consent obtained   Timeout:  patient name, date of birth, surgical site, and procedure  verified Lesion destroyed using liquid nitrogen: Yes   Region frozen until ice ball extended beyond lesion: Yes   Cryotherapy cycles:  5 Lesion length (cm):  1.3 Lesion width (cm):  1 Margin per side (cm):  0 Final wound size (cm):  1.3 Outcome: patient tolerated procedure well with no complications    Each spot treated with 15sec LN2 freeze; this will be followed by 20 applications 3x/week imiquimod. 15 sec freeze followed by imiquimod.

## 2019-06-10 NOTE — Chronic Care Management (AMB) (Signed)
  Chronic Care Management   Outreach Note  06/10/2019 Name: Gary [name] MRN: [id number] DOB: Apr 11, 1932  Referred by: Minette Brine, FNP Reason for referral : Chronic Care Management ([facility] RN Call )   An unsuccessful telephone outreach was attempted today. The patient was referred to the case management team for assistance with care management and care coordination. Spoke to patient's daughter who states her father is having skin cancer removed today. No PHI disclosed.   Follow Up Plan: Telephone follow up appointment with care management team member scheduled for: 06/29/19  Barb Merino, RN, BSN, CCM Care Management Coordinator [facility]  Direct Phone: [phone]

## 2019-06-15 LAB — BACTERIA ID,AEROBIC

## 2019-06-17 ENCOUNTER — Ambulatory Visit: Payer: Medicare PPO | Admitting: Podiatry

## 2019-06-22 ENCOUNTER — Telehealth: Payer: Self-pay | Admitting: Dermatology

## 2019-06-22 ENCOUNTER — Other Ambulatory Visit: Payer: Self-pay | Admitting: Dermatology

## 2019-06-22 NOTE — Telephone Encounter (Signed)
Patient is calling for advice on medication- Imiquimod 5%. Patients states the directions said one dose, patient put one dose on each affected area. Patient is now out of medication. Patient states a refill request has been sent to [place] by his pharmacy-patient could not remember the name of the pharmacy. Patient is asking how to proceed from here. Chart [phone]

## 2019-06-23 ENCOUNTER — Other Ambulatory Visit: Payer: Self-pay | Admitting: *Deleted

## 2019-06-23 MED ORDER — IMIQUIMOD 5 % EX CREA: TOPICAL_CREAM | Freq: Every evening | CUTANEOUS | 1 refills | Status: DC

## 2019-06-23 NOTE — Telephone Encounter (Signed)
Instructions were in my note of last visit: imiquimod 3 applications per week to maximum total of 20. Need review with him how to poke pinhole in medication to make it last.

## 2019-06-23 NOTE — Telephone Encounter (Signed)
Left message for patient to call back  

## 2019-06-28 NOTE — Addendum Note (Signed)
Addended by: Ashok Cordia B on: 06/28/2019 02:09 PM   Modules accepted: Orders

## 2019-06-29 ENCOUNTER — Telehealth: Payer: Self-pay

## 2019-06-30 ENCOUNTER — Other Ambulatory Visit: Payer: Self-pay

## 2019-06-30 MED ORDER — MUPIROCIN 2 % EX OINT: 1.0000 "application " | TOPICAL_OINTMENT | Freq: Two times a day (BID) | CUTANEOUS | 3 refills | Status: DC

## 2019-07-01 ENCOUNTER — Encounter: Payer: Self-pay | Admitting: Nurse Practitioner

## 2019-07-02 ENCOUNTER — Ambulatory Visit (INDEPENDENT_AMBULATORY_CARE_PROVIDER_SITE_OTHER): Payer: Medicare PPO | Admitting: *Deleted

## 2019-07-02 DIAGNOSIS — I442 Atrioventricular block, complete: Secondary | ICD-10-CM

## 2019-07-02 LAB — CUP PACEART REMOTE DEVICE CHECK
Battery Remaining Longevity: 151 mo
Battery Voltage: 3.04 V
Brady Statistic AP VP Percent: 35.52 %
Brady Statistic AP VS Percent: 1.1 %
Brady Statistic AS VP Percent: 51.27 %
Brady Statistic AS VS Percent: 12.11 %
Brady Statistic RA Percent Paced: 36.62 %
Brady Statistic RV Percent Paced: 86.79 %
Date Time Interrogation Session: 20210520200751
Implantable Lead Implant Date: 20200306
Implantable Lead Implant Date: 20200306
Implantable Lead Location: 753859
Implantable Lead Location: 753860
Implantable Lead Model: 5076
Implantable Lead Model: 5076
Implantable Pulse Generator Implant Date: 20200306
Lead Channel Impedance Value: 323 Ohm
Lead Channel Impedance Value: 361 Ohm
Lead Channel Impedance Value: 361 Ohm
Lead Channel Impedance Value: 532 Ohm
Lead Channel Pacing Threshold Amplitude: 0.375 V
Lead Channel Pacing Threshold Amplitude: 0.625 V
Lead Channel Pacing Threshold Pulse Width: 0.4 ms
Lead Channel Pacing Threshold Pulse Width: 0.4 ms
Lead Channel Sensing Intrinsic Amplitude: 1 mV
Lead Channel Sensing Intrinsic Amplitude: 1 mV
Lead Channel Sensing Intrinsic Amplitude: 3.875 mV
Lead Channel Sensing Intrinsic Amplitude: 3.875 mV
Lead Channel Setting Pacing Amplitude: 1.5 V
Lead Channel Setting Pacing Amplitude: 2.5 V
Lead Channel Setting Pacing Pulse Width: 0.4 ms
Lead Channel Setting Sensing Sensitivity: 0.6 mV

## 2019-07-05 NOTE — Progress Notes (Signed)
Remote pacemaker transmission.   

## 2019-07-07 ENCOUNTER — Telehealth: Payer: Self-pay | Admitting: Nurse Practitioner

## 2019-07-07 ENCOUNTER — Telehealth (INDEPENDENT_AMBULATORY_CARE_PROVIDER_SITE_OTHER): Payer: Medicare PPO | Admitting: Nurse Practitioner

## 2019-07-07 ENCOUNTER — Encounter: Payer: Self-pay | Admitting: Nurse Practitioner

## 2019-07-07 ENCOUNTER — Ambulatory Visit (INDEPENDENT_AMBULATORY_CARE_PROVIDER_SITE_OTHER): Payer: Medicare PPO

## 2019-07-07 VITALS — BP 121/66 | Temp 98.4°F | Wt 204.0 lb

## 2019-07-07 VITALS — BP 121/66 | Temp 98.4°F | Ht 65.0 in | Wt 204.0 lb

## 2019-07-07 DIAGNOSIS — R7309 Other abnormal glucose: Secondary | ICD-10-CM

## 2019-07-07 DIAGNOSIS — G4709 Other insomnia: Secondary | ICD-10-CM

## 2019-07-07 DIAGNOSIS — Z0000 Encounter for general adult medical examination without abnormal findings: Secondary | ICD-10-CM | POA: Diagnosis not present

## 2019-07-07 DIAGNOSIS — I10 Essential (primary) hypertension: Secondary | ICD-10-CM

## 2019-07-07 MED ORDER — TEMAZEPAM 7.5 MG PO CAPS: 7.5000 mg | ORAL_CAPSULE | Freq: Every evening | ORAL | 2 refills | Status: DC | PRN

## 2019-07-07 NOTE — Patient Instructions (Signed)
[name] , Thank you for taking time to come for your Medicare Wellness Visit. I appreciate your ongoing commitment to your health goals. Please review the following plan we discussed and let me know if I can assist you in the future.   Screening recommendations/referrals: Colonoscopy: not required Recommended yearly ophthalmology/optometry visit for glaucoma screening and checkup Recommended yearly dental visit for hygiene and checkup  Vaccinations: Influenza vaccine: 03/2019 Pneumococcal vaccine: 03/2019 Tdap vaccine: 10/2015 Shingles vaccine: discussed    Advanced directives: Please bring a copy of your POA (Power of [place]) and/or Living Will to your next appointment.   Conditions/risks identified: obesity  Next appointment:   Preventive Care 51 Years and Older, Male Preventive care refers to lifestyle choices and visits with your health care provider that can promote health and wellness. What does preventive care include?  A yearly physical exam. This is also called an annual well check.  Dental exams once or twice a year.  Routine eye exams. Ask your health care provider how often you should have your eyes checked.  Personal lifestyle choices, including:  Daily care of your teeth and gums.  Regular physical activity.  Eating a healthy diet.  Avoiding tobacco and drug use.  Limiting alcohol use.  Practicing safe sex.  Taking low doses of aspirin every day.  Taking vitamin and mineral supplements as recommended by your health care provider. What happens during an annual well check? The services and screenings done by your health care provider during your annual well check will depend on your age, overall health, lifestyle risk factors, and family history of disease. Counseling  Your health care provider may ask you questions about your:  Alcohol use.  Tobacco use.  Drug use.  Emotional well-being.  Home and relationship well-being.  Sexual  activity.  Eating habits.  History of falls.  Memory and ability to understand (cognition).  Work and work Statistician. Screening  You may have the following tests or measurements:  Height, weight, and BMI.  Blood pressure.  Lipid and cholesterol levels. These may be checked every 5 years, or more frequently if you are over 10 years old.  Skin check.  Lung cancer screening. You may have this screening every year starting at age 85 if you have a 30-pack-year history of smoking and currently smoke or have quit within the past 15 years.  Fecal occult blood test (FOBT) of the stool. You may have this test every year starting at age 52.  Flexible sigmoidoscopy or colonoscopy. You may have a sigmoidoscopy every 5 years or a colonoscopy every 10 years starting at age 58.  Prostate cancer screening. Recommendations will vary depending on your family history and other risks.  Hepatitis C blood test.  Hepatitis B blood test.  Sexually transmitted disease (STD) testing.  Diabetes screening. This is done by checking your blood sugar (glucose) after you have not eaten for a while (fasting). You may have this done every 1-3 years.  Abdominal aortic aneurysm (AAA) screening. You may need this if you are a current or former smoker.  Osteoporosis. You may be screened starting at age 25 if you are at high risk. Talk with your health care provider about your test results, treatment options, and if necessary, the need for more tests. Vaccines  Your health care provider may recommend certain vaccines, such as:  Influenza vaccine. This is recommended every year.  Tetanus, diphtheria, and acellular pertussis (Tdap, Td) vaccine. You may need a Td booster every 10 years.  Zoster vaccine. You may need this after age 56.  Pneumococcal 13-valent conjugate (PCV13) vaccine. One dose is recommended after age 76.  Pneumococcal polysaccharide (PPSV23) vaccine. One dose is recommended after age  43. Talk to your health care provider about which screenings and vaccines you need and how often you need them. This information is not intended to replace advice given to you by your health care provider. Make sure you discuss any questions you have with your health care provider. Document Released: 02/24/2015 Document Revised: 10/18/2015 Document Reviewed: 11/29/2014 Elsevier Interactive Patient Education  2017 [employer] Prevention in the Home Falls can cause injuries. They can happen to people of all ages. There are many things you can do to make your home safe and to help prevent falls. What can I do on the outside of my home?  Regularly fix the edges of walkways and driveways and fix any cracks.  Remove anything that might make you trip as you walk through a door, such as a raised step or threshold.  Trim any bushes or trees on the path to your home.  Use bright outdoor lighting.  Clear any walking paths of anything that might make someone trip, such as rocks or tools.  Regularly check to see if handrails are loose or broken. Make sure that both sides of any steps have handrails.  Any raised decks and porches should have guardrails on the edges.  Have any leaves, snow, or ice cleared regularly.  Use sand or salt on walking paths during winter.  Clean up any spills in your garage right away. This includes oil or grease spills. What can I do in the bathroom?  Use night lights.  Install grab bars by the toilet and in the tub and shower. Do not use towel bars as grab bars.  Use non-skid mats or decals in the tub or shower.  If you need to sit down in the shower, use a plastic, non-slip stool.  Keep the floor dry. Clean up any water that spills on the floor as soon as it happens.  Remove soap buildup in the tub or shower regularly.  Attach bath mats securely with double-sided non-slip rug tape.  Do not have throw rugs and other things on the floor that can make  you trip. What can I do in the bedroom?  Use night lights.  Make sure that you have a light by your bed that is easy to reach.  Do not use any sheets or blankets that are too big for your bed. They should not hang down onto the floor.  Have a firm chair that has side arms. You can use this for support while you get dressed.  Do not have throw rugs and other things on the floor that can make you trip. What can I do in the kitchen?  Clean up any spills right away.  Avoid walking on wet floors.  Keep items that you use a lot in easy-to-reach places.  If you need to reach something above you, use a strong step stool that has a grab bar.  Keep electrical cords out of the way.  Do not use floor polish or wax that makes floors slippery. If you must use wax, use non-skid floor wax.  Do not have throw rugs and other things on the floor that can make you trip. What can I do with my stairs?  Do not leave any items on the stairs.  Make sure that there are  handrails on both sides of the stairs and use them. Fix handrails that are broken or loose. Make sure that handrails are as long as the stairways.  Check any carpeting to make sure that it is firmly attached to the stairs. Fix any carpet that is loose or worn.  Avoid having throw rugs at the top or bottom of the stairs. If you do have throw rugs, attach them to the floor with carpet tape.  Make sure that you have a light switch at the top of the stairs and the bottom of the stairs. If you do not have them, ask someone to add them for you. What else can I do to help prevent falls?  Wear shoes that:  Do not have high heels.  Have rubber bottoms.  Are comfortable and fit you well.  Are closed at the toe. Do not wear sandals.  If you use a stepladder:  Make sure that it is fully opened. Do not climb a closed stepladder.  Make sure that both sides of the stepladder are locked into place.  Ask someone to hold it for you, if  possible.  Clearly mark and make sure that you can see:  Any grab bars or handrails.  First and last steps.  Where the edge of each step is.  Use tools that help you move around (mobility aids) if they are needed. These include:  Canes.  Walkers.  Scooters.  Crutches.  Turn on the lights when you go into a dark area. Replace any light bulbs as soon as they burn out.  Set up your furniture so you have a clear path. Avoid moving your furniture around.  If any of your floors are uneven, fix them.  If there are any pets around you, be aware of where they are.  Review your medicines with your doctor. Some medicines can make you feel dizzy. This can increase your chance of falling. Ask your doctor what other things that you can do to help prevent falls. This information is not intended to replace advice given to you by your health care provider. Make sure you discuss any questions you have with your health care provider. Document Released: 11/24/2008 Document Revised: 07/06/2015 Document Reviewed: 03/04/2014 Elsevier Interactive Patient Education  2017 [employer].

## 2019-07-07 NOTE — Progress Notes (Signed)
I connected with Gary [name] today by telephone and verified that I am speaking with the correct person using two identifiers. Location patient: home Location provider: work Persons participating in the virtual visit: patient Gary [name], provider Gary Durand LPN.   I discussed the limitations, risks, security and privacy concerns of performing an evaluation and management service by telephone and the availability of in person appointments. I also discussed with the patient that there may be a patient responsible charge related to this service. The patient expressed understanding and verbally consented to this telephonic visit.    Interactive audio and video telecommunications were attempted between this provider and patient, however failed, due to patient having technical difficulties OR patient did not have access to video capability.  We continued and completed visit with audio only.       Subjective:   Gary [name] is a 86 y.o. male who presents for an Initial Medicare Annual Wellness Visit.  Review of Systems  n/a Cardiac Risk Factors include: advanced age (>36mn, >>28women);diabetes mellitus;dyslipidemia;male gender;obesity (BMI >30kg/m2)    Objective:    Today's Vitals   07/07/19 1613  BP: 121/66  Temp: 98.4 F (36.9 C)  Weight: 204 lb (92.5 kg)  Height: '5\' 5"'$  (1.651 m)   Body mass index is 33.95 kg/m.  Advanced Directives 07/07/2019 03/27/2019 04/16/2018 04/16/2018 01/09/2018 07/04/2017 06/01/2015  Does Patient Have a Medical Advance Directive? Yes No Yes Yes Yes Yes Yes  Type of [place] Living will - Living will  Does patient want to make changes to medical advance directive? - - No - Patient declined - - - -  Copy of [place]? - - Yes - validated most recent copy scanned in chart (See row information) - - - -  Would patient like information on creating a  medical advance directive? - No - Patient declined - - - - -  Pre-existing out of facility DNR order (yellow form or pink MOST form) - - - - - - -    Current Medications (verified) Outpatient Encounter Medications as of 07/07/2019  Medication Sig  . acetaminophen (TYLENOL) 500 MG tablet Take 1,000 mg by mouth every 8 (eight) hours as needed for fever.  .Marland KitchenamLODipine (NORVASC) 5 MG tablet Take 1 tablet (5 mg total) by mouth daily.  .Marland Kitchenaspirin EC 81 MG tablet Take 81 mg by mouth daily.  . blood glucose meter kit and supplies KIT Dispense based on patient and insurance preference. Use up to four times daily as directed. (FOR ICD-9 250.00, 250.01).  . carvedilol (COREG) 3.125 MG tablet Take 1 tablet (3.125 mg total) by mouth 2 (two) times daily with a meal.  . citalopram (CELEXA) 40 MG tablet Take 40 mg by mouth daily.  . Cyanocobalamin (VITAMIN B-12 IJ) Inject 1 Dose as directed once a week.  . doxazosin (CARDURA) 4 MG tablet Take 4 mg by mouth daily.   . fluticasone (FLONASE) 50 MCG/ACT nasal spray Place 2 sprays into both nostrils daily.   . furosemide (LASIX) 20 MG tablet Take 1 tablet (20 mg total) by mouth every other day.  . gabapentin (NEURONTIN) 100 MG capsule Take 4 capsules (400 mg total) by mouth 3 (three) times daily.  . imiquimod (ALDARA) 5 % cream Apply topically at bedtime. Apply to affected area Monday, weds & friday  . mupirocin ointment (BACTROBAN) 2 % Apply 1 application topically 2 (two) times  daily. (Patient not taking: Reported on 07/07/2019)  . nitroGLYCERIN (NITROSTAT) 0.4 MG SL tablet PLACE 1 TABLET UNDER THE TONGUE EVERY 5 MINUTES AS NEEDED FOR CHEST PAIN FOR 3 DOSES (Patient taking differently: Place 0.4 mg under the tongue every 5 (five) minutes as needed for chest pain. )  . simvastatin (ZOCOR) 40 MG tablet TAKE 1 TABLET BY MOUTH AT BEDTIME (Patient taking differently: Take 40 mg by mouth at bedtime. )  . Tamsulosin HCl (FLOMAX) 0.4 MG CAPS Take 0.4 mg by mouth daily after  supper.  . temazepam (RESTORIL) 7.5 MG capsule Take 1 capsule (7.5 mg total) by mouth at bedtime as needed for sleep.  . traMADol-acetaminophen (ULTRACET) 37.5-325 MG per tablet Take 2 tablets by mouth 3 (three) times daily.   . vitamin B-12 1000 MCG tablet Take 1 tablet (1,000 mcg total) by mouth daily. (Patient not taking: Reported on 07/07/2019)   No facility-administered encounter medications on file as of 07/07/2019.    Allergies (verified) Naproxen, Clopidogrel bisulfate, Sulfonamide derivatives, Other, Latex, and Tape   History: Past Medical History:  Diagnosis Date  . Adenomatous colon polyp    2010   . Anemia   . Anxiety   . Arthritis    "hips and lower back"  . Arthritis pain   . Atypical mole 06/21/2003   left neck, inferior - slight to moderate atypia  . Basal cell carcinoma 10/04/2014   right shin bcc cx3 45f  . Blood transfusion   . Chest pain 03/29/11   2D Echo without contrast on 03/29/11 - EF= 55-60%.  . Cholecystitis   . Chronic back pain greater than 3 months duration   . Chronic kidney disease   . Colon polyps   . Complication of anesthesia    once slow to wake up  . Coronary angioplasty status 2011  . Coronary artery disease   . Diarrhea   . Diverticulosis   . Dyslipidemia   . GERD (gastroesophageal reflux disease)   . GI bleeding after 07/2009   "triggered by Plavix & ASA; from my diverticulosis"  . H/O Clostridium difficile infection   . H/O myocardial perfusion scan 04/04/11   For Pre-noncardiac surgery - EF = 67%, no ischemia, and is considered low risk.  .Marland KitchenHeart attack (HChloride 07/2009   nonSTEMI with an occluded circumflex artery and collaterals.  .Marland KitchenHeart murmur   . Hemorrhoids   . History of shingles   . Hyperlipidemia   . Hypertension   . Kidney stones   . Lower extremity edema    Taking furosemide and it seems to be helping.  . Neuromuscular disorder ([place]   . Nonspecific chest pain 08/26/2012   Went to ED 08/26/12  . Squamous cell carcinoma  of skin 05/13/2000   Right outer forehead  . Squamous cell carcinoma of skin 03/02/2001   Post crown - tx 03/19/2001  . Squamous cell carcinoma of skin 09/01/2001   Left post auricular - tx 10/09/2001  . Squamous cell carcinoma of skin 06/21/2003   Top right ear - tx 07/05/2003  . Squamous cell carcinoma of skin 06/21/2003   Right upper arm - tx 07/05/2003  . Squamous cell carcinoma of skin 10/12/2003   Left superior ear lobe - MOHs  . Squamous cell carcinoma of skin 12/15/2006   Right ear, superior - tx 01/26/2007  . Squamous cell carcinoma of skin 03/13/2009   Right temple - tx 05/08/2009  . Squamous cell carcinoma of skin 03/24/2013   Upper left  forearm - tx p bx  . Squamous cell carcinoma of skin 12/06/2013   Right forearm - CX3 + 5FU  . Squamous cell carcinoma of skin 02/10/2014   Right hand - tx p bx  . Squamous cell carcinoma of skin 10/04/2014   Right shin - tx p bx  . Squamous cell carcinoma of skin 08/21/2016   Left hand - tx p bx  . Squamous cell carcinoma of skin 01/01/2017   Right hand - tx 02/06/2017  . Squamous cell carcinoma of skin 10/21/2017   Left side of scalp - tx 03/12/2018  . Squamous cell carcinoma of skin 10/21/2017   Left post scalp, inf - tx 08/13/2018  . Squamous cell carcinoma of skin 10/21/2017   Right scalp - tx p bx  . Squamous cell carcinoma of skin 10/21/2017   Left cheek - tx 03/12/2018  . Squamous cell carcinoma of skin 08/13/2018   Left top hand, proximal pinky - tx p bx  . Squamous cell carcinoma of skin 12/23/2018   Right bicep lower - tx p bx  . Squamous cell carcinoma of skin 05/18/2019   Right 4th finger metacarpophalangeal joint  . Squamous cell carcinoma of skin 05/18/2019   Left 5th finger metacarpophalangeal joint  . Squamous cell carcinoma of skin 05/18/2019   Left 2nd finger metacarpophalangeal joint  . Stomach problems   . Syncope and collapse 04/16/2018   Past Surgical History:  Procedure Laterality Date  . BACK  SURGERY  1997  . CARDIAC CATHETERIZATION  08/09/2009   Circumflex 100% occluded with right-to-left collaterals, mild irregularities in the proximal and distal LAD and diagonal system, normal LV systolic function, and minor infrarenal irregularities, but no abdominal aortic aneurysm.  . CHOLECYSTECTOMY  04/12/2011   Procedure: CHOLECYSTECTOMY;  Surgeon: Adin Hector, MD;  Location: [place];  Service: General;  Laterality: N/A;  . CHOLECYSTECTOMY  04/12/2011   Procedure: LAPAROSCOPIC CHOLECYSTECTOMY;  Surgeon: Adin Hector, MD;  Location: [place];  Service: General;  Laterality: N/A;  converted to open  . COLON SURGERY     COLONOSCOPY  . ESOPHAGOGASTRODUODENOSCOPY N/A 08/05/2013   Procedure: ESOPHAGOGASTRODUODENOSCOPY (EGD);  Surgeon: Jerene Bears, MD;  Location: [place];  Service: Gastroenterology;  Laterality: N/A;  . EYE SURGERY  1942   "right eye was crossed; they  corrected it"  . LUMBAR FUSION  07/22/2012  . PACEMAKER IMPLANT N/A 04/17/2018   Procedure: PACEMAKER IMPLANT;  Surgeon: Deboraha Sprang, MD;  Location: [facility];  Service: Cardiovascular;  Laterality: N/A;  . POSTERIOR FUSION LUMBAR SPINE  08/1995   "bottom 4 vertebra"  . SUBTOTAL COLECTOMY  01/24/2010  . TOTAL HIP ARTHROPLASTY Right 08/03/2013   Procedure: TOTAL HIP ARTHROPLASTY ANTERIOR APPROACH;  Surgeon: Hessie Dibble, MD;  Location: [place];  Service: Orthopedics;  Laterality: Right;   Family History  Problem Relation Age of Onset  . Heart disease Father   . Stroke Father   . Pancreatic cancer Mother   . Diverticulosis Sister        x4  . Colon cancer Neg Hx    Social History   Socioeconomic History  . Marital status: Married    Spouse name: Not on file  . Number of children: 2  . Years of education: Not on file  . Highest education level: Not on file  Occupational History  . Occupation: Art therapist  . Occupation: retired  Tobacco Use  . Smoking status: Never Smoker  . Smokeless tobacco: Never  Used  Substance and Sexual Activity  . Alcohol use: No  . Drug use: No  . Sexual activity: Not Currently  Other Topics Concern  . Not on file  Social History Narrative   Daily caffeine use.   Social Determinants of Health   Financial Resource Strain: Low Risk   . Difficulty of Paying Living Expenses: Not hard at all  Food Insecurity: No Food Insecurity  . Worried About Charity fundraiser in the Last Year: Never true  . Ran Out of Food in the Last Year: Never true  Transportation Needs: No Transportation Needs  . Lack of Transportation (Medical): No  . Lack of Transportation (Non-Medical): No  Physical Activity: Sufficiently Active  . Days of Exercise per Week: 7 days  . Minutes of Exercise per Session: 30 min  Stress: No Stress Concern Present  . Feeling of Stress : Not at all  Social Connections:   . Frequency of Communication with Friends and Family:   . Frequency of Social Gatherings with Friends and Family:   . Attends Religious Services:   . Active Member of Clubs or Organizations:   . Attends Archivist Meetings:   Marland Kitchen Marital Status:    Tobacco Counseling Counseling given: Not Answered   Clinical Intake:  Pre-visit preparation completed: Yes  Pain : No/denies pain     Nutritional Status: BMI > 30  Obese Nutritional Risks: None Diabetes: Yes  How often do you need to have someone help you when you read instructions, pamphlets, or other written materials from your doctor or pharmacy?: 1 - Never What is the last grade level you completed in school?: college  Interpreter Needed?: No  Information entered by :: NAllen LPN  Activities of Daily Living In your present state of health, do you have any difficulty performing the following activities: 07/07/2019 03/28/2019  Hearing? Tempie Donning  Comment wears hearing aides -  Vision? Y N  Comment can't see out of right eye -  Difficulty concentrating or making decisions? Y Y  Comment short term memory -    Walking or climbing stairs? N Y  Dressing or bathing? N N  Doing errands, shopping? Tempie Donning  Comment someone drives him -  Conservation officer, nature and eating ? N -  Using the Toilet? N -  In the past six months, have you accidently leaked urine? N -  Do you have problems with loss of bowel control? Y -  Comment has short amount of colon -  Managing your Medications? N -  Managing your Finances? N -  Housekeeping or managing your Housekeeping? N -  Some recent data might be hidden     Immunizations and Health Maintenance Immunization History  Administered Date(s) Administered  . Fluad Quad(high Dose 65+) 03/29/2019  . Influenza-Unspecified 12/10/2017, 11/11/2018  . PFIZER SARS-COV-2 Vaccination 04/16/2019, 05/07/2019  . Pneumococcal Conjugate-13 04/10/2014  . Pneumococcal Polysaccharide-23 08/10/2013, 03/29/2019  . Td 09/10/2006, 11/11/2015  . Zoster Recombinat (Shingrix) 12/23/2016, 07/23/2017   There are no preventive care reminders to display for this patient.  Patient Care Team: Minette Brine, FNP as PCP - General (General Practice) Debara Pickett Nadean Corwin, MD as PCP - Cardiology (Cardiology) Deboraha Sprang, MD as PCP - Electrophysiology (Cardiology) Daneen Schick as Social Worker [place], Claudette Stapler, RN as Case Manager Lavonna Monarch, MD as Consulting Physician (Dermatology)  Indicate any recent Medical Services you may have received from other than [facility] providers in the past year (date may be approximate).  Assessment:   This is a routine wellness examination for Gary [name].  Hearing/Vision screen  Hearing Screening   '125Hz'$  '250Hz'$  '500Hz'$  '1000Hz'$  '2000Hz'$  '3000Hz'$  '4000Hz'$  '6000Hz'$  '8000Hz'$   Right ear:           Left ear:           Vision Screening Comments: Regular eye exams  Dietary issues and exercise activities discussed: Current Exercise Habits: Home exercise routine, Type of exercise: walking;strength training/weights, Time (Minutes): 30, Frequency (Times/Week): 7, Weekly Exercise  (Minutes/Week): 210  Goals    . "I need someone to help me check my blood sugars" (pt-stated)     CARE PLAN ENTRY (see longtitudinal plan of care for additional care plan information)  Current Barriers:  Marland Kitchen Knowledge Deficits related to disease process and Self Health management of Abnormal glucose . Lacks caregiver support.  . Chronic Disease Management support and education needs related to CKD stage 3, Abnormal glucose, Pulmonary Hypertension, coronary Atherosclerosis  Nurse Case Manager Clinical Goal(s):  Marland Kitchen Over the next 90 days, patient will work with the [place] and PCP to address needs related to disease education and support to improve Self management of DM and lower A1c <7.0  CCM SW Interventions: Completed 05/26/19 with souse Ann . Successful outbound call to the patients home to assess goal progression . Confirmed the patient has been successful with daily blood glucose monitoring with the help of his wife, Lelon Frohlich, whom reports goal of checking BID o Determined the patient morning readings trend higher than evening readings  o Morning readings are reported to range between 120-180 o Evening readings are reported to range between 120-140 o Determined the patient often awakes through the night to eat a snack. Ann reports the patients snack of choice to be peanut butter crackers . Discussed desire for Lelon Frohlich to obtain a safety lancet that is activated by pressing against the skin as a one time use versus current lancets which require loading a needle due to her own tremor o Delton See SW would collaborate with the patients primary care provider to request orders for safety lancets o Lelon Frohlich reports she is awaiting an order from [facility] and she feels she has ordered the appropriate lancets o Discussed plans for Ann to contact the patients provider should alternative lancets be needed . Advised Ann to bring blood sugar monitoring log to appointment on 4/15 in order to allow the patients provider  to review . Collaboration with Consulting civil engineer, Barb Merino and the patients primary provider, Minette Brine, FNP to communicate trends in blood sugar monitoring  CCM RN CM Interventions:  05/13/19 call completed with patient and wife  . Evaluation of current treatment plan related to Abnormal glucose and patient's adherence to plan as established by provider . Provided education to patient re: current A1c of 7.4; Educated on goal A1c <7.0 and how to achieve and maintain this goal with medication adherence, ADA diet adherence, exercise with 150 minutes per week recommended; achieving/maintaining healthy weight; Educated on daily glycemic goal of FBS 80-130 and <180 after meals; 15"15" rule . Determined patient is unable to Self check his CBG's at home due to having poor dexterity in his hands; his wife has severe tremors  . Sent in basket message to embedded BSW Daneen Schick requesting any available resources such as the [facility] A[facility] and [facility] that may be a good option for patient to receive assistance with monitoring CBG's . Determined patient's daughter whom lives with the patient is unable to  help check his CBG's; patient will consider asking a neighbor who is a nurse to help out with checking CBG's  . Reviewed medications with patient and discussed indication, dosage and frequency of prescribed medications; Determined patient is not currently prescribed antidiabetic medications . Discussed plans with patient for ongoing care management follow up and provided patient with direct contact information for care management team . Provided patient with printed educational materials related to Diabetes Management using the Plate Method; Diabetes Zone Safety Tool; signs/symptoms of Hyper/Hypoglycemia; Carb Choices . Reviewed scheduled/upcoming provider appointments including: PCP follow up with Minette Brine FNP scheduled for 06/03/19 '@2'$ :30 PM   Patient Self Care Activities:  . Self administers medications  as prescribed . Attends all scheduled provider appointments . Calls pharmacy for medication refills . Calls provider office for new concerns or questions . Unable to independently Self check CBG's  Please see past updates related to this goal by clicking on the "Past Updates" button in the selected goal      . "I would like to try to drnk more water to help my kidneys" (pt-stated)     [place] (see longtitudinal plan of care for additional care plan information)  Current Barriers:  Marland Kitchen Knowledge Deficits related to disease process and Self Health management of CKD . Lacks caregiver support.  . Chronic Disease Management support and education needs related to CKD stage 3, Abnormal glucose, Pulmonary hypertension, Coronary Atherosclerosis   Nurse Case Manager Clinical Goal(s):  Marland Kitchen Over the next 90 days, patient will work with the [place] and PCP to address needs related to disease education and support to improve Self Health management of CKD   CCM RN CM Interventions:  05/13/19 call completed with patient  . Evaluation of current treatment plan related to CKD and patient's adherence to plan as established by provider. . Provided education to patient re: stages of CKD; reviewed and discussed patient's current GFR 49 and how to improve kidney function by lowering his A1c, increasing exercise activity, drinking plenty of water, maintaining a healthy weight . Discussed plans with patient for ongoing care management follow up and provided patient with direct contact information for care management team . Provided patient with printed educational materials related to Stages of Chronic Kidney disease; 6 Ways to be Water Wise  05/20/19 Placed outbound call to [facility] to follow up on recent referral sent on 04/15/19 for dx: stage IV CKD . Spoke with representative who confirmed the referral was received and the patient will receive a call in the near future to schedule his new patient  appointment  . Determined the patient is running behind with scheduling new patient appointments and based on Mr. Leverich current renal function he will most likely be scheduled for June appointment . Discussed Mrs. Pascale was notified of this information during a recent call from her  Patient Self Care Activities:  . Self administers medications as prescribed . Attends all scheduled provider appointments . Calls pharmacy for medication refills . Calls provider office for new concerns or questions . Unable to independently check blood glucose levels at home due to poor dexterity in his hands  Please see past updates related to this goal by clicking on the "Past Updates" button in the selected goal      . Patient Stated     07/07/2019, trying to keep water intake up      Depression Screen PHQ 2/9 Scores 07/07/2019 04/08/2019  PHQ - 2 Score 0 1  PHQ- 9 Score 2 -    Fall Risk Fall Risk  07/07/2019 04/08/2019  Falls in the past year? 0 1  Comment tripped over a cord -  Number falls in past yr: - 1  Risk for fall due to : Impaired balance/gait;Medication side effect -  Follow up Falls evaluation completed;Education provided;Falls prevention discussed -    Is the patient's home free of loose throw rugs in walkways, pet beds, electrical cords, etc?   yes      Grab bars in the bathroom? no      Handrails on the stairs?   n/a      Adequate lighting?   yes  Timed Get Up and Go performed: n/a  Cognitive Function:     6CIT Screen 07/07/2019  What Year? 0 points  What month? 0 points  What time? 0 points  Count back from 20 0 points  Months in reverse 0 points  Repeat phrase 2 points  Total Score 2    Screening Tests Health Maintenance  Topic Date Due  . INFLUENZA VACCINE  09/12/2019  . TETANUS/TDAP  11/10/2025  . COVID-19 Vaccine  Completed  . PNA vac Low Risk Adult  Completed    Qualifies for Shingles Vaccine? yes  Cancer Screenings: Lung: Low Dose CT Chest  recommended if Age 2-80 years, 30 pack-year currently smoking OR have quit w/in 15years. Patient does not qualify. Colorectal: not required  Additional Screenings:  Hepatitis C Screening: n/a      Plan:    Patient is trying to keep increased water intake up.  I have personally reviewed and noted the following in the patient's chart:   . Medical and social history . Use of alcohol, tobacco or illicit drugs  . Current medications and supplements . Functional ability and status . Nutritional status . Physical activity . Advanced directives . List of other physicians . Hospitalizations, surgeries, and ER visits in previous 12 months . Vitals . Screenings to include cognitive, depression, and falls . Referrals and appointments  In addition, I have reviewed and discussed with patient certain preventive protocols, quality metrics, and best practice recommendations. A written personalized care plan for preventive services as well as general preventive health recommendations were provided to patient.     Kellie Simmering, LPN   9/38/1829

## 2019-07-07 NOTE — Progress Notes (Signed)
Virtual Visit via Telephone, he was unable to get the video to work on Gary [name]   This visit type was conducted due to national recommendations for restrictions regarding the COVID-19 Pandemic (e.g. social distancing) in an effort to limit this patient's exposure and mitigate transmission in our community.  Due to his co-morbid illnesses, this patient is at least at moderate risk for complications without adequate follow up.  This format is felt to be most appropriate for this patient at this time.  All issues noted in this document were discussed and addressed.  A limited physical exam was performed with this format.    This visit type was conducted due to national recommendations for restrictions regarding the COVID-19 Pandemic (e.g. social distancing) in an effort to limit this patient's exposure and mitigate transmission in our community.  Patients identity confirmed using two different identifiers.  This format is felt to be most appropriate for this patient at this time.  All issues noted in this document were discussed and addressed.  No physical exam was performed (except for noted visual exam findings with Video Visits).    Date:  07/15/2019   ID:  Gary [name], DOB 09/18/1932, MRN [id number]  Patient Location:  Home - spoke with Gary [name]  Provider location:   Office    Chief Complaint:  Follow up and would like lorazepam refilled  History of Present Illness:    Gary [name] is a 86 y.o. male who presents via video conferencing for a telehealth visit today.    The patient does not have symptoms concerning for COVID-19 infection (fever, chills, cough, or new shortness of breath).   Feels like his CPAP is doing better.  He rescheduled the podiatrist visit.  He is scheduled to see the ophthalmologist this week. He continues to go to [facility] orthopedic for his back problem (back sprain).   He reports he had been taking lorazepam for his sleep problems.   Diabetes He  presents for his follow-up diabetic visit. He has type 2 diabetes mellitus. There are no hypoglycemic associated symptoms. Pertinent negatives for hypoglycemia include no dizziness. There are no diabetic associated symptoms. Pertinent negatives for diabetes include no polydipsia, no polyphagia and no polyuria. There are no hypoglycemic complications. There are no diabetic complications. Risk factors for coronary artery disease include obesity, sedentary lifestyle, hypertension and diabetes mellitus. Current diabetic treatment includes oral agent (dual therapy). He is compliant with treatment most of the time. He is following a generally healthy diet. He has not had a previous visit with a dietitian. He rarely participates in exercise. (Blood sugar has been 104-114.  He is trying to avoid eating bad foods) He does not see a podiatrist.Eye exam is not current.  Insomnia Primary symptoms: no fragmented sleep, sleep disturbance, difficulty falling asleep.  The onset quality is gradual. The problem occurs intermittently. The problem is unchanged. PMH includes: no hypertension, no depression. Prior diagnostic workup includes:  Polysomnogram (Wears CPAP).     Past Medical History:  Diagnosis Date  . Adenomatous colon polyp    2010   . Anemia   . Anxiety   . Arthritis    "hips and lower back"  . Arthritis pain   . Atypical mole 06/21/2003   left neck, inferior - slight to moderate atypia  . Basal cell carcinoma 10/04/2014   right shin bcc cx3 15f  . Blood transfusion   . Chest pain 03/29/11   2D Echo without contrast on 03/29/11 - EF=  55-60%.  . Cholecystitis   . Chronic back pain greater than 3 months duration   . Chronic kidney disease   . Colon polyps   . Complication of anesthesia    once slow to wake up  . Coronary angioplasty status 2011  . Coronary artery disease   . Diarrhea   . Diverticulosis   . Dyslipidemia   . GERD (gastroesophageal reflux disease)   . GI bleeding after 07/2009    "triggered by Plavix & ASA; from my diverticulosis"  . H/O Clostridium difficile infection   . H/O myocardial perfusion scan 04/04/11   For Pre-noncardiac surgery - EF = 67%, no ischemia, and is considered low risk.  Marland Kitchen Heart attack ([place]) 07/2009   nonSTEMI with an occluded circumflex artery and collaterals.  Marland Kitchen Heart murmur   . Hemorrhoids   . History of shingles   . Hyperlipidemia   . Hypertension   . Kidney stones   . Lower extremity edema    Taking furosemide and it seems to be helping.  . Neuromuscular disorder ([place])   . Nonspecific chest pain 08/26/2012   Went to ED 08/26/12  . Squamous cell carcinoma of skin 05/13/2000   Right outer forehead  . Squamous cell carcinoma of skin 03/02/2001   Post crown - tx 03/19/2001  . Squamous cell carcinoma of skin 09/01/2001   Left post auricular - tx 10/09/2001  . Squamous cell carcinoma of skin 06/21/2003   Top right ear - tx 07/05/2003  . Squamous cell carcinoma of skin 06/21/2003   Right upper arm - tx 07/05/2003  . Squamous cell carcinoma of skin 10/12/2003   Left superior ear lobe - MOHs  . Squamous cell carcinoma of skin 12/15/2006   Right ear, superior - tx 01/26/2007  . Squamous cell carcinoma of skin 03/13/2009   Right temple - tx 05/08/2009  . Squamous cell carcinoma of skin 03/24/2013   Upper left forearm - tx p bx  . Squamous cell carcinoma of skin 12/06/2013   Right forearm - CX3 + 5FU  . Squamous cell carcinoma of skin 02/10/2014   Right hand - tx p bx  . Squamous cell carcinoma of skin 10/04/2014   Right shin - tx p bx  . Squamous cell carcinoma of skin 08/21/2016   Left hand - tx p bx  . Squamous cell carcinoma of skin 01/01/2017   Right hand - tx 02/06/2017  . Squamous cell carcinoma of skin 10/21/2017   Left side of scalp - tx 03/12/2018  . Squamous cell carcinoma of skin 10/21/2017   Left post scalp, inf - tx 08/13/2018  . Squamous cell carcinoma of skin 10/21/2017   Right scalp - tx p bx  . Squamous cell  carcinoma of skin 10/21/2017   Left cheek - tx 03/12/2018  . Squamous cell carcinoma of skin 08/13/2018   Left top hand, proximal pinky - tx p bx  . Squamous cell carcinoma of skin 12/23/2018   Right bicep lower - tx p bx  . Squamous cell carcinoma of skin 05/18/2019   Right 4th finger metacarpophalangeal joint  . Squamous cell carcinoma of skin 05/18/2019   Left 5th finger metacarpophalangeal joint  . Squamous cell carcinoma of skin 05/18/2019   Left 2nd finger metacarpophalangeal joint  . Stomach problems   . Syncope and collapse 04/16/2018   Past Surgical History:  Procedure Laterality Date  . BACK SURGERY  1997  . CARDIAC CATHETERIZATION  08/09/2009   Circumflex 100% occluded with  right-to-left collaterals, mild irregularities in the proximal and distal LAD and diagonal system, normal LV systolic function, and minor infrarenal irregularities, but no abdominal aortic aneurysm.  . CHOLECYSTECTOMY  04/12/2011   Procedure: CHOLECYSTECTOMY;  Surgeon: Adin Hector, MD;  Location: [place];  Service: General;  Laterality: N/A;  . CHOLECYSTECTOMY  04/12/2011   Procedure: LAPAROSCOPIC CHOLECYSTECTOMY;  Surgeon: Adin Hector, MD;  Location: [place];  Service: General;  Laterality: N/A;  converted to open  . COLON SURGERY     COLONOSCOPY  . ESOPHAGOGASTRODUODENOSCOPY N/A 08/05/2013   Procedure: ESOPHAGOGASTRODUODENOSCOPY (EGD);  Surgeon: Jerene Bears, MD;  Location: [place];  Service: Gastroenterology;  Laterality: N/A;  . EYE SURGERY  1942   "right eye was crossed; they  corrected it"  . LUMBAR FUSION  07/22/2012  . PACEMAKER IMPLANT N/A 04/17/2018   Procedure: PACEMAKER IMPLANT;  Surgeon: Deboraha Sprang, MD;  Location: [place] [facility];  Service: Cardiovascular;  Laterality: N/A;  . POSTERIOR FUSION LUMBAR SPINE  08/1995   "bottom 4 vertebra"  . SUBTOTAL COLECTOMY  01/24/2010  . TOTAL HIP ARTHROPLASTY Right 08/03/2013   Procedure: TOTAL HIP ARTHROPLASTY ANTERIOR APPROACH;  Surgeon:  Hessie Dibble, MD;  Location: [place];  Service: Orthopedics;  Laterality: Right;     Current Meds  Medication Sig  . acetaminophen (TYLENOL) 500 MG tablet Take 1,000 mg by mouth every 8 (eight) hours as needed for fever.  Marland Kitchen amLODipine (NORVASC) 5 MG tablet Take 1 tablet (5 mg total) by mouth daily.  Marland Kitchen aspirin EC 81 MG tablet Take 81 mg by mouth daily.  . blood glucose meter kit and supplies KIT Dispense based on patient and insurance preference. Use up to four times daily as directed. (FOR ICD-9 250.00, 250.01).  . carvedilol (COREG) 3.125 MG tablet Take 1 tablet (3.125 mg total) by mouth 2 (two) times daily with a meal.  . citalopram (CELEXA) 40 MG tablet Take 40 mg by mouth daily.  . Cyanocobalamin (VITAMIN B-12 IJ) Inject 1 Dose as directed once a week.  . doxazosin (CARDURA) 4 MG tablet Take 4 mg by mouth daily.   . fluticasone (FLONASE) 50 MCG/ACT nasal spray Place 2 sprays into both nostrils daily.   . furosemide (LASIX) 20 MG tablet Take 1 tablet (20 mg total) by mouth every other day.  . gabapentin (NEURONTIN) 100 MG capsule Take 4 capsules (400 mg total) by mouth 3 (three) times daily.  . imiquimod (ALDARA) 5 % cream Apply topically at bedtime. Apply to affected area Monday, weds & friday  . nitroGLYCERIN (NITROSTAT) 0.4 MG SL tablet PLACE 1 TABLET UNDER THE TONGUE EVERY 5 MINUTES AS NEEDED FOR CHEST PAIN FOR 3 DOSES (Patient taking differently: Place 0.4 mg under the tongue every 5 (five) minutes as needed for chest pain. )  . simvastatin (ZOCOR) 40 MG tablet TAKE 1 TABLET BY MOUTH AT BEDTIME (Patient taking differently: Take 40 mg by mouth at bedtime. )  . Tamsulosin HCl (FLOMAX) 0.4 MG CAPS Take 0.4 mg by mouth daily after supper.  . traMADol-acetaminophen (ULTRACET) 37.5-325 MG per tablet Take 2 tablets by mouth 3 (three) times daily.      Allergies:   Naproxen, Clopidogrel bisulfate, Sulfonamide derivatives, Other, Latex, and Tape   Social History   Tobacco Use  . Smoking  status: Never Smoker  . Smokeless tobacco: Never Used  Substance Use Topics  . Alcohol use: No  . Drug use: No     Family Hx: The  patient's family history includes Diverticulosis in his sister; Heart disease in his father; Pancreatic cancer in his mother; Stroke in his father. There is no history of Colon cancer.  ROS:   Please see the history of present illness.    Review of Systems  Constitutional: Negative.   Respiratory: Negative.   Cardiovascular: Negative.   Neurological: Negative for dizziness and tingling.  Endo/Heme/Allergies: Negative for polydipsia and polyphagia.  Psychiatric/Behavioral: Positive for sleep disturbance. Negative for depression. The patient has insomnia.     All other systems reviewed and are negative.   Labs/Other Tests and Data Reviewed:    Recent Labs: 03/28/2019: TSH 1.817 03/29/2019: Hemoglobin 13.7; Platelets 175 07/14/2019: ALT 13; BUN 20; Creatinine, Ser 1.66; Potassium 4.2; Sodium 140   Recent Lipid Panel Lab Results  Component Value Date/Time   CHOL  08/09/2009 09:49 AM    164        ATP III CLASSIFICATION:  <200     mg/dL   Desirable  200-239  mg/dL   Borderline High  >=240    mg/dL   High          TRIG 114 08/09/2009 09:49 AM   HDL 36 (L) 08/09/2009 09:49 AM   CHOLHDL 4.6 08/09/2009 09:49 AM   LDLCALC (H) 08/09/2009 09:49 AM    105        Total Cholesterol/HDL:CHD Risk Coronary Heart Disease Risk Table                     Men   Women  1/2 Average Risk   3.4   3.3  Average Risk       5.0   4.4  2 X Average Risk   9.6   7.1  3 X Average Risk  23.4   11.0        Use the calculated Patient Ratio above and the CHD Risk Table to determine the patient's CHD Risk.        ATP III CLASSIFICATION (LDL):  <100     mg/dL   Optimal  100-129  mg/dL   Near or Above                    Optimal  130-159  mg/dL   Borderline  160-189  mg/dL   High  >190     mg/dL   Very High    Wt Readings from Last 3 Encounters:  07/07/19 204 lb (92.5  kg)  07/07/19 204 lb (92.5 kg)  06/07/19 206 lb (93.4 kg)     Exam:    Vital Signs:  BP 121/66 Comment: per wife  Temp 98.4 F (36.9 C) Comment: per wife  Wt 204 lb (92.5 kg) Comment: per wife  SpO2 96% Comment: per wife  BMI 35.46 kg/m     Physical Exam  Constitutional: He is oriented to person, place, and time and well-developed, well-nourished, and in no distress. No distress.  Neurological: He is alert and oriented to person, place, and time.  Psychiatric: Mood, memory, affect and judgment normal.    ASSESSMENT & PLAN:    1. Other insomnia  Will try him on temazepam vs lorazepam - temazepam (RESTORIL) 7.5 MG capsule; Take 1 capsule (7.5 mg total) by mouth at bedtime as needed for sleep.  Dispense: 30 capsule; Refill: 2  2. Abnormal glucose  He will come to office next week for labs - Hemoglobin A1c; Future  3. Essential hypertension . B/P is well controlled.  Marland Kitchen  CMP ordered to check renal function.  . The importance of regular exercise as tolerated and dietary modification was stressed to the patient.  - CMP14+EGFR; Future   COVID-19 Education: The signs and symptoms of COVID-19 were discussed with the patient and how to seek care for testing (follow up with PCP or arrange E-visit).  The importance of social distancing was discussed today.  Patient Risk:   After full review of this patients clinical status, I feel that they are at least moderate risk at this time.  Time:   Today, I have spent 19.58 minutes/ seconds with the patient with telehealth technology discussing above diagnoses.     Medication Adjustments/Labs and Tests Ordered: Current medicines are reviewed at length with the patient today.  Concerns regarding medicines are outlined above.   Tests Ordered: Orders Placed This Encounter  Procedures  . CMP14+EGFR  . Hemoglobin A1c    Medication Changes: Meds ordered this encounter  Medications  . temazepam (RESTORIL) 7.5 MG capsule    Sig:  Take 1 capsule (7.5 mg total) by mouth at bedtime as needed for sleep.    Dispense:  30 capsule    Refill:  2    Disposition:  Follow up in 3 month(s)  Signed, Minette Brine, FNP

## 2019-07-07 NOTE — Telephone Encounter (Signed)
Patient has consent to a telemedicine visit

## 2019-07-08 NOTE — Addendum Note (Signed)
Addended by: Ashok Cordia B on: 07/08/2019 02:12 PM   Modules accepted: Orders

## 2019-07-13 ENCOUNTER — Encounter: Payer: Self-pay | Admitting: Nurse Practitioner

## 2019-07-13 ENCOUNTER — Telehealth: Payer: Self-pay

## 2019-07-14 ENCOUNTER — Other Ambulatory Visit: Payer: Self-pay | Admitting: Nurse Practitioner

## 2019-07-14 LAB — CMP14+EGFR
ALT: 13 IU/L (ref 0–44)
AST: 20 IU/L (ref 0–40)
Albumin/Globulin Ratio: 2.3 — ABNORMAL HIGH (ref 1.2–2.2)
Albumin: 4.6 g/dL (ref 3.6–4.6)
Alkaline Phosphatase: 43 IU/L — ABNORMAL LOW (ref 48–121)
BUN/Creatinine Ratio: 12 (ref 10–24)
BUN: 20 mg/dL (ref 8–27)
Bilirubin Total: 0.4 mg/dL (ref 0.0–1.2)
CO2: 25 mmol/L (ref 20–29)
Calcium: 9.3 mg/dL (ref 8.6–10.2)
Chloride: 101 mmol/L (ref 96–106)
Creatinine, Ser: 1.66 mg/dL — ABNORMAL HIGH (ref 0.76–1.27)
GFR calc Af Amer: 43 mL/min/{1.73_m2} — ABNORMAL LOW (ref >59)
GFR calc non Af Amer: 37 mL/min/{1.73_m2} — ABNORMAL LOW (ref >59)
Globulin, Total: 2 g/dL (ref 1.5–4.5)
Glucose: 119 mg/dL — ABNORMAL HIGH (ref 65–99)
Potassium: 4.2 mmol/L (ref 3.5–5.2)
Sodium: 140 mmol/L (ref 134–144)
Total Protein: 6.6 g/dL (ref 6.0–8.5)

## 2019-07-15 LAB — HEMOGLOBIN A1C
Est. average glucose Bld gHb Est-mCnc: 154 mg/dL
Hgb A1c MFr Bld: 7 % — ABNORMAL HIGH (ref 4.8–5.6)

## 2019-07-19 ENCOUNTER — Ambulatory Visit: Payer: Medicare PPO | Admitting: Podiatry

## 2019-07-21 ENCOUNTER — Encounter: Payer: Self-pay | Admitting: Nurse Practitioner

## 2019-07-22 ENCOUNTER — Ambulatory Visit: Payer: Self-pay

## 2019-07-22 ENCOUNTER — Other Ambulatory Visit: Payer: Self-pay | Admitting: Nurse Practitioner

## 2019-07-22 DIAGNOSIS — I10 Essential (primary) hypertension: Secondary | ICD-10-CM

## 2019-07-22 MED ORDER — TRAMADOL-ACETAMINOPHEN 37.5-325 MG PO TABS: 1.0000 | ORAL_TABLET | Freq: Three times a day (TID) | ORAL | 0 refills | Status: DC | PRN

## 2019-07-22 NOTE — Chronic Care Management (AMB) (Signed)
  Chronic Care Management   Outreach Note  07/22/2019 Name: Gary [name] MRN: [id number] DOB: 1932-07-26  Referred by: Minette Brine, FNP Reason for referral : Care Coordination   SW placed successful outbound call to the patients spouse, Gary [name], in response to voice message received. Ann requested assistance with medication refills. Upon return call it was determined [name] was able to speak with primary care team regarding refill needed. No acute needs at this time.   Follow Up Plan: No SW follow up planned at this time. [name] is encouraged to contact SW as needed.  Daneen Schick, BSW, CDP Social Worker, Certified Dementia Practitioner [facility] [phone]

## 2019-07-23 ENCOUNTER — Other Ambulatory Visit: Payer: Self-pay | Admitting: Nurse Practitioner

## 2019-07-23 MED ORDER — TRAMADOL-ACETAMINOPHEN 37.5-325 MG PO TABS: 2.0000 | ORAL_TABLET | Freq: Three times a day (TID) | ORAL | 0 refills | Status: DC

## 2019-07-27 ENCOUNTER — Ambulatory Visit: Payer: Medicare PPO | Admitting: Dermatology

## 2019-07-27 ENCOUNTER — Encounter: Payer: Self-pay | Admitting: Nurse Practitioner

## 2019-08-02 ENCOUNTER — Encounter: Payer: Self-pay | Admitting: Nurse Practitioner

## 2019-08-05 ENCOUNTER — Ambulatory Visit: Payer: Self-pay

## 2019-08-05 ENCOUNTER — Telehealth: Payer: Self-pay

## 2019-08-05 ENCOUNTER — Ambulatory Visit (INDEPENDENT_AMBULATORY_CARE_PROVIDER_SITE_OTHER): Payer: Medicare PPO

## 2019-08-05 ENCOUNTER — Other Ambulatory Visit: Payer: Self-pay

## 2019-08-05 DIAGNOSIS — N1832 Chronic kidney disease, stage 3b: Secondary | ICD-10-CM

## 2019-08-05 DIAGNOSIS — R7309 Other abnormal glucose: Secondary | ICD-10-CM

## 2019-08-05 DIAGNOSIS — I2510 Atherosclerotic heart disease of native coronary artery without angina pectoris: Secondary | ICD-10-CM

## 2019-08-05 DIAGNOSIS — G8929 Other chronic pain: Secondary | ICD-10-CM

## 2019-08-05 DIAGNOSIS — I10 Essential (primary) hypertension: Secondary | ICD-10-CM | POA: Diagnosis not present

## 2019-08-05 NOTE — Chronic Care Management (AMB) (Signed)
  Chronic Care Management   Outreach Note  08/05/2019 Name: Gary [name] MRN: [id number] DOB: 1932/11/23  Referred by: Minette Brine, FNP Reason for referral : Care Coordination   SW received inbound call from the patients spouse, Gary [name], requesting SW assistance with medication refills. Mrs. Shehan reports concern surrounding need for patient refill of Tramadol and concern due to barriers with obtaining refills. Mrs. Valente reports the patient has been on this medication since 2014 and she has recently been told by both his ortho MD and primary provider he would need a referral to a pain clinic to continue medication. Mrs. Binz inquired if the patient would need a new provider primary in order to obtain medication refills.  SW reviewed chart and discussed recent MyChart correspondence with the patients primary provider on 6/21 indicating the patient would need to make an OV prior to prescription running out to discuss a plan for the patient. SW advised Mrs. Alberty, this SW was unable to assist with medication refills.    Follow Up Plan: Mrs. Hollar stated understanding and is aware the patient will need a follow up appointment with his provider. SW collaborated with Consulting civil engineer regarding patient concern and plan.  Daneen Schick, BSW, CDP Social Worker, Certified Dementia Practitioner [facility] [phone]

## 2019-08-09 NOTE — Chronic Care Management (AMB) (Signed)
Chronic Care Management   Follow Up Note   08/06/2019 Name: Gary [name] MRN: [id number] DOB: September 09, 1932  Referred by: Gary Brine, FNP Reason for referral : Chronic Care Management (FU RN Call - Nephrology/DM )   Gary [name] is a 86 y.o. year old male who is a primary care patient of Gary [name], Gary [name]. The CCM team was consulted for assistance with chronic disease management and care coordination needs.    Review of patient status, including review of consultants reports, relevant laboratory and other test results, and collaboration with appropriate care team members and the patient's provider was performed as part of comprehensive patient evaluation and provision of chronic care management services.    SDOH (Social Determinants of Health) assessments performed: Yes - no acute needs  See Care Plan activities for detailed interventions related to Gary [name])   Placed outbound call to patient for CCM RN CM care plan follow up.     Outpatient Encounter Medications as of 08/05/2019  Medication Sig  . acetaminophen (TYLENOL) 500 MG tablet Take 1,000 mg by mouth every 8 (eight) hours as needed for fever.  Marland Kitchen aspirin EC 81 MG tablet Take 81 mg by mouth daily.  . blood glucose meter kit and supplies KIT Dispense based on patient and insurance preference. Use up to four times daily as directed. (FOR ICD-9 250.00, 250.01).  . carvedilol (COREG) 3.125 MG tablet Take 1 tablet (3.125 mg total) by mouth 2 (two) times daily with a meal.  . citalopram (CELEXA) 40 MG tablet Take 40 mg by mouth daily.  . Cyanocobalamin (VITAMIN B-12 IJ) Inject 1 Dose as directed once a week.  . doxazosin (CARDURA) 4 MG tablet Take 4 mg by mouth daily.   . fluticasone (FLONASE) 50 MCG/ACT nasal spray Place 2 sprays into both nostrils daily.   Marland Kitchen gabapentin (NEURONTIN) 100 MG capsule Take 4 capsules (400 mg total) by mouth 3 (three) times daily.  . imiquimod (ALDARA) 5 % cream Apply topically at bedtime. Apply to  affected area Monday, weds & friday  . mupirocin ointment (BACTROBAN) 2 % Apply 1 application topically 2 (two) times daily.  . nitroGLYCERIN (NITROSTAT) 0.4 MG SL tablet PLACE 1 TABLET UNDER THE TONGUE EVERY 5 MINUTES AS NEEDED FOR CHEST PAIN FOR 3 DOSES (Patient taking differently: Place 0.4 mg under the tongue every 5 (five) minutes as needed for chest pain. )  . simvastatin (ZOCOR) 40 MG tablet TAKE 1 TABLET BY MOUTH AT BEDTIME (Patient taking differently: Take 40 mg by mouth at bedtime. )  . Tamsulosin HCl (FLOMAX) 0.4 MG CAPS Take 0.4 mg by mouth daily after supper.  . temazepam (RESTORIL) 7.5 MG capsule Take 1 capsule (7.5 mg total) by mouth at bedtime as needed for sleep. (Patient not taking: Reported on 08/09/2019)  . traMADol-acetaminophen (ULTRACET) 37.5-325 MG tablet Take 2 tablets by mouth 3 (three) times daily.  . vitamin B-12 1000 MCG tablet Take 1 tablet (1,000 mcg total) by mouth daily.  . [DISCONTINUED] furosemide (LASIX) 20 MG tablet Take 1 tablet (20 mg total) by mouth every other day. (Patient not taking: Reported on 08/06/2019)   No facility-administered encounter medications on file as of 08/05/2019.     Objective:  Lab Results  Component Value Date   HGBA1C 7.0 (H) 07/14/2019   HGBA1C 7.4 (H) 03/28/2019   HGBA1C (H) 08/09/2009    6.2 (NOTE)  According to the ADA Clinical Practice Recommendations for 2011, when HbA1c is used as a screening test:   >=6.5%   Diagnostic of Diabetes Mellitus           (if abnormal result  is confirmed)  5.7-6.4%   Increased risk of developing Diabetes Mellitus  References:Diagnosis and Classification of Diabetes Mellitus,Diabetes Care,2011,34(Suppl 1):S62-S69 and Standards of Medical Care in         Diabetes - 2011,Diabetes Care,2011,34  (Suppl 1):S11-S61.   BP Readings from Last 3 Encounters:  07/07/19 121/66  07/07/19 121/66  06/07/19 132/72     Goals Addressed       Patient Stated   .  "I am concerned about not having Tramadol to take for pain" (pt-stated)        CARE PLAN ENTRY (see longitudinal plan of care for additional care plan information)  Current Barriers:  . Knowledge Deficits related to evaluation and treatment of chronic pain secondary to Shingles related Neuropathic pain . Chronic Disease Management support and education needs related to CKD stage 3, Pulmonary Hypertension, Coronary Atherosclerosis, Abnormal glucose  Nurse Case Manager Clinical Goal(s):  . Over the next 90 days, patient will work with the CCM team and PCP to address needs related to evaluation and treatment to improve Self Health management of Shingles related Neuropathic pain   CCM RN CM Interventions:  . Inter-disciplinary care team collaboration (see longitudinal plan of care) . Evaluation of current treatment plan related to Chronic pain secondary to Shingle related Neuropathic pain and patient's adherence to plan as established by provider . Determined patient was prescribed Tramadol-Acetaminophen 37.5-325 mg tablet, take 2 tablets three times daily for Neuropathic pain secondary to having a severe Shingles outbreak by prior PCP approximately 15 years ago with good effectiveness for this pain . Determined patient's wife attempted to refill this medication and PCP Gary Moore, FNP prescribed a lower dose with a message to have patient come in for an OV for further evaluation and treatment of his pain regimen . Discussed the patient and his wife are concerned the patient will have withdrawal symptoms if not properly titrated to a lower dose, they are also concerned about taking other pain medications not used in the past, they are concerned about not having the patient's Neuropathic pain well controlled if is current pain regimen is changed   . Educated patient and wife on potential benefits of having PCP re-evaluate and discuss further for best treatment options face to face;  Educated on importance of consideration of patient's chronic kidney disease for safest pain treatment including dosage and frequency . Scheduled an in office face to face OV with PCP provider Gary Moore, FNP for 08/10/19 @ 2:45 PM and Mrs. Boylan will accompanying the patient during this visit  . Collaborated with PCP via in basket message regarding patient/wife concerns regarding the concerns listed above . Discussed plans with patient for ongoing care management follow up and provided patient with direct contact information for care management team  Patient Self Care Activities:  . Attends all scheduled provider appointments . Calls pharmacy for medication refills . Calls provider office for new concerns or questions . Supportive wife to assist with care needs  Initial goal documentation     .  "I am concerned about the skin cancers that keep showing up" (pt-stated)        CARE PLAN ENTRY (see longitudinal plan of care for additional care plan information)  Current Barriers:  . Knowledge Deficits related   to disease process and Self Health management of Squamous Cell Carcinoma  . Chronic Disease Management support and education needs related to CKD stage 3, Pulmonary Hypertension, Coronary Atherosclerosis, Abnormal glucose  Nurse Case Manager Clinical Goal(s):  . Over the next 90 days, patient will work with the CCM team and Dermatologist to address needs related to disease education and support of treatment for Squamous Cell Carcinoma   CCM RN CM Interventions:  08/06/19 call completed with patient and wife Ann  . Inter-disciplinary care team collaboration (see longitudinal plan of care) . Evaluation of current treatment plan related to Squamous Cell Carcinoma and patient's adherence to plan as established by provider . Determined patient is established with Dr. Tafeen with [facility] with last OV noted on 06/10/19 with the following Assessment/Plan noted:  o Squamous cell  carcinoma in situ (3) o  Right Hand - Posterior   o All sites of previous biopsy/curet still will crusting. Cannot rule out residual carcinoma but Mr. Slivka would understandably prefer to avoid more surgery. o   o   o    o  Right Hand - Posterior o    o  Left Hand - Posterior o    o Each spot treated with 15sec LN2 freeze; this will be followed by 20 applications 3x/week imiquimod o   o    Return in about 4 months (around 10/10/2019) for f/u on left hand . Discussed patient and wife verbalize understanding of this treatment plan and recommendations for follow up . Discussed plans with patient for ongoing care management follow up and provided patient with direct contact information for care management team  Patient Self Care Activities:  . Attends all scheduled provider appointments . Calls pharmacy for medication refills . Calls provider office for new concerns or questions . Supportive wife to assist with care needs   Initial goal documentation     .  "I need someone to help me check my blood sugars" (pt-stated)   On track     CARE PLAN ENTRY (see longtitudinal plan of care for additional care plan information)  Current Barriers:  . Knowledge Deficits related to disease process and Self Health management of Abnormal glucose . Lacks caregiver support.  . Chronic Disease Management support and education needs related to CKD stage 3, Abnormal glucose, Pulmonary Hypertension, coronary Atherosclerosis  Nurse Case Manager Clinical Goal(s):  . Over the next 90 days, patient will work with the CCM RN and PCP to address needs related to disease education and support to improve Self management of DM and lower A1c <7.0  CCM RN CM Interventions:  08/06/19 call completed with patient and wife  . Evaluation of current treatment plan related to Abnormal glucose and patient's adherence to plan as established by provider . Reviewed and discussed patients current A1c has decreased from 7.0  from 7.4%; Reiterated target goal A1c <7.0 and how to achieve and maintain this goal with medication adherence, ADA diet adherence, exercise with 150 minutes per week recommended; achieving/maintaining healthy weight . Determined wife is now able to check CBG's since changing to new glucose pen with lancet; Reviewed and discussed recorded FBS and Reiterated target daily glycemic ranges FBS 80-130 and <180 after meals; Educated on 15"15" rule . Discussed plans with patient for ongoing care management follow up and provided patient with direct contact information for care management team . Provided patient with printed educational materials related to Diabetes Care Schedule; Prevent Complications: Living with Diabetes; Grocery Shopping with Diabetes    Patient Self Care Activities:  . Self administers medications as prescribed . Attends all scheduled provider appointments . Calls pharmacy for medication refills . Calls provider office for new concerns or questions . Unable to independently Self check CBG's  Please see past updates related to this goal by clicking on the "Past Updates" button in the selected goal      .  "I would like to try to drnk more water to help my kidneys" (pt-stated)   On track     [place] (see longtitudinal plan of care for additional care plan information)  Current Barriers:  Marland Kitchen Knowledge Deficits related to disease process and Self Health management of CKD . Lacks caregiver support.  . Chronic Disease Management support and education needs related to CKD stage 3, Abnormal glucose, Pulmonary hypertension, Coronary Atherosclerosis   Nurse Case Manager Clinical Goal(s):  Marland Kitchen Over the next 90 days, patient will work with the [place] and PCP to address needs related to disease education and support to improve Self Health management of CKD   CCM RN CM Interventions:  08/06/19 call completed with patient  . Evaluation of current treatment plan related to CKD and  patient's adherence to plan as established by provider. . Provided education to patient re: stages of CKD; reviewed and discussed patient's current GFR 24; Educated on disease process and ways to improve kidney function by lowering his A1c, increasing exercise activity, drinking plenty of water, maintaining a healthy weight . Discussed plans with patient for ongoing care management follow up and provided patient with direct contact information for care management team . Provided patient with printed educational materials related to Diabetes and Kidney disease: What to Eat; The Best Salt Substitutes for Kidney patients . Determined patient has a Nephrology appointment scheduled with [facility] on August 13 th with Dr. Justin Mend for evaluation and treatment recommendations for CKD   Patient Self Care Activities:  . Self administers medications as prescribed . Attends all scheduled provider appointments . Calls pharmacy for medication refills . Calls provider office for new concerns or questions . Unable to independently check blood glucose levels at home due to poor dexterity in his hands  Please see past updates related to this goal by clicking on the "Past Updates" button in the selected goal        Plan:   Telephone follow up appointment with care management team member scheduled for: 09/20/19  Barb Merino, RN, BSN, CCM Care Management Coordinator [facility]  Direct Phone: [phone]

## 2019-08-09 NOTE — Patient Instructions (Addendum)
Visit Information  Goals Addressed      Patient Stated   .  "I am concerned about not having Tramadol to take for pain" (pt-stated)        CARE PLAN ENTRY (see longitudinal plan of care for additional care plan information)  Current Barriers:  Marland Kitchen Knowledge Deficits related to evaluation and treatment of chronic pain secondary to Shingles related Neuropathic pain . Chronic Disease Management support and education needs related to CKD stage 3, Pulmonary Hypertension, Coronary Atherosclerosis, Abnormal glucose  Nurse Case Manager Clinical Goal(s):  Marland Kitchen Over the next 90 days, patient will work with the CCM team and PCP to address needs related to evaluation and treatment to improve Self Health management of Shingles related Neuropathic pain   CCM RN CM Interventions:  . Inter-disciplinary care team collaboration (see longitudinal plan of care) . Evaluation of current treatment plan related to Chronic pain secondary to Shingle related Neuropathic pain and patient's adherence to plan as established by provider . Determined patient was prescribed Tramadol-Acetaminophen 37.5-325 mg tablet, take 2 tablets three times daily for Neuropathic pain secondary to having a severe Shingles outbreak by prior PCP approximately 15 years ago with good effectiveness for this pain . Determined patient's wife attempted to refill this medication and PCP Minette Brine, FNP prescribed a lower dose with a message to have patient come in for an OV for further evaluation and treatment of his pain regimen . Discussed the patient and his wife are concerned the patient will have withdrawal symptoms if not properly titrated to a lower dose, they are also concerned about taking other pain medications not used in the past, they are concerned about not having the patient's Neuropathic pain well controlled if is current pain regimen is changed   . Educated patient and wife on potential benefits of having PCP re-evaluate and discuss  further for best treatment options face to face; Educated on importance of consideration of patient's chronic kidney disease for safest pain treatment including dosage and frequency . Scheduled an in office face to face OV with PCP provider Minette Brine, FNP for 08/10/19 @ 2:45 PM and Mrs. Alumbaugh will accompanying the patient during this visit  . Collaborated with PCP via in basket message regarding patient/wife concerns regarding the concerns listed above . Discussed plans with patient for ongoing care management follow up and provided patient with direct contact information for care management team  Patient Self Care Activities:  . Attends all scheduled provider appointments . Calls pharmacy for medication refills . Calls provider office for new concerns or questions . Supportive wife to assist with care needs  Initial goal documentation     .  "I am concerned about the skin cancers that keep showing up" (pt-stated)        [place] (see longitudinal plan of care for additional care plan information)  Current Barriers:  Marland Kitchen Knowledge Deficits related to disease process and Self Health management of Squamous Cell Carcinoma  . Chronic Disease Management support and education needs related to CKD stage 3, Pulmonary Hypertension, Coronary Atherosclerosis, Abnormal glucose  Nurse Case Manager Clinical Goal(s):  Marland Kitchen Over the next 90 days, patient will work with the CCM team and Dermatologist to address needs related to disease education and support of treatment for Squamous Cell Carcinoma   CCM RN CM Interventions:  08/06/19 call completed with patient and wife Lelon Frohlich  . Inter-disciplinary care team collaboration (see longitudinal plan of care) . Evaluation of current treatment plan related  to Squamous Cell Carcinoma and patient's adherence to plan as established by provider . Determined patient is established with Dr. Denna Haggard with [facility] with last OV noted on 06/10/19 with the  following Assessment/Plan noted:  o Squamous cell carcinoma in situ (3) o  Right Hand - Posterior   o All sites of previous biopsy/curet still will crusting. Cannot rule out residual carcinoma but Mr. Mosco would understandably prefer to avoid more surgery. o   o   o    o  Right Hand - Posterior o    o  Left Hand - Posterior o    o Each spot treated with 15sec LN2 freeze; this will be followed by 20 applications 3x/week imiquimod o   o    Return in about 4 months (around 10/10/2019) for f/u on left hand . Discussed patient and wife verbalize understanding of this treatment plan and recommendations for follow up . Discussed plans with patient for ongoing care management follow up and provided patient with direct contact information for care management team  Patient Self Care Activities:  . Attends all scheduled provider appointments . Calls pharmacy for medication refills . Calls provider office for new concerns or questions . Supportive wife to assist with care needs   Initial goal documentation     .  "I need someone to help me check my blood sugars" (pt-stated)   On track     [place] (see longtitudinal plan of care for additional care plan information)  Current Barriers:  Marland Kitchen Knowledge Deficits related to disease process and Self Health management of Abnormal glucose . Lacks caregiver support.  . Chronic Disease Management support and education needs related to CKD stage 3, Abnormal glucose, Pulmonary Hypertension, coronary Atherosclerosis  Nurse Case Manager Clinical Goal(s):  Marland Kitchen Over the next 90 days, patient will work with the [place] and PCP to address needs related to disease education and support to improve Self management of DM and lower A1c <7.0  CCM RN CM Interventions:  08/06/19 call completed with patient and wife  . Evaluation of current treatment plan related to Abnormal glucose and patient's adherence to plan as established by provider . Reviewed and  discussed patients current A1c has decreased from 7.0 from 7.4%; Reiterated target goal A1c <7.0 and how to achieve and maintain this goal with medication adherence, ADA diet adherence, exercise with 150 minutes per week recommended; achieving/maintaining healthy weight . Determined wife is now able to check CBG's since changing to new glucose pen with lancet; Reviewed and discussed recorded FBS and Reiterated target daily glycemic ranges FBS 80-130 and <180 after meals; Educated on 15"15" rule . Discussed plans with patient for ongoing care management follow up and provided patient with direct contact information for care management team . Provided patient with printed educational materials related to Diabetes Care Schedule; Prevent Complications: Living with Diabetes; Grocery Shopping with Diabetes  Patient Self Care Activities:  . Self administers medications as prescribed . Attends all scheduled provider appointments . Calls pharmacy for medication refills . Calls provider office for new concerns or questions . Unable to independently Self check CBG's  Please see past updates related to this goal by clicking on the "Past Updates" button in the selected goal      .  "I would like to try to drnk more water to help my kidneys" (pt-stated)   On track     [place] (see longtitudinal plan of care for additional care plan information)  Current Barriers:  Marland Kitchen Knowledge Deficits related to disease process and Self Health management of CKD . Lacks caregiver support.  . Chronic Disease Management support and education needs related to CKD stage 3, Abnormal glucose, Pulmonary hypertension, Coronary Atherosclerosis   Nurse Case Manager Clinical Goal(s):  Marland Kitchen Over the next 90 days, patient will work with the [place] and PCP to address needs related to disease education and support to improve Self Health management of CKD   CCM RN CM Interventions:  08/06/19 call completed with patient   . Evaluation of current treatment plan related to CKD and patient's adherence to plan as established by provider. . Provided education to patient re: stages of CKD; reviewed and discussed patient's current GFR 69; Educated on disease process and ways to improve kidney function by lowering his A1c, increasing exercise activity, drinking plenty of water, maintaining a healthy weight . Discussed plans with patient for ongoing care management follow up and provided patient with direct contact information for care management team . Provided patient with printed educational materials related to Diabetes and Kidney disease: What to Eat; The Best Salt Substitutes for Kidney patients . Determined patient has a Nephrology appointment scheduled with [facility] on August 13 th with Dr. Justin Mend for evaluation and treatment recommendations for CKD   Patient Self Care Activities:  . Self administers medications as prescribed . Attends all scheduled provider appointments . Calls pharmacy for medication refills . Calls provider office for new concerns or questions . Unable to independently check blood glucose levels at home due to poor dexterity in his hands  Please see past updates related to this goal by clicking on the "Past Updates" button in the selected goal        Patient verbalizes understanding of instructions provided today.   Telephone follow up appointment with care management team member scheduled for: 09/20/19  Barb Merino, RN, BSN, CCM Care Management Coordinator [facility] [facility]  Direct Phone: [phone]

## 2019-08-10 ENCOUNTER — Encounter: Payer: Self-pay | Admitting: Nurse Practitioner

## 2019-08-10 ENCOUNTER — Other Ambulatory Visit: Payer: Self-pay | Admitting: Nurse Practitioner

## 2019-08-10 ENCOUNTER — Telehealth: Payer: Self-pay

## 2019-08-10 MED ORDER — GABAPENTIN 100 MG PO CAPS: 400.0000 mg | ORAL_CAPSULE | Freq: Three times a day (TID) | ORAL | 0 refills | Status: DC

## 2019-08-10 MED ORDER — GABAPENTIN 100 MG PO CAPS: ORAL_CAPSULE | ORAL | 0 refills | Status: DC

## 2019-08-10 NOTE — Telephone Encounter (Signed)
Looked at dispense hx for gadapentin

## 2019-08-11 ENCOUNTER — Other Ambulatory Visit: Payer: Self-pay | Admitting: Nurse Practitioner

## 2019-08-11 ENCOUNTER — Encounter: Payer: Self-pay | Admitting: Nurse Practitioner

## 2019-08-11 MED ORDER — GABAPENTIN 400 MG PO CAPS: ORAL_CAPSULE | ORAL | 1 refills | Status: DC

## 2019-08-13 ENCOUNTER — Encounter: Payer: Self-pay | Admitting: Podiatry

## 2019-08-13 ENCOUNTER — Other Ambulatory Visit: Payer: Self-pay

## 2019-08-13 ENCOUNTER — Ambulatory Visit: Payer: Medicare PPO | Admitting: Podiatry

## 2019-08-13 DIAGNOSIS — L603 Nail dystrophy: Secondary | ICD-10-CM | POA: Diagnosis not present

## 2019-08-13 DIAGNOSIS — E1140 Type 2 diabetes mellitus with diabetic neuropathy, unspecified: Secondary | ICD-10-CM

## 2019-08-13 NOTE — Progress Notes (Signed)
°  Subjective:  Patient ID: Gary [name], male    DOB: 1932-09-30,  MRN: [id number]  Chief Complaint  Patient presents with   Ingrown Toenail    Right great toe. Has been an problem for a year or so. Soreness on the hallux pain.    Nail Trim    86 y.o. male presents with the above complaint. History confirmed with patient.  His wife is here with him today.  He was recently diagnosed with diabetes.  He is unable to cut his toenails due to low back pain with sciatica.  He also thinks the right great toenail is starting to digging into the corners little bit.  Objective:  Physical Exam: warm, good capillary refill, no trophic changes or ulcerative lesions, normal DP and PT pulses and reduced sensation at distal toe pulps and sole of foot bilaterally. Left Foot: Elongated dystrophic toenails x5  Right Foot: Elongated dystrophic toenails x5, with slightly incurvated right lateral hallux border   No images are attached to the encounter.  Radiographs:  Assessment:   1. Controlled type 2 diabetes with neuropathy ([place])   2. Dystrophic nail      Plan:  Patient was evaluated and treated and all questions answered.  Diabetes -Patient is diabetic with a qualifying condition for at risk foot care. -Educated on DM Footcare.  Procedure: Nail Debridement Rationale: Patient meets criteria for routine foot care due to neuropathy Type of Debridement: manual, sharp debridement. Instrumentation: Nail nipper, rotary burr. Number of Nails: 10    Return in about 3 months (around 11/13/2019) for diabetic foot check and nail trim with any provider.

## 2019-08-17 ENCOUNTER — Encounter: Payer: Self-pay | Admitting: Nurse Practitioner

## 2019-08-18 ENCOUNTER — Encounter: Payer: Self-pay | Admitting: Nurse Practitioner

## 2019-08-18 ENCOUNTER — Ambulatory Visit: Payer: Medicare PPO | Admitting: Nurse Practitioner

## 2019-08-18 NOTE — Telephone Encounter (Signed)
Called pharmacy they stated that pt needs to call [facility] and have them discontinue service.  Called and spoke with wife (DPR) she states that he never started using them. She will call then call [facility] back and see if they need anything

## 2019-08-19 ENCOUNTER — Other Ambulatory Visit: Payer: Self-pay | Admitting: Nurse Practitioner

## 2019-08-19 ENCOUNTER — Ambulatory Visit: Payer: Self-pay

## 2019-08-19 ENCOUNTER — Telehealth: Payer: Medicare PPO

## 2019-08-19 DIAGNOSIS — I10 Essential (primary) hypertension: Secondary | ICD-10-CM

## 2019-08-19 DIAGNOSIS — N1832 Chronic kidney disease, stage 3b: Secondary | ICD-10-CM

## 2019-08-19 MED ORDER — CITALOPRAM HYDROBROMIDE 40 MG PO TABS: 40.0000 mg | ORAL_TABLET | Freq: Every day | ORAL | 1 refills | Status: DC

## 2019-08-19 MED ORDER — TRAMADOL-ACETAMINOPHEN 37.5-325 MG PO TABS: 2.0000 | ORAL_TABLET | Freq: Three times a day (TID) | ORAL | 0 refills | Status: DC

## 2019-08-19 NOTE — Chronic Care Management (AMB) (Signed)
  Chronic Care Management   Outreach Note  08/19/2019 Name: Gary [name] MRN: [id number] DOB: 1932/05/19  Referred by: Minette Brine, FNP Reason for referral : Care Coordination   SW placed an unsuccessful outbound call to the patient and his spouse after communication from primary provider requesting SW assistance with resource needs. SW left a HIPAA compliant voice message requesting a return call.   Follow Up Plan: The care management team will reach out to the patient again over the next 21 days.   Daneen Schick, BSW, CDP Social Worker, Certified Dementia Practitioner [place] [facility] [place] [phone]

## 2019-08-20 ENCOUNTER — Ambulatory Visit (INDEPENDENT_AMBULATORY_CARE_PROVIDER_SITE_OTHER): Payer: Medicare PPO

## 2019-08-20 DIAGNOSIS — I10 Essential (primary) hypertension: Secondary | ICD-10-CM

## 2019-08-20 DIAGNOSIS — I7090 Unspecified atherosclerosis: Secondary | ICD-10-CM | POA: Diagnosis not present

## 2019-08-20 DIAGNOSIS — N183 Chronic kidney disease, stage 3 unspecified: Secondary | ICD-10-CM

## 2019-08-20 DIAGNOSIS — N1832 Chronic kidney disease, stage 3b: Secondary | ICD-10-CM

## 2019-08-20 NOTE — Chronic Care Management (AMB) (Signed)
Chronic Care Management    Social Work Follow Up Note  08/20/2019 Name: Gary [name] MRN: [id number] DOB: 02-Aug-1932  ELTON HEID is a 86 y.o. year old male who is a primary care patient of Minette Brine, [place] [place]. The CCM team was consulted for assistance with care coordination.   Review of patient status, including review of consultants reports, other relevant assessments, and collaboration with appropriate care team members and the patient's provider was performed as part of comprehensive patient evaluation and provision of chronic care management services.    SDOH (Social Determinants of Health) assessments performed: No    Outpatient Encounter Medications as of 08/20/2019  Medication Sig  . acetaminophen (TYLENOL) 500 MG tablet Take 1,000 mg by mouth every 8 (eight) hours as needed for fever.  Marland Kitchen amLODipine (NORVASC) 5 MG tablet Take 1 tablet (5 mg total) by mouth daily.  Marland Kitchen aspirin EC 81 MG tablet Take 81 mg by mouth daily.  . blood glucose meter kit and supplies KIT Dispense based on patient and insurance preference. Use up to four times daily as directed. (FOR ICD-9 250.00, 250.01).  . carvedilol (COREG) 3.125 MG tablet Take 1 tablet (3.125 mg total) by mouth 2 (two) times daily with a meal.  . citalopram (CELEXA) 40 MG tablet Take 1 tablet (40 mg total) by mouth daily.  . Cyanocobalamin (VITAMIN B-12 IJ) Inject 1 Dose as directed once a week.  . doxazosin (CARDURA) 4 MG tablet Take 4 mg by mouth daily.   . fluticasone (FLONASE) 50 MCG/ACT nasal spray Place 2 sprays into both nostrils daily.   Marland Kitchen gabapentin (NEURONTIN) 400 MG capsule Take 1 capsules by mouth three times a day  . imiquimod (ALDARA) 5 % cream Apply topically at bedtime. Apply to affected area Monday, weds & friday  . mupirocin ointment (BACTROBAN) 2 % Apply 1 application topically 2 (two) times daily.  . nitroGLYCERIN (NITROSTAT) 0.4 MG SL tablet PLACE 1 TABLET UNDER THE TONGUE EVERY 5 MINUTES AS NEEDED FOR CHEST PAIN FOR  3 DOSES (Patient taking differently: Place 0.4 mg under the tongue every 5 (five) minutes as needed for chest pain. )  . simvastatin (ZOCOR) 40 MG tablet TAKE 1 TABLET BY MOUTH AT BEDTIME (Patient taking differently: Take 40 mg by mouth at bedtime. )  . Tamsulosin HCl (FLOMAX) 0.4 MG CAPS Take 0.4 mg by mouth daily after supper.  . temazepam (RESTORIL) 7.5 MG capsule Take 1 capsule (7.5 mg total) by mouth at bedtime as needed for sleep. (Patient not taking: Reported on 08/09/2019)  . traMADol-acetaminophen (ULTRACET) 37.5-325 MG tablet Take 2 tablets by mouth 3 (three) times daily.  . vitamin B-12 1000 MCG tablet Take 1 tablet (1,000 mcg total) by mouth daily.   No facility-administered encounter medications on file as of 08/20/2019.     Goals Addressed              This Visit's Progress     Patient Stated   .  "I am concerned about not having Tramadol to take for pain" (pt-stated)        CARE PLAN ENTRY (see longitudinal plan of care for additional care plan information)  Current Barriers:  Marland Kitchen Knowledge Deficits related to evaluation and treatment of chronic pain secondary to Shingles related Neuropathic pain . Chronic Disease Management support and education needs related to CKD stage 3, Pulmonary Hypertension, Coronary Atherosclerosis, Abnormal glucose  Nurse Case Manager Clinical Goal(s):  Marland Kitchen Over the next 90 days, patient will work  with the CCM team and PCP to address needs related to evaluation and treatment to improve Self Health management of Shingles related Neuropathic pain   CCM SW Interventions: Completed 08/20/19 with the patients spouse . Successful outbound call placed to Mrs. Pautz in response to a voice message received requesting SW assistance . Mrs. Weidemann reports concern over refilling patient Tramadol and how to obtain refills for future needs . SW reviewed all My Chart message with Mrs Devan and discussed importance of the patient being seen by his primary physician  to determine if an alternative medication would be needed or if a referral to a pain clinic would be appropriate . Discussed opportunity to switch to a PCP who makes in home visits due to patients difficulty in attending several appointments o Advised Mrs. Eastman that an in home PCP may/may not continue current medications  o Determined Mrs. Sand would rather stick with primary provider the patient is already active with . Provided education on opportunities for caregiver resources o Discussed [facility]- family not interested o Educated on [place] in [place] aide program - family not interested due to long waitlist o Discussed options to privately hire a caregiver- family declined due to lack of financial resources . Assisted in scheduling a follow up office visit for the patient on Wednesday July 28 . Encouraged Mrs. Wisnieski to contact the care management team as needed for future care coordination needs . Collaboration with primary care provider to inform of scheduled appointment and plan for SW to sign off due to lack of interest in resources at this time  Patient Self Care Activities:  . Attends all scheduled provider appointments . Calls pharmacy for medication refills . Calls provider office for new concerns or questions . Supportive wife to assist with care needs  Please see past updates related to this goal by clicking on the "Past Updates" button in the selected goal          Follow Up Plan: No SW follow up planned at this time. The patient will remain active with RN Care Manager.   Daneen Schick, BSW, CDP Social Worker, Certified Dementia Practitioner [place] [facility] [place] [facility] [phone]

## 2019-08-20 NOTE — Patient Instructions (Signed)
Social Worker Visit Information  Goals we discussed today:  Goals Addressed              This Visit's Progress     Patient Stated   .  "I am concerned about not having Tramadol to take for pain" (pt-stated)        CARE PLAN ENTRY (see longitudinal plan of care for additional care plan information)  Current Barriers:  Marland Kitchen Knowledge Deficits related to evaluation and treatment of chronic pain secondary to Shingles related Neuropathic pain . Chronic Disease Management support and education needs related to CKD stage 3, Pulmonary Hypertension, Coronary Atherosclerosis, Abnormal glucose  Nurse Case Manager Clinical Goal(s):  Marland Kitchen Over the next 90 days, patient will work with the CCM team and PCP to address needs related to evaluation and treatment to improve Self Health management of Shingles related Neuropathic pain   CCM RN CM Interventions:  . Inter-disciplinary care team collaboration (see longitudinal plan of care) . Evaluation of current treatment plan related to Chronic pain secondary to Shingle related Neuropathic pain and patient's adherence to plan as established by provider . Determined patient was prescribed Tramadol-Acetaminophen 37.5-325 mg tablet, take 2 tablets three times daily for Neuropathic pain secondary to having a severe Shingles outbreak by prior PCP approximately 15 years ago with good effectiveness for this pain . Determined patient's wife attempted to refill this medication and PCP Minette Brine, FNP prescribed a lower dose with a message to have patient come in for an OV for further evaluation and treatment of his pain regimen . Discussed the patient and his wife are concerned the patient will have withdrawal symptoms if not properly titrated to a lower dose, they are also concerned about taking other pain medications not used in the past, they are concerned about not having the patient's Neuropathic pain well controlled if is current pain regimen is changed    . Educated patient and wife on potential benefits of having PCP re-evaluate and discuss further for best treatment options face to face; Educated on importance of consideration of patient's chronic kidney disease for safest pain treatment including dosage and frequency . Scheduled an in office face to face OV with PCP provider Minette Brine, FNP for 08/10/19 @ 2:45 PM and Mrs. Politte will accompanying the patient during this visit  . Collaborated with PCP via in basket message regarding patient/wife concerns regarding the concerns listed above . Discussed plans with patient for ongoing care management follow up and provided patient with direct contact information for care management team  CCM SW Interventions: Completed 08/20/19 with the patients spouse . Successful outbound call placed to Mrs. Hummel in response to a voice message received requesting SW assistance . Mrs. Jackson reports concern over refilling patient Tramadol and how to obtain refills for future needs . SW reviewed all My Chart message with Mrs Bonneau and discussed importance of the patient being seen by his primary physician to determine if an alternative medication would be needed or if a referral to a pain clinic would be appropriate . Discussed opportunity to switch to a PCP who makes in home visits due to patients difficulty in attending several appointments o Advised Mrs. [place] that an in home PCP may/may not continue current medications  o Determined Mrs. Baxley would rather stick with primary provider the patient is already active with . Provided education on opportunities for caregiver resources o Discussed [facility]- family not interested o Educated on [place] in home aide  program - family not interested due to long waitlist o Discussed options to privately hire a caregiver- family declined due to lack of financial resources . Assisted in scheduling a follow up office visit for the patient on Wednesday July  28 . Encouraged Mrs. Grygiel to contact the care management team as needed for future care coordination needs . Collaboration with primary care provider to inform of scheduled appointment and plan for SW to sign off due to lack of interest in resources at this time  Patient Self Care Activities:  . Attends all scheduled provider appointments . Calls pharmacy for medication refills . Calls provider office for new concerns or questions . Supportive wife to assist with care needs  Please see past updates related to this goal by clicking on the "Past Updates" button in the selected goal          Follow Up Plan: No SW follow up planned at this time. The patient will remain active with RN Care Manager.  Daneen Schick, BSW, CDP Social Worker, Certified Dementia Practitioner [place] [facility] [place] [facility] [phone]

## 2019-08-23 ENCOUNTER — Telehealth: Payer: Self-pay

## 2019-08-23 ENCOUNTER — Other Ambulatory Visit: Payer: Self-pay | Admitting: Nurse Practitioner

## 2019-08-23 NOTE — Telephone Encounter (Signed)
Phone call from patient stating that he's just finished his treatment of the imiquimod and he has spots on his hands that are painful and he would like Dr. Denna Haggard to look at those areas soon.  Appointment made with Dr. Denna Haggard on 08/26/2019 @ 4pm.

## 2019-08-26 ENCOUNTER — Other Ambulatory Visit: Payer: Self-pay

## 2019-08-26 ENCOUNTER — Encounter: Payer: Self-pay | Admitting: Dermatology

## 2019-08-26 ENCOUNTER — Ambulatory Visit: Payer: Medicare PPO | Admitting: Dermatology

## 2019-08-26 DIAGNOSIS — L233 Allergic contact dermatitis due to drugs in contact with skin: Secondary | ICD-10-CM

## 2019-08-26 DIAGNOSIS — Z85828 Personal history of other malignant neoplasm of skin: Secondary | ICD-10-CM | POA: Diagnosis not present

## 2019-08-26 DIAGNOSIS — T4995XA Adverse effect of unspecified topical agent, initial encounter: Secondary | ICD-10-CM

## 2019-09-02 ENCOUNTER — Ambulatory Visit: Payer: Medicare PPO | Admitting: Dermatology

## 2019-09-06 ENCOUNTER — Telehealth: Payer: Medicare PPO

## 2019-09-08 ENCOUNTER — Telehealth (INDEPENDENT_AMBULATORY_CARE_PROVIDER_SITE_OTHER): Payer: Medicare PPO | Admitting: Nurse Practitioner

## 2019-09-08 ENCOUNTER — Other Ambulatory Visit: Payer: Self-pay | Admitting: Nephrology

## 2019-09-08 ENCOUNTER — Ambulatory Visit: Payer: Medicare PPO

## 2019-09-08 ENCOUNTER — Other Ambulatory Visit: Payer: Self-pay

## 2019-09-08 ENCOUNTER — Encounter: Payer: Self-pay | Admitting: Nurse Practitioner

## 2019-09-08 DIAGNOSIS — N1832 Chronic kidney disease, stage 3b: Secondary | ICD-10-CM

## 2019-09-08 DIAGNOSIS — G4730 Sleep apnea, unspecified: Secondary | ICD-10-CM | POA: Diagnosis not present

## 2019-09-08 DIAGNOSIS — E538 Deficiency of other specified B group vitamins: Secondary | ICD-10-CM | POA: Diagnosis not present

## 2019-09-08 DIAGNOSIS — R7309 Other abnormal glucose: Secondary | ICD-10-CM

## 2019-09-08 DIAGNOSIS — G8929 Other chronic pain: Secondary | ICD-10-CM

## 2019-09-08 DIAGNOSIS — Z8669 Personal history of other diseases of the nervous system and sense organs: Secondary | ICD-10-CM

## 2019-09-08 DIAGNOSIS — I10 Essential (primary) hypertension: Secondary | ICD-10-CM

## 2019-09-08 MED ORDER — PREGABALIN 75 MG PO CAPS: 75.0000 mg | ORAL_CAPSULE | Freq: Three times a day (TID) | ORAL | 3 refills | Status: DC

## 2019-09-08 NOTE — Progress Notes (Signed)
Virtual Visit via video   This visit type was conducted due to national recommendations for restrictions regarding the COVID-19 Pandemic (e.g. social distancing) in an effort to limit this patient's exposure and mitigate transmission in our community.  Due to his co-morbid illnesses, this patient is at least at moderate risk for complications without adequate follow up.  This format is felt to be most appropriate for this patient at this time.  All issues noted in this document were discussed and addressed.  A limited physical exam was performed with this format.    This visit type was conducted due to national recommendations for restrictions regarding the COVID-19 Pandemic (e.g. social distancing) in an effort to limit this patient's exposure and mitigate transmission in our community.  Patients identity confirmed using two different identifiers.  This format is felt to be most appropriate for this patient at this time.  All issues noted in this document were discussed and addressed.  No physical exam was performed (except for noted visual exam findings with Video Visits).    Date:  09/08/2019   ID:  Gary [name], DOB 1932-12-19, MRN [id number]  Patient Location:  Home spoke with [name] and Jolee Ewing   Provider location:   Office    Chief Complaint:  Follow up for pain   History of Present Illness:    Gary [name] is a 86 y.o. male who presents via video conferencing for a telehealth visit today.    The patient does not have symptoms concerning for COVID-19 infection (fever, chills, cough, or new shortness of breath).   Virtual visit for pain management.  He states "I am not doing to well"   He reports he has skin cancer on his left hand, he is being seen by Dermatology - Dr. Denna Haggard.  Reports has been present for the last 2 years.   He went to see the kidney specialist yesterday.  He reports he was started on Tramadol by Dr. Nyoka Cowden, reports pain with nerve endings  in his left arm. He has had gabapentin as well.  The patient is willing to try to replace the tramadol.  He reports the sharp pain is related having shingles to his left arm.    He is having multiple doctor visits and having difficulty with going to all of them due to not feeling well.     Past Medical History:  Diagnosis Date  . Adenomatous colon polyp    2010   . Anemia   . Anxiety   . Arthritis    "hips and lower back"  . Arthritis pain   . Atypical mole 06/21/2003   left neck, inferior - slight to moderate atypia  . Basal cell carcinoma 10/04/2014   right shin bcc (tx cx3, 37fu )  . Blood transfusion   . Chest pain 03/29/11   2D Echo without contrast on 03/29/11 - EF= 55-60%.  . Cholecystitis   . Chronic back pain greater than 3 months duration   . Chronic kidney disease   . Colon polyps   . Complication of anesthesia    once slow to wake up  . Coronary angioplasty status 2011  . Coronary artery disease   . Diarrhea   . Diverticulosis   . Dyslipidemia   . GERD (gastroesophageal reflux disease)   . GI bleeding after 07/2009   "triggered by Plavix & ASA; from my diverticulosis"  . H/O Clostridium difficile infection   . H/O myocardial perfusion scan 04/04/11  For Pre-noncardiac surgery - EF = 67%, no ischemia, and is considered low risk.  Marland Kitchen Heart attack ([place]) 07/2009   nonSTEMI with an occluded circumflex artery and collaterals.  Marland Kitchen Heart murmur   . Hemorrhoids   . History of shingles   . Hyperlipidemia   . Hypertension   . Kidney stones   . Lower extremity edema    Taking furosemide and it seems to be helping.  . Neuromuscular disorder ([place])   . Nonspecific chest pain 08/26/2012   Went to ED 08/26/12  . Squamous cell carcinoma of skin 05/13/2000   Right outer forehead  . Squamous cell carcinoma of skin 03/02/2001   Post crown - tx 03/19/2001  . Squamous cell carcinoma of skin 09/01/2001   Left post auricular - tx 10/09/2001  . Squamous cell carcinoma of skin  06/21/2003   Top right ear - tx 07/05/2003  . Squamous cell carcinoma of skin 06/21/2003   Right upper arm - tx 07/05/2003  . Squamous cell carcinoma of skin 10/12/2003   Left superior ear lobe - MOHs  . Squamous cell carcinoma of skin 12/15/2006   Right ear, superior - tx 01/26/2007  . Squamous cell carcinoma of skin 03/13/2009   Right temple - tx 05/08/2009  . Squamous cell carcinoma of skin 03/24/2013   Upper left forearm - tx p bx  . Squamous cell carcinoma of skin 12/06/2013   Right forearm - CX3 + 5FU  . Squamous cell carcinoma of skin 02/10/2014   Right hand - tx p bx  . Squamous cell carcinoma of skin 10/04/2014   Right shin - tx p bx  . Squamous cell carcinoma of skin 08/21/2016   Left hand - tx p bx  . Squamous cell carcinoma of skin 01/01/2017   Right hand - tx 02/06/2017  . Squamous cell carcinoma of skin 10/21/2017   Left side of scalp - tx 03/12/2018  . Squamous cell carcinoma of skin 10/21/2017   Left post scalp, inf - tx 08/13/2018  . Squamous cell carcinoma of skin 10/21/2017   Right scalp - tx p bx  . Squamous cell carcinoma of skin 10/21/2017   Left cheek - tx 03/12/2018  . Squamous cell carcinoma of skin 08/13/2018   Left top hand, proximal pinky - tx p bx  . Squamous cell carcinoma of skin 12/23/2018   Right bicep lower - tx p bx  . Squamous cell carcinoma of skin 05/18/2019   Right 4th finger metacarpophalangeal joint  . Squamous cell carcinoma of skin 05/18/2019   Left 5th finger metacarpophalangeal joint  . Squamous cell carcinoma of skin 05/18/2019   Left 2nd finger metacarpophalangeal joint  . Stomach problems   . Syncope and collapse 04/16/2018   Past Surgical History:  Procedure Laterality Date  . BACK SURGERY  1997  . CARDIAC CATHETERIZATION  08/09/2009   Circumflex 100% occluded with right-to-left collaterals, mild irregularities in the proximal and distal LAD and diagonal system, normal LV systolic function, and minor infrarenal  irregularities, but no abdominal aortic aneurysm.  . CHOLECYSTECTOMY  04/12/2011   Procedure: CHOLECYSTECTOMY;  Surgeon: Adin Hector, MD;  Location: [place];  Service: General;  Laterality: N/A;  . CHOLECYSTECTOMY  04/12/2011   Procedure: LAPAROSCOPIC CHOLECYSTECTOMY;  Surgeon: Adin Hector, MD;  Location: [place];  Service: General;  Laterality: N/A;  converted to open  . COLON SURGERY     COLONOSCOPY  . ESOPHAGOGASTRODUODENOSCOPY N/A 08/05/2013   Procedure: ESOPHAGOGASTRODUODENOSCOPY (EGD);  Surgeon: Ulice Dash  Everitt Amber, MD;  Location: [place];  Service: Gastroenterology;  Laterality: N/A;  . EYE SURGERY  1942   "right eye was crossed; they  corrected it"  . LUMBAR FUSION  07/22/2012  . PACEMAKER IMPLANT N/A 04/17/2018   Procedure: PACEMAKER IMPLANT;  Surgeon: Deboraha Sprang, MD;  Location: [facility];  Service: Cardiovascular;  Laterality: N/A;  . POSTERIOR FUSION LUMBAR SPINE  08/1995   "bottom 4 vertebra"  . SUBTOTAL COLECTOMY  01/24/2010  . TOTAL HIP ARTHROPLASTY Right 08/03/2013   Procedure: TOTAL HIP ARTHROPLASTY ANTERIOR APPROACH;  Surgeon: Hessie Dibble, MD;  Location: [facility];  Service: Orthopedics;  Laterality: Right;     No outpatient medications have been marked as taking for the 09/08/19 encounter (Video Visit) with Minette Brine, [place].     Allergies:   Naproxen, Clopidogrel bisulfate, Sulfonamide derivatives, Other, Latex, and Tape   Social History   Tobacco Use  . Smoking status: Never Smoker  . Smokeless tobacco: Never Used  Vaping Use  . Vaping Use: Never used  Substance Use Topics  . Alcohol use: No  . Drug use: No     Family Hx: The patient's family history includes Diverticulosis in his sister; Heart disease in his father; Pancreatic cancer in his mother; Stroke in his father. There is no history of Colon cancer.  ROS:   Please see the history of present illness.    Review of Systems  Constitutional: Negative.  Negative for malaise/fatigue.   Respiratory: Negative.   Cardiovascular: Negative.  Negative for chest pain and palpitations.  Musculoskeletal: Negative.   Skin: Negative for itching and rash.  Neurological: Negative for dizziness and tingling.       Numbness and tingling, sharp pain where he had shingles.  Psychiatric/Behavioral: Negative.  Negative for depression.    All other systems reviewed and are negative.   Labs/Other Tests and Data Reviewed:    Recent Labs: 03/28/2019: TSH 1.817 03/29/2019: Hemoglobin 13.7; Platelets 175 07/14/2019: ALT 13; BUN 20; Creatinine, Ser 1.66; Potassium 4.2; Sodium 140   Recent Lipid Panel Lab Results  Component Value Date/Time   CHOL  08/09/2009 09:49 AM    164        ATP III CLASSIFICATION:  <200     mg/dL   Desirable  200-239  mg/dL   Borderline High  >=240    mg/dL   High          TRIG 114 08/09/2009 09:49 AM   HDL 36 (L) 08/09/2009 09:49 AM   CHOLHDL 4.6 08/09/2009 09:49 AM   LDLCALC (H) 08/09/2009 09:49 AM    105        Total Cholesterol/HDL:CHD Risk Coronary Heart Disease Risk Table                     Men   Women  1/2 Average Risk   3.4   3.3  Average Risk       5.0   4.4  2 X Average Risk   9.6   7.1  3 X Average Risk  23.4   11.0        Use the calculated Patient Ratio above and the CHD Risk Table to determine the patient's CHD Risk.        ATP III CLASSIFICATION (LDL):  <100     mg/dL   Optimal  100-129  mg/dL   Near or Above  Optimal  130-159  mg/dL   Borderline  160-189  mg/dL   High  >190     mg/dL   Very High    Wt Readings from Last 3 Encounters:  07/07/19 204 lb (92.5 kg)  07/07/19 204 lb (92.5 kg)  06/07/19 206 lb (93.4 kg)     Exam:    Vital Signs:  There were no vitals taken for this visit.    Physical Exam Constitutional:      General: He is not in acute distress.    Appearance: Normal appearance. He is obese.  Pulmonary:     Effort: Pulmonary effort is normal. No respiratory distress.     Breath sounds:  No wheezing.  Neurological:     General: No focal deficit present.     Mental Status: He is alert and oriented to person, place, and time.     Cranial Nerves: No cranial nerve deficit.  Psychiatric:        Mood and Affect: Mood normal.        Behavior: Behavior normal.        Thought Content: Thought content normal.        Judgment: Judgment normal.     ASSESSMENT & PLAN:    1. Other chronic pain  He reports having pain to his shingles site  I will try him on Lyrica as we try to transition him off the gabapentin, he will need to decrease the amount slowly since he has been on for quite some time  At this time he is to continue the Tramadol until we can fully transition  I am also going to make a referral to Dr. Albesa Seen who can make house visits this may help with his inability to make the regular PCP appts.  2. Abnormal glucose  Chronic, last HgBa1c was improving  3. Vitamin B12 deficiency  The numbness can be related to low vitamin B12 however he is unable to come to the office for labs.  4. Sleep apnea, unspecified type  He is being managed by Cardiology for his sleep apnea  5. History of neuropathy I am sending lyrica to take the place of gabapentin,  - pregabalin (LYRICA) 75 MG capsule; Take 1 capsule (75 mg total) by mouth 3 (three) times daily.  Dispense: 90 capsule; Refill: 3    COVID-19 Education: The signs and symptoms of COVID-19 were discussed with the patient and how to seek care for testing (follow up with PCP or arrange E-visit).  The importance of social distancing was discussed today.  Patient Risk:   After full review of this patients clinical status, I feel that they are at least moderate risk at this time.  Time:   Today, I have spent 25 minutes/ seconds with the patient with telehealth technology discussing above diagnoses.     Medication Adjustments/Labs and Tests Ordered: Current medicines are reviewed at length with the patient today.   Concerns regarding medicines are outlined above.   Tests Ordered: No orders of the defined types were placed in this encounter.   Medication Changes: Meds ordered this encounter  Medications  . pregabalin (LYRICA) 75 MG capsule    Sig: Take 1 capsule (75 mg total) by mouth 3 (three) times daily.    Dispense:  90 capsule    Refill:  3    Disposition:  Follow up prn  Signed, Minette Brine, FNP

## 2019-09-08 NOTE — Patient Instructions (Signed)
Social Worker Visit Information  Goals we discussed today:  Goals Addressed              This Visit's Progress     Patient Stated     COMPLETED: "I am concerned about not having Tramadol to take for pain" (pt-stated)        CARE PLAN ENTRY (see longitudinal plan of care for additional care plan information)  Current Barriers:   Knowledge Deficits related to evaluation and treatment of chronic pain secondary to Shingles related Neuropathic pain  Chronic Disease Management support and education needs related to CKD stage 3, Pulmonary Hypertension, Coronary Atherosclerosis, Abnormal glucose  Nurse Case Manager Clinical Goal(s):   Over the next 90 days, patient will work with the CCM team and PCP to address needs related to evaluation and treatment to improve Self Health management of Shingles related Neuropathic pain   09/08/19- Collaboration with patient primary provider, Minette Brine FNP, who reports patient will be referred to in home primary care provider, Dr. Albesa Seen. Patient no longer eligible for CCM services  CCM RN CM Interventions:   Inter-disciplinary care team collaboration (see longitudinal plan of care)  Evaluation of current treatment plan related to Chronic pain secondary to Shingle related Neuropathic pain and patient's adherence to plan as established by provider  Determined patient was prescribed Tramadol-Acetaminophen 37.5-325 mg tablet, take 2 tablets three times daily for Neuropathic pain secondary to having a severe Shingles outbreak by prior PCP approximately 15 years ago with good effectiveness for this pain  Determined patient's wife attempted to refill this medication and PCP Minette Brine, FNP prescribed a lower dose with a message to have patient come in for an OV for further evaluation and treatment of his pain regimen  Discussed the patient and his wife are concerned the patient will have withdrawal symptoms if not properly titrated to a lower dose,  they are also concerned about taking other pain medications not used in the past, they are concerned about not having the patient's Neuropathic pain well controlled if is current pain regimen is changed    Educated patient and wife on potential benefits of having PCP re-evaluate and discuss further for best treatment options face to face; Educated on importance of consideration of patient's chronic kidney disease for safest pain treatment including dosage and frequency  Scheduled an in office face to face OV with PCP provider Minette Brine, [place] for 08/10/19 @ 2:45 PM and Mrs. Hoheisel will accompanying the patient during this visit   Collaborated with PCP via in basket message regarding patient/wife concerns regarding the concerns listed above  Discussed plans with patient for ongoing care management follow up and provided patient with direct contact information for care management team  CCM SW Interventions: Completed 08/20/19 with the patients spouse  Successful outbound call placed to Mrs. Lupinski in response to a voice message received requesting SW assistance  Mrs. Zalesky reports concern over refilling patient Tramadol and how to obtain refills for future needs  SW reviewed all My Chart message with Mrs Zabriskie and discussed importance of the patient being seen by his primary physician to determine if an alternative medication would be needed or if a referral to a pain clinic would be appropriate  Discussed opportunity to switch to a PCP who makes in home visits due to patients difficulty in attending several appointments o Advised Mrs. Mccomas that an in home PCP may/may not continue current medications  o Determined Mrs. Gellerman would rather stick with primary  provider the patient is already active with  Provided education on opportunities for caregiver resources o Discussed [facility]- family not interested o Educated on [place] in home aide program - family not interested due  to long waitlist o Discussed options to privately hire a caregiver- family declined due to lack of financial resources  Assisted in scheduling a follow up office visit for the patient on Wednesday July 28  Encouraged Mrs. Feig to contact the care management team as needed for future care coordination needs  Collaboration with primary care provider to inform of scheduled appointment and plan for SW to sign off due to lack of interest in resources at this time  Patient Self Care Activities:   Attends all scheduled provider appointments  Calls pharmacy for medication refills  Calls provider office for new concerns or questions  Supportive wife to assist with care needs  Please see past updates related to this goal by clicking on the "Past Updates" button in the selected goal        COMPLETED: "I am concerned about the skin cancers that keep showing up" (pt-stated)        Tensed (see longitudinal plan of care for additional care plan information)  Current Barriers:   Knowledge Deficits related to disease process and Self Health management of Squamous Cell Carcinoma   Chronic Disease Management support and education needs related to CKD stage 3, Pulmonary Hypertension, Coronary Atherosclerosis, Abnormal glucose  Nurse Case Manager Clinical Goal(s):   Over the next 90 days, patient will work with the CCM team and Dermatologist to address needs related to disease education and support of treatment for Squamous Cell Carcinoma   09/08/19- Collaboration with patient primary provider, Minette Brine FNP, who reports patient will be referred to in home primary care provider, Dr. Albesa Seen. Patient no longer eligible for CCM services  CCM RN CM Interventions:  08/06/19 call completed with patient and wife Paulene Floor care team collaboration (see longitudinal plan of care)  Evaluation of current treatment plan related to Squamous Cell Carcinoma and patient's adherence  to plan as established by provider  Determined patient is established with Dr. Denna Haggard with [facility] with last OV noted on 06/10/19 with the following Assessment/Plan noted:  o Squamous cell carcinoma in situ (3) o  Right Hand - Posterior   o All sites of previous biopsy/curet still will crusting. Cannot rule out residual carcinoma but Mr. Lurz would understandably prefer to avoid more surgery. o   o   o    o  Right Hand - Posterior o    o  Left Hand - Posterior o    o Each spot treated with 15sec LN2 freeze; this will be followed by 20 applications 3x/week imiquimod o   o    Return in about 4 months (around 10/10/2019) for f/u on left hand  Discussed patient and wife verbalize understanding of this treatment plan and recommendations for follow up  Discussed plans with patient for ongoing care management follow up and provided patient with direct contact information for care management team  Patient Self Care Activities:   Attends all scheduled provider appointments  Calls pharmacy for medication refills  Calls provider office for new concerns or questions  Supportive wife to assist with care needs   Initial goal documentation       COMPLETED: "I need someone to help me check my blood sugars" (pt-stated)        CARE  PLAN ENTRY (see longtitudinal plan of care for additional care plan information)  Current Barriers:   Knowledge Deficits related to disease process and Self Health management of Abnormal glucose  Lacks caregiver support.   Chronic Disease Management support and education needs related to CKD stage 3, Abnormal glucose, Pulmonary Hypertension, coronary Atherosclerosis  Nurse Case Manager Clinical Goal(s):   Over the next 90 days, patient will work with the CCM RN and PCP to address needs related to disease education and support to improve Self management of DM and lower A1c <7.0  09/08/19- Collaboration with patient primary provider, Minette Brine FNP, who reports patient will be referred to in home primary care provider, Dr. Albesa Seen. Patient no longer eligible for CCM services  CCM RN CM Interventions:  08/06/19 call completed with patient and wife   Evaluation of current treatment plan related to Abnormal glucose and patient's adherence to plan as established by provider  Reviewed and discussed patients current A1c has decreased from 7.0 from 7.4%; Reiterated target goal A1c <7.0 and how to achieve and maintain this goal with medication adherence, ADA diet adherence, exercise with 150 minutes per week recommended; achieving/maintaining healthy weight  Determined wife is now able to check CBG's since changing to new glucose pen with lancet; Reviewed and discussed recorded FBS and Reiterated target daily glycemic ranges FBS 80-130 and <180 after meals; Educated on 15"15" rule  Discussed plans with patient for ongoing care management follow up and provided patient with direct contact information for care management team  Provided patient with printed educational materials related to Diabetes Care Schedule; Prevent Complications: Living with Diabetes; Grocery Shopping with Diabetes  Patient Self Care Activities:   Self administers medications as prescribed  Attends all scheduled provider appointments  Calls pharmacy for medication refills  Calls provider office for new concerns or questions  Unable to independently Self check CBG's  Please see past updates related to this goal by clicking on the "Past Updates" button in the selected goal        COMPLETED: "I would like to try to drnk more water to help my kidneys" (pt-stated)        CARE PLAN ENTRY (see longtitudinal plan of care for additional care plan information)  Current Barriers:   Knowledge Deficits related to disease process and Self Health management of CKD  Lacks caregiver support.   Chronic Disease Management support and education needs related to CKD  stage 3, Abnormal glucose, Pulmonary hypertension, Coronary Atherosclerosis   Nurse Case Manager Clinical Goal(s):   Over the next 90 days, patient will work with the CCM RN and PCP to address needs related to disease education and support to improve Self Health management of CKD   09/08/19- Collaboration with patient primary provider, Minette Brine FNP, who reports patient will be referred to in home primary care provider, Dr. Albesa Seen. Patient no longer eligible for CCM services  CCM RN CM Interventions:  08/06/19 call completed with patient   Evaluation of current treatment plan related to CKD and patient's adherence to plan as established by provider.  Provided education to patient re: stages of CKD; reviewed and discussed patient's current GFR 22; Educated on disease process and ways to improve kidney function by lowering his A1c, increasing exercise activity, drinking plenty of water, maintaining a healthy weight  Discussed plans with patient for ongoing care management follow up and provided patient with direct contact information for care management team  Provided patient with printed educational materials related to  Diabetes and Kidney disease: What to Eat; The Best Salt Substitutes for Kidney patients  Determined patient has a Nephrology appointment scheduled with [facility] on August 13 th with Dr. Justin Mend for evaluation and treatment recommendations for CKD   Patient Self Care Activities:   Self administers medications as prescribed  Attends all scheduled provider appointments  Calls pharmacy for medication refills  Calls provider office for new concerns or questions  Unable to independently check blood glucose levels at home due to poor dexterity in his hands  Please see past updates related to this goal by clicking on the "Past Updates" button in the selected goal          Follow Up Plan: No care management follow up planned as the patient is no longer eligible for  the program.   Daneen Schick, BSW, CDP Social Worker, Certified Dementia Practitioner [place] [facility] [place] [facility] [phone]

## 2019-09-08 NOTE — Chronic Care Management (AMB) (Signed)
Chronic Care Management    Social Work Follow Up Note  09/08/2019 Name: Gary [name] MRN: [id number] DOB: 1933-01-02  Gary [name] is a 86 y.o. year old male who is a primary care patient of Gary [name], [place]. The CCM team was consulted for assistance with care coordination.   Review of patient status, including review of consultants reports, other relevant assessments, and collaboration with appropriate care team members and the patient's provider was performed as part of comprehensive patient evaluation and provision of chronic care management services.    SW collaboration with patient primary provider, Gary Brine, FNP who reports patient will be referred to Dr. Albesa [name] an in home primary care provider to address patient primary care needs.    Outpatient Encounter Medications as of 09/08/2019  Medication Sig  . acetaminophen (TYLENOL) 500 MG tablet Take 1,000 mg by mouth every 8 (eight) hours as needed for fever.  Marland Kitchen amLODipine (NORVASC) 5 MG tablet Take 1 tablet (5 mg total) by mouth daily.  Marland Kitchen aspirin EC 81 MG tablet Take 81 mg by mouth daily.  . blood glucose meter kit and supplies KIT Dispense based on patient and insurance preference. Use up to four times daily as directed. (FOR ICD-9 250.00, 250.01).  . carvedilol (COREG) 3.125 MG tablet Take 1 tablet (3.125 mg total) by mouth 2 (two) times daily with a meal.  . citalopram (CELEXA) 40 MG tablet Take 1 tablet (40 mg total) by mouth daily.  . Cyanocobalamin (VITAMIN B-12 IJ) Inject 1 Dose as directed once a week.  . doxazosin (CARDURA) 4 MG tablet Take 4 mg by mouth daily.   . fluticasone (FLONASE) 50 MCG/ACT nasal spray Place 2 sprays into both nostrils daily.   Marland Kitchen gabapentin (NEURONTIN) 400 MG capsule Take 1 capsules by mouth three times a day  . imiquimod (ALDARA) 5 % cream Apply topically at bedtime. Apply to affected area Monday, weds & friday  . mupirocin ointment (BACTROBAN) 2 % Apply 1 application topically 2 (two) times  daily.  . nitroGLYCERIN (NITROSTAT) 0.4 MG SL tablet PLACE 1 TABLET UNDER THE TONGUE EVERY 5 MINUTES AS NEEDED FOR CHEST PAIN FOR 3 DOSES (Patient taking differently: Place 0.4 mg under the tongue every 5 (five) minutes as needed for chest pain. )  . pregabalin (LYRICA) 75 MG capsule Take 1 capsule (75 mg total) by mouth 3 (three) times daily.  . simvastatin (ZOCOR) 40 MG tablet TAKE 1 TABLET BY MOUTH AT BEDTIME (Patient taking differently: Take 40 mg by mouth at bedtime. )  . Tamsulosin HCl (FLOMAX) 0.4 MG CAPS Take 0.4 mg by mouth daily after supper.  . temazepam (RESTORIL) 7.5 MG capsule Take 1 capsule (7.5 mg total) by mouth at bedtime as needed for sleep.  . traMADol-acetaminophen (ULTRACET) 37.5-325 MG tablet Take 2 tablets by mouth 3 (three) times daily.  . vitamin B-12 1000 MCG tablet Take 1 tablet (1,000 mcg total) by mouth daily.   No facility-administered encounter medications on file as of 09/08/2019.     Goals Addressed              This Visit's Progress     Patient Stated   .  COMPLETED: "I am concerned about not having Tramadol to take for pain" (pt-stated)        CARE PLAN ENTRY (see longitudinal plan of care for additional care plan information)  Current Barriers:  Marland Kitchen Knowledge Deficits related to evaluation and treatment of chronic pain secondary to Shingles related Neuropathic  pain . Chronic Disease Management support and education needs related to CKD stage 3, Pulmonary Hypertension, Coronary Atherosclerosis, Abnormal glucose  Nurse Case Manager Clinical Goal(s):  Marland Kitchen Over the next 90 days, patient will work with the CCM team and PCP to address needs related to evaluation and treatment to improve Self Health management of Shingles related Neuropathic pain   09/08/19- Collaboration with patient primary provider, Gary Brine FNP, who reports patient will be referred to in home primary care provider, Dr. Albesa [name]. Patient no longer eligible for CCM services  CCM RN CM  Interventions:  . Inter-disciplinary care team collaboration (see longitudinal plan of care) . Evaluation of current treatment plan related to Chronic pain secondary to Shingle related Neuropathic pain and patient's adherence to plan as established by provider . Determined patient was prescribed Tramadol-Acetaminophen 37.5-325 mg tablet, take 2 tablets three times daily for Neuropathic pain secondary to having a severe Shingles outbreak by prior PCP approximately 15 years ago with good effectiveness for this pain . Determined patient's wife attempted to refill this medication and PCP Gary Brine, FNP prescribed a lower dose with a message to have patient come in for an OV for further evaluation and treatment of his pain regimen . Discussed the patient and his wife are concerned the patient will have withdrawal symptoms if not properly titrated to a lower dose, they are also concerned about taking other pain medications not used in the past, they are concerned about not having the patient's Neuropathic pain well controlled if is current pain regimen is changed   . Educated patient and wife on potential benefits of having PCP re-evaluate and discuss further for best treatment options face to face; Educated on importance of consideration of patient's chronic kidney disease for safest pain treatment including dosage and frequency . Scheduled an in office face to face OV with PCP provider Gary Brine, FNP for 08/10/19 @ 2:45 PM and Gary [name] will accompanying the patient during this visit  . Collaborated with PCP via in basket message regarding patient/wife concerns regarding the concerns listed above . Discussed plans with patient for ongoing care management follow up and provided patient with direct contact information for care management team  CCM SW Interventions: Completed 08/20/19 with the patients spouse . Successful outbound call placed to Gary [name] in response to a voice message received  requesting SW assistance . Gary [name] reports concern over refilling patient Tramadol and how to obtain refills for future needs . SW reviewed all My Chart message with Gary [name] and discussed importance of the patient being [name] by his primary physician to determine if an alternative medication would be needed or if a referral to a pain clinic would be appropriate . Discussed opportunity to switch to a PCP who makes in home visits due to patients difficulty in attending several appointments o Advised Gary [name] that an in home PCP may/may not continue current medications  o Determined Gary [name] would rather stick with primary provider the patient is already active with . Provided education on opportunities for caregiver resources o Discussed [facility]- family not interested o Educated on [place] in home aide program - family not interested due to long waitlist o Discussed options to privately hire a caregiver- family declined due to lack of financial resources . Assisted in scheduling a follow up office visit for the patient on Wednesday July 28 . Encouraged Gary [name] to contact the care management team as needed for future care coordination needs .  Collaboration with primary care provider to inform of scheduled appointment and plan for SW to sign off due to lack of interest in resources at this time  Patient Self Care Activities:  . Attends all scheduled provider appointments . Calls pharmacy for medication refills . Calls provider office for new concerns or questions . Supportive wife to assist with care needs  Please see past updates related to this goal by clicking on the "Past Updates" button in the selected goal      .  COMPLETED: "I am concerned about the skin cancers that keep showing up" (pt-stated)        Gary [name] (see longitudinal plan of care for additional care plan information)  Current Barriers:  Marland Kitchen Knowledge Deficits related to disease process  and Self Health management of Squamous Cell Carcinoma  . Chronic Disease Management support and education needs related to CKD stage 3, Pulmonary Hypertension, Coronary Atherosclerosis, Abnormal glucose  Nurse Case Manager Clinical Goal(s):  Marland Kitchen Over the next 90 days, patient will work with the CCM team and Dermatologist to address needs related to disease education and support of treatment for Squamous Cell Carcinoma   09/08/19- Collaboration with patient primary provider, Gary Brine FNP, who reports patient will be referred to in home primary care provider, Dr. Albesa [name]. Patient no longer eligible for CCM services  CCM RN CM Interventions:  08/06/19 call completed with patient and wife Gary [name]  . Inter-disciplinary care team collaboration (see longitudinal plan of care) . Evaluation of current treatment plan related to Squamous Cell Carcinoma and patient's adherence to plan as established by provider . Determined patient is established with Gary [name] with [facility] with last OV noted on 06/10/19 with the following Assessment/Plan noted:  o Squamous cell carcinoma in situ (3) o  Right Hand - Posterior   o All sites of previous biopsy/curet still will crusting. Cannot rule out residual carcinoma but Mr. Kraeger would understandably prefer to avoid more surgery. o   o   o    o  Right Hand - Posterior o    o  Left Hand - Posterior o    o Each spot treated with 15sec LN2 freeze; this will be followed by 20 applications 3x/week imiquimod o   o    Return in about 4 months (around 10/10/2019) for f/u on left hand . Discussed patient and wife verbalize understanding of this treatment plan and recommendations for follow up . Discussed plans with patient for ongoing care management follow up and provided patient with direct contact information for care management team  Patient Self Care Activities:  . Attends all scheduled provider appointments . Calls pharmacy for medication  refills . Calls provider office for new concerns or questions . Supportive wife to assist with care needs   Initial goal documentation     .  COMPLETED: "I need someone to help me check my blood sugars" (pt-stated)        CARE PLAN ENTRY (see longtitudinal plan of care for additional care plan information)  Current Barriers:  Marland Kitchen Knowledge Deficits related to disease process and Self Health management of Abnormal glucose . Lacks caregiver support.  . Chronic Disease Management support and education needs related to CKD stage 3, Abnormal glucose, Pulmonary Hypertension, coronary Atherosclerosis  Nurse Case Manager Clinical Goal(s):  Marland Kitchen Over the next 90 days, patient will work with the [place] and PCP to address needs related to disease education and support to improve Self management of DM  and lower A1c <7.0  09/08/19- Collaboration with patient primary provider, Gary Brine FNP, who reports patient will be referred to in home primary care provider, Dr. Albesa [name]. Patient no longer eligible for CCM services  CCM RN CM Interventions:  08/06/19 call completed with patient and wife  . Evaluation of current treatment plan related to Abnormal glucose and patient's adherence to plan as established by provider . Reviewed and discussed patients current A1c has decreased from 7.0 from 7.4%; Reiterated target goal A1c <7.0 and how to achieve and maintain this goal with medication adherence, ADA diet adherence, exercise with 150 minutes per week recommended; achieving/maintaining healthy weight . Determined wife is now able to check CBG's since changing to new glucose pen with lancet; Reviewed and discussed recorded FBS and Reiterated target daily glycemic ranges FBS 80-130 and <180 after meals; Educated on 15"15" rule . Discussed plans with patient for ongoing care management follow up and provided patient with direct contact information for care management team . Provided patient with printed educational  materials related to Diabetes Care Schedule; Prevent Complications: Living with Diabetes; Grocery Shopping with Diabetes  Patient Self Care Activities:  . Self administers medications as prescribed . Attends all scheduled provider appointments . Calls pharmacy for medication refills . Calls provider office for new concerns or questions . Unable to independently Self check CBG's  Please see past updates related to this goal by clicking on the "Past Updates" button in the selected goal      .  COMPLETED: "I would like to try to drnk more water to help my kidneys" (pt-stated)        CARE PLAN ENTRY (see longtitudinal plan of care for additional care plan information)  Current Barriers:  Marland Kitchen Knowledge Deficits related to disease process and Self Health management of CKD . Lacks caregiver support.  . Chronic Disease Management support and education needs related to CKD stage 3, Abnormal glucose, Pulmonary hypertension, Coronary Atherosclerosis   Nurse Case Manager Clinical Goal(s):  Marland Kitchen Over the next 90 days, patient will work with the [place] and PCP to address needs related to disease education and support to improve Self Health management of CKD   09/08/19- Collaboration with patient primary provider, Gary Brine FNP, who reports patient will be referred to in home primary care provider, Dr. Albesa [name]. Patient no longer eligible for CCM services  CCM RN CM Interventions:  08/06/19 call completed with patient  . Evaluation of current treatment plan related to CKD and patient's adherence to plan as established by provider. . Provided education to patient re: stages of CKD; reviewed and discussed patient's current GFR 1; Educated on disease process and ways to improve kidney function by lowering his A1c, increasing exercise activity, drinking plenty of water, maintaining a healthy weight . Discussed plans with patient for ongoing care management follow up and provided patient with direct contact  information for care management team . Provided patient with printed educational materials related to Diabetes and Kidney disease: What to Eat; The Best Salt Substitutes for Kidney patients . Determined patient has a Nephrology appointment scheduled with [facility] on August 13 th with Gary [name] for evaluation and treatment recommendations for CKD   Patient Self Care Activities:  . Self administers medications as prescribed . Attends all scheduled provider appointments . Calls pharmacy for medication refills . Calls provider office for new concerns or questions . Unable to independently check blood glucose levels at home due to poor dexterity in his hands  Please see past updates related to this goal by clicking on the "Past Updates" button in the selected goal          Follow Up Plan: Collaboration with [facility] to inform of patient program closure due to switching primary care providers. No embedded care management follow up planned at this time as the patient is no longer eligible for the program.   Daneen Schick, BSW, CDP Social Worker, Certified Dementia Practitioner [place] [facility] [place] [facility] [phone]

## 2019-09-10 NOTE — Telephone Encounter (Signed)
Returned call to patient's wife She is concerned about his BP being low on Mon, Wed, Fri when he takes lasix BP trends mid 100s/mid 60s on lasix days or the subsequent day She reports he gets weak with the low BP, no reported episodes of syncope or presyncope BP generally runs ([id number] over (diastolic) 48-27 on non-lasix days Patient also has kidney disease & diabetes Wife states he was told it would be "good if he didn't take lasix" by nephrologist   Advised patient should continue to monitor BP and any symptoms. BP readings provided wouldn't warrant a med change unless consistently under 100 and/or symptomatic  Send to MD as Juluis Rainier

## 2019-09-13 NOTE — Telephone Encounter (Signed)
Croitoru, Dani Gobble, MD  You; Debara Pickett, Nadean Corwin, MD 1 hour ago (1:24 PM)   That is a tiny dose of furosemide. If he does not have complaints of edema and shortness of breath, it is okay to try to stay off of it and see if that makes him feel better.  Please record daily weights and notify us if he gains more than 3 pounds over 24 hours or if he ever gains more than 5 pounds from today's weight. Call us promptly if he develops shortness of breath or edema.   Message text    Sent to patient via MyChart message

## 2019-09-15 ENCOUNTER — Ambulatory Visit: Payer: Medicare PPO | Admitting: Dermatology

## 2019-09-15 ENCOUNTER — Other Ambulatory Visit: Payer: Self-pay

## 2019-09-15 ENCOUNTER — Encounter: Payer: Self-pay | Admitting: Dermatology

## 2019-09-15 DIAGNOSIS — C44629 Squamous cell carcinoma of skin of left upper limb, including shoulder: Secondary | ICD-10-CM

## 2019-09-15 DIAGNOSIS — D485 Neoplasm of uncertain behavior of skin: Secondary | ICD-10-CM

## 2019-09-15 NOTE — Patient Instructions (Signed)

## 2019-09-16 ENCOUNTER — Other Ambulatory Visit: Payer: Medicare PPO

## 2019-09-16 ENCOUNTER — Other Ambulatory Visit: Payer: Self-pay | Admitting: Nurse Practitioner

## 2019-09-16 NOTE — Telephone Encounter (Signed)
Tramadol refill

## 2019-09-20 ENCOUNTER — Telehealth: Payer: Self-pay

## 2019-09-21 ENCOUNTER — Telehealth: Payer: Self-pay

## 2019-09-21 NOTE — Telephone Encounter (Signed)
Patient needs mohs

## 2019-09-21 NOTE — Telephone Encounter (Signed)
-----   Message from Lavonna Monarch, MD sent at 09/20/2019  8:54 PM EDT ----- Schedule Mohs

## 2019-09-22 NOTE — Telephone Encounter (Signed)
-----   Message from Lavonna Monarch, MD sent at 09/20/2019  8:54 PM EDT ----- Schedule Mohs

## 2019-09-22 NOTE — Telephone Encounter (Signed)
REFERRAL SENT TO [facility]

## 2019-09-24 ENCOUNTER — Encounter: Payer: Self-pay | Admitting: Dermatology

## 2019-09-24 NOTE — Progress Notes (Signed)
   Follow-Up Visit   Subjective  Gary [name] is a 86 y.o. male who presents for the following: Follow-up (imiquimod treatment on hand).  SCCA Location: Left hand Duration:  Quality: Moderately intense inflammation Associated Signs/Symptoms: Modifying Factors: Aldara application  Location Severity:  Timing: Context:   Objective  Well appearing patient in no apparent distress; mood and affect are within normal limits.  A focused examination was performed including Head, neck, arms, hands.. Relevant physical exam findings are noted in the Assessment and Plan.   Assessment & Plan    Personal history of skin cancer Left Dorsal Hand  Discontinue Imiquimod cream.  Start Bryhali lotion - samples given - Lot #[id number] Exp 02/2020  Recheck on follow up. May plan biopsy if persistent.  Allergic contact dermatitis due to drugs in contact with skin Left Hand - Posterior  Continue Aldara.  Apply topical halobetasol twice daily for the next 3 weeks.  Return for possible rebiopsy if residual nodule.  May necessitate referral for Mohs surgery.     I, Lavonna Monarch, MD, have reviewed all documentation for this visit.  The documentation on 09/24/19 for the exam, diagnosis, procedures, and orders are all accurate and complete.

## 2019-09-29 ENCOUNTER — Other Ambulatory Visit: Payer: Medicare PPO

## 2019-10-01 ENCOUNTER — Ambulatory Visit (INDEPENDENT_AMBULATORY_CARE_PROVIDER_SITE_OTHER): Payer: Medicare PPO | Admitting: *Deleted

## 2019-10-01 DIAGNOSIS — I442 Atrioventricular block, complete: Secondary | ICD-10-CM

## 2019-10-01 LAB — CUP PACEART REMOTE DEVICE CHECK
Battery Remaining Longevity: 142 mo
Battery Voltage: 3.03 V
Brady Statistic AP VP Percent: 33.07 %
Brady Statistic AP VS Percent: 0.01 %
Brady Statistic AS VP Percent: 66.85 %
Brady Statistic AS VS Percent: 0.07 %
Brady Statistic RA Percent Paced: 33.06 %
Brady Statistic RV Percent Paced: 99.92 %
Date Time Interrogation Session: 20210820065636
Implantable Lead Implant Date: 20200306
Implantable Lead Implant Date: 20200306
Implantable Lead Location: 753859
Implantable Lead Location: 753860
Implantable Lead Model: 5076
Implantable Lead Model: 5076
Implantable Pulse Generator Implant Date: 20200306
Lead Channel Impedance Value: 304 Ohm
Lead Channel Impedance Value: 342 Ohm
Lead Channel Impedance Value: 361 Ohm
Lead Channel Impedance Value: 494 Ohm
Lead Channel Pacing Threshold Amplitude: 0.375 V
Lead Channel Pacing Threshold Amplitude: 0.625 V
Lead Channel Pacing Threshold Pulse Width: 0.4 ms
Lead Channel Pacing Threshold Pulse Width: 0.4 ms
Lead Channel Sensing Intrinsic Amplitude: 0.75 mV
Lead Channel Sensing Intrinsic Amplitude: 0.75 mV
Lead Channel Sensing Intrinsic Amplitude: 9.875 mV
Lead Channel Sensing Intrinsic Amplitude: 9.875 mV
Lead Channel Setting Pacing Amplitude: 1.5 V
Lead Channel Setting Pacing Amplitude: 2.5 V
Lead Channel Setting Pacing Pulse Width: 0.4 ms
Lead Channel Setting Sensing Sensitivity: 0.6 mV

## 2019-10-04 ENCOUNTER — Encounter: Payer: Self-pay | Admitting: Nurse Practitioner

## 2019-10-04 NOTE — Progress Notes (Signed)
Remote pacemaker transmission.   

## 2019-10-11 ENCOUNTER — Ambulatory Visit: Payer: Medicare PPO | Admitting: Dermatology

## 2019-10-21 ENCOUNTER — Encounter: Payer: Self-pay | Admitting: Nurse Practitioner

## 2019-10-26 ENCOUNTER — Ambulatory Visit: Payer: Medicare PPO | Admitting: Internal Medicine

## 2019-10-26 ENCOUNTER — Ambulatory Visit: Payer: Self-pay | Admitting: Nurse Practitioner

## 2019-10-27 NOTE — Progress Notes (Deleted)
Cardiology Office Note   Date:  10/27/2019   ID:  Gary [name], DOB 1932/07/18, MRN [id number]  PCP:  Clovia Cuff, MD  Cardiologist:  Pixie Casino, MD EP: Virl Axe, MD  No chief complaint on file.     History of Present Illness: PER BEAGLEY is a 86 y.o. male with PMH of CAD s/p NSTEMI in 2011 with occluded LCx with collaterals (medically managed), CHB s/p medtronic PPM, HTN, HLD, ?PAF in the setting of sepsis, OSA, and CKD stage 3, who presents for ***  He was last evaluated by cardiology via a video visit with Dr. Debara Pickett 04/20/19 at which time he was doing well with improvement in BP/HR after starting him on carvedilol in addition to amlodipine.  He underwent a NST 03/2019 which showed evidence of prior MI but no reversible ischemia. EF was noted to be 44% at that time. He has no known history of CHF. Last echo 04/2019 showed EF 55-60%, G1DD, no RWMA, moderate LAE, mild MR/MS, moderate MAC/aortic calcifications, and mild-moderate ascending aortic aneurysm of 40m (up from 355m3/2020).    1. CAD s/p NSTEMI in 2011 with occluded LCx with collaterals (medically managed): NST 03/2019 with fixed defects c/w prior MI. EF noted to be 44% though he has no history of LV systolic dysfunction. Echo 04/2019 showed EF 55-60%, G1DD, and no RWMA. - Continue aspirin and statin  - Continue carvedilol and amlodipine for antianginal effect   2. HTN: BP *** today - Continue amlodipine, carvedilol, lasix, and doxazosin   3. Aortic aneurysm: mild-moderate aortic root aneurysm on echo 04/2019 (4276mup from 69m91m2020) -Continue BP management as above - Plan for repeat echo 04/2020  4. HLD: no recent lipids  - Continue simvastatin   4. CHB s/p PPM: device functioning normally on last check 09/2019 - Continue routine monitoring    5. AoCKD: Cr at baseline of 1.6 07/2019 - Continue routine monitoring and avoidance of nephrotoxic agents.    Past Medical History:  Diagnosis Date  .  Adenomatous colon polyp    2010   . Anemia   . Anxiety   . Arthritis    "hips and lower back"  . Arthritis pain   . Atypical mole 06/21/2003   left neck, inferior - slight to moderate atypia  . Basal cell carcinoma 10/04/2014   right shin bcc (tx cx3, 5fu 61f. Blood transfusion   . Chest pain 03/29/11   2D Echo without contrast on 03/29/11 - EF= 55-60%.  . Cholecystitis   . Chronic back pain greater than 3 months duration   . Chronic kidney disease   . Colon polyps   . Complication of anesthesia    once slow to wake up  . Coronary angioplasty status 2011  . Coronary artery disease   . Diarrhea   . Diverticulosis   . Dyslipidemia   . GERD (gastroesophageal reflux disease)   . GI bleeding after 07/2009   "triggered by Plavix & ASA; from my diverticulosis"  . H/O Clostridium difficile infection   . H/O myocardial perfusion scan 04/04/11   For Pre-noncardiac surgery - EF = 67%, no ischemia, and is considered low risk.  . HeaMarland Kitchent attack (HCC) [place]   nonSTEMI with an occluded circumflex artery and collaterals.  . HeaMarland Kitchent murmur   . Hemorrhoids   . History of shingles   . Hyperlipidemia   . Hypertension   . Kidney stones   . Lower extremity edema  Taking furosemide and it seems to be helping.  . Neuromuscular disorder ([place])   . Nonspecific chest pain 08/26/2012   Went to ED 08/26/12  . Squamous cell carcinoma of skin 05/13/2000   Right outer forehead  . Squamous cell carcinoma of skin 03/02/2001   Post crown - tx 03/19/2001  . Squamous cell carcinoma of skin 09/01/2001   Left post auricular - tx 10/09/2001  . Squamous cell carcinoma of skin 06/21/2003   Top right ear - tx 07/05/2003  . Squamous cell carcinoma of skin 06/21/2003   Right upper arm - tx 07/05/2003  . Squamous cell carcinoma of skin 10/12/2003   Left superior ear lobe - MOHs  . Squamous cell carcinoma of skin 12/15/2006   Right ear, superior - tx 01/26/2007  . Squamous cell carcinoma of skin 03/13/2009    Right temple - tx 05/08/2009  . Squamous cell carcinoma of skin 03/24/2013   Upper left forearm - tx p bx  . Squamous cell carcinoma of skin 12/06/2013   Right forearm - CX3 + 5FU  . Squamous cell carcinoma of skin 02/10/2014   Right hand - tx p bx  . Squamous cell carcinoma of skin 10/04/2014   Right shin - tx p bx  . Squamous cell carcinoma of skin 08/21/2016   Left hand - tx p bx  . Squamous cell carcinoma of skin 01/01/2017   Right hand - tx 02/06/2017  . Squamous cell carcinoma of skin 10/21/2017   Left side of scalp - tx 03/12/2018  . Squamous cell carcinoma of skin 10/21/2017   Left post scalp, inf - tx 08/13/2018  . Squamous cell carcinoma of skin 10/21/2017   Right scalp - tx p bx  . Squamous cell carcinoma of skin 10/21/2017   Left cheek - tx 03/12/2018  . Squamous cell carcinoma of skin 08/13/2018   Left top hand, proximal pinky - tx p bx  . Squamous cell carcinoma of skin 12/23/2018   Right bicep lower - tx p bx  . Squamous cell carcinoma of skin 05/18/2019   Right 4th finger metacarpophalangeal joint  . Squamous cell carcinoma of skin 05/18/2019   Left 5th finger metacarpophalangeal joint  . Squamous cell carcinoma of skin 05/18/2019   Left 2nd finger metacarpophalangeal joint  . Stomach problems   . Syncope and collapse 04/16/2018    Past Surgical History:  Procedure Laterality Date  . BACK SURGERY  1997  . CARDIAC CATHETERIZATION  08/09/2009   Circumflex 100% occluded with right-to-left collaterals, mild irregularities in the proximal and distal LAD and diagonal system, normal LV systolic function, and minor infrarenal irregularities, but no abdominal aortic aneurysm.  . CHOLECYSTECTOMY  04/12/2011   Procedure: CHOLECYSTECTOMY;  Surgeon: Adin Hector, MD;  Location: [place];  Service: General;  Laterality: N/A;  . CHOLECYSTECTOMY  04/12/2011   Procedure: LAPAROSCOPIC CHOLECYSTECTOMY;  Surgeon: Adin Hector, MD;  Location: [place];  Service: General;   Laterality: N/A;  converted to open  . COLON SURGERY     COLONOSCOPY  . ESOPHAGOGASTRODUODENOSCOPY N/A 08/05/2013   Procedure: ESOPHAGOGASTRODUODENOSCOPY (EGD);  Surgeon: Jerene Bears, MD;  Location: [place];  Service: Gastroenterology;  Laterality: N/A;  . EYE SURGERY  1942   "right eye was crossed; they  corrected it"  . LUMBAR FUSION  07/22/2012  . PACEMAKER IMPLANT N/A 04/17/2018   Procedure: PACEMAKER IMPLANT;  Surgeon: Deboraha Sprang, MD;  Location: [facility];  Service: Cardiovascular;  Laterality: N/A;  .  POSTERIOR FUSION LUMBAR SPINE  08/1995   "bottom 4 vertebra"  . SUBTOTAL COLECTOMY  01/24/2010  . TOTAL HIP ARTHROPLASTY Right 08/03/2013   Procedure: TOTAL HIP ARTHROPLASTY ANTERIOR APPROACH;  Surgeon: Hessie Dibble, MD;  Location: [place];  Service: Orthopedics;  Laterality: Right;     Current Outpatient Medications  Medication Sig Dispense Refill  . acetaminophen (TYLENOL) 500 MG tablet Take 1,000 mg by mouth every 8 (eight) hours as needed for fever.    Marland Kitchen amLODipine (NORVASC) 5 MG tablet Take 1 tablet (5 mg total) by mouth daily. 90 tablet 3  . aspirin EC 81 MG tablet Take 81 mg by mouth daily.    . blood glucose meter kit and supplies KIT Dispense based on patient and insurance preference. Use up to four times daily as directed. (FOR ICD-9 250.00, 250.01). 1 each 0  . carvedilol (COREG) 3.125 MG tablet Take 1 tablet (3.125 mg total) by mouth 2 (two) times daily with a meal. 180 tablet 3  . citalopram (CELEXA) 40 MG tablet Take 1 tablet (40 mg total) by mouth daily. 90 tablet 1  . Cyanocobalamin (VITAMIN B-12 IJ) Inject 1 Dose as directed once a week.    . doxazosin (CARDURA) 4 MG tablet Take 4 mg by mouth daily.     . fluticasone (FLONASE) 50 MCG/ACT nasal spray Place 2 sprays into both nostrils daily.     . furosemide (LASIX) 20 MG tablet Take 20 mg by mouth. Monday,wednesday and friday    . gabapentin (NEURONTIN) 400 MG capsule Take 1 capsules by mouth three  times a day 270 capsule 1  . imiquimod (ALDARA) 5 % cream Apply topically at bedtime. Apply to affected area Monday, weds & friday 24 each 1  . mupirocin ointment (BACTROBAN) 2 % Apply 1 application topically 2 (two) times daily. 22 g 3  . nitroGLYCERIN (NITROSTAT) 0.4 MG SL tablet PLACE 1 TABLET UNDER THE TONGUE EVERY 5 MINUTES AS NEEDED FOR CHEST PAIN FOR 3 DOSES (Patient taking differently: Place 0.4 mg under the tongue every 5 (five) minutes as needed for chest pain. ) 25 tablet 0  . pregabalin (LYRICA) 75 MG capsule Take 1 capsule (75 mg total) by mouth 3 (three) times daily. 90 capsule 3  . simvastatin (ZOCOR) 40 MG tablet TAKE 1 TABLET BY MOUTH AT BEDTIME (Patient taking differently: Take 40 mg by mouth at bedtime. ) 90 tablet 2  . Tamsulosin HCl (FLOMAX) 0.4 MG CAPS Take 0.4 mg by mouth daily after supper.    . temazepam (RESTORIL) 7.5 MG capsule Take 1 capsule (7.5 mg total) by mouth at bedtime as needed for sleep. 30 capsule 2  . traMADol-acetaminophen (ULTRACET) 37.5-325 MG tablet TAKE 2 TABLETS BY MOUTH 3 TIMES DAILY. 180 tablet 0  . vitamin B-12 1000 MCG tablet Take 1 tablet (1,000 mcg total) by mouth daily. 3 tablet    No current facility-administered medications for this visit.    Allergies:   Naproxen, Clopidogrel bisulfate, Sulfonamide derivatives, Other, Latex, and Tape    Social History:  The patient  reports that he has never smoked. He has never used smokeless tobacco. He reports that he does not drink alcohol and does not use drugs.   Family History:  The patient's ***family history includes Diverticulosis in his sister; Heart disease in his father; Pancreatic cancer in his mother; Stroke in his father.    ROS:  Please see the history of present illness.   Otherwise, review of systems are positive  for {NONE DEFAULTED:18576::"none"}.   All other systems are reviewed and negative.    PHYSICAL EXAM: VS:  There were no vitals taken for this visit. , BMI There is no height  or weight on file to calculate BMI. GEN: Well nourished, well developed, in no acute distress HEENT: normal Neck: no JVD, carotid bruits, or masses Cardiac: ***RRR; no murmurs, rubs, or gallops,no edema  Respiratory:  clear to auscultation bilaterally, normal work of breathing GI: soft, nontender, nondistended, + BS MS: no deformity or atrophy Skin: warm and dry, no rash Neuro:  Strength and sensation are intact Psych: euthymic mood, full affect   EKG:  EKG {ACTION; IS/IS BWI:20355974} ordered today. The ekg ordered today demonstrates ***   Recent Labs: 03/28/2019: TSH 1.817 03/29/2019: Hemoglobin 13.7; Platelets 175 07/14/2019: ALT 13; BUN 20; Creatinine, Ser 1.66; Potassium 4.2; Sodium 140    Lipid Panel    Component Value Date/Time   CHOL  08/09/2009 0949    164        ATP III CLASSIFICATION:  <200     mg/dL   Desirable  200-239  mg/dL   Borderline High  >=240    mg/dL   High          TRIG 114 08/09/2009 0949   HDL 36 (L) 08/09/2009 0949   CHOLHDL 4.6 08/09/2009 0949   VLDL 23 08/09/2009 0949   LDLCALC (H) 08/09/2009 0949    105        Total Cholesterol/HDL:CHD Risk Coronary Heart Disease Risk Table                     Men   Women  1/2 Average Risk   3.4   3.3  Average Risk       5.0   4.4  2 X Average Risk   9.6   7.1  3 X Average Risk  23.4   11.0        Use the calculated Patient Ratio above and the CHD Risk Table to determine the patient's CHD Risk.        ATP III CLASSIFICATION (LDL):  <100     mg/dL   Optimal  100-129  mg/dL   Near or Above                    Optimal  130-159  mg/dL   Borderline  160-189  mg/dL   High  >190     mg/dL   Very High      Wt Readings from Last 3 Encounters:  07/07/19 204 lb (92.5 kg)  07/07/19 204 lb (92.5 kg)  06/07/19 206 lb (93.4 kg)      Other studies Reviewed: Additional studies/ records that were reviewed today include:   Nuclear stress test 03/28/19:  There was no ST segment deviation noted during stress.    No T wave inversion was noted during stress.  This is an intermediate risk study.  The left ventricular ejection fraction is moderately decreased (30-44%).  Nuclear stress EF: 44%.  Findings consistent with prior myocardial infarction.    1. There are 2 fixed perfusion defects. A severe, medium size (10% of LV), fixed perfusion defect is present in the mid anteroseptum, extending into a apical septum/apex consistent with prior infarction. This defect could also be artifact due to ventricular pacing. A severe, medium size (12% of LV), fixed perfusion defect is present in the lateral wall, basal to mid segments consistent with prior infarction. No  reversible ischemia observed. The lateral wall defect was reported on the prior study from 01/13/2015.  2. Moderately reduced left ventricular function, EF 44%.  3. Global hypokinesis.  4. This is an intermediate risk study.   Echocardiogram 04/2019: 1. Mitral valve with calcification, mean gradient estimated at 8 mmHg but  MVA estimated as normal. Suggests mild mitral stenosis at this blood  pressure/HR of 74 bpm.  2. Left ventricular ejection fraction, by estimation, is 55 to 60%. The  left ventricle has normal function. The left ventricle has no regional  wall motion abnormalities. Left ventricular diastolic parameters are  consistent with Grade I diastolic  dysfunction (impaired relaxation). Elevated left ventricular end-diastolic  pressure.  3. Right ventricular systolic function is normal. The right ventricular  size is normal. There is mildly elevated pulmonary artery systolic  pressure.  4. Left atrial size was moderately dilated.  5. The mitral valve is degenerative. Mild mitral valve regurgitation.  Mild mitral stenosis.  6. The aortic valve is tricuspid. Aortic valve regurgitation is not  visualized. Mild to moderate aortic valve sclerosis/calcification is  present, without any evidence of aortic stenosis.  7. Aortic  dilatation noted. There is mild to moderate dilatation of the  ascending aorta measuring 42 mm.  8. The inferior vena cava is normal in size with <50% respiratory  variability, suggesting right atrial pressure of 8 mmHg.   ASSESSMENT AND PLAN:  1.  ***   Current medicines are reviewed at length with the patient today.  The patient {ACTIONS; HAS/DOES NOT HAVE:19233} concerns regarding medicines.  The following changes have been made:  {PLAN; NO CHANGE:13088:s}  Labs/ tests ordered today include: *** No orders of the defined types were placed in this encounter.    Disposition:   FU with *** in {gen number 2-06:015615} {Days to years:10300}  Signed, Abigail Butts, PA-C  10/27/2019 11:17 PM

## 2019-10-29 ENCOUNTER — Ambulatory Visit: Payer: Medicare PPO | Admitting: Medical

## 2019-10-30 ENCOUNTER — Encounter: Payer: Self-pay | Admitting: Dermatology

## 2019-10-30 NOTE — Progress Notes (Signed)
   Follow-Up Visit   Subjective  Gary [name] is a 86 y.o. male who presents for the following: Follow-up (Pt stated--still have the spot on the left hand---painful, redness, discoloration, elevated. Tried the sample cream but not helping.).  Recurrent growth Location: Left hand Duration:  Quality:  Associated Signs/Symptoms: Quite painful Modifying Factors:  Severity:  Timing: Context:   Objective  Well appearing patient in no apparent distress; mood and affect are within normal limits.  A focused examination was performed including Head, neck, arms.. Relevant physical exam findings are noted in the Assessment and Plan.   Assessment & Plan    Neoplasm of uncertain behavior of skin Left Dorsal Hand  Skin / nail biopsy Type of biopsy: tangential   Informed consent: discussed and consent obtained   Timeout: patient name, date of birth, surgical site, and procedure verified   Procedure prep:  Patient was prepped and draped in usual sterile fashion Prep type:  Chlorhexidine Anesthesia: the lesion was anesthetized in a standard fashion   Anesthetic:  1% lidocaine w/ epinephrine 1-100,000 local infiltration Instrument used: flexible razor blade   Hemostasis achieved with: ferric subsulfate   Outcome: patient tolerated procedure well   Post-procedure details: wound care instructions given    Specimen 1 - Surgical pathology Differential Diagnosis: bcc vs scc  Check Margins: No     I, Lavonna Monarch, MD, have reviewed all documentation for this visit.  The documentation on 10/30/19 for the exam, diagnosis, procedures, and orders are all accurate and complete.

## 2019-11-02 ENCOUNTER — Ambulatory Visit
Admission: RE | Admit: 2019-11-02 | Discharge: 2019-11-02 | Disposition: A | Discharge status: Home / Self Care | Payer: Medicare PPO | Source: Ambulatory Visit | Attending: Nephrology | Admitting: Nephrology

## 2019-11-02 DIAGNOSIS — N1832 Chronic kidney disease, stage 3b: Secondary | ICD-10-CM

## 2019-11-03 ENCOUNTER — Encounter: Payer: Self-pay | Admitting: Physician Assistant

## 2019-11-03 ENCOUNTER — Other Ambulatory Visit: Payer: Self-pay

## 2019-11-03 ENCOUNTER — Ambulatory Visit: Payer: Medicare PPO | Admitting: Physician Assistant

## 2019-11-03 VITALS — BP 146/78 | HR 68 | Ht 65.0 in | Wt 208.6 lb

## 2019-11-03 DIAGNOSIS — E785 Hyperlipidemia, unspecified: Secondary | ICD-10-CM

## 2019-11-03 DIAGNOSIS — R55 Syncope and collapse: Secondary | ICD-10-CM | POA: Diagnosis not present

## 2019-11-03 DIAGNOSIS — Z950 Presence of cardiac pacemaker: Secondary | ICD-10-CM | POA: Diagnosis not present

## 2019-11-03 DIAGNOSIS — I2510 Atherosclerotic heart disease of native coronary artery without angina pectoris: Secondary | ICD-10-CM | POA: Diagnosis not present

## 2019-11-03 DIAGNOSIS — I10 Essential (primary) hypertension: Secondary | ICD-10-CM

## 2019-11-03 DIAGNOSIS — N183 Chronic kidney disease, stage 3 unspecified: Secondary | ICD-10-CM

## 2019-11-03 NOTE — Patient Instructions (Signed)
Medication Instructions:  Your physician recommends that you continue on your current medications as directed. Please refer to the Current Medication list given to you today.  *If you need a refill on your cardiac medications before your next appointment, please call your pharmacy*  Lab Work: Your physician recommends that you return for lab work TODAY:   CBC  CMET  If you have labs (blood work) drawn today and your tests are completely normal, you will receive your results only by: Marland Kitchen MyChart Message (if you have MyChart) OR . A paper copy in the mail If you have any lab test that is abnormal or we need to change your treatment, we will call you to review the results.  Testing/Procedures: NONE ordered at this time of appointment   Follow-Up: At [facility], you and your health needs are our priority.  As part of our continuing mission to provide you with exceptional heart care, we have created designated Provider Care Teams.  These Care Teams include your primary Cardiologist (physician) and Advanced Practice Providers (APPs -  Physician Assistants and Nurse Practitioners) who all work together to provide you with the care you need, when you need it.  Your next appointment:   3 week(s)  The format for your next appointment:   In Person  Provider:   Almyra Deforest, PA-C  Other Instructions

## 2019-11-03 NOTE — Progress Notes (Signed)
Cardiology Office Note:    Date:  11/04/2019   ID:  Gary [name], DOB Apr 07, 1932, MRN [id number]  PCP:  Clovia Cuff, MD  [place] Cardiologist:  Pixie Casino, MD  [facility] Electrophysiologist:  Virl Axe, MD   Referring MD: Clovia Cuff, MD   Chief Complaint  Patient presents with  . Follow-up    seen for Dr. Debara Pickett    History of Present Illness:    Gary [name] is a 86 y.o. male with a hx of basal cell carcinoma, CAD, CHB s/p [employer] PPM, GERD, history of GI bleed, hypertension, hyperlipidemia, CKD stage III, OSA followed by Dr. Radford Pax and history of syncope. Patient had NSTEMI in 2011 with occluded left circumflex with collaterals. This was managed medically.  Echocardiogram obtained in March 2020 showed EF 60 to 65%, grade 1 DD, no regional wall motion abnormality, mild ascending aortic aneurysm measuring 37 mm.  Patient presented to the ED back in February 2021 for evaluation of vague chest discomfort.  He was discharged to have outpatient follow-up, however returned a few days later with similar complaint.  Myoview showed evidence of previous MI however no reversible ischemia, EF 44%.  He was seen by Roby Lofts on 03/31/2019, blood pressure was elevated at the time.  Low-dose carvedilol was added to his medical regimen.  He previously had peripheral edema with amlodipine.  Echocardiogram was repeated on 04/19/2019 which showed EF 55 to 60%, grade 1 DD, mild MR, ascending aorta measuring at 42 mm.  Last device interrogation obtained on 10/01/2019 showed normal device function.  Renal ultrasound obtained on 11/02/2019 showed diffuse cortical thinning with increased echogenicity within the renal parenchyma compatible with chronic medical renal disease, 1 cm benign appearing cyst at the interpolar left kidney.   Patient presents today for evaluation of presyncope that occurred on the night of 10/31/2019.  According to his wife, he was sitting on the side of the bed  when he started having dizzy spell and a flushing sensation to the head.  He feel a warmth going up to the face and head.  He tried to get up to go to the bathroom however quickly fell back down to the bed.  He felt similar symptom before prior to his pacemaker implantation, however has not felt a similar symptom since then.  He called EMS, according to EKG strips brought by EMS, EKG showed V paced rhythm.  Blood pressure was 174/84 on EMS arrival.  Orthostatic vital sign was negative.  Heart rate 60 to 70 bpm.  O2 saturation 96%.  CBG 155, temperature 97.8.  After discussing with the patient, he opted not to go to the local ED.  He says the total duration of the symptom was less than 10 minutes.  He did not have any recent exertional chest pain or shortness of breath.  I suspect his symptom is consistent with vasovagal episode.  I recommended CMP and CBC to rule out secondary causes.  I will discuss with Dr. Debara Pickett to see if he would recommend a repeat device interrogation to rule out arrhythmia.  Otherwise without chest pain worsening shortness of breath recently, I did not recommend any repeat echocardiogram or Myoview since he just had reassuring studies back in February and March.  Otherwise I plan to bring the patient back in 3 weeks for reassessment.    Past Medical History:  Diagnosis Date  . Adenomatous colon polyp    2010   . Anemia   . Anxiety   .  Arthritis    "hips and lower back"  . Arthritis pain   . Atypical mole 06/21/2003   left neck, inferior - slight to moderate atypia  . Basal cell carcinoma 10/04/2014   right shin bcc (tx cx3, 57fu )  . Blood transfusion   . Chest pain 03/29/11   2D Echo without contrast on 03/29/11 - EF= 55-60%.  . Cholecystitis   . Chronic back pain greater than 3 months duration   . Chronic kidney disease   . Colon polyps   . Complication of anesthesia    once slow to wake up  . Coronary angioplasty status 2011  . Coronary artery disease   . Diarrhea     . Diverticulosis   . Dyslipidemia   . GERD (gastroesophageal reflux disease)   . GI bleeding after 07/2009   "triggered by Plavix & ASA; from my diverticulosis"  . H/O Clostridium difficile infection   . H/O myocardial perfusion scan 04/04/11   For Pre-noncardiac surgery - EF = 67%, no ischemia, and is considered low risk.  Marland Kitchen Heart attack (HCC) 07/2009   nonSTEMI with an occluded circumflex artery and collaterals.  Marland Kitchen Heart murmur   . Hemorrhoids   . History of shingles   . Hyperlipidemia   . Hypertension   . Kidney stones   . Lower extremity edema    Taking furosemide and it seems to be helping.  . Neuromuscular disorder (HCC)   . Nonspecific chest pain 08/26/2012   Went to ED 08/26/12  . Squamous cell carcinoma of skin 05/13/2000   Right outer forehead  . Squamous cell carcinoma of skin 03/02/2001   Post crown - tx 03/19/2001  . Squamous cell carcinoma of skin 09/01/2001   Left post auricular - tx 10/09/2001  . Squamous cell carcinoma of skin 06/21/2003   Top right ear - tx 07/05/2003  . Squamous cell carcinoma of skin 06/21/2003   Right upper arm - tx 07/05/2003  . Squamous cell carcinoma of skin 10/12/2003   Left superior ear lobe - MOHs  . Squamous cell carcinoma of skin 12/15/2006   Right ear, superior - tx 01/26/2007  . Squamous cell carcinoma of skin 03/13/2009   Right temple - tx 05/08/2009  . Squamous cell carcinoma of skin 03/24/2013   Upper left forearm - tx p bx  . Squamous cell carcinoma of skin 12/06/2013   Right forearm - CX3 + 5FU  . Squamous cell carcinoma of skin 02/10/2014   Right hand - tx p bx  . Squamous cell carcinoma of skin 10/04/2014   Right shin - tx p bx  . Squamous cell carcinoma of skin 08/21/2016   Left hand - tx p bx  . Squamous cell carcinoma of skin 01/01/2017   Right hand - tx 02/06/2017  . Squamous cell carcinoma of skin 10/21/2017   Left side of scalp - tx 03/12/2018  . Squamous cell carcinoma of skin 10/21/2017   Left post scalp,  inf - tx 08/13/2018  . Squamous cell carcinoma of skin 10/21/2017   Right scalp - tx p bx  . Squamous cell carcinoma of skin 10/21/2017   Left cheek - tx 03/12/2018  . Squamous cell carcinoma of skin 08/13/2018   Left top hand, proximal pinky - tx p bx  . Squamous cell carcinoma of skin 12/23/2018   Right bicep lower - tx p bx  . Squamous cell carcinoma of skin 05/18/2019   Right 4th finger metacarpophalangeal joint  . Squamous cell  carcinoma of skin 05/18/2019   Left 5th finger metacarpophalangeal joint  . Squamous cell carcinoma of skin 05/18/2019   Left 2nd finger metacarpophalangeal joint  . Stomach problems   . Syncope and collapse 04/16/2018    Past Surgical History:  Procedure Laterality Date  . BACK SURGERY  1997  . CARDIAC CATHETERIZATION  08/09/2009   Circumflex 100% occluded with right-to-left collaterals, mild irregularities in the proximal and distal LAD and diagonal system, normal LV systolic function, and minor infrarenal irregularities, but no abdominal aortic aneurysm.  . CHOLECYSTECTOMY  04/12/2011   Procedure: CHOLECYSTECTOMY;  Surgeon: Adin Hector, MD;  Location: [facility];  Service: General;  Laterality: N/A;  . CHOLECYSTECTOMY  04/12/2011   Procedure: LAPAROSCOPIC CHOLECYSTECTOMY;  Surgeon: Adin Hector, MD;  Location: [facility];  Service: General;  Laterality: N/A;  converted to open  . COLON SURGERY     COLONOSCOPY  . ESOPHAGOGASTRODUODENOSCOPY N/A 08/05/2013   Procedure: ESOPHAGOGASTRODUODENOSCOPY (EGD);  Surgeon: Jerene Bears, MD;  Location: [facility];  Service: Gastroenterology;  Laterality: N/A;  . EYE SURGERY  1942   "right eye was crossed; they  corrected it"  . LUMBAR FUSION  07/22/2012  . PACEMAKER IMPLANT N/A 04/17/2018   Procedure: PACEMAKER IMPLANT;  Surgeon: Deboraha Sprang, MD;  Location: [facility];  Service: Cardiovascular;  Laterality: N/A;  . POSTERIOR FUSION LUMBAR SPINE  08/1995   "bottom 4 vertebra"  . SUBTOTAL COLECTOMY  01/24/2010   . TOTAL HIP ARTHROPLASTY Right 08/03/2013   Procedure: TOTAL HIP ARTHROPLASTY ANTERIOR APPROACH;  Surgeon: Hessie Dibble, MD;  Location: [facility];  Service: Orthopedics;  Laterality: Right;    Current Medications: Current Meds  Medication Sig  . acetaminophen (TYLENOL) 500 MG tablet Take 1,000 mg by mouth every 8 (eight) hours as needed for fever.  Marland Kitchen amLODipine (NORVASC) 5 MG tablet Take 1 tablet (5 mg total) by mouth daily.  Marland Kitchen aspirin EC 81 MG tablet Take 81 mg by mouth daily.  . blood glucose meter kit and supplies KIT Dispense based on patient and insurance preference. Use up to four times daily as directed. (FOR ICD-9 250.00, 250.01).  . carvedilol (COREG) 3.125 MG tablet Take 1 tablet (3.125 mg total) by mouth 2 (two) times daily with a meal.  . citalopram (CELEXA) 40 MG tablet Take 1 tablet (40 mg total) by mouth daily.  . Cyanocobalamin (VITAMIN B-12 IJ) Inject 1 Dose as directed once a week.  . doxazosin (CARDURA) 4 MG tablet Take 4 mg by mouth daily.   . fluticasone (FLONASE) 50 MCG/ACT nasal spray Place 2 sprays into both nostrils daily.   . furosemide (LASIX) 20 MG tablet Take 20 mg by mouth. Monday,wednesday and friday  . nitroGLYCERIN (NITROSTAT) 0.4 MG SL tablet PLACE 1 TABLET UNDER THE TONGUE EVERY 5 MINUTES AS NEEDED FOR CHEST PAIN FOR 3 DOSES (Patient taking differently: Place 0.4 mg under the tongue every 5 (five) minutes as needed for chest pain. )  . pregabalin (LYRICA) 75 MG capsule Take 1 capsule (75 mg total) by mouth 3 (three) times daily.  . simvastatin (ZOCOR) 40 MG tablet TAKE 1 TABLET BY MOUTH AT BEDTIME (Patient taking differently: Take 40 mg by mouth at bedtime. )  . Tamsulosin HCl (FLOMAX) 0.4 MG CAPS Take 0.4 mg by mouth daily after supper.  . traMADol (ULTRAM) 50 MG tablet   . vitamin B-12 1000 MCG tablet Take 1 tablet (1,000 mcg total) by mouth daily.     Allergies:  Naproxen, Clopidogrel bisulfate, Sulfonamide derivatives, Other, Latex, and Tape    Social History   Socioeconomic History  . Marital status: Married    Spouse name: Not on file  . Number of children: 2  . Years of education: Not on file  . Highest education level: Not on file  Occupational History  . Occupation: Art therapist  . Occupation: retired  Tobacco Use  . Smoking status: Never Smoker  . Smokeless tobacco: Never Used  Vaping Use  . Vaping Use: Never used  Substance and Sexual Activity  . Alcohol use: No  . Drug use: No  . Sexual activity: Not Currently  Other Topics Concern  . Not on file  Social History Narrative   Daily caffeine use.   Social Determinants of Health   Financial Resource Strain: Low Risk   . Difficulty of Paying Living Expenses: Not hard at all  Food Insecurity: No Food Insecurity  . Worried About Charity fundraiser in the Last Year: Never true  . Ran Out of Food in the Last Year: Never true  Transportation Needs: No Transportation Needs  . Lack of Transportation (Medical): No  . Lack of Transportation (Non-Medical): No  Physical Activity: Sufficiently Active  . Days of Exercise per Week: 7 days  . Minutes of Exercise per Session: 30 min  Stress: No Stress Concern Present  . Feeling of Stress : Not at all  Social Connections:   . Frequency of Communication with Friends and Family: Not on file  . Frequency of Social Gatherings with Friends and Family: Not on file  . Attends Religious Services: Not on file  . Active Member of Clubs or Organizations: Not on file  . Attends Archivist Meetings: Not on file  . Marital Status: Not on file     Family History: The patient's family history includes Diverticulosis in his sister; Heart disease in his father; Pancreatic cancer in his mother; Stroke in his father. There is no history of Colon cancer.  ROS:   Please see the history of present illness.     All other systems reviewed and are negative.  EKGs/Labs/Other Studies Reviewed:    The following studies were  reviewed today:  Echo 04/19/2019 1. Mitral valve with calcification, mean gradient estimated at 8 mmHg but  MVA estimated as normal. Suggests mild mitral stenosis at this blood  pressure/HR of 74 bpm.  2. Left ventricular ejection fraction, by estimation, is 55 to 60%. The  left ventricle has normal function. The left ventricle has no regional  wall motion abnormalities. Left ventricular diastolic parameters are  consistent with Grade I diastolic  dysfunction (impaired relaxation). Elevated left ventricular end-diastolic  pressure.  3. Right ventricular systolic function is normal. The right ventricular  size is normal. There is mildly elevated pulmonary artery systolic  pressure.  4. Left atrial size was moderately dilated.  5. The mitral valve is degenerative. Mild mitral valve regurgitation.  Mild mitral stenosis.  6. The aortic valve is tricuspid. Aortic valve regurgitation is not  visualized. Mild to moderate aortic valve sclerosis/calcification is  present, without any evidence of aortic stenosis.  7. Aortic dilatation noted. There is mild to moderate dilatation of the  ascending aorta measuring 42 mm.  8. The inferior vena cava is normal in size with <50% respiratory  variability, suggesting right atrial pressure of 8 mmHg.   EKG:  EKG is not ordered today.   Recent Labs: 03/28/2019: TSH 1.817 11/03/2019: ALT 13; BUN 20; Creatinine,  Ser 1.66; Hemoglobin 13.0; Platelets 173; Potassium 5.9; Sodium 142  Recent Lipid Panel    Component Value Date/Time   CHOL  08/09/2009 0949    164        ATP III CLASSIFICATION:  <200     mg/dL   Desirable  200-239  mg/dL   Borderline High  >=240    mg/dL   High          TRIG 114 08/09/2009 0949   HDL 36 (L) 08/09/2009 0949   CHOLHDL 4.6 08/09/2009 0949   VLDL 23 08/09/2009 0949   LDLCALC (H) 08/09/2009 0949    105        Total Cholesterol/HDL:CHD Risk Coronary Heart Disease Risk Table                     Men   Women  1/2  Average Risk   3.4   3.3  Average Risk       5.0   4.4  2 X Average Risk   9.6   7.1  3 X Average Risk  23.4   11.0        Use the calculated Patient Ratio above and the CHD Risk Table to determine the patient's CHD Risk.        ATP III CLASSIFICATION (LDL):  <100     mg/dL   Optimal  100-129  mg/dL   Near or Above                    Optimal  130-159  mg/dL   Borderline  160-189  mg/dL   High  >190     mg/dL   Very High    Physical Exam:    VS:  BP (!) 146/78   Pulse 68   Ht $R'5\' 5"'Wr$  (1.651 m)   Wt 208 lb 9.6 oz (94.6 kg)   SpO2 95%   BMI 34.71 kg/m     Wt Readings from Last 3 Encounters:  11/03/19 208 lb 9.6 oz (94.6 kg)  07/07/19 204 lb (92.5 kg)  07/07/19 204 lb (92.5 kg)     GEN:  Well nourished, well developed in no acute distress HEENT: Normal NECK: No JVD; No carotid bruits LYMPHATICS: No lymphadenopathy CARDIAC: RRR, no murmurs, rubs, gallops RESPIRATORY:  Clear to auscultation without rales, wheezing or rhonchi  ABDOMEN: Soft, non-tender, non-distended MUSCULOSKELETAL:  No edema; No deformity  SKIN: Warm and dry NEUROLOGIC:  Alert and oriented x 3 PSYCHIATRIC:  Normal affect   ASSESSMENT:    1. Pre-syncope   2. Coronary artery disease involving native coronary artery of native heart without angina pectoris   3. Pacemaker   4. Essential hypertension   5. Hyperlipidemia LDL goal <70   6. Stage 3 chronic kidney disease, unspecified whether stage 3a or 3b CKD    PLAN:    In order of problems listed above:  1. Presyncope: Patient had flushing sensation and dizziness last Sunday night.  He tried to get up to go to the bathroom however ended up falling back down to the bed.  Symptom sounds more like a vasovagal episode rather than of arrhythmia.  He just had a device interrogation near the end of August which showed normal device function.  Last echocardiogram was in March 2021, EF 55 to 60%, mild MR.  I will check with Dr. Debara Pickett to see if he would recommend  a repeat device interrogation and repeat echocardiogram.  2. CAD: Denies any  recent chest discomfort.  Last Myoview obtained in December 2016 showed EF 55%, fixed defect in the area of known graft occlusion  3. History of pacemaker: Recent device interrogation on 10/11/2019 showed a normal device function.  EKG obtained by EMS recently showed ventricularly paced rhythm  4. Hypertension: Blood pressure borderline elevated today, hesitant to drop the blood pressure even further given the recent presyncope  5. Hyperlipidemia: Continue Zocor  6. CKD stage III: Stable on last lab work.  We will repeat a CBC and a CMP to rule out secondary causes of recent presyncope.   Medication Adjustments/Labs and Tests Ordered: Current medicines are reviewed at length with the patient today.  Concerns regarding medicines are outlined above.  Orders Placed This Encounter  Procedures  . CBC  . Comprehensive metabolic panel   No orders of the defined types were placed in this encounter.   Patient Instructions  Medication Instructions:  Your physician recommends that you continue on your current medications as directed. Please refer to the Current Medication list given to you today.  *If you need a refill on your cardiac medications before your next appointment, please call your pharmacy*  Lab Work: Your physician recommends that you return for lab work TODAY:   CBC  CMET  If you have labs (blood work) drawn today and your tests are completely normal, you will receive your results only by: Marland Kitchen MyChart Message (if you have MyChart) OR . A paper copy in the mail If you have any lab test that is abnormal or we need to change your treatment, we will call you to review the results.  Testing/Procedures: NONE ordered at this time of appointment   Follow-Up: At [facility], you and your health needs are our priority.  As part of our continuing mission to provide you with exceptional heart care, we  have created designated Provider Care Teams.  These Care Teams include your primary Cardiologist (physician) and Advanced Practice Providers (APPs -  Physician Assistants and Nurse Practitioners) who all work together to provide you with the care you need, when you need it.  Your next appointment:   3 week(s)  The format for your next appointment:   In Person  Provider:   Almyra Deforest, PA-C  Other Instructions      Signed, Almyra Deforest, [place]  11/04/2019 2:45 PM    [place]

## 2019-11-04 ENCOUNTER — Encounter: Payer: Self-pay | Admitting: Physician Assistant

## 2019-11-04 ENCOUNTER — Other Ambulatory Visit: Payer: Self-pay

## 2019-11-04 DIAGNOSIS — E875 Hyperkalemia: Secondary | ICD-10-CM

## 2019-11-04 LAB — COMPREHENSIVE METABOLIC PANEL
ALT: 13 IU/L (ref 0–44)
AST: 19 IU/L (ref 0–40)
Albumin/Globulin Ratio: 2.3 — ABNORMAL HIGH (ref 1.2–2.2)
Albumin: 4.5 g/dL (ref 3.6–4.6)
Alkaline Phosphatase: 43 IU/L — ABNORMAL LOW (ref 44–121)
BUN/Creatinine Ratio: 12 (ref 10–24)
BUN: 20 mg/dL (ref 8–27)
Bilirubin Total: 0.4 mg/dL (ref 0.0–1.2)
CO2: 28 mmol/L (ref 20–29)
Calcium: 9.7 mg/dL (ref 8.6–10.2)
Chloride: 102 mmol/L (ref 96–106)
Creatinine, Ser: 1.66 mg/dL — ABNORMAL HIGH (ref 0.76–1.27)
GFR calc Af Amer: 43 mL/min/{1.73_m2} — ABNORMAL LOW (ref >59)
GFR calc non Af Amer: 37 mL/min/{1.73_m2} — ABNORMAL LOW (ref >59)
Globulin, Total: 2 g/dL (ref 1.5–4.5)
Glucose: 142 mg/dL — ABNORMAL HIGH (ref 65–99)
Potassium: 5.9 mmol/L (ref 3.5–5.2)
Sodium: 142 mmol/L (ref 134–144)
Total Protein: 6.5 g/dL (ref 6.0–8.5)

## 2019-11-04 LAB — CBC
Hematocrit: 40.2 % (ref 37.5–51.0)
Hemoglobin: 13 g/dL (ref 13.0–17.7)
MCH: 29.9 pg (ref 26.6–33.0)
MCHC: 32.3 g/dL (ref 31.5–35.7)
MCV: 92 fL (ref 79–97)
Platelets: 173 10*3/uL (ref 150–450)
RBC: 4.35 x10E6/uL (ref 4.14–5.80)
RDW: 12.1 % (ref 11.6–15.4)
WBC: 7.1 10*3/uL (ref 3.4–10.8)

## 2019-11-10 ENCOUNTER — Ambulatory Visit: Payer: Medicare PPO | Admitting: Podiatry

## 2019-11-11 ENCOUNTER — Telehealth: Payer: Self-pay | Admitting: Physician Assistant

## 2019-11-11 NOTE — Telephone Encounter (Signed)
I called and spoke with his wife, Dr. Debara Pickett recommend hold off on additional workup for vasovagal episode unless symptom recurs.

## 2019-11-18 ENCOUNTER — Other Ambulatory Visit: Payer: Self-pay

## 2019-11-18 ENCOUNTER — Ambulatory Visit (INDEPENDENT_AMBULATORY_CARE_PROVIDER_SITE_OTHER): Payer: Medicare PPO | Admitting: Otolaryngology

## 2019-11-18 VITALS — Temp 97.5°F

## 2019-11-18 DIAGNOSIS — H6123 Impacted cerumen, bilateral: Secondary | ICD-10-CM | POA: Diagnosis not present

## 2019-11-18 DIAGNOSIS — H903 Sensorineural hearing loss, bilateral: Secondary | ICD-10-CM | POA: Diagnosis not present

## 2019-11-18 NOTE — Progress Notes (Signed)
HPI: Gary [name] is a 86 y.o. male who returns today for evaluation of longstanding hearing loss.  He has more hearing loss in the right ear than the left.  He has previously used hearing aids that were ordered over the Internet.  He presents today for reevaluation and obtaining new hearing aids.  He is scheduled for hearing test.  He also complains of occasional ringing in the ear and sometimes hearing his pulse in the right ear. He has previously been told that he had a hole in the eardrum..  Past Medical History:  Diagnosis Date   Adenomatous colon polyp    2010    Anemia    Anxiety    Arthritis    "hips and lower back"   Arthritis pain    Atypical mole 06/21/2003   left neck, inferior - slight to moderate atypia   Basal cell carcinoma 10/04/2014   right shin bcc (tx cx3, 52fu )   Blood transfusion    Chest pain 03/29/11   2D Echo without contrast on 03/29/11 - EF= 55-60%.   Cholecystitis    Chronic back pain greater than 3 months duration    Chronic kidney disease    Colon polyps    Complication of anesthesia    once slow to wake up   Coronary angioplasty status 2011   Coronary artery disease    Diarrhea    Diverticulosis    Dyslipidemia    GERD (gastroesophageal reflux disease)    GI bleeding after 07/2009   "triggered by Plavix & ASA; from my diverticulosis"   H/O Clostridium difficile infection    H/O myocardial perfusion scan 04/04/11   For Pre-noncardiac surgery - EF = 67%, no ischemia, and is considered low risk.   Heart attack ([place]) 07/2009   nonSTEMI with an occluded circumflex artery and collaterals.   Heart murmur    Hemorrhoids    History of shingles    Hyperlipidemia    Hypertension    Kidney stones    Lower extremity edema    Taking furosemide and it seems to be helping.   Neuromuscular disorder ([place])    Nonspecific chest pain 08/26/2012   Went to ED 08/26/12   Squamous cell carcinoma of skin 05/13/2000   Right outer  forehead   Squamous cell carcinoma of skin 03/02/2001   Post crown - tx 03/19/2001   Squamous cell carcinoma of skin 09/01/2001   Left post auricular - tx 10/09/2001   Squamous cell carcinoma of skin 06/21/2003   Top right ear - tx 07/05/2003   Squamous cell carcinoma of skin 06/21/2003   Right upper arm - tx 07/05/2003   Squamous cell carcinoma of skin 10/12/2003   Left superior ear lobe - MOHs   Squamous cell carcinoma of skin 12/15/2006   Right ear, superior - tx 01/26/2007   Squamous cell carcinoma of skin 03/13/2009   Right temple - tx 05/08/2009   Squamous cell carcinoma of skin 03/24/2013   Upper left forearm - tx p bx   Squamous cell carcinoma of skin 12/06/2013   Right forearm - CX3 + 5FU   Squamous cell carcinoma of skin 02/10/2014   Right hand - tx p bx   Squamous cell carcinoma of skin 10/04/2014   Right shin - tx p bx   Squamous cell carcinoma of skin 08/21/2016   Left hand - tx p bx   Squamous cell carcinoma of skin 01/01/2017   Right hand - tx 02/06/2017  Squamous cell carcinoma of skin 10/21/2017   Left side of scalp - tx 03/12/2018   Squamous cell carcinoma of skin 10/21/2017   Left post scalp, inf - tx 08/13/2018   Squamous cell carcinoma of skin 10/21/2017   Right scalp - tx p bx   Squamous cell carcinoma of skin 10/21/2017   Left cheek - tx 03/12/2018   Squamous cell carcinoma of skin 08/13/2018   Left top hand, proximal pinky - tx p bx   Squamous cell carcinoma of skin 12/23/2018   Right bicep lower - tx p bx   Squamous cell carcinoma of skin 05/18/2019   Right 4th finger metacarpophalangeal joint   Squamous cell carcinoma of skin 05/18/2019   Left 5th finger metacarpophalangeal joint   Squamous cell carcinoma of skin 05/18/2019   Left 2nd finger metacarpophalangeal joint   Stomach problems    Syncope and collapse 04/16/2018   Past Surgical History:  Procedure Laterality Date   [place]  08/09/2009   Circumflex 100% occluded with right-to-left collaterals, mild irregularities in the proximal and distal LAD and diagonal system, normal LV systolic function, and minor infrarenal irregularities, but no abdominal aortic aneurysm.   CHOLECYSTECTOMY  04/12/2011   Procedure: CHOLECYSTECTOMY;  Surgeon: Adin Hector, MD;  Location: [facility];  Service: General;  Laterality: N/A;   CHOLECYSTECTOMY  04/12/2011   Procedure: LAPAROSCOPIC CHOLECYSTECTOMY;  Surgeon: Adin Hector, MD;  Location: [facility];  Service: General;  Laterality: N/A;  converted to open   COLON SURGERY     COLONOSCOPY   ESOPHAGOGASTRODUODENOSCOPY N/A 08/05/2013   Procedure: ESOPHAGOGASTRODUODENOSCOPY (EGD);  Surgeon: Jerene Bears, MD;  Location: [facility];  Service: Gastroenterology;  Laterality: N/A;   EYE SURGERY  1942   "right eye was crossed; they  corrected it"   LUMBAR FUSION  07/22/2012   PACEMAKER IMPLANT N/A 04/17/2018   Procedure: PACEMAKER IMPLANT;  Surgeon: Deboraha Sprang, MD;  Location: [facility];  Service: Cardiovascular;  Laterality: N/A;   POSTERIOR FUSION LUMBAR SPINE  08/1995   "bottom 4 vertebra"   SUBTOTAL COLECTOMY  01/24/2010   TOTAL HIP ARTHROPLASTY Right 08/03/2013   Procedure: TOTAL HIP ARTHROPLASTY ANTERIOR APPROACH;  Surgeon: Hessie Dibble, MD;  Location: [facility];  Service: Orthopedics;  Laterality: Right;   Social History   Socioeconomic History   Marital status: Married    Spouse name: Not on file   Number of children: 2   Years of education: Not on file   Highest education level: Not on file  Occupational History   Occupation: Art therapist   Occupation: retired  Tobacco Use   Smoking status: Never Smoker   Smokeless tobacco: Never Used  Scientific laboratory technician Use: Never used  Substance and Sexual Activity   Alcohol use: No   Drug use: No   Sexual activity: Not Currently  Other Topics Concern   Not on file  Social History Narrative    Daily caffeine use.   Social Determinants of Health   Financial Resource Strain: Low Risk    Difficulty of Paying Living Expenses: Not hard at all  Food Insecurity: No Food Insecurity   Worried About Charity fundraiser in the Last Year: Never true   [place] in the Last Year: Never true  Transportation Needs: No Transportation Needs   Lack of Transportation (Medical): No   Lack of Transportation (Non-Medical): No  Physical Activity: Sufficiently Active  Days of Exercise per Week: 7 days   Minutes of Exercise per Session: 30 min  Stress: No Stress Concern Present   Feeling of Stress : Not at all  Social Connections:    Frequency of Communication with Friends and Family: Not on file   Frequency of Social Gatherings with Friends and Family: Not on file   Attends Religious Services: Not on Electrical engineer or Organizations: Not on file   Attends Archivist Meetings: Not on file   Marital Status: Not on file   Family History  Problem Relation Age of Onset   Heart disease Father    Stroke Father    Pancreatic cancer Mother    Diverticulosis Sister        x4   Colon cancer Neg Hx    Allergies  Allergen Reactions   Naproxen Rash   Clopidogrel Bisulfate Other (See Comments)    H/O diverticulitis; prone to rectal bleeding.  Plavix complicated this.  jkl   Sulfonamide Derivatives Hives   Other Other (See Comments)    Narcotics-C. Diff   Latex Rash   Tape Itching and Rash    Latex-Adhesive tape   Prior to Admission medications   Medication Sig Start Date End Date Taking? Authorizing Provider  acetaminophen (TYLENOL) 500 MG tablet Take 1,000 mg by mouth every 8 (eight) hours as needed for fever.    [provider]  amLODipine (NORVASC) 5 MG tablet Take 1 tablet (5 mg total) by mouth daily. 10/30/18 11/03/19  Pixie Casino, MD  aspirin EC 81 MG tablet Take 81 mg by mouth daily.    [provider]   blood glucose meter kit and supplies KIT Dispense based on patient and insurance preference. Use up to four times daily as directed. (FOR ICD-9 250.00, 250.01). 03/29/19   Geradine Girt, DO  carvedilol (COREG) 3.125 MG tablet Take 1 tablet (3.125 mg total) by mouth 2 (two) times daily with a meal. 04/12/19   Kroeger, Lorelee Cover., PA-C  citalopram (CELEXA) 40 MG tablet Take 1 tablet (40 mg total) by mouth daily. 08/19/19   Minette Brine, FNP  Cyanocobalamin (VITAMIN B-12 IJ) Inject 1 Dose as directed once a week.    [provider]  doxazosin (CARDURA) 4 MG tablet Take 4 mg by mouth daily.     [provider]  fluticasone (FLONASE) 50 MCG/ACT nasal spray Place 2 sprays into both nostrils daily.     [provider]  furosemide (LASIX) 20 MG tablet Take 20 mg by mouth. Monday,wednesday and friday    [provider]  nitroGLYCERIN (NITROSTAT) 0.4 MG SL tablet PLACE 1 TABLET UNDER THE TONGUE EVERY 5 MINUTES AS NEEDED FOR CHEST PAIN FOR 3 DOSES Patient taking differently: Place 0.4 mg under the tongue every 5 (five) minutes as needed for chest pain.  10/25/16   Hilty, Nadean Corwin, MD  pregabalin (LYRICA) 75 MG capsule Take 1 capsule (75 mg total) by mouth 3 (three) times daily. 09/08/19 09/07/20  Minette Brine, FNP  simvastatin (ZOCOR) 40 MG tablet TAKE 1 TABLET BY MOUTH AT BEDTIME Patient taking differently: Take 40 mg by mouth at bedtime.  03/18/16   Pixie Casino, MD  Tamsulosin HCl (FLOMAX) 0.4 MG CAPS Take 0.4 mg by mouth daily after supper. 02/04/11   Charlynne Cousins, MD  traMADol Veatrice Bourbon) 50 MG tablet  10/21/19   [provider]  vitamin B-12 1000 MCG tablet Take 1 tablet (1,000  mcg total) by mouth daily. 03/29/19   Geradine Girt, DO     Positive ROS: Otherwise negative  All other systems have been reviewed and were otherwise negative with the exception of those mentioned in the HPI and as above.  Physical Exam: Constitutional: Alert, well-appearing,  no acute distress Ears: External ears without lesions or tenderness.  He had minimal wax buildup in the ears that was cleaned with suction.  TMs are intact bilaterally.  On the right side he has a monomeric membrane but no perforation.  Auscultation of the ears revealed no objective pulsatile tinnitus.  Hearing screening with the 512 tuning fork revealed marked decreased hearing in both ears right slightly worse than left with Weber lateralizing to the left side. Nasal: External nose without lesions.. Clear nasal passages Oral: Lips and gums without lesions. Tongue and palate mucosa without lesions. Posterior oropharynx clear. Neck: No palpable adenopathy or masses Respiratory: Breathing comfortably  Skin: No facial/neck lesions or rash noted.  Cerumen impaction removal  Date/Time: 11/18/2019 1:32 PM Performed by: Rozetta Nunnery, MD Authorized by: Rozetta Nunnery, MD   Consent:    Consent obtained:  Verbal   Consent given by:  Patient   Risks discussed:  Pain and bleeding Procedure details:    Location:  L ear and R ear   Procedure type: suction   Post-procedure details:    Inspection:  TM intact and canal normal   Hearing quality:  Improved   Patient tolerance of procedure:  Tolerated well, no immediate complications Comments:     Patient with minimal wax buildup that was cleaned with a suction.  TMs are clear bilaterally.  Audiogram demonstrated bilateral sensorineural hearing loss worse on the right side compared to the left which is slightly asymmetric.  SRT's were 60 dB on the right and 45 dB on the left.  Assessment: Minimal wax buildup Moderate severe bilateral sensorineural hearing loss  Plan: Discussed with Timmothy Sours as well as his wife concerning need for hearing aids.   Radene Journey, MD

## 2019-11-19 LAB — BASIC METABOLIC PANEL
BUN/Creatinine Ratio: 15 (ref 10–24)
BUN: 23 mg/dL (ref 8–27)
CO2: 28 mmol/L (ref 20–29)
Calcium: 9.5 mg/dL (ref 8.6–10.2)
Chloride: 100 mmol/L (ref 96–106)
Creatinine, Ser: 1.53 mg/dL — ABNORMAL HIGH (ref 0.76–1.27)
GFR calc Af Amer: 47 mL/min/{1.73_m2} — ABNORMAL LOW (ref >59)
GFR calc non Af Amer: 41 mL/min/{1.73_m2} — ABNORMAL LOW (ref >59)
Glucose: 147 mg/dL — ABNORMAL HIGH (ref 65–99)
Potassium: 5 mmol/L (ref 3.5–5.2)
Sodium: 138 mmol/L (ref 134–144)

## 2019-11-19 NOTE — Progress Notes (Signed)
Messaged Mr. Rodas through the mychart message earlier, potassium normalized, renal function very stable.

## 2019-11-28 ENCOUNTER — Other Ambulatory Visit: Payer: Self-pay | Admitting: Internal Medicine

## 2019-11-29 ENCOUNTER — Ambulatory Visit: Payer: Medicare PPO | Admitting: Physician Assistant

## 2019-12-16 ENCOUNTER — Ambulatory Visit: Payer: Medicare PPO | Admitting: Dermatology

## 2019-12-16 ENCOUNTER — Encounter: Payer: Self-pay | Admitting: Dermatology

## 2019-12-16 ENCOUNTER — Other Ambulatory Visit: Payer: Self-pay

## 2019-12-16 DIAGNOSIS — D0439 Carcinoma in situ of skin of other parts of face: Secondary | ICD-10-CM | POA: Diagnosis not present

## 2019-12-16 DIAGNOSIS — D0462 Carcinoma in situ of skin of left upper limb, including shoulder: Secondary | ICD-10-CM

## 2019-12-16 DIAGNOSIS — L570 Actinic keratosis: Secondary | ICD-10-CM

## 2019-12-16 DIAGNOSIS — D099 Carcinoma in situ, unspecified: Secondary | ICD-10-CM

## 2019-12-16 DIAGNOSIS — D485 Neoplasm of uncertain behavior of skin: Secondary | ICD-10-CM

## 2019-12-16 NOTE — Patient Instructions (Signed)

## 2019-12-21 ENCOUNTER — Telehealth: Payer: Self-pay | Admitting: Dermatology

## 2019-12-21 NOTE — Telephone Encounter (Signed)
Phone call to patient to inform him that his pathology results aren't back yet.  Message left with patient's wife with this information.

## 2019-12-21 NOTE — Telephone Encounter (Signed)
Patient is calling for pathology results from his last visit with Stuart Tafeen, MD. 

## 2019-12-22 ENCOUNTER — Telehealth: Payer: Self-pay | Admitting: *Deleted

## 2019-12-22 NOTE — Telephone Encounter (Signed)
-----   Message from Lavonna Monarch, MD sent at 12/22/2019  7:20 AM EST ----- 2 skin cancers treated at the time of biopsy; it heals smooth, routine check 6 months.

## 2019-12-22 NOTE — Telephone Encounter (Signed)
Left message for patient to call us back.  

## 2019-12-23 ENCOUNTER — Telehealth: Payer: Self-pay | Admitting: *Deleted

## 2019-12-23 MED ORDER — MUPIROCIN 2 % EX OINT: 1.0000 "application " | TOPICAL_OINTMENT | Freq: Two times a day (BID) | CUTANEOUS | 0 refills | Status: DC

## 2019-12-23 NOTE — Telephone Encounter (Signed)
pathology to patient, patient states its a little red and irritated, no fever or drainage.  Verbal given by Dr tafeen for patient to use Mupirocin ointment.  Sent to patient pharmacy.

## 2019-12-23 NOTE — Telephone Encounter (Signed)
-----   Message from Lavonna Monarch, MD sent at 12/22/2019  7:20 AM EST ----- 2 skin cancers treated at the time of biopsy; it heals smooth, routine check 6 months.

## 2019-12-31 ENCOUNTER — Ambulatory Visit (INDEPENDENT_AMBULATORY_CARE_PROVIDER_SITE_OTHER): Payer: Medicare PPO

## 2019-12-31 DIAGNOSIS — I442 Atrioventricular block, complete: Secondary | ICD-10-CM | POA: Diagnosis not present

## 2019-12-31 LAB — CUP PACEART REMOTE DEVICE CHECK
Battery Remaining Longevity: 135 mo
Battery Voltage: 3.02 V
Brady Statistic AP VP Percent: 37.71 %
Brady Statistic AP VS Percent: 0 %
Brady Statistic AS VP Percent: 62.08 %
Brady Statistic AS VS Percent: 0.2 %
Brady Statistic RA Percent Paced: 37.71 %
Brady Statistic RV Percent Paced: 99.79 %
Date Time Interrogation Session: 20211119055114
Implantable Lead Implant Date: 20200306
Implantable Lead Implant Date: 20200306
Implantable Lead Location: 753859
Implantable Lead Location: 753860
Implantable Lead Model: 5076
Implantable Lead Model: 5076
Implantable Pulse Generator Implant Date: 20200306
Lead Channel Impedance Value: 323 Ohm
Lead Channel Impedance Value: 361 Ohm
Lead Channel Impedance Value: 399 Ohm
Lead Channel Impedance Value: 494 Ohm
Lead Channel Pacing Threshold Amplitude: 0.375 V
Lead Channel Pacing Threshold Amplitude: 0.5 V
Lead Channel Pacing Threshold Pulse Width: 0.4 ms
Lead Channel Pacing Threshold Pulse Width: 0.4 ms
Lead Channel Sensing Intrinsic Amplitude: 1.375 mV
Lead Channel Sensing Intrinsic Amplitude: 1.375 mV
Lead Channel Sensing Intrinsic Amplitude: 9.875 mV
Lead Channel Sensing Intrinsic Amplitude: 9.875 mV
Lead Channel Setting Pacing Amplitude: 1.5 V
Lead Channel Setting Pacing Amplitude: 2.5 V
Lead Channel Setting Pacing Pulse Width: 0.4 ms
Lead Channel Setting Sensing Sensitivity: 0.6 mV

## 2020-01-03 NOTE — Progress Notes (Signed)
Remote pacemaker transmission.   

## 2020-01-06 ENCOUNTER — Encounter: Payer: Self-pay | Admitting: Dermatology

## 2020-01-06 NOTE — Progress Notes (Signed)
   Follow-Up Visit   Subjective  Gary [name] is a 86 y.o. male who presents for the following: Follow-up (right hand , left cheek scale).  New/recurrent crusts Location: Left hand and face Duration:  Quality:  Associated Signs/Symptoms: Modifying Factors:  Severity:  Timing: Context: History of multiple skin cancers  Objective  Well appearing patient in no apparent distress; mood and affect are within normal limits.  A focused examination was performed including Head, neck, arms, hands.. Relevant physical exam findings are noted in the Assessment and Plan.   Assessment & Plan    Carcinoma in situ, unspecified site (3) Left 2nd Finger  Destruction of lesion Complexity: simple   Destruction method: electrodesiccation and curettage   Informed consent: discussed and consent obtained   Timeout:  patient name, date of birth, surgical site, and procedure verified Anesthesia: the lesion was anesthetized in a standard fashion   Anesthetic:  1% lidocaine w/ epinephrine 1-100,000 local infiltration Curettage performed in three different directions: Yes   Curettage cycles:  3 Lesion length (cm):  1.5 Lesion width (cm):  1 Margin per side (cm):  0 Final wound size (cm):  1.5 Hemostasis achieved with:  ferric subsulfate Outcome: patient tolerated procedure well with no complications   Post-procedure details: sterile dressing applied and wound care instructions given   Dressing type: bandage and petrolatum   Additional details:  Wound inoculated with 5% Fluorouracil.    Specimen 1 - Surgical pathology Differential Diagnosis: bcc scc Check Margins: No Tx with bx  Left cheek  Skin / nail biopsy Type of biopsy: tangential   Informed consent: discussed and consent obtained   Timeout: patient name, date of birth, surgical site, and procedure verified   Procedure prep:  Patient was prepped and draped in usual sterile fashion Prep type:  Chlorhexidine Anesthesia: the lesion was  anesthetized in a standard fashion   Anesthetic:  1% lidocaine w/ epinephrine 1-100,000 local infiltration Instrument used: flexible razor blade   Hemostasis achieved with: aluminum chloride   Outcome: patient tolerated procedure well   Post-procedure details: wound care instructions given   Additional details:  PERFORMED ED&C AFTER BIOPSY   Specimen 2 - Surgical pathology Differential Diagnosis: bcc scc Check Margins: No Tx with bx   Right Temple  Destruction of lesion Complexity: simple   Destruction method: electrodesiccation and curettage   Informed consent: discussed and consent obtained   Timeout:  patient name, date of birth, surgical site, and procedure verified Anesthesia: the lesion was anesthetized in a standard fashion   Anesthetic:  1% lidocaine w/ epinephrine 1-100,000 local infiltration Curettage performed in three different directions: Yes   Curettage cycles:  3 Lesion length (cm):  2 Lesion width (cm):  1 Margin per side (cm):  0 Final wound size (cm):  2 Hemostasis achieved with:  ferric subsulfate Outcome: patient tolerated procedure well with no complications   Post-procedure details: sterile dressing applied and wound care instructions given   Dressing type: bandage and petrolatum   Additional details:  Wound inoculated with 5% Fluorouracil.    Specimen 3 - Surgical pathology Differential Diagnosis: bcc scc Check Margins: No Tx with bx       I, Lavonna Monarch, MD, have reviewed all documentation for this visit.  The documentation on 01/06/20 for the exam, diagnosis, procedures, and orders are all accurate and complete.

## 2020-01-08 ENCOUNTER — Other Ambulatory Visit: Payer: Self-pay | Admitting: Nurse Practitioner

## 2020-01-08 DIAGNOSIS — Z8669 Personal history of other diseases of the nervous system and sense organs: Secondary | ICD-10-CM

## 2020-01-10 NOTE — Telephone Encounter (Signed)
Pregabalin refill

## 2020-01-14 DIAGNOSIS — G894 Chronic pain syndrome: Secondary | ICD-10-CM | POA: Diagnosis not present

## 2020-01-14 DIAGNOSIS — I10 Essential (primary) hypertension: Secondary | ICD-10-CM | POA: Diagnosis not present

## 2020-01-15 ENCOUNTER — Other Ambulatory Visit: Payer: Self-pay | Admitting: Nurse Practitioner

## 2020-01-15 DIAGNOSIS — Z8669 Personal history of other diseases of the nervous system and sense organs: Secondary | ICD-10-CM

## 2020-01-17 DIAGNOSIS — I252 Old myocardial infarction: Secondary | ICD-10-CM | POA: Diagnosis not present

## 2020-01-17 DIAGNOSIS — N400 Enlarged prostate without lower urinary tract symptoms: Secondary | ICD-10-CM | POA: Diagnosis not present

## 2020-01-17 DIAGNOSIS — E1142 Type 2 diabetes mellitus with diabetic polyneuropathy: Secondary | ICD-10-CM | POA: Diagnosis not present

## 2020-01-17 DIAGNOSIS — I2510 Atherosclerotic heart disease of native coronary artery without angina pectoris: Secondary | ICD-10-CM | POA: Diagnosis not present

## 2020-01-17 DIAGNOSIS — E785 Hyperlipidemia, unspecified: Secondary | ICD-10-CM | POA: Diagnosis not present

## 2020-01-17 DIAGNOSIS — M1990 Unspecified osteoarthritis, unspecified site: Secondary | ICD-10-CM | POA: Diagnosis not present

## 2020-01-17 DIAGNOSIS — I130 Hypertensive heart and chronic kidney disease with heart failure and stage 1 through stage 4 chronic kidney disease, or unspecified chronic kidney disease: Secondary | ICD-10-CM | POA: Diagnosis not present

## 2020-01-17 DIAGNOSIS — Z79891 Long term (current) use of opiate analgesic: Secondary | ICD-10-CM | POA: Diagnosis not present

## 2020-01-17 DIAGNOSIS — R269 Unspecified abnormalities of gait and mobility: Secondary | ICD-10-CM | POA: Diagnosis not present

## 2020-01-17 DIAGNOSIS — G8929 Other chronic pain: Secondary | ICD-10-CM | POA: Diagnosis not present

## 2020-01-17 DIAGNOSIS — R32 Unspecified urinary incontinence: Secondary | ICD-10-CM | POA: Diagnosis not present

## 2020-01-17 DIAGNOSIS — F32A Depression, unspecified: Secondary | ICD-10-CM | POA: Diagnosis not present

## 2020-01-17 DIAGNOSIS — E1122 Type 2 diabetes mellitus with diabetic chronic kidney disease: Secondary | ICD-10-CM | POA: Diagnosis not present

## 2020-01-17 DIAGNOSIS — Z950 Presence of cardiac pacemaker: Secondary | ICD-10-CM | POA: Diagnosis not present

## 2020-01-17 DIAGNOSIS — N1832 Chronic kidney disease, stage 3b: Secondary | ICD-10-CM | POA: Diagnosis not present

## 2020-01-17 DIAGNOSIS — E1143 Type 2 diabetes mellitus with diabetic autonomic (poly)neuropathy: Secondary | ICD-10-CM | POA: Diagnosis not present

## 2020-01-17 DIAGNOSIS — D6869 Other thrombophilia: Secondary | ICD-10-CM | POA: Diagnosis not present

## 2020-01-17 DIAGNOSIS — I509 Heart failure, unspecified: Secondary | ICD-10-CM | POA: Diagnosis not present

## 2020-01-17 DIAGNOSIS — I4891 Unspecified atrial fibrillation: Secondary | ICD-10-CM | POA: Diagnosis not present

## 2020-02-03 DIAGNOSIS — H2512 Age-related nuclear cataract, left eye: Secondary | ICD-10-CM | POA: Diagnosis not present

## 2020-02-03 DIAGNOSIS — E119 Type 2 diabetes mellitus without complications: Secondary | ICD-10-CM | POA: Diagnosis not present

## 2020-02-03 DIAGNOSIS — H5010 Unspecified exotropia: Secondary | ICD-10-CM | POA: Diagnosis not present

## 2020-02-03 DIAGNOSIS — Z961 Presence of intraocular lens: Secondary | ICD-10-CM | POA: Diagnosis not present

## 2020-02-03 DIAGNOSIS — H40033 Anatomical narrow angle, bilateral: Secondary | ICD-10-CM | POA: Diagnosis not present

## 2020-02-03 DIAGNOSIS — H53031 Strabismic amblyopia, right eye: Secondary | ICD-10-CM | POA: Diagnosis not present

## 2020-02-03 DIAGNOSIS — Z9889 Other specified postprocedural states: Secondary | ICD-10-CM | POA: Diagnosis not present

## 2020-02-07 DIAGNOSIS — M545 Low back pain, unspecified: Secondary | ICD-10-CM | POA: Diagnosis not present

## 2020-02-10 ENCOUNTER — Ambulatory Visit: Payer: Medicare PPO | Admitting: Physician Assistant

## 2020-02-13 ENCOUNTER — Other Ambulatory Visit: Payer: Self-pay | Admitting: Nurse Practitioner

## 2020-02-25 DIAGNOSIS — I10 Essential (primary) hypertension: Secondary | ICD-10-CM | POA: Diagnosis not present

## 2020-02-25 DIAGNOSIS — F419 Anxiety disorder, unspecified: Secondary | ICD-10-CM | POA: Diagnosis not present

## 2020-02-25 DIAGNOSIS — G894 Chronic pain syndrome: Secondary | ICD-10-CM | POA: Diagnosis not present

## 2020-02-25 DIAGNOSIS — I480 Paroxysmal atrial fibrillation: Secondary | ICD-10-CM | POA: Diagnosis not present

## 2020-03-10 DIAGNOSIS — I10 Essential (primary) hypertension: Secondary | ICD-10-CM | POA: Diagnosis not present

## 2020-03-10 DIAGNOSIS — I480 Paroxysmal atrial fibrillation: Secondary | ICD-10-CM | POA: Diagnosis not present

## 2020-03-10 DIAGNOSIS — F419 Anxiety disorder, unspecified: Secondary | ICD-10-CM | POA: Diagnosis not present

## 2020-03-10 DIAGNOSIS — Z950 Presence of cardiac pacemaker: Secondary | ICD-10-CM | POA: Diagnosis not present

## 2020-03-10 DIAGNOSIS — G894 Chronic pain syndrome: Secondary | ICD-10-CM | POA: Diagnosis not present

## 2020-03-16 ENCOUNTER — Ambulatory Visit: Payer: Medicare PPO | Admitting: Physician Assistant

## 2020-03-22 DIAGNOSIS — G4733 Obstructive sleep apnea (adult) (pediatric): Secondary | ICD-10-CM | POA: Diagnosis not present

## 2020-03-30 ENCOUNTER — Ambulatory Visit: Payer: Medicare PPO | Admitting: Physician Assistant

## 2020-03-31 ENCOUNTER — Ambulatory Visit (INDEPENDENT_AMBULATORY_CARE_PROVIDER_SITE_OTHER): Payer: Medicare PPO

## 2020-03-31 DIAGNOSIS — I442 Atrioventricular block, complete: Secondary | ICD-10-CM

## 2020-03-31 LAB — CUP PACEART REMOTE DEVICE CHECK
Battery Remaining Longevity: 128 mo
Battery Voltage: 3.01 V
Brady Statistic AP VP Percent: 37.41 %
Brady Statistic AP VS Percent: 0 %
Brady Statistic AS VP Percent: 62.45 %
Brady Statistic AS VS Percent: 0.14 %
Brady Statistic RA Percent Paced: 37.32 %
Brady Statistic RV Percent Paced: 99.86 %
Date Time Interrogation Session: 20220217232821
Implantable Lead Implant Date: 20200306
Implantable Lead Implant Date: 20200306
Implantable Lead Location: 753859
Implantable Lead Location: 753860
Implantable Lead Model: 5076
Implantable Lead Model: 5076
Implantable Pulse Generator Implant Date: 20200306
Lead Channel Impedance Value: 323 Ohm
Lead Channel Impedance Value: 361 Ohm
Lead Channel Impedance Value: 380 Ohm
Lead Channel Impedance Value: 456 Ohm
Lead Channel Pacing Threshold Amplitude: 0.375 V
Lead Channel Pacing Threshold Amplitude: 0.5 V
Lead Channel Pacing Threshold Pulse Width: 0.4 ms
Lead Channel Pacing Threshold Pulse Width: 0.4 ms
Lead Channel Sensing Intrinsic Amplitude: 1.5 mV
Lead Channel Sensing Intrinsic Amplitude: 1.5 mV
Lead Channel Sensing Intrinsic Amplitude: 9.875 mV
Lead Channel Sensing Intrinsic Amplitude: 9.875 mV
Lead Channel Setting Pacing Amplitude: 1.5 V
Lead Channel Setting Pacing Amplitude: 2.5 V
Lead Channel Setting Pacing Pulse Width: 0.4 ms
Lead Channel Setting Sensing Sensitivity: 0.6 mV

## 2020-04-04 NOTE — Progress Notes (Signed)
Remote pacemaker transmission.   

## 2020-04-07 DIAGNOSIS — R6889 Other general symptoms and signs: Secondary | ICD-10-CM | POA: Diagnosis not present

## 2020-04-07 DIAGNOSIS — G894 Chronic pain syndrome: Secondary | ICD-10-CM | POA: Diagnosis not present

## 2020-04-07 DIAGNOSIS — Z7902 Long term (current) use of antithrombotics/antiplatelets: Secondary | ICD-10-CM | POA: Diagnosis not present

## 2020-04-07 DIAGNOSIS — M1990 Unspecified osteoarthritis, unspecified site: Secondary | ICD-10-CM | POA: Diagnosis not present

## 2020-04-07 DIAGNOSIS — I10 Essential (primary) hypertension: Secondary | ICD-10-CM | POA: Diagnosis not present

## 2020-04-11 ENCOUNTER — Ambulatory Visit: Payer: Medicare PPO | Admitting: Dermatology

## 2020-04-11 ENCOUNTER — Other Ambulatory Visit: Payer: Self-pay

## 2020-04-11 ENCOUNTER — Encounter: Payer: Self-pay | Admitting: Dermatology

## 2020-04-11 DIAGNOSIS — D485 Neoplasm of uncertain behavior of skin: Secondary | ICD-10-CM

## 2020-04-11 DIAGNOSIS — L570 Actinic keratosis: Secondary | ICD-10-CM

## 2020-04-11 DIAGNOSIS — D0421 Carcinoma in situ of skin of right ear and external auricular canal: Secondary | ICD-10-CM | POA: Diagnosis not present

## 2020-04-11 DIAGNOSIS — C44229 Squamous cell carcinoma of skin of left ear and external auricular canal: Secondary | ICD-10-CM | POA: Diagnosis not present

## 2020-04-11 NOTE — Patient Instructions (Signed)

## 2020-04-13 DIAGNOSIS — N281 Cyst of kidney, acquired: Secondary | ICD-10-CM | POA: Diagnosis not present

## 2020-04-13 DIAGNOSIS — N1832 Chronic kidney disease, stage 3b: Secondary | ICD-10-CM | POA: Diagnosis not present

## 2020-04-13 DIAGNOSIS — E1122 Type 2 diabetes mellitus with diabetic chronic kidney disease: Secondary | ICD-10-CM | POA: Diagnosis not present

## 2020-04-13 DIAGNOSIS — I129 Hypertensive chronic kidney disease with stage 1 through stage 4 chronic kidney disease, or unspecified chronic kidney disease: Secondary | ICD-10-CM | POA: Diagnosis not present

## 2020-04-13 DIAGNOSIS — I5032 Chronic diastolic (congestive) heart failure: Secondary | ICD-10-CM | POA: Diagnosis not present

## 2020-04-14 ENCOUNTER — Other Ambulatory Visit: Payer: Self-pay | Admitting: Medical

## 2020-04-14 DIAGNOSIS — N1832 Chronic kidney disease, stage 3b: Secondary | ICD-10-CM

## 2020-04-14 DIAGNOSIS — I442 Atrioventricular block, complete: Secondary | ICD-10-CM

## 2020-04-14 DIAGNOSIS — E785 Hyperlipidemia, unspecified: Secondary | ICD-10-CM

## 2020-04-14 DIAGNOSIS — I10 Essential (primary) hypertension: Secondary | ICD-10-CM

## 2020-04-14 DIAGNOSIS — I2510 Atherosclerotic heart disease of native coronary artery without angina pectoris: Secondary | ICD-10-CM

## 2020-04-18 ENCOUNTER — Telehealth: Payer: Self-pay

## 2020-04-18 NOTE — Telephone Encounter (Signed)
Phone call from patient stating that he was calling for his pathology results and to state that his left ear is hurting and he wanted to know what he could take for the discomfort?  Pathology results given to patient, I also advised patient that he could take Tylenol or Ibuprofen if he's able to.  Patient aware.

## 2020-04-18 NOTE — Telephone Encounter (Signed)
-----   Message from Lavonna Monarch, MD sent at 04/17/2020  8:35 PM EST ----- Schedule surgery with Dr. Darene Lamer

## 2020-04-21 ENCOUNTER — Encounter: Payer: Self-pay | Admitting: Dermatology

## 2020-04-21 NOTE — Progress Notes (Signed)
Follow-Up Visit   Subjective  Gary [name] is a 87 y.o. male who presents for the following: Skin Problem (Patient has a lesion on the back side of the left ear, bleeds, painful, x /Lesion on left arm on crease where elbow is per patient wants froze off. Per patient has skin cancer on left pointer finger knuckle x years, appears to be spreading.).  New crusts on face, ears, hands, arms. Location:  Duration:  Quality:  Associated Signs/Symptoms: Modifying Factors:  Severity:  Timing: Context: History of multiple skin cancers.  Objective  Well appearing patient in no apparent distress; mood and affect are within normal limits. Objective  Left Posterior Auricle: 6 mm pink macule with central erosion; carcinoma versus chondrodermatitis.       Objective  Right Antitragus: 7 mm waxy crust       Objective  Left Buccal Cheek, Left Forearm - Posterior (2), Left Hand - Posterior (2), Right Buccal Cheek, Right Hand - Posterior (2): Multiple hornlike pink 2 to 5 mm crusts on hands and face.    All sun exposed areas plus back examined.   Assessment & Plan    Neoplasm of uncertain behavior of skin (2) Left Posterior Auricle  Skin / nail biopsy Type of biopsy: tangential   Informed consent: discussed and consent obtained   Timeout: patient name, date of birth, surgical site, and procedure verified   Procedure prep:  Patient was prepped and draped in usual sterile fashion (Non sterile) Prep type:  Chlorhexidine Anesthesia: the lesion was anesthetized in a standard fashion   Anesthetic:  1% lidocaine w/ epinephrine 1-100,000 local infiltration Instrument used: flexible razor blade   Hemostasis achieved with: ferric subsulfate   Outcome: patient tolerated procedure well   Post-procedure details: sterile dressing applied and wound care instructions given   Dressing type: bandage and petrolatum    Specimen 1 - Surgical pathology Differential Diagnosis: R/O BCC vs  SCC  Check Margins: No  Right Antitragus  Skin / nail biopsy Type of biopsy: tangential   Informed consent: discussed and consent obtained   Timeout: patient name, date of birth, surgical site, and procedure verified   Procedure prep:  Patient was prepped and draped in usual sterile fashion (Non sterile) Prep type:  Chlorhexidine Anesthesia: the lesion was anesthetized in a standard fashion   Anesthetic:  1% lidocaine w/ epinephrine 1-100,000 local infiltration Instrument used: flexible razor blade   Hemostasis achieved with: ferric subsulfate   Outcome: patient tolerated procedure well   Post-procedure details: sterile dressing applied and wound care instructions given   Dressing type: bandage and petrolatum    Specimen 2 - Surgical pathology Differential Diagnosis: R/O BCC vs SCC  Check Margins: No  AK (actinic keratosis) (8) Left Hand - Posterior (2); Right Hand - Posterior (2); Left Forearm - Posterior (2); Left Buccal Cheek; Right Buccal Cheek  Destruction of lesion - Left Buccal Cheek, Left Forearm - Posterior, Left Hand - Posterior, Right Buccal Cheek, Right Hand - Posterior Complexity: simple   Destruction method: cryotherapy   Informed consent: discussed and consent obtained   Timeout:  patient name, date of birth, surgical site, and procedure verified Lesion destroyed using liquid nitrogen: Yes   Cryotherapy cycles:  3 Outcome: patient tolerated procedure well with no complications   Post-procedure details: wound care instructions given        I, Lavonna Monarch, MD, have reviewed all documentation for this visit.  The documentation on 04/21/20 for the exam, diagnosis, procedures,  and orders are all accurate and complete.

## 2020-04-23 DIAGNOSIS — N401 Enlarged prostate with lower urinary tract symptoms: Secondary | ICD-10-CM | POA: Diagnosis not present

## 2020-04-23 DIAGNOSIS — M1990 Unspecified osteoarthritis, unspecified site: Secondary | ICD-10-CM | POA: Diagnosis not present

## 2020-04-23 DIAGNOSIS — N184 Chronic kidney disease, stage 4 (severe): Secondary | ICD-10-CM | POA: Diagnosis not present

## 2020-04-23 DIAGNOSIS — I272 Pulmonary hypertension, unspecified: Secondary | ICD-10-CM | POA: Diagnosis not present

## 2020-04-23 DIAGNOSIS — I129 Hypertensive chronic kidney disease with stage 1 through stage 4 chronic kidney disease, or unspecified chronic kidney disease: Secondary | ICD-10-CM | POA: Diagnosis not present

## 2020-04-23 DIAGNOSIS — E1122 Type 2 diabetes mellitus with diabetic chronic kidney disease: Secondary | ICD-10-CM | POA: Diagnosis not present

## 2020-04-23 DIAGNOSIS — E538 Deficiency of other specified B group vitamins: Secondary | ICD-10-CM | POA: Diagnosis not present

## 2020-04-23 DIAGNOSIS — I480 Paroxysmal atrial fibrillation: Secondary | ICD-10-CM | POA: Diagnosis not present

## 2020-04-23 DIAGNOSIS — G894 Chronic pain syndrome: Secondary | ICD-10-CM | POA: Diagnosis not present

## 2020-04-26 ENCOUNTER — Ambulatory Visit: Payer: Medicare PPO | Admitting: Dermatology

## 2020-04-26 DIAGNOSIS — G894 Chronic pain syndrome: Secondary | ICD-10-CM | POA: Diagnosis not present

## 2020-04-26 DIAGNOSIS — I272 Pulmonary hypertension, unspecified: Secondary | ICD-10-CM | POA: Diagnosis not present

## 2020-04-26 DIAGNOSIS — R6889 Other general symptoms and signs: Secondary | ICD-10-CM | POA: Diagnosis not present

## 2020-05-03 DIAGNOSIS — I480 Paroxysmal atrial fibrillation: Secondary | ICD-10-CM | POA: Diagnosis not present

## 2020-05-03 DIAGNOSIS — E538 Deficiency of other specified B group vitamins: Secondary | ICD-10-CM | POA: Diagnosis not present

## 2020-05-03 DIAGNOSIS — N401 Enlarged prostate with lower urinary tract symptoms: Secondary | ICD-10-CM | POA: Diagnosis not present

## 2020-05-03 DIAGNOSIS — N184 Chronic kidney disease, stage 4 (severe): Secondary | ICD-10-CM | POA: Diagnosis not present

## 2020-05-03 DIAGNOSIS — M1990 Unspecified osteoarthritis, unspecified site: Secondary | ICD-10-CM | POA: Diagnosis not present

## 2020-05-03 DIAGNOSIS — G894 Chronic pain syndrome: Secondary | ICD-10-CM | POA: Diagnosis not present

## 2020-05-03 DIAGNOSIS — I272 Pulmonary hypertension, unspecified: Secondary | ICD-10-CM | POA: Diagnosis not present

## 2020-05-03 DIAGNOSIS — I129 Hypertensive chronic kidney disease with stage 1 through stage 4 chronic kidney disease, or unspecified chronic kidney disease: Secondary | ICD-10-CM | POA: Diagnosis not present

## 2020-05-03 DIAGNOSIS — E1122 Type 2 diabetes mellitus with diabetic chronic kidney disease: Secondary | ICD-10-CM | POA: Diagnosis not present

## 2020-05-04 ENCOUNTER — Other Ambulatory Visit: Payer: Self-pay | Admitting: Dermatology

## 2020-05-04 ENCOUNTER — Ambulatory Visit (INDEPENDENT_AMBULATORY_CARE_PROVIDER_SITE_OTHER): Payer: Medicare PPO | Admitting: Dermatology

## 2020-05-04 ENCOUNTER — Telehealth: Payer: Self-pay | Admitting: *Deleted

## 2020-05-04 ENCOUNTER — Encounter: Payer: Self-pay | Admitting: Dermatology

## 2020-05-04 ENCOUNTER — Other Ambulatory Visit: Payer: Self-pay

## 2020-05-04 DIAGNOSIS — I10 Essential (primary) hypertension: Secondary | ICD-10-CM | POA: Diagnosis not present

## 2020-05-04 DIAGNOSIS — L570 Actinic keratosis: Secondary | ICD-10-CM

## 2020-05-04 DIAGNOSIS — D485 Neoplasm of uncertain behavior of skin: Secondary | ICD-10-CM

## 2020-05-04 DIAGNOSIS — D099 Carcinoma in situ, unspecified: Secondary | ICD-10-CM

## 2020-05-04 DIAGNOSIS — G894 Chronic pain syndrome: Secondary | ICD-10-CM | POA: Diagnosis not present

## 2020-05-04 DIAGNOSIS — D0421 Carcinoma in situ of skin of right ear and external auricular canal: Secondary | ICD-10-CM | POA: Diagnosis not present

## 2020-05-04 NOTE — Telephone Encounter (Signed)
Here today for treatment- per Dr Denna Haggard patient needs MOHS on Left posterior auricle.  Referral sent to skin surgery center.

## 2020-05-04 NOTE — Patient Instructions (Signed)

## 2020-05-08 ENCOUNTER — Ambulatory Visit: Payer: Medicare PPO | Admitting: Internal Medicine

## 2020-05-09 DIAGNOSIS — N401 Enlarged prostate with lower urinary tract symptoms: Secondary | ICD-10-CM | POA: Diagnosis not present

## 2020-05-09 DIAGNOSIS — I480 Paroxysmal atrial fibrillation: Secondary | ICD-10-CM | POA: Diagnosis not present

## 2020-05-09 DIAGNOSIS — E1122 Type 2 diabetes mellitus with diabetic chronic kidney disease: Secondary | ICD-10-CM | POA: Diagnosis not present

## 2020-05-09 DIAGNOSIS — I272 Pulmonary hypertension, unspecified: Secondary | ICD-10-CM | POA: Diagnosis not present

## 2020-05-09 DIAGNOSIS — G894 Chronic pain syndrome: Secondary | ICD-10-CM | POA: Diagnosis not present

## 2020-05-09 DIAGNOSIS — I129 Hypertensive chronic kidney disease with stage 1 through stage 4 chronic kidney disease, or unspecified chronic kidney disease: Secondary | ICD-10-CM | POA: Diagnosis not present

## 2020-05-09 DIAGNOSIS — E538 Deficiency of other specified B group vitamins: Secondary | ICD-10-CM | POA: Diagnosis not present

## 2020-05-09 DIAGNOSIS — N184 Chronic kidney disease, stage 4 (severe): Secondary | ICD-10-CM | POA: Diagnosis not present

## 2020-05-09 DIAGNOSIS — M1990 Unspecified osteoarthritis, unspecified site: Secondary | ICD-10-CM | POA: Diagnosis not present

## 2020-05-10 ENCOUNTER — Telehealth: Payer: Self-pay | Admitting: Dermatology

## 2020-05-10 DIAGNOSIS — M1990 Unspecified osteoarthritis, unspecified site: Secondary | ICD-10-CM | POA: Diagnosis not present

## 2020-05-10 DIAGNOSIS — N184 Chronic kidney disease, stage 4 (severe): Secondary | ICD-10-CM | POA: Diagnosis not present

## 2020-05-10 DIAGNOSIS — I272 Pulmonary hypertension, unspecified: Secondary | ICD-10-CM | POA: Diagnosis not present

## 2020-05-10 DIAGNOSIS — I129 Hypertensive chronic kidney disease with stage 1 through stage 4 chronic kidney disease, or unspecified chronic kidney disease: Secondary | ICD-10-CM | POA: Diagnosis not present

## 2020-05-10 DIAGNOSIS — E1122 Type 2 diabetes mellitus with diabetic chronic kidney disease: Secondary | ICD-10-CM | POA: Diagnosis not present

## 2020-05-10 DIAGNOSIS — G894 Chronic pain syndrome: Secondary | ICD-10-CM | POA: Diagnosis not present

## 2020-05-10 DIAGNOSIS — I480 Paroxysmal atrial fibrillation: Secondary | ICD-10-CM | POA: Diagnosis not present

## 2020-05-10 DIAGNOSIS — E538 Deficiency of other specified B group vitamins: Secondary | ICD-10-CM | POA: Diagnosis not present

## 2020-05-10 DIAGNOSIS — N401 Enlarged prostate with lower urinary tract symptoms: Secondary | ICD-10-CM | POA: Diagnosis not present

## 2020-05-10 NOTE — Telephone Encounter (Signed)
Patient is calling for pathology results from last visit with Lavonna Monarch, MD and to say that he has gotten his appointment information from [facility].

## 2020-05-10 NOTE — Telephone Encounter (Signed)
Phone call to patient to inform him that his biopsy results aren't back yet.  Patient's wife aware.

## 2020-05-11 ENCOUNTER — Telehealth (INDEPENDENT_AMBULATORY_CARE_PROVIDER_SITE_OTHER): Payer: Medicare PPO | Admitting: Internal Medicine

## 2020-05-11 ENCOUNTER — Encounter: Payer: Self-pay | Admitting: Internal Medicine

## 2020-05-11 VITALS — BP 130/68 | HR 74 | Wt 208.0 lb

## 2020-05-11 DIAGNOSIS — I77810 Thoracic aortic ectasia: Secondary | ICD-10-CM | POA: Diagnosis not present

## 2020-05-11 DIAGNOSIS — I340 Nonrheumatic mitral (valve) insufficiency: Secondary | ICD-10-CM

## 2020-05-11 DIAGNOSIS — N1832 Chronic kidney disease, stage 3b: Secondary | ICD-10-CM

## 2020-05-11 DIAGNOSIS — I129 Hypertensive chronic kidney disease with stage 1 through stage 4 chronic kidney disease, or unspecified chronic kidney disease: Secondary | ICD-10-CM

## 2020-05-11 DIAGNOSIS — Z950 Presence of cardiac pacemaker: Secondary | ICD-10-CM

## 2020-05-11 DIAGNOSIS — R0789 Other chest pain: Secondary | ICD-10-CM | POA: Diagnosis not present

## 2020-05-11 DIAGNOSIS — E785 Hyperlipidemia, unspecified: Secondary | ICD-10-CM | POA: Diagnosis not present

## 2020-05-11 DIAGNOSIS — R0602 Shortness of breath: Secondary | ICD-10-CM

## 2020-05-11 NOTE — Patient Instructions (Signed)
Medication Instructions:  Your physician recommends that you continue on your current medications as directed. Please refer to the Current Medication list given to you today.  *If you need a refill on your cardiac medications before your next appointment, please call your pharmacy*   Lab Work: NONE If you have labs (blood work) drawn today and your tests are completely normal, you will receive your results only by: Marland Kitchen MyChart Message (if you have MyChart) OR . A paper copy in the mail If you have any lab test that is abnormal or we need to change your treatment, we will call you to review the results.   Testing/Procedures: Your physician has requested that you have an echocardiogram. Echocardiography is a painless test that uses sound waves to create images of your heart. It provides your doctor with information about the size and shape of your heart and how well your heart's chambers and valves are working. This procedure takes approximately one hour. There are no restrictions for this procedure. -- [address] - [address] Floor   Follow-Up: At [facility], you and your health needs are our priority.  As part of our continuing mission to provide you with exceptional heart care, we have created designated Provider Care Teams.  These Care Teams include your primary Cardiologist (physician) and Advanced Practice Providers (APPs -  Physician Assistants and Nurse Practitioners) who all work together to provide you with the care you need, when you need it.  We recommend signing up for the patient portal called "MyChart".  Sign up information is provided on this After Visit Summary.  MyChart is used to connect with patients for Virtual Visits (Telemedicine).  Patients are able to view lab/test results, encounter notes, upcoming appointments, etc.  Non-urgent messages can be sent to your provider as well.   To learn more about what you can do with MyChart, go to [email or web address].     Your next appointment:   6 month(s)  The format for your next appointment:   In Person  Provider:   You may see Pixie Casino, MD or one of the following Advanced Practice Providers on your designated Care Team:    Almyra Deforest, PA-C  Fabian Sharp, PA-C or   Roby Lofts, [place]    Other Instructions

## 2020-05-11 NOTE — Progress Notes (Signed)
Virtual Visit via Video Note   This visit type was conducted due to national recommendations for restrictions regarding the COVID-19 Pandemic (e.g. social distancing) in an effort to limit this patient's exposure and mitigate transmission in our community.  Due to his co-morbid illnesses, this patient is at least at moderate risk for complications without adequate follow up.  This format is felt to be most appropriate for this patient at this time.  The patient does have access to video technology.  All issues noted in this document were discussed and addressed. Limited physical exam could be performed with this format.  Please refer to the patient's chart for his  consent to telehealth for [facility].   Evaluation Performed:  Video follow-up   Date:  05/11/2020   ID:  Gary [name], DOB [birth year], MRN [id number]  Patient Location:  [address] [name] [address]  Provider location:   [address], [place] [address] [place], [place] [address]  PCP:  Clovia Cuff, MD  Cardiologist:  Pixie Casino, MD Electrophysiologist:  Virl Axe, MD   Chief Complaint:  No complaints  History of Present Illness:    Gary [name] is a 87 y.o. male who presents via audio/video conferencing for a telehealth visit today.  Gary [name] returns today for follow-up.  He was recently hospitalized for atypical chest pain.  He underwent repeat nuclear stress testing after negative troponins.  This demonstrated no reversible ischemia however global hypokinesis with an EF of 44%.  Subsequently he was noted to be persistently hypertensive.  He was seen by Roby Lofts, PA-C, who started him on carvedilol in addition to amlodipine.  Blood pressure is now much better controlled today as his heart rate.  He feels well.  He did have a repeat echo yesterday to evaluate his LV dysfunction on Myoview testing.  The echo demonstrated normal LV function, however he did have a dilated aortic root to 4.2 cm,  some mild mitral stenosis and regurgitation as well as some aortic insufficiency.  This will need follow-up.  05/11/2020  Gary [name] was seen today via video chat.  Overall he says he is doing fairly well although has been struggling with multiple skin cancers.  He is followed by Dr. Denna Haggard.  He had stress testing about a year ago when he was hospitalized with atypical chest pain symptoms.  This showed 2 fixed defects with mildly reduced LV function however no significant reversible ischemia.  He has had no significant issues since then except this past fall when he was seen by Almyra Deforest, PA-C for some dizziness.  That seems to have improved.  He does have some chronic kidney disease with a baseline creatinine of 1.6 which is likely CKD 3B.  He takes Lasix 20 mg 3 times weekly.  His last echo showed normal LV function with a dilated aortic root at 4.2 cm.  There was increased LV filling pressure.  He did have some mitral stenosis and regurgitation with severe left atrial enlargement.  He reports that he has been recently a little more short of breath.  He wonders if this may be due to deconditioning.   The patient does not have symptoms concerning for COVID-19 infection (fever, chills, cough, or new SHORTNESS OF BREATH).    Prior CV studies:   The following studies were reviewed today:  Reviewed  PMHx:  Past Medical History:  Diagnosis Date  . Adenomatous colon polyp    2010   . Anemia   .  Anxiety   . Arthritis    "hips and lower back"  . Arthritis pain   . Atypical mole 06/21/2003   left neck, inferior - slight to moderate atypia  . Basal cell carcinoma 10/04/2014   right shin bcc (tx cx3, 32fu )  . Blood transfusion   . Chest pain 03/29/11   2D Echo without contrast on 03/29/11 - EF= 55-60%.  . Cholecystitis   . Chronic back pain greater than 3 months duration   . Chronic kidney disease   . Colon polyps   . Complication of anesthesia    once slow to wake up  . Coronary angioplasty  status 2011  . Coronary artery disease   . Diarrhea   . Diverticulosis   . Dyslipidemia   . GERD (gastroesophageal reflux disease)   . GI bleeding after 07/2009   "triggered by Plavix & ASA; from my diverticulosis"  . H/O Clostridium difficile infection   . H/O myocardial perfusion scan 04/04/11   For Pre-noncardiac surgery - EF = 67%, no ischemia, and is considered low risk.  Marland Kitchen Heart attack ([place]) 07/2009   nonSTEMI with an occluded circumflex artery and collaterals.  Marland Kitchen Heart murmur   . Hemorrhoids   . History of shingles   . Hyperlipidemia   . Hypertension   . Kidney stones   . Lower extremity edema    Taking furosemide and it seems to be helping.  . Neuromuscular disorder ([place])   . Nonspecific chest pain 08/26/2012   Went to ED 08/26/12  . Squamous cell carcinoma of skin 05/13/2000   Right outer forehead  . Squamous cell carcinoma of skin 03/02/2001   Post crown - tx 03/19/2001  . Squamous cell carcinoma of skin 09/01/2001   Left post auricular - tx 10/09/2001  . Squamous cell carcinoma of skin 06/21/2003   Top right ear - tx 07/05/2003  . Squamous cell carcinoma of skin 06/21/2003   Right upper arm - tx 07/05/2003  . Squamous cell carcinoma of skin 10/12/2003   Left superior ear lobe - MOHs  . Squamous cell carcinoma of skin 12/15/2006   Right ear, superior - tx 01/26/2007  . Squamous cell carcinoma of skin 03/13/2009   Right temple - tx 05/08/2009  . Squamous cell carcinoma of skin 03/24/2013   Upper left forearm - tx p bx  . Squamous cell carcinoma of skin 12/06/2013   Right forearm - CX3 + 5FU  . Squamous cell carcinoma of skin 02/10/2014   Right hand - tx p bx  . Squamous cell carcinoma of skin 10/04/2014   Right shin - tx p bx  . Squamous cell carcinoma of skin 08/21/2016   Left hand - tx p bx  . Squamous cell carcinoma of skin 01/01/2017   Right hand - tx 02/06/2017  . Squamous cell carcinoma of skin 10/21/2017   Left side of scalp - tx 03/12/2018  . Squamous  cell carcinoma of skin 10/21/2017   Left post scalp, inf - tx 08/13/2018  . Squamous cell carcinoma of skin 10/21/2017   Right scalp - tx p bx  . Squamous cell carcinoma of skin 10/21/2017   Left cheek - tx 03/12/2018  . Squamous cell carcinoma of skin 08/13/2018   Left top hand, proximal pinky - tx p bx  . Squamous cell carcinoma of skin 12/23/2018   Right bicep lower - tx p bx  . Squamous cell carcinoma of skin 05/18/2019   Right 4th finger metacarpophalangeal joint  .  Squamous cell carcinoma of skin 05/18/2019   Left 5th finger metacarpophalangeal joint  . Squamous cell carcinoma of skin 05/18/2019   Left 2nd finger metacarpophalangeal joint  . Stomach problems   . Syncope and collapse 04/16/2018    Past Surgical History:  Procedure Laterality Date  . BACK SURGERY  1997  . CARDIAC CATHETERIZATION  08/09/2009   Circumflex 100% occluded with right-to-left collaterals, mild irregularities in the proximal and distal LAD and diagonal system, normal LV systolic function, and minor infrarenal irregularities, but no abdominal aortic aneurysm.  . CHOLECYSTECTOMY  04/12/2011   Procedure: CHOLECYSTECTOMY;  Surgeon: Adin Hector, MD;  Location: [facility];  Service: General;  Laterality: N/A;  . CHOLECYSTECTOMY  04/12/2011   Procedure: LAPAROSCOPIC CHOLECYSTECTOMY;  Surgeon: Adin Hector, MD;  Location: [facility];  Service: General;  Laterality: N/A;  converted to open  . COLON SURGERY     COLONOSCOPY  . ESOPHAGOGASTRODUODENOSCOPY N/A 08/05/2013   Procedure: ESOPHAGOGASTRODUODENOSCOPY (EGD);  Surgeon: Jerene Bears, MD;  Location: [facility];  Service: Gastroenterology;  Laterality: N/A;  . EYE SURGERY  1942   "right eye was crossed; they  corrected it"  . LUMBAR FUSION  07/22/2012  . PACEMAKER IMPLANT N/A 04/17/2018   Procedure: PACEMAKER IMPLANT;  Surgeon: Deboraha Sprang, MD;  Location: [facility];  Service: Cardiovascular;  Laterality: N/A;  . POSTERIOR FUSION LUMBAR SPINE  08/1995    "bottom 4 vertebra"  . SUBTOTAL COLECTOMY  01/24/2010  . TOTAL HIP ARTHROPLASTY Right 08/03/2013   Procedure: TOTAL HIP ARTHROPLASTY ANTERIOR APPROACH;  Surgeon: Hessie Dibble, MD;  Location: [facility];  Service: Orthopedics;  Laterality: Right;    FAMHx:  Family History  Problem Relation Age of Onset  . Heart disease Father   . Stroke Father   . Pancreatic cancer Mother   . Diverticulosis Sister        x4  . Colon cancer Neg Hx     SOCHx:   reports that he has never smoked. He has never used smokeless tobacco. He reports that he does not drink alcohol and does not use drugs.  ALLERGIES:  Allergies  Allergen Reactions  . Naproxen Rash  . Clopidogrel Bisulfate Other (See Comments)    H/O diverticulitis; prone to rectal bleeding.  Plavix complicated this.  jkl  . Sulfonamide Derivatives Hives  . Other Other (See Comments)    Narcotics-C. Diff  . Latex Rash  . Tape Itching and Rash    Latex-Adhesive tape    MEDS:  Current Meds  Medication Sig  . acetaminophen (TYLENOL) 500 MG tablet Take 1,000 mg by mouth every 8 (eight) hours as needed for fever.  Marland Kitchen amLODipine (NORVASC) 5 MG tablet TAKE 1 TABLET BY MOUTH EVERY DAY  . aspirin EC 81 MG tablet Take 81 mg by mouth daily.  . blood glucose meter kit and supplies KIT Dispense based on patient and insurance preference. Use up to four times daily as directed. (FOR ICD-9 250.00, 250.01).  . carvedilol (COREG) 3.125 MG tablet TAKE 1 TABLET (3.125 MG TOTAL) BY MOUTH 2 (TWO) TIMES DAILY WITH A MEAL.  . citalopram (CELEXA) 40 MG tablet Take 1 tablet (40 mg total) by mouth daily.  . diclofenac Sodium (VOLTAREN) 1 % GEL diclofenac 1 % topical gel  APPLY 2 G TO AFFECTED AREA 4 TIMES A DAY  . doxazosin (CARDURA) 4 MG tablet Take 4 mg by mouth daily.  . ergocalciferol (VITAMIN D2) 1.25 MG (50000 UT) capsule ergocalciferol (  vitamin D2) 1,250 mcg (50,000 unit) capsule  TAKE 1 CAPSULE BY MOUTH ONE TIME PER WEEK  . fluticasone (FLONASE) 50  MCG/ACT nasal spray Place 2 sprays into both nostrils daily.  . furosemide (LASIX) 20 MG tablet Take 20 mg by mouth. Monday,wednesday and friday  . mupirocin ointment (BACTROBAN) 2 % Apply 1 application topically 2 (two) times daily.  . nitroGLYCERIN (NITROSTAT) 0.4 MG SL tablet PLACE 1 TABLET UNDER THE TONGUE EVERY 5 MINUTES AS NEEDED FOR CHEST PAIN FOR 3 DOSES (Patient taking differently: Place 0.4 mg under the tongue every 5 (five) minutes as needed for chest pain.)  . pregabalin (LYRICA) 75 MG capsule Take 1 capsule (75 mg total) by mouth 3 (three) times daily.  . simvastatin (ZOCOR) 40 MG tablet TAKE 1 TABLET BY MOUTH AT BEDTIME (Patient taking differently: Take 40 mg by mouth at bedtime.)  . Tamsulosin HCl (FLOMAX) 0.4 MG CAPS Take 0.4 mg by mouth daily after supper.  . traMADol (ULTRAM) 50 MG tablet   . vitamin B-12 1000 MCG tablet Take 1 tablet (1,000 mcg total) by mouth daily.     ROS: Pertinent items noted in HPI and remainder of comprehensive ROS otherwise negative.  Labs/Other Tests and Data Reviewed:    Recent Labs: 11/03/2019: ALT 13; Hemoglobin 13.0; Platelets 173 11/18/2019: BUN 23; Creatinine, Ser 1.53; Potassium 5.0; Sodium 138   Recent Lipid Panel Lab Results  Component Value Date/Time   CHOL  08/09/2009 09:49 AM    164        ATP III CLASSIFICATION:  <200     mg/dL   Desirable  200-239  mg/dL   Borderline High  >=240    mg/dL   High          TRIG 114 08/09/2009 09:49 AM   HDL 36 (L) 08/09/2009 09:49 AM   CHOLHDL 4.6 08/09/2009 09:49 AM   LDLCALC (H) 08/09/2009 09:49 AM    105        Total Cholesterol/HDL:CHD Risk Coronary Heart Disease Risk Table                     Men   Women  1/2 Average Risk   3.4   3.3  Average Risk       5.0   4.4  2 X Average Risk   9.6   7.1  3 X Average Risk  23.4   11.0        Use the calculated Patient Ratio above and the CHD Risk Table to determine the patient's CHD Risk.        ATP III CLASSIFICATION (LDL):  <100      mg/dL   Optimal  100-129  mg/dL   Near or Above                    Optimal  130-159  mg/dL   Borderline  160-189  mg/dL   High  >190     mg/dL   Very High    Wt Readings from Last 3 Encounters:  05/11/20 208 lb (94.3 kg)  11/03/19 208 lb 9.6 oz (94.6 kg)  07/07/19 204 lb (92.5 kg)     Exam:    Vital Signs:  BP 130/68   Pulse 74   Wt 208 lb (94.3 kg)   BMI 34.61 kg/m    General appearance: alert and no distress Lungs: No visual respiratory difficulty Abdomen: Moderately obese Extremities: extremities normal, atraumatic, no cyanosis or edema Skin:  Multiple skin lesions Neurologic: Grossly normal Psych: Pleasant  ASSESSMENT & PLAN:    1. Dyspnea on exertion 2. Atypical chest pain-low risk Myoview stress test (04/2019) 3. LVEF 55 to 60%, mild MS/MR, severe LAE 4. Dilated aortic root to 4.2 cm 5. Complete heart block status post pacemaker 6. Hypertension 7. Dyslipidemia  Gary [name] has recently had some increase in shortness of breath with exertion.  His last echo was a year ago and showed mild MS/MR with a dilated left atrium and increased LV filling pressure.  The aortic root is also dilated.  I would like to repeat that echo to reassess possible causes of his shortness of breath.  He is on rather low-dose Lasix given his chronic kidney disease, only taking the medication 3 times a week.  He is followed closely by Dr. Royce Macadamia with [facility].  Will contact him with the results of his echo and make adjustments as necessary.  Plan follow-up with me in the office in 6 months.  COVID-19 Education: The signs and symptoms of COVID-19 were discussed with the patient and how to seek care for testing (follow up with PCP or arrange E-visit).  The importance of social distancing was discussed today.  Patient Risk:   After full review of this patients clinical status, I feel that they are at least moderate risk at this time.  Time:   Today, I have spent 25 minutes with the  patient with telehealth technology discussing chest pain, echo results, hypertension, dyslipidemia.     Medication Adjustments/Labs and Tests Ordered: Current medicines are reviewed at length with the patient today.  Concerns regarding medicines are outlined above.   Tests Ordered: No orders of the defined types were placed in this encounter.   Medication Changes: No orders of the defined types were placed in this encounter.   Disposition:  in 6 month(s)  Pixie Casino, MD, [facility], [place] Director of the Advanced Lipid Disorders &  Cardiovascular Risk Reduction Clinic Diplomate of the American Board of Clinical Lipidology Attending Cardiologist  Direct Dial: [phone]  Fax: [phone]  Website:  [email or web address]  Pixie Casino, MD  05/11/2020 9:14 AM

## 2020-05-13 ENCOUNTER — Telehealth: Payer: Self-pay | Admitting: Physician Assistant

## 2020-05-13 NOTE — Telephone Encounter (Signed)
Patient paged after our answering service due to onset of a single episode of chest pain.  He describes the symptom as a sharp electrical shock that lasted about a second and went away as quickly as it came.  He is worried it might be cardiac in nature therefore called EMS.  EMS came by and they reportedly put him on the monitor.  They eventually agreed not to go to the emergency room.  He denies any shortness of breath, dizziness, feeling of passing out, or any other symptom.  His symptom is quite atypical for cardiac angina.  I think will be quite reasonable to continue to monitor.  If his symptoms does recur, he will need to give [place] a call and let us know.

## 2020-05-15 MED ORDER — FUROSEMIDE 40 MG PO TABS: 40.0000 mg | ORAL_TABLET | Freq: Every day | ORAL | Status: DC

## 2020-05-15 NOTE — Telephone Encounter (Signed)
Spoke with Gary [name], pt's wife (ok per [facility]) regarding medication changes made by Dr. Royce Macadamia on Friday and the need for additional labs. Pt was told by Dr. Royce Macadamia to increase lasix 20mg  three times a week to 40mg  once daily. Pt has seen a change in blood pressure form 148/74 to 109/60. Explained that is a good blood pressure and there is no concern with this pressure. Pt is also concerned about additional labs that Dr. Royce Macadamia wants to do this Friday. Explained that this is a routine order usually done after increasing lasix to ensure that no harm is being done to the kidneys and to check electrolyte balance.  Wife verbalizes understanding and is thankful for the call.

## 2020-05-16 ENCOUNTER — Telehealth: Payer: Self-pay | Admitting: Dermatology

## 2020-05-16 ENCOUNTER — Encounter: Payer: Self-pay | Admitting: Dermatology

## 2020-05-16 NOTE — Telephone Encounter (Signed)
Patient's wife is calling for culture results from last visit with Paulette Blanch, MD.

## 2020-05-16 NOTE — Telephone Encounter (Signed)
Phone call to patient's wife to inform her that the patient's pathology results aren't back yet.  Patient's wife aware.

## 2020-05-23 DIAGNOSIS — E1122 Type 2 diabetes mellitus with diabetic chronic kidney disease: Secondary | ICD-10-CM | POA: Diagnosis not present

## 2020-05-23 DIAGNOSIS — N184 Chronic kidney disease, stage 4 (severe): Secondary | ICD-10-CM | POA: Diagnosis not present

## 2020-05-23 DIAGNOSIS — I129 Hypertensive chronic kidney disease with stage 1 through stage 4 chronic kidney disease, or unspecified chronic kidney disease: Secondary | ICD-10-CM | POA: Diagnosis not present

## 2020-05-23 DIAGNOSIS — Z950 Presence of cardiac pacemaker: Secondary | ICD-10-CM | POA: Diagnosis not present

## 2020-05-23 DIAGNOSIS — I2510 Atherosclerotic heart disease of native coronary artery without angina pectoris: Secondary | ICD-10-CM | POA: Diagnosis not present

## 2020-05-23 DIAGNOSIS — Z9181 History of falling: Secondary | ICD-10-CM | POA: Diagnosis not present

## 2020-05-23 DIAGNOSIS — I480 Paroxysmal atrial fibrillation: Secondary | ICD-10-CM | POA: Diagnosis not present

## 2020-05-23 DIAGNOSIS — I272 Pulmonary hypertension, unspecified: Secondary | ICD-10-CM | POA: Diagnosis not present

## 2020-05-24 ENCOUNTER — Ambulatory Visit: Payer: Medicare PPO | Admitting: Dermatology

## 2020-05-25 NOTE — Progress Notes (Signed)
   Follow-Up Visit   Subjective  Gary [name] is a 87 y.o. male who presents for the following: Annual Exam (Sccx2 1. Skin , left posterior auricle/WELL DIFFERENTIATED SQUAMOUS CELL CARCINOMA, ULCERATED/2. Skin , right antitragus/SQUAMOUS CELL CARCINOMA IN SITU, HYPERTROPHIC, BASE INVOLVED, CRUSTED).  For treatment of carcinoma in situ right ear and new spot on arm Location:  Duration:  Quality:  Associated Signs/Symptoms: Modifying Factors:  Severity:  Timing: Context:   Objective  Well appearing patient in no apparent distress; mood and affect are within normal limits. Left Forearm - Posterior       Objective  Right Antitragus: Biopsy site identified by nurse and me.    A focused examination was performed including Head, neck, arms.. Relevant physical exam findings are noted in the Assessment and Plan.   Assessment & Plan    Neoplasm of uncertain behavior of skin Left Forearm - Posterior  Skin / nail biopsy Type of biopsy: tangential   Informed consent: discussed and consent obtained   Timeout: patient name, date of birth, surgical site, and procedure verified   Procedure prep:  Patient was prepped and draped in usual sterile fashion (Non sterile) Prep type:  Chlorhexidine Anesthesia: the lesion was anesthetized in a standard fashion   Anesthetic:  1% lidocaine w/ epinephrine 1-100,000 local infiltration Instrument used: flexible razor blade   Hemostasis achieved with: ferric subsulfate and electrodesiccation   Outcome: patient tolerated procedure well   Post-procedure details: sterile dressing applied and wound care instructions given   Dressing type: bandage and petrolatum    Destruction of lesion Complexity: simple   Destruction method: electrodesiccation and curettage   Informed consent: discussed and consent obtained   Timeout:  patient name, date of birth, surgical site, and procedure verified Anesthesia: the lesion was anesthetized in a standard  fashion   Anesthetic:  1% lidocaine w/ epinephrine 1-100,000 local infiltration Curettage performed in three different directions: Yes   Electrodesiccation performed over the curetted area: Yes   Curettage cycles:  1 Margin per side (cm):  0.1 Final wound size (cm):  1 Hemostasis achieved with:  ferric subsulfate Outcome: patient tolerated procedure well with no complications   Post-procedure details: sterile dressing applied and wound care instructions given   Dressing type: bandage and petrolatum    Specimen 1 - Surgical pathology Differential Diagnosis: R/O BCC vs SCC Treated after biopsy Check Margins: No  Squamous cell carcinoma in situ Right Antitragus  Destruction of lesion Complexity: simple   Destruction method: electrodesiccation and curettage   Informed consent: discussed and consent obtained   Timeout:  patient name, date of birth, surgical site, and procedure verified Anesthesia: the lesion was anesthetized in a standard fashion   Anesthetic:  1% lidocaine w/ epinephrine 1-100,000 local infiltration Curettage performed in three different directions: Yes   Electrodesiccation performed over the curetted area: Yes   Curettage cycles:  3 Lesion length (cm):  1.4 Lesion width (cm):  1.4 Margin per side (cm):  0 Final wound size (cm):  1.4 Hemostasis achieved with:  ferric subsulfate Outcome: patient tolerated procedure well with no complications   Additional details:  Wound innoculated with 5 fluorouracil solution.      I, Lavonna Monarch, MD, have reviewed all documentation for this visit.  The documentation on 05/25/20 for the exam, diagnosis, procedures, and orders are all accurate and complete.

## 2020-05-27 DIAGNOSIS — I10 Essential (primary) hypertension: Secondary | ICD-10-CM | POA: Diagnosis not present

## 2020-05-27 DIAGNOSIS — G894 Chronic pain syndrome: Secondary | ICD-10-CM | POA: Diagnosis not present

## 2020-05-27 DIAGNOSIS — I480 Paroxysmal atrial fibrillation: Secondary | ICD-10-CM | POA: Diagnosis not present

## 2020-05-27 DIAGNOSIS — R0609 Other forms of dyspnea: Secondary | ICD-10-CM | POA: Diagnosis not present

## 2020-05-27 DIAGNOSIS — F32A Depression, unspecified: Secondary | ICD-10-CM | POA: Diagnosis not present

## 2020-05-31 DIAGNOSIS — I129 Hypertensive chronic kidney disease with stage 1 through stage 4 chronic kidney disease, or unspecified chronic kidney disease: Secondary | ICD-10-CM | POA: Diagnosis not present

## 2020-05-31 DIAGNOSIS — M1990 Unspecified osteoarthritis, unspecified site: Secondary | ICD-10-CM | POA: Diagnosis not present

## 2020-05-31 DIAGNOSIS — N401 Enlarged prostate with lower urinary tract symptoms: Secondary | ICD-10-CM | POA: Diagnosis not present

## 2020-05-31 DIAGNOSIS — I272 Pulmonary hypertension, unspecified: Secondary | ICD-10-CM | POA: Diagnosis not present

## 2020-05-31 DIAGNOSIS — G894 Chronic pain syndrome: Secondary | ICD-10-CM | POA: Diagnosis not present

## 2020-05-31 DIAGNOSIS — E1122 Type 2 diabetes mellitus with diabetic chronic kidney disease: Secondary | ICD-10-CM | POA: Diagnosis not present

## 2020-05-31 DIAGNOSIS — I480 Paroxysmal atrial fibrillation: Secondary | ICD-10-CM | POA: Diagnosis not present

## 2020-05-31 DIAGNOSIS — N184 Chronic kidney disease, stage 4 (severe): Secondary | ICD-10-CM | POA: Diagnosis not present

## 2020-05-31 DIAGNOSIS — E538 Deficiency of other specified B group vitamins: Secondary | ICD-10-CM | POA: Diagnosis not present

## 2020-06-13 ENCOUNTER — Other Ambulatory Visit: Payer: Self-pay

## 2020-06-13 ENCOUNTER — Ambulatory Visit (HOSPITAL_COMMUNITY): Payer: Medicare PPO | Attending: Cardiovascular Disease

## 2020-06-13 DIAGNOSIS — I340 Nonrheumatic mitral (valve) insufficiency: Secondary | ICD-10-CM | POA: Diagnosis not present

## 2020-06-13 DIAGNOSIS — R0602 Shortness of breath: Secondary | ICD-10-CM

## 2020-06-13 DIAGNOSIS — I77810 Thoracic aortic ectasia: Secondary | ICD-10-CM

## 2020-06-13 LAB — ECHOCARDIOGRAM COMPLETE
AR max vel: 2.09 cm2
AV Area VTI: 2.28 cm2
AV Area mean vel: 2.4 cm2
AV Mean grad: 9.7 mmHg
AV Peak grad: 18.7 mmHg
Ao pk vel: 2.16 m/s
Area-P 1/2: 2.39 cm2
MV VTI: 1.85 cm2
S' Lateral: 3.8 cm

## 2020-06-15 ENCOUNTER — Encounter: Payer: Medicare PPO | Admitting: Dermatology

## 2020-06-15 DIAGNOSIS — I129 Hypertensive chronic kidney disease with stage 1 through stage 4 chronic kidney disease, or unspecified chronic kidney disease: Secondary | ICD-10-CM | POA: Diagnosis not present

## 2020-06-15 DIAGNOSIS — M1990 Unspecified osteoarthritis, unspecified site: Secondary | ICD-10-CM | POA: Diagnosis not present

## 2020-06-15 DIAGNOSIS — E538 Deficiency of other specified B group vitamins: Secondary | ICD-10-CM | POA: Diagnosis not present

## 2020-06-15 DIAGNOSIS — G894 Chronic pain syndrome: Secondary | ICD-10-CM | POA: Diagnosis not present

## 2020-06-15 DIAGNOSIS — I480 Paroxysmal atrial fibrillation: Secondary | ICD-10-CM | POA: Diagnosis not present

## 2020-06-15 DIAGNOSIS — N184 Chronic kidney disease, stage 4 (severe): Secondary | ICD-10-CM | POA: Diagnosis not present

## 2020-06-15 DIAGNOSIS — I272 Pulmonary hypertension, unspecified: Secondary | ICD-10-CM | POA: Diagnosis not present

## 2020-06-15 DIAGNOSIS — N401 Enlarged prostate with lower urinary tract symptoms: Secondary | ICD-10-CM | POA: Diagnosis not present

## 2020-06-15 DIAGNOSIS — E1122 Type 2 diabetes mellitus with diabetic chronic kidney disease: Secondary | ICD-10-CM | POA: Diagnosis not present

## 2020-06-19 DIAGNOSIS — M25561 Pain in right knee: Secondary | ICD-10-CM | POA: Diagnosis not present

## 2020-06-22 DIAGNOSIS — I480 Paroxysmal atrial fibrillation: Secondary | ICD-10-CM | POA: Diagnosis not present

## 2020-06-22 DIAGNOSIS — I2510 Atherosclerotic heart disease of native coronary artery without angina pectoris: Secondary | ICD-10-CM | POA: Diagnosis not present

## 2020-06-22 DIAGNOSIS — I272 Pulmonary hypertension, unspecified: Secondary | ICD-10-CM | POA: Diagnosis not present

## 2020-06-22 DIAGNOSIS — Z9181 History of falling: Secondary | ICD-10-CM | POA: Diagnosis not present

## 2020-06-22 DIAGNOSIS — N184 Chronic kidney disease, stage 4 (severe): Secondary | ICD-10-CM | POA: Diagnosis not present

## 2020-06-22 DIAGNOSIS — I129 Hypertensive chronic kidney disease with stage 1 through stage 4 chronic kidney disease, or unspecified chronic kidney disease: Secondary | ICD-10-CM | POA: Diagnosis not present

## 2020-06-22 DIAGNOSIS — Z950 Presence of cardiac pacemaker: Secondary | ICD-10-CM | POA: Diagnosis not present

## 2020-06-22 DIAGNOSIS — E1122 Type 2 diabetes mellitus with diabetic chronic kidney disease: Secondary | ICD-10-CM | POA: Diagnosis not present

## 2020-06-30 ENCOUNTER — Ambulatory Visit (INDEPENDENT_AMBULATORY_CARE_PROVIDER_SITE_OTHER): Payer: Medicare PPO

## 2020-06-30 DIAGNOSIS — I442 Atrioventricular block, complete: Secondary | ICD-10-CM

## 2020-07-03 LAB — CUP PACEART REMOTE DEVICE CHECK
Battery Remaining Longevity: 123 mo
Battery Voltage: 3.01 V
Brady Statistic AP VP Percent: 35.57 %
Brady Statistic AP VS Percent: 0.01 %
Brady Statistic AS VP Percent: 64.28 %
Brady Statistic AS VS Percent: 0.15 %
Brady Statistic RA Percent Paced: 35.31 %
Brady Statistic RV Percent Paced: 99.85 %
Date Time Interrogation Session: 20220520043817
Implantable Lead Implant Date: 20200306
Implantable Lead Implant Date: 20200306
Implantable Lead Location: 753859
Implantable Lead Location: 753860
Implantable Lead Model: 5076
Implantable Lead Model: 5076
Implantable Pulse Generator Implant Date: 20200306
Lead Channel Impedance Value: 323 Ohm
Lead Channel Impedance Value: 361 Ohm
Lead Channel Impedance Value: 380 Ohm
Lead Channel Impedance Value: 456 Ohm
Lead Channel Pacing Threshold Amplitude: 0.375 V
Lead Channel Pacing Threshold Amplitude: 0.5 V
Lead Channel Pacing Threshold Pulse Width: 0.4 ms
Lead Channel Pacing Threshold Pulse Width: 0.4 ms
Lead Channel Sensing Intrinsic Amplitude: 0.875 mV
Lead Channel Sensing Intrinsic Amplitude: 0.875 mV
Lead Channel Sensing Intrinsic Amplitude: 9.875 mV
Lead Channel Sensing Intrinsic Amplitude: 9.875 mV
Lead Channel Setting Pacing Amplitude: 1.5 V
Lead Channel Setting Pacing Amplitude: 2.5 V
Lead Channel Setting Pacing Pulse Width: 0.4 ms
Lead Channel Setting Sensing Sensitivity: 0.6 mV

## 2020-07-17 NOTE — Progress Notes (Signed)
Remote pacemaker transmission.   

## 2020-07-23 DIAGNOSIS — Z9181 History of falling: Secondary | ICD-10-CM | POA: Diagnosis not present

## 2020-07-23 DIAGNOSIS — Z950 Presence of cardiac pacemaker: Secondary | ICD-10-CM | POA: Diagnosis not present

## 2020-07-23 DIAGNOSIS — I2510 Atherosclerotic heart disease of native coronary artery without angina pectoris: Secondary | ICD-10-CM | POA: Diagnosis not present

## 2020-07-23 DIAGNOSIS — I480 Paroxysmal atrial fibrillation: Secondary | ICD-10-CM | POA: Diagnosis not present

## 2020-07-23 DIAGNOSIS — N184 Chronic kidney disease, stage 4 (severe): Secondary | ICD-10-CM | POA: Diagnosis not present

## 2020-07-23 DIAGNOSIS — I129 Hypertensive chronic kidney disease with stage 1 through stage 4 chronic kidney disease, or unspecified chronic kidney disease: Secondary | ICD-10-CM | POA: Diagnosis not present

## 2020-07-23 DIAGNOSIS — E1122 Type 2 diabetes mellitus with diabetic chronic kidney disease: Secondary | ICD-10-CM | POA: Diagnosis not present

## 2020-07-23 DIAGNOSIS — I272 Pulmonary hypertension, unspecified: Secondary | ICD-10-CM | POA: Diagnosis not present

## 2020-07-24 DIAGNOSIS — Z961 Presence of intraocular lens: Secondary | ICD-10-CM | POA: Diagnosis not present

## 2020-07-24 DIAGNOSIS — Z9889 Other specified postprocedural states: Secondary | ICD-10-CM | POA: Diagnosis not present

## 2020-07-24 DIAGNOSIS — H5010 Unspecified exotropia: Secondary | ICD-10-CM | POA: Diagnosis not present

## 2020-07-24 DIAGNOSIS — H2512 Age-related nuclear cataract, left eye: Secondary | ICD-10-CM | POA: Diagnosis not present

## 2020-07-24 DIAGNOSIS — H53031 Strabismic amblyopia, right eye: Secondary | ICD-10-CM | POA: Diagnosis not present

## 2020-07-24 DIAGNOSIS — E119 Type 2 diabetes mellitus without complications: Secondary | ICD-10-CM | POA: Diagnosis not present

## 2020-07-24 DIAGNOSIS — H40033 Anatomical narrow angle, bilateral: Secondary | ICD-10-CM | POA: Diagnosis not present

## 2020-07-28 DIAGNOSIS — M6281 Muscle weakness (generalized): Secondary | ICD-10-CM | POA: Diagnosis not present

## 2020-07-28 DIAGNOSIS — Z7902 Long term (current) use of antithrombotics/antiplatelets: Secondary | ICD-10-CM | POA: Diagnosis not present

## 2020-07-28 DIAGNOSIS — I10 Essential (primary) hypertension: Secondary | ICD-10-CM | POA: Diagnosis not present

## 2020-07-28 DIAGNOSIS — G894 Chronic pain syndrome: Secondary | ICD-10-CM | POA: Diagnosis not present

## 2020-07-31 ENCOUNTER — Telehealth: Payer: Self-pay | Admitting: Dermatology

## 2020-07-31 MED ORDER — MUPIROCIN 2 % EX OINT: 1.0000 "application " | TOPICAL_OINTMENT | Freq: Two times a day (BID) | CUTANEOUS | 2 refills | Status: DC

## 2020-07-31 NOTE — Telephone Encounter (Signed)
Patient would like refill for mupirocin ointment 2% sent to [facility] on [address].

## 2020-08-07 ENCOUNTER — Ambulatory Visit: Payer: Medicare PPO | Admitting: Dermatology

## 2020-08-10 ENCOUNTER — Telehealth: Payer: Self-pay

## 2020-08-10 ENCOUNTER — Encounter: Payer: Self-pay | Admitting: Nurse Practitioner

## 2020-08-10 NOTE — Telephone Encounter (Signed)
Spoke with wife. Informed her that we are unable to see him at this time. Advised her to get current doctor to send him for services that he needs.

## 2020-08-15 ENCOUNTER — Other Ambulatory Visit: Payer: Self-pay

## 2020-08-15 ENCOUNTER — Emergency Department (HOSPITAL_COMMUNITY): Payer: Medicare PPO

## 2020-08-15 ENCOUNTER — Emergency Department (HOSPITAL_COMMUNITY)
Admission: EM | Admit: 2020-08-15 | Discharge: 2020-08-15 | Disposition: A | Discharge status: Home / Self Care | Payer: Medicare PPO | Attending: Emergency Medicine | Admitting: Emergency Medicine

## 2020-08-15 DIAGNOSIS — R1013 Epigastric pain: Secondary | ICD-10-CM | POA: Insufficient documentation

## 2020-08-15 DIAGNOSIS — R0789 Other chest pain: Secondary | ICD-10-CM | POA: Diagnosis not present

## 2020-08-15 DIAGNOSIS — I517 Cardiomegaly: Secondary | ICD-10-CM | POA: Diagnosis not present

## 2020-08-15 DIAGNOSIS — R072 Precordial pain: Secondary | ICD-10-CM | POA: Insufficient documentation

## 2020-08-15 DIAGNOSIS — N184 Chronic kidney disease, stage 4 (severe): Secondary | ICD-10-CM | POA: Diagnosis not present

## 2020-08-15 DIAGNOSIS — I959 Hypotension, unspecified: Secondary | ICD-10-CM | POA: Diagnosis not present

## 2020-08-15 DIAGNOSIS — R079 Chest pain, unspecified: Secondary | ICD-10-CM | POA: Diagnosis not present

## 2020-08-15 DIAGNOSIS — Z91040 Latex allergy status: Secondary | ICD-10-CM | POA: Diagnosis not present

## 2020-08-15 DIAGNOSIS — Z85828 Personal history of other malignant neoplasm of skin: Secondary | ICD-10-CM | POA: Diagnosis not present

## 2020-08-15 DIAGNOSIS — I2510 Atherosclerotic heart disease of native coronary artery without angina pectoris: Secondary | ICD-10-CM | POA: Diagnosis not present

## 2020-08-15 DIAGNOSIS — Z950 Presence of cardiac pacemaker: Secondary | ICD-10-CM | POA: Insufficient documentation

## 2020-08-15 DIAGNOSIS — I10 Essential (primary) hypertension: Secondary | ICD-10-CM | POA: Diagnosis not present

## 2020-08-15 DIAGNOSIS — J9811 Atelectasis: Secondary | ICD-10-CM | POA: Diagnosis not present

## 2020-08-15 DIAGNOSIS — I129 Hypertensive chronic kidney disease with stage 1 through stage 4 chronic kidney disease, or unspecified chronic kidney disease: Secondary | ICD-10-CM | POA: Insufficient documentation

## 2020-08-15 DIAGNOSIS — Z96641 Presence of right artificial hip joint: Secondary | ICD-10-CM | POA: Insufficient documentation

## 2020-08-15 DIAGNOSIS — R0902 Hypoxemia: Secondary | ICD-10-CM | POA: Diagnosis not present

## 2020-08-15 LAB — BASIC METABOLIC PANEL
Anion gap: 7 (ref 5–15)
BUN: 19 mg/dL (ref 8–23)
CO2: 29 mmol/L (ref 22–32)
Calcium: 9.4 mg/dL (ref 8.9–10.3)
Chloride: 100 mmol/L (ref 98–111)
Creatinine, Ser: 1.58 mg/dL — ABNORMAL HIGH (ref 0.61–1.24)
GFR, Estimated: 42 mL/min — ABNORMAL LOW (ref >60)
Glucose, Bld: 169 mg/dL — ABNORMAL HIGH (ref 70–99)
Potassium: 3.8 mmol/L (ref 3.5–5.1)
Sodium: 136 mmol/L (ref 135–145)

## 2020-08-15 LAB — CBC
HCT: 40.5 % (ref 39.0–52.0)
Hemoglobin: 13.1 g/dL (ref 13.0–17.0)
MCH: 29.7 pg (ref 26.0–34.0)
MCHC: 32.3 g/dL (ref 30.0–36.0)
MCV: 91.8 fL (ref 80.0–100.0)
Platelets: 157 10*3/uL (ref 150–400)
RBC: 4.41 MIL/uL (ref 4.22–5.81)
RDW: 12.9 % (ref 11.5–15.5)
WBC: 6.8 10*3/uL (ref 4.0–10.5)
nRBC: 0 % (ref 0.0–0.2)

## 2020-08-15 LAB — HEPATIC FUNCTION PANEL
ALT: 12 U/L (ref 0–44)
AST: 22 U/L (ref 15–41)
Albumin: 4 g/dL (ref 3.5–5.0)
Alkaline Phosphatase: 30 U/L — ABNORMAL LOW (ref 38–126)
Bilirubin, Direct: 0.2 mg/dL (ref 0.0–0.2)
Indirect Bilirubin: 0.8 mg/dL (ref 0.3–0.9)
Total Bilirubin: 1 mg/dL (ref 0.3–1.2)
Total Protein: 6.6 g/dL (ref 6.5–8.1)

## 2020-08-15 LAB — LIPASE, BLOOD: Lipase: 34 U/L (ref 11–51)

## 2020-08-15 LAB — TROPONIN I (HIGH SENSITIVITY)
Troponin I (High Sensitivity): 22 ng/L — ABNORMAL HIGH (ref <18)
Troponin I (High Sensitivity): 20 ng/L — ABNORMAL HIGH (ref <18)

## 2020-08-15 NOTE — ED Provider Notes (Signed)
Emergency Department Provider Note   I have reviewed the triage vital signs and the nursing notes.   HISTORY  Chief Complaint Chest Pain   HPI Gary [name] is a 87 y.o. male with past medical history reviewed below presents to the emergency department with discomfort this morning.  Patient states he typically has the same pain into his upper abdomen but today felt it more in his chest.  He decided to come in for evaluation.  He denies shortness of breath, fever, cough.  No radiation of symptoms or other modifying factors.  No diaphoresis, vomiting, near syncope.  He did complain of some nausea per EMS and was given Zofran.  Symptoms have resolved.  The patient's pain was dull and left-sided with no radiation.  He was given 324 mg of aspirin and 1 nitroglycerin with EMS but had no change in his pain symptoms.   Past Medical History:  Diagnosis Date   Adenomatous colon polyp    2010    Anemia    Anxiety    Arthritis    "hips and lower back"   Arthritis pain    Atypical mole 06/21/2003   left neck, inferior - slight to moderate atypia   Basal cell carcinoma 10/04/2014   right shin bcc (tx cx3, 56fu )   Blood transfusion    Chest pain 03/29/11   2D Echo without contrast on 03/29/11 - EF= 55-60%.   Cholecystitis    Chronic back pain greater than 3 months duration    Chronic kidney disease    Colon polyps    Complication of anesthesia    once slow to wake up   Coronary angioplasty status 2011   Coronary artery disease    Diarrhea    Diverticulosis    Dyslipidemia    GERD (gastroesophageal reflux disease)    GI bleeding after 07/2009   "triggered by Plavix & ASA; from my diverticulosis"   H/O Clostridium difficile infection    H/O myocardial perfusion scan 04/04/11   For Pre-noncardiac surgery - EF = 67%, no ischemia, and is considered low risk.   Heart attack ([place]) 07/2009   nonSTEMI with an occluded circumflex artery and collaterals.   Heart murmur    Hemorrhoids     History of shingles    Hyperlipidemia    Hypertension    Kidney stones    Lower extremity edema    Taking furosemide and it seems to be helping.   Neuromuscular disorder ([place])    Nonspecific chest pain 08/26/2012   Went to ED 08/26/12   Squamous cell carcinoma of skin 05/13/2000   Right outer forehead   Squamous cell carcinoma of skin 03/02/2001   Post crown - tx 03/19/2001   Squamous cell carcinoma of skin 09/01/2001   Left post auricular - tx 10/09/2001   Squamous cell carcinoma of skin 06/21/2003   Top right ear - tx 07/05/2003   Squamous cell carcinoma of skin 06/21/2003   Right upper arm - tx 07/05/2003   Squamous cell carcinoma of skin 10/12/2003   Left superior ear lobe - MOHs   Squamous cell carcinoma of skin 12/15/2006   Right ear, superior - tx 01/26/2007   Squamous cell carcinoma of skin 03/13/2009   Right temple - tx 05/08/2009   Squamous cell carcinoma of skin 03/24/2013   Upper left forearm - tx p bx   Squamous cell carcinoma of skin 12/06/2013   Right forearm - CX3 + 5FU   Squamous cell  carcinoma of skin 02/10/2014   Right hand - tx p bx   Squamous cell carcinoma of skin 10/04/2014   Right shin - tx p bx   Squamous cell carcinoma of skin 08/21/2016   Left hand - tx p bx   Squamous cell carcinoma of skin 01/01/2017   Right hand - tx 02/06/2017   Squamous cell carcinoma of skin 10/21/2017   Left side of scalp - tx 03/12/2018   Squamous cell carcinoma of skin 10/21/2017   Left post scalp, inf - tx 08/13/2018   Squamous cell carcinoma of skin 10/21/2017   Right scalp - tx p bx   Squamous cell carcinoma of skin 10/21/2017   Left cheek - tx 03/12/2018   Squamous cell carcinoma of skin 08/13/2018   Left top hand, proximal pinky - tx p bx   Squamous cell carcinoma of skin 12/23/2018   Right bicep lower - tx p bx   Squamous cell carcinoma of skin 05/18/2019   Right 4th finger metacarpophalangeal joint   Squamous cell carcinoma of skin 05/18/2019   Left 5th  finger metacarpophalangeal joint   Squamous cell carcinoma of skin 05/18/2019   Left 2nd finger metacarpophalangeal joint   Stomach problems    Syncope and collapse 04/16/2018    Patient Active Problem List   Diagnosis Date Noted   Vitamin B12 deficiency 04/15/2019   Abnormal glucose 04/15/2019   Stage 4 chronic kidney disease ([place]) 04/15/2019   Dyspnea on exertion 04/15/2019   Pulmonary hypertension ([place]) 04/08/2019   Chest pain 03/27/2019   Complete heart block (HCC) 04/17/2018   Syncope and collapse 04/16/2018   Sinus pause 04/16/2018   Snoring 10/03/2017   Other fatigue 10/03/2017   Pre-syncope 05/14/2016   Murmur 05/14/2016   CAD in native artery 10/31/2015   History of MI (myocardial infarction) 10/31/2015   Preoperative cardiovascular examination 12/26/2014   History of colonic polyps 01/17/2014   PAF (paroxysmal atrial fibrillation) (HCC) 19/62/2297   Metabolic acidosis 98/92/1194   Acute renal failure ([place]) 08/13/2013   Stage 3b chronic kidney disease ([place]) 08/13/2013   Anemia in chronic kidney disease 08/13/2013   Hypokalemia 08/12/2013   Acute blood loss anemia 08/09/2013   Encephalopathy 08/09/2013   Severe sepsis (HCC) 08/08/2013   Hypotension, unspecified 08/07/2013   Complex renal cyst 08/07/2013   Renal mass, left 08/06/2013   Upper GI bleeding 08/05/2013   Acute esophagitis 08/05/2013   Urinary retention 08/04/2013   S/P total hip arthroplasty 08/03/2013   Degenerative joint disease (DJD) of hip 08/03/2013    Class: Chronic   Internal hemorrhoids with other complication 17/40/8144   Tinnitus 10/22/2012   C. difficile colitis 07/26/2012   Ileus, postoperative ([place]) 07/24/2012   Somnolence 07/24/2012   Anxiety state 07/24/2012   Chest pain- ER 08/26/12 03/30/2011   Fever 03/28/2011   Leucocytosis 03/28/2011   CKD (chronic kidney disease), stage III ([place]) 03/28/2011   Hyponatremia 02/02/2011   BPH (benign prostatic hyperplasia) 02/01/2011    Cholecystitis chronic, acute 01/26/2011   Dyslipidemia 01/22/2011   Abdominal bloating 08/07/2010   Loose stools 08/07/2010   Coronary atherosclerosis 10/26/2009   Essential hypertension 06/07/2009   DIVERTICULOSIS OF COLON 10/31/2008   DIVERTICULOSIS, COLON, WITH HEMORRHAGE, surg 12/11  10/31/2008   Personal history of colonic polyps 10/31/2008    Past Surgical History:  Procedure Laterality Date   [place]  08/09/2009   Circumflex 100% occluded with right-to-left collaterals, mild irregularities in the proximal  and distal LAD and diagonal system, normal LV systolic function, and minor infrarenal irregularities, but no abdominal aortic aneurysm.   CHOLECYSTECTOMY  04/12/2011   Procedure: CHOLECYSTECTOMY;  Surgeon: Adin Hector, MD;  Location: [place];  Service: General;  Laterality: N/A;   CHOLECYSTECTOMY  04/12/2011   Procedure: LAPAROSCOPIC CHOLECYSTECTOMY;  Surgeon: Adin Hector, MD;  Location: [place];  Service: General;  Laterality: N/A;  converted to open   COLON SURGERY     COLONOSCOPY   ESOPHAGOGASTRODUODENOSCOPY N/A 08/05/2013   Procedure: ESOPHAGOGASTRODUODENOSCOPY (EGD);  Surgeon: Jerene Bears, MD;  Location: [place];  Service: Gastroenterology;  Laterality: N/A;   EYE SURGERY  1942   "right eye was crossed; they  corrected it"   LUMBAR FUSION  07/22/2012   PACEMAKER IMPLANT N/A 04/17/2018   Procedure: PACEMAKER IMPLANT;  Surgeon: Deboraha Sprang, MD;  Location: [place];  Service: Cardiovascular;  Laterality: N/A;   POSTERIOR FUSION LUMBAR SPINE  08/1995   "bottom 4 vertebra"   SUBTOTAL COLECTOMY  01/24/2010   TOTAL HIP ARTHROPLASTY Right 08/03/2013   Procedure: TOTAL HIP ARTHROPLASTY ANTERIOR APPROACH;  Surgeon: Hessie Dibble, MD;  Location: [place];  Service: Orthopedics;  Laterality: Right;    Allergies Naproxen, Clopidogrel bisulfate, Sulfonamide derivatives, Other, Latex, and Tape  Family History  Problem Relation  Age of Onset   Heart disease Father    Stroke Father    Pancreatic cancer Mother    Diverticulosis Sister        x4   Colon cancer Neg Hx     Social History Social History   Tobacco Use   Smoking status: Never   Smokeless tobacco: Never  Vaping Use   Vaping Use: Never used  Substance Use Topics   Alcohol use: No   Drug use: No    Review of Systems  Constitutional: No fever/chills Eyes: No visual changes. ENT: No sore throat. Cardiovascular: Positive chest pain. Respiratory: Denies shortness of breath. Gastrointestinal: No abdominal pain. Positive nausea, no vomiting.  No diarrhea.  No constipation. Genitourinary: Negative for dysuria. Musculoskeletal: Negative for back pain. Skin: Negative for rash. Neurological: Negative for headaches, focal weakness or numbness.  10-point ROS otherwise negative.  ____________________________________________   PHYSICAL EXAM:  VITAL SIGNS: ED Triage Vitals  Enc Vitals Group     BP 08/15/20 0738 (!) 115/58     Pulse Rate 08/15/20 0738 64     Resp 08/15/20 0738 16     Temp 08/15/20 0738 98.6 F (37 C)     Temp Source 08/15/20 0738 Oral     SpO2 08/15/20 0738 92 %     Weight 08/15/20 1114 206 lb (93.4 kg)     Height 08/15/20 1114 5\' 4"  (1.626 m)    Constitutional: Alert and oriented. Well appearing and in no acute distress. Eyes: Conjunctivae are normal.  Head: Atraumatic. Nose: No congestion/rhinnorhea. Mouth/Throat: Mucous membranes are moist.  Neck: No stridor.  Cardiovascular: Normal rate, regular rhythm. Good peripheral circulation. Grossly normal heart sounds.   Respiratory: Normal respiratory effort.  No retractions. Lungs CTAB. Gastrointestinal: Soft with mild epigastric discomfort.  Negative Murphy sign.  Palpation does reproduce the patient's pain. No distention.  Musculoskeletal: No lower extremity tenderness nor edema. No gross deformities of extremities. Neurologic:  Normal speech and language. No gross  focal neurologic deficits are appreciated.  Skin:  Skin is warm, dry and intact. No rash noted.   ____________________________________________   LABS (all labs ordered are listed, but  only abnormal results are displayed)  Labs Reviewed  BASIC METABOLIC PANEL - Abnormal; Notable for the following components:      Result Value   Glucose, Bld 169 (*)    Creatinine, Ser 1.58 (*)    GFR, Estimated 42 (*)    All other components within normal limits  HEPATIC FUNCTION PANEL - Abnormal; Notable for the following components:   Alkaline Phosphatase 30 (*)    All other components within normal limits  TROPONIN I (HIGH SENSITIVITY) - Abnormal; Notable for the following components:   Troponin I (High Sensitivity) 22 (*)    All other components within normal limits  TROPONIN I (HIGH SENSITIVITY) - Abnormal; Notable for the following components:   Troponin I (High Sensitivity) 20 (*)    All other components within normal limits  CBC  LIPASE, BLOOD   ____________________________________________  EKG   EKG Interpretation  Date/Time:  Tuesday August 15 2020 07:40:02 EDT Ventricular Rate:  72 PR Interval:    QRS Duration: 176 QT Interval:  462 QTC Calculation: 505 R Axis:   -68 Text Interpretation: Ventricular-paced rhythm Abnormal ECG Confirmed by Nanda Quinton [id number]) on 08/15/2020 11:11:13 AM         ____________________________________________  RADIOLOGY  DG Chest 2 View  Result Date: 08/15/2020 CLINICAL DATA:  Chest pain. EXAM: CHEST - 2 VIEW COMPARISON:  03/27/2019. FINDINGS: Cardiac pacer stable position. Stable cardiomegaly. Low lung volumes with bibasilar atelectasis again noted. Mild bibasilar infiltrates cannot be excluded. Tiny right pleural effusion cannot be excluded. No pneumothorax. IMPRESSION: 1.  Cardiac pacer stable position.  Stable cardiomegaly. 2. Low lung volumes with bibasilar atelectasis again noted. Mild bibasilar infiltrates cannot be excluded. Tiny right  pleural effusion cannot be excluded. Electronically Signed   By: Marcello Moores  Register   On: 08/15/2020 08:23    ____________________________________________   PROCEDURES  Procedure(s) performed:   Procedures  None  ____________________________________________   INITIAL IMPRESSION / ASSESSMENT AND PLAN / ED COURSE  Pertinent labs & imaging results that were available during my care of the patient were reviewed by me and considered in my medical decision making (see chart for details).   Patient presents to the emergency department with chest discomfort which is since resolved.  Some mild nausea.  Patient typically has the same pain in his abdomen but was feeling it more in the chest this morning which concerned him.  His EKG appears similar to prior with pacing from heart block in 2020.  His chest x-ray and initial blood work was drawn during the MSE process in triage.  There is a normal troponin value and chest x-ray with low lung volumes.  Patient is not having any pneumonia symptoms to suspect an occult pneumonia here.   01:19 PM  Second troponin similar to the first.  LFTs and bilirubin are normal.  Lipase normal.  Patient continues to feel well.  Most of his pain seems epigastric.  Feel that the patient is stable for discharge.  His sister is here at bedside and will transport him home. Discussed ED return precautions.  ____________________________________________  FINAL CLINICAL IMPRESSION(S) / ED DIAGNOSES  Final diagnoses:  Precordial chest pain    Note:  This document was prepared using Dragon voice recognition software and may include unintentional dictation errors.  Nanda Quinton, MD, [facility] Emergency Medicine    Voncille Simm, [facility], MD 08/15/20 1321

## 2020-08-15 NOTE — ED Notes (Signed)
Pt hard of hearing.

## 2020-08-15 NOTE — ED Notes (Signed)
Pt verbalized understanding of discharge paperwork and follow-up care.  °

## 2020-08-15 NOTE — Discharge Instructions (Addendum)
You were seen in the emergency department today with chest discomfort.  Your lab work and x-rays appear normal.  I am not showing signs of a heart attack at this time but if your pain returns or worsens you should return to the emergency department.  Please call your primary doctor and your cardiology team to make them aware of your ED visit.  Return with any new or suddenly worsening symptoms.

## 2020-08-15 NOTE — ED Triage Notes (Signed)
Pt via EMS for eval of dull, left sided, non radiating chest pain that started this morning at 0600. 324 mg of asa and one nitro given by EMS without relief. Also endorses nausea, 4 mg zofran given.

## 2020-08-19 DIAGNOSIS — I10 Essential (primary) hypertension: Secondary | ICD-10-CM | POA: Diagnosis not present

## 2020-08-19 DIAGNOSIS — W19XXXA Unspecified fall, initial encounter: Secondary | ICD-10-CM | POA: Diagnosis not present

## 2020-08-19 DIAGNOSIS — G894 Chronic pain syndrome: Secondary | ICD-10-CM | POA: Diagnosis not present

## 2020-08-19 DIAGNOSIS — Z7902 Long term (current) use of antithrombotics/antiplatelets: Secondary | ICD-10-CM | POA: Diagnosis not present

## 2020-08-22 DIAGNOSIS — I480 Paroxysmal atrial fibrillation: Secondary | ICD-10-CM | POA: Diagnosis not present

## 2020-08-22 DIAGNOSIS — N184 Chronic kidney disease, stage 4 (severe): Secondary | ICD-10-CM | POA: Diagnosis not present

## 2020-08-22 DIAGNOSIS — I129 Hypertensive chronic kidney disease with stage 1 through stage 4 chronic kidney disease, or unspecified chronic kidney disease: Secondary | ICD-10-CM | POA: Diagnosis not present

## 2020-08-22 DIAGNOSIS — E1122 Type 2 diabetes mellitus with diabetic chronic kidney disease: Secondary | ICD-10-CM | POA: Diagnosis not present

## 2020-08-22 DIAGNOSIS — Z950 Presence of cardiac pacemaker: Secondary | ICD-10-CM | POA: Diagnosis not present

## 2020-08-22 DIAGNOSIS — I2510 Atherosclerotic heart disease of native coronary artery without angina pectoris: Secondary | ICD-10-CM | POA: Diagnosis not present

## 2020-08-22 DIAGNOSIS — Z9181 History of falling: Secondary | ICD-10-CM | POA: Diagnosis not present

## 2020-08-22 DIAGNOSIS — I272 Pulmonary hypertension, unspecified: Secondary | ICD-10-CM | POA: Diagnosis not present

## 2020-08-24 DIAGNOSIS — C44229 Squamous cell carcinoma of skin of left ear and external auricular canal: Secondary | ICD-10-CM | POA: Diagnosis not present

## 2020-09-13 DIAGNOSIS — I480 Paroxysmal atrial fibrillation: Secondary | ICD-10-CM | POA: Diagnosis not present

## 2020-09-13 DIAGNOSIS — M79644 Pain in right finger(s): Secondary | ICD-10-CM | POA: Diagnosis not present

## 2020-09-13 DIAGNOSIS — G894 Chronic pain syndrome: Secondary | ICD-10-CM | POA: Diagnosis not present

## 2020-09-13 DIAGNOSIS — M6281 Muscle weakness (generalized): Secondary | ICD-10-CM | POA: Diagnosis not present

## 2020-09-13 DIAGNOSIS — I10 Essential (primary) hypertension: Secondary | ICD-10-CM | POA: Diagnosis not present

## 2020-09-22 DIAGNOSIS — Z85828 Personal history of other malignant neoplasm of skin: Secondary | ICD-10-CM | POA: Diagnosis not present

## 2020-09-22 DIAGNOSIS — I25119 Atherosclerotic heart disease of native coronary artery with unspecified angina pectoris: Secondary | ICD-10-CM | POA: Diagnosis not present

## 2020-09-22 DIAGNOSIS — I10 Essential (primary) hypertension: Secondary | ICD-10-CM | POA: Diagnosis not present

## 2020-09-22 DIAGNOSIS — N184 Chronic kidney disease, stage 4 (severe): Secondary | ICD-10-CM | POA: Diagnosis not present

## 2020-09-22 DIAGNOSIS — Z9181 History of falling: Secondary | ICD-10-CM | POA: Diagnosis not present

## 2020-09-22 DIAGNOSIS — E785 Hyperlipidemia, unspecified: Secondary | ICD-10-CM | POA: Diagnosis not present

## 2020-09-22 DIAGNOSIS — E1142 Type 2 diabetes mellitus with diabetic polyneuropathy: Secondary | ICD-10-CM | POA: Diagnosis not present

## 2020-09-22 DIAGNOSIS — I272 Pulmonary hypertension, unspecified: Secondary | ICD-10-CM | POA: Diagnosis not present

## 2020-09-22 DIAGNOSIS — F419 Anxiety disorder, unspecified: Secondary | ICD-10-CM | POA: Diagnosis not present

## 2020-09-22 DIAGNOSIS — E1122 Type 2 diabetes mellitus with diabetic chronic kidney disease: Secondary | ICD-10-CM | POA: Diagnosis not present

## 2020-09-22 DIAGNOSIS — B351 Tinea unguium: Secondary | ICD-10-CM | POA: Diagnosis not present

## 2020-09-22 DIAGNOSIS — I129 Hypertensive chronic kidney disease with stage 1 through stage 4 chronic kidney disease, or unspecified chronic kidney disease: Secondary | ICD-10-CM | POA: Diagnosis not present

## 2020-09-22 DIAGNOSIS — I480 Paroxysmal atrial fibrillation: Secondary | ICD-10-CM | POA: Diagnosis not present

## 2020-09-22 DIAGNOSIS — Z79899 Other long term (current) drug therapy: Secondary | ICD-10-CM | POA: Diagnosis not present

## 2020-09-22 DIAGNOSIS — I4891 Unspecified atrial fibrillation: Secondary | ICD-10-CM | POA: Diagnosis not present

## 2020-09-22 DIAGNOSIS — Z833 Family history of diabetes mellitus: Secondary | ICD-10-CM | POA: Diagnosis not present

## 2020-09-22 DIAGNOSIS — N400 Enlarged prostate without lower urinary tract symptoms: Secondary | ICD-10-CM | POA: Diagnosis not present

## 2020-09-22 DIAGNOSIS — Z96649 Presence of unspecified artificial hip joint: Secondary | ICD-10-CM | POA: Diagnosis not present

## 2020-09-22 DIAGNOSIS — I2510 Atherosclerotic heart disease of native coronary artery without angina pectoris: Secondary | ICD-10-CM | POA: Diagnosis not present

## 2020-09-22 DIAGNOSIS — I509 Heart failure, unspecified: Secondary | ICD-10-CM | POA: Diagnosis not present

## 2020-09-22 DIAGNOSIS — Z85820 Personal history of malignant melanoma of skin: Secondary | ICD-10-CM | POA: Diagnosis not present

## 2020-09-22 DIAGNOSIS — E1136 Type 2 diabetes mellitus with diabetic cataract: Secondary | ICD-10-CM | POA: Diagnosis not present

## 2020-09-22 DIAGNOSIS — Z950 Presence of cardiac pacemaker: Secondary | ICD-10-CM | POA: Diagnosis not present

## 2020-09-22 DIAGNOSIS — Z79891 Long term (current) use of opiate analgesic: Secondary | ICD-10-CM | POA: Diagnosis not present

## 2020-09-22 DIAGNOSIS — Z8249 Family history of ischemic heart disease and other diseases of the circulatory system: Secondary | ICD-10-CM | POA: Diagnosis not present

## 2020-09-22 DIAGNOSIS — I77819 Aortic ectasia, unspecified site: Secondary | ICD-10-CM | POA: Diagnosis not present

## 2020-09-22 DIAGNOSIS — I130 Hypertensive heart and chronic kidney disease with heart failure and stage 1 through stage 4 chronic kidney disease, or unspecified chronic kidney disease: Secondary | ICD-10-CM | POA: Diagnosis not present

## 2020-09-29 ENCOUNTER — Ambulatory Visit (INDEPENDENT_AMBULATORY_CARE_PROVIDER_SITE_OTHER): Payer: Medicare PPO

## 2020-09-29 DIAGNOSIS — I442 Atrioventricular block, complete: Secondary | ICD-10-CM | POA: Diagnosis not present

## 2020-10-01 LAB — CUP PACEART REMOTE DEVICE CHECK
Battery Remaining Longevity: 119 mo
Battery Voltage: 3.01 V
Brady Statistic AP VP Percent: 33.59 %
Brady Statistic AP VS Percent: 0 %
Brady Statistic AS VP Percent: 66.26 %
Brady Statistic AS VS Percent: 0.14 %
Brady Statistic RA Percent Paced: 33.34 %
Brady Statistic RV Percent Paced: 99.86 %
Date Time Interrogation Session: 20220818231434
Implantable Lead Implant Date: 20200306
Implantable Lead Implant Date: 20200306
Implantable Lead Location: 753859
Implantable Lead Location: 753860
Implantable Lead Model: 5076
Implantable Lead Model: 5076
Implantable Pulse Generator Implant Date: 20200306
Lead Channel Impedance Value: 323 Ohm
Lead Channel Impedance Value: 361 Ohm
Lead Channel Impedance Value: 380 Ohm
Lead Channel Impedance Value: 475 Ohm
Lead Channel Pacing Threshold Amplitude: 0.5 V
Lead Channel Pacing Threshold Amplitude: 0.5 V
Lead Channel Pacing Threshold Pulse Width: 0.4 ms
Lead Channel Pacing Threshold Pulse Width: 0.4 ms
Lead Channel Sensing Intrinsic Amplitude: 1.75 mV
Lead Channel Sensing Intrinsic Amplitude: 1.75 mV
Lead Channel Sensing Intrinsic Amplitude: 9.875 mV
Lead Channel Sensing Intrinsic Amplitude: 9.875 mV
Lead Channel Setting Pacing Amplitude: 1.5 V
Lead Channel Setting Pacing Amplitude: 2.5 V
Lead Channel Setting Pacing Pulse Width: 0.4 ms
Lead Channel Setting Sensing Sensitivity: 0.6 mV

## 2020-10-09 ENCOUNTER — Other Ambulatory Visit: Payer: Self-pay | Admitting: Internal Medicine

## 2020-10-09 DIAGNOSIS — I10 Essential (primary) hypertension: Secondary | ICD-10-CM

## 2020-10-09 DIAGNOSIS — I2510 Atherosclerotic heart disease of native coronary artery without angina pectoris: Secondary | ICD-10-CM

## 2020-10-09 DIAGNOSIS — E785 Hyperlipidemia, unspecified: Secondary | ICD-10-CM

## 2020-10-09 DIAGNOSIS — N1832 Chronic kidney disease, stage 3b: Secondary | ICD-10-CM

## 2020-10-09 DIAGNOSIS — I442 Atrioventricular block, complete: Secondary | ICD-10-CM

## 2020-10-12 DIAGNOSIS — L03011 Cellulitis of right finger: Secondary | ICD-10-CM | POA: Diagnosis not present

## 2020-10-16 DIAGNOSIS — G894 Chronic pain syndrome: Secondary | ICD-10-CM | POA: Diagnosis not present

## 2020-10-16 DIAGNOSIS — N184 Chronic kidney disease, stage 4 (severe): Secondary | ICD-10-CM | POA: Diagnosis not present

## 2020-10-16 DIAGNOSIS — E1122 Type 2 diabetes mellitus with diabetic chronic kidney disease: Secondary | ICD-10-CM | POA: Diagnosis not present

## 2020-10-16 DIAGNOSIS — I2510 Atherosclerotic heart disease of native coronary artery without angina pectoris: Secondary | ICD-10-CM | POA: Diagnosis not present

## 2020-10-16 DIAGNOSIS — I129 Hypertensive chronic kidney disease with stage 1 through stage 4 chronic kidney disease, or unspecified chronic kidney disease: Secondary | ICD-10-CM | POA: Diagnosis not present

## 2020-10-16 DIAGNOSIS — I480 Paroxysmal atrial fibrillation: Secondary | ICD-10-CM | POA: Diagnosis not present

## 2020-10-16 DIAGNOSIS — M1612 Unilateral primary osteoarthritis, left hip: Secondary | ICD-10-CM | POA: Diagnosis not present

## 2020-10-16 DIAGNOSIS — F419 Anxiety disorder, unspecified: Secondary | ICD-10-CM | POA: Diagnosis not present

## 2020-10-16 DIAGNOSIS — M479 Spondylosis, unspecified: Secondary | ICD-10-CM | POA: Diagnosis not present

## 2020-10-17 ENCOUNTER — Ambulatory Visit: Payer: Medicare PPO | Admitting: Dermatology

## 2020-10-17 NOTE — Progress Notes (Signed)
Remote pacemaker transmission.   

## 2020-10-23 DIAGNOSIS — I480 Paroxysmal atrial fibrillation: Secondary | ICD-10-CM | POA: Diagnosis not present

## 2020-10-23 DIAGNOSIS — I272 Pulmonary hypertension, unspecified: Secondary | ICD-10-CM | POA: Diagnosis not present

## 2020-10-23 DIAGNOSIS — N184 Chronic kidney disease, stage 4 (severe): Secondary | ICD-10-CM | POA: Diagnosis not present

## 2020-10-23 DIAGNOSIS — Z950 Presence of cardiac pacemaker: Secondary | ICD-10-CM | POA: Diagnosis not present

## 2020-10-23 DIAGNOSIS — Z9181 History of falling: Secondary | ICD-10-CM | POA: Diagnosis not present

## 2020-10-23 DIAGNOSIS — E1122 Type 2 diabetes mellitus with diabetic chronic kidney disease: Secondary | ICD-10-CM | POA: Diagnosis not present

## 2020-10-23 DIAGNOSIS — I129 Hypertensive chronic kidney disease with stage 1 through stage 4 chronic kidney disease, or unspecified chronic kidney disease: Secondary | ICD-10-CM | POA: Diagnosis not present

## 2020-10-23 DIAGNOSIS — I2510 Atherosclerotic heart disease of native coronary artery without angina pectoris: Secondary | ICD-10-CM | POA: Diagnosis not present

## 2020-10-24 DIAGNOSIS — I480 Paroxysmal atrial fibrillation: Secondary | ICD-10-CM | POA: Diagnosis not present

## 2020-10-24 DIAGNOSIS — F419 Anxiety disorder, unspecified: Secondary | ICD-10-CM | POA: Diagnosis not present

## 2020-10-24 DIAGNOSIS — M479 Spondylosis, unspecified: Secondary | ICD-10-CM | POA: Diagnosis not present

## 2020-10-24 DIAGNOSIS — G894 Chronic pain syndrome: Secondary | ICD-10-CM | POA: Diagnosis not present

## 2020-10-24 DIAGNOSIS — I129 Hypertensive chronic kidney disease with stage 1 through stage 4 chronic kidney disease, or unspecified chronic kidney disease: Secondary | ICD-10-CM | POA: Diagnosis not present

## 2020-10-24 DIAGNOSIS — M1612 Unilateral primary osteoarthritis, left hip: Secondary | ICD-10-CM | POA: Diagnosis not present

## 2020-10-24 DIAGNOSIS — N184 Chronic kidney disease, stage 4 (severe): Secondary | ICD-10-CM | POA: Diagnosis not present

## 2020-10-24 DIAGNOSIS — E1122 Type 2 diabetes mellitus with diabetic chronic kidney disease: Secondary | ICD-10-CM | POA: Diagnosis not present

## 2020-10-24 DIAGNOSIS — I2510 Atherosclerotic heart disease of native coronary artery without angina pectoris: Secondary | ICD-10-CM | POA: Diagnosis not present

## 2020-10-25 ENCOUNTER — Telehealth: Payer: Self-pay | Admitting: Internal Medicine

## 2020-10-25 NOTE — Telephone Encounter (Signed)
Did not need this encounter °

## 2020-10-26 ENCOUNTER — Telehealth: Payer: Self-pay | Admitting: Internal Medicine

## 2020-10-26 NOTE — Telephone Encounter (Signed)
Spoke with pt and pt has noted the swelling to legs for sometime this is stable Pt has increased Furosemide to 40 mg qd and this has been going on for about a month had been taking MON WED and FRI Pt mainly called re jerking sensation to arms and hands this has been going on for 6 months but has been occurring more often here recently Encouraged pt to call PCP to see about getting referral to neuro Per pt is not pleased with PCP or office at this time this faciltiy is suppose to provide house calls and the last appt the person never showed Will forward to Dr Debara Pickett for review .Adonis Housekeeper

## 2020-10-26 NOTE — Telephone Encounter (Signed)
Pt c/o swelling: STAT is pt has developed SOB within 24 hours  If swelling, where is the swelling located? Mainly legs  How much weight have you gained and in what time span? 1-2 pounds - unsure of the time span  Have you gained 3 pounds in a day or 5 pounds in a week? no  Do you have a log of your daily weights (if so, list)? no  Are you currently taking a fluid pill? yes  Are you currently SOB? States he is always short of breath with any activity  Have you traveled recently? No   Patient sent message to scheduling regarding his swelling.

## 2020-10-27 NOTE — Telephone Encounter (Signed)
Agree with PCP follow-up for referral.  Dr Lemmie Evens

## 2020-10-28 DIAGNOSIS — Z950 Presence of cardiac pacemaker: Secondary | ICD-10-CM | POA: Diagnosis not present

## 2020-10-28 DIAGNOSIS — F32A Depression, unspecified: Secondary | ICD-10-CM | POA: Diagnosis not present

## 2020-10-28 DIAGNOSIS — Z0001 Encounter for general adult medical examination with abnormal findings: Secondary | ICD-10-CM | POA: Diagnosis not present

## 2020-10-28 DIAGNOSIS — I10 Essential (primary) hypertension: Secondary | ICD-10-CM | POA: Diagnosis not present

## 2020-10-28 DIAGNOSIS — R259 Unspecified abnormal involuntary movements: Secondary | ICD-10-CM | POA: Diagnosis not present

## 2020-10-28 DIAGNOSIS — Z7189 Other specified counseling: Secondary | ICD-10-CM | POA: Diagnosis not present

## 2020-10-28 DIAGNOSIS — M6281 Muscle weakness (generalized): Secondary | ICD-10-CM | POA: Diagnosis not present

## 2020-10-28 DIAGNOSIS — E119 Type 2 diabetes mellitus without complications: Secondary | ICD-10-CM | POA: Diagnosis not present

## 2020-10-28 DIAGNOSIS — C4492 Squamous cell carcinoma of skin, unspecified: Secondary | ICD-10-CM | POA: Diagnosis not present

## 2020-11-01 NOTE — Telephone Encounter (Signed)
Spoke with pt's wife and pt did see PCP last week and is being referred to Neuro./cy

## 2020-11-01 NOTE — Telephone Encounter (Signed)
Lm to call back ./cy 

## 2020-11-07 ENCOUNTER — Telehealth: Payer: Self-pay | Admitting: Dermatology

## 2020-11-07 NOTE — Telephone Encounter (Signed)
Patient's wife, Lelon Frohlich, is calling to say that Timmothy Sours had Moh's surgery (Dr. Link Snuffer) about 2 months age and the surgical site is bleeding some and would like Lavonna Monarch, M.D. to see him.  I explained that until he is released to come back to Dr. Denna Haggard he really needs to call back to Dr. Link Snuffer.  Also, Lelon Frohlich says that the skin from Don's ear seems to be adhering to his head so that he cannot put on his glasses and so would like that addressed as well.  Again I explained that that would have to be addressed at [facility] since he is under their care.

## 2020-11-08 DIAGNOSIS — N1832 Chronic kidney disease, stage 3b: Secondary | ICD-10-CM | POA: Diagnosis not present

## 2020-11-08 DIAGNOSIS — E1122 Type 2 diabetes mellitus with diabetic chronic kidney disease: Secondary | ICD-10-CM | POA: Diagnosis not present

## 2020-11-08 DIAGNOSIS — N281 Cyst of kidney, acquired: Secondary | ICD-10-CM | POA: Diagnosis not present

## 2020-11-08 DIAGNOSIS — I5032 Chronic diastolic (congestive) heart failure: Secondary | ICD-10-CM | POA: Diagnosis not present

## 2020-11-08 DIAGNOSIS — E559 Vitamin D deficiency, unspecified: Secondary | ICD-10-CM | POA: Diagnosis not present

## 2020-11-08 DIAGNOSIS — I129 Hypertensive chronic kidney disease with stage 1 through stage 4 chronic kidney disease, or unspecified chronic kidney disease: Secondary | ICD-10-CM | POA: Diagnosis not present

## 2020-11-12 ENCOUNTER — Other Ambulatory Visit: Payer: Self-pay | Admitting: Internal Medicine

## 2020-11-12 DIAGNOSIS — M1990 Unspecified osteoarthritis, unspecified site: Secondary | ICD-10-CM

## 2020-11-24 ENCOUNTER — Other Ambulatory Visit: Payer: Self-pay

## 2020-11-24 ENCOUNTER — Ambulatory Visit: Payer: Medicare PPO | Admitting: Internal Medicine

## 2020-11-24 ENCOUNTER — Encounter: Payer: Self-pay | Admitting: Internal Medicine

## 2020-11-24 VITALS — BP 116/60 | HR 69 | Ht 64.0 in | Wt 205.8 lb

## 2020-11-24 DIAGNOSIS — I77810 Thoracic aortic ectasia: Secondary | ICD-10-CM

## 2020-11-24 DIAGNOSIS — Z950 Presence of cardiac pacemaker: Secondary | ICD-10-CM | POA: Diagnosis not present

## 2020-11-24 DIAGNOSIS — I480 Paroxysmal atrial fibrillation: Secondary | ICD-10-CM | POA: Diagnosis not present

## 2020-11-24 DIAGNOSIS — I442 Atrioventricular block, complete: Secondary | ICD-10-CM

## 2020-11-24 DIAGNOSIS — N183 Chronic kidney disease, stage 3 unspecified: Secondary | ICD-10-CM

## 2020-11-24 NOTE — Patient Instructions (Signed)
Medication Instructions:  Your physician recommends that you continue on your current medications as directed. Please refer to the Current Medication list given to you today.  *If you need a refill on your cardiac medications before your next appointment, please call your pharmacy*   Follow-Up: At [facility], you and your health needs are our priority.  As part of our continuing mission to provide you with exceptional heart care, we have created designated Provider Care Teams.  These Care Teams include your primary Cardiologist (physician) and Advanced Practice Providers (APPs -  Physician Assistants and Nurse Practitioners) who all work together to provide you with the care you need, when you need it.  We recommend signing up for the patient portal called "MyChart".  Sign up information is provided on this After Visit Summary.  MyChart is used to connect with patients for Virtual Visits (Telemedicine).  Patients are able to view lab/test results, encounter notes, upcoming appointments, etc.  Non-urgent messages can be sent to your provider as well.   To learn more about what you can do with MyChart, go to [email or web address].    Your next appointment:   6 month(s)  The format for your next appointment:   In Person  Provider:   You may see Pixie Casino, MD or one of the following Advanced Practice Providers on your designated Care Team:   Almyra Deforest, PA-C Fabian Sharp, [place] or  Roby Lofts, PA-C   Other Instructions   [facility] and Adult Medicine [address] [place],  [place]  [address]  Main: [phone]

## 2020-11-24 NOTE — Progress Notes (Signed)
OFFICE NOTE  Chief Complaint:  Memory issues  Primary Care Physician: Clovia Cuff, MD  HPI:  Gary [name] is a 87 year old gentleman who has a history of coronary disease, was previously followed by Dr. Rex Kras. In 2011 he had a nonSTEMI with an occluded circumflex artery and collaterals. Therefore, he has been treated medically. He has done pretty well with that. He did have chest pain in 2013 and ended up having gallbladder disease and had that removed in March 2013. From time to time, he complains of some fatigue and has been intolerant of Bystolic, was switched to bisoprolol, seems to be tolerating that pretty well. There is also concern about some lower extremity edema which he has at the sock line but does take furosemide and seems to be helping with that. Blood pressure seems to be pretty well controlled and he does do some exercise a couple times a week and is currently denying any chest pain, worsening shortness of breath, palpitations, presyncope or syncopal symptoms. He underwent back surgery this past year for back pain in June by Dr. Belinda Block and still reports problems with sciatic type nerve pain down the right leg. He currently was in the courtesy room in July with chest pain, ruled out for MI by troponins.  Chest pain at that time was felt to be atypical. He has not had recurrence and did not have any complications with his surgery. He currently denies any chest pain or shortness of breath with exertion. He did inquire as to whether it was safe to continue to take AndroGel, which should be okay for him.  His only other concern is some positional dizziness at night.  Gary [name] returns today for follow-up. He is complaining of left hip pain is been undergoing some injections. He is considering left hip replacement. He seems to have done fairly well from the right hip surgery. He does report some mild shortness of breath with exertion but is not as active as he could be. Again he  has known coronary disease with an occluded circumflex and collateral vessels found by catheterization in 2011. He has not had recurrent testing since that time. He is also reporting difficulty reaching climax during sex. He has a history of low testosterone on medication and previously was seeing Dr. Gaynelle Arabian.  10/31/2015   Gary [name] returns today for follow-up. He reports that he ultimately did not undergo hip surgery because of concern for recovery. He reported his orthopedist was concerned about doing surgery because he had an extended hospital course secondary to C. difficile colitis which complicated his hip surgery in the past. He is similar problems again today including problems with short-term memory. He reports intermittent Unk Lightning with depression and is not sure that the Celexa he is taking his helping him much. He also reports anorgasmia. As mentioned before is a history of low testosterone. This may be related to medications, polypharmacy or depression. From a cardiac standpoint he currently denies any chest pain. He did have upper abdominal pain which was reported as chest pain and visited the emergency department April of this year. Extensive workup was negative. Stress testing last fall indicated no ischemia and a preserved EF with small area of scar which is known.  05/14/2016  Gary [name] was seen today in follow-up. Recently he underwent some cataract surgery. He denies any chest pain or worsenhoseing shortness of breath. He had what sounds like a presyncopal episode. He was standing for about 20 minutes in one  position talking to his neighbor holding a leaf blower. Apparently he became a little dizzy or presyncopal and fell to his left side striking his left pectoralis area on a car meter. He remembers the event. He did not fall to the ground. He's had no further events. There was no associated chest pain or heart racing. He does have a bruise over the left pectoralis muscle. He takes  Lasix every day for unknown reasons and has no significant swelling.  12/31/2016  Gary [name] returns today for follow-up.  He still has trouble with his hips.  He denies any chest pain or worsening shortness of breath.  Initially his blood pressure was elevated 162/78 however recheck came down to 142/70.  EKG shows sinus bradycardia with nonspecific ST and T wave changes.  He has had no recent chest pain, dizziness or presyncopal episodes.  10/03/2017  Gary [name] was seen today in follow-up.  Recently he had sent a message saying that he was concerned about his ongoing fatigue and depression and wondered if this could be related to a sleep disorder.  His primary care physician suggested he should have a sleep study.  Actually think this is quite reasonable.  He does have a history of A. fib and palpitations in the past.  Recently he has had some palpitations although infrequent.  He does report very poor exercise tolerance and general fatigue.  11/24/2020  Gary [name] returns today for follow-up.  I last saw her via virtual visit earlier in the year.  He had had some worsening shortness of breath.  Repeat echo was ordered and showed normal systolic function and some mild mitral stenosis.  He has had recurrent pacemaker checks which showed normal function.  Blood pressure is excellent today.  He denies any worsening shortness of breath.  He has had some memory issues reports some intermittent jerking of his left arm.  He is scheduled to see the neurologist in December.  He is also interested in establishing with a new primary care provider.  PMHx:  Past Medical History:  Diagnosis Date   Adenomatous colon polyp    2010    Anemia    Anxiety    Arthritis    "hips and lower back"   Arthritis pain    Atypical mole 06/21/2003   left neck, inferior - slight to moderate atypia   Basal cell carcinoma 10/04/2014   right shin bcc (tx cx3, 31f )   Blood transfusion    Chest pain 03/29/11   2D Echo  without contrast on 03/29/11 - EF= 55-60%.   Cholecystitis    Chronic back pain greater than 3 months duration    Chronic kidney disease    Colon polyps    Complication of anesthesia    once slow to wake up   Coronary angioplasty status 2011   Coronary artery disease    Diarrhea    Diverticulosis    Dyslipidemia    GERD (gastroesophageal reflux disease)    GI bleeding after 07/2009   "triggered by Plavix & ASA; from my diverticulosis"   H/O Clostridium difficile infection    H/O myocardial perfusion scan 04/04/11   For Pre-noncardiac surgery - EF = 67%, no ischemia, and is considered low risk.   Heart attack ([place] 07/2009   nonSTEMI with an occluded circumflex artery and collaterals.   Heart murmur    Hemorrhoids    History of shingles    Hyperlipidemia    Hypertension    Kidney stones  Lower extremity edema    Taking furosemide and it seems to be helping.   Neuromuscular disorder ([place])    Nonspecific chest pain 08/26/2012   Went to ED 08/26/12   Squamous cell carcinoma of skin 05/13/2000   Right outer forehead   Squamous cell carcinoma of skin 03/02/2001   Post crown - tx 03/19/2001   Squamous cell carcinoma of skin 09/01/2001   Left post auricular - tx 10/09/2001   Squamous cell carcinoma of skin 06/21/2003   Top right ear - tx 07/05/2003   Squamous cell carcinoma of skin 06/21/2003   Right upper arm - tx 07/05/2003   Squamous cell carcinoma of skin 10/12/2003   Left superior ear lobe - MOHs   Squamous cell carcinoma of skin 12/15/2006   Right ear, superior - tx 01/26/2007   Squamous cell carcinoma of skin 03/13/2009   Right temple - tx 05/08/2009   Squamous cell carcinoma of skin 03/24/2013   Upper left forearm - tx p bx   Squamous cell carcinoma of skin 12/06/2013   Right forearm - CX3 + 5FU   Squamous cell carcinoma of skin 02/10/2014   Right hand - tx p bx   Squamous cell carcinoma of skin 10/04/2014   Right shin - tx p bx   Squamous cell carcinoma of skin  08/21/2016   Left hand - tx p bx   Squamous cell carcinoma of skin 01/01/2017   Right hand - tx 02/06/2017   Squamous cell carcinoma of skin 10/21/2017   Left side of scalp - tx 03/12/2018   Squamous cell carcinoma of skin 10/21/2017   Left post scalp, inf - tx 08/13/2018   Squamous cell carcinoma of skin 10/21/2017   Right scalp - tx p bx   Squamous cell carcinoma of skin 10/21/2017   Left cheek - tx 03/12/2018   Squamous cell carcinoma of skin 08/13/2018   Left top hand, proximal pinky - tx p bx   Squamous cell carcinoma of skin 12/23/2018   Right bicep lower - tx p bx   Squamous cell carcinoma of skin 05/18/2019   Right 4th finger metacarpophalangeal joint   Squamous cell carcinoma of skin 05/18/2019   Left 5th finger metacarpophalangeal joint   Squamous cell carcinoma of skin 05/18/2019   Left 2nd finger metacarpophalangeal joint   Stomach problems    Syncope and collapse 04/16/2018    Past Surgical History:  Procedure Laterality Date   [place]  08/09/2009   Circumflex 100% occluded with right-to-left collaterals, mild irregularities in the proximal and distal LAD and diagonal system, normal LV systolic function, and minor infrarenal irregularities, but no abdominal aortic aneurysm.   CHOLECYSTECTOMY  04/12/2011   Procedure: CHOLECYSTECTOMY;  Surgeon: Adin Hector, MD;  Location: [facility];  Service: General;  Laterality: N/A;   CHOLECYSTECTOMY  04/12/2011   Procedure: LAPAROSCOPIC CHOLECYSTECTOMY;  Surgeon: Adin Hector, MD;  Location: [facility];  Service: General;  Laterality: N/A;  converted to open   COLON SURGERY     COLONOSCOPY   ESOPHAGOGASTRODUODENOSCOPY N/A 08/05/2013   Procedure: ESOPHAGOGASTRODUODENOSCOPY (EGD);  Surgeon: Jerene Bears, MD;  Location: [facility];  Service: Gastroenterology;  Laterality: N/A;   EYE SURGERY  1942   "right eye was crossed; they  corrected it"   LUMBAR FUSION  07/22/2012   PACEMAKER IMPLANT N/A  04/17/2018   Procedure: PACEMAKER IMPLANT;  Surgeon: Deboraha Sprang, MD;  Location: [facility];  Service: Cardiovascular;  Laterality: N/A;   POSTERIOR FUSION LUMBAR SPINE  08/1995   "bottom 4 vertebra"   SUBTOTAL COLECTOMY  01/24/2010   TOTAL HIP ARTHROPLASTY Right 08/03/2013   Procedure: TOTAL HIP ARTHROPLASTY ANTERIOR APPROACH;  Surgeon: Hessie Dibble, MD;  Location: [facility];  Service: Orthopedics;  Laterality: Right;    FAMHx:  Family History  Problem Relation Age of Onset   Heart disease Father    Stroke Father    Pancreatic cancer Mother    Diverticulosis Sister        x4   Colon cancer Neg Hx     SOCHx:   reports that he has never smoked. He has never used smokeless tobacco. He reports that he does not drink alcohol and does not use drugs.  ALLERGIES:  Allergies  Allergen Reactions   Naproxen Rash   Clopidogrel Bisulfate Other (See Comments)    H/O diverticulitis; prone to rectal bleeding.  Plavix complicated this.  jkl   Sulfonamide Derivatives Hives   Other Other (See Comments)    Narcotics-C. Diff   Latex Rash   Tape Itching and Rash    Latex-Adhesive tape    ROS: Pertinent items noted in HPI and remainder of comprehensive ROS otherwise negative.  HOME MEDS: Current Outpatient Medications  Medication Sig Dispense Refill   acetaminophen (TYLENOL) 500 MG tablet Take 1,000 mg by mouth every 8 (eight) hours as needed for fever.     amLODipine (NORVASC) 5 MG tablet TAKE 1 TABLET BY MOUTH EVERY DAY 90 tablet 3   aspirin EC 81 MG tablet Take 81 mg by mouth daily.     blood glucose meter kit and supplies KIT Dispense based on patient and insurance preference. Use up to four times daily as directed. (FOR ICD-9 250.00, 250.01). 1 each 0   carvedilol (COREG) 3.125 MG tablet TAKE 1 TABLET (3.125 MG TOTAL) BY MOUTH 2 (TWO) TIMES DAILY WITH A MEAL. 180 tablet 1   citalopram (CELEXA) 40 MG tablet Take 1 tablet (40 mg total) by mouth daily. 90 tablet 1   doxazosin  (CARDURA) 4 MG tablet Take 4 mg by mouth daily.     ergocalciferol (VITAMIN D2) 1.25 MG (50000 UT) capsule ergocalciferol (vitamin D2) 1,250 mcg (50,000 unit) capsule  TAKE 1 CAPSULE BY MOUTH ONE TIME PER WEEK     fluticasone (FLONASE) 50 MCG/ACT nasal spray Place 2 sprays into both nostrils daily.     furosemide (LASIX) 40 MG tablet Take 1 tablet (40 mg total) by mouth daily. Monday,wednesday and friday     mupirocin ointment (BACTROBAN) 2 % Apply 1 application topically 2 (two) times daily. 22 g 2   pregabalin (LYRICA) 75 MG capsule Take 1 capsule (75 mg total) by mouth 3 (three) times daily. 90 capsule 3   simvastatin (ZOCOR) 40 MG tablet TAKE 1 TABLET BY MOUTH AT BEDTIME (Patient taking differently: Take 40 mg by mouth at bedtime.) 90 tablet 2   Tamsulosin HCl (FLOMAX) 0.4 MG CAPS Take 0.4 mg by mouth daily after supper.     traMADol (ULTRAM) 50 MG tablet      vitamin B-12 1000 MCG tablet Take 1 tablet (1,000 mcg total) by mouth daily. 3 tablet    diclofenac Sodium (VOLTAREN) 1 % GEL APPLY 2 G TO AFFECTED AREA 4 TIMES A DAY (Patient not taking: Reported on 11/24/2020) 200 g 0   nitroGLYCERIN (NITROSTAT) 0.4 MG SL tablet PLACE 1 TABLET UNDER THE TONGUE EVERY 5 MINUTES AS NEEDED FOR  CHEST PAIN FOR 3 DOSES (Patient taking differently: Place 0.4 mg under the tongue every 5 (five) minutes as needed for chest pain.) 25 tablet 0   No current facility-administered medications for this visit.    LABS/IMAGING: No results found for this or any previous visit (from the past 48 hour(s)). No results found.  VITALS: BP 116/60 (BP Location: Left Arm, Patient Position: Sitting, Cuff Size: Large)   Pulse 69   Ht _0  (1.626 m)   Wt 205 lb 12.8 oz (93.4 kg)   SpO2 95%   BMI 35.33 kg/m   EXAM: General appearance: alert and no distress Neck: no carotid bruit and no JVD Lungs: clear to auscultation bilaterally Heart: regular rate and rhythm, S1, S2 normal, no murmur, click, rub or gallop Abdomen:  soft, non-tender; bowel sounds normal; no masses,  no organomegaly Extremities: extremities normal, atraumatic, no cyanosis or edema and Bruising over the left pectoralis muscle Pulses: 2+ and symmetric Skin: Skin color, texture, turgor normal. No rashes or lesions Neurologic: Grossly normal Psych: Mood, affect normal  EKG: Ventricular paced rhythm at 69-personally reviewed  ASSESSMENT: Coronary artery disease status post occluded circumflex in 2011 with collaterals Hypertension-controlled Dyslipidemia-at goal Legs/back pain - s/p R THR, now contemplating left hip replacement Indeterminate risk for hip surgery ?PAF - related to sepsis, less than 5 minutes duration Depression Anorgasmia Short-term memory loss Fatigue, nonrestorative sleep, snoring-possible sleep apnea Normal LVEF, mild mitral stenosis and dilated aorta 41 mm (06/2020)  PLAN: 1.   Mr. Bramhall continues to do well.  He denies any chest pain or worsening shortness of breath.  Echo shows stable normal systolic function.  There is some mild mitral stenosis and some mild dilatation of the aorta to 41 mm.  We will plan a repeat echo next year.  Follow-up with me in 6 months or sooner as necessary.  Pixie Casino, MD, [facility], [place] Director of the [facility]  [facility] Attending Cardiologist  Direct Dial: [phone]  Fax: [phone]  Website:  [email or web address]

## 2020-12-12 ENCOUNTER — Other Ambulatory Visit: Payer: Self-pay | Admitting: Internal Medicine

## 2020-12-12 DIAGNOSIS — M1990 Unspecified osteoarthritis, unspecified site: Secondary | ICD-10-CM

## 2020-12-29 ENCOUNTER — Ambulatory Visit (INDEPENDENT_AMBULATORY_CARE_PROVIDER_SITE_OTHER): Payer: Medicare PPO

## 2020-12-29 DIAGNOSIS — I442 Atrioventricular block, complete: Secondary | ICD-10-CM

## 2020-12-29 LAB — CUP PACEART REMOTE DEVICE CHECK
Battery Remaining Longevity: 113 mo
Battery Voltage: 3 V
Brady Statistic AP VP Percent: 31.16 %
Brady Statistic AP VS Percent: 0.01 %
Brady Statistic AS VP Percent: 68.74 %
Brady Statistic AS VS Percent: 0.1 %
Brady Statistic RA Percent Paced: 30.94 %
Brady Statistic RV Percent Paced: 99.9 %
Date Time Interrogation Session: 20221117222025
Implantable Lead Implant Date: 20200306
Implantable Lead Implant Date: 20200306
Implantable Lead Location: 753859
Implantable Lead Location: 753860
Implantable Lead Model: 5076
Implantable Lead Model: 5076
Implantable Pulse Generator Implant Date: 20200306
Lead Channel Impedance Value: 323 Ohm
Lead Channel Impedance Value: 361 Ohm
Lead Channel Impedance Value: 380 Ohm
Lead Channel Impedance Value: 437 Ohm
Lead Channel Pacing Threshold Amplitude: 0.5 V
Lead Channel Pacing Threshold Amplitude: 0.5 V
Lead Channel Pacing Threshold Pulse Width: 0.4 ms
Lead Channel Pacing Threshold Pulse Width: 0.4 ms
Lead Channel Sensing Intrinsic Amplitude: 0.875 mV
Lead Channel Sensing Intrinsic Amplitude: 0.875 mV
Lead Channel Sensing Intrinsic Amplitude: 9.875 mV
Lead Channel Sensing Intrinsic Amplitude: 9.875 mV
Lead Channel Setting Pacing Amplitude: 1.5 V
Lead Channel Setting Pacing Amplitude: 2.5 V
Lead Channel Setting Pacing Pulse Width: 0.4 ms
Lead Channel Setting Sensing Sensitivity: 0.6 mV

## 2021-01-08 NOTE — Progress Notes (Signed)
Remote pacemaker transmission.   

## 2021-01-11 ENCOUNTER — Other Ambulatory Visit: Payer: Self-pay | Admitting: *Deleted

## 2021-01-15 ENCOUNTER — Ambulatory Visit: Payer: Medicare PPO | Admitting: Diagnostic Neuroimaging

## 2021-01-15 ENCOUNTER — Telehealth: Payer: Self-pay | Admitting: Diagnostic Neuroimaging

## 2021-01-15 ENCOUNTER — Encounter: Payer: Self-pay | Admitting: Internal Medicine

## 2021-01-15 NOTE — Telephone Encounter (Signed)
Called patient in regards to MyChart message He cancelled his neuro appt for today - stomach issues He reports jerking issues with arms/legs -- advised he call [facility] to r/s He is asking about providers that MD mentioned at last visit - on AVS, [facility] is noted Provided him with this office info

## 2021-01-15 NOTE — Telephone Encounter (Signed)
Noted  

## 2021-01-15 NOTE — Telephone Encounter (Signed)
Pt's wife, Lazaro Isenhower cancelled appt for today with Dr. Leta Baptist

## 2021-02-08 ENCOUNTER — Encounter: Payer: Self-pay | Admitting: Dermatology

## 2021-02-08 ENCOUNTER — Ambulatory Visit: Payer: Medicare PPO | Admitting: Dermatology

## 2021-02-08 ENCOUNTER — Other Ambulatory Visit: Payer: Self-pay

## 2021-02-08 DIAGNOSIS — C44622 Squamous cell carcinoma of skin of right upper limb, including shoulder: Secondary | ICD-10-CM

## 2021-02-08 DIAGNOSIS — C4491 Basal cell carcinoma of skin, unspecified: Secondary | ICD-10-CM

## 2021-02-08 DIAGNOSIS — L02818 Cutaneous abscess of other sites: Secondary | ICD-10-CM | POA: Diagnosis not present

## 2021-02-08 DIAGNOSIS — C4492 Squamous cell carcinoma of skin, unspecified: Secondary | ICD-10-CM

## 2021-02-08 DIAGNOSIS — D0461 Carcinoma in situ of skin of right upper limb, including shoulder: Secondary | ICD-10-CM

## 2021-02-08 DIAGNOSIS — L600 Ingrowing nail: Secondary | ICD-10-CM

## 2021-02-08 DIAGNOSIS — C44612 Basal cell carcinoma of skin of right upper limb, including shoulder: Secondary | ICD-10-CM

## 2021-02-08 DIAGNOSIS — L820 Inflamed seborrheic keratosis: Secondary | ICD-10-CM | POA: Diagnosis not present

## 2021-02-08 DIAGNOSIS — D485 Neoplasm of uncertain behavior of skin: Secondary | ICD-10-CM

## 2021-02-08 DIAGNOSIS — L570 Actinic keratosis: Secondary | ICD-10-CM | POA: Diagnosis not present

## 2021-02-08 HISTORY — DX: Squamous cell carcinoma of skin, unspecified: C44.92

## 2021-02-08 HISTORY — DX: Basal cell carcinoma of skin, unspecified: C44.91

## 2021-02-08 NOTE — Patient Instructions (Signed)

## 2021-02-15 ENCOUNTER — Telehealth: Payer: Self-pay | Admitting: Dermatology

## 2021-02-15 ENCOUNTER — Encounter: Payer: Self-pay | Admitting: Dermatology

## 2021-02-15 NOTE — Telephone Encounter (Signed)
Patient is calling for pathology results from last visit with Stuart Tafeen, MD 

## 2021-02-15 NOTE — Telephone Encounter (Signed)
Phone call to patient with is pathology results. Patient aware of results.

## 2021-02-19 ENCOUNTER — Telehealth: Payer: Self-pay | Admitting: Diagnostic Neuroimaging

## 2021-02-19 NOTE — Telephone Encounter (Signed)
MD schedule change- LVM and sent mychart msg informing pt of r/s needed.

## 2021-02-27 ENCOUNTER — Ambulatory Visit: Payer: Medicare PPO | Admitting: Diagnostic Neuroimaging

## 2021-02-27 ENCOUNTER — Encounter: Payer: Self-pay | Admitting: Diagnostic Neuroimaging

## 2021-02-27 VITALS — BP 129/63 | HR 77 | Ht 64.0 in

## 2021-02-27 DIAGNOSIS — R269 Unspecified abnormalities of gait and mobility: Secondary | ICD-10-CM | POA: Diagnosis not present

## 2021-02-27 NOTE — Patient Instructions (Addendum)
°  GAIT DIFFICULTY (due to lumbar spine dz / surgery, right hip replacement, left hip arthritis pain, diabetic neuropathy) - refer to PT evaluation - may need home care aid   MILD MEMORY LOSS (likely MCI vs normal aging) - safety / supervision issues reviewed - daily physical activity / exercise (at least 15-30 minutes) - eat more plants / vegetables - increase social activities, brain stimulation, games, puzzles, hobbies, crafts, arts, music - aim for at least 7-8 hours sleep per night (or more) - avoid smoking and alcohol - caregiver resources provided - caution with medications, finances; no driving

## 2021-02-27 NOTE — Progress Notes (Signed)
[facility]  PATIENT: Gary [name] DOB: 1933-02-09  REFERRING CLINICIAN: Clovia Cuff, MD HISTORY FROM: patient  REASON FOR VISIT: new consult    HISTORICAL  CHIEF COMPLAINT:  Chief Complaint  Patient presents with   Parkinsonism    Rm 6 New Pt  wife- Lelon Frohlich "want to do PT here due to leg weakness, has been > 2 years since I had therapy, I use walker at home"     HISTORY OF PRESENT ILLNESS:   88 year old male here for evaluation of mild memory loss, muscle jerks, pain in legs.    Symptoms have been going on for at least past 1 to 2 years, possibly longer.  He has had generalized malaise, gait and balance difficulty worsening over the past several years.  He transition from walking independently to cane, walker.  He has had several falls.  Patient also has some hearing impairment and mild memory loss.  His long-term memory is quite good.  Sometimes he has some mild short-term memory problems, but not significant affecting his day-to-day activities.  His ADLs are mainly affected by his mobility issues.   REVIEW OF SYSTEMS: Full 14 system review of systems performed and negative with exception of: as per HPI.   ALLERGIES: Allergies  Allergen Reactions   Naproxen Rash   Clopidogrel Bisulfate Other (See Comments)    H/O diverticulitis; prone to rectal bleeding.  Plavix complicated this.  jkl   Sulfonamide Derivatives Hives   Other Other (See Comments)    Narcotics-C. Diff   Latex Rash   Tape Itching and Rash    Latex-Adhesive tape    HOME MEDICATIONS: Outpatient Medications Prior to Visit  Medication Sig Dispense Refill   acetaminophen (TYLENOL) 500 MG tablet Take 1,000 mg by mouth every 8 (eight) hours as needed for fever.     amLODipine (NORVASC) 5 MG tablet TAKE 1 TABLET BY MOUTH EVERY DAY 90 tablet 3   aspirin EC 81 MG tablet Take 81 mg by mouth daily.     blood glucose meter kit and supplies KIT Dispense based on patient and insurance  preference. Use up to four times daily as directed. (FOR ICD-9 250.00, 250.01). 1 each 0   carvedilol (COREG) 3.125 MG tablet TAKE 1 TABLET (3.125 MG TOTAL) BY MOUTH 2 (TWO) TIMES DAILY WITH A MEAL. 180 tablet 1   citalopram (CELEXA) 40 MG tablet Take 1 tablet (40 mg total) by mouth daily. 90 tablet 1   diclofenac Sodium (VOLTAREN) 1 % GEL APPLY 2 G TO AFFECTED AREA 4 TIMES A DAY 200 g 0   doxazosin (CARDURA) 4 MG tablet Take 4 mg by mouth daily.     ergocalciferol (VITAMIN D2) 1.25 MG (50000 UT) capsule ergocalciferol (vitamin D2) 1,250 mcg (50,000 unit) capsule  TAKE 1 CAPSULE BY MOUTH ONE TIME PER WEEK     fluticasone (FLONASE) 50 MCG/ACT nasal spray Place 2 sprays into both nostrils daily.     furosemide (LASIX) 40 MG tablet Take 1 tablet (40 mg total) by mouth daily. Monday,wednesday and friday     mupirocin ointment (BACTROBAN) 2 % Apply 1 application topically 2 (two) times daily. 22 g 2   nitroGLYCERIN (NITROSTAT) 0.4 MG SL tablet PLACE 1 TABLET UNDER THE TONGUE EVERY 5 MINUTES AS NEEDED FOR CHEST PAIN FOR 3 DOSES (Patient taking differently: Place 0.4 mg under the tongue every 5 (five) minutes as needed for chest pain.) 25 tablet 0   pregabalin (LYRICA) 75 MG capsule Take 1 capsule (  75 mg total) by mouth 3 (three) times daily. 90 capsule 3   simvastatin (ZOCOR) 40 MG tablet TAKE 1 TABLET BY MOUTH AT BEDTIME (Patient taking differently: Take 40 mg by mouth at bedtime.) 90 tablet 2   Tamsulosin HCl (FLOMAX) 0.4 MG CAPS Take 0.4 mg by mouth daily after supper.     traMADol (ULTRAM) 50 MG tablet      vitamin B-12 1000 MCG tablet Take 1 tablet (1,000 mcg total) by mouth daily. 3 tablet    busPIRone (BUSPAR) 10 MG tablet buspirone 10 mg tablet  TAKE 1 TABLET BY MOUTH TWICE A DAY (Patient not taking: Reported on 02/27/2021)     No facility-administered medications prior to visit.    PAST MEDICAL HISTORY: Past Medical History:  Diagnosis Date   Adenomatous colon polyp    2010    Anemia     Anxiety    Arthritis    "hips and lower back"   Arthritis pain    Atypical mole 06/21/2003   left neck, inferior - slight to moderate atypia   Basal cell carcinoma 10/04/2014   right shin bcc (tx cx3, 62fu )   Blood transfusion    Chest pain 03/29/2011   2D Echo without contrast on 03/29/11 - EF= 55-60%.   Cholecystitis    Chronic back pain greater than 3 months duration    Chronic kidney disease    Colon polyps    Complication of anesthesia    once slow to wake up   Coronary angioplasty status 2011   Coronary artery disease    Diarrhea    Diverticulosis    Dyslipidemia    GERD (gastroesophageal reflux disease)    GI bleeding after 07/2009   "triggered by Plavix & ASA; from my diverticulosis"   H/O Clostridium difficile infection    H/O myocardial perfusion scan 04/04/2011   For Pre-noncardiac surgery - EF = 67%, no ischemia, and is considered low risk.   Heart attack ([place]) 07/2009   nonSTEMI with an occluded circumflex artery and collaterals.   Heart murmur    Hemorrhoids    History of shingles    Hyperlipidemia    Hypertension    Kidney stones    Lower extremity edema    Taking furosemide and it seems to be helping.   Neuromuscular disorder ([place])    Nodular infiltrative basal cell carcinoma (BCC) 02/08/2021   Right Forearm-anterior (tx p bx)   Nonspecific chest pain 08/26/2012   Went to ED 08/26/12   Parkinsonism ([place])    SCCA (squamous cell carcinoma) of skin 02/08/2021   Right Dorsal Hand (well diff) (tx p bx)   Squamous cell carcinoma of skin 05/13/2000   Right outer forehead   Squamous cell carcinoma of skin 03/02/2001   Post crown - tx 03/19/2001   Squamous cell carcinoma of skin 09/01/2001   Left post auricular - tx 10/09/2001   Squamous cell carcinoma of skin 06/21/2003   Top right ear - tx 07/05/2003   Squamous cell carcinoma of skin 06/21/2003   Right upper arm - tx 07/05/2003   Squamous cell carcinoma of skin 10/12/2003   Left superior ear lobe - MOHs    Squamous cell carcinoma of skin 12/15/2006   Right ear, superior - tx 01/26/2007   Squamous cell carcinoma of skin 03/13/2009   Right temple - tx 05/08/2009   Squamous cell carcinoma of skin 03/24/2013   Upper left forearm - tx p bx   Squamous cell carcinoma of  skin 12/06/2013   Right forearm - CX3 + 5FU   Squamous cell carcinoma of skin 02/10/2014   Right hand - tx p bx   Squamous cell carcinoma of skin 10/04/2014   Right shin - tx p bx   Squamous cell carcinoma of skin 08/21/2016   Left hand - tx p bx   Squamous cell carcinoma of skin 01/01/2017   Right hand - tx 02/06/2017   Squamous cell carcinoma of skin 10/21/2017   Left side of scalp - tx 03/12/2018   Squamous cell carcinoma of skin 10/21/2017   Left post scalp, inf - tx 08/13/2018   Squamous cell carcinoma of skin 10/21/2017   Right scalp - tx p bx   Squamous cell carcinoma of skin 10/21/2017   Left cheek - tx 03/12/2018   Squamous cell carcinoma of skin 08/13/2018   Left top hand, proximal pinky - tx p bx   Squamous cell carcinoma of skin 12/23/2018   Right bicep lower - tx p bx   Squamous cell carcinoma of skin 05/18/2019   Right 4th finger metacarpophalangeal joint   Squamous cell carcinoma of skin 05/18/2019   Left 5th finger metacarpophalangeal joint   Squamous cell carcinoma of skin 05/18/2019   Left 2nd finger metacarpophalangeal joint   Stomach problems    Syncope and collapse 04/16/2018    PAST SURGICAL HISTORY: Past Surgical History:  Procedure Laterality Date   [place]  08/09/2009   Circumflex 100% occluded with right-to-left collaterals, mild irregularities in the proximal and distal LAD and diagonal system, normal LV systolic function, and minor infrarenal irregularities, but no abdominal aortic aneurysm.   CHOLECYSTECTOMY  04/12/2011   Procedure: CHOLECYSTECTOMY;  Surgeon: Adin Hector, MD;  Location: [place];  Service: General;  Laterality: N/A;    CHOLECYSTECTOMY  04/12/2011   Procedure: LAPAROSCOPIC CHOLECYSTECTOMY;  Surgeon: Adin Hector, MD;  Location: [place];  Service: General;  Laterality: N/A;  converted to open   COLON SURGERY     COLONOSCOPY   ESOPHAGOGASTRODUODENOSCOPY N/A 08/05/2013   Procedure: ESOPHAGOGASTRODUODENOSCOPY (EGD);  Surgeon: Jerene Bears, MD;  Location: [place];  Service: Gastroenterology;  Laterality: N/A;   EYE SURGERY  1942   "right eye was crossed; they  corrected it"   LUMBAR FUSION  07/22/2012   PACEMAKER IMPLANT N/A 04/17/2018   Procedure: PACEMAKER IMPLANT;  Surgeon: Deboraha Sprang, MD;  Location: [place] [facility];  Service: Cardiovascular;  Laterality: N/A;   POSTERIOR FUSION LUMBAR SPINE  08/1995   "bottom 4 vertebra"   SUBTOTAL COLECTOMY  01/24/2010   TOTAL HIP ARTHROPLASTY Right 08/03/2013   Procedure: TOTAL HIP ARTHROPLASTY ANTERIOR APPROACH;  Surgeon: Hessie Dibble, MD;  Location: [place];  Service: Orthopedics;  Laterality: Right;    FAMILY HISTORY: Family History  Problem Relation Age of Onset   Pancreatic cancer Mother    Heart disease Father    Stroke Father    Diverticulosis Sister        x4   Colon cancer Neg Hx     SOCIAL HISTORY: Social History   Socioeconomic History   Marital status: Married    Spouse name: Ann   Number of children: 2   Years of education: Not on file   Highest education level: Some college, no degree  Occupational History   Occupation: Art therapist   Occupation: retired    Comment: minister  Tobacco Use   Smoking status: Never   Smokeless  tobacco: Never  Vaping Use   Vaping Use: Never used  Substance and Sexual Activity   Alcohol use: No   Drug use: No   Sexual activity: Not Currently  Other Topics Concern   Not on file  Social History Narrative   Lives with wife   Daily caffeine use.   Social Determinants of Health   Financial Resource Strain: Not on file  Food Insecurity: Not on file  Transportation Needs: Not on file  Physical  Activity: Not on file  Stress: Not on file  Social Connections: Not on file  Intimate Partner Violence: Not on file     PHYSICAL EXAM  GENERAL EXAM/CONSTITUTIONAL: Vitals:  Vitals:   02/27/21 1024  BP: 129/63  Pulse: 77  Height: $Remove'5\' 4"'[id number]$  (1.626 m)   Body mass index is 35.33 kg/m. Wt Readings from Last 3 Encounters:  11/24/20 205 lb 12.8 oz (93.4 kg)  08/15/20 206 lb (93.4 kg)  05/11/20 208 lb (94.3 kg)   Patient is in no distress; well developed, nourished and groomed; neck is supple  CARDIOVASCULAR: Examination of carotid arteries is normal; no carotid bruits Regular rate and rhythm, no murmurs Examination of peripheral vascular system by observation and palpation is normal  EYES: Ophthalmoscopic exam of optic discs and posterior segments is normal; no papilledema or hemorrhages No results found.  MUSCULOSKELETAL: Gait, strength, tone, movements noted in Neurologic exam below  NEUROLOGIC: MENTAL STATUS:  No flowsheet data found. awake, alert, oriented to person, place and time recent and remote memory intact normal attention and concentration language fluent, comprehension intact, naming intact fund of knowledge appropriate  CRANIAL NERVE:  2nd - no papilledema on fundoscopic exam 2nd, 3rd, 4th, 6th - pupils equal and reactive to light, visual fields full to confrontation, extraocular muscles intact, no nystagmus 5th - facial sensation symmetric 7th - facial strength symmetric 8th - hearing intact 9th - palate elevates symmetrically, uvula midline 11th - shoulder shrug symmetric 12th - tongue protrusion midline MILD VOICE TREMOR  MOTOR:  normal bulk and tone BUE 5 except left biceps 4 RLE 5; LLE 3 PROX, 5 DISTAL  SENSORY:  normal and symmetric to light touch, temperature, vibration; DECR IN FEET  COORDINATION:  finger-nose-finger, fine finger movements normal  REFLEXES:  deep tendon reflexes TRACE and symmetric  GAIT/STATION:  narrow based gait;  LIMPING ON LEFT LEG; STOOPED POSTURE; UNSTEADY     DIAGNOSTIC DATA (LABS, IMAGING, TESTING) - I reviewed patient records, labs, notes, testing and imaging myself where available.  Lab Results  Component Value Date   WBC 6.8 08/15/2020   HGB 13.1 08/15/2020   HCT 40.5 08/15/2020   MCV 91.8 08/15/2020   PLT 157 08/15/2020      Component Value Date/Time   NA 136 08/15/2020 0751   NA 138 11/18/2019 1214   K 3.8 08/15/2020 0751   CL 100 08/15/2020 0751   CO2 29 08/15/2020 0751   GLUCOSE 169 (H) 08/15/2020 0751   BUN 19 08/15/2020 0751   BUN 23 11/18/2019 1214   CREATININE 1.58 (H) 08/15/2020 0751   CREATININE 1.63 (H) 03/04/2011 1537   CALCIUM 9.4 08/15/2020 0751   PROT 6.6 08/15/2020 1119   PROT 6.5 11/03/2019 1504   ALBUMIN 4.0 08/15/2020 1119   ALBUMIN 4.5 11/03/2019 1504   AST 22 08/15/2020 1119   ALT 12 08/15/2020 1119   ALKPHOS 30 (L) 08/15/2020 1119   BILITOT 1.0 08/15/2020 1119   BILITOT 0.4 11/03/2019 1504   GFRNONAA 42 (L) 08/15/2020  [place] (L) 11/18/2019 1214   Lab Results  Component Value Date   CHOL  08/09/2009    164        ATP III CLASSIFICATION:  <200     mg/dL   Desirable  200-239  mg/dL   Borderline High  >=240    mg/dL   High          HDL 36 (L) 08/09/2009   LDLCALC (H) 08/09/2009    105        Total Cholesterol/HDL:CHD Risk Coronary Heart Disease Risk Table                     Men   Women  1/2 Average Risk   3.4   3.3  Average Risk       5.0   4.4  2 X Average Risk   9.6   7.1  3 X Average Risk  23.4   11.0        Use the calculated Patient Ratio above and the CHD Risk Table to determine the patient's CHD Risk.        ATP III CLASSIFICATION (LDL):  <100     mg/dL   Optimal  100-129  mg/dL   Near or Above                    Optimal  130-159  mg/dL   Borderline  160-189  mg/dL   High  >190     mg/dL   Very High   TRIG 114 08/09/2009   CHOLHDL 4.6 08/09/2009   Lab Results  Component Value Date   HGBA1C 7.0 (H)  07/14/2019   Lab Results  Component Value Date   VITAMINB12 525 04/22/2019   Lab Results  Component Value Date   TSH 1.817 03/28/2019    03/27/19 CT head  - No evidence of acute intracranial abnormality. - Small vessel ischemic changes.    ASSESSMENT AND PLAN  88 y.o. year old male here with:  Dx:  1. Abnormality of gait and mobility     PLAN:  GAIT DIFFICULTY (due to lumbar spine dz / surgery, right hip replacement, left hip arthritis pain, diabetic neuropathy) - no signs of parkinson's at this time - refer to PT evaluation - may need home care aid  MILD MEMORY LOSS (likely MCI vs normal aging) - safety / supervision issues reviewed - daily physical activity / exercise (at least 15-30 minutes) - eat more plants / vegetables - increase social activities, brain stimulation, games, puzzles, hobbies, crafts, arts, music - aim for at least 7-8 hours sleep per night (or more) - avoid smoking and alcohol - caregiver resources provided - caution with medications, finances; no driving  Orders Placed This Encounter  Procedures   Ambulatory referral to Physical Therapy   Return for pending if symptoms worsen or fail to improve, return to PCP.    Penni Bombard, MD 2/39/5320, 23:34 AM Certified in Neurology, Neurophysiology and Neuroimaging  [facility] [address] [place], [place] [address] [phone]

## 2021-03-01 ENCOUNTER — Other Ambulatory Visit: Payer: Self-pay

## 2021-03-01 ENCOUNTER — Ambulatory Visit: Payer: Medicare PPO | Attending: Diagnostic Neuroimaging

## 2021-03-01 VITALS — HR 70

## 2021-03-01 DIAGNOSIS — R2681 Unsteadiness on feet: Secondary | ICD-10-CM | POA: Diagnosis present

## 2021-03-01 DIAGNOSIS — R269 Unspecified abnormalities of gait and mobility: Secondary | ICD-10-CM | POA: Diagnosis not present

## 2021-03-01 DIAGNOSIS — R2689 Other abnormalities of gait and mobility: Secondary | ICD-10-CM | POA: Insufficient documentation

## 2021-03-01 DIAGNOSIS — G8929 Other chronic pain: Secondary | ICD-10-CM | POA: Diagnosis present

## 2021-03-01 DIAGNOSIS — M545 Low back pain, unspecified: Secondary | ICD-10-CM | POA: Diagnosis present

## 2021-03-01 DIAGNOSIS — M6281 Muscle weakness (generalized): Secondary | ICD-10-CM | POA: Insufficient documentation

## 2021-03-01 NOTE — Therapy (Signed)
[facility] [address] [place], [place], [address] Phone: [phone]   Fax:  [phone]  Physical Therapy Evaluation  Patient Details  Name: Gary [name] MRN: [id number] Date of Birth: 06/24/1932 Referring Provider (PT): Andrey Spearman   Encounter Date: 03/01/2021   PT End of Session - 03/01/21 0937     Visit Number 1    Number of Visits 17    Date for PT Re-Evaluation 04/27/21    Authorization Type [employer] so 10th visit progress note    PT Start Time 0935    PT Stop Time 1020    PT Time Calculation (min) 45 min    Equipment Utilized During Treatment Gait belt    Activity Tolerance Patient tolerated treatment well    Behavior During Therapy [facility] for tasks assessed/performed             Past Medical History:  Diagnosis Date   Adenomatous colon polyp    2010    Anemia    Anxiety    Arthritis    "hips and lower back"   Arthritis pain    Atypical mole 06/21/2003   left neck, inferior - slight to moderate atypia   Basal cell carcinoma 10/04/2014   right shin bcc (tx cx3, 34fu )   Blood transfusion    Chest pain 03/29/2011   2D Echo without contrast on 03/29/11 - EF= 55-60%.   Cholecystitis    Chronic back pain greater than 3 months duration    Chronic kidney disease    Colon polyps    Complication of anesthesia    once slow to wake up   Coronary angioplasty status 2011   Coronary artery disease    Diarrhea    Diverticulosis    Dyslipidemia    GERD (gastroesophageal reflux disease)    GI bleeding after 07/2009   "triggered by Plavix & ASA; from my diverticulosis"   H/O Clostridium difficile infection    H/O myocardial perfusion scan 04/04/2011   For Pre-noncardiac surgery - EF = 67%, no ischemia, and is considered low risk.   Heart attack ([place]) 07/2009   nonSTEMI with an occluded circumflex artery and collaterals.   Heart murmur    Hemorrhoids    History of shingles     Hyperlipidemia    Hypertension    Kidney stones    Lower extremity edema    Taking furosemide and it seems to be helping.   Neuromuscular disorder ([place])    Nodular infiltrative basal cell carcinoma (BCC) 02/08/2021   Right Forearm-anterior (tx p bx)   Nonspecific chest pain 08/26/2012   Went to ED 08/26/12   Parkinsonism ([place])    SCCA (squamous cell carcinoma) of skin 02/08/2021   Right Dorsal Hand (well diff) (tx p bx)   Squamous cell carcinoma of skin 05/13/2000   Right outer forehead   Squamous cell carcinoma of skin 03/02/2001   Post crown - tx 03/19/2001   Squamous cell carcinoma of skin 09/01/2001   Left post auricular - tx 10/09/2001   Squamous cell carcinoma of skin 06/21/2003   Top right ear - tx 07/05/2003   Squamous cell carcinoma of skin 06/21/2003   Right upper arm - tx 07/05/2003   Squamous cell carcinoma of skin 10/12/2003   Left superior ear lobe - MOHs   Squamous cell carcinoma of skin 12/15/2006   Right ear, superior - tx 01/26/2007   Squamous cell carcinoma of skin 03/13/2009   Right temple -  tx 05/08/2009   Squamous cell carcinoma of skin 03/24/2013   Upper left forearm - tx p bx   Squamous cell carcinoma of skin 12/06/2013   Right forearm - CX3 + 5FU   Squamous cell carcinoma of skin 02/10/2014   Right hand - tx p bx   Squamous cell carcinoma of skin 10/04/2014   Right shin - tx p bx   Squamous cell carcinoma of skin 08/21/2016   Left hand - tx p bx   Squamous cell carcinoma of skin 01/01/2017   Right hand - tx 02/06/2017   Squamous cell carcinoma of skin 10/21/2017   Left side of scalp - tx 03/12/2018   Squamous cell carcinoma of skin 10/21/2017   Left post scalp, inf - tx 08/13/2018   Squamous cell carcinoma of skin 10/21/2017   Right scalp - tx p bx   Squamous cell carcinoma of skin 10/21/2017   Left cheek - tx 03/12/2018   Squamous cell carcinoma of skin 08/13/2018   Left top hand, proximal pinky - tx p bx   Squamous cell carcinoma of skin  12/23/2018   Right bicep lower - tx p bx   Squamous cell carcinoma of skin 05/18/2019   Right 4th finger metacarpophalangeal joint   Squamous cell carcinoma of skin 05/18/2019   Left 5th finger metacarpophalangeal joint   Squamous cell carcinoma of skin 05/18/2019   Left 2nd finger metacarpophalangeal joint   Stomach problems    Syncope and collapse 04/16/2018    Past Surgical History:  Procedure Laterality Date   [place]  08/09/2009   Circumflex 100% occluded with right-to-left collaterals, mild irregularities in the proximal and distal LAD and diagonal system, normal LV systolic function, and minor infrarenal irregularities, but no abdominal aortic aneurysm.   CHOLECYSTECTOMY  04/12/2011   Procedure: CHOLECYSTECTOMY;  Surgeon: Adin Hector, MD;  Location: [facility];  Service: General;  Laterality: N/A;   CHOLECYSTECTOMY  04/12/2011   Procedure: LAPAROSCOPIC CHOLECYSTECTOMY;  Surgeon: Adin Hector, MD;  Location: [facility];  Service: General;  Laterality: N/A;  converted to open   COLON SURGERY     COLONOSCOPY   ESOPHAGOGASTRODUODENOSCOPY N/A 08/05/2013   Procedure: ESOPHAGOGASTRODUODENOSCOPY (EGD);  Surgeon: Jerene Bears, MD;  Location: [facility];  Service: Gastroenterology;  Laterality: N/A;   EYE SURGERY  1942   "right eye was crossed; they  corrected it"   LUMBAR FUSION  07/22/2012   PACEMAKER IMPLANT N/A 04/17/2018   Procedure: PACEMAKER IMPLANT;  Surgeon: Deboraha Sprang, MD;  Location: [facility];  Service: Cardiovascular;  Laterality: N/A;   POSTERIOR FUSION LUMBAR SPINE  08/1995   "bottom 4 vertebra"   SUBTOTAL COLECTOMY  01/24/2010   TOTAL HIP ARTHROPLASTY Right 08/03/2013   Procedure: TOTAL HIP ARTHROPLASTY ANTERIOR APPROACH;  Surgeon: Hessie Dibble, MD;  Location: [facility];  Service: Orthopedics;  Laterality: Right;    Vitals:   03/01/21 0938  Pulse: 70      Subjective Assessment - 03/01/21 0938     Subjective Pt reports  history of multiple lumbar surgeries with fusions starting back in 1997. Also has history of right hip replacement. Reports needs left hip done but had terrible case of C-diff after right hip surgery that was very debilitating about 5-10 years ago. He reports that he is now also having pain in right hip and back which has been limiting him. He feels that his balance has not been great either.  He has not been as mobile due to being home due to the pandemic. Pt walks with [facility] when goes out as they have trouble getting walker in car. Has 4 wheeled walker he uses around the house more. Also has a FWW but has not really been using.  Pt reports that he only sees out of left eye since childhood.    Patient is accompained by: Family member   wife, Lelon Frohlich   Pertinent History anxiety, arthritis, basal cell carcinoma, back pain, chronic kidney disease, CAD, MI, heart murmur, HTN, lumbar fusion, pacemaker, right THR, mild memory loss    Patient Stated Goals Pt would like to decrease his back and hip pain as well as improve his balance and walking.    Currently in Pain? Yes    Pain Score 0-No pain   no pain at rest, 8/10 at worst   Pain Location Back    Pain Onset More than a month ago    Pain Frequency Intermittent    Aggravating Factors  worst at night, sleeps on left side. Also reports walking 20 minutes increases back pain    Pain Relieving Factors tylenol    Multiple Pain Sites Yes    Pain Score 0   no pain at rest, increases to 9/10   Pain Location Hip    Pain Orientation Right    Pain Descriptors / Indicators Aching    Pain Type Chronic pain    Pain Onset More than a month ago    Pain Frequency Intermittent    Aggravating Factors  worse at night as well- sometimes wakes him up, longer walks    Pain Relieving Factors moving                [facility] PT Assessment - 03/01/21 0953       Assessment   Medical Diagnosis abnormality of gait and mobilty    Referring Provider (PT) Andrey Spearman     Onset Date/Surgical Date 02/27/21    Hand Dominance Right    Prior Therapy outpatient PT in the past      Precautions   Precautions Fall;ICD/Pacemaker    Precaution Comments pacemaker      Balance Screen   Has the patient fallen in the past 6 months No    Has the patient had a decrease in activity level because of a fear of falling?  Yes    Is the patient reluctant to leave their home because of a fear of falling?  Yes      [place] Private residence    Living Arrangements Spouse/significant other;Children   daughter also lives with them who is on disability   Available Help at Discharge Family    Type of [place] to enter    Entrance Stairs-Number of Steps 1    [place] One level    [place] - 2 wheels;Walker - 4 wheels;Cane - single point    Additional Comments having walk in shower put in next month      Prior Function   Level of Independence Independent with basic ADLs;Independent with household mobility with device   wife now nearby to assist as needed   Vocation Retired    Leisure music, Engineer, site, reads      Cognition   Overall Cognitive Status Impaired/Different from baseline    Area of Impairment Memory   mild memory loss per  neurologist     Observation/Other Assessments-Edema    Edema --   mild pitting edema bilateral LE     Sensation   Light Touch Appears Intact      ROM / Strength   AROM / PROM / Strength Strength;AROM      AROM   Overall AROM Comments shoulder flexion overhead with no pain      Strength   Overall Strength Comments 4/5 grossly    Strength Assessment Site Hip;Knee;Ankle    Right/Left Hip Right;Left    Right Hip Flexion 4/5    Right Hip ABduction 4/5   seated   Left Hip Flexion 4/5    Left Hip ABduction 4/5   seated   Right/Left Knee Right;Left    Right Knee Flexion 4/5    Right Knee Extension 4/5    Left Knee Flexion 4/5    Left  Knee Extension 4/5    Right/Left Ankle Right;Left    Right Ankle Dorsiflexion 4/5    Left Ankle Dorsiflexion 4/5      Transfers   Transfers Sit to Stand;Stand to Sit    Sit to Stand 5: Supervision;With upper extremity assist    Five time sit to stand comments  38.01 from chair with hands    Stand to Sit 5: Supervision      Ambulation/Gait   Ambulation/Gait Yes    Ambulation/Gait Assistance 4: Min guard    Ambulation/Gait Assistance Details trunk shifted to left    Ambulation Distance (Feet) 75 Feet    Assistive device Straight cane    Gait Pattern Step-to pattern;Decreased step length - right;Decreased step length - left;Decreased stance time - right;Trunk flexed    Ambulation Surface Level;Indoor    Gait velocity 31.37 sec=0.62m/s      Standardized Balance Assessment   Standardized Balance Assessment Timed Up and Go Test      Timed Up and Go Test   TUG Normal TUG    Normal TUG (seconds) 38.78   with SPC                       Objective measurements completed on examination: See above findings.                PT Education - 03/01/21 2031     Education Details PT plan of care    Person(s) Educated Patient;Spouse    Methods Explanation    Comprehension Verbalized understanding              PT Short Term Goals - 03/01/21 2051       PT SHORT TERM GOAL #1   Title Pt will be able to perform HEP with supervision of wife for strengthening and balance to continue gains on own.    Time 4    Period Weeks    Status New    Target Date 03/29/21      PT SHORT TERM GOAL #2   Title Pt will decrease 5 x sit to stand from 38.01 sec to <33 sec for improved balance and functional strength.    Baseline 03/01/21 38.01 sec from chair with hands    Time 4    Period Weeks    Status New    Target Date 03/29/21      PT SHORT TERM GOAL #3   Title Pt will ambulate >250' with LRAD on level surfaces mod I for improved mobility in home.    Time 4    Period  Weeks    Status New    Target Date 03/29/21      PT SHORT TERM GOAL #4   Title [place] TBD and LTG updated.               PT Long Term Goals - 03/01/21 2053       PT LONG TERM GOAL #1   Title Pt will be report decrease in low back/right hip pain to 6/10 at worst with activities.    Baseline 8-9/10 at worst currently, 0/10 at rest    Time 8    Period Weeks    Status New    Target Date 04/27/21      PT LONG TERM GOAL #2   Title Pt will decrease TUG from 38.78 sec to <32 sec for improved balance and functional mobility.    Baseline 03/01/21 38.78 sec    Time 8    Period Weeks    Status New    Target Date 04/27/21      PT LONG TERM GOAL #3   Title Pt will ambulate >500' on varied level surfaces with LRAD mod I for improved short community distances.    Time 8    Period Weeks    Status New    Target Date 04/27/21      PT LONG TERM GOAL #4   Title Pt will ambulate up/down 1 step with cane to safely enter/exit home.    Time 8    Period Weeks    Status New    Target Date 04/27/21      PT LONG TERM GOAL #5   Title Pt iwll increase gait speed to >0.63m/s for improved household mobility.    Baseline 03/01/21 0.23m/s    Time 8    Period Weeks    Status New    Target Date 04/27/21      Additional Long Term Goals   Additional Long Term Goals Yes      PT LONG TERM GOAL #6   Title [place] TBD    Time 8    Period Weeks    Status New    Target Date 04/27/21                    Plan - 03/01/21 2032     Clinical Impression Statement Pt is 88 y/o male referred for abnormality of gait and mobility. He is history of multiple lumbar surgeries with fusion as well as right THR. Pt has pacemaker. He reports left hip was never able to be replaced and has bothered him but now pain is worse in right hip/low back. No pain at rest but reports he does get pain up to 8 to 9/10 at times with longer walks or at night. Pt walking with SPC at visit today and is unsteady at times with  left trunk lean and decreased step length. Ambulates at slow gait speed of 0.26m/s indicating decrease safety with household mobility. He also fatigues quickly. Pt is fall risk based on 5 x sit to stand of 38.01 sec and TUG of 38.78 sec. Pt will benefit from skilled PT to address strengthe, balance and functional mobility deficits.    Personal Factors and Comorbidities Comorbidity 3+;Age    Comorbidities anxiety, arthritis, basal cell carcinoma, back pain, chronic kidney disease, CAD, MI, heart murmur, HTN, lumbar fusion, pacemaker, right THR, mild memory loss    Examination-Activity Limitations Locomotion Level;Transfers;Stand;Stairs;Squat    Examination-Participation Restrictions Cleaning;Meal Prep;Community Activity  Stability/Clinical Decision Making Evolving/Moderate complexity    Clinical Decision Making Moderate    Rehab Potential Good    PT Frequency 2x / week   plus eval   PT Duration 8 weeks    PT Treatment/Interventions ADLs/Self Care Home Management;DME Instruction;Cryotherapy;Moist Heat;Gait training;Stair training;Functional mobility training;Therapeutic activities;Therapeutic exercise;Balance training;Neuromuscular re-education;Manual techniques;Passive range of motion;Vestibular;Patient/family education    PT Next Visit Plan Show pt and wife how to fold up rollator so they can get in car. Assess [place] Balance and update LTG. Begin functional strengthening activities like sit to stand, standing balance.             Patient will benefit from skilled therapeutic intervention in order to improve the following deficits and impairments:  Abnormal gait, Cardiopulmonary status limiting activity, Decreased activity tolerance, Decreased balance, Decreased cognition, Decreased mobility, Decreased strength, Decreased knowledge of use of DME, Decreased endurance, Pain  Visit Diagnosis: Other abnormalities of gait and mobility  Muscle weakness (generalized)  Unsteadiness on  feet  Chronic right-sided low back pain, unspecified whether sciatica present     Problem List Patient Active Problem List   Diagnosis Date Noted   Vitamin B12 deficiency 04/15/2019   Abnormal glucose 04/15/2019   Stage 4 chronic kidney disease ([place]) 04/15/2019   Dyspnea on exertion 04/15/2019   Pulmonary hypertension ([place]) 04/08/2019   Chest pain 03/27/2019   Complete heart block (HCC) 04/17/2018   Syncope and collapse 04/16/2018   Sinus pause 04/16/2018   Snoring 10/03/2017   Other fatigue 10/03/2017   Pre-syncope 05/14/2016   Murmur 05/14/2016   CAD in native artery 10/31/2015   History of MI (myocardial infarction) 10/31/2015   Preoperative cardiovascular examination 12/26/2014   History of colonic polyps 01/17/2014   PAF (paroxysmal atrial fibrillation) ([place]) 34/91/7915   Metabolic acidosis 05/69/7948   Acute renal failure ([place]) 08/13/2013   Stage 3b chronic kidney disease ([place]) 08/13/2013   Anemia in chronic kidney disease 08/13/2013   Hypokalemia 08/12/2013   Acute blood loss anemia 08/09/2013   Encephalopathy 08/09/2013   Severe sepsis ([place]) 08/08/2013   Hypotension, unspecified 08/07/2013   Complex renal cyst 08/07/2013   Renal mass, left 08/06/2013   Upper GI bleeding 08/05/2013   Acute esophagitis 08/05/2013   Urinary retention 08/04/2013   S/P total hip arthroplasty 08/03/2013   Degenerative joint disease (DJD) of hip 08/03/2013    Class: Chronic   Internal hemorrhoids with other complication 01/65/5374   Tinnitus 10/22/2012   C. difficile colitis 07/26/2012   Ileus, postoperative ([place]) 07/24/2012   Somnolence 07/24/2012   Anxiety state 07/24/2012   Chest pain- ER 08/26/12 03/30/2011   Fever 03/28/2011   Leucocytosis 03/28/2011   CKD (chronic kidney disease), stage III ([place]) 03/28/2011   Hyponatremia 02/02/2011   BPH (benign prostatic hyperplasia) 02/01/2011   Cholecystitis chronic, acute 01/26/2011   Dyslipidemia 01/22/2011   Abdominal bloating  08/07/2010   Loose stools 08/07/2010   Coronary atherosclerosis 10/26/2009   Essential hypertension 06/07/2009   DIVERTICULOSIS OF COLON 10/31/2008   DIVERTICULOSIS, COLON, WITH HEMORRHAGE, surg 12/11  10/31/2008   Personal history of colonic polyps 10/31/2008    Electa Sniff, PT, DPT, NCS 03/01/2021, 9:02 PM  [facility] [address] [place], [place], [address] Phone: [phone]   Fax:  [phone]  Name: APRIL CARLYON MRN: [id number] Date of Birth: [birth year]

## 2021-03-11 NOTE — Progress Notes (Signed)
Follow-Up Visit   Subjective  Gary [name] is a 88 y.o. male who presents for the following: Skin Problem (Pt here for some spot on the face and ears he would like looked at. Pt states that the spots bleed at times. Pt states that [facility] and the spot they worked on has not fully healed ).  Multiple tender crusts, new onset of extremely painful ingrown nail on the Location:  Duration:  Quality:  Associated Signs/Symptoms: Modifying Factors:  Severity:  Timing: Context:   Objective  Well appearing patient in no apparent distress; mood and affect are within normal limits. Right Anterior Helix 3 mm hornlike crust on pink waxy base       Right Thumb Nail Plate Distal horizontal nail piercing into distal lateral nail fold with marked tenderness and pain and swelling  Left Dorsal Hand, Mid Occipital Scalp, Right Dorsal Mid 3rd Finger, Right Proximal 4th Finger Multiple gritty plus hornlike crusts   Right Dorsal Hand 8 mm waxy pink crust         Right Forearm - Anterior 1.1 cm pearly pink crust         A focused examination was performed including head, neck, arms, hands. Relevant physical exam findings are noted in the Assessment and Plan.   Assessment & Plan    Neoplasm of uncertain behavior of skin Right Anterior Helix  Skin / nail biopsy Type of biopsy: tangential   Informed consent: discussed and consent obtained   Timeout: patient name, date of birth, surgical site, and procedure verified   Anesthesia: the lesion was anesthetized in a standard fashion   Anesthetic:  1% lidocaine w/ epinephrine 1-100,000 local infiltration Instrument used: flexible razor blade   Hemostasis achieved with: aluminum chloride and electrodesiccation   Outcome: patient tolerated procedure well   Post-procedure details: wound care instructions given    Destruction of lesion Complexity: simple   Destruction method:  electrodesiccation and curettage   Informed consent: discussed and consent obtained   Timeout:  patient name, date of birth, surgical site, and procedure verified Anesthesia: the lesion was anesthetized in a standard fashion   Anesthetic:  1% lidocaine w/ epinephrine 1-100,000 local infiltration Curettage cycles:  3 Final wound size (cm):  0.4 Hemostasis achieved with:  ferric subsulfate Outcome: patient tolerated procedure well with no complications   Post-procedure details: sterile dressing applied and wound care instructions given   Dressing type: bandage and petrolatum   Additional details:  TXPBX  Specimen 3 - Surgical pathology Differential Diagnosis: R/O BCC VS SCC  Check Margins: No  After shave biopsy the base was treated with curettage plus cautery  Ingrown fingernail Right Thumb Nail Plate  Scissor excision of ingrown fingernail with I&D small abscess; immediate pain relief.  Actinic keratosis (4) Left Dorsal Hand; Right Proximal 4th Finger; Right Dorsal Mid 3rd Finger; Mid Occipital Scalp  Destruction of lesion - Left Dorsal Hand, Mid Occipital Scalp, Right Dorsal Mid 3rd Finger, Right Proximal 4th Finger Complexity: simple   Destruction method: cryotherapy   Informed consent: discussed and consent obtained   Timeout:  patient name, date of birth, surgical site, and procedure verified Lesion destroyed using liquid nitrogen: Yes   Cryotherapy cycles:  3 Outcome: patient tolerated procedure well with no complications   Post-procedure details: wound care instructions given    Squamous cell carcinoma in situ (SCCIS) of dorsum of right hand Right Dorsal Hand  Skin / nail biopsy  Type of biopsy: tangential   Informed consent: discussed and consent obtained   Timeout: patient name, date of birth, surgical site, and procedure verified   Anesthesia: the lesion was anesthetized in a standard fashion   Anesthetic:  1% lidocaine w/ epinephrine 1-100,000 local  infiltration Instrument used: flexible razor blade   Hemostasis achieved with: aluminum chloride and electrodesiccation   Outcome: patient tolerated procedure well   Post-procedure details: wound care instructions given    Destruction of lesion Complexity: simple   Destruction method: electrodesiccation and curettage   Informed consent: discussed and consent obtained   Timeout:  patient name, date of birth, surgical site, and procedure verified Anesthesia: the lesion was anesthetized in a standard fashion   Anesthetic:  1% lidocaine w/ epinephrine 1-100,000 local infiltration Curettage cycles:  3 Lesion length (cm):  1.1 Lesion width (cm):  1.1 Margin per side (cm):  0 Final wound size (cm):  1.1 Hemostasis achieved with:  ferric subsulfate Outcome: patient tolerated procedure well with no complications   Post-procedure details: sterile dressing applied and wound care instructions given   Dressing type: bandage and petrolatum   Additional details:  TXPBX  Specimen 1 - Surgical pathology Differential Diagnosis: R/O BCC VS SCC  Check Margins: No  Basal cell carcinoma, arm, right Right Forearm - Anterior  Skin / nail biopsy Type of biopsy: tangential   Informed consent: discussed and consent obtained   Timeout: patient name, date of birth, surgical site, and procedure verified   Anesthesia: the lesion was anesthetized in a standard fashion   Anesthetic:  1% lidocaine w/ epinephrine 1-100,000 local infiltration Instrument used: flexible razor blade   Hemostasis achieved with: aluminum chloride and electrodesiccation   Outcome: patient tolerated procedure well   Post-procedure details: wound care instructions given    Destruction of lesion Complexity: simple   Destruction method: electrodesiccation and curettage   Informed consent: discussed and consent obtained   Timeout:  patient name, date of birth, surgical site, and procedure verified Anesthesia: the lesion was  anesthetized in a standard fashion   Anesthetic:  1% lidocaine w/ epinephrine 1-100,000 local infiltration Curettage cycles:  3 Lesion length (cm):  1.3 Lesion width (cm):  1.3 Margin per side (cm):  0 Final wound size (cm):  1.3 Hemostasis achieved with:  ferric subsulfate Outcome: patient tolerated procedure well with no complications   Post-procedure details: sterile dressing applied and wound care instructions given   Dressing type: bandage and petrolatum   Additional details:  TXPBX  Specimen 2 - Surgical pathology Differential Diagnosis: R/O BCC VS SCC  Check Margins: No      I, Lavonna Monarch, MD, have reviewed all documentation for this visit.  The documentation on 03/11/21 for the exam, diagnosis, procedures, and orders are all accurate and complete.

## 2021-03-13 ENCOUNTER — Ambulatory Visit: Payer: Medicare PPO | Admitting: Physical Therapy

## 2021-03-13 ENCOUNTER — Other Ambulatory Visit: Payer: Self-pay

## 2021-03-13 ENCOUNTER — Encounter: Payer: Self-pay | Admitting: Physical Therapy

## 2021-03-13 DIAGNOSIS — M6281 Muscle weakness (generalized): Secondary | ICD-10-CM

## 2021-03-13 DIAGNOSIS — R2681 Unsteadiness on feet: Secondary | ICD-10-CM

## 2021-03-13 DIAGNOSIS — R2689 Other abnormalities of gait and mobility: Secondary | ICD-10-CM | POA: Diagnosis not present

## 2021-03-13 NOTE — Therapy (Addendum)
[facility] [address] [place], [place], [address] Phone: [phone]   Fax:  [phone]  Physical Therapy Treatment  Patient Details  Name: Gary [name] MRN: [id number] Date of Birth: 27-Oct-1932 Referring Provider (PT): Andrey Spearman   Encounter Date: 03/13/2021   PT End of Session - 03/13/21 1325     Visit Number 2    Number of Visits 17    Date for PT Re-Evaluation 04/27/21    Authorization Type [employer] so 10th visit progress note    Authorization Time Period 17 visits 03/01/21 through 05/03/21    Authorization - Visit Number 2    Authorization - Number of Visits 17    Progress Note Due on Visit 10    PT Start Time 1319    PT Stop Time 1400    PT Time Calculation (min) 41 min    Equipment Utilized During Treatment Gait belt    Activity Tolerance Patient tolerated treatment well    Behavior During Therapy WFL for tasks assessed/performed             Past Medical History:  Diagnosis Date   Adenomatous colon polyp    2010    Anemia    Anxiety    Arthritis    "hips and lower back"   Arthritis pain    Atypical mole 06/21/2003   left neck, inferior - slight to moderate atypia   Basal cell carcinoma 10/04/2014   right shin bcc (tx cx3, 37fu )   Blood transfusion    Chest pain 03/29/2011   2D Echo without contrast on 03/29/11 - EF= 55-60%.   Cholecystitis    Chronic back pain greater than 3 months duration    Chronic kidney disease    Colon polyps    Complication of anesthesia    once slow to wake up   Coronary angioplasty status 2011   Coronary artery disease    Diarrhea    Diverticulosis    Dyslipidemia    GERD (gastroesophageal reflux disease)    GI bleeding after 07/2009   "triggered by Plavix & ASA; from my diverticulosis"   H/O Clostridium difficile infection    H/O myocardial perfusion scan 04/04/2011   For Pre-noncardiac surgery - EF = 67%, no ischemia, and is considered low  risk.   Heart attack ([place]) 07/2009   nonSTEMI with an occluded circumflex artery and collaterals.   Heart murmur    Hemorrhoids    History of shingles    Hyperlipidemia    Hypertension    Kidney stones    Lower extremity edema    Taking furosemide and it seems to be helping.   Neuromuscular disorder ([place])    Nodular infiltrative basal cell carcinoma (BCC) 02/08/2021   Right Forearm-anterior (tx p bx)   Nonspecific chest pain 08/26/2012   Went to ED 08/26/12   Parkinsonism ([place])    SCCA (squamous cell carcinoma) of skin 02/08/2021   Right Dorsal Hand (well diff) (tx p bx)   Squamous cell carcinoma of skin 05/13/2000   Right outer forehead   Squamous cell carcinoma of skin 03/02/2001   Post crown - tx 03/19/2001   Squamous cell carcinoma of skin 09/01/2001   Left post auricular - tx 10/09/2001   Squamous cell carcinoma of skin 06/21/2003   Top right ear - tx 07/05/2003   Squamous cell carcinoma of skin 06/21/2003   Right upper arm - tx 07/05/2003   Squamous cell carcinoma of skin  10/12/2003   Left superior ear lobe - MOHs   Squamous cell carcinoma of skin 12/15/2006   Right ear, superior - tx 01/26/2007   Squamous cell carcinoma of skin 03/13/2009   Right temple - tx 05/08/2009   Squamous cell carcinoma of skin 03/24/2013   Upper left forearm - tx p bx   Squamous cell carcinoma of skin 12/06/2013   Right forearm - CX3 + 5FU   Squamous cell carcinoma of skin 02/10/2014   Right hand - tx p bx   Squamous cell carcinoma of skin 10/04/2014   Right shin - tx p bx   Squamous cell carcinoma of skin 08/21/2016   Left hand - tx p bx   Squamous cell carcinoma of skin 01/01/2017   Right hand - tx 02/06/2017   Squamous cell carcinoma of skin 10/21/2017   Left side of scalp - tx 03/12/2018   Squamous cell carcinoma of skin 10/21/2017   Left post scalp, inf - tx 08/13/2018   Squamous cell carcinoma of skin 10/21/2017   Right scalp - tx p bx   Squamous cell carcinoma of skin  10/21/2017   Left cheek - tx 03/12/2018   Squamous cell carcinoma of skin 08/13/2018   Left top hand, proximal pinky - tx p bx   Squamous cell carcinoma of skin 12/23/2018   Right bicep lower - tx p bx   Squamous cell carcinoma of skin 05/18/2019   Right 4th finger metacarpophalangeal joint   Squamous cell carcinoma of skin 05/18/2019   Left 5th finger metacarpophalangeal joint   Squamous cell carcinoma of skin 05/18/2019   Left 2nd finger metacarpophalangeal joint   Stomach problems    Syncope and collapse 04/16/2018    Past Surgical History:  Procedure Laterality Date   [place]  08/09/2009   Circumflex 100% occluded with right-to-left collaterals, mild irregularities in the proximal and distal LAD and diagonal system, normal LV systolic function, and minor infrarenal irregularities, but no abdominal aortic aneurysm.   CHOLECYSTECTOMY  04/12/2011   Procedure: CHOLECYSTECTOMY;  Surgeon: Adin Hector, MD;  Location: [facility];  Service: General;  Laterality: N/A;   CHOLECYSTECTOMY  04/12/2011   Procedure: LAPAROSCOPIC CHOLECYSTECTOMY;  Surgeon: Adin Hector, MD;  Location: [facility];  Service: General;  Laterality: N/A;  converted to open   COLON SURGERY     COLONOSCOPY   ESOPHAGOGASTRODUODENOSCOPY N/A 08/05/2013   Procedure: ESOPHAGOGASTRODUODENOSCOPY (EGD);  Surgeon: Jerene Bears, MD;  Location: [facility];  Service: Gastroenterology;  Laterality: N/A;   EYE SURGERY  1942   "right eye was crossed; they  corrected it"   LUMBAR FUSION  07/22/2012   PACEMAKER IMPLANT N/A 04/17/2018   Procedure: PACEMAKER IMPLANT;  Surgeon: Deboraha Sprang, MD;  Location: [facility];  Service: Cardiovascular;  Laterality: N/A;   POSTERIOR FUSION LUMBAR SPINE  08/1995   "bottom 4 vertebra"   SUBTOTAL COLECTOMY  01/24/2010   TOTAL HIP ARTHROPLASTY Right 08/03/2013   Procedure: TOTAL HIP ARTHROPLASTY ANTERIOR APPROACH;  Surgeon: Hessie Dibble, MD;  Location: [facility];  Service: Orthopedics;  Laterality: Right;    There were no vitals filed for this visit.   Subjective Assessment - 03/13/21 1320     Subjective No new complaints. No falls. "Same ol" pain. Using RW to clinic today.    Patient is accompained by: Family member   spouse, Gary [name], in lobby   Pertinent History anxiety, arthritis, basal cell carcinoma,  back pain, chronic kidney disease, CAD, MI, heart murmur, HTN, lumbar fusion, pacemaker, right THR, mild memory loss    Patient Stated Goals Pt would like to decrease his back and hip pain as well as improve his balance and walking.    Currently in Pain? Yes    Pain Score 0-No pain   none at rest, 8/10 with movements at times   Pain Location Generalized    Pain Type Chronic pain    Pain Onset More than a month ago    Pain Frequency Intermittent    Aggravating Factors  certain movements    Pain Relieving Factors rest, tylenol                [facility] PT Assessment - 03/13/21 1329       Standardized Balance Assessment   Standardized Balance Assessment Berg Balance Test      Berg Balance Test   Sit to Stand Able to stand without using hands and stabilize independently    Standing Unsupported Able to stand 2 minutes with supervision    Sitting with Back Unsupported but Feet Supported on Floor or Stool Able to sit safely and securely 2 minutes    Stand to Sit Controls descent by using hands    Transfers Able to transfer safely, minor use of hands    Standing Unsupported with Eyes Closed Able to stand 10 seconds with supervision    Standing Unsupported with Feet Together Able to place feet together independently and stand for 1 minute with supervision    From Standing, Reach Forward with Outstretched Arm Can reach forward >5 cm safely (2")   4 inches   From Standing Position, Pick up Object from Floor Able to pick up shoe, needs supervision    From Standing Position, Turn to Look Behind Over each Shoulder Turn sideways only but maintains  balance    Turn 360 Degrees Needs close supervision or verbal cueing    Standing Unsupported, Alternately Place Feet on Step/Stool Able to complete >2 steps/needs minimal assist    Standing Unsupported, One Foot in Front Able to take small step independently and hold 30 seconds    Standing on One Leg Tries to lift leg/unable to hold 3 seconds but remains standing independently    Total Score 36                   [facility] Adult PT Treatment/Exercise - 03/13/21 1329       Transfers   Transfers Sit to Stand;Stand to Sit    Sit to Stand 5: Supervision;With upper extremity assist    Stand to Sit 5: Supervision      Ambulation/Gait   Ambulation/Gait Yes    Ambulation/Gait Assistance 4: Min guard    Ambulation/Gait Assistance Details cues on posture and walker position with gait    Ambulation Distance (Feet) 100 Feet   x2   Assistive device Rolling walker    Gait Pattern Step-through pattern;Decreased stride length;Shuffle;Trunk flexed    Ambulation Surface Level;Indoor                       PT Short Term Goals - 03/01/21 2051       PT SHORT TERM GOAL #1   Title Pt will be able to perform HEP with supervision of wife for strengthening and balance to continue gains on own.    Time 4    Period Weeks    Status New    Target  Date 03/29/21      PT SHORT TERM GOAL #2   Title Pt will decrease 5 x sit to stand from 38.01 sec to <33 sec for improved balance and functional strength.    Baseline 03/01/21 38.01 sec from chair with hands    Time 4    Period Weeks    Status New    Target Date 03/29/21      PT SHORT TERM GOAL #3   Title Pt will ambulate >250' with LRAD on level surfaces mod I for improved mobility in home.    Time 4    Period Weeks    Status New    Target Date 03/29/21      PT SHORT TERM GOAL #4   Title Berg TBD and LTG updated.               PT Long Term Goals - 03/01/21 2053       PT LONG TERM GOAL #1   Title Pt will be report decrease  in low back/right hip pain to 6/10 at worst with activities.    Baseline 8-9/10 at worst currently, 0/10 at rest    Time 8    Period Weeks    Status New    Target Date 04/27/21      PT LONG TERM GOAL #2   Title Pt will decrease TUG from 38.78 sec to <32 sec for improved balance and functional mobility.    Baseline 03/01/21 38.78 sec    Time 8    Period Weeks    Status New    Target Date 04/27/21      PT LONG TERM GOAL #3   Title Pt will ambulate >500' on varied level surfaces with LRAD mod I for improved short community distances.    Time 8    Period Weeks    Status New    Target Date 04/27/21      PT LONG TERM GOAL #4   Title Pt will ambulate up/down 1 step with cane to safely enter/exit home.    Time 8    Period Weeks    Status New    Target Date 04/27/21      PT LONG TERM GOAL #5   Title Pt iwll increase gait speed to >0.24m/s for improved household mobility.    Baseline 03/01/21 0.17m/s    Time 8    Period Weeks    Status New    Target Date 04/27/21      Additional Long Term Goals   Additional Long Term Goals Yes      PT LONG TERM GOAL #6   Title Berg TBD    Time 8    Period Weeks    Status New    Target Date 04/27/21            PT updated Merrilee Jansky goal for addendum to flow to next visit.       Plan - 03/13/21 1325     Clinical Impression Statement Majority of today's skilled session focused on establishment of baseline score for Berg Balance test with pt scoring 36-56. Primary PT to update goals. Remainder of session addressed gait training with RW with cues for general safety and posture needed. The pt is making steady progress toward goals and should benefit from continued PT to progress toward unmet goals.    Personal Factors and Comorbidities Comorbidity 3+;Age    Comorbidities anxiety, arthritis, basal cell carcinoma, back pain, chronic kidney disease, CAD, MI, heart  murmur, HTN, lumbar fusion, pacemaker, right THR, mild memory loss     Examination-Activity Limitations Locomotion Level;Transfers;Stand;Stairs;Squat    Examination-Participation Restrictions Cleaning;Meal Prep;Community Activity    Stability/Clinical Decision Making Evolving/Moderate complexity    Rehab Potential Good    PT Frequency 2x / week   plus eval   PT Duration 8 weeks    PT Treatment/Interventions ADLs/Self Care Home Management;DME Instruction;Cryotherapy;Moist Heat;Gait training;Stair training;Functional mobility training;Therapeutic activities;Therapeutic exercise;Balance training;Neuromuscular re-education;Manual techniques;Passive range of motion;Vestibular;Patient/family education    PT Next Visit Plan Show pt and wife how to fold up rollator so they can get in car. issue HEP to include functional strengthening activities like sit to stand, standing balance.             Patient will benefit from skilled therapeutic intervention in order to improve the following deficits and impairments:  Abnormal gait, Cardiopulmonary status limiting activity, Decreased activity tolerance, Decreased balance, Decreased cognition, Decreased mobility, Decreased strength, Decreased knowledge of use of DME, Decreased endurance, Pain  Visit Diagnosis: Other abnormalities of gait and mobility  Muscle weakness (generalized)  Unsteadiness on feet     Problem List Patient Active Problem List   Diagnosis Date Noted   Vitamin B12 deficiency 04/15/2019   Abnormal glucose 04/15/2019   Stage 4 chronic kidney disease ([place]) 04/15/2019   Dyspnea on exertion 04/15/2019   Pulmonary hypertension ([place]) 04/08/2019   Chest pain 03/27/2019   Complete heart block (HCC) 04/17/2018   Syncope and collapse 04/16/2018   Sinus pause 04/16/2018   Snoring 10/03/2017   Other fatigue 10/03/2017   Pre-syncope 05/14/2016   Murmur 05/14/2016   CAD in native artery 10/31/2015   History of MI (myocardial infarction) 10/31/2015   Preoperative cardiovascular examination 12/26/2014    History of colonic polyps 01/17/2014   PAF (paroxysmal atrial fibrillation) (HCC) 11/91/4782   Metabolic acidosis 95/62/1308   Acute renal failure ([place]) 08/13/2013   Stage 3b chronic kidney disease ([place]) 08/13/2013   Anemia in chronic kidney disease 08/13/2013   Hypokalemia 08/12/2013   Acute blood loss anemia 08/09/2013   Encephalopathy 08/09/2013   Severe sepsis (HCC) 08/08/2013   Hypotension, unspecified 08/07/2013   Complex renal cyst 08/07/2013   Renal mass, left 08/06/2013   Upper GI bleeding 08/05/2013   Acute esophagitis 08/05/2013   Urinary retention 08/04/2013   S/P total hip arthroplasty 08/03/2013   Degenerative joint disease (DJD) of hip 08/03/2013    Class: Chronic   Internal hemorrhoids with other complication 65/78/4696   Tinnitus 10/22/2012   C. difficile colitis 07/26/2012   Ileus, postoperative ([place]) 07/24/2012   Somnolence 07/24/2012   Anxiety state 07/24/2012   Chest pain- ER 08/26/12 03/30/2011   Fever 03/28/2011   Leucocytosis 03/28/2011   CKD (chronic kidney disease), stage III ([place]) 03/28/2011   Hyponatremia 02/02/2011   BPH (benign prostatic hyperplasia) 02/01/2011   Cholecystitis chronic, acute 01/26/2011   Dyslipidemia 01/22/2011   Abdominal bloating 08/07/2010   Loose stools 08/07/2010   Coronary atherosclerosis 10/26/2009   Essential hypertension 06/07/2009   DIVERTICULOSIS OF COLON 10/31/2008   DIVERTICULOSIS, COLON, WITH HEMORRHAGE, surg 12/11  10/31/2008   Personal history of colonic polyps 10/31/2008   Willow Ora, PTA, [facility] Outpatient Neuro [facility] [address] [place], [place] [address] [phone] 03/13/21, 7:25 PM  Cherly Anderson, PT, DPT, NCS  Name: Gary [name] MRN: [id number] Date of Birth: 16-Feb-1932

## 2021-03-14 ENCOUNTER — Ambulatory Visit: Payer: Medicare PPO | Admitting: Diagnostic Neuroimaging

## 2021-03-16 ENCOUNTER — Other Ambulatory Visit: Payer: Self-pay

## 2021-03-16 ENCOUNTER — Encounter: Payer: Self-pay | Admitting: Physical Therapy

## 2021-03-16 ENCOUNTER — Ambulatory Visit: Payer: Medicare PPO | Attending: Internal Medicine | Admitting: Physical Therapy

## 2021-03-16 DIAGNOSIS — M6281 Muscle weakness (generalized): Secondary | ICD-10-CM | POA: Diagnosis present

## 2021-03-16 DIAGNOSIS — R2689 Other abnormalities of gait and mobility: Secondary | ICD-10-CM | POA: Insufficient documentation

## 2021-03-16 DIAGNOSIS — R2681 Unsteadiness on feet: Secondary | ICD-10-CM | POA: Insufficient documentation

## 2021-03-16 NOTE — Therapy (Signed)
[facility] [address] [place], [place], [address] Phone: [phone]   Fax:  [phone]  Physical Therapy Treatment  Patient Details  Name: Gary [name] MRN: [id number] Date of Birth: 15-Feb-1932 Referring Provider (PT): Andrey Spearman   Encounter Date: 03/16/2021   PT End of Session - 03/16/21 1326     Visit Number 3    Number of Visits 17    Date for PT Re-Evaluation 04/27/21    Authorization Type [employer] so 10th visit progress note    Authorization Time Period 17 visits 03/01/21 through 05/03/21    Authorization - Visit Number 3    Authorization - Number of Visits 17    Progress Note Due on Visit 10    PT Start Time 1320    PT Stop Time 1400    PT Time Calculation (min) 40 min    Activity Tolerance Patient tolerated treatment well    Behavior During Therapy [facility] for tasks assessed/performed             Past Medical History:  Diagnosis Date   Adenomatous colon polyp    2010    Anemia    Anxiety    Arthritis    "hips and lower back"   Arthritis pain    Atypical mole 06/21/2003   left neck, inferior - slight to moderate atypia   Basal cell carcinoma 10/04/2014   right shin bcc (tx cx3, 26fu )   Blood transfusion    Chest pain 03/29/2011   2D Echo without contrast on 03/29/11 - EF= 55-60%.   Cholecystitis    Chronic back pain greater than 3 months duration    Chronic kidney disease    Colon polyps    Complication of anesthesia    once slow to wake up   Coronary angioplasty status 2011   Coronary artery disease    Diarrhea    Diverticulosis    Dyslipidemia    GERD (gastroesophageal reflux disease)    GI bleeding after 07/2009   "triggered by Plavix & ASA; from my diverticulosis"   H/O Clostridium difficile infection    H/O myocardial perfusion scan 04/04/2011   For Pre-noncardiac surgery - EF = 67%, no ischemia, and is considered low risk.   Heart attack ([place]) 07/2009   nonSTEMI  with an occluded circumflex artery and collaterals.   Heart murmur    Hemorrhoids    History of shingles    Hyperlipidemia    Hypertension    Kidney stones    Lower extremity edema    Taking furosemide and it seems to be helping.   Neuromuscular disorder ([place])    Nodular infiltrative basal cell carcinoma (BCC) 02/08/2021   Right Forearm-anterior (tx p bx)   Nonspecific chest pain 08/26/2012   Went to ED 08/26/12   Parkinsonism ([place])    SCCA (squamous cell carcinoma) of skin 02/08/2021   Right Dorsal Hand (well diff) (tx p bx)   Squamous cell carcinoma of skin 05/13/2000   Right outer forehead   Squamous cell carcinoma of skin 03/02/2001   Post crown - tx 03/19/2001   Squamous cell carcinoma of skin 09/01/2001   Left post auricular - tx 10/09/2001   Squamous cell carcinoma of skin 06/21/2003   Top right ear - tx 07/05/2003   Squamous cell carcinoma of skin 06/21/2003   Right upper arm - tx 07/05/2003   Squamous cell carcinoma of skin 10/12/2003   Left superior ear lobe - [facility]  Squamous cell carcinoma of skin 12/15/2006   Right ear, superior - tx 01/26/2007   Squamous cell carcinoma of skin 03/13/2009   Right temple - tx 05/08/2009   Squamous cell carcinoma of skin 03/24/2013   Upper left forearm - tx p bx   Squamous cell carcinoma of skin 12/06/2013   Right forearm - CX3 + 5FU   Squamous cell carcinoma of skin 02/10/2014   Right hand - tx p bx   Squamous cell carcinoma of skin 10/04/2014   Right shin - tx p bx   Squamous cell carcinoma of skin 08/21/2016   Left hand - tx p bx   Squamous cell carcinoma of skin 01/01/2017   Right hand - tx 02/06/2017   Squamous cell carcinoma of skin 10/21/2017   Left side of scalp - tx 03/12/2018   Squamous cell carcinoma of skin 10/21/2017   Left post scalp, inf - tx 08/13/2018   Squamous cell carcinoma of skin 10/21/2017   Right scalp - tx p bx   Squamous cell carcinoma of skin 10/21/2017   Left cheek - tx 03/12/2018   Squamous  cell carcinoma of skin 08/13/2018   Left top hand, proximal pinky - tx p bx   Squamous cell carcinoma of skin 12/23/2018   Right bicep lower - tx p bx   Squamous cell carcinoma of skin 05/18/2019   Right 4th finger metacarpophalangeal joint   Squamous cell carcinoma of skin 05/18/2019   Left 5th finger metacarpophalangeal joint   Squamous cell carcinoma of skin 05/18/2019   Left 2nd finger metacarpophalangeal joint   Stomach problems    Syncope and collapse 04/16/2018    Past Surgical History:  Procedure Laterality Date   [place]  08/09/2009   Circumflex 100% occluded with right-to-left collaterals, mild irregularities in the proximal and distal LAD and diagonal system, normal LV systolic function, and minor infrarenal irregularities, but no abdominal aortic aneurysm.   CHOLECYSTECTOMY  04/12/2011   Procedure: CHOLECYSTECTOMY;  Surgeon: Adin Hector, MD;  Location: [place];  Service: General;  Laterality: N/A;   CHOLECYSTECTOMY  04/12/2011   Procedure: LAPAROSCOPIC CHOLECYSTECTOMY;  Surgeon: Adin Hector, MD;  Location: [place];  Service: General;  Laterality: N/A;  converted to open   COLON SURGERY     COLONOSCOPY   ESOPHAGOGASTRODUODENOSCOPY N/A 08/05/2013   Procedure: ESOPHAGOGASTRODUODENOSCOPY (EGD);  Surgeon: Jerene Bears, MD;  Location: [place];  Service: Gastroenterology;  Laterality: N/A;   EYE SURGERY  1942   "right eye was crossed; they  corrected it"   LUMBAR FUSION  07/22/2012   PACEMAKER IMPLANT N/A 04/17/2018   Procedure: PACEMAKER IMPLANT;  Surgeon: Deboraha Sprang, MD;  Location: [place] [facility];  Service: Cardiovascular;  Laterality: N/A;   POSTERIOR FUSION LUMBAR SPINE  08/1995   "bottom 4 vertebra"   SUBTOTAL COLECTOMY  01/24/2010   TOTAL HIP ARTHROPLASTY Right 08/03/2013   Procedure: TOTAL HIP ARTHROPLASTY ANTERIOR APPROACH;  Surgeon: Hessie Dibble, MD;  Location: [place];  Service: Orthopedics;  Laterality: Right;     There were no vitals filed for this visit.   Subjective Assessment - 03/16/21 1325     Subjective No new complaints. No falls. Did have some back pain from standing so much with last session.    Patient is accompained by: Family member   spouse, Lelon Frohlich, in car   Pertinent History anxiety, arthritis, basal cell carcinoma, back pain, chronic kidney disease, CAD, MI,  heart murmur, HTN, lumbar fusion, pacemaker, right THR, mild memory loss                               [facility] Adult PT Treatment/Exercise - 03/16/21 1326       Transfers   Transfers Sit to Stand;Stand to Sit    Sit to Stand 5: Supervision;With upper extremity assist    Stand to Sit 5: Supervision      Ambulation/Gait   Ambulation/Gait Yes    Ambulation/Gait Assistance 4: Min guard    Ambulation Distance (Feet) --   around clinic with session   Assistive device Rolling walker    Gait Pattern Step-through pattern;Decreased stride length;Shuffle;Trunk flexed    Ambulation Surface Level;Indoor      Exercises   Exercises Other Exercises    Other Exercises  issued HEP to address strengthening/stretching. Refer to [place] for full details. Cues on correct ex form and technique.            Issued to HEP this session:  Access Code: [facility] URL: [email or web address] Date: 03/16/2021 Prepared by: Willow Ora  Exercises Supine Bridge - 1 x daily - 5 x weekly - 1 sets - 10 reps Small Range Straight Leg Raise - 1 x daily - 5 x weekly - 1 sets - 10 reps Sit to Stand with Armchair - 1 x daily - 5 x weekly - 1 sets - 10 reps Seated Hamstring Stretch - 1 x daily - 5 x weekly - 1 sets - 3 reps - 30 hold Heel Raises with Counter Support - 1 x daily - 5 x weekly - 1 sets - 10 reps Standing March with Counter Support - 1 x daily - 5 x weekly - 1 sets - 10 reps        PT Education - 03/16/21 1405     Education Details issued HEP today for strengthening and hamstring stretching     Person(s) Educated Patient    Methods Explanation;Demonstration;Verbal cues;Handout    Comprehension Verbalized understanding;Returned demonstration;Verbal cues required;Need further instruction              PT Short Term Goals - 03/15/21 0905       PT SHORT TERM GOAL #1   Title Pt will be able to perform HEP with supervision of wife for strengthening and balance to continue gains on own.    Time 4    Period Weeks    Status New    Target Date 03/29/21      PT SHORT TERM GOAL #2   Title Pt will decrease 5 x sit to stand from 38.01 sec to <33 sec for improved balance and functional strength.    Baseline 03/01/21 38.01 sec from chair with hands    Time 4    Period Weeks    Status New    Target Date 03/29/21      PT SHORT TERM GOAL #3   Title Pt will ambulate >250' with LRAD on level surfaces mod I for improved mobility in home.    Time 4    Period Weeks    Status New    Target Date 03/29/21      PT SHORT TERM GOAL #4   Title Berg TBD and LTG updated.    Baseline 36/56 and LTG updated by PT    Status Achieved               PT  Long Term Goals - 03/15/21 0906       PT LONG TERM GOAL #1   Title Pt will be report decrease in low back/right hip pain to 6/10 at worst with activities.    Baseline 8-9/10 at worst currently, 0/10 at rest    Time 8    Period Weeks    Status New    Target Date 04/27/21      PT LONG TERM GOAL #2   Title Pt will decrease TUG from 38.78 sec to <32 sec for improved balance and functional mobility.    Baseline 03/01/21 38.78 sec    Time 8    Period Weeks    Status New    Target Date 04/27/21      PT LONG TERM GOAL #3   Title Pt will ambulate >500' on varied level surfaces with LRAD mod I for improved short community distances.    Time 8    Period Weeks    Status New    Target Date 04/27/21      PT LONG TERM GOAL #4   Title Pt will ambulate up/down 1 step with cane to safely enter/exit home.    Time 8    Period Weeks    Status  New    Target Date 04/27/21      PT LONG TERM GOAL #5   Title Pt iwll increase gait speed to >0.25m/s for improved household mobility.    Baseline 03/01/21 0.58m/s    Time 8    Period Weeks    Status New    Target Date 04/27/21      PT LONG TERM GOAL #6   Title Pt will increase Berg Balance to >40/56 for improved balance and decreased fall risk.    Baseline PT updated per baseline of 36/56    Time 8    Period Weeks    Status Revised    Target Date 04/27/21                   Plan - 03/16/21 1326     Clinical Impression Statement Today's skilled session focused on establishment of an HEP for flexibility and strengthening. No issues noted or reported in session. Rest breaks needed due to pt reported shortness of breath with activity that resolved with rest breaks. Redirection to task needed at times as pt tended to go off on tanget conversation. The pt is progressing toward goals and should benefit from continued PT to progress toward unmet goals.    Personal Factors and Comorbidities Comorbidity 3+;Age    Comorbidities anxiety, arthritis, basal cell carcinoma, back pain, chronic kidney disease, CAD, MI, heart murmur, HTN, lumbar fusion, pacemaker, right THR, mild memory loss    Examination-Activity Limitations Locomotion Level;Transfers;Stand;Stairs;Squat    Examination-Participation Restrictions Cleaning;Meal Prep;Community Activity    Stability/Clinical Decision Making Evolving/Moderate complexity    Rehab Potential Good    PT Frequency 2x / week   plus eval   PT Duration 8 weeks    PT Treatment/Interventions ADLs/Self Care Home Management;DME Instruction;Cryotherapy;Moist Heat;Gait training;Stair training;Functional mobility training;Therapeutic activities;Therapeutic exercise;Balance training;Neuromuscular re-education;Manual techniques;Passive range of motion;Vestibular;Patient/family education    PT Next Visit Plan Show pt and wife how to fold up rollator so they can get  in car. continue to focus on  functional strengthening activities like sit to stand, standing balance and gait with walker             Patient will benefit from skilled therapeutic intervention in order to  improve the following deficits and impairments:  Abnormal gait, Cardiopulmonary status limiting activity, Decreased activity tolerance, Decreased balance, Decreased cognition, Decreased mobility, Decreased strength, Decreased knowledge of use of DME, Decreased endurance, Pain  Visit Diagnosis: Other abnormalities of gait and mobility  Muscle weakness (generalized)  Unsteadiness on feet     Problem List Patient Active Problem List   Diagnosis Date Noted   Vitamin B12 deficiency 04/15/2019   Abnormal glucose 04/15/2019   Stage 4 chronic kidney disease ([place]) 04/15/2019   Dyspnea on exertion 04/15/2019   Pulmonary hypertension ([place]) 04/08/2019   Chest pain 03/27/2019   Complete heart block (HCC) 04/17/2018   Syncope and collapse 04/16/2018   Sinus pause 04/16/2018   Snoring 10/03/2017   Other fatigue 10/03/2017   Pre-syncope 05/14/2016   Murmur 05/14/2016   CAD in native artery 10/31/2015   History of MI (myocardial infarction) 10/31/2015   Preoperative cardiovascular examination 12/26/2014   History of colonic polyps 01/17/2014   PAF (paroxysmal atrial fibrillation) ([place]) 41/28/7867   Metabolic acidosis 67/20/9470   Acute renal failure ([place]) 08/13/2013   Stage 3b chronic kidney disease ([place]) 08/13/2013   Anemia in chronic kidney disease 08/13/2013   Hypokalemia 08/12/2013   Acute blood loss anemia 08/09/2013   Encephalopathy 08/09/2013   Severe sepsis (HCC) 08/08/2013   Hypotension, unspecified 08/07/2013   Complex renal cyst 08/07/2013   Renal mass, left 08/06/2013   Upper GI bleeding 08/05/2013   Acute esophagitis 08/05/2013   Urinary retention 08/04/2013   S/P total hip arthroplasty 08/03/2013   Degenerative joint disease (DJD) of hip 08/03/2013    Class:  Chronic   Internal hemorrhoids with other complication 96/28/3662   Tinnitus 10/22/2012   C. difficile colitis 07/26/2012   Ileus, postoperative ([place]) 07/24/2012   Somnolence 07/24/2012   Anxiety state 07/24/2012   Chest pain- ER 08/26/12 03/30/2011   Fever 03/28/2011   Leucocytosis 03/28/2011   CKD (chronic kidney disease), stage III ([place]) 03/28/2011   Hyponatremia 02/02/2011   BPH (benign prostatic hyperplasia) 02/01/2011   Cholecystitis chronic, acute 01/26/2011   Dyslipidemia 01/22/2011   Abdominal bloating 08/07/2010   Loose stools 08/07/2010   Coronary atherosclerosis 10/26/2009   Essential hypertension 06/07/2009   DIVERTICULOSIS OF COLON 10/31/2008   DIVERTICULOSIS, COLON, WITH HEMORRHAGE, surg 12/11  10/31/2008   Personal history of colonic polyps 10/31/2008    Willow Ora, PTA, [facility] [address] [place], [place] [address] [phone] 03/17/21, 9:11 PM    Name: Gary [name] MRN: [id number] Date of Birth: 06-05-1932

## 2021-03-16 NOTE — Patient Instructions (Signed)
Access Code: [facility] URL: [email or web address] Date: 03/16/2021 Prepared by: Willow Ora  Exercises Supine Bridge - 1 x daily - 5 x weekly - 1 sets - 10 reps Small Range Straight Leg Raise - 1 x daily - 5 x weekly - 1 sets - 10 reps Sit to Stand with Armchair - 1 x daily - 5 x weekly - 1 sets - 10 reps Seated Hamstring Stretch - 1 x daily - 5 x weekly - 1 sets - 3 reps - 30 hold Heel Raises with Counter Support - 1 x daily - 5 x weekly - 1 sets - 10 reps Standing March with Counter Support - 1 x daily - 5 x weekly - 1 sets - 10 reps

## 2021-03-20 ENCOUNTER — Ambulatory Visit: Payer: Medicare PPO | Admitting: Physical Therapy

## 2021-03-23 ENCOUNTER — Ambulatory Visit: Payer: Medicare PPO | Admitting: Physical Therapy

## 2021-03-23 ENCOUNTER — Other Ambulatory Visit: Payer: Self-pay

## 2021-03-23 ENCOUNTER — Encounter: Payer: Self-pay | Admitting: Physical Therapy

## 2021-03-23 DIAGNOSIS — R2681 Unsteadiness on feet: Secondary | ICD-10-CM

## 2021-03-23 DIAGNOSIS — M6281 Muscle weakness (generalized): Secondary | ICD-10-CM

## 2021-03-23 DIAGNOSIS — R2689 Other abnormalities of gait and mobility: Secondary | ICD-10-CM | POA: Diagnosis not present

## 2021-03-25 NOTE — Therapy (Signed)
[facility] [address] [place], [place], [address] Phone: [phone]   Fax:  [phone]  Physical Therapy Treatment  Patient Details  Name: Gary [name] MRN: [id number] Date of Birth: Sep 24, 1932 Referring Provider (PT): Andrey Spearman   Encounter Date: 03/23/2021    03/23/21 1415  PT Visits / Re-Eval  Visit Number 4  Number of Visits 17  Date for PT Re-Evaluation 04/27/21  Authorization  Authorization Type [employer] so 10th visit progress note  Authorization Time Period 17 visits 03/01/21 through 05/03/21  Authorization - Visit Number 4  Authorization - Number of Visits 17  Progress Note Due on Visit 10  PT Time Calculation  PT Start Time 0102  PT Stop Time 7253  PT Time Calculation (min) 43 min  PT - End of Session  Activity Tolerance Patient tolerated treatment well  Behavior During Therapy [facility] for tasks assessed/performed    Past Medical History:  Diagnosis Date   Adenomatous colon polyp    2010    Anemia    Anxiety    Arthritis    "hips and lower back"   Arthritis pain    Atypical mole 06/21/2003   left neck, inferior - slight to moderate atypia   Basal cell carcinoma 10/04/2014   right shin bcc (tx cx3, 72fu )   Blood transfusion    Chest pain 03/29/2011   2D Echo without contrast on 03/29/11 - EF= 55-60%.   Cholecystitis    Chronic back pain greater than 3 months duration    Chronic kidney disease    Colon polyps    Complication of anesthesia    once slow to wake up   Coronary angioplasty status 2011   Coronary artery disease    Diarrhea    Diverticulosis    Dyslipidemia    GERD (gastroesophageal reflux disease)    GI bleeding after 07/2009   "triggered by Plavix & ASA; from my diverticulosis"   H/O Clostridium difficile infection    H/O myocardial perfusion scan 04/04/2011   For Pre-noncardiac surgery - EF = 67%, no ischemia, and is considered low risk.   Heart attack ([place])  07/2009   nonSTEMI with an occluded circumflex artery and collaterals.   Heart murmur    Hemorrhoids    History of shingles    Hyperlipidemia    Hypertension    Kidney stones    Lower extremity edema    Taking furosemide and it seems to be helping.   Neuromuscular disorder ([place])    Nodular infiltrative basal cell carcinoma (BCC) 02/08/2021   Right Forearm-anterior (tx p bx)   Nonspecific chest pain 08/26/2012   Went to ED 08/26/12   Parkinsonism ([place])    SCCA (squamous cell carcinoma) of skin 02/08/2021   Right Dorsal Hand (well diff) (tx p bx)   Squamous cell carcinoma of skin 05/13/2000   Right outer forehead   Squamous cell carcinoma of skin 03/02/2001   Post crown - tx 03/19/2001   Squamous cell carcinoma of skin 09/01/2001   Left post auricular - tx 10/09/2001   Squamous cell carcinoma of skin 06/21/2003   Top right ear - tx 07/05/2003   Squamous cell carcinoma of skin 06/21/2003   Right upper arm - tx 07/05/2003   Squamous cell carcinoma of skin 10/12/2003   Left superior ear lobe - MOHs   Squamous cell carcinoma of skin 12/15/2006   Right ear, superior - tx 01/26/2007   Squamous cell carcinoma of skin  03/13/2009   Right temple - tx 05/08/2009   Squamous cell carcinoma of skin 03/24/2013   Upper left forearm - tx p bx   Squamous cell carcinoma of skin 12/06/2013   Right forearm - CX3 + 5FU   Squamous cell carcinoma of skin 02/10/2014   Right hand - tx p bx   Squamous cell carcinoma of skin 10/04/2014   Right shin - tx p bx   Squamous cell carcinoma of skin 08/21/2016   Left hand - tx p bx   Squamous cell carcinoma of skin 01/01/2017   Right hand - tx 02/06/2017   Squamous cell carcinoma of skin 10/21/2017   Left side of scalp - tx 03/12/2018   Squamous cell carcinoma of skin 10/21/2017   Left post scalp, inf - tx 08/13/2018   Squamous cell carcinoma of skin 10/21/2017   Right scalp - tx p bx   Squamous cell carcinoma of skin 10/21/2017   Left cheek - tx  03/12/2018   Squamous cell carcinoma of skin 08/13/2018   Left top hand, proximal pinky - tx p bx   Squamous cell carcinoma of skin 12/23/2018   Right bicep lower - tx p bx   Squamous cell carcinoma of skin 05/18/2019   Right 4th finger metacarpophalangeal joint   Squamous cell carcinoma of skin 05/18/2019   Left 5th finger metacarpophalangeal joint   Squamous cell carcinoma of skin 05/18/2019   Left 2nd finger metacarpophalangeal joint   Stomach problems    Syncope and collapse 04/16/2018    Past Surgical History:  Procedure Laterality Date   [place]  08/09/2009   Circumflex 100% occluded with right-to-left collaterals, mild irregularities in the proximal and distal LAD and diagonal system, normal LV systolic function, and minor infrarenal irregularities, but no abdominal aortic aneurysm.   CHOLECYSTECTOMY  04/12/2011   Procedure: CHOLECYSTECTOMY;  Surgeon: Adin Hector, MD;  Location: [facility];  Service: General;  Laterality: N/A;   CHOLECYSTECTOMY  04/12/2011   Procedure: LAPAROSCOPIC CHOLECYSTECTOMY;  Surgeon: Adin Hector, MD;  Location: [facility];  Service: General;  Laterality: N/A;  converted to open   COLON SURGERY     COLONOSCOPY   ESOPHAGOGASTRODUODENOSCOPY N/A 08/05/2013   Procedure: ESOPHAGOGASTRODUODENOSCOPY (EGD);  Surgeon: Jerene Bears, MD;  Location: [facility];  Service: Gastroenterology;  Laterality: N/A;   EYE SURGERY  1942   "right eye was crossed; they  corrected it"   LUMBAR FUSION  07/22/2012   PACEMAKER IMPLANT N/A 04/17/2018   Procedure: PACEMAKER IMPLANT;  Surgeon: Deboraha Sprang, MD;  Location: [facility];  Service: Cardiovascular;  Laterality: N/A;   POSTERIOR FUSION LUMBAR SPINE  08/1995   "bottom 4 vertebra"   SUBTOTAL COLECTOMY  01/24/2010   TOTAL HIP ARTHROPLASTY Right 08/03/2013   Procedure: TOTAL HIP ARTHROPLASTY ANTERIOR APPROACH;  Surgeon: Hessie Dibble, MD;  Location: [facility];  Service: Orthopedics;   Laterality: Right;    There were no vitals filed for this visit.     03/23/21 1407  Symptoms/Limitations  Subjective Missed earlier this week due to being sore/in pain from fall Friday night/early Saturday morning. Was in the bathroom. When he turned to sit on the toilet he lost his balance and fell across the tub. Hit his head on the far end of the tub. Ann called EMS and they came and helped him out of the tub. Has not done the ex's since then because of soreness. Feeling better  today.  Patient is accompained by: Family member (spouse Ann in car)  Pertinent History anxiety, arthritis, basal cell carcinoma, back pain, chronic kidney disease, CAD, MI, heart murmur, HTN, lumbar fusion, pacemaker, right THR, mild memory loss  Patient Stated Goals Pt would like to decrease his back and hip pain as well as improve his balance and walking.  Pain Assessment  Currently in Pain? No/denies  Pain Score 0        03/23/21 1424  Transfers  Transfers Sit to Stand;Stand to Sit  Sit to Stand 5: Supervision;With upper extremity assist  Stand to Sit 5: Supervision  Ambulation/Gait  Ambulation/Gait Yes  Ambulation/Gait Assistance 4: Min guard  Ambulation/Gait Assistance Details cues on upright posture, to stay within the walker when going around obstacles and for to stay closer to walker when within it.  Ambulation Distance (Feet) 60 (x1,  100 x1)  Assistive device Rolling walker  Gait Pattern Step-through pattern;Decreased stride length;Shuffle;Trunk flexed  Ambulation Surface Level;Indoor  Exercises  Exercises Other Exercises  Other Exercises  seated at edge of mat: with 2# ankle weights for long arc quads, then alternating marching x 10 reps each bil sides; then in standing with UE support on sturdy surface for: heel raises, then alternating hip side kicks for 10 reps each bil sides. min guard assist for safety.  Knee/Hip Exercises: Aerobic  Nustep bil UE/LE's on level 2.0 x 8 minutes with  goal >/= 60 steps per minute for strengthening and activity tolerance.                      PT Short Term Goals - 03/15/21 0905       PT SHORT TERM GOAL #1   Title Pt will be able to perform HEP with supervision of wife for strengthening and balance to continue gains on own.    Time 4    Period Weeks    Status New    Target Date 03/29/21      PT SHORT TERM GOAL #2   Title Pt will decrease 5 x sit to stand from 38.01 sec to <33 sec for improved balance and functional strength.    Baseline 03/01/21 38.01 sec from chair with hands    Time 4    Period Weeks    Status New    Target Date 03/29/21      PT SHORT TERM GOAL #3   Title Pt will ambulate >250' with LRAD on level surfaces mod I for improved mobility in home.    Time 4    Period Weeks    Status New    Target Date 03/29/21      PT SHORT TERM GOAL #4   Title Berg TBD and LTG updated.    Baseline 36/56 and LTG updated by PT    Status Achieved               PT Long Term Goals - 03/15/21 0906       PT LONG TERM GOAL #1   Title Pt will be report decrease in low back/right hip pain to 6/10 at worst with activities.    Baseline 8-9/10 at worst currently, 0/10 at rest    Time 8    Period Weeks    Status New    Target Date 04/27/21      PT LONG TERM GOAL #2   Title Pt will decrease TUG from 38.78 sec to <32 sec for improved balance and functional mobility.  Baseline 03/01/21 38.78 sec    Time 8    Period Weeks    Status New    Target Date 04/27/21      PT LONG TERM GOAL #3   Title Pt will ambulate >500' on varied level surfaces with LRAD mod I for improved short community distances.    Time 8    Period Weeks    Status New    Target Date 04/27/21      PT LONG TERM GOAL #4   Title Pt will ambulate up/down 1 step with cane to safely enter/exit home.    Time 8    Period Weeks    Status New    Target Date 04/27/21      PT LONG TERM GOAL #5   Title Pt iwll increase gait speed to >0.24m/s  for improved household mobility.    Baseline 03/01/21 0.37m/s    Time 8    Period Weeks    Status New    Target Date 04/27/21      PT LONG TERM GOAL #6   Title Pt will increase Berg Balance to >40/56 for improved balance and decreased fall risk.    Baseline PT updated per baseline of 36/56    Time 8    Period Weeks    Status Revised    Target Date 04/27/21               03/23/21 1424  Plan  Clinical Impression Statement Today's skilled session continued to focus on strengthening, gait with RW and activity tolerance with no issues noted or reported other than mild shortness of breath with activity that resloves quickly with brief rest breaks. Pt reports that this week there bathtub is being replaced with a walk in shower with grab bars/seat and other grap bars being added in bathroom for increased safety. The pt is making progress toward goals and should benefit from continued PT to progress toward unmet goals.  Personal Factors and Comorbidities Comorbidity 3+;Age  Comorbidities anxiety, arthritis, basal cell carcinoma, back pain, chronic kidney disease, CAD, MI, heart murmur, HTN, lumbar fusion, pacemaker, right THR, mild memory loss  Examination-Activity Limitations Locomotion Level;Transfers;Stand;Stairs;Squat  Examination-Participation Restrictions Cleaning;Meal Prep;Community Activity  Pt will benefit from skilled therapeutic intervention in order to improve on the following deficits Abnormal gait;Cardiopulmonary status limiting activity;Decreased activity tolerance;Decreased balance;Decreased cognition;Decreased mobility;Decreased strength;Decreased knowledge of use of DME;Decreased endurance;Pain  Stability/Clinical Decision Making Evolving/Moderate complexity  Rehab Potential Good  PT Frequency 2x / week (plus eval)  PT Duration 8 weeks  PT Treatment/Interventions ADLs/Self Care Home Management;DME Instruction;Cryotherapy;Moist Heat;Gait training;Stair training;Functional  mobility training;Therapeutic activities;Therapeutic exercise;Balance training;Neuromuscular re-education;Manual techniques;Passive range of motion;Vestibular;Patient/family education  PT Next Visit Plan continue to focus on  functional strengthening activities like sit to stand, standing balance and gait with walker         Patient will benefit from skilled therapeutic intervention in order to improve the following deficits and impairments:  Abnormal gait, Cardiopulmonary status limiting activity, Decreased activity tolerance, Decreased balance, Decreased cognition, Decreased mobility, Decreased strength, Decreased knowledge of use of DME, Decreased endurance, Pain  Visit Diagnosis: Other abnormalities of gait and mobility  Muscle weakness (generalized)  Unsteadiness on feet     Problem List Patient Active Problem List   Diagnosis Date Noted   Vitamin B12 deficiency 04/15/2019   Abnormal glucose 04/15/2019   Stage 4 chronic kidney disease ([place]) 04/15/2019   Dyspnea on exertion 04/15/2019   Pulmonary hypertension ([place]) 04/08/2019   Chest pain  03/27/2019   Complete heart block (HCC) 04/17/2018   Syncope and collapse 04/16/2018   Sinus pause 04/16/2018   Snoring 10/03/2017   Other fatigue 10/03/2017   Pre-syncope 05/14/2016   Murmur 05/14/2016   CAD in native artery 10/31/2015   History of MI (myocardial infarction) 10/31/2015   Preoperative cardiovascular examination 12/26/2014   History of colonic polyps 01/17/2014   PAF (paroxysmal atrial fibrillation) (HCC) 11/17/1217   Metabolic acidosis 75/88/3254   Acute renal failure ([place]) 08/13/2013   Stage 3b chronic kidney disease ([place]) 08/13/2013   Anemia in chronic kidney disease 08/13/2013   Hypokalemia 08/12/2013   Acute blood loss anemia 08/09/2013   Encephalopathy 08/09/2013   Severe sepsis (HCC) 08/08/2013   Hypotension, unspecified 08/07/2013   Complex renal cyst 08/07/2013   Renal mass, left 08/06/2013   Upper GI  bleeding 08/05/2013   Acute esophagitis 08/05/2013   Urinary retention 08/04/2013   S/P total hip arthroplasty 08/03/2013   Degenerative joint disease (DJD) of hip 08/03/2013    Class: Chronic   Internal hemorrhoids with other complication 98/26/4158   Tinnitus 10/22/2012   C. difficile colitis 07/26/2012   Ileus, postoperative ([place]) 07/24/2012   Somnolence 07/24/2012   Anxiety state 07/24/2012   Chest pain- ER 08/26/12 03/30/2011   Fever 03/28/2011   Leucocytosis 03/28/2011   CKD (chronic kidney disease), stage III ([place]) 03/28/2011   Hyponatremia 02/02/2011   BPH (benign prostatic hyperplasia) 02/01/2011   Cholecystitis chronic, acute 01/26/2011   Dyslipidemia 01/22/2011   Abdominal bloating 08/07/2010   Loose stools 08/07/2010   Coronary atherosclerosis 10/26/2009   Essential hypertension 06/07/2009   DIVERTICULOSIS OF COLON 10/31/2008   DIVERTICULOSIS, COLON, WITH HEMORRHAGE, surg 12/11  10/31/2008   Personal history of colonic polyps 10/31/2008    Willow Ora, PTA 03/25/2021, 4:38 PM  [facility] [address] [place], [place], [address] Phone: [phone]   Fax:  [phone]  Name: Gary [name] MRN: [id number] Date of Birth: [birth year]

## 2021-03-27 ENCOUNTER — Encounter: Payer: Self-pay | Admitting: Physical Therapy

## 2021-03-27 ENCOUNTER — Ambulatory Visit: Payer: Medicare PPO | Admitting: Physical Therapy

## 2021-03-27 ENCOUNTER — Other Ambulatory Visit: Payer: Self-pay

## 2021-03-27 DIAGNOSIS — R2689 Other abnormalities of gait and mobility: Secondary | ICD-10-CM | POA: Diagnosis not present

## 2021-03-27 DIAGNOSIS — R2681 Unsteadiness on feet: Secondary | ICD-10-CM

## 2021-03-27 DIAGNOSIS — M6281 Muscle weakness (generalized): Secondary | ICD-10-CM

## 2021-03-27 NOTE — Therapy (Signed)
[facility] [address] [place], [place], [address] Phone: [phone]   Fax:  [phone]  Physical Therapy Treatment  Patient Details  Name: Gary [name] MRN: [id number] Date of Birth: [birth year] Referring Provider (PT): Andrey Spearman   Encounter Date: 03/27/2021   PT End of Session - 03/27/21 1110     Visit Number 5    Number of Visits 17    Date for PT Re-Evaluation 04/27/21    Authorization Type [employer] so 10th visit progress note    Authorization Time Period 17 visits 03/01/21 through 05/03/21    Authorization - Visit Number 5    Authorization - Number of Visits 17    Progress Note Due on Visit 10    PT Start Time 1105    PT Stop Time 1145    PT Time Calculation (min) 40 min    Activity Tolerance Patient tolerated treatment well    Behavior During Therapy [facility] for tasks assessed/performed             Past Medical History:  Diagnosis Date   Adenomatous colon polyp    2010    Anemia    Anxiety    Arthritis    "hips and lower back"   Arthritis pain    Atypical mole 06/21/2003   left neck, inferior - slight to moderate atypia   Basal cell carcinoma 10/04/2014   right shin bcc (tx cx3, 22fu )   Blood transfusion    Chest pain 03/29/2011   2D Echo without contrast on 03/29/11 - EF= 55-60%.   Cholecystitis    Chronic back pain greater than 3 months duration    Chronic kidney disease    Colon polyps    Complication of anesthesia    once slow to wake up   Coronary angioplasty status 2011   Coronary artery disease    Diarrhea    Diverticulosis    Dyslipidemia    GERD (gastroesophageal reflux disease)    GI bleeding after 07/2009   "triggered by Plavix & ASA; from my diverticulosis"   H/O Clostridium difficile infection    H/O myocardial perfusion scan 04/04/2011   For Pre-noncardiac surgery - EF = 67%, no ischemia, and is considered low risk.   Heart attack ([place]) 07/2009   nonSTEMI  with an occluded circumflex artery and collaterals.   Heart murmur    Hemorrhoids    History of shingles    Hyperlipidemia    Hypertension    Kidney stones    Lower extremity edema    Taking furosemide and it seems to be helping.   Neuromuscular disorder ([place])    Nodular infiltrative basal cell carcinoma (BCC) 02/08/2021   Right Forearm-anterior (tx p bx)   Nonspecific chest pain 08/26/2012   Went to ED 08/26/12   Parkinsonism ([place])    SCCA (squamous cell carcinoma) of skin 02/08/2021   Right Dorsal Hand (well diff) (tx p bx)   Squamous cell carcinoma of skin 05/13/2000   Right outer forehead   Squamous cell carcinoma of skin 03/02/2001   Post crown - tx 03/19/2001   Squamous cell carcinoma of skin 09/01/2001   Left post auricular - tx 10/09/2001   Squamous cell carcinoma of skin 06/21/2003   Top right ear - tx 07/05/2003   Squamous cell carcinoma of skin 06/21/2003   Right upper arm - tx 07/05/2003   Squamous cell carcinoma of skin 10/12/2003   Left superior ear lobe - [facility]  Squamous cell carcinoma of skin 12/15/2006   Right ear, superior - tx 01/26/2007   Squamous cell carcinoma of skin 03/13/2009   Right temple - tx 05/08/2009   Squamous cell carcinoma of skin 03/24/2013   Upper left forearm - tx p bx   Squamous cell carcinoma of skin 12/06/2013   Right forearm - CX3 + 5FU   Squamous cell carcinoma of skin 02/10/2014   Right hand - tx p bx   Squamous cell carcinoma of skin 10/04/2014   Right shin - tx p bx   Squamous cell carcinoma of skin 08/21/2016   Left hand - tx p bx   Squamous cell carcinoma of skin 01/01/2017   Right hand - tx 02/06/2017   Squamous cell carcinoma of skin 10/21/2017   Left side of scalp - tx 03/12/2018   Squamous cell carcinoma of skin 10/21/2017   Left post scalp, inf - tx 08/13/2018   Squamous cell carcinoma of skin 10/21/2017   Right scalp - tx p bx   Squamous cell carcinoma of skin 10/21/2017   Left cheek - tx 03/12/2018   Squamous  cell carcinoma of skin 08/13/2018   Left top hand, proximal pinky - tx p bx   Squamous cell carcinoma of skin 12/23/2018   Right bicep lower - tx p bx   Squamous cell carcinoma of skin 05/18/2019   Right 4th finger metacarpophalangeal joint   Squamous cell carcinoma of skin 05/18/2019   Left 5th finger metacarpophalangeal joint   Squamous cell carcinoma of skin 05/18/2019   Left 2nd finger metacarpophalangeal joint   Stomach problems    Syncope and collapse 04/16/2018    Past Surgical History:  Procedure Laterality Date   [place]  08/09/2009   Circumflex 100% occluded with right-to-left collaterals, mild irregularities in the proximal and distal LAD and diagonal system, normal LV systolic function, and minor infrarenal irregularities, but no abdominal aortic aneurysm.   CHOLECYSTECTOMY  04/12/2011   Procedure: CHOLECYSTECTOMY;  Surgeon: Adin Hector, MD;  Location: [facility];  Service: General;  Laterality: N/A;   CHOLECYSTECTOMY  04/12/2011   Procedure: LAPAROSCOPIC CHOLECYSTECTOMY;  Surgeon: Adin Hector, MD;  Location: [facility];  Service: General;  Laterality: N/A;  converted to open   COLON SURGERY     COLONOSCOPY   ESOPHAGOGASTRODUODENOSCOPY N/A 08/05/2013   Procedure: ESOPHAGOGASTRODUODENOSCOPY (EGD);  Surgeon: Jerene Bears, MD;  Location: [facility];  Service: Gastroenterology;  Laterality: N/A;   EYE SURGERY  1942   "right eye was crossed; they  corrected it"   LUMBAR FUSION  07/22/2012   PACEMAKER IMPLANT N/A 04/17/2018   Procedure: PACEMAKER IMPLANT;  Surgeon: Deboraha Sprang, MD;  Location: [facility];  Service: Cardiovascular;  Laterality: N/A;   POSTERIOR FUSION LUMBAR SPINE  08/1995   "bottom 4 vertebra"   SUBTOTAL COLECTOMY  01/24/2010   TOTAL HIP ARTHROPLASTY Right 08/03/2013   Procedure: TOTAL HIP ARTHROPLASTY ANTERIOR APPROACH;  Surgeon: Hessie Dibble, MD;  Location: [facility];  Service: Orthopedics;  Laterality: Right;     There were no vitals filed for this visit.   Subjective Assessment - 03/27/21 1108     Subjective No new complaints. No new falls. Does have some concerns about LE swelling. See's his Renal doctor in the next week or so.    Pertinent History anxiety, arthritis, basal cell carcinoma, back pain, chronic kidney disease, CAD, MI, heart murmur, HTN, lumbar fusion, pacemaker, right THR,  mild memory loss    Patient Stated Goals Pt would like to decrease his back and hip pain as well as improve his balance and walking.    Currently in Pain? No/denies    Pain Score 0-No pain                   [facility] Adult PT Treatment/Exercise - 03/27/21 1111       Transfers   Transfers Sit to Stand;Stand to Sit    Sit to Stand 5: Supervision;With upper extremity assist    Stand to Sit 5: Supervision    Comments observed pt's car transfer with cues needed for safety to avoid hitting head. pt able to bring self into car without assistance. spouse folds standard RW and places in trunk.      Ambulation/Gait   Ambulation/Gait Yes    Ambulation/Gait Assistance 4: Min guard    Ambulation/Gait Assistance Details reminder cues for tall posture and to stay within walker    Ambulation Distance (Feet) 45 Feet   x1, 75  x1, 175 x1 in/outdoors combined   Assistive device Rolling walker    Gait Pattern Step-through pattern;Decreased stride length;Shuffle;Trunk flexed    Ambulation Surface Level;Indoor;Unlevel;Outdoor;Paved      Exercises   Exercises Other Exercises    Other Exercises  standing with UE support on sturdy surface: with 2# ankles weight to bil LE's: heel raises, alteranting marching, alternaring hip side kicks and then alternating hip back kicks for 10 reps each/each side. cues on form and technique with min guard assist for safety. left>right hip weakness noted with ex's.      Knee/Hip Exercises: Aerobic   Nustep bil UE/LE's on level 3.0 x 8 minutes with goal >/= 60 steps per minute for  strengthening and activity tolerance.                       PT Short Term Goals - 03/15/21 0905       PT SHORT TERM GOAL #1   Title Pt will be able to perform HEP with supervision of wife for strengthening and balance to continue gains on own.    Time 4    Period Weeks    Status New    Target Date 03/29/21      PT SHORT TERM GOAL #2   Title Pt will decrease 5 x sit to stand from 38.01 sec to <33 sec for improved balance and functional strength.    Baseline 03/01/21 38.01 sec from chair with hands    Time 4    Period Weeks    Status New    Target Date 03/29/21      PT SHORT TERM GOAL #3   Title Pt will ambulate >250' with LRAD on level surfaces mod I for improved mobility in home.    Time 4    Period Weeks    Status New    Target Date 03/29/21      PT SHORT TERM GOAL #4   Title Berg TBD and LTG updated.    Baseline 36/56 and LTG updated by PT    Status Achieved               PT Long Term Goals - 03/15/21 0906       PT LONG TERM GOAL #1   Title Pt will be report decrease in low back/right hip pain to 6/10 at worst with activities.    Baseline 8-9/10 at worst currently, 0/10 at  rest    Time 8    Period Weeks    Status New    Target Date 04/27/21      PT LONG TERM GOAL #2   Title Pt will decrease TUG from 38.78 sec to <32 sec for improved balance and functional mobility.    Baseline 03/01/21 38.78 sec    Time 8    Period Weeks    Status New    Target Date 04/27/21      PT LONG TERM GOAL #3   Title Pt will ambulate >500' on varied level surfaces with LRAD mod I for improved short community distances.    Time 8    Period Weeks    Status New    Target Date 04/27/21      PT LONG TERM GOAL #4   Title Pt will ambulate up/down 1 step with cane to safely enter/exit home.    Time 8    Period Weeks    Status New    Target Date 04/27/21      PT LONG TERM GOAL #5   Title Pt iwll increase gait speed to >0.53m/s for improved household mobility.     Baseline 03/01/21 0.38m/s    Time 8    Period Weeks    Status New    Target Date 04/27/21      PT LONG TERM GOAL #6   Title Pt will increase Berg Balance to >40/56 for improved balance and decreased fall risk.    Baseline PT updated per baseline of 36/56    Time 8    Period Weeks    Status Revised    Target Date 04/27/21                   Plan - 03/27/21 1111     Clinical Impression Statement Today's skilled session continued to address activity tolerance, strengthening and gait with RW on various surfaces with rest breaks taken as needed due to fatigue. No other issues noted or reported in session. The pt is making progress and should benefit from continued PT to progress toward unmet goals.    Personal Factors and Comorbidities Comorbidity 3+;Age    Comorbidities anxiety, arthritis, basal cell carcinoma, back pain, chronic kidney disease, CAD, MI, heart murmur, HTN, lumbar fusion, pacemaker, right THR, mild memory loss    Examination-Activity Limitations Locomotion Level;Transfers;Stand;Stairs;Squat    Examination-Participation Restrictions Cleaning;Meal Prep;Community Activity    Stability/Clinical Decision Making Evolving/Moderate complexity    Rehab Potential Good    PT Frequency 2x / week   plus eval   PT Duration 8 weeks    PT Treatment/Interventions ADLs/Self Care Home Management;DME Instruction;Cryotherapy;Moist Heat;Gait training;Stair training;Functional mobility training;Therapeutic activities;Therapeutic exercise;Balance training;Neuromuscular re-education;Manual techniques;Passive range of motion;Vestibular;Patient/family education    PT Next Visit Plan continue to focus on  functional strengthening activities like sit to stand, standing balance and gait with walker    PT Home Exercise Plan Access Code: [facility]             Patient will benefit from skilled therapeutic intervention in order to improve the following deficits and impairments:  Abnormal gait,  Cardiopulmonary status limiting activity, Decreased activity tolerance, Decreased balance, Decreased cognition, Decreased mobility, Decreased strength, Decreased knowledge of use of DME, Decreased endurance, Pain  Visit Diagnosis: Other abnormalities of gait and mobility  Muscle weakness (generalized)  Unsteadiness on feet     Problem List Patient Active Problem List   Diagnosis Date Noted   Vitamin B12 deficiency 04/15/2019  Abnormal glucose 04/15/2019   Stage 4 chronic kidney disease ([place]) 04/15/2019   Dyspnea on exertion 04/15/2019   Pulmonary hypertension ([place]) 04/08/2019   Chest pain 03/27/2019   Complete heart block (HCC) 04/17/2018   Syncope and collapse 04/16/2018   Sinus pause 04/16/2018   Snoring 10/03/2017   Other fatigue 10/03/2017   Pre-syncope 05/14/2016   Murmur 05/14/2016   CAD in native artery 10/31/2015   History of MI (myocardial infarction) 10/31/2015   Preoperative cardiovascular examination 12/26/2014   History of colonic polyps 01/17/2014   PAF (paroxysmal atrial fibrillation) (HCC) 93/26/7124   Metabolic acidosis 58/10/9831   Acute renal failure ([place]) 08/13/2013   Stage 3b chronic kidney disease ([place]) 08/13/2013   Anemia in chronic kidney disease 08/13/2013   Hypokalemia 08/12/2013   Acute blood loss anemia 08/09/2013   Encephalopathy 08/09/2013   Severe sepsis (HCC) 08/08/2013   Hypotension, unspecified 08/07/2013   Complex renal cyst 08/07/2013   Renal mass, left 08/06/2013   Upper GI bleeding 08/05/2013   Acute esophagitis 08/05/2013   Urinary retention 08/04/2013   S/P total hip arthroplasty 08/03/2013   Degenerative joint disease (DJD) of hip 08/03/2013    Class: Chronic   Internal hemorrhoids with other complication 82/50/5397   Tinnitus 10/22/2012   C. difficile colitis 07/26/2012   Ileus, postoperative ([place]) 07/24/2012   Somnolence 07/24/2012   Anxiety state 07/24/2012   Chest pain- ER 08/26/12 03/30/2011   Fever 03/28/2011    Leucocytosis 03/28/2011   CKD (chronic kidney disease), stage III ([place]) 03/28/2011   Hyponatremia 02/02/2011   BPH (benign prostatic hyperplasia) 02/01/2011   Cholecystitis chronic, acute 01/26/2011   Dyslipidemia 01/22/2011   Abdominal bloating 08/07/2010   Loose stools 08/07/2010   Coronary atherosclerosis 10/26/2009   Essential hypertension 06/07/2009   DIVERTICULOSIS OF COLON 10/31/2008   DIVERTICULOSIS, COLON, WITH HEMORRHAGE, surg 12/11  10/31/2008   Personal history of colonic polyps 10/31/2008    Willow Ora, PTA, [facility] [address] [place], [place] [address] [phone] 03/27/21, 8:37 PM   Name: Gary [name] MRN: [id number] Date of Birth: 08/08/1932

## 2021-03-29 ENCOUNTER — Ambulatory Visit: Payer: Medicare PPO | Admitting: Physical Therapy

## 2021-03-29 ENCOUNTER — Other Ambulatory Visit: Payer: Self-pay

## 2021-03-29 ENCOUNTER — Encounter: Payer: Self-pay | Admitting: Physical Therapy

## 2021-03-29 DIAGNOSIS — R2681 Unsteadiness on feet: Secondary | ICD-10-CM

## 2021-03-29 DIAGNOSIS — R2689 Other abnormalities of gait and mobility: Secondary | ICD-10-CM

## 2021-03-29 DIAGNOSIS — M6281 Muscle weakness (generalized): Secondary | ICD-10-CM

## 2021-03-29 NOTE — Therapy (Signed)
[facility] [address] [place], [place], [address] Phone: [phone]   Fax:  [phone]  Physical Therapy Treatment  Patient Details  Name: Gary [name] MRN: [id number] Date of Birth: 08/07/1932 Referring Provider (PT): Andrey Spearman   Encounter Date: 03/29/2021   PT End of Session - 03/29/21 1241     Visit Number 6    Number of Visits 17    Date for PT Re-Evaluation 04/27/21    Authorization Type [employer] so 10th visit progress note    Authorization Time Period 17 visits 03/01/21 through 05/03/21    Authorization - Visit Number 6    Authorization - Number of Visits 17    Progress Note Due on Visit 10    PT Start Time 1232    PT Stop Time 1315    PT Time Calculation (min) 43 min    Equipment Utilized During Treatment Gait belt    Activity Tolerance Patient tolerated treatment well    Behavior During Therapy [facility] for tasks assessed/performed             Past Medical History:  Diagnosis Date   Adenomatous colon polyp    2010    Anemia    Anxiety    Arthritis    "hips and lower back"   Arthritis pain    Atypical mole 06/21/2003   left neck, inferior - slight to moderate atypia   Basal cell carcinoma 10/04/2014   right shin bcc (tx cx3, 46fu )   Blood transfusion    Chest pain 03/29/2011   2D Echo without contrast on 03/29/11 - EF= 55-60%.   Cholecystitis    Chronic back pain greater than 3 months duration    Chronic kidney disease    Colon polyps    Complication of anesthesia    once slow to wake up   Coronary angioplasty status 2011   Coronary artery disease    Diarrhea    Diverticulosis    Dyslipidemia    GERD (gastroesophageal reflux disease)    GI bleeding after 07/2009   "triggered by Plavix & ASA; from my diverticulosis"   H/O Clostridium difficile infection    H/O myocardial perfusion scan 04/04/2011   For Pre-noncardiac surgery - EF = 67%, no ischemia, and is considered low  risk.   Heart attack ([place]) 07/2009   nonSTEMI with an occluded circumflex artery and collaterals.   Heart murmur    Hemorrhoids    History of shingles    Hyperlipidemia    Hypertension    Kidney stones    Lower extremity edema    Taking furosemide and it seems to be helping.   Neuromuscular disorder ([place])    Nodular infiltrative basal cell carcinoma (BCC) 02/08/2021   Right Forearm-anterior (tx p bx)   Nonspecific chest pain 08/26/2012   Went to ED 08/26/12   Parkinsonism ([place])    SCCA (squamous cell carcinoma) of skin 02/08/2021   Right Dorsal Hand (well diff) (tx p bx)   Squamous cell carcinoma of skin 05/13/2000   Right outer forehead   Squamous cell carcinoma of skin 03/02/2001   Post crown - tx 03/19/2001   Squamous cell carcinoma of skin 09/01/2001   Left post auricular - tx 10/09/2001   Squamous cell carcinoma of skin 06/21/2003   Top right ear - tx 07/05/2003   Squamous cell carcinoma of skin 06/21/2003   Right upper arm - tx 07/05/2003   Squamous cell carcinoma of skin  10/12/2003   Left superior ear lobe - MOHs   Squamous cell carcinoma of skin 12/15/2006   Right ear, superior - tx 01/26/2007   Squamous cell carcinoma of skin 03/13/2009   Right temple - tx 05/08/2009   Squamous cell carcinoma of skin 03/24/2013   Upper left forearm - tx p bx   Squamous cell carcinoma of skin 12/06/2013   Right forearm - CX3 + 5FU   Squamous cell carcinoma of skin 02/10/2014   Right hand - tx p bx   Squamous cell carcinoma of skin 10/04/2014   Right shin - tx p bx   Squamous cell carcinoma of skin 08/21/2016   Left hand - tx p bx   Squamous cell carcinoma of skin 01/01/2017   Right hand - tx 02/06/2017   Squamous cell carcinoma of skin 10/21/2017   Left side of scalp - tx 03/12/2018   Squamous cell carcinoma of skin 10/21/2017   Left post scalp, inf - tx 08/13/2018   Squamous cell carcinoma of skin 10/21/2017   Right scalp - tx p bx   Squamous cell carcinoma of skin  10/21/2017   Left cheek - tx 03/12/2018   Squamous cell carcinoma of skin 08/13/2018   Left top hand, proximal pinky - tx p bx   Squamous cell carcinoma of skin 12/23/2018   Right bicep lower - tx p bx   Squamous cell carcinoma of skin 05/18/2019   Right 4th finger metacarpophalangeal joint   Squamous cell carcinoma of skin 05/18/2019   Left 5th finger metacarpophalangeal joint   Squamous cell carcinoma of skin 05/18/2019   Left 2nd finger metacarpophalangeal joint   Stomach problems    Syncope and collapse 04/16/2018    Past Surgical History:  Procedure Laterality Date   [place]  08/09/2009   Circumflex 100% occluded with right-to-left collaterals, mild irregularities in the proximal and distal LAD and diagonal system, normal LV systolic function, and minor infrarenal irregularities, but no abdominal aortic aneurysm.   CHOLECYSTECTOMY  04/12/2011   Procedure: CHOLECYSTECTOMY;  Surgeon: Adin Hector, MD;  Location: [place];  Service: General;  Laterality: N/A;   CHOLECYSTECTOMY  04/12/2011   Procedure: LAPAROSCOPIC CHOLECYSTECTOMY;  Surgeon: Adin Hector, MD;  Location: [place];  Service: General;  Laterality: N/A;  converted to open   COLON SURGERY     COLONOSCOPY   ESOPHAGOGASTRODUODENOSCOPY N/A 08/05/2013   Procedure: ESOPHAGOGASTRODUODENOSCOPY (EGD);  Surgeon: Jerene Bears, MD;  Location: [place];  Service: Gastroenterology;  Laterality: N/A;   EYE SURGERY  1942   "right eye was crossed; they  corrected it"   LUMBAR FUSION  07/22/2012   PACEMAKER IMPLANT N/A 04/17/2018   Procedure: PACEMAKER IMPLANT;  Surgeon: Deboraha Sprang, MD;  Location: [place] [facility];  Service: Cardiovascular;  Laterality: N/A;   POSTERIOR FUSION LUMBAR SPINE  08/1995   "bottom 4 vertebra"   SUBTOTAL COLECTOMY  01/24/2010   TOTAL HIP ARTHROPLASTY Right 08/03/2013   Procedure: TOTAL HIP ARTHROPLASTY ANTERIOR APPROACH;  Surgeon: Hessie Dibble, MD;  Location: [place];  Service: Orthopedics;  Laterality: Right;    There were no vitals filed for this visit.   Subjective Assessment - 03/29/21 1237     Subjective Pt reporting his biggest concern is the tremors/shaking that occurs randomly. Has seen Neurologist who ruled out pre Parkinsons, so still unsure of the cause. No falls or pain to report. Does feel the ex's here are  helping him.    Patient is accompained by: Family member   spouse, Lelon Frohlich, in the car   Pertinent History anxiety, arthritis, basal cell carcinoma, back pain, chronic kidney disease, CAD, MI, heart murmur, HTN, lumbar fusion, pacemaker, right THR, mild memory loss    Patient Stated Goals Pt would like to decrease his back and hip pain as well as improve his balance and walking.    Currently in Pain? No/denies    Pain Score 0-No pain                   [facility] Adult PT Treatment/Exercise - 03/29/21 1242       Transfers   Transfers Sit to Stand;Stand to Sit    Sit to Stand 5: Supervision;With upper extremity assist    Stand to Sit 5: Supervision      Ambulation/Gait   Ambulation/Gait Yes    Ambulation/Gait Assistance 4: Min guard    Ambulation/Gait Assistance Details reminder cues on posture and walker position    Ambulation Distance (Feet) 70 Feet   x2 reps   Assistive device Rolling walker    Gait Pattern Step-through pattern;Decreased stride length;Shuffle;Trunk flexed    Ambulation Surface Level;Indoor      High Level Balance   High Level Balance Activities Side stepping;Marching forwards;Backward walking    High Level Balance Comments in parallel bars with UE support: with side stepping cues needed on form/technique/posture for 3 laps toward each side, min guard assist for safety;      Exercises   Exercises Other Exercises    Other Exercises  standing with UE support on bars: with 4 inch box forward step up's with contralaterl march for 10 reps each side. multimodal cues needed for correct technique/ex form.       Knee/Hip Exercises: Aerobic   Nustep bil UE/LE's on level 4.0 x 8 minutes with goal >/= 60 steps per minute for strengthening and activity tolerance.                       PT Short Term Goals - 03/15/21 0905       PT SHORT TERM GOAL #1   Title Pt will be able to perform HEP with supervision of wife for strengthening and balance to continue gains on own.    Time 4    Period Weeks    Status New    Target Date 03/29/21      PT SHORT TERM GOAL #2   Title Pt will decrease 5 x sit to stand from 38.01 sec to <33 sec for improved balance and functional strength.    Baseline 03/01/21 38.01 sec from chair with hands    Time 4    Period Weeks    Status New    Target Date 03/29/21      PT SHORT TERM GOAL #3   Title Pt will ambulate >250' with LRAD on level surfaces mod I for improved mobility in home.    Time 4    Period Weeks    Status New    Target Date 03/29/21      PT SHORT TERM GOAL #4   Title Berg TBD and LTG updated.    Baseline 36/56 and LTG updated by PT    Status Achieved               PT Long Term Goals - 03/15/21 0906       PT LONG TERM GOAL #1   Title  Pt will be report decrease in low back/right hip pain to 6/10 at worst with activities.    Baseline 8-9/10 at worst currently, 0/10 at rest    Time 8    Period Weeks    Status New    Target Date 04/27/21      PT LONG TERM GOAL #2   Title Pt will decrease TUG from 38.78 sec to <32 sec for improved balance and functional mobility.    Baseline 03/01/21 38.78 sec    Time 8    Period Weeks    Status New    Target Date 04/27/21      PT LONG TERM GOAL #3   Title Pt will ambulate >500' on varied level surfaces with LRAD mod I for improved short community distances.    Time 8    Period Weeks    Status New    Target Date 04/27/21      PT LONG TERM GOAL #4   Title Pt will ambulate up/down 1 step with cane to safely enter/exit home.    Time 8    Period Weeks    Status New    Target Date 04/27/21       PT LONG TERM GOAL #5   Title Pt iwll increase gait speed to >0.74m/s for improved household mobility.    Baseline 03/01/21 0.41m/s    Time 8    Period Weeks    Status New    Target Date 04/27/21      PT LONG TERM GOAL #6   Title Pt will increase Berg Balance to >40/56 for improved balance and decreased fall risk.    Baseline PT updated per baseline of 36/56    Time 8    Period Weeks    Status Revised    Target Date 04/27/21                   Plan - 03/29/21 1241     Clinical Impression Statement Today's skilled session continued to focus on gait with walker, strengthening and began to address balance with UE support. Rest breaks needed as pt gets short of breath with activity, recovers quickly. The pt is making progress and should benefit from continued PT to progress toward unmet goals.    Personal Factors and Comorbidities Comorbidity 3+;Age    Comorbidities anxiety, arthritis, basal cell carcinoma, back pain, chronic kidney disease, CAD, MI, heart murmur, HTN, lumbar fusion, pacemaker, right THR, mild memory loss    Examination-Activity Limitations Locomotion Level;Transfers;Stand;Stairs;Squat    Examination-Participation Restrictions Cleaning;Meal Prep;Community Activity    Stability/Clinical Decision Making Evolving/Moderate complexity    Rehab Potential Good    PT Frequency 2x / week   plus eval   PT Duration 8 weeks    PT Treatment/Interventions ADLs/Self Care Home Management;DME Instruction;Cryotherapy;Moist Heat;Gait training;Stair training;Functional mobility training;Therapeutic activities;Therapeutic exercise;Balance training;Neuromuscular re-education;Manual techniques;Passive range of motion;Vestibular;Patient/family education    PT Next Visit Plan continue to focus on  functional strengthening activities like sit to stand, standing balance and gait with walker    PT Home Exercise Plan Access Code: [facility]             Patient will benefit from  skilled therapeutic intervention in order to improve the following deficits and impairments:  Abnormal gait, Cardiopulmonary status limiting activity, Decreased activity tolerance, Decreased balance, Decreased cognition, Decreased mobility, Decreased strength, Decreased knowledge of use of DME, Decreased endurance, Pain  Visit Diagnosis: Other abnormalities of gait and mobility  Muscle weakness (generalized)  Unsteadiness on feet     Problem List Patient Active Problem List   Diagnosis Date Noted   Vitamin B12 deficiency 04/15/2019   Abnormal glucose 04/15/2019   Stage 4 chronic kidney disease ([place]) 04/15/2019   Dyspnea on exertion 04/15/2019   Pulmonary hypertension ([place]) 04/08/2019   Chest pain 03/27/2019   Complete heart block (HCC) 04/17/2018   Syncope and collapse 04/16/2018   Sinus pause 04/16/2018   Snoring 10/03/2017   Other fatigue 10/03/2017   Pre-syncope 05/14/2016   Murmur 05/14/2016   CAD in native artery 10/31/2015   History of MI (myocardial infarction) 10/31/2015   Preoperative cardiovascular examination 12/26/2014   History of colonic polyps 01/17/2014   PAF (paroxysmal atrial fibrillation) (HCC) 92/02/69   Metabolic acidosis 21/97/5883   Acute renal failure ([place]) 08/13/2013   Stage 3b chronic kidney disease ([place]) 08/13/2013   Anemia in chronic kidney disease 08/13/2013   Hypokalemia 08/12/2013   Acute blood loss anemia 08/09/2013   Encephalopathy 08/09/2013   Severe sepsis (HCC) 08/08/2013   Hypotension, unspecified 08/07/2013   Complex renal cyst 08/07/2013   Renal mass, left 08/06/2013   Upper GI bleeding 08/05/2013   Acute esophagitis 08/05/2013   Urinary retention 08/04/2013   S/P total hip arthroplasty 08/03/2013   Degenerative joint disease (DJD) of hip 08/03/2013    Class: Chronic   Internal hemorrhoids with other complication 25/49/8264   Tinnitus 10/22/2012   C. difficile colitis 07/26/2012   Ileus, postoperative ([place]) 07/24/2012    Somnolence 07/24/2012   Anxiety state 07/24/2012   Chest pain- ER 08/26/12 03/30/2011   Fever 03/28/2011   Leucocytosis 03/28/2011   CKD (chronic kidney disease), stage III ([place]) 03/28/2011   Hyponatremia 02/02/2011   BPH (benign prostatic hyperplasia) 02/01/2011   Cholecystitis chronic, acute 01/26/2011   Dyslipidemia 01/22/2011   Abdominal bloating 08/07/2010   Loose stools 08/07/2010   Coronary atherosclerosis 10/26/2009   Essential hypertension 06/07/2009   DIVERTICULOSIS OF COLON 10/31/2008   DIVERTICULOSIS, COLON, WITH HEMORRHAGE, surg 12/11  10/31/2008   Personal history of colonic polyps 10/31/2008   Willow Ora, PTA, [facility] [address], [place], [place] [address] [phone] 03/29/21, 3:16 PM   Name: Gary [name] MRN: [id number] Date of Birth: 1932-06-06

## 2021-03-30 ENCOUNTER — Ambulatory Visit (INDEPENDENT_AMBULATORY_CARE_PROVIDER_SITE_OTHER): Payer: Medicare PPO

## 2021-03-30 DIAGNOSIS — I442 Atrioventricular block, complete: Secondary | ICD-10-CM

## 2021-03-30 LAB — CUP PACEART REMOTE DEVICE CHECK
Battery Remaining Longevity: 111 mo
Battery Voltage: 3 V
Brady Statistic AP VP Percent: 36.95 %
Brady Statistic AP VS Percent: 0.01 %
Brady Statistic AS VP Percent: 62.96 %
Brady Statistic AS VS Percent: 0.08 %
Brady Statistic RA Percent Paced: 36.79 %
Brady Statistic RV Percent Paced: 99.91 %
Date Time Interrogation Session: 20230216184252
Implantable Lead Implant Date: 20200306
Implantable Lead Implant Date: 20200306
Implantable Lead Location: 753859
Implantable Lead Location: 753860
Implantable Lead Model: 5076
Implantable Lead Model: 5076
Implantable Pulse Generator Implant Date: 20200306
Lead Channel Impedance Value: 323 Ohm
Lead Channel Impedance Value: 361 Ohm
Lead Channel Impedance Value: 361 Ohm
Lead Channel Impedance Value: 475 Ohm
Lead Channel Pacing Threshold Amplitude: 0.5 V
Lead Channel Pacing Threshold Amplitude: 0.5 V
Lead Channel Pacing Threshold Pulse Width: 0.4 ms
Lead Channel Pacing Threshold Pulse Width: 0.4 ms
Lead Channel Sensing Intrinsic Amplitude: 1 mV
Lead Channel Sensing Intrinsic Amplitude: 1 mV
Lead Channel Sensing Intrinsic Amplitude: 9.875 mV
Lead Channel Sensing Intrinsic Amplitude: 9.875 mV
Lead Channel Setting Pacing Amplitude: 1.5 V
Lead Channel Setting Pacing Amplitude: 2.5 V
Lead Channel Setting Pacing Pulse Width: 0.4 ms
Lead Channel Setting Sensing Sensitivity: 0.6 mV

## 2021-04-03 ENCOUNTER — Ambulatory Visit: Payer: Medicare PPO | Admitting: Physical Therapy

## 2021-04-03 ENCOUNTER — Encounter: Payer: Self-pay | Admitting: Physical Therapy

## 2021-04-03 ENCOUNTER — Other Ambulatory Visit: Payer: Self-pay

## 2021-04-03 DIAGNOSIS — M6281 Muscle weakness (generalized): Secondary | ICD-10-CM

## 2021-04-03 DIAGNOSIS — R2681 Unsteadiness on feet: Secondary | ICD-10-CM

## 2021-04-03 DIAGNOSIS — R2689 Other abnormalities of gait and mobility: Secondary | ICD-10-CM | POA: Diagnosis not present

## 2021-04-04 NOTE — Therapy (Signed)
[facility] [address] [place], [place], [address] Phone: [phone]   Fax:  [phone]  Physical Therapy Treatment  Patient Details  Name: Gary [name] MRN: [id number] Date of Birth: 04-14-1932 Referring Provider (PT): Andrey Spearman   Encounter Date: 04/03/2021   PT End of Session - 04/03/21 1239     Visit Number 7    Number of Visits 17    Date for PT Re-Evaluation 04/27/21    Authorization Type [employer] so 10th visit progress note    Authorization Time Period 17 visits 03/01/21 through 05/03/21    Authorization - Visit Number 7    Authorization - Number of Visits 17    Progress Note Due on Visit 10    PT Start Time 1234    PT Stop Time 1314    PT Time Calculation (min) 40 min    Equipment Utilized During Treatment Gait belt    Activity Tolerance Patient tolerated treatment well    Behavior During Therapy [facility] for tasks assessed/performed             Past Medical History:  Diagnosis Date   Adenomatous colon polyp    2010    Anemia    Anxiety    Arthritis    "hips and lower back"   Arthritis pain    Atypical mole 06/21/2003   left neck, inferior - slight to moderate atypia   Basal cell carcinoma 10/04/2014   right shin bcc (tx cx3, 47f )   Blood transfusion    Chest pain 03/29/2011   2D Echo without contrast on 03/29/11 - EF= 55-60%.   Cholecystitis    Chronic back pain greater than 3 months duration    Chronic kidney disease    Colon polyps    Complication of anesthesia    once slow to wake up   Coronary angioplasty status 2011   Coronary artery disease    Diarrhea    Diverticulosis    Dyslipidemia    GERD (gastroesophageal reflux disease)    GI bleeding after 07/2009   "triggered by Plavix & ASA; from my diverticulosis"   H/O Clostridium difficile infection    H/O myocardial perfusion scan 04/04/2011   For Pre-noncardiac surgery - EF = 67%, no ischemia, and is considered low  risk.   Heart attack ([place] 07/2009   nonSTEMI with an occluded circumflex artery and collaterals.   Heart murmur    Hemorrhoids    History of shingles    Hyperlipidemia    Hypertension    Kidney stones    Lower extremity edema    Taking furosemide and it seems to be helping.   Neuromuscular disorder ([facility] [place]    Nodular infiltrative basal cell carcinoma (BCC) 02/08/2021   Right Forearm-anterior (tx p bx)   Nonspecific chest pain 08/26/2012   Went to ED 08/26/12   Parkinsonism ([place]    SCCA (squamous cell carcinoma) of skin 02/08/2021   Right Dorsal Hand (well diff) (tx p bx)   Squamous cell carcinoma of skin 05/13/2000   Right outer forehead   Squamous cell carcinoma of skin 03/02/2001   Post crown - tx 03/19/2001   Squamous cell carcinoma of skin 09/01/2001   Left post auricular - tx 10/09/2001   Squamous cell carcinoma of skin 06/21/2003   Top right ear - tx 07/05/2003   Squamous cell carcinoma of skin 06/21/2003   Right upper arm - tx 07/05/2003   Squamous cell carcinoma of skin  10/12/2003   Left superior ear lobe - MOHs   Squamous cell carcinoma of skin 12/15/2006   Right ear, superior - tx 01/26/2007   Squamous cell carcinoma of skin 03/13/2009   Right temple - tx 05/08/2009   Squamous cell carcinoma of skin 03/24/2013   Upper left forearm - tx p bx   Squamous cell carcinoma of skin 12/06/2013   Right forearm - CX3 + 5FU   Squamous cell carcinoma of skin 02/10/2014   Right hand - tx p bx   Squamous cell carcinoma of skin 10/04/2014   Right shin - tx p bx   Squamous cell carcinoma of skin 08/21/2016   Left hand - tx p bx   Squamous cell carcinoma of skin 01/01/2017   Right hand - tx 02/06/2017   Squamous cell carcinoma of skin 10/21/2017   Left side of scalp - tx 03/12/2018   Squamous cell carcinoma of skin 10/21/2017   Left post scalp, inf - tx 08/13/2018   Squamous cell carcinoma of skin 10/21/2017   Right scalp - tx p bx   Squamous cell carcinoma of skin  10/21/2017   Left cheek - tx 03/12/2018   Squamous cell carcinoma of skin 08/13/2018   Left top hand, proximal pinky - tx p bx   Squamous cell carcinoma of skin 12/23/2018   Right bicep lower - tx p bx   Squamous cell carcinoma of skin 05/18/2019   Right 4th finger metacarpophalangeal joint   Squamous cell carcinoma of skin 05/18/2019   Left 5th finger metacarpophalangeal joint   Squamous cell carcinoma of skin 05/18/2019   Left 2nd finger metacarpophalangeal joint   Stomach problems    Syncope and collapse 04/16/2018    Past Surgical History:  Procedure Laterality Date   [place]  08/09/2009   Circumflex 100% occluded with right-to-left collaterals, mild irregularities in the proximal and distal LAD and diagonal system, normal LV systolic function, and minor infrarenal irregularities, but no abdominal aortic aneurysm.   CHOLECYSTECTOMY  04/12/2011   Procedure: CHOLECYSTECTOMY;  Surgeon: Adin Hector, MD;  Location: [facility];  Service: General;  Laterality: N/A;   CHOLECYSTECTOMY  04/12/2011   Procedure: LAPAROSCOPIC CHOLECYSTECTOMY;  Surgeon: Adin Hector, MD;  Location: [place];  Service: General;  Laterality: N/A;  converted to open   COLON SURGERY     COLONOSCOPY   ESOPHAGOGASTRODUODENOSCOPY N/A 08/05/2013   Procedure: ESOPHAGOGASTRODUODENOSCOPY (EGD);  Surgeon: Jerene Bears, MD;  Location: [facility];  Service: Gastroenterology;  Laterality: N/A;   EYE SURGERY  1942   "right eye was crossed; they  corrected it"   LUMBAR FUSION  07/22/2012   PACEMAKER IMPLANT N/A 04/17/2018   Procedure: PACEMAKER IMPLANT;  Surgeon: Deboraha Sprang, MD;  Location: [facility];  Service: Cardiovascular;  Laterality: N/A;   POSTERIOR FUSION LUMBAR SPINE  08/1995   "bottom 4 vertebra"   SUBTOTAL COLECTOMY  01/24/2010   TOTAL HIP ARTHROPLASTY Right 08/03/2013   Procedure: TOTAL HIP ARTHROPLASTY ANTERIOR APPROACH;  Surgeon: Hessie Dibble, MD;  Location: [facility];  Service: Orthopedics;  Laterality: Right;    There were no vitals filed for this visit.   Subjective Assessment - 04/03/21 1238     Subjective No new complaints. No falls or pain to report.    Patient is accompained by: Family member   Spouse Ann in the car   Pertinent History anxiety, arthritis, basal cell carcinoma, back pain, chronic  kidney disease, CAD, MI, heart murmur, HTN, lumbar fusion, pacemaker, right THR, mild memory loss    Patient Stated Goals Pt would like to decrease his back and hip pain as well as improve his balance and walking.    Currently in Pain? No/denies    Pain Score 0-No pain                    [facility] Adult PT Treatment/Exercise - 04/03/21 1240       Transfers   Transfers Sit to Stand;Stand to Sit    Sit to Stand 5: Supervision;With upper extremity assist    Five time sit to stand comments  15.82 sec's from standard height chair with UE assistance    Stand to Sit 5: Supervision      Ambulation/Gait   Ambulation/Gait Yes    Ambulation/Gait Assistance 4: Min guard    Ambulation/Gait Assistance Details remninder cues on posture and walker position.    Ambulation Distance (Feet) 250 Feet   x1, 100 x2, plus short distances between activities in session   Assistive device Rolling walker    Gait Pattern Step-through pattern;Decreased stride length;Shuffle;Trunk flexed    Ambulation Surface Level;Indoor      Exercises   Exercises Other Exercises    Other Exercises  reports no issues with current HEP. Performing consistently at home. Has changed the seated hamstring stretch to runner strech at counter as he feels it more this way.      Knee/Hip Exercises: Aerobic   Nustep bil UE/LE's on level 4.0 x 8 minutes with goal >/= 60 steps per minute for strengthening and activity tolerance.                       PT Short Term Goals - 04/03/21 1630       PT SHORT TERM GOAL #1   Title Pt will be able to perform HEP with supervision of  wife for strengthening and balance to continue gains on own.    Baseline 04/03/21: met with current HEP    Status Achieved      PT SHORT TERM GOAL #2   Title Pt will decrease 5 x sit to stand from 38.01 sec to <33 sec for improved balance and functional strength.    Baseline 04/03/21: 15.82 sec's from standard height surface with UE assistance    Status Achieved      PT SHORT TERM GOAL #3   Title Pt will ambulate >250' with LRAD on level surfaces mod I for improved mobility in home.    Baseline 04/04/19: met distance with superivsion    Status Partially Met      PT SHORT TERM GOAL #4   Title Berg TBD and LTG updated.    Baseline 36/56 and LTG updated by PT    Status Achieved               PT Long Term Goals - 03/15/21 0906       PT LONG TERM GOAL #1   Title Pt will be report decrease in low back/right hip pain to 6/10 at worst with activities.    Baseline 8-9/10 at worst currently, 0/10 at rest    Time 8    Period Weeks    Status New    Target Date 04/27/21      PT LONG TERM GOAL #2   Title Pt will decrease TUG from 38.78 sec to <32 sec for improved balance and functional mobility.  Baseline 03/01/21 38.78 sec    Time 8    Period Weeks    Status New    Target Date 04/27/21      PT LONG TERM GOAL #3   Title Pt will ambulate >500' on varied level surfaces with LRAD mod I for improved short community distances.    Time 8    Period Weeks    Status New    Target Date 04/27/21      PT LONG TERM GOAL #4   Title Pt will ambulate up/down 1 step with cane to safely enter/exit home.    Time 8    Period Weeks    Status New    Target Date 04/27/21      PT LONG TERM GOAL #5   Title Pt iwll increase gait speed to >0.38ms for improved household mobility.    Baseline 03/01/21 0.450m    Time 8    Period Weeks    Status New    Target Date 04/27/21      PT LONG TERM GOAL #6   Title Pt will increase Berg Balance to >40/56 for improved balance and decreased fall risk.     Baseline PT updated per baseline of 36/56    Time 8    Period Weeks    Status Revised    Target Date 04/27/21                   Plan - 04/03/21 1239     Clinical Impression Statement Today's skilled session continued to focus on activity tolerance and strengthening with short rest breaks needed due to fatigue/shortness of breath with actiivity. Also addressed progress toward STGs with goals partially to fully met. The pt is making progress and should benefit from continued PT to progress toward unmet goals.    Personal Factors and Comorbidities Comorbidity 3+;Age    Comorbidities anxiety, arthritis, basal cell carcinoma, back pain, chronic kidney disease, CAD, MI, heart murmur, HTN, lumbar fusion, pacemaker, right THR, mild memory loss    Examination-Activity Limitations Locomotion Level;Transfers;Stand;Stairs;Squat    Examination-Participation Restrictions Cleaning;Meal Prep;Community Activity    Stability/Clinical Decision Making Evolving/Moderate complexity    Rehab Potential Good    PT Frequency 2x / week   plus eval   PT Duration 8 weeks    PT Treatment/Interventions ADLs/Self Care Home Management;DME Instruction;Cryotherapy;Moist Heat;Gait training;Stair training;Functional mobility training;Therapeutic activities;Therapeutic exercise;Balance training;Neuromuscular re-education;Manual techniques;Passive range of motion;Vestibular;Patient/family education    PT Next Visit Plan continue to focus on  functional strengthening activities like sit to stand, standing balance and gait with walker    PT Home Exercise Plan [facility]: [facility]           Patient will benefit from skilled therapeutic intervention in order to improve the following deficits and impairments:  Abnormal gait, Cardiopulmonary status limiting activity, Decreased activity tolerance, Decreased balance, Decreased cognition, Decreased mobility, Decreased strength, Decreased knowledge of use of DME, Decreased  endurance, Pain  Visit Diagnosis: Other abnormalities of gait and mobility  Muscle weakness (generalized)  Unsteadiness on feet     Problem List Patient Active Problem List   Diagnosis Date Noted   Vitamin B12 deficiency 04/15/2019   Abnormal glucose 04/15/2019   Stage 4 chronic kidney disease (HC[facility]. [facility]03/05/2019   Dyspnea on exertion 04/15/2019   Pulmonary hypertension (HC[facility]02/25/2021   Chest pain 03/27/2019   Complete heart block (HCR[place]03/07/2018   Syncope and collapse 04/16/2018   Sinus pause 04/16/2018   Snoring 10/03/2017  Other fatigue 10/03/2017   Pre-syncope 05/14/2016   Murmur 05/14/2016   CAD in native artery 10/31/2015   History of MI (myocardial infarction) 10/31/2015   Preoperative cardiovascular examination 12/26/2014   History of colonic polyps 01/17/2014   PAF (paroxysmal atrial fibrillation) (HCC) 85/90/9311   Metabolic acidosis 21/62/4469   Acute renal failure ([place]) 08/13/2013   Stage 3b chronic kidney disease ([place]) 08/13/2013   Anemia in chronic kidney disease 08/13/2013   Hypokalemia 08/12/2013   Acute blood loss anemia 08/09/2013   Encephalopathy 08/09/2013   Severe sepsis ([place]) 08/08/2013   Hypotension, unspecified 08/07/2013   Complex renal cyst 08/07/2013   Renal mass, left 08/06/2013   Upper GI bleeding 08/05/2013   Acute esophagitis 08/05/2013   Urinary retention 08/04/2013   S/P total hip arthroplasty 08/03/2013   Degenerative joint disease (DJD) of hip 08/03/2013    Class: Chronic   Internal hemorrhoids with other complication 50/72/2575   Tinnitus 10/22/2012   C. difficile colitis 07/26/2012   Ileus, postoperative ([place]) 07/24/2012   Somnolence 07/24/2012   Anxiety state 07/24/2012   Chest pain- ER 08/26/12 03/30/2011   Fever 03/28/2011   Leucocytosis 03/28/2011   CKD (chronic kidney disease), stage III ([place]) 03/28/2011   Hyponatremia 02/02/2011   BPH (benign prostatic hyperplasia) 02/01/2011   Cholecystitis chronic, acute  01/26/2011   Dyslipidemia 01/22/2011   Abdominal bloating 08/07/2010   Loose stools 08/07/2010   Coronary atherosclerosis 10/26/2009   Essential hypertension 06/07/2009   DIVERTICULOSIS OF COLON 10/31/2008   DIVERTICULOSIS, COLON, WITH HEMORRHAGE, surg 12/11  10/31/2008   Personal history of colonic polyps 10/31/2008    Willow Ora, PTA, [facility] Outpatient Neuro [facility] [address] [place], [place] [address] [phone] 04/04/21, 10:48 AM   Name: APOSTOLOS BLAGG MRN: [id number] Date of Birth: 20-Jan-1933

## 2021-04-05 ENCOUNTER — Ambulatory Visit: Payer: Medicare PPO

## 2021-04-05 NOTE — Progress Notes (Signed)
Remote pacemaker transmission.   

## 2021-04-10 ENCOUNTER — Other Ambulatory Visit: Payer: Self-pay

## 2021-04-10 ENCOUNTER — Ambulatory Visit: Payer: Medicare PPO | Admitting: Physical Therapy

## 2021-04-10 ENCOUNTER — Encounter: Payer: Self-pay | Admitting: Physical Therapy

## 2021-04-10 DIAGNOSIS — R2681 Unsteadiness on feet: Secondary | ICD-10-CM

## 2021-04-10 DIAGNOSIS — R2689 Other abnormalities of gait and mobility: Secondary | ICD-10-CM | POA: Diagnosis not present

## 2021-04-10 DIAGNOSIS — M6281 Muscle weakness (generalized): Secondary | ICD-10-CM

## 2021-04-10 NOTE — Therapy (Signed)
[facility] [address] [place], [place], [address] Phone: [phone]   Fax:  [phone]  Physical Therapy Treatment  Patient Details  Name: Gary [name] MRN: [id number] Date of Birth: [birth year] Referring Provider (PT): Andrey Spearman   Encounter Date: 04/10/2021   PT End of Session - 04/10/21 1330     Visit Number 8    Number of Visits 17    Date for PT Re-Evaluation 04/27/21    Authorization Type [employer] so 10th visit progress note    Authorization Time Period 17 visits 03/01/21 through 05/03/21    Authorization - Visit Number 8    Authorization - Number of Visits 17    Progress Note Due on Visit 10    PT Start Time 1316    PT Stop Time 1400    PT Time Calculation (min) 44 min    Equipment Utilized During Treatment Gait belt    Activity Tolerance Patient tolerated treatment well    Behavior During Therapy [facility] for tasks assessed/performed             Past Medical History:  Diagnosis Date   Adenomatous colon polyp    2010    Anemia    Anxiety    Arthritis    "hips and lower back"   Arthritis pain    Atypical mole 06/21/2003   left neck, inferior - slight to moderate atypia   Basal cell carcinoma 10/04/2014   right shin bcc (tx cx3, 91fu )   Blood transfusion    Chest pain 03/29/2011   2D Echo without contrast on 03/29/11 - EF= 55-60%.   Cholecystitis    Chronic back pain greater than 3 months duration    Chronic kidney disease    Colon polyps    Complication of anesthesia    once slow to wake up   Coronary angioplasty status 2011   Coronary artery disease    Diarrhea    Diverticulosis    Dyslipidemia    GERD (gastroesophageal reflux disease)    GI bleeding after 07/2009   "triggered by Plavix & ASA; from my diverticulosis"   H/O Clostridium difficile infection    H/O myocardial perfusion scan 04/04/2011   For Pre-noncardiac surgery - EF = 67%, no ischemia, and is considered low  risk.   Heart attack ([place]) 07/2009   nonSTEMI with an occluded circumflex artery and collaterals.   Heart murmur    Hemorrhoids    History of shingles    Hyperlipidemia    Hypertension    Kidney stones    Lower extremity edema    Taking furosemide and it seems to be helping.   Neuromuscular disorder ([place])    Nodular infiltrative basal cell carcinoma (BCC) 02/08/2021   Right Forearm-anterior (tx p bx)   Nonspecific chest pain 08/26/2012   Went to ED 08/26/12   Parkinsonism ([place])    SCCA (squamous cell carcinoma) of skin 02/08/2021   Right Dorsal Hand (well diff) (tx p bx)   Squamous cell carcinoma of skin 05/13/2000   Right outer forehead   Squamous cell carcinoma of skin 03/02/2001   Post crown - tx 03/19/2001   Squamous cell carcinoma of skin 09/01/2001   Left post auricular - tx 10/09/2001   Squamous cell carcinoma of skin 06/21/2003   Top right ear - tx 07/05/2003   Squamous cell carcinoma of skin 06/21/2003   Right upper arm - tx 07/05/2003   Squamous cell carcinoma of skin  10/12/2003   Left superior ear lobe - MOHs   Squamous cell carcinoma of skin 12/15/2006   Right ear, superior - tx 01/26/2007   Squamous cell carcinoma of skin 03/13/2009   Right temple - tx 05/08/2009   Squamous cell carcinoma of skin 03/24/2013   Upper left forearm - tx p bx   Squamous cell carcinoma of skin 12/06/2013   Right forearm - CX3 + 5FU   Squamous cell carcinoma of skin 02/10/2014   Right hand - tx p bx   Squamous cell carcinoma of skin 10/04/2014   Right shin - tx p bx   Squamous cell carcinoma of skin 08/21/2016   Left hand - tx p bx   Squamous cell carcinoma of skin 01/01/2017   Right hand - tx 02/06/2017   Squamous cell carcinoma of skin 10/21/2017   Left side of scalp - tx 03/12/2018   Squamous cell carcinoma of skin 10/21/2017   Left post scalp, inf - tx 08/13/2018   Squamous cell carcinoma of skin 10/21/2017   Right scalp - tx p bx   Squamous cell carcinoma of skin  10/21/2017   Left cheek - tx 03/12/2018   Squamous cell carcinoma of skin 08/13/2018   Left top hand, proximal pinky - tx p bx   Squamous cell carcinoma of skin 12/23/2018   Right bicep lower - tx p bx   Squamous cell carcinoma of skin 05/18/2019   Right 4th finger metacarpophalangeal joint   Squamous cell carcinoma of skin 05/18/2019   Left 5th finger metacarpophalangeal joint   Squamous cell carcinoma of skin 05/18/2019   Left 2nd finger metacarpophalangeal joint   Stomach problems    Syncope and collapse 04/16/2018    Past Surgical History:  Procedure Laterality Date   [place]  08/09/2009   Circumflex 100% occluded with right-to-left collaterals, mild irregularities in the proximal and distal LAD and diagonal system, normal LV systolic function, and minor infrarenal irregularities, but no abdominal aortic aneurysm.   CHOLECYSTECTOMY  04/12/2011   Procedure: CHOLECYSTECTOMY;  Surgeon: Adin Hector, MD;  Location: [facility];  Service: General;  Laterality: N/A;   CHOLECYSTECTOMY  04/12/2011   Procedure: LAPAROSCOPIC CHOLECYSTECTOMY;  Surgeon: Adin Hector, MD;  Location: [facility];  Service: General;  Laterality: N/A;  converted to open   COLON SURGERY     COLONOSCOPY   ESOPHAGOGASTRODUODENOSCOPY N/A 08/05/2013   Procedure: ESOPHAGOGASTRODUODENOSCOPY (EGD);  Surgeon: Jerene Bears, MD;  Location: [facility];  Service: Gastroenterology;  Laterality: N/A;   EYE SURGERY  1942   "right eye was crossed; they  corrected it"   LUMBAR FUSION  07/22/2012   PACEMAKER IMPLANT N/A 04/17/2018   Procedure: PACEMAKER IMPLANT;  Surgeon: Deboraha Sprang, MD;  Location: [facility];  Service: Cardiovascular;  Laterality: N/A;   POSTERIOR FUSION LUMBAR SPINE  08/1995   "bottom 4 vertebra"   SUBTOTAL COLECTOMY  01/24/2010   TOTAL HIP ARTHROPLASTY Right 08/03/2013   Procedure: TOTAL HIP ARTHROPLASTY ANTERIOR APPROACH;  Surgeon: Hessie Dibble, MD;  Location: [facility];  Service: Orthopedics;  Laterality: Right;    There were no vitals filed for this visit.   Subjective Assessment - 04/10/21 1320     Subjective Pt reports wife is going through a lot of medical issues currently which is affecting him.  The shower in their home is being remodeled and is scheduled to be finished today to include shower bench and  3 grab bars with decreased edge height.  He reports he is currently trying to get hearing aids.   He reports feeling overwhelmed with appts and things at home.    Patient is accompained by: Family member   Spouse Ann in the car   Pertinent History anxiety, arthritis, basal cell carcinoma, back pain, chronic kidney disease, CAD, MI, heart murmur, HTN, lumbar fusion, pacemaker, right THR, mild memory loss    Patient Stated Goals Pt would like to decrease his back and hip pain as well as improve his balance and walking.    Currently in Pain? No/denies                               [facility] Adult PT Treatment/Exercise - 04/10/21 1340       Ambulation/Gait   Ambulation/Gait Yes    Ambulation/Gait Assistance 4: Min guard    Ambulation/Gait Assistance Details cues for RW management several bouts in gym    Ambulation Distance (Feet) 100 Feet   combined bouts to and from equipment and around gym   Assistive device Rolling walker    Gait Pattern Step-through pattern;Decreased step length - right    Ambulation Surface Level;Indoor      Neuro Re-ed    Neuro Re-ed Details  Pt uses RW and CGA to perform several bouts of obstacle navigation around cones to promote dynamic balance with turning.  Pt demos decreased RW management with farther proximity from walker on R and with R turns.  Verbally cued for proximity to RW with min correction.  Standing at [facility] with UE support on RW performing 2x10 taps to therastone then cone and back to midline.      Knee/Hip Exercises: Aerobic   Nustep 8 mins, L4 using bilateral UE/LEs continuously, cued for  LE engagement, required minA for LE placement on pedals.  67 Avg steps per min.                       PT Short Term Goals - 04/03/21 1630       PT SHORT TERM GOAL #1   Title Pt will be able to perform HEP with supervision of wife for strengthening and balance to continue gains on own.    Baseline 04/03/21: met with current HEP    Status Achieved      PT SHORT TERM GOAL #2   Title Pt will decrease 5 x sit to stand from 38.01 sec to <33 sec for improved balance and functional strength.    Baseline 04/03/21: 15.82 sec's from standard height surface with UE assistance    Status Achieved      PT SHORT TERM GOAL #3   Title Pt will ambulate >250' with LRAD on level surfaces mod I for improved mobility in home.    Baseline 04/04/19: met distance with superivsion    Status Partially Met      PT SHORT TERM GOAL #4   Title Berg TBD and LTG updated.    Baseline 36/56 and LTG updated by PT    Status Achieved               PT Long Term Goals - 03/15/21 0906       PT LONG TERM GOAL #1   Title Pt will be report decrease in low back/right hip pain to 6/10 at worst with activities.    Baseline 8-9/10 at worst currently, 0/10 at  rest    Time 8    Period Weeks    Status New    Target Date 04/27/21      PT LONG TERM GOAL #2   Title Pt will decrease TUG from 38.78 sec to <32 sec for improved balance and functional mobility.    Baseline 03/01/21 38.78 sec    Time 8    Period Weeks    Status New    Target Date 04/27/21      PT LONG TERM GOAL #3   Title Pt will ambulate >500' on varied level surfaces with LRAD mod I for improved short community distances.    Time 8    Period Weeks    Status New    Target Date 04/27/21      PT LONG TERM GOAL #4   Title Pt will ambulate up/down 1 step with cane to safely enter/exit home.    Time 8    Period Weeks    Status New    Target Date 04/27/21      PT LONG TERM GOAL #5   Title Pt iwll increase gait speed to >0.86m/s for  improved household mobility.    Baseline 03/01/21 0.68m/s    Time 8    Period Weeks    Status New    Target Date 04/27/21      PT LONG TERM GOAL #6   Title Pt will increase Berg Balance to >40/56 for improved balance and decreased fall risk.    Baseline PT updated per baseline of 36/56    Time 8    Period Weeks    Status Revised    Target Date 04/27/21                   Plan - 04/10/21 1545     Clinical Impression Statement Pt provided therapeutic listening at onset of session due to increased lability of patient with reports of feeling too overwhelmed to immediately initiate exercise.  Focus of session was balance with RW management particularly when turning.  Pt remains limited by fatigue requiring slower pace of exercise with increased rest breaks.  Pt would continue to benefit from balance and strengthening as able.    Personal Factors and Comorbidities Comorbidity 3+;Age    Comorbidities anxiety, arthritis, basal cell carcinoma, back pain, chronic kidney disease, CAD, MI, heart murmur, HTN, lumbar fusion, pacemaker, right THR, mild memory loss    Examination-Activity Limitations Locomotion Level;Transfers;Stand;Stairs;Squat    Examination-Participation Restrictions Cleaning;Meal Prep;Community Activity    Stability/Clinical Decision Making Evolving/Moderate complexity    Rehab Potential Good    PT Frequency 2x / week   plus eval   PT Duration 8 weeks    PT Treatment/Interventions ADLs/Self Care Home Management;DME Instruction;Cryotherapy;Moist Heat;Gait training;Stair training;Functional mobility training;Therapeutic activities;Therapeutic exercise;Balance training;Neuromuscular re-education;Manual techniques;Passive range of motion;Vestibular;Patient/family education    PT Next Visit Plan Trial biofeedback type cue for maintaining proximity to RW like use of theraband.  continue to focus on  functional strengthening activities like sit to stand, standing balance and gait  with walker    PT Home Exercise Plan Access Code: [facility]             Patient will benefit from skilled therapeutic intervention in order to improve the following deficits and impairments:  Abnormal gait, Cardiopulmonary status limiting activity, Decreased activity tolerance, Decreased balance, Decreased cognition, Decreased mobility, Decreased strength, Decreased knowledge of use of DME, Decreased endurance, Pain  Visit Diagnosis: Other abnormalities of gait and mobility  Muscle weakness (generalized)  Unsteadiness on feet     Problem List Patient Active Problem List   Diagnosis Date Noted   Vitamin B12 deficiency 04/15/2019   Abnormal glucose 04/15/2019   Stage 4 chronic kidney disease ([place]) 04/15/2019   Dyspnea on exertion 04/15/2019   Pulmonary hypertension ([place]) 04/08/2019   Chest pain 03/27/2019   Complete heart block (HCC) 04/17/2018   Syncope and collapse 04/16/2018   Sinus pause 04/16/2018   Snoring 10/03/2017   Other fatigue 10/03/2017   Pre-syncope 05/14/2016   Murmur 05/14/2016   CAD in native artery 10/31/2015   History of MI (myocardial infarction) 10/31/2015   Preoperative cardiovascular examination 12/26/2014   History of colonic polyps 01/17/2014   PAF (paroxysmal atrial fibrillation) (HCC) 78/58/8502   Metabolic acidosis 77/41/2878   Acute renal failure ([place]) 08/13/2013   Stage 3b chronic kidney disease ([place]) 08/13/2013   Anemia in chronic kidney disease 08/13/2013   Hypokalemia 08/12/2013   Acute blood loss anemia 08/09/2013   Encephalopathy 08/09/2013   Severe sepsis (HCC) 08/08/2013   Hypotension, unspecified 08/07/2013   Complex renal cyst 08/07/2013   Renal mass, left 08/06/2013   Upper GI bleeding 08/05/2013   Acute esophagitis 08/05/2013   Urinary retention 08/04/2013   S/P total hip arthroplasty 08/03/2013   Degenerative joint disease (DJD) of hip 08/03/2013    Class: Chronic   Internal hemorrhoids with other complication  67/67/2094   Tinnitus 10/22/2012   C. difficile colitis 07/26/2012   Ileus, postoperative ([place]) 07/24/2012   Somnolence 07/24/2012   Anxiety state 07/24/2012   Chest pain- ER 08/26/12 03/30/2011   Fever 03/28/2011   Leucocytosis 03/28/2011   CKD (chronic kidney disease), stage III ([place]) 03/28/2011   Hyponatremia 02/02/2011   BPH (benign prostatic hyperplasia) 02/01/2011   Cholecystitis chronic, acute 01/26/2011   Dyslipidemia 01/22/2011   Abdominal bloating 08/07/2010   Loose stools 08/07/2010   Coronary atherosclerosis 10/26/2009   Essential hypertension 06/07/2009   DIVERTICULOSIS OF COLON 10/31/2008   DIVERTICULOSIS, COLON, WITH HEMORRHAGE, surg 12/11  10/31/2008   Personal history of colonic polyps 10/31/2008    Bary Richard, PT, DPT 04/10/2021, 4:28 PM  [facility] [address] [place], [place], [address] Phone: [phone]   Fax:  [phone]  Name: Gary [name] MRN: [id number] Date of Birth: 1932/05/17

## 2021-04-12 ENCOUNTER — Ambulatory Visit: Payer: Medicare PPO

## 2021-04-17 ENCOUNTER — Ambulatory Visit: Payer: Medicare PPO | Admitting: Physical Therapy

## 2021-04-18 ENCOUNTER — Telehealth: Payer: Self-pay

## 2021-04-18 NOTE — Telephone Encounter (Signed)
PT returned call for pt's wife, Mardene Celeste. She asked that we hold on his therapy until April as he has many MD appointments right now. PT will leave chart open and they will need to call to get back on schedule with PT when ready to return. Wife verbalized understanding.  ?Cherly Anderson, PT, DPT, NCS ? ?

## 2021-04-20 ENCOUNTER — Ambulatory Visit: Payer: Medicare PPO | Admitting: Physical Therapy

## 2021-04-24 ENCOUNTER — Ambulatory Visit: Payer: Medicare PPO | Admitting: Physical Therapy

## 2021-04-26 ENCOUNTER — Ambulatory Visit: Payer: Medicare PPO | Admitting: Physical Therapy

## 2021-05-01 ENCOUNTER — Ambulatory Visit: Payer: Medicare PPO | Admitting: Physical Therapy

## 2021-05-03 ENCOUNTER — Ambulatory Visit: Payer: Medicare PPO | Admitting: Physical Therapy

## 2021-05-07 ENCOUNTER — Other Ambulatory Visit: Payer: Self-pay

## 2021-05-07 ENCOUNTER — Ambulatory Visit (INDEPENDENT_AMBULATORY_CARE_PROVIDER_SITE_OTHER): Payer: Medicare PPO | Admitting: Dermatology

## 2021-05-07 ENCOUNTER — Encounter: Payer: Self-pay | Admitting: Dermatology

## 2021-05-07 DIAGNOSIS — L570 Actinic keratosis: Secondary | ICD-10-CM

## 2021-05-07 DIAGNOSIS — Z8589 Personal history of malignant neoplasm of other organs and systems: Secondary | ICD-10-CM

## 2021-05-07 DIAGNOSIS — L853 Xerosis cutis: Secondary | ICD-10-CM | POA: Diagnosis not present

## 2021-05-07 DIAGNOSIS — Z85828 Personal history of other malignant neoplasm of skin: Secondary | ICD-10-CM | POA: Diagnosis not present

## 2021-05-07 NOTE — Patient Instructions (Addendum)
Pick up over the counter Cerave Itch relief  ?

## 2021-05-08 ENCOUNTER — Ambulatory Visit: Payer: Medicare PPO

## 2021-05-10 ENCOUNTER — Ambulatory Visit: Payer: Medicare PPO

## 2021-05-27 ENCOUNTER — Encounter: Payer: Self-pay | Admitting: Dermatology

## 2021-05-27 NOTE — Progress Notes (Signed)
? ?  Follow-Up Visit ?  ?Subjective  ?Gary [name] is a 88 y.o. male who presents for the following: Follow-up (Patient here today for 3 month follow up. Per patient he has a lesion on his left cheek that scales up x few months per patient there has been some bleeding. Patient also wants to know what he can do for is dry and itchy skin. ). ? ?Check surgical site ear, other crusts on face, dry itchy skin all over. ?Location:  ?Duration:  ?Quality:  ?Associated Signs/Symptoms: ?Modifying Factors:  ?Severity:  ?Timing: ?Context:  ? ?Objective  ?Well appearing patient in no apparent distress; mood and affect are within normal limits. ?Dry itchy skin essentially all over. ? ?Left Buccal Cheek, Left Hand - Posterior ?Patient has severe diffuse sun damage with multiple smaller actinic keratoses, 2 thicker lesions which may represent CIS versus hypertrophic AK's.  These 2 spots will be treated with liquid nitrogen freeze. ? ?Left Postauricular Sulcus ?Pervious skin surgery center treatment with Mohs surgery ? ? ? ?All skin waist up examined. ? ? ?Assessment & Plan  ? ? ?AK (actinic keratosis) (2) ?Left Hand - Posterior; Left Buccal Cheek ? ?Destruction of lesion - Left Buccal Cheek, Left Hand - Posterior ?Complexity: simple   ?Destruction method: cryotherapy   ?Informed consent: discussed and consent obtained   ?Timeout:  patient name, date of birth, surgical site, and procedure verified ?Lesion destroyed using liquid nitrogen: Yes   ?Cryotherapy cycles:  3 ?Outcome: patient tolerated procedure well with no complications   ?Post-procedure details: wound care instructions given   ? ?Dry skin ? ?Discussed proper ways to hydrate skin along with the possibility that he has a component of cutaneous dysesthesia or a repetitive scratch itch cycle.  Pick up over the counter Cerave Itch relief or other pramoxine containing antipruritic lotion and use this instead of scratching on a as needed basis. ? ?History of squamous cell  carcinoma ?Left Postauricular Sulcus ? ?Bridging on the post ear from surgery to watch.  It is somewhat bothersome because his glasses rub the area, but I do not think repair would be easy. ? ? ? ? ? ?I, Lavonna Monarch, MD, have reviewed all documentation for this visit.  The documentation on 05/27/21 for the exam, diagnosis, procedures, and orders are all accurate and complete. ?

## 2021-05-29 ENCOUNTER — Telehealth: Payer: Self-pay | Admitting: Dermatology

## 2021-05-29 NOTE — Telephone Encounter (Signed)
Patient is calling to say that he used used Nystatin Powder that he found and it seems to be working in the groin area.  Patient wants to know if he can get a prescription sent in to ?[id number](Ph: [phone] ?

## 2021-05-29 NOTE — Telephone Encounter (Signed)
Phone call to patient to see what kind of prescription he is wanting. Left message to call back.  ?

## 2021-05-30 ENCOUNTER — Ambulatory Visit: Payer: Medicare PPO | Attending: Internal Medicine

## 2021-05-30 DIAGNOSIS — M6281 Muscle weakness (generalized): Secondary | ICD-10-CM | POA: Diagnosis present

## 2021-05-30 DIAGNOSIS — R2689 Other abnormalities of gait and mobility: Secondary | ICD-10-CM | POA: Diagnosis present

## 2021-05-30 DIAGNOSIS — G8929 Other chronic pain: Secondary | ICD-10-CM | POA: Insufficient documentation

## 2021-05-30 DIAGNOSIS — R293 Abnormal posture: Secondary | ICD-10-CM | POA: Diagnosis present

## 2021-05-30 DIAGNOSIS — R2681 Unsteadiness on feet: Secondary | ICD-10-CM | POA: Diagnosis present

## 2021-05-30 DIAGNOSIS — M545 Low back pain, unspecified: Secondary | ICD-10-CM | POA: Diagnosis present

## 2021-05-30 NOTE — Therapy (Signed)
?OUTPATIENT PHYSICAL THERAPY TREATMENT NOTE ?Recert/Progress note ? ? ?Patient Name: Gary [name] ?MRN: [id number] ?DOB:02-19-1932, 88 y.o., male ?Today's Date: 05/31/2021 ? ?PCP: Clovia Cuff, MD ?REFERRING PROVIDER: Andrey Spearman ?  Progress Note ? ?Reporting period 03/01/21 to 05/30/21 ? ?See Note below for Objective Data and Assessment of Progress/Goals ? ? ? PT End of Session - 05/30/21 1403   ? ? Visit Number 9   ? Number of Visits 25   ? Date for PT Re-Evaluation 07/27/21   ? Authorization Type humana medicare so 10th visit progress note. PT requested new [employer] auth on 05/30/21   ? Authorization Time Period 17 visits 03/01/21 through 05/03/21   ? Authorization - Visit Number 1   ? Authorization - Number of Visits --   ? Progress Note Due on Visit 19   ? PT Start Time 1400   ? PT Stop Time 4401   ? PT Time Calculation (min) 43 min   ? Equipment Utilized During Treatment Gait belt   ? Activity Tolerance Patient tolerated treatment well   ? Behavior During Therapy [facility] for tasks assessed/performed   ? ?  ?  ? ?  ? ? ?Past Medical History:  ?Diagnosis Date  ? Adenomatous colon polyp   ? 2010   ? Anemia   ? Anxiety   ? Arthritis   ? "hips and lower back"  ? Arthritis pain   ? Atypical mole 06/21/2003  ? left neck, inferior - slight to moderate atypia  ? Basal cell carcinoma 10/04/2014  ? right shin bcc (tx cx3, 87f )  ? Blood transfusion   ? Chest pain 03/29/2011  ? 2D Echo without contrast on 03/29/11 - EF= 55-60%.  ? Cholecystitis   ? Chronic back pain greater than 3 months duration   ? Chronic kidney disease   ? Colon polyps   ? Complication of anesthesia   ? once slow to wake up  ? Coronary angioplasty status 2011  ? Coronary artery disease   ? Diarrhea   ? Diverticulosis   ? Dyslipidemia   ? GERD (gastroesophageal reflux disease)   ? GI bleeding after 07/2009  ? "triggered by Plavix & ASA; from my diverticulosis"  ? H/O Clostridium difficile infection   ? H/O myocardial perfusion scan 04/04/2011  ? For  Pre-noncardiac surgery - EF = 67%, no ischemia, and is considered low risk.  ? Heart attack ([place] 07/[place]  ? nonSTEMI with an occluded circumflex artery and collaterals.  ? Heart murmur   ? Hemorrhoids   ? History of shingles   ? Hyperlipidemia   ? Hypertension   ? Kidney stones   ? Lower extremity edema   ? Taking furosemide and it seems to be helping.  ? Neuromuscular disorder ([place]   ? Nodular infiltrative basal cell carcinoma (BCC) 02/08/2021  ? Right Forearm-anterior (tx p bx)  ? Nonspecific chest pain 08/26/2012  ? Went to ED 08/26/12  ? Parkinsonism ([place] [facility]   ? SCCA (squamous cell carcinoma) of skin 02/08/2021  ? Right Dorsal Hand (well diff) (tx p bx)  ? Squamous cell carcinoma of skin 05/13/2000  ? Right outer forehead  ? Squamous cell carcinoma of skin 03/02/2001  ? Post crown - tx 03/19/2001  ? Squamous cell carcinoma of skin 09/01/2001  ? Left post auricular - tx 10/09/2001  ? Squamous cell carcinoma of skin 06/21/2003  ? Top right ear - tx 07/05/2003  ? Squamous cell carcinoma of skin 06/21/2003  ? Right  upper arm - tx 07/05/2003  ? Squamous cell carcinoma of skin 10/12/2003  ? Left superior ear lobe - MOHs  ? Squamous cell carcinoma of skin 12/15/2006  ? Right ear, superior - tx 01/26/2007  ? Squamous cell carcinoma of skin 03/13/2009  ? Right temple - tx 05/08/2009  ? Squamous cell carcinoma of skin 03/24/2013  ? Upper left forearm - tx p bx  ? Squamous cell carcinoma of skin 12/06/2013  ? Right forearm - CX3 + 5FU  ? Squamous cell carcinoma of skin 02/10/2014  ? Right hand - tx p bx  ? Squamous cell carcinoma of skin 10/04/2014  ? Right shin - tx p bx  ? Squamous cell carcinoma of skin 08/21/2016  ? Left hand - tx p bx  ? Squamous cell carcinoma of skin 01/01/2017  ? Right hand - tx 02/06/2017  ? Squamous cell carcinoma of skin 10/21/2017  ? Left side of scalp - tx 03/12/2018  ? Squamous cell carcinoma of skin 10/21/2017  ? Left post scalp, inf - tx 08/13/2018  ? Squamous cell carcinoma of skin  10/21/2017  ? Right scalp - tx p bx  ? Squamous cell carcinoma of skin 10/21/2017  ? Left cheek - tx 03/12/2018  ? Squamous cell carcinoma of skin 08/13/2018  ? Left top hand, proximal pinky - tx p bx  ? Squamous cell carcinoma of skin 12/23/2018  ? Right bicep lower - tx p bx  ? Squamous cell carcinoma of skin 05/18/2019  ? Right 4th finger metacarpophalangeal joint  ? Squamous cell carcinoma of skin 05/18/2019  ? Left 5th finger metacarpophalangeal joint  ? Squamous cell carcinoma of skin 05/18/2019  ? Left 2nd finger metacarpophalangeal joint  ? Stomach problems   ? Syncope and collapse 04/16/2018  ? ?Past Surgical History:  ?Procedure Laterality Date  ? BACK SURGERY  1997  ? CARDIAC CATHETERIZATION  08/09/2009  ? Circumflex 100% occluded with right-to-left collaterals, mild irregularities in the proximal and distal LAD and diagonal system, normal LV systolic function, and minor infrarenal irregularities, but no abdominal aortic aneurysm.  ? CHOLECYSTECTOMY  04/12/2011  ? Procedure: CHOLECYSTECTOMY;  Surgeon: Adin Hector, MD;  Location: [place];  Service: General;  Laterality: N/A;  ? CHOLECYSTECTOMY  04/12/2011  ? Procedure: LAPAROSCOPIC CHOLECYSTECTOMY;  Surgeon: Adin Hector, MD;  Location: [facility];  Service: General;  Laterality: N/A;  converted to open  ? COLON SURGERY    ? COLONOSCOPY  ? ESOPHAGOGASTRODUODENOSCOPY N/A 08/05/2013  ? Procedure: ESOPHAGOGASTRODUODENOSCOPY (EGD);  Surgeon: Jerene Bears, MD;  Location: [place];  Service: Gastroenterology;  Laterality: N/A;  ? EYE SURGERY  1942  ? "right eye was crossed; they  corrected it"  ? LUMBAR FUSION  07/22/2012  ? PACEMAKER IMPLANT N/A 04/17/2018  ? Procedure: PACEMAKER IMPLANT;  Surgeon: Deboraha Sprang, MD;  Location: [facility];  Service: Cardiovascular;  Laterality: N/A;  ? POSTERIOR FUSION LUMBAR SPINE  08/1995  ? "bottom 4 vertebra"  ? SUBTOTAL COLECTOMY  01/24/2010  ? TOTAL HIP ARTHROPLASTY Right 08/03/2013  ? Procedure: TOTAL HIP  ARTHROPLASTY ANTERIOR APPROACH;  Surgeon: Hessie Dibble, MD;  Location: [facility];  Service: Orthopedics;  Laterality: Right;  ? ?Patient Active Problem List  ? Diagnosis Date Noted  ? Vitamin B12 deficiency 04/15/2019  ? Abnormal glucose 04/15/2019  ? Stage 4 chronic kidney disease ([place]) 04/15/2019  ? Dyspnea on exertion 04/15/2019  ? Pulmonary hypertension ([place]) 04/08/2019  ? Chest pain 03/27/2019  ?  Complete heart block ([place]) 04/17/2018  ? Syncope and collapse 04/16/2018  ? Sinus pause 04/16/2018  ? Snoring 10/03/2017  ? Other fatigue 10/03/2017  ? Pre-syncope 05/14/2016  ? Murmur 05/14/2016  ? CAD in native artery 10/31/2015  ? History of MI (myocardial infarction) 10/31/2015  ? Preoperative cardiovascular examination 12/26/2014  ? History of colonic polyps 01/17/2014  ? PAF (paroxysmal atrial fibrillation) ([place]) 08/14/2013  ? Metabolic acidosis 03/24/1733  ? Acute renal failure ([place]) 08/13/2013  ? Stage 3b chronic kidney disease ([place]) 08/13/2013  ? Anemia in chronic kidney disease 08/13/2013  ? Hypokalemia 08/12/2013  ? Acute blood loss anemia 08/09/2013  ? Encephalopathy 08/09/2013  ? Severe sepsis ([place]) 08/08/2013  ? Hypotension, unspecified 08/07/2013  ? Complex renal cyst 08/07/2013  ? Renal mass, left 08/06/2013  ? Upper GI bleeding 08/05/2013  ? Acute esophagitis 08/05/2013  ? Urinary retention 08/04/2013  ? S/P total hip arthroplasty 08/03/2013  ? Degenerative joint disease (DJD) of hip 08/03/2013  ?  Class: Chronic  ? Internal hemorrhoids with other complication 67/02/4101  ? Tinnitus 10/22/2012  ? C. difficile colitis 07/26/2012  ? Ileus, postoperative ([place]) 07/24/2012  ? Somnolence 07/24/2012  ? Anxiety state 07/24/2012  ? Chest pain- ER 08/26/12 03/30/2011  ? Fever 03/28/2011  ? Leucocytosis 03/28/2011  ? CKD (chronic kidney disease), stage III ([place]) 03/28/2011  ? Hyponatremia 02/02/2011  ? BPH (benign prostatic hyperplasia) 02/01/2011  ? Cholecystitis chronic, acute 01/26/2011  ? Dyslipidemia  01/22/2011  ? Abdominal bloating 08/07/2010  ? Loose stools 08/07/2010  ? Coronary atherosclerosis 10/26/2009  ? Essential hypertension 06/07/2009  ? DIVERTICULOSIS OF COLON 10/31/2008  ? DIVERTICULOSIS, COLON, WITH HEMORRHAGE, surg 1

## 2021-06-07 NOTE — Telephone Encounter (Signed)
After several attempt to reach patient and messages left we will wait for patient to call us back.  ?

## 2021-06-09 IMAGING — DX DG CHEST 1V PORT
2 series · 2 of 2 positions shown · non-contrast
Comparison: April 18, 2018.

CLINICAL DATA: Chest pain, shortness of breath.

EXAM:
PORTABLE CHEST 1 VIEW

[chest ap (1 of 2)]
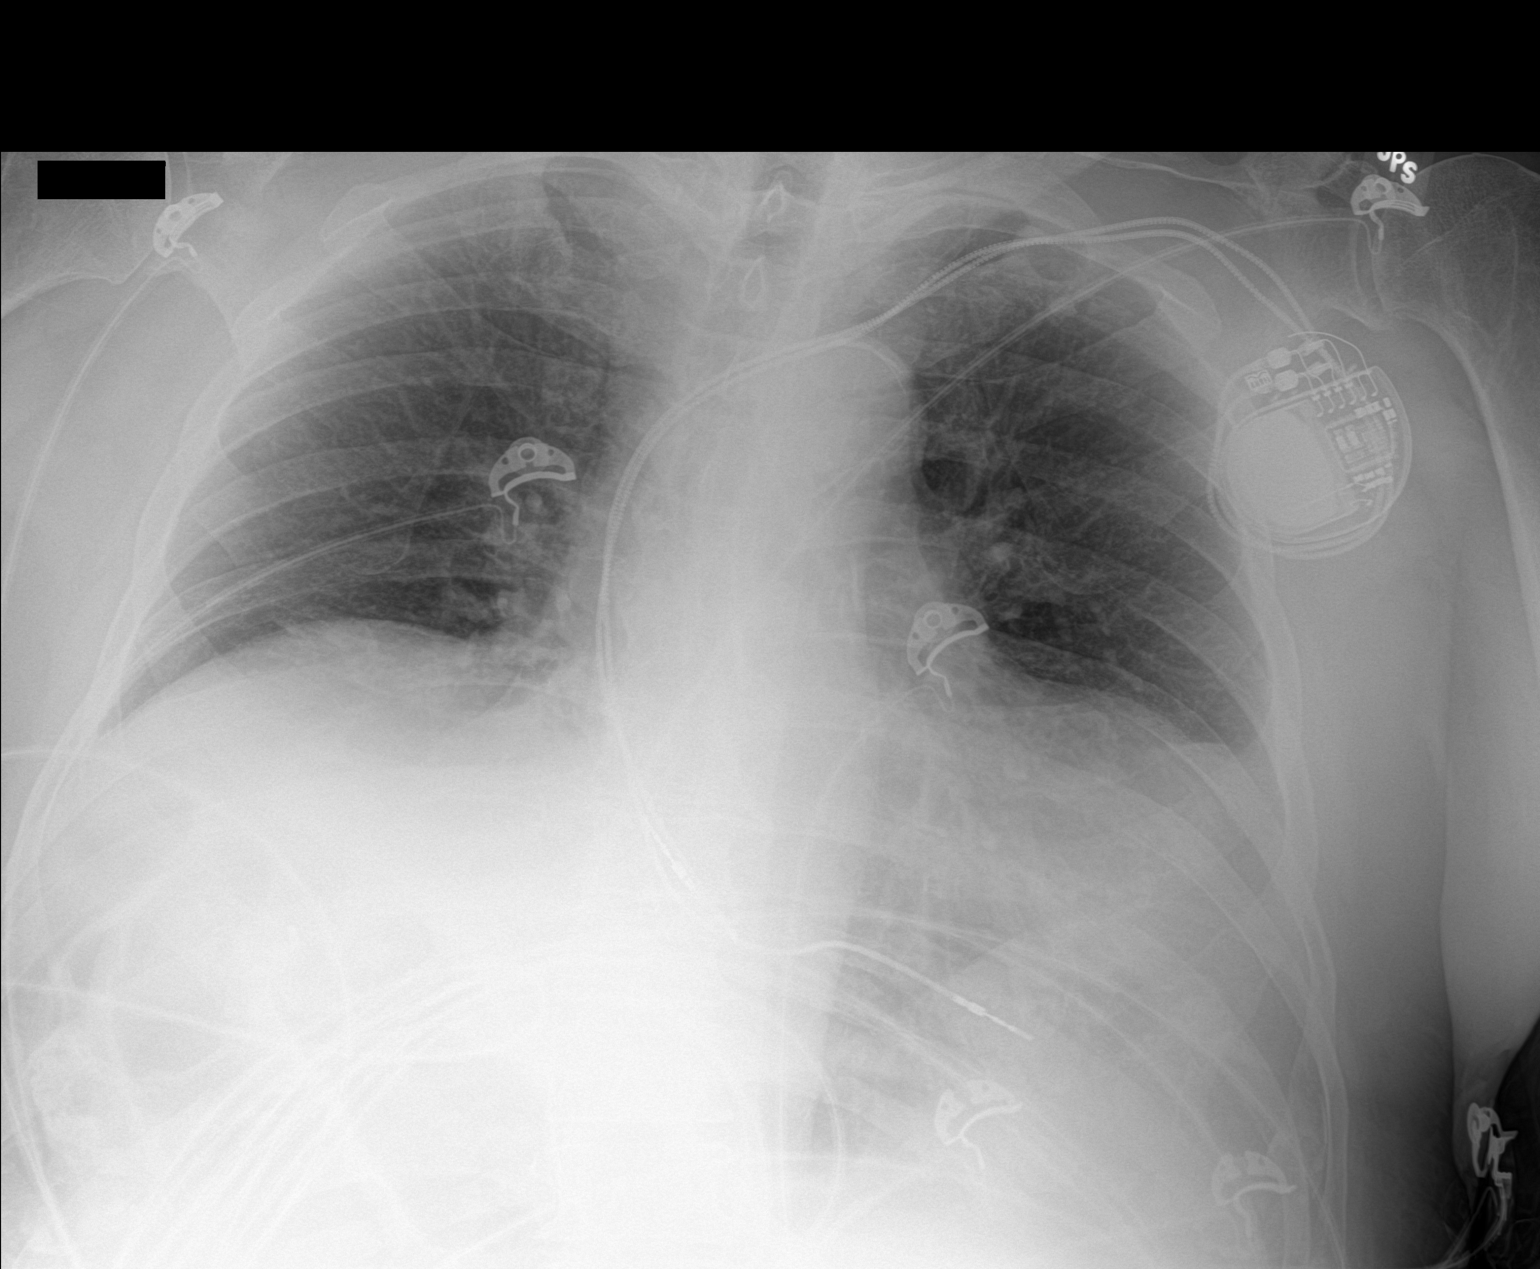

[chest ap (2 of 2)]
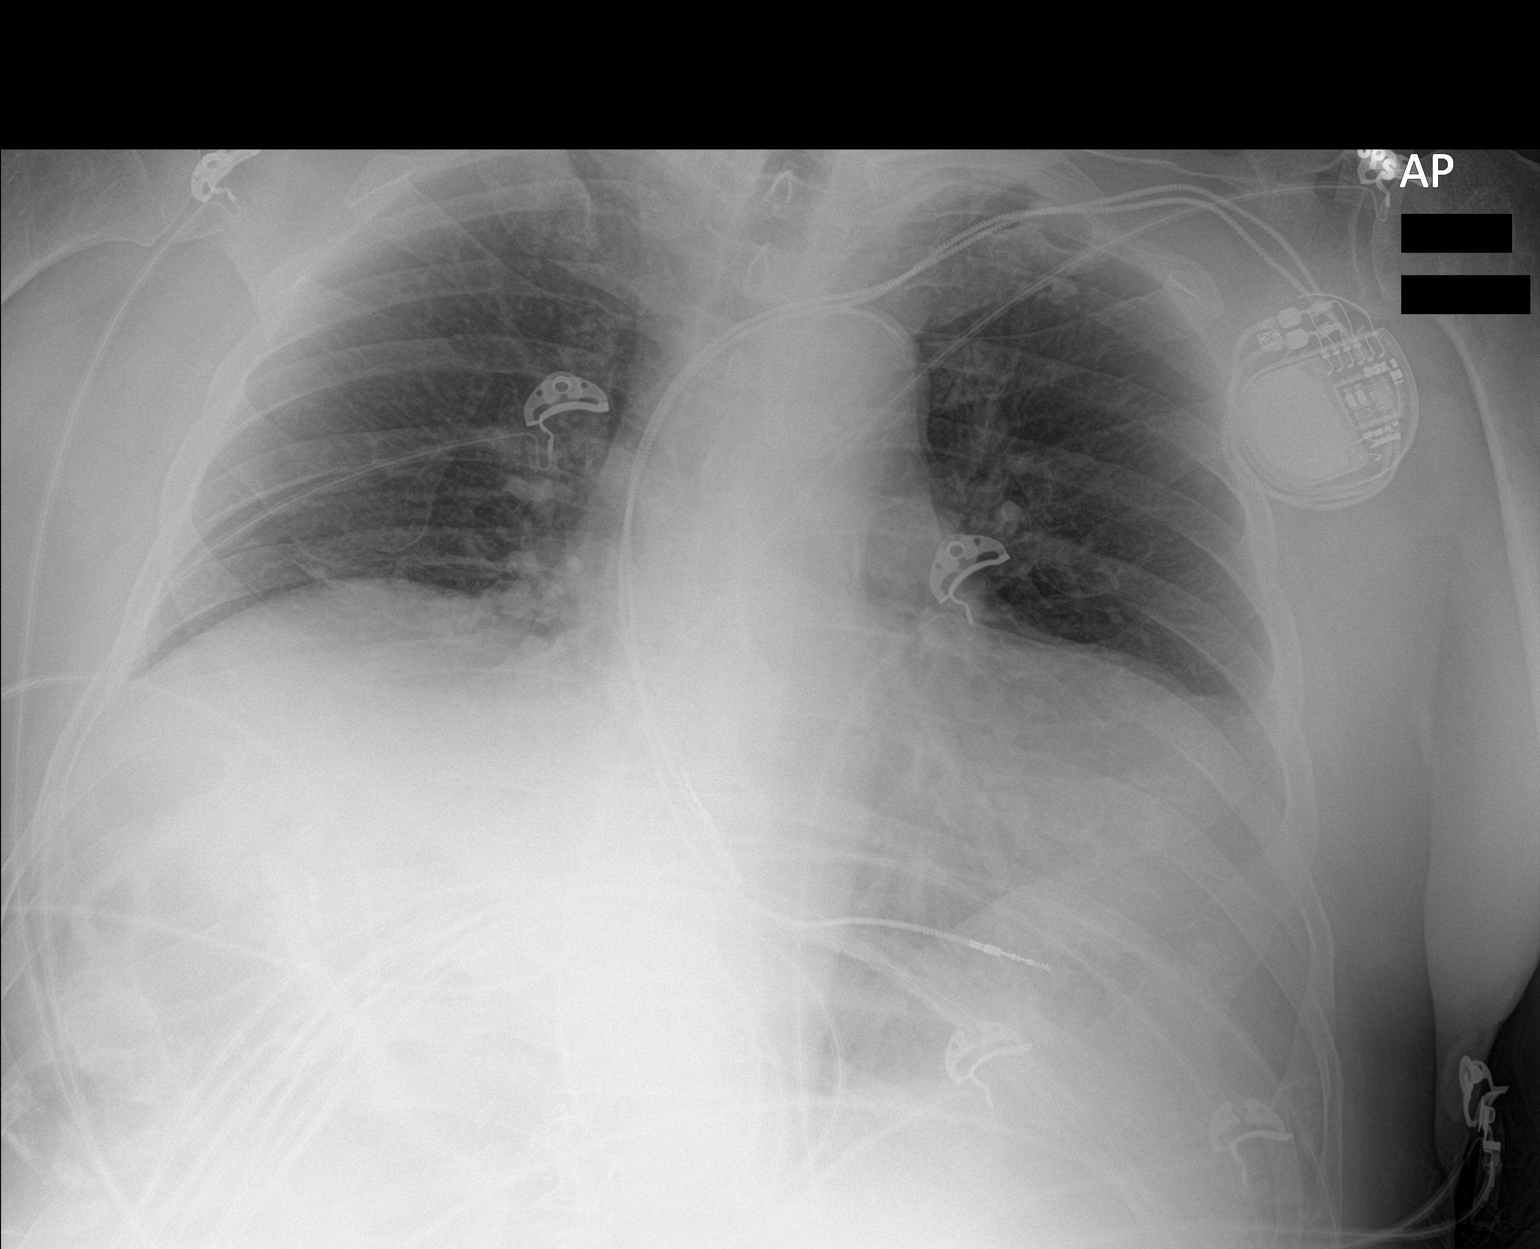

[2 of 2 positions shown; findings below may reference images not displayed]

FINDINGS: Stable cardiomegaly. Left-sided pacemaker is unchanged in position.
No pneumothorax or pleural effusion is noted. Hypoinflation of the
lungs is noted. Lungs are clear. Bony thorax is unremarkable.
IMPRESSION: No active disease.

Aortic Atherosclerosis ([id number]).

## 2021-06-25 ENCOUNTER — Telehealth: Payer: Self-pay | Admitting: Internal Medicine

## 2021-06-25 ENCOUNTER — Encounter: Payer: Self-pay | Admitting: Internal Medicine

## 2021-06-25 NOTE — Telephone Encounter (Signed)
Spoke with pt wife, she reports swelling in his feet that is there in the morning when he gets up. He does not weigh daily but she reports he is up a few lbs in the last couple months. She reports he has SOB when walking to the mailbox not with ADL. He has now gotten a chair where he can elevate his legs while sitting and that has helped some. He will be restarting PT this Thursday. He is currently taking furosemide just 3 days a week and they were given the okay for him to take once daily until swelling resolves. She was also told he needs to eat an extra potassium rich food daily while taking the lasix. Follow up scheduled next available and they will call back to the office if symptoms change or worsen. ?

## 2021-06-25 NOTE — Telephone Encounter (Signed)
Pt c/o swelling: STAT is pt has developed SOB within 24 hours ? ?How much weight have you gained and in what time span? Yes (over a couple of months) ? ?If swelling, where is the swelling located? Legs and feet  ? ?Are you currently taking a fluid pill? yes ? ?Are you currently SOB? Yes when talking or bending over ? ?Do you have a log of your daily weights (if so, list)?   ? ?Have you gained 3 pounds in a day or 5 pounds in a week? no ? ?Have you traveled recently? no ? ?

## 2021-06-28 ENCOUNTER — Encounter: Payer: Self-pay | Admitting: Physical Therapy

## 2021-06-28 ENCOUNTER — Ambulatory Visit: Payer: Medicare PPO | Attending: Internal Medicine | Admitting: Physical Therapy

## 2021-06-28 DIAGNOSIS — R2681 Unsteadiness on feet: Secondary | ICD-10-CM | POA: Diagnosis present

## 2021-06-28 DIAGNOSIS — R293 Abnormal posture: Secondary | ICD-10-CM | POA: Insufficient documentation

## 2021-06-28 DIAGNOSIS — M6281 Muscle weakness (generalized): Secondary | ICD-10-CM | POA: Insufficient documentation

## 2021-06-28 DIAGNOSIS — R2689 Other abnormalities of gait and mobility: Secondary | ICD-10-CM | POA: Diagnosis present

## 2021-06-28 NOTE — Therapy (Signed)
OUTPATIENT PHYSICAL THERAPY TREATMENT NOTE    Patient Name: Gary [name] MRN: [id number] DOB:08-13-1932, 88 y.o., male Today's Date: 06/29/2021  PCP: Clovia Cuff, MD REFERRING PROVIDER: Andrey Spearman       PT End of Session - 06/28/21 1534     Visit Number 20    Number of Visits 25    Date for PT Re-Evaluation 07/27/21    Authorization Type humana medicare so 10th visit progress note. PT requested new [employer] auth on 05/30/21    Authorization Time Period 17 visits 03/01/21 through 05/03/21; awaiting new auth    Authorization - Visit Number 2    Progress Note Due on Visit 21    PT Start Time 1532    PT Stop Time 1614    PT Time Calculation (min) 42 min    Equipment Utilized During Treatment --    Activity Tolerance Patient tolerated treatment well    Behavior During Therapy WFL for tasks assessed/performed             Past Medical History:  Diagnosis Date   Adenomatous colon polyp    2010    Anemia    Anxiety    Arthritis    "hips and lower back"   Arthritis pain    Atypical mole 06/21/2003   left neck, inferior - slight to moderate atypia   Basal cell carcinoma 10/04/2014   right shin bcc (tx cx3, 42fu )   Blood transfusion    Chest pain 03/29/2011   2D Echo without contrast on 03/29/11 - EF= 55-60%.   Cholecystitis    Chronic back pain greater than 3 months duration    Chronic kidney disease    Colon polyps    Complication of anesthesia    once slow to wake up   Coronary angioplasty status 2011   Coronary artery disease    Diarrhea    Diverticulosis    Dyslipidemia    GERD (gastroesophageal reflux disease)    GI bleeding after 07/2009   "triggered by Plavix & ASA; from my diverticulosis"   H/O Clostridium difficile infection    H/O myocardial perfusion scan 04/04/2011   For Pre-noncardiac surgery - EF = 67%, no ischemia, and is considered low risk.   Heart attack ([place]) 07/2009   nonSTEMI with an occluded circumflex artery and collaterals.    Heart murmur    Hemorrhoids    History of shingles    Hyperlipidemia    Hypertension    Kidney stones    Lower extremity edema    Taking furosemide and it seems to be helping.   Neuromuscular disorder ([place])    Nodular infiltrative basal cell carcinoma (BCC) 02/08/2021   Right Forearm-anterior (tx p bx)   Nonspecific chest pain 08/26/2012   Went to ED 08/26/12   Parkinsonism ([place])    SCCA (squamous cell carcinoma) of skin 02/08/2021   Right Dorsal Hand (well diff) (tx p bx)   Squamous cell carcinoma of skin 05/13/2000   Right outer forehead   Squamous cell carcinoma of skin 03/02/2001   Post crown - tx 03/19/2001   Squamous cell carcinoma of skin 09/01/2001   Left post auricular - tx 10/09/2001   Squamous cell carcinoma of skin 06/21/2003   Top right ear - tx 07/05/2003   Squamous cell carcinoma of skin 06/21/2003   Right upper arm - tx 07/05/2003   Squamous cell carcinoma of skin 10/12/2003   Left superior ear lobe - MOHs   Squamous cell carcinoma  of skin 12/15/2006   Right ear, superior - tx 01/26/2007   Squamous cell carcinoma of skin 03/13/2009   Right temple - tx 05/08/2009   Squamous cell carcinoma of skin 03/24/2013   Upper left forearm - tx p bx   Squamous cell carcinoma of skin 12/06/2013   Right forearm - CX3 + 5FU   Squamous cell carcinoma of skin 02/10/2014   Right hand - tx p bx   Squamous cell carcinoma of skin 10/04/2014   Right shin - tx p bx   Squamous cell carcinoma of skin 08/21/2016   Left hand - tx p bx   Squamous cell carcinoma of skin 01/01/2017   Right hand - tx 02/06/2017   Squamous cell carcinoma of skin 10/21/2017   Left side of scalp - tx 03/12/2018   Squamous cell carcinoma of skin 10/21/2017   Left post scalp, inf - tx 08/13/2018   Squamous cell carcinoma of skin 10/21/2017   Right scalp - tx p bx   Squamous cell carcinoma of skin 10/21/2017   Left cheek - tx 03/12/2018   Squamous cell carcinoma of skin 08/13/2018   Left top hand,  proximal pinky - tx p bx   Squamous cell carcinoma of skin 12/23/2018   Right bicep lower - tx p bx   Squamous cell carcinoma of skin 05/18/2019   Right 4th finger metacarpophalangeal joint   Squamous cell carcinoma of skin 05/18/2019   Left 5th finger metacarpophalangeal joint   Squamous cell carcinoma of skin 05/18/2019   Left 2nd finger metacarpophalangeal joint   Stomach problems    Syncope and collapse 04/16/2018   Past Surgical History:  Procedure Laterality Date   [place]  08/09/2009   Circumflex 100% occluded with right-to-left collaterals, mild irregularities in the proximal and distal LAD and diagonal system, normal LV systolic function, and minor infrarenal irregularities, but no abdominal aortic aneurysm.   CHOLECYSTECTOMY  04/12/2011   Procedure: CHOLECYSTECTOMY;  Surgeon: Adin Hector, MD;  Location: [facility];  Service: General;  Laterality: N/A;   CHOLECYSTECTOMY  04/12/2011   Procedure: LAPAROSCOPIC CHOLECYSTECTOMY;  Surgeon: Adin Hector, MD;  Location: [facility];  Service: General;  Laterality: N/A;  converted to open   COLON SURGERY     COLONOSCOPY   ESOPHAGOGASTRODUODENOSCOPY N/A 08/05/2013   Procedure: ESOPHAGOGASTRODUODENOSCOPY (EGD);  Surgeon: Jerene Bears, MD;  Location: [facility];  Service: Gastroenterology;  Laterality: N/A;   EYE SURGERY  1942   "right eye was crossed; they  corrected it"   LUMBAR FUSION  07/22/2012   PACEMAKER IMPLANT N/A 04/17/2018   Procedure: PACEMAKER IMPLANT;  Surgeon: Deboraha Sprang, MD;  Location: [facility];  Service: Cardiovascular;  Laterality: N/A;   POSTERIOR FUSION LUMBAR SPINE  08/1995   "bottom 4 vertebra"   SUBTOTAL COLECTOMY  01/24/2010   TOTAL HIP ARTHROPLASTY Right 08/03/2013   Procedure: TOTAL HIP ARTHROPLASTY ANTERIOR APPROACH;  Surgeon: Hessie Dibble, MD;  Location: [facility];  Service: Orthopedics;  Laterality: Right;   Patient Active Problem List   Diagnosis Date Noted    Vitamin B12 deficiency 04/15/2019   Abnormal glucose 04/15/2019   Stage 4 chronic kidney disease ([place]) 04/15/2019   Dyspnea on exertion 04/15/2019   Pulmonary hypertension ([place]) 04/08/2019   Chest pain 03/27/2019   Complete heart block ([place]) 04/17/2018   Syncope and collapse 04/16/2018   Sinus pause 04/16/2018   Snoring 10/03/2017   Other fatigue 10/03/2017  Pre-syncope 05/14/2016   Murmur 05/14/2016   CAD in native artery 10/31/2015   History of MI (myocardial infarction) 10/31/2015   Preoperative cardiovascular examination 12/26/2014   History of colonic polyps 01/17/2014   PAF (paroxysmal atrial fibrillation) (H[facility]) 59/93/5701   Metabolic acidosis 77/93/9030   Acute renal failure ([place]) 08/13/2013   Stage 3b chronic kidney disease ([place]) 08/13/2013   Anemia in chronic kidney disease 08/13/2013   Hypokalemia 08/12/2013   Acute blood loss anemia 08/09/2013   Encephalopathy 08/09/2013   Severe sepsis ([place]) 08/08/2013   Hypotension, unspecified 08/07/2013   Complex renal cyst 08/07/2013   Renal mass, left 08/06/2013   Upper GI bleeding 08/05/2013   Acute esophagitis 08/05/2013   Urinary retention 08/04/2013   S/P total hip arthroplasty 08/03/2013   Degenerative joint disease (DJD) of hip 08/03/2013    Class: Chronic   Internal hemorrhoids with other complication 11/03/3005   Tinnitus 10/22/2012   C. difficile colitis 07/26/2012   Ileus, postoperative ([place]) 07/24/2012   Somnolence 07/24/2012   Anxiety state 07/24/2012   Chest pain- ER 08/26/12 03/30/2011   Fever 03/28/2011   Leucocytosis 03/28/2011   CKD (chronic kidney disease), stage III ([place]) 03/28/2011   Hyponatremia 02/02/2011   BPH (benign prostatic hyperplasia) 02/01/2011   Cholecystitis chronic, acute 01/26/2011   Dyslipidemia 01/22/2011   Abdominal bloating 08/07/2010   Loose stools 08/07/2010   Coronary atherosclerosis 10/26/2009   Essential hypertension 06/07/2009   DIVERTICULOSIS OF COLON 10/31/2008    DIVERTICULOSIS, COLON, WITH HEMORRHAGE, surg 12/11  10/31/2008   Personal history of colonic polyps 10/31/2008    REFERRING DIAG: abnormality of gait and mobilty   THERAPY DIAG:  Other abnormalities of gait and mobility  Muscle weakness (generalized)  Unsteadiness on feet  Abnormal posture  PERTINENT HISTORY: anxiety, arthritis, basal cell carcinoma, back pain, chronic kidney disease, CAD, MI, heart murmur, HTN, lumbar fusion, pacemaker, right THR, mild memory loss   PRECAUTIONS: Fall;Pacemaker   SUBJECTIVE: Pt returns several weeks after recert was done due to scheduling (his). No falls or pain to report today.    PAIN:  Are you having pain? Yes: NPRS scale: 0l/10 Pain location: low back Pain description: aching Aggravating factors: standing Relieving factors: sitting        TODAY'S TREATMENT: 06/28/2021 STRENGTHENING  NuStep UE/LE's level 4  x 8 minutes with goal >/= 60 steps per minute for strengthening and activity tolerance.  Reviewed HEP from earlier in episode of care and added new standing ex's for strengthening. No issues noted or reported with performance in session. Refer to [place] program below for full details.    GAIT: Gait pattern: step through pattern, decreased step length- Right, decreased step length- Left, decreased stride length, and trunk flexed Distance walked: 230 x 1, plus around clinic with session Assistive device utilized: Walker - 2 wheeled Level of assistance:  min guard assist to supervision Comments: cues through out for posture, walker position as pt tends to push it too far out, and for increased bil step length      PATIENT EDUCATION: Education details: revised HEP and progress toward STGs Person educated: Patient Education method: Explanation Education comprehension: verbalized understanding   HOME EXERCISE PROGRAM: Access Code: [facility] URL: [email or web address] Date: 06/28/2021 Prepared by: Willow Ora  Exercises - Sit to Stand with Armchair  - 1 x daily - 5 x weekly - 1 sets - 10 reps - Seated Hamstring Stretch  - 1 x daily - 5 x weekly - 1 sets - 3  reps - 30 hold - Heel Raises with Counter Support  - 1 x daily - 5 x weekly - 1 sets - 10 reps - Standing March with Counter Support  - 1 x daily - 5 x weekly - 1 sets - 10 reps - Standing Hip Abduction with Counter Support  - 1 x daily - 5 x weekly - 1 sets - 10 reps - Standing Knee Flexion with Counter Support  - 1 x daily - 5 x weekly - 1 sets - 10 reps      GOALS  SHORT TERM GOALS:   Target date: 06/28/2021  Pt will be able to perform progressive HEP for strengthening and balance to continue gains on own. Baseline: 06/28/21- reviewed old program and added new ones today Goal status: MET  2.  Pt will decrease TUG from 47.99 sec with cane to <40 sec with cane for improved balance and functional mobility. Baseline: 05/30/21 47.99 sec with cane Goal status: ONGOING  3.  Pt will be able to ambulate 500' on level surfaces with RW mod I for improved household and short community distances. Baseline: 06/28/21: 230 feet with supervision, increased distance just not to goal level Goal status: PARTIALLY MET    LONG TERM GOALS:  Target date:  07/27/21  Pt will increase Berg Balance score from 41 to 45 for improved balance and decreased fall risk. Baseline: 05/30/21 41/56 Goal status: INITIAL  2.  Pt will increase gait speed from 0.87m/s to >0.39m/s for improved household and community mobility. Baseline: 05/30/21 0.64m/s with RW Goal status: INITIAL  3.  Pt will ambulate 200' with cane mod I on level surfaces for improved household mobility. Baseline: currently uses RW Goal status: INITIAL  4.  Pt will ambulate >800' on varied surfaces with RW mod I for improved community mobility. Baseline:  Goal status: INITIAL  5.  Pt will be report decrease in low back/right hip pain to 6/10 at worst with activities.  Baseline: 05/30/21  7-8/10 at worst Goal status: INITIAL     Plan - 05/31/21 0839     Clinical Impression Statement Pt returns several weeks after recert due to other appt/issues preventing him from getting her. Began to address STGs with goal #1 met and #3 partially met. HEP reviewed and new ex's added today with no issues noted or reported. Will plan to address remaining STG at next session.    Personal Factors and Comorbidities Comorbidity 3+;Age    Comorbidities anxiety, arthritis, basal cell carcinoma, back pain, chronic kidney disease, CAD, MI, heart murmur, HTN, lumbar fusion, pacemaker, right THR, mild memory loss    Examination-Activity Limitations Locomotion Level;Transfers;Stand;Stairs;Squat    Examination-Participation Restrictions Cleaning;Meal Prep;Community Activity    Stability/Clinical Decision Making Evolving/Moderate complexity    Rehab Potential Good    PT Frequency 2x / week    PT Duration 8 weeks    PT Treatment/Interventions ADLs/Self Care Home Management;DME Instruction;Cryotherapy;Moist Heat;Gait training;Stair training;Functional mobility training;Therapeutic activities;Therapeutic exercise;Balance training;Neuromuscular re-education;Manual techniques;Passive range of motion;Vestibular;Patient/family education    PT Next Visit Plan Check remaining STG.  Continue to work on LE/core strengthening, Personnel officer and balance training with decreased UE support on solid surface progressing to compliant surfaces as able.    PT Home Exercise Plan Access Code: [facility]               Willow Ora, PTA, [place] [address] [place], [place] [address] [phone] 06/29/21, 3:56 PM

## 2021-06-29 ENCOUNTER — Ambulatory Visit (INDEPENDENT_AMBULATORY_CARE_PROVIDER_SITE_OTHER): Payer: Medicare PPO

## 2021-06-29 DIAGNOSIS — I442 Atrioventricular block, complete: Secondary | ICD-10-CM | POA: Diagnosis not present

## 2021-07-01 LAB — CUP PACEART REMOTE DEVICE CHECK
Battery Remaining Longevity: 106 mo
Battery Voltage: 3 V
Brady Statistic AP VP Percent: 45.62 %
Brady Statistic AP VS Percent: 0.01 %
Brady Statistic AS VP Percent: 54.29 %
Brady Statistic AS VS Percent: 0.09 %
Brady Statistic RA Percent Paced: 45.38 %
Brady Statistic RV Percent Paced: 99.91 %
Date Time Interrogation Session: 20230518224533
Implantable Lead Implant Date: 20200306
Implantable Lead Implant Date: 20200306
Implantable Lead Location: 753859
Implantable Lead Location: 753860
Implantable Lead Model: 5076
Implantable Lead Model: 5076
Implantable Pulse Generator Implant Date: 20200306
Lead Channel Impedance Value: 323 Ohm
Lead Channel Impedance Value: 361 Ohm
Lead Channel Impedance Value: 380 Ohm
Lead Channel Impedance Value: 456 Ohm
Lead Channel Pacing Threshold Amplitude: 0.5 V
Lead Channel Pacing Threshold Amplitude: 0.5 V
Lead Channel Pacing Threshold Pulse Width: 0.4 ms
Lead Channel Pacing Threshold Pulse Width: 0.4 ms
Lead Channel Sensing Intrinsic Amplitude: 0.625 mV
Lead Channel Sensing Intrinsic Amplitude: 0.625 mV
Lead Channel Sensing Intrinsic Amplitude: 9.5 mV
Lead Channel Sensing Intrinsic Amplitude: 9.5 mV
Lead Channel Setting Pacing Amplitude: 1.5 V
Lead Channel Setting Pacing Amplitude: 2.5 V
Lead Channel Setting Pacing Pulse Width: 0.4 ms
Lead Channel Setting Sensing Sensitivity: 0.6 mV

## 2021-07-03 ENCOUNTER — Encounter: Payer: Self-pay | Admitting: Physical Therapy

## 2021-07-03 ENCOUNTER — Ambulatory Visit: Payer: Medicare PPO | Admitting: Physical Therapy

## 2021-07-03 VITALS — BP 138/65 | HR 65

## 2021-07-03 DIAGNOSIS — M6281 Muscle weakness (generalized): Secondary | ICD-10-CM

## 2021-07-03 DIAGNOSIS — R2681 Unsteadiness on feet: Secondary | ICD-10-CM

## 2021-07-03 DIAGNOSIS — R293 Abnormal posture: Secondary | ICD-10-CM

## 2021-07-03 DIAGNOSIS — R2689 Other abnormalities of gait and mobility: Secondary | ICD-10-CM

## 2021-07-03 NOTE — Therapy (Signed)
OUTPATIENT PHYSICAL THERAPY TREATMENT NOTE    Patient Name: Gary [name] MRN: [id number] DOB:12/12/1932, 89 y.o., male Today's Date: 07/03/2021  PCP: Asenso, [name], MD REFERRING PROVIDER: Vikram Penumalli       PT End of Session - 07/03/21 1321     Visit Number 11    Number of Visits 25    Date for PT Re-Evaluation 07/27/21    Authorization Type [employer] medicare so 10th visit progress note. PT requested new [employer] auth on 05/30/21    Authorization Time Period 17 visits 03/01/21 through 05/03/21; awaiting new auth    Authorization - Visit Number 3    Progress Note Due on Visit 19    PT Start Time 1318    PT Stop Time 1400    PT Time Calculation (min) 42 min    Equipment Utilized During Treatment Gait belt    Activity Tolerance Patient tolerated treatment well    Behavior During Therapy WFL for tasks assessed/performed             Past Medical History:  Diagnosis Date   Adenomatous colon polyp    2010    Anemia    Anxiety    Arthritis    "hips and lower back"   Arthritis pain    Atypical mole 06/21/2003   left neck, inferior - slight to moderate atypia   Basal cell carcinoma 10/04/2014   right shin bcc (tx cx3, 5fu )   Blood transfusion    Chest pain 03/29/2011   2D Echo without contrast on 03/29/11 - EF= 55-60%.   Cholecystitis    Chronic back pain greater than 3 months duration    Chronic kidney disease    Colon polyps    Complication of anesthesia    once slow to wake up   Coronary angioplasty status 2011   Coronary artery disease    Diarrhea    Diverticulosis    Dyslipidemia    GERD (gastroesophageal reflux disease)    GI bleeding after 07/2009   "triggered by Plavix & ASA; from my diverticulosis"   H/O Clostridium difficile infection    H/O myocardial perfusion scan 04/04/2011   For Pre-noncardiac surgery - EF = 67%, no ischemia, and is considered low risk.   Heart attack (HCC) 07/2009   nonSTEMI with an occluded circumflex artery and collaterals.    Heart murmur    Hemorrhoids    History of shingles    Hyperlipidemia    Hypertension    Kidney stones    Lower extremity edema    Taking furosemide and it seems to be helping.   Neuromuscular disorder (HCC)    Nodular infiltrative basal cell carcinoma (BCC) 02/08/2021   Right Forearm-anterior (tx p bx)   Nonspecific chest pain 08/26/2012   Went to ED 08/26/12   Parkinsonism (HCC)    SCCA (squamous cell carcinoma) of skin 02/08/2021   Right Dorsal Hand (well diff) (tx p bx)   Squamous cell carcinoma of skin 05/13/2000   Right outer forehead   Squamous cell carcinoma of skin 03/02/2001   Post crown - tx 03/19/2001   Squamous cell carcinoma of skin 09/01/2001   Left post auricular - tx 10/09/2001   Squamous cell carcinoma of skin 06/21/2003   Top right ear - tx 07/05/2003   Squamous cell carcinoma of skin 06/21/2003   Right upper arm - tx 07/05/2003   Squamous cell carcinoma of skin 10/12/2003   Left superior ear lobe - MOHs   Squamous cell   carcinoma of skin 12/15/2006   Right ear, superior - tx 01/26/2007   Squamous cell carcinoma of skin 03/13/2009   Right temple - tx 05/08/2009   Squamous cell carcinoma of skin 03/24/2013   Upper left forearm - tx p bx   Squamous cell carcinoma of skin 12/06/2013   Right forearm - CX3 + 5FU   Squamous cell carcinoma of skin 02/10/2014   Right hand - tx p bx   Squamous cell carcinoma of skin 10/04/2014   Right shin - tx p bx   Squamous cell carcinoma of skin 08/21/2016   Left hand - tx p bx   Squamous cell carcinoma of skin 01/01/2017   Right hand - tx 02/06/2017   Squamous cell carcinoma of skin 10/21/2017   Left side of scalp - tx 03/12/2018   Squamous cell carcinoma of skin 10/21/2017   Left post scalp, inf - tx 08/13/2018   Squamous cell carcinoma of skin 10/21/2017   Right scalp - tx p bx   Squamous cell carcinoma of skin 10/21/2017   Left cheek - tx 03/12/2018   Squamous cell carcinoma of skin 08/13/2018   Left top hand,  proximal pinky - tx p bx   Squamous cell carcinoma of skin 12/23/2018   Right bicep lower - tx p bx   Squamous cell carcinoma of skin 05/18/2019   Right 4th finger metacarpophalangeal joint   Squamous cell carcinoma of skin 05/18/2019   Left 5th finger metacarpophalangeal joint   Squamous cell carcinoma of skin 05/18/2019   Left 2nd finger metacarpophalangeal joint   Stomach problems    Syncope and collapse 04/16/2018   Past Surgical History:  Procedure Laterality Date   [place]  08/09/2009   Circumflex 100% occluded with right-to-left collaterals, mild irregularities in the proximal and distal LAD and diagonal system, normal LV systolic function, and minor infrarenal irregularities, but no abdominal aortic aneurysm.   CHOLECYSTECTOMY  04/12/2011   Procedure: CHOLECYSTECTOMY;  Surgeon: Adin Hector, MD;  Location: [facility];  Service: General;  Laterality: N/A;   CHOLECYSTECTOMY  04/12/2011   Procedure: LAPAROSCOPIC CHOLECYSTECTOMY;  Surgeon: Adin Hector, MD;  Location: [facility];  Service: General;  Laterality: N/A;  converted to open   COLON SURGERY     COLONOSCOPY   ESOPHAGOGASTRODUODENOSCOPY N/A 08/05/2013   Procedure: ESOPHAGOGASTRODUODENOSCOPY (EGD);  Surgeon: Jerene Bears, MD;  Location: [facility];  Service: Gastroenterology;  Laterality: N/A;   EYE SURGERY  1942   "right eye was crossed; they  corrected it"   LUMBAR FUSION  07/22/2012   PACEMAKER IMPLANT N/A 04/17/2018   Procedure: PACEMAKER IMPLANT;  Surgeon: Deboraha Sprang, MD;  Location: [facility];  Service: Cardiovascular;  Laterality: N/A;   POSTERIOR FUSION LUMBAR SPINE  08/1995   "bottom 4 vertebra"   SUBTOTAL COLECTOMY  01/24/2010   TOTAL HIP ARTHROPLASTY Right 08/03/2013   Procedure: TOTAL HIP ARTHROPLASTY ANTERIOR APPROACH;  Surgeon: Hessie Dibble, MD;  Location: [facility];  Service: Orthopedics;  Laterality: Right;   Patient Active Problem List   Diagnosis Date Noted    Vitamin B12 deficiency 04/15/2019   Abnormal glucose 04/15/2019   Stage 4 chronic kidney disease ([place]) 04/15/2019   Dyspnea on exertion 04/15/2019   Pulmonary hypertension ([place]) 04/08/2019   Chest pain 03/27/2019   Complete heart block (HCC) 04/17/2018   Syncope and collapse 04/16/2018   Sinus pause 04/16/2018   Snoring 10/03/2017   Other fatigue  10/03/2017   Pre-syncope 05/14/2016   Murmur 05/14/2016   CAD in native artery 10/31/2015   History of MI (myocardial infarction) 10/31/2015   Preoperative cardiovascular examination 12/26/2014   History of colonic polyps 01/17/2014   PAF (paroxysmal atrial fibrillation) (HCC) 08/14/2013   Metabolic acidosis 08/14/2013   Acute renal failure (HCC) 08/13/2013   Stage 3b chronic kidney disease (HCC) 08/13/2013   Anemia in chronic kidney disease 08/13/2013   Hypokalemia 08/12/2013   Acute blood loss anemia 08/09/2013   Encephalopathy 08/09/2013   Severe sepsis (HCC) 08/08/2013   Hypotension, unspecified 08/07/2013   Complex renal cyst 08/07/2013   Renal mass, left 08/06/2013   Upper GI bleeding 08/05/2013   Acute esophagitis 08/05/2013   Urinary retention 08/04/2013   S/P total hip arthroplasty 08/03/2013   Degenerative joint disease (DJD) of hip 08/03/2013    Class: Chronic   Internal hemorrhoids with other complication 05/12/2013   Tinnitus 10/22/2012   C. difficile colitis 07/26/2012   Ileus, postoperative (HCC) 07/24/2012   Somnolence 07/24/2012   Anxiety state 07/24/2012   Chest pain- ER 08/26/12 03/30/2011   Fever 03/28/2011   Leucocytosis 03/28/2011   CKD (chronic kidney disease), stage III (HCC) 03/28/2011   Hyponatremia 02/02/2011   BPH (benign prostatic hyperplasia) 02/01/2011   Cholecystitis chronic, acute 01/26/2011   Dyslipidemia 01/22/2011   Abdominal bloating 08/07/2010   Loose stools 08/07/2010   Coronary atherosclerosis 10/26/2009   Essential hypertension 06/07/2009   DIVERTICULOSIS OF COLON 10/31/2008    DIVERTICULOSIS, COLON, WITH HEMORRHAGE, surg 12/11  10/31/2008   Personal history of colonic polyps 10/31/2008    REFERRING DIAG: abnormality of gait and mobilty   THERAPY DIAG:  Other abnormalities of gait and mobility  Muscle weakness (generalized)  Unsteadiness on feet  Abnormal posture  PERTINENT HISTORY: anxiety, arthritis, basal cell carcinoma, back pain, chronic kidney disease, CAD, MI, heart murmur, HTN, lumbar fusion, pacemaker, right THR, mild memory loss   PRECAUTIONS: Fall;Pacemaker   SUBJECTIVE: No falls since last visit.  Pt discusses ongoing difficulties with mobility and expresses not wanting to do as much challenging activity today due to not feeling well.   PAIN:  Are you having pain? Yes: NPRS scale: 2-3/10 Pain location: low back Pain description: aching Aggravating factors: standing Relieving factors: sitting    VITALS:     Today's Vitals   07/03/21 1325  BP: 138/65  Pulse: 65    TODAY'S TREATMENT:  STRENGTHENING  NuStep UE/LE's level 5 x 8 minutes with goal >/= 65 steps per minute for strengthening, ROM, and activity tolerance.  Min verbal cuing for improved LE ROM.  TUG assessed w/ RW:  (1st attempt) 53.02 seconds w/ inc one step commands (2nd attempt) 48.30 seconds 2 min rest between attempts  NMR: Forwards tandem walking 3x10' and backwards walking 3x10' in // bars, inc one step commands for trunk posture and decreased visualization of stepping, inc need for bilateral UE support, attempted at counter with unilateral support w/ pt increasingly unstable.  Increased time to complete task  PATIENT EDUCATION: Education details: Continue HEP. Person educated: Patient Education method: Explanation Education comprehension: verbalized understanding   HOME EXERCISE PROGRAM: Access Code: [email or web address] URL: [email or web address] Date: 06/28/2021 Prepared by: Kathy Bury  Exercises - Sit to Stand with Armchair  - 1 x daily - 5 x  weekly - 1 sets - 10 reps - Seated Hamstring Stretch  - 1 x daily - 5 x weekly - 1 sets - 3 reps - 30 hold - Heel Raises   with Counter Support  - 1 x daily - 5 x weekly - 1 sets - 10 reps - Standing March with Counter Support  - 1 x daily - 5 x weekly - 1 sets - 10 reps - Standing Hip Abduction with Counter Support  - 1 x daily - 5 x weekly - 1 sets - 10 reps - Standing Knee Flexion with Counter Support  - 1 x daily - 5 x weekly - 1 sets - 10 reps   GOALS  SHORT TERM GOALS:   Target date: 06/28/2021  Pt will be able to perform progressive HEP for strengthening and balance to continue gains on own. Baseline: 06/28/21- reviewed old program and added new ones today Goal status: MET  2.  Pt will decrease TUG from 47.99 sec with cane to <40 sec with cane for improved balance and functional mobility. Baseline: 05/30/21 47.99 sec with cane;  48.30 seconds w/ RW Goal status: NOT MET  3.  Pt will be able to ambulate 500' on level surfaces with RW mod I for improved household and short community distances. Baseline: 06/28/21: 230 feet with supervision, increased distance just not to goal level Goal status: PARTIALLY MET    LONG TERM GOALS:  Target date:  07/27/21  Pt will increase Berg Balance score from 41 to 45 for improved balance and decreased fall risk. Baseline: 05/30/21 41/56 Goal status: INITIAL  2.  Pt will increase gait speed from 0.10ms to >0.547m for improved household and community mobility. Baseline: 05/30/21 0.3216mwith RW Goal status: INITIAL  3.  Pt will ambulate 200' with cane mod I on level surfaces for improved household mobility. Baseline: currently uses RW Goal status: INITIAL  4.  Pt will ambulate >800' on varied surfaces with RW mod I for improved community mobility. Baseline:  Goal status: INITIAL  5.  Pt will be report decrease in low back/right hip pain to 6/10 at worst with activities.  Baseline: 05/30/21 7-8/10 at worst Goal status: INITIAL     Plan -  05/31/21 0839     Clinical Impression Statement Assessed TUG today with use of RW with pt requiring 2 attempts to complete due to difficulty following multi-step commands.  Second attempt was completed in 48.30 seconds.  Remainder of session focused on addressing narrowed BOS, backwards walking, and general LE strengthening on the [employer].  Pt remains limited by decreased activity tolerance and static and dynamic balance deficits without UE support.  Will continue to address deficits in order to progress towards goals.   Personal Factors and Comorbidities Comorbidity 3+;Age    Comorbidities anxiety, arthritis, basal cell carcinoma, back pain, chronic kidney disease, CAD, MI, heart murmur, HTN, lumbar fusion, pacemaker, right THR, mild memory loss    Examination-Activity Limitations Locomotion Level;Transfers;Stand;Stairs;Squat    Examination-Participation Restrictions Cleaning;Meal Prep;Community Activity    Stability/Clinical Decision Making Evolving/Moderate complexity    Rehab Potential Good    PT Frequency 2x / week    PT Duration 8 weeks    PT Treatment/Interventions ADLs/Self Care Home Management;DME Instruction;Cryotherapy;Moist Heat;Gait training;Stair training;Functional mobility training;Therapeutic activities;Therapeutic exercise;Balance training;Neuromuscular re-education;Manual techniques;Passive range of motion;Vestibular;Patient/family education    PT Next Visit Plan Work on RW [employer] step transfers, obstacle approach. Continue to work on LE/core strengthening, gaiPersonnel officerd balance training with decreased UE support on solid surface progressing to compliant surfaces as able.  SLS, glut strengthening.   PT Home Exercise Plan Access Code: [email or web address]  Elease Etienne, PT, DPT [facility] [address], [place], [place] [address] [phone] 07/03/21, 5:36 PM

## 2021-07-05 ENCOUNTER — Ambulatory Visit: Payer: Medicare PPO | Admitting: Physical Therapy

## 2021-07-05 ENCOUNTER — Encounter: Payer: Self-pay | Admitting: Physical Therapy

## 2021-07-05 DIAGNOSIS — R293 Abnormal posture: Secondary | ICD-10-CM

## 2021-07-05 DIAGNOSIS — M6281 Muscle weakness (generalized): Secondary | ICD-10-CM

## 2021-07-05 DIAGNOSIS — R2689 Other abnormalities of gait and mobility: Secondary | ICD-10-CM

## 2021-07-05 DIAGNOSIS — R2681 Unsteadiness on feet: Secondary | ICD-10-CM

## 2021-07-05 NOTE — Therapy (Signed)
OUTPATIENT PHYSICAL THERAPY TREATMENT NOTE    Patient Name: FOREST REDWINE MRN: [id number] DOB:[birth year], 88 y.o., male Today's Date: 07/05/2021  PCP: Clovia Cuff, MD REFERRING PROVIDER: Andrey Spearman       PT End of Session - 07/05/21 1407     Visit Number 12    Number of Visits 25    Date for PT Re-Evaluation 07/27/21    Authorization Type human[employer] medicare so 10th visit progress note. PT requested new [employer] auth on 05/30/21    Authorization Time Period 17 visits 03/01/21 through 05/03/21; awaiting new auth    Authorization - Visit Number 5    Progress Note Due on Visit 19    PT Start Time 1405    PT Stop Time 1445    PT Time Calculation (min) 40 min    Equipment Utilized During Treatment --    Activity Tolerance Patient tolerated treatment well    Behavior During Therapy WFL for tasks assessed/performed             Past Medical History:  Diagnosis Date   Adenomatous colon polyp    2010    Anemia    Anxiety    Arthritis    "hips and lower back"   Arthritis pain    Atypical mole 06/21/2003   left neck, inferior - slight to moderate atypia   Basal cell carcinoma 10/04/2014   right shin bcc (tx cx3, 100fu )   Blood transfusion    Chest pain 03/29/2011   2D Echo without contrast on 03/29/11 - EF= 55-60%.   Cholecystitis    Chronic back pain greater than 3 months duration    Chronic kidney disease    Colon polyps    Complication of anesthesia    once slow to wake up   Coronary angioplasty status 2011   Coronary artery disease    Diarrhea    Diverticulosis    Dyslipidemia    GERD (gastroesophageal reflux disease)    GI bleeding after 07/2009   "triggered by Plavix & ASA; from my diverticulosis"   H/O Clostridium difficile infection    H/O myocardial perfusion scan 04/04/2011   For Pre-noncardiac surgery - EF = 67%, no ischemia, and is considered low risk.   Heart attack ([place]) 07/2009   nonSTEMI with an occluded circumflex artery and collaterals.    Heart murmur    Hemorrhoids    History of shingles    Hyperlipidemia    Hypertension    Kidney stones    Lower extremity edema    Taking furosemide and it seems to be helping.   Neuromuscular disorder ([place])    Nodular infiltrative basal cell carcinoma (BCC) 02/08/2021   Right Forearm-anterior (tx p bx)   Nonspecific chest pain 08/26/2012   Went to ED 08/26/12   Parkinsonism ([place])    SCCA (squamous cell carcinoma) of skin 02/08/2021   Right Dorsal Hand (well diff) (tx p bx)   Squamous cell carcinoma of skin 05/13/2000   Right outer forehead   Squamous cell carcinoma of skin 03/02/2001   Post crown - tx 03/19/2001   Squamous cell carcinoma of skin 09/01/2001   Left post auricular - tx 10/09/2001   Squamous cell carcinoma of skin 06/21/2003   Top right ear - tx 07/05/2003   Squamous cell carcinoma of skin 06/21/2003   Right upper arm - tx 07/05/2003   Squamous cell carcinoma of skin 10/12/2003   Left superior ear lobe - MOHs   Squamous cell carcinoma  of skin 12/15/2006   Right ear, superior - tx 01/26/2007   Squamous cell carcinoma of skin 03/13/2009   Right temple - tx 05/08/2009   Squamous cell carcinoma of skin 03/24/2013   Upper left forearm - tx p bx   Squamous cell carcinoma of skin 12/06/2013   Right forearm - CX3 + 5FU   Squamous cell carcinoma of skin 02/10/2014   Right hand - tx p bx   Squamous cell carcinoma of skin 10/04/2014   Right shin - tx p bx   Squamous cell carcinoma of skin 08/21/2016   Left hand - tx p bx   Squamous cell carcinoma of skin 01/01/2017   Right hand - tx 02/06/2017   Squamous cell carcinoma of skin 10/21/2017   Left side of scalp - tx 03/12/2018   Squamous cell carcinoma of skin 10/21/2017   Left post scalp, inf - tx 08/13/2018   Squamous cell carcinoma of skin 10/21/2017   Right scalp - tx p bx   Squamous cell carcinoma of skin 10/21/2017   Left cheek - tx 03/12/2018   Squamous cell carcinoma of skin 08/13/2018   Left top hand,  proximal pinky - tx p bx   Squamous cell carcinoma of skin 12/23/2018   Right bicep lower - tx p bx   Squamous cell carcinoma of skin 05/18/2019   Right 4th finger metacarpophalangeal joint   Squamous cell carcinoma of skin 05/18/2019   Left 5th finger metacarpophalangeal joint   Squamous cell carcinoma of skin 05/18/2019   Left 2nd finger metacarpophalangeal joint   Stomach problems    Syncope and collapse 04/16/2018   Past Surgical History:  Procedure Laterality Date   [place]  08/09/2009   Circumflex 100% occluded with right-to-left collaterals, mild irregularities in the proximal and distal LAD and diagonal system, normal LV systolic function, and minor infrarenal irregularities, but no abdominal aortic aneurysm.   CHOLECYSTECTOMY  04/12/2011   Procedure: CHOLECYSTECTOMY;  Surgeon: Adin Hector, MD;  Location: [facility];  Service: General;  Laterality: N/A;   CHOLECYSTECTOMY  04/12/2011   Procedure: LAPAROSCOPIC CHOLECYSTECTOMY;  Surgeon: Adin Hector, MD;  Location: [facility];  Service: General;  Laterality: N/A;  converted to open   COLON SURGERY     COLONOSCOPY   ESOPHAGOGASTRODUODENOSCOPY N/A 08/05/2013   Procedure: ESOPHAGOGASTRODUODENOSCOPY (EGD);  Surgeon: Jerene Bears, MD;  Location: [facility];  Service: Gastroenterology;  Laterality: N/A;   EYE SURGERY  1942   "right eye was crossed; they  corrected it"   LUMBAR FUSION  07/22/2012   PACEMAKER IMPLANT N/A 04/17/2018   Procedure: PACEMAKER IMPLANT;  Surgeon: Deboraha Sprang, MD;  Location: [facility];  Service: Cardiovascular;  Laterality: N/A;   POSTERIOR FUSION LUMBAR SPINE  08/1995   "bottom 4 vertebra"   SUBTOTAL COLECTOMY  01/24/2010   TOTAL HIP ARTHROPLASTY Right 08/03/2013   Procedure: TOTAL HIP ARTHROPLASTY ANTERIOR APPROACH;  Surgeon: Hessie Dibble, MD;  Location: [facility];  Service: Orthopedics;  Laterality: Right;   Patient Active Problem List   Diagnosis Date Noted    Vitamin B12 deficiency 04/15/2019   Abnormal glucose 04/15/2019   Stage 4 chronic kidney disease ([place]) 04/15/2019   Dyspnea on exertion 04/15/2019   Pulmonary hypertension ([place]) 04/08/2019   Chest pain 03/27/2019   Complete heart block ([place]) 04/17/2018   Syncope and collapse 04/16/2018   Sinus pause 04/16/2018   Snoring 10/03/2017   Other fatigue 10/03/2017  Pre-syncope 05/14/2016   Murmur 05/14/2016   CAD in native artery 10/31/2015   History of MI (myocardial infarction) 10/31/2015   Preoperative cardiovascular examination 12/26/2014   History of colonic polyps 01/17/2014   PAF (paroxysmal atrial fibrillation) (HCC) 97/35/3299   Metabolic acidosis 24/26/8341   Acute renal failure ([place]) 08/13/2013   Stage 3b chronic kidney disease ([place]) 08/13/2013   Anemia in chronic kidney disease 08/13/2013   Hypokalemia 08/12/2013   Acute blood loss anemia 08/09/2013   Encephalopathy 08/09/2013   Severe sepsis ([place]) 08/08/2013   Hypotension, unspecified 08/07/2013   Complex renal cyst 08/07/2013   Renal mass, left 08/06/2013   Upper GI bleeding 08/05/2013   Acute esophagitis 08/05/2013   Urinary retention 08/04/2013   S/P total hip arthroplasty 08/03/2013   Degenerative joint disease (DJD) of hip 08/03/2013    Class: Chronic   Internal hemorrhoids with other complication 96/22/2979   Tinnitus 10/22/2012   C. difficile colitis 07/26/2012   Ileus, postoperative ([place]) 07/24/2012   Somnolence 07/24/2012   Anxiety state 07/24/2012   Chest pain- ER 08/26/12 03/30/2011   Fever 03/28/2011   Leucocytosis 03/28/2011   CKD (chronic kidney disease), stage III ([place]) 03/28/2011   Hyponatremia 02/02/2011   BPH (benign prostatic hyperplasia) 02/01/2011   Cholecystitis chronic, acute 01/26/2011   Dyslipidemia 01/22/2011   Abdominal bloating 08/07/2010   Loose stools 08/07/2010   Coronary atherosclerosis 10/26/2009   Essential hypertension 06/07/2009   DIVERTICULOSIS OF COLON 10/31/2008    DIVERTICULOSIS, COLON, WITH HEMORRHAGE, surg 12/11  10/31/2008   Personal history of colonic polyps 10/31/2008    REFERRING DIAG: abnormality of gait and mobilty   THERAPY DIAG:  Other abnormalities of gait and mobility  Unsteadiness on feet  Muscle weakness (generalized)  Abnormal posture  PERTINENT HISTORY: anxiety, arthritis, basal cell carcinoma, back pain, chronic kidney disease, CAD, MI, heart murmur, HTN, lumbar fusion, pacemaker, right THR, mild memory loss   PRECAUTIONS: Fall;Pacemaker   SUBJECTIVE: No new complaints. No falls.   PAIN:  Are you having pain? Yes: NPRS scale: 5/10 Pain location: low back Pain description: aching Aggravating factors: standing Relieving factors: sitting       TODAY'S TREATMENT:  07/05/2021 STRENGTHENING  NuStep UE/LE's level 5 x 8 minutes with goal >/= 65 steps per minute for strengthening, ROM, and activity tolerance.  Min verbal cuing for improved LE ROM.   GAIT: Gait pattern: step through pattern, decreased stride length, and trunk flexed Distance walked: 230 x 1, plus around clinic with session Assistive device utilized: Walker - 2 wheeled Level of assistance:  min guard assist to supervision Comments: verbal cues for posture, increased step length and walker position with turns    NMR: Obstacle Negotiation: 2 cones spaced apart <> 2 black bolsters spaced apart- weaving around cones <> stepping over bolsters with RW with cues on stepping pattern to stay within walker and sequencing with stepping over bolsters. Min guard assist for safety. Cues to get closer to bolster before advancing walker over it and to for technique with walker for smaller 180* turns at each end of obstacle course. (Pt trying to keep the wheels rolling straight, did not know she could slide them with the turn).     PATIENT EDUCATION: Education details: Continue with current HEP Person educated: Patient Education method: Explanation Education  comprehension: verbalized understanding   HOME EXERCISE PROGRAM: Access Code: [facility] URL: [email or web address] Date: 06/28/2021 Prepared by: Willow Ora  Exercises - Sit to Stand with Armchair  - 1 x daily -  5 x weekly - 1 sets - 10 reps - Seated Hamstring Stretch  - 1 x daily - 5 x weekly - 1 sets - 3 reps - 30 hold - Heel Raises with Counter Support  - 1 x daily - 5 x weekly - 1 sets - 10 reps - Standing March with Counter Support  - 1 x daily - 5 x weekly - 1 sets - 10 reps - Standing Hip Abduction with Counter Support  - 1 x daily - 5 x weekly - 1 sets - 10 reps - Standing Knee Flexion with Counter Support  - 1 x daily - 5 x weekly - 1 sets - 10 reps   GOALS  SHORT TERM GOALS:   Target date: 06/28/2021  Pt will be able to perform progressive HEP for strengthening and balance to continue gains on own. Baseline: 06/28/21- reviewed old program and added new ones today Goal status: MET  2.  Pt will decrease TUG from 47.99 sec with cane to <40 sec with cane for improved balance and functional mobility. Baseline: 05/30/21 47.99 sec with cane;  48.30 seconds w/ RW Goal status: NOT MET  3.  Pt will be able to ambulate 500' on level surfaces with RW mod I for improved household and short community distances. Baseline: 06/28/21: 230 feet with supervision, increased distance just not to goal level Goal status: PARTIALLY MET    LONG TERM GOALS:  Target date:  07/27/21  Pt will increase Berg Balance score from 41 to 45 for improved balance and decreased fall risk. Baseline: 05/30/21 41/56 Goal status: INITIAL  2.  Pt will increase gait speed from 0.64m/s to >0.52m/s for improved household and community mobility. Baseline: 05/30/21 0.50m/s with RW Goal status: INITIAL  3.  Pt will ambulate 200' with cane mod I on level surfaces for improved household mobility. Baseline: currently uses RW Goal status: INITIAL  4.  Pt will ambulate >800' on varied surfaces with RW mod I  for improved community mobility. Baseline:  Goal status: INITIAL  5.  Pt will be report decrease in low back/right hip pain to 6/10 at worst with activities.  Baseline: 05/30/21 7-8/10 at worst Goal status: INITIAL     Plan - 05/31/21 0839     Clinical Impression Statement Today's skilled session continue to focus on strengthening, activity tolerance and obstacle negotiation with walker with no issues noted or reported in session. Pt able to follow all directions with no issues this session. The pt is progressing toward goals and should benefit from continued PT to progress toward unmet goals.    Personal Factors and Comorbidities Comorbidity 3+;Age    Comorbidities anxiety, arthritis, basal cell carcinoma, back pain, chronic kidney disease, CAD, MI, heart murmur, HTN, lumbar fusion, pacemaker, right THR, mild memory loss    Examination-Activity Limitations Locomotion Level;Transfers;Stand;Stairs;Squat    Examination-Participation Restrictions Cleaning;Meal Prep;Community Activity    Stability/Clinical Decision Making Evolving/Moderate complexity    Rehab Potential Good    PT Frequency 2x / week    PT Duration 8 weeks    PT Treatment/Interventions ADLs/Self Care Home Management;DME Instruction;Cryotherapy;Moist Heat;Gait training;Stair training;Functional mobility training;Therapeutic activities;Therapeutic exercise;Balance training;Neuromuscular re-education;Manual techniques;Passive range of motion;Vestibular;Patient/family education    PT Next Visit Plan Work on [employer], stand step transfers, obstacle approach. Continue to work on LE/core strengthening, Personnel officer and balance training with decreased UE support on solid surface progressing to compliant surfaces as able.  SLS, glut strengthening.   PT Home Exercise Plan Access Code: [facility]  Willow Ora, PTA, [place] [address], [place], [place]  [id number] [phone] 07/05/21, 2:56 PM

## 2021-07-05 NOTE — Progress Notes (Signed)
Remote pacemaker transmission.   

## 2021-07-09 NOTE — Progress Notes (Unsigned)
Office Visit    Patient Name: ZAKERY NORMINGTON Date of Encounter: 07/10/2021  Primary Care Provider:  Clovia Cuff, MD Primary Cardiologist:  Pixie Casino, MD  Chief Complaint    88 year old male with a history of CAD, CHB s/p Medtronic PPM, syncope, hypertension, hyperlipidemia, CKD stage III, OSA, squamous cell carcinoma, basal cell carcinoma, parkinsonism, arthritis, and GERD who presents for follow-up related to CAD and bilateral lower extremity edema.  Past Medical History    Past Medical History:  Diagnosis Date   Adenomatous colon polyp    2010    Anemia    Anxiety    Arthritis    "hips and lower back"   Arthritis pain    Atypical mole 06/21/2003   left neck, inferior - slight to moderate atypia   Basal cell carcinoma 10/04/2014   right shin bcc (tx cx3, 66fu )   Blood transfusion    Chest pain 03/29/2011   2D Echo without contrast on 03/29/11 - EF= 55-60%.   Cholecystitis    Chronic back pain greater than 3 months duration    Chronic kidney disease    Colon polyps    Complication of anesthesia    once slow to wake up   Coronary angioplasty status 2011   Coronary artery disease    Diarrhea    Diverticulosis    Dyslipidemia    GERD (gastroesophageal reflux disease)    GI bleeding after 07/2009   "triggered by Plavix & ASA; from my diverticulosis"   H/O Clostridium difficile infection    H/O myocardial perfusion scan 04/04/2011   For Pre-noncardiac surgery - EF = 67%, no ischemia, and is considered low risk.   Heart attack ([place]) 07/2009   nonSTEMI with an occluded circumflex artery and collaterals.   Heart murmur    Hemorrhoids    History of shingles    Hyperlipidemia    Hypertension    Kidney stones    Lower extremity edema    Taking furosemide and it seems to be helping.   Neuromuscular disorder ([place])    Nodular infiltrative basal cell carcinoma (BCC) 02/08/2021   Right Forearm-anterior (tx p bx)   Nonspecific chest pain 08/26/2012   Went to  ED 08/26/12   Parkinsonism ([place])    SCCA (squamous cell carcinoma) of skin 02/08/2021   Right Dorsal Hand (well diff) (tx p bx)   Squamous cell carcinoma of skin 05/13/2000   Right outer forehead   Squamous cell carcinoma of skin 03/02/2001   Post crown - tx 03/19/2001   Squamous cell carcinoma of skin 09/01/2001   Left post auricular - tx 10/09/2001   Squamous cell carcinoma of skin 06/21/2003   Top right ear - tx 07/05/2003   Squamous cell carcinoma of skin 06/21/2003   Right upper arm - tx 07/05/2003   Squamous cell carcinoma of skin 10/12/2003   Left superior ear lobe - MOHs   Squamous cell carcinoma of skin 12/15/2006   Right ear, superior - tx 01/26/2007   Squamous cell carcinoma of skin 03/13/2009   Right temple - tx 05/08/2009   Squamous cell carcinoma of skin 03/24/2013   Upper left forearm - tx p bx   Squamous cell carcinoma of skin 12/06/2013   Right forearm - CX3 + 5FU   Squamous cell carcinoma of skin 02/10/2014   Right hand - tx p bx   Squamous cell carcinoma of skin 10/04/2014   Right shin - tx p bx   Squamous  cell carcinoma of skin 08/21/2016   Left hand - tx p bx   Squamous cell carcinoma of skin 01/01/2017   Right hand - tx 02/06/2017   Squamous cell carcinoma of skin 10/21/2017   Left side of scalp - tx 03/12/2018   Squamous cell carcinoma of skin 10/21/2017   Left post scalp, inf - tx 08/13/2018   Squamous cell carcinoma of skin 10/21/2017   Right scalp - tx p bx   Squamous cell carcinoma of skin 10/21/2017   Left cheek - tx 03/12/2018   Squamous cell carcinoma of skin 08/13/2018   Left top hand, proximal pinky - tx p bx   Squamous cell carcinoma of skin 12/23/2018   Right bicep lower - tx p bx   Squamous cell carcinoma of skin 05/18/2019   Right 4th finger metacarpophalangeal joint   Squamous cell carcinoma of skin 05/18/2019   Left 5th finger metacarpophalangeal joint   Squamous cell carcinoma of skin 05/18/2019   Left 2nd finger  metacarpophalangeal joint   Stomach problems    Syncope and collapse 04/16/2018   Past Surgical History:  Procedure Laterality Date   [place]  08/09/2009   Circumflex 100% occluded with right-to-left collaterals, mild irregularities in the proximal and distal LAD and diagonal system, normal LV systolic function, and minor infrarenal irregularities, but no abdominal aortic aneurysm.   CHOLECYSTECTOMY  04/12/2011   Procedure: CHOLECYSTECTOMY;  Surgeon: Adin Hector, MD;  Location: [facility];  Service: General;  Laterality: N/A;   CHOLECYSTECTOMY  04/12/2011   Procedure: LAPAROSCOPIC CHOLECYSTECTOMY;  Surgeon: Adin Hector, MD;  Location: [facility];  Service: General;  Laterality: N/A;  converted to open   COLON SURGERY     COLONOSCOPY   ESOPHAGOGASTRODUODENOSCOPY N/A 08/05/2013   Procedure: ESOPHAGOGASTRODUODENOSCOPY (EGD);  Surgeon: Jerene Bears, MD;  Location: [facility];  Service: Gastroenterology;  Laterality: N/A;   EYE SURGERY  1942   "right eye was crossed; they  corrected it"   LUMBAR FUSION  07/22/2012   PACEMAKER IMPLANT N/A 04/17/2018   Procedure: PACEMAKER IMPLANT;  Surgeon: Deboraha Sprang, MD;  Location: [facility];  Service: Cardiovascular;  Laterality: N/A;   POSTERIOR FUSION LUMBAR SPINE  08/1995   "bottom 4 vertebra"   SUBTOTAL COLECTOMY  01/24/2010   TOTAL HIP ARTHROPLASTY Right 08/03/2013   Procedure: TOTAL HIP ARTHROPLASTY ANTERIOR APPROACH;  Surgeon: Hessie Dibble, MD;  Location: [facility];  Service: Orthopedics;  Laterality: Right;    Allergies  Allergies  Allergen Reactions   Naproxen Rash   Clopidogrel Bisulfate Other (See Comments)    H/O diverticulitis; prone to rectal bleeding.  Plavix complicated this.  jkl   Sulfonamide Derivatives Hives   Other Other (See Comments)    Narcotics-C. Diff   Latex Rash   Tape Itching and Rash    Latex-Adhesive tape    History of Present Illness    88 year old male with the above  past medical history including CAD, CHB s/p Medtronic PPM, syncope, hypertension, hyperlipidemia, CKD stage III, OSA, squamous cell carcinoma, basal cell carcinoma, parkinsonism, arthritis, and GERD.  History of CAD s/p NSTEMI in 2011 with occluded left circumflex with collaterals, managed medically.  Echocardiogram 2020 showed EF 60 to 29%, grade 1 diastolic dysfunction, no regional wall abnormality, mild ascending aortic aneurysm measuring 37 mm.  Most recent myoview in 2021 in the setting of chest discomfort showed evidence of previous MI, no reversible ischemia, EF 44%.  Repeat echocardiogram in March 2021 showed EF 55 to 60%, mild MR, ascending aorta measuring 42 mm.,  He has a history of complete heart block s/p PPM.  Renal ultrasound in September 2021 showed cortical thinning with increased echogenicity within the parenchyma compatible with medical disease.  Most recent echocardiogram in May 2022 in the setting of shortness of breath showed EF 55 to 60%, no RWMA, indeterminate LV diastolic function, normal RV systolic function, mild to moderate aortic valve sclerosis, without any evidence of aortic valve stenosis, mild dilation of ascending aorta, 41 mm.  He was last seen in the office on 11/24/2020 and was stable from a cardiac standpoint. He called our office on 06/25/2021 and reported increased dyspnea on exertion, bilateral lower extremity edema.   He presents today for follow-up.  Since he contacted our office and since his last visit he has been stable from a cardiac standpoint.  Over the past several weeks though he has noticed an increase in bilateral lower extremity edema though he denies weight gain, dyspnea, PND, orthopnea.  Denies symptoms concerning for angina.  He states he has been drinking a significant amount of water recently, he also reports some dietary indiscretion.  He states he took his Lasix every day for 1 week, however, he states he did not tolerate this as he felt "washed out."   His swelling did improve with increased Lasix dosing.  He mentions he is getting ready to begin outpatient neuro rehab PT.  Other than his lower extremity edema, he denies any additional concerns today.  Home Medications    Current Outpatient Medications  Medication Sig Dispense Refill   acetaminophen (TYLENOL) 500 MG tablet Take 1,000 mg by mouth every 8 (eight) hours as needed for fever.     amLODipine (NORVASC) 5 MG tablet TAKE 1 TABLET BY MOUTH EVERY DAY 90 tablet 3   aspirin EC 81 MG tablet Take 81 mg by mouth daily.     blood glucose meter kit and supplies KIT Dispense based on patient and insurance preference. Use up to four times daily as directed. (FOR ICD-9 250.00, 250.01). 1 each 0   busPIRone (BUSPAR) 10 MG tablet      carvedilol (COREG) 3.125 MG tablet TAKE 1 TABLET (3.125 MG TOTAL) BY MOUTH 2 (TWO) TIMES DAILY WITH A MEAL. 180 tablet 1   citalopram (CELEXA) 40 MG tablet Take 1 tablet (40 mg total) by mouth daily. 90 tablet 1   diclofenac Sodium (VOLTAREN) 1 % GEL APPLY 2 G TO AFFECTED AREA 4 TIMES A DAY 200 g 0   doxazosin (CARDURA) 4 MG tablet Take 4 mg by mouth daily.     ergocalciferol (VITAMIN D2) 1.25 MG (50000 UT) capsule ergocalciferol (vitamin D2) 1,250 mcg (50,000 unit) capsule  TAKE 1 CAPSULE BY MOUTH ONE TIME PER WEEK     fluticasone (FLONASE) 50 MCG/ACT nasal spray Place 2 sprays into both nostrils daily.     furosemide (LASIX) 40 MG tablet Take 1 tablet (40 mg total) by mouth daily. Monday,wednesday and friday     mupirocin ointment (BACTROBAN) 2 % Apply 1 application topically 2 (two) times daily. 22 g 2   nitroGLYCERIN (NITROSTAT) 0.4 MG SL tablet PLACE 1 TABLET UNDER THE TONGUE EVERY 5 MINUTES AS NEEDED FOR CHEST PAIN FOR 3 DOSES (Patient taking differently: Place 0.4 mg under the tongue every 5 (five) minutes as needed for chest pain.) 25 tablet 0   nystatin (MYCOSTATIN/NYSTOP) powder Apply topically.     pregabalin (LYRICA)  75 MG capsule Take 1 capsule (75 mg  total) by mouth 3 (three) times daily. 90 capsule 3   simvastatin (ZOCOR) 40 MG tablet TAKE 1 TABLET BY MOUTH AT BEDTIME (Patient taking differently: Take 40 mg by mouth at bedtime.) 90 tablet 2   Tamsulosin HCl (FLOMAX) 0.4 MG CAPS Take 0.4 mg by mouth daily after supper.     traMADol (ULTRAM) 50 MG tablet      vitamin B-12 1000 MCG tablet Take 1 tablet (1,000 mcg total) by mouth daily. 3 tablet    No current facility-administered medications for this visit.     Review of Systems    He denies chest pain, palpitations, dyspnea, pnd, orthopnea, n, v, dizziness, syncope, weight gain, or early satiety. All other systems reviewed and are otherwise negative except as noted above.   Physical Exam    VS:  BP 126/70   Pulse 69   Ht $R'5\' 3"'kQ$  (1.6 m)   Wt 207 lb 6.4 oz (94.1 kg)   SpO2 95%   BMI 36.74 kg/m   GEN: Well nourished, well developed, in no acute distress. HEENT: normal. Neck: Supple, no JVD, carotid bruits, or masses. Cardiac: RRR, no murmurs, rubs, or gallops. No clubbing, cyanosis, non-pitting bilateral lower extremity edema.  Radials/DP/PT 2+ and equal bilaterally.  Respiratory:  Respirations regular and unlabored, clear to auscultation bilaterally. GI: Soft, nontender, nondistended, BS + x 4. MS: no deformity or atrophy. Skin: warm and dry, no rash. Neuro:  Strength and sensation are intact. Psych: Normal affect.  Accessory Clinical Findings    ECG personally reviewed by me today -ventricular paced, 69 bpm- no acute changes.  Lab Results  Component Value Date   WBC 6.8 08/15/2020   HGB 13.1 08/15/2020   HCT 40.5 08/15/2020   MCV 91.8 08/15/2020   PLT 157 08/15/2020   Lab Results  Component Value Date   CREATININE 1.58 (H) 08/15/2020   BUN 19 08/15/2020   NA 136 08/15/2020   K 3.8 08/15/2020   CL 100 08/15/2020   CO2 29 08/15/2020   Lab Results  Component Value Date   ALT 12 08/15/2020   AST 22 08/15/2020   ALKPHOS 30 (L) 08/15/2020   BILITOT 1.0 08/15/2020    Lab Results  Component Value Date   CHOL  08/09/2009    164        ATP III CLASSIFICATION:  <200     mg/dL   Desirable  200-239  mg/dL   Borderline High  >=240    mg/dL   High          HDL 36 (L) 08/09/2009   LDLCALC (H) 08/09/2009    105        Total Cholesterol/HDL:CHD Risk Coronary Heart Disease Risk Table                     Men   Women  1/2 Average Risk   3.4   3.3  Average Risk       5.0   4.4  2 X Average Risk   9.6   7.1  3 X Average Risk  23.4   11.0        Use the calculated Patient Ratio above and the CHD Risk Table to determine the patient's CHD Risk.        ATP III CLASSIFICATION (LDL):  <100     mg/dL   Optimal  100-129  mg/dL   Near or Above  Optimal  130-159  mg/dL   Borderline  160-189  mg/dL   High  >190     mg/dL   Very High   TRIG 114 08/09/2009   CHOLHDL 4.6 08/09/2009    Lab Results  Component Value Date   HGBA1C 7.0 (H) 07/14/2019    Assessment & Plan    1. Bilateral lower extremity edema: Most recent echo in May 2022 in the setting of shortness of breath showed EF 55 to 60%, no RWMA, indeterminate LV diastolic function, normal RV systolic function, mild to moderate aortic valve sclerosis, without any evidence of aortic valve stenosis, mild dilation of ascending aorta, 41 mm. He does have mild non-pitting bilateral lower extremity edema, otherwise euvolemic and well compensated on exam.  He took his Lasix daily for a week but did not seem to tolerate this as he states he felt "washed out."  Will check BMET today.  Discussed fluid and sodium recommendations. Continue carvedilol, Lasix 40 mg daily on Monday Wednesday Friday. He may take an additional Lasix 40 mg daily on Tuesday, Thursday, Saturday, and Sunday as needed for weight gain, worsening edema, or shortness of breath.   2. CAD: S/p NSTEMI in 2011 with occluded left circumflex with collaterals, managed medically.  Stable with no anginal symptoms. No indication for ischemic  evaluation.  Continue amlodipine, aspirin, carvedilol, Lasix, and simvastatin.  3. CHB: S/p PPM. Most recent PPM interrogation showed normal device function.   4. Hypertension: BP well controlled. Continue current antihypertensive regimen.   5. Hyperlipidemia: No recent LDL on file. He states he recently had labs drawn and will send them to our office.  Continue simvastatin.  6. Dilation of ascending aorta: Measured 41 mm on echocardiogram in May 2022.  BP well-controlled.  Will check CT aorta/chest for ongoing monitoring.   7. CKD stage III: Creatinine was 1.58 in 08/2020.  Follows with nephrology.  8. OSA: Adherent to CPAP, follows with Dr. Radford Pax.   9. Parkinsonism: Follows with neurology.  Getting ready to begin outpatient neuro rehab PT.   10. Disposition: Follow-up in 3 months.   Lenna Sciara, NP 07/10/2021, 12:36 PM

## 2021-07-10 ENCOUNTER — Ambulatory Visit: Payer: Medicare PPO | Admitting: Nurse Practitioner

## 2021-07-10 ENCOUNTER — Encounter: Payer: Self-pay | Admitting: Nurse Practitioner

## 2021-07-10 ENCOUNTER — Ambulatory Visit: Payer: Medicare PPO | Admitting: Physical Therapy

## 2021-07-10 VITALS — BP 126/70 | HR 69 | Ht 63.0 in | Wt 207.4 lb

## 2021-07-10 DIAGNOSIS — I442 Atrioventricular block, complete: Secondary | ICD-10-CM

## 2021-07-10 DIAGNOSIS — G4733 Obstructive sleep apnea (adult) (pediatric): Secondary | ICD-10-CM

## 2021-07-10 DIAGNOSIS — G20 Parkinson's disease: Secondary | ICD-10-CM

## 2021-07-10 DIAGNOSIS — E785 Hyperlipidemia, unspecified: Secondary | ICD-10-CM

## 2021-07-10 DIAGNOSIS — I10 Essential (primary) hypertension: Secondary | ICD-10-CM

## 2021-07-10 DIAGNOSIS — R0602 Shortness of breath: Secondary | ICD-10-CM

## 2021-07-10 DIAGNOSIS — I77810 Thoracic aortic ectasia: Secondary | ICD-10-CM

## 2021-07-10 DIAGNOSIS — G20C Parkinsonism, unspecified: Secondary | ICD-10-CM

## 2021-07-10 DIAGNOSIS — Z79899 Other long term (current) drug therapy: Secondary | ICD-10-CM

## 2021-07-10 DIAGNOSIS — Z950 Presence of cardiac pacemaker: Secondary | ICD-10-CM | POA: Diagnosis not present

## 2021-07-10 DIAGNOSIS — N1832 Chronic kidney disease, stage 3b: Secondary | ICD-10-CM

## 2021-07-10 DIAGNOSIS — R600 Localized edema: Secondary | ICD-10-CM

## 2021-07-10 DIAGNOSIS — I2510 Atherosclerotic heart disease of native coronary artery without angina pectoris: Secondary | ICD-10-CM

## 2021-07-10 NOTE — Patient Instructions (Addendum)
Medication Instructions:  Continue Lasix 40 mg Mon, Wed, Fri. Take an additional 40 mg on Tues, Thurs, Sat, Sundays for weigh gain of 3 lbs over night or 5 lbs in 1 week.   *If you need a refill on your cardiac medications before your next appointment, please call your pharmacy*   Lab Work: Your physician recommends that you complete labs today BMET  If you have labs (blood work) drawn today and your tests are completely normal, you will receive your results only by: MyChart Message (if you have MyChart) OR A paper copy in the mail If you have any lab test that is abnormal or we need to change your treatment, we will call you to review the results.   Testing/Procedures: Your physician has requested that you have cardiac CT. Cardiac computed tomography (CT) is a painless test that uses an x-ray machine to take clear, detailed pictures of your heart. For further information please visit [email or web address]. Please follow instruction sheet as given.     Follow-Up: At [facility], you and your health needs are our priority.  As part of our continuing mission to provide you with exceptional heart care, we have created designated Provider Care Teams.  These Care Teams include your primary Cardiologist (physician) and Advanced Practice Providers (APPs -  Physician Assistants and Nurse Practitioners) who all work together to provide you with the care you need, when you need it.  We recommend signing up for the patient portal called "MyChart".  Sign up information is provided on this After Visit Summary.  MyChart is used to connect with patients for Virtual Visits (Telemedicine).  Patients are able to view lab/test results, encounter notes, upcoming appointments, etc.  Non-urgent messages can be sent to your provider as well.   To learn more about what you can do with MyChart, go to [email or web address].    Your next appointment:   3 month(s)  The format for your next appointment:   In  Person  Provider:   Pixie Casino, MD  or Diona Browner, NP        Other Instructions Heart Failure Education:  Weigh yourself EVERY morning after you go to the bathroom but before you eat or drink anything. Write this number down in a weight log/diary. If you gain 3 pounds overnight or 5 pounds in a week, call the office. Take your medicines as prescribed. If you have concerns about your medications, please call us before you stop taking them. Eat low salt foods--Limit salt (sodium) to 2000 mg per day. This will help prevent your body from holding onto fluid. Read food labels as many processed foods have a lot of sodium, especially canned goods and prepackaged meats. If you would like some assistance choosing low sodium foods, we would be happy to set you up with a nutritionist. Limit all fluids for the day to less than 2 liters (64 ounces). Fluid includes all drinks, coffee, juice, ice chips, soup, jello, and all other liquids. Stay as active as you can everyday. Staying active will give you more energy and make your muscles stronger. Start with 5 minutes at a time and work your way up to 30 minutes a day. Break up your activities--do some in the morning and some in the afternoon. Start with 3 days per week and work your way up to 5 days as you can.  If you have chest pain, feel short of breath, dizzy, or lightheaded, STOP. If you don't feel better after a short  rest, call 911. If you do feel better, call the office to let us know you have symptoms with exercise.  Salty 6 information given. Important Information About Sugar

## 2021-07-11 ENCOUNTER — Telehealth: Payer: Self-pay | Admitting: Physical Therapy

## 2021-07-11 ENCOUNTER — Encounter: Payer: Self-pay | Admitting: Physical Therapy

## 2021-07-11 NOTE — Therapy (Signed)
[facility] [address] [place], [place], [address] Phone: [phone]   Fax:  [phone]  Patient Details  Name: Gary [name] MRN: [id number] Date of Birth: 20-Sep-1932 Referring Provider:  No ref. provider found  Encounter Date: 07/11/2021  Spoke with pt's wife today and pt is electing to discharge from PT at this time due to recently progressed cardiac issues requiring further hospital-based testing.  Informed wife and pt about seeking further referral for PT if they would like to return at a later time and doctor deems it safe to do so.    Bary Richard, PT 07/11/2021, 5:22 PM  [facility] [address] [place], [place], [address] Phone: [phone]   Fax:  [phone]

## 2021-07-11 NOTE — Telephone Encounter (Signed)
Entered in error.  Earnest Mcgillis, PT, DPT  

## 2021-07-12 ENCOUNTER — Ambulatory Visit: Payer: Medicare PPO | Admitting: Physical Therapy

## 2021-07-17 ENCOUNTER — Other Ambulatory Visit: Payer: Self-pay | Admitting: Family Medicine

## 2021-07-17 ENCOUNTER — Ambulatory Visit: Payer: Medicare PPO | Admitting: Physical Therapy

## 2021-07-17 DIAGNOSIS — M79652 Pain in left thigh: Secondary | ICD-10-CM

## 2021-07-18 ENCOUNTER — Ambulatory Visit (HOSPITAL_COMMUNITY): Payer: Medicare PPO

## 2021-07-18 ENCOUNTER — Ambulatory Visit
Admission: RE | Admit: 2021-07-18 | Discharge: 2021-07-18 | Disposition: A | Discharge status: Home / Self Care | Payer: Medicare PPO | Source: Ambulatory Visit | Attending: Family Medicine | Admitting: Family Medicine

## 2021-07-18 DIAGNOSIS — M79652 Pain in left thigh: Secondary | ICD-10-CM

## 2021-07-19 ENCOUNTER — Ambulatory Visit: Payer: Medicare PPO | Admitting: Physical Therapy

## 2021-07-20 ENCOUNTER — Other Ambulatory Visit: Payer: Self-pay

## 2021-07-20 ENCOUNTER — Emergency Department (HOSPITAL_COMMUNITY)
Admission: EM | Admit: 2021-07-20 | Discharge: 2021-07-20 | Disposition: A | Discharge status: Home / Self Care | Payer: Medicare PPO | Attending: Emergency Medicine | Admitting: Emergency Medicine

## 2021-07-20 ENCOUNTER — Encounter (HOSPITAL_COMMUNITY): Payer: Self-pay

## 2021-07-20 ENCOUNTER — Emergency Department (HOSPITAL_COMMUNITY): Payer: Medicare PPO

## 2021-07-20 DIAGNOSIS — Z91040 Latex allergy status: Secondary | ICD-10-CM | POA: Diagnosis not present

## 2021-07-20 DIAGNOSIS — Z7982 Long term (current) use of aspirin: Secondary | ICD-10-CM | POA: Insufficient documentation

## 2021-07-20 DIAGNOSIS — R0789 Other chest pain: Secondary | ICD-10-CM | POA: Insufficient documentation

## 2021-07-20 LAB — CBC
HCT: 40.7 % (ref 39.0–52.0)
Hemoglobin: 13.3 g/dL (ref 13.0–17.0)
MCH: 30.2 pg (ref 26.0–34.0)
MCHC: 32.7 g/dL (ref 30.0–36.0)
MCV: 92.5 fL (ref 80.0–100.0)
Platelets: 166 10*3/uL (ref 150–400)
RBC: 4.4 MIL/uL (ref 4.22–5.81)
RDW: 12.9 % (ref 11.5–15.5)
WBC: 8.6 10*3/uL (ref 4.0–10.5)
nRBC: 0 % (ref 0.0–0.2)

## 2021-07-20 LAB — TROPONIN I (HIGH SENSITIVITY)
Troponin I (High Sensitivity): 19 ng/L — ABNORMAL HIGH (ref <18)
Troponin I (High Sensitivity): 18 ng/L — ABNORMAL HIGH (ref <18)

## 2021-07-20 LAB — BASIC METABOLIC PANEL
Anion gap: 7 (ref 5–15)
BUN: 32 mg/dL — ABNORMAL HIGH (ref 8–23)
CO2: 30 mmol/L (ref 22–32)
Calcium: 9.4 mg/dL (ref 8.9–10.3)
Chloride: 102 mmol/L (ref 98–111)
Creatinine, Ser: 1.66 mg/dL — ABNORMAL HIGH (ref 0.61–1.24)
GFR, Estimated: 40 mL/min — ABNORMAL LOW (ref >60)
Glucose, Bld: 113 mg/dL — ABNORMAL HIGH (ref 70–99)
Potassium: 4.6 mmol/L (ref 3.5–5.1)
Sodium: 139 mmol/L (ref 135–145)

## 2021-07-20 NOTE — ED Provider Notes (Signed)
[facility] EMERGENCY DEPARTMENT Provider Note   CSN: [id number] Arrival date & time: 07/20/21  1156     History  Chief Complaint  Patient presents with   Chest Pain    Gary [name] is a 88 y.o. male.  88 year old male who presents with intermittent left-sided chest discomfort.  States that discomfort lasting several minutes.  It has not been associated with dizziness or diaphoresis.  No cough congestion.  States that he is been more anxious recently and feels that this might be a cause of it.  Also has a history of GERD but denies any severe burping.  No treatment use was prior to arrival       Home Medications Prior to Admission medications   Medication Sig Start Date End Date Taking? Authorizing Provider  acetaminophen (TYLENOL) 500 MG tablet Take 1,000 mg by mouth every 8 (eight) hours as needed for fever.    [provider]  amLODipine (NORVASC) 5 MG tablet TAKE 1 TABLET BY MOUTH EVERY DAY 11/13/20   Hilty, Nadean Corwin, MD  aspirin EC 81 MG tablet Take 81 mg by mouth daily.    [provider]  blood glucose meter kit and supplies KIT Dispense based on patient and insurance preference. Use up to four times daily as directed. (FOR ICD-9 250.00, 250.01). 03/29/19   Geradine Girt, DO  busPIRone (BUSPAR) 10 MG tablet     [provider]  carvedilol (COREG) 3.125 MG tablet TAKE 1 TABLET (3.125 MG TOTAL) BY MOUTH 2 (TWO) TIMES DAILY WITH A MEAL. 10/09/20   Hilty, Nadean Corwin, MD  citalopram (CELEXA) 40 MG tablet Take 1 tablet (40 mg total) by mouth daily. 08/19/19   Minette Brine, FNP  diclofenac Sodium (VOLTAREN) 1 % GEL APPLY 2 G TO AFFECTED AREA 4 TIMES A DAY 11/13/20   Hilty, Nadean Corwin, MD  doxazosin (CARDURA) 4 MG tablet Take 4 mg by mouth daily.    [provider]  ergocalciferol (VITAMIN D2) 1.25 MG (50000 UT) capsule ergocalciferol (vitamin D2) 1,250 mcg (50,000 unit) capsule  TAKE 1 CAPSULE BY MOUTH ONE TIME PER WEEK     [provider]  fluticasone (FLONASE) 50 MCG/ACT nasal spray Place 2 sprays into both nostrils daily.    [provider]  furosemide (LASIX) 40 MG tablet Take 1 tablet (40 mg total) by mouth daily. Monday,wednesday and friday 05/15/20   Pixie Casino, MD  mupirocin ointment (BACTROBAN) 2 % Apply 1 application topically 2 (two) times daily. 07/31/20   Lavonna Monarch, MD  nitroGLYCERIN (NITROSTAT) 0.4 MG SL tablet PLACE 1 TABLET UNDER THE TONGUE EVERY 5 MINUTES AS NEEDED FOR CHEST PAIN FOR 3 DOSES Patient taking differently: Place 0.4 mg under the tongue every 5 (five) minutes as needed for chest pain. 10/25/16   Hilty, Nadean Corwin, MD  nystatin (MYCOSTATIN/NYSTOP) powder Apply topically. 06/10/21   [provider]  pregabalin (LYRICA) 75 MG capsule Take 1 capsule (75 mg total) by mouth 3 (three) times daily. 09/08/19 07/10/21  Minette Brine, FNP  simvastatin (ZOCOR) 40 MG tablet TAKE 1 TABLET BY MOUTH AT BEDTIME Patient taking differently: Take 40 mg by mouth at bedtime. 03/18/16   Pixie Casino, MD  Tamsulosin HCl (FLOMAX) 0.4 MG CAPS Take 0.4 mg by mouth daily after supper. 02/04/11   Charlynne Cousins, MD  traMADol Veatrice Bourbon) 50 MG tablet  10/21/19   [provider]  vitamin B-12 1000 MCG tablet Take 1 tablet (1,000 mcg  total) by mouth daily. 03/29/19   Geradine Girt, DO      Allergies    Naproxen, Clopidogrel bisulfate, Sulfonamide derivatives, Other, Latex, and Tape    Review of Systems   Review of Systems  All other systems reviewed and are negative.   Physical Exam Updated Vital Signs BP (!) 161/80   Pulse (!) 59   Temp 97.8 F (36.6 C) (Oral)   Resp 17   SpO2 99%  Physical Exam Vitals and nursing note reviewed.  Constitutional:      General: He is not in acute distress.    Appearance: Normal appearance. He is well-developed. He is not toxic-appearing.  HENT:     Head: Normocephalic and atraumatic.  Eyes:     General: Lids are normal.      Conjunctiva/sclera: Conjunctivae normal.     Pupils: Pupils are equal, round, and reactive to light.  Neck:     Thyroid: No thyroid mass.     Trachea: No tracheal deviation.  Cardiovascular:     Rate and Rhythm: Normal rate and regular rhythm.     Heart sounds: Normal heart sounds. No murmur heard.    No gallop.  Pulmonary:     Effort: Pulmonary effort is normal. No respiratory distress.     Breath sounds: Normal breath sounds. No stridor. No decreased breath sounds, wheezing, rhonchi or rales.  Abdominal:     General: There is no distension.     Palpations: Abdomen is soft.     Tenderness: There is no abdominal tenderness. There is no rebound.  Musculoskeletal:        General: No tenderness. Normal range of motion.     Cervical back: Normal range of motion and neck supple.  Skin:    General: Skin is warm and dry.     Findings: No abrasion or rash.  Neurological:     Mental Status: He is alert and oriented to person, place, and time. Mental status is at baseline.     GCS: GCS eye subscore is 4. GCS verbal subscore is 5. GCS motor subscore is 6.     Cranial Nerves: No cranial nerve deficit.     Sensory: No sensory deficit.     Motor: Motor function is intact.  Psychiatric:        Attention and Perception: Attention normal.        Speech: Speech normal.        Behavior: Behavior normal.     ED Results / Procedures / Treatments   Labs (all labs ordered are listed, but only abnormal results are displayed) Labs Reviewed  BASIC METABOLIC PANEL - Abnormal; Notable for the following components:      Result Value   Glucose, Bld 113 (*)    BUN 32 (*)    Creatinine, Ser 1.66 (*)    GFR, Estimated 40 (*)    All other components within normal limits  TROPONIN I (HIGH SENSITIVITY) - Abnormal; Notable for the following components:   Troponin I (High Sensitivity) 19 (*)    All other components within normal limits  CBC  TROPONIN I (HIGH SENSITIVITY)    EKG EKG  Interpretation  Date/Time:  Friday July 20 2021 12:02:28 EDT Ventricular Rate:  60 PR Interval:  274 QRS Duration: 184 QT Interval:  490 QTC Calculation: 490 R Axis:   -72 Text Interpretation: AV dual-paced rhythm with prolonged AV conduction Abnormal ECG When compared with ECG of 15-Aug-2020 12:33, PREVIOUS ECG IS PRESENT No  significant change since last tracing Confirmed by Lacretia Leigh ([id number]) on 07/20/2021 5:02:15 PM  Radiology DG Chest 2 View  Result Date: 07/20/2021 CLINICAL DATA:  Left-sided chest pain EXAM: CHEST - 2 VIEW COMPARISON:  08/15/2020 FINDINGS: Unchanged position of cardiac device and leads. Stable cardiomegaly. Low lung volumes with unchanged elevation of the right hemidiaphragm. Bibasilar atelectasis. No pleural effusion or pneumothorax. No acute osseous abnormality. IMPRESSION: Stable cardiomegaly and low lung volumes. Electronically Signed   By: Merilyn Baba M.D.   On: 07/20/2021 12:49    Procedures Procedures    Medications Ordered in ED Medications - No data to display  ED Course/ Medical Decision Making/ A&P                           Medical Decision Making  Patient is EKG per my interpretation shows paced rhythm.  Chest x-ray per my interpretation shows no evidence of pneumothorax.  Patient's delta troponin here remained stable.  Low suspicion for ACS.  Suspect anxiety.  Discussed with patient's family and they are comfortable with this.  Will discharge at this time and follow-up with his doctor as needed        Final Clinical Impression(s) / ED Diagnoses Final diagnoses:  None    Rx / DC Orders ED Discharge Orders     None         Lacretia Leigh, MD 07/20/21 1905

## 2021-07-20 NOTE — ED Provider Triage Note (Signed)
Emergency Medicine Provider Triage Evaluation Note  Gary [name] , a 88 y.o. male  was evaluated in triage.  Pt complains of intermittent left-sided chest pain for the past week.  Reports history of MI and pacemaker placement.  No stents or CABG.  Says he also has a history of indigestion and wonders if it could be this.  Also endorses some stomach upset.  Reports that he has a "short colon" after having a lot of for his colon taken out.  He says he got it removed because he continued to have GI bleeding.  Review of Systems  Positive: Intermittent chest pain, exertional shortness of breath Negative: Palpitations, dizziness, syncope, nausea, vomiting, diarrhea  Physical Exam  BP (!) 134/92 (BP Location: Right Arm)   Pulse 60   Temp 98.8 F (37.1 C) (Oral)   Resp 18   SpO2 96%  Gen:   Awake, no distress   Resp:  Normal effort  MSK:   Moves extremities without difficulty  Other:  Distant heart sounds, RRR, lung sounds clear  Medical Decision Making  Medically screening exam initiated at 12:09 PM.  Appropriate orders placed.  [name] was informed that the remainder of the evaluation will be completed by another provider, this initial triage assessment does not replace that evaluation, and the importance of remaining in the ED until their evaluation is complete.  Normal chest pain work-up.  Appears that patient may have abnormal colonoscopy with premalignant polyps.  Unable to decipher what caused his partial colectomy.   Rhae Hammock, PA-C 07/20/21 1211

## 2021-07-20 NOTE — ED Triage Notes (Signed)
Pt BIB [facility] from home c/o left sided CP that started last night. Pt was given 324 of ASA and now the pain as resolved.

## 2021-07-20 NOTE — ED Notes (Signed)
Patient verbalizes understanding of discharge instructions. Opportunity for questioning and answers were provided. Armband removed by staff, pt discharged from ED. Pt taken to ED entrance via wheel chair and assisted into the vehicle.

## 2021-07-24 ENCOUNTER — Ambulatory Visit: Payer: Medicare PPO | Admitting: Physical Therapy

## 2021-07-24 ENCOUNTER — Ambulatory Visit (HOSPITAL_COMMUNITY): Payer: Medicare PPO

## 2021-07-26 ENCOUNTER — Ambulatory Visit: Payer: Medicare PPO | Admitting: Physical Therapy

## 2021-07-31 ENCOUNTER — Ambulatory Visit: Payer: Medicare PPO | Admitting: Physical Therapy

## 2021-08-01 ENCOUNTER — Ambulatory Visit (HOSPITAL_COMMUNITY)
Admission: RE | Admit: 2021-08-01 | Discharge: 2021-08-01 | Disposition: A | Discharge status: Home / Self Care | Payer: Medicare PPO | Source: Ambulatory Visit | Attending: Nurse Practitioner | Admitting: Nurse Practitioner

## 2021-08-01 DIAGNOSIS — I10 Essential (primary) hypertension: Secondary | ICD-10-CM | POA: Insufficient documentation

## 2021-08-01 DIAGNOSIS — I442 Atrioventricular block, complete: Secondary | ICD-10-CM | POA: Insufficient documentation

## 2021-08-01 DIAGNOSIS — I77810 Thoracic aortic ectasia: Secondary | ICD-10-CM | POA: Diagnosis present

## 2021-08-01 DIAGNOSIS — E785 Hyperlipidemia, unspecified: Secondary | ICD-10-CM | POA: Insufficient documentation

## 2021-08-01 DIAGNOSIS — R600 Localized edema: Secondary | ICD-10-CM | POA: Diagnosis not present

## 2021-08-01 DIAGNOSIS — Z950 Presence of cardiac pacemaker: Secondary | ICD-10-CM | POA: Diagnosis present

## 2021-08-01 DIAGNOSIS — N1832 Chronic kidney disease, stage 3b: Secondary | ICD-10-CM | POA: Diagnosis present

## 2021-08-01 DIAGNOSIS — G20 Parkinson's disease: Secondary | ICD-10-CM | POA: Diagnosis present

## 2021-08-01 DIAGNOSIS — I2510 Atherosclerotic heart disease of native coronary artery without angina pectoris: Secondary | ICD-10-CM | POA: Diagnosis present

## 2021-08-01 MED ORDER — IOHEXOL 350 MG/ML SOLN
75.0000 mL | Freq: Once | INTRAVENOUS | Status: AC | PRN
  Administered 2021-08-01: 75 mL via INTRAVENOUS

## 2021-08-01 MED ORDER — SODIUM CHLORIDE (PF) 0.9 % IJ SOLN
INTRAMUSCULAR | Status: AC
  Filled 2021-08-01: qty 50

## 2021-08-02 ENCOUNTER — Encounter: Payer: Self-pay | Admitting: Internal Medicine

## 2021-08-02 ENCOUNTER — Ambulatory Visit: Payer: Medicare PPO | Admitting: Physical Therapy

## 2021-08-02 NOTE — Telephone Encounter (Signed)
Noted. Spoke with pt and pt was scheduled an office visit for next week 08/07/21 @ 1:55 PM.

## 2021-08-03 ENCOUNTER — Telehealth: Payer: Self-pay

## 2021-08-03 NOTE — Telephone Encounter (Signed)
Spoke with pts  spouse. She was notified of results. Will discuss further at pts office visit next week.

## 2021-08-07 ENCOUNTER — Ambulatory Visit: Payer: Medicare PPO | Admitting: Physical Therapy

## 2021-08-07 ENCOUNTER — Ambulatory Visit: Payer: Medicare PPO | Admitting: Nurse Practitioner

## 2021-08-07 ENCOUNTER — Encounter: Payer: Self-pay | Admitting: Nurse Practitioner

## 2021-08-07 VITALS — BP 138/62 | HR 77 | Resp 20 | Ht 64.0 in | Wt 206.0 lb

## 2021-08-07 DIAGNOSIS — R600 Localized edema: Secondary | ICD-10-CM

## 2021-08-07 DIAGNOSIS — I10 Essential (primary) hypertension: Secondary | ICD-10-CM | POA: Diagnosis not present

## 2021-08-07 DIAGNOSIS — N1832 Chronic kidney disease, stage 3b: Secondary | ICD-10-CM

## 2021-08-07 DIAGNOSIS — I442 Atrioventricular block, complete: Secondary | ICD-10-CM

## 2021-08-07 DIAGNOSIS — I77810 Thoracic aortic ectasia: Secondary | ICD-10-CM

## 2021-08-07 DIAGNOSIS — E785 Hyperlipidemia, unspecified: Secondary | ICD-10-CM

## 2021-08-07 DIAGNOSIS — G20 Parkinson's disease: Secondary | ICD-10-CM

## 2021-08-07 DIAGNOSIS — G4733 Obstructive sleep apnea (adult) (pediatric): Secondary | ICD-10-CM

## 2021-08-07 DIAGNOSIS — I2510 Atherosclerotic heart disease of native coronary artery without angina pectoris: Secondary | ICD-10-CM

## 2021-08-08 ENCOUNTER — Ambulatory Visit: Payer: Medicare PPO | Admitting: Dermatology

## 2021-08-08 DIAGNOSIS — C4442 Squamous cell carcinoma of skin of scalp and neck: Secondary | ICD-10-CM | POA: Diagnosis not present

## 2021-08-08 DIAGNOSIS — D044 Carcinoma in situ of skin of scalp and neck: Secondary | ICD-10-CM

## 2021-08-08 DIAGNOSIS — D0461 Carcinoma in situ of skin of right upper limb, including shoulder: Secondary | ICD-10-CM

## 2021-08-08 DIAGNOSIS — D485 Neoplasm of uncertain behavior of skin: Secondary | ICD-10-CM

## 2021-08-08 DIAGNOSIS — L570 Actinic keratosis: Secondary | ICD-10-CM | POA: Diagnosis not present

## 2021-08-08 NOTE — Patient Instructions (Signed)

## 2021-08-09 ENCOUNTER — Ambulatory Visit: Payer: Medicare PPO | Admitting: Physical Therapy

## 2021-08-10 ENCOUNTER — Encounter (HOSPITAL_COMMUNITY): Payer: Self-pay | Admitting: *Deleted

## 2021-08-10 ENCOUNTER — Emergency Department (HOSPITAL_COMMUNITY): Payer: Medicare PPO

## 2021-08-10 ENCOUNTER — Inpatient Hospital Stay (HOSPITAL_COMMUNITY)
Admission: EM | Admit: 2021-08-10 | Discharge: 2021-08-14 | DRG: 394 | Disposition: A | Discharge status: Home Health | Payer: Medicare PPO | Attending: Internal Medicine | Admitting: Internal Medicine

## 2021-08-10 ENCOUNTER — Other Ambulatory Visit: Payer: Self-pay

## 2021-08-10 DIAGNOSIS — D631 Anemia in chronic kidney disease: Secondary | ICD-10-CM | POA: Diagnosis present

## 2021-08-10 DIAGNOSIS — M549 Dorsalgia, unspecified: Secondary | ICD-10-CM | POA: Diagnosis present

## 2021-08-10 DIAGNOSIS — Z888 Allergy status to other drugs, medicaments and biological substances status: Secondary | ICD-10-CM

## 2021-08-10 DIAGNOSIS — I272 Pulmonary hypertension, unspecified: Secondary | ICD-10-CM | POA: Diagnosis present

## 2021-08-10 DIAGNOSIS — I10 Essential (primary) hypertension: Secondary | ICD-10-CM | POA: Diagnosis present

## 2021-08-10 DIAGNOSIS — E1122 Type 2 diabetes mellitus with diabetic chronic kidney disease: Secondary | ICD-10-CM | POA: Diagnosis present

## 2021-08-10 DIAGNOSIS — I2510 Atherosclerotic heart disease of native coronary artery without angina pectoris: Secondary | ICD-10-CM | POA: Diagnosis present

## 2021-08-10 DIAGNOSIS — Z9861 Coronary angioplasty status: Secondary | ICD-10-CM

## 2021-08-10 DIAGNOSIS — D696 Thrombocytopenia, unspecified: Secondary | ICD-10-CM | POA: Diagnosis present

## 2021-08-10 DIAGNOSIS — Z882 Allergy status to sulfonamides status: Secondary | ICD-10-CM

## 2021-08-10 DIAGNOSIS — E538 Deficiency of other specified B group vitamins: Secondary | ICD-10-CM | POA: Diagnosis present

## 2021-08-10 DIAGNOSIS — I459 Conduction disorder, unspecified: Secondary | ICD-10-CM | POA: Diagnosis present

## 2021-08-10 DIAGNOSIS — Z85828 Personal history of other malignant neoplasm of skin: Secondary | ICD-10-CM

## 2021-08-10 DIAGNOSIS — Z8249 Family history of ischemic heart disease and other diseases of the circulatory system: Secondary | ICD-10-CM

## 2021-08-10 DIAGNOSIS — F39 Unspecified mood [affective] disorder: Secondary | ICD-10-CM | POA: Diagnosis present

## 2021-08-10 DIAGNOSIS — Z981 Arthrodesis status: Secondary | ICD-10-CM

## 2021-08-10 DIAGNOSIS — E785 Hyperlipidemia, unspecified: Secondary | ICD-10-CM | POA: Diagnosis present

## 2021-08-10 DIAGNOSIS — G4733 Obstructive sleep apnea (adult) (pediatric): Secondary | ICD-10-CM | POA: Diagnosis present

## 2021-08-10 DIAGNOSIS — M1612 Unilateral primary osteoarthritis, left hip: Secondary | ICD-10-CM | POA: Diagnosis present

## 2021-08-10 DIAGNOSIS — Z7982 Long term (current) use of aspirin: Secondary | ICD-10-CM

## 2021-08-10 DIAGNOSIS — Z91040 Latex allergy status: Secondary | ICD-10-CM

## 2021-08-10 DIAGNOSIS — Z6837 Body mass index (BMI) 37.0-37.9, adult: Secondary | ICD-10-CM

## 2021-08-10 DIAGNOSIS — N1832 Chronic kidney disease, stage 3b: Secondary | ICD-10-CM | POA: Diagnosis present

## 2021-08-10 DIAGNOSIS — K5900 Constipation, unspecified: Secondary | ICD-10-CM | POA: Diagnosis present

## 2021-08-10 DIAGNOSIS — I130 Hypertensive heart and chronic kidney disease with heart failure and stage 1 through stage 4 chronic kidney disease, or unspecified chronic kidney disease: Secondary | ICD-10-CM | POA: Diagnosis present

## 2021-08-10 DIAGNOSIS — N400 Enlarged prostate without lower urinary tract symptoms: Secondary | ICD-10-CM | POA: Diagnosis present

## 2021-08-10 DIAGNOSIS — K631 Perforation of intestine (nontraumatic): Secondary | ICD-10-CM | POA: Diagnosis not present

## 2021-08-10 DIAGNOSIS — N189 Chronic kidney disease, unspecified: Secondary | ICD-10-CM | POA: Diagnosis present

## 2021-08-10 DIAGNOSIS — G8929 Other chronic pain: Secondary | ICD-10-CM | POA: Diagnosis present

## 2021-08-10 DIAGNOSIS — M479 Spondylosis, unspecified: Secondary | ICD-10-CM | POA: Diagnosis present

## 2021-08-10 DIAGNOSIS — I480 Paroxysmal atrial fibrillation: Secondary | ICD-10-CM | POA: Diagnosis present

## 2021-08-10 DIAGNOSIS — I252 Old myocardial infarction: Secondary | ICD-10-CM

## 2021-08-10 DIAGNOSIS — K668 Other specified disorders of peritoneum: Principal | ICD-10-CM | POA: Diagnosis present

## 2021-08-10 DIAGNOSIS — Z96641 Presence of right artificial hip joint: Secondary | ICD-10-CM | POA: Diagnosis present

## 2021-08-10 DIAGNOSIS — K219 Gastro-esophageal reflux disease without esophagitis: Secondary | ICD-10-CM | POA: Diagnosis present

## 2021-08-10 DIAGNOSIS — Z886 Allergy status to analgesic agent status: Secondary | ICD-10-CM

## 2021-08-10 DIAGNOSIS — R1013 Epigastric pain: Secondary | ICD-10-CM | POA: Diagnosis not present

## 2021-08-10 DIAGNOSIS — Z823 Family history of stroke: Secondary | ICD-10-CM

## 2021-08-10 DIAGNOSIS — Z800 Family history of malignant neoplasm of digestive organs: Secondary | ICD-10-CM

## 2021-08-10 DIAGNOSIS — I5032 Chronic diastolic (congestive) heart failure: Secondary | ICD-10-CM | POA: Diagnosis present

## 2021-08-10 DIAGNOSIS — Z79899 Other long term (current) drug therapy: Secondary | ICD-10-CM

## 2021-08-10 DIAGNOSIS — E669 Obesity, unspecified: Secondary | ICD-10-CM | POA: Diagnosis present

## 2021-08-10 NOTE — ED Triage Notes (Signed)
The pt arrived by [facility] from home  the pt has been c/o  abd pain for one hour  no pain at present a and  o x 4  cbg  210  pt just ate prior to arrival

## 2021-08-11 ENCOUNTER — Encounter (HOSPITAL_COMMUNITY): Payer: Self-pay | Admitting: Emergency Medicine

## 2021-08-11 ENCOUNTER — Inpatient Hospital Stay (HOSPITAL_COMMUNITY): Payer: Medicare PPO

## 2021-08-11 DIAGNOSIS — E785 Hyperlipidemia, unspecified: Secondary | ICD-10-CM | POA: Diagnosis present

## 2021-08-11 DIAGNOSIS — G8929 Other chronic pain: Secondary | ICD-10-CM | POA: Diagnosis present

## 2021-08-11 DIAGNOSIS — N400 Enlarged prostate without lower urinary tract symptoms: Secondary | ICD-10-CM | POA: Diagnosis present

## 2021-08-11 DIAGNOSIS — I480 Paroxysmal atrial fibrillation: Secondary | ICD-10-CM | POA: Diagnosis present

## 2021-08-11 DIAGNOSIS — Z6837 Body mass index (BMI) 37.0-37.9, adult: Secondary | ICD-10-CM | POA: Diagnosis not present

## 2021-08-11 DIAGNOSIS — K5900 Constipation, unspecified: Secondary | ICD-10-CM | POA: Diagnosis not present

## 2021-08-11 DIAGNOSIS — G4733 Obstructive sleep apnea (adult) (pediatric): Secondary | ICD-10-CM | POA: Diagnosis present

## 2021-08-11 DIAGNOSIS — M549 Dorsalgia, unspecified: Secondary | ICD-10-CM | POA: Diagnosis present

## 2021-08-11 DIAGNOSIS — K631 Perforation of intestine (nontraumatic): Secondary | ICD-10-CM

## 2021-08-11 DIAGNOSIS — I2510 Atherosclerotic heart disease of native coronary artery without angina pectoris: Secondary | ICD-10-CM | POA: Diagnosis present

## 2021-08-11 DIAGNOSIS — D631 Anemia in chronic kidney disease: Secondary | ICD-10-CM | POA: Diagnosis present

## 2021-08-11 DIAGNOSIS — I272 Pulmonary hypertension, unspecified: Secondary | ICD-10-CM | POA: Diagnosis present

## 2021-08-11 DIAGNOSIS — I10 Essential (primary) hypertension: Secondary | ICD-10-CM | POA: Diagnosis not present

## 2021-08-11 DIAGNOSIS — D696 Thrombocytopenia, unspecified: Secondary | ICD-10-CM | POA: Diagnosis present

## 2021-08-11 DIAGNOSIS — F39 Unspecified mood [affective] disorder: Secondary | ICD-10-CM | POA: Diagnosis present

## 2021-08-11 DIAGNOSIS — M1612 Unilateral primary osteoarthritis, left hip: Secondary | ICD-10-CM | POA: Diagnosis present

## 2021-08-11 DIAGNOSIS — K219 Gastro-esophageal reflux disease without esophagitis: Secondary | ICD-10-CM | POA: Diagnosis present

## 2021-08-11 DIAGNOSIS — I459 Conduction disorder, unspecified: Secondary | ICD-10-CM | POA: Diagnosis present

## 2021-08-11 DIAGNOSIS — I130 Hypertensive heart and chronic kidney disease with heart failure and stage 1 through stage 4 chronic kidney disease, or unspecified chronic kidney disease: Secondary | ICD-10-CM | POA: Diagnosis present

## 2021-08-11 DIAGNOSIS — M479 Spondylosis, unspecified: Secondary | ICD-10-CM | POA: Diagnosis present

## 2021-08-11 DIAGNOSIS — E669 Obesity, unspecified: Secondary | ICD-10-CM | POA: Diagnosis present

## 2021-08-11 DIAGNOSIS — E538 Deficiency of other specified B group vitamins: Secondary | ICD-10-CM | POA: Diagnosis present

## 2021-08-11 DIAGNOSIS — N1832 Chronic kidney disease, stage 3b: Secondary | ICD-10-CM

## 2021-08-11 DIAGNOSIS — R1013 Epigastric pain: Secondary | ICD-10-CM | POA: Diagnosis present

## 2021-08-11 DIAGNOSIS — K668 Other specified disorders of peritoneum: Secondary | ICD-10-CM

## 2021-08-11 DIAGNOSIS — I5032 Chronic diastolic (congestive) heart failure: Secondary | ICD-10-CM | POA: Diagnosis present

## 2021-08-11 DIAGNOSIS — E1122 Type 2 diabetes mellitus with diabetic chronic kidney disease: Secondary | ICD-10-CM | POA: Diagnosis present

## 2021-08-11 LAB — COMPREHENSIVE METABOLIC PANEL
ALT: 14 U/L (ref 0–44)
AST: 19 U/L (ref 15–41)
Albumin: 3.8 g/dL (ref 3.5–5.0)
Alkaline Phosphatase: 30 U/L — ABNORMAL LOW (ref 38–126)
Anion gap: 12 (ref 5–15)
BUN: 24 mg/dL — ABNORMAL HIGH (ref 8–23)
CO2: 27 mmol/L (ref 22–32)
Calcium: 9.3 mg/dL (ref 8.9–10.3)
Chloride: 101 mmol/L (ref 98–111)
Creatinine, Ser: 1.58 mg/dL — ABNORMAL HIGH (ref 0.61–1.24)
GFR, Estimated: 42 mL/min — ABNORMAL LOW (ref >60)
Glucose, Bld: 222 mg/dL — ABNORMAL HIGH (ref 70–99)
Potassium: 4.1 mmol/L (ref 3.5–5.1)
Sodium: 140 mmol/L (ref 135–145)
Total Bilirubin: 0.8 mg/dL (ref 0.3–1.2)
Total Protein: 6.1 g/dL — ABNORMAL LOW (ref 6.5–8.1)

## 2021-08-11 LAB — CBC WITH DIFFERENTIAL/PLATELET
Abs Immature Granulocytes: 0.02 10*3/uL (ref 0.00–0.07)
Basophils Absolute: 0 10*3/uL (ref 0.0–0.1)
Basophils Relative: 1 %
Eosinophils Absolute: 0.2 10*3/uL (ref 0.0–0.5)
Eosinophils Relative: 4 %
HCT: 38.1 % — ABNORMAL LOW (ref 39.0–52.0)
Hemoglobin: 12.9 g/dL — ABNORMAL LOW (ref 13.0–17.0)
Immature Granulocytes: 0 %
Lymphocytes Relative: 14 %
Lymphs Abs: 0.8 10*3/uL (ref 0.7–4.0)
MCH: 30.9 pg (ref 26.0–34.0)
MCHC: 33.9 g/dL (ref 30.0–36.0)
MCV: 91.1 fL (ref 80.0–100.0)
Monocytes Absolute: 0.4 10*3/uL (ref 0.1–1.0)
Monocytes Relative: 8 %
Neutro Abs: 4.1 10*3/uL (ref 1.7–7.7)
Neutrophils Relative %: 73 %
Platelets: 138 10*3/uL — ABNORMAL LOW (ref 150–400)
RBC: 4.18 MIL/uL — ABNORMAL LOW (ref 4.22–5.81)
RDW: 12.8 % (ref 11.5–15.5)
WBC: 5.6 10*3/uL (ref 4.0–10.5)
nRBC: 0 % (ref 0.0–0.2)

## 2021-08-11 LAB — URINALYSIS, ROUTINE W REFLEX MICROSCOPIC
Bacteria, UA: NONE SEEN
Bilirubin Urine: NEGATIVE
Glucose, UA: 150 mg/dL — AB
Hgb urine dipstick: NEGATIVE
Ketones, ur: NEGATIVE mg/dL
Nitrite: NEGATIVE
Protein, ur: NEGATIVE mg/dL
Specific Gravity, Urine: 1.006 (ref 1.005–1.030)
pH: 6 (ref 5.0–8.0)

## 2021-08-11 LAB — LIPASE, BLOOD: Lipase: 39 U/L (ref 11–51)

## 2021-08-11 MED ORDER — LABETALOL HCL 5 MG/ML IV SOLN
5.0000 mg | INTRAVENOUS | Status: DC | PRN
  Administered 2021-08-12: 5 mg via INTRAVENOUS
  Filled 2021-08-11: qty 4

## 2021-08-11 MED ORDER — SODIUM CHLORIDE 0.9% FLUSH
3.0000 mL | Freq: Two times a day (BID) | INTRAVENOUS | Status: DC
  Administered 2021-08-12 – 2021-08-14 (×3): 3 mL via INTRAVENOUS

## 2021-08-11 MED ORDER — FENTANYL CITRATE PF 50 MCG/ML IJ SOSY
25.0000 ug | PREFILLED_SYRINGE | Freq: Once | INTRAMUSCULAR | Status: AC
  Administered 2021-08-11: 25 ug via INTRAVENOUS
  Filled 2021-08-11: qty 1

## 2021-08-11 MED ORDER — FENTANYL CITRATE PF 50 MCG/ML IJ SOSY: 12.5000 ug | PREFILLED_SYRINGE | INTRAMUSCULAR | Status: DC | PRN

## 2021-08-11 MED ORDER — PIPERACILLIN-TAZOBACTAM 3.375 G IVPB
3.3750 g | Freq: Three times a day (TID) | INTRAVENOUS | Status: DC
  Administered 2021-08-11 – 2021-08-14 (×10): 3.375 g via INTRAVENOUS
  Filled 2021-08-11 (×10): qty 50

## 2021-08-11 MED ORDER — SODIUM CHLORIDE 0.9 % IV SOLN: INTRAVENOUS | Status: AC

## 2021-08-11 MED ORDER — PIPERACILLIN-TAZOBACTAM 3.375 G IVPB 30 MIN
3.3750 g | Freq: Once | INTRAVENOUS | Status: AC
  Administered 2021-08-11: 3.375 g via INTRAVENOUS
  Filled 2021-08-11: qty 50

## 2021-08-11 NOTE — Progress Notes (Signed)
[facility] OF CARE NOTE Patient: Gary [name] LTR:[id number]   PCP: Clovia Cuff, MD DOB: 1933/01/10   DOA: 08/10/2021   DOS: 08/11/2021    Patient was admitted by my colleague earlier on 08/11/2021. I have reviewed the H&P as well as assessment and plan and agree with the same. Important changes in the plan are listed below.  Plan of care: Principal Problem:   Perforated bowel ([place]) Active Problems:   Essential hypertension   Dyslipidemia   BPH (benign prostatic hyperplasia)   Stage 3b chronic kidney disease (HCC)   Anemia in chronic kidney disease   CAD in native artery   Pulmonary hypertension (HCC)   Vitamin B12 deficiency   Chronic back pain greater than 3 months duration   Pneumoperitoneum   Thrombocytopenia (HCC)   Mood disorder (HCC)    Continue IV antibiotics. Agree patient is not a good surgical candidate. X-ray report mentions free air has reduced.  I am unable to visualize myself on the x-ray though. No abdominal tenderness at the time of my evaluation. Bilateral lower extremity edema which is chronic and the patient is taking Lasix for that which is currently on hold and actually patient currently receiving IV fluid.  We will monitor this.  Author: Berle Mull, MD [facility] 08/11/2021 10:16 AM   If 7PM-7AM, please contact night-coverage at [email or web address]

## 2021-08-11 NOTE — Plan of Care (Signed)
  Problem: Nutrition: Goal: Adequate nutrition will be maintained Outcome: Progressing   Problem: Pain Managment: Goal: General experience of comfort will improve Outcome: Progressing   Problem: Safety: Goal: Ability to remain free from injury will improve Outcome: Progressing   

## 2021-08-11 NOTE — ED Notes (Signed)
Pt and wife have decided to stay after speaking with the Hospitalist.

## 2021-08-11 NOTE — ED Provider Notes (Signed)
[facility] EMERGENCY DEPARTMENT Provider Note   CSN: [id number] Arrival date & time: 08/10/21  2303     History  Chief Complaint  Patient presents with   Abdominal Pain    Gary [name] is a 88 y.o. male.  The history is provided by the patient.  Abdominal Pain Pain location:  Generalized Pain radiates to:  Does not radiate Pain severity:  Severe Onset quality:  Sudden Duration:  1 hour Timing:  Constant Progression:  Unchanged Chronicity:  New Relieved by:  Nothing Worsened by:  Nothing Ineffective treatments: home narcotics and lyrica. Associated symptoms: no chest pain, no dysuria, no fever and no vomiting   Risk factors: aspirin and being elderly        Home Medications Prior to Admission medications   Medication Sig Start Date End Date Taking? Authorizing Provider  acetaminophen (TYLENOL) 500 MG tablet Take 1,000 mg by mouth every 8 (eight) hours as needed for fever.    [provider]  amLODipine (NORVASC) 5 MG tablet TAKE 1 TABLET BY MOUTH EVERY DAY 11/13/20   Hilty, Nadean Corwin, MD  aspirin EC 81 MG tablet Take 81 mg by mouth daily.    [provider]  blood glucose meter kit and supplies KIT Dispense based on patient and insurance preference. Use up to four times daily as directed. (FOR ICD-9 250.00, 250.01). 03/29/19   Eulogio Bear U, DO  carvedilol (COREG) 3.125 MG tablet TAKE 1 TABLET (3.125 MG TOTAL) BY MOUTH 2 (TWO) TIMES DAILY WITH A MEAL. 10/09/20   Hilty, Nadean Corwin, MD  citalopram (CELEXA) 40 MG tablet Take 1 tablet (40 mg total) by mouth daily. 08/19/19   Minette Brine, FNP  diclofenac Sodium (VOLTAREN) 1 % GEL APPLY 2 G TO AFFECTED AREA 4 TIMES A DAY 11/13/20   Hilty, Nadean Corwin, MD  doxazosin (CARDURA) 4 MG tablet Take 4 mg by mouth daily.    [provider]  ergocalciferol (VITAMIN D2) 1.25 MG (50000 UT) capsule ergocalciferol (vitamin D2) 1,250 mcg (50,000 unit) capsule  TAKE 1 CAPSULE BY MOUTH ONE TIME PER  WEEK    [provider]  fluticasone (FLONASE) 50 MCG/ACT nasal spray Place 2 sprays into both nostrils daily.    [provider]  furosemide (LASIX) 40 MG tablet Take 1 tablet (40 mg total) by mouth daily. Monday,wednesday and friday 05/15/20   Pixie Casino, MD  mupirocin ointment (BACTROBAN) 2 % Apply 1 application topically 2 (two) times daily. 07/31/20   Lavonna Monarch, MD  nitroGLYCERIN (NITROSTAT) 0.4 MG SL tablet PLACE 1 TABLET UNDER THE TONGUE EVERY 5 MINUTES AS NEEDED FOR CHEST PAIN FOR 3 DOSES Patient taking differently: Place 0.4 mg under the tongue every 5 (five) minutes as needed for chest pain. 10/25/16   Hilty, Nadean Corwin, MD  nystatin (MYCOSTATIN/NYSTOP) powder Apply topically. 06/10/21   [provider]  pregabalin (LYRICA) 75 MG capsule Take 1 capsule (75 mg total) by mouth 3 (three) times daily. 09/08/19 07/10/21  Minette Brine, FNP  simvastatin (ZOCOR) 40 MG tablet TAKE 1 TABLET BY MOUTH AT BEDTIME Patient taking differently: Take 40 mg by mouth at bedtime. 03/18/16   Pixie Casino, MD  Tamsulosin HCl (FLOMAX) 0.4 MG CAPS Take 0.4 mg by mouth daily after supper. 02/04/11   Charlynne Cousins, MD  traMADol Veatrice Bourbon) 50 MG tablet  10/21/19   [provider]  vitamin B-12 1000 MCG tablet Take 1 tablet (1,000 mcg total) by mouth daily. 03/29/19  Geradine Girt, DO      Allergies    Naproxen, Clopidogrel bisulfate, Sulfonamide derivatives, Other, Latex, and Tape    Review of Systems   Review of Systems  Constitutional:  Negative for fever.  HENT:  Negative for facial swelling.   Eyes:  Negative for redness.  Respiratory:  Negative for wheezing and stridor.   Cardiovascular:  Negative for chest pain.  Gastrointestinal:  Positive for abdominal pain. Negative for vomiting.  Genitourinary:  Negative for dysuria.  All other systems reviewed and are negative.   Physical Exam Updated Vital Signs BP (!) 155/78   Pulse 90   Resp (!) 24   Ht 5'  4" (1.626 m)   Wt 92.5 kg   SpO2 95%   BMI 35.02 kg/m  Physical Exam Vitals and nursing note reviewed. Exam conducted with a chaperone present.  Constitutional:      General: He is not in acute distress.    Appearance: He is well-developed. He is not diaphoretic.  HENT:     Head: Normocephalic and atraumatic.  Eyes:     Conjunctiva/sclera: Conjunctivae normal.     Pupils: Pupils are equal, round, and reactive to light.  Cardiovascular:     Rate and Rhythm: Normal rate and regular rhythm.     Pulses: Normal pulses.     Heart sounds: Normal heart sounds.  Pulmonary:     Effort: Pulmonary effort is normal.     Breath sounds: Normal breath sounds. No wheezing or rales.  Abdominal:     General: Bowel sounds are decreased. There is distension.     Tenderness: There is no abdominal tenderness. There is no guarding or rebound.  Musculoskeletal:        General: Normal range of motion.     Cervical back: Normal range of motion and neck supple.  Skin:    General: Skin is warm and dry.     Capillary Refill: Capillary refill takes less than 2 seconds.  Neurological:     General: No focal deficit present.     Mental Status: He is alert and oriented to person, place, and time.  Psychiatric:        Mood and Affect: Mood normal.        Behavior: Behavior normal.     ED Results / Procedures / Treatments   Labs (all labs ordered are listed, but only abnormal results are displayed) Labs Reviewed  CBC WITH DIFFERENTIAL/PLATELET - Abnormal; Notable for the following components:      Result Value   RBC 4.18 (*)    Hemoglobin 12.9 (*)    HCT 38.1 (*)    Platelets 138 (*)    All other components within normal limits  COMPREHENSIVE METABOLIC PANEL - Abnormal; Notable for the following components:   Glucose, Bld 222 (*)    BUN 24 (*)    Creatinine, Ser 1.58 (*)    Total Protein 6.1 (*)    Alkaline Phosphatase 30 (*)    GFR, Estimated 42 (*)    All other components within normal limits   URINALYSIS, ROUTINE W REFLEX MICROSCOPIC - Abnormal; Notable for the following components:   Color, Urine STRAW (*)    Glucose, UA 150 (*)    Leukocytes,Ua TRACE (*)    All other components within normal limits  CULTURE, BLOOD (ROUTINE X 2)  CULTURE, BLOOD (ROUTINE X 2)  LIPASE, BLOOD    EKG EKG Interpretation  Date/Time:  Friday August 10 2021 23:18:12 EDT Ventricular  Rate:  71 PR Interval:  280 QRS Duration: 185 QT Interval:  465 QTC Calculation: 506 R Axis:   -65 Text Interpretation: Sinus rhythm Prolonged PR interval LVH with IVCD, LAD and secondary repol abnrm Prolonged QT interval Confirmed by Dory Horn) on 08/10/2021 11:33:40 PM  Radiology CT Renal Stone Study  Result Date: 08/11/2021 CLINICAL DATA:  Abdominal pain. EXAM: CT ABDOMEN AND PELVIS WITHOUT CONTRAST TECHNIQUE: Multidetector CT imaging of the abdomen and pelvis was performed following the standard protocol without IV contrast. RADIATION DOSE REDUCTION: This exam was performed according to the departmental dose-optimization program which includes automated exposure control, adjustment of the mA and/or kV according to patient size and/or use of iterative reconstruction technique. COMPARISON:  Elyanah Farino 21, 2017 FINDINGS: Lower chest: No acute abnormality. Hepatobiliary: No focal liver abnormality is seen. Status post cholecystectomy. No biliary dilatation. Pancreas: Unremarkable. No pancreatic ductal dilatation or surrounding inflammatory changes. Spleen: Normal in size without focal abnormality. Adrenals/Urinary Tract: Adrenal glands are unremarkable. Kidneys are normal in size, without obstructing renal calculi, focal lesion, or hydronephrosis. 2 mm and 3 mm nonobstructing renal calculi are seen within the left kidney. Bladder is unremarkable. Stomach/Bowel: Stomach is within normal limits. Surgically anastomosed bowel is seen along the expected region of the mid sigmoid colon. Dilated, air-filled loops of ileum are  seen extending to the level of the anastomosis (maximum small bowel diameter of approximately 5.9 cm). While a transition zone is not present a moderate to large amount of stool is seen within the distal sigmoid colon and rectum. A mild to moderate amount of free air is seen adjacent to these bowel loops (axial CT images 21 through 52, CT series 3). Vascular/Lymphatic: Aortic atherosclerosis. No enlarged abdominal or pelvic lymph nodes. Reproductive: Moderate severity calcification is seen within the mildly enlarged prostate gland. Other: No abdominal wall hernia or abnormality. No abdominopelvic ascites. Musculoskeletal: There is a total right hip replacement with associated streak artifact and subsequently limited evaluation of the adjacent osseous and soft tissue structures. Stable, extensive postoperative changes are seen within the mid and lower thoracic spine. IMPRESSION: 1. Mild to moderate amount of peri-intestinal free air, as described above, consistent with bowel perforation. 2. Evidence of prior total colectomy with dilated small bowel loops seen proximal to the site of surgical anastomosis. While this may be, in part, postoperative in origin, a component of obstructed small bowel cannot be excluded. 3. Evidence of prior cholecystectomy. 4. Multiple nonobstructing left renal calculi. 5. Aortic atherosclerosis. 6. Total right hip replacement. 7. Stable postoperative changes within the mid and lower thoracic spine. Aortic Atherosclerosis (ICD10-I70.0). Electronically Signed   By: Virgina Norfolk M.D.   On: 08/11/2021 00:33    Procedures Procedures    Medications Ordered in ED Medications  piperacillin-tazobactam (ZOSYN) IVPB 3.375 g (has no administration in time range)    ED Course/ Medical Decision Making/ A&P                           Medical Decision Making Patient with one hour of abdominal pain   Problems Addressed: Pneumoperitoneum: acute illness or injury    Details: consult  general surgery NPO and antibiotics given.  cultured  Amount and/or Complexity of Data Reviewed Independent Historian: EMS    Details: see above External Data Reviewed: notes.    Details: previous notes reviewed Labs: ordered.    Details: All labs reviewed:  Normal white count, hemoglobin low 12.9 and platelets low 129K,  normal sodium and potassium with elevated BUN 24 and creatinine 1.58 and nor UTI Radiology: ordered and independent interpretation performed.    Details: free air by me on CT  Risk Prescription drug management. Parenteral controlled substances. Decision regarding hospitalization.    Final Clinical Impression(s) / ED Diagnoses Final diagnoses:  Pneumoperitoneum   The patient appears reasonably stabilized for admission considering the current resources, flow, and capabilities available in the ED at this time, and I doubt any other [facility] requiring further screening and/or treatment in the ED prior to admission.  Rx / DC Orders ED Discharge Orders     None         Mehtaab Mayeda, MD 08/11/21 1771

## 2021-08-11 NOTE — H&P (Signed)
History and Physical    Gary [name] QAE:[id number] DOB: [birth year] DOA: 08/10/2021  PCP: Clovia Cuff, MD   Patient coming from: Home   Chief Complaint: Abdominal pain   HPI: Gary [name] is a pleasant 88 y.o. male with medical history significant for hypertension, type 2 diabetes mellitus, heart block with pacer, chronic diastolic CHF, OSA with CPAP intolerance, and subtotal colectomy bleeding in 2011, now presenting to the emergency department with abdominal pain.  Patient reports that he had been in his usual state of health until cute onset of abdominal pain shortly after finishing dinner.  Pain was difficult to localize, similar to frequent episodes that he has been more severe, and then relieved with a large bowel movement.  He denies any fevers, chills, melena, or hematochezia.  No nausea or vomiting.  ED Course: Upon arrival to the ED, patient is found to be saturating well on room air with heart rate 90 and systolic blood pressure 102T.  CT was concerning for mild to moderate amount of periintestinal free air consistent with bowel perforation, as well as dilated small bowel loops proximal to surgical anastomosis which could be postoperative or due to small bowel obstruction.  Patient was treated with fentanyl and Zosyn in the emergency department and surgery was consulted by the ED physician.  Review of Systems:  All other systems reviewed and apart from HPI, are negative.  Past Medical History:  Diagnosis Date   Adenomatous colon polyp    2010    Anemia    Anxiety    Arthritis    "hips and lower back"   Arthritis pain    Atypical mole 06/21/2003   left neck, inferior - slight to moderate atypia   Basal cell carcinoma 10/04/2014   right shin bcc (tx cx3, 39fu )   Blood transfusion    Chest pain 03/29/2011   2D Echo without contrast on 03/29/11 - EF= 55-60%.   Cholecystitis    Chronic back pain greater than 3 months duration    Chronic kidney disease    Colon polyps     Complication of anesthesia    once slow to wake up   Coronary angioplasty status 2011   Coronary artery disease    Diarrhea    Diverticulosis    Dyslipidemia    GERD (gastroesophageal reflux disease)    GI bleeding after 07/2009   "triggered by Plavix & ASA; from my diverticulosis"   H/O Clostridium difficile infection    H/O myocardial perfusion scan 04/04/2011   For Pre-noncardiac surgery - EF = 67%, no ischemia, and is considered low risk.   Heart attack ([place]) 07/2009   nonSTEMI with an occluded circumflex artery and collaterals.   Heart murmur    Hemorrhoids    History of shingles    Hyperlipidemia    Hypertension    Kidney stones    Lower extremity edema    Taking furosemide and it seems to be helping.   Neuromuscular disorder ([place])    Nodular infiltrative basal cell carcinoma (BCC) 02/08/2021   Right Forearm-anterior (tx p bx)   Nonspecific chest pain 08/26/2012   Went to ED 08/26/12   Parkinsonism ([place])    SCCA (squamous cell carcinoma) of skin 02/08/2021   Right Dorsal Hand (well diff) (tx p bx)   Squamous cell carcinoma of skin 05/13/2000   Right outer forehead   Squamous cell carcinoma of skin 03/02/2001   Post crown - tx 03/19/2001   Squamous cell carcinoma of  skin 09/01/2001   Left post auricular - tx 10/09/2001   Squamous cell carcinoma of skin 06/21/2003   Top right ear - tx 07/05/2003   Squamous cell carcinoma of skin 06/21/2003   Right upper arm - tx 07/05/2003   Squamous cell carcinoma of skin 10/12/2003   Left superior ear lobe - MOHs   Squamous cell carcinoma of skin 12/15/2006   Right ear, superior - tx 01/26/2007   Squamous cell carcinoma of skin 03/13/2009   Right temple - tx 05/08/2009   Squamous cell carcinoma of skin 03/24/2013   Upper left forearm - tx p bx   Squamous cell carcinoma of skin 12/06/2013   Right forearm - CX3 + 5FU   Squamous cell carcinoma of skin 02/10/2014   Right hand - tx p bx   Squamous cell carcinoma of skin  10/04/2014   Right shin - tx p bx   Squamous cell carcinoma of skin 08/21/2016   Left hand - tx p bx   Squamous cell carcinoma of skin 01/01/2017   Right hand - tx 02/06/2017   Squamous cell carcinoma of skin 10/21/2017   Left side of scalp - tx 03/12/2018   Squamous cell carcinoma of skin 10/21/2017   Left post scalp, inf - tx 08/13/2018   Squamous cell carcinoma of skin 10/21/2017   Right scalp - tx p bx   Squamous cell carcinoma of skin 10/21/2017   Left cheek - tx 03/12/2018   Squamous cell carcinoma of skin 08/13/2018   Left top hand, proximal pinky - tx p bx   Squamous cell carcinoma of skin 12/23/2018   Right bicep lower - tx p bx   Squamous cell carcinoma of skin 05/18/2019   Right 4th finger metacarpophalangeal joint   Squamous cell carcinoma of skin 05/18/2019   Left 5th finger metacarpophalangeal joint   Squamous cell carcinoma of skin 05/18/2019   Left 2nd finger metacarpophalangeal joint   Stomach problems    Syncope and collapse 04/16/2018    Past Surgical History:  Procedure Laterality Date   [place]  08/09/2009   Circumflex 100% occluded with right-to-left collaterals, mild irregularities in the proximal and distal LAD and diagonal system, normal LV systolic function, and minor infrarenal irregularities, but no abdominal aortic aneurysm.   CHOLECYSTECTOMY  04/12/2011   Procedure: CHOLECYSTECTOMY;  Surgeon: Adin Hector, MD;  Location: [facility];  Service: General;  Laterality: N/A;   CHOLECYSTECTOMY  04/12/2011   Procedure: LAPAROSCOPIC CHOLECYSTECTOMY;  Surgeon: Adin Hector, MD;  Location: [facility];  Service: General;  Laterality: N/A;  converted to open   COLON SURGERY     COLONOSCOPY   ESOPHAGOGASTRODUODENOSCOPY N/A 08/05/2013   Procedure: ESOPHAGOGASTRODUODENOSCOPY (EGD);  Surgeon: Jerene Bears, MD;  Location: [facility];  Service: Gastroenterology;  Laterality: N/A;   EYE SURGERY  1942   "right eye was crossed; they   corrected it"   LUMBAR FUSION  07/22/2012   PACEMAKER IMPLANT N/A 04/17/2018   Procedure: PACEMAKER IMPLANT;  Surgeon: Deboraha Sprang, MD;  Location: [facility];  Service: Cardiovascular;  Laterality: N/A;   POSTERIOR FUSION LUMBAR SPINE  08/1995   "bottom 4 vertebra"   SUBTOTAL COLECTOMY  01/24/2010   TOTAL HIP ARTHROPLASTY Right 08/03/2013   Procedure: TOTAL HIP ARTHROPLASTY ANTERIOR APPROACH;  Surgeon: Hessie Dibble, MD;  Location: [facility];  Service: Orthopedics;  Laterality: Right;    Social History:   reports that he has  never smoked. He has never used smokeless tobacco. He reports that he does not drink alcohol and does not use drugs.  Allergies  Allergen Reactions   Naproxen Rash   Clopidogrel Bisulfate Other (See Comments)    H/O diverticulitis; prone to rectal bleeding.  Plavix complicated this.  jkl   Sulfonamide Derivatives Hives   Other Other (See Comments)    Narcotics-C. Diff   Latex Rash   Tape Itching and Rash    Latex-Adhesive tape    Family History  Problem Relation Age of Onset   Pancreatic cancer Mother    Heart disease Father    Stroke Father    Diverticulosis Sister        x4   Colon cancer Neg Hx      Prior to Admission medications   Medication Sig Start Date End Date Taking? Authorizing Provider  acetaminophen (TYLENOL) 500 MG tablet Take 1,000 mg by mouth every 8 (eight) hours as needed for fever.    [provider]  amLODipine (NORVASC) 5 MG tablet TAKE 1 TABLET BY MOUTH EVERY DAY 11/13/20   Hilty, Nadean Corwin, MD  aspirin EC 81 MG tablet Take 81 mg by mouth daily.    [provider]  blood glucose meter kit and supplies KIT Dispense based on patient and insurance preference. Use up to four times daily as directed. (FOR ICD-9 250.00, 250.01). 03/29/19   Eulogio Bear U, DO  carvedilol (COREG) 3.125 MG tablet TAKE 1 TABLET (3.125 MG TOTAL) BY MOUTH 2 (TWO) TIMES DAILY WITH A MEAL. 10/09/20   Hilty, Nadean Corwin, MD  citalopram  (CELEXA) 40 MG tablet Take 1 tablet (40 mg total) by mouth daily. 08/19/19   Minette Brine, FNP  diclofenac Sodium (VOLTAREN) 1 % GEL APPLY 2 G TO AFFECTED AREA 4 TIMES A DAY 11/13/20   Hilty, Nadean Corwin, MD  doxazosin (CARDURA) 4 MG tablet Take 4 mg by mouth daily.    [provider]  ergocalciferol (VITAMIN D2) 1.25 MG (50000 UT) capsule ergocalciferol (vitamin D2) 1,250 mcg (50,000 unit) capsule  TAKE 1 CAPSULE BY MOUTH ONE TIME PER WEEK    [provider]  fluticasone (FLONASE) 50 MCG/ACT nasal spray Place 2 sprays into both nostrils daily.    [provider]  furosemide (LASIX) 40 MG tablet Take 1 tablet (40 mg total) by mouth daily. Monday,wednesday and friday 05/15/20   Pixie Casino, MD  mupirocin ointment (BACTROBAN) 2 % Apply 1 application topically 2 (two) times daily. 07/31/20   Lavonna Monarch, MD  nitroGLYCERIN (NITROSTAT) 0.4 MG SL tablet PLACE 1 TABLET UNDER THE TONGUE EVERY 5 MINUTES AS NEEDED FOR CHEST PAIN FOR 3 DOSES Patient taking differently: Place 0.4 mg under the tongue every 5 (five) minutes as needed for chest pain. 10/25/16   Hilty, Nadean Corwin, MD  nystatin (MYCOSTATIN/NYSTOP) powder Apply topically. 06/10/21   [provider]  pregabalin (LYRICA) 75 MG capsule Take 1 capsule (75 mg total) by mouth 3 (three) times daily. 09/08/19 07/10/21  Minette Brine, FNP  simvastatin (ZOCOR) 40 MG tablet TAKE 1 TABLET BY MOUTH AT BEDTIME Patient taking differently: Take 40 mg by mouth at bedtime. 03/18/16   Pixie Casino, MD  Tamsulosin HCl (FLOMAX) 0.4 MG CAPS Take 0.4 mg by mouth daily after supper. 02/04/11   Charlynne Cousins, MD  traMADol Veatrice Bourbon) 50 MG tablet  10/21/19   [provider]  vitamin B-12 1000 MCG tablet Take 1 tablet (1,000 mcg total) by mouth  daily. 03/29/19   Geradine Girt, DO    Physical Exam: Vitals:   08/10/21 2325 08/10/21 2330  BP:  (!) 155/78  Pulse:  90  Resp:  (!) 24  SpO2:  95%  Weight: 92.5 kg   Height: 5'  4" (1.626 m)     Constitutional: NAD, calm  Eyes: PERTLA, lids and conjunctivae normal ENMT: Mucous membranes are moist. Posterior pharynx clear of any exudate or lesions.   Neck: supple, no masses  Respiratory: no wheezing, no crackles. No accessory muscle use.  Cardiovascular: S1 & S2 heard, regular rate and rhythm. Mild pretibial edema.  Abdomen: Soft, no rebound pain or guarding. Bowel sounds active.  Musculoskeletal: no clubbing / cyanosis. No joint deformity upper and lower extremities.   Skin: no significant rashes, lesions, ulcers. Warm, dry, well-perfused. Neurologic: CN 2-12 grossly intact. Moving all extremities. Alert and oriented to person, place, and situation.  Psychiatric: Pleasant. Cooperative.    Labs and Imaging on Admission: I have personally reviewed following labs and imaging studies  CBC: Recent Labs  Lab 08/10/21 2340  WBC 5.6  NEUTROABS 4.1  HGB 12.9*  HCT 38.1*  MCV 91.1  PLT 163*   Basic Metabolic Panel: Recent Labs  Lab 08/10/21 2340  NA 140  K 4.1  CL 101  CO2 27  GLUCOSE 222*  BUN 24*  CREATININE 1.58*  CALCIUM 9.3   GFR: Estimated Creatinine Clearance: 33.8 mL/min (A) (by C-G formula based on SCr of 1.58 mg/dL (H)). Liver Function Tests: Recent Labs  Lab 08/10/21 2340  AST 19  ALT 14  ALKPHOS 30*  BILITOT 0.8  PROT 6.1*  ALBUMIN 3.8   Recent Labs  Lab 08/10/21 2340  LIPASE 39   No results for input(s): "AMMONIA" in the last 168 hours. Coagulation Profile: No results for input(s): "INR", "PROTIME" in the last 168 hours. Cardiac Enzymes: No results for input(s): "CKTOTAL", "CKMB", "CKMBINDEX", "TROPONINI" in the last 168 hours. BNP (last 3 results) No results for input(s): "PROBNP" in the last 8760 hours. HbA1C: No results for input(s): "HGBA1C" in the last 72 hours. CBG: No results for input(s): "GLUCAP" in the last 168 hours. Lipid Profile: No results for input(s): "CHOL", "HDL", "LDLCALC", "TRIG", "CHOLHDL",  "LDLDIRECT" in the last 72 hours. Thyroid Function Tests: No results for input(s): "TSH", "T4TOTAL", "FREET4", "T3FREE", "THYROIDAB" in the last 72 hours. Anemia Panel: No results for input(s): "VITAMINB12", "FOLATE", "FERRITIN", "TIBC", "IRON", "RETICCTPCT" in the last 72 hours. Urine analysis:    Component Value Date/Time   COLORURINE STRAW (A) 08/10/2021 2351   APPEARANCEUR CLEAR 08/10/2021 2351   LABSPEC 1.006 08/10/2021 2351   PHURINE 6.0 08/10/2021 2351   GLUCOSEU 150 (A) 08/10/2021 2351   HGBUR NEGATIVE 08/10/2021 2351   BILIRUBINUR NEGATIVE 08/10/2021 2351   KETONESUR NEGATIVE 08/10/2021 2351   PROTEINUR NEGATIVE 08/10/2021 2351   UROBILINOGEN 0.2 08/13/2013 1422   NITRITE NEGATIVE 08/10/2021 2351   LEUKOCYTESUR TRACE (A) 08/10/2021 2351   Sepsis Labs: $RemoveBefo'@[email or web address]'[id number]$ (procalcitonin:4,lacticidven:4) )No results found for this or any previous visit (from the past 240 hour(s)).   Radiological Exams on Admission: DG ABD ACUTE 2+V W 1V CHEST  Result Date: 08/11/2021 CLINICAL DATA:  Abdominal pain, free air on recent CT examination EXAM: DG ABDOMEN ACUTE WITH 1 VIEW CHEST COMPARISON:  CT from earlier in the same day. FINDINGS: Scattered large and small bowel gas is noted. Upright film and decubitus views show some mild lucency suggestive of free air. This is less prominent than that seen  on recent CT examination. Postsurgical changes in the lumbosacral spine are noted. Cardiac shadow is enlarged. Aortic calcifications are noted. Lungs are well aerated without focal infiltrate. IMPRESSION: Minimal free air somewhat less than that seen on recent CT examination. Electronically Signed   By: Inez Catalina M.D.   On: 08/11/2021 03:06   CT Renal Stone Study  Addendum Date: 08/11/2021   ADDENDUM REPORT: 08/11/2021 00:50 ADDENDUM: Results were discussed with Dr. Randal Buba at 12:50 a.m. [place] on August 11, 2021. Electronically Signed   By: Virgina Norfolk M.D.   On: 08/11/2021 00:50   Result Date:  08/11/2021 CLINICAL DATA:  Abdominal pain. EXAM: CT ABDOMEN AND PELVIS WITHOUT CONTRAST TECHNIQUE: Multidetector CT imaging of the abdomen and pelvis was performed following the standard protocol without IV contrast. RADIATION DOSE REDUCTION: This exam was performed according to the departmental dose-optimization program which includes automated exposure control, adjustment of the mA and/or kV according to patient size and/or use of iterative reconstruction technique. COMPARISON:  June 02, 2015 FINDINGS: Lower chest: No acute abnormality. Hepatobiliary: No focal liver abnormality is seen. Status post cholecystectomy. No biliary dilatation. Pancreas: Unremarkable. No pancreatic ductal dilatation or surrounding inflammatory changes. Spleen: Normal in size without focal abnormality. Adrenals/Urinary Tract: Adrenal glands are unremarkable. Kidneys are normal in size, without obstructing renal calculi, focal lesion, or hydronephrosis. 2 mm and 3 mm nonobstructing renal calculi are seen within the left kidney. Bladder is unremarkable. Stomach/Bowel: Stomach is within normal limits. Surgically anastomosed bowel is seen along the expected region of the mid sigmoid colon. Dilated, air-filled loops of ileum are seen extending to the level of the anastomosis (maximum small bowel diameter of approximately 5.9 cm). While a transition zone is not present a moderate to large amount of stool is seen within the distal sigmoid colon and rectum. A mild to moderate amount of free air is seen adjacent to these bowel loops (axial CT images 21 through 52, CT series 3). Vascular/Lymphatic: Aortic atherosclerosis. No enlarged abdominal or pelvic lymph nodes. Reproductive: Moderate severity calcification is seen within the mildly enlarged prostate gland. Other: No abdominal wall hernia or abnormality. No abdominopelvic ascites. Musculoskeletal: There is a total right hip replacement with associated streak artifact and subsequently limited  evaluation of the adjacent osseous and soft tissue structures. Stable, extensive postoperative changes are seen within the mid and lower thoracic spine. IMPRESSION: 1. Mild to moderate amount of peri-intestinal free air, as described above, consistent with bowel perforation. 2. Evidence of prior total colectomy with dilated small bowel loops seen proximal to the site of surgical anastomosis. While this may be, in part, postoperative in origin, a component of obstructed small bowel cannot be excluded. 3. Evidence of prior cholecystectomy. 4. Multiple nonobstructing left renal calculi. 5. Aortic atherosclerosis. 6. Total right hip replacement. 7. Stable postoperative changes within the mid and lower thoracic spine. Aortic Atherosclerosis (ICD10-I70.0). Electronically Signed: By: Virgina Norfolk M.D. On: 08/11/2021 00:33    EKG: Independently reviewed. Paced rhythm.   Assessment/Plan   1. Abdominal pain, ?bowel perforation  - Presents after an episode of severe abdominal pain that was relieved after he moved his bowels and has completely resolved but has CT findings concerning for bowel perforation  - Appreciate surgery consultation, will continue bowel rest and Zosyn     2. CAD - Hx of NSTEMI in 2011 management medically  - No anginal complaints   3. CKD IIIb  - SCr is 1.58 on admission, similar to priors  - Renally-dose medications, monitor  4. Hypertension  - Treat as-needed only for now    DVT prophylaxis: SCDs  Code Status: Full, discussed with patient and his wife on admission - he'd like a brief attempt at resuscitation  Level of Care: Level of care: Telemetry Medical Family Communication: Wife at bedside   Disposition Plan:  Patient is from: home  Anticipated d/c is to: TBD Anticipated d/c date is: 08/13/21 Patient currently: Pending serial abdominal exams Consults called: Surgery  Admission status: Inpatient     Vianne Bulls, MD [facility]  08/11/2021, 5:37 AM

## 2021-08-11 NOTE — ED Notes (Signed)
Transport requested

## 2021-08-11 NOTE — Consult Note (Signed)
Surgical Evaluation Requesting provider: April Palumbo, MD  Chief Complaint: Abdominal pain  HPI: 88 year old man with multiple medical problems as listed below including multiple cardiac issues, with a complex surgical history who presents with acute onset abdominal pain that began about an hour prior to presentation.  He has a history of a subtotal colectomy for an localized GI bleeding in 2011.  He subsequently had a laparoscopic lysis of adhesions and conversion to open cholecystectomy in 2013 by Dr. Dalbert Batman.  He has a history of recurrent C. difficile, which seems to flareup anytime he has had surgery.  He reports that ever since his subtotal colectomy, he will intermittently have a sensation of pain and pressure in his epigastrium/lower chest and occasionally mid abdomen, and given his cardiac history he can never really discern whether the pain is abdominal or chest pain and he is afraid not to come in.  He had a similar such episode this evening.  This began about 10 minutes after finishing his dinner of cereal with blueberries, which was little after 9 PM.  He states that this pain was similar to his previous multiple episodes, and describes a pressure sensation.   After being evaluated in the emergency room, he reports that he passed a lot of gas and had a bowel movement and at the time of this consultation, he reports that he is no longer in pain at all.  He underwent a CT scan with report below, that was concerning for pneumoperitoneum.  General surgery was consulted for further management.  Allergies  Allergen Reactions   Naproxen Rash   Clopidogrel Bisulfate Other (See Comments)    H/O diverticulitis; prone to rectal bleeding.  Plavix complicated this.  jkl   Sulfonamide Derivatives Hives   Other Other (See Comments)    Narcotics-C. Diff   Latex Rash   Tape Itching and Rash    Latex-Adhesive tape    Past Medical History:  Diagnosis Date   Adenomatous colon polyp    2010     Anemia    Anxiety    Arthritis    "hips and lower back"   Arthritis pain    Atypical mole 06/21/2003   left neck, inferior - slight to moderate atypia   Basal cell carcinoma 10/04/2014   right shin bcc (tx cx3, 38fu )   Blood transfusion    Chest pain 03/29/2011   2D Echo without contrast on 03/29/11 - EF= 55-60%.   Cholecystitis    Chronic back pain greater than 3 months duration    Chronic kidney disease    Colon polyps    Complication of anesthesia    once slow to wake up   Coronary angioplasty status 2011   Coronary artery disease    Diarrhea    Diverticulosis    Dyslipidemia    GERD (gastroesophageal reflux disease)    GI bleeding after 07/2009   "triggered by Plavix & ASA; from my diverticulosis"   H/O Clostridium difficile infection    H/O myocardial perfusion scan 04/04/2011   For Pre-noncardiac surgery - EF = 67%, no ischemia, and is considered low risk.   Heart attack ([place]) 07/2009   nonSTEMI with an occluded circumflex artery and collaterals.   Heart murmur    Hemorrhoids    History of shingles    Hyperlipidemia    Hypertension    Kidney stones    Lower extremity edema    Taking furosemide and it seems to be helping.   Neuromuscular disorder ([place])  Nodular infiltrative basal cell carcinoma (BCC) 02/08/2021   Right Forearm-anterior (tx p bx)   Nonspecific chest pain 08/26/2012   Went to ED 08/26/12   Parkinsonism ([place])    SCCA (squamous cell carcinoma) of skin 02/08/2021   Right Dorsal Hand (well diff) (tx p bx)   Squamous cell carcinoma of skin 05/13/2000   Right outer forehead   Squamous cell carcinoma of skin 03/02/2001   Post crown - tx 03/19/2001   Squamous cell carcinoma of skin 09/01/2001   Left post auricular - tx 10/09/2001   Squamous cell carcinoma of skin 06/21/2003   Top right ear - tx 07/05/2003   Squamous cell carcinoma of skin 06/21/2003   Right upper arm - tx 07/05/2003   Squamous cell carcinoma of skin 10/12/2003   Left superior ear  lobe - MOHs   Squamous cell carcinoma of skin 12/15/2006   Right ear, superior - tx 01/26/2007   Squamous cell carcinoma of skin 03/13/2009   Right temple - tx 05/08/2009   Squamous cell carcinoma of skin 03/24/2013   Upper left forearm - tx p bx   Squamous cell carcinoma of skin 12/06/2013   Right forearm - CX3 + 5FU   Squamous cell carcinoma of skin 02/10/2014   Right hand - tx p bx   Squamous cell carcinoma of skin 10/04/2014   Right shin - tx p bx   Squamous cell carcinoma of skin 08/21/2016   Left hand - tx p bx   Squamous cell carcinoma of skin 01/01/2017   Right hand - tx 02/06/2017   Squamous cell carcinoma of skin 10/21/2017   Left side of scalp - tx 03/12/2018   Squamous cell carcinoma of skin 10/21/2017   Left post scalp, inf - tx 08/13/2018   Squamous cell carcinoma of skin 10/21/2017   Right scalp - tx p bx   Squamous cell carcinoma of skin 10/21/2017   Left cheek - tx 03/12/2018   Squamous cell carcinoma of skin 08/13/2018   Left top hand, proximal pinky - tx p bx   Squamous cell carcinoma of skin 12/23/2018   Right bicep lower - tx p bx   Squamous cell carcinoma of skin 05/18/2019   Right 4th finger metacarpophalangeal joint   Squamous cell carcinoma of skin 05/18/2019   Left 5th finger metacarpophalangeal joint   Squamous cell carcinoma of skin 05/18/2019   Left 2nd finger metacarpophalangeal joint   Stomach problems    Syncope and collapse 04/16/2018    Past Surgical History:  Procedure Laterality Date   [place]  08/09/2009   Circumflex 100% occluded with right-to-left collaterals, mild irregularities in the proximal and distal LAD and diagonal system, normal LV systolic function, and minor infrarenal irregularities, but no abdominal aortic aneurysm.   CHOLECYSTECTOMY  04/12/2011   Procedure: CHOLECYSTECTOMY;  Surgeon: Adin Hector, MD;  Location: [facility];  Service: General;  Laterality: N/A;   CHOLECYSTECTOMY   04/12/2011   Procedure: LAPAROSCOPIC CHOLECYSTECTOMY;  Surgeon: Adin Hector, MD;  Location: [facility];  Service: General;  Laterality: N/A;  converted to open   COLON SURGERY     COLONOSCOPY   ESOPHAGOGASTRODUODENOSCOPY N/A 08/05/2013   Procedure: ESOPHAGOGASTRODUODENOSCOPY (EGD);  Surgeon: Jerene Bears, MD;  Location: [facility];  Service: Gastroenterology;  Laterality: N/A;   EYE SURGERY  1942   "right eye was crossed; they  corrected it"   LUMBAR FUSION  07/22/2012   PACEMAKER IMPLANT N/A 04/17/2018  Procedure: PACEMAKER IMPLANT;  Surgeon: Deboraha Sprang, MD;  Location: [facility];  Service: Cardiovascular;  Laterality: N/A;   POSTERIOR FUSION LUMBAR SPINE  08/1995   "bottom 4 vertebra"   SUBTOTAL COLECTOMY  01/24/2010   TOTAL HIP ARTHROPLASTY Right 08/03/2013   Procedure: TOTAL HIP ARTHROPLASTY ANTERIOR APPROACH;  Surgeon: Hessie Dibble, MD;  Location: [facility];  Service: Orthopedics;  Laterality: Right;    Family History  Problem Relation Age of Onset   Pancreatic cancer Mother    Heart disease Father    Stroke Father    Diverticulosis Sister        x4   Colon cancer Neg Hx     Social History   Socioeconomic History   Marital status: Married    Spouse name: Ann   Number of children: 2   Years of education: Not on file   Highest education level: Some college, no degree  Occupational History   Occupation: Art therapist   Occupation: retired    Comment: Company secretary  Tobacco Use   Smoking status: Never   Smokeless tobacco: Never  Scientific laboratory technician Use: Never used  Substance and Sexual Activity   Alcohol use: No   Drug use: No   Sexual activity: Not Currently  Other Topics Concern   Not on file  Social History Narrative   Lives with wife   Daily caffeine use.   Social Determinants of Health   Financial Resource Strain: Low Risk  (07/07/2019)   Overall Financial Resource Strain (CARDIA)    Difficulty of Paying Living Expenses: Not hard at all  Food  Insecurity: No Food Insecurity (07/07/2019)   Hunger Vital Sign    Worried About Running Out of Food in the Last Year: Never true    Ran Out of Food in the Last Year: Never true  Transportation Needs: No Transportation Needs (07/07/2019)   PRAPARE - Hydrologist (Medical): No    Lack of Transportation (Non-Medical): No  Physical Activity: Sufficiently Active (07/07/2019)   Exercise Vital Sign    Days of Exercise per Week: 7 days    Minutes of Exercise per Session: 30 min  Stress: No Stress Concern Present (07/07/2019)   [place]    Feeling of Stress : Not at all  Social Connections: Not on file    No current facility-administered medications on file prior to encounter.   Current Outpatient Medications on File Prior to Encounter  Medication Sig Dispense Refill   acetaminophen (TYLENOL) 500 MG tablet Take 1,000 mg by mouth every 8 (eight) hours as needed for fever.     amLODipine (NORVASC) 5 MG tablet TAKE 1 TABLET BY MOUTH EVERY DAY 90 tablet 3   aspirin EC 81 MG tablet Take 81 mg by mouth daily.     blood glucose meter kit and supplies KIT Dispense based on patient and insurance preference. Use up to four times daily as directed. (FOR ICD-9 250.00, 250.01). 1 each 0   carvedilol (COREG) 3.125 MG tablet TAKE 1 TABLET (3.125 MG TOTAL) BY MOUTH 2 (TWO) TIMES DAILY WITH A MEAL. 180 tablet 1   citalopram (CELEXA) 40 MG tablet Take 1 tablet (40 mg total) by mouth daily. 90 tablet 1   diclofenac Sodium (VOLTAREN) 1 % GEL APPLY 2 G TO AFFECTED AREA 4 TIMES A DAY 200 g 0   doxazosin (CARDURA) 4 MG tablet Take 4 mg by mouth  daily.     ergocalciferol (VITAMIN D2) 1.25 MG (50000 UT) capsule ergocalciferol (vitamin D2) 1,250 mcg (50,000 unit) capsule  TAKE 1 CAPSULE BY MOUTH ONE TIME PER WEEK     fluticasone (FLONASE) 50 MCG/ACT nasal spray Place 2 sprays into both nostrils daily.     furosemide (LASIX) 40  MG tablet Take 1 tablet (40 mg total) by mouth daily. Monday,wednesday and friday     mupirocin ointment (BACTROBAN) 2 % Apply 1 application topically 2 (two) times daily. 22 g 2   nitroGLYCERIN (NITROSTAT) 0.4 MG SL tablet PLACE 1 TABLET UNDER THE TONGUE EVERY 5 MINUTES AS NEEDED FOR CHEST PAIN FOR 3 DOSES (Patient taking differently: Place 0.4 mg under the tongue every 5 (five) minutes as needed for chest pain.) 25 tablet 0   nystatin (MYCOSTATIN/NYSTOP) powder Apply topically.     pregabalin (LYRICA) 75 MG capsule Take 1 capsule (75 mg total) by mouth 3 (three) times daily. 90 capsule 3   simvastatin (ZOCOR) 40 MG tablet TAKE 1 TABLET BY MOUTH AT BEDTIME (Patient taking differently: Take 40 mg by mouth at bedtime.) 90 tablet 2   Tamsulosin HCl (FLOMAX) 0.4 MG CAPS Take 0.4 mg by mouth daily after supper.     traMADol (ULTRAM) 50 MG tablet      vitamin B-12 1000 MCG tablet Take 1 tablet (1,000 mcg total) by mouth daily. 3 tablet     Review of Systems: a complete, 10pt review of systems was completed with pertinent positives and negatives as documented in the HPI  Physical Exam: Vitals:   08/10/21 2330  BP: (!) 155/78  Pulse: 90  Resp: (!) 24  SpO2: 95%   Gen: A&Ox3, no distress  Eyes: lids and conjunctivae normal, no icterus. Pupils equally round and reactive to light.  Neck: supple without mass or thyromegaly Chest: respiratory effort is normal. No crepitus or tenderness on palpation of the chest. Breath sounds equal.  Cardiovascular: RRR with palpable distal pulses Gastrointestinal: soft, mildly distended, nontender.  Well-healed laparotomy scars. Muscoloskeletal: no clubbing or cyanosis of the fingers.  Strength is symmetrical throughout.  Range of motion of bilateral upper and lower extremities normal without pain, crepitation or contracture. Neuro: cranial nerves grossly intact.  Sensation intact to light touch diffusely. Psych: appropriate mood and affect, normal insight/judgment  intact  Skin: warm and dry      Latest Ref Rng & Units 08/10/2021   11:40 PM 07/20/2021   12:20 PM 08/15/2020    7:51 AM  CBC  WBC 4.0 - 10.5 K/uL 5.6  8.6  6.8   Hemoglobin 13.0 - 17.0 g/dL 12.9  13.3  13.1   Hematocrit 39.0 - 52.0 % 38.1  40.7  40.5   Platelets 150 - 400 K/uL 138  166  157        Latest Ref Rng & Units 08/10/2021   11:40 PM 07/20/2021   12:20 PM 08/15/2020   11:19 AM  CMP  Glucose 70 - 99 mg/dL 222  113    BUN 8 - 23 mg/dL 24  32    Creatinine 0.61 - 1.24 mg/dL 1.58  1.66    Sodium 135 - 145 mmol/L 140  139    Potassium 3.5 - 5.1 mmol/L 4.1  4.6    Chloride 98 - 111 mmol/L 101  102    CO2 22 - 32 mmol/L 27  30    Calcium 8.9 - 10.3 mg/dL 9.3  9.4    Total Protein 6.5 -  8.1 g/dL 6.1   6.6   Total Bilirubin 0.3 - 1.2 mg/dL 0.8   1.0   Alkaline Phos 38 - 126 U/L 30   30   AST 15 - 41 U/L 19   22   ALT 0 - 44 U/L 14   12     Lab Results  Component Value Date   INR 1.21 08/05/2013   INR 1.01 07/26/2013   INR 0.98 07/15/2012    Imaging: CT Renal Stone Study  Addendum Date: 08/11/2021   ADDENDUM REPORT: 08/11/2021 00:50 ADDENDUM: Results were discussed with Dr. Randal Buba at 12:50 a.m. [place] on August 11, 2021. Electronically Signed   By: Virgina Norfolk M.D.   On: 08/11/2021 00:50   Result Date: 08/11/2021 CLINICAL DATA:  Abdominal pain. EXAM: CT ABDOMEN AND PELVIS WITHOUT CONTRAST TECHNIQUE: Multidetector CT imaging of the abdomen and pelvis was performed following the standard protocol without IV contrast. RADIATION DOSE REDUCTION: This exam was performed according to the departmental dose-optimization program which includes automated exposure control, adjustment of the mA and/or kV according to patient size and/or use of iterative reconstruction technique. COMPARISON:  June 02, 2015 FINDINGS: Lower chest: No acute abnormality. Hepatobiliary: No focal liver abnormality is seen. Status post cholecystectomy. No biliary dilatation. Pancreas: Unremarkable. No pancreatic  ductal dilatation or surrounding inflammatory changes. Spleen: Normal in size without focal abnormality. Adrenals/Urinary Tract: Adrenal glands are unremarkable. Kidneys are normal in size, without obstructing renal calculi, focal lesion, or hydronephrosis. 2 mm and 3 mm nonobstructing renal calculi are seen within the left kidney. Bladder is unremarkable. Stomach/Bowel: Stomach is within normal limits. Surgically anastomosed bowel is seen along the expected region of the mid sigmoid colon. Dilated, air-filled loops of ileum are seen extending to the level of the anastomosis (maximum small bowel diameter of approximately 5.9 cm). While a transition zone is not present a moderate to large amount of stool is seen within the distal sigmoid colon and rectum. A mild to moderate amount of free air is seen adjacent to these bowel loops (axial CT images 21 through 52, CT series 3). Vascular/Lymphatic: Aortic atherosclerosis. No enlarged abdominal or pelvic lymph nodes. Reproductive: Moderate severity calcification is seen within the mildly enlarged prostate gland. Other: No abdominal wall hernia or abnormality. No abdominopelvic ascites. Musculoskeletal: There is a total right hip replacement with associated streak artifact and subsequently limited evaluation of the adjacent osseous and soft tissue structures. Stable, extensive postoperative changes are seen within the mid and lower thoracic spine. IMPRESSION: 1. Mild to moderate amount of peri-intestinal free air, as described above, consistent with bowel perforation. 2. Evidence of prior total colectomy with dilated small bowel loops seen proximal to the site of surgical anastomosis. While this may be, in part, postoperative in origin, a component of obstructed small bowel cannot be excluded. 3. Evidence of prior cholecystectomy. 4. Multiple nonobstructing left renal calculi. 5. Aortic atherosclerosis. 6. Total right hip replacement. 7. Stable postoperative changes within  the mid and lower thoracic spine. Aortic Atherosclerosis (ICD10-I70.0). Electronically Signed: By: Virgina Norfolk M.D. On: 08/11/2021 00:33     A/P: 88 year old man with history of subtotal colectomy and chronic intermittent abdominal pain presents with symptoms similar to previous episodes.  I have reviewed his CT scan personally, there is concern for pneumoperitoneum surrounding loops of small bowel.  His remnant sigmoid colon and distal small bowel do appear dilated, but I have a difficult time visualizing the pneumoperitoneum reported.  At this time the patient's pain has resolved  and he is completely nontender on exam.  His vital signs are normal and his labs are unremarkable, he does have chronic kidney disease and his creatinine is near what seems to be his baseline.  He is a very high risk candidate for exploratory surgery and given his overall clinical picture I do not think it is warranted at this time.  Agree with plans for medical admission, surgery team will follow closely with serial abdominal exams/labs.  We will repeat a plain film in the morning.    Patient Active Problem List   Diagnosis Date Noted   Perforated bowel ([place]) 08/11/2021   Vitamin B12 deficiency 04/15/2019   Abnormal glucose 04/15/2019   Stage 4 chronic kidney disease ([place]) 04/15/2019   Dyspnea on exertion 04/15/2019   Pulmonary hypertension ([place]) 04/08/2019   Chest pain 03/27/2019   Complete heart block ([facility]) 04/17/2018   Syncope and collapse 04/16/2018   Sinus pause 04/16/2018   Snoring 10/03/2017   Other fatigue 10/03/2017   Pre-syncope 05/14/2016   Murmur 05/14/2016   CAD in native artery 10/31/2015   History of MI (myocardial infarction) 10/31/2015   Preoperative cardiovascular examination 12/26/2014   History of colonic polyps 01/17/2014   PAF (paroxysmal atrial fibrillation) ([facility]) 00/51/1021   Metabolic acidosis 11/73/5670   Acute renal failure ([place]) 08/13/2013   Stage 3b chronic kidney disease  ([place]) 08/13/2013   Anemia in chronic kidney disease 08/13/2013   Hypokalemia 08/12/2013   Acute blood loss anemia 08/09/2013   Encephalopathy 08/09/2013   Severe sepsis ([facility]) 08/08/2013   Hypotension, unspecified 08/07/2013   Complex renal cyst 08/07/2013   Renal mass, left 08/06/2013   Upper GI bleeding 08/05/2013   Acute esophagitis 08/05/2013   Urinary retention 08/04/2013   S/P total hip arthroplasty 08/03/2013   Degenerative joint disease (DJD) of hip 08/03/2013    Class: Chronic   Internal hemorrhoids with other complication 14/11/3011   Tinnitus 10/22/2012   C. difficile colitis 07/26/2012   Ileus, postoperative ([place]) 07/24/2012   Somnolence 07/24/2012   Anxiety state 07/24/2012   Chest pain- ER 08/26/12 03/30/2011   Fever 03/28/2011   Leucocytosis 03/28/2011   CKD (chronic kidney disease), stage III ([place]) 03/28/2011   Hyponatremia 02/02/2011   BPH (benign prostatic hyperplasia) 02/01/2011   Cholecystitis chronic, acute 01/26/2011   Dyslipidemia 01/22/2011   Abdominal bloating 08/07/2010   Loose stools 08/07/2010   Coronary atherosclerosis 10/26/2009   Essential hypertension 06/07/2009   DIVERTICULOSIS OF COLON 10/31/2008   DIVERTICULOSIS, COLON, WITH HEMORRHAGE, surg 12/11  10/31/2008   Personal history of colonic polyps 10/31/2008       Romana Juniper, [facility]  See AMION to contact appropriate on-call provider

## 2021-08-11 NOTE — Progress Notes (Signed)
[facility] Progress Note     Subjective: CC:  Wife and daughter at bedside.  Patient denies abdominal pain, nausea, or vomiting. Has had flatus and BM x2 since arrival to ED. Per RN patient mobilized to the bathroom with his walker.  Objective: Vital signs in last 24 hours: Pulse Rate:  [59-90] 59 (07/01 1100) Resp:  [13-24] 15 (07/01 1100) BP: (128-171)/(64-89) 149/70 (07/01 1100) SpO2:  [93 %-98 %] 96 % (07/01 1100) Weight:  [92.5 kg] 92.5 kg (06/30 2325)    Intake/Output from previous day: No intake/output data recorded. Intake/Output this shift: No intake/output data recorded.  PE: Gen:  Alert, NAD, pleasant Card:  Regular rate and rhythm, HR 72 bpm on monitor Pulm:  Normal effort Abd: Soft, mild distention, non-tender in all 4 quadrants without guarding, previous laparotomy and R subcostal incision appear well healing. Skin: warm and dry, no rashes  Psych: A&Ox3   Lab Results:  Recent Labs    08/10/21 2340  WBC 5.6  HGB 12.9*  HCT 38.1*  PLT 138*   BMET Recent Labs    08/10/21 2340  NA 140  K 4.1  CL 101  CO2 27  GLUCOSE 222*  BUN 24*  CREATININE 1.58*  CALCIUM 9.3   PT/INR No results for input(s): "LABPROT", "INR" in the last 72 hours. CMP     Component Value Date/Time   NA 140 08/10/2021 2340   NA 138 11/18/2019 1214   K 4.1 08/10/2021 2340   CL 101 08/10/2021 2340   CO2 27 08/10/2021 2340   GLUCOSE 222 (H) 08/10/2021 2340   BUN 24 (H) 08/10/2021 2340   BUN 23 11/18/2019 1214   CREATININE 1.58 (H) 08/10/2021 2340   CREATININE 1.63 (H) 03/04/2011 1537   CALCIUM 9.3 08/10/2021 2340   PROT 6.1 (L) 08/10/2021 2340   PROT 6.5 11/03/2019 1504   ALBUMIN 3.8 08/10/2021 2340   ALBUMIN 4.5 11/03/2019 1504   AST 19 08/10/2021 2340   ALT 14 08/10/2021 2340   ALKPHOS 30 (L) 08/10/2021 2340   BILITOT 0.8 08/10/2021 2340   BILITOT 0.4 11/03/2019 1504   GFRNONAA 42 (L) 08/10/2021 2340   GFRAA 47 (L) 11/18/2019 1214   Lipase      Component Value Date/Time   LIPASE 39 08/10/2021 2340       Studies/Results: DG ABD ACUTE 2+V W 1V CHEST  Result Date: 08/11/2021 CLINICAL DATA:  Abdominal pain, free air on recent CT examination EXAM: DG ABDOMEN ACUTE WITH 1 VIEW CHEST COMPARISON:  CT from earlier in the same day. FINDINGS: Scattered large and small bowel gas is noted. Upright film and decubitus views show some mild lucency suggestive of free air. This is less prominent than that seen on recent CT examination. Postsurgical changes in the lumbosacral spine are noted. Cardiac shadow is enlarged. Aortic calcifications are noted. Lungs are well aerated without focal infiltrate. IMPRESSION: Minimal free air somewhat less than that seen on recent CT examination. Electronically Signed   By: Inez Catalina M.D.   On: 08/11/2021 03:06   CT Renal Stone Study  Addendum Date: 08/11/2021   ADDENDUM REPORT: 08/11/2021 00:50 ADDENDUM: Results were discussed with Dr. Randal Buba at 12:50 a.m. [place] on August 11, 2021. Electronically Signed   By: Virgina Norfolk M.D.   On: 08/11/2021 00:50   Result Date: 08/11/2021 CLINICAL DATA:  Abdominal pain. EXAM: CT ABDOMEN AND PELVIS WITHOUT CONTRAST TECHNIQUE: Multidetector CT imaging of the abdomen and pelvis was performed following the standard protocol  without IV contrast. RADIATION DOSE REDUCTION: This exam was performed according to the departmental dose-optimization program which includes automated exposure control, adjustment of the mA and/or kV according to patient size and/or use of iterative reconstruction technique. COMPARISON:  June 02, 2015 FINDINGS: Lower chest: No acute abnormality. Hepatobiliary: No focal liver abnormality is seen. Status post cholecystectomy. No biliary dilatation. Pancreas: Unremarkable. No pancreatic ductal dilatation or surrounding inflammatory changes. Spleen: Normal in size without focal abnormality. Adrenals/Urinary Tract: Adrenal glands are unremarkable. Kidneys are normal  in size, without obstructing renal calculi, focal lesion, or hydronephrosis. 2 mm and 3 mm nonobstructing renal calculi are seen within the left kidney. Bladder is unremarkable. Stomach/Bowel: Stomach is within normal limits. Surgically anastomosed bowel is seen along the expected region of the mid sigmoid colon. Dilated, air-filled loops of ileum are seen extending to the level of the anastomosis (maximum small bowel diameter of approximately 5.9 cm). While a transition zone is not present a moderate to large amount of stool is seen within the distal sigmoid colon and rectum. A mild to moderate amount of free air is seen adjacent to these bowel loops (axial CT images 21 through 52, CT series 3). Vascular/Lymphatic: Aortic atherosclerosis. No enlarged abdominal or pelvic lymph nodes. Reproductive: Moderate severity calcification is seen within the mildly enlarged prostate gland. Other: No abdominal wall hernia or abnormality. No abdominopelvic ascites. Musculoskeletal: There is a total right hip replacement with associated streak artifact and subsequently limited evaluation of the adjacent osseous and soft tissue structures. Stable, extensive postoperative changes are seen within the mid and lower thoracic spine. IMPRESSION: 1. Mild to moderate amount of peri-intestinal free air, as described above, consistent with bowel perforation. 2. Evidence of prior total colectomy with dilated small bowel loops seen proximal to the site of surgical anastomosis. While this may be, in part, postoperative in origin, a component of obstructed small bowel cannot be excluded. 3. Evidence of prior cholecystectomy. 4. Multiple nonobstructing left renal calculi. 5. Aortic atherosclerosis. 6. Total right hip replacement. 7. Stable postoperative changes within the mid and lower thoracic spine. Aortic Atherosclerosis (ICD10-I70.0). Electronically Signed: By: Virgina Norfolk M.D. On: 08/11/2021 00:33     Anti-infectives: Anti-infectives (From admission, onward)    Start     Dose/Rate Route Frequency Ordered Stop   08/11/21 0800  piperacillin-tazobactam (ZOSYN) IVPB 3.375 g        3.375 g 12.5 mL/hr over 240 Minutes Intravenous Every 8 hours 08/11/21 0539     08/11/21 0045  piperacillin-tazobactam (ZOSYN) IVPB 3.375 g        3.375 g 100 mL/hr over 30 Minutes Intravenous  Once 08/11/21 0037 08/11/21 0222        Assessment/Plan  88 y/o M with MMP and complex surgical history who presented with acute on chronic epigastric pain. CT with possible pneumoperitoneum near loops of small bowel.   Pt seen by Dr. Windle Guard early this AM. I checked on him later this morning and he has no abdominal pain. His vitals are normal and he is hemodynamically stable. His labs have not yet been repeated. Abdominal x-ray with interval decrease in free air compared to CT. I do not see any emergent indication for surgery. I recommend continued NPO/bowel rest and IV Zosyn. We will follow.   FEN: NPO. IVF per primary ID: Zosyn VTE: SCD's, ok for chemical VTE ppx from CCS perspective Foley: none   LOS: 0 days   I reviewed nursing notes, hospitalist notes, last 24 h vitals and pain  scores, last 48 h intake and output, last 24 h labs and trends, and last 24 h imaging results.    Obie Dredge, PA-C [facility] Please see Amion for pager number during day hours 7:00am-4:30pm

## 2021-08-11 NOTE — ED Notes (Signed)
Pt back to xray  wife at  the bedside

## 2021-08-11 NOTE — ED Notes (Signed)
Hospitalist at bedside 

## 2021-08-11 NOTE — ED Notes (Signed)
Pt asked this Probation officer to inform the Hospitalist that he "is going home today."  Secure chat sent to MD.

## 2021-08-11 NOTE — IPAL (Signed)
  Interdisciplinary Goals of Care Family Meeting   Date carried out: 08/11/2021  Location of the meeting: Bedside  Member's involved: Physician and Family Member or next of kin  Durable Power of Attorney or Loss adjuster, chartered: NA    Discussion: We discussed goals of care for [employer] .  Patient's wife was at his bedside. Patient presented to the hospital with abdominal pain and was found to have pneumoperitoneum.  General surgery recommending conservative management due to lack of significant symptoms as well as high surgical risk. Patient tells me that he wants to go home on 7/1 regardless of the outcome.  He is a if I die I die. Discussed in detail with regards to patient's current presentation and future prognosis with patient and wife. Ask whether they have any discussion about advance goals of care in the past. Per patient's wife states that they have decided that he does not want to be on life support. Explained in detail what that would mean. Patient would not like to be brought back in an event of cardiac arrest or respiratory arrest. Patient's CODE STATUS changed to limited code-NIV only.  No CPR and no intubation. After which discussed in detail what risk patient will take by going home [place] and possibility of death should he develops severe peritonitis/sepsis. Recommended that if he wants to go home regardless of the outcome he may benefit from going home on hospice so that his symptoms are at least managed.  After this discussion patient's and wife currently agreeable to stay in the hospital.  Code status: Full DNR  Disposition: Continue current acute care  Time spent for the meeting: 35 minutes  8:40 AM to 9:15 AM.  Berle Mull, MD  08/11/2021, 10:11 AM

## 2021-08-12 DIAGNOSIS — K5900 Constipation, unspecified: Secondary | ICD-10-CM | POA: Diagnosis present

## 2021-08-12 DIAGNOSIS — K631 Perforation of intestine (nontraumatic): Secondary | ICD-10-CM | POA: Diagnosis not present

## 2021-08-12 LAB — BASIC METABOLIC PANEL
Anion gap: 12 (ref 5–15)
BUN: 18 mg/dL (ref 8–23)
CO2: 24 mmol/L (ref 22–32)
Calcium: 9.3 mg/dL (ref 8.9–10.3)
Chloride: 105 mmol/L (ref 98–111)
Creatinine, Ser: 1.64 mg/dL — ABNORMAL HIGH (ref 0.61–1.24)
GFR, Estimated: 40 mL/min — ABNORMAL LOW (ref >60)
Glucose, Bld: 168 mg/dL — ABNORMAL HIGH (ref 70–99)
Potassium: 4.1 mmol/L (ref 3.5–5.1)
Sodium: 141 mmol/L (ref 135–145)

## 2021-08-12 LAB — HEPATIC FUNCTION PANEL
ALT: 15 U/L (ref 0–44)
AST: 24 U/L (ref 15–41)
Albumin: 3.9 g/dL (ref 3.5–5.0)
Alkaline Phosphatase: 28 U/L — ABNORMAL LOW (ref 38–126)
Bilirubin, Direct: 0.2 mg/dL (ref 0.0–0.2)
Indirect Bilirubin: 1.1 mg/dL — ABNORMAL HIGH (ref 0.3–0.9)
Total Bilirubin: 1.3 mg/dL — ABNORMAL HIGH (ref 0.3–1.2)
Total Protein: 6.4 g/dL — ABNORMAL LOW (ref 6.5–8.1)

## 2021-08-12 LAB — CBC
HCT: 41.6 % (ref 39.0–52.0)
Hemoglobin: 14.1 g/dL (ref 13.0–17.0)
MCH: 30.3 pg (ref 26.0–34.0)
MCHC: 33.9 g/dL (ref 30.0–36.0)
MCV: 89.3 fL (ref 80.0–100.0)
Platelets: 165 10*3/uL (ref 150–400)
RBC: 4.66 MIL/uL (ref 4.22–5.81)
RDW: 12.9 % (ref 11.5–15.5)
WBC: 7.3 10*3/uL (ref 4.0–10.5)
nRBC: 0 % (ref 0.0–0.2)

## 2021-08-12 MED ORDER — PANTOPRAZOLE SODIUM 40 MG IV SOLR
40.0000 mg | Freq: Every day | INTRAVENOUS | Status: DC
  Administered 2021-08-12 – 2021-08-14 (×3): 40 mg via INTRAVENOUS
  Filled 2021-08-12 (×3): qty 10

## 2021-08-12 MED ORDER — PREGABALIN 75 MG PO CAPS
75.0000 mg | ORAL_CAPSULE | Freq: Every day | ORAL | Status: DC
  Administered 2021-08-12 – 2021-08-14 (×3): 75 mg via ORAL
  Filled 2021-08-12 (×3): qty 1

## 2021-08-12 MED ORDER — ONDANSETRON HCL 4 MG/2ML IJ SOLN: 4.0000 mg | Freq: Four times a day (QID) | INTRAMUSCULAR | Status: DC | PRN

## 2021-08-12 MED ORDER — FLUTICASONE PROPIONATE 50 MCG/ACT NA SUSP
2.0000 | Freq: Every day | NASAL | Status: DC
  Administered 2021-08-12 – 2021-08-14 (×3): 2 via NASAL
  Filled 2021-08-12: qty 16

## 2021-08-12 MED ORDER — FENTANYL CITRATE PF 50 MCG/ML IJ SOSY
12.5000 ug | PREFILLED_SYRINGE | INTRAMUSCULAR | Status: DC | PRN
  Administered 2021-08-13: 25 ug via INTRAVENOUS
  Filled 2021-08-12: qty 1

## 2021-08-12 MED ORDER — PREGABALIN 75 MG PO CAPS: 75.0000 mg | ORAL_CAPSULE | Freq: Three times a day (TID) | ORAL | Status: DC

## 2021-08-12 MED ORDER — DOXAZOSIN MESYLATE 4 MG PO TABS
4.0000 mg | ORAL_TABLET | Freq: Every day | ORAL | Status: DC
  Administered 2021-08-12 – 2021-08-13 (×2): 4 mg via ORAL
  Filled 2021-08-12 (×3): qty 1

## 2021-08-12 MED ORDER — CITALOPRAM HYDROBROMIDE 40 MG PO TABS
40.0000 mg | ORAL_TABLET | Freq: Every day | ORAL | Status: DC
  Administered 2021-08-12 – 2021-08-14 (×3): 40 mg via ORAL
  Filled 2021-08-12 (×3): qty 1

## 2021-08-12 NOTE — Progress Notes (Signed)
  Progress Note Patient: Gary [name] TFT:[id number] DOB: 1932/03/31 DOA: 08/10/2021  DOS: the patient was seen and examined on 08/12/2021  Brief hospital course: 88 year old male with past medical history of HTN, type II DM, pacemaker implant, HFpEF, OSA on CPAP, history of colectomy present to the hospital with complaints of abdominal pain found to have pneumoperitoneum on CT scan.  General surgery consulted.  Currently being managed conservatively. Assessment and Plan: Perforated bowel ([place]) Pneumoperitoneum Constipation Presents with complaints of abdominal pain.  Undergoes CT renal stone study which shows mild to moderate amount of very intestinal free air concerning for bowel perforation. General surgery was consulted. Patient was started on IV fluids and IV antibiotics.  Remains on n.p.o. status. Repeat x-ray did not show significant pneumoperitoneum. Recommendation from the surgery was that the patient should be on conservative measures without any aggressive intervention as he is a poor surgical candidate. Continue with IV antibiotics for now.    Thrombocytopenia (HCC) Mild. At baseline platelet counts are normal. -1 drop of platelet count in the hospital.  Monitor.    Stage 3b chronic kidney disease ([place]) Renal function close to baseline. Monitor.    Anemia in chronic kidney disease Hemoglobin stable. Monitor.    Vitamin B12 deficiency Resume therapy once tolerates p.o.    Mood disorder ([place]) Continue home regimen.    Essential hypertension On Lasix and Norvasc.  Currently on hold.    Dyslipidemia Hold statin.    BPH (benign prostatic hyperplasia) We will resume Flomax.    CAD in native artery   Pulmonary hypertension (HCC) No evidence of acute exacerbation.  Currently all medications are on hold.    Chronic back pain greater than 3 months duration Continue current pain regimen.  Obesity. Body mass index is 37.01 kg/m.  Placing the pt at higher risk of  poor outcomes.  Subjective: No nausea no vomiting.  No fever no chills.  No chest pain.  Abdominal pain improving.  Physical Exam: Vitals:   08/12/21 0348 08/12/21 0354 08/12/21 0801 08/12/21 1629  BP: (!) 163/84  (!) 158/69 (!) 172/74  Pulse: 81  70 79  Resp: '18  17 18  '$ Temp: 98.4 F (36.9 C)  98.9 F (37.2 C) 98.5 F (36.9 C)  TempSrc: Oral  Oral Oral  SpO2: 97%  95% 97%  Weight:  97.8 kg    Height:       General: Appear in mild distress; no visible Abnormal Neck Mass Or lumps, Conjunctiva normal Cardiovascular: S1 and S2 Present, no Murmur, Respiratory: good respiratory effort, Bilateral Air entry present and CTA, no Crackles, no wheezes Abdomen: Bowel Sound present, Non tender  Extremities: no Pedal edema Neurology: alert and oriented to time, place, and person  Gait not checked due to patient safety concerns   Data Reviewed: I have Reviewed nursing notes, Vitals, and Lab results since pt's last encounter. Pertinent lab results CBC and BMP I have ordered test including CBC and BMP I have reviewed the last note from general surgery,    Family Communication: Daughter at bedside  Disposition: Status is: Inpatient Remains inpatient appropriate because: Monitor for improvement in oral intake.  Author: Berle Mull, MD 08/12/2021 8:41 PM  Please look on [email or web address] to find out who is on call.

## 2021-08-12 NOTE — Hospital Course (Signed)
88 year old male with past medical history of HTN, type II DM, pacemaker implant, HFpEF, OSA on CPAP, history of colectomy present to the hospital with complaints of abdominal pain found to have pneumoperitoneum on CT scan.  General surgery consulted.  Currently being managed conservatively.

## 2021-08-12 NOTE — Progress Notes (Signed)
Subjective/Chief Complaint: Patient hungry.  Abdominal pain minimal this morning.  Bowels are moving.   Objective: Vital signs in last 24 hours: Temp:  [98 F (36.7 C)-98.9 F (37.2 C)] 98.9 F (37.2 C) (07/02 0801) Pulse Rate:  [59-91] 70 (07/02 0801) Resp:  [12-20] 17 (07/02 0801) BP: (128-170)/(60-87) 158/69 (07/02 0801) SpO2:  [92 %-97 %] 95 % (07/02 0801) Weight:  [97.8 kg] 97.8 kg (07/02 0354) Last BM Date : 08/11/21  Intake/Output from previous day: 07/01 0701 - 07/02 0700 In: 50 [IV Piggyback:50] Out: -  Intake/Output this shift: No intake/output data recorded. Gen:  Alert, NAD, pleasant Card:  Regular rate and rhythm, HR 72 bpm on monitor Pulm:  Normal effort Abd: Soft, mild distention, non-tender in all 4 quadrants without guarding, previous laparotomy and R subcostal incision appear well healing. Skin: warm and dry, no rashes    Lab Results:  Recent Labs    08/10/21 2340 08/12/21 0003  WBC 5.6 7.3  HGB 12.9* 14.1  HCT 38.1* 41.6  PLT 138* 165   BMET Recent Labs    08/10/21 2340 08/12/21 0003  NA 140 141  K 4.1 4.1  CL 101 105  CO2 27 24  GLUCOSE 222* 168*  BUN 24* 18  CREATININE 1.58* 1.64*  CALCIUM 9.3 9.3   PT/INR No results for input(s): "LABPROT", "INR" in the last 72 hours. ABG No results for input(s): "PHART", "HCO3" in the last 72 hours.  Invalid input(s): "PCO2", "PO2"  Studies/Results: DG ABD ACUTE 2+V W 1V CHEST  Result Date: 08/11/2021 CLINICAL DATA:  Abdominal pain, free air on recent CT examination EXAM: DG ABDOMEN ACUTE WITH 1 VIEW CHEST COMPARISON:  CT from earlier in the same day. FINDINGS: Scattered large and small bowel gas is noted. Upright film and decubitus views show some mild lucency suggestive of free air. This is less prominent than that seen on recent CT examination. Postsurgical changes in the lumbosacral spine are noted. Cardiac shadow is enlarged. Aortic calcifications are noted. Lungs are well aerated  without focal infiltrate. IMPRESSION: Minimal free air somewhat less than that seen on recent CT examination. Electronically Signed   By: Inez Catalina M.D.   On: 08/11/2021 03:06   CT Renal Stone Study  Addendum Date: 08/11/2021   ADDENDUM REPORT: 08/11/2021 00:50 ADDENDUM: Results were discussed with Dr. Randal Buba at 12:50 a.m. [place] on August 11, 2021. Electronically Signed   By: Virgina Norfolk M.D.   On: 08/11/2021 00:50   Result Date: 08/11/2021 CLINICAL DATA:  Abdominal pain. EXAM: CT ABDOMEN AND PELVIS WITHOUT CONTRAST TECHNIQUE: Multidetector CT imaging of the abdomen and pelvis was performed following the standard protocol without IV contrast. RADIATION DOSE REDUCTION: This exam was performed according to the departmental dose-optimization program which includes automated exposure control, adjustment of the mA and/or kV according to patient size and/or use of iterative reconstruction technique. COMPARISON:  June 02, 2015 FINDINGS: Lower chest: No acute abnormality. Hepatobiliary: No focal liver abnormality is seen. Status post cholecystectomy. No biliary dilatation. Pancreas: Unremarkable. No pancreatic ductal dilatation or surrounding inflammatory changes. Spleen: Normal in size without focal abnormality. Adrenals/Urinary Tract: Adrenal glands are unremarkable. Kidneys are normal in size, without obstructing renal calculi, focal lesion, or hydronephrosis. 2 mm and 3 mm nonobstructing renal calculi are seen within the left kidney. Bladder is unremarkable. Stomach/Bowel: Stomach is within normal limits. Surgically anastomosed bowel is seen along the expected region of the mid sigmoid colon. Dilated, air-filled loops of ileum are seen extending to  the level of the anastomosis (maximum small bowel diameter of approximately 5.9 cm). While a transition zone is not present a moderate to large amount of stool is seen within the distal sigmoid colon and rectum. A mild to moderate amount of free air is seen  adjacent to these bowel loops (axial CT images 21 through 52, CT series 3). Vascular/Lymphatic: Aortic atherosclerosis. No enlarged abdominal or pelvic lymph nodes. Reproductive: Moderate severity calcification is seen within the mildly enlarged prostate gland. Other: No abdominal wall hernia or abnormality. No abdominopelvic ascites. Musculoskeletal: There is a total right hip replacement with associated streak artifact and subsequently limited evaluation of the adjacent osseous and soft tissue structures. Stable, extensive postoperative changes are seen within the mid and lower thoracic spine. IMPRESSION: 1. Mild to moderate amount of peri-intestinal free air, as described above, consistent with bowel perforation. 2. Evidence of prior total colectomy with dilated small bowel loops seen proximal to the site of surgical anastomosis. While this may be, in part, postoperative in origin, a component of obstructed small bowel cannot be excluded. 3. Evidence of prior cholecystectomy. 4. Multiple nonobstructing left renal calculi. 5. Aortic atherosclerosis. 6. Total right hip replacement. 7. Stable postoperative changes within the mid and lower thoracic spine. Aortic Atherosclerosis (ICD10-I70.0). Electronically Signed: By: Virgina Norfolk M.D. On: 08/11/2021 00:33    Anti-infectives: Anti-infectives (From admission, onward)    Start     Dose/Rate Route Frequency Ordered Stop   08/11/21 0800  piperacillin-tazobactam (ZOSYN) IVPB 3.375 g        3.375 g 12.5 mL/hr over 240 Minutes Intravenous Every 8 hours 08/11/21 0539     08/11/21 0045  piperacillin-tazobactam (ZOSYN) IVPB 3.375 g        3.375 g 100 mL/hr over 30 Minutes Intravenous  Once 08/11/21 0037 08/11/21 0222       Assessment/Plan: 88 y/o M with MMP and complex surgical history who presented with acute on chronic epigastric pain. CT with possible pneumoperitoneum near loops of small bowel.    Patiently has a benign abdomen.   Try clear  liquids today.  Continue antibiotics  FEN: IVF per primary ID: Zosyn VTE: SCD's, ok for chemical VTE ppx from CCS perspective Foley: none   Total time 20 minutes   I reviewed nursing notes, hospitalist notes, last 24 h vitals and pain scores, last 48 h intake and output, last 24 h labs and trends, and last 24 h imaging results.   LOS: 1 day    Joyice Faster Laurelai Lepp 08/12/2021

## 2021-08-13 DIAGNOSIS — K631 Perforation of intestine (nontraumatic): Secondary | ICD-10-CM | POA: Diagnosis not present

## 2021-08-13 LAB — BASIC METABOLIC PANEL
Anion gap: 7 (ref 5–15)
BUN: 21 mg/dL (ref 8–23)
CO2: 22 mmol/L (ref 22–32)
Calcium: 9 mg/dL (ref 8.9–10.3)
Chloride: 109 mmol/L (ref 98–111)
Creatinine, Ser: 1.66 mg/dL — ABNORMAL HIGH (ref 0.61–1.24)
GFR, Estimated: 40 mL/min — ABNORMAL LOW (ref >60)
Glucose, Bld: 121 mg/dL — ABNORMAL HIGH (ref 70–99)
Potassium: 3.6 mmol/L (ref 3.5–5.1)
Sodium: 138 mmol/L (ref 135–145)

## 2021-08-13 LAB — CBC
HCT: 36.6 % — ABNORMAL LOW (ref 39.0–52.0)
Hemoglobin: 12.6 g/dL — ABNORMAL LOW (ref 13.0–17.0)
MCH: 30.5 pg (ref 26.0–34.0)
MCHC: 34.4 g/dL (ref 30.0–36.0)
MCV: 88.6 fL (ref 80.0–100.0)
Platelets: 194 10*3/uL (ref 150–400)
RBC: 4.13 MIL/uL — ABNORMAL LOW (ref 4.22–5.81)
RDW: 13.2 % (ref 11.5–15.5)
WBC: 7.2 10*3/uL (ref 4.0–10.5)
nRBC: 0 % (ref 0.0–0.2)

## 2021-08-13 MED ORDER — ACETAMINOPHEN 325 MG PO TABS
650.0000 mg | ORAL_TABLET | Freq: Four times a day (QID) | ORAL | Status: DC | PRN
Start: 2021-08-13 — End: 2021-08-14
  Administered 2021-08-14: 650 mg via ORAL
  Filled 2021-08-13: qty 2

## 2021-08-13 NOTE — Progress Notes (Signed)
[facility] Progress Note     Subjective: No family at bedside today. Denies abdominal pain. Tolerating CLD. Denies n/v. Reports he is having some loose bowel function. He feels hungry  Objective: Vital signs in last 24 hours: Temp:  [97.2 F (36.2 C)-98.5 F (36.9 C)] 98.4 F (36.9 C) (07/03 0836) Pulse Rate:  [71-81] 71 (07/03 0836) Resp:  [16-18] 18 (07/03 0836) BP: (146-172)/(74-78) 148/78 (07/03 0836) SpO2:  [95 %-97 %] 97 % (07/03 0836) Weight:  [89.5 kg] 89.5 kg (07/03 0532) Last BM Date : 08/12/21  Intake/Output from previous day: 07/02 0701 - 07/03 0700 In: 370 [P.O.:370] Out: -  Intake/Output this shift: No intake/output data recorded.  PE: Gen:  Alert, NAD, pleasant Card:  Regular rate and rhythm, HR 72 bpm on monitor Pulm:  Normal effort Abd: Soft, mild distention, non-tender in all 4 quadrants without guarding, previous laparotomy and R subcostal incision appear well healing. Skin: warm and dry, no rashes  Psych: A&Ox3   Lab Results:  Recent Labs    08/12/21 0003 08/13/21 0631  WBC 7.3 7.2  HGB 14.1 12.6*  HCT 41.6 36.6*  PLT 165 194    BMET Recent Labs    08/12/21 0003 08/13/21 0830  NA 141 138  K 4.1 3.6  CL 105 109  CO2 24 22  GLUCOSE 168* 121*  BUN 18 21  CREATININE 1.64* 1.66*  CALCIUM 9.3 9.0    PT/INR No results for input(s): "LABPROT", "INR" in the last 72 hours. CMP     Component Value Date/Time   NA 138 08/13/2021 0830   NA 138 11/18/2019 1214   K 3.6 08/13/2021 0830   CL 109 08/13/2021 0830   CO2 22 08/13/2021 0830   GLUCOSE 121 (H) 08/13/2021 0830   BUN 21 08/13/2021 0830   BUN 23 11/18/2019 1214   CREATININE 1.66 (H) 08/13/2021 0830   CREATININE 1.63 (H) 03/04/2011 1537   CALCIUM 9.0 08/13/2021 0830   PROT 6.4 (L) 08/12/2021 0003   PROT 6.5 11/03/2019 1504   ALBUMIN 3.9 08/12/2021 0003   ALBUMIN 4.5 11/03/2019 1504   AST 24 08/12/2021 0003   ALT 15 08/12/2021 0003   ALKPHOS 28 (L) 08/12/2021 0003    BILITOT 1.3 (H) 08/12/2021 0003   BILITOT 0.4 11/03/2019 1504   GFRNONAA 40 (L) 08/13/2021 0830   GFRAA 47 (L) 11/18/2019 1214   Lipase     Component Value Date/Time   LIPASE 39 08/10/2021 2340       Studies/Results: No results found.  Anti-infectives: Anti-infectives (From admission, onward)    Start     Dose/Rate Route Frequency Ordered Stop   08/11/21 0800  piperacillin-tazobactam (ZOSYN) IVPB 3.375 g        3.375 g 12.5 mL/hr over 240 Minutes Intravenous Every 8 hours 08/11/21 0539     08/11/21 0045  piperacillin-tazobactam (ZOSYN) IVPB 3.375 g        3.375 g 100 mL/hr over 30 Minutes Intravenous  Once 08/11/21 0037 08/11/21 0222        Assessment/Plan  88 y/o M with MMP and complex surgical history who presented with acute on chronic epigastric pain. CT with possible pneumoperitoneum near loops of small bowel.  - no leukocytosis and HD stable - no abdominal pain on exam - tolerating CLD and having bowel function  - advance to FLD and then as tolerated to soft diet  - no indication for surgical intervention at this time - will discuss further need for  abx with attending MD - possible discharge tomorrow if tolerating diet and not having worsened abdominal pain or any concerns for perforation   FEN: FLD, ADAT to soft, SLIV ID: Zosyn 7/1>> VTE: SCD's, ok for chemical VTE ppx from CCS perspective Foley: none   LOS: 2 days   I reviewed nursing notes, hospitalist notes, last 24 h vitals and pain scores, last 48 h intake and output, and last 24 h labs and trends.    Barkley Boards, [facility] Please see Amion for pager number during day hours 7:00am-4:30pm

## 2021-08-13 NOTE — Progress Notes (Signed)
  Progress Note Patient: Gary [name] WIO:[id number] DOB: 10/08/1932 DOA: 08/10/2021  DOS: the patient was seen and examined on 08/13/2021  Brief hospital course: 88 year old male with past medical history of HTN, type II DM, pacemaker implant, HFpEF, OSA on CPAP, history of colectomy present to the hospital with complaints of abdominal pain found to have pneumoperitoneum on CT scan.  General surgery consulted.  Currently being managed conservatively. Assessment and Plan: Perforated bowel ([place]) Pneumoperitoneum Constipation Presents with complaints of abdominal pain.  Undergoes CT renal stone study which shows mild to moderate amount of very intestinal free air concerning for bowel perforation. General surgery was consulted. Patient was started on IV fluids and IV antibiotics.  Remains on n.p.o. status. Repeat x-ray did not show significant pneumoperitoneum. Recommendation from the surgery was that the patient should be on conservative measures without any aggressive intervention as he is a poor surgical candidate. Continue with IV antibiotics for now.  Diet advanced.  Switch to p.o. tomorrow.    Thrombocytopenia (HCC) Mild. At baseline platelet counts are normal. We will monitor.    Stage 3b chronic kidney disease ([place]) Renal function close to baseline. Monitor.    Anemia in chronic kidney disease Hemoglobin stable. Monitor.    Vitamin B12 deficiency Resume therapy once tolerates p.o.    Mood disorder ([place]) Continue home regimen.    Essential hypertension On Lasix and Norvasc.  Currently on hold.    Dyslipidemia Hold statin.    BPH (benign prostatic hyperplasia) We will resume Flomax.    CAD in native artery   Pulmonary hypertension (HCC) No evidence of acute exacerbation.  Currently all medications are on hold.    Chronic back pain greater than 3 months duration Continue current pain regimen.  Obesity. Body mass index is 33.88 kg/m.  Placing the pt at higher risk  of poor outcomes.  Subjective: No abdominal pain.  No nausea or vomiting.  No fever no chills.  Physical Exam: Vitals:   08/13/21 0500 08/13/21 0532 08/13/21 0836 08/13/21 1605  BP: (!) 146/77  (!) 148/78 139/82  Pulse: 81  71 66  Resp: '16  18 18  '$ Temp: (!) 97.2 F (36.2 C)  98.4 F (36.9 C) 98.1 F (36.7 C)  TempSrc: Oral  Oral Oral  SpO2: 95%  97% 100%  Weight:  89.5 kg    Height:       General: Appear in mild distress, no Rash; Oral Mucosa Clear, moist. no Abnormal Neck Mass Or lumps, Conjunctiva normal  Cardiovascular: S1 and S2 Present, no Murmur, Respiratory: good respiratory effort, Bilateral Air entry present and CTA, no Crackles, no wheezes Abdomen: Bowel Sound present, Soft and no tenderness Extremities: no Pedal edema Neurology: alert and oriented to time, place, and person affect appropriate. no new focal deficit Gait not checked due to patient safety concerns   Data Reviewed: I have Reviewed nursing notes, Vitals, and Lab results since pt's last encounter. Pertinent lab results CBC and BMP I have ordered test including CBC and BMP I have discussed pt's care plan and test results with general surgery.    Family Communication: No one at bedside  Disposition: Status is: Inpatient Remains inpatient appropriate because: Monitor for stability overnight and improvement in oral intake. Author: Berle Mull, MD 08/13/2021 7:07 PM  Please look on [email or web address] to find out who is on call.

## 2021-08-13 NOTE — Progress Notes (Signed)
Mobility Specialist Progress Note   08/13/21 1605  Therapy Vitals  Temp 98.1 F (36.7 C)  Temp Source Oral  Pulse Rate 66  Resp 18  BP 139/82  Oxygen Therapy  SpO2 100 %  O2 Device Room Air  Mobility  Activity Ambulated with assistance in hallway  Level of Assistance Minimal assist, patient does 75% or more  Assistive Device Front wheel walker  Distance Ambulated (ft) 66 ft  Activity Response Tolerated well  $Mobility charge 1 Mobility   Patient received in bed agreeable to participate in mobility. Ambulated in hall with a steady gait reporting no pain. Returned to room without complaint or incident. Was left in bed with all needs met, call bell in reach.    Holland Falling Mobility Specialist MS [facility] #:  [phone] Acute Rehab Office:  [phone]

## 2021-08-14 DIAGNOSIS — K631 Perforation of intestine (nontraumatic): Secondary | ICD-10-CM | POA: Diagnosis not present

## 2021-08-14 LAB — CBC
HCT: 39 % (ref 39.0–52.0)
Hemoglobin: 13.3 g/dL (ref 13.0–17.0)
MCH: 30.4 pg (ref 26.0–34.0)
MCHC: 34.1 g/dL (ref 30.0–36.0)
MCV: 89 fL (ref 80.0–100.0)
Platelets: 141 10*3/uL — ABNORMAL LOW (ref 150–400)
RBC: 4.38 MIL/uL (ref 4.22–5.81)
RDW: 13 % (ref 11.5–15.5)
WBC: 6.5 10*3/uL (ref 4.0–10.5)
nRBC: 0 % (ref 0.0–0.2)

## 2021-08-14 LAB — BASIC METABOLIC PANEL
Anion gap: 13 (ref 5–15)
BUN: 24 mg/dL — ABNORMAL HIGH (ref 8–23)
CO2: 20 mmol/L — ABNORMAL LOW (ref 22–32)
Calcium: 9.2 mg/dL (ref 8.9–10.3)
Chloride: 105 mmol/L (ref 98–111)
Creatinine, Ser: 1.72 mg/dL — ABNORMAL HIGH (ref 0.61–1.24)
GFR, Estimated: 38 mL/min — ABNORMAL LOW (ref >60)
Glucose, Bld: 130 mg/dL — ABNORMAL HIGH (ref 70–99)
Potassium: 3.5 mmol/L (ref 3.5–5.1)
Sodium: 138 mmol/L (ref 135–145)

## 2021-08-14 MED ORDER — FUROSEMIDE 40 MG PO TABS: 40.0000 mg | ORAL_TABLET | ORAL | Status: DC

## 2021-08-14 NOTE — Progress Notes (Signed)
   Subjective/Chief Complaint: Patient denies abdominal pain.  He is moving his bowels and tolerating his diet.   Objective: Vital signs in last 24 hours: Temp:  [97.5 F (36.4 C)-98.8 F (37.1 C)] 98.8 F (37.1 C) (07/04 0750) Pulse Rate:  [65-106] 71 (07/04 0750) Resp:  [15-18] 16 (07/04 0750) BP: (129-155)/(82-104) 155/89 (07/04 0750) SpO2:  [95 %-100 %] 98 % (07/04 0750) Last BM Date : 08/12/21  Intake/Output from previous day: No intake/output data recorded. Intake/Output this shift: No intake/output data recorded.  Abdomen: Soft nontender without rebound or guarding.  Scars noted.  Lab Results:  Recent Labs    08/13/21 0631 08/14/21 0042  WBC 7.2 6.5  HGB 12.6* 13.3  HCT 36.6* 39.0  PLT 194 141*   BMET Recent Labs    08/13/21 0830 08/14/21 0042  NA 138 138  K 3.6 3.5  CL 109 105  CO2 22 20*  GLUCOSE 121* 130*  BUN 21 24*  CREATININE 1.66* 1.72*  CALCIUM 9.0 9.2   PT/INR No results for input(s): "LABPROT", "INR" in the last 72 hours. ABG No results for input(s): "PHART", "HCO3" in the last 72 hours.  Invalid input(s): "PCO2", "PO2"  Studies/Results: No results found.  Anti-infectives: Anti-infectives (From admission, onward)    Start     Dose/Rate Route Frequency Ordered Stop   08/11/21 0800  piperacillin-tazobactam (ZOSYN) IVPB 3.375 g        3.375 g 12.5 mL/hr over 240 Minutes Intravenous Every 8 hours 08/11/21 0539     08/11/21 0045  piperacillin-tazobactam (ZOSYN) IVPB 3.375 g        3.375 g 100 mL/hr over 30 Minutes Intravenous  Once 08/11/21 0037 08/11/21 0222       Assessment/Plan: 88 y/o M with MMP and complex surgical history who presented with acute on chronic epigastric pain. CT with possible pneumoperitoneum near loops of small bowel.  - no leukocytosis and HD stable - no abdominal pain on exam - tolerating  SOFT  and having bowel function  - advance to FLD and then as tolerated to soft diet  - no indication for surgical  intervention at this time - will discuss further need for abx with attending MD - OK FOR d/c FROM SURGERY STANDPOINT can stop ABX   FEN: FLD, ADAT to soft, SLIV ID: Zosyn 7/1>> VTE: SCD's, ok for chemical VTE ppx from CCS perspective Foley: none   LOS: 3 days  I reviewed nursing notes, hospitalist notes, last 24 h vitals and pain scores, last 48 h intake and output, and last 24 h labs and trends.    TOTAL TIME 20 MIN   Joyice Faster Tangelia Sanson MD  08/14/2021

## 2021-08-14 NOTE — Progress Notes (Signed)
Explained discharge instructions including next medication administration times and follow up appointments. MD was with patient to answer any additional questions he or daughter had. All questions and instructions were resolved to patient's satisfaction. Removed patient IV. All belongings are in patient's possession. Assisted with dressing. Transported down for discharge.

## 2021-08-14 NOTE — TOC CM/SW Note (Addendum)
Patient discharged . Orders for home health. Called patient number wife Lelon Frohlich answered. Patient has home aides but not HHPT . She has no preference in home health agency. Cindie with [facility] accepted referral .

## 2021-08-15 ENCOUNTER — Telehealth: Payer: Self-pay

## 2021-08-15 NOTE — Telephone Encounter (Signed)
-----   Message from Lavonna Monarch, MD sent at 08/10/2021  5:09 PM EDT ----- 3 of 4 biopsies showed nonmelanoma skin cancer, all treated with the biopsy.

## 2021-08-15 NOTE — Telephone Encounter (Signed)
Path to patient all tx with bx.

## 2021-08-16 LAB — CULTURE, BLOOD (ROUTINE X 2)
Culture: NO GROWTH
Culture: NO GROWTH
Special Requests: ADEQUATE

## 2021-08-18 NOTE — Discharge Summary (Signed)
Physician Discharge Summary   Patient: Gary [name] MRN: [id number] DOB: 08-04-1932  Admit date:     08/10/2021  Discharge date: 08/14/2021  Discharge Physician: Berle Mull  PCP: Clovia Cuff, MD  Recommendations at discharge: Follow-up with PCP in 1 to 2 weeks.  Discharge Diagnoses: Principal Problem:   Perforated bowel ([place]) Active Problems:   Pneumoperitoneum   Constipation   Thrombocytopenia (HCC)   Stage 3b chronic kidney disease (HCC)   Anemia in chronic kidney disease   Vitamin B12 deficiency   Mood disorder (HCC)   Essential hypertension   Dyslipidemia   BPH (benign prostatic hyperplasia)   CAD in native artery   Pulmonary hypertension (HCC)   Chronic back pain greater than 3 months duration  Hospital Course: 88 year old male with past medical history of HTN, type II DM, pacemaker implant, HFpEF, OSA on CPAP, history of colectomy present to the hospital with complaints of abdominal pain found to have pneumoperitoneum on CT scan.  General surgery consulted.  Managed conservatively.  Assessment and Plan: Perforated bowel ([place]) Pneumoperitoneum Constipation Presents with complaints of abdominal pain.  Undergoes CT renal stone study which shows mild to moderate amount of very intestinal free air concerning for bowel perforation. General surgery was consulted. Patient was started on IV fluids and IV antibiotics.  Remains on n.p.o. status. Repeat x-ray did not show significant pneumoperitoneum. Recommendation from the surgery was that the patient should be on conservative measures without any aggressive intervention as he is a poor surgical candidate. Tolerating oral diet.  Per surgery does not require any antibiotic therapy.     Thrombocytopenia (HCC) Mild. At baseline platelet counts are normal. We will monitor.     Stage 3b chronic kidney disease ([place]) Renal function close to baseline. Monitor.     Anemia in chronic kidney disease Hemoglobin  stable. Monitor.     Vitamin B12 deficiency Resume therapy     Mood disorder ([place]) Continue home regimen.     Essential hypertension On Lasix and Norvasc.  Were on hold.  Resume on discharge     Dyslipidemia Hold statin.     BPH (benign prostatic hyperplasia) We will resume Flomax.     CAD in native artery   Pulmonary hypertension (HCC) No evidence of acute exacerbation.  Currently all medications are on hold.     Chronic back pain greater than 3 months duration Continue current pain regimen.   Obesity. Body mass index is 33.88 kg/m.  Placing the pt at higher risk of poor outcomes.  Consultants: General surgery Procedures performed:  none DISCHARGE MEDICATION: Allergies as of 08/14/2021       Reactions   Naproxen Rash   Clopidogrel Bisulfate Other (See Comments)   H/O diverticulitis; prone to rectal bleeding.  Plavix complicated this.  jkl   Sulfonamide Derivatives Hives   Other Other (See Comments)   Narcotics-C. Diff   Latex Rash   Tape Itching, Rash   Latex-Adhesive tape        Medication List     TAKE these medications    acetaminophen 500 MG tablet Commonly known as: TYLENOL Take 1,000 mg by mouth every 8 (eight) hours as needed for headache (pain).   amLODipine 5 MG tablet Commonly known as: NORVASC TAKE 1 TABLET BY MOUTH EVERY DAY What changed: when to take this   aspirin EC 81 MG tablet Take 81 mg by mouth every morning.   blood glucose meter kit and supplies Kit Dispense based on patient and insurance preference.  Use up to four times daily as directed. (FOR ICD-9 250.00, 250.01).   carvedilol 3.125 MG tablet Commonly known as: COREG TAKE 1 TABLET (3.125 MG TOTAL) BY MOUTH 2 (TWO) TIMES DAILY WITH A MEAL. What changed: when to take this   citalopram 40 MG tablet Commonly known as: CELEXA Take 1 tablet (40 mg total) by mouth daily. What changed: when to take this   cyanocobalamin 1000 MCG tablet Take 1 tablet (1,000 mcg total) by  mouth daily. What changed: when to take this   doxazosin 4 MG tablet Commonly known as: CARDURA Take 4 mg by mouth at bedtime.   fluticasone 50 MCG/ACT nasal spray Commonly known as: FLONASE Place 2 sprays into both nostrils daily.   furosemide 40 MG tablet Commonly known as: LASIX Take 1 tablet (40 mg total) by mouth every Monday, Wednesday, and Friday.   nitroGLYCERIN 0.4 MG SL tablet Commonly known as: NITROSTAT PLACE 1 TABLET UNDER THE TONGUE EVERY 5 MINUTES AS NEEDED FOR CHEST PAIN FOR 3 DOSES   pregabalin 75 MG capsule Commonly known as: Lyrica Take 1 capsule (75 mg total) by mouth 3 (three) times daily.   simvastatin 40 MG tablet Commonly known as: ZOCOR TAKE 1 TABLET BY MOUTH AT BEDTIME   traMADol 50 MG tablet Commonly known as: ULTRAM Take 50 mg by mouth 3 (three) times daily.        Disposition: Home Diet recommendation: Soft diet  Discharge Exam: Vitals:   08/13/21 1605 08/13/21 2059 08/14/21 0434 08/14/21 0750  BP: 139/82 (!) 149/84 (!) 129/104 (!) 155/89  Pulse: 66 65 (!) 106 71  Resp: $Remo'18 15 18 16  'JVzUc$ Temp: 98.1 F (36.7 C) 97.8 F (36.6 C) (!) 97.5 F (36.4 C) 98.8 F (37.1 C)  TempSrc: Oral Oral Oral Oral  SpO2: 100% 95%  98%  Weight:      Height:       General: Appear in no distress; no visible Abnormal Neck Mass Or lumps, Conjunctiva normal Cardiovascular: S1 and S2 Present, no Murmur, Respiratory: good respiratory effort, Bilateral Air entry present and CTA, no Crackles, no wheezes Abdomen: Bowel Sound present, Non tender  Extremities: trace Pedal edema Neurology: alert and oriented to time, place, and person  Gait not checked due to patient safety concerns Filed Weights   08/11/21 2357 08/12/21 0354 08/13/21 0532  Weight: 97.8 kg 97.8 kg 89.5 kg   Condition at discharge: stable  The results of significant diagnostics from this hospitalization (including imaging, microbiology, ancillary and laboratory) are listed below for reference.    Imaging Studies: DG ABD ACUTE 2+V W 1V CHEST  Result Date: 08/11/2021 CLINICAL DATA:  Abdominal pain, free air on recent CT examination EXAM: DG ABDOMEN ACUTE WITH 1 VIEW CHEST COMPARISON:  CT from earlier in the same day. FINDINGS: Scattered large and small bowel gas is noted. Upright film and decubitus views show some mild lucency suggestive of free air. This is less prominent than that seen on recent CT examination. Postsurgical changes in the lumbosacral spine are noted. Cardiac shadow is enlarged. Aortic calcifications are noted. Lungs are well aerated without focal infiltrate. IMPRESSION: Minimal free air somewhat less than that seen on recent CT examination. Electronically Signed   By: Inez Catalina M.D.   On: 08/11/2021 03:06   CT Renal Stone Study  Addendum Date: 08/11/2021   ADDENDUM REPORT: 08/11/2021 00:50 ADDENDUM: Results were discussed with Dr. Randal Buba at 12:50 a.m. [place] on August 11, 2021. Electronically Signed   By: Hoover Browns  Houston M.D.   On: 08/11/2021 00:50   Result Date: 08/11/2021 CLINICAL DATA:  Abdominal pain. EXAM: CT ABDOMEN AND PELVIS WITHOUT CONTRAST TECHNIQUE: Multidetector CT imaging of the abdomen and pelvis was performed following the standard protocol without IV contrast. RADIATION DOSE REDUCTION: This exam was performed according to the departmental dose-optimization program which includes automated exposure control, adjustment of the mA and/or kV according to patient size and/or use of iterative reconstruction technique. COMPARISON:  June 02, 2015 FINDINGS: Lower chest: No acute abnormality. Hepatobiliary: No focal liver abnormality is seen. Status post cholecystectomy. No biliary dilatation. Pancreas: Unremarkable. No pancreatic ductal dilatation or surrounding inflammatory changes. Spleen: Normal in size without focal abnormality. Adrenals/Urinary Tract: Adrenal glands are unremarkable. Kidneys are normal in size, without obstructing renal calculi, focal lesion, or  hydronephrosis. 2 mm and 3 mm nonobstructing renal calculi are seen within the left kidney. Bladder is unremarkable. Stomach/Bowel: Stomach is within normal limits. Surgically anastomosed bowel is seen along the expected region of the mid sigmoid colon. Dilated, air-filled loops of ileum are seen extending to the level of the anastomosis (maximum small bowel diameter of approximately 5.9 cm). While a transition zone is not present a moderate to large amount of stool is seen within the distal sigmoid colon and rectum. A mild to moderate amount of free air is seen adjacent to these bowel loops (axial CT images 21 through 52, CT series 3). Vascular/Lymphatic: Aortic atherosclerosis. No enlarged abdominal or pelvic lymph nodes. Reproductive: Moderate severity calcification is seen within the mildly enlarged prostate gland. Other: No abdominal wall hernia or abnormality. No abdominopelvic ascites. Musculoskeletal: There is a total right hip replacement with associated streak artifact and subsequently limited evaluation of the adjacent osseous and soft tissue structures. Stable, extensive postoperative changes are seen within the mid and lower thoracic spine. IMPRESSION: 1. Mild to moderate amount of peri-intestinal free air, as described above, consistent with bowel perforation. 2. Evidence of prior total colectomy with dilated small bowel loops seen proximal to the site of surgical anastomosis. While this may be, in part, postoperative in origin, a component of obstructed small bowel cannot be excluded. 3. Evidence of prior cholecystectomy. 4. Multiple nonobstructing left renal calculi. 5. Aortic atherosclerosis. 6. Total right hip replacement. 7. Stable postoperative changes within the mid and lower thoracic spine. Aortic Atherosclerosis (ICD10-I70.0). Electronically Signed: By: Virgina Norfolk M.D. On: 08/11/2021 00:33   CT ANGIO CHEST AORTA W/CM & OR WO/CM  Result Date: 08/01/2021 CLINICAL DATA:  Thoracic  aortic aneurysm. EXAM: CT ANGIOGRAPHY CHEST WITH CONTRAST TECHNIQUE: Multidetector CT imaging of the chest was performed using the standard protocol during bolus administration of intravenous contrast. Multiplanar CT image reconstructions and MIPs were obtained to evaluate the vascular anatomy. RADIATION DOSE REDUCTION: This exam was performed according to the departmental dose-optimization program which includes automated exposure control, adjustment of the mA and/or kV according to patient size and/or use of iterative reconstruction technique. CONTRAST:  17mL OMNIPAQUE IOHEXOL 350 MG/ML SOLN COMPARISON:  None Available. FINDINGS: Cardiovascular: 4.0 cm ascending thoracic aortic aneurysm is noted. Atherosclerosis of thoracic aorta is noted without dissection. Mild cardiomegaly is noted. No pericardial effusion is noted. Mediastinum/Nodes: No enlarged mediastinal, hilar, or axillary lymph nodes. Thyroid gland, trachea, and esophagus demonstrate no significant findings. Lungs/Pleura: No pneumothorax or pleural effusion is noted. Mild right posterior basilar subsegmental atelectasis is noted secondary to elevated right hemidiaphragm. Upper Abdomen: No acute abnormality. Musculoskeletal: No chest wall abnormality. No acute or significant osseous findings. Review of the MIP  images confirms the above findings. IMPRESSION: 4.0 cm ascending thoracic aortic aneurysm. Recommend annual imaging followup by CTA or MRA. This recommendation follows 2010 ACCF/AHA/AATS/ACR/ASA/SCA/SCAI/SIR/STS/SVM Guidelines for the Diagnosis and Management of Patients with Thoracic Aortic Disease. Circulation. 2010; 121: M226-J335. Aortic aneurysm NOS (ICD10-I71.9). Aortic Atherosclerosis (ICD10-I70.0). Electronically Signed   By: Marijo Conception M.D.   On: 08/01/2021 16:22   DG Chest 2 View  Result Date: 07/20/2021 CLINICAL DATA:  Left-sided chest pain EXAM: CHEST - 2 VIEW COMPARISON:  08/15/2020 FINDINGS: Unchanged position of cardiac device  and leads. Stable cardiomegaly. Low lung volumes with unchanged elevation of the right hemidiaphragm. Bibasilar atelectasis. No pleural effusion or pneumothorax. No acute osseous abnormality. IMPRESSION: Stable cardiomegaly and low lung volumes. Electronically Signed   By: Merilyn Baba M.D.   On: 07/20/2021 12:49    Microbiology: Results for orders placed or performed during the hospital encounter of 08/10/21  Blood culture (routine x 2)     Status: None   Collection Time: 08/11/21  1:30 AM   Specimen: BLOOD LEFT HAND  Result Value Ref Range Status   Specimen Description BLOOD LEFT HAND  Final   Special Requests   Final    BOTTLES DRAWN AEROBIC AND ANAEROBIC Blood Culture results may not be optimal due to an excessive volume of blood received in culture bottles   Culture   Final    NO GROWTH 5 DAYS Performed at [facility], [place] [address] [place], [place] [address]    Report Status 08/16/2021 FINAL  Final  Blood culture (routine x 2)     Status: None   Collection Time: 08/11/21  4:27 PM   Specimen: BLOOD  Result Value Ref Range Status   Specimen Description BLOOD RIGHT ANTECUBITAL  Final   Special Requests   Final    BOTTLES DRAWN AEROBIC AND ANAEROBIC Blood Culture adequate volume   Culture   Final    NO GROWTH 5 DAYS Performed at [facility], [place] [address] [place], [place] [address]    Report Status 08/16/2021 FINAL  Final    Labs: CBC: Recent Labs  Lab 08/12/21 0003 08/13/21 0631 08/14/21 0042  WBC 7.3 7.2 6.5  HGB 14.1 12.6* 13.3  HCT 41.6 36.6* 39.0  MCV 89.3 88.6 89.0  PLT 165 194 734*   Basic Metabolic Panel: Recent Labs  Lab 08/12/21 0003 08/13/21 0830 08/14/21 0042  NA 141 138 138  K 4.1 3.6 3.5  CL 105 109 105  CO2 24 22 20*  GLUCOSE 168* 121* 130*  BUN 18 21 24*  CREATININE 1.64* 1.66* 1.72*  CALCIUM 9.3 9.0 9.2   Liver Function Tests: Recent Labs  Lab 08/12/21 0003  AST 24  ALT 15  ALKPHOS 28*  BILITOT 1.3*  PROT 6.4*   ALBUMIN 3.9   CBG: No results for input(s): "GLUCAP" in the last 168 hours.  Discharge time spent: greater than 30 minutes.  Signed: Berle Mull, MD [facility] 08/14/2021

## 2021-09-06 ENCOUNTER — Ambulatory Visit: Payer: Medicare PPO | Admitting: Podiatry

## 2021-09-08 ENCOUNTER — Encounter: Payer: Self-pay | Admitting: Dermatology

## 2021-09-08 NOTE — Progress Notes (Signed)
Follow-Up Visit   Subjective  Gary [name] is a 88 y.o. male who presents for the following: Follow-up (Here for skin check. Concerns right side cheek. X 6 weeks. History of non mole skin cancer. ).  Several new symptomatic crusts Location:  Duration:  Quality:  Associated Signs/Symptoms: Modifying Factors:  Severity:  Timing: Context:   Objective  Well appearing patient in no apparent distress; mood and affect are within normal limits. Right Buccal Cheek 7 mm heaped up verrucous pink crust, possible superficial carcinoma       Mid Parietal Scalp 11 mm waxy crust with focal erosion, superficial carcinoma       Right Frontal Scalp 8 Millimeter flesh-colored crust, superficial carcinoma       Right Proximal 3rd Finger 7 mm hyperkeratotic crust, superficial carcinoma         A focused examination was performed including head, neck, arms, hands. Relevant physical exam findings are noted in the Assessment and Plan.   Assessment & Plan    Neoplasm of uncertain behavior of skin Right Buccal Cheek  Skin / nail biopsy Type of biopsy: tangential   Informed consent: discussed and consent obtained   Timeout: patient name, date of birth, surgical site, and procedure verified   Anesthesia: the lesion was anesthetized in a standard fashion   Anesthetic:  1% lidocaine w/ epinephrine 1-100,000 local infiltration Instrument used: flexible razor blade   Hemostasis achieved with: ferric subsulfate   Outcome: patient tolerated procedure well   Post-procedure details: wound care instructions given    Destruction of lesion Complexity: simple   Destruction method: electrodesiccation and curettage   Informed consent: discussed and consent obtained   Timeout:  patient name, date of birth, surgical site, and procedure verified Anesthesia: the lesion was anesthetized in a standard fashion   Anesthetic:  1% lidocaine w/ epinephrine 1-100,000 local  infiltration Curettage performed in three different directions: Yes   Curettage cycles:  3 Lesion length (cm):  1.5 Lesion width (cm):  1.5 Margin per side (cm):  0.1 Final wound size (cm):  1.7 Hemostasis achieved with:  aluminum chloride Outcome: patient tolerated procedure well with no complications   Post-procedure details: wound care instructions given    Specimen 1 - Surgical pathology Differential Diagnosis: scc vs bcc txpbx Check Margins: No  After shave biopsy the base was treated with curettage plus cautery  Squamous cell carcinoma of skin of scalp and neck Mid Parietal Scalp  Skin / nail biopsy Type of biopsy: tangential   Informed consent: discussed and consent obtained   Timeout: patient name, date of birth, surgical site, and procedure verified   Anesthesia: the lesion was anesthetized in a standard fashion   Anesthetic:  1% lidocaine w/ epinephrine 1-100,000 local infiltration Instrument used: flexible razor blade   Hemostasis achieved with: ferric subsulfate   Outcome: patient tolerated procedure well   Post-procedure details: wound care instructions given    Destruction of lesion Complexity: simple   Destruction method: electrodesiccation and curettage   Informed consent: discussed and consent obtained   Timeout:  patient name, date of birth, surgical site, and procedure verified Anesthesia: the lesion was anesthetized in a standard fashion   Anesthetic:  1% lidocaine w/ epinephrine 1-100,000 local infiltration Curettage performed in three different directions: Yes   Curettage cycles:  3 Lesion length (cm):  1.5 Lesion width (cm):  1.5 Margin per side (cm):  0.1 Final wound size (cm):  1.7 Hemostasis achieved with:  aluminum chloride Outcome: patient  tolerated procedure well with no complications   Post-procedure details: wound care instructions given    Specimen 3 - Surgical pathology Differential Diagnosis: scc vs bcc txpbx Check Margins:  No  After shave biopsy the base was treated with curettage plus cautery  Carcinoma in situ of skin of scalp and neck Right Frontal Scalp  Skin / nail biopsy Type of biopsy: tangential   Informed consent: discussed and consent obtained   Timeout: patient name, date of birth, surgical site, and procedure verified   Anesthesia: the lesion was anesthetized in a standard fashion   Anesthetic:  1% lidocaine w/ epinephrine 1-100,000 local infiltration Instrument used: flexible razor blade   Hemostasis achieved with: ferric subsulfate   Outcome: patient tolerated procedure well   Post-procedure details: wound care instructions given    Destruction of lesion Complexity: simple   Destruction method: electrodesiccation and curettage   Informed consent: discussed and consent obtained   Timeout:  patient name, date of birth, surgical site, and procedure verified Anesthesia: the lesion was anesthetized in a standard fashion   Anesthetic:  1% lidocaine w/ epinephrine 1-100,000 local infiltration Curettage performed in three different directions: Yes   Curettage cycles:  3 Lesion length (cm):  1 Lesion width (cm):  1 Margin per side (cm):  0 Final wound size (cm):  1 Hemostasis achieved with:  aluminum chloride Outcome: patient tolerated procedure well with no complications   Post-procedure details: wound care instructions given    Specimen 2 - Surgical pathology Differential Diagnosis: scc vs bcc txpbx Check Margins: No  After shave biopsy the base was treated with curettage plus cautery  Squamous cell carcinoma in situ (SCCIS) of skin of finger of right hand Right Proximal 3rd Finger  Skin / nail biopsy Type of biopsy: tangential   Informed consent: discussed and consent obtained   Timeout: patient name, date of birth, surgical site, and procedure verified   Anesthesia: the lesion was anesthetized in a standard fashion   Anesthetic:  1% lidocaine w/ epinephrine 1-100,000 local  infiltration Instrument used: flexible razor blade   Hemostasis achieved with: ferric subsulfate   Outcome: patient tolerated procedure well   Post-procedure details: wound care instructions given    Destruction of lesion Complexity: simple   Destruction method: electrodesiccation and curettage   Informed consent: discussed and consent obtained   Timeout:  patient name, date of birth, surgical site, and procedure verified Anesthesia: the lesion was anesthetized in a standard fashion   Anesthetic:  1% lidocaine w/ epinephrine 1-100,000 local infiltration Curettage performed in three different directions: Yes   Curettage cycles:  3 Lesion length (cm):  1 Lesion width (cm):  1 Margin per side (cm):  0 Final wound size (cm):  1 Hemostasis achieved with:  aluminum chloride Outcome: patient tolerated procedure well with no complications   Post-procedure details: wound care instructions given    Specimen 4 - Surgical pathology Differential Diagnosis: scc vs bcc txpbx Check Margins: No  After shave biopsy the base the lesion was treated with curettage plus cautery      I, Lavonna Monarch, MD, have reviewed all documentation for this visit.  The documentation on 09/08/21 for the exam, diagnosis, procedures, and orders are all accurate and complete.

## 2021-09-18 ENCOUNTER — Telehealth: Payer: Self-pay | Admitting: Diagnostic Neuroimaging

## 2021-09-18 DIAGNOSIS — R269 Unspecified abnormalities of gait and mobility: Secondary | ICD-10-CM

## 2021-09-18 NOTE — Telephone Encounter (Signed)
Pt's wife called stating that the pt is needing a new referral sent over next door in order for the pt to continue to receive PT. Please advise.

## 2021-09-18 NOTE — Telephone Encounter (Signed)
Called wife, Gary [name] to discuss the cardiology note from June:   Parkinsonism: Follows with neurology.  As long as he is not having any exertional symptoms concerning for angina, he may continue neuro rehab PT. She stated that he was having chest pain in early June but work up in hospital found colon issues, no heart issues. She stated he is fine now, walks some. She would like him to resume PT, stated it helped him  a lot and was good for him socially.  I advised will send her request to Dr Leta Baptist, only call her back if PT not ordered . She  verbalized understanding, appreciation.

## 2021-09-25 ENCOUNTER — Encounter: Payer: Self-pay | Admitting: Physical Therapy

## 2021-09-25 ENCOUNTER — Ambulatory Visit: Payer: Medicare PPO | Attending: Internal Medicine | Admitting: Physical Therapy

## 2021-09-25 DIAGNOSIS — R293 Abnormal posture: Secondary | ICD-10-CM | POA: Insufficient documentation

## 2021-09-25 DIAGNOSIS — R2681 Unsteadiness on feet: Secondary | ICD-10-CM | POA: Diagnosis present

## 2021-09-25 DIAGNOSIS — M6281 Muscle weakness (generalized): Secondary | ICD-10-CM | POA: Insufficient documentation

## 2021-09-25 DIAGNOSIS — R269 Unspecified abnormalities of gait and mobility: Secondary | ICD-10-CM | POA: Insufficient documentation

## 2021-09-25 DIAGNOSIS — R2689 Other abnormalities of gait and mobility: Secondary | ICD-10-CM | POA: Diagnosis present

## 2021-09-25 NOTE — Addendum Note (Signed)
Addended by: Excell Seltzer on: 09/25/2021 04:27 PM   Modules accepted: Orders

## 2021-09-25 NOTE — Therapy (Addendum)
OUTPATIENT PHYSICAL THERAPY NEURO EVALUATION   Patient Name: Gary [name] MRN: [id number] DOB:[birth year], 88 y.o., male Today's Date: 09/25/2021   PCP: Clovia Cuff, MD REFERRING PROVIDER: Penni Bombard, MD    PT End of Session - 09/25/21 1101     Visit Number 1    Number of Visits 13   plus eval   Date for PT Re-Evaluation 11/06/21    Authorization Type [employer]    PT Start Time 1100    PT Stop Time 1142    PT Time Calculation (min) 42 min    Activity Tolerance Patient tolerated treatment well    Behavior During Therapy WFL for tasks assessed/performed             Past Medical History:  Diagnosis Date   Adenomatous colon polyp    2010    Anemia    Anxiety    Arthritis    "hips and lower back"   Arthritis pain    Atypical mole 06/21/2003   left neck, inferior - slight to moderate atypia   Basal cell carcinoma 10/04/2014   right shin bcc (tx cx3, 49f )   Blood transfusion    Chest pain 03/29/2011   2D Echo without contrast on 03/29/11 - EF= 55-60%.   Cholecystitis    Chronic back pain greater than 3 months duration    Chronic kidney disease    Colon polyps    Complication of anesthesia    once slow to wake up   Coronary angioplasty status 2011   Coronary artery disease    Diarrhea    Diverticulosis    Dyslipidemia    GERD (gastroesophageal reflux disease)    GI bleeding after 07/2009   "triggered by Plavix & ASA; from my diverticulosis"   H/O Clostridium difficile infection    H/O myocardial perfusion scan 04/04/2011   For Pre-noncardiac surgery - EF = 67%, no ischemia, and is considered low risk.   Heart attack ([place] 07/2009   nonSTEMI with an occluded circumflex artery and collaterals.   Heart murmur    Hemorrhoids    History of shingles    Hyperlipidemia    Hypertension    Kidney stones    Lower extremity edema    Taking furosemide and it seems to be helping.   Neuromuscular disorder ([place]    Nodular infiltrative basal cell  carcinoma ([facility]CC) 02/08/2021   Right Forearm-anterior (tx p bx)   Nonspecific chest pain 08/26/2012   Went to ED 08/26/12   Parkinsonism ([place]    SCCA (squamous cell carcinoma) of skin 02/08/2021   Right Dorsal Hand (well diff) (tx p bx)   Squamous cell carcinoma of skin 05/13/2000   Right outer forehead   Squamous cell carcinoma of skin 03/02/2001   Post crown - tx 03/19/2001   Squamous cell carcinoma of skin 09/01/2001   Left post auricular - tx 10/09/2001   Squamous cell carcinoma of skin 06/21/2003   Top right ear - tx 07/05/2003   Squamous cell carcinoma of skin 06/21/2003   Right upper arm - tx 07/05/2003   Squamous cell carcinoma of skin 10/12/2003   Left superior ear lobe - MOHs   Squamous cell carcinoma of skin 12/15/2006   Right ear, superior - tx 01/26/2007   Squamous cell carcinoma of skin 03/13/2009   Right temple - tx 05/08/2009   Squamous cell carcinoma of skin 03/24/2013   Upper left forearm - tx p bx   Squamous cell carcinoma of  skin 12/06/2013   Right forearm - CX3 + 5FU   Squamous cell carcinoma of skin 02/10/2014   Right hand - tx p bx   Squamous cell carcinoma of skin 10/04/2014   Right shin - tx p bx   Squamous cell carcinoma of skin 08/21/2016   Left hand - tx p bx   Squamous cell carcinoma of skin 01/01/2017   Right hand - tx 02/06/2017   Squamous cell carcinoma of skin 10/21/2017   Left side of scalp - tx 03/12/2018   Squamous cell carcinoma of skin 10/21/2017   Left post scalp, inf - tx 08/13/2018   Squamous cell carcinoma of skin 10/21/2017   Right scalp - tx p bx   Squamous cell carcinoma of skin 10/21/2017   Left cheek - tx 03/12/2018   Squamous cell carcinoma of skin 08/13/2018   Left top hand, proximal pinky - tx p bx   Squamous cell carcinoma of skin 12/23/2018   Right bicep lower - tx p bx   Squamous cell carcinoma of skin 05/18/2019   Right 4th finger metacarpophalangeal joint   Squamous cell carcinoma of skin 05/18/2019   Left 5th  finger metacarpophalangeal joint   Squamous cell carcinoma of skin 05/18/2019   Left 2nd finger metacarpophalangeal joint   Stomach problems    Syncope and collapse 04/16/2018   Past Surgical History:  Procedure Laterality Date   [place]  08/09/2009   Circumflex 100% occluded with right-to-left collaterals, mild irregularities in the proximal and distal LAD and diagonal system, normal LV systolic function, and minor infrarenal irregularities, but no abdominal aortic aneurysm.   CHOLECYSTECTOMY  04/12/2011   Procedure: CHOLECYSTECTOMY;  Surgeon: Adin Hector, MD;  Location: [facility];  Service: General;  Laterality: N/A;   CHOLECYSTECTOMY  04/12/2011   Procedure: LAPAROSCOPIC CHOLECYSTECTOMY;  Surgeon: Adin Hector, MD;  Location: [facility];  Service: General;  Laterality: N/A;  converted to open   COLON SURGERY     COLONOSCOPY   ESOPHAGOGASTRODUODENOSCOPY N/A 08/05/2013   Procedure: ESOPHAGOGASTRODUODENOSCOPY (EGD);  Surgeon: Jerene Bears, MD;  Location: [facility];  Service: Gastroenterology;  Laterality: N/A;   EYE SURGERY  1942   "right eye was crossed; they  corrected it"   LUMBAR FUSION  07/22/2012   PACEMAKER IMPLANT N/A 04/17/2018   Procedure: PACEMAKER IMPLANT;  Surgeon: Deboraha Sprang, MD;  Location: [facility];  Service: Cardiovascular;  Laterality: N/A;   POSTERIOR FUSION LUMBAR SPINE  08/1995   "bottom 4 vertebra"   SUBTOTAL COLECTOMY  01/24/2010   TOTAL HIP ARTHROPLASTY Right 08/03/2013   Procedure: TOTAL HIP ARTHROPLASTY ANTERIOR APPROACH;  Surgeon: Hessie Dibble, MD;  Location: [facility];  Service: Orthopedics;  Laterality: Right;   Patient Active Problem List   Diagnosis Date Noted   Constipation 08/12/2021   Perforated bowel ([place]) 08/11/2021   Thrombocytopenia ([place]) 08/11/2021   Mood disorder ([place]) 08/11/2021   Chronic back pain greater than 3 months duration    Pneumoperitoneum    Vitamin B12 deficiency 04/15/2019   Abnormal  glucose 04/15/2019   Stage 4 chronic kidney disease ([place]) 04/15/2019   Dyspnea on exertion 04/15/2019   Pulmonary hypertension ([place]) 04/08/2019   Chest pain 03/27/2019   Complete heart block ([place]) 04/17/2018   Syncope and collapse 04/16/2018   Sinus pause 04/16/2018   Snoring 10/03/2017   Other fatigue 10/03/2017   Pre-syncope 05/14/2016   Murmur 05/14/2016   CAD in native  artery 10/31/2015   History of MI (myocardial infarction) 10/31/2015   Preoperative cardiovascular examination 12/26/2014   History of colonic polyps 01/17/2014   PAF (paroxysmal atrial fibrillation) (HCC) 39/76/7341   Metabolic acidosis 93/79/0240   Acute renal failure ([place]) 08/13/2013   Stage 3b chronic kidney disease ([place]) 08/13/2013   Anemia in chronic kidney disease 08/13/2013   Hypokalemia 08/12/2013   Acute blood loss anemia 08/09/2013   Encephalopathy 08/09/2013   Hypotension, unspecified 08/07/2013   Complex renal cyst 08/07/2013   Renal mass, left 08/06/2013   Acute esophagitis 08/05/2013   Urinary retention 08/04/2013   S/P total hip arthroplasty 08/03/2013   Degenerative joint disease (DJD) of hip 08/03/2013    Class: Chronic   Internal hemorrhoids with other complication 97/35/3299   Tinnitus 10/22/2012   C. difficile colitis 07/26/2012   Ileus, postoperative ([place]) 07/24/2012   Somnolence 07/24/2012   Anxiety state 07/24/2012   Chest pain- ER 08/26/12 03/30/2011   Fever 03/28/2011   Leucocytosis 03/28/2011   CKD (chronic kidney disease), stage III ([place]) 03/28/2011   Hyponatremia 02/02/2011   BPH (benign prostatic hyperplasia) 02/01/2011   Cholecystitis chronic, acute 01/26/2011   Dyslipidemia 01/22/2011   Abdominal bloating 08/07/2010   Loose stools 08/07/2010   Coronary atherosclerosis 10/26/2009   Essential hypertension 06/07/2009   DIVERTICULOSIS OF COLON 10/31/2008   DIVERTICULOSIS, COLON, WITH HEMORRHAGE, surg 12/11  10/31/2008   Personal history of colonic polyps 10/31/2008     ONSET DATE: 09/19/2021   REFERRING DIAG: R26.9 (ICD-10-CM) - Abnormality of gait and mobility   THERAPY DIAG:  Unsteadiness on feet  Muscle weakness (generalized)  Other abnormalities of gait and mobility  Rationale for Evaluation and Treatment Rehabilitation  SUBJECTIVE:                                                                                                                                                                                             SUBJECTIVE STATEMENT: Pt reports he as been experiencing some decreased endurance and feels that he fatigues more quickly with mobility. Pt also reports he is very HOH and is working on getting hearing aids. Pt states he enjoyed working on the [employer] and [employer] last time he was here for PT.  Pt accompanied by: self  PERTINENT HISTORY: anxiety, arthritis, basal cell carcinoma, back pain, chronic kidney disease, CAD, MI, heart murmur, HTN, lumbar fusion, pacemaker, right THR, mild memory loss, type 2 diabetes mellitus, heart block with pacer, chronic diastolic CHF, OSA with CPAP intolerance, and subtotal colectomy bleeding in 2011   PAIN:  Are you having pain? No  Occasionally has low back pain and  pain his joints but no pain currently.  PRECAUTIONS: Fall and ICD/Pacemaker  WEIGHT BEARING RESTRICTIONS No  FALLS: Has patient fallen in last 6 months? No  LIVING ENVIRONMENT: Lives with: lives with their family and lives with their spouse (daughter Freddi Starr lives with them) Lives in: House/apartment Stairs: Yes: External: 2 steps; none Has following equipment at home: Environmental consultant - 2 wheeled, Environmental consultant - 4 wheeled, shower chair, and Grab bars  PLOF: Independent with gait, Independent with transfers, and Requires assistive device for independence  PATIENT GOALS "I want to get to where I can be confident with a cane", "move better"  OBJECTIVE:   DIAGNOSTIC FINDINGS: None relevant to this admission  COGNITION: Overall cognitive  status: Impaired (mild memory impairments)   SENSATION: Reports N/T at times in BLE  COORDINATION: WFL heel to shin bilaterally  EDEMA:  Reports some swelling in BLE, takes lasix every other day Has pitting edema present BLE  POSTURE: rounded shoulders, forward head, posterior pelvic tilt, and flexed trunk   LOWER EXTREMITY ROM:     Passive  Right Eval Left Eval  Hip flexion    Hip extension    Hip abduction    Hip adduction    Hip internal rotation    Hip external rotation    Knee flexion    Knee extension Tight HS Tight HS  Ankle dorsiflexion    Ankle plantarflexion    Ankle inversion    Ankle eversion     (Blank rows = not tested)  LOWER EXTREMITY MMT:    MMT Right Eval Left Eval  Hip flexion 5 5  Hip extension    Hip abduction    Hip adduction    Hip internal rotation    Hip external rotation    Knee flexion 5 5  Knee extension 5 5  Ankle dorsiflexion 4 5  Ankle plantarflexion    Ankle inversion    Ankle eversion    (Blank rows = not tested)  BED MOBILITY:  Independent per pt report  TRANSFERS: Assistive device utilized: Environmental consultant - 2 wheeled  Sit to stand: Modified independence Stand to sit: Modified independence Chair to chair: Modified independence Floor:  not assessed  GAIT: Gait pattern: step through pattern, lateral lean- Left, decreased trunk rotation, trunk flexed, and narrow BOS Distance walked: various clinic distances Assistive device utilized: Environmental consultant - 2 wheeled Level of assistance: Modified independence Comments: significantly flexed trunk/kyphosis, postural deviations including L lateral trunk lean in standing  FUNCTIONAL TESTs:    [facility] PT Assessment - 09/25/21 1124       Ambulation/Gait   Gait velocity 32.8' over 30.12 sec = 1.09 ft/sec      Standardized Balance Assessment   Standardized Balance Assessment Timed Up and Go Test;Five Times Sit to Stand    Five times sit to stand comments  25.28 sec   BUE support on arms of  chair, no AD     Timed Up and Go Test   TUG Normal TUG    Normal TUG (seconds) 35.78   with RW            TODAY'S TREATMENT:  PT evaluation   PATIENT EDUCATION: Education details: Eval findings, POC Person educated: Patient Education method: Explanation Education comprehension: verbalized understanding   HOME EXERCISE PROGRAM: To be established next session   GOALS: Goals reviewed with patient? Yes  SHORT TERM GOALS: Target date: 10/16/2021  Initiate HEP Baseline: not established at eval Goal status: INITIAL  2.  Pt will improve  5 x STS to less than or equal to 20 seconds to demonstrate improved functional strength and transfer efficiency.  Baseline: 25.28 sec (8/15) Goal status: INITIAL  3.  Pt will improve normal TUG to less than or equal to 30 seconds for improved functional mobility and decreased fall risk. Baseline: 35.78 sec (8/15) Goal status: INITIAL  4.  Pt will improve gait velocity to at least 1.5 ft/sec for improved gait efficiency and performance at mod I level Baseline: 1.09 ft/sec Goal status: INITIAL  5.  Assess 6MWT and write STG and LTG Baseline: not assessed at eval Goal status: INITIAL   LONG TERM GOALS: Target date: 11/06/2021  Pt will be independent with final HEP for improved strength, balance, transfers and gait. Baseline: not established at eval Goal status: INITIAL  2.  Pt will improve 5 x STS to less than or equal to 15 seconds to demonstrate improved functional strength and transfer efficiency.  Baseline: 25.28 sec (8/15) Goal status: INITIAL  3.  Pt will improve normal TUG to less than or equal to 25 seconds for improved functional mobility and decreased fall risk. Baseline: 35.78 sec (8/15) Goal status: INITIAL  4.  Pt will improve gait velocity to at least 2.0 ft/sec for improved gait efficiency and performance at mod I level  Baseline: 1.09 ft/sec Goal status: INITIAL  5.  6MWT LTG to be written Baseline: not  assessed at eval Goal status: INITIAL   ASSESSMENT:  CLINICAL IMPRESSION: Patient is a 88 year old male referred to Neuro OPPT for abnormalities of gait and mobility.   Pt's PMH is significant for: anxiety, arthritis, basal cell carcinoma, back pain, chronic kidney disease, CAD, MI, heart murmur, HTN, lumbar fusion, pacemaker, right THR, mild memory loss, type 2 diabetes mellitus, heart block with pacer, chronic diastolic CHF, OSA with CPAP intolerance, and subtotal colectomy bleeding in 2011  The following deficits were present during the exam: decreased functional strength, decreased balance, decreased gait speed, and decreased endurance. Based on his 5xSTS score of 25.28 sec, TUG score of 35.78 sec, and gait speed of 1.09 ft/sec, pt is an increased risk for falls. Pt would benefit from skilled PT to address these impairments and functional limitations to maximize functional mobility independence.   OBJECTIVE IMPAIRMENTS Abnormal gait, decreased activity tolerance, decreased balance, decreased cognition, decreased endurance, decreased mobility, difficulty walking, decreased strength, decreased safety awareness, impaired sensation, and postural dysfunction.   ACTIVITY LIMITATIONS carrying, lifting, bending, standing, squatting, and stairs  PARTICIPATION LIMITATIONS: cleaning, laundry, driving, community activity, and church  PERSONAL FACTORS Age, Fitness, Transportation, and 3+ comorbidities:    anxiety, arthritis, basal cell carcinoma, back pain, chronic kidney disease, CAD, MI, heart murmur, HTN, lumbar fusion, pacemaker, right THR, mild memory loss, type 2 diabetes mellitus, heart block with pacer, chronic diastolic CHF, OSA with CPAP intolerance, and subtotal colectomy bleeding in 2011 are also affecting patient's functional outcome.   REHAB POTENTIAL: Good  CLINICAL DECISION MAKING: Evolving/moderate complexity  EVALUATION COMPLEXITY: Moderate  PLAN: PT FREQUENCY: 2x/week  PT  DURATION: 6 weeks  PLANNED INTERVENTIONS: Therapeutic exercises, Therapeutic activity, Neuromuscular re-education, Balance training, Gait training, Patient/Family education, Self Care, Joint mobilization, Stair training, DME instructions, Cryotherapy, Moist heat, Taping, Manual therapy, and Re-evaluation  PLAN FOR NEXT SESSION: establish HEP for balance/strength/endurance, assess 6MWT   Excell Seltzer, PT, DPT, CSRS 09/25/2021, 11:45 AM

## 2021-09-28 ENCOUNTER — Ambulatory Visit (INDEPENDENT_AMBULATORY_CARE_PROVIDER_SITE_OTHER): Payer: Medicare PPO

## 2021-09-28 DIAGNOSIS — I442 Atrioventricular block, complete: Secondary | ICD-10-CM

## 2021-09-28 LAB — CUP PACEART REMOTE DEVICE CHECK
Battery Remaining Longevity: 100 mo
Battery Voltage: 3 V
Brady Statistic AP VP Percent: 56.65 %
Brady Statistic AP VS Percent: 0.2 %
Brady Statistic AS VP Percent: 42.49 %
Brady Statistic AS VS Percent: 0.66 %
Brady Statistic RA Percent Paced: 56.58 %
Brady Statistic RV Percent Paced: 99.14 %
Date Time Interrogation Session: 20230818015545
Implantable Lead Implant Date: 20200306
Implantable Lead Implant Date: 20200306
Implantable Lead Location: 753859
Implantable Lead Location: 753860
Implantable Lead Model: 5076
Implantable Lead Model: 5076
Implantable Pulse Generator Implant Date: 20200306
Lead Channel Impedance Value: 323 Ohm
Lead Channel Impedance Value: 361 Ohm
Lead Channel Impedance Value: 361 Ohm
Lead Channel Impedance Value: 418 Ohm
Lead Channel Pacing Threshold Amplitude: 0.5 V
Lead Channel Pacing Threshold Amplitude: 0.5 V
Lead Channel Pacing Threshold Pulse Width: 0.4 ms
Lead Channel Pacing Threshold Pulse Width: 0.4 ms
Lead Channel Sensing Intrinsic Amplitude: 0.625 mV
Lead Channel Sensing Intrinsic Amplitude: 0.625 mV
Lead Channel Sensing Intrinsic Amplitude: 8.625 mV
Lead Channel Sensing Intrinsic Amplitude: 8.625 mV
Lead Channel Setting Pacing Amplitude: 1.5 V
Lead Channel Setting Pacing Amplitude: 2.5 V
Lead Channel Setting Pacing Pulse Width: 0.4 ms
Lead Channel Setting Sensing Sensitivity: 0.6 mV

## 2021-10-01 ENCOUNTER — Encounter: Payer: Self-pay | Admitting: Physical Therapy

## 2021-10-01 ENCOUNTER — Ambulatory Visit: Payer: Medicare PPO | Admitting: Physical Therapy

## 2021-10-01 DIAGNOSIS — R2689 Other abnormalities of gait and mobility: Secondary | ICD-10-CM

## 2021-10-01 DIAGNOSIS — R2681 Unsteadiness on feet: Secondary | ICD-10-CM

## 2021-10-01 DIAGNOSIS — M6281 Muscle weakness (generalized): Secondary | ICD-10-CM

## 2021-10-01 NOTE — Therapy (Signed)
OUTPATIENT PHYSICAL THERAPY NEURO TREATMENT   Patient Name: Gary [name] MRN: [id number] DOB:04-30-1932, 88 y.o., male Today's Date: 10/01/2021   PCP: Clovia Cuff, MD REFERRING PROVIDER: Penni Bombard, MD    PT End of Session - 10/01/21 1152     Visit Number 2    Number of Visits 13   plus eval   Date for PT Re-Evaluation 11/06/21    Authorization Type [employer]    Progress Note Due on Visit 10    PT Start Time 1150    PT Stop Time 1232    PT Time Calculation (min) 42 min    Activity Tolerance Patient tolerated treatment well    Behavior During Therapy WFL for tasks assessed/performed              Past Medical History:  Diagnosis Date   Adenomatous colon polyp    2010    Anemia    Anxiety    Arthritis    "hips and lower back"   Arthritis pain    Atypical mole 06/21/2003   left neck, inferior - slight to moderate atypia   Basal cell carcinoma 10/04/2014   right shin bcc (tx cx3, 15f )   Blood transfusion    Chest pain 03/29/2011   2D Echo without contrast on 03/29/11 - EF= 55-60%.   Cholecystitis    Chronic back pain greater than 3 months duration    Chronic kidney disease    Colon polyps    Complication of anesthesia    once slow to wake up   Coronary angioplasty status 2011   Coronary artery disease    Diarrhea    Diverticulosis    Dyslipidemia    GERD (gastroesophageal reflux disease)    GI bleeding after 07/2009   "triggered by Plavix & ASA; from my diverticulosis"   H/O Clostridium difficile infection    H/O myocardial perfusion scan 04/04/2011   For Pre-noncardiac surgery - EF = 67%, no ischemia, and is considered low risk.   Heart attack ([place] 07/2009   nonSTEMI with an occluded circumflex artery and collaterals.   Heart murmur    Hemorrhoids    History of shingles    Hyperlipidemia    Hypertension    Kidney stones    Lower extremity edema    Taking furosemide and it seems to be helping.   Neuromuscular disorder ([place]     Nodular infiltrative basal cell carcinoma (BCC) 02/08/2021   Right Forearm-anterior (tx p bx)   Nonspecific chest pain 08/26/2012   Went to ED 08/26/12   Parkinsonism ([place]    SCCA (squamous cell carcinoma) of skin 02/08/2021   Right Dorsal Hand (well diff) (tx p bx)   Squamous cell carcinoma of skin 05/13/2000   Right outer forehead   Squamous cell carcinoma of skin 03/02/2001   Post crown - tx 03/19/2001   Squamous cell carcinoma of skin 09/01/2001   Left post auricular - tx 10/09/2001   Squamous cell carcinoma of skin 06/21/2003   Top right ear - tx 07/05/2003   Squamous cell carcinoma of skin 06/21/2003   Right upper arm - tx 07/05/2003   Squamous cell carcinoma of skin 10/12/2003   Left superior ear lobe - MOHs   Squamous cell carcinoma of skin 12/15/2006   Right ear, superior - tx 01/26/2007   Squamous cell carcinoma of skin 03/13/2009   Right temple - tx 05/08/2009   Squamous cell carcinoma of skin 03/24/2013   Upper left forearm -  tx p bx   Squamous cell carcinoma of skin 12/06/2013   Right forearm - CX3 + 5FU   Squamous cell carcinoma of skin 02/10/2014   Right hand - tx p bx   Squamous cell carcinoma of skin 10/04/2014   Right shin - tx p bx   Squamous cell carcinoma of skin 08/21/2016   Left hand - tx p bx   Squamous cell carcinoma of skin 01/01/2017   Right hand - tx 02/06/2017   Squamous cell carcinoma of skin 10/21/2017   Left side of scalp - tx 03/12/2018   Squamous cell carcinoma of skin 10/21/2017   Left post scalp, inf - tx 08/13/2018   Squamous cell carcinoma of skin 10/21/2017   Right scalp - tx p bx   Squamous cell carcinoma of skin 10/21/2017   Left cheek - tx 03/12/2018   Squamous cell carcinoma of skin 08/13/2018   Left top hand, proximal pinky - tx p bx   Squamous cell carcinoma of skin 12/23/2018   Right bicep lower - tx p bx   Squamous cell carcinoma of skin 05/18/2019   Right 4th finger metacarpophalangeal joint   Squamous cell carcinoma of  skin 05/18/2019   Left 5th finger metacarpophalangeal joint   Squamous cell carcinoma of skin 05/18/2019   Left 2nd finger metacarpophalangeal joint   Stomach problems    Syncope and collapse 04/16/2018   Past Surgical History:  Procedure Laterality Date   [place]  08/09/2009   Circumflex 100% occluded with right-to-left collaterals, mild irregularities in the proximal and distal LAD and diagonal system, normal LV systolic function, and minor infrarenal irregularities, but no abdominal aortic aneurysm.   CHOLECYSTECTOMY  04/12/2011   Procedure: CHOLECYSTECTOMY;  Surgeon: Adin Hector, MD;  Location: [facility];  Service: General;  Laterality: N/A;   CHOLECYSTECTOMY  04/12/2011   Procedure: LAPAROSCOPIC CHOLECYSTECTOMY;  Surgeon: Adin Hector, MD;  Location: [facility];  Service: General;  Laterality: N/A;  converted to open   COLON SURGERY     COLONOSCOPY   ESOPHAGOGASTRODUODENOSCOPY N/A 08/05/2013   Procedure: ESOPHAGOGASTRODUODENOSCOPY (EGD);  Surgeon: Jerene Bears, MD;  Location: [facility];  Service: Gastroenterology;  Laterality: N/A;   EYE SURGERY  1942   "right eye was crossed; they  corrected it"   LUMBAR FUSION  07/22/2012   PACEMAKER IMPLANT N/A 04/17/2018   Procedure: PACEMAKER IMPLANT;  Surgeon: Deboraha Sprang, MD;  Location: [facility];  Service: Cardiovascular;  Laterality: N/A;   POSTERIOR FUSION LUMBAR SPINE  08/1995   "bottom 4 vertebra"   SUBTOTAL COLECTOMY  01/24/2010   TOTAL HIP ARTHROPLASTY Right 08/03/2013   Procedure: TOTAL HIP ARTHROPLASTY ANTERIOR APPROACH;  Surgeon: Hessie Dibble, MD;  Location: [facility];  Service: Orthopedics;  Laterality: Right;   Patient Active Problem List   Diagnosis Date Noted   Constipation 08/12/2021   Perforated bowel ([place]) 08/11/2021   Thrombocytopenia ([place]) 08/11/2021   Mood disorder ([place]) 08/11/2021   Chronic back pain greater than 3 months duration    Pneumoperitoneum    Vitamin B12  deficiency 04/15/2019   Abnormal glucose 04/15/2019   Stage 4 chronic kidney disease ([place]) 04/15/2019   Dyspnea on exertion 04/15/2019   Pulmonary hypertension ([place]) 04/08/2019   Chest pain 03/27/2019   Complete heart block ([place]) 04/17/2018   Syncope and collapse 04/16/2018   Sinus pause 04/16/2018   Snoring 10/03/2017   Other fatigue 10/03/2017   Pre-syncope 05/14/2016  Murmur 05/14/2016   CAD in native artery 10/31/2015   History of MI (myocardial infarction) 10/31/2015   Preoperative cardiovascular examination 12/26/2014   History of colonic polyps 01/17/2014   PAF (paroxysmal atrial fibrillation) (HCC) 60/63/0160   Metabolic acidosis 10/93/2355   Acute renal failure ([place]) 08/13/2013   Stage 3b chronic kidney disease ([place]) 08/13/2013   Anemia in chronic kidney disease 08/13/2013   Hypokalemia 08/12/2013   Acute blood loss anemia 08/09/2013   Encephalopathy 08/09/2013   Hypotension, unspecified 08/07/2013   Complex renal cyst 08/07/2013   Renal mass, left 08/06/2013   Acute esophagitis 08/05/2013   Urinary retention 08/04/2013   S/P total hip arthroplasty 08/03/2013   Degenerative joint disease (DJD) of hip 08/03/2013    Class: Chronic   Internal hemorrhoids with other complication 73/22/0254   Tinnitus 10/22/2012   C. difficile colitis 07/26/2012   Ileus, postoperative ([place]) 07/24/2012   Somnolence 07/24/2012   Anxiety state 07/24/2012   Chest pain- ER 08/26/12 03/30/2011   Fever 03/28/2011   Leucocytosis 03/28/2011   CKD (chronic kidney disease), stage III ([place]) 03/28/2011   Hyponatremia 02/02/2011   BPH (benign prostatic hyperplasia) 02/01/2011   Cholecystitis chronic, acute 01/26/2011   Dyslipidemia 01/22/2011   Abdominal bloating 08/07/2010   Loose stools 08/07/2010   Coronary atherosclerosis 10/26/2009   Essential hypertension 06/07/2009   DIVERTICULOSIS OF COLON 10/31/2008   DIVERTICULOSIS, COLON, WITH HEMORRHAGE, surg 12/11  10/31/2008   Personal history  of colonic polyps 10/31/2008    ONSET DATE: 09/19/2021   REFERRING DIAG: R26.9 (ICD-10-CM) - Abnormality of gait and mobility   THERAPY DIAG:  Unsteadiness on feet  Muscle weakness (generalized)  Other abnormalities of gait and mobility  Rationale for Evaluation and Treatment Rehabilitation  SUBJECTIVE:                                                                                                                                                                                             SUBJECTIVE STATEMENT: Pt reports he has been going through some "gruesome" things since last visit. Upon further discussion pt is describing his most recent hospital stay, prior to this course of treatment. No falls and no near falls.  Pt accompanied by: self  PERTINENT HISTORY: anxiety, arthritis, basal cell carcinoma, back pain, chronic kidney disease, CAD, MI, heart murmur, HTN, lumbar fusion, pacemaker, right THR, mild memory loss, type 2 diabetes mellitus, heart block with pacer, chronic diastolic CHF, OSA with CPAP intolerance, and subtotal colectomy bleeding in 2011   PAIN:  Are you having pain? No  Occasionally has low back pain and pain his joints but no pain  currently.  PRECAUTIONS: Fall and ICD/Pacemaker  FALLS: Has patient fallen in last 6 months? No   PATIENT GOALS "I want to get to where I can be confident with a cane", "move better"  OBJECTIVE:    TODAY'S TREATMENT:   THER ACT: 6MWT: 334 ft with RW at mod I level, seated rest breat at 4:15 and unable to ambulate further SpO2 94%, HR 90 bpm, RPE of 7/10  THER EX: Nustep level 3 x 5 min with use of B UE/LE for global endurance training, 70 steps/minute    Initiated HEP, see bolded below   PATIENT EDUCATION: Education details: initiated HEP Person educated: Patient Education method: Theatre stage manager Education comprehension: verbalized understanding   HOME EXERCISE PROGRAM: Access Code: [email or web address] URL:  [email or web address] Date: 10/01/2021 Prepared by: Excell Seltzer  Exercises - Sit to Stand with Hands on Knees  - 1 x daily - 7 x weekly - 3 sets - 10 reps - Side Stepping with Counter Support  - 1 x daily - 7 x weekly - 1 sets - 10 reps - Standing March with Counter Support  - 1 x daily - 7 x weekly - 3 sets - 10 reps  Walking Program: Walk with your family. Walk during cooler times of the day. Starting next week, walk 5 min per day for 7 days. Following week add 5 minutes to your total time. Week 1: 10 min Week 2: 15 min Week 3: 20 min Week 4: 25 min...Marland KitchenMarland KitchenUntil you can walk to 30 min per day    GOALS: Goals reviewed with patient? Yes  SHORT TERM GOALS: Target date: 10/16/2021  Initiate HEP Baseline: not established at eval Goal status: INITIAL  2.  Pt will improve 5 x STS to less than or equal to 20 seconds to demonstrate improved functional strength and transfer efficiency.  Baseline: 25.28 sec (8/15) Goal status: INITIAL  3.  Pt will improve normal TUG to less than or equal to 30 seconds for improved functional mobility and decreased fall risk. Baseline: 35.78 sec (8/15) Goal status: INITIAL  4.  Pt will improve gait velocity to at least 1.5 ft/sec for improved gait efficiency and performance at mod I level Baseline: 1.09 ft/sec Goal status: INITIAL  5.  Pt will improve score on 6MWT to 450 ft with LRAD with no seated rest breaks to demonstrate improved endurance Baseline: 334 ft with RW with one seated rest break (8/21) Goal status: INITIAL   LONG TERM GOALS: Target date: 11/06/2021  Pt will be independent with final HEP for improved strength, balance, transfers and gait. Baseline: not established at eval Goal status: INITIAL  2.  Pt will improve 5 x STS to less than or equal to 15 seconds to demonstrate improved functional strength and transfer efficiency.  Baseline: 25.28 sec (8/15) Goal status: INITIAL  3.  Pt will improve normal TUG to less  than or equal to 25 seconds for improved functional mobility and decreased fall risk. Baseline: 35.78 sec (8/15) Goal status: INITIAL  4.  Pt will improve gait velocity to at least 2.0 ft/sec for improved gait efficiency and performance at mod I level  Baseline: 1.09 ft/sec Goal status: INITIAL  5.  Pt will improve score on 6MWT to 500 ft with LRAD with no seated rest break to demonstrate improved endurance Baseline: 334 ft with RW with one seated rest break (8/21) Goal status: INITIAL   ASSESSMENT:  CLINICAL IMPRESSION: Emphasis of skilled PT session on administering 6MWT and initiating HEP  with patient. Pt is able to ambulate x 334 ft with RW with one seated rest break needed, vitals WNL (see above). Pt demonstrates impaired endurance as evidence by score on 6MWT. Initiated HEP with patient and handout provided. Continue POC.   OBJECTIVE IMPAIRMENTS Abnormal gait, decreased activity tolerance, decreased balance, decreased cognition, decreased endurance, decreased mobility, difficulty walking, decreased strength, decreased safety awareness, impaired sensation, and postural dysfunction.   ACTIVITY LIMITATIONS carrying, lifting, bending, standing, squatting, and stairs  PARTICIPATION LIMITATIONS: cleaning, laundry, driving, community activity, and church  PERSONAL FACTORS Age, Fitness, Transportation, and 3+ comorbidities:    anxiety, arthritis, basal cell carcinoma, back pain, chronic kidney disease, CAD, MI, heart murmur, HTN, lumbar fusion, pacemaker, right THR, mild memory loss, type 2 diabetes mellitus, heart block with pacer, chronic diastolic CHF, OSA with CPAP intolerance, and subtotal colectomy bleeding in 2011 are also affecting patient's functional outcome.   REHAB POTENTIAL: Good  CLINICAL DECISION MAKING: Evolving/moderate complexity  EVALUATION COMPLEXITY: Moderate  PLAN: PT FREQUENCY: 2x/week  PT DURATION: 6 weeks  PLANNED INTERVENTIONS: Therapeutic exercises,  Therapeutic activity, Neuromuscular re-education, Balance training, Gait training, Patient/Family education, Self Care, Joint mobilization, Stair training, DME instructions, Cryotherapy, Moist heat, Taping, Manual therapy, and Re-evaluation  PLAN FOR NEXT SESSION: add to HEP for balance/strength/endurance, work on endurance, balance, LE strengthening, SciFit/Nustep  Excell Seltzer, PT, DPT, CSRS 10/01/2021, 12:33 PM

## 2021-10-03 ENCOUNTER — Ambulatory Visit: Payer: Medicare PPO | Admitting: Physical Therapy

## 2021-10-03 VITALS — HR 91

## 2021-10-03 DIAGNOSIS — M6281 Muscle weakness (generalized): Secondary | ICD-10-CM

## 2021-10-03 DIAGNOSIS — R2681 Unsteadiness on feet: Secondary | ICD-10-CM

## 2021-10-03 DIAGNOSIS — R2689 Other abnormalities of gait and mobility: Secondary | ICD-10-CM

## 2021-10-03 NOTE — Therapy (Signed)
OUTPATIENT PHYSICAL THERAPY NEURO TREATMENT   Patient Name: Gary [name] MRN: [id number] DOB:20-Sep-1932, 88 y.o., male Today's Date: 10/03/2021   PCP: Clovia Cuff, MD REFERRING PROVIDER: Penni Bombard, MD    PT End of Session - 10/03/21 1403     Visit Number 3    Number of Visits 13   plus eval   Date for PT Re-Evaluation 11/06/21    Authorization Type [employer]    Progress Note Due on Visit 10    PT Start Time 1402    PT Stop Time 1443    PT Time Calculation (min) 41 min    Activity Tolerance Patient tolerated treatment well    Behavior During Therapy WFL for tasks assessed/performed               Past Medical History:  Diagnosis Date   Adenomatous colon polyp    2010    Anemia    Anxiety    Arthritis    "hips and lower back"   Arthritis pain    Atypical mole 06/21/2003   left neck, inferior - slight to moderate atypia   Basal cell carcinoma 10/04/2014   right shin bcc (tx cx3, 63f )   Blood transfusion    Chest pain 03/29/2011   2D Echo without contrast on 03/29/11 - EF= 55-60%.   Cholecystitis    Chronic back pain greater than 3 months duration    Chronic kidney disease    Colon polyps    Complication of anesthesia    once slow to wake up   Coronary angioplasty status 2011   Coronary artery disease    Diarrhea    Diverticulosis    Dyslipidemia    GERD (gastroesophageal reflux disease)    GI bleeding after 07/2009   "triggered by Plavix & ASA; from my diverticulosis"   H/O Clostridium difficile infection    H/O myocardial perfusion scan 04/04/2011   For Pre-noncardiac surgery - EF = 67%, no ischemia, and is considered low risk.   Heart attack ([place] 07/2009   nonSTEMI with an occluded circumflex artery and collaterals.   Heart murmur    Hemorrhoids    History of shingles    Hyperlipidemia    Hypertension    Kidney stones    Lower extremity edema    Taking furosemide and it seems to be helping.   Neuromuscular disorder ([place]     Nodular infiltrative basal cell carcinoma (BCC) 02/08/2021   Right Forearm-anterior (tx p bx)   Nonspecific chest pain 08/26/2012   Went to ED 08/26/12   [place] ([place]    SCCA (squamous cell carcinoma) of skin 02/08/2021   Right Dorsal Hand (well diff) (tx p bx)   Squamous cell carcinoma of skin 05/13/2000   Right outer forehead   Squamous cell carcinoma of skin 03/02/2001   Post crown - tx 03/19/2001   Squamous cell carcinoma of skin 09/01/2001   Left post auricular - tx 10/09/2001   Squamous cell carcinoma of skin 06/21/2003   Top right ear - tx 07/05/2003   Squamous cell carcinoma of skin 06/21/2003   Right upper arm - tx 07/05/2003   Squamous cell carcinoma of skin 10/12/2003   Left superior ear lobe - MOHs   Squamous cell carcinoma of skin 12/15/2006   Right ear, superior - tx 01/26/2007   Squamous cell carcinoma of skin 03/13/2009   Right temple - tx 05/08/2009   Squamous cell carcinoma of skin 03/24/2013   Upper left  forearm - tx p bx   Squamous cell carcinoma of skin 12/06/2013   Right forearm - CX3 + 5FU   Squamous cell carcinoma of skin 02/10/2014   Right hand - tx p bx   Squamous cell carcinoma of skin 10/04/2014   Right shin - tx p bx   Squamous cell carcinoma of skin 08/21/2016   Left hand - tx p bx   Squamous cell carcinoma of skin 01/01/2017   Right hand - tx 02/06/2017   Squamous cell carcinoma of skin 10/21/2017   Left side of scalp - tx 03/12/2018   Squamous cell carcinoma of skin 10/21/2017   Left post scalp, inf - tx 08/13/2018   Squamous cell carcinoma of skin 10/21/2017   Right scalp - tx p bx   Squamous cell carcinoma of skin 10/21/2017   Left cheek - tx 03/12/2018   Squamous cell carcinoma of skin 08/13/2018   Left top hand, proximal pinky - tx p bx   Squamous cell carcinoma of skin 12/23/2018   Right bicep lower - tx p bx   Squamous cell carcinoma of skin 05/18/2019   Right 4th finger metacarpophalangeal joint   Squamous cell carcinoma  of skin 05/18/2019   Left 5th finger metacarpophalangeal joint   Squamous cell carcinoma of skin 05/18/2019   Left 2nd finger metacarpophalangeal joint   Stomach problems    Syncope and collapse 04/16/2018   Past Surgical History:  Procedure Laterality Date   [place]  08/09/2009   Circumflex 100% occluded with right-to-left collaterals, mild irregularities in the proximal and distal LAD and diagonal system, normal LV systolic function, and minor infrarenal irregularities, but no abdominal aortic aneurysm.   CHOLECYSTECTOMY  04/12/2011   Procedure: CHOLECYSTECTOMY;  Surgeon: Adin Hector, MD;  Location: [facility];  Service: General;  Laterality: N/A;   CHOLECYSTECTOMY  04/12/2011   Procedure: LAPAROSCOPIC CHOLECYSTECTOMY;  Surgeon: Adin Hector, MD;  Location: [facility];  Service: General;  Laterality: N/A;  converted to open   COLON SURGERY     COLONOSCOPY   ESOPHAGOGASTRODUODENOSCOPY N/A 08/05/2013   Procedure: ESOPHAGOGASTRODUODENOSCOPY (EGD);  Surgeon: Jerene Bears, MD;  Location: [facility];  Service: Gastroenterology;  Laterality: N/A;   EYE SURGERY  1942   "right eye was crossed; they  corrected it"   LUMBAR FUSION  07/22/2012   PACEMAKER IMPLANT N/A 04/17/2018   Procedure: PACEMAKER IMPLANT;  Surgeon: Deboraha Sprang, MD;  Location: [facility];  Service: Cardiovascular;  Laterality: N/A;   POSTERIOR FUSION LUMBAR SPINE  08/1995   "bottom 4 vertebra"   SUBTOTAL COLECTOMY  01/24/2010   TOTAL HIP ARTHROPLASTY Right 08/03/2013   Procedure: TOTAL HIP ARTHROPLASTY ANTERIOR APPROACH;  Surgeon: Hessie Dibble, MD;  Location: [facility];  Service: Orthopedics;  Laterality: Right;   Patient Active Problem List   Diagnosis Date Noted   Constipation 08/12/2021   Perforated bowel ([place]) 08/11/2021   Thrombocytopenia ([place]) 08/11/2021   Mood disorder ([place]) 08/11/2021   Chronic back pain greater than 3 months duration    Pneumoperitoneum    Vitamin B12  deficiency 04/15/2019   Abnormal glucose 04/15/2019   Stage 4 chronic kidney disease ([place]) 04/15/2019   Dyspnea on exertion 04/15/2019   Pulmonary hypertension ([place]) 04/08/2019   Chest pain 03/27/2019   Complete heart block ([place]) 04/17/2018   Syncope and collapse 04/16/2018   Sinus pause 04/16/2018   Snoring 10/03/2017   Other fatigue 10/03/2017  Pre-syncope 05/14/2016   Murmur 05/14/2016   CAD in native artery 10/31/2015   History of MI (myocardial infarction) 10/31/2015   Preoperative cardiovascular examination 12/26/2014   History of colonic polyps 01/17/2014   PAF (paroxysmal atrial fibrillation) (HCC) 27/04/5007   Metabolic acidosis 38/18/2993   Acute renal failure ([place]) 08/13/2013   Stage 3b chronic kidney disease ([place]) 08/13/2013   Anemia in chronic kidney disease 08/13/2013   Hypokalemia 08/12/2013   Acute blood loss anemia 08/09/2013   Encephalopathy 08/09/2013   Hypotension, unspecified 08/07/2013   Complex renal cyst 08/07/2013   Renal mass, left 08/06/2013   Acute esophagitis 08/05/2013   Urinary retention 08/04/2013   S/P total hip arthroplasty 08/03/2013   Degenerative joint disease (DJD) of hip 08/03/2013    Class: Chronic   Internal hemorrhoids with other complication 71/69/6789   Tinnitus 10/22/2012   C. difficile colitis 07/26/2012   Ileus, postoperative ([place]) 07/24/2012   Somnolence 07/24/2012   Anxiety state 07/24/2012   Chest pain- ER 08/26/12 03/30/2011   Fever 03/28/2011   Leucocytosis 03/28/2011   CKD (chronic kidney disease), stage III ([place]) 03/28/2011   Hyponatremia 02/02/2011   BPH (benign prostatic hyperplasia) 02/01/2011   Cholecystitis chronic, acute 01/26/2011   Dyslipidemia 01/22/2011   Abdominal bloating 08/07/2010   Loose stools 08/07/2010   Coronary atherosclerosis 10/26/2009   Essential hypertension 06/07/2009   DIVERTICULOSIS OF COLON 10/31/2008   DIVERTICULOSIS, COLON, WITH HEMORRHAGE, surg 12/11  10/31/2008   Personal history  of colonic polyps 10/31/2008    ONSET DATE: 09/19/2021   REFERRING DIAG: R26.9 (ICD-10-CM) - Abnormality of gait and mobility   THERAPY DIAG:  Unsteadiness on feet  Muscle weakness (generalized)  Other abnormalities of gait and mobility  Rationale for Evaluation and Treatment Rehabilitation  SUBJECTIVE:                                                                                                                                                                                             SUBJECTIVE STATEMENT: "I guess my main purpose of being here comes down to my balance". Pt reports he has "had a rough go of it" recently with his stay in the hospital and regurgitating his PMH. "I have started calling it [place] [employer]"   Pt accompanied by: self  PERTINENT HISTORY: anxiety, arthritis, basal cell carcinoma, back pain, chronic kidney disease, CAD, MI, heart murmur, HTN, lumbar fusion, pacemaker, right THR, mild memory loss, type 2 diabetes mellitus, heart block with pacer, chronic diastolic CHF, OSA with CPAP intolerance, and subtotal colectomy bleeding in 2011   PAIN:  Are you having pain? Yes:  NPRS scale: 3/10 Pain location: low back Pain description: achy/sharp   VITALS  Vitals:   10/03/21 1412 10/03/21 1423  Pulse: 71 91  SpO2: 95% 93%     PRECAUTIONS: Fall and ICD/Pacemaker  FALLS: Has patient fallen in last 6 months? No   PATIENT GOALS "I want to get to where I can be confident with a cane", "move better"  OBJECTIVE:   TODAY'S TREATMENT:  Ther Ex  SciFit level 2 for 8 minutes using BUE/BLEs for dynamic cardiovascular conditioning, endurance and global strengthening. RPE of 3/10 following activity. Vitals assessed before and after activity, all WNL  In // bars for improved BLE coordination, BLE strength, dynamic balance and single leg stability:  -Fwd 4" hurdle navigation w/BUE support using step-to pattern, x3 leading w/RLE and x3 leading w/LLE.  Noted increased difficulty when leading w/LLE. Attempted to perform single rep of step-through pattern, but pt frequently knocking hurdles over.  -Lateral 4" hurdle navigation w/BUE support, x3 each direction. Pt reported increased fatigue in lateral direction compared to fwd  -Standing on Airex without UE support, 2x60s. Noted minor A/P sway but no major LOB -Alt retro lunge from airex w/BUE support, x8 per side. Min cues for increased step clearance w/LLE     PATIENT EDUCATION: Education details: continue HEP Person educated: Patient Education method: Theatre stage manager Education comprehension: verbalized understanding   HOME EXERCISE PROGRAM: Access Code: [email or web address] URL: [email or web address] Date: 10/01/2021 Prepared by: Excell Seltzer  Exercises - Sit to Stand with Hands on Knees  - 1 x daily - 7 x weekly - 3 sets - 10 reps - Side Stepping with Counter Support  - 1 x daily - 7 x weekly - 1 sets - 10 reps - Standing March with Counter Support  - 1 x daily - 7 x weekly - 3 sets - 10 reps  Walking Program: Walk with your family. Walk during cooler times of the day. Starting next week, walk 5 min per day for 7 days. Following week add 5 minutes to your total time. Week 1: 10 min Week 2: 15 min Week 3: 20 min Week 4: 25 min...Marland KitchenMarland KitchenUntil you can walk to 30 min per day    GOALS: Goals reviewed with patient? Yes  SHORT TERM GOALS: Target date: 10/16/2021  Initiate HEP Baseline: not established at eval Goal status: INITIAL  2.  Pt will improve 5 x STS to less than or equal to 20 seconds to demonstrate improved functional strength and transfer efficiency.  Baseline: 25.28 sec (8/15) Goal status: INITIAL  3.  Pt will improve normal TUG to less than or equal to 30 seconds for improved functional mobility and decreased fall risk. Baseline: 35.78 sec (8/15) Goal status: INITIAL  4.  Pt will improve gait velocity to at least 1.5 ft/sec for improved gait  efficiency and performance at mod I level Baseline: 1.09 ft/sec Goal status: INITIAL  5.  Pt will improve score on 6MWT to 450 ft with LRAD with no seated rest breaks to demonstrate improved endurance Baseline: 334 ft with RW with one seated rest break (8/21) Goal status: INITIAL   LONG TERM GOALS: Target date: 11/06/2021  Pt will be independent with final HEP for improved strength, balance, transfers and gait. Baseline: not established at eval Goal status: INITIAL  2.  Pt will improve 5 x STS to less than or equal to 15 seconds to demonstrate improved functional strength and transfer efficiency.  Baseline: 25.28 sec (8/15) Goal status: INITIAL  3.  Pt will improve normal TUG to less than or equal to 25 seconds for improved functional mobility and decreased fall risk. Baseline: 35.78 sec (8/15) Goal status: INITIAL  4.  Pt will improve gait velocity to at least 2.0 ft/sec for improved gait efficiency and performance at mod I level  Baseline: 1.09 ft/sec Goal status: INITIAL  5.  Pt will improve score on 6MWT to 500 ft with LRAD with no seated rest break to demonstrate improved endurance Baseline: 334 ft with RW with one seated rest break (8/21) Goal status: INITIAL   ASSESSMENT:  CLINICAL IMPRESSION: Emphasis of skilled PT session on global strengthening, endurance and dynamic balance. Session limited due to pt being hyperverbal and requiring frequent redirection throughout session. Pt tolerated session well, w/SPO2 dropping to 93% on RA after activity. Pt reported minimal low back pain today, especially w/static standing. Continue POC.    OBJECTIVE IMPAIRMENTS Abnormal gait, decreased activity tolerance, decreased balance, decreased cognition, decreased endurance, decreased mobility, difficulty walking, decreased strength, decreased safety awareness, impaired sensation, and postural dysfunction.   ACTIVITY LIMITATIONS carrying, lifting, bending, standing, squatting, and  stairs  PARTICIPATION LIMITATIONS: cleaning, laundry, driving, community activity, and church  PERSONAL FACTORS Age, Fitness, Transportation, and 3+ comorbidities:    anxiety, arthritis, basal cell carcinoma, back pain, chronic kidney disease, CAD, MI, heart murmur, HTN, lumbar fusion, pacemaker, right THR, mild memory loss, type 2 diabetes mellitus, heart block with pacer, chronic diastolic CHF, OSA with CPAP intolerance, and subtotal colectomy bleeding in 2011 are also affecting patient's functional outcome.   REHAB POTENTIAL: Good  CLINICAL DECISION MAKING: Evolving/moderate complexity  EVALUATION COMPLEXITY: Moderate  PLAN: PT FREQUENCY: 2x/week  PT DURATION: 6 weeks  PLANNED INTERVENTIONS: Therapeutic exercises, Therapeutic activity, Neuromuscular re-education, Balance training, Gait training, Patient/Family education, Self Care, Joint mobilization, Stair training, DME instructions, Cryotherapy, Moist heat, Taping, Manual therapy, and Re-evaluation  PLAN FOR NEXT SESSION: add to HEP for balance/strength/endurance, work on endurance, balance, LE strengthening, SciFit/Nustep  Gary [name] E Sion Reinders, PT, DPT 10/03/2021, 2:43 PM

## 2021-10-04 ENCOUNTER — Ambulatory Visit: Payer: Medicare PPO | Admitting: Podiatry

## 2021-10-08 ENCOUNTER — Ambulatory Visit: Payer: Medicare PPO | Admitting: Physical Therapy

## 2021-10-10 NOTE — Progress Notes (Deleted)
Office Visit    Patient Name: Gary [name] Date of Encounter: 10/10/2021  Primary Care Provider:  Clovia Cuff, MD Primary Cardiologist:  Pixie Casino, MD  Chief Complaint    88 year old male with a history of CAD, CHB s/p Medtronic PPM, syncope, hypertension, hyperlipidemia, CKD stage III, OSA, squamous cell carcinoma, basal cell carcinoma, parkinsonism, arthritis, and GERD who presents for follow-up related to CAD and bilateral lower extremity edema.  Past Medical History    Past Medical History:  Diagnosis Date   Adenomatous colon polyp    2010    Anemia    Anxiety    Arthritis    "hips and lower back"   Arthritis pain    Atypical mole 06/21/2003   left neck, inferior - slight to moderate atypia   Basal cell carcinoma 10/04/2014   right shin bcc (tx cx3, 69f )   Blood transfusion    Chest pain 03/29/2011   2D Echo without contrast on 03/29/11 - EF= 55-60%.   Cholecystitis    Chronic back pain greater than 3 months duration    Chronic kidney disease    Colon polyps    Complication of anesthesia    once slow to wake up   Coronary angioplasty status 2011   Coronary artery disease    Diarrhea    Diverticulosis    Dyslipidemia    GERD (gastroesophageal reflux disease)    GI bleeding after 07/2009   "triggered by Plavix & ASA; from my diverticulosis"   H/O Clostridium difficile infection    H/O myocardial perfusion scan 04/04/2011   For Pre-noncardiac surgery - EF = 67%, no ischemia, and is considered low risk.   Heart attack ([facility] 07/2009   nonSTEMI with an occluded circumflex artery and collaterals.   Heart murmur    Hemorrhoids    History of shingles    Hyperlipidemia    Hypertension    Kidney stones    Lower extremity edema    Taking furosemide and it seems to be helping.   Neuromuscular disorder ([facility]    Nodular infiltrative basal cell carcinoma (BCC) 02/08/2021   Right Forearm-anterior (tx p bx)   Nonspecific chest pain 08/26/2012   Went to ED  08/26/12   Parkinsonism ([facility]    SCCA (squamous cell carcinoma) of skin 02/08/2021   Right Dorsal Hand (well diff) (tx p bx)   Squamous cell carcinoma of skin 05/13/2000   Right outer forehead   Squamous cell carcinoma of skin 03/02/2001   Post crown - tx 03/19/2001   Squamous cell carcinoma of skin 09/01/2001   Left post auricular - tx 10/09/2001   Squamous cell carcinoma of skin 06/21/2003   Top right ear - tx 07/05/2003   Squamous cell carcinoma of skin 06/21/2003   Right upper arm - tx 07/05/2003   Squamous cell carcinoma of skin 10/12/2003   Left superior ear lobe - MOHs   Squamous cell carcinoma of skin 12/15/2006   Right ear, superior - tx 01/26/2007   Squamous cell carcinoma of skin 03/13/2009   Right temple - tx 05/08/2009   Squamous cell carcinoma of skin 03/24/2013   Upper left forearm - tx p bx   Squamous cell carcinoma of skin 12/06/2013   Right forearm - CX3 + 5FU   Squamous cell carcinoma of skin 02/10/2014   Right hand - tx p bx   Squamous cell carcinoma of skin 10/04/2014   Right shin - tx p bx   Squamous cell  carcinoma of skin 08/21/2016   Left hand - tx p bx   Squamous cell carcinoma of skin 01/01/2017   Right hand - tx 02/06/2017   Squamous cell carcinoma of skin 10/21/2017   Left side of scalp - tx 03/12/2018   Squamous cell carcinoma of skin 10/21/2017   Left post scalp, inf - tx 08/13/2018   Squamous cell carcinoma of skin 10/21/2017   Right scalp - tx p bx   Squamous cell carcinoma of skin 10/21/2017   Left cheek - tx 03/12/2018   Squamous cell carcinoma of skin 08/13/2018   Left top hand, proximal pinky - tx p bx   Squamous cell carcinoma of skin 12/23/2018   Right bicep lower - tx p bx   Squamous cell carcinoma of skin 05/18/2019   Right 4th finger metacarpophalangeal joint   Squamous cell carcinoma of skin 05/18/2019   Left 5th finger metacarpophalangeal joint   Squamous cell carcinoma of skin 05/18/2019   Left 2nd finger metacarpophalangeal  joint   Stomach problems    Syncope and collapse 04/16/2018   Past Surgical History:  Procedure Laterality Date   [place]  08/09/2009   Circumflex 100% occluded with right-to-left collaterals, mild irregularities in the proximal and distal LAD and diagonal system, normal LV systolic function, and minor infrarenal irregularities, but no abdominal aortic aneurysm.   CHOLECYSTECTOMY  04/12/2011   Procedure: CHOLECYSTECTOMY;  Surgeon: Adin Hector, MD;  Location: [facility];  Service: General;  Laterality: N/A;   CHOLECYSTECTOMY  04/12/2011   Procedure: LAPAROSCOPIC CHOLECYSTECTOMY;  Surgeon: Adin Hector, MD;  Location: [facility];  Service: General;  Laterality: N/A;  converted to open   COLON SURGERY     COLONOSCOPY   ESOPHAGOGASTRODUODENOSCOPY N/A 08/05/2013   Procedure: ESOPHAGOGASTRODUODENOSCOPY (EGD);  Surgeon: Jerene Bears, MD;  Location: [facility];  Service: Gastroenterology;  Laterality: N/A;   EYE SURGERY  1942   "right eye was crossed; they  corrected it"   LUMBAR FUSION  07/22/2012   PACEMAKER IMPLANT N/A 04/17/2018   Procedure: PACEMAKER IMPLANT;  Surgeon: Deboraha Sprang, MD;  Location: [facility];  Service: Cardiovascular;  Laterality: N/A;   POSTERIOR FUSION LUMBAR SPINE  08/1995   "bottom 4 vertebra"   SUBTOTAL COLECTOMY  01/24/2010   TOTAL HIP ARTHROPLASTY Right 08/03/2013   Procedure: TOTAL HIP ARTHROPLASTY ANTERIOR APPROACH;  Surgeon: Hessie Dibble, MD;  Location: [facility];  Service: Orthopedics;  Laterality: Right;    Allergies  Allergies  Allergen Reactions   Naproxen Rash   Clopidogrel Bisulfate Other (See Comments)    H/O diverticulitis; prone to rectal bleeding.  Plavix complicated this.  jkl   Sulfonamide Derivatives Hives   Other Other (See Comments)    Narcotics-C. Diff   Latex Rash   Tape Itching and Rash    Latex-Adhesive tape    History of Present Illness    88 year old male with the above past medical  history including CAD, CHB s/p Medtronic PPM, syncope, hypertension, hyperlipidemia, CKD stage III, OSA, squamous cell carcinoma, basal cell carcinoma, parkinsonism, arthritis, and GERD.   Echocardiogram 2020 showed EF 60 to 12%, grade 1 diastolic dysfunction, no regional wall abnormality, mild ascending aortic aneurysm measuring 37 mm.  He has a history of CAD s/p NSTEMI in 2011 with occluded left circumflex with collaterals, managed medically.  Most recent myoview in 2021 in the setting of chest discomfort showed evidence of previous MI, no reversible ischemia,  EF 44%.  Repeat echocardiogram in March 2021 showed EF 55 to 60%, mild MR, ascending aorta measuring 42 mm. Additionally, he has a history of complete heart block s/p PPM.  Renal ultrasound in September 2021 showed cortical thinning with increased echogenicity within the parenchyma compatible with medical disease.  Most recent echocardiogram in May 2022 in the setting of shortness of breath showed EF 55 to 60%, no RWMA, indeterminate LV diastolic function, normal RV systolic function, mild to moderate aortic valve sclerosis, without any evidence of aortic valve stenosis, mild dilation of ascending aorta, 41 mm. He called our office on 06/25/2021 and reported increased dyspnea on exertion, bilateral lower extremity edema.  Lasix was increased however, he stated he did not tolerate this well and felt "washed out."  He presented to the ED in June 2023 with intermittent left-sided chest discomfort, lasting for several minutes with associated dizziness and diaphoresis.  He also reported increased anxiety.  EKG was unremarkable.  Chest x-ray was normal.  Troponin was mildly elevated with flat trend.  With low suspicion for ACS, he was discharged home.   He was last seen in the office on 08/07/2021 noted occasional chest notes that occurred slowly in the setting of laying down to sleep at night.  He felt his symptoms were related to anxiety.  Antianginal  therapy.  Given history of CAD, the decision was made to pursue cardiac PET stress test.  However, this has not been completed.  ED on 08/10/2021 with complaints of abdominal pain and was found to have pneumoperitoneum on CT scan.  He was hospitalized from 08/10/2021 to 08/14/2021.  He received IV fluids and IV antibiotics. General surgery is was consulted.   Conservative management was recommended.  Repeat x-ray did not show significant pneumoperitoneum.  He was discharged home in stable condition on 08/14/2021.  He presents today for follow-up.  Since his last visit and since his recent hospitalization   1. Bilateral lower extremity edema: Most recent echo in May 2022 in the setting of shortness of breath showed EF 55 to 60%, no RWMA, indeterminate LV diastolic function, normal RV systolic function, mild to moderate aortic valve sclerosis, without any evidence of aortic valve stenosis, mild dilation of ascending aorta, 41 mm. Euvolemic and well compensated on exam. Continue carvedilol, Lasix 40 mg daily on Monday Wednesday Friday. He may take an additional Lasix 40 mg daily on Tuesday, Thursday, Saturday, and Sunday as needed for weight gain, worsening edema, or shortness of breath.    2. CAD: S/p NSTEMI in 2011 with occluded left circumflex with collaterals, managed medically. Recent episode of chest pain which prompted ED visit. EKG was unremarkable, troponin was minimally elevated with flat trend. Pending cardiac PET stress test. Continue amlodipine, aspirin, carvedilol, Lasix, and simvastatin.    3. CHB: S/p PPM. Most recent PPM interrogation showed normal device function.    4. Hypertension: BP well controlled. Continue current antihypertensive regimen.    5. Hyperlipidemia: No recent LDL on file. Continue simvastatin.   6. Dilation of ascending aorta: CT chest/aorta showed stable 4.0 cm ascending thoracic aortic aneurysm.  BP well-controlled.  Repeat imaging in 1 year.   7. CKD stage III:  Creatinine was 1.66 in 07/2021.  Follows with nephrology.   8. OSA: Not on CPAP as he did not tolerate mask, follows with Dr. Radford Pax.    9. Parkinsonism: Follows with neurology.  As long as he is not having any exertional symptoms concerning for angina, he may continue neuro rehab  PT.   10. Disposition: Follow-up    Home Medications    Current Outpatient Medications  Medication Sig Dispense Refill   acetaminophen (TYLENOL) 500 MG tablet Take 1,000 mg by mouth every 8 (eight) hours as needed for headache (pain).     amLODipine (NORVASC) 5 MG tablet TAKE 1 TABLET BY MOUTH EVERY DAY (Patient taking differently: Take 5 mg by mouth daily after lunch.) 90 tablet 3   aspirin EC 81 MG tablet Take 81 mg by mouth every morning.     blood glucose meter kit and supplies KIT Dispense based on patient and insurance preference. Use up to four times daily as directed. (FOR ICD-9 250.00, 250.01). 1 each 0   carvedilol (COREG) 3.125 MG tablet TAKE 1 TABLET (3.125 MG TOTAL) BY MOUTH 2 (TWO) TIMES DAILY WITH A MEAL. (Patient taking differently: Take 3.125 mg by mouth 2 (two) times daily.) 180 tablet 1   citalopram (CELEXA) 40 MG tablet Take 1 tablet (40 mg total) by mouth daily. (Patient taking differently: Take 40 mg by mouth every morning.) 90 tablet 1   doxazosin (CARDURA) 4 MG tablet Take 4 mg by mouth at bedtime.     fluticasone (FLONASE) 50 MCG/ACT nasal spray Place 2 sprays into both nostrils daily.     furosemide (LASIX) 40 MG tablet Take 1 tablet (40 mg total) by mouth every Monday, Wednesday, and Friday.     nitroGLYCERIN (NITROSTAT) 0.4 MG SL tablet PLACE 1 TABLET UNDER THE TONGUE EVERY 5 MINUTES AS NEEDED FOR CHEST PAIN FOR 3 DOSES (Patient not taking: Reported on 08/11/2021) 25 tablet 0   pregabalin (LYRICA) 75 MG capsule Take 1 capsule (75 mg total) by mouth 3 (three) times daily. 90 capsule 3   simvastatin (ZOCOR) 40 MG tablet TAKE 1 TABLET BY MOUTH AT BEDTIME (Patient taking differently: Take 40 mg  by mouth at bedtime.) 90 tablet 2   traMADol (ULTRAM) 50 MG tablet Take 50 mg by mouth 3 (three) times daily.     vitamin B-12 1000 MCG tablet Take 1 tablet (1,000 mcg total) by mouth daily. (Patient taking differently: Take 1,000 mcg by mouth daily with lunch.) 3 tablet    No current facility-administered medications for this visit.     Review of Systems    ***.  All other systems reviewed and are otherwise negative except as noted above.    Physical Exam    VS:  There were no vitals taken for this visit. , BMI There is no height or weight on file to calculate BMI.     GEN: Well nourished, well developed, in no acute distress. HEENT: normal. Neck: Supple, no JVD, carotid bruits, or masses. Cardiac: RRR, no murmurs, rubs, or gallops. No clubbing, cyanosis, edema.  Radials/DP/PT 2+ and equal bilaterally.  Respiratory:  Respirations regular and unlabored, clear to auscultation bilaterally. GI: Soft, nontender, nondistended, BS + x 4. MS: no deformity or atrophy. Skin: warm and dry, no rash. Neuro:  Strength and sensation are intact. Psych: Normal affect.  Accessory Clinical Findings    ECG personally reviewed by me today - *** - no acute changes.   Lab Results  Component Value Date   WBC 6.5 08/14/2021   HGB 13.3 08/14/2021   HCT 39.0 08/14/2021   MCV 89.0 08/14/2021   PLT 141 (L) 08/14/2021   Lab Results  Component Value Date   CREATININE 1.72 (H) 08/14/2021   BUN 24 (H) 08/14/2021   NA 138 08/14/2021   K 3.5 08/14/2021  CL 105 08/14/2021   CO2 20 (L) 08/14/2021   Lab Results  Component Value Date   ALT 15 08/12/2021   AST 24 08/12/2021   ALKPHOS 28 (L) 08/12/2021   BILITOT 1.3 (H) 08/12/2021   Lab Results  Component Value Date   CHOL  08/09/2009    164        ATP III CLASSIFICATION:  <200     mg/dL   Desirable  200-239  mg/dL   Borderline High  >=240    mg/dL   High          HDL 36 (L) 08/09/2009   LDLCALC (H) 08/09/2009    105        Total  Cholesterol/HDL:CHD Risk Coronary Heart Disease Risk Table                     Men   Women  1/2 Average Risk   3.4   3.3  Average Risk       5.0   4.4  2 X Average Risk   9.6   7.1  3 X Average Risk  23.4   11.0        Use the calculated Patient Ratio above and the CHD Risk Table to determine the patient's CHD Risk.        ATP III CLASSIFICATION (LDL):  <100     mg/dL   Optimal  100-129  mg/dL   Near or Above                    Optimal  130-159  mg/dL   Borderline  160-189  mg/dL   High  >190     mg/dL   Very High   TRIG 114 08/09/2009   CHOLHDL 4.6 08/09/2009    Lab Results  Component Value Date   HGBA1C 7.0 (H) 07/14/2019    Assessment & Plan    1.  ***  No BP recorded.  {Refresh Note OR Click here to enter BP  :1}***   Lenna Sciara, NP 10/10/2021, 8:46 PM

## 2021-10-11 ENCOUNTER — Encounter: Payer: Self-pay | Admitting: Physical Therapy

## 2021-10-11 ENCOUNTER — Ambulatory Visit: Payer: Medicare PPO | Attending: Nurse Practitioner | Admitting: Nurse Practitioner

## 2021-10-11 ENCOUNTER — Ambulatory Visit: Payer: Medicare PPO | Admitting: Physical Therapy

## 2021-10-11 DIAGNOSIS — I2510 Atherosclerotic heart disease of native coronary artery without angina pectoris: Secondary | ICD-10-CM

## 2021-10-11 DIAGNOSIS — G4733 Obstructive sleep apnea (adult) (pediatric): Secondary | ICD-10-CM

## 2021-10-11 DIAGNOSIS — G20 Parkinson's disease: Secondary | ICD-10-CM

## 2021-10-11 DIAGNOSIS — R2681 Unsteadiness on feet: Secondary | ICD-10-CM

## 2021-10-11 DIAGNOSIS — R293 Abnormal posture: Secondary | ICD-10-CM

## 2021-10-11 DIAGNOSIS — M6281 Muscle weakness (generalized): Secondary | ICD-10-CM

## 2021-10-11 DIAGNOSIS — N1832 Chronic kidney disease, stage 3b: Secondary | ICD-10-CM

## 2021-10-11 DIAGNOSIS — Z950 Presence of cardiac pacemaker: Secondary | ICD-10-CM

## 2021-10-11 DIAGNOSIS — R2689 Other abnormalities of gait and mobility: Secondary | ICD-10-CM

## 2021-10-11 DIAGNOSIS — I10 Essential (primary) hypertension: Secondary | ICD-10-CM

## 2021-10-11 DIAGNOSIS — I77810 Thoracic aortic ectasia: Secondary | ICD-10-CM

## 2021-10-11 DIAGNOSIS — I442 Atrioventricular block, complete: Secondary | ICD-10-CM

## 2021-10-11 DIAGNOSIS — E785 Hyperlipidemia, unspecified: Secondary | ICD-10-CM

## 2021-10-11 DIAGNOSIS — R600 Localized edema: Secondary | ICD-10-CM

## 2021-10-11 NOTE — Therapy (Signed)
OUTPATIENT PHYSICAL THERAPY NEURO TREATMENT   Patient Name: Gary [name] MRN: [id number] DOB:[birth year], 88 y.o., male Today's Date: 10/11/2021   PCP: Clovia Cuff, MD REFERRING PROVIDER: Penni Bombard, MD    PT End of Session - 10/11/21 1321     Visit Number 4    Number of Visits 13   plus eval   Date for PT Re-Evaluation 11/06/21    Authorization Type [employer]    Progress Note Due on Visit 10    PT Start Time 1317    PT Stop Time 1400    PT Time Calculation (min) 43 min    Equipment Utilized During Treatment Gait belt    Activity Tolerance Patient tolerated treatment well    Behavior During Therapy WFL for tasks assessed/performed               Past Medical History:  Diagnosis Date   Adenomatous colon polyp    2010    Anemia    Anxiety    Arthritis    "hips and lower back"   Arthritis pain    Atypical mole 06/21/2003   left neck, inferior - slight to moderate atypia   Basal cell carcinoma 10/04/2014   right shin bcc (tx cx3, 69f )   Blood transfusion    Chest pain 03/29/2011   2D Echo without contrast on 03/29/11 - EF= 55-60%.   Cholecystitis    Chronic back pain greater than 3 months duration    Chronic kidney disease    Colon polyps    Complication of anesthesia    once slow to wake up   Coronary angioplasty status 2011   Coronary artery disease    Diarrhea    Diverticulosis    Dyslipidemia    GERD (gastroesophageal reflux disease)    GI bleeding after 07/2009   "triggered by Plavix & ASA; from my diverticulosis"   H/O Clostridium difficile infection    H/O myocardial perfusion scan 04/04/2011   For Pre-noncardiac surgery - EF = 67%, no ischemia, and is considered low risk.   Heart attack ([place] 07/[place]   nonSTEMI with an occluded circumflex artery and collaterals.   Heart murmur    Hemorrhoids    History of shingles    Hyperlipidemia    Hypertension    Kidney stones    Lower extremity edema    Taking furosemide and it seems  to be helping.   [place]    Nodular infiltrative basal cell carcinoma (BCC) 02/08/2021   Right Forearm-anterior (tx p bx)   Nonspecific chest pain 08/26/2012   Went to ED 08/26/12   Parkinsonism ([facility]    SCCA (squamous cell carcinoma) of skin 02/08/2021   Right Dorsal Hand (well diff) (tx p bx)   Squamous cell carcinoma of skin 05/13/2000   Right outer forehead   Squamous cell carcinoma of skin 03/02/2001   Post crown - tx 03/19/2001   Squamous cell carcinoma of skin 09/01/2001   Left post auricular - tx 10/09/2001   Squamous cell carcinoma of skin 06/21/2003   Top right ear - tx 07/05/2003   Squamous cell carcinoma of skin 06/21/2003   Right upper arm - tx 07/05/2003   Squamous cell carcinoma of skin 10/12/2003   Left superior ear lobe - MOHs   Squamous cell carcinoma of skin 12/15/2006   Right ear, superior - tx 01/26/2007   Squamous cell carcinoma of skin 03/13/2009   Right temple - tx 05/08/2009   Squamous  cell carcinoma of skin 03/24/2013   Upper left forearm - tx p bx   Squamous cell carcinoma of skin 12/06/2013   Right forearm - CX3 + 5FU   Squamous cell carcinoma of skin 02/10/2014   Right hand - tx p bx   Squamous cell carcinoma of skin 10/04/2014   Right shin - tx p bx   Squamous cell carcinoma of skin 08/21/2016   Left hand - tx p bx   Squamous cell carcinoma of skin 01/01/2017   Right hand - tx 02/06/2017   Squamous cell carcinoma of skin 10/21/2017   Left side of scalp - tx 03/12/2018   Squamous cell carcinoma of skin 10/21/2017   Left post scalp, inf - tx 08/13/2018   Squamous cell carcinoma of skin 10/21/2017   Right scalp - tx p bx   Squamous cell carcinoma of skin 10/21/2017   Left cheek - tx 03/12/2018   Squamous cell carcinoma of skin 08/13/2018   Left top hand, proximal pinky - tx p bx   Squamous cell carcinoma of skin 12/23/2018   Right bicep lower - tx p bx   Squamous cell carcinoma of skin 05/18/2019   Right 4th finger  metacarpophalangeal joint   Squamous cell carcinoma of skin 05/18/2019   Left 5th finger metacarpophalangeal joint   Squamous cell carcinoma of skin 05/18/2019   Left 2nd finger metacarpophalangeal joint   Stomach problems    Syncope and collapse 04/16/2018   Past Surgical History:  Procedure Laterality Date   BACK SURGERY  1997   CARDIAC CATHETERIZATION  08/09/2009   Circumflex 100% occluded with right-to-left collaterals, mild irregularities in the proximal and distal LAD and diagonal system, normal LV systolic function, and minor infrarenal irregularities, but no abdominal aortic aneurysm.   CHOLECYSTECTOMY  04/12/2011   Procedure: CHOLECYSTECTOMY;  Surgeon: Haywood M Ingram, MD;  Location: [facility] OR;  Service: General;  Laterality: N/A;   CHOLECYSTECTOMY  04/12/2011   Procedure: LAPAROSCOPIC CHOLECYSTECTOMY;  Surgeon: Haywood M Ingram, MD;  Location: [facility] OR;  Service: General;  Laterality: N/A;  converted to open   COLON SURGERY     COLONOSCOPY   ESOPHAGOGASTRODUODENOSCOPY N/A 08/05/2013   Procedure: ESOPHAGOGASTRODUODENOSCOPY (EGD);  Surgeon: Jay M Pyrtle, MD;  Location: [facility] ENDOS[facility];  Service: Gastroenterology;  Laterality: N/A;   EYE SURGERY  1942   "right eye was crossed; they  corrected it"   LUMBAR FUSION  07/22/2012   PACEMAKER IMPLANT N/A 04/17/2018   Procedure: PACEMAKER IMPLANT;  Surgeon: Klein, Steven C, MD;  Location: [facility];  Service: Cardiovascular;  Laterality: N/A;   POSTERIOR FUSION LUMBAR SPINE  08/1995   "bottom 4 vertebra"   SUBTOTAL COLECTOMY  01/24/2010   TOTAL HIP ARTHROPLASTY Right 08/03/2013   Procedure: TOTAL HIP ARTHROPLASTY ANTERIOR APPROACH;  Surgeon: Peter G Dalldorf, MD;  Location: [facility] OR;  Service: Orthopedics;  Laterality: Right;   Patient Active Problem List   Diagnosis Date Noted   Constipation 08/12/2021   Perforated bowel (HCC) 08/11/2021   Thrombocytopenia (HCC) 08/11/2021   Mood disorder (HCC) 08/11/2021   Chronic back pain greater than 3  months duration    Pneumoperitoneum    Vitamin B12 deficiency 04/15/2019   Abnormal glucose 04/15/2019   Stage 4 chronic kidney disease (HCC) 04/15/2019   Dyspnea on exertion 04/15/2019   Pulmonary hypertension (HCC) 04/08/2019   Chest pain 03/27/2019   Complete heart block (HCC) 04/17/2018   Syncope and collapse 04/16/2018   Sinus pause 04/16/2018     Snoring 10/03/2017   Other fatigue 10/03/2017   Pre-syncope 05/14/2016   Murmur 05/14/2016   CAD in native artery 10/31/2015   History of MI (myocardial infarction) 10/31/2015   Preoperative cardiovascular examination 12/26/2014   History of colonic polyps 01/17/2014   PAF (paroxysmal atrial fibrillation) (HCC) 08/11/1599   Metabolic acidosis 09/32/3557   Acute renal failure ([place]) 08/13/2013   Stage 3b chronic kidney disease ([place]) 08/13/2013   Anemia in chronic kidney disease 08/13/2013   Hypokalemia 08/12/2013   Acute blood loss anemia 08/09/2013   Encephalopathy 08/09/2013   Hypotension, unspecified 08/07/2013   Complex renal cyst 08/07/2013   Renal mass, left 08/06/2013   Acute esophagitis 08/05/2013   Urinary retention 08/04/2013   S/P total hip arthroplasty 08/03/2013   Degenerative joint disease (DJD) of hip 08/03/2013    Class: Chronic   Internal hemorrhoids with other complication 32/20/2542   Tinnitus 10/22/2012   C. difficile colitis 07/26/2012   Ileus, postoperative ([place]) 07/24/2012   Somnolence 07/24/2012   Anxiety state 07/24/2012   Chest pain- ER 08/26/12 03/30/2011   Fever 03/28/2011   Leucocytosis 03/28/2011   CKD (chronic kidney disease), stage III ([place]) 03/28/2011   Hyponatremia 02/02/2011   BPH (benign prostatic hyperplasia) 02/01/2011   Cholecystitis chronic, acute 01/26/2011   Dyslipidemia 01/22/2011   Abdominal bloating 08/07/2010   Loose stools 08/07/2010   Coronary atherosclerosis 10/26/2009   Essential hypertension 06/07/2009   DIVERTICULOSIS OF COLON 10/31/2008   DIVERTICULOSIS, COLON, WITH  HEMORRHAGE, surg 12/11  10/31/2008   Personal history of colonic polyps 10/31/2008    ONSET DATE: 09/19/2021   REFERRING DIAG: R26.9 (ICD-10-CM) - Abnormality of gait and mobility   THERAPY DIAG:  Unsteadiness on feet  Muscle weakness (generalized)  Other abnormalities of gait and mobility  Abnormal posture  Rationale for Evaluation and Treatment Rehabilitation  PERTINENT HISTORY: anxiety, arthritis, basal cell carcinoma, back pain, chronic kidney disease, CAD, MI, heart murmur, HTN, lumbar fusion, pacemaker, right THR, mild memory loss, type 2 diabetes mellitus, heart block with pacer, chronic diastolic CHF, OSA with CPAP intolerance, and subtotal colectomy bleeding in 2011    PRECAUTIONS: Fall and ICD/Pacemaker   PATIENT GOALS: "I want to get to where I can be confident with a cane", "move better"    SUBJECTIVE:  No falls. Does report pulling his back when trying to reach up for something in his closet.                                                                                                                                                                                         Pt accompanied by: self  PAIN:  Are you having pain? Yes: NPRS scale: 3/10 Pain location: low back Pain description: achy/sharp  VITALS  There were no vitals filed for this visit.      TODAY'S TREATMENT: STRENGTHENING  Scifit UE/LE's level 2.3 x 8 minutes with goal >/= 90 steps per minute for strengthening, reciprocal movement pattern and aerobic endurance.     BALANCE/NMR: With airex in parallel bars with UE support: CGA for safety. - alternating forward step up/backward step downs floor<>airex for 8 reps each side, cues for incr step length/height.  - with feet hip width apart: heel<>toe raises, alternating slow marching and mini squats x 15 reps each bil LE's.   With 2 tall cones on floor: - alternating forward foot taps for 10 reps each side with no issues. - alternating  cross foot taps for 10 reps each side. Pt reported slight increase in low back pain afterwards that resolved with seated rest break.     GAIT: Gait pattern: step through pattern, decreased step length- Right, decreased step length- Left, decreased stride length, and trunk flexed Distance walked: 115 x 1, plus around clinic with session Assistive device utilized: Walker - 2 wheeled Level of assistance: SBA Comments: cues for posture and walker position     PATIENT EDUCATION: Education details: continue HEP Person educated: Patient Education method: Theatre stage manager Education comprehension: verbalized understanding   HOME EXERCISE PROGRAM: Access Code: [email or web address] URL: [email or web address] Date: 10/01/2021 Prepared by: Excell Seltzer  Exercises - Sit to Stand with Hands on Knees  - 1 x daily - 7 x weekly - 3 sets - 10 reps - Side Stepping with Counter Support  - 1 x daily - 7 x weekly - 1 sets - 10 reps - Standing March with Counter Support  - 1 x daily - 7 x weekly - 3 sets - 10 reps  Walking Program: Walk with your family. Walk during cooler times of the day. Starting next week, walk 5 min per day for 7 days. Following week add 5 minutes to your total time. Week 1: 10 min Week 2: 15 min Week 3: 20 min Week 4: 25 min...Marland KitchenMarland KitchenUntil you can walk to 30 min per day    GOALS: Goals reviewed with patient? Yes  SHORT TERM GOALS: Target date: 10/16/2021  Initiate HEP Baseline: not established at eval Goal status: INITIAL  2.  Pt will improve 5 x STS to less than or equal to 20 seconds to demonstrate improved functional strength and transfer efficiency.  Baseline: 25.28 sec (8/15) Goal status: INITIAL  3.  Pt will improve normal TUG to less than or equal to 30 seconds for improved functional mobility and decreased fall risk. Baseline: 35.78 sec (8/15) Goal status: INITIAL  4.  Pt will improve gait velocity to at least 1.5 ft/sec for improved gait  efficiency and performance at mod I level Baseline: 1.09 ft/sec Goal status: INITIAL  5.  Pt will improve score on 6MWT to 450 ft with LRAD with no seated rest breaks to demonstrate improved endurance Baseline: 334 ft with RW with one seated rest break (8/21) Goal status: INITIAL   LONG TERM GOALS: Target date: 11/06/2021  Pt will be independent with final HEP for improved strength, balance, transfers and gait. Baseline: not established at eval Goal status: INITIAL  2.  Pt will improve 5 x STS to less than or equal to 15 seconds to demonstrate improved functional strength and transfer efficiency.  Baseline: 25.28 sec (8/15) Goal status: INITIAL  3.  Pt  will improve normal TUG to less than or equal to 25 seconds for improved functional mobility and decreased fall risk. Baseline: 35.78 sec (8/15) Goal status: INITIAL  4.  Pt will improve gait velocity to at least 2.0 ft/sec for improved gait efficiency and performance at mod I level  Baseline: 1.09 ft/sec Goal status: INITIAL  5.  Pt will improve score on 6MWT to 500 ft with LRAD with no seated rest break to demonstrate improved endurance Baseline: 334 ft with RW with one seated rest break (8/21) Goal status: INITIAL   ASSESSMENT:  CLINICAL IMPRESSION: Today's skilled session continued to focus on strengthening, activity tolerance and balance training with rest breaks taken as needed to allow pain level to recover to baseline. No other issues noted or reported. The pt is making progress and should benefit from continued PT to progress toward unmet goals.    OBJECTIVE IMPAIRMENTS Abnormal gait, decreased activity tolerance, decreased balance, decreased cognition, decreased endurance, decreased mobility, difficulty walking, decreased strength, decreased safety awareness, impaired sensation, and postural dysfunction.   ACTIVITY LIMITATIONS carrying, lifting, bending, standing, squatting, and stairs  PARTICIPATION LIMITATIONS:  cleaning, laundry, driving, community activity, and church  PERSONAL FACTORS Age, Fitness, Transportation, and 3+ comorbidities:    anxiety, arthritis, basal cell carcinoma, back pain, chronic kidney disease, CAD, MI, heart murmur, HTN, lumbar fusion, pacemaker, right THR, mild memory loss, type 2 diabetes mellitus, heart block with pacer, chronic diastolic CHF, OSA with CPAP intolerance, and subtotal colectomy bleeding in 2011 are also affecting patient's functional outcome.   REHAB POTENTIAL: Good  CLINICAL DECISION MAKING: Evolving/moderate complexity  EVALUATION COMPLEXITY: Moderate  PLAN: PT FREQUENCY: 2x/week  PT DURATION: 6 weeks  PLANNED INTERVENTIONS: Therapeutic exercises, Therapeutic activity, Neuromuscular re-education, Balance training, Gait training, Patient/Family education, Self Care, Joint mobilization, Stair training, DME instructions, Cryotherapy, Moist heat, Taping, Manual therapy, and Re-evaluation  PLAN FOR NEXT SESSION:  add to HEP for balance/strength/endurance, work on endurance, balance, LE strengthening, SciFit/Nustep     Willow Ora, PTA, [place] [address], [place], [place] [id number] [phone] 10/11/21, 10:21 PM

## 2021-10-16 ENCOUNTER — Ambulatory Visit: Payer: Medicare PPO | Attending: Internal Medicine | Admitting: Physical Therapy

## 2021-10-16 ENCOUNTER — Encounter: Payer: Self-pay | Admitting: Physical Therapy

## 2021-10-16 DIAGNOSIS — R2689 Other abnormalities of gait and mobility: Secondary | ICD-10-CM | POA: Diagnosis present

## 2021-10-16 DIAGNOSIS — M25552 Pain in left hip: Secondary | ICD-10-CM | POA: Diagnosis present

## 2021-10-16 DIAGNOSIS — M67959 Unspecified disorder of synovium and tendon, unspecified thigh: Secondary | ICD-10-CM | POA: Insufficient documentation

## 2021-10-16 DIAGNOSIS — G8929 Other chronic pain: Secondary | ICD-10-CM | POA: Diagnosis present

## 2021-10-16 DIAGNOSIS — R2681 Unsteadiness on feet: Secondary | ICD-10-CM | POA: Insufficient documentation

## 2021-10-16 DIAGNOSIS — M6281 Muscle weakness (generalized): Secondary | ICD-10-CM | POA: Diagnosis present

## 2021-10-16 DIAGNOSIS — M545 Low back pain, unspecified: Secondary | ICD-10-CM | POA: Diagnosis present

## 2021-10-16 DIAGNOSIS — R293 Abnormal posture: Secondary | ICD-10-CM | POA: Diagnosis present

## 2021-10-16 NOTE — Therapy (Signed)
OUTPATIENT PHYSICAL THERAPY NEURO TREATMENT   Patient Name: Gary [name] MRN: [id number] DOB:08-27-1932, 88 y.o., male Today's Date: 10/16/2021   PCP: Clovia Cuff, MD REFERRING PROVIDER: Penni Bombard, MD    PT End of Session - 10/16/21 1408     Visit Number 5    Number of Visits 13   plus eval   Date for PT Re-Evaluation 11/06/21    Authorization Type [employer]    Progress Note Due on Visit 10    PT Start Time 1405    PT Stop Time 1443    PT Time Calculation (min) 38 min    Equipment Utilized During Treatment Gait belt    Activity Tolerance Patient tolerated treatment well    Behavior During Therapy WFL for tasks assessed/performed               Past Medical History:  Diagnosis Date   Adenomatous colon polyp    2010    Anemia    Anxiety    Arthritis    "hips and lower back"   Arthritis pain    Atypical mole 06/21/2003   left neck, inferior - slight to moderate atypia   Basal cell carcinoma 10/04/2014   right shin bcc (tx cx3, 31f )   Blood transfusion    Chest pain 03/29/2011   2D Echo without contrast on 03/29/11 - EF= 55-60%.   Cholecystitis    Chronic back pain greater than 3 months duration    Chronic kidney disease    Colon polyps    Complication of anesthesia    once slow to wake up   Coronary angioplasty status 2011   Coronary artery disease    Diarrhea    Diverticulosis    Dyslipidemia    GERD (gastroesophageal reflux disease)    GI bleeding after 07/2009   "triggered by Plavix & ASA; from my diverticulosis"   H/O Clostridium difficile infection    H/O myocardial perfusion scan 04/04/2011   For Pre-noncardiac surgery - EF = 67%, no ischemia, and is considered low risk.   [place] 07[place]   nonSTEMI with an occluded circumflex artery and collaterals.   Heart murmur    Hemorrhoids    History of shingles    Hyperlipidemia    Hypertension    Kidney stones    Lower extremity edema    Taking furosemide and it seems  to be helping.   Neuromuscular disorder (HTowner    Nodular infiltrative basal cell carcinoma (BCC) 02/08/2021   Right Forearm-anterior (tx p bx)   Nonspecific chest pain 08/26/2012   Went to ED 08/26/12   Parkinsonism ([place]    SCCA (squamous cell carcinoma) of skin 02/08/2021   Right Dorsal Hand (well diff) (tx p bx)   Squamous cell carcinoma of skin 05/13/2000   Right outer forehead   Squamous cell carcinoma of skin 03/02/2001   Post crown - tx 03/19/2001   Squamous cell carcinoma of skin 09/01/2001   Left post auricular - tx 10/09/2001   Squamous cell carcinoma of skin 06/21/2003   Top right ear - tx 07/05/2003   Squamous cell carcinoma of skin 06/21/2003   Right upper arm - tx 07/05/2003   Squamous cell carcinoma of skin 10/12/2003   Left superior ear lobe - MOHs   Squamous cell carcinoma of skin 12/15/2006   Right ear, superior - tx 01/26/2007   Squamous cell carcinoma of skin 03/13/2009   Right temple - tx 05/08/2009   Squamous  cell carcinoma of skin 03/24/2013   Upper left forearm - tx p bx   Squamous cell carcinoma of skin 12/06/2013   Right forearm - CX3 + 5FU   Squamous cell carcinoma of skin 02/10/2014   Right hand - tx p bx   Squamous cell carcinoma of skin 10/04/2014   Right shin - tx p bx   Squamous cell carcinoma of skin 08/21/2016   Left hand - tx p bx   Squamous cell carcinoma of skin 01/01/2017   Right hand - tx 02/06/2017   Squamous cell carcinoma of skin 10/21/2017   Left side of scalp - tx 03/12/2018   Squamous cell carcinoma of skin 10/21/2017   Left post scalp, inf - tx 08/13/2018   Squamous cell carcinoma of skin 10/21/2017   Right scalp - tx p bx   Squamous cell carcinoma of skin 10/21/2017   Left cheek - tx 03/12/2018   Squamous cell carcinoma of skin 08/13/2018   Left top hand, proximal pinky - tx p bx   Squamous cell carcinoma of skin 12/23/2018   Right bicep lower - tx p bx   Squamous cell carcinoma of skin 05/18/2019   Right 4th finger  metacarpophalangeal joint   Squamous cell carcinoma of skin 05/18/2019   Left 5th finger metacarpophalangeal joint   Squamous cell carcinoma of skin 05/18/2019   Left 2nd finger metacarpophalangeal joint   Stomach problems    Syncope and collapse 04/16/2018   Past Surgical History:  Procedure Laterality Date   BACK SURGERY  1997   CARDIAC CATHETERIZATION  08/09/2009   Circumflex 100% occluded with right-to-left collaterals, mild irregularities in the proximal and distal LAD and diagonal system, normal LV systolic function, and minor infrarenal irregularities, but no abdominal aortic aneurysm.   CHOLECYSTECTOMY  04/12/2011   Procedure: CHOLECYSTECTOMY;  Surgeon: Haywood M Ingram, MD;  Location: [facility] OR;  Service: General;  Laterality: N/A;   CHOLECYSTECTOMY  04/12/2011   Procedure: LAPAROSCOPIC CHOLECYSTECTOMY;  Surgeon: Haywood M Ingram, MD;  Location: [facility] OR;  Service: General;  Laterality: N/A;  converted to open   COLON SURGERY     COLONOSCOPY   ESOPHAGOGASTRODUODENOSCOPY N/A 08/05/2013   Procedure: ESOPHAGOGASTRODUODENOSCOPY (EGD);  Surgeon: Jay M Pyrtle, MD;  Location: [facility];  Service: Gastroenterology;  Laterality: N/A;   EYE SURGERY  1942   "right eye was crossed; they  corrected it"   LUMBAR FUSION  07/22/2012   PACEMAKER IMPLANT N/A 04/17/2018   Procedure: PACEMAKER IMPLANT;  Surgeon: Klein, Steven C, MD;  Location: [facility];  Service: Cardiovascular;  Laterality: N/A;   POSTERIOR FUSION LUMBAR SPINE  08/1995   "bottom 4 vertebra"   SUBTOTAL COLECTOMY  01/24/2010   TOTAL HIP ARTHROPLASTY Right 08/03/2013   Procedure: TOTAL HIP ARTHROPLASTY ANTERIOR APPROACH;  Surgeon: Peter G Dalldorf, MD;  Location: [facility] OR;  Service: Orthopedics;  Laterality: Right;   Patient Active Problem List   Diagnosis Date Noted   Constipation 08/12/2021   Perforated bowel (HCC) 08/11/2021   Thrombocytopenia (HCC) 08/11/2021   Mood disorder (HCC) 08/11/2021   Chronic back pain greater than 3  months duration    Pneumoperitoneum    Vitamin B12 deficiency 04/15/2019   Abnormal glucose 04/15/2019   Stage 4 chronic kidney disease (HCC) 04/15/2019   Dyspnea on exertion 04/15/2019   Pulmonary hypertension (HCC) 04/08/2019   Chest pain 03/27/2019   Complete heart block (HCC) 04/17/2018   Syncope and collapse 04/16/2018   Sinus pause 04/16/2018     Snoring 10/03/2017   Other fatigue 10/03/2017   Pre-syncope 05/14/2016   Murmur 05/14/2016   CAD in native artery 10/31/2015   History of MI (myocardial infarction) 10/31/2015   Preoperative cardiovascular examination 12/26/2014   History of colonic polyps 01/17/2014   PAF (paroxysmal atrial fibrillation) (HCC) 08/14/2013   Metabolic acidosis 08/14/2013   Acute renal failure (HCC) 08/13/2013   Stage 3b chronic kidney disease (HCC) 08/13/2013   Anemia in chronic kidney disease 08/13/2013   Hypokalemia 08/12/2013   Acute blood loss anemia 08/09/2013   Encephalopathy 08/09/2013   Hypotension, unspecified 08/07/2013   Complex renal cyst 08/07/2013   Renal mass, left 08/06/2013   Acute esophagitis 08/05/2013   Urinary retention 08/04/2013   S/P total hip arthroplasty 08/03/2013   Degenerative joint disease (DJD) of hip 08/03/2013    Class: Chronic   Internal hemorrhoids with other complication 05/12/2013   Tinnitus 10/22/2012   C. difficile colitis 07/26/2012   Ileus, postoperative (HCC) 07/24/2012   Somnolence 07/24/2012   Anxiety state 07/24/2012   Chest pain- ER 08/26/12 03/30/2011   Fever 03/28/2011   Leucocytosis 03/28/2011   CKD (chronic kidney disease), stage III (HCC) 03/28/2011   Hyponatremia 02/02/2011   BPH (benign prostatic hyperplasia) 02/01/2011   Cholecystitis chronic, acute 01/26/2011   Dyslipidemia 01/22/2011   Abdominal bloating 08/07/2010   Loose stools 08/07/2010   Coronary atherosclerosis 10/26/2009   Essential hypertension 06/07/2009   DIVERTICULOSIS OF COLON 10/31/2008   DIVERTICULOSIS, COLON, WITH  HEMORRHAGE, surg 12/11  10/31/2008   Personal history of colonic polyps 10/31/2008    ONSET DATE: 09/19/2021   REFERRING DIAG: R26.9 (ICD-10-CM) - Abnormality of gait and mobility   THERAPY DIAG:  Unsteadiness on feet  Muscle weakness (generalized)  Other abnormalities of gait and mobility  Abnormal posture  Rationale for Evaluation and Treatment Rehabilitation  PERTINENT HISTORY: anxiety, arthritis, basal cell carcinoma, back pain, chronic kidney disease, CAD, MI, heart murmur, HTN, lumbar fusion, pacemaker, right THR, mild memory loss, type 2 diabetes mellitus, heart block with pacer, chronic diastolic CHF, OSA with CPAP intolerance, and subtotal colectomy bleeding in 2011    PRECAUTIONS: Fall and ICD/Pacemaker   PATIENT GOALS: "I want to get to where I can be confident with a cane", "move better"    SUBJECTIVE:  No new complaints. No falls. No change in back pain.                                                                                                                                                                                     Pt accompanied by: significant other in lobby.   PAIN:  Are you having pain? Yes: NPRS scale:   3/10 Pain location: low back Pain description: achy/sharp  VITALS see 6 minute walk test below     TODAY'S TREATMENT: STRENGTHENING  Discussed current HEP. Pt is doing the ex's at home and still finds them challenging.    [facility] PT Assessment - 10/16/21 1411       6 Minute Walk- Baseline   6 Minute Walk- Baseline yes    BP (mmHg) 140/71    HR (bpm) 56    02 Sat (%RA) 96 %    Modified Borg Scale for Dyspnea 0- Nothing at all    Perceived Rate of Exertion (Borg) 6-      6 Minute walk- Post Test   6 Minute Walk Post Test yes    BP (mmHg) 170/85    HR (bpm) 98    02 Sat (%RA) 96 %    Modified Borg Scale for Dyspnea 2- Mild shortness of breath    Perceived Rate of Exertion (Borg) 13- Somewhat hard      6 minute walk test  results    Aerobic Endurance Distance Walked 514    Endurance additional comments with RW, no rest breaks needed. increased trunk flexion as distance progressed.      Standardized Balance Assessment   Standardized Balance Assessment Timed Up and Go Test;Five Times Sit to Stand    Five times sit to stand comments  18.78 sec's from standard height chair with UE assist      Timed Up and Go Test   TUG Normal TUG    Normal TUG (seconds) 23.81   with RW                 PATIENT EDUCATION: Education details: continue HEP; progress toward goals checked today Person educated: Patient Education method: Explanation and Handouts Education comprehension: verbalized understanding   HOME EXERCISE PROGRAM: Access Code: [email or web address] URL: [email or web address] Date: 10/01/2021 Prepared by: Taylor Turkalo  Exercises - Sit to Stand with Hands on Knees  - 1 x daily - 7 x weekly - 3 sets - 10 reps - Side Stepping with Counter Support  - 1 x daily - 7 x weekly - 1 sets - 10 reps - Standing March with Counter Support  - 1 x daily - 7 x weekly - 3 sets - 10 reps  Walking Program: Walk with your family. Walk during cooler times of the day. Starting next week, walk 5 min per day for 7 days. Following week add 5 minutes to your total time. Week 1: 10 min Week 2: 15 min Week 3: 20 min Week 4: 25 min.....Until you can walk to 30 min per day    GOALS: Goals reviewed with patient? Yes  SHORT TERM GOALS: Target date: 10/16/2021  Initiate HEP Baseline: 10/16/21: met with current program Goal status: MET  2.  Pt will improve 5 x STS to less than or equal to 20 seconds to demonstrate improved functional strength and transfer efficiency.  Baseline: 10/16/21: 18.78 sec's  Goal status: MET  3.  Pt will improve normal TUG to less than or equal to 30 seconds for improved functional mobility and decreased fall risk. Baseline:  10/16/21: 23.81 with RW Goal status: MET  4.  Pt will improve  gait velocity to at least 1.5 ft/sec for improved gait efficiency and performance at mod I level Baseline: 1.09 ft/sec Goal status: INITIAL  5.  Pt will improve score on 6MWT to 450 ft with LRAD with no seated rest breaks to demonstrate improved endurance   Baseline:10/16/21: 514 feet with RW with no rest breaks  Goal status: MET   LONG TERM GOALS: Target date: 11/06/2021  Pt will be independent with final HEP for improved strength, balance, transfers and gait. Baseline: not established at eval Goal status: INITIAL  2.  Pt will improve 5 x STS to less than or equal to 15 seconds to demonstrate improved functional strength and transfer efficiency.  Baseline: 25.28 sec (8/15) Goal status: INITIAL  3.  Pt will improve normal TUG to less than or equal to 25 seconds for improved functional mobility and decreased fall risk. Baseline: 35.78 sec (8/15) Goal status: INITIAL  4.  Pt will improve gait velocity to at least 2.0 ft/sec for improved gait efficiency and performance at mod I level  Baseline: 1.09 ft/sec Goal status: INITIAL  5.  Pt will improve score on 6MWT to 500 ft with LRAD with no seated rest break to demonstrate improved endurance Baseline: 334 ft with RW with one seated rest break (8/21) Goal status: INITIAL   ASSESSMENT:  CLINICAL IMPRESSION: Today's skilled session focused on progress toward STGs with pt meeting all goals checked today. Pt improved his 5 time sit to stand to 18.78 sec's and the TUG to 23.81 sec's with RW. His 6 minute walk test also improved to 514 feet with RW with no rest breaks needed. Will need to check remaining STG at next session. The pt is making steady progress toward goals and should benefit from continued PT to progress toward unmet goals.    OBJECTIVE IMPAIRMENTS Abnormal gait, decreased activity tolerance, decreased balance, decreased cognition, decreased endurance, decreased mobility, difficulty walking, decreased strength, decreased safety  awareness, impaired sensation, and postural dysfunction.   ACTIVITY LIMITATIONS carrying, lifting, bending, standing, squatting, and stairs  PARTICIPATION LIMITATIONS: cleaning, laundry, driving, community activity, and church  PERSONAL FACTORS Age, Fitness, Transportation, and 3+ comorbidities:    anxiety, arthritis, basal cell carcinoma, back pain, chronic kidney disease, CAD, MI, heart murmur, HTN, lumbar fusion, pacemaker, right THR, mild memory loss, type 2 diabetes mellitus, heart block with pacer, chronic diastolic CHF, OSA with CPAP intolerance, and subtotal colectomy bleeding in 2011 are also affecting patient's functional outcome.   REHAB POTENTIAL: Good  CLINICAL DECISION MAKING: Evolving/moderate complexity  EVALUATION COMPLEXITY: Moderate  PLAN: PT FREQUENCY: 2x/week  PT DURATION: 6 weeks  PLANNED INTERVENTIONS: Therapeutic exercises, Therapeutic activity, Neuromuscular re-education, Balance training, Gait training, Patient/Family education, Self Care, Joint mobilization, Stair training, DME instructions, Cryotherapy, Moist heat, Taping, Manual therapy, and Re-evaluation  PLAN FOR NEXT SESSION:  Check remaining STG. add to HEP for balance/strength/endurance, work on endurance, balance, LE strengthening, SciFit/Nustep      , PTA, CLT [facility] [address], [address] [place], [place] [address] [phone] 10/16/21, 3:42 PM     

## 2021-10-18 ENCOUNTER — Encounter: Payer: Self-pay | Admitting: Physical Therapy

## 2021-10-18 ENCOUNTER — Ambulatory Visit: Payer: Medicare PPO | Admitting: Physical Therapy

## 2021-10-18 DIAGNOSIS — M6281 Muscle weakness (generalized): Secondary | ICD-10-CM

## 2021-10-18 DIAGNOSIS — R2681 Unsteadiness on feet: Secondary | ICD-10-CM

## 2021-10-18 DIAGNOSIS — R293 Abnormal posture: Secondary | ICD-10-CM

## 2021-10-18 DIAGNOSIS — R2689 Other abnormalities of gait and mobility: Secondary | ICD-10-CM

## 2021-10-18 NOTE — Therapy (Addendum)
OUTPATIENT PHYSICAL THERAPY NEURO TREATMENT   Patient Name: Gary [name] MRN: [id number] DOB:05-03-1932, 88 y.o., male Today's Date: 10/18/2021   PCP: Clovia Cuff, MD REFERRING PROVIDER: Penni Bombard, MD    PT End of Session - 10/18/21 1320     Visit Number 6    Number of Visits 13   plus eval   Date for PT Re-Evaluation 11/06/21    Authorization Type [employer]    Progress Note Due on Visit 10    PT Start Time 1318    Equipment Utilized During Treatment Gait belt    Activity Tolerance Patient tolerated treatment well    Behavior During Therapy [facility] for tasks assessed/performed               Past Medical History:  Diagnosis Date   Adenomatous colon polyp    2010    Anemia    Anxiety    Arthritis    "hips and lower back"   Arthritis pain    Atypical mole 06/21/2003   left neck, inferior - slight to moderate atypia   Basal cell carcinoma 10/04/2014   right shin bcc (tx cx3, 78fu )   Blood transfusion    Chest pain 03/29/2011   2D Echo without contrast on 03/29/11 - EF= 55-60%.   Cholecystitis    Chronic back pain greater than 3 months duration    Chronic kidney disease    Colon polyps    Complication of anesthesia    once slow to wake up   Coronary angioplasty status 2011   Coronary artery disease    Diarrhea    Diverticulosis    Dyslipidemia    GERD (gastroesophageal reflux disease)    GI bleeding after 07/2009   "triggered by Plavix & ASA; from my diverticulosis"   H/O Clostridium difficile infection    H/O myocardial perfusion scan 04/04/2011   For Pre-noncardiac surgery - EF = 67%, no ischemia, and is considered low risk.   Heart attack ([place]) 07/2009   nonSTEMI with an occluded circumflex artery and collaterals.   Heart murmur    Hemorrhoids    History of shingles    Hyperlipidemia    Hypertension    Kidney stones    Lower extremity edema    Taking furosemide and it seems to be helping.   Neuromuscular disorder ([place])    Nodular  infiltrative basal cell carcinoma (BCC) 02/08/2021   Right Forearm-anterior (tx p bx)   Nonspecific chest pain 08/26/2012   Went to ED 08/26/12   Parkinsonism ([place])    SCCA (squamous cell carcinoma) of skin 02/08/2021   Right Dorsal Hand (well diff) (tx p bx)   Squamous cell carcinoma of skin 05/13/2000   Right outer forehead   Squamous cell carcinoma of skin 03/02/2001   Post crown - tx 03/19/2001   Squamous cell carcinoma of skin 09/01/2001   Left post auricular - tx 10/09/2001   Squamous cell carcinoma of skin 06/21/2003   Top right ear - tx 07/05/2003   Squamous cell carcinoma of skin 06/21/2003   Right upper arm - tx 07/05/2003   Squamous cell carcinoma of skin 10/12/2003   Left superior ear lobe - MOHs   Squamous cell carcinoma of skin 12/15/2006   Right ear, superior - tx 01/26/2007   Squamous cell carcinoma of skin 03/13/2009   Right temple - tx 05/08/2009   Squamous cell carcinoma of skin 03/24/2013   Upper left forearm - tx p bx  Squamous cell carcinoma of skin 12/06/2013   Right forearm - CX3 + 5FU   Squamous cell carcinoma of skin 02/10/2014   Right hand - tx p bx   Squamous cell carcinoma of skin 10/04/2014   Right shin - tx p bx   Squamous cell carcinoma of skin 08/21/2016   Left hand - tx p bx   Squamous cell carcinoma of skin 01/01/2017   Right hand - tx 02/06/2017   Squamous cell carcinoma of skin 10/21/2017   Left side of scalp - tx 03/12/2018   Squamous cell carcinoma of skin 10/21/2017   Left post scalp, inf - tx 08/13/2018   Squamous cell carcinoma of skin 10/21/2017   Right scalp - tx p bx   Squamous cell carcinoma of skin 10/21/2017   Left cheek - tx 03/12/2018   Squamous cell carcinoma of skin 08/13/2018   Left top hand, proximal pinky - tx p bx   Squamous cell carcinoma of skin 12/23/2018   Right bicep lower - tx p bx   Squamous cell carcinoma of skin 05/18/2019   Right 4th finger metacarpophalangeal joint   Squamous cell carcinoma of skin  05/18/2019   Left 5th finger metacarpophalangeal joint   Squamous cell carcinoma of skin 05/18/2019   Left 2nd finger metacarpophalangeal joint   Stomach problems    Syncope and collapse 04/16/2018   Past Surgical History:  Procedure Laterality Date   BACK SURGERY  1997   CARDIAC CATHETERIZATION  08/09/2009   Circumflex 100% occluded with right-to-left collaterals, mild irregularities in the proximal and distal LAD and diagonal system, normal LV systolic function, and minor infrarenal irregularities, but no abdominal aortic aneurysm.   CHOLECYSTECTOMY  04/12/2011   Procedure: CHOLECYSTECTOMY;  Surgeon: Ernestene Mention, MD;  Location: [facility] OR;  Service: General;  Laterality: N/A;   CHOLECYSTECTOMY  04/12/2011   Procedure: LAPAROSCOPIC CHOLECYSTECTOMY;  Surgeon: Ernestene Mention, MD;  Location: [facility] OR;  Service: General;  Laterality: N/A;  converted to open   COLON SURGERY     COLONOSCOPY   ESOPHAGOGASTRODUODENOSCOPY N/A 08/05/2013   Procedure: ESOPHAGOGASTRODUODENOSCOPY (EGD);  Surgeon: Beverley Fiedler, MD;  Location: [facility] ENDOSCOPY;  Service: Gastroenterology;  Laterality: N/A;   EYE SURGERY  1942   "right eye was crossed; they  corrected it"   LUMBAR FUSION  07/22/2012   PACEMAKER IMPLANT N/A 04/17/2018   Procedure: PACEMAKER IMPLANT;  Surgeon: Duke Salvia, MD;  Location: [facility] INVASIVE CV LAB;  Service: Cardiovascular;  Laterality: N/A;   POSTERIOR FUSION LUMBAR SPINE  08/1995   "bottom 4 vertebra"   SUBTOTAL COLECTOMY  01/24/2010   TOTAL HIP ARTHROPLASTY Right 08/03/2013   Procedure: TOTAL HIP ARTHROPLASTY ANTERIOR APPROACH;  Surgeon: Velna Ochs, MD;  Location: [facility] OR;  Service: Orthopedics;  Laterality: Right;   Patient Active Problem List   Diagnosis Date Noted   Constipation 08/12/2021   Perforated bowel (HCC) 08/11/2021   Thrombocytopenia (HCC) 08/11/2021   Mood disorder (HCC) 08/11/2021   Chronic back pain greater than 3 months duration    Pneumoperitoneum    Vitamin B12 deficiency  04/15/2019   Abnormal glucose 04/15/2019   Stage 4 chronic kidney disease (HCC) 04/15/2019   Dyspnea on exertion 04/15/2019   Pulmonary hypertension (HCC) 04/08/2019   Chest pain 03/27/2019   Complete heart block (HCC) 04/17/2018   Syncope and collapse 04/16/2018   Sinus pause 04/16/2018   Snoring 10/03/2017   Other fatigue 10/03/2017   Pre-syncope 05/14/2016   Murmur 05/14/2016  CAD in native artery 10/31/2015   History of MI (myocardial infarction) 10/31/2015   Preoperative cardiovascular examination 12/26/2014   History of colonic polyps 01/17/2014   PAF (paroxysmal atrial fibrillation) ([place]) 20/25/4270   Metabolic acidosis 62/37/6283   Acute renal failure ([place]) 08/13/2013   Stage 3b chronic kidney disease ([place]) 08/13/2013   Anemia in chronic kidney disease 08/13/2013   Hypokalemia 08/12/2013   Acute blood loss anemia 08/09/2013   Encephalopathy 08/09/2013   Hypotension, unspecified 08/07/2013   Complex renal cyst 08/07/2013   Renal mass, left 08/06/2013   Acute esophagitis 08/05/2013   Urinary retention 08/04/2013   S/P total hip arthroplasty 08/03/2013   Degenerative joint disease (DJD) of hip 08/03/2013    Class: Chronic   Internal hemorrhoids with other complication 15/17/6160   Tinnitus 10/22/2012   C. difficile colitis 07/26/2012   Ileus, postoperative ([place]) 07/24/2012   Somnolence 07/24/2012   Anxiety state 07/24/2012   Chest pain- ER 08/26/12 03/30/2011   Fever 03/28/2011   Leucocytosis 03/28/2011   CKD (chronic kidney disease), stage III ([place]) 03/28/2011   Hyponatremia 02/02/2011   BPH (benign prostatic hyperplasia) 02/01/2011   Cholecystitis chronic, acute 01/26/2011   Dyslipidemia 01/22/2011   Abdominal bloating 08/07/2010   Loose stools 08/07/2010   Coronary atherosclerosis 10/26/2009   Essential hypertension 06/07/2009   DIVERTICULOSIS OF COLON 10/31/2008   DIVERTICULOSIS, COLON, WITH HEMORRHAGE, surg 12/11  10/31/2008   Personal history of colonic  polyps 10/31/2008    ONSET DATE: 09/19/2021   REFERRING DIAG: R26.9 (ICD-10-CM) - Abnormality of gait and mobility   THERAPY DIAG:  Unsteadiness on feet  Muscle weakness (generalized)  Other abnormalities of gait and mobility  Abnormal posture  Rationale for Evaluation and Treatment Rehabilitation  PERTINENT HISTORY: anxiety, arthritis, basal cell carcinoma, back pain, chronic kidney disease, CAD, MI, heart murmur, HTN, lumbar fusion, pacemaker, right THR, mild memory loss, type 2 diabetes mellitus, heart block with pacer, chronic diastolic CHF, OSA with CPAP intolerance, and subtotal colectomy bleeding in 2011    PRECAUTIONS: Fall and ICD/Pacemaker   PATIENT GOALS: "I want to get to where I can be confident with a cane", "move better"    SUBJECTIVE:  No new complaints. No falls. No change in back pain.                                                                                                                                                                                     Pt accompanied by: significant other in lobby.   PAIN:  Are you having pain? Yes: NPRS scale: 3/10 Pain location: low back Pain description: achy/sharp  VITALS      TODAY'S TREATMENT:  STRENGTHENING  NuStep UE/LE's level 5 x 8 minutes with goal >/= 65 steps per minute for strengthening and activity tolerance.    GAIT: Gait pattern: step through pattern, decreased stride length, and trunk flexed Distance walked: 230 x 1, plus around clinic with session Assistive device utilized: Walker - 2 wheeled Level of assistance: SBA Comments: reminder cues for posture and to stay closer to walker Gait speed: 17.65 sec's= 1.86 ft/sec with RW   BALANCE/NMR:  Obstacle Negotiation: 4 small hurdles in parallel bars reciprocal stepping x 6 laps with SBA, light support on bars for balance.  On Airex: feet wide apart with EO for head movements left<>right, up<>down for ~10 reps each. CGA to min assist  for balance with cues on posture/weight shifting to assist.     PATIENT EDUCATION: Education details: continue HEP; progress toward goal checked today Person educated: Patient Education method: Explanation and Handouts Education comprehension: verbalized understanding   HOME EXERCISE PROGRAM: Access Code: [email or web address] URL: [email or web address] Date: 10/01/2021 Prepared by: Excell Seltzer  Exercises - Sit to Stand with Hands on Knees  - 1 x daily - 7 x weekly - 3 sets - 10 reps - Side Stepping with Counter Support  - 1 x daily - 7 x weekly - 1 sets - 10 reps - Standing March with Counter Support  - 1 x daily - 7 x weekly - 3 sets - 10 reps  Walking Program: Walk with your family. Walk during cooler times of the day. Starting next week, walk 5 min per day for 7 days. Following week add 5 minutes to your total time. Week 1: 10 min Week 2: 15 min Week 3: 20 min Week 4: 25 min...Marland KitchenMarland KitchenUntil you can walk to 30 min per day    GOALS: Goals reviewed with patient? Yes  SHORT TERM GOALS: Target date: 10/16/2021  Initiate HEP Baseline: 10/16/21: met with current program Goal status: MET  2.  Pt will improve 5 x STS to less than or equal to 20 seconds to demonstrate improved functional strength and transfer efficiency.  Baseline: 10/16/21: 18.78 sec's  Goal status: MET  3.  Pt will improve normal TUG to less than or equal to 30 seconds for improved functional mobility and decreased fall risk. Baseline:  10/16/21: 23.81 with RW Goal status: MET  4.  Pt will improve gait velocity to at least 1.5 ft/sec for improved gait efficiency and performance at mod I level Baseline: 10/18/21: 1.56 ft/sec Goal status: MET  5.  Pt will improve score on 6MWT to 450 ft with LRAD with no seated rest breaks to demonstrate improved endurance Baseline:10/16/21: 514 feet with RW with no rest breaks  Goal status: MET   LONG TERM GOALS: Target date: 11/06/2021  Pt will be independent with final HEP  for improved strength, balance, transfers and gait. Baseline: not established at eval Goal status: INITIAL  2.  Pt will improve 5 x STS to less than or equal to 15 seconds to demonstrate improved functional strength and transfer efficiency.  Baseline: 25.28 sec (8/15) Goal status: INITIAL  3.  Pt will improve normal TUG to less than or equal to 25 seconds for improved functional mobility and decreased fall risk. Baseline: 35.78 sec (8/15) Goal status: INITIAL  4.  Pt will improve gait velocity to at least 2.0 ft/sec for improved gait efficiency and performance at mod I level  Baseline: 1.09 ft/sec Goal status: INITIAL  5.  Pt will improve score on 6MWT to 500 ft with  LRAD with no seated rest break to demonstrate improved endurance Baseline: 334 ft with RW with one seated rest break (8/21) Goal status: INITIAL   ASSESSMENT:  CLINICAL IMPRESSION: Today's skilled session focused on progress toward remaining STG with gait speed goal met. Remainder of session continued to focus on strengthening, balance and activity tolerance with no issues noted or reported in session.    OBJECTIVE IMPAIRMENTS Abnormal gait, decreased activity tolerance, decreased balance, decreased cognition, decreased endurance, decreased mobility, difficulty walking, decreased strength, decreased safety awareness, impaired sensation, and postural dysfunction.   ACTIVITY LIMITATIONS carrying, lifting, bending, standing, squatting, and stairs  PARTICIPATION LIMITATIONS: cleaning, laundry, driving, community activity, and church  PERSONAL FACTORS Age, Fitness, Transportation, and 3+ comorbidities:    anxiety, arthritis, basal cell carcinoma, back pain, chronic kidney disease, CAD, MI, heart murmur, HTN, lumbar fusion, pacemaker, right THR, mild memory loss, type 2 diabetes mellitus, heart block with pacer, chronic diastolic CHF, OSA with CPAP intolerance, and subtotal colectomy bleeding in 2011 are also affecting  patient's functional outcome.   REHAB POTENTIAL: Good  CLINICAL DECISION MAKING: Evolving/moderate complexity  EVALUATION COMPLEXITY: Moderate  PLAN: PT FREQUENCY: 2x/week  PT DURATION: 6 weeks  PLANNED INTERVENTIONS: Therapeutic exercises, Therapeutic activity, Neuromuscular re-education, Balance training, Gait training, Patient/Family education, Self Care, Joint mobilization, Stair training, DME instructions, Cryotherapy, Moist heat, Taping, Manual therapy, and Re-evaluation  PLAN FOR NEXT SESSION:  add to HEP for balance/strength/endurance, work on endurance, balance, LE strengthening, SciFit/Nustep     Willow Ora, PTA, [place] [address], [place], [place] [address] [phone] 10/18/21, 1:20 PM

## 2021-10-22 ENCOUNTER — Ambulatory Visit: Payer: Medicare PPO | Admitting: Physical Therapy

## 2021-10-24 ENCOUNTER — Ambulatory Visit: Payer: Medicare PPO | Admitting: Physical Therapy

## 2021-10-24 NOTE — Progress Notes (Signed)
Remote pacemaker transmission.   

## 2021-10-30 ENCOUNTER — Encounter: Payer: Self-pay | Admitting: Physical Therapy

## 2021-10-30 ENCOUNTER — Ambulatory Visit: Payer: Medicare PPO | Admitting: Physical Therapy

## 2021-10-30 DIAGNOSIS — R2681 Unsteadiness on feet: Secondary | ICD-10-CM

## 2021-10-30 DIAGNOSIS — M6281 Muscle weakness (generalized): Secondary | ICD-10-CM

## 2021-10-30 DIAGNOSIS — R293 Abnormal posture: Secondary | ICD-10-CM

## 2021-10-30 DIAGNOSIS — R2689 Other abnormalities of gait and mobility: Secondary | ICD-10-CM

## 2021-10-30 NOTE — Therapy (Signed)
OUTPATIENT PHYSICAL THERAPY NEURO TREATMENT   Patient Name: Gary [name] MRN: [id number] DOB:Oct 08, 1932, 88 y.o., male Today's Date: 10/30/2021   PCP: Clovia Cuff, MD REFERRING PROVIDER: Penni Bombard, MD    PT End of Session - 10/30/21 1453     Visit Number 7    Number of Visits 13   plus eval   Date for PT Re-Evaluation 11/06/21    Authorization Type [employer]    Progress Note Due on Visit 10    PT Start Time 1450    PT Stop Time 1530    PT Time Calculation (min) 40 min    Equipment Utilized During Treatment Gait belt    Activity Tolerance Patient tolerated treatment well    Behavior During Therapy WFL for tasks assessed/performed               Past Medical History:  Diagnosis Date   Adenomatous colon polyp    2010    Anemia    Anxiety    Arthritis    "hips and lower back"   Arthritis pain    Atypical mole 06/21/2003   left neck, inferior - slight to moderate atypia   Basal cell carcinoma 10/04/2014   right shin bcc (tx cx3, 78fu )   Blood transfusion    Chest pain 03/29/2011   2D Echo without contrast on 03/29/11 - EF= 55-60%.   Cholecystitis    Chronic back pain greater than 3 months duration    Chronic kidney disease    Colon polyps    Complication of anesthesia    once slow to wake up   Coronary angioplasty status 2011   Coronary artery disease    Diarrhea    Diverticulosis    Dyslipidemia    GERD (gastroesophageal reflux disease)    GI bleeding after 07/2009   "triggered by Plavix & ASA; from my diverticulosis"   H/O Clostridium difficile infection    H/O myocardial perfusion scan 04/04/2011   For Pre-noncardiac surgery - EF = 67%, no ischemia, and is considered low risk.   Heart attack ([place]) 07/2009   nonSTEMI with an occluded circumflex artery and collaterals.   Heart murmur    Hemorrhoids    History of shingles    Hyperlipidemia    Hypertension    Kidney stones    Lower extremity edema    Taking furosemide and it seems  to be helping.   Neuromuscular disorder ([place])    Nodular infiltrative basal cell carcinoma (BCC) 02/08/2021   Right Forearm-anterior (tx p bx)   Nonspecific chest pain 08/26/2012   Went to ED 08/26/12   Parkinsonism ([place])    SCCA (squamous cell carcinoma) of skin 02/08/2021   Right Dorsal Hand (well diff) (tx p bx)   Squamous cell carcinoma of skin 05/13/2000   Right outer forehead   Squamous cell carcinoma of skin 03/02/2001   Post crown - tx 03/19/2001   Squamous cell carcinoma of skin 09/01/2001   Left post auricular - tx 10/09/2001   Squamous cell carcinoma of skin 06/21/2003   Top right ear - tx 07/05/2003   Squamous cell carcinoma of skin 06/21/2003   Right upper arm - tx 07/05/2003   Squamous cell carcinoma of skin 10/12/2003   Left superior ear lobe - MOHs   Squamous cell carcinoma of skin 12/15/2006   Right ear, superior - tx 01/26/2007   Squamous cell carcinoma of skin 03/13/2009   Right temple - tx 05/08/2009   Squamous  cell carcinoma of skin 03/24/2013   Upper left forearm - tx p bx   Squamous cell carcinoma of skin 12/06/2013   Right forearm - CX3 + 5FU   Squamous cell carcinoma of skin 02/10/2014   Right hand - tx p bx   Squamous cell carcinoma of skin 10/04/2014   Right shin - tx p bx   Squamous cell carcinoma of skin 08/21/2016   Left hand - tx p bx   Squamous cell carcinoma of skin 01/01/2017   Right hand - tx 02/06/2017   Squamous cell carcinoma of skin 10/21/2017   Left side of scalp - tx 03/12/2018   Squamous cell carcinoma of skin 10/21/2017   Left post scalp, inf - tx 08/13/2018   Squamous cell carcinoma of skin 10/21/2017   Right scalp - tx p bx   Squamous cell carcinoma of skin 10/21/2017   Left cheek - tx 03/12/2018   Squamous cell carcinoma of skin 08/13/2018   Left top hand, proximal pinky - tx p bx   Squamous cell carcinoma of skin 12/23/2018   Right bicep lower - tx p bx   Squamous cell carcinoma of skin 05/18/2019   Right 4th finger  metacarpophalangeal joint   Squamous cell carcinoma of skin 05/18/2019   Left 5th finger metacarpophalangeal joint   Squamous cell carcinoma of skin 05/18/2019   Left 2nd finger metacarpophalangeal joint   Stomach problems    Syncope and collapse 04/16/2018   Past Surgical History:  Procedure Laterality Date   BACK SURGERY  1997   CARDIAC CATHETERIZATION  08/09/2009   Circumflex 100% occluded with right-to-left collaterals, mild irregularities in the proximal and distal LAD and diagonal system, normal LV systolic function, and minor infrarenal irregularities, but no abdominal aortic aneurysm.   CHOLECYSTECTOMY  04/12/2011   Procedure: CHOLECYSTECTOMY;  Surgeon: Haywood M Ingram, MD;  Location: [facility] OR;  Service: General;  Laterality: N/A;   CHOLECYSTECTOMY  04/12/2011   Procedure: LAPAROSCOPIC CHOLECYSTECTOMY;  Surgeon: Haywood M Ingram, MD;  Location: [facility] OR;  Service: General;  Laterality: N/A;  converted to open   COLON SURGERY     COLONOSCOPY   ESOPHAGOGASTRODUODENOSCOPY N/A 08/05/2013   Procedure: ESOPHAGOGASTRODUODENOSCOPY (EGD);  Surgeon: Jay M Pyrtle, MD;  Location: [facility] ENDOSCOPY;  Service: Gastroenterology;  Laterality: N/A;   EYE SURGERY  1942   "right eye was crossed; they  corrected it"   LUMBAR FUSION  07/22/2012   PACEMAKER IMPLANT N/A 04/17/2018   Procedure: PACEMAKER IMPLANT;  Surgeon: Klein, Steven C, MD;  Location: [facility];  Service: Cardiovascular;  Laterality: N/A;   POSTERIOR FUSION LUMBAR SPINE  08/1995   "bottom 4 vertebra"   SUBTOTAL COLECTOMY  01/24/2010   TOTAL HIP ARTHROPLASTY Right 08/03/2013   Procedure: TOTAL HIP ARTHROPLASTY ANTERIOR APPROACH;  Surgeon: Peter G Dalldorf, MD;  Location: [facility] OR;  Service: Orthopedics;  Laterality: Right;   Patient Active Problem List   Diagnosis Date Noted   Constipation 08/12/2021   Perforated bowel (HCC) 08/11/2021   Thrombocytopenia (HCC) 08/11/2021   Mood disorder (HCC) 08/11/2021   Chronic back pain greater than 3  months duration    Pneumoperitoneum    Vitamin B12 deficiency 04/15/2019   Abnormal glucose 04/15/2019   Stage 4 chronic kidney disease (HCC) 04/15/2019   Dyspnea on exertion 04/15/2019   Pulmonary hypertension (HCC) 04/08/2019   Chest pain 03/27/2019   Complete heart block (HCC) 04/17/2018   Syncope and collapse 04/16/2018   Sinus pause 04/16/2018     Snoring 10/03/2017   Other fatigue 10/03/2017   Pre-syncope 05/14/2016   Murmur 05/14/2016   CAD in native artery 10/31/2015   History of MI (myocardial infarction) 10/31/2015   Preoperative cardiovascular examination 12/26/2014   History of colonic polyps 01/17/2014   PAF (paroxysmal atrial fibrillation) (HCC) 61/51/8343   Metabolic acidosis 73/57/8978   Acute renal failure ([place]) 08/13/2013   Stage 3b chronic kidney disease ([place]) 08/13/2013   Anemia in chronic kidney disease 08/13/2013   Hypokalemia 08/12/2013   Acute blood loss anemia 08/09/2013   Encephalopathy 08/09/2013   Hypotension, unspecified 08/07/2013   Complex renal cyst 08/07/2013   Renal mass, left 08/06/2013   Acute esophagitis 08/05/2013   Urinary retention 08/04/2013   S/P total hip arthroplasty 08/03/2013   Degenerative joint disease (DJD) of hip 08/03/2013    Class: Chronic   Internal hemorrhoids with other complication 47/84/1282   Tinnitus 10/22/2012   C. difficile colitis 07/26/2012   Ileus, postoperative ([place]) 07/24/2012   Somnolence 07/24/2012   Anxiety state 07/24/2012   Chest pain- ER 08/26/12 03/30/2011   Fever 03/28/2011   Leucocytosis 03/28/2011   CKD (chronic kidney disease), stage III ([place]) 03/28/2011   Hyponatremia 02/02/2011   BPH (benign prostatic hyperplasia) 02/01/2011   Cholecystitis chronic, acute 01/26/2011   Dyslipidemia 01/22/2011   Abdominal bloating 08/07/2010   Loose stools 08/07/2010   Coronary atherosclerosis 10/26/2009   Essential hypertension 06/07/2009   DIVERTICULOSIS OF COLON 10/31/2008   DIVERTICULOSIS, COLON, WITH  HEMORRHAGE, surg 12/11  10/31/2008   Personal history of colonic polyps 10/31/2008    ONSET DATE: 09/19/2021   REFERRING DIAG: R26.9 (ICD-10-CM) - Abnormality of gait and mobility   THERAPY DIAG:  Unsteadiness on feet  Muscle weakness (generalized)  Other abnormalities of gait and mobility  Abnormal posture  Rationale for Evaluation and Treatment Rehabilitation  PERTINENT HISTORY: anxiety, arthritis, basal cell carcinoma, back pain, chronic kidney disease, CAD, MI, heart murmur, HTN, lumbar fusion, pacemaker, right THR, mild memory loss, type 2 diabetes mellitus, heart block with pacer, chronic diastolic CHF, OSA with CPAP intolerance, and subtotal colectomy bleeding in 2011    PRECAUTIONS: Fall and ICD/Pacemaker   PATIENT GOALS: "I want to get to where I can be confident with a cane", "move better"    SUBJECTIVE:  Missed last week due to increased pain in left hip/LE and walking being even harder. Better this week. Does have an appt with Dr. Rip Harbour at [facility] and sports medicine for an injection to the hip.  Pt accompanied by: significant other in car.   PAIN:  Are you having pain? Yes: NPRS scale: 5-6/10 Pain location: low back and left hip Pain description: achy/sharp  VITALS none taken today     TODAY'S TREATMENT: STRENGTHENING  NuStep UE/LE's level 5 x 8 minutes with goal >/= 65 steps per minute for strengthening and activity tolerance.    GAIT: Gait pattern: step through pattern, decreased stride length, and trunk flexed Distance walked: 230 feet x 2 reps, plus around clinic with session Assistive device utilized: Walker - 2 wheeled Level of assistance: SBA Comments: reminder cues for posture and to stay closer to walker    BALANCE/NMR:  In parallel bars on blue mat  with light UE posture: - side stepping left<>right x 3 laps each way with cues on forward gaze, step length and step height.  - slow marching forward<>backward x 3 laps each way with cues on posture, high knees and weight shifting.      PATIENT EDUCATION: Education details: continue HEP Person educated: Patient Education method: Theatre stage manager Education comprehension: verbalized understanding   HOME EXERCISE PROGRAM: Access Code: [email or web address] URL: [email or web address] Date: 10/01/2021 Prepared by: Excell Seltzer  Exercises - Sit to Stand with Hands on Knees  - 1 x daily - 7 x weekly - 3 sets - 10 reps - Side Stepping with Counter Support  - 1 x daily - 7 x weekly - 1 sets - 10 reps - Standing March with Counter Support  - 1 x daily - 7 x weekly - 3 sets - 10 reps  Walking Program: Walk with your family. Walk during cooler times of the day. Starting next week, walk 5 min per day for 7 days. Following week add 5 minutes to your total time. Week 1: 10 min Week 2: 15 min Week 3: 20 min Week 4: 25 min...Marland KitchenMarland KitchenUntil you can walk to 30 min per day    GOALS: Goals reviewed with patient? Yes  SHORT TERM GOALS: Target date: 10/16/2021  Initiate HEP Baseline: 10/16/21: met with current program Goal status: MET  2.  Pt will improve 5 x STS to less than or equal to 20 seconds to demonstrate improved functional strength and transfer efficiency.  Baseline: 10/16/21: 18.78 sec's  Goal status: MET  3.  Pt will improve normal TUG to less than or equal to 30 seconds for improved functional mobility and decreased fall risk. Baseline:  10/16/21: 23.81 with RW Goal status: MET  4.  Pt will improve gait velocity to at least 1.5 ft/sec for improved gait efficiency and performance at mod I level Baseline: 10/18/21: 1.56 ft/sec Goal status: MET  5.  Pt will improve score on 6MWT to 450 ft with LRAD with no seated rest breaks to demonstrate improved endurance Baseline:10/16/21: 514  feet with RW with no rest breaks  Goal status: MET   LONG TERM GOALS: Target date: 11/06/2021  Pt will be independent with final HEP for improved strength, balance, transfers and gait. Baseline: not established at eval Goal status: INITIAL  2.  Pt will improve 5 x STS to less than or equal to 15 seconds to demonstrate improved functional strength and transfer efficiency.  Baseline: 25.28 sec (8/15) Goal status: INITIAL  3.  Pt will improve normal TUG to less than or equal to 25 seconds for improved functional mobility and decreased fall risk. Baseline: 35.78 sec (8/15) Goal status: INITIAL  4.  Pt will improve gait velocity to at least 2.0 ft/sec for improved  gait efficiency and performance at mod I level  Baseline: 1.09 ft/sec Goal status: INITIAL  5.  Pt will improve score on 6MWT to 500 ft with LRAD with no seated rest break to demonstrate improved endurance Baseline: 334 ft with RW with one seated rest break (8/21) Goal status: INITIAL   ASSESSMENT:  CLINICAL IMPRESSION: Today's skilled session continued to focus on strengthening, gait, activity tolerance and balance training. No issues or increased pain reported in session. The pt is making progress and should benefit from continued PT to progress toward unmet goals.   OBJECTIVE IMPAIRMENTS Abnormal gait, decreased activity tolerance, decreased balance, decreased cognition, decreased endurance, decreased mobility, difficulty walking, decreased strength, decreased safety awareness, impaired sensation, and postural dysfunction.   ACTIVITY LIMITATIONS carrying, lifting, bending, standing, squatting, and stairs  PARTICIPATION LIMITATIONS: cleaning, laundry, driving, community activity, and church  PERSONAL FACTORS Age, Fitness, Transportation, and 3+ comorbidities:    anxiety, arthritis, basal cell carcinoma, back pain, chronic kidney disease, CAD, MI, heart murmur, HTN, lumbar fusion, pacemaker, right THR, mild memory loss,  type 2 diabetes mellitus, heart block with pacer, chronic diastolic CHF, OSA with CPAP intolerance, and subtotal colectomy bleeding in 2011 are also affecting patient's functional outcome.   REHAB POTENTIAL: Good  CLINICAL DECISION MAKING: Evolving/moderate complexity  EVALUATION COMPLEXITY: Moderate  PLAN: PT FREQUENCY: 2x/week  PT DURATION: 6 weeks  PLANNED INTERVENTIONS: Therapeutic exercises, Therapeutic activity, Neuromuscular re-education, Balance training, Gait training, Patient/Family education, Self Care, Joint mobilization, Stair training, DME instructions, Cryotherapy, Moist heat, Taping, Manual therapy, and Re-evaluation  PLAN FOR NEXT SESSION:  add to HEP for balance/strength/endurance, work on endurance, balance, LE strengthening, SciFit/Nustep     Willow Ora, PTA, [facility] [address], [place], [place] [address] [phone] 10/30/21, 4:39 PM

## 2021-11-01 ENCOUNTER — Encounter: Payer: Self-pay | Admitting: Physical Therapy

## 2021-11-01 ENCOUNTER — Ambulatory Visit: Payer: Medicare PPO | Admitting: Physical Therapy

## 2021-11-01 DIAGNOSIS — M6281 Muscle weakness (generalized): Secondary | ICD-10-CM

## 2021-11-01 DIAGNOSIS — R2681 Unsteadiness on feet: Secondary | ICD-10-CM

## 2021-11-01 DIAGNOSIS — R2689 Other abnormalities of gait and mobility: Secondary | ICD-10-CM

## 2021-11-01 NOTE — Therapy (Signed)
OUTPATIENT PHYSICAL THERAPY NEURO TREATMENT   Patient Name: Gary [name] MRN: [id number] DOB:1932-09-05, 88 y.o., male Today's Date: 11/01/2021   PCP: Clovia Cuff, MD REFERRING PROVIDER: Penni Bombard, MD    PT End of Session - 11/01/21 1407     Visit Number 8    Number of Visits 13   plus eval   Date for PT Re-Evaluation 11/06/21    Authorization Type [employer]    Authorization Time Period 13 visits 09/25/21-11/06/21    Authorization - Visit Number 8    Authorization - Number of Visits 13    Progress Note Due on Visit 10    PT Start Time 8016    PT Stop Time 1444    PT Time Calculation (min) 41 min    Equipment Utilized During Treatment Gait belt    Activity Tolerance Patient tolerated treatment well    Behavior During Therapy WFL for tasks assessed/performed               Past Medical History:  Diagnosis Date   Adenomatous colon polyp    2010    Anemia    Anxiety    Arthritis    "hips and lower back"   Arthritis pain    Atypical mole 06/21/2003   left neck, inferior - slight to moderate atypia   Basal cell carcinoma 10/04/2014   right shin bcc (tx cx3, 29f )   Blood transfusion    Chest pain 03/29/2011   2D Echo without contrast on 03/29/11 - EF= 55-60%.   Cholecystitis    Chronic back pain greater than 3 months duration    Chronic kidney disease    Colon polyps    Complication of anesthesia    once slow to wake up   Coronary angioplasty status 2011   Coronary artery disease    Diarrhea    Diverticulosis    Dyslipidemia    GERD (gastroesophageal reflux disease)    GI bleeding after 07/2009   "triggered by Plavix & ASA; from my diverticulosis"   H/O Clostridium difficile infection    H/O myocardial perfusion scan 04/04/2011   For Pre-noncardiac surgery - EF = 67%, no ischemia, and is considered low risk.   Heart attack ([place] 07/2009   nonSTEMI with an occluded circumflex artery and collaterals.   Heart murmur    Hemorrhoids     History of shingles    Hyperlipidemia    Hypertension    Kidney stones    Lower extremity edema    Taking furosemide and it seems to be helping.   Neuromuscular disorder ([place]    Nodular infiltrative basal cell carcinoma (BCC) 02/08/2021   Right Forearm-anterior (tx p bx)   Nonspecific chest pain 08/26/2012   Went to ED 08/26/12   Parkinsonism ([place]    SCCA (squamous cell carcinoma) of skin 02/08/2021   Right Dorsal Hand (well diff) (tx p bx)   Squamous cell carcinoma of skin 05/13/2000   Right outer forehead   Squamous cell carcinoma of skin 03/02/2001   Post crown - tx 03/19/2001   Squamous cell carcinoma of skin 09/01/2001   Left post auricular - tx 10/09/2001   Squamous cell carcinoma of skin 06/21/2003   Top right ear - tx 07/05/2003   Squamous cell carcinoma of skin 06/21/2003   Right upper arm - tx 07/05/2003   Squamous cell carcinoma of skin 10/12/2003   Left superior ear lobe - MOHs   Squamous cell carcinoma of skin 12/15/2006  Right ear, superior - tx 01/26/2007   Squamous cell carcinoma of skin 03/13/2009   Right temple - tx 05/08/2009   Squamous cell carcinoma of skin 03/24/2013   Upper left forearm - tx p bx   Squamous cell carcinoma of skin 12/06/2013   Right forearm - CX3 + 5FU   Squamous cell carcinoma of skin 02/10/2014   Right hand - tx p bx   Squamous cell carcinoma of skin 10/04/2014   Right shin - tx p bx   Squamous cell carcinoma of skin 08/21/2016   Left hand - tx p bx   Squamous cell carcinoma of skin 01/01/2017   Right hand - tx 02/06/2017   Squamous cell carcinoma of skin 10/21/2017   Left side of scalp - tx 03/12/2018   Squamous cell carcinoma of skin 10/21/2017   Left post scalp, inf - tx 08/13/2018   Squamous cell carcinoma of skin 10/21/2017   Right scalp - tx p bx   Squamous cell carcinoma of skin 10/21/2017   Left cheek - tx 03/12/2018   Squamous cell carcinoma of skin 08/13/2018   Left top hand, proximal pinky - tx p bx   Squamous  cell carcinoma of skin 12/23/2018   Right bicep lower - tx p bx   Squamous cell carcinoma of skin 05/18/2019   Right 4th finger metacarpophalangeal joint   Squamous cell carcinoma of skin 05/18/2019   Left 5th finger metacarpophalangeal joint   Squamous cell carcinoma of skin 05/18/2019   Left 2nd finger metacarpophalangeal joint   Stomach problems    Syncope and collapse 04/16/2018   Past Surgical History:  Procedure Laterality Date   [place]  08/09/2009   Circumflex 100% occluded with right-to-left collaterals, mild irregularities in the proximal and distal LAD and diagonal system, normal LV systolic function, and minor infrarenal irregularities, but no abdominal aortic aneurysm.   CHOLECYSTECTOMY  04/12/2011   Procedure: CHOLECYSTECTOMY;  Surgeon: Adin Hector, MD;  Location: [place];  Service: General;  Laterality: N/A;   CHOLECYSTECTOMY  04/12/2011   Procedure: LAPAROSCOPIC CHOLECYSTECTOMY;  Surgeon: Adin Hector, MD;  Location: [place];  Service: General;  Laterality: N/A;  converted to open   COLON SURGERY     COLONOSCOPY   ESOPHAGOGASTRODUODENOSCOPY N/A 08/05/2013   Procedure: ESOPHAGOGASTRODUODENOSCOPY (EGD);  Surgeon: Jerene Bears, MD;  Location: [place];  Service: Gastroenterology;  Laterality: N/A;   EYE SURGERY  1942   "right eye was crossed; they  corrected it"   LUMBAR FUSION  07/22/2012   PACEMAKER IMPLANT N/A 04/17/2018   Procedure: PACEMAKER IMPLANT;  Surgeon: Deboraha Sprang, MD;  Location: [facility];  Service: Cardiovascular;  Laterality: N/A;   POSTERIOR FUSION LUMBAR SPINE  08/1995   "bottom 4 vertebra"   SUBTOTAL COLECTOMY  01/24/2010   TOTAL HIP ARTHROPLASTY Right 08/03/2013   Procedure: TOTAL HIP ARTHROPLASTY ANTERIOR APPROACH;  Surgeon: Hessie Dibble, MD;  Location: [place];  Service: Orthopedics;  Laterality: Right;   Patient Active Problem List   Diagnosis Date Noted   Constipation 08/12/2021   Perforated  bowel ([place]) 08/11/2021   Thrombocytopenia ([place]) 08/11/2021   Mood disorder ([place]) 08/11/2021   Chronic back pain greater than 3 months duration    Pneumoperitoneum    Vitamin B12 deficiency 04/15/2019   Abnormal glucose 04/15/2019   Stage 4 chronic kidney disease ([place]) 04/15/2019   Dyspnea on exertion 04/15/2019   Pulmonary hypertension ([place]) 04/08/2019  Chest pain 03/27/2019   Complete heart block (HCC) 04/17/2018   Syncope and collapse 04/16/2018   Sinus pause 04/16/2018   Snoring 10/03/2017   Other fatigue 10/03/2017   Pre-syncope 05/14/2016   Murmur 05/14/2016   CAD in native artery 10/31/2015   History of MI (myocardial infarction) 10/31/2015   Preoperative cardiovascular examination 12/26/2014   History of colonic polyps 01/17/2014   PAF (paroxysmal atrial fibrillation) (HCC) 41/96/2229   Metabolic acidosis 79/89/2119   Acute renal failure ([place]) 08/13/2013   Stage 3b chronic kidney disease ([place]) 08/13/2013   Anemia in chronic kidney disease 08/13/2013   Hypokalemia 08/12/2013   Acute blood loss anemia 08/09/2013   Encephalopathy 08/09/2013   Hypotension, unspecified 08/07/2013   Complex renal cyst 08/07/2013   Renal mass, left 08/06/2013   Acute esophagitis 08/05/2013   Urinary retention 08/04/2013   S/P total hip arthroplasty 08/03/2013   Degenerative joint disease (DJD) of hip 08/03/2013    Class: Chronic   Internal hemorrhoids with other complication 41/74/0814   Tinnitus 10/22/2012   C. difficile colitis 07/26/2012   Ileus, postoperative ([place]) 07/24/2012   Somnolence 07/24/2012   Anxiety state 07/24/2012   Chest pain- ER 08/26/12 03/30/2011   Fever 03/28/2011   Leucocytosis 03/28/2011   CKD (chronic kidney disease), stage III ([place]) 03/28/2011   Hyponatremia 02/02/2011   BPH (benign prostatic hyperplasia) 02/01/2011   Cholecystitis chronic, acute 01/26/2011   Dyslipidemia 01/22/2011   Abdominal bloating 08/07/2010   Loose stools 08/07/2010   Coronary  atherosclerosis 10/26/2009   Essential hypertension 06/07/2009   DIVERTICULOSIS OF COLON 10/31/2008   DIVERTICULOSIS, COLON, WITH HEMORRHAGE, surg 12/11  10/31/2008   Personal history of colonic polyps 10/31/2008    ONSET DATE: 09/19/2021   REFERRING DIAG: R26.9 (ICD-10-CM) - Abnormality of gait and mobility   THERAPY DIAG:  Unsteadiness on feet  Muscle weakness (generalized)  Other abnormalities of gait and mobility  Rationale for Evaluation and Treatment Rehabilitation  PERTINENT HISTORY: anxiety, arthritis, basal cell carcinoma, back pain, chronic kidney disease, CAD, MI, heart murmur, HTN, lumbar fusion, pacemaker, right THR, mild memory loss, type 2 diabetes mellitus, heart block with pacer, chronic diastolic CHF, OSA with CPAP intolerance, and subtotal colectomy bleeding in 2011    PRECAUTIONS: Fall and ICD/Pacemaker   PATIENT GOALS: "I want to get to where I can be confident with a cane", "move better"    SUBJECTIVE:  Had the injection into his left hip, feeling better pain wise today. 0/10 pain in left hip. Has a new MD order to address left greater trochanter bursitis, hip abductor tendonopathy. No falls.    Pt accompanied by: significant other in car.   PAIN:  Are you having pain? Yes: NPRS scale: 2-3/10 Pain location: low back Pain description: achy/sharp Decrease pain: tylenol     TODAY'S TREATMENT:  SELF CARE: Pt brought in a new MD order for left hip (see subjective above). Discussed that this PTA will notify primary PT whom he sees at next appt. Pt did state that he could not go to the ortho clinic where the referral came from as they do not take his insurance. Informed pt PTA would also inform primary PT if this information.    STRENGTHENING  NuStep UE/LE's level 5 x 8 minutes with goal >/= 65 steps per minute for strengthening and activity tolerance.    GAIT: Gait pattern: step through pattern, decreased stride length, and trunk flexed Distance  walked:115 x 2, plus around clinic with session Assistive device utilized:  Walker - 2 wheeled Level of assistance: SBA Comments: reminder cues for posture and to stay closer to walker   [facility] PT Assessment - 11/01/21 1414       Standardized Balance Assessment   Standardized Balance Assessment Timed Up and Go Test;Five Times Sit to Stand;10 meter walk test    Five times sit to stand comments  15.06 sec's with UE assist from standard height chair    10 Meter Walk 23.69 sec's with RW: 1.38 ft/sec or 0.42 m/s      Timed Up and Go Test   TUG Normal TUG    Normal TUG (seconds) 22.66   with RW              PATIENT EDUCATION: Education details: continue HEP; progress toward goals checked today, see self care section above Person educated: Patient Education method: Explanation and Handouts Education comprehension: verbalized understanding   HOME EXERCISE PROGRAM: Access Code: [email or web address] URL: [email or web address] Date: 10/01/2021 Prepared by: Excell Seltzer  Exercises - Sit to Stand with Hands on Knees  - 1 x daily - 7 x weekly - 3 sets - 10 reps - Side Stepping with Counter Support  - 1 x daily - 7 x weekly - 1 sets - 10 reps - Standing March with Counter Support  - 1 x daily - 7 x weekly - 3 sets - 10 reps  Walking Program: Walk with your family. Walk during cooler times of the day. Starting next week, walk 5 min per day for 7 days. Following week add 5 minutes to your total time. Week 1: 10 min Week 2: 15 min Week 3: 20 min Week 4: 25 min...Marland KitchenMarland KitchenUntil you can walk to 30 min per day    GOALS: Goals reviewed with patient? Yes  SHORT TERM GOALS: Target date: 10/16/2021  Initiate HEP Baseline: 10/16/21: met with current program Goal status: MET  2.  Pt will improve 5 x STS to less than or equal to 20 seconds to demonstrate improved functional strength and transfer efficiency.  Baseline: 10/16/21: 18.78 sec's  Goal status: MET  3.  Pt will improve normal TUG to  less than or equal to 30 seconds for improved functional mobility and decreased fall risk. Baseline:  10/16/21: 23.81 with RW Goal status: MET  4.  Pt will improve gait velocity to at least 1.5 ft/sec for improved gait efficiency and performance at mod I level Baseline: 10/18/21: 1.56 ft/sec Goal status: MET  5.  Pt will improve score on 6MWT to 450 ft with LRAD with no seated rest breaks to demonstrate improved endurance Baseline:10/16/21: 514 feet with RW with no rest breaks  Goal status: MET   LONG TERM GOALS: Target date: 11/06/2021  Pt will be independent with final HEP for improved strength, balance, transfers and gait. Baseline: not established at eval Goal status: INITIAL  2.  Pt will improve 5 x STS to less than or equal to 15 seconds to demonstrate improved functional strength and transfer efficiency.  Baseline: 11/01/21: 15.06 sec's. 25.28 sec (8/15) Goal status: MET  3.  Pt will improve normal TUG to less than or equal to 25 seconds for improved functional mobility and decreased fall risk. Baseline: 11/01/21: 22.66 sec's  Goal status: MET  4.  Pt will improve gait velocity to at least 2.0 ft/sec for improved gait efficiency and performance at mod I level  Baseline: 11/01/21: 1.38 ft/sec, improved from 1.09 ft/sec, just not to goal Goal status: PARTIALLY MET  5.  Pt  will improve score on 6MWT to 500 ft with LRAD with no seated rest break to demonstrate improved endurance Baseline: 334 ft with RW with one seated rest break (8/21) Goal status: INITIAL   ASSESSMENT:  CLINICAL IMPRESSION: Today's skilled session focused on progress toward LTGs with pt meeting 5 time sit to stand and TUG goals. Pt partially met his gait speed goal. Will plan to check remaining LTGs at next session. Primary PT to see pt and determine plan of care from there.    OBJECTIVE IMPAIRMENTS Abnormal gait, decreased activity tolerance, decreased balance, decreased cognition, decreased endurance, decreased  mobility, difficulty walking, decreased strength, decreased safety awareness, impaired sensation, and postural dysfunction.   ACTIVITY LIMITATIONS carrying, lifting, bending, standing, squatting, and stairs  PARTICIPATION LIMITATIONS: cleaning, laundry, driving, community activity, and church  PERSONAL FACTORS Age, Fitness, Transportation, and 3+ comorbidities:    anxiety, arthritis, basal cell carcinoma, back pain, chronic kidney disease, CAD, MI, heart murmur, HTN, lumbar fusion, pacemaker, right THR, mild memory loss, type 2 diabetes mellitus, heart block with pacer, chronic diastolic CHF, OSA with CPAP intolerance, and subtotal colectomy bleeding in 2011 are also affecting patient's functional outcome.   REHAB POTENTIAL: Good  CLINICAL DECISION MAKING: Evolving/moderate complexity  EVALUATION COMPLEXITY: Moderate  PLAN: PT FREQUENCY: 2x/week  PT DURATION: 6 weeks  PLANNED INTERVENTIONS: Therapeutic exercises, Therapeutic activity, Neuromuscular re-education, Balance training, Gait training, Patient/Family education, Self Care, Joint mobilization, Stair training, DME instructions, Cryotherapy, Moist heat, Taping, Manual therapy, and Re-evaluation  PLAN FOR NEXT SESSION:  Check LTGs. ? Recert vs discharge to ortho clinic for hip orders     Willow Ora, PTA, [place] [address] [place], [place] [address] [phone] 11/01/21, 4:25 PM

## 2021-11-06 ENCOUNTER — Ambulatory Visit: Payer: Medicare PPO | Admitting: Physical Therapy

## 2021-11-08 ENCOUNTER — Ambulatory Visit: Payer: Medicare PPO | Admitting: Physical Therapy

## 2021-11-09 ENCOUNTER — Ambulatory Visit: Payer: Medicare PPO | Admitting: Physical Therapy

## 2021-11-09 DIAGNOSIS — R293 Abnormal posture: Secondary | ICD-10-CM

## 2021-11-09 DIAGNOSIS — R2689 Other abnormalities of gait and mobility: Secondary | ICD-10-CM

## 2021-11-09 DIAGNOSIS — M25552 Pain in left hip: Secondary | ICD-10-CM

## 2021-11-09 DIAGNOSIS — R2681 Unsteadiness on feet: Secondary | ICD-10-CM

## 2021-11-09 DIAGNOSIS — M67959 Unspecified disorder of synovium and tendon, unspecified thigh: Secondary | ICD-10-CM

## 2021-11-09 DIAGNOSIS — M6281 Muscle weakness (generalized): Secondary | ICD-10-CM

## 2021-11-09 DIAGNOSIS — M545 Low back pain, unspecified: Secondary | ICD-10-CM

## 2021-11-09 NOTE — Therapy (Signed)
OUTPATIENT PHYSICAL THERAPY NEURO TREATMENT-RECERTIFICATION   Patient Name: Gary [name] MRN: [id number] DOB:09/10/1932, 88 y.o., male Today's Date: 11/09/2021   PCP: Clovia Cuff, MD REFERRING PROVIDER: Gentry Fitz, MD    PT End of Session - 11/09/21 1445     Visit Number 9    Number of Visits 25   17+40, recertification   Date for PT Re-Evaluation 02/01/22   to allow for scheduling deficits   Authorization Type [employer]    Authorization Time Period 13 visits 09/25/21-11/06/21    Authorization - Visit Number 8    Authorization - Number of Visits 13    Progress Note Due on Visit 10    PT Start Time 1440    PT Stop Time 1525    PT Time Calculation (min) 45 min    Equipment Utilized During Treatment --    Activity Tolerance Patient limited by pain   in L hip during gait   Behavior During Therapy [facility] for tasks assessed/performed                Past Medical History:  Diagnosis Date   Adenomatous colon polyp    2010    Anemia    Anxiety    Arthritis    "hips and lower back"   Arthritis pain    Atypical mole 06/21/2003   left neck, inferior - slight to moderate atypia   Basal cell carcinoma 10/04/2014   right shin bcc (tx cx3, 28f )   Blood transfusion    Chest pain 03/29/2011   2D Echo without contrast on 03/29/11 - EF= 55-60%.   Cholecystitis    Chronic back pain greater than 3 months duration    Chronic kidney disease    Colon polyps    Complication of anesthesia    once slow to wake up   Coronary angioplasty status 2011   Coronary artery disease    Diarrhea    Diverticulosis    Dyslipidemia    GERD (gastroesophageal reflux disease)    GI bleeding after 07/2009   "triggered by Plavix & ASA; from my diverticulosis"   H/O Clostridium difficile infection    H/O myocardial perfusion scan 04/04/2011   For Pre-noncardiac surgery - EF = 67%, no ischemia, and is considered low risk.   Heart attack ([facility] 07/2009   nonSTEMI with an occluded  circumflex artery and collaterals.   Heart murmur    Hemorrhoids    History of shingles    Hyperlipidemia    Hypertension    Kidney stones    Lower extremity edema    Taking furosemide and it seems to be helping.   Neuromuscular disorder ([place]    Nodular infiltrative basal cell carcinoma (BCC) 02/08/2021   Right Forearm-anterior (tx p bx)   Nonspecific chest pain 08/26/2012   Went to ED 08/26/12   Parkinsonism ([place]    SCCA (squamous cell carcinoma) of skin 02/08/2021   Right Dorsal Hand (well diff) (tx p bx)   Squamous cell carcinoma of skin 05/13/2000   Right outer forehead   Squamous cell carcinoma of skin 03/02/2001   Post crown - tx 03/19/2001   Squamous cell carcinoma of skin 09/01/2001   Left post auricular - tx 10/09/2001   Squamous cell carcinoma of skin 06/21/2003   Top right ear - tx 07/05/2003   Squamous cell carcinoma of skin 06/21/2003   Right upper arm - tx 07/05/2003   Squamous cell carcinoma of skin 10/12/2003   Left superior  ear lobe - MOHs   Squamous cell carcinoma of skin 12/15/2006   Right ear, superior - tx 01/26/2007   Squamous cell carcinoma of skin 03/13/2009   Right temple - tx 05/08/2009   Squamous cell carcinoma of skin 03/24/2013   Upper left forearm - tx p bx   Squamous cell carcinoma of skin 12/06/2013   Right forearm - CX3 + 5FU   Squamous cell carcinoma of skin 02/10/2014   Right hand - tx p bx   Squamous cell carcinoma of skin 10/04/2014   Right shin - tx p bx   Squamous cell carcinoma of skin 08/21/2016   Left hand - tx p bx   Squamous cell carcinoma of skin 01/01/2017   Right hand - tx 02/06/2017   Squamous cell carcinoma of skin 10/21/2017   Left side of scalp - tx 03/12/2018   Squamous cell carcinoma of skin 10/21/2017   Left post scalp, inf - tx 08/13/2018   Squamous cell carcinoma of skin 10/21/2017   Right scalp - tx p bx   Squamous cell carcinoma of skin 10/21/2017   Left cheek - tx 03/12/2018   Squamous cell carcinoma of  skin 08/13/2018   Left top hand, proximal pinky - tx p bx   Squamous cell carcinoma of skin 12/23/2018   Right bicep lower - tx p bx   Squamous cell carcinoma of skin 05/18/2019   Right 4th finger metacarpophalangeal joint   Squamous cell carcinoma of skin 05/18/2019   Left 5th finger metacarpophalangeal joint   Squamous cell carcinoma of skin 05/18/2019   Left 2nd finger metacarpophalangeal joint   Stomach problems    Syncope and collapse 04/16/2018   Past Surgical History:  Procedure Laterality Date   [place]  08/09/2009   Circumflex 100% occluded with right-to-left collaterals, mild irregularities in the proximal and distal LAD and diagonal system, normal LV systolic function, and minor infrarenal irregularities, but no abdominal aortic aneurysm.   CHOLECYSTECTOMY  04/12/2011   Procedure: CHOLECYSTECTOMY;  Surgeon: Adin Hector, MD;  Location: [place];  Service: General;  Laterality: N/A;   CHOLECYSTECTOMY  04/12/2011   Procedure: LAPAROSCOPIC CHOLECYSTECTOMY;  Surgeon: Adin Hector, MD;  Location: [place];  Service: General;  Laterality: N/A;  converted to open   COLON SURGERY     COLONOSCOPY   ESOPHAGOGASTRODUODENOSCOPY N/A 08/05/2013   Procedure: ESOPHAGOGASTRODUODENOSCOPY (EGD);  Surgeon: Jerene Bears, MD;  Location: [place];  Service: Gastroenterology;  Laterality: N/A;   EYE SURGERY  1942   "right eye was crossed; they  corrected it"   LUMBAR FUSION  07/22/2012   PACEMAKER IMPLANT N/A 04/17/2018   Procedure: PACEMAKER IMPLANT;  Surgeon: Deboraha Sprang, MD;  Location: [place] [facility];  Service: Cardiovascular;  Laterality: N/A;   POSTERIOR FUSION LUMBAR SPINE  08/1995   "bottom 4 vertebra"   SUBTOTAL COLECTOMY  01/24/2010   TOTAL HIP ARTHROPLASTY Right 08/03/2013   Procedure: TOTAL HIP ARTHROPLASTY ANTERIOR APPROACH;  Surgeon: Hessie Dibble, MD;  Location: [place];  Service: Orthopedics;  Laterality: Right;   Patient Active Problem  List   Diagnosis Date Noted   Constipation 08/12/2021   Perforated bowel ([place]) 08/11/2021   Thrombocytopenia ([place]) 08/11/2021   Mood disorder ([place]) 08/11/2021   Chronic back pain greater than 3 months duration    Pneumoperitoneum    Vitamin B12 deficiency 04/15/2019   Abnormal glucose 04/15/2019   Stage 4 chronic kidney disease ([place])  04/15/2019   Dyspnea on exertion 04/15/2019   Pulmonary hypertension ([place]) 04/08/2019   Chest pain 03/27/2019   Complete heart block (HCC) 04/17/2018   Syncope and collapse 04/16/2018   Sinus pause 04/16/2018   Snoring 10/03/2017   Other fatigue 10/03/2017   Pre-syncope 05/14/2016   Murmur 05/14/2016   CAD in native artery 10/31/2015   History of MI (myocardial infarction) 10/31/2015   Preoperative cardiovascular examination 12/26/2014   History of colonic polyps 01/17/2014   PAF (paroxysmal atrial fibrillation) (HCC) 17/51/0258   Metabolic acidosis 52/77/8242   Acute renal failure ([place]) 08/13/2013   Stage 3b chronic kidney disease ([place]) 08/13/2013   Anemia in chronic kidney disease 08/13/2013   Hypokalemia 08/12/2013   Acute blood loss anemia 08/09/2013   Encephalopathy 08/09/2013   Hypotension, unspecified 08/07/2013   Complex renal cyst 08/07/2013   Renal mass, left 08/06/2013   Acute esophagitis 08/05/2013   Urinary retention 08/04/2013   S/P total hip arthroplasty 08/03/2013   Degenerative joint disease (DJD) of hip 08/03/2013    Class: Chronic   Internal hemorrhoids with other complication 35/36/1443   Tinnitus 10/22/2012   C. difficile colitis 07/26/2012   Ileus, postoperative ([place]) 07/24/2012   Somnolence 07/24/2012   Anxiety state 07/24/2012   Chest pain- ER 08/26/12 03/30/2011   Fever 03/28/2011   Leucocytosis 03/28/2011   CKD (chronic kidney disease), stage III ([place]) 03/28/2011   Hyponatremia 02/02/2011   BPH (benign prostatic hyperplasia) 02/01/2011   Cholecystitis chronic, acute 01/26/2011   Dyslipidemia 01/22/2011    Abdominal bloating 08/07/2010   Loose stools 08/07/2010   Coronary atherosclerosis 10/26/2009   Essential hypertension 06/07/2009   DIVERTICULOSIS OF COLON 10/31/2008   DIVERTICULOSIS, COLON, WITH HEMORRHAGE, surg 12/11  10/31/2008   Personal history of colonic polyps 10/31/2008    ONSET DATE: 09/19/2021   REFERRING DIAG: R26.9 (ICD-10-CM) - Abnormality of gait and mobility    THERAPY DIAG:  Unsteadiness on feet  Muscle weakness (generalized)  Other abnormalities of gait and mobility  Abnormal posture  Chronic right-sided low back pain, unspecified whether sciatica present  Pain in left hip  Tendinopathy involving hip  Rationale for Evaluation and Treatment Rehabilitation  PERTINENT HISTORY: anxiety, arthritis, basal cell carcinoma, back pain, chronic kidney disease, CAD, MI, heart murmur, HTN, lumbar fusion, pacemaker, right THR, mild memory loss, type 2 diabetes mellitus, heart block with pacer, chronic diastolic CHF, OSA with CPAP intolerance, and subtotal colectomy bleeding in 2011    PRECAUTIONS: Fall and ICD/Pacemaker   PATIENT GOALS: "I want to get to where I can be confident with a cane", "move better"  SUBJECTIVE:   Pt presents today with new MD order to address left greater trochanter bursitis, hip abductor tendonopathy. He reports he is still feeling positive effects of L hip injection from a few weeks ago. Pt rates his current pain as 5-6/10 at rest, gets up to 10/10 when he is up and walking on it. Pt also reports his L hip pain radiates down his buttocks and into his LLE to his knee as well as up to his L arm at times.  Pt accompanied by: self  PAIN:  Are you having pain? Yes: NPRS scale: 6/10 Pain location: L hip down buttocks and into L leg Pain description: dull mostly, sometimes sharp Relieves pain: tylenol, pain medication (Tramadol), resting Aggravates pain: walking   OBJECTIVE:  TODAY'S TREATMENT:  THER ACT:  [facility] PT Assessment - 11/09/21  1501       Ambulation/Gait   Gait  velocity 32.8' over 22 sec = 1.49 ft/sec      Standardized Balance Assessment   Standardized Balance Assessment Timed Up and Go Test;Five Times Sit to Stand;10 meter walk test    Five times sit to stand comments  25.04 sec   BUE support on arms of chair     Timed Up and Go Test   TUG Normal TUG    Normal TUG (seconds) 21   with RW           6MWT: 360 ft, needs seated rest break at 4:00 min-5:30 min and able to continue for remainder of test    PATIENT EDUCATION: Education details: OM findings, POC, discontinue previous HEP and new one will be established next session Person educated: Patient Education method: Explanation Education comprehension: verbalized understanding   HOME EXERCISE PROGRAM: To be re-established next session   GOALS: Goals reviewed with patient? Yes  SHORT TERM GOALS: Target date: 10/16/2021  Initiate HEP Baseline: 10/16/21: met with current program Goal status: MET  2.  Pt will improve 5 x STS to less than or equal to 20 seconds to demonstrate improved functional strength and transfer efficiency.  Baseline: 10/16/21: 18.78 sec's  Goal status: MET  3.  Pt will improve normal TUG to less than or equal to 30 seconds for improved functional mobility and decreased fall risk. Baseline:  10/16/21: 23.81 with RW Goal status: MET  4.  Pt will improve gait velocity to at least 1.5 ft/sec for improved gait efficiency and performance at mod I level Baseline: 10/18/21: 1.56 ft/sec Goal status: MET  5.  Pt will improve score on 6MWT to 450 ft with LRAD with no seated rest breaks to demonstrate improved endurance Baseline:10/16/21: 514 feet with RW with no rest breaks  Goal status: MET   LONG TERM GOALS: Target date: 11/06/2021  Pt will be independent with final HEP for improved strength, balance, transfers and gait. Baseline: not established at eval Goal status: NOT MET  2.  Pt will improve 5 x STS to less than or equal to  15 seconds to demonstrate improved functional strength and transfer efficiency.  Baseline: 25.28 sec (8/15), 15.06 sec (9/21), 25.04 sec (9/29) Goal status: NOT MET  3.  Pt will improve normal TUG to less than or equal to 25 seconds for improved functional mobility and decreased fall risk. Baseline: 22.66 sec (9/21), 21 sec (9/29) Goal status: MET  4.  Pt will improve gait velocity to at least 2.0 ft/sec for improved gait efficiency and performance at mod I level  Baseline: 1.09 ft/sec (8/15), 1.38 ft/sec (9/21), 1.49 ft/sec (9/29) Goal status: PARTIALLY MET  5.  Pt will improve score on 6MWT to 500 ft with LRAD with no seated rest break to demonstrate improved endurance Baseline: 334 ft with RW with one seated rest break (8/21), 360 ft with RW and one seated rest break (9/29) Goal status: NOT MET   NEW SHORT-TERM GOALS: Target date: 12/21/2021  Pt will be independent with initial HEP for improved strength, balance, transfers and gait. Baseline: not established Goal status: INITIAL  NEW LONG-TERM GOALS: Target date: 02/01/2022  Pt will be independent with final HEP for improved strength, balance, transfers and gait. Baseline: not established at eval Goal status: INITIAL  2.  Pt will improve 5 x STS to less than or equal to 15 seconds to demonstrate improved functional strength and transfer efficiency.  Baseline: 25.28 sec (8/15), 15.06 sec (9/21), 25.04 sec (9/29) Goal status: UPDATED/REVISED  3.  Pt will improve normal TUG to less than or equal to 19 seconds for improved functional mobility and decreased fall risk. Baseline: 22.66 sec (9/21), 21 sec (9/29) Goal status: UPDATED/REVISED  4.  Pt will improve gait velocity to at least 2.0 ft/sec for improved gait efficiency and performance at mod I level  Baseline: 1.09 ft/sec (8/15), 1.38 ft/sec (9/21), 1.49 ft/sec (9/29) Goal status: IN PROGRESS  5.  Pt will improve score on 6MWT to 500 ft with LRAD with no seated rest break  to demonstrate improved endurance Baseline: 334 ft with RW with one seated rest break (8/21), 360 ft with RW and one seated rest break (9/29) Goal status: IN PROGRESS   ASSESSMENT:  CLINICAL IMPRESSION: Emphasis of skilled PT session on reassessing current LTG and creating new STG and LTG for recertification of PT services. Pt has only met 1/5 LTG due to new onset of L hip bursitis and pain being limiting for his ability to perform transfers and ambulation in a pain-free manner. Pt has improved his TUG score from 22.66 sec initially to 21 sec this date, therefore meeting his goal of getting score under 25 sec. Pt has been unable to perform his HEP due to it causing pain (d/c previous HEP and will establish new HEP at next visit), exhibited a worse score on his 5xSTS from 15.06 sec last session to 25.04 sec this date due to pain, improved his gait speed from 1.09 ft/sec initially to 1.49 ft/sec this date though did not meet his goal of 2.0 ft/sec, and improved his distance on the 6MWT from 334 ft to 360 ft with RW and one seated rest break, but did not meet his goal of covering 500 ft distance during test. Pain remains patient's biggest limiting factor at this time. Pt will benefit from skilled therapy services to address pain in L hip due to bursitis in order to increase his ability to perform functional mobility independently and safely.  At end of therapy session pt reports he is unable to return to PT until 12/04/2021, discussed with patient that he will need to return to PT within 30 days or he will be d/c from therapy and will need a new referral to be seen. Pt understanding of education and will call to schedule appointment once he knows his schedule for the next month.   OBJECTIVE IMPAIRMENTS Abnormal gait, decreased activity tolerance, decreased balance, decreased cognition, decreased endurance, decreased mobility, difficulty walking, decreased strength, decreased safety awareness, impaired  sensation, and postural dysfunction.   ACTIVITY LIMITATIONS carrying, lifting, bending, standing, squatting, and stairs  PARTICIPATION LIMITATIONS: cleaning, laundry, driving, community activity, and church  PERSONAL FACTORS Age, Fitness, Transportation, and 3+ comorbidities:    anxiety, arthritis, basal cell carcinoma, back pain, chronic kidney disease, CAD, MI, heart murmur, HTN, lumbar fusion, pacemaker, right THR, mild memory loss, type 2 diabetes mellitus, heart block with pacer, chronic diastolic CHF, OSA with CPAP intolerance, and subtotal colectomy bleeding in 2011 are also affecting patient's functional outcome.   REHAB POTENTIAL: Good  CLINICAL DECISION MAKING: Evolving/moderate complexity  EVALUATION COMPLEXITY: Moderate  PLAN: PT FREQUENCY: 2x/week  PT DURATION: 6 weeks+ 12 visits (recertification)  PLANNED INTERVENTIONS: Therapeutic exercises, Therapeutic activity, Neuromuscular re-education, Balance training, Gait training, Patient/Family education, Self Care, Joint mobilization, Stair training, DME instructions, Cryotherapy, Moist heat, Taping, Manual therapy, and Re-evaluation  PLAN FOR NEXT SESSION:  Initiate HEP for endurance/strengthening, pain management of L hip bursitis, assess goals based on return to therapy date  Excell Seltzer, PT, DPT, [facility] [address] [place], [place] [address] [phone] 11/09/21, 3:30 PM

## 2021-11-11 ENCOUNTER — Other Ambulatory Visit: Payer: Self-pay | Admitting: Internal Medicine

## 2021-12-03 ENCOUNTER — Ambulatory Visit: Payer: Medicare PPO | Admitting: Physical Therapy

## 2021-12-06 ENCOUNTER — Ambulatory Visit: Payer: Medicare PPO | Admitting: Physical Therapy

## 2021-12-28 ENCOUNTER — Ambulatory Visit (INDEPENDENT_AMBULATORY_CARE_PROVIDER_SITE_OTHER): Payer: Medicare PPO

## 2021-12-28 DIAGNOSIS — I442 Atrioventricular block, complete: Secondary | ICD-10-CM | POA: Diagnosis not present

## 2021-12-28 LAB — CUP PACEART REMOTE DEVICE CHECK
Battery Remaining Longevity: 97 mo
Battery Voltage: 2.99 V
Brady Statistic AP VP Percent: 50.59 %
Brady Statistic AP VS Percent: 0.01 %
Brady Statistic AS VP Percent: 49.29 %
Brady Statistic AS VS Percent: 0.12 %
Brady Statistic RA Percent Paced: 50.3 %
Brady Statistic RV Percent Paced: 99.87 %
Date Time Interrogation Session: 20231117005617
Implantable Lead Connection Status: 753985
Implantable Lead Connection Status: 753985
Implantable Lead Implant Date: 20200306
Implantable Lead Implant Date: 20200306
Implantable Lead Location: 753859
Implantable Lead Location: 753860
Implantable Lead Model: 5076
Implantable Lead Model: 5076
Implantable Pulse Generator Implant Date: 20200306
Lead Channel Impedance Value: 323 Ohm
Lead Channel Impedance Value: 361 Ohm
Lead Channel Impedance Value: 361 Ohm
Lead Channel Impedance Value: 418 Ohm
Lead Channel Pacing Threshold Amplitude: 0.5 V
Lead Channel Pacing Threshold Amplitude: 0.5 V
Lead Channel Pacing Threshold Pulse Width: 0.4 ms
Lead Channel Pacing Threshold Pulse Width: 0.4 ms
Lead Channel Sensing Intrinsic Amplitude: 1.25 mV
Lead Channel Sensing Intrinsic Amplitude: 1.25 mV
Lead Channel Sensing Intrinsic Amplitude: 8.625 mV
Lead Channel Sensing Intrinsic Amplitude: 8.625 mV
Lead Channel Setting Pacing Amplitude: 1.5 V
Lead Channel Setting Pacing Amplitude: 2.5 V
Lead Channel Setting Pacing Pulse Width: 0.4 ms
Lead Channel Setting Sensing Sensitivity: 0.6 mV
Zone Setting Status: 755011
Zone Setting Status: 755011

## 2022-01-14 ENCOUNTER — Ambulatory Visit: Payer: Medicare PPO | Admitting: Podiatry

## 2022-01-16 NOTE — Progress Notes (Signed)
Remote pacemaker transmission.   

## 2022-01-22 ENCOUNTER — Encounter (HOSPITAL_COMMUNITY): Payer: Self-pay

## 2022-01-22 ENCOUNTER — Emergency Department (HOSPITAL_COMMUNITY)
Admission: EM | Admit: 2022-01-22 | Discharge: 2022-01-22 | Disposition: A | Discharge status: Home / Self Care | Payer: Medicare PPO | Attending: Emergency Medicine | Admitting: Emergency Medicine

## 2022-01-22 DIAGNOSIS — I5042 Chronic combined systolic (congestive) and diastolic (congestive) heart failure: Secondary | ICD-10-CM | POA: Diagnosis not present

## 2022-01-22 DIAGNOSIS — Z85828 Personal history of other malignant neoplasm of skin: Secondary | ICD-10-CM | POA: Diagnosis not present

## 2022-01-22 DIAGNOSIS — Z7982 Long term (current) use of aspirin: Secondary | ICD-10-CM | POA: Diagnosis not present

## 2022-01-22 DIAGNOSIS — Y900 Blood alcohol level of less than 20 mg/100 ml: Secondary | ICD-10-CM | POA: Insufficient documentation

## 2022-01-22 DIAGNOSIS — I130 Hypertensive heart and chronic kidney disease with heart failure and stage 1 through stage 4 chronic kidney disease, or unspecified chronic kidney disease: Secondary | ICD-10-CM | POA: Insufficient documentation

## 2022-01-22 DIAGNOSIS — Z950 Presence of cardiac pacemaker: Secondary | ICD-10-CM | POA: Insufficient documentation

## 2022-01-22 DIAGNOSIS — Z91040 Latex allergy status: Secondary | ICD-10-CM | POA: Insufficient documentation

## 2022-01-22 DIAGNOSIS — I2510 Atherosclerotic heart disease of native coronary artery without angina pectoris: Secondary | ICD-10-CM | POA: Insufficient documentation

## 2022-01-22 DIAGNOSIS — R404 Transient alteration of awareness: Secondary | ICD-10-CM | POA: Insufficient documentation

## 2022-01-22 DIAGNOSIS — Z794 Long term (current) use of insulin: Secondary | ICD-10-CM | POA: Diagnosis not present

## 2022-01-22 DIAGNOSIS — Z20822 Contact with and (suspected) exposure to covid-19: Secondary | ICD-10-CM | POA: Insufficient documentation

## 2022-01-22 DIAGNOSIS — G20C Parkinsonism, unspecified: Secondary | ICD-10-CM | POA: Insufficient documentation

## 2022-01-22 DIAGNOSIS — Z79899 Other long term (current) drug therapy: Secondary | ICD-10-CM | POA: Diagnosis not present

## 2022-01-22 DIAGNOSIS — E1122 Type 2 diabetes mellitus with diabetic chronic kidney disease: Secondary | ICD-10-CM | POA: Diagnosis not present

## 2022-01-22 DIAGNOSIS — R4182 Altered mental status, unspecified: Secondary | ICD-10-CM | POA: Diagnosis present

## 2022-01-22 DIAGNOSIS — N189 Chronic kidney disease, unspecified: Secondary | ICD-10-CM | POA: Insufficient documentation

## 2022-01-22 LAB — COMPREHENSIVE METABOLIC PANEL
ALT: 14 U/L (ref 0–44)
AST: 21 U/L (ref 15–41)
Albumin: 4 g/dL (ref 3.5–5.0)
Alkaline Phosphatase: 35 U/L — ABNORMAL LOW (ref 38–126)
Anion gap: 10 (ref 5–15)
BUN: 20 mg/dL (ref 8–23)
CO2: 27 mmol/L (ref 22–32)
Calcium: 9.3 mg/dL (ref 8.9–10.3)
Chloride: 104 mmol/L (ref 98–111)
Creatinine, Ser: 1.71 mg/dL — ABNORMAL HIGH (ref 0.61–1.24)
GFR, Estimated: 38 mL/min — ABNORMAL LOW (ref >60)
Glucose, Bld: 157 mg/dL — ABNORMAL HIGH (ref 70–99)
Potassium: 4.2 mmol/L (ref 3.5–5.1)
Sodium: 141 mmol/L (ref 135–145)
Total Bilirubin: 0.7 mg/dL (ref 0.3–1.2)
Total Protein: 6.5 g/dL (ref 6.5–8.1)

## 2022-01-22 LAB — CBC WITH DIFFERENTIAL/PLATELET
Abs Immature Granulocytes: 0.04 10*3/uL (ref 0.00–0.07)
Basophils Absolute: 0 10*3/uL (ref 0.0–0.1)
Basophils Relative: 1 %
Eosinophils Absolute: 0.2 10*3/uL (ref 0.0–0.5)
Eosinophils Relative: 3 %
HCT: 38.5 % — ABNORMAL LOW (ref 39.0–52.0)
Hemoglobin: 13.1 g/dL (ref 13.0–17.0)
Immature Granulocytes: 1 %
Lymphocytes Relative: 11 %
Lymphs Abs: 0.7 10*3/uL (ref 0.7–4.0)
MCH: 30 pg (ref 26.0–34.0)
MCHC: 34 g/dL (ref 30.0–36.0)
MCV: 88.3 fL (ref 80.0–100.0)
Monocytes Absolute: 0.5 10*3/uL (ref 0.1–1.0)
Monocytes Relative: 7 %
Neutro Abs: 5.1 10*3/uL (ref 1.7–7.7)
Neutrophils Relative %: 77 %
Platelets: 158 10*3/uL (ref 150–400)
RBC: 4.36 MIL/uL (ref 4.22–5.81)
RDW: 13 % (ref 11.5–15.5)
WBC: 6.5 10*3/uL (ref 4.0–10.5)
nRBC: 0 % (ref 0.0–0.2)

## 2022-01-22 LAB — URINALYSIS, ROUTINE W REFLEX MICROSCOPIC
Bilirubin Urine: NEGATIVE
Glucose, UA: NEGATIVE mg/dL
Hgb urine dipstick: NEGATIVE
Ketones, ur: NEGATIVE mg/dL
Leukocytes,Ua: NEGATIVE
Nitrite: NEGATIVE
Protein, ur: NEGATIVE mg/dL
Specific Gravity, Urine: 1.013 (ref 1.005–1.030)
pH: 6 (ref 5.0–8.0)

## 2022-01-22 LAB — RAPID URINE DRUG SCREEN, HOSP PERFORMED
Amphetamines: NOT DETECTED
Barbiturates: NOT DETECTED
Benzodiazepines: NOT DETECTED
Cocaine: NOT DETECTED
Opiates: NOT DETECTED
Tetrahydrocannabinol: NOT DETECTED

## 2022-01-22 LAB — PROTIME-INR
INR: 1 (ref 0.8–1.2)
Prothrombin Time: 13.5 seconds (ref 11.4–15.2)

## 2022-01-22 LAB — MAGNESIUM: Magnesium: 2.1 mg/dL (ref 1.7–2.4)

## 2022-01-22 LAB — RESP PANEL BY RT-PCR (RSV, FLU A&B, COVID)  RVPGX2
Influenza A by PCR: NEGATIVE
Influenza B by PCR: NEGATIVE
Resp Syncytial Virus by PCR: NEGATIVE
SARS Coronavirus 2 by RT PCR: NEGATIVE

## 2022-01-22 LAB — LACTIC ACID, PLASMA: Lactic Acid, Venous: 1.4 mmol/L (ref 0.5–1.9)

## 2022-01-22 LAB — TROPONIN I (HIGH SENSITIVITY)
Troponin I (High Sensitivity): 17 ng/L (ref <18)
Troponin I (High Sensitivity): 18 ng/L — ABNORMAL HIGH (ref <18)

## 2022-01-22 LAB — ETHANOL: Alcohol, Ethyl (B): <10 mg/dL (ref <10)

## 2022-01-22 LAB — APTT: aPTT: 26 seconds (ref 24–36)

## 2022-01-22 LAB — ACETAMINOPHEN LEVEL: Acetaminophen (Tylenol), Serum: <10 ug/mL — ABNORMAL LOW (ref 10–30)

## 2022-01-22 LAB — SALICYLATE LEVEL: Salicylate Lvl: <7 mg/dL — ABNORMAL LOW (ref 7.0–30.0)

## 2022-01-22 NOTE — Discharge Instructions (Addendum)
Thank you for coming to [facility] Emergency Department. You were seen for transient alteration of awareness, confusion, unresponsiveness. We did an exam, labs, and imaging, and these showed no acute findings.  Please follow up with your primary care provider within 1 week.   Do not hesitate to return to the ED or call 911 if you experience: -Worsening symptoms -CP, SOB -Lightheadedness, passing out -Fevers/chills -Anything else that concerns you

## 2022-01-22 NOTE — ED Triage Notes (Signed)
Pt BIB EMS following difficulty arousing pt this am. Pt c/o feeling weak, sleepy. Pt recently had skin cancer removed off of left shin. Pt baseline A/O X4. Pacemaker.

## 2022-01-22 NOTE — ED Provider Notes (Addendum)
[facility] Provider Note   CSN: [id number] Arrival date & time: 01/22/22  1034     History  Chief Complaint  Patient presents with   Altered Mental Status   Weakness    Gary [name] is a 88 y.o. male with HTN, T2DM not on insulin, BPH, CAD stage IV, history C. difficile, proximal A-fib, pulmonary hypertension, parkinsonism, anemia of chronic disease, CAD with history of MI, obesity, chronic combined systolic/diastolic heart failure, mood disorder presents with altered mental status.  History provided by patient and his wife at bedside.  Patient was difficult to arouse this morning per wife.  She stated that he was on arousable, unresponsive.  This is never happened before.  She states that he told her to "take care of his story," and that he did not think he was going to "make it."  There was no loss of bladder/bowel, no shaking or seizure-like activity observed during the episode. Also was no facial droop, slurred speech, asymmetric weakness observed. He has not had any fever/chills, shortness of breath, chest pain, palpitations, abdominal pain, dysuria/hematuria, melena/hematochezia, diarrhea/constipation.  He has endorsed nausea over the last couple of days and had not eaten anything yet this morning.  Has not had any recent changes to his medications, does not take any insulin.  The only thing that has happened over the last couple of weeks is that he recently had a skin cancer removed off of his left shin and had the dressing changed yesterday which the wife stated was "a traumatic experience."  There was blood crusted on the bandage and the removal was painful. Wife states that how the wound looks today is exactly how it looked yesterday and the dermatologist thought it looked great and was pleased with the healing.  Patient does have a pacemaker.  Wife states that patient is now at his baseline at bedside.  She is not sure how long the altered  mental status lasted because she called 911 and when he was put in the ambulance he was still altered but when she arrived by POV at the ED and saw him again he was back at his baseline.  Patient states he has a history of rectal bleeding but many years ago but none recently. Denies alcohol or drug use. States he is ready to be discharged because he feels his normal self.    Altered Mental Status Associated symptoms: weakness   Weakness      Home Medications Prior to Admission medications   Medication Sig Start Date End Date Taking? Authorizing Provider  acetaminophen (TYLENOL) 500 MG tablet Take 1,000 mg by mouth every 8 (eight) hours as needed for headache (pain).    [provider]  amLODipine (NORVASC) 5 MG tablet TAKE 1 TABLET BY MOUTH EVERY DAY 11/12/21   Hilty, Nadean Corwin, MD  aspirin EC 81 MG tablet Take 81 mg by mouth every morning.    [provider]  blood glucose meter kit and supplies KIT Dispense based on patient and insurance preference. Use up to four times daily as directed. (FOR ICD-9 250.00, 250.01). 03/29/19   Eulogio Bear U, DO  carvedilol (COREG) 3.125 MG tablet TAKE 1 TABLET (3.125 MG TOTAL) BY MOUTH 2 (TWO) TIMES DAILY WITH A MEAL. Patient taking differently: Take 3.125 mg by mouth 2 (two) times daily. 10/09/20   Hilty, Nadean Corwin, MD  citalopram (CELEXA) 40 MG tablet Take 1 tablet (40 mg total) by mouth daily. Patient taking differently:  Take 40 mg by mouth every morning. 08/19/19   Minette Brine, FNP  doxazosin (CARDURA) 4 MG tablet Take 4 mg by mouth at bedtime.    [provider]  fluticasone (FLONASE) 50 MCG/ACT nasal spray Place 2 sprays into both nostrils daily.    [provider]  furosemide (LASIX) 40 MG tablet Take 1 tablet (40 mg total) by mouth every Monday, Wednesday, and Friday. 08/15/21   Lavina Hamman, MD  nitroGLYCERIN (NITROSTAT) 0.4 MG SL tablet PLACE 1 TABLET UNDER THE TONGUE EVERY 5 MINUTES AS NEEDED FOR CHEST PAIN FOR  3 DOSES Patient not taking: Reported on 08/11/2021 10/25/16   Pixie Casino, MD  pregabalin (LYRICA) 75 MG capsule Take 1 capsule (75 mg total) by mouth 3 (three) times daily. 09/08/19   Minette Brine, FNP  simvastatin (ZOCOR) 40 MG tablet TAKE 1 TABLET BY MOUTH AT BEDTIME Patient taking differently: Take 40 mg by mouth at bedtime. 03/18/16   Hilty, Nadean Corwin, MD  traMADol (ULTRAM) 50 MG tablet Take 50 mg by mouth 3 (three) times daily. 10/21/19   [provider]  vitamin B-12 1000 MCG tablet Take 1 tablet (1,000 mcg total) by mouth daily. Patient taking differently: Take 1,000 mcg by mouth daily with lunch. 03/29/19   Geradine Girt, DO      Allergies    Naproxen, Clopidogrel bisulfate, Sulfonamide derivatives, Other, Latex, and Tape    Review of Systems   Review of Systems  Neurological:  Positive for weakness.   Review of systems Negative for f/c.  A 10 point review of systems was performed and is negative unless otherwise reported in HPI.  Physical Exam Updated Vital Signs BP (!) 153/70   Pulse 70   Temp 98 F (36.7 C) (Oral)   Resp 16   Ht _0  (1.626 m)   Wt 89 kg   SpO2 95%   BMI 33.68 kg/m  Physical Exam General: Normal appearing male, lying in bed.  HEENT: PERRLA, EOMI, Sclera anicteric, MMM, trachea midline.  Cardiology: RRR, no murmurs/rubs/gallops. BL radial and DP pulses equal bilaterally.  Resp: Normal respiratory rate and effort. CTAB, no wheezes, rhonchi, crackles.  Abd: Soft, non-tender, non-distended. No rebound tenderness or guarding.  GU: Deferred. MSK: No peripheral edema or signs of trauma. Extremities without deformity or TTP. No cyanosis or clubbing. Small 2x2 cm wound on left anterior shin, some surrounding granulation tissue, no purulent drainage, fluctuance or induration around the wound.  Skin: warm, dry. No rashes or lesions. Back: No CVA tenderness Neuro: A&Ox4, CNs II-XII grossly intact. 5/5 strength in all extremities. Sensation grossly  intact. Tongue protrudes midline, normal speech, normal coordination. Psych: Normal mood and affect.   ED Results / Procedures / Treatments   Labs (all labs ordered are listed, but only abnormal results are displayed) Labs Reviewed  CBC WITH DIFFERENTIAL/PLATELET - Abnormal; Notable for the following components:      Result Value   HCT 38.5 (*)    All other components within normal limits  COMPREHENSIVE METABOLIC PANEL - Abnormal; Notable for the following components:   Glucose, Bld 157 (*)    Creatinine, Ser 1.71 (*)    Alkaline Phosphatase 35 (*)    GFR, Estimated 38 (*)    All other components within normal limits  SALICYLATE LEVEL - Abnormal; Notable for the following components:   Salicylate Lvl <4.2 (*)    All other components within normal limits  ACETAMINOPHEN LEVEL - Abnormal; Notable for the following  components:   Acetaminophen (Tylenol), Serum <10 (*)    All other components within normal limits  TROPONIN I (HIGH SENSITIVITY) - Abnormal; Notable for the following components:   Troponin I (High Sensitivity) 18 (*)    All other components within normal limits  RESP PANEL BY RT-PCR (RSV, FLU A&B, COVID)  RVPGX2  MAGNESIUM  ETHANOL  URINALYSIS, ROUTINE W REFLEX MICROSCOPIC  PROTIME-INR  APTT  RAPID URINE DRUG SCREEN, HOSP PERFORMED  LACTIC ACID, PLASMA  TROPONIN I (HIGH SENSITIVITY)    EKG EKG Interpretation  Date/Time:  Tuesday January 22 2022 10:56:39 EST Ventricular Rate:  70 PR Interval:  288 QRS Duration: 183 QT Interval:  471 QTC Calculation: 509 R Axis:   -70 Text Interpretation: Sinus or ectopic atrial rhythm Prolonged PR interval LVH with IVCD, LAD and secondary repol abnrm Prolonged QT interval Similar to prior EKGs Reconfirmed by Cindee Lame [phone]) on 01/22/2022 11:00:09 AM  Radiology No results found.  Procedures Procedures    Medications Ordered in ED Medications - No data to display  ED Course/ Medical Decision Making/ A&P                           Medical Decision Making Amount and/or Complexity of Data Reviewed Labs: ordered. Decision-making details documented in ED Course.   This patient presents to the ED for concern of LOC, episode of unresponsiveness, this involves an extensive number of treatment options, and is a complaint that carries with it a high risk of complications and morbidity.  I considered the following differential and admission for this acute, potentially life threatening condition.   MDM:    Consider syncope vs seizure vs transient AMS. Patient was responsive during the episode and answering questions which seems to make generalized seizure less likely. Could still have had a focal seizure but there was no shaking or tremors during the episode. Consider presyncope vs syncope or AMS, by such causes as hypo/hyperglycemia, electrolyte abnormalities, hypovolemia/dehydration, anemia, infection such as UTI, arrhythmia/ACS, EtOH or other toxic ingestion, medication effects (though he's had no changes to his meds recently). Consider TIA though wife was present throughout the episode and reported only confusion/decreased responsiveness, no facial droop, slurred speech, or asymmetric weakness. He has no focal neuro deficits now and has had no head trauma, no headache, low c/f SAH/ICH. Wife at bedside states he is at his baseline now. The only thing that has happened to them recently was the dressing change of the wound on his left leg yesterday which was "traumatic." The wound now looks to be healing well and not acutely infected, no purulent drainage/induration/fluctuance, was just seen by dermatology yesterday who was pleased with the healing, and patient has had no fevers/chills or worsening symptoms/pain in the leg/wound.   Will check labs broadly. Patient is at his baseline and is already asking to be discharged because he states nothing is wrong with him.    Clinical Course as of 02/14/22 1140  Tue Jan 22, 2022  1542 Urinalysis, Routine w reflex microscopic Neg [HN]  1543 Rapid urine drug screen (hospital performed) Neg [HN]    Clinical Course User Index [HN] Audley Hose, MD     Labs: I Ordered, and personally interpreted labs.  The pertinent results include:  Glucose 157 mg/dL, Cr 1.71 (pts baseline 1.6-1.8), otherwise unremarkable CMP, No leukocytosis or anemia on CBC, Trop 17-18, PT/INR/PTT wnl. Neg acetaminophen/salicylate, Neg viral panel/UA/UDS. Lactate 1.4.   Additional history  obtained from wife at bedside, chart review.    Cardiac Monitoring: The patient was maintained on a cardiac monitor.  I personally viewed and interpreted the cardiac monitored which showed an underlying rhythm of: paced  Reevaluation: After the interventions noted above, I reevaluated the patient and found that they have :resolved  Social Determinants of Health: Patient lives independently with his wife  Disposition:  Lab w/u is unrevealing and patient has stayed at his baseline throughout his 6 hour stay in the ED. I discussed extensively with the patient and his wife at bedside the possibilities of what potentially could have occurred today including presyncope, hypoglycemic episode, seizure, or TIA, though the latter two seem less likely, as patient was responding appropriately to questions posed by the wife. The wife wonders if he was just very asleep and dreaming and so difficult to arouse. Regardless, patient is at his baseline now. I explained that we have had a very reassuring workup today but that he should follow up with his primary doctor to be reevaluated within the next week and they state they will call in the AM to make an appointment. All questions answered to their satisfaction, DC'd with discharge instructions/return precautions.   Co morbidities that complicate the patient evaluation  Past Medical History:  Diagnosis Date   Adenomatous colon polyp    2010    Anemia    Anxiety     Arthritis    "hips and lower back"   Arthritis pain    Atypical mole 06/21/2003   left neck, inferior - slight to moderate atypia   Basal cell carcinoma 10/04/2014   right shin bcc (tx cx3, 21f )   Blood transfusion    Chest pain 03/29/2011   2D Echo without contrast on 03/29/11 - EF= 55-60%.   Cholecystitis    Chronic back pain greater than 3 months duration    Chronic kidney disease    Colon polyps    Complication of anesthesia    once slow to wake up   Coronary angioplasty status 2011   Coronary artery disease    Diarrhea    Diverticulosis    Dyslipidemia    GERD (gastroesophageal reflux disease)    GI bleeding after 07/2009   "triggered by Plavix & ASA; from my diverticulosis"   H/O Clostridium difficile infection    H/O myocardial perfusion scan 04/04/2011   For Pre-noncardiac surgery - EF = 67%, no ischemia, and is considered low risk.   Heart attack [place] 07[place]   nonSTEMI with an occluded circumflex artery and collaterals.   Heart murmur    Hemorrhoids    History of shingles    Hyperlipidemia    Hypertension    Kidney stones    Lower extremity edema    Taking furosemide and it seems to be helping.   Neuromuscular disorder ([facility]    Nodular infiltrative basal cell carcinoma (BCC) 02/08/2021   Right Forearm-anterior (tx p bx)   Nonspecific chest pain 08/26/2012   Went to ED 08/26/12   Parkinsonism    SCCA (squamous cell carcinoma) of skin 02/08/2021   Right Dorsal Hand (well diff) (tx p bx)   Squamous cell carcinoma of skin 05/13/2000   Right outer forehead   Squamous cell carcinoma of skin 03/02/2001   Post crown - tx 03/19/2001   Squamous cell carcinoma of skin 09/01/2001   Left post auricular - tx 10/09/2001   Squamous cell carcinoma of skin 06/21/2003   Top right ear - tx 07/05/2003  Squamous cell carcinoma of skin 06/21/2003   Right upper arm - tx 07/05/2003   Squamous cell carcinoma of skin 10/12/2003   Left superior ear lobe - MOHs   Squamous cell  carcinoma of skin 12/15/2006   Right ear, superior - tx 01/26/2007   Squamous cell carcinoma of skin 03/13/2009   Right temple - tx 05/08/2009   Squamous cell carcinoma of skin 03/24/2013   Upper left forearm - tx p bx   Squamous cell carcinoma of skin 12/06/2013   Right forearm - CX3 + 5FU   Squamous cell carcinoma of skin 02/10/2014   Right hand - tx p bx   Squamous cell carcinoma of skin 10/04/2014   Right shin - tx p bx   Squamous cell carcinoma of skin 08/21/2016   Left hand - tx p bx   Squamous cell carcinoma of skin 01/01/2017   Right hand - tx 02/06/2017   Squamous cell carcinoma of skin 10/21/2017   Left side of scalp - tx 03/12/2018   Squamous cell carcinoma of skin 10/21/2017   Left post scalp, inf - tx 08/13/2018   Squamous cell carcinoma of skin 10/21/2017   Right scalp - tx p bx   Squamous cell carcinoma of skin 10/21/2017   Left cheek - tx 03/12/2018   Squamous cell carcinoma of skin 08/13/2018   Left top hand, proximal pinky - tx p bx   Squamous cell carcinoma of skin 12/23/2018   Right bicep lower - tx p bx   Squamous cell carcinoma of skin 05/18/2019   Right 4th finger metacarpophalangeal joint   Squamous cell carcinoma of skin 05/18/2019   Left 5th finger metacarpophalangeal joint   Squamous cell carcinoma of skin 05/18/2019   Left 2nd finger metacarpophalangeal joint   Stomach problems    Syncope and collapse 04/16/2018     Medicines No orders of the defined types were placed in this encounter.   I have reviewed the patients home medicines and have made adjustments as needed  Problem List / ED Course: Problem List Items Addressed This Visit   None Visit Diagnoses     Transient alteration of awareness    -  Primary          Audley Hose, MD 02/14/22 1208

## 2022-02-07 ENCOUNTER — Telehealth: Payer: Self-pay | Admitting: Diagnostic Neuroimaging

## 2022-02-07 ENCOUNTER — Encounter: Payer: Self-pay | Admitting: Diagnostic Neuroimaging

## 2022-02-07 NOTE — Telephone Encounter (Signed)
Please contact pt to get scheduled

## 2022-02-07 NOTE — Telephone Encounter (Signed)
Pt was called and after checking DPR a vm was left asking pt to call back so he could be scheduled for his needed appointment.  This is FYI to POD 3

## 2022-02-12 ENCOUNTER — Telehealth: Payer: Self-pay | Admitting: Diagnostic Neuroimaging

## 2022-02-12 NOTE — Telephone Encounter (Signed)
Please attempt to contact pt to get scheduled again, looks like pt was unable to reach 02/07/22

## 2022-02-12 NOTE — Telephone Encounter (Signed)
Pt's wife, [name] called his shaking has gotten worse and is falling more. Need a sooner than June 18.   Informed Ms. Whedbee nurse will call back if she has a sooner appt.

## 2022-02-14 NOTE — Telephone Encounter (Signed)
Phone rep left a vm to call and schedule an earlier appointment with NP (after DPR was checked). 1st attempt 12-28, today is 2nd attempt.  This is FYI for POD 3

## 2022-02-20 NOTE — Progress Notes (Deleted)
[facility]  PATIENT: Gary [name] DOB: 17-Mar-1932  REFERRING CLINICIAN: Clovia Cuff, MD HISTORY FROM: patient  REASON FOR VISIT: Worsening gait, shaking and memory   HISTORICAL  CHIEF COMPLAINT:  No chief complaint on file.   HISTORY OF PRESENT ILLNESS:   Update 02/21/2022 JM: Patient returns per request due to reports of worsening shaking, worsening memory and increased falls.    Consult visit 02/27/2021 Dr. Leta Baptist: 89 year old male here for evaluation of mild memory loss, muscle jerks, pain in legs.    Symptoms have been going on for at least past 1 to 2 years, possibly longer.  He has had generalized malaise, gait and balance difficulty worsening over the past several years.  He transition from walking independently to cane, walker.  He has had several falls.  Patient also has some hearing impairment and mild memory loss.  His long-term memory is quite good.  Sometimes he has some mild short-term memory problems, but not significant affecting his day-to-day activities.  His ADLs are mainly affected by his mobility issues.   REVIEW OF SYSTEMS: Full 14 system review of systems performed and negative with exception of: as per HPI.   ALLERGIES: Allergies  Allergen Reactions   Naproxen Rash   Clopidogrel Bisulfate Other (See Comments)    H/O diverticulitis; prone to rectal bleeding.  Plavix complicated this.  jkl   Sulfonamide Derivatives Hives   Other Other (See Comments)    Narcotics-C. Diff   Latex Rash   Tape Itching and Rash    Latex-Adhesive tape    HOME MEDICATIONS: Outpatient Medications Prior to Visit  Medication Sig Dispense Refill   acetaminophen (TYLENOL) 500 MG tablet Take 1,000 mg by mouth every 8 (eight) hours as needed for headache (pain).     amLODipine (NORVASC) 5 MG tablet TAKE 1 TABLET BY MOUTH EVERY DAY 90 tablet 3   aspirin EC 81 MG tablet Take 81 mg by mouth every morning.     blood glucose meter kit and supplies  KIT Dispense based on patient and insurance preference. Use up to four times daily as directed. (FOR ICD-9 250.00, 250.01). 1 each 0   carvedilol (COREG) 3.125 MG tablet TAKE 1 TABLET (3.125 MG TOTAL) BY MOUTH 2 (TWO) TIMES DAILY WITH A MEAL. (Patient taking differently: Take 3.125 mg by mouth 2 (two) times daily.) 180 tablet 1   citalopram (CELEXA) 40 MG tablet Take 1 tablet (40 mg total) by mouth daily. (Patient taking differently: Take 40 mg by mouth every morning.) 90 tablet 1   doxazosin (CARDURA) 4 MG tablet Take 4 mg by mouth at bedtime.     fluticasone (FLONASE) 50 MCG/ACT nasal spray Place 2 sprays into both nostrils daily.     furosemide (LASIX) 40 MG tablet Take 1 tablet (40 mg total) by mouth every Monday, Wednesday, and Friday.     nitroGLYCERIN (NITROSTAT) 0.4 MG SL tablet PLACE 1 TABLET UNDER THE TONGUE EVERY 5 MINUTES AS NEEDED FOR CHEST PAIN FOR 3 DOSES (Patient not taking: Reported on 08/11/2021) 25 tablet 0   pregabalin (LYRICA) 75 MG capsule Take 1 capsule (75 mg total) by mouth 3 (three) times daily. 90 capsule 3   simvastatin (ZOCOR) 40 MG tablet TAKE 1 TABLET BY MOUTH AT BEDTIME (Patient taking differently: Take 40 mg by mouth at bedtime.) 90 tablet 2   traMADol (ULTRAM) 50 MG tablet Take 50 mg by mouth 3 (three) times daily.     vitamin B-12 1000 MCG tablet Take 1  tablet (1,000 mcg total) by mouth daily. (Patient taking differently: Take 1,000 mcg by mouth daily with lunch.) 3 tablet    No facility-administered medications prior to visit.    PAST MEDICAL HISTORY: Past Medical History:  Diagnosis Date   Adenomatous colon polyp    2010    Anemia    Anxiety    Arthritis    "hips and lower back"   Arthritis pain    Atypical mole 06/21/2003   left neck, inferior - slight to moderate atypia   Basal cell carcinoma 10/04/2014   right shin bcc (tx cx3, 91f )   Blood transfusion    Chest pain 03/29/2011   2D Echo without contrast on 03/29/11 - EF= 55-60%.   Cholecystitis     Chronic back pain greater than 3 months duration    Chronic kidney disease    Colon polyps    Complication of anesthesia    once slow to wake up   Coronary angioplasty status 2011   Coronary artery disease    Diarrhea    Diverticulosis    Dyslipidemia    GERD (gastroesophageal reflux disease)    GI bleeding after 07/2009   "triggered by Plavix & ASA; from my diverticulosis"   H/O Clostridium difficile infection    H/O myocardial perfusion scan 04/04/2011   For Pre-noncardiac surgery - EF = 67%, no ischemia, and is considered low risk.   [place]   nonSTEMI with an occluded circumflex artery and collaterals.   Heart murmur    Hemorrhoids    History of shingles    Hyperlipidemia    Hypertension    Kidney stones    Lower extremity edema    Taking furosemide and it seems to be helping.   [place]romuscular [place]    Nodular infiltrative basal cell carcinoma (BCC) 02/08/2021   Right Forearm-anterior (tx p bx)   Nonspecific chest pain 08/26/2012   Went to ED 08/26/12   Parkinsonism    SCCA (squamous cell carcinoma) of skin 02/08/2021   Right Dorsal Hand (well diff) (tx p bx)   Squamous cell carcinoma of skin 05/13/2000   Right outer forehead   Squamous cell carcinoma of skin 03/02/2001   Post crown - tx 03/19/2001   Squamous cell carcinoma of skin 09/01/2001   Left post auricular - tx 10/09/2001   Squamous cell carcinoma of skin 06/21/2003   Top right ear - tx 07/05/2003   Squamous cell carcinoma of skin 06/21/2003   Right upper arm - tx 07/05/2003   Squamous cell carcinoma of skin 10/12/2003   Left superior ear lobe - MOHs   Squamous cell carcinoma of skin 12/15/2006   Right ear, superior - tx 01/26/2007   Squamous cell carcinoma of skin 03/13/2009   Right temple - tx 05/08/2009   Squamous cell carcinoma of skin 03/24/2013   Upper left forearm - tx p bx   Squamous cell carcinoma of skin 12/06/2013   Right forearm - CX3 + 5FU   Squamous cell  carcinoma of skin 02/10/2014   Right hand - tx p bx   Squamous cell carcinoma of skin 10/04/2014   Right shin - tx p bx   Squamous cell carcinoma of skin 08/21/2016   Left hand - tx p bx   Squamous cell carcinoma of skin 01/01/2017   Right hand - tx 02/06/2017   Squamous cell carcinoma of skin 10/21/2017   Left side of scalp - tx 03/12/2018   Squamous cell  carcinoma of skin 10/21/2017   Left post scalp, inf - tx 08/13/2018   Squamous cell carcinoma of skin 10/21/2017   Right scalp - tx p bx   Squamous cell carcinoma of skin 10/21/2017   Left cheek - tx 03/12/2018   Squamous cell carcinoma of skin 08/13/2018   Left top hand, proximal pinky - tx p bx   Squamous cell carcinoma of skin 12/23/2018   Right bicep lower - tx p bx   Squamous cell carcinoma of skin 05/18/2019   Right 4th finger metacarpophalangeal joint   Squamous cell carcinoma of skin 05/18/2019   Left 5th finger metacarpophalangeal joint   Squamous cell carcinoma of skin 05/18/2019   Left 2nd finger metacarpophalangeal joint   Stomach problems    Syncope and collapse 04/16/2018    PAST SURGICAL HISTORY: Past Surgical History:  Procedure Laterality Date   [place]  08/09/2009   Circumflex 100% occluded with right-to-left collaterals, mild irregularities in the proximal and distal LAD and diagonal system, normal LV systolic function, and minor infrarenal irregularities, but no abdominal aortic aneurysm.   CHOLECYSTECTOMY  04/12/2011   Procedure: CHOLECYSTECTOMY;  Surgeon: Adin Hector, MD;  Location: [place];  Service: General;  Laterality: N/A;   CHOLECYSTECTOMY  04/12/2011   Procedure: LAPAROSCOPIC CHOLECYSTECTOMY;  Surgeon: Adin Hector, MD;  Location: [place];  Service: General;  Laterality: N/A;  converted to open   COLON SURGERY     COLONOSCOPY   ESOPHAGOGASTRODUODENOSCOPY N/A 08/05/2013   Procedure: ESOPHAGOGASTRODUODENOSCOPY (EGD);  Surgeon: Jerene Bears, MD;  Location: [place];  Service: Gastroenterology;  Laterality: N/A;   EYE SURGERY  1942   "right eye was crossed; they  corrected it"   LUMBAR FUSION  07/22/2012   PACEMAKER IMPLANT N/A 04/17/2018   Procedure: PACEMAKER IMPLANT;  Surgeon: Deboraha Sprang, MD;  Location: [place] [facility];  Service: Cardiovascular;  Laterality: N/A;   POSTERIOR FUSION LUMBAR SPINE  08/1995   "bottom 4 vertebra"   SUBTOTAL COLECTOMY  01/24/2010   TOTAL HIP ARTHROPLASTY Right 08/03/2013   Procedure: TOTAL HIP ARTHROPLASTY ANTERIOR APPROACH;  Surgeon: Hessie Dibble, MD;  Location: [place];  Service: Orthopedics;  Laterality: Right;    FAMILY HISTORY: Family History  Problem Relation Age of Onset   Pancreatic cancer Mother    Heart disease Father    Stroke Father    Diverticulosis Sister        x4   Colon cancer Neg Hx     SOCIAL HISTORY: Social History   Socioeconomic History   Marital status: Married    Spouse name: Ann   Number of children: 2   Years of education: Not on file   Highest education level: Some college, no degree  Occupational History   Occupation: Art therapist   Occupation: retired    Comment: Company secretary  Tobacco Use   Smoking status: Never   Smokeless tobacco: Never  Scientific laboratory technician Use: Never used  Substance and Sexual Activity   Alcohol use: No   Drug use: No   Sexual activity: Not Currently  Other Topics Concern   Not on file  Social History Narrative   Lives with wife   Daily caffeine use.   Social Determinants of Health   Financial Resource Strain: Low Risk  (07/07/2019)   Overall Financial Resource Strain (CARDIA)    Difficulty of Paying Living Expenses: Not hard at all  Food Insecurity: No Food  Insecurity (07/07/2019)   Hunger Vital Sign    Worried About Running Out of Food in the Last Year: Never true    Ran Out of Food in the Last Year: Never true  Transportation Needs: No Transportation Needs (07/07/2019)   PRAPARE - Hydrologist  (Medical): No    Lack of Transportation (Non-Medical): No  Physical Activity: Sufficiently Active (07/07/2019)   Exercise Vital Sign    Days of Exercise per Week: 7 days    Minutes of Exercise per Session: 30 min  Stress: No Stress Concern Present (07/07/2019)   [place]    Feeling of Stress : Not at all  Social Connections: Not on file  Intimate Partner Violence: Not on file     PHYSICAL EXAM  GENERAL EXAM/CONSTITUTIONAL: Vitals:  There were no vitals filed for this visit.  There is no height or weight on file to calculate BMI. Wt Readings from Last 3 Encounters:  01/22/22 196 lb 3.4 oz (89 kg)  08/13/21 197 lb 6.4 oz (89.5 kg)  08/07/21 206 lb (93.4 kg)   Patient is in no distress; well developed, nourished and groomed; neck is supple  CARDIOVASCULAR: Examination of carotid arteries is normal; no carotid bruits Regular rate and rhythm, no murmurs Examination of peripheral vascular system by observation and palpation is normal  EYES: Ophthalmoscopic exam of optic discs and posterior segments is normal; no papilledema or hemorrhages No results found.  MUSCULOSKELETAL: Gait, strength, tone, movements noted in Neurologic exam below  NEUROLOGIC: MENTAL STATUS:      No data to display         awake, alert, oriented to person, place and time recent and remote memory intact normal attention and concentration language fluent, comprehension intact, naming intact fund of knowledge appropriate  CRANIAL NERVE:  2nd - no papilledema on fundoscopic exam 2nd, 3rd, 4th, 6th - pupils equal and reactive to light, visual fields full to confrontation, extraocular muscles intact, no nystagmus 5th - facial sensation symmetric 7th - facial strength symmetric 8th - hearing intact 9th - palate elevates symmetrically, uvula midline 11th - shoulder shrug symmetric 12th - tongue protrusion midline MILD VOICE  TREMOR  MOTOR:  normal bulk and tone BUE 5 except left biceps 4 RLE 5; LLE 3 PROX, 5 DISTAL  SENSORY:  normal and symmetric to light touch, temperature, vibration; DECR IN FEET  COORDINATION:  finger-nose-finger, fine finger movements normal  REFLEXES:  deep tendon reflexes TRACE and symmetric  GAIT/STATION:  narrow based gait; LIMPING ON LEFT LEG; STOOPED POSTURE; UNSTEADY     DIAGNOSTIC DATA (LABS, IMAGING, TESTING) - I reviewed patient records, labs, notes, testing and imaging myself where available.  Lab Results  Component Value Date   WBC 6.5 01/22/2022   HGB 13.1 01/22/2022   HCT 38.5 (L) 01/22/2022   MCV 88.3 01/22/2022   PLT 158 01/22/2022      Component Value Date/Time   NA 141 01/22/2022 1053   NA 138 11/18/2019 1214   K 4.2 01/22/2022 1053   CL 104 01/22/2022 1053   CO2 27 01/22/2022 1053   GLUCOSE 157 (H) 01/22/2022 1053   BUN 20 01/22/2022 1053   BUN 23 11/18/2019 1214   CREATININE 1.71 (H) 01/22/2022 1053   CREATININE 1.63 (H) 03/04/2011 1537   CALCIUM 9.3 01/22/2022 1053   PROT 6.5 01/22/2022 1053   PROT 6.5 11/03/2019 1504   ALBUMIN 4.0 01/22/2022 1053   ALBUMIN  4.5 11/03/2019 1504   AST 21 01/22/2022 1053   ALT 14 01/22/2022 1053   ALKPHOS 35 (L) 01/22/2022 1053   BILITOT 0.7 01/22/2022 1053   BILITOT 0.4 11/03/2019 1504   GFRNONAA 38 (L) 01/22/2022 1053   GFRAA 47 (L) 11/18/2019 1214   Lab Results  Component Value Date   CHOL  08/09/2009    164        ATP III CLASSIFICATION:  <200     mg/dL   Desirable  200-239  mg/dL   Borderline High  >=240    mg/dL   High          HDL 36 (L) 08/09/2009   LDLCALC (H) 08/09/2009    105        Total Cholesterol/HDL:CHD Risk Coronary Heart Disease Risk Table                     Men   Women  1/2 Average Risk   3.4   3.3  Average Risk       5.0   4.4  2 X Average Risk   9.6   7.1  3 X Average Risk  23.4   11.0        Use the calculated Patient Ratio above and the CHD Risk Table to  determine the patient's CHD Risk.        ATP III CLASSIFICATION (LDL):  <100     mg/dL   Optimal  100-129  mg/dL   Near or Above                    Optimal  130-159  mg/dL   Borderline  160-189  mg/dL   High  >190     mg/dL   Very High   TRIG 114 08/09/2009   CHOLHDL 4.6 08/09/2009   Lab Results  Component Value Date   HGBA1C 7.0 (H) 07/14/2019   Lab Results  Component Value Date   VITAMINB12 525 04/22/2019   Lab Results  Component Value Date   TSH 1.817 03/28/2019    03/27/19 CT head  - No evidence of acute intracranial abnormality. - Small vessel ischemic changes.    ASSESSMENT AND PLAN  89 y.o. year old male here with:  Dx:  No diagnosis found.   PLAN:  GAIT DIFFICULTY (due to lumbar spine dz / surgery, right hip replacement, left hip arthritis pain, diabetic neuropathy) - no signs of parkinson's at this time - refer to PT evaluation - may need home care aid  MILD MEMORY LOSS (likely MCI vs normal aging) - safety / supervision issues reviewed - daily physical activity / exercise (at least 15-30 minutes) - eat more plants / vegetables - increase social activities, brain stimulation, games, puzzles, hobbies, crafts, arts, music - aim for at least 7-8 hours sleep per night (or more) - avoid smoking and alcohol - caregiver resources provided - caution with medications, finances; no driving  No orders of the defined types were placed in this encounter.  No follow-ups on file.    I spent *** minutes of face-to-face and non-face-to-face time with patient.  This included previsit chart review, lab review, study review, order entry, electronic health record documentation, patient education  Frann Rider, [facility]  [facility] [address] [place], [place] [address]  Phone [phone] Fax [phone] Note: This document was prepared with digital dictation and possible smart phrase technology. Any transcriptional  errors that result from this process  are unintentional.

## 2022-02-21 ENCOUNTER — Ambulatory Visit: Payer: Medicare PPO | Admitting: Adult Health

## 2022-02-27 ENCOUNTER — Encounter: Payer: Self-pay | Admitting: Internal Medicine

## 2022-02-27 ENCOUNTER — Ambulatory Visit: Payer: Medicare PPO | Attending: Internal Medicine | Admitting: Internal Medicine

## 2022-02-27 VITALS — BP 132/70 | HR 66 | Ht 63.0 in | Wt 208.0 lb

## 2022-02-27 DIAGNOSIS — I050 Rheumatic mitral stenosis: Secondary | ICD-10-CM | POA: Diagnosis not present

## 2022-02-27 DIAGNOSIS — I77810 Thoracic aortic ectasia: Secondary | ICD-10-CM | POA: Diagnosis not present

## 2022-02-27 DIAGNOSIS — R0602 Shortness of breath: Secondary | ICD-10-CM

## 2022-02-27 DIAGNOSIS — R600 Localized edema: Secondary | ICD-10-CM

## 2022-02-27 DIAGNOSIS — N184 Chronic kidney disease, stage 4 (severe): Secondary | ICD-10-CM

## 2022-02-27 MED ORDER — FUROSEMIDE 40 MG PO TABS: 40.0000 mg | ORAL_TABLET | Freq: Every day | ORAL | 3 refills | Status: DC

## 2022-02-27 NOTE — Progress Notes (Signed)
OFFICE NOTE  Chief Complaint:  No complaints  Primary Care Physician: Clovia Cuff, MD  HPI:  Gary [name] is a 89 year old gentleman who has a history of coronary disease, was previously followed by Dr. Rex Kras. In 2011 he had a nonSTEMI with an occluded circumflex artery and collaterals. Therefore, he has been treated medically. He has done pretty well with that. He did have chest pain in 2013 and ended up having gallbladder disease and had that removed in March 2013. From time to time, he complains of some fatigue and has been intolerant of Bystolic, was switched to bisoprolol, seems to be tolerating that pretty well. There is also concern about some lower extremity edema which he has at the sock line but does take furosemide and seems to be helping with that. Blood pressure seems to be pretty well controlled and he does do some exercise a couple times a week and is currently denying any chest pain, worsening shortness of breath, palpitations, presyncope or syncopal symptoms. He underwent back surgery this past year for back pain in June by Dr. Belinda Block and still reports problems with sciatic type nerve pain down the right leg. He currently was in the courtesy room in July with chest pain, ruled out for MI by troponins.  Chest pain at that time was felt to be atypical. He has not had recurrence and did not have any complications with his surgery. He currently denies any chest pain or shortness of breath with exertion. He did inquire as to whether it was safe to continue to take AndroGel, which should be okay for him.  His only other concern is some positional dizziness at night.  Gary [name] returns today for follow-up. He is complaining of left hip pain is been undergoing some injections. He is considering left hip replacement. He seems to have done fairly well from the right hip surgery. He does report some mild shortness of breath with exertion but is not as active as he could be. Again he  has known coronary disease with an occluded circumflex and collateral vessels found by catheterization in 2011. He has not had recurrent testing since that time. He is also reporting difficulty reaching climax during sex. He has a history of low testosterone on medication and previously was seeing Dr. Gaynelle Arabian.  10/31/2015   Gary [name] returns today for follow-up. He reports that he ultimately did not undergo hip surgery because of concern for recovery. He reported his orthopedist was concerned about doing surgery because he had an extended hospital course secondary to C. difficile colitis which complicated his hip surgery in the past. He is similar problems again today including problems with short-term memory. He reports intermittent [place] with depression and is not sure that the Celexa he is taking his helping him much. He also reports anorgasmia. As mentioned before is a history of low testosterone. This may be related to medications, polypharmacy or depression. From a cardiac standpoint he currently denies any chest pain. He did have upper abdominal pain which was reported as chest pain and visited the emergency department April of this year. Extensive workup was negative. Stress testing last fall indicated no ischemia and a preserved EF with small area of scar which is known.  05/14/2016  Gary [name] was seen today in follow-up. Recently he underwent some cataract surgery. He denies any chest pain or worsenhoseing shortness of breath. He had what sounds like a presyncopal episode. He was standing for about 20 minutes in one position  talking to his neighbor holding a leaf blower. Apparently he became a little dizzy or presyncopal and fell to his left side striking his left pectoralis area on a car meter. He remembers the event. He did not fall to the ground. He's had no further events. There was no associated chest pain or heart racing. He does have a bruise over the left pectoralis muscle. He takes  Lasix every day for unknown reasons and has no significant swelling.  12/31/2016  Gary [name] returns today for follow-up.  He still has trouble with his hips.  He denies any chest pain or worsening shortness of breath.  Initially his blood pressure was elevated 162/78 however recheck came down to 142/70.  EKG shows sinus bradycardia with nonspecific ST and T wave changes.  He has had no recent chest pain, dizziness or presyncopal episodes.  10/03/2017  Gary [name] was seen today in follow-up.  Recently he had sent a message saying that he was concerned about his ongoing fatigue and depression and wondered if this could be related to a sleep disorder.  His primary care physician suggested he should have a sleep study.  Actually think this is quite reasonable.  He does have a history of A. fib and palpitations in the past.  Recently he has had some palpitations although infrequent.  He does report very poor exercise tolerance and general fatigue.  11/24/2020  Gary [name] returns today for follow-up.  I last saw her via virtual visit earlier in the year.  He had had some worsening shortness of breath.  Repeat echo was ordered and showed normal systolic function and some mild mitral stenosis.  He has had recurrent pacemaker checks which showed normal function.  Blood pressure is excellent today.  He denies any worsening shortness of breath.  He has had some memory issues reports some intermittent jerking of his left arm.  He is scheduled to see the neurologist in December.  He is also interested in establishing with a new primary care provider.  02/27/2022  Gary [name] is seen today in follow-up.  He has been in the ER for altered mental status and some weakness as well as some chest discomfort.  He ruled out for MI.  He has had some lower extremity swelling.  He does have some chronic kidney disease stage III or IV with a creatinine of 1.7 recently at his age.  He takes Lasix 3 times a week but has had  worsening lower extremity swelling and has had some skin cancers that have been removed and the wounds are difficult to heal.  He gets some shortness of breath with exertion.  His last echo was in 2022 which showed some mild mitral stenosis and a dilated aorta to 41 mm.  PMHx:  Past Medical History:  Diagnosis Date   Adenomatous colon polyp    2010    Anemia    Anxiety    Arthritis    "hips and lower back"   Arthritis pain    Atypical mole 06/21/2003   left neck, inferior - slight to moderate atypia   Basal cell carcinoma 10/04/2014   right shin bcc (tx cx3, 63f )   Blood transfusion    Chest pain 03/29/2011   2D Echo without contrast on 03/29/11 - EF= 55-60%.   Cholecystitis    Chronic back pain greater than 3 months duration    Chronic kidney disease    Colon polyps    Complication of anesthesia  once slow to wake up   Coronary angioplasty status 2011   Coronary artery disease    Diarrhea    Diverticulosis    Dyslipidemia    GERD (gastroesophageal reflux disease)    GI bleeding after 07/2009   "triggered by Plavix & ASA; from my diverticulosis"   H/O Clostridium difficile infection    H/O myocardial perfusion scan 04/04/2011   For Pre-noncardiac surgery - EF = 67%, no ischemia, and is considered low risk.   Heart attack ([place]) 07/2009   nonSTEMI with an occluded circumflex artery and collaterals.   Heart murmur    Hemorrhoids    History of shingles    Hyperlipidemia    Hypertension    Kidney stones    Lower extremity edema    Taking furosemide and it seems to be helping.   Neuromuscular disorder ([place])    Nodular infiltrative basal cell carcinoma (BCC) 02/08/2021   Right Forearm-anterior (tx p bx)   Nonspecific chest pain 08/26/2012   Went to ED 08/26/12   Parkinsonism    SCCA (squamous cell carcinoma) of skin 02/08/2021   Right Dorsal Hand (well diff) (tx p bx)   Squamous cell carcinoma of skin 05/13/2000   Right outer forehead   Squamous cell carcinoma of skin  03/02/2001   Post crown - tx 03/19/2001   Squamous cell carcinoma of skin 09/01/2001   Left post auricular - tx 10/09/2001   Squamous cell carcinoma of skin 06/21/2003   Top right ear - tx 07/05/2003   Squamous cell carcinoma of skin 06/21/2003   Right upper arm - tx 07/05/2003   Squamous cell carcinoma of skin 10/12/2003   Left superior ear lobe - MOHs   Squamous cell carcinoma of skin 12/15/2006   Right ear, superior - tx 01/26/2007   Squamous cell carcinoma of skin 03/13/2009   Right temple - tx 05/08/2009   Squamous cell carcinoma of skin 03/24/2013   Upper left forearm - tx p bx   Squamous cell carcinoma of skin 12/06/2013   Right forearm - CX3 + 5FU   Squamous cell carcinoma of skin 02/10/2014   Right hand - tx p bx   Squamous cell carcinoma of skin 10/04/2014   Right shin - tx p bx   Squamous cell carcinoma of skin 08/21/2016   Left hand - tx p bx   Squamous cell carcinoma of skin 01/01/2017   Right hand - tx 02/06/2017   Squamous cell carcinoma of skin 10/21/2017   Left side of scalp - tx 03/12/2018   Squamous cell carcinoma of skin 10/21/2017   Left post scalp, inf - tx 08/13/2018   Squamous cell carcinoma of skin 10/21/2017   Right scalp - tx p bx   Squamous cell carcinoma of skin 10/21/2017   Left cheek - tx 03/12/2018   Squamous cell carcinoma of skin 08/13/2018   Left top hand, proximal pinky - tx p bx   Squamous cell carcinoma of skin 12/23/2018   Right bicep lower - tx p bx   Squamous cell carcinoma of skin 05/18/2019   Right 4th finger metacarpophalangeal joint   Squamous cell carcinoma of skin 05/18/2019   Left 5th finger metacarpophalangeal joint   Squamous cell carcinoma of skin 05/18/2019   Left 2nd finger metacarpophalangeal joint   Stomach problems    Syncope and collapse 04/16/2018    Past Surgical History:  Procedure Laterality Date   [place]  08/09/2009  Circumflex 100% occluded with right-to-left  collaterals, mild irregularities in the proximal and distal LAD and diagonal system, normal LV systolic function, and minor infrarenal irregularities, but no abdominal aortic aneurysm.   CHOLECYSTECTOMY  04/12/2011   Procedure: CHOLECYSTECTOMY;  Surgeon: Adin Hector, MD;  Location: [facility];  Service: General;  Laterality: N/A;   CHOLECYSTECTOMY  04/12/2011   Procedure: LAPAROSCOPIC CHOLECYSTECTOMY;  Surgeon: Adin Hector, MD;  Location: [facility];  Service: General;  Laterality: N/A;  converted to open   COLON SURGERY     COLONOSCOPY   ESOPHAGOGASTRODUODENOSCOPY N/A 08/05/2013   Procedure: ESOPHAGOGASTRODUODENOSCOPY (EGD);  Surgeon: Jerene Bears, MD;  Location: [facility];  Service: Gastroenterology;  Laterality: N/A;   EYE SURGERY  1942   "right eye was crossed; they  corrected it"   LUMBAR FUSION  07/22/2012   PACEMAKER IMPLANT N/A 04/17/2018   Procedure: PACEMAKER IMPLANT;  Surgeon: Deboraha Sprang, MD;  Location: [facility];  Service: Cardiovascular;  Laterality: N/A;   POSTERIOR FUSION LUMBAR SPINE  08/1995   "bottom 4 vertebra"   SUBTOTAL COLECTOMY  01/24/2010   TOTAL HIP ARTHROPLASTY Right 08/03/2013   Procedure: TOTAL HIP ARTHROPLASTY ANTERIOR APPROACH;  Surgeon: Hessie Dibble, MD;  Location: [facility];  Service: Orthopedics;  Laterality: Right;    FAMHx:  Family History  Problem Relation Age of Onset   Pancreatic cancer Mother    Heart disease Father    Stroke Father    Diverticulosis Sister        x4   Colon cancer Neg Hx     SOCHx:   reports that he has never smoked. He has never used smokeless tobacco. He reports that he does not drink alcohol and does not use drugs.  ALLERGIES:  Allergies  Allergen Reactions   Naproxen Rash   Clopidogrel Bisulfate Other (See Comments)    H/O diverticulitis; prone to rectal bleeding.  Plavix complicated this.  jkl   Sulfonamide Derivatives Hives   Other Other (See Comments)    Narcotics-C. Diff   Latex Rash   Tape Itching  and Rash    Latex-Adhesive tape    ROS: Pertinent items noted in HPI and remainder of comprehensive ROS otherwise negative.  HOME MEDS: Current Outpatient Medications  Medication Sig Dispense Refill   acetaminophen (TYLENOL) 500 MG tablet Take 1,000 mg by mouth every 8 (eight) hours as needed for headache (pain).     amLODipine (NORVASC) 5 MG tablet TAKE 1 TABLET BY MOUTH EVERY DAY 90 tablet 3   aspirin EC 81 MG tablet Take 81 mg by mouth every morning.     blood glucose meter kit and supplies KIT Dispense based on patient and insurance preference. Use up to four times daily as directed. (FOR ICD-9 250.00, 250.01). 1 each 0   carvedilol (COREG) 3.125 MG tablet TAKE 1 TABLET (3.125 MG TOTAL) BY MOUTH 2 (TWO) TIMES DAILY WITH A MEAL. (Patient taking differently: Take 3.125 mg by mouth 2 (two) times daily.) 180 tablet 1   citalopram (CELEXA) 40 MG tablet Take 1 tablet (40 mg total) by mouth daily. (Patient taking differently: Take 40 mg by mouth every morning.) 90 tablet 1   doxazosin (CARDURA) 4 MG tablet Take 4 mg by mouth at bedtime.     fluticasone (FLONASE) 50 MCG/ACT nasal spray Place 2 sprays into both nostrils daily.     furosemide (LASIX) 40 MG tablet Take 1 tablet (40 mg total) by mouth every Monday, Wednesday, and Friday.  pregabalin (LYRICA) 75 MG capsule Take 1 capsule (75 mg total) by mouth 3 (three) times daily. 90 capsule 3   simvastatin (ZOCOR) 40 MG tablet TAKE 1 TABLET BY MOUTH AT BEDTIME (Patient taking differently: Take 40 mg by mouth at bedtime.) 90 tablet 2   traMADol (ULTRAM) 50 MG tablet Take 50 mg by mouth 3 (three) times daily.     vitamin B-12 1000 MCG tablet Take 1 tablet (1,000 mcg total) by mouth daily. (Patient taking differently: Take 1,000 mcg by mouth daily with lunch.) 3 tablet    nitroGLYCERIN (NITROSTAT) 0.4 MG SL tablet PLACE 1 TABLET UNDER THE TONGUE EVERY 5 MINUTES AS NEEDED FOR CHEST PAIN FOR 3 DOSES (Patient not taking: Reported on 08/11/2021) 25 tablet  0   No current facility-administered medications for this visit.    LABS/IMAGING: No results found for this or any previous visit (from the past 48 hour(s)). No results found.  VITALS: BP 132/70   Pulse 66   Ht '5\' 3"'$  (1.6 m)   Wt 208 lb (94.3 kg)   PF 96 L/min   BMI 36.85 kg/m   EXAM: General appearance: alert and no distress Neck: no carotid bruit and no JVD Lungs: clear to auscultation bilaterally Heart: regular rate and rhythm, S1, S2 normal, no murmur, click, rub or gallop Abdomen: soft, non-tender; bowel sounds normal; no masses,  no organomegaly Extremities: extremities normal, atraumatic, no cyanosis or edema and Bruising over the left pectoralis muscle Pulses: 2+ and symmetric Skin: Skin color, texture, turgor normal. No rashes or lesions Neurologic: Grossly normal Psych: Mood, affect normal  EKG: Deferred  ASSESSMENT: Coronary artery disease status post occluded circumflex in 2011 with collaterals Hypertension-controlled Dyslipidemia-at goal Legs/back pain - s/p R THR, now contemplating left hip replacement Indeterminate risk for hip surgery ?PAF - related to sepsis, less than 5 minutes duration Depression Anorgasmia Short-term memory loss Fatigue, nonrestorative sleep, snoring-possible sleep apnea Normal LVEF, mild mitral stenosis and dilated aorta 41 mm (06/2020) CKD stage 4  PLAN: 1.   Gary [name] says that he has had some worsening lower extremity swelling.  He does get some shortness of breath.  He has advanced chronic kidney disease which may be playing a role in this.  He is currently only taking Lasix 3 times a week.  I advised him to take Lasix 40 mg daily.  Will check a BNP and be met in about 2 weeks.  Will repeat an echo because of his swelling and for follow-up of mitral stenosis and a dilated aorta.  Remote pacemaker checks have been stable.  He has complete heart block.  Plan follow-up with [place] in about 3 months.  Pixie Casino, MD, [facility], [place] Director of the [facility]  Card[facility]ovascular [facility] Attending Cardiologist  Direct Dial: [phone]  Fax: [phone]  Website:  [email or web address]

## 2022-02-27 NOTE — Patient Instructions (Signed)
Medication Instructions:  INCREASE furosemide to '40mg'$  daily  *If you need a refill on your cardiac medications before your next appointment, please call your pharmacy*   Lab Work: BMET, BNP in 2 weeks -- non-fasting  If you have labs (blood work) drawn today and your tests are completely normal, you will receive your results only by: [place] (if you have MyChart) OR A paper copy in the mail If you have any lab test that is abnormal or we need to change your treatment, we will call you to review the results.   Testing/Procedures: Your physician has requested that you have an echocardiogram. Echocardiography is a painless test that uses sound waves to create images of your heart. It provides your doctor with information about the size and shape of your heart and how well your heart's chambers and valves are working. This procedure takes approximately one hour. There are no restrictions for this procedure. Please do NOT wear cologne, perfume, aftershave, or lotions (deodorant is allowed). Please arrive 15 minutes prior to your appointment time. This can be scheduled at [facility] [address]: At [facility], you and your health needs are our priority.  As part of our continuing mission to provide you with exceptional heart care, we have created designated Provider Care Teams.  These Care Teams include your primary Cardiologist (physician) and Advanced Practice Providers (APPs -  Physician Assistants and Nurse Practitioners) who all work together to provide you with the care you need, when you need it.  We recommend signing up for the patient portal called "MyChart".  Sign up information is provided on this After Visit Summary.  MyChart is used to connect with patients for Virtual Visits (Telemedicine).  Patients are able to view lab/test results, encounter notes, upcoming appointments, etc.  Non-urgent messages can be sent to your  provider as well.   To learn more about what you can do with MyChart, go to [email or web address].    Your next appointment:   3 month(s)  Provider:   Pixie Casino, MD  or NP/PA

## 2022-03-14 LAB — BASIC METABOLIC PANEL
BUN/Creatinine Ratio: 14 (ref 10–24)
BUN: 24 mg/dL (ref 8–27)
CO2: 26 mmol/L (ref 20–29)
Calcium: 9.5 mg/dL (ref 8.6–10.2)
Chloride: 102 mmol/L (ref 96–106)
Creatinine, Ser: 1.76 mg/dL — ABNORMAL HIGH (ref 0.76–1.27)
Glucose: 148 mg/dL — ABNORMAL HIGH (ref 70–99)
Potassium: 4.9 mmol/L (ref 3.5–5.2)
Sodium: 143 mmol/L (ref 134–144)
eGFR: 37 mL/min/{1.73_m2} — ABNORMAL LOW (ref >59)

## 2022-03-14 LAB — BRAIN NATRIURETIC PEPTIDE: BNP: 127 pg/mL — ABNORMAL HIGH (ref 0.0–100.0)

## 2022-03-29 ENCOUNTER — Ambulatory Visit (INDEPENDENT_AMBULATORY_CARE_PROVIDER_SITE_OTHER): Payer: Medicare PPO

## 2022-03-29 DIAGNOSIS — I442 Atrioventricular block, complete: Secondary | ICD-10-CM

## 2022-03-29 LAB — CUP PACEART REMOTE DEVICE CHECK
Battery Remaining Longevity: 94 mo
Battery Voltage: 2.99 V
Brady Statistic AP VP Percent: 45 %
Brady Statistic AP VS Percent: 0.01 %
Brady Statistic AS VP Percent: 54.85 %
Brady Statistic AS VS Percent: 0.14 %
Brady Statistic RA Percent Paced: 44.76 %
Brady Statistic RV Percent Paced: 99.85 %
Date Time Interrogation Session: 20240215190948
Implantable Lead Connection Status: 753985
Implantable Lead Connection Status: 753985
Implantable Lead Implant Date: 20200306
Implantable Lead Implant Date: 20200306
Implantable Lead Location: 753859
Implantable Lead Location: 753860
Implantable Lead Model: 5076
Implantable Lead Model: 5076
Implantable Pulse Generator Implant Date: 20200306
Lead Channel Impedance Value: 323 Ohm
Lead Channel Impedance Value: 361 Ohm
Lead Channel Impedance Value: 361 Ohm
Lead Channel Impedance Value: 418 Ohm
Lead Channel Pacing Threshold Amplitude: 0.5 V
Lead Channel Pacing Threshold Amplitude: 0.5 V
Lead Channel Pacing Threshold Pulse Width: 0.4 ms
Lead Channel Pacing Threshold Pulse Width: 0.4 ms
Lead Channel Sensing Intrinsic Amplitude: 1.625 mV
Lead Channel Sensing Intrinsic Amplitude: 1.625 mV
Lead Channel Sensing Intrinsic Amplitude: 8.625 mV
Lead Channel Sensing Intrinsic Amplitude: 8.625 mV
Lead Channel Setting Pacing Amplitude: 1.5 V
Lead Channel Setting Pacing Amplitude: 2.5 V
Lead Channel Setting Pacing Pulse Width: 0.4 ms
Lead Channel Setting Sensing Sensitivity: 0.6 mV
Zone Setting Status: 755011
Zone Setting Status: 755011

## 2022-04-12 ENCOUNTER — Telehealth (HOSPITAL_COMMUNITY): Payer: Self-pay | Admitting: Internal Medicine

## 2022-04-12 ENCOUNTER — Other Ambulatory Visit (HOSPITAL_COMMUNITY): Payer: Medicare PPO

## 2022-04-12 NOTE — Telephone Encounter (Signed)
Patients spouse called the same day of echocardiogram to cancel. She states patient is getting more feeble and just unable to come for appt. She will call back when he is able to reschedule. Order will be removed from the echo WQ. Thank you.

## 2022-04-16 ENCOUNTER — Ambulatory Visit (HOSPITAL_COMMUNITY): Payer: Medicare PPO | Attending: Cardiovascular Disease

## 2022-04-16 DIAGNOSIS — I340 Nonrheumatic mitral (valve) insufficiency: Secondary | ICD-10-CM

## 2022-04-16 DIAGNOSIS — I050 Rheumatic mitral stenosis: Secondary | ICD-10-CM | POA: Insufficient documentation

## 2022-04-16 DIAGNOSIS — I77810 Thoracic aortic ectasia: Secondary | ICD-10-CM | POA: Diagnosis not present

## 2022-04-16 DIAGNOSIS — R600 Localized edema: Secondary | ICD-10-CM | POA: Diagnosis not present

## 2022-04-16 LAB — ECHOCARDIOGRAM COMPLETE
AR max vel: 1.7 cm2
AV Area VTI: 1.71 cm2
AV Area mean vel: 1.77 cm2
AV Mean grad: 9.8 mmHg
AV Peak grad: 18.3 mmHg
Ao pk vel: 2.14 m/s
Area-P 1/2: 2.23 cm2
Est EF: 50
MV VTI: 1.2 cm2
S' Lateral: 4 cm

## 2022-04-23 NOTE — Progress Notes (Signed)
Remote pacemaker transmission.   

## 2022-04-26 ENCOUNTER — Ambulatory Visit: Payer: Medicare PPO | Attending: Physician Assistant | Admitting: Physician Assistant

## 2022-04-26 ENCOUNTER — Encounter: Payer: Self-pay | Admitting: Physician Assistant

## 2022-04-26 VITALS — BP 138/74 | HR 60 | Ht 63.0 in | Wt 209.0 lb

## 2022-04-26 DIAGNOSIS — I10 Essential (primary) hypertension: Secondary | ICD-10-CM

## 2022-04-26 DIAGNOSIS — Z950 Presence of cardiac pacemaker: Secondary | ICD-10-CM

## 2022-04-26 DIAGNOSIS — I2510 Atherosclerotic heart disease of native coronary artery without angina pectoris: Secondary | ICD-10-CM

## 2022-04-26 LAB — CUP PACEART INCLINIC DEVICE CHECK
Battery Remaining Longevity: 93 mo
Battery Voltage: 2.99 V
Brady Statistic AP VP Percent: 39.92 %
Brady Statistic AP VS Percent: 0.12 %
Brady Statistic AS VP Percent: 58.56 %
Brady Statistic AS VS Percent: 1.4 %
Brady Statistic RA Percent Paced: 39.86 %
Brady Statistic RV Percent Paced: 98.48 %
Date Time Interrogation Session: 20240315154156
Implantable Lead Connection Status: 753985
Implantable Lead Connection Status: 753985
Implantable Lead Implant Date: 20200306
Implantable Lead Implant Date: 20200306
Implantable Lead Location: 753859
Implantable Lead Location: 753860
Implantable Lead Model: 5076
Implantable Lead Model: 5076
Implantable Pulse Generator Implant Date: 20200306
Lead Channel Impedance Value: 304 Ohm
Lead Channel Impedance Value: 342 Ohm
Lead Channel Impedance Value: 361 Ohm
Lead Channel Impedance Value: 418 Ohm
Lead Channel Pacing Threshold Amplitude: 0.5 V
Lead Channel Pacing Threshold Amplitude: 0.5 V
Lead Channel Pacing Threshold Pulse Width: 0.4 ms
Lead Channel Pacing Threshold Pulse Width: 0.4 ms
Lead Channel Sensing Intrinsic Amplitude: 1 mV
Lead Channel Sensing Intrinsic Amplitude: 1.75 mV
Lead Channel Sensing Intrinsic Amplitude: 8.625 mV
Lead Channel Sensing Intrinsic Amplitude: 8.625 mV
Lead Channel Setting Pacing Amplitude: 1.5 V
Lead Channel Setting Pacing Amplitude: 2.5 V
Lead Channel Setting Pacing Pulse Width: 0.4 ms
Lead Channel Setting Sensing Sensitivity: 0.6 mV
Zone Setting Status: 755011
Zone Setting Status: 755011

## 2022-04-26 NOTE — Patient Instructions (Signed)
Medication Instructions:   *If you need a refill on your cardiac medications before your next appointment, please call your pharmacy*   Lab Work:  NONE ORDERED  TODAY   If you have labs (blood work) drawn today and your tests are completely normal, you will receive your results only by: [place] (if you have MyChart) OR A paper copy in the mail If you have any lab test that is abnormal or we need to change your treatment, we will call you to review the results.   Testing/Procedures: NONE ORDERED  TODAY     Follow-Up: At [facility], you and your health needs are our priority.  As part of our continuing mission to provide you with exceptional heart care, we have created designated Provider Care Teams.  These Care Teams include your primary Cardiologist (physician) and Advanced Practice Providers (APPs -  Physician Assistants and Nurse Practitioners) who all work together to provide you with the care you need, when you need it.  We recommend signing up for the patient portal called "MyChart".  Sign up information is provided on this After Visit Summary.  MyChart is used to connect with patients for Virtual Visits (Telemedicine).  Patients are able to view lab/test results, encounter notes, upcoming appointments, etc.  Non-urgent messages can be sent to your provider as well.   To learn more about what you can do with MyChart, go to [email or web address].    Your next appointment:   1 year(s)  Provider:   You may see Virl Axe, MD or one of the following Advanced Practice Providers on your designated Care Team:   Tommye Standard, [place] Legrand Como "Jonni Sanger" Chalmers Cater, [place]  Other Instructions

## 2022-04-26 NOTE — Progress Notes (Signed)
Cardiology Office Note Date:  04/26/2022  Patient ID:  Gary [name] 11/12/1932, MRN [id number] PCP:  Jennette Dubin, NP  Cardiologist:  Dr. Debara Pickett Electrophysiologist: Dr. Caryl Comes     Chief Complaint: over due visit  History of Present Illness: Gary [name] is a 89 y.o. male with history of CAD (201, nonSTEMI with an occluded circumflex artery and collaterals), CKD (III-IV), HTN, HLD, PPM  There is mention of a hx of questionable AFib remotely associated with sepsis  He last saw Dr. Caryl Comes 07/28/2018 via telehealth visit, mentioned need for an in office visit for reprogramming of outputs This is the last EP visit that I find,  Following with gen cards routinely. Saw Dr. Ernestene Kiel 02/27/22, apparently a recent ER visit with some AMS, weakness, CP, MI r/o. C/o some edema and his lasix increased from 3x week to daily, updated echo  LVEF 50%, global hypokinesis, trivial MR, mild MS, mild AS  + remotes  TODAY He is accompanied by his wife. Denies any CP, palpitations or cardiac awareness. Does have some DOE, not new, they have d/w Dr. Debara Pickett  no rest SOB, looking to get started with some PT. No near syncope or syncope. His wife mentions that every once in a while he seems to "jolt" for a slit second, this pre-date the pacer. No change in behavior, frequency, no pattern.    Device information MDT dual chamber PPM implanted 04/17/2018   Past Medical History:  Diagnosis Date   Adenomatous colon polyp    2010    Anemia    Anxiety    Arthritis    "hips and lower back"   Arthritis pain    Atypical mole 06/21/2003   left neck, inferior - slight to moderate atypia   Basal cell carcinoma 10/04/2014   right shin bcc (tx cx3, 61fu )   Blood transfusion    Chest pain 03/29/2011   2D Echo without contrast on 03/29/11 - EF= 55-60%.   Cholecystitis    Chronic back pain greater than 3 months duration    Chronic kidney disease    Colon polyps    Complication of anesthesia     once slow to wake up   Coronary angioplasty status 2011   Coronary artery disease    Diarrhea    Diverticulosis    Dyslipidemia    GERD (gastroesophageal reflux disease)    GI bleeding after 07/2009   "triggered by Plavix & ASA; from my diverticulosis"   H/O Clostridium difficile infection    H/O myocardial perfusion scan 04/04/2011   For Pre-noncardiac surgery - EF = 67%, no ischemia, and is considered low risk.   Heart attack ([place]) 07/2009   nonSTEMI with an occluded circumflex artery and collaterals.   Heart murmur    Hemorrhoids    History of shingles    Hyperlipidemia    Hypertension    Kidney stones    Lower extremity edema    Taking furosemide and it seems to be helping.   Neuromuscular disorder ([place])    Nodular infiltrative basal cell carcinoma (BCC) 02/08/2021   Right Forearm-anterior (tx p bx)   Nonspecific chest pain 08/26/2012   Went to ED 08/26/12   Parkinsonism    SCCA (squamous cell carcinoma) of skin 02/08/2021   Right Dorsal Hand (well diff) (tx p bx)   Squamous cell carcinoma of skin 05/13/2000   Right outer forehead   Squamous cell carcinoma of skin 03/02/2001   Post crown -  tx 03/19/2001   Squamous cell carcinoma of skin 09/01/2001   Left post auricular - tx 10/09/2001   Squamous cell carcinoma of skin 06/21/2003   Top right ear - tx 07/05/2003   Squamous cell carcinoma of skin 06/21/2003   Right upper arm - tx 07/05/2003   Squamous cell carcinoma of skin 10/12/2003   Left superior ear lobe - MOHs   Squamous cell carcinoma of skin 12/15/2006   Right ear, superior - tx 01/26/2007   Squamous cell carcinoma of skin 03/13/2009   Right temple - tx 05/08/2009   Squamous cell carcinoma of skin 03/24/2013   Upper left forearm - tx p bx   Squamous cell carcinoma of skin 12/06/2013   Right forearm - CX3 + 5FU   Squamous cell carcinoma of skin 02/10/2014   Right hand - tx p bx   Squamous cell carcinoma of skin 10/04/2014   Right shin - tx p bx    Squamous cell carcinoma of skin 08/21/2016   Left hand - tx p bx   Squamous cell carcinoma of skin 01/01/2017   Right hand - tx 02/06/2017   Squamous cell carcinoma of skin 10/21/2017   Left side of scalp - tx 03/12/2018   Squamous cell carcinoma of skin 10/21/2017   Left post scalp, inf - tx 08/13/2018   Squamous cell carcinoma of skin 10/21/2017   Right scalp - tx p bx   Squamous cell carcinoma of skin 10/21/2017   Left cheek - tx 03/12/2018   Squamous cell carcinoma of skin 08/13/2018   Left top hand, proximal pinky - tx p bx   Squamous cell carcinoma of skin 12/23/2018   Right bicep lower - tx p bx   Squamous cell carcinoma of skin 05/18/2019   Right 4th finger metacarpophalangeal joint   Squamous cell carcinoma of skin 05/18/2019   Left 5th finger metacarpophalangeal joint   Squamous cell carcinoma of skin 05/18/2019   Left 2nd finger metacarpophalangeal joint   Stomach problems    Syncope and collapse 04/16/2018    Past Surgical History:  Procedure Laterality Date   [place]  08/09/2009   Circumflex 100% occluded with right-to-left collaterals, mild irregularities in the proximal and distal LAD and diagonal system, normal LV systolic function, and minor infrarenal irregularities, but no abdominal aortic aneurysm.   CHOLECYSTECTOMY  04/12/2011   Procedure: CHOLECYSTECTOMY;  Surgeon: Adin Hector, MD;  Location: [place];  Service: General;  Laterality: N/A;   CHOLECYSTECTOMY  04/12/2011   Procedure: LAPAROSCOPIC CHOLECYSTECTOMY;  Surgeon: Adin Hector, MD;  Location: [place];  Service: General;  Laterality: N/A;  converted to open   COLON SURGERY     COLONOSCOPY   ESOPHAGOGASTRODUODENOSCOPY N/A 08/05/2013   Procedure: ESOPHAGOGASTRODUODENOSCOPY (EGD);  Surgeon: Jerene Bears, MD;  Location: [place];  Service: Gastroenterology;  Laterality: N/A;   EYE SURGERY  1942   "right eye was crossed; they  corrected it"   LUMBAR FUSION   07/22/2012   PACEMAKER IMPLANT N/A 04/17/2018   Procedure: PACEMAKER IMPLANT;  Surgeon: Deboraha Sprang, MD;  Location: [place] [facility];  Service: Cardiovascular;  Laterality: N/A;   POSTERIOR FUSION LUMBAR SPINE  08/1995   "bottom 4 vertebra"   SUBTOTAL COLECTOMY  01/24/2010   TOTAL HIP ARTHROPLASTY Right 08/03/2013   Procedure: TOTAL HIP ARTHROPLASTY ANTERIOR APPROACH;  Surgeon: Hessie Dibble, MD;  Location: [place];  Service: Orthopedics;  Laterality: Right;  Current Outpatient Medications  Medication Sig Dispense Refill   acetaminophen (TYLENOL) 500 MG tablet Take 1,000 mg by mouth every 8 (eight) hours as needed for headache (pain).     amLODipine (NORVASC) 5 MG tablet TAKE 1 TABLET BY MOUTH EVERY DAY 90 tablet 3   aspirin EC 81 MG tablet Take 81 mg by mouth every morning.     blood glucose meter kit and supplies KIT Dispense based on patient and insurance preference. Use up to four times daily as directed. (FOR ICD-9 250.00, 250.01). 1 each 0   carvedilol (COREG) 3.125 MG tablet TAKE 1 TABLET (3.125 MG TOTAL) BY MOUTH 2 (TWO) TIMES DAILY WITH A MEAL. 180 tablet 1   citalopram (CELEXA) 40 MG tablet Take 1 tablet (40 mg total) by mouth daily. 90 tablet 1   donepezil (ARICEPT) 5 MG tablet Take 5 mg by mouth at bedtime.     doxazosin (CARDURA) 4 MG tablet Take 4 mg by mouth at bedtime.     fluticasone (FLONASE) 50 MCG/ACT nasal spray Place 2 sprays into both nostrils daily.     furosemide (LASIX) 40 MG tablet Take 1 tablet (40 mg total) by mouth daily. 90 tablet 3   metFORMIN (GLUCOPHAGE-XR) 500 MG 24 hr tablet Take 500 mg by mouth daily.     nitroGLYCERIN (NITROSTAT) 0.4 MG SL tablet PLACE 1 TABLET UNDER THE TONGUE EVERY 5 MINUTES AS NEEDED FOR CHEST PAIN FOR 3 DOSES 25 tablet 0   nystatin (MYCOSTATIN/NYSTOP) powder Apply topically 2 (two) times daily.     omeprazole (PRILOSEC) 40 MG capsule 1 capsule 30 minutes before morning meal Orally     pregabalin (LYRICA) 75 MG capsule Take 1  capsule (75 mg total) by mouth 3 (three) times daily. 90 capsule 3   simvastatin (ZOCOR) 40 MG tablet TAKE 1 TABLET BY MOUTH AT BEDTIME 90 tablet 2   traMADol (ULTRAM) 50 MG tablet Take 50 mg by mouth 3 (three) times daily.     vitamin B-12 1000 MCG tablet Take 1 tablet (1,000 mcg total) by mouth daily. 3 tablet    No current facility-administered medications for this visit.    Allergies:   Naproxen, Clopidogrel bisulfate, Other, Sulfonamide derivatives, Latex, and Tape   Social History:  The patient  reports that he has never smoked. He has never used smokeless tobacco. He reports that he does not drink alcohol and does not use drugs.   Family History:  The patient's family history includes Diverticulosis in his sister; Heart disease in his father; Pancreatic cancer in his mother; Stroke in his father.  ROS:  Please see the history of present illness.    All other systems are reviewed and otherwise negative.   PHYSICAL EXAM:  VS:  BP 138/74   Pulse 60   Ht 5\' 3"  (1.6 m)   Wt 209 lb (94.8 kg)   SpO2 95%   BMI 37.02 kg/m  BMI: Body mass index is 37.02 kg/m. Well nourished, well developed, in no acute distress HEENT: normocephalic, atraumatic Neck: no JVD, carotid bruits or masses Cardiac:  RRR; 2-3/6 SM, no rubs, or gallops Lungs:   CTA b/l, no wheezing, rhonchi or rales Abd: soft, nontender MS: no deformity or atrophy Ext: trace edema Skin: warm and dry, no rash Neuro:  No gross deficits appreciated Psych: euthymic mood, full affect  PPM site is stable, no tethering or discomfort   EKG:  not done today  Device interrogation done today and reviewed by myself:  Battery and lead measurements are good PACs noted No arrhythmias   04/16/2022: TTE 1. There is significant septal-lateral wall dyssynchrony due to  ventricular pacing. Left ventricular ejection fraction, by estimation, is  50%. The left ventricle has low normal function. The left ventricle  demonstrates global  hypokinesis. There is mild  concentric left ventricular hypertrophy. Left ventricular diastolic  parameters are consistent with Grade I diastolic dysfunction (impaired  relaxation).   2. Right ventricular systolic function is normal. The right ventricular  size is normal.   3. Left atrial size was moderately dilated.   4. Right atrial size was mildly dilated.   5. The mitral valve is degenerative. Trivial mitral valve regurgitation.  Mild mitral stenosis. The mean mitral valve gradient is 6.0 mmHg with  average heart rate of 66 bpm. Severe mitral annular calcification.   6. The aortic valve is tricuspid. There is moderate calcification of the  aortic valve. There is moderate thickening of the aortic valve. Aortic  valve regurgitation is not visualized. Mild aortic valve stenosis. Aortic  valve mean gradient measures 9.8  mmHg. Aortic valve Vmax measures 2.14 m/s. Aortic valve acceleration time  measures 87 msec.   7. There is mild dilatation of the ascending aorta, measuring 41 mm.   Comparison(s): Prior images reviewed side by side. The left ventricular  function is worsened. The decline in LV function appears to be occuring at  a slow pace since pacemaker implantation and is more obvious when compared  to the 2021 study. There is a  substantial change when compared to the 2020 study, pre-pacemaker. Mitral  stenosis is unchanged.    01/13/2015: stress myoview The left ventricular ejection fraction is normal (55-65%). Nuclear stress EF: 55%. There was no ST segment deviation noted during stress. This is a low risk study. Findings consistent with ischemia and prior myocardial infarction.   Low risk stress nuclear study with a small, severe, partially reversible lateral defect consistent with small prior infarct and very mild peri-infarct ischemia; EF 55 with hypokinesis of the high lateral wall.     Recent Labs: 01/22/2022: ALT 14; Hemoglobin 13.1; Magnesium 2.1; Platelets  158 03/13/2022: BNP 127.0; BUN 24; Creatinine, Ser 1.76; Potassium 4.9; Sodium 143  No results found for requested labs within last 365 days.   CrCl cannot be calculated (Patient's most recent lab result is older than the maximum 21 days allowed.).   Wt Readings from Last 3 Encounters:  04/26/22 209 lb (94.8 kg)  02/27/22 208 lb (94.3 kg)  01/22/22 196 lb 3.4 oz (89 kg)     Other studies reviewed: Additional studies/records reviewed today include: summarized above  ASSESSMENT AND PLAN:  PPM Intact function No programming changes made Dependent today at 40bpm VP 98.6%  CAD No anginal symptoms Follows with Dr. Debara Pickett  HTN Looks good No changes today  HLD They report labs are done with his PMD   Disposition: F/u with remotes as usual, in clinic with EP in a year, sooner if needed  Current medicines are reviewed at length with the patient today.  The patient did not have any concerns regarding medicines.  Venetia Night, PA-C 04/26/2022 2:53 PM     [place] [address] [phone] (office)  [phone] (fax)

## 2022-05-20 ENCOUNTER — Encounter: Payer: Self-pay | Admitting: Gastroenterology

## 2022-06-04 ENCOUNTER — Encounter: Payer: Self-pay | Admitting: Internal Medicine

## 2022-06-04 ENCOUNTER — Ambulatory Visit: Payer: Medicare PPO | Admitting: Internal Medicine

## 2022-06-28 ENCOUNTER — Ambulatory Visit (INDEPENDENT_AMBULATORY_CARE_PROVIDER_SITE_OTHER): Payer: Medicare PPO

## 2022-06-28 DIAGNOSIS — I442 Atrioventricular block, complete: Secondary | ICD-10-CM | POA: Diagnosis not present

## 2022-06-28 LAB — CUP PACEART REMOTE DEVICE CHECK
Battery Remaining Longevity: 90 mo
Battery Voltage: 2.99 V
Brady Statistic AP VP Percent: 44.71 %
Brady Statistic AP VS Percent: 0.01 %
Brady Statistic AS VP Percent: 55.18 %
Brady Statistic AS VS Percent: 0.1 %
Brady Statistic RA Percent Paced: 44.43 %
Brady Statistic RV Percent Paced: 99.89 %
Date Time Interrogation Session: 20240517060201
Implantable Lead Connection Status: 753985
Implantable Lead Connection Status: 753985
Implantable Lead Implant Date: 20200306
Implantable Lead Implant Date: 20200306
Implantable Lead Location: 753859
Implantable Lead Location: 753860
Implantable Lead Model: 5076
Implantable Lead Model: 5076
Implantable Pulse Generator Implant Date: 20200306
Lead Channel Impedance Value: 304 Ohm
Lead Channel Impedance Value: 323 Ohm
Lead Channel Impedance Value: 342 Ohm
Lead Channel Impedance Value: 399 Ohm
Lead Channel Pacing Threshold Amplitude: 0.5 V
Lead Channel Pacing Threshold Amplitude: 0.5 V
Lead Channel Pacing Threshold Pulse Width: 0.4 ms
Lead Channel Pacing Threshold Pulse Width: 0.4 ms
Lead Channel Sensing Intrinsic Amplitude: 1 mV
Lead Channel Sensing Intrinsic Amplitude: 1 mV
Lead Channel Sensing Intrinsic Amplitude: 8.625 mV
Lead Channel Sensing Intrinsic Amplitude: 8.625 mV
Lead Channel Setting Pacing Amplitude: 1.5 V
Lead Channel Setting Pacing Amplitude: 2.5 V
Lead Channel Setting Pacing Pulse Width: 0.4 ms
Lead Channel Setting Sensing Sensitivity: 0.6 mV
Zone Setting Status: 755011
Zone Setting Status: 755011

## 2022-07-10 NOTE — Progress Notes (Signed)
Remote pacemaker transmission.   

## 2022-07-29 ENCOUNTER — Ambulatory Visit: Payer: Medicare PPO | Admitting: Gastroenterology

## 2022-07-30 ENCOUNTER — Ambulatory Visit: Payer: Medicare PPO | Admitting: Diagnostic Neuroimaging

## 2022-09-09 ENCOUNTER — Ambulatory Visit: Payer: Medicare PPO | Admitting: Adult Health

## 2022-09-27 ENCOUNTER — Ambulatory Visit: Payer: Medicare PPO

## 2022-09-27 DIAGNOSIS — I442 Atrioventricular block, complete: Secondary | ICD-10-CM

## 2022-09-27 LAB — CUP PACEART REMOTE DEVICE CHECK
Battery Remaining Longevity: 88 mo
Battery Voltage: 2.98 V
Brady Statistic AP VP Percent: 46.43 %
Brady Statistic AP VS Percent: 0.01 %
Brady Statistic AS VP Percent: 53.43 %
Brady Statistic AS VS Percent: 0.13 %
Brady Statistic RA Percent Paced: 46.17 %
Brady Statistic RV Percent Paced: 99.86 %
Date Time Interrogation Session: 20240816033557
Implantable Lead Connection Status: 753985
Implantable Lead Connection Status: 753985
Implantable Lead Implant Date: 20200306
Implantable Lead Implant Date: 20200306
Implantable Lead Location: 753859
Implantable Lead Location: 753860
Implantable Lead Model: 5076
Implantable Lead Model: 5076
Implantable Pulse Generator Implant Date: 20200306
Lead Channel Impedance Value: 304 Ohm
Lead Channel Impedance Value: 342 Ohm
Lead Channel Impedance Value: 361 Ohm
Lead Channel Impedance Value: 418 Ohm
Lead Channel Pacing Threshold Amplitude: 0.5 V
Lead Channel Pacing Threshold Amplitude: 0.5 V
Lead Channel Pacing Threshold Pulse Width: 0.4 ms
Lead Channel Pacing Threshold Pulse Width: 0.4 ms
Lead Channel Sensing Intrinsic Amplitude: 1.125 mV
Lead Channel Sensing Intrinsic Amplitude: 1.125 mV
Lead Channel Sensing Intrinsic Amplitude: 8.625 mV
Lead Channel Sensing Intrinsic Amplitude: 8.625 mV
Lead Channel Setting Pacing Amplitude: 1.5 V
Lead Channel Setting Pacing Amplitude: 2.5 V
Lead Channel Setting Pacing Pulse Width: 0.4 ms
Lead Channel Setting Sensing Sensitivity: 0.6 mV
Zone Setting Status: 755011
Zone Setting Status: 755011

## 2022-10-07 NOTE — Progress Notes (Signed)
Remote pacemaker transmission.   

## 2022-10-08 ENCOUNTER — Institutional Professional Consult (permissible substitution): Payer: Medicare PPO | Admitting: Diagnostic Neuroimaging

## 2022-10-30 ENCOUNTER — Other Ambulatory Visit: Payer: Self-pay | Admitting: Internal Medicine

## 2022-12-27 ENCOUNTER — Ambulatory Visit (INDEPENDENT_AMBULATORY_CARE_PROVIDER_SITE_OTHER): Payer: Medicare PPO

## 2022-12-27 DIAGNOSIS — I442 Atrioventricular block, complete: Secondary | ICD-10-CM

## 2022-12-28 LAB — CUP PACEART REMOTE DEVICE CHECK
Battery Remaining Longevity: 85 mo
Battery Voltage: 2.98 V
Brady Statistic AP VP Percent: 47.07 %
Brady Statistic AP VS Percent: 0.01 %
Brady Statistic AS VP Percent: 52.76 %
Brady Statistic AS VS Percent: 0.16 %
Brady Statistic RA Percent Paced: 46.78 %
Brady Statistic RV Percent Paced: 99.83 %
Date Time Interrogation Session: 20241114225758
Implantable Lead Connection Status: 753985
Implantable Lead Connection Status: 753985
Implantable Lead Implant Date: 20200306
Implantable Lead Implant Date: 20200306
Implantable Lead Location: 753859
Implantable Lead Location: 753860
Implantable Lead Model: 5076
Implantable Lead Model: 5076
Implantable Pulse Generator Implant Date: 20200306
Lead Channel Impedance Value: 323 Ohm
Lead Channel Impedance Value: 361 Ohm
Lead Channel Impedance Value: 361 Ohm
Lead Channel Impedance Value: 418 Ohm
Lead Channel Pacing Threshold Amplitude: 0.5 V
Lead Channel Pacing Threshold Amplitude: 0.5 V
Lead Channel Pacing Threshold Pulse Width: 0.4 ms
Lead Channel Pacing Threshold Pulse Width: 0.4 ms
Lead Channel Sensing Intrinsic Amplitude: 1.5 mV
Lead Channel Sensing Intrinsic Amplitude: 1.5 mV
Lead Channel Sensing Intrinsic Amplitude: 9.5 mV
Lead Channel Sensing Intrinsic Amplitude: 9.5 mV
Lead Channel Setting Pacing Amplitude: 1.5 V
Lead Channel Setting Pacing Amplitude: 2.5 V
Lead Channel Setting Pacing Pulse Width: 0.4 ms
Lead Channel Setting Sensing Sensitivity: 0.6 mV
Zone Setting Status: 755011
Zone Setting Status: 755011

## 2023-01-08 NOTE — Progress Notes (Signed)
Remote pacemaker transmission.   

## 2023-03-28 ENCOUNTER — Ambulatory Visit (INDEPENDENT_AMBULATORY_CARE_PROVIDER_SITE_OTHER): Payer: Medicare PPO

## 2023-03-28 DIAGNOSIS — I442 Atrioventricular block, complete: Secondary | ICD-10-CM

## 2023-03-29 LAB — CUP PACEART REMOTE DEVICE CHECK
Battery Remaining Longevity: 81 mo
Battery Voltage: 2.98 V
Brady Statistic AP VP Percent: 38.18 %
Brady Statistic AP VS Percent: 0.01 %
Brady Statistic AS VP Percent: 61.52 %
Brady Statistic AS VS Percent: 0.29 %
Brady Statistic RA Percent Paced: 37.96 %
Brady Statistic RV Percent Paced: 99.7 %
Date Time Interrogation Session: 20250214052021
Implantable Lead Connection Status: 753985
Implantable Lead Connection Status: 753985
Implantable Lead Implant Date: 20200306
Implantable Lead Implant Date: 20200306
Implantable Lead Location: 753859
Implantable Lead Location: 753860
Implantable Lead Model: 5076
Implantable Lead Model: 5076
Implantable Pulse Generator Implant Date: 20200306
Lead Channel Impedance Value: 304 Ohm
Lead Channel Impedance Value: 361 Ohm
Lead Channel Impedance Value: 361 Ohm
Lead Channel Impedance Value: 437 Ohm
Lead Channel Pacing Threshold Amplitude: 0.375 V
Lead Channel Pacing Threshold Amplitude: 0.5 V
Lead Channel Pacing Threshold Pulse Width: 0.4 ms
Lead Channel Pacing Threshold Pulse Width: 0.4 ms
Lead Channel Sensing Intrinsic Amplitude: 0.875 mV
Lead Channel Sensing Intrinsic Amplitude: 0.875 mV
Lead Channel Sensing Intrinsic Amplitude: 9.5 mV
Lead Channel Sensing Intrinsic Amplitude: 9.5 mV
Lead Channel Setting Pacing Amplitude: 1.5 V
Lead Channel Setting Pacing Amplitude: 2.5 V
Lead Channel Setting Pacing Pulse Width: 0.4 ms
Lead Channel Setting Sensing Sensitivity: 0.6 mV
Zone Setting Status: 755011
Zone Setting Status: 755011

## 2023-04-29 ENCOUNTER — Encounter: Payer: Self-pay | Admitting: Internal Medicine

## 2023-04-29 NOTE — Addendum Note (Signed)
 Addended by: Elease Etienne A on: 04/29/2023 10:18 AM   Modules accepted: Orders

## 2023-04-29 NOTE — Progress Notes (Signed)
 Remote pacemaker transmission.

## 2023-05-05 ENCOUNTER — Telehealth: Payer: Self-pay | Admitting: Licensed Clinical Social Worker

## 2023-05-05 NOTE — Telephone Encounter (Signed)
 H&V Care Navigation CSW Progress Note  Clinical Social Worker contacted caregiver by phone to complete assessment for pt and pt wife needs as challenges with getting to and from appts. Was able to speak with pt wife today Gary [name], [phone]). She shares pt in wheelchair mostly these days due to distance. They will ask neighbor to assist getting chair in car today but tomorrow will need assistance getting it out of the car and pt upstairs for appt. Pt daughter Gary [name] drives them but she is limited with getting DME out of the car. I have added it to appt notes and Gary [name], Gary [name], regarding this need. I also requested that pt wife call when they are here to see if staff are able to further assist. Will send all other requested resources to pt wife to review as she shares pt also having some dementia developing. No additional questions for pt at this time.   Patient is participating in a Managed Medicaid Plan:  No, [employer] Medicare  SDOH Screenings   Food Insecurity: No Food Insecurity (07/07/2019)  Transportation Needs: No Transportation Needs (07/07/2019)  Depression (PHQ2-9): Low Risk  (07/07/2019)  Financial Resource Strain: Low Risk  (07/07/2019)  Physical Activity: Sufficiently Active (07/07/2019)  Stress: No Stress Concern Present (07/07/2019)  Tobacco Use: Low Risk  (04/26/2022)    [name], MSW, LCSW Clinical Social Worker II [facility] Health Heart/Vascular Care Navigation  [phone]- work cell phone (preferred) [phone]- desk phone

## 2023-05-06 ENCOUNTER — Ambulatory Visit: Payer: Medicare PPO | Admitting: Internal Medicine

## 2023-05-15 ENCOUNTER — Telehealth: Payer: Self-pay | Admitting: Licensed Clinical Social Worker

## 2023-05-15 NOTE — Telephone Encounter (Signed)
 H&V Care Navigation CSW Progress Note  Clinical Social Worker  received call from pt wife Elease Hashimoto  to update me that her husband is transitioning to hospice- enrollment appt on 4/9. She wanted me to be aware and requested I pass it on to care team. I will route this to Dr. Rennis Golden and Dr. Odessa Fleming clinical teams so they can be aware and if any additional recommendations. If any additional questions or care needs encouraged Ms. Flett to call the office as needed.  Patient is participating in a Managed Medicaid Plan:  No, [employer] Medicare  SDOH Screenings   Food Insecurity: No Food Insecurity (07/07/2019)  Transportation Needs: No Transportation Needs (07/07/2019)  Depression (PHQ2-9): Low Risk  (07/07/2019)  Financial Resource Strain: Low Risk  (07/07/2019)  Physical Activity: Sufficiently Active (07/07/2019)  Stress: No Stress Concern Present (07/07/2019)  Tobacco Use: Low Risk  (04/26/2022)    [name], MSW, LCSW Clinical Social Worker II [facility] Health Heart/Vascular Care Navigation  [phone]- work cell phone (preferred) [phone]- desk phone

## 2023-05-16 NOTE — Telephone Encounter (Signed)
 Routing to CMA for monitoring.

## 2023-05-19 ENCOUNTER — Telehealth: Payer: Self-pay | Admitting: Internal Medicine

## 2023-05-19 NOTE — Telephone Encounter (Signed)
 Spoke with pt wife, she reports the patient is weak and not able to walk or get to appointments. She reports the medical doctor has requested hospice to help with his care at home. She reports they are trying to keep him out of the hospital. Ask wife to call us if there is anything we can do for them and I will make dr hilty aware.

## 2023-05-19 NOTE — Telephone Encounter (Signed)
 Gary [name] is requesting a callback regarding her meeting with hospice on Wednesday for the pt. Please advise

## 2023-06-10 ENCOUNTER — Telehealth: Payer: Self-pay | Admitting: Licensed Clinical Social Worker

## 2023-06-10 NOTE — Telephone Encounter (Signed)
 H&V Care Navigation CSW Progress Note  Clinical Social Worker received a call from pt wife Tron Brenden. She shares although pt has been declining both in memory and mobility that he did not meet criteria for full hospice care therefore they are trying to keep appointments as needed. PCP team is still visiting him at home currently but they weren't sure if he needed to reschedule his appointment for device check. If so then he will need wheelchair Carloyn Chi transport to clinic. LCSW will f/u with pt provider team and see if appt needed. If so once scheduled we can assist pt with ride to appt as he does not have transportation benefits per [facility] when contacted today. Pt wife aware and will reach out with any additional questions. Some of her concerns currently are just making sure that he does not fall as he has had several recently. They cannot currently afford the minimum number of hours required by most home care agencies and pt is not Medicaid eligible. Encouraged her to reach out to PCP team regarding any additional options they may have as they do home visits- to make reasonable accommodations to home for safety. Provided verbal support for stress that all these changes have had for pt wife. Remain available to assist with transportation request once appt scheduled.   Patient is participating in a Managed Medicaid Plan:  No, [employer] Medicare only  SDOH Screenings   Food Insecurity: No Food Insecurity (07/07/2019)  Transportation Needs: No Transportation Needs (07/07/2019)  Depression (PHQ2-9): Low Risk  (07/07/2019)  Financial Resource Strain: Low Risk  (07/07/2019)  Physical Activity: Sufficiently Active (07/07/2019)  Stress: No Stress Concern Present (07/07/2019)  Tobacco Use: Low Risk  (04/26/2022)     Nathen Balder, MSW, LCSW Clinical Social Worker II [facility] Health Heart/Vascular Care Navigation  [phone]- work cell phone (preferred)

## 2023-06-16 ENCOUNTER — Telehealth: Payer: Self-pay | Admitting: Licensed Clinical Social Worker

## 2023-06-16 NOTE — Telephone Encounter (Signed)
 H&V Care Navigation CSW Progress Note  Clinical Social Worker contacted patient by phone to f/u on assistance with transportation to appt. Note nothing scheduled at this time. Left voicemail for pt/pt wife Lorelee Roger at [phone]. She returned my call and we discussed when he has been called for his appt to give me a call back for transportation. Will do my best to follow. Pt celebrating his birthday today, church members plan to bring cake. Remain available for any additional questions/concerns.   Patient is participating in a Managed Medicaid Plan:  No [employer] Medicare only  SDOH Screenings   Food Insecurity: No Food Insecurity (07/07/2019)  Transportation Needs: No Transportation Needs (07/07/2019)  Depression (PHQ2-9): Low Risk  (07/07/2019)  Financial Resource Strain: Low Risk  (07/07/2019)  Physical Activity: Sufficiently Active (07/07/2019)  Stress: No Stress Concern Present (07/07/2019)  Tobacco Use: Low Risk  (04/26/2022)    Nathen Balder, MSW, LCSW Clinical Social Worker II [facility] Health Heart/Vascular Care Navigation  [phone]- work cell phone (preferred)

## 2023-06-17 ENCOUNTER — Telehealth: Payer: Self-pay | Admitting: Licensed Clinical Social Worker

## 2023-06-17 NOTE — Telephone Encounter (Signed)
 H&V Care Navigation CSW Progress Note  Clinical Social Worker contacted patient by phone to f/u on transportation needs. No answer today at [phone], left voicemail for pt and pt wife. LCSW had been informed that they may be able to get a ride through [employer] per scheduler. Remain available as needed. Will re-attempt pt wife if I do not hear back from them before end of week.  Patient is participating in a Managed Medicaid Plan:  No, [employer] Medicare  SDOH Screenings   Food Insecurity: No Food Insecurity (07/07/2019)  Transportation Needs: No Transportation Needs (07/07/2019)  Depression (PHQ2-9): Low Risk  (07/07/2019)  Financial Resource Strain: Low Risk  (07/07/2019)  Physical Activity: Sufficiently Active (07/07/2019)  Stress: No Stress Concern Present (07/07/2019)  Tobacco Use: Low Risk  (04/26/2022)     Nathen Balder, MSW, LCSW Clinical Social Worker II [facility] Health Heart/Vascular Care Navigation  [phone]- work cell phone (preferred)

## 2023-06-19 ENCOUNTER — Telehealth (HOSPITAL_BASED_OUTPATIENT_CLINIC_OR_DEPARTMENT_OTHER): Payer: Self-pay | Admitting: Licensed Clinical Social Worker

## 2023-06-19 NOTE — Telephone Encounter (Signed)
 H&V Care Navigation CSW Progress Note  Clinical Social Worker contacted patient by phone to f/u on transportation to rescheduled appt. Was able to reach pt wife Lorelee Roger who returned my call. She shares they are working with Risk analyst now for in home aides and transportation. Since he has become weaker they are trying to figure out how pt could be transferred to wheelchair for transportation with [employer] assistance. Pt wife isnt sure what the outcome will be, she is agreeable to me f/u on June 2nd and at that time we can see what plans may need to be made. Pt wife feels good with [employer] and feels she may be able to get her appts at [facility] also rescheduled b/c he will have supervision. LCSW confirmed she is aware of how to contact clinic to get rescheduled. Remain available should pt wife have concerns before 6/2.   Patient is participating in a Managed Medicaid Plan:  No, [employer] Medicare  SDOH Screenings   Food Insecurity: No Food Insecurity (07/07/2019)  Transportation Needs: No Transportation Needs (07/07/2019)  Depression (PHQ2-9): Low Risk  (07/07/2019)  Financial Resource Strain: Low Risk  (07/07/2019)  Physical Activity: Sufficiently Active (07/07/2019)  Stress: No Stress Concern Present (07/07/2019)  Tobacco Use: Low Risk  (04/26/2022)    Nathen Balder, MSW, LCSW Clinical Social Worker II [facility] Health Heart/Vascular Care Navigation  [phone]- work cell phone (preferred)

## 2023-06-27 ENCOUNTER — Ambulatory Visit (INDEPENDENT_AMBULATORY_CARE_PROVIDER_SITE_OTHER): Payer: Medicare PPO

## 2023-06-27 DIAGNOSIS — I442 Atrioventricular block, complete: Secondary | ICD-10-CM | POA: Diagnosis not present

## 2023-06-27 LAB — CUP PACEART REMOTE DEVICE CHECK
Battery Remaining Longevity: 77 mo
Battery Voltage: 2.97 V
Brady Statistic AP VP Percent: 52.48 %
Brady Statistic AP VS Percent: 0.01 %
Brady Statistic AS VP Percent: 47.14 %
Brady Statistic AS VS Percent: 0.37 %
Brady Statistic RA Percent Paced: 52.28 %
Brady Statistic RV Percent Paced: 99.62 %
Date Time Interrogation Session: 20250515234008
Implantable Lead Connection Status: 753985
Implantable Lead Connection Status: 753985
Implantable Lead Implant Date: 20200306
Implantable Lead Implant Date: 20200306
Implantable Lead Location: 753859
Implantable Lead Location: 753860
Implantable Lead Model: 5076
Implantable Lead Model: 5076
Implantable Pulse Generator Implant Date: 20200306
Lead Channel Impedance Value: 304 Ohm
Lead Channel Impedance Value: 361 Ohm
Lead Channel Impedance Value: 380 Ohm
Lead Channel Impedance Value: 456 Ohm
Lead Channel Pacing Threshold Amplitude: 0.375 V
Lead Channel Pacing Threshold Amplitude: 0.5 V
Lead Channel Pacing Threshold Pulse Width: 0.4 ms
Lead Channel Pacing Threshold Pulse Width: 0.4 ms
Lead Channel Sensing Intrinsic Amplitude: 1.125 mV
Lead Channel Sensing Intrinsic Amplitude: 1.125 mV
Lead Channel Sensing Intrinsic Amplitude: 3.25 mV
Lead Channel Sensing Intrinsic Amplitude: 3.25 mV
Lead Channel Setting Pacing Amplitude: 1.5 V
Lead Channel Setting Pacing Amplitude: 2.5 V
Lead Channel Setting Pacing Pulse Width: 0.4 ms
Lead Channel Setting Sensing Sensitivity: 0.6 mV
Zone Setting Status: 755011
Zone Setting Status: 755011

## 2023-06-29 ENCOUNTER — Ambulatory Visit: Payer: Self-pay | Admitting: Cardiology

## 2023-07-14 ENCOUNTER — Encounter (HOSPITAL_COMMUNITY): Payer: Self-pay

## 2023-07-14 ENCOUNTER — Other Ambulatory Visit: Payer: Self-pay

## 2023-07-14 ENCOUNTER — Emergency Department (HOSPITAL_COMMUNITY)

## 2023-07-14 ENCOUNTER — Emergency Department (HOSPITAL_COMMUNITY)
Admission: EM | Admit: 2023-07-14 | Discharge: 2023-07-14 | Disposition: A | Discharge status: Home / Self Care | Attending: Emergency Medicine | Admitting: Emergency Medicine

## 2023-07-14 DIAGNOSIS — R5383 Other fatigue: Secondary | ICD-10-CM | POA: Diagnosis present

## 2023-07-14 DIAGNOSIS — Z950 Presence of cardiac pacemaker: Secondary | ICD-10-CM | POA: Diagnosis not present

## 2023-07-14 DIAGNOSIS — Z91040 Latex allergy status: Secondary | ICD-10-CM | POA: Insufficient documentation

## 2023-07-14 DIAGNOSIS — R519 Headache, unspecified: Secondary | ICD-10-CM | POA: Insufficient documentation

## 2023-07-14 DIAGNOSIS — Z79899 Other long term (current) drug therapy: Secondary | ICD-10-CM | POA: Insufficient documentation

## 2023-07-14 DIAGNOSIS — N189 Chronic kidney disease, unspecified: Secondary | ICD-10-CM | POA: Insufficient documentation

## 2023-07-14 DIAGNOSIS — R197 Diarrhea, unspecified: Secondary | ICD-10-CM | POA: Insufficient documentation

## 2023-07-14 DIAGNOSIS — I129 Hypertensive chronic kidney disease with stage 1 through stage 4 chronic kidney disease, or unspecified chronic kidney disease: Secondary | ICD-10-CM | POA: Insufficient documentation

## 2023-07-14 DIAGNOSIS — I2510 Atherosclerotic heart disease of native coronary artery without angina pectoris: Secondary | ICD-10-CM | POA: Insufficient documentation

## 2023-07-14 DIAGNOSIS — F0390 Unspecified dementia without behavioral disturbance: Secondary | ICD-10-CM | POA: Insufficient documentation

## 2023-07-14 DIAGNOSIS — R600 Localized edema: Secondary | ICD-10-CM | POA: Insufficient documentation

## 2023-07-14 DIAGNOSIS — Z7982 Long term (current) use of aspirin: Secondary | ICD-10-CM | POA: Diagnosis not present

## 2023-07-14 DIAGNOSIS — R404 Transient alteration of awareness: Secondary | ICD-10-CM | POA: Insufficient documentation

## 2023-07-14 LAB — URINALYSIS, W/ REFLEX TO CULTURE (INFECTION SUSPECTED)
Bacteria, UA: NONE SEEN
Bilirubin Urine: NEGATIVE
Glucose, UA: NEGATIVE mg/dL
Hgb urine dipstick: NEGATIVE
Ketones, ur: NEGATIVE mg/dL
Nitrite: NEGATIVE
Protein, ur: NEGATIVE mg/dL
Specific Gravity, Urine: 1.01 (ref 1.005–1.030)
pH: 5 (ref 5.0–8.0)

## 2023-07-14 LAB — COMPREHENSIVE METABOLIC PANEL WITH GFR
ALT: 8 U/L (ref 0–44)
AST: 17 U/L (ref 15–41)
Albumin: 3.5 g/dL (ref 3.5–5.0)
Alkaline Phosphatase: 28 U/L — ABNORMAL LOW (ref 38–126)
Anion gap: 9 (ref 5–15)
BUN: 23 mg/dL (ref 8–23)
CO2: 26 mmol/L (ref 22–32)
Calcium: 9.1 mg/dL (ref 8.9–10.3)
Chloride: 104 mmol/L (ref 98–111)
Creatinine, Ser: 1.98 mg/dL — ABNORMAL HIGH (ref 0.61–1.24)
GFR, Estimated: 32 mL/min — ABNORMAL LOW (ref >60)
Glucose, Bld: 150 mg/dL — ABNORMAL HIGH (ref 70–99)
Potassium: 3.8 mmol/L (ref 3.5–5.1)
Sodium: 139 mmol/L (ref 135–145)
Total Bilirubin: 0.7 mg/dL (ref 0.0–1.2)
Total Protein: 5.7 g/dL — ABNORMAL LOW (ref 6.5–8.1)

## 2023-07-14 LAB — CBC WITH DIFFERENTIAL/PLATELET
Abs Immature Granulocytes: 0.05 10*3/uL (ref 0.00–0.07)
Basophils Absolute: 0 10*3/uL (ref 0.0–0.1)
Basophils Relative: 1 %
Eosinophils Absolute: 0.3 10*3/uL (ref 0.0–0.5)
Eosinophils Relative: 5 %
HCT: 35.6 % — ABNORMAL LOW (ref 39.0–52.0)
Hemoglobin: 11.5 g/dL — ABNORMAL LOW (ref 13.0–17.0)
Immature Granulocytes: 1 %
Lymphocytes Relative: 14 %
Lymphs Abs: 0.9 10*3/uL (ref 0.7–4.0)
MCH: 29.8 pg (ref 26.0–34.0)
MCHC: 32.3 g/dL (ref 30.0–36.0)
MCV: 92.2 fL (ref 80.0–100.0)
Monocytes Absolute: 0.6 10*3/uL (ref 0.1–1.0)
Monocytes Relative: 9 %
Neutro Abs: 4.6 10*3/uL (ref 1.7–7.7)
Neutrophils Relative %: 70 %
Platelets: 138 10*3/uL — ABNORMAL LOW (ref 150–400)
RBC: 3.86 MIL/uL — ABNORMAL LOW (ref 4.22–5.81)
RDW: 13.1 % (ref 11.5–15.5)
WBC: 6.4 10*3/uL (ref 4.0–10.5)
nRBC: 0 % (ref 0.0–0.2)

## 2023-07-14 LAB — TSH: TSH: 3.125 u[IU]/mL (ref 0.350–4.500)

## 2023-07-14 LAB — TROPONIN I (HIGH SENSITIVITY)
Troponin I (High Sensitivity): 23 ng/L — ABNORMAL HIGH (ref <18)
Troponin I (High Sensitivity): 24 ng/L — ABNORMAL HIGH (ref <18)

## 2023-07-14 LAB — AMMONIA: Ammonia: 17 umol/L (ref 9–35)

## 2023-07-14 LAB — BRAIN NATRIURETIC PEPTIDE: B Natriuretic Peptide: 173.7 pg/mL — ABNORMAL HIGH (ref 0.0–100.0)

## 2023-07-14 NOTE — ED Triage Notes (Signed)
 Pt presents to ED via EMS from home with complaint of being weak since 0400 this AM. SOB, Headache. Left sided weakness per wife x6 months. RA 87%, 98% on 2L [place].  Pt does not wear oxygen at baseline. Clear lung sounds. PCP wanting to refer pt to hospice for failure to thrive but that has not been done yet. HX of dementia, wife reports that he appears to be more confused than normal baseline for pt.

## 2023-07-14 NOTE — ED Notes (Signed)
Pace maker interrogated. [employer].

## 2023-07-14 NOTE — ED Provider Notes (Signed)
 [place] EMERGENCY DEPARTMENT AT [facility] Provider Note   CSN: [id number] Arrival date & time: 07/14/23  1914     History  Chief Complaint  Patient presents with   Weakness    Gary [name] is a 89 y.o. male.  The history is provided by the patient and medical records (ems erport to nursing). No language interpreter was used.  Weakness Severity:  Unable to specify Onset quality:  Unable to specify Timing:  Unable to specify Progression:  Unable to specify Chronicity:  Chronic Relieved by:  Nothing Worsened by:  Nothing Ineffective treatments:  None tried Associated symptoms: diarrhea, headaches and shortness of breath (resolved now)   Associated symptoms: no abdominal pain, no aphasia, no chest pain, no cough, no dizziness, no numbness in extremities, no fever, no foul-smelling urine, no frequency, no lethargy, no loss of consciousness, no melena, no nausea, no near-syncope, no seizures, no sensory-motor deficit and no vomiting        Home Medications Prior to Admission medications   Medication Sig Start Date End Date Taking? Authorizing Provider  acetaminophen  (TYLENOL ) 500 MG tablet Take 1,000 mg by mouth every 8 (eight) hours as needed for headache (pain).    [provider]  amLODipine  (NORVASC ) 5 MG tablet TAKE 1 TABLET BY MOUTH EVERY DAY 10/30/22   Hilty, Aviva Lemmings, MD  aspirin  EC 81 MG tablet Take 81 mg by mouth every morning.    [provider]  blood glucose meter kit and supplies KIT Dispense based on patient and insurance preference. Use up to four times daily as directed. (FOR ICD-9 250.00, 250.01). 03/29/19   Enrigue Harvard, DO  carvedilol  (COREG ) 3.125 MG tablet TAKE 1 TABLET (3.125 MG TOTAL) BY MOUTH 2 (TWO) TIMES DAILY WITH A MEAL. 10/09/20   Hilty, Aviva Lemmings, MD  citalopram  (CELEXA ) 40 MG tablet Take 1 tablet (40 mg total) by mouth daily. 08/19/19   Susanna Epley, FNP  donepezil (ARICEPT) 5 MG tablet Take 5 mg by mouth at  bedtime. 04/02/22   [provider]  doxazosin  (CARDURA ) 4 MG tablet Take 4 mg by mouth at bedtime.    [provider]  fluticasone  (FLONASE ) 50 MCG/ACT nasal spray Place 2 sprays into both nostrils daily.    [provider]  furosemide  (LASIX ) 40 MG tablet Take 1 tablet (40 mg total) by mouth daily. 02/27/22   Hilty, Aviva Lemmings, MD  metFORMIN (GLUCOPHAGE-XR) 500 MG 24 hr tablet Take 500 mg by mouth daily. 04/22/22   [provider]  nitroGLYCERIN  (NITROSTAT ) 0.4 MG SL tablet PLACE 1 TABLET UNDER THE TONGUE EVERY 5 MINUTES AS NEEDED FOR CHEST PAIN FOR 3 DOSES 10/25/16   Hilty, Aviva Lemmings, MD  nystatin (MYCOSTATIN/NYSTOP) powder Apply topically 2 (two) times daily. 03/31/22   [provider]  omeprazole  (PRILOSEC) 40 MG capsule 1 capsule 30 minutes before morning meal Orally    [provider]  pregabalin  (LYRICA ) 75 MG capsule Take 1 capsule (75 mg total) by mouth 3 (three) times daily. 09/08/19   Moore, Janece, FNP  simvastatin  (ZOCOR ) 40 MG tablet TAKE 1 TABLET BY MOUTH AT BEDTIME 03/18/16   Hilty, Aviva Lemmings, MD  traMADol  (ULTRAM ) 50 MG tablet Take 50 mg by mouth 3 (three) times daily. 10/21/19   [provider]  vitamin B-12 1000 MCG tablet Take 1 tablet (1,000 mcg total) by mouth daily. 03/29/19   Vann, Jessica U, DO      Allergies    Naproxen,  Clopidogrel bisulfate, Other, Sulfonamide derivatives, Latex, and Tape    Review of Systems   Review of Systems  Constitutional:  Positive for fatigue. Negative for chills and fever.  HENT:  Negative for congestion.   Eyes:  Negative for visual disturbance.  Respiratory:  Positive for shortness of breath (resolved now). Negative for cough, chest tightness and wheezing.   Cardiovascular:  Negative for chest pain, palpitations, leg swelling and near-syncope.  Gastrointestinal:  Positive for diarrhea. Negative for abdominal pain, constipation, melena, nausea and vomiting.  Genitourinary:  Negative for  frequency.  Musculoskeletal:  Negative for back pain, neck pain and neck stiffness.  Skin:  Negative for rash and wound.  Neurological:  Positive for weakness, light-headedness and headaches. Negative for dizziness, seizures, loss of consciousness and syncope.  Psychiatric/Behavioral:  Negative for agitation and confusion.   All other systems reviewed and are negative.   Physical Exam Updated Vital Signs BP (!) 149/65 (BP Location: Right Arm)   Pulse 65   Temp 98.4 F (36.9 C) (Oral)   Resp 18   Ht 5\' 3"  (1.6 m)   Wt 90.7 kg   SpO2 98% Comment: 2L; 87% on RA  BMI 35.43 kg/m  Physical Exam Vitals and nursing note reviewed.  Constitutional:      General: He is not in acute distress.    Appearance: He is well-developed. He is not ill-appearing, toxic-appearing or diaphoretic.  HENT:     Head: Normocephalic and atraumatic.     Nose: No congestion or rhinorrhea.     Mouth/Throat:     Mouth: Mucous membranes are moist.     Pharynx: No oropharyngeal exudate or posterior oropharyngeal erythema.  Eyes:     Conjunctiva/sclera: Conjunctivae normal.     Comments: E eye is looking outward laterally and he reports he does not see out of his right eye.  He reports this is chronic.  Cardiovascular:     Rate and Rhythm: Normal rate and regular rhythm.     Pulses: Normal pulses.     Heart sounds: No murmur heard. Pulmonary:     Effort: Pulmonary effort is normal. No respiratory distress.     Breath sounds: Rales present. No wheezing or rhonchi.  Chest:     Chest wall: No tenderness.  Abdominal:     Palpations: Abdomen is soft.     Tenderness: There is no abdominal tenderness. There is no right CVA tenderness, left CVA tenderness or guarding.  Musculoskeletal:        General: No swelling or tenderness.     Cervical back: Neck supple. No tenderness.     Right lower leg: Edema present.     Left lower leg: Edema present.  Skin:    General: Skin is warm and dry.     Capillary Refill:  Capillary refill takes less than 2 seconds.     Findings: No rash.  Neurological:     Mental Status: He is alert.     Sensory: No sensory deficit.     Motor: No weakness.  Psychiatric:        Mood and Affect: Mood normal.     ED Results / Procedures / Treatments   Labs (all labs ordered are listed, but only abnormal results are displayed) Labs Reviewed  CBC WITH DIFFERENTIAL/PLATELET - Abnormal; Notable for the following components:      Result Value   RBC 3.86 (*)    Hemoglobin 11.5 (*)    HCT 35.6 (*)    Platelets  138 (*)    All other components within normal limits  COMPREHENSIVE METABOLIC PANEL WITH GFR - Abnormal; Notable for the following components:   Glucose, Bld 150 (*)    Creatinine, Ser 1.98 (*)    Total Protein 5.7 (*)    Alkaline Phosphatase 28 (*)    GFR, Estimated 32 (*)    All other components within normal limits  BRAIN NATRIURETIC PEPTIDE - Abnormal; Notable for the following components:   B Natriuretic Peptide 173.7 (*)    All other components within normal limits  URINALYSIS, W/ REFLEX TO CULTURE (INFECTION SUSPECTED) - Abnormal; Notable for the following components:   Color, Urine STRAW (*)    Leukocytes,Ua TRACE (*)    All other components within normal limits  TROPONIN I (HIGH SENSITIVITY) - Abnormal; Notable for the following components:   Troponin I (High Sensitivity) 23 (*)    All other components within normal limits  TROPONIN I (HIGH SENSITIVITY) - Abnormal; Notable for the following components:   Troponin I (High Sensitivity) 24 (*)    All other components within normal limits  AMMONIA  TSH    EKG EKG Interpretation Date/Time:  Monday July 14 2023 08:15:06 EDT Ventricular Rate:  65 PR Interval:  275 QRS Duration:  196 QT Interval:  498 QTC Calculation: 518 R Axis:   -73  Text Interpretation: Sinus rhythm Prolonged PR interval LVH with IVCD, LAD and secondary repol abnrm Prolonged QT interval when compared to prior, similar  appearance NO STEMI Confirmed by Wynell Heath ([id number]) on 07/14/2023 8:18:32 AM  Radiology CT HEAD WO CONTRAST ( ) Result Date: 07/14/2023 CLINICAL DATA:  89 year old male with headache and weakness. Left side weakness, increased confusion from baseline. EXAM: CT HEAD WITHOUT CONTRAST TECHNIQUE: Contiguous axial images were obtained from the base of the skull through the vertex without intravenous contrast. RADIATION DOSE REDUCTION: This exam was performed according to the departmental dose-optimization program which includes automated exposure control, adjustment of the mA and/or kV according to patient size and/or use of iterative reconstruction technique. COMPARISON:  Head CT 03/27/2019. FINDINGS: Brain: Cerebral volume is not significantly changed since 2021, and volume loss appears fairly generalized. No midline shift, ventriculomegaly, mass effect, evidence of mass lesion, intracranial hemorrhage or evidence of cortically based acute infarction. Mild for age scattered cerebral white matter hypodensity. No cortical encephalomalacia identified. Vascular: Extensive Calcified atherosclerosis at the skull base. No suspicious intracranial vascular hyperdensity. Skull: Intact.  No acute osseous abnormality identified. Sinuses/Orbits: Visualized paranasal sinuses and mastoids are stable and well aerated. Other: Calcified scalp vessel atherosclerosis. No acute orbit or scalp soft tissue finding. IMPRESSION: 1. No acute intracranial abnormality. 2. Stable since 2021 and largely unremarkable for age noncontrast CT appearance of the brain. Electronically Signed   By: Marlise Simpers M.D.   On: 07/14/2023 10:28   DG Chest Portable 1 View Result Date: 07/14/2023 CLINICAL DATA:  Rales in lung bases EXAM: PORTABLE CHEST 1 VIEW COMPARISON:  August 11, 2021 FINDINGS: No change in the left subclavian bipolar ICDF pacemaker device tip of the leads in good position no evidence of discontinuity Basilar hypoventilatory atelectasis  without infiltrates consolidations or acute pulmonary edema No pleural effusions or reactions overall no significant change since prior examination. IMPRESSION: No acute infiltrates with left lower lobe persistent hypoventilatory atelectasis Electronically Signed   By: Fredrich Jefferson M.D.   On: 07/14/2023 09:15    Procedures Procedures    Medications Ordered in ED Medications - No data to display  ED Course/  Medical Decision Making/ A&P                                 Medical Decision Making Amount and/or Complexity of Data Reviewed Labs: ordered. Radiology: ordered.    Gary COBY is a 89 y.o. male with a past medical history significant for hypertension, dyslipidemia, CKD, CAD with previous MI, complete heart block with pacemaker, previous subtotal colectomy, previous kidney stones, dementia per EMS report, and previous paroxysmal atrial fibrillation not on anticoagulation who presents for headache, possible shortness of breath with transient hypoxia, and fatigue.  According to patient, he just has headache today and fatigue but he is denying shortness of breath.  He reportedly told EMS he had shortness of breath and oxygen saturations were 87% on room air initially.  He was on 2 L during transport but is now back on room air and oxygen saturations are in the 90s.  He denies fevers, chills, congestion, cough, nausea, vomiting, constipation, or urinary changes.  He does report some mild diarrhea.  Reports no change in his chronic leg swelling.  He reportedly also has a history of dementia per EMS and that the wife said he was more confused than his normal self.  On my exam, patient is alert.  Patient is answering questions appropriately.  He says that he just feels tired and has some headache that is moderate.  He reports no other complaints initially just about shortness of breath.  On my exam, he does have some rales in the bases of his lungs but his chest is nontender.  Back and neck  are nontender.  Abdomen is nontender.  He has edema in both legs that he reports is chronic.  Intact sensation and strength in arms and legs but he has a left facial droop that he reports is chronic.  Per EMS he has some chronic left-sided weakness but he does not feel that he is weak at this time.  Given the patient's fatigue and headache we will get a CT of the head.  Will get screening labs.  Will get workup to look for occult infection and will get a chest x-ray with the hypoxia transiently with shortness of breath and will get a urinalysis to look for UTI.  Will get other screening labs as well.  Anticipate reassessment after family arrives for further discussion about how he is different than his baseline and reassessment after his workup to determine disposition.  3:16 PM Patient resting comfortably and has no complaints now.  Family arrived and said he is looking better.  She says that she almost did not want to bring him but is reassured based on all the labs we went through together.  Patient feels safe going home and following with his PCP team.  He is thinking clearly now and has no complaints.  We did discuss that his creatinine was higher than before but patient does feel able to orally hydrate at home.  Given reassuring workup, will discharge for outpatient follow-up.  They agree with plan of care.  Patient discharged in good condition.         Final Clinical Impression(s) / ED Diagnoses Final diagnoses:  Fatigue, unspecified type  Altered awareness, transient    Rx / DC Orders ED Discharge Orders     None       Clinical Impression: 1. Fatigue, unspecified type   2. Altered awareness, transient  Disposition: Discharge  Condition: Good  I have discussed the results, Dx and Tx plan with the pt(& family if present). He/she/they expressed understanding and agree(s) with the plan. Discharge instructions discussed at great length. Strict return precautions  discussed and pt &/or family have verbalized understanding of the instructions. No further questions at time of discharge.    New Prescriptions   No medications on file    Follow Up: Sharma Dears, NP [address] [place] [address] [phone]     [facility] Emergency Department at [facility] [address] [place]  [id number] [phone]       Shareef Eddinger, Marine Sia, MD 07/14/23 [phone]

## 2023-07-14 NOTE — ED Notes (Signed)
 CCMD contacted

## 2023-07-14 NOTE — Discharge Instructions (Signed)
 Your history, exam, workup today appears similar to prior.  Your kidney function was slightly increased from prior so please increase your fluids.  We feel you are safe for discharge home given your reassuring assessment and your workup that did not show any findings that would require admission at this time.  We had a shared decision-making conversation and agree with discharge home.  Please follow-up with your primary doctor and rest.  If any symptoms change or worsen acutely, please return to the nearest emergency department.

## 2023-07-15 ENCOUNTER — Telehealth: Payer: Self-pay | Admitting: Licensed Clinical Social Worker

## 2023-07-15 ENCOUNTER — Telehealth: Payer: Self-pay | Admitting: Internal Medicine

## 2023-07-15 NOTE — Telephone Encounter (Signed)
 Pts wife would like his upcoming appt on June 10th to be remote due to pt going to hospice. Please advise

## 2023-07-15 NOTE — Telephone Encounter (Signed)
 H&V Care Navigation CSW Progress Note  Clinical Social Worker contacted caregiver by phone to f/u on upcoming appt as discussed. LCSW was able to reach pt wife Lorelee Roger, [place] on file. Pt went to hospital yesterday pt wife shares they think there has been a shift in his mentation and he has been much weaker. They werent able to get [employer] assistance at this time- have church members rotating in to provide assistance at this time. They have reached out to palliative, they aren't sure if pt had device checked at hospital so they will reach out to clinic triage to ask and see if f/u in person still needed. If I do not hear back from them I will f/u Friday for transportation. Pt wife agreeable.   Patient is participating in a Managed Medicaid Plan:  No, [employer] Medicare  SDOH Screenings   Food Insecurity: No Food Insecurity (07/07/2019)  Transportation Needs: No Transportation Needs (07/07/2019)  Depression (PHQ2-9): Low Risk  (07/07/2019)  Financial Resource Strain: Low Risk  (07/07/2019)  Physical Activity: Sufficiently Active (07/07/2019)  Stress: No Stress Concern Present (07/07/2019)  Tobacco Use: Low Risk  (07/14/2023)    Nathen Balder, MSW, LCSW Clinical Social Worker II [facility] Heart/Vascular Care Navigation  [phone]- work cell phone (preferred)

## 2023-07-16 ENCOUNTER — Telehealth: Payer: Self-pay | Admitting: Physician Assistant

## 2023-07-16 NOTE — Telephone Encounter (Signed)
 Spoke to patient's spouse and patient was discharged 6/2 from hospital and will be eval by Hospice on 6/9 .Wife requesting  to see if  appointment on 6/10 for pacemaker check can be changed or can pacemaker be remote check. Made spouse aware that someone will be reaching out.

## 2023-07-16 NOTE — Telephone Encounter (Signed)
 Apt needs to be changed for in person.

## 2023-07-16 NOTE — Telephone Encounter (Signed)
 Appointment for EP APP for 6/10 has been canceled. Per renee - will revisit need in 6 months on ability to come in.

## 2023-07-16 NOTE — Telephone Encounter (Signed)
 Please see Renee's note.

## 2023-07-16 NOTE — Telephone Encounter (Signed)
 Spouse requesting cb to discuss upcoming appt, if can be done remotely due to pt's condition and having to be transported by ambulance

## 2023-07-18 NOTE — Telephone Encounter (Signed)
 H&V Care Navigation CSW Progress Note  Clinical Social Worker contacted caregiver by phone to f/u on noted appt change. Was able to reach pt wife Gary [name] (DPR on file) at [phone] this morning. She confirmed appt date change, ongoing support from pt PCP as they still are in between palliative and full hospice being initiated. LCSW encouraged pt wife to call me as needed moving forward should she have any questions or need additional assistance from our team. No additional questions at this time.  Patient is participating in a Managed Medicaid Plan:  No, [employer] Medicare only  SDOH Screenings   Food Insecurity: No Food Insecurity (07/07/2019)  Transportation Needs: No Transportation Needs (07/07/2019)  Depression (PHQ2-9): Low Risk  (07/07/2019)  Financial Resource Strain: Low Risk  (07/07/2019)  Physical Activity: Sufficiently Active (07/07/2019)  Stress: No Stress Concern Present (07/07/2019)  Tobacco Use: Low Risk  (07/14/2023)   Nathen Balder, MSW, LCSW Clinical Social Worker II [facility] Health Heart/Vascular Care Navigation  [phone]- work cell phone (preferred)

## 2023-07-22 ENCOUNTER — Ambulatory Visit: Admitting: Physician Assistant

## 2023-07-31 ENCOUNTER — Ambulatory Visit: Admitting: Podiatry

## 2023-08-04 NOTE — Progress Notes (Signed)
 Remote pacemaker transmission.

## 2023-08-20 ENCOUNTER — Ambulatory Visit: Admitting: Podiatry

## 2023-08-20 ENCOUNTER — Encounter: Payer: Self-pay | Admitting: Podiatry

## 2023-08-20 DIAGNOSIS — N1832 Chronic kidney disease, stage 3b: Secondary | ICD-10-CM

## 2023-08-20 DIAGNOSIS — B351 Tinea unguium: Secondary | ICD-10-CM

## 2023-08-20 DIAGNOSIS — M79675 Pain in left toe(s): Secondary | ICD-10-CM | POA: Diagnosis not present

## 2023-08-20 DIAGNOSIS — M79674 Pain in right toe(s): Secondary | ICD-10-CM | POA: Diagnosis not present

## 2023-08-20 DIAGNOSIS — D696 Thrombocytopenia, unspecified: Secondary | ICD-10-CM | POA: Diagnosis not present

## 2023-08-20 NOTE — Progress Notes (Signed)
 This patient returns to my office for at risk foot care.  This patient requires this care by a professional since this patient will be at risk due to having thrombocytopenia and CKD. This patient is unable to cut nails himself since the patient cannot reach his nails.These nails are painful walking and wearing shoes.  This patient presents for at risk foot care today.  General Appearance  Alert, conversant and in no acute stress.  Vascular  Dorsalis pedis and posterior tibial  pulses are  weakly palpable  bilaterally.  Capillary return is within normal limits  bilaterally. Temperature is within normal limits  bilaterally.  Neurologic  Senn-Weinstein monofilament wire test within normal limits  bilaterally. Muscle power within normal limits bilaterally.  Nails Thick disfigured discolored nails with subungual debris  from hallux to fifth toes bilaterally. No evidence of bacterial infection or drainage bilaterally.  Orthopedic  No limitations of motion  feet .  No crepitus or effusions noted.  No bony pathology or digital deformities noted.  Skin  normotropic skin with no porokeratosis noted bilaterally.  No signs of infections or ulcers noted.     Onychomycosis  Pain in right toes  Pain in left toes  IE.  Consent was obtained for treatment procedures.   Mechanical debridement of nails 1-5  bilaterally performed with a nail nipper.  Filed with dremel without incident.    Return office visit    3 months                  Told patient to return for periodic foot care and evaluation due to potential at risk complications.   Cordella Bold DPM

## 2023-09-17 ENCOUNTER — Ambulatory Visit

## 2023-09-17 DIAGNOSIS — R2681 Unsteadiness on feet: Secondary | ICD-10-CM

## 2023-09-17 DIAGNOSIS — R293 Abnormal posture: Secondary | ICD-10-CM

## 2023-09-17 DIAGNOSIS — R2689 Other abnormalities of gait and mobility: Secondary | ICD-10-CM

## 2023-09-17 DIAGNOSIS — M6281 Muscle weakness (generalized): Secondary | ICD-10-CM

## 2023-09-17 NOTE — Therapy (Signed)
 [facility] [address] [place], [place], [address] Phone: [phone]   Fax:  [phone]  Patient Details  Name: DEPAUL ARIZPE MRN: [id number] Date of Birth: 20-Jul-1932 Referring Provider:  Thermon Almarie Colin Norment, FNP  Encounter Date: 09/17/2023  Patient arrived to PT eval with adult dtr, who remained in lobby. He walks with RW, forward flexed posture, slow gait speed, minimal foot clearance. Patient reports difficulty maneuvering around his house and has a goal of feeling safer doing so. PT recommending HH PT at this time to work toward clinical goals of safer ambulation within the home. Patient and dtr agreeable to this. No skilled OP PT services warranted at this time.   Delon DELENA Pop, PT Delon DELENA Pop, PT, DPT, CBIS  09/17/2023, 3:19 PM   [facility] [address] [place], [place], [address] Phone: [phone]   Fax:  [phone]

## 2023-09-26 ENCOUNTER — Ambulatory Visit (INDEPENDENT_AMBULATORY_CARE_PROVIDER_SITE_OTHER): Payer: Medicare PPO

## 2023-09-26 DIAGNOSIS — I442 Atrioventricular block, complete: Secondary | ICD-10-CM | POA: Diagnosis not present

## 2023-09-30 LAB — CUP PACEART REMOTE DEVICE CHECK
Battery Remaining Longevity: 70 mo
Battery Voltage: 2.97 V
Brady Statistic AP VP Percent: 54.21 %
Brady Statistic AP VS Percent: 0.01 %
Brady Statistic AS VP Percent: 45.53 %
Brady Statistic AS VS Percent: 0.26 %
Brady Statistic RA Percent Paced: 53.91 %
Brady Statistic RV Percent Paced: 99.73 %
Date Time Interrogation Session: 20250814212516
Implantable Lead Connection Status: 753985
Implantable Lead Connection Status: 753985
Implantable Lead Implant Date: 20200306
Implantable Lead Implant Date: 20200306
Implantable Lead Location: 753859
Implantable Lead Location: 753860
Implantable Lead Model: 5076
Implantable Lead Model: 5076
Implantable Pulse Generator Implant Date: 20200306
Lead Channel Impedance Value: 304 Ohm
Lead Channel Impedance Value: 342 Ohm
Lead Channel Impedance Value: 342 Ohm
Lead Channel Impedance Value: 399 Ohm
Lead Channel Pacing Threshold Amplitude: 0.5 V
Lead Channel Pacing Threshold Amplitude: 0.5 V
Lead Channel Pacing Threshold Pulse Width: 0.4 ms
Lead Channel Pacing Threshold Pulse Width: 0.4 ms
Lead Channel Sensing Intrinsic Amplitude: 1 mV
Lead Channel Sensing Intrinsic Amplitude: 1 mV
Lead Channel Sensing Intrinsic Amplitude: 5.875 mV
Lead Channel Sensing Intrinsic Amplitude: 5.875 mV
Lead Channel Setting Pacing Amplitude: 1.5 V
Lead Channel Setting Pacing Amplitude: 2.5 V
Lead Channel Setting Pacing Pulse Width: 0.4 ms
Lead Channel Setting Sensing Sensitivity: 0.6 mV
Zone Setting Status: 755011
Zone Setting Status: 755011

## 2023-10-03 ENCOUNTER — Ambulatory Visit: Payer: Self-pay | Admitting: Cardiology

## 2023-11-05 NOTE — Progress Notes (Signed)
 Remote PPM Transmission

## 2023-11-07 ENCOUNTER — Telehealth: Payer: Self-pay | Admitting: Cardiovascular Disease

## 2023-11-07 NOTE — Telephone Encounter (Signed)
 Called and spoke with the patient's wife. Advised she could just put the patient's transmitter box in the trash at home or she could bring it to the office and we would return it for her to [employer]. Per Mrs. Lubitz, she will just put this in the trash.  Mrs. Tufano expressed her thanks for all the great care her husband received.  Condolences offered. Mrs. Badalamenti was appreciative for the call back.

## 2023-11-07 NOTE — Telephone Encounter (Signed)
 Wife says patient passed away on 11-06-2023. Please advise on what to do with PPM.

## 2023-11-12 DEATH — deceased

## 2023-11-20 ENCOUNTER — Ambulatory Visit: Admitting: Podiatry
